# Patient Record
Sex: Female | Born: 1950 | Race: Black or African American | Hispanic: No | Marital: Single | Smoking: Former smoker
Health system: Southern US, Community
[De-identification: ages and names within clinical notes are randomized; demographics above are authoritative.]

## PROBLEM LIST (undated history)

## (undated) ENCOUNTER — Emergency Department (HOSPITAL_COMMUNITY): Discharge status: Absent without leave | Payer: Medicare Other

## (undated) DIAGNOSIS — E119 Type 2 diabetes mellitus without complications: Secondary | ICD-10-CM

## (undated) DIAGNOSIS — J45909 Unspecified asthma, uncomplicated: Secondary | ICD-10-CM

## (undated) DIAGNOSIS — E040 Nontoxic diffuse goiter: Secondary | ICD-10-CM

## (undated) DIAGNOSIS — I639 Cerebral infarction, unspecified: Secondary | ICD-10-CM

## (undated) DIAGNOSIS — E0590 Thyrotoxicosis, unspecified without thyrotoxic crisis or storm: Secondary | ICD-10-CM

## (undated) DIAGNOSIS — M1990 Unspecified osteoarthritis, unspecified site: Secondary | ICD-10-CM

## (undated) DIAGNOSIS — I10 Essential (primary) hypertension: Secondary | ICD-10-CM

## (undated) DIAGNOSIS — F0150 Vascular dementia without behavioral disturbance: Secondary | ICD-10-CM

## (undated) DIAGNOSIS — E049 Nontoxic goiter, unspecified: Secondary | ICD-10-CM

## (undated) DIAGNOSIS — E662 Morbid (severe) obesity with alveolar hypoventilation: Principal | ICD-10-CM

## (undated) DIAGNOSIS — E785 Hyperlipidemia, unspecified: Secondary | ICD-10-CM

## (undated) HISTORY — DX: Unspecified osteoarthritis, unspecified site: M19.90

## (undated) HISTORY — DX: Thyrotoxicosis, unspecified without thyrotoxic crisis or storm: E05.90

## (undated) HISTORY — DX: Nontoxic goiter, unspecified: E04.9

## (undated) HISTORY — DX: Nontoxic diffuse goiter: E04.0

## (undated) HISTORY — DX: Unspecified asthma, uncomplicated: J45.909

## (undated) HISTORY — DX: Essential (primary) hypertension: I10

## (undated) HISTORY — DX: Cerebral infarction, unspecified: I63.9

## (undated) HISTORY — DX: Morbid (severe) obesity with alveolar hypoventilation: E66.2

## (undated) HISTORY — PX: VENTRAL HERNIA REPAIR: SHX424

## (undated) HISTORY — DX: Type 2 diabetes mellitus without complications: E11.9

## (undated) HISTORY — DX: Hyperlipidemia, unspecified: E78.5

---

## 2009-05-22 ENCOUNTER — Inpatient Hospital Stay (HOSPITAL_COMMUNITY): Admission: EM | Admit: 2009-05-22 | Discharge: 2009-05-29 | Payer: Self-pay | Admitting: Emergency Medicine

## 2009-05-22 ENCOUNTER — Encounter (INDEPENDENT_AMBULATORY_CARE_PROVIDER_SITE_OTHER): Payer: Self-pay | Admitting: Surgery

## 2009-05-22 ENCOUNTER — Ambulatory Visit: Payer: Self-pay | Admitting: Cardiology

## 2009-05-22 ENCOUNTER — Ambulatory Visit: Payer: Self-pay | Admitting: Family Medicine

## 2009-05-23 ENCOUNTER — Encounter: Payer: Self-pay | Admitting: Family Medicine

## 2009-06-28 ENCOUNTER — Ambulatory Visit: Payer: Self-pay | Admitting: Internal Medicine

## 2009-06-28 DIAGNOSIS — R739 Hyperglycemia, unspecified: Secondary | ICD-10-CM

## 2009-06-28 DIAGNOSIS — E663 Overweight: Secondary | ICD-10-CM | POA: Insufficient documentation

## 2009-06-28 DIAGNOSIS — E0590 Thyrotoxicosis, unspecified without thyrotoxic crisis or storm: Secondary | ICD-10-CM | POA: Insufficient documentation

## 2009-06-28 DIAGNOSIS — R0602 Shortness of breath: Secondary | ICD-10-CM | POA: Insufficient documentation

## 2009-06-28 DIAGNOSIS — I2510 Atherosclerotic heart disease of native coronary artery without angina pectoris: Secondary | ICD-10-CM | POA: Insufficient documentation

## 2009-06-28 DIAGNOSIS — I10 Essential (primary) hypertension: Secondary | ICD-10-CM

## 2009-06-28 DIAGNOSIS — E785 Hyperlipidemia, unspecified: Secondary | ICD-10-CM | POA: Insufficient documentation

## 2009-07-03 ENCOUNTER — Inpatient Hospital Stay (HOSPITAL_COMMUNITY): Admission: EM | Admit: 2009-07-03 | Discharge: 2009-07-09 | Payer: Self-pay | Admitting: Emergency Medicine

## 2009-07-04 ENCOUNTER — Encounter (INDEPENDENT_AMBULATORY_CARE_PROVIDER_SITE_OTHER): Payer: Self-pay | Admitting: Internal Medicine

## 2009-07-05 ENCOUNTER — Encounter (INDEPENDENT_AMBULATORY_CARE_PROVIDER_SITE_OTHER): Payer: Self-pay | Admitting: Internal Medicine

## 2009-07-06 ENCOUNTER — Ambulatory Visit: Payer: Self-pay | Admitting: Physical Medicine & Rehabilitation

## 2009-07-06 ENCOUNTER — Encounter: Admission: RE | Admit: 2009-07-06 | Discharge: 2009-07-06 | Payer: Self-pay | Admitting: Internal Medicine

## 2009-07-09 ENCOUNTER — Inpatient Hospital Stay (HOSPITAL_COMMUNITY)
Admission: RE | Admit: 2009-07-09 | Discharge: 2009-07-21 | Payer: Self-pay | Admitting: Physical Medicine & Rehabilitation

## 2009-07-10 ENCOUNTER — Ambulatory Visit: Payer: Self-pay | Admitting: Physical Medicine & Rehabilitation

## 2009-08-16 ENCOUNTER — Encounter
Admission: RE | Admit: 2009-08-16 | Discharge: 2009-09-22 | Payer: Self-pay | Admitting: Physical Medicine & Rehabilitation

## 2009-09-21 ENCOUNTER — Ambulatory Visit: Payer: Self-pay | Admitting: Physical Medicine & Rehabilitation

## 2009-09-29 ENCOUNTER — Encounter
Admission: RE | Admit: 2009-09-29 | Discharge: 2009-11-29 | Payer: Self-pay | Admitting: Physical Medicine & Rehabilitation

## 2009-11-11 ENCOUNTER — Encounter
Admission: RE | Admit: 2009-11-11 | Discharge: 2009-11-16 | Payer: Self-pay | Admitting: Physical Medicine & Rehabilitation

## 2009-11-16 ENCOUNTER — Ambulatory Visit: Payer: Self-pay | Admitting: Physical Medicine & Rehabilitation

## 2010-02-04 ENCOUNTER — Encounter
Admission: RE | Admit: 2010-02-04 | Discharge: 2010-02-04 | Payer: Medicare Other | Admitting: Physical Medicine & Rehabilitation

## 2010-03-25 ENCOUNTER — Encounter: Payer: Self-pay | Admitting: Internal Medicine

## 2010-06-24 ENCOUNTER — Inpatient Hospital Stay (HOSPITAL_COMMUNITY)
Admission: EM | Admit: 2010-06-24 | Discharge: 2010-07-03 | Payer: Self-pay | Source: Home / Self Care | Admitting: Emergency Medicine

## 2010-09-06 ENCOUNTER — Ambulatory Visit (HOSPITAL_COMMUNITY)
Admission: RE | Admit: 2010-09-06 | Discharge: 2010-09-06 | Payer: Self-pay | Source: Home / Self Care | Attending: Obstetrics | Admitting: Obstetrics

## 2010-09-09 ENCOUNTER — Emergency Department (HOSPITAL_COMMUNITY)
Admission: EM | Admit: 2010-09-09 | Discharge: 2010-09-10 | Disposition: A | Discharge status: Other Acute Inpatient Hospital | Payer: Self-pay | Source: Home / Self Care | Admitting: Emergency Medicine

## 2010-09-10 ENCOUNTER — Inpatient Hospital Stay (HOSPITAL_COMMUNITY)
Admission: AD | Admit: 2010-09-10 | Discharge: 2010-09-13 | Payer: Self-pay | Attending: Obstetrics | Admitting: Obstetrics

## 2010-09-28 ENCOUNTER — Ambulatory Visit
Admission: RE | Admit: 2010-09-28 | Discharge: 2010-09-28 | Payer: Self-pay | Source: Home / Self Care | Attending: Gynecologic Oncology | Admitting: Gynecologic Oncology

## 2010-10-17 NOTE — Consult Note (Signed)
NAME:  Angelica Beck, Angelica Beck NO.:  [id number]  MEDICAL RECORD NO.:  [id number]          PATIENT TYPE:  INP  LOCATION:  3712                         FACILITY:  [facility]  PHYSICIAN:  Wilmon Arms. Corliss Skains, M.D. DATE OF BIRTH:  1950-09-29  DATE OF CONSULTATION:  06/24/2010 DATE OF DISCHARGE:                                CONSULTATION   REASON FOR EVALUATION:  Partial small bowel obstruction.  HISTORY OF PRESENT ILLNESS:  This is a 60 year old female with significant medical comorbidities who is status post emergent repair of incarcerated umbilical hernia on May 22, 2009, by Dr. Harriette Bouillon. The hernia was repaired with a 10- x 10-cm sheet of Proceed mesh.  The patient has had a 1-day history of abdominal pain in her right side associated with nausea and vomiting.  Last bowel movement was 2 days ago.  The patient states that frequently she goes 2 or 3 days without a bowel movement.  She does report flatus today.  She did take a laxative. Currently, she does not feel any abdominal pain or distention.  She does have some nausea but no further vomiting.  She was evaluated in the emergency department and a CT scan shows a possible partial small bowel obstruction but also a new right adnexal cystic mass.  This is being further evaluated.  PAST MEDICAL HISTORY: 1. Diabetes type 2. 2. Coronary artery disease. 3. History of stroke. 4. Congestive heart failure. 5. Morbid obesity. 6. Hypertension. 7. Obstructive sleep apnea. 8. Hyperthyroidism. 9  Chronic back pain. 1. Frequent urinary tract infections.  PAST SURGICAL HISTORY: 1. Abdominal hysterectomy. 2. Emergent repair of umbilical hernia. 3. Coronary stent.  SOCIAL HISTORY:  Nonsmoker, nondrinker.  ALLERGIES:  PENICILLIN.  MEDICATIONS:  Vitamins, Lantus, Vicodin, simvastatin, propylthiouracil, Prevacid, Plavix, metoprolol, iron, enalapril, Enablex, baby aspirin, and amlodipine.  PHYSICAL EXAMINATION:  VITAL  SIGNS:  Temperature 98.8, blood pressure 158/84, pulse 110, respirations 20, sats 100% on room air. GENERAL:  This is a morbidly obese female in no apparent distress. HEENT:  EOMI.  Sclerae icteric. LUNGS:  Clear.  Normal respiratory effort. HEART:  Regular rate and rhythm.  No murmur. ABDOMEN:  Soft, nontender.  Positive bowel sounds.  No distention. Healed umbilical midline incision with no palpable ventral hernia.  LABORATORY DATA:  White count 30.4, hemoglobin 12.5.  Potassium 3.2. Total bilirubin 0.9.  Urinalysis shows moderate bilirubin, positive ketones, moderate blood, positive nitrates, positive leukocytes.  CT scan of the abdomen and pelvis shows moderately dilated proximal small bowel with some air-fluid levels.  There is air stool within the colon. There is a 3.5- x 7-cm cystic area in the right adnexa.  IMPRESSION: 1. Hypokalemia. 2. Leukocytosis. 3. Probable urinary tract infection. 4. Partial small bowel obstruction but the patient is having flatus. 5. Adnexal mass.  RECOMMENDATIONS:  Bowel rest and p.o. IV hydration.  I would not place an NG tube at this time.  If the patient continues to vomit, I would place an NG tube.  We will treat her urinary tract infection with antibiotics.  Replete her K.  Monitor her white blood cell count. Repeat abdominal series in the a.m.  Pelvic ultrasound to further evaluate her adnexa.  Gynecology may need to be involved.     Wilmon Arms. Corliss Skains, M.D.   ______________________________ Wilmon Arms. Corliss Skains, M.D.    MKT/MEDQ  D:  06/24/2010  T:  06/25/2010  Job:  [id number]  Electronically Signed by Manus Rudd M.D. on 10/17/2010 11:54:40 AM

## 2010-10-25 NOTE — Letter (Addendum)
Summary: Generic Letter  Architectural technologist, Main Office  [address]   [place], [place] [address]   Phone: [phone]  Fax: [phone]    03/25/2010  BERNETHA ANSCHUTZ [address] [place], [place]  [address]  Dear Ms. Kuc, Please be advised that we still have not received the records from [place] concerning the cardiac stent placement procedures done in 2005 and 2009. Please follow up on this in order for [place] to see you in our clinic for continued cardiac care. Our fax # is [phone] and phone # is [phone].           Sincerely,   Layne Benton, RN, BSN  ( Dr.Litzi Binning Tenny Craw )

## 2010-12-05 LAB — URINALYSIS, ROUTINE W REFLEX MICROSCOPIC
Glucose, UA: 100 mg/dL — AB
Ketones, ur: 15 mg/dL — AB
Nitrite: POSITIVE — AB
Protein, ur: 100 mg/dL — AB
Specific Gravity, Urine: 1.02 (ref 1.005–1.030)
Urobilinogen, UA: 1 mg/dL (ref 0.0–1.0)
pH: 5.5 (ref 5.0–8.0)

## 2010-12-05 LAB — CULTURE, BLOOD (ROUTINE X 2)
Culture  Setup Time: 201112170338
Culture  Setup Time: 201112170338
Culture: NO GROWTH

## 2010-12-05 LAB — COMPREHENSIVE METABOLIC PANEL
Albumin: 2.6 g/dL — ABNORMAL LOW (ref 3.5–5.2)
BUN: 7 mg/dL (ref 6–23)
Creatinine, Ser: 0.99 mg/dL (ref 0.4–1.2)
GFR calc Af Amer: >60 mL/min (ref >60)
Potassium: 3.3 mEq/L — ABNORMAL LOW (ref 3.5–5.1)
Total Protein: 8.2 g/dL (ref 6.0–8.3)

## 2010-12-05 LAB — DIFFERENTIAL
Lymphocytes Relative: 15 % (ref 12–46)
Monocytes Absolute: 1.4 10*3/uL — ABNORMAL HIGH (ref 0.1–1.0)
Monocytes Relative: 8 % (ref 3–12)
Neutro Abs: 13.8 10*3/uL — ABNORMAL HIGH (ref 1.7–7.7)

## 2010-12-05 LAB — URINE CULTURE: Colony Count: >=100000

## 2010-12-05 LAB — CBC
MCH: 25.3 pg — ABNORMAL LOW (ref 26.0–34.0)
MCV: 78.9 fL (ref 78.0–100.0)
Platelets: 433 10*3/uL — ABNORMAL HIGH (ref 150–400)
RDW: 15 % (ref 11.5–15.5)
WBC: 18.1 10*3/uL — ABNORMAL HIGH (ref 4.0–10.5)

## 2010-12-05 LAB — GLUCOSE, CAPILLARY
Glucose-Capillary: 190 mg/dL — ABNORMAL HIGH (ref 70–99)
Glucose-Capillary: 200 mg/dL — ABNORMAL HIGH (ref 70–99)
Glucose-Capillary: 113 mg/dL — ABNORMAL HIGH (ref 70–99)
Glucose-Capillary: 124 mg/dL — ABNORMAL HIGH (ref 70–99)
Glucose-Capillary: 130 mg/dL — ABNORMAL HIGH (ref 70–99)
Glucose-Capillary: 159 mg/dL — ABNORMAL HIGH (ref 70–99)
Glucose-Capillary: 191 mg/dL — ABNORMAL HIGH (ref 70–99)
Glucose-Capillary: 83 mg/dL (ref 70–99)
Glucose-Capillary: 96 mg/dL (ref 70–99)

## 2010-12-05 LAB — URINE MICROSCOPIC-ADD ON

## 2010-12-05 LAB — GENTAMICIN LEVEL, RANDOM: Gentamicin Rm: 6.9 ug/mL

## 2010-12-06 LAB — CREATININE, SERUM
Creatinine, Ser: 0.88 mg/dL (ref 0.4–1.2)
GFR calc Af Amer: >60 mL/min (ref >60)
GFR calc non Af Amer: >60 mL/min (ref >60)

## 2010-12-08 LAB — DIFFERENTIAL
Basophils Absolute: 0.1 10*3/uL (ref 0.0–0.1)
Basophils Absolute: 0.1 10*3/uL (ref 0.0–0.1)
Basophils Relative: 0 % (ref 0–1)
Basophils Relative: 0 % (ref 0–1)
Basophils Relative: 0 % (ref 0–1)
Eosinophils Absolute: 0.4 10*3/uL (ref 0.0–0.7)
Eosinophils Absolute: 0.4 10*3/uL (ref 0.0–0.7)
Eosinophils Absolute: 0.4 10*3/uL (ref 0.0–0.7)
Eosinophils Absolute: 0.4 10*3/uL (ref 0.0–0.7)
Eosinophils Absolute: 0 10*3/uL (ref 0.0–0.7)
Eosinophils Relative: 3 % (ref 0–5)
Lymphocytes Relative: 16 % (ref 12–46)
Lymphs Abs: 2.2 10*3/uL (ref 0.7–4.0)
Lymphs Abs: 2.2 10*3/uL (ref 0.7–4.0)
Monocytes Absolute: 1.3 10*3/uL — ABNORMAL HIGH (ref 0.1–1.0)
Monocytes Absolute: 1.3 10*3/uL — ABNORMAL HIGH (ref 0.1–1.0)
Monocytes Absolute: 2.1 10*3/uL — ABNORMAL HIGH (ref 0.1–1.0)
Monocytes Relative: 10 % (ref 3–12)
Monocytes Relative: 11 % (ref 3–12)
Monocytes Relative: 9 % (ref 3–12)
Neutro Abs: 9.2 10*3/uL — ABNORMAL HIGH (ref 1.7–7.7)
Neutro Abs: 9.5 10*3/uL — ABNORMAL HIGH (ref 1.7–7.7)
Neutrophils Relative %: 72 % (ref 43–77)
Neutrophils Relative %: 72 % (ref 43–77)
Neutrophils Relative %: 88 % — ABNORMAL HIGH (ref 43–77)

## 2010-12-08 LAB — CBC
HCT: 31 % — ABNORMAL LOW (ref 36.0–46.0)
HCT: 32.5 % — ABNORMAL LOW (ref 36.0–46.0)
HCT: 32.8 % — ABNORMAL LOW (ref 36.0–46.0)
HCT: 34.8 % — ABNORMAL LOW (ref 36.0–46.0)
HCT: 37.4 % (ref 36.0–46.0)
Hemoglobin: 10.3 g/dL — ABNORMAL LOW (ref 12.0–15.0)
Hemoglobin: 10.4 g/dL — ABNORMAL LOW (ref 12.0–15.0)
Hemoglobin: 10.5 g/dL — ABNORMAL LOW (ref 12.0–15.0)
Hemoglobin: 11.3 g/dL — ABNORMAL LOW (ref 12.0–15.0)
Hemoglobin: 9.7 g/dL — ABNORMAL LOW (ref 12.0–15.0)
Hemoglobin: 9.8 g/dL — ABNORMAL LOW (ref 12.0–15.0)
MCH: 25.8 pg — ABNORMAL LOW (ref 26.0–34.0)
MCH: 26.1 pg (ref 26.0–34.0)
MCH: 26.1 pg (ref 26.0–34.0)
MCH: 26.1 pg (ref 26.0–34.0)
MCH: 26.2 pg (ref 26.0–34.0)
MCH: 26.3 pg (ref 26.0–34.0)
MCHC: 31.5 g/dL (ref 30.0–36.0)
MCHC: 31.6 g/dL (ref 30.0–36.0)
MCHC: 31.7 g/dL (ref 30.0–36.0)
MCHC: 32 g/dL (ref 30.0–36.0)
MCHC: 32 g/dL (ref 30.0–36.0)
MCHC: 32.5 g/dL (ref 30.0–36.0)
MCV: 79.6 fL (ref 78.0–100.0)
MCV: 80.6 fL (ref 78.0–100.0)
MCV: 82.5 fL (ref 78.0–100.0)
MCV: 81.1 fL (ref 78.0–100.0)
Platelets: 269 10*3/uL (ref 150–400)
Platelets: 393 10*3/uL (ref 150–400)
RBC: 3.8 MIL/uL — ABNORMAL LOW (ref 3.87–5.11)
RBC: 3.94 MIL/uL (ref 3.87–5.11)
RBC: 3.99 MIL/uL (ref 3.87–5.11)
RBC: 4.14 MIL/uL (ref 3.87–5.11)
RBC: 4.61 MIL/uL (ref 3.87–5.11)
RDW: 14.5 % (ref 11.5–15.5)
RDW: 14.5 % (ref 11.5–15.5)
WBC: 13.2 10*3/uL — ABNORMAL HIGH (ref 4.0–10.5)
WBC: 13.7 10*3/uL — ABNORMAL HIGH (ref 4.0–10.5)
WBC: 13.8 10*3/uL — ABNORMAL HIGH (ref 4.0–10.5)
WBC: 20.5 10*3/uL — ABNORMAL HIGH (ref 4.0–10.5)
WBC: 27.8 10*3/uL — ABNORMAL HIGH (ref 4.0–10.5)
WBC: 30.4 10*3/uL — ABNORMAL HIGH (ref 4.0–10.5)

## 2010-12-08 LAB — GLUCOSE, CAPILLARY
Glucose-Capillary: 120 mg/dL — ABNORMAL HIGH (ref 70–99)
Glucose-Capillary: 121 mg/dL — ABNORMAL HIGH (ref 70–99)
Glucose-Capillary: 123 mg/dL — ABNORMAL HIGH (ref 70–99)
Glucose-Capillary: 126 mg/dL — ABNORMAL HIGH (ref 70–99)
Glucose-Capillary: 127 mg/dL — ABNORMAL HIGH (ref 70–99)
Glucose-Capillary: 127 mg/dL — ABNORMAL HIGH (ref 70–99)
Glucose-Capillary: 129 mg/dL — ABNORMAL HIGH (ref 70–99)
Glucose-Capillary: 131 mg/dL — ABNORMAL HIGH (ref 70–99)
Glucose-Capillary: 132 mg/dL — ABNORMAL HIGH (ref 70–99)
Glucose-Capillary: 132 mg/dL — ABNORMAL HIGH (ref 70–99)
Glucose-Capillary: 133 mg/dL — ABNORMAL HIGH (ref 70–99)
Glucose-Capillary: 137 mg/dL — ABNORMAL HIGH (ref 70–99)
Glucose-Capillary: 150 mg/dL — ABNORMAL HIGH (ref 70–99)
Glucose-Capillary: 154 mg/dL — ABNORMAL HIGH (ref 70–99)
Glucose-Capillary: 157 mg/dL — ABNORMAL HIGH (ref 70–99)
Glucose-Capillary: 157 mg/dL — ABNORMAL HIGH (ref 70–99)
Glucose-Capillary: 167 mg/dL — ABNORMAL HIGH (ref 70–99)
Glucose-Capillary: 179 mg/dL — ABNORMAL HIGH (ref 70–99)
Glucose-Capillary: 191 mg/dL — ABNORMAL HIGH (ref 70–99)
Glucose-Capillary: 192 mg/dL — ABNORMAL HIGH (ref 70–99)
Glucose-Capillary: 197 mg/dL — ABNORMAL HIGH (ref 70–99)
Glucose-Capillary: 200 mg/dL — ABNORMAL HIGH (ref 70–99)
Glucose-Capillary: 209 mg/dL — ABNORMAL HIGH (ref 70–99)
Glucose-Capillary: 221 mg/dL — ABNORMAL HIGH (ref 70–99)
Glucose-Capillary: 224 mg/dL — ABNORMAL HIGH (ref 70–99)
Glucose-Capillary: 312 mg/dL — ABNORMAL HIGH (ref 70–99)
Glucose-Capillary: 177 mg/dL — ABNORMAL HIGH (ref 70–99)

## 2010-12-08 LAB — CARDIAC PANEL(CRET KIN+CKTOT+MB+TROPI)
CK, MB: 2.3 ng/mL (ref 0.3–4.0)
CK, MB: 3.2 ng/mL (ref 0.3–4.0)
CK, MB: 6.4 ng/mL (ref 0.3–4.0)
Total CK: 79 U/L (ref 7–177)
Troponin I: 0.05 ng/mL (ref 0.00–0.06)

## 2010-12-08 LAB — CULTURE, BLOOD (ROUTINE X 2)
Culture  Setup Time: 201110010423
Culture: NO GROWTH

## 2010-12-08 LAB — URINE CULTURE

## 2010-12-08 LAB — BASIC METABOLIC PANEL
BUN: 10 mg/dL (ref 6–23)
BUN: 2 mg/dL — ABNORMAL LOW (ref 6–23)
CO2: 28 mEq/L (ref 19–32)
CO2: 29 mEq/L (ref 19–32)
Calcium: 8.4 mg/dL (ref 8.4–10.5)
Calcium: 8.5 mg/dL (ref 8.4–10.5)
Calcium: 8.9 mg/dL (ref 8.4–10.5)
Calcium: 9 mg/dL (ref 8.4–10.5)
Chloride: 99 mEq/L (ref 96–112)
Chloride: 101 mEq/L (ref 96–112)
Creatinine, Ser: 0.66 mg/dL (ref 0.4–1.2)
Creatinine, Ser: 0.72 mg/dL (ref 0.4–1.2)
GFR calc Af Amer: >60 mL/min (ref >60)
GFR calc Af Amer: >60 mL/min (ref >60)
GFR calc Af Amer: >60 mL/min (ref >60)
GFR calc non Af Amer: >60 mL/min (ref >60)
GFR calc non Af Amer: >60 mL/min (ref >60)
GFR calc non Af Amer: >60 mL/min (ref >60)
GFR calc non Af Amer: >60 mL/min (ref >60)
Glucose, Bld: 121 mg/dL — ABNORMAL HIGH (ref 70–99)
Glucose, Bld: 131 mg/dL — ABNORMAL HIGH (ref 70–99)
Glucose, Bld: 133 mg/dL — ABNORMAL HIGH (ref 70–99)
Glucose, Bld: 153 mg/dL — ABNORMAL HIGH (ref 70–99)
Potassium: 3.7 mEq/L (ref 3.5–5.1)
Potassium: 3.8 mEq/L (ref 3.5–5.1)
Potassium: 4 mEq/L (ref 3.5–5.1)
Potassium: 3.3 mEq/L — ABNORMAL LOW (ref 3.5–5.1)
Sodium: 136 mEq/L (ref 135–145)
Sodium: 138 mEq/L (ref 135–145)
Sodium: 138 mEq/L (ref 135–145)
Sodium: 136 mEq/L (ref 135–145)

## 2010-12-08 LAB — COMPREHENSIVE METABOLIC PANEL
ALT: 11 U/L (ref 0–35)
Alkaline Phosphatase: 109 U/L (ref 39–117)
Alkaline Phosphatase: 121 U/L — ABNORMAL HIGH (ref 39–117)
BUN: 16 mg/dL (ref 6–23)
CO2: 26 mEq/L (ref 19–32)
Chloride: 99 mEq/L (ref 96–112)
GFR calc non Af Amer: >60 mL/min (ref >60)
GFR calc non Af Amer: 51 mL/min — ABNORMAL LOW (ref >60)
Glucose, Bld: 161 mg/dL — ABNORMAL HIGH (ref 70–99)
Glucose, Bld: 191 mg/dL — ABNORMAL HIGH (ref 70–99)
Potassium: 3.3 mEq/L — ABNORMAL LOW (ref 3.5–5.1)
Potassium: 3.2 mEq/L — ABNORMAL LOW (ref 3.5–5.1)
Sodium: 135 mEq/L (ref 135–145)
Total Bilirubin: 0.2 mg/dL — ABNORMAL LOW (ref 0.3–1.2)
Total Bilirubin: 0.9 mg/dL (ref 0.3–1.2)

## 2010-12-08 LAB — URINALYSIS, ROUTINE W REFLEX MICROSCOPIC
Glucose, UA: NEGATIVE mg/dL
Ketones, ur: 15 mg/dL — AB
Nitrite: POSITIVE — AB
Protein, ur: 100 mg/dL — AB

## 2010-12-08 LAB — GC/CHLAMYDIA PROBE AMP, URINE: GC Probe Amp, Urine: NEGATIVE

## 2010-12-08 LAB — CK TOTAL AND CKMB (NOT AT ARMC)
CK, MB: 3.2 ng/mL (ref 0.3–4.0)
Relative Index: 2.9 — ABNORMAL HIGH (ref 0.0–2.5)
Total CK: 111 U/L (ref 7–177)

## 2010-12-08 LAB — HEMOGLOBIN A1C: Mean Plasma Glucose: 174 mg/dL — ABNORMAL HIGH (ref <117)

## 2010-12-08 LAB — LIPASE, BLOOD: Lipase: 16 U/L (ref 11–59)

## 2010-12-08 LAB — MAGNESIUM: Magnesium: 2.3 mg/dL (ref 1.5–2.5)

## 2010-12-08 LAB — URINE MICROSCOPIC-ADD ON

## 2010-12-29 LAB — GLUCOSE, CAPILLARY
Glucose-Capillary: 103 mg/dL — ABNORMAL HIGH (ref 70–99)
Glucose-Capillary: 105 mg/dL — ABNORMAL HIGH (ref 70–99)
Glucose-Capillary: 108 mg/dL — ABNORMAL HIGH (ref 70–99)
Glucose-Capillary: 123 mg/dL — ABNORMAL HIGH (ref 70–99)
Glucose-Capillary: 141 mg/dL — ABNORMAL HIGH (ref 70–99)
Glucose-Capillary: 141 mg/dL — ABNORMAL HIGH (ref 70–99)
Glucose-Capillary: 142 mg/dL — ABNORMAL HIGH (ref 70–99)
Glucose-Capillary: 142 mg/dL — ABNORMAL HIGH (ref 70–99)
Glucose-Capillary: 150 mg/dL — ABNORMAL HIGH (ref 70–99)
Glucose-Capillary: 151 mg/dL — ABNORMAL HIGH (ref 70–99)
Glucose-Capillary: 151 mg/dL — ABNORMAL HIGH (ref 70–99)
Glucose-Capillary: 152 mg/dL — ABNORMAL HIGH (ref 70–99)
Glucose-Capillary: 159 mg/dL — ABNORMAL HIGH (ref 70–99)
Glucose-Capillary: 161 mg/dL — ABNORMAL HIGH (ref 70–99)
Glucose-Capillary: 161 mg/dL — ABNORMAL HIGH (ref 70–99)
Glucose-Capillary: 165 mg/dL — ABNORMAL HIGH (ref 70–99)
Glucose-Capillary: 166 mg/dL — ABNORMAL HIGH (ref 70–99)
Glucose-Capillary: 172 mg/dL — ABNORMAL HIGH (ref 70–99)
Glucose-Capillary: 175 mg/dL — ABNORMAL HIGH (ref 70–99)
Glucose-Capillary: 177 mg/dL — ABNORMAL HIGH (ref 70–99)
Glucose-Capillary: 185 mg/dL — ABNORMAL HIGH (ref 70–99)
Glucose-Capillary: 201 mg/dL — ABNORMAL HIGH (ref 70–99)
Glucose-Capillary: 74 mg/dL (ref 70–99)
Glucose-Capillary: 76 mg/dL (ref 70–99)
Glucose-Capillary: 84 mg/dL (ref 70–99)
Glucose-Capillary: 118 mg/dL — ABNORMAL HIGH (ref 70–99)
Glucose-Capillary: 119 mg/dL — ABNORMAL HIGH (ref 70–99)
Glucose-Capillary: 125 mg/dL — ABNORMAL HIGH (ref 70–99)
Glucose-Capillary: 139 mg/dL — ABNORMAL HIGH (ref 70–99)
Glucose-Capillary: 139 mg/dL — ABNORMAL HIGH (ref 70–99)
Glucose-Capillary: 140 mg/dL — ABNORMAL HIGH (ref 70–99)
Glucose-Capillary: 141 mg/dL — ABNORMAL HIGH (ref 70–99)
Glucose-Capillary: 145 mg/dL — ABNORMAL HIGH (ref 70–99)
Glucose-Capillary: 147 mg/dL — ABNORMAL HIGH (ref 70–99)
Glucose-Capillary: 153 mg/dL — ABNORMAL HIGH (ref 70–99)
Glucose-Capillary: 153 mg/dL — ABNORMAL HIGH (ref 70–99)
Glucose-Capillary: 154 mg/dL — ABNORMAL HIGH (ref 70–99)
Glucose-Capillary: 161 mg/dL — ABNORMAL HIGH (ref 70–99)
Glucose-Capillary: 182 mg/dL — ABNORMAL HIGH (ref 70–99)
Glucose-Capillary: 185 mg/dL — ABNORMAL HIGH (ref 70–99)
Glucose-Capillary: 82 mg/dL (ref 70–99)

## 2010-12-29 LAB — COMPREHENSIVE METABOLIC PANEL
AST: 37 U/L (ref 0–37)
AST: 27 U/L (ref 0–37)
Albumin: 3.7 g/dL (ref 3.5–5.2)
Alkaline Phosphatase: 78 U/L (ref 39–117)
BUN: 5 mg/dL — ABNORMAL LOW (ref 6–23)
CO2: 27 mEq/L (ref 19–32)
Calcium: 9.1 mg/dL (ref 8.4–10.5)
Chloride: 102 mEq/L (ref 96–112)
Chloride: 104 mEq/L (ref 96–112)
Creatinine, Ser: 0.89 mg/dL (ref 0.4–1.2)
Creatinine, Ser: 0.58 mg/dL (ref 0.4–1.2)
GFR calc Af Amer: >60 mL/min (ref >60)
GFR calc Af Amer: >60 mL/min (ref >60)
GFR calc non Af Amer: >60 mL/min (ref >60)
Potassium: 4.3 mEq/L (ref 3.5–5.1)
Total Bilirubin: 1.2 mg/dL (ref 0.3–1.2)
Total Bilirubin: 0.5 mg/dL (ref 0.3–1.2)
Total Protein: 7.9 g/dL (ref 6.0–8.3)

## 2010-12-29 LAB — CBC
HCT: 36.8 % (ref 36.0–46.0)
HCT: 34.5 % — ABNORMAL LOW (ref 36.0–46.0)
HCT: 38.7 % (ref 36.0–46.0)
Hemoglobin: 11.4 g/dL — ABNORMAL LOW (ref 12.0–15.0)
MCHC: 32.5 g/dL (ref 30.0–36.0)
MCV: 82.6 fL (ref 78.0–100.0)
MCV: 82.5 fL (ref 78.0–100.0)
Platelets: 191 10*3/uL (ref 150–400)
RBC: 4.46 MIL/uL (ref 3.87–5.11)
RBC: 4.18 MIL/uL (ref 3.87–5.11)
RDW: 15.9 % — ABNORMAL HIGH (ref 11.5–15.5)
RDW: 16.2 % — ABNORMAL HIGH (ref 11.5–15.5)
WBC: 8.4 10*3/uL (ref 4.0–10.5)

## 2010-12-29 LAB — LIPID PANEL
Cholesterol: 168 mg/dL (ref 0–200)
HDL: 38 mg/dL — ABNORMAL LOW (ref >39)
LDL Cholesterol: 116 mg/dL — ABNORMAL HIGH (ref 0–99)
Total CHOL/HDL Ratio: 4.4 RATIO
Triglycerides: 72 mg/dL (ref <150)

## 2010-12-29 LAB — BASIC METABOLIC PANEL
BUN: 9 mg/dL (ref 6–23)
CO2: 24 mEq/L (ref 19–32)
CO2: 24 mEq/L (ref 19–32)
CO2: 25 mEq/L (ref 19–32)
Calcium: 9.4 mg/dL (ref 8.4–10.5)
Calcium: 9.4 mg/dL (ref 8.4–10.5)
Chloride: 104 mEq/L (ref 96–112)
Creatinine, Ser: 0.73 mg/dL (ref 0.4–1.2)
GFR calc Af Amer: >60 mL/min (ref >60)
GFR calc non Af Amer: >60 mL/min (ref >60)
GFR calc non Af Amer: >60 mL/min (ref >60)
Glucose, Bld: 148 mg/dL — ABNORMAL HIGH (ref 70–99)
Glucose, Bld: 193 mg/dL — ABNORMAL HIGH (ref 70–99)
Glucose, Bld: 232 mg/dL — ABNORMAL HIGH (ref 70–99)
Potassium: 2.9 mEq/L — ABNORMAL LOW (ref 3.5–5.1)
Sodium: 137 mEq/L (ref 135–145)
Sodium: 138 mEq/L (ref 135–145)

## 2010-12-29 LAB — POCT CARDIAC MARKERS
CKMB, poc: <1 ng/mL — ABNORMAL LOW (ref 1.0–8.0)
Myoglobin, poc: 61.7 ng/mL (ref 12–200)

## 2010-12-29 LAB — DIFFERENTIAL
Basophils Absolute: 0 10*3/uL (ref 0.0–0.1)
Basophils Absolute: 0 10*3/uL (ref 0.0–0.1)
Basophils Absolute: 0 10*3/uL (ref 0.0–0.1)
Basophils Relative: 1 % (ref 0–1)
Eosinophils Absolute: 0.3 10*3/uL (ref 0.0–0.7)
Eosinophils Relative: 3 % (ref 0–5)
Eosinophils Relative: 2 % (ref 0–5)
Lymphocytes Relative: 32 % (ref 12–46)
Lymphocytes Relative: 27 % (ref 12–46)
Lymphocytes Relative: 28 % (ref 12–46)
Lymphs Abs: 1.8 10*3/uL (ref 0.7–4.0)
Lymphs Abs: 2.1 10*3/uL (ref 0.7–4.0)
Monocytes Absolute: 0.5 10*3/uL (ref 0.1–1.0)
Monocytes Absolute: 0.6 10*3/uL (ref 0.1–1.0)
Monocytes Relative: 8 % (ref 3–12)
Neutro Abs: 4 10*3/uL (ref 1.7–7.7)

## 2010-12-29 LAB — URINALYSIS, ROUTINE W REFLEX MICROSCOPIC
Bilirubin Urine: NEGATIVE
Bilirubin Urine: NEGATIVE
Glucose, UA: NEGATIVE mg/dL
Hgb urine dipstick: NEGATIVE
Hgb urine dipstick: NEGATIVE
Specific Gravity, Urine: 1.005 (ref 1.005–1.030)
Specific Gravity, Urine: 1.015 (ref 1.005–1.030)
pH: 5 (ref 5.0–8.0)
pH: 6.5 (ref 5.0–8.0)

## 2010-12-29 LAB — CK TOTAL AND CKMB (NOT AT ARMC)
CK, MB: 0.6 ng/mL (ref 0.3–4.0)
Total CK: 45 U/L (ref 7–177)

## 2010-12-29 LAB — URINE CULTURE

## 2010-12-29 LAB — HEMOGLOBIN A1C
Hgb A1c MFr Bld: 10.2 % — ABNORMAL HIGH (ref 4.6–6.1)
Mean Plasma Glucose: 246 mg/dL

## 2010-12-29 LAB — PROTIME-INR: Prothrombin Time: 13.5 seconds (ref 11.6–15.2)

## 2010-12-29 LAB — PHOSPHORUS: Phosphorus: 4 mg/dL (ref 2.3–4.6)

## 2010-12-29 LAB — HOMOCYSTEINE: Homocysteine: 12.7 umol/L (ref 4.0–15.4)

## 2010-12-30 LAB — BASIC METABOLIC PANEL
BUN: 3 mg/dL — ABNORMAL LOW (ref 6–23)
BUN: 7 mg/dL (ref 6–23)
CO2: 28 mEq/L (ref 19–32)
Calcium: 8.7 mg/dL (ref 8.4–10.5)
Chloride: 102 mEq/L (ref 96–112)
Chloride: 99 mEq/L (ref 96–112)
Creatinine, Ser: 0.7 mg/dL (ref 0.4–1.2)
Creatinine, Ser: 0.71 mg/dL (ref 0.4–1.2)
GFR calc Af Amer: >60 mL/min (ref >60)
GFR calc Af Amer: >60 mL/min (ref >60)
GFR calc non Af Amer: >60 mL/min (ref >60)
GFR calc non Af Amer: >60 mL/min (ref >60)
Glucose, Bld: 227 mg/dL — ABNORMAL HIGH (ref 70–99)
Glucose, Bld: 303 mg/dL — ABNORMAL HIGH (ref 70–99)
Potassium: 3.6 mEq/L (ref 3.5–5.1)
Potassium: 3.9 mEq/L (ref 3.5–5.1)
Sodium: 134 mEq/L — ABNORMAL LOW (ref 135–145)
Sodium: 136 mEq/L (ref 135–145)

## 2010-12-30 LAB — GLUCOSE, CAPILLARY
Glucose-Capillary: 212 mg/dL — ABNORMAL HIGH (ref 70–99)
Glucose-Capillary: 227 mg/dL — ABNORMAL HIGH (ref 70–99)
Glucose-Capillary: 251 mg/dL — ABNORMAL HIGH (ref 70–99)
Glucose-Capillary: 254 mg/dL — ABNORMAL HIGH (ref 70–99)
Glucose-Capillary: 266 mg/dL — ABNORMAL HIGH (ref 70–99)
Glucose-Capillary: 270 mg/dL — ABNORMAL HIGH (ref 70–99)
Glucose-Capillary: 287 mg/dL — ABNORMAL HIGH (ref 70–99)
Glucose-Capillary: 305 mg/dL — ABNORMAL HIGH (ref 70–99)

## 2010-12-30 LAB — CBC
HCT: 32.2 % — ABNORMAL LOW (ref 36.0–46.0)
Hemoglobin: 10.6 g/dL — ABNORMAL LOW (ref 12.0–15.0)
Platelets: 232 10*3/uL (ref 150–400)
RBC: 4.02 MIL/uL (ref 3.87–5.11)
WBC: 8.9 10*3/uL (ref 4.0–10.5)

## 2010-12-31 LAB — CBC
HCT: 33.2 % — ABNORMAL LOW (ref 36.0–46.0)
HCT: 37 % (ref 36.0–46.0)
Hemoglobin: 11.4 g/dL — ABNORMAL LOW (ref 12.0–15.0)
MCHC: 31.9 g/dL (ref 30.0–36.0)
MCHC: 32 g/dL (ref 30.0–36.0)
MCHC: 32.5 g/dL (ref 30.0–36.0)
MCV: 80.2 fL (ref 78.0–100.0)
MCV: 80.8 fL (ref 78.0–100.0)
MCV: 81.6 fL (ref 78.0–100.0)
Platelets: 234 10*3/uL (ref 150–400)
Platelets: 261 10*3/uL (ref 150–400)
RBC: 4.07 MIL/uL (ref 3.87–5.11)
RBC: 4.62 MIL/uL (ref 3.87–5.11)
RBC: 5.36 MIL/uL — ABNORMAL HIGH (ref 3.87–5.11)
RDW: 17.4 % — ABNORMAL HIGH (ref 11.5–15.5)
RDW: 18 % — ABNORMAL HIGH (ref 11.5–15.5)
WBC: 13 10*3/uL — ABNORMAL HIGH (ref 4.0–10.5)
WBC: 16.1 10*3/uL — ABNORMAL HIGH (ref 4.0–10.5)

## 2010-12-31 LAB — COMPREHENSIVE METABOLIC PANEL
ALT: 12 U/L (ref 0–35)
AST: 20 U/L (ref 0–37)
AST: 23 U/L (ref 0–37)
Albumin: 2.5 g/dL — ABNORMAL LOW (ref 3.5–5.2)
Alkaline Phosphatase: 74 U/L (ref 39–117)
Alkaline Phosphatase: 88 U/L (ref 39–117)
BUN: 12 mg/dL (ref 6–23)
BUN: 6 mg/dL (ref 6–23)
CO2: 22 mEq/L (ref 19–32)
CO2: 23 mEq/L (ref 19–32)
Calcium: 8.5 mg/dL (ref 8.4–10.5)
Calcium: 8.5 mg/dL (ref 8.4–10.5)
Chloride: 105 mEq/L (ref 96–112)
Chloride: 99 mEq/L (ref 96–112)
Creatinine, Ser: 0.73 mg/dL (ref 0.4–1.2)
Creatinine, Ser: 1.02 mg/dL (ref 0.4–1.2)
GFR calc Af Amer: >60 mL/min (ref >60)
GFR calc Af Amer: >60 mL/min (ref >60)
GFR calc Af Amer: >60 mL/min (ref >60)
GFR calc non Af Amer: 56 mL/min — ABNORMAL LOW (ref >60)
GFR calc non Af Amer: >60 mL/min (ref >60)
Potassium: 3.2 mEq/L — ABNORMAL LOW (ref 3.5–5.1)
Potassium: 3.3 mEq/L — ABNORMAL LOW (ref 3.5–5.1)
Sodium: 133 mEq/L — ABNORMAL LOW (ref 135–145)
Total Bilirubin: 0.7 mg/dL (ref 0.3–1.2)
Total Bilirubin: 0.7 mg/dL (ref 0.3–1.2)
Total Protein: 6.7 g/dL (ref 6.0–8.3)

## 2010-12-31 LAB — URINALYSIS, ROUTINE W REFLEX MICROSCOPIC
Nitrite: NEGATIVE
Specific Gravity, Urine: 1.031 — ABNORMAL HIGH (ref 1.005–1.030)
Urobilinogen, UA: 0.2 mg/dL (ref 0.0–1.0)
pH: 5 (ref 5.0–8.0)

## 2010-12-31 LAB — BASIC METABOLIC PANEL
CO2: 15 mEq/L — ABNORMAL LOW (ref 19–32)
Calcium: 9.4 mg/dL (ref 8.4–10.5)
Chloride: 97 mEq/L (ref 96–112)
Creatinine, Ser: 0.98 mg/dL (ref 0.4–1.2)
GFR calc Af Amer: >60 mL/min (ref >60)
Glucose, Bld: 340 mg/dL — ABNORMAL HIGH (ref 70–99)

## 2010-12-31 LAB — URINE MICROSCOPIC-ADD ON

## 2010-12-31 LAB — GLUCOSE, CAPILLARY
Glucose-Capillary: 193 mg/dL — ABNORMAL HIGH (ref 70–99)
Glucose-Capillary: 197 mg/dL — ABNORMAL HIGH (ref 70–99)
Glucose-Capillary: 220 mg/dL — ABNORMAL HIGH (ref 70–99)
Glucose-Capillary: 233 mg/dL — ABNORMAL HIGH (ref 70–99)
Glucose-Capillary: 234 mg/dL — ABNORMAL HIGH (ref 70–99)
Glucose-Capillary: 249 mg/dL — ABNORMAL HIGH (ref 70–99)
Glucose-Capillary: 277 mg/dL — ABNORMAL HIGH (ref 70–99)
Glucose-Capillary: 292 mg/dL — ABNORMAL HIGH (ref 70–99)
Glucose-Capillary: 303 mg/dL — ABNORMAL HIGH (ref 70–99)
Glucose-Capillary: 355 mg/dL — ABNORMAL HIGH (ref 70–99)

## 2010-12-31 LAB — TYPE AND SCREEN: Antibody Screen: NEGATIVE

## 2010-12-31 LAB — PREPARE PLATELETS

## 2010-12-31 LAB — LIPID PANEL
HDL: 45 mg/dL (ref >39)
LDL Cholesterol: 77 mg/dL (ref 0–99)
Triglycerides: 44 mg/dL (ref <150)
VLDL: 9 mg/dL (ref 0–40)

## 2010-12-31 LAB — HEMOCCULT GUIAC POC 1CARD (OFFICE): Fecal Occult Bld: POSITIVE

## 2010-12-31 LAB — TSH: TSH: 0.148 u[IU]/mL — ABNORMAL LOW (ref 0.350–4.500)

## 2011-02-07 NOTE — Consult Note (Signed)
NAME:  Beck, Angelica NO.:  [id number]   MEDICAL RECORD NO.:  [id number]          PATIENT TYPE:  INP   LOCATION:  1826                         FACILITY:  [facility]   PHYSICIAN:  Luis Abed, MD, FACCDATE OF BIRTH:  04-24-1951   DATE OF CONSULTATION:  05/22/2009  DATE OF DISCHARGE:                                 CONSULTATION   Angelica Beck is 60 years old.  She is here with an incarcerated  abdominal hernia.  The patient is morbidly obese.  She does have  coronary artery disease.  She has moved here and been in this area for  approximately one year after being in [place].  She was to follow-up  with Dr. Toni Arthurs in the near future.  There is a history of congestive  heart failure but this has been stable.  We do not know her ejection  fraction.  There is a history of coronary disease.  She has received  stents in 2005 and in November 2009.  She is on Plavix.   She had abdominal pain and nausea and vomiting.  She is assessed here in  the emergency room and has an incarcerated hernia and needs urgent  surgery.   The patient has not had recent significant chest pain.  She has not had  an admission for congestive heart failure.  She does have exertional  shortness of breath.  This may be getting somewhat worse.   ALLERGIES:  PENICILLIN.   MEDICATIONS:  1. Amlodipine 5.  2. Enalapril 10.  3. Glucovance.  4. Plavix 75.  5. PTU.  6. Symcor.  7. Metoprolol extended-release 50.  8. Aspirin 81.   Other medical problems, see the list below.   SOCIAL HISTORY:  The patient now lives in this area with her family.   FAMILY HISTORY:  There is a family history of coronary disease.   REVIEW OF SYSTEMS:  The patient is uncomfortable at this time.  The  patient denies any headache.  There has been no change in her vision or  her hearing.  As per the HPI, she has had nausea and vomiting.  She had  a fever.  She has abdominal pain.  She has some increased shortness  of  breath when walking.  She has not had any significant peripheral edema.  All other systems are reviewed and are negative.   PHYSICAL EXAMINATION:  VITAL SIGNS:  Blood pressure now is 157/94.  GENERAL:  The patient is oriented to person, time and place.  Affect is  normal.  She is morbidly obese.  She is  in discomfort due to her  abdominal pain problem.  She is lying flat in bed.  She is not short of  breath at this time.  HEENT:  Reveals no xanthelasma.  She has normal  extraocular motion.  NECK:  There are no carotid bruits.  There is no jugular venous  distention.  LUNGS:  Clear.  Respiratory effort is not labored.  CARDIAC:  Exam reveals S1 and S2.  There are no clicks or significant  murmurs.  ABDOMEN:  The abdomen is examined  briefly.  She has discomfort from her  incarcerated hernia.  She has no significant peripheral edema.   LABORATORY DATA:  EKG reveals sinus rhythm with mild sinus tachycardia.  There is decreased R wave in V2.  Hemoglobin is 13.9.  BUN is 8 with  creatinine 0.98 and potassium is 3.9.   PROBLEMS:  1. Hypertension.  2. Diabetes.  3. Morbid obesity.  4. Coronary artery disease.  The patient has received stents in 2005      and 2009 in [place].  This was done in November 2009.  She is on      aspirin and Plavix.  5. Thyroid problem.  It is now clear from the fact that she is on PTU      that she is hyperthyroid.  TSH should be checked with careful      attention to her thyroid status.  6. Abdominal pain with incarcerated hernia.  The patient needs surgery      today and this should be done.  7. History of congestive heart failure.  She has had no acute episodes      recently.  We do not know her left ventricular function.  She is      lying flat in bed at this time.  8. Plavix therapy.  This will have to be carefully addressed.  See the      recommendations below.   With many medical problems, the patient is at increased risk for  surgery.   However, she needs this surgery and she is cleared from the  cardiac viewpoint.   RECOMMENDATIONS:  1. The patient is on metoprolol.  This needs to be continued.  Give      Lopressor 5 mg IV q.6 h when the patient is not getting p.o.      metoprolol succinate.  2. Careful attention needs to be given to her volume status.  She will      not tolerate excess volume.  3. Proceed with 2-D echo on August 29 to help with her overall      postoperative management.  4. TSH because she is on PTU for her thyroid.  5. Plavix therapy.  There is not time to hold her Plavix for this      surgery.  If possible, try to resume her Plavix in approximately 3      days.  It would be even better to restart it sooner, but I think      this will be unlikely to be a possibility.  If her Plavix must be      held longer than 3 days, and if she is stable, she needs to be      fully heparinized.  6. Hold her metformin.      Luis Abed, MD, [facility]  Electronically Signed     JDK/MEDQ  D:  05/22/2009  T:  05/22/2009  Job:  [id number]   cc:   Tomi Bamberger

## 2011-02-07 NOTE — Consult Note (Signed)
NAME:  Angelica Beck, Angelica Beck NO.:  [id number]   MEDICAL RECORD NO.:  [id number]          PATIENT TYPE:  INP   LOCATION:  3305                         FACILITY:  [facility]   PHYSICIAN:  Clovis Pu. Cornett, M.D.DATE OF BIRTH:  Jan 31, 1951   DATE OF CONSULTATION:  05/22/2009  DATE OF DISCHARGE:                                 CONSULTATION   REQUESTING PHYSICIAN:  Trudi Ida. Denton Lank, MD   DIAGNOSIS:  Incarcerated ventral hernia.   CHIEF COMPLAINT AND REASON FOR CONSULTATION:  Incarcerated ventral  hernia.   HISTORY OF PRESENT ILLNESS:  The patient is a 60 year old African  American female with multiple medical problems that include sleep apnea;  morbid obesity; coronary artery disease, status post stent;  hypertension; questionable hyperthyroidism; type 2 diabetes mellitus;  and hyperlipidemia who presents with a 1-day history of abdominal pain.  She had some swelling above her umbilicus for last 1 day.  She is also  very tender over umbilicus with a bulge.  She denies ever having this  bulge before except when her blood sugars are out of control.  She  comes from [place] and is new to the area, so I do not have any medical  records.  She has had no nausea or vomiting.  She is moving her bowels.  Her blood glucose upon admission to the emergency room is 355.  The pain  is moderate to severe in intensity located over the bulge.  The bulge is  not reducible.   PAST MEDICAL HISTORY:  1. Type 2 diabetes mellitus.  2. Hypertension.  3. Morbid obesity.  4. Sleep apnea.  5. Coronary artery disease with stenting according to the patient.  6. Hyperthyroidism.  7. Hyperlipidemia.   MEDICATIONS:  Amlodipine, Enablex, enalapril, gluconate, iron, Lantus,  metoprolol, Plavix, Prevacid, and propylthiouracil.   ALLERGIES:  PENICILLIN.   PAST SURGICAL HISTORY:  1. Abdominal hysterectomy.  2. Questionable coronary artery stents.   FAMILY HISTORY:  Noncontributory.   SOCIAL HISTORY:   She lives with her daughter here in [place],  denies tobacco or alcohol use.   REVIEW OF SYSTEMS:  Positive for abdominal pain, nausea, or vomiting.  Otherwise, 15-point review of systems is negative.   PHYSICAL EXAMINATION:  VITAL SIGNS:  Temperature 98, pulse 163/105,  heart rate 125, and respiratory rate is 20.  GENERAL APPEARANCE:  Female, lying on a right-sided, in no apparent  distress.  HEENT:  No evidence of jaundice.  Oropharynx moist.  NECK:  Supple and nontender.  Trachea midline without mass.  PULMONARY:  Lung sounds are clear.  Chest wall motion is normal.  There  are no wheezes or rhonchi.  Air movement is good.  No use of accessory  muscles.  CARDIOVASCULAR:  Regular rate and rhythm without rub, murmur, or gallop.  EXTREMITIES:  Warm and well-perfused.  ABDOMEN:  Just for the umbilicus, it is about 5-6 cm bulge that is not  reducible.  It is tender to palpation without erythema.  There is no  peritoneal signs, rebound, or guarding in the remainder of the abdomen.  EXTREMITIES:  Trace edema noted.  Muscle tone  appears normal.  Gait is  stable.   DIAGNOSTIC STUDIES:  Glucose 355.  Urinalysis shows glucose over 1000.  There is blood in urine.  Ketones are greater than 80.  There are 7-10  white cells and 21-50 red cells.   IMPRESSION:  1. Ventral hernia with incarceration.  2. Diabetes mellitus with significant hyperglycemia.  3. Chronic obstructive pulmonary disease.  4. Coronary artery disease.  5. She is on Plavix.  6. Hyperlipidemia.   PLAN:  Her medical issues I think are issue here and she need Medicine  to clear and/or to give recommendations about managing all her multitude  of problems.  I will obtain an abdominal pelvic CT scan to see if there  is any bile on this hernia.  If it is only omentum, this can certainly  be managed in a semi-urgent fashion.  There is no evidence of  obstruction or strangulation, certainly, she could be managed as well  to  give this time to medical issues in order to get a better idea of what  her operative risk would be.  If we are not able to do that then she may  require emergency exploration with concomitant increased risk of  complication of bleeding, infection, poor wound healing, pulmonary  complications, cardiovascular complications, or breathing complications  since she is still on her Plavix.  Recommend holding her Plavix right  now if possible, but it is unclear about her cardiac history and  therefore I have asked the emergency room to contact Medicine to further  evaluate her since her workup is quite early at this point and  incomplete.  I will follow up on her workup once it is done.      Thomas A. Cornett, M.D.  Electronically Signed     TAC/MEDQ  D:  05/22/2009  T:  05/23/2009  Job:  [id number]

## 2011-02-07 NOTE — H&P (Signed)
NAME:  Angelica Beck, Angelica Beck NO.:  [id number]   MEDICAL RECORD NO.:  [id number]          PATIENT TYPE:  INP   LOCATION:  3305                         FACILITY:  [facility]   PHYSICIAN:  Leighton Roach McDiarmid, M.D.DATE OF BIRTH:  [birth year]   DATE OF ADMISSION:  05/22/2009  DATE OF DISCHARGE:                              HISTORY & PHYSICAL   PRIMARY CARE PHYSICIAN:  Tomi Bamberger, certified nurse practitioner in  [place].   CHIEF COMPLAINT:  Abdominal pain, vomiting.   HISTORY OF PRESENT ILLNESS:  This is a 60 year old female with a past  medical history of diabetes, hypertension, coronary artery disease  status post stent x2 who is in the emergency room complaining of  vomiting that started yesterday.  The patient states that vomiting  started yesterday and she has vomited several times but has been able to  keep down cold water.  Yesterday she was able to eat breakfast and had  some pretzels for lunch.  Since then she has not been able to eat  anything and has been vomiting multiple times.  She endorses some  abdominal discomfort, especially after been examined by Dr. Luisa Hart.  She states that her vomitus has been nonbloody.  Bowel movement has been  normal if the patient takes stool softeners.  She denies any fevers but  does endorse some cold sweats yesterday.  The patient denies bloody  bowel movement.  She states that her abdomen has a swelling on it but  that  was attributed to her injection site for insulin.  The patient  moved here from the [place] in November of 2009.  She has not had any  medical care until  3 months ago because her Medicaid from [place] did  not transfer here.  Prior to moving from [place], her doctor in [place] has  given her enough medication to take anticipating that her  Medicaid will not be activated for several months.  Currently the  patient states that pain is about 6 out of 10.   PAST MEDICAL HISTORY.:  1. Diabetes times and 10  years.  2. Hypertension.  3. Coronary artery disease/  status post stent x2; the first stent in      2005 and second stent in  2009.  4. Morbid obesity.  5. GERD.  6. Hyperthyroidism.  7. Urinary incontinence.   PAST SURGICAL HISTORY:  1. Stent x 2 first one in 2005, second one in 2009.  2. The patient had an echo in 2008.  She does not remember the      results.   ALLERGIES:  PENICILLIN WHICH CAUSES EYES AND THROAT SWELLING.   MEDICATIONS:  1. Glucovance 5/500 p.o. b.i.d.  2. Lantus 25 units subcutaneous  q.a.m.  3. Norvasc 5 mg p.o. daily.  4. Enalapril 10 mg p.o. daily.  5. Metoprolol 50 mg p.o. daily.  6. Simcor  20/500 p.o. daily  7. Prevacid 40 mg p.o. daily.  8. PTU 50 mg p.o. b.i.d.  9. Enablex 15 mg p.o. daily.  10.Iron p.o. daily.  11.Vitamin D 5000 units p.o. q. Monday.  SOCIAL HISTORY:  The patient lives with her daughter and three  grandchildren.  Occupation:  She is disabled.  She worked at one time as  a Water engineer.  Tobacco, no.  Alcohol, no.  Drugs, no.   FAMILY HISTORY:  Noncontributory.   REVIEW OF SYSTEMS:  GENERAL:  The patient denies fever, headaches, chest  pain, shortness of breath, palpitations, cough, bloody stools, diarrhea,  hematuria.   PHYSICAL EXAMINATION:  VITAL SIGNS:  Temperature 98, pulse 70s,  respiratory rate 16, blood pressure 180s/ 110s, pulse oximetry 99% on  room air.  GENERAL:  The patient is mildly distressed but cooperative.  HEENT:  Pupils equal, round, react to light and accommodation.  Extraocular motility intact.  NECK:  No masses, no bruits.  Supple.  CARDIOVASCULAR:  Tachy, otherwise regular rhythm.  No murmur.  LUNGS:  Clear to auscultation bilaterally.  No wheezing.  ABDOMEN:  Morbidly obese.  Abdominal hernia about the size of a  grapefruit, 4 inches in diameter and a little bit left of midline. It is  not reducible.  Generalized tenderness on palpation.  Positive bowel  sounds.  GU:  Deferred.  RECTAL:   Sphincter tone is intact.  No external or internal hemorrhoids  on exam.  EXTREMITIES:  No cyanosis, clubbing or edema.  NEUROLOGIC:  Cranial nerves II-XII grossly intact.  Reflexes +2,  strength +5.   LABS/STUDIES:  1. UA showed specific gravity of 1.031, more than 1000 glucose,  more      than 80 ketones, large blood, 100 protein and trace  leukocyte      esterase.  2. Urine micro showed few squamous epithelium,  granular casts, white      blood cells  7 to 10, red blood cells 21-50, few bacteria.  3. CBC showed white blood cells of 16.7.  Hemoglobin 13.9, hematocrit      43.2, platelet 269.  4. BMET with sodium 131, potassium 3.9, chloride 97, CO2 15, BUN 8,      creatinine 0.98, glucose 340, calcium 9.4, fecal occult blood      positive.  5. EKG that showed sinus tach of 105 beats per minute, PR  interval of      196,  QTC of 479 with possible left axis deviation.   ASSESSMENT/PLAN:  This is a 60 year old female with abdominal pain and  vomiting concerning for incarcerated ventral abdominal hernia.   1. Abdominal pain concerning for incarcerated ventral abdominal      hernia, questionable small bowel obstruction, questionable      ischemia.  General surgery, Dr. Luisa Hart has been by to evaluate the      patient.  Abdominal and pelvic CT has been ordered but not      performed yet.  In the meantime we will admit the patient to step-      down unit and start empiric antibiotics.  We will start with Avelox      and the patient has a penicillin allergy and anaerobic coverage      with Flagyl.  The patient is currently afebrile but has an elevated      white blood cell.  We will n.p.o. the patient in anticipation of      surgery.  We will give the patient Zofran for nausea and vomiting.      The patient can have morphine 4 mg IV q. 4 hours p.r.n. pain.  2. Diabetes.  We will get A1c to evaluate degree of her disease.  We      will start sliding scale insulin sensitive scale since the  patient      is n.p.o. at this time.  We will monitor CBGs and  make adjustments      with her meds.  We will hold Lanrtus  and Glucovance at this time      since the patient is n.p.o..  Kidney function from BMET is within      normal limits.  3. Hypertension.  Blood pressure is very elevated with systolic in the      180s and diastolic 110s.  Some of this may be secondary to pain.      The patient also has not been taking her hypertensive medicines in      the last 2 days.  We will restart all her home meds and will have      hydralazine 10 mg IV q.4 h. p.r.n.  4. Hyperlipidemia.  Will continue Simcor.  Most likely will need to      switch to formulary here which is niacin and simvastatin.  5. Hypothyroidism.  Will continue PTU.  6. Coronary artery disease/ stent.  The patient currently on Plavix.      We will hold Plavix for now.  We will consult Cardiology for      surgery clearance.  We will also hold aspirin pending surgery.  7. Gastroesophageal reflux disease.  Will continue Protonix 40 mg      daily.  8. Urinary incontinence.  Will continue Enablex.  9. Pain.  We will  continue the patient on morphine 4 mg IV q.4 h.      pain.  10.Fluids, electrolytes and nutrition / GI:  The patient currently      n.p.o.  Will give D5 half normal saline at 150 mL an hour.  If the      patient cannot tolerate p.o. medications, we will start an NG tube      and give  meds per NG tube.  11.Prophylaxis.  Bilateral SCDs.  Cannot give heparin in anticipation      of surgery.  12.Disposition:  Pending  CT scan and surgery.      Angeline Slim, MD  Electronically Signed      Leighton Roach McDiarmid, M.D.  Electronically Signed    CT/MEDQ  D:  05/22/2009  T:  05/22/2009  Job:  [id number]

## 2011-02-07 NOTE — Op Note (Signed)
NAME:  Angelica, Beck NO.:  [id number]   MEDICAL RECORD NO.:  [id number]          PATIENT TYPE:  INP   LOCATION:  3305                         FACILITY:  [facility]   PHYSICIAN:  Clovis Pu. Cornett, M.D.DATE OF BIRTH:  1950/10/30   DATE OF PROCEDURE:  DATE OF DISCHARGE:                               OPERATIVE REPORT   PREOPERATIVE DIAGNOSIS:  Incarcerated ventral hernia.   POSTOPERATIVE DIAGNOSIS:  Incarcerated antral hernia measuring 4 x 6 cm.   PROCEDURE:  Repair of incarcerated ventral hernia with Proceed mesh.   SURGEON:  Maisie Fus A. Cornett, MD   ANESTHESIA:  General endotracheal anesthesia.   ESTIMATED BLOOD LOSS:  Approximately 50 mL.   ASSISTANT:  Wilmon Arms. Tsuei, MD   DRAINS:  19-Blake drain to subcutaneous tissue.   INDICATIONS FOR PROCEDURE:  The patient is a 60 year old female who  presents with incarcerated ventral hernia.  CT scan was done to further  evaluate this given her multiple medical problems and she had  significant small bowel and colon up in this.  It was felt that emergent  exploration was indicated for incarceration.  She was given  perioperative platelets due to being on Plavix.  She was initially  admitted to the medical service.  Informed consent was obtained.  Complications of bleeding, infection, bowel injury, possible colostomy,  possible bowel resection, recurrent small bowel obstruction, wound  problems were all discussed with the patient and her daughter today.  They agreed to proceed.   DESCRIPTION OF PROCEDURE:  The patient was brought to the operating room  and placed supine.  After induction of general anesthesia, the abdomen  was prepped and draped in sterile fashion.  Foley catheter was placed  prior to this.  Abdomen was sterilely prepped and draped.  The incision  was made just above the umbilicus.  Dissection was carried down, a very  large hernia sac was encountered.  We opened the hernia sac and  evacuated some  ascitic fluid.  The bowel was viable, which included  small bowel and transverse colon.  We had to take the omentum down off  the edge of the hernia sac using cautery.  There was some oozing, but  this was controlled with cautery.  We reduced all the contents back in  the abdominal cavity and inspected them carefully and saw no evidence of  injury to the small bowel or colon.  Both were viable with no signs of  ischemia.  We did have to divide the omentum down the middle to help  free it off the undersurface of hernia sac.  Once this was done, this  defect measured roughly 4 x 6 cm.  A 10 x 10 cm Proceed mesh was used.  We put the nonadhesive side toward the bowel.  This was secured with  interrupted #1 Novofil circumferentially.  We pulled this up, tied  these, and a good at least 3 cm of overlap looked like.  I then closed  fascia with #1 running PDS.  The large previous hernia sac cavity was  drained with a 19-French Blake drain.  We then approximated the  skin  with staples.  Dry dressings were applied.  The bulb was placed to  suction.  All final counts of sponge, needle, and instruments were found  be correct at this portion of the case.  The patient was awoke and taken  to recovery in satisfactory condition.      Thomas A. Cornett, M.D.  Electronically Signed     TAC/MEDQ  D:  05/22/2009  T:  05/23/2009  Job:  [id number]

## 2011-04-04 ENCOUNTER — Other Ambulatory Visit: Payer: Self-pay | Admitting: Internal Medicine

## 2011-04-04 DIAGNOSIS — E049 Nontoxic goiter, unspecified: Secondary | ICD-10-CM

## 2011-04-06 ENCOUNTER — Ambulatory Visit
Admission: RE | Admit: 2011-04-06 | Discharge: 2011-04-06 | Disposition: A | Discharge status: Home / Self Care | Payer: Medicaid Other | Source: Ambulatory Visit | Attending: Internal Medicine | Admitting: Internal Medicine

## 2011-04-06 DIAGNOSIS — E049 Nontoxic goiter, unspecified: Secondary | ICD-10-CM

## 2012-05-02 ENCOUNTER — Ambulatory Visit: Payer: Medicare Other | Attending: Internal Medicine | Admitting: *Deleted

## 2012-05-02 DIAGNOSIS — M6281 Muscle weakness (generalized): Secondary | ICD-10-CM | POA: Insufficient documentation

## 2012-05-02 DIAGNOSIS — IMO0001 Reserved for inherently not codable concepts without codable children: Secondary | ICD-10-CM | POA: Insufficient documentation

## 2012-05-02 DIAGNOSIS — R262 Difficulty in walking, not elsewhere classified: Secondary | ICD-10-CM | POA: Insufficient documentation

## 2012-05-08 ENCOUNTER — Encounter: Payer: Medicaid Other | Admitting: *Deleted

## 2012-05-14 ENCOUNTER — Ambulatory Visit: Payer: Medicaid Other | Admitting: *Deleted

## 2012-05-21 ENCOUNTER — Ambulatory Visit: Payer: Medicare Other | Admitting: *Deleted

## 2012-05-28 ENCOUNTER — Ambulatory Visit: Payer: Medicare Other | Attending: Internal Medicine | Admitting: *Deleted

## 2012-05-28 DIAGNOSIS — M6281 Muscle weakness (generalized): Secondary | ICD-10-CM | POA: Insufficient documentation

## 2012-05-28 DIAGNOSIS — IMO0001 Reserved for inherently not codable concepts without codable children: Secondary | ICD-10-CM | POA: Insufficient documentation

## 2012-05-28 DIAGNOSIS — R262 Difficulty in walking, not elsewhere classified: Secondary | ICD-10-CM | POA: Insufficient documentation

## 2012-05-29 ENCOUNTER — Encounter: Payer: Medicaid Other | Admitting: Occupational Therapy

## 2012-06-04 ENCOUNTER — Ambulatory Visit: Payer: Medicare Other | Admitting: *Deleted

## 2012-06-05 ENCOUNTER — Encounter: Payer: Medicaid Other | Admitting: *Deleted

## 2012-06-10 ENCOUNTER — Ambulatory Visit: Payer: Medicare Other | Admitting: Physical Therapy

## 2012-06-17 ENCOUNTER — Ambulatory Visit: Payer: Medicare Other | Admitting: *Deleted

## 2012-06-24 ENCOUNTER — Ambulatory Visit: Payer: Medicaid Other | Admitting: Physical Therapy

## 2013-04-04 ENCOUNTER — Other Ambulatory Visit: Payer: Self-pay | Admitting: Cardiology

## 2013-04-04 DIAGNOSIS — E049 Nontoxic goiter, unspecified: Secondary | ICD-10-CM

## 2013-04-08 ENCOUNTER — Ambulatory Visit
Admission: RE | Admit: 2013-04-08 | Discharge: 2013-04-08 | Disposition: A | Discharge status: Home / Self Care | Payer: Medicare Other | Source: Ambulatory Visit | Attending: Cardiology | Admitting: Cardiology

## 2013-04-08 DIAGNOSIS — E049 Nontoxic goiter, unspecified: Secondary | ICD-10-CM

## 2013-05-23 ENCOUNTER — Ambulatory Visit: Payer: Medicare Other | Admitting: Dietician

## 2013-06-20 ENCOUNTER — Encounter: Payer: Self-pay | Admitting: Pulmonary Disease

## 2013-06-20 ENCOUNTER — Ambulatory Visit (INDEPENDENT_AMBULATORY_CARE_PROVIDER_SITE_OTHER): Payer: Medicare Other | Admitting: Pulmonary Disease

## 2013-06-20 VITALS — BP 130/82 | HR 108 | Temp 99.0°F | Ht 65.0 in | Wt 266.0 lb

## 2013-06-20 DIAGNOSIS — G4733 Obstructive sleep apnea (adult) (pediatric): Secondary | ICD-10-CM

## 2013-06-20 DIAGNOSIS — E662 Morbid (severe) obesity with alveolar hypoventilation: Secondary | ICD-10-CM

## 2013-06-20 HISTORY — DX: Morbid (severe) obesity with alveolar hypoventilation: E66.2

## 2013-06-20 NOTE — Assessment & Plan Note (Signed)
She has prior history of sleep apnea, but has never been on therapy.  She has history of stroke, hypertension, and diabetes.  She reports snoring, sleep disruption, and daytime sleepiness.  I am concerned she could still have sleep apnea.  We discussed how sleep apnea can affect various health problems including risks for hypertension, cardiovascular disease, and diabetes.  We also discussed how sleep disruption can increase risks for accident, such as while driving.  Weight loss as a means of improving sleep apnea was also reviewed.  Additional treatment options discussed were CPAP therapy, oral appliance, and surgical intervention.  To further assess will need in lab sleep study.

## 2013-06-20 NOTE — Patient Instructions (Signed)
Will arrange for sleep study Will call to arrange for follow up after sleep study reviewed 

## 2013-06-20 NOTE — Progress Notes (Deleted)
  Subjective:    Patient ID: Angelica Beck, female    DOB: Jan 23, 1951, 62 y.o.   MRN: [id number]  HPI    Review of Systems  Constitutional: Negative for fever, chills, diaphoresis, activity change, appetite change, fatigue and unexpected weight change.  HENT: Negative for hearing loss, ear pain, nosebleeds, congestion, sore throat, facial swelling, rhinorrhea, sneezing, mouth sores, trouble swallowing, neck pain, neck stiffness, dental problem, voice change, postnasal drip, sinus pressure, tinnitus and ear discharge.   Eyes: Negative for photophobia, discharge, itching and visual disturbance.  Respiratory: Negative for apnea, cough, choking, chest tightness, shortness of breath, wheezing and stridor.   Cardiovascular: Negative for chest pain, palpitations and leg swelling.  Gastrointestinal: Negative for nausea, vomiting, abdominal pain, constipation, blood in stool and abdominal distention.  Genitourinary: Negative for dysuria, urgency, frequency, hematuria, flank pain, decreased urine volume and difficulty urinating.  Musculoskeletal: Negative for myalgias, back pain, joint swelling, arthralgias and gait problem.  Skin: Negative for color change, pallor and rash.  Neurological: Negative for dizziness, tremors, seizures, syncope, speech difficulty, weakness, light-headedness, numbness and headaches.  Hematological: Negative for adenopathy. Does not bruise/bleed easily.  Psychiatric/Behavioral: Positive for sleep disturbance. Negative for confusion and agitation. The patient is not nervous/anxious.        Objective:   Physical Exam        Assessment & Plan:

## 2013-06-20 NOTE — Progress Notes (Signed)
Chief Complaint  Patient presents with  . Sleep Consult    referred by Dr. Jacinto Halim for OSA. Epworth Score: 3.    History of Present Illness: Angelica Beck is a 62 y.o. female for evaluation of sleep problems.  She has trouble falling asleep and staying asleep.  She was diagnosed with a stroke 4 years ago while living in [place].  She was then found to have sleep apnea.  She moved to [place] with her daughter prior to being started on CPAP.  She continues to have snoring, sleep disruption, and daytime sleepiness.  She is followed by Dr. Jacinto Halim for chest pain.  She was advised to have further assessment of sleep apnea, and was referred to pulmonary/sleep medicine.  She goes to sleep at 11 pm.  She takes trazodone and OTC sleep aide before going to sleep.  She falls asleep quickly if she uses her sleeping medicine.  Otherwise it can take several hours to fall asleep.  She wakes up once or twice to use the bathroom.  She gets out of bed at 11 am.  She feels okay in the morning, but will get tired and take naps as the day goes on.  She denies morning headache.  She does not use anything to help her stay awake.  She can fall asleep easily if she is watching TV.  She denies sleep walking, sleep talking, bruxism, or nightmares.  There is no history of restless legs.  She denies sleep hallucinations, sleep paralysis, or cataplexy.  The Epworth score is 3 out of 24.  Tests:   Angelica Beck  has a past medical history of Hypertension; Stroke; Arthritis; Diabetes mellitus, type 2; Hyperlipidemia; and Hyperthyroidism.  Angelica Beck  has past surgical history that includes Ventral hernia repair.  Prior to Admission medications   Medication Sig Start Date End Date Taking? Authorizing Provider  amLODipine (NORVASC) 10 MG tablet Take 1 tablet by mouth daily. 06/05/13   Historical Provider, MD  clopidogrel (PLAVIX) 75 MG tablet Take 1 tablet by mouth daily. 06/05/13   Historical Provider, MD   CRESTOR 40 MG tablet Take 1 tablet by mouth daily. 06/05/13   Historical Provider, MD  enalapril (VASOTEC) 20 MG tablet Take 1 tablet by mouth daily. 06/05/13   Historical Provider, MD  LANTUS SOLOSTAR 100 UNIT/ML SOPN  06/05/13   Historical Provider, MD  metFORMIN (GLUCOPHAGE) 500 MG tablet Take 1 tablet by mouth 2 (two) times daily. 06/05/13   Historical Provider, MD  niacin (NIASPAN) 500 MG CR tablet Take 1 tablet by mouth 2 (two) times daily. 06/05/13   Historical Provider, MD  NOVOLOG FLEXPEN 100 UNIT/ML SOPN FlexPen  06/05/13   Historical Provider, MD  oxybutynin (DITROPAN XL) 15 MG 24 hr tablet Take 1 tablet by mouth daily. 06/05/13   Historical Provider, MD  potassium chloride SA (K-DUR,KLOR-CON) 20 MEQ tablet Take 1 tablet by mouth daily. 06/05/13   Historical Provider, MD  propylthiouracil (PTU) 50 MG tablet Take 1 tablet by mouth daily. 06/05/13   Historical Provider, MD  ranitidine (ZANTAC) 150 MG tablet Take 1 tablet by mouth daily. 06/05/13   Historical Provider, MD  traZODone (DESYREL) 50 MG tablet Take 1 tablet by mouth at bedtime. 06/05/13   Historical Provider, MD    Allergies  Allergen Reactions  . Penicillins     Her family history includes Diabetes in her mother.  She  reports that she quit smoking about 10 years ago. Her smoking use included Cigarettes. She  has a 7.5 pack-year smoking history. She has never used smokeless tobacco. She reports that she does not drink alcohol or use illicit drugs.  Review of Systems  Constitutional: Negative for fever, chills, diaphoresis, activity change, appetite change, fatigue and unexpected weight change.  HENT: Negative for hearing loss, ear pain, nosebleeds, congestion, sore throat, facial swelling, rhinorrhea, sneezing, mouth sores, trouble swallowing, neck pain, neck stiffness, dental problem, voice change, postnasal drip, sinus pressure, tinnitus and ear discharge.   Eyes: Negative for photophobia, discharge, itching and visual  disturbance.  Respiratory: Negative for apnea, cough, choking, chest tightness, shortness of breath, wheezing and stridor.   Cardiovascular: Negative for chest pain, palpitations and leg swelling.  Gastrointestinal: Negative for nausea, vomiting, abdominal pain, constipation, blood in stool and abdominal distention.  Genitourinary: Negative for dysuria, urgency, frequency, hematuria, flank pain, decreased urine volume and difficulty urinating.  Musculoskeletal: Negative for myalgias, back pain, joint swelling, arthralgias and gait problem.  Skin: Negative for color change, pallor and rash.  Neurological: Negative for dizziness, tremors, seizures, syncope, speech difficulty, weakness, light-headedness, numbness and headaches.  Hematological: Negative for adenopathy. Does not bruise/bleed easily.  Psychiatric/Behavioral: Positive for sleep disturbance. Negative for confusion and agitation. The patient is not nervous/anxious.    Physical Exam:  General - No distress ENT - No sinus tenderness, MP 3, wears dentures, no oral exudate, no LAN, diffusely enlarged thyroid, TM clear, pupils equal/reactive Cardiac - s1s2 regular, no murmur, pulses symmetric Chest - No wheeze/rales/dullness, good air entry, normal respiratory excursion Back - No focal tenderness Abd - Soft, non-tender, no organomegaly, + bowel sounds Ext - No edema Neuro - Normal strength, cranial nerves intact Skin - No rashes Psych - Normal mood, and behavior

## 2013-07-14 ENCOUNTER — Encounter: Payer: Medicare Other | Attending: Nurse Practitioner | Admitting: Dietician

## 2013-07-14 ENCOUNTER — Encounter: Payer: Self-pay | Admitting: Dietician

## 2013-07-14 VITALS — Ht 64.0 in | Wt 267.5 lb

## 2013-07-14 DIAGNOSIS — E785 Hyperlipidemia, unspecified: Secondary | ICD-10-CM | POA: Insufficient documentation

## 2013-07-14 DIAGNOSIS — E119 Type 2 diabetes mellitus without complications: Secondary | ICD-10-CM | POA: Insufficient documentation

## 2013-07-14 DIAGNOSIS — Z713 Dietary counseling and surveillance: Secondary | ICD-10-CM | POA: Insufficient documentation

## 2013-07-14 DIAGNOSIS — E118 Type 2 diabetes mellitus with unspecified complications: Secondary | ICD-10-CM

## 2013-07-14 NOTE — Progress Notes (Signed)
Appt start time: 1000 end time:  1100.  Assessment:  Patient was seen on 07/14/13 for individual diabetes education. Pt is cared for by a caretaker, who is present today, and her daughter, with whom she lives. Pt had CVA 4 years prior and 3 years prior.   Current HbA1c: 11.6  MEDICATIONS: see list.   DIETARY INTAKE:  Usual eating pattern includes 3 meals and 2 snacks per day. Everyday foods include soda, kool aid, lemonade, dessert foods.  Avoided foods include diet soda.    24-hr recall:  B ( AM): yogurt or banana with her meds. TV dinner or bagel with cream cheese. Soda or lemonade to drink.   Snk ( AM): none  L ( PM): TV dinners or frozen pizza with a salad.  Snk ( PM): bag of chips, cookies, candy D ( PM): steak, corn, rice. Soda or kool aid, or lemonade. Snk ( PM): candy, pie.  Beverages: kool aid, soda, lemonade, sweet tea, water.   Usual physical activity: walks inside the house some. No active hobbies. Pt walks with a walker to stabilize her back and provide better balance, or she will use a cain.  Progress Towards Goal(s):  In progress.   Nutritional Diagnosis:  NI-5.8.2 Excessive carbohydrate intake As related to tyipe II DM.  As evidenced by large volume of sweetened beverages, dessert items, HgbA1c 11.6.    Intervention:  Nutrition counseling provided.  Discussed diabetes disease process and treatment options.  Discussed physiology of diabetes and role of obesity on insulin resistance.  Encouraged moderate weight reduction to improve glucose levels.  Discussed role of medications and diet in glucose control  Provided education on macronutrients on glucose levels.  Provided education on carb counting, importance of regularly scheduled meals/snacks, and meal planning  Discussed effects of physical activity on glucose levels and long-term glucose control.  Recommended 150 minutes of physical activity/week.  Reviewed patient medications.  Discussed role of medication on  blood glucose and possible side effects  Discussed blood glucose monitoring and interpretation.  Discussed recommended target ranges and individual ranges.    Described short-term complications: hyper- and hypo-glycemia.  Discussed causes,symptoms, and treatment options.  Discussed prevention, detection, and treatment of long-term complications.  Discussed the role of prolonged elevated glucose levels on body systems.  Discussed role of stress on blood glucose levels and discussed strategies to manage psychosocial issues.  Discussed recommendations for long-term diabetes self-care.  Established checklist for medical, dental, and emotional self-care.  RD described explicit changes that can be made with relative ease to improve current meal pattern, particularly purchasing lower kcal frozen meals like Healthy Choice, SmartOnes, and Lean Cuisine in favor of current choices, and eliminating sugar sweetened beverages in favor of crystal light, light juice, seltzer water, plain tea.   Handouts given during visit include:  Best Protein, Fat, and CHO foods  Living Well with Diabetes  Barriers to learning/adherance to lifestyle change: Pt seems disinterested in care plan, very reluctant to do any form of physical activity, reluctant to alter drink consumption, does not prepare her own meals.  Diabetes self-care support plan:   [facility] support group  Monitoring/Evaluation:  Dietary intake, exercise, portion control, and body weight in 2 month(s).

## 2013-07-16 ENCOUNTER — Encounter: Payer: Self-pay | Admitting: Podiatrist

## 2013-07-16 ENCOUNTER — Ambulatory Visit (INDEPENDENT_AMBULATORY_CARE_PROVIDER_SITE_OTHER): Payer: Medicare Other | Admitting: Podiatrist

## 2013-07-16 VITALS — BP 151/74 | HR 85 | Resp 12 | Ht 67.0 in | Wt 265.0 lb

## 2013-07-16 DIAGNOSIS — Q828 Other specified congenital malformations of skin: Secondary | ICD-10-CM

## 2013-07-16 DIAGNOSIS — M216X9 Other acquired deformities of unspecified foot: Secondary | ICD-10-CM

## 2013-07-16 DIAGNOSIS — E1149 Type 2 diabetes mellitus with other diabetic neurological complication: Secondary | ICD-10-CM

## 2013-07-16 DIAGNOSIS — B351 Tinea unguium: Secondary | ICD-10-CM

## 2013-07-16 DIAGNOSIS — M79609 Pain in unspecified limb: Secondary | ICD-10-CM

## 2013-07-16 NOTE — Patient Instructions (Signed)

## 2013-07-16 NOTE — Progress Notes (Signed)
  Subjective:    Patient ID: Angelica Beck, female    DOB: 1951/09/07, 62 y.o.   MRN: [id number]  HPI Comments: '' TOENAILS TRIM''     Review of Systems  Constitutional: Negative.   HENT: Negative.   Eyes: Positive for visual disturbance.  Respiratory: Positive for shortness of breath and wheezing.   Cardiovascular: Positive for leg swelling.  Gastrointestinal: Negative.   Endocrine: Negative.   Genitourinary: Positive for frequency.  Musculoskeletal: Positive for back pain.  Skin: Negative.   Allergic/Immunologic: Negative.   Neurological: Negative.   Hematological: Negative.   Psychiatric/Behavioral: Negative.        Objective:   Physical Exam Neurovascular status is intact and unchanged from the previous visit.  nails are elongated thick and discolored dystrophic from brittle and clinically mycotic. Prominent plantarflexed metatarsals are also noted with associated keratotic lesions present.           Assessment:  Painful mycotic nails x10 , diabetes with neuropathy prominent plantarflexed metatarsals with porokeratotic lesions present Plan: Discussed etiology, pathology, conservative vs. Surgical therapies and at this time debridement of symptomatic toenails was recommended.  Onychoreduction of symptomatic toenails was performed without iatrogenic incident.   keratotic lesions were also debrided without complication. Patient was instructed on signs and symptoms of infection and was told to call immediately should any of these arise.

## 2013-07-20 ENCOUNTER — Ambulatory Visit (HOSPITAL_BASED_OUTPATIENT_CLINIC_OR_DEPARTMENT_OTHER): Payer: Medicare Other | Attending: Pulmonary Disease

## 2013-07-20 VITALS — Ht 65.0 in | Wt 265.0 lb

## 2013-07-20 DIAGNOSIS — I10 Essential (primary) hypertension: Secondary | ICD-10-CM | POA: Insufficient documentation

## 2013-07-20 DIAGNOSIS — E119 Type 2 diabetes mellitus without complications: Secondary | ICD-10-CM | POA: Insufficient documentation

## 2013-07-20 DIAGNOSIS — G4733 Obstructive sleep apnea (adult) (pediatric): Secondary | ICD-10-CM

## 2013-07-20 DIAGNOSIS — G4710 Hypersomnia, unspecified: Secondary | ICD-10-CM | POA: Insufficient documentation

## 2013-07-20 DIAGNOSIS — Z8673 Personal history of transient ischemic attack (TIA), and cerebral infarction without residual deficits: Secondary | ICD-10-CM | POA: Insufficient documentation

## 2013-07-28 ENCOUNTER — Telehealth: Payer: Self-pay | Admitting: Pulmonary Disease

## 2013-07-28 DIAGNOSIS — G4733 Obstructive sleep apnea (adult) (pediatric): Secondary | ICD-10-CM

## 2013-07-28 DIAGNOSIS — G4761 Periodic limb movement disorder: Secondary | ICD-10-CM | POA: Insufficient documentation

## 2013-07-28 NOTE — Telephone Encounter (Signed)
PSG 07/20/13 >> AHI 0.6, SpO2 low 88%, Spent 6.6 min with SpO2 < 90%, PLMI 58.5, decreased sleep time  Will have my nurse inform pt that sleep study did not show sleep apnea, but she did have low oxygen level during sleep study.  Will arrange for ONO with RA to further assess (I have placed order with [facility]).  She will need ROV after ONO done to discuss results of tests in more detail.

## 2013-07-28 NOTE — Telephone Encounter (Signed)
Pt is aware of sleep study results. Once ONO is done she will call to set up ROV.

## 2013-07-29 ENCOUNTER — Other Ambulatory Visit: Payer: Self-pay | Admitting: Pulmonary Disease

## 2013-07-29 DIAGNOSIS — E662 Morbid (severe) obesity with alveolar hypoventilation: Secondary | ICD-10-CM

## 2013-07-29 NOTE — Procedures (Signed)
NAME:  Angelica Beck, Angelica Beck NO.:  [id number]  MEDICAL RECORD NO.:  [id number]          PATIENT TYPE:  OUT  LOCATION:  SLEEP CENTER                 FACILITY:  [facility]  PHYSICIAN:  Coralyn Helling, MD        DATE OF BIRTH:  04-26-1951  DATE OF STUDY:  07/20/2013                           NOCTURNAL POLYSOMNOGRAM  REFERRING PHYSICIAN:  Coralyn Helling, MD  FACILITY:  [facility].  INDICATION FOR STUDY:  [name] is a 62 year old female who has a history of previous stroke, diabetes, and hypertension.  She reports having a previous diagnosis of obstructive sleep apnea.  She also reports snoring, sleep disruption, and daytime sleepiness.  She was referred to the Sleep Lab for further evaluation of hypersomnia with obstructive sleep apnea.  Height is 5 feet and 5 inches, weight is 265 pounds.  BMI is 44, neck size is 16 inches.  EPWORTH SLEEPINESS SCORE:  7.  MEDICATIONS:  Medications are reviewed in her chart.  SLEEP ARCHITECTURE:  Total recording time was 378 minutes.  Total sleep time was 102 minutes.  Sleep efficiency was 28%, sleep latency was 80 minutes.  The study was notable for lack of slow-wave sleep and REM sleep and she slept predominantly in the nonsupine position.  RESPIRATORY DATA:  The average respiratory rate was 16.  Loud snoring was noted by the technician.  The overall apnea-hypopnea index was 0.6. The events were exclusively obstructive in nature.  OXYGEN DATA:  The baseline oxygenation was 92%.  The oxygen saturation nadir was 88%.  The patient spent a total of 6.6 minutes with an oxygen saturation below 90%.  CARDIAC DATA:  The average heart rate was 62 and the rhythm strip showed sinus rhythm with sinus arrhythmia.  MOVEMENT-PARASOMNIA:  The periodic limb movement index was 58.5.  The patient had two restroom trips.  IMPRESSIONS-RECOMMENDATIONS:  This sleep study did not show evidence for obstructive sleep apnea.  Of note is that she  had minimal amount of sleep time, and was not observed in REM sleep.  Therefore, the presence of sleep-disordered breathing could be underestimated.  She did have persistent oxygen desaturation for greater than 5 minutes of the study in the absence of apneic events.  This could be suggestive of sleep-related hypoventilation.  She had a significant increase in her periodic limb movement index and clinical correlation would be necessary to determine the significant of this.     Coralyn Helling, MD Diplomat, American Board of Sleep Medicine    VS/MEDQ  D:  07/28/2013 13:05:55  T:  07/29/2013 03:10:57  Job:  [id number]

## 2013-08-12 ENCOUNTER — Telehealth: Payer: Self-pay | Admitting: Pulmonary Disease

## 2013-08-12 DIAGNOSIS — R0902 Hypoxemia: Secondary | ICD-10-CM

## 2013-08-12 NOTE — Telephone Encounter (Signed)
Pt is aware of ONO results. She would like to go ahead with starting O2 QHS. ROV has been scheduled for after the first of the year to follow up. Order will be placed for O2 QHS.

## 2013-08-12 NOTE — Telephone Encounter (Signed)
ONO with RA 07/30/13 >> Test time 9 hrs 47 min.  Baseline SpO2 94%, low SpO2 68%.  Spent 1 hr 6 min with SpO2 < 89%.  Will have my nurse inform pt that ONO shows low oxygen levels while asleep.  Options are to 1) arrange for 2 liters oxygen at night, repeat ONO with 2 liters oxygen, and then arrange for follow up after this, or 2) arrange for follow up visit first to discuss test results in more detail.

## 2013-08-29 ENCOUNTER — Encounter: Payer: Self-pay | Admitting: Pulmonary Disease

## 2013-09-08 ENCOUNTER — Ambulatory Visit: Payer: Medicare Other | Admitting: Dietician

## 2013-10-09 ENCOUNTER — Ambulatory Visit (INDEPENDENT_AMBULATORY_CARE_PROVIDER_SITE_OTHER): Payer: Medicare Other | Admitting: Pulmonary Disease

## 2013-10-09 ENCOUNTER — Other Ambulatory Visit (INDEPENDENT_AMBULATORY_CARE_PROVIDER_SITE_OTHER): Payer: Medicare Other

## 2013-10-09 ENCOUNTER — Encounter: Payer: Self-pay | Admitting: Pulmonary Disease

## 2013-10-09 VITALS — BP 134/84 | HR 96 | Ht 65.0 in | Wt 269.0 lb

## 2013-10-09 DIAGNOSIS — E662 Morbid (severe) obesity with alveolar hypoventilation: Secondary | ICD-10-CM

## 2013-10-09 LAB — CBC
HCT: 33.9 % — ABNORMAL LOW (ref 36.0–46.0)
Hemoglobin: 11 g/dL — ABNORMAL LOW (ref 12.0–15.0)
MCHC: 32.3 g/dL (ref 30.0–36.0)
MCV: 79.2 fl (ref 78.0–100.0)
PLATELETS: 241 10*3/uL (ref 150.0–400.0)
RBC: 4.28 Mil/uL (ref 3.87–5.11)
RDW: 16.8 % — ABNORMAL HIGH (ref 11.5–14.6)
WBC: 8.5 10*3/uL (ref 4.5–10.5)

## 2013-10-09 LAB — BASIC METABOLIC PANEL
BUN: 13 mg/dL (ref 6–23)
CALCIUM: 9.4 mg/dL (ref 8.4–10.5)
CO2: 25 mEq/L (ref 19–32)
Chloride: 101 mEq/L (ref 96–112)
Creatinine, Ser: 0.8 mg/dL (ref 0.4–1.2)
GFR: 88.69 mL/min (ref >60.00)
Glucose, Bld: 212 mg/dL — ABNORMAL HIGH (ref 70–99)
Potassium: 4.4 mEq/L (ref 3.5–5.1)
SODIUM: 134 meq/L — AB (ref 135–145)

## 2013-10-09 NOTE — Assessment & Plan Note (Signed)
Recent sleep study did not show evidence for sleep apnea.  Recent ONO showed oxygen desaturation.  She is to remain on 2 liters supplemental oxygen while asleep.  Explained importance of continuing oxygen therapy, and importance of weight loss.  Will repeat BMET to assess HCO3 and CBC to assess Hb.

## 2013-10-09 NOTE — Patient Instructions (Signed)
Lab tests today Follow up in 6 months 

## 2013-10-09 NOTE — Progress Notes (Signed)
Chief Complaint  Patient presents with  . Follow-up    Pt has been using O2 at night time with no problems.    History of Present Illness: Angelica Beck is a 63 y.o. female with obesity hypoventilation syndrome using 2 liters oxygen.  She has been using oxygen intermittently at night.  She is not sure how much this helping.  She still feels sleepy during the day, and will nap during the day >> she does not use her oxygen during naps.  TESTS: PSG 07/20/13 >> AHI 0.6, SpO2 low 88%, Spent 6.6 min with SpO2 < 90%, PLMI 58.5, decreased sleep time ONO with RA 07/30/13 >> Test time 9 hrs 47 min. Baseline SpO2 94%, low SpO2 68%. Spent 1 hr 6 min with SpO2 < 89%.   Angelica Beck  has a past medical history of Hypertension; Stroke; Arthritis; Diabetes mellitus, type 2; Hyperlipidemia; and Hyperthyroidism.  Angelica Beck  has past surgical history that includes Ventral hernia repair.  Prior to Admission medications   Medication Sig Start Date End Date Taking? Authorizing Provider  amLODipine (NORVASC) 10 MG tablet Take 1 tablet by mouth daily. 06/05/13  Yes Historical Provider, MD  cholecalciferol (VITAMIN D) 1000 UNITS tablet Take 2,000 Units by mouth daily.   Yes Historical Provider, MD  clopidogrel (PLAVIX) 75 MG tablet Take 1 tablet by mouth daily. 06/05/13  Yes Historical Provider, MD  CRESTOR 40 MG tablet Take 1 tablet by mouth daily. 06/05/13  Yes Historical Provider, MD  enalapril (VASOTEC) 20 MG tablet Take 1 tablet by mouth daily. 06/05/13  Yes Historical Provider, MD  LANTUS SOLOSTAR 100 UNIT/ML SOPN  06/05/13  Yes Historical Provider, MD  metFORMIN (GLUCOPHAGE) 500 MG tablet Take 1 tablet by mouth 2 (two) times daily. 06/05/13  Yes Historical Provider, MD  niacin (NIASPAN) 500 MG CR tablet Take 1 tablet by mouth 2 (two) times daily. 06/05/13  Yes Historical Provider, MD  NOVOLOG FLEXPEN 100 UNIT/ML SOPN FlexPen  06/05/13  Yes Historical Provider, MD  oxybutynin (DITROPAN XL) 15 MG 24  hr tablet Take 1 tablet by mouth daily. 06/05/13  Yes Historical Provider, MD  potassium chloride SA (K-DUR,KLOR-CON) 20 MEQ tablet Take 1 tablet by mouth daily. 06/05/13  Yes Historical Provider, MD  propylthiouracil (PTU) 50 MG tablet Take 1 tablet by mouth daily. 06/05/13  Yes Historical Provider, MD  ranitidine (ZANTAC) 150 MG tablet Take 1 tablet by mouth daily. 06/05/13  Yes Historical Provider, MD  traZODone (DESYREL) 50 MG tablet Take 1 tablet by mouth at bedtime. 06/05/13  Yes Historical Provider, MD    Allergies  Allergen Reactions  . Penicillins      Physical Exam:  General - No distress, hirsutism appearance ENT - No sinus tenderness, no oral exudate, no LAN Cardiac - s1s2 regular, no murmur Chest - No wheeze/rales/dullness Back - No focal tenderness Abd - Soft, non-tender Ext - No edema Neuro - Normal strength Skin - No rashes Psych - normal mood, and behavior   Assessment/Plan:  Angelica Mires, MD [facility]Sleep Pager:  [phone]

## 2013-10-15 ENCOUNTER — Ambulatory Visit (INDEPENDENT_AMBULATORY_CARE_PROVIDER_SITE_OTHER): Payer: Medicare Other | Admitting: Podiatrist

## 2013-10-15 ENCOUNTER — Telehealth: Payer: Self-pay | Admitting: Pulmonary Disease

## 2013-10-15 ENCOUNTER — Encounter: Payer: Self-pay | Admitting: Podiatrist

## 2013-10-15 VITALS — BP 114/72 | HR 113 | Resp 18

## 2013-10-15 DIAGNOSIS — M216X9 Other acquired deformities of unspecified foot: Secondary | ICD-10-CM

## 2013-10-15 DIAGNOSIS — M79609 Pain in unspecified limb: Secondary | ICD-10-CM

## 2013-10-15 DIAGNOSIS — B351 Tinea unguium: Secondary | ICD-10-CM

## 2013-10-15 DIAGNOSIS — Q828 Other specified congenital malformations of skin: Secondary | ICD-10-CM

## 2013-10-15 DIAGNOSIS — E1149 Type 2 diabetes mellitus with other diabetic neurological complication: Secondary | ICD-10-CM

## 2013-10-15 NOTE — Telephone Encounter (Signed)
atc voicemail is full, wcb x1

## 2013-10-15 NOTE — Progress Notes (Signed)
I going to have my toenails cut  HPI:  Patient presents today for follow up of foot and nail care. Denies any new complaints today.  Large calluses bilateral heels and submet 2 right  Objective:  Patients chart is reviewed.  Neurovascular status unchanged.  Patients nails are thickened, discolored, distrophic, friable and brittle with yellow-brown discoloration. Patient subjectively relates they are painful with shoes and with ambulation of bilateral feet.  Assessment:  Symptomatic onychomycosis  Plan:  Discussed treatment options and alternatives.  The symptomatic toenails were debrided through manual an mechanical means without complication.  Return appointment recommended at routine intervals of 3 months    Trudie Buckler, DPM

## 2013-10-15 NOTE — Telephone Encounter (Signed)
CBC    Component Value Date/Time   WBC 8.5 10/09/2013 1223   RBC 4.28 10/09/2013 1223   HGB 11.0* 10/09/2013 1223   HCT 33.9* 10/09/2013 1223   PLT 241.0 10/09/2013 1223   MCV 79.2 10/09/2013 1223   MCHC 32.3 10/09/2013 1223   RDW 16.8* 10/09/2013 1223    BMET    Component Value Date/Time   NA 134* 10/09/2013 1223   K 4.4 10/09/2013 1223   CL 101 10/09/2013 1223   CO2 25 10/09/2013 1223   GLUCOSE 212* 10/09/2013 1223   BUN 13 10/09/2013 1223   CREATININE 0.8 10/09/2013 1223   CALCIUM 9.4 10/09/2013 1223    Will have my nurse inform pt that labs look good except for elevated blood sugar.  She should speak with her PCP about whether she needs change to her diabetes regimen.  Otherwise no change to pulmonary regimen consisting of nocturnal oxygen.

## 2013-10-15 NOTE — Patient Instructions (Signed)
GENERAL FOOT HEALTH INFORMATION:  Moisturize your feet regularly with a cream based lotion such as Cetaphil (Cream) or Eucerin (Cream)- usually available in a tub or crock type of container.  Avoid applying the cream to the toe interspaces themselves to reduce the risk of a fungal infection between the toes.  After showering or bathing be sure to dry well between your toes.    Watch your toenails for any signs of infection including drainage, pus redness or swelling along the sides of the toenails.  Soak in epsom salt water and use antibiotic ointment (OTC) if you notice this start to occur.  If the redness does not resolve within 2-3 days, call for an appointment to be seen.  

## 2013-10-20 NOTE — Telephone Encounter (Signed)
Pt's daughter is aware of results.

## 2013-11-03 ENCOUNTER — Ambulatory Visit: Payer: Medicare Other | Admitting: *Deleted

## 2014-01-15 ENCOUNTER — Ambulatory Visit (INDEPENDENT_AMBULATORY_CARE_PROVIDER_SITE_OTHER): Payer: Medicare Other | Admitting: Podiatrist

## 2014-01-15 ENCOUNTER — Encounter: Payer: Self-pay | Admitting: Podiatrist

## 2014-01-15 VITALS — BP 148/78 | HR 98 | Resp 19 | Ht 67.0 in | Wt 265.0 lb

## 2014-01-15 DIAGNOSIS — M79609 Pain in unspecified limb: Secondary | ICD-10-CM

## 2014-01-15 DIAGNOSIS — B351 Tinea unguium: Secondary | ICD-10-CM

## 2014-01-15 DIAGNOSIS — E1149 Type 2 diabetes mellitus with other diabetic neurological complication: Secondary | ICD-10-CM

## 2014-01-15 DIAGNOSIS — M216X9 Other acquired deformities of unspecified foot: Secondary | ICD-10-CM

## 2014-01-15 DIAGNOSIS — Q828 Other specified congenital malformations of skin: Secondary | ICD-10-CM

## 2014-01-15 NOTE — Patient Instructions (Signed)
Diabetes and Foot Care Diabetes may cause you to have problems because of poor blood supply (circulation) to your feet and legs. This may cause the skin on your feet to become thinner, break easier, and heal more slowly. Your skin may become dry, and the skin may peel and crack. You may also have nerve damage in your legs and feet causing decreased feeling in them. You may not notice minor injuries to your feet that could lead to infections or more serious problems. Taking care of your feet is one of the most important things you can do for yourself.  HOME CARE INSTRUCTIONS  Wear shoes at all times, even in the house. Do not go barefoot. Bare feet are easily injured.  Check your feet daily for blisters, cuts, and redness. If you cannot see the bottom of your feet, use a mirror or ask someone for help.  Wash your feet with warm water (do not use hot water) and mild soap. Then pat your feet and the areas between your toes until they are completely dry. Do not soak your feet as this can dry your skin.  Apply a moisturizing lotion or petroleum jelly (that does not contain alcohol and is unscented) to the skin on your feet and to dry, brittle toenails. Do not apply lotion between your toes.  Trim your toenails straight across. Do not dig under them or around the cuticle. File the edges of your nails with an emery board or nail file.  Do not cut corns or calluses or try to remove them with medicine.  Wear clean socks or stockings every day. Make sure they are not too tight. Do not wear knee-high stockings since they may decrease blood flow to your legs.  Wear shoes that fit properly and have enough cushioning. To break in new shoes, wear them for just a few hours a day. This prevents you from injuring your feet. Always look in your shoes before you put them on to be sure there are no objects inside.  Do not cross your legs. This may decrease the blood flow to your feet.  If you find a minor scrape,  cut, or break in the skin on your feet, keep it and the skin around it clean and dry. These areas may be cleansed with mild soap and water. Do not cleanse the area with peroxide, alcohol, or iodine.  When you remove an adhesive bandage, be sure not to damage the skin around it.  If you have a wound, look at it several times a day to make sure it is healing.  Do not use heating pads or hot water bottles. They may burn your skin. If you have lost feeling in your feet or legs, you may not know it is happening until it is too late.  Make sure your health care provider performs a complete foot exam at least annually or more often if you have foot problems. Report any cuts, sores, or bruises to your health care provider immediately. SEEK MEDICAL CARE IF:   You have an injury that is not healing.  You have cuts or breaks in the skin.  You have an ingrown nail.  You notice redness on your legs or feet.  You feel burning or tingling in your legs or feet.  You have pain or cramps in your legs and feet.  Your legs or feet are numb.  Your feet always feel cold. SEEK IMMEDIATE MEDICAL CARE IF:   There is increasing redness,   swelling, or pain in or around a wound.  There is a red line that goes up your leg.  Pus is coming from a wound.  You develop a fever or as directed by your health care provider.  You notice a bad smell coming from an ulcer or wound. Document Released: 09/08/2000 Document Revised: 05/14/2013 Document Reviewed: 02/18/2013 [employer] Patient Information 2014 [employer].  

## 2014-01-18 NOTE — Progress Notes (Signed)
HPI: Patient presents today for follow up of foot and nail care. Denies any new complaints today. Large calluses bilateral heels and submet 2 right  Continue to be present and bothersome Objective: Patients chart is reviewed. Neurovascular status unchanged. Patients nails are thickened, discolored, distrophic, friable and brittle with yellow-brown discoloration. Patient subjectively relates they are painful with shoes and with ambulation of bilateral feet. Large keratotic lesions are present x 3 Assessment: Symptomatic onychomycosis , keratotic lesions x 3 Plan: Discussed treatment options and alternatives. The symptomatic toenails and lesions  were debrided without complication. Return appointment recommended at routine intervals of 3 months  Trudie Buckler, DPM

## 2014-01-19 ENCOUNTER — Ambulatory Visit (HOSPITAL_COMMUNITY): Payer: Medicare Other

## 2014-01-19 ENCOUNTER — Encounter (HOSPITAL_COMMUNITY): Payer: Self-pay | Admitting: Emergency Medicine

## 2014-01-19 ENCOUNTER — Observation Stay (HOSPITAL_COMMUNITY)
Admission: EM | Admit: 2014-01-19 | Discharge: 2014-01-21 | Disposition: A | Discharge status: Home / Self Care | Payer: Medicare Other | Attending: Internal Medicine | Admitting: Internal Medicine

## 2014-01-19 DIAGNOSIS — Z880 Allergy status to penicillin: Secondary | ICD-10-CM | POA: Insufficient documentation

## 2014-01-19 DIAGNOSIS — Z8673 Personal history of transient ischemic attack (TIA), and cerebral infarction without residual deficits: Secondary | ICD-10-CM | POA: Insufficient documentation

## 2014-01-19 DIAGNOSIS — R079 Chest pain, unspecified: Secondary | ICD-10-CM

## 2014-01-19 DIAGNOSIS — I2510 Atherosclerotic heart disease of native coronary artery without angina pectoris: Secondary | ICD-10-CM

## 2014-01-19 DIAGNOSIS — I10 Essential (primary) hypertension: Secondary | ICD-10-CM

## 2014-01-19 DIAGNOSIS — E118 Type 2 diabetes mellitus with unspecified complications: Secondary | ICD-10-CM

## 2014-01-19 DIAGNOSIS — E0590 Thyrotoxicosis, unspecified without thyrotoxic crisis or storm: Secondary | ICD-10-CM | POA: Insufficient documentation

## 2014-01-19 DIAGNOSIS — M129 Arthropathy, unspecified: Secondary | ICD-10-CM | POA: Insufficient documentation

## 2014-01-19 DIAGNOSIS — R5381 Other malaise: Secondary | ICD-10-CM | POA: Insufficient documentation

## 2014-01-19 DIAGNOSIS — E662 Morbid (severe) obesity with alveolar hypoventilation: Secondary | ICD-10-CM

## 2014-01-19 DIAGNOSIS — R739 Hyperglycemia, unspecified: Secondary | ICD-10-CM | POA: Diagnosis present

## 2014-01-19 DIAGNOSIS — Z87891 Personal history of nicotine dependence: Secondary | ICD-10-CM | POA: Insufficient documentation

## 2014-01-19 DIAGNOSIS — Z7902 Long term (current) use of antithrombotics/antiplatelets: Secondary | ICD-10-CM | POA: Insufficient documentation

## 2014-01-19 DIAGNOSIS — R5383 Other fatigue: Secondary | ICD-10-CM

## 2014-01-19 DIAGNOSIS — E119 Type 2 diabetes mellitus without complications: Secondary | ICD-10-CM | POA: Insufficient documentation

## 2014-01-19 DIAGNOSIS — Z79899 Other long term (current) drug therapy: Secondary | ICD-10-CM | POA: Insufficient documentation

## 2014-01-19 DIAGNOSIS — E785 Hyperlipidemia, unspecified: Secondary | ICD-10-CM

## 2014-01-19 DIAGNOSIS — R0789 Other chest pain: Principal | ICD-10-CM | POA: Insufficient documentation

## 2014-01-19 LAB — CBC
HCT: 36.3 % (ref 36.0–46.0)
HEMOGLOBIN: 11.9 g/dL — AB (ref 12.0–15.0)
MCH: 26.1 pg (ref 26.0–34.0)
MCHC: 32.8 g/dL (ref 30.0–36.0)
MCV: 79.6 fL (ref 78.0–100.0)
Platelets: 236 10*3/uL (ref 150–400)
RBC: 4.56 MIL/uL (ref 3.87–5.11)
RDW: 15.1 % (ref 11.5–15.5)
WBC: 7.9 10*3/uL (ref 4.0–10.5)

## 2014-01-19 LAB — GLUCOSE, CAPILLARY
GLUCOSE-CAPILLARY: 204 mg/dL — AB (ref 70–99)
Glucose-Capillary: 135 mg/dL — ABNORMAL HIGH (ref 70–99)

## 2014-01-19 LAB — PRO B NATRIURETIC PEPTIDE: PRO B NATRI PEPTIDE: 56.4 pg/mL (ref 0–125)

## 2014-01-19 LAB — BASIC METABOLIC PANEL
BUN: 11 mg/dL (ref 6–23)
CALCIUM: 9.6 mg/dL (ref 8.4–10.5)
CHLORIDE: 100 meq/L (ref 96–112)
CO2: 22 meq/L (ref 19–32)
CREATININE: 0.74 mg/dL (ref 0.50–1.10)
GFR calc Af Amer: >90 mL/min (ref >90)
GFR calc non Af Amer: 90 mL/min — ABNORMAL LOW (ref >90)
GLUCOSE: 131 mg/dL — AB (ref 70–99)
Potassium: 4.3 mEq/L (ref 3.7–5.3)
SODIUM: 138 meq/L (ref 137–147)

## 2014-01-19 LAB — I-STAT TROPONIN, ED: Troponin i, poc: 0 ng/mL (ref 0.00–0.08)

## 2014-01-19 MED ORDER — INSULIN GLARGINE 100 UNIT/ML ~~LOC~~ SOLN
15.0000 [IU] | Freq: Every day | SUBCUTANEOUS | Status: DC
  Administered 2014-01-19 – 2014-01-20 (×2): 15 [IU] via SUBCUTANEOUS
  Filled 2014-01-19 (×3): qty 0.15

## 2014-01-19 MED ORDER — NIACIN ER (ANTIHYPERLIPIDEMIC) 500 MG PO TBCR
500.0000 mg | EXTENDED_RELEASE_TABLET | Freq: Two times a day (BID) | ORAL | Status: DC
  Administered 2014-01-20 – 2014-01-21 (×3): 500 mg via ORAL
  Filled 2014-01-19 (×5): qty 1

## 2014-01-19 MED ORDER — PROPYLTHIOURACIL 50 MG PO TABS
50.0000 mg | ORAL_TABLET | Freq: Every day | ORAL | Status: DC
  Administered 2014-01-20 – 2014-01-21 (×2): 50 mg via ORAL
  Filled 2014-01-19 (×2): qty 1

## 2014-01-19 MED ORDER — HEPARIN SODIUM (PORCINE) 5000 UNIT/ML IJ SOLN
5000.0000 [IU] | Freq: Three times a day (TID) | INTRAMUSCULAR | Status: DC
  Administered 2014-01-20 – 2014-01-21 (×2): 5000 [IU] via SUBCUTANEOUS
  Filled 2014-01-19 (×8): qty 1

## 2014-01-19 MED ORDER — ENALAPRIL MALEATE 20 MG PO TABS
20.0000 mg | ORAL_TABLET | Freq: Every day | ORAL | Status: DC
  Administered 2014-01-20 – 2014-01-21 (×2): 20 mg via ORAL
  Filled 2014-01-19 (×2): qty 1

## 2014-01-19 MED ORDER — INSULIN ASPART 100 UNIT/ML ~~LOC~~ SOLN
0.0000 [IU] | SUBCUTANEOUS | Status: DC
  Administered 2014-01-20: 22:00:00 via SUBCUTANEOUS
  Administered 2014-01-20 – 2014-01-21 (×2): 3 [IU] via SUBCUTANEOUS
  Administered 2014-01-21: 2 [IU] via SUBCUTANEOUS

## 2014-01-19 MED ORDER — OXYBUTYNIN CHLORIDE ER 15 MG PO TB24
15.0000 mg | ORAL_TABLET | Freq: Every day | ORAL | Status: DC
  Administered 2014-01-20 – 2014-01-21 (×2): 15 mg via ORAL
  Filled 2014-01-19 (×2): qty 1

## 2014-01-19 MED ORDER — FAMOTIDINE 10 MG PO TABS
10.0000 mg | ORAL_TABLET | Freq: Every day | ORAL | Status: DC
  Administered 2014-01-19 – 2014-01-21 (×3): 10 mg via ORAL
  Filled 2014-01-19 (×3): qty 1

## 2014-01-19 MED ORDER — ONDANSETRON HCL 4 MG/2ML IJ SOLN: 4.0000 mg | Freq: Four times a day (QID) | INTRAMUSCULAR | Status: DC | PRN

## 2014-01-19 MED ORDER — TRAZODONE HCL 50 MG PO TABS
50.0000 mg | ORAL_TABLET | Freq: Every day | ORAL | Status: DC
  Administered 2014-01-19 – 2014-01-20 (×2): 50 mg via ORAL
  Filled 2014-01-19 (×3): qty 1

## 2014-01-19 MED ORDER — AMLODIPINE BESYLATE 10 MG PO TABS
10.0000 mg | ORAL_TABLET | Freq: Every day | ORAL | Status: DC
  Administered 2014-01-20 – 2014-01-21 (×2): 10 mg via ORAL
  Filled 2014-01-19 (×2): qty 1

## 2014-01-19 MED ORDER — ATORVASTATIN CALCIUM 80 MG PO TABS
80.0000 mg | ORAL_TABLET | Freq: Every day | ORAL | Status: DC
  Administered 2014-01-20 – 2014-01-21 (×2): 80 mg via ORAL
  Filled 2014-01-19 (×2): qty 1

## 2014-01-19 MED ORDER — POTASSIUM CHLORIDE CRYS ER 20 MEQ PO TBCR
20.0000 meq | EXTENDED_RELEASE_TABLET | Freq: Every day | ORAL | Status: DC
  Administered 2014-01-19 – 2014-01-21 (×3): 20 meq via ORAL
  Filled 2014-01-19 (×3): qty 1

## 2014-01-19 MED ORDER — CLOPIDOGREL BISULFATE 75 MG PO TABS
75.0000 mg | ORAL_TABLET | Freq: Every day | ORAL | Status: DC
  Administered 2014-01-19 – 2014-01-21 (×3): 75 mg via ORAL
  Filled 2014-01-19 (×3): qty 1

## 2014-01-19 MED ORDER — ACETAMINOPHEN 325 MG PO TABS: 650.0000 mg | ORAL_TABLET | ORAL | Status: DC | PRN

## 2014-01-19 NOTE — ED Provider Notes (Signed)
CSN: [id number]     Arrival date & time 01/19/14  1307 History   First MD Initiated Contact with Patient 01/19/14 1858     Chief Complaint  Patient presents with  . Chest Pain     (Consider location/radiation/quality/duration/timing/severity/associated sxs/prior Treatment) HPI Complains of intermittent chest tightness onset 2 days ago. Accompanied by shortness of breath no nausea sweatiness. No treatment prior to coming here nothing makes symptoms better or worse. Discomfort is anterior, nonradiating. Patient treated herself with her usual medicines including Plavix and aspirin. Is only asymptomatic. She reports to be seen by her cardiologist approximately 3 weeks to the office. Advised her that if she should have any chest discomfort that she should come to the hospital. Past Medical History  Diagnosis Date  . Hypertension   . Stroke   . Arthritis   . Diabetes mellitus, type 2   . Hyperlipidemia   . Hyperthyroidism   . Obesity hypoventilation syndrome 06/20/2013   coronary disease Past Surgical History  Procedure Laterality Date  . Ventral hernia repair     Family History  Problem Relation Age of Onset  . Diabetes Mother    History  Substance Use Topics  . Smoking status: Former Smoker -- 0.25 packs/day for 30 years    Types: Cigarettes    Quit date: 09/25/2002  . Smokeless tobacco: Never Used  . Alcohol Use: No   OB History   Grav Para Term Preterm Abortions TAB SAB Ect Mult Living                 Review of Systems  HENT: Negative.   Respiratory: Positive for shortness of breath.   Cardiovascular: Positive for chest pain.  Gastrointestinal: Negative.   Musculoskeletal: Negative.   Skin: Negative.   Neurological: Positive for weakness.       Chronic left-sided weakness this is of old stroke  Psychiatric/Behavioral: Negative.   All other systems reviewed and are negative.     Allergies  Penicillins  Home Medications   Prior to Admission medications    Medication Sig Start Date End Date Taking? Authorizing Provider  amLODipine (NORVASC) 10 MG tablet Take 1 tablet by mouth daily. 06/05/13  Yes Historical Provider, MD  cholecalciferol (VITAMIN D) 1000 UNITS tablet Take 2,000 Units by mouth daily.   Yes Historical Provider, MD  clopidogrel (PLAVIX) 75 MG tablet Take 1 tablet by mouth daily. 06/05/13  Yes Historical Provider, MD  CRESTOR 40 MG tablet Take 1 tablet by mouth daily. 06/05/13  Yes Historical Provider, MD  enalapril (VASOTEC) 20 MG tablet Take 1 tablet by mouth daily. 06/05/13  Yes Historical Provider, MD  LANTUS SOLOSTAR 100 UNIT/ML SOPN Inject 15-30 Units into the skin 2 (two) times daily. 15 in the morning and 30 units at night 06/05/13  Yes Historical Provider, MD  metFORMIN (GLUCOPHAGE) 500 MG tablet Take 1 tablet by mouth 2 (two) times daily. 06/05/13  Yes Historical Provider, MD  niacin (NIASPAN) 500 MG CR tablet Take 1 tablet by mouth 2 (two) times daily. 06/05/13  Yes Historical Provider, MD  NOVOLOG FLEXPEN 100 UNIT/ML SOPN FlexPen Inject 0-15 Units into the skin 3 (three) times daily with meals. Sliding scale 06/05/13  Yes Historical Provider, MD  oxybutynin (DITROPAN XL) 15 MG 24 hr tablet Take 1 tablet by mouth daily. 06/05/13  Yes Historical Provider, MD  potassium chloride SA (K-DUR,KLOR-CON) 20 MEQ tablet Take 1 tablet by mouth daily. 06/05/13  Yes Historical Provider, MD  propylthiouracil (PTU) 50 MG tablet  Take 1 tablet by mouth daily. 06/05/13  Yes Historical Provider, MD  ranitidine (ZANTAC) 150 MG tablet Take 1 tablet by mouth daily. 06/05/13  Yes Historical Provider, MD  traZODone (DESYREL) 50 MG tablet Take 1 tablet by mouth at bedtime. 06/05/13  Yes Historical Provider, MD   BP 137/62  Pulse 78  Temp(Src) 99 F (37.2 C) (Oral)  Resp 16  Ht 5\' 7"  (1.702 m)  Wt 261 lb 7 oz (118.587 kg)  BMI 40.94 kg/m2  SpO2 97% Physical Exam  Nursing note and vitals reviewed. Constitutional: She is oriented to person, place, and time.  She appears well-developed and well-nourished.  HENT:  Head: Normocephalic and atraumatic.  Eyes: Conjunctivae are normal. Pupils are equal, round, and reactive to light.  Neck: Neck supple. No tracheal deviation present. No thyromegaly present.  Cardiovascular: Normal rate and regular rhythm.   No murmur heard. Pulmonary/Chest: Effort normal and breath sounds normal.  Abdominal: Soft. Bowel sounds are normal. She exhibits no distension. There is no tenderness.  Obese  Musculoskeletal: Normal range of motion. She exhibits no edema and no tenderness.  Neurological: She is alert and oriented to person, place, and time. No cranial nerve deficit. Coordination normal.  Skin: Skin is warm and dry. No rash noted.  Psychiatric: She has a normal mood and affect.    ED Course  Procedures (including critical care time) Labs Review Labs Reviewed  CBC - Abnormal; Notable for the following:    Hemoglobin 11.9 (*)    All other components within normal limits  BASIC METABOLIC PANEL - Abnormal; Notable for the following:    Glucose, Bld 131 (*)    GFR calc non Af Amer 90 (*)    All other components within normal limits  PRO B NATRIURETIC PEPTIDE  I-STAT TROPOININ, ED    Imaging Review Dg Chest 2 View  01/19/2014   CLINICAL DATA:  Chest pain  EXAM: CHEST  2 VIEW  COMPARISON:  06/24/2010  FINDINGS: Upper normal heart size.  Mediastinal contours and pulmonary vascularity normal.  Lungs clear.  No pleural effusion or pneumothorax.  Probable EKG lead projects over the LEFT apex laterally.  Scattered endplate spur formation thoracic spine.  IMPRESSION: No acute abnormalities.   Electronically Signed   By: Lavonia Dana M.D.   On: 01/19/2014 14:13     EKG Interpretation   Date/Time:  Monday January 19 2014 13:17:17 EDT Ventricular Rate:  103 PR Interval:  188 QRS Duration: 86 QT Interval:  352 QTC Calculation: 461 R Axis:   -35 Text Interpretation:  Sinus tachycardia Left axis deviation Low voltage   QRS Cannot rule out Anterior infarct , age undetermined Abnormal ECG No  significant change since last tracing Confirmed by Winfred Leeds  MD, Scorpio Fortin  [phone]) on 01/19/2014 8:41:44 PM     Chest xray viewed by me Results for orders placed during the hospital encounter of 01/19/14  CBC      Result Value Ref Range   WBC 7.9  4.0 - 10.5 K/uL   RBC 4.56  3.87 - 5.11 MIL/uL   Hemoglobin 11.9 (*) 12.0 - 15.0 g/dL   HCT 36.3  36.0 - 46.0 %   MCV 79.6  78.0 - 100.0 fL   MCH 26.1  26.0 - 34.0 pg   MCHC 32.8  30.0 - 36.0 g/dL   RDW 15.1  11.5 - 15.5 %   Platelets 236  150 - 400 K/uL  BASIC METABOLIC PANEL      Result  Value Ref Range   Sodium 138  137 - 147 mEq/L   Potassium 4.3  3.7 - 5.3 mEq/L   Chloride 100  96 - 112 mEq/L   CO2 22  19 - 32 mEq/L   Glucose, Bld 131 (*) 70 - 99 mg/dL   BUN 11  6 - 23 mg/dL   Creatinine, Ser 0.74  0.50 - 1.10 mg/dL   Calcium 9.6  8.4 - 10.5 mg/dL   GFR calc non Af Amer 90 (*) >90 mL/min   GFR calc Af Amer >90  >90 mL/min  PRO B NATRIURETIC PEPTIDE      Result Value Ref Range   Pro B Natriuretic peptide (BNP) 56.4  0 - 125 pg/mL  I-STAT TROPOININ, ED      Result Value Ref Range   Troponin i, poc 0.00  0.00 - 0.08 ng/mL   Comment 3            Dg Chest 2 View  01/19/2014   CLINICAL DATA:  Chest pain  EXAM: CHEST  2 VIEW  COMPARISON:  06/24/2010  FINDINGS: Upper normal heart size.  Mediastinal contours and pulmonary vascularity normal.  Lungs clear.  No pleural effusion or pneumothorax.  Probable EKG lead projects over the LEFT apex laterally.  Scattered endplate spur formation thoracic spine.  IMPRESSION: No acute abnormalities.   Electronically Signed   By: Lavonia Dana M.D.   On: 01/19/2014 14:13    MDM  Spoke with Dr. Algernon Huxley who defers to internal medicine service. Spoke with Dr. Alcario Drought plan telemetry 23 hour observation Diagnosis #1chest pain #2hyperglycemia Final diagnoses:  None        Orlie Dakin, MD 01/19/14 2105

## 2014-01-19 NOTE — ED Notes (Signed)
Pt reports midsternal chest pain that started over the weekend. Reports some SOB with the pain. Denies any nausea or vomiting.

## 2014-01-19 NOTE — ED Notes (Signed)
Dr. Gardner at bedside 

## 2014-01-19 NOTE — H&P (Signed)
[facility] History and Physical  Angelica Beck IZT:[id number] DOB: 10-17-1950 DOA: 01/19/2014  Referring physician: EDP PCP: Antonietta Jewel, MD   Chief Complaint: Chest pain   HPI: Angelica Beck is a 63 y.o. female h/o CAD with stents, presents to ED with c/o Chest pain.  Pain is located in center of chest, onset 2 days ago, was accompanied by SOB but no nausea.  Worse with movement, better on Sunday when she stayed in bed all day.  Currently asymptomatic.  Review of Systems: Systems reviewed.  As above, otherwise negative  Past Medical History  Diagnosis Date  . Hypertension   . Stroke   . Arthritis   . Diabetes mellitus, type 2   . Hyperlipidemia   . Hyperthyroidism   . Obesity hypoventilation syndrome 06/20/2013   Past Surgical History  Procedure Laterality Date  . Ventral hernia repair     Social History:  reports that she quit smoking about 11 years ago. Her smoking use included Cigarettes. She has a 7.5 pack-year smoking history. She has never used smokeless tobacco. She reports that she does not drink alcohol or use illicit drugs.  Allergies  Allergen Reactions  . Penicillins Swelling    Family History  Problem Relation Age of Onset  . Diabetes Mother      Prior to Admission medications   Medication Sig Start Date End Date Taking? Authorizing Provider  amLODipine (NORVASC) 10 MG tablet Take 1 tablet by mouth daily. 06/05/13  Yes Historical Provider, MD  cholecalciferol (VITAMIN D) 1000 UNITS tablet Take 2,000 Units by mouth daily.   Yes Historical Provider, MD  clopidogrel (PLAVIX) 75 MG tablet Take 1 tablet by mouth daily. 06/05/13  Yes Historical Provider, MD  CRESTOR 40 MG tablet Take 1 tablet by mouth daily. 06/05/13  Yes Historical Provider, MD  enalapril (VASOTEC) 20 MG tablet Take 1 tablet by mouth daily. 06/05/13  Yes Historical Provider, MD  LANTUS SOLOSTAR 100 UNIT/ML SOPN Inject 15-30 Units into the skin 2 (two) times daily. 15 in the morning and  30 units at night 06/05/13  Yes Historical Provider, MD  metFORMIN (GLUCOPHAGE) 500 MG tablet Take 1 tablet by mouth 2 (two) times daily. 06/05/13  Yes Historical Provider, MD  niacin (NIASPAN) 500 MG CR tablet Take 1 tablet by mouth 2 (two) times daily. 06/05/13  Yes Historical Provider, MD  NOVOLOG FLEXPEN 100 UNIT/ML SOPN FlexPen Inject 0-15 Units into the skin 3 (three) times daily with meals. Sliding scale 06/05/13  Yes Historical Provider, MD  oxybutynin (DITROPAN XL) 15 MG 24 hr tablet Take 1 tablet by mouth daily. 06/05/13  Yes Historical Provider, MD  potassium chloride SA (K-DUR,KLOR-CON) 20 MEQ tablet Take 1 tablet by mouth daily. 06/05/13  Yes Historical Provider, MD  propylthiouracil (PTU) 50 MG tablet Take 1 tablet by mouth daily. 06/05/13  Yes Historical Provider, MD  ranitidine (ZANTAC) 150 MG tablet Take 1 tablet by mouth daily. 06/05/13  Yes Historical Provider, MD  traZODone (DESYREL) 50 MG tablet Take 1 tablet by mouth at bedtime. 06/05/13  Yes Historical Provider, MD   Physical Exam: Filed Vitals:   01/19/14 2100  BP:   Pulse: 91  Temp:   Resp: 19    BP 122/78  Pulse 91  Temp(Src) 99 F (37.2 C) (Oral)  Resp 19  Ht 5\' 7"  (1.702 m)  Wt 118.587 kg (261 lb 7 oz)  BMI 40.94 kg/m2  SpO2 100%  General Appearance:    Alert, oriented, no distress, appears stated age  Head:    Normocephalic, atraumatic  Eyes:    PERRL, EOMI, sclera non-icteric        Nose:   Nares without drainage or epistaxis. Mucosa, turbinates normal  Throat:   Moist mucous membranes. Oropharynx without erythema or exudate.  Neck:   Supple. No carotid bruits.  No thyromegaly.  No lymphadenopathy.   Back:     No CVA tenderness, no spinal tenderness  Lungs:     Clear to auscultation bilaterally, without wheezes, rhonchi or rales  Chest wall:    No tenderness to palpitation  Heart:    Regular rate and rhythm without murmurs, gallops, rubs  Abdomen:     Soft, non-tender, nondistended, normal bowel sounds, no  organomegaly  Genitalia:    deferred  Rectal:    deferred  Extremities:   No clubbing, cyanosis or edema.  Pulses:   2+ and symmetric all extremities  Skin:   Skin color, texture, turgor normal, no rashes or lesions  Lymph nodes:   Cervical, supraclavicular, and axillary nodes normal  Neurologic:   CNII-XII intact. Normal strength, sensation and reflexes      throughout    Labs on Admission:  Basic Metabolic Panel:  Recent Labs Lab 01/19/14 1325  NA 138  K 4.3  CL 100  CO2 22  GLUCOSE 131*  BUN 11  CREATININE 0.74  CALCIUM 9.6   Liver Function Tests: No results found for this basename: AST, ALT, ALKPHOS, BILITOT, PROT, ALBUMIN,  in the last 168 hours No results found for this basename: LIPASE, AMYLASE,  in the last 168 hours No results found for this basename: AMMONIA,  in the last 168 hours CBC:  Recent Labs Lab 01/19/14 1325  WBC 7.9  HGB 11.9*  HCT 36.3  MCV 79.6  PLT 236   Cardiac Enzymes: No results found for this basename: CKTOTAL, CKMB, CKMBINDEX, TROPONINI,  in the last 168 hours  BNP (last 3 results)  Recent Labs  01/19/14 1325  PROBNP 56.4   CBG: No results found for this basename: GLUCAP,  in the last 168 hours  Radiological Exams on Admission: Dg Chest 2 View  01/19/2014   CLINICAL DATA:  Chest pain  EXAM: CHEST  2 VIEW  COMPARISON:  06/24/2010  FINDINGS: Upper normal heart size.  Mediastinal contours and pulmonary vascularity normal.  Lungs clear.  No pleural effusion or pneumothorax.  Probable EKG lead projects over the LEFT apex laterally.  Scattered endplate spur formation thoracic spine.  IMPRESSION: No acute abnormalities.   Electronically Signed   By: Lavonia Dana M.D.   On: 01/19/2014 14:13    EKG: Independently reviewed.  Assessment/Plan Principal Problem:   Chest pain Active Problems:   DIAB W/UNS COMP TYPE II/UNS NOT STATED UNCNTRL   HYPERTENSION, BENIGN   1. Chest pain - HEART SCORE = 4.  Admitting to obs, keeping patient NPO  for possible stress test in AM, cycle enzymes, tele monitor. 2. DM - putting patient on lantus 15 BID instead of 15 am and 30 pm since she is NPO, also holding metformin, holding mealtime novolog and putting her on q4h low dose SSI. 3. HTN - continue home meds.  Code Status: Full  Family Communication: Family at bedside Disposition Plan: Admit to obs   Time spent: 50 min  [facility] Pager [phone]  If 7AM-7PM, please contact the day team taking care of the patient [email or web address] Password [facility] 01/19/2014, 9:31 PM

## 2014-01-19 NOTE — ED Notes (Signed)
Pt's family requesting food for pt. MD said pt ok to eat. Pt given sandwich and water. Denies difficulty eating/chewing.

## 2014-01-19 NOTE — ED Notes (Signed)
Apologized for wait time. Pt denies CP at this time. Pt in NAD.

## 2014-01-19 NOTE — ED Notes (Addendum)
Pt c/o central CP that started on Saturday, sts pain increases with movement, if she is still there is no pain. sts on Sunday she stayed in bed all day and didn't have any pain. Denies sob/diaphoretic/nasuea with the CP. Denies hx of CP. Denies trying to take any extra pain medicine for it, sts she takes a daily pain pill for chronic pain. Denies any other symptoms. Reports she has hx of HTN and has been taking her medication for that. Nad, skin warm and dry, resp e/u.

## 2014-01-20 ENCOUNTER — Observation Stay (HOSPITAL_COMMUNITY): Payer: Medicare Other

## 2014-01-20 DIAGNOSIS — I2510 Atherosclerotic heart disease of native coronary artery without angina pectoris: Secondary | ICD-10-CM

## 2014-01-20 DIAGNOSIS — E662 Morbid (severe) obesity with alveolar hypoventilation: Secondary | ICD-10-CM

## 2014-01-20 DIAGNOSIS — E785 Hyperlipidemia, unspecified: Secondary | ICD-10-CM

## 2014-01-20 LAB — GLUCOSE, CAPILLARY
GLUCOSE-CAPILLARY: 126 mg/dL — AB (ref 70–99)
Glucose-Capillary: 89 mg/dL (ref 70–99)
Glucose-Capillary: 103 mg/dL — ABNORMAL HIGH (ref 70–99)
Glucose-Capillary: 219 mg/dL — ABNORMAL HIGH (ref 70–99)
Glucose-Capillary: 173 mg/dL — ABNORMAL HIGH (ref 70–99)

## 2014-01-20 LAB — TROPONIN I: Troponin I: <0.3 ng/mL (ref <0.30)

## 2014-01-20 MED ORDER — METOPROLOL SUCCINATE ER 25 MG PO TB24
25.0000 mg | ORAL_TABLET | Freq: Every day | ORAL | Status: DC
  Administered 2014-01-20 – 2014-01-21 (×2): 25 mg via ORAL
  Filled 2014-01-20 (×2): qty 1

## 2014-01-20 MED ORDER — REGADENOSON 0.4 MG/5ML IV SOLN
0.4000 mg | Freq: Once | INTRAVENOUS | Status: AC
  Filled 2014-01-20: qty 5

## 2014-01-20 MED ORDER — TECHNETIUM TC 99M SESTAMIBI GENERIC - CARDIOLITE
30.0000 | Freq: Once | INTRAVENOUS | Status: AC | PRN
  Administered 2014-01-20: 30 via INTRAVENOUS

## 2014-01-20 MED ORDER — REGADENOSON 0.4 MG/5ML IV SOLN
INTRAVENOUS | Status: AC
  Filled 2014-01-20: qty 5

## 2014-01-20 NOTE — Progress Notes (Signed)
UR Completed.  Angelica Beck [name] [phone] 01/20/2014  

## 2014-01-20 NOTE — Consult Note (Signed)
CARDIOLOGY CONSULT NOTE  Patient ID: Angelica Beck MRN: [id number] DOB/AGE: 61-11-1950 61 y.o.  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 Patient states that on Friday she started having chest tightness in the middle of the chest. It is continuous and lasted the whole day. On Saturday she continued to have chest pain on and off, states that she did not take any medications. On Sunday she very much stating that and felt relief of chest pain. She had no recurrence since then. On Monday morning she complained of mild discomfort, but stated that overall she was feeling well. On the insistence of her daughter, she was sent over for the evaluation to the emergency room.  Patient states that she has not had any chest pain since hospitalization, presently has walked to the bathroom with help of a walker without any chest discomfort. She does complain of chronic shortness breath and dyspnea on exertion, denies any PND. She had noticed some leg edema about a week ago. Denies any focal neurological deficit. Denies any painful swelling of the lower extremity. No dizziness or syncope. She does not know anything about nitroglycerin and states that she did not take any medications for chest pain. Chest pain is described as continuous. No other associated symptoms of nausea, vomiting or diaphoresis.  Past Medical History  Diagnosis Date  . Hypertension   . Stroke   . Arthritis   . Diabetes mellitus, type 2   . Hyperlipidemia   . Hyperthyroidism   . Obesity hypoventilation syndrome 06/20/2013     Past Surgical History  Procedure Laterality Date   . Ventral hernia repair       Family History  Problem Relation Age of Onset  . Diabetes Mother      Social History: History   Social History  . Marital Status: Single    Spouse Name: N/A    Number of Children: N/A  . Years of Education: N/A   Occupational History  . Not on file.   Social History Main Topics  . Smoking status: Former Smoker -- 0.25 packs/day for 30 years    Types: Cigarettes    Quit date: 09/25/2002  . Smokeless tobacco: Never Used  . Alcohol Use: No  . Drug Use: No  . Sexual Activity: Not on file   Other Topics Concern  . Not on file   Social History Narrative  . No narrative on file     Prescriptions prior to admission  Medication Sig Dispense Refill  . amLODipine (NORVASC) 10 MG tablet Take 1 tablet by mouth daily.      . cholecalciferol (VITAMIN D) 1000 UNITS tablet Take 2,000 Units by mouth daily.      . clopidogrel (PLAVIX) 75 MG tablet Take 1 tablet by mouth daily.      . CRESTOR 40 MG tablet Take 1 tablet by mouth daily.      . enalapril (VASOTEC) 20 MG tablet Take 1 tablet by mouth daily.      Marland Kitchen LANTUS SOLOSTAR 100 UNIT/ML SOPN Inject 15-30 Units into the skin 2 (two) times daily. 15 in the morning and 30 units at night      .  metFORMIN (GLUCOPHAGE) 500 MG tablet Take 1 tablet by mouth 2 (two) times daily.      . niacin (NIASPAN) 500 MG CR tablet Take 1 tablet by mouth 2 (two) times daily.      Marland Kitchen NOVOLOG FLEXPEN 100 UNIT/ML SOPN FlexPen Inject 0-15 Units into the skin 3 (three) times daily with meals. Sliding scale      . oxybutynin (DITROPAN XL) 15 MG 24 hr tablet Take 1 tablet by mouth daily.      . potassium chloride SA (K-DUR,KLOR-CON) 20 MEQ tablet Take 1 tablet by mouth daily.      Marland Kitchen propylthiouracil (PTU) 50 MG tablet Take 1 tablet by mouth daily.      . ranitidine (ZANTAC) 150 MG tablet Take 1 tablet by mouth daily.      . traZODone (DESYREL) 50 MG tablet Take 1 tablet by mouth at bedtime.        Scheduled Meds: . amLODipine   10 mg Oral Daily  . atorvastatin  80 mg Oral q1800  . clopidogrel  75 mg Oral Daily  . enalapril  20 mg Oral Daily  . famotidine  10 mg Oral Daily  . heparin  5,000 Units Subcutaneous 3 times per day  . insulin aspart  0-9 Units Subcutaneous 6 times per day  . insulin glargine  15 Units Subcutaneous QHS  . niacin  500 mg Oral BID  . oxybutynin  15 mg Oral Daily  . potassium chloride SA  20 mEq Oral Daily  . propylthiouracil  50 mg Oral Daily  . traZODone  50 mg Oral QHS   Continuous Infusions:  PRN Meds:.acetaminophen, ondansetron (ZOFRAN) IV  ROS: General: no fevers/chills/night sweats Eyes: no blurry vision, diplopia, or amaurosis ENT: no sore throat or hearing loss Resp: no cough, wheezing, or hemoptysis, has chronic shortness of breath. GI: no abdominal pain, nausea, vomiting, diarrhea, or constipation GU: no dysuria, frequency, or hematuria Skin: no rash Neuro: no headache, numbness, tingling, or new weakness of extremities Heme: no bleeding, DVT, or easy bruising Endo: no polydipsia or polyuria  Physical Exam: Blood pressure 127/86, pulse 72, temperature 98.2 F (36.8 C), temperature source Oral, resp. rate 18, height 5\' 7"  (1.702 m), weight 117.073 kg (258 lb 1.6 oz), SpO2 99.00%.    General: Short stature build and morbidly obese body habitus who is in no acute distress. Appears older than stated age. Alert Ox3.   There is no cyanosis. HEENT: normal limits.  No JVD. Large firm Goitre present. Short neck.  CARDIAC EXAM: S1, S2 normal, no gallop present. No murmur.   CHEST EXAM: No tenderness of chest wall. LUNGS: Clear to percuss and auscultate.  ABDOMEN: Pannus present. No hepatosplenomegaly. BS normal in all 4 quadrants. Abdomen is non-tender.   EXTREMITY: Full range of movementes, trace left ankle pitting edema. MUSCULOSKELETAL EXAM: Intact with full range of motion in all 4 extremities.   NEUROLOGIC EXAM: Grossly intact without any focal deficits.  Alert O x 3.   VASCULAR EXAM: No skin breakdown. Carotids normal. Extremities: Femoral pulse felt but distal, no bruit, Popliteal pulse distant and feeble due to bodily habitus ; Pedal pulse normal. Otherwise No obvious prominent pulse felt in the abdomen. Labs:   Lab Results  Component Value Date   WBC 7.9 01/19/2014   HGB 11.9* 01/19/2014   HCT 36.3 01/19/2014   MCV 79.6 01/19/2014   PLT 236 01/19/2014    Recent Labs Lab 01/19/14 1325  NA 138  K 4.3  CL 100  CO2 22  BUN 11  CREATININE 0.74  CALCIUM 9.6  GLUCOSE 131*   Lab Results  Component Value Date   CKTOTAL 68 06/25/2010   CKMB 2.3 06/25/2010   TROPONINI <0.30 01/20/2014    Lipid Panel     Component Value Date/Time   CHOL  Value: 168        ATP III CLASSIFICATION:  <200     mg/dL   Desirable  200-239  mg/dL   Borderline High  >=240    mg/dL   High        07/04/2009 0115   TRIG 72 07/04/2009 0115   HDL 38* 07/04/2009 0115   CHOLHDL 4.4 07/04/2009 0115   VLDL 14 07/04/2009 0115   LDLCALC  Value: 116        Total Cholesterol/HDL:CHD Risk Coronary Heart Disease Risk Table                     Men   Women  1/2 Average Risk   3.4   3.3  Average Risk       5.0   4.4  2 X Average Risk   9.6   7.1  3 X Average Risk  23.4   11.0        Use the calculated Patient Ratio above and the CHD Risk Table to determine the patient's CHD Risk.        ATP III CLASSIFICATION (LDL):  <100     mg/dL   Optimal  100-129  mg/dL   Near or Above                    Optimal  130-159  mg/dL   Borderline  160-189  mg/dL   High  >190     mg/dL   Very High* 07/04/2009 0115    EKG: 01/19/2014: Sinus tachycardia at a rate of 107 beats a minute with first-degree AV block, left axis deviation, left anterior fascicular block. Poor progression. Cannot exclude inferior infarct, anterior infarct, old. No evidence of ischemia. Compared to 06/28/2009, except for tachycardia no significant change.    Radiology: Dg Chest 2 View  01/19/2014   CLINICAL DATA:  Chest  pain  EXAM: CHEST  2 VIEW  COMPARISON:  06/24/2010  FINDINGS: Upper normal heart size.  Mediastinal contours and pulmonary vascularity normal.  Lungs clear.  No pleural effusion or pneumothorax.  Probable EKG lead projects over the LEFT apex laterally.  Scattered endplate spur formation thoracic spine.  IMPRESSION: No acute abnormalities.   Electronically Signed   By: Lavonia Dana M.D.   On: 01/19/2014 14:13   Outpatient cardiac evaluation: Study Date:  September 20, 2012 12:04 PM [facility] Study Report 1. Resting EKG shows NSR, poor R wave progression, no ischemia. Stress EKG is non diagnostic or ischemia as it is a pharmacologic stress. Patient remained asymptomatic and no additional EKG changes. 2. Perfusion imaging study demonstrates gut uptake artifact without evidence of ischemia or scar. The left ventricular ejection function was normal. This represents a low risk study. September 22, 2012 11:07 AM  Echo- 09/19/12 1.Left ventricular cavity is normal in size. Normal global wall motion.Normal systolic global function.Calculated EF 69%. 2.Mild aortic valve leaflet thickening.  3. Trace mitral regurgitation. 4. Trace tricuspid regurgitation.   ASSESSMENT AND PLAN:  1. Chest pain is clearly atypical for angina pectoris, patient has multiple cardiovascular risk factors. Cardiac markers were negative microinjury. 2. Diabetes mellitus type 2 uncontrolled  3. History of old stroke without any residual defects 4. Hypertension 5. Morbid obesity  Recommendation: Chest pain clearly appears to be atypical. We'll set her up for a Lexiscan cardiac stress test today, if negative or low risk she can be discharged home from cardiac standpoint. Would recommend discharging with sublingual nitroglycerin for when necessary use. Otherwise continue present medical therapy. She's on appropriate medical therapy. I would add a low-dose of beta blocker, metoprolol succinate 25 mg by mouth daily. I  will see her back in the office if the stress test is negative in 2 weeks.  Laverda Page, MD 01/20/2014, 9:35 AM [facility]. [place] Pager: [phone] Office: [phone] If no answer Cell [phone]

## 2014-01-20 NOTE — Progress Notes (Signed)
Patient ID: Angelica Beck  female  DPO:[id number]    DOB: 20-May-1951    DOA: 01/19/2014  PCP: Antonietta Jewel, MD  Assessment/Plan: Principal Problem:   Chest pain - Ruled out for acute ACS, patient has risk factors of diabetes uncontrolled, morbid obesity, remote CAD, having 3 days of chest discomfort. Cardiology consulted. 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 Einar Gip  Procedures:  Nuclear medicine stress test  Antibiotics:  None    Subjective: Still complaining of chest discomfort, no shortness of breath, no fevers or chills any nausea vomiting, prior history of CVA  Objective: Weight change:  No intake or output data in the 24 hours ending 01/20/14 1304 Blood pressure 127/86, pulse 72, temperature 98.2 F (36.8 C), temperature source Oral, resp. rate 18, height 5\' 7"  (1.702 m), weight 117.073 kg (258 lb 1.6 oz), SpO2 99.00%.  Physical Exam: General: Alert and awake, oriented x3, not in any acute distress. CVS: S1-S2 clear, no murmur rubs or gallops Chest: clear to auscultation bilaterally, no wheezing, rales or rhonchi Abdomen: soft nontender, nondistended, normal bowel sounds  Extremities: no cyanosis, clubbing or edema noted bilaterally Neuro: Left lower extremity hemiparesis   Lab Results: Basic Metabolic Panel:  Recent Labs Lab 01/19/14 1325  NA 138  K 4.3  CL 100  CO2 22  GLUCOSE 131*  BUN 11  CREATININE 0.74  CALCIUM 9.6   Liver  Function Tests: No results found for this basename: AST, ALT, ALKPHOS, BILITOT, PROT, ALBUMIN,  in the last 168 hours No results found for this basename: LIPASE, AMYLASE,  in the last 168 hours No results found for this basename: AMMONIA,  in the last 168 hours CBC:  Recent Labs Lab 01/19/14 1325  WBC 7.9  HGB 11.9*  HCT 36.3  MCV 79.6  PLT 236   Cardiac Enzymes:  Recent Labs Lab 01/19/14 2305 01/20/14 0620  TROPONINI <0.30 <0.30   BNP: No components found with this basename: POCBNP,  CBG:  Recent Labs Lab 01/19/14 2152 01/19/14 2341 01/20/14 0419 01/20/14 0732 01/20/14 1155  GLUCAP 135* 204* 89 103* 126*     Micro Results: No results found for this or any previous visit (from the past 240 hour(s)).  Studies/Results: Dg Chest 2 View  01/19/2014   CLINICAL DATA:  Chest pain  EXAM: CHEST  2 VIEW  COMPARISON:  06/24/2010  FINDINGS: Upper normal heart size.  Mediastinal contours and pulmonary vascularity normal.  Lungs clear.  No pleural effusion or pneumothorax.  Probable EKG lead projects over the LEFT apex laterally.  Scattered endplate spur formation thoracic spine.  IMPRESSION: No acute abnormalities.   Electronically Signed   By: Lavonia Dana M.D.   On: 01/19/2014 14:13    Medications: Scheduled Meds: . amLODipine  10 mg Oral Daily  . atorvastatin  80 mg Oral q1800  . clopidogrel  75 mg Oral Daily  . enalapril  20 mg Oral Daily  . famotidine  10 mg Oral Daily  . heparin  5,000 Units Subcutaneous 3 times per  day  . insulin aspart  0-9 Units Subcutaneous 6 times per day  . insulin glargine  15 Units Subcutaneous QHS  . metoprolol succinate  25 mg Oral Daily  . niacin  500 mg Oral BID  . oxybutynin  15 mg Oral Daily  . potassium chloride SA  20 mEq Oral Daily  . propylthiouracil  50 mg Oral Daily  . regadenoson      . traZODone  50 mg Oral QHS      LOS: 1 day   Ripudeep Krystal Eaton M.D. [facility] 01/20/2014, 1:04 PM Pager: [phone]  If 7PM-7AM,  please contact night-coverage [email or web address] Password [email or web address]  **Disclaimer: This note was dictated with voice recognition software. Similar sounding words can inadvertently be transcribed and this note may contain transcription errors which may not have been corrected upon publication of note.**

## 2014-01-21 ENCOUNTER — Observation Stay (HOSPITAL_COMMUNITY): Payer: Medicare Other

## 2014-01-21 LAB — GLUCOSE, CAPILLARY
GLUCOSE-CAPILLARY: 86 mg/dL (ref 70–99)
GLUCOSE-CAPILLARY: 106 mg/dL — AB (ref 70–99)
GLUCOSE-CAPILLARY: 228 mg/dL — AB (ref 70–99)
Glucose-Capillary: 116 mg/dL — ABNORMAL HIGH (ref 70–99)
Glucose-Capillary: 152 mg/dL — ABNORMAL HIGH (ref 70–99)

## 2014-01-21 MED ORDER — NITROGLYCERIN 0.4 MG SL SUBL: 0.4000 mg | SUBLINGUAL_TABLET | SUBLINGUAL | Status: DC | PRN

## 2014-01-21 MED ORDER — METOPROLOL SUCCINATE ER 25 MG PO TB24: 25.0000 mg | ORAL_TABLET | Freq: Every day | ORAL | Status: DC

## 2014-01-21 MED ORDER — ASPIRIN EC 81 MG PO TBEC: 81.0000 mg | DELAYED_RELEASE_TABLET | Freq: Every day | ORAL | Status: DC

## 2014-01-21 MED ORDER — TECHNETIUM TC 99M SESTAMIBI GENERIC - CARDIOLITE
30.0000 | Freq: Once | INTRAVENOUS | Status: AC | PRN
  Administered 2014-01-21: 30 via INTRAVENOUS

## 2014-01-21 NOTE — Progress Notes (Signed)
Subjective:  No further chest pain since admission. Feels well.  Objective:  Vital Signs in the last 24 hours: Temp:  [98.3 F (36.8 C)-98.9 F (37.2 C)] 98.3 F (36.8 C) (04/29 0426) Pulse Rate:  [87-93] 87 (04/29 0426) Resp:  [20] 20 (04/29 0426) BP: (115-135)/(58) 115/58 mmHg (04/29 0426) SpO2:  [96 %-99 %] 99 % (04/29 0426) Weight:  [117.103 kg (258 lb 2.6 oz)] 117.103 kg (258 lb 2.6 oz) (04/29 0426)  Intake/Output from previous day: 04/28 0701 - 04/29 0700 In: 600 [P.O.:600] Out: -   Physical Exam:  General: Short stature build and morbidly obese body habitus who is in no acute distress. Appears older than stated age. Alert Ox3.  There is no cyanosis. HEENT: normal limits. No JVD. Large firm Goitre present. Short neck.  CARDIAC EXAM: S1, S2 normal, no gallop present. No murmur.  CHEST EXAM: No tenderness of chest wall. LUNGS: Clear to percuss and auscultate.  ABDOMEN: Pannus present. No hepatosplenomegaly. BS normal in all 4 quadrants. Abdomen is non-tender.  EXTREMITY: Trace left ankle pitting edema. MUSCULOSKELETAL EXAM: Intact with full range of motion in all 4 extremities (uses cane and or a walker for support).    Lab Results: BMP  Recent Labs  10/09/13 1223 01/19/14 1325  NA 134* 138  K 4.4 4.3  CL 101 100  CO2 25 22  GLUCOSE 212* 131*  BUN 13 11  CREATININE 0.8 0.74  CALCIUM 9.4 9.6  GFRNONAA  --  90*  GFRAA  --  >90    CBC  Recent Labs Lab 01/19/14 1325  WBC 7.9  RBC 4.56  HGB 11.9*  HCT 36.3  PLT 236  MCV 79.6  MCH 26.1  MCHC 32.8  RDW 15.1    HEMOGLOBIN A1C Lab Results  Component Value Date   HGBA1C  Value: 7.7 (NOTE)                                                                       According to the ADA Clinical Practice Recommendations for 2011, when HbA1c is used as a screening test:   >=6.5%   Diagnostic of Diabetes Mellitus           (if abnormal result  is confirmed)  5.7-6.4%   Increased risk of developing Diabetes Mellitus   References:Diagnosis and Classification of Diabetes Mellitus,Diabetes NGEX,5284,13(KGMWN 1):S62-S69 and Standards of Medical Care in         Diabetes - 2011,Diabetes UUVO,5366,44  (Suppl 1):S11-S61.* 06/24/2010   MPG 174* 06/24/2010    Cardiac Panel (last 3 results)  Recent Labs  01/19/14 2305 01/20/14 0620  TROPONINI <0.30 <0.30    BNP (last 3 results)  Recent Labs  01/19/14 1325  PROBNP 56.4    Cardiac Studies:  EKG: 01/19/2014: Sinus tachycardia at a rate of 107 beats a minute with first-degree AV block, left axis deviation, left anterior fascicular block. Poor progression. Cannot exclude inferior infarct, anterior infarct, old. No evidence of ischemia. Compared to 06/28/2009, except for tachycardia no significant change.  Lexiscan Cardiolite stress test, 2 day protocol 01/20/2014: There is a large scar involving the inferior wall extending into the septum, apex, and lateral walls. There is peri-infarct ischemia in the septum. Ejection fraction 38%. Compared  to 09/22/2012  Perfusion imaging study demonstrates gut uptake artifact without evidence of ischemia or scar. The left ventricular ejection function was normal. This represents a low risk study.   Assessment/Plan:  1.  Atypical chest pain, however  There is a abnormal stress test revealing large scar in the inferior wall with reduced ejection fraction with very minimal peri-infarct inferoseptal ischemia.  Ejection fraction 38%. 2.  History of coronary artery disease, details not available.  History of stenting in 2009 and 2005, again details are not available. 3. Diabetes mellitus type 2 uncontrolled  4. History of old stroke without any residual defects  5. Hypertension  6. Morbid obesity  Recommendation: After evaluation of the stress test and her symptoms, cardiac  Markers are completely negative for myocardial injury, if patient indeed did have a myocardial infarction, it has happened sometime in the past.  Overall the  ischemic burden appears to be small.  Hence I would like to recommend aggressive medical therapy for now, we have started her on a beta blocker, continue the same.  I will see her back in the office and further discuss options.  Given her multiple medical comorbidities, my preference is to proceed  With medical therapy and unless she has recurrent symptoms of angina or evidence of congestive heart failure then proceed with cardiac catheterization.  I will certainly obtain an echocardiogram in the outpatient setting to conform wall motion abnormality.    Laverda Page, M.D. 01/21/2014, 2:14 PM [facility], [place] Pager: [phone] Office: [phone] If no answer: [phone]

## 2014-01-21 NOTE — Discharge Instructions (Signed)
Chest Pain Observation It is often hard to give a specific diagnosis for the cause of chest pain. Among other possibilities your symptoms might be caused by inadequate oxygen delivery to your heart (angina). Angina that is not treated or evaluated can lead to a heart attack (myocardial infarction) or death. Blood tests, electrocardiograms, and X-rays may have been done to help determine a possible cause of your chest pain. After evaluation and observation, your health care provider has determined that it is unlikely your pain was caused by an unstable condition that requires hospitalization. However, a full evaluation of your pain may need to be completed, with additional diagnostic testing as directed. It is very important to keep your follow-up appointments. Not keeping your follow-up appointments could result in permanent heart damage, disability, or death. If there is any problem keeping your follow-up appointments, you must call your health care provider. HOME CARE INSTRUCTIONS  Due to the slight chance that your pain could be angina, it is important to follow your health care provider's treatment plan and also maintain a healthy lifestyle:  Maintain or work toward achieving a healthy weight.  Stay physically active and exercise regularly.  Decrease your salt intake.  Eat a balanced, healthy diet. Talk to a dietician to learn about heart healthy foods.  Increase your fiber intake by including whole grains, vegetables, fruits, and nuts in your diet.  Avoid situations that cause stress, anger, or depression.  Take medicines as advised by your health care provider. Report any side effects to your health care provider. Do not stop medicines or adjust the dosages on your own.  Quit smoking. Do not use nicotine patches or gum until you check with your health care provider.  Keep your blood pressure, blood sugar, and cholesterol levels within normal limits.  Limit alcohol intake to no more than  1 drink per day for women that are not pregnant and 2 drinks per day for men.  Do not abuse drugs. SEEK IMMEDIATE MEDICAL CARE IF: You have severe chest pain or pressure which may include symptoms such as:  You feel pain or pressure in you arms, neck, jaw, or back.  You have severe back or abdominal pain, feel sick to your stomach (nauseous), or throw up (vomit).  You are sweating profusely.  You are having a fast or irregular heartbeat.  You feel short of breath while at rest.  You notice increasing shortness of breath during rest, sleep, or with activity.  You have chest pain that does not get better after rest or after taking your usual medicine.  You wake from sleep with chest pain.  You are unable to sleep because you cannot breathe.  You develop a frequent cough or you are coughing up blood.  You feel dizzy, faint, or experience extreme fatigue.  You develop severe weakness, dizziness, fainting, or chills. Any of these symptoms may represent a serious problem that is an emergency. Do not wait to see if the symptoms will go away. Call your local emergency services (911 in the [place] Do not drive yourself to the hospital. MAKE SURE YOU:  Understand these instructions.  Will watch your condition.  Will get help right away if you are not doing well or get worse. Document Released: 10/14/2010 Document Revised: 05/14/2013 Document Reviewed: 03/13/2013 [facility] Patient Information 2014 [place], [place].

## 2014-01-21 NOTE — Discharge Summary (Signed)
Physician Discharge Summary  Angelica Beck VPX:[id number] DOB: 25-Mar-1951 DOA: 01/19/2014  PCP: Antonietta Jewel, MD  Admit date: 01/19/2014 Discharge date: 01/21/2014  Time spent: 35 minutes  Recommendations for Outpatient Follow-up:  1. Needs to follow up with Dr Einar Gip for further evaluation.   Discharge Diagnoses:    Chest pain   DIAB W/UNS COMP TYPE II/UNS NOT STATED UNCNTRL   HYPERTENSION, BENIGN   Discharge Condition: stable.   Diet recommendation: Heart Healthy  Filed Weights   01/19/14 1326 01/19/14 2155 01/21/14 0426  Weight: 118.587 kg (261 lb 7 oz) 117.073 kg (258 lb 1.6 oz) 117.103 kg (258 lb 2.6 oz)    History of present illness:  Angelica Beck is a 63 y.o. female h/o CAD with stents, presents to ED with c/o Chest pain. Pain is located in center of chest, onset 2 days ago, was accompanied by SOB but no nausea. Worse with movement, better on Sunday when she stayed in bed all day. Currently asymptomatic.   Hospital Course:  Chest pain  - Ruled out for acute ACS, patient has risk factors of diabetes uncontrolled, morbid obesity, remote CAD, having 3 days of chest discomfort. Cardiology consulted. Recommended nuclear medicine stress test which showed There is a large scar involving the inferior wall extending into the septum, apex, and lateral walls. There is peri-infarct ischemia in the septum. Reviewed results with Dr Einar Gip, plan to continue with medical management, metoprolol, plavix, ace, statins. Patient to follow up with Dr Einar Gip in 1 to 2 weeks. Will provide prescription for nitroglycerin PRN. Patient chest pain free since admission.   DIAB W/UNS COMP TYPE II/UNS NOT STATED UNCNTRL  -Continue sliding scale insulin   HYPERTENSION, BENIGN  - Currently stable  -hold Norvasc to avoid hypotension.   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 There is peri-infarct ischemia in  the septum.    Consultations:  Dr Einar Gip  Discharge Exam: Filed Vitals:   01/21/14 1423  BP: 143/81  Pulse: 78  Temp: 98.3 F (36.8 C)  Resp: 19    General: no distress.  Cardiovascular: S 1, S 2 RRR Respiratory: CTA  Discharge Instructions You were cared for by a hospitalist during your hospital stay. If you have any questions about your discharge medications or the care you received while you were in the hospital after you are discharged, you can call the unit and asked to speak with the hospitalist on call if the hospitalist that took care of you is not available. Once you are discharged, your primary care physician will handle any further medical issues. Please note that NO REFILLS for any discharge medications will be authorized once you are discharged, as it is imperative that you return to your primary care physician (or establish a relationship with a primary care physician if you do not have one) for your aftercare needs so that they can reassess your need for medications and monitor your lab values.      Discharge Orders   Future Appointments Provider Department Dept Phone   04/16/2014 3:15 PM Trudie Buckler, [place] [facility] [place]   Future Orders Complete By Expires   Diet - low sodium heart healthy  As directed    Increase activity slowly  As directed        Medication List  STOP taking these medications       amLODipine 10 MG tablet  Commonly known as:  NORVASC      TAKE these medications       aspirin EC 81 MG tablet  Take 1 tablet (81 mg total) by mouth daily.     cholecalciferol 1000 UNITS tablet  Commonly known as:  VITAMIN D  Take 2,000 Units by mouth daily.     clopidogrel 75 MG tablet  Commonly known as:  PLAVIX  Take 1 tablet by mouth daily.     CRESTOR 40 MG tablet  Generic drug:  rosuvastatin  Take 1 tablet  by mouth daily.     enalapril 20 MG tablet  Commonly known as:  VASOTEC  Take 1 tablet by mouth daily.     LANTUS SOLOSTAR 100 UNIT/ML Solostar Pen  Generic drug:  Insulin Glargine  Inject 15-30 Units into the skin 2 (two) times daily. 15 in the morning and 30 units at night     metFORMIN 500 MG tablet  Commonly known as:  GLUCOPHAGE  Take 1 tablet by mouth 2 (two) times daily.     metoprolol succinate 25 MG 24 hr tablet  Commonly known as:  TOPROL-XL  Take 1 tablet (25 mg total) by mouth daily.     niacin 500 MG CR tablet  Commonly known as:  NIASPAN  Take 1 tablet by mouth 2 (two) times daily.     nitroGLYCERIN 0.4 MG SL tablet  Commonly known as:  NITROSTAT  Place 1 tablet (0.4 mg total) under the tongue every 5 (five) minutes as needed for chest pain.     NOVOLOG FLEXPEN 100 UNIT/ML FlexPen  Generic drug:  insulin aspart  Inject 0-15 Units into the skin 3 (three) times daily with meals. Sliding scale     oxybutynin 15 MG 24 hr tablet  Commonly known as:  DITROPAN XL  Take 1 tablet by mouth daily.     potassium chloride SA 20 MEQ tablet  Commonly known as:  K-DUR,KLOR-CON  Take 1 tablet by mouth daily.     propylthiouracil 50 MG tablet  Commonly known as:  PTU  Take 1 tablet by mouth daily.     ranitidine 150 MG tablet  Commonly known as:  ZANTAC  Take 1 tablet by mouth daily.     traZODone 50 MG tablet  Commonly known as:  DESYREL  Take 1 tablet by mouth at bedtime.       Allergies  Allergen Reactions  . Penicillins Swelling   Follow-up Information   Follow up with [facility], MD In 1 week.   Specialty:  Internal Medicine   Contact information:   [address] [place] [address] [phone]       Follow up with Laverda Page, MD In 1 week. (Monday May 11 at 12 noon)    Specialty:  Cardiology   Contact information:   [place] [place] [address] [phone]        The results of significant diagnostics  from this hospitalization (including imaging, microbiology, ancillary and laboratory) are listed below for reference.    Significant Diagnostic Studies: Dg Chest 2 View  01/19/2014   CLINICAL DATA:  Chest pain  EXAM: CHEST  2 VIEW  COMPARISON:  06/24/2010  FINDINGS: Upper normal heart size.  Mediastinal contours and pulmonary vascularity normal.  Lungs clear.  No pleural effusion or pneumothorax.  Probable EKG lead projects over the LEFT apex  laterally.  Scattered endplate spur formation thoracic spine.  IMPRESSION: No acute abnormalities.   Electronically Signed   By: Lavonia Dana M.D.   On: 01/19/2014 14:13   Nm Myocar Multi W/spect W/wall Motion / Ef  01/21/2014   CLINICAL DATA:  Chest pain  EXAM: MYOCARDIAL IMAGING WITH SPECT (REST AND PHARMACOLOGIC-STRESS - 2 DAY PROTOCOL)  GATED LEFT VENTRICULAR WALL MOTION STUDY  LEFT VENTRICULAR EJECTION FRACTION  TECHNIQUE: Standard myocardial SPECT imaging was performed after resting intravenous injection of 30 mCi Tc-58m sestamibi. Subsequently, on a second day, intravenous infusion of Lexiscan was performed under the supervision of the Cardiology staff. At peak effect of the drug, 30 mCi Tc-69m sestamibi was injected intravenously and standard myocardial SPECT imaging was performed. Quantitative gated imaging was also performed to evaluate left ventricular wall motion, and estimate left ventricular ejection fraction.  COMPARISON:  None.  FINDINGS: SPECT: On the stress imaging, there is a large perfusion defect involving the inferior wall extending into the apex, septum, and lateral walls. On rest imaging, there is some reperfusion at the edge of the infarct in the septum.  Wall motion marked septal hypokinesis.  Ejection fraction 38%. End-diastolic volume 63 cc. End systolic volume 39 cc.  IMPRESSION: There is a large scar involving the inferior wall extending into the septum, apex, and lateral walls. There is peri-infarct ischemia in the septum.   Electronically  Signed   By: Maryclare Bean M.D.   On: 01/21/2014 13:48    Microbiology: No results found for this or any previous visit (from the past 240 hour(s)).   Labs: Basic Metabolic Panel:  Recent Labs Lab 01/19/14 1325  NA 138  K 4.3  CL 100  CO2 22  GLUCOSE 131*  BUN 11  CREATININE 0.74  CALCIUM 9.6   Liver Function Tests: No results found for this basename: AST, ALT, ALKPHOS, BILITOT, PROT, ALBUMIN,  in the last 168 hours No results found for this basename: LIPASE, AMYLASE,  in the last 168 hours No results found for this basename: AMMONIA,  in the last 168 hours CBC:  Recent Labs Lab 01/19/14 1325  WBC 7.9  HGB 11.9*  HCT 36.3  MCV 79.6  PLT 236   Cardiac Enzymes:  Recent Labs Lab 01/19/14 2305 01/20/14 0620  TROPONINI <0.30 <0.30   BNP: BNP (last 3 results)  Recent Labs  01/19/14 1325  PROBNP 56.4   CBG:  Recent Labs Lab 01/20/14 2118 01/21/14 01/21/14 0424 01/21/14 0753 01/21/14 1127  GLUCAP 173* 116* 86 106* 228*       Signed:  Serenity Batley A Karlita Lichtman  [facility] 01/21/2014, 3:33 PM

## 2014-04-16 ENCOUNTER — Ambulatory Visit: Payer: Medicare Other | Admitting: Podiatrist

## 2014-05-18 ENCOUNTER — Ambulatory Visit: Payer: Medicare Other | Attending: Cardiology

## 2014-05-18 DIAGNOSIS — R279 Unspecified lack of coordination: Secondary | ICD-10-CM | POA: Diagnosis not present

## 2014-05-18 DIAGNOSIS — R262 Difficulty in walking, not elsewhere classified: Secondary | ICD-10-CM | POA: Diagnosis not present

## 2014-05-18 DIAGNOSIS — I252 Old myocardial infarction: Secondary | ICD-10-CM | POA: Diagnosis not present

## 2014-05-18 DIAGNOSIS — Z8673 Personal history of transient ischemic attack (TIA), and cerebral infarction without residual deficits: Secondary | ICD-10-CM | POA: Diagnosis not present

## 2014-05-18 DIAGNOSIS — R5381 Other malaise: Secondary | ICD-10-CM | POA: Insufficient documentation

## 2014-05-18 DIAGNOSIS — IMO0001 Reserved for inherently not codable concepts without codable children: Secondary | ICD-10-CM | POA: Diagnosis present

## 2014-05-21 ENCOUNTER — Ambulatory Visit: Payer: Medicare Other

## 2014-05-26 ENCOUNTER — Ambulatory Visit: Payer: Medicare Other | Attending: Cardiology

## 2014-05-26 DIAGNOSIS — R262 Difficulty in walking, not elsewhere classified: Secondary | ICD-10-CM | POA: Diagnosis not present

## 2014-05-26 DIAGNOSIS — IMO0001 Reserved for inherently not codable concepts without codable children: Secondary | ICD-10-CM | POA: Insufficient documentation

## 2014-05-26 DIAGNOSIS — R279 Unspecified lack of coordination: Secondary | ICD-10-CM | POA: Diagnosis not present

## 2014-05-26 DIAGNOSIS — Z8673 Personal history of transient ischemic attack (TIA), and cerebral infarction without residual deficits: Secondary | ICD-10-CM | POA: Insufficient documentation

## 2014-05-26 DIAGNOSIS — I252 Old myocardial infarction: Secondary | ICD-10-CM | POA: Insufficient documentation

## 2014-05-26 DIAGNOSIS — R5381 Other malaise: Secondary | ICD-10-CM | POA: Insufficient documentation

## 2014-05-28 ENCOUNTER — Ambulatory Visit: Payer: Medicare Other

## 2014-05-29 ENCOUNTER — Ambulatory Visit: Payer: Medicare Other

## 2014-05-29 DIAGNOSIS — IMO0001 Reserved for inherently not codable concepts without codable children: Secondary | ICD-10-CM | POA: Diagnosis not present

## 2014-06-02 ENCOUNTER — Ambulatory Visit: Payer: Medicare Other | Admitting: Physical Therapy

## 2014-06-02 DIAGNOSIS — IMO0001 Reserved for inherently not codable concepts without codable children: Secondary | ICD-10-CM | POA: Diagnosis not present

## 2014-06-04 ENCOUNTER — Ambulatory Visit: Payer: Medicare Other | Admitting: Physical Therapy

## 2014-06-04 DIAGNOSIS — IMO0001 Reserved for inherently not codable concepts without codable children: Secondary | ICD-10-CM | POA: Diagnosis not present

## 2014-06-09 ENCOUNTER — Ambulatory Visit: Payer: Medicare Other

## 2014-06-09 DIAGNOSIS — IMO0001 Reserved for inherently not codable concepts without codable children: Secondary | ICD-10-CM | POA: Diagnosis not present

## 2014-06-11 ENCOUNTER — Ambulatory Visit: Payer: Medicare Other

## 2014-06-11 DIAGNOSIS — IMO0001 Reserved for inherently not codable concepts without codable children: Secondary | ICD-10-CM | POA: Diagnosis not present

## 2014-06-16 ENCOUNTER — Ambulatory Visit: Payer: Medicare Other

## 2014-06-16 DIAGNOSIS — IMO0001 Reserved for inherently not codable concepts without codable children: Secondary | ICD-10-CM | POA: Diagnosis not present

## 2014-06-19 ENCOUNTER — Ambulatory Visit: Payer: Medicare Other

## 2014-06-19 DIAGNOSIS — IMO0001 Reserved for inherently not codable concepts without codable children: Secondary | ICD-10-CM | POA: Diagnosis not present

## 2014-06-23 ENCOUNTER — Ambulatory Visit: Payer: Medicare Other

## 2014-06-23 DIAGNOSIS — IMO0001 Reserved for inherently not codable concepts without codable children: Secondary | ICD-10-CM | POA: Diagnosis not present

## 2014-06-26 ENCOUNTER — Ambulatory Visit: Payer: Medicare Other | Attending: Cardiology

## 2014-06-26 DIAGNOSIS — R5381 Other malaise: Secondary | ICD-10-CM | POA: Insufficient documentation

## 2014-06-26 DIAGNOSIS — Z5189 Encounter for other specified aftercare: Secondary | ICD-10-CM | POA: Diagnosis present

## 2014-06-26 DIAGNOSIS — R262 Difficulty in walking, not elsewhere classified: Secondary | ICD-10-CM | POA: Insufficient documentation

## 2014-06-26 DIAGNOSIS — I252 Old myocardial infarction: Secondary | ICD-10-CM | POA: Insufficient documentation

## 2014-06-26 DIAGNOSIS — Z8673 Personal history of transient ischemic attack (TIA), and cerebral infarction without residual deficits: Secondary | ICD-10-CM | POA: Insufficient documentation

## 2014-06-26 DIAGNOSIS — R279 Unspecified lack of coordination: Secondary | ICD-10-CM | POA: Insufficient documentation

## 2014-06-30 ENCOUNTER — Ambulatory Visit: Payer: Medicare Other | Admitting: Physical Therapy

## 2014-06-30 DIAGNOSIS — R262 Difficulty in walking, not elsewhere classified: Secondary | ICD-10-CM | POA: Diagnosis not present

## 2014-07-03 ENCOUNTER — Ambulatory Visit: Payer: Medicare Other | Admitting: Physical Therapy

## 2014-07-06 ENCOUNTER — Ambulatory Visit: Payer: Medicare Other

## 2014-07-08 ENCOUNTER — Ambulatory Visit: Payer: Medicare Other

## 2014-07-08 DIAGNOSIS — R262 Difficulty in walking, not elsewhere classified: Secondary | ICD-10-CM | POA: Diagnosis not present

## 2014-07-15 ENCOUNTER — Ambulatory Visit: Payer: Medicare Other | Admitting: Physical Therapy

## 2014-07-15 DIAGNOSIS — R262 Difficulty in walking, not elsewhere classified: Secondary | ICD-10-CM | POA: Diagnosis not present

## 2014-07-17 ENCOUNTER — Ambulatory Visit: Payer: Medicare Other

## 2014-07-17 DIAGNOSIS — R262 Difficulty in walking, not elsewhere classified: Secondary | ICD-10-CM | POA: Diagnosis not present

## 2014-07-22 ENCOUNTER — Ambulatory Visit: Payer: Medicare Other

## 2014-08-04 ENCOUNTER — Telehealth: Payer: Self-pay | Admitting: *Deleted

## 2014-08-04 ENCOUNTER — Ambulatory Visit: Payer: Medicare Other | Attending: Cardiology

## 2014-08-04 VITALS — BP 142/92 | HR 80

## 2014-08-04 DIAGNOSIS — R279 Unspecified lack of coordination: Secondary | ICD-10-CM | POA: Diagnosis not present

## 2014-08-04 DIAGNOSIS — Z8673 Personal history of transient ischemic attack (TIA), and cerebral infarction without residual deficits: Secondary | ICD-10-CM | POA: Insufficient documentation

## 2014-08-04 DIAGNOSIS — I252 Old myocardial infarction: Secondary | ICD-10-CM | POA: Diagnosis not present

## 2014-08-04 DIAGNOSIS — R5381 Other malaise: Secondary | ICD-10-CM | POA: Diagnosis not present

## 2014-08-04 DIAGNOSIS — R262 Difficulty in walking, not elsewhere classified: Secondary | ICD-10-CM | POA: Insufficient documentation

## 2014-08-04 DIAGNOSIS — M25569 Pain in unspecified knee: Secondary | ICD-10-CM

## 2014-08-04 DIAGNOSIS — Z5189 Encounter for other specified aftercare: Secondary | ICD-10-CM | POA: Diagnosis present

## 2014-08-04 NOTE — Therapy (Signed)
Physical Therapy Evaluation  Patient Details  Name: Angelica Beck MRN: [id number] Date of Birth: 09/06/1951  Encounter Date: 08/04/2014      PT End of Session - 08/04/14 1709    Visit Number 1   Number of Visits 10   Date for PT Re-Evaluation 09/07/14   PT Start Time 1330   PT Stop Time 1415   PT Time Calculation (min) 45 min   Activity Tolerance Patient tolerated treatment well      Past Medical History  Diagnosis Date  . Hypertension   . Stroke   . Arthritis   . Diabetes mellitus, type 2   . Hyperlipidemia   . Hyperthyroidism   . Obesity hypoventilation syndrome 06/20/2013    Past Surgical History  Procedure Laterality Date  . Ventral hernia repair      BP 142/92 mmHg  Pulse 80  Visit Diagnosis:  Difficulty in walking - Plan: PT plan of care cert/re-cert  Pain in joint, lower leg, unspecified laterality - Plan: PT plan of care cert/re-cert      Subjective Assessment - 08/04/14 1346    Symptoms Pt c/o of Bil LE pain (ankle pain with standing and walking) and LE pain when lying supine and trying to extend legs straight. Now: 0/10, Worst: 8/10.  Increased difficulty with walking. Worsened leg pain since CVA in 2011.    Pertinent History CVA 4 years ago. Pt had cortisone shot into L knee that has resolved knee pain.    Limitations Standing;Walking   How long can you walk comfortably? 30 feet with RW   Patient Stated Goals Less pain with walking. Improve walking for boat cruise in January.    Currently in Pain? Yes   Pain Score 0-No pain   Pain Location Ankle   Pain Orientation Right;Left   Pain Descriptors / Indicators Aching   Pain Onset More than a month ago   Aggravating Factors  Ascending/ descending steps into home.    Pain Relieving Factors Rest and elevate LEs.    Effect of Pain on Daily Activities Difficulty with walking.           [facility] PT Assessment - 08/04/14 0001    Assessment   Medical Diagnosis CVA- difficulty with walking and leg pain    Onset Date 05/04/14   Prior Therapy Just discharged from neuro PT    Precautions   Precautions None   [place] Private residence   Living Arrangements Children   Available Help at Discharge Family   Type of [place] Access Other (comment)   Home Layout Two level   Alternate Level Stairs-Rails Left   Prior Function   Level of Independence Needs assistance with ADLs   Observation/Other Assessments   Focus on Therapeutic Outcomes (FOTO)  70% limited  Predicted 50%   PROM   Overall PROM  Deficits   Overall PROM Comments hip extension -40 from neutral Bil  severe pain with attempts to straighten legs from hooklying   Strength   Right Hip Flexion 3-/5  within available range (40-90 degrees)    Left Hip Flexion 3-/5  within available range (40-90 degrees)    Right Knee Flexion 4/5   Right Knee Extension 4/5   Left Knee Flexion 3+/5   Left Knee Extension 4/5   Right Ankle Dorsiflexion 4/5   Right Ankle Inversion 4/5   Right Ankle Eversion 4/5   Left Ankle Dorsiflexion 4/5   Left Ankle Inversion 4/5  Left Ankle Eversion 3+/5   Flexibility   Soft Tissue Assessment /Muscle Lenght yes   Quadriceps HIp flexors: hip extension -40 bil   Ambulation/Gait   Ambulation/Gait Yes   Ambulation/Gait Assistance 5: Supervision   Ambulation/Gait Assistance Details foward flexed posture, use of RW   Ambulation Distance (Feet) 30 Feet   Assistive device Rolling walker   Gait Pattern Decreased dorsiflexion - right;Decreased dorsiflexion - left;Shuffle;Antalgic;Poor foot clearance - left;Poor foot clearance - right   Stairs --  performs at home to reach bedroom on second level   Standardized Balance Assessment   Standardized Balance Assessment Timed Up and Go Test   Timed Up and Go Test   TUG Normal TUG   Normal TUG (seconds) 42   TUG Comments with RW   Functional Gait  Assessment   Gait assessed  No          [facility] Adult PT Treatment/Exercise -  08/04/14 0001    Bed Mobility   Bed Mobility Supine to Sit   Supine to Sit 3: Mod assist   Supine to Sit Details (indicate cue type and reason) to lift LE aand trunk against gravity   Transfers   Transfers Sit to Stand;Stand to Sit   Sit to Stand 5: Supervision   Stand to Sit 5: Supervision   Posture/Postural Control   Posture/Postural Control Postural limitations   Postural Limitations Rounded Shoulders;Forward head;Increased lumbar lordosis;Anterior pelvic tilt;Flexed trunk          PT Education - 08/04/14 1708    Education provided Yes   Education Details PT POC, transfer supine to sit by rolling into sidelying and pushing up to sit, instructed pt to perform HEP issued at neuro ( pt was recently discharged from neuro).    Person(s) Educated Patient;Caregiver(s)   Methods Explanation;Demonstration;Tactile cues;Verbal cues;Handout   Comprehension Verbalized understanding;Returned demonstration;Verbal cues required;Tactile cues required          PT Short Term Goals - 08/04/14 1720    PT SHORT TERM GOAL #1   Title Est initial HEP    Time 2   Period Weeks   Status New   PT SHORT TERM GOAL #2   Title Improve hip extension PROM from -40 to -30 bilaterally   Time 3   Period Weeks   Status New          PT Long Term Goals - 08/04/14 1722    PT LONG TERM GOAL #1   Title TUG improve from 42 secs with RW to 30 secs with RW for improved mobility and decreased risk for falls.    Time 5   Period Weeks   Status New   PT LONG TERM GOAL #2   Title FOTO will improve from 70% disability on intake to 50% disability at discharge.   Time 5   Period Weeks   Status New   PT LONG TERM GOAL #3   Title Pt will be independent and compliant with daily HEP.    Time 5   Period Weeks   Status New   PT LONG TERM GOAL #4   Title Hip extension will improve to -20 degrees in order for pt to lay supine without increased pain into B LE   Time 5   Period Weeks   Status New   PT LONG TERM  GOAL #5   Title Bil LE pain will improve from 8/10 to 4/10 at worst with functional activities.    Time 5   Period Weeks  Status New          Plan - Aug 20, 2014 1711    Clinical Impression Statement Pt presents with Bil LE pain, weakness, limited flexibility, difficulty in walking, R ankle edema. Pt had CVA in 2011 with exacerbation of symptoms recently. Specifically, pt c/o of ankle pain and swelling in standing and walking, and radiating pain from hip to foot lying supine with legs extended out on mat. Pt would benefit from outpt PT for 2 times a week for 5 weeks to progress toward max level of function and establish a HEP.     Pt will benefit from skilled therapeutic intervention in order to improve on the following deficits Abnormal gait;Decreased activity tolerance;Decreased mobility;Decreased strength;Pain;Impaired flexibility;Difficulty walking;Decreased range of motion;Increased edema;Obesity   Rehab Potential Fair   Clinical Impairments Affecting Rehab Potential obesity, compliance with HEP, pt completed PT at neuro clinic last month.    PT Frequency 2x / week   PT Duration Other (comment)  5 weeks   PT Treatment/Interventions ADLs/Self Care Home Management;Therapeutic activities;Therapeutic exercise;Gait training;Moist Heat;Functional mobility training;Neuromuscular re-education;Balance training;Manual techniques;Patient/family education   PT Next Visit Plan LE stretching ( hip flexor) . Est HEP ( .seated ther ex). Gait training with RW.    Consulted and Agree with Plan of Care Patient;Family member/caregiver          G-Codes - 2014-08-20 1726    Functional Limitation Mobility: Walking and moving around   Mobility: Walking and Moving Around Current Status [phone]) At least 60 percent but less than 80 percent impaired, limited or restricted   Mobility: Walking and Moving Around Goal Status [phone]) At least 40 percent but less than 60 percent impaired, limited or restricted       Problem List Patient Active Problem List   Diagnosis Date Noted  . Chest pain 01/19/2014  . Obesity hypoventilation syndrome 06/20/2013  . HYPERTHYROIDISM 06/28/2009  . DIAB W/UNS COMP TYPE II/UNS NOT STATED UNCNTRL 06/28/2009  . HYPERLIPIDEMIA-MIXED 06/28/2009  . OVERWEIGHT/OBESITY 06/28/2009  . HYPERTENSION, BENIGN 06/28/2009  . CAD, NATIVE VESSEL 06/28/2009  . DYSPNEA 06/28/2009                                              Dollene Cleveland 2014/08/20, 5:29 PM

## 2014-08-04 NOTE — Telephone Encounter (Signed)
Gave an appt for 08/18/14 and 08/24/14 due to the caregiver request. i explain that the pt needs to come x2 a week .The caregiver chose not to add on anymore appts at this time...td

## 2014-08-18 ENCOUNTER — Ambulatory Visit: Payer: Medicare Other | Admitting: Rehabilitation

## 2014-08-18 DIAGNOSIS — R262 Difficulty in walking, not elsewhere classified: Secondary | ICD-10-CM

## 2014-08-18 DIAGNOSIS — M25569 Pain in unspecified knee: Secondary | ICD-10-CM

## 2014-08-18 NOTE — Therapy (Signed)
Physical Therapy Treatment  Patient Details  Name: Angelica Beck MRN: [id number] Date of Birth: 1951-04-10  Encounter Date: 08/18/2014      PT End of Session - 08/18/14 1448    Visit Number 2   Number of Visits 10   Date for PT Re-Evaluation 09/07/14   PT Start Time 0130   PT Stop Time 0220   PT Time Calculation (min) 50 min      Past Medical History  Diagnosis Date  . Hypertension   . Stroke   . Arthritis   . Diabetes mellitus, type 2   . Hyperlipidemia   . Hyperthyroidism   . Obesity hypoventilation syndrome 06/20/2013    Past Surgical History  Procedure Laterality Date  . Ventral hernia repair      There were no vitals taken for this visit.  Visit Diagnosis:  Difficulty in walking  Pain in joint, lower leg, unspecified laterality      Subjective Assessment - 08/18/14 1332    Symptoms Pt reports no pain today   Currently in Pain? No/denies            [facility] Adult PT Treatment/Exercise - 08/18/14 0135    Knee/Hip Exercises: Stretches   Hip Flexor Stretch 3 reps;30 seconds  with assistance from PTA to lower right leg to table height    Hip Flexor Stretch Limitations --  increased anterior thigh pain with stretching   Knee/Hip Exercises: Seated   Long Arc Quad Strengthening;10 reps   Long Arc Quad Weight --  Red band   Heel Slides Strengthening;10 reps   Heel Slides Limitations --  red band hamstring curls   Other Seated Knee Exercises --  Clam with red band x10   Other Seated Knee Exercises --  March x10 each leg, ball squeeze x10   Manual Therapy   Manual Therapy Massage   Massage --  soft tissuse work right anterior thigh, very tender          PT Education - 08/18/14 1448    Education provided Yes   Person(s) Educated Patient   Methods Demonstration;Handout;Explanation   Comprehension Verbalized understanding              Plan - 08/18/14 1448    Clinical Impression Statement able to initiate seated LE strengthening and hip  flexor stretch for HEP without increased pain   PT Next Visit Plan Review HEP, continue manual to thighs as needed, gait        Problem List Patient Active Problem List   Diagnosis Date Noted  . Chest pain 01/19/2014  . Obesity hypoventilation syndrome 06/20/2013  . HYPERTHYROIDISM 06/28/2009  . DIAB W/UNS COMP TYPE II/UNS NOT STATED UNCNTRL 06/28/2009  . HYPERLIPIDEMIA-MIXED 06/28/2009  . OVERWEIGHT/OBESITY 06/28/2009  . HYPERTENSION, BENIGN 06/28/2009  . CAD, NATIVE VESSEL 06/28/2009  . DYSPNEA 06/28/2009      Dorene Ar, PTA 08/18/2014, 2:52 PM

## 2014-08-18 NOTE — Patient Instructions (Addendum)
Knee Extension: Resisted (Sitting)   With band looped around right ankle and under other foot, straighten leg with ankle loop. Keep other leg bent to increase resistance. Repeat ___10_ times per set. Do _2___ sets per session. Do _2___ sessions per day.  http://orth.exer.us/690   CKnee Flexion: Resisted (Sitting)   Sit with band under left foot and looped around ankle of supported leg. Pull unsupported leg back. Repeat __10__ times per set. Do 2____ sets per session. Do _2___ sessions per day.  http://orth.exer.us/695   Copyright  VHI. All rights reserved.     FLEXION: Sitting (Active)  Sit, both feet flat. Lift right knee toward ceiling. Repeat x10, on each leg Complete _2__ sets of __10_ repetitions. Perform _2__ sessions per day.  Abduction / Adduction  Feet hip width apart, spread thighs out, then bring thighs together. THERABAND AROUND THIGHS Repeat __10_ times each direction. Do _2__ sessions per day. Do with __red____ colored band Strengthening: Hip Adduction - Isometric  CAN DO SITTING With ball or folded pillow between knees, squeeze knees together. Hold _5___ seconds. Repeat __10__ times per set. Do _2___ sets per session. Do _2__ sessions per day.  http://orth.exer.us/613   Copyright  VHI. All rights reserved.  round thighs. Note: If possible, place feet on floor.  Copyright  VHI. All rights reserved.   Dorsiflexion (Eccentric), (Resistance Band)   Pull foot up against resistance band. Slowly release for 3-5 seconds. Use _____red___ resistance band. _10__ reps per set, _2__ sets per day, _7__ days per week.  Copyright  VHI. All rights reserved.

## 2014-08-24 ENCOUNTER — Ambulatory Visit: Payer: Medicare Other | Admitting: Rehabilitation

## 2014-08-31 ENCOUNTER — Ambulatory Visit: Payer: Medicare Other | Attending: Cardiology | Admitting: Rehabilitation

## 2014-08-31 DIAGNOSIS — R5381 Other malaise: Secondary | ICD-10-CM | POA: Diagnosis not present

## 2014-08-31 DIAGNOSIS — Z5189 Encounter for other specified aftercare: Secondary | ICD-10-CM | POA: Diagnosis present

## 2014-08-31 DIAGNOSIS — R262 Difficulty in walking, not elsewhere classified: Secondary | ICD-10-CM | POA: Diagnosis not present

## 2014-08-31 DIAGNOSIS — Z8673 Personal history of transient ischemic attack (TIA), and cerebral infarction without residual deficits: Secondary | ICD-10-CM | POA: Diagnosis not present

## 2014-08-31 DIAGNOSIS — I252 Old myocardial infarction: Secondary | ICD-10-CM | POA: Diagnosis not present

## 2014-08-31 DIAGNOSIS — R279 Unspecified lack of coordination: Secondary | ICD-10-CM | POA: Diagnosis not present

## 2014-08-31 DIAGNOSIS — M25569 Pain in unspecified knee: Secondary | ICD-10-CM

## 2014-08-31 NOTE — Therapy (Signed)
Outpatient Rehabilitation [facility] [address] [place], [place], [address] Phone: [phone]   Fax:  [phone]  Physical Therapy Treatment  Patient Details  Name: Angelica Beck MRN: [id number] Date of Birth: 09-06-1951  Encounter Date: 08/31/2014      PT End of Session - 08/31/14 1422    Visit Number 3   Number of Visits 10   Date for PT Re-Evaluation 09/07/14   PT Start Time 0140   PT Stop Time 0235   PT Time Calculation (min) 55 min      Past Medical History  Diagnosis Date  . Hypertension   . Stroke   . Arthritis   . Diabetes mellitus, type 2   . Hyperlipidemia   . Hyperthyroidism   . Obesity hypoventilation syndrome 06/20/2013    Past Surgical History  Procedure Laterality Date  . Ventral hernia repair      There were no vitals taken for this visit.  Visit Diagnosis:  Difficulty in walking  Pain in joint, lower leg, unspecified laterality      Subjective Assessment - 08/31/14 1348    Symptoms pt reports she only has pain with going up and down the steps however today it has continued to hurt while walking to get to her PT appointment today and into gym. Pain is in Right thigh down to right ankle  and rated at a 9/10. Pt's caregiver states she does not believe pt performs HEP, she has seen her do it once since last appointment. She reports no change in pts function.    Pertinent History CVA 4 years ago. Pt had cortisone shot into L knee that has resolved knee pain.    Limitations Standing;Walking   Patient Stated Goals Less pain with walking. Improve walking for boat cruise in January.    Aggravating Factors  ascending/descending stairs   Pain Relieving Factors rest   Multiple Pain Sites No          [facility] PT Assessment - 08/31/14 1353    Strength   Right Hip Flexion 3-/5   Left Hip Flexion 3-/5   Left Knee Flexion 4/5   Left Knee Extension 4/5  holds at first and then gives away          [facility] Adult PT Treatment/Exercise -  08/31/14 1353    Ambulation/Gait   Ambulation/Gait Yes   Ambulation/Gait Assistance 5: Supervision   Ambulation/Gait Assistance Details forward flexed   Ambulation Distance (Feet) 62 Feet   Assistive device Rolling walker   Gait Pattern Decreased dorsiflexion - right;Decreased dorsiflexion - left;Shuffle;Antalgic;Poor foot clearance - left;Poor foot clearance - right   Lumbar Exercises: Supine   Ab Set 10 reps   Clam 10 reps   Bent Knee Raise 10 reps   Other Supine Lumbar Exercises ball squeeze with abset x12   Knee/Hip Exercises: Seated   Long Arc Quad Strengthening;10 reps   Long Arc Quad Weight --  Red band   Heel Slides Limitations --  red band hamstring curls   Other Seated Knee Exercises --  Clam with red band x20   Other Seated Knee Exercises --  March x10 each leg, ball squeeze x10   Modalities   Modalities Moist Heat   Moist Heat Therapy   Number Minutes Moist Heat 15 Minutes   Moist Heat Location --  right thigh          PT Education - 08/31/14 1422    Education provided Yes   Person(s) Educated Patient  Methods Explanation   Comprehension Verbalized understanding          PT Short Term Goals - 08/31/14 1431    PT SHORT TERM GOAL #1   Title Est initial HEP    Time 2   Period Weeks   Status Achieved   PT SHORT TERM GOAL #2   Title Improve hip extension PROM from -40 to -30 bilaterally   Time 3   Period Weeks   Status On-going          PT Long Term Goals - 08/31/14 1433    PT LONG TERM GOAL #1   Title TUG improve from 42 secs with RW to 30 secs with RW for improved mobility and decreased risk for falls.    Time 5   Period Weeks   Status On-going   PT LONG TERM GOAL #2   Title FOTO will improve from 70% disability on intake to 50% disability at discharge.   Time 5   Period Weeks   Status On-going   PT LONG TERM GOAL #3   Title Pt will be independent and compliant with daily HEP.    Time 5   Status On-going   PT LONG TERM GOAL #4    Title Hip extension will improve to -20 degrees in order for pt to lay supine without increased pain into B LE   Time 5   Period Weeks   Status On-going   PT LONG TERM GOAL #5   Title Bil LE pain will improve from 8/10 to 4/10 at worst with functional activities.    Time 5   Period Weeks   Status On-going          Plan - 08/31/14 1430    Clinical Impression Statement Pt able to perform supine march exercises without increased pain, added to HEP , pt late and requires increased time   PT Next Visit Plan measure hip extension, attempt more hip ext stretching, manual, heat as needed. Nustep?  review supine HEP       Problem List Patient Active Problem List   Diagnosis Date Noted  . Chest pain 01/19/2014  . Obesity hypoventilation syndrome 06/20/2013  . HYPERTHYROIDISM 06/28/2009  . DIAB W/UNS COMP TYPE II/UNS NOT STATED UNCNTRL 06/28/2009  . HYPERLIPIDEMIA-MIXED 06/28/2009  . OVERWEIGHT/OBESITY 06/28/2009  . HYPERTENSION, BENIGN 06/28/2009  . CAD, NATIVE VESSEL 06/28/2009  . DYSPNEA 06/28/2009    Dorene Ar, PTA 08/31/2014, 3:23 PM

## 2014-08-31 NOTE — Patient Instructions (Signed)
   PELVIC TILT  Lie on back, legs bent. Exhale, tilting top of pelvis back, pubic bone up, to flatten lower back. Inhale, rolling pelvis opposite way, top forward, pubic bone down, arch in back. Repeat __10__ times. Do __2__ sessions per day. Copyright  VHI. All rights reserved.    Isometric Hold With Pelvic Floor (Hook-Lying)  Lie with hips and knees bent. Slowly inhale, and then exhale. Pull navel toward spine and tighten pelvic floor. Hold for __10_ seconds. Continue to breathe in and out during hold. Rest for _10__ seconds. Repeat __10_ times. Do __2-3_ times a day.   Knee Fold  Lie on back, legs bent, arms by sides. Exhale, lifting knee to chest. Inhale, returning. Keep abdominals flat, navel to spine. Repeat __10__ times, alternating legs. Do __2__ sessions per day.  Knee Drop  Keep pelvis stable. Without rotating hips, slowly drop knee to side, pause, return to center, bring knee across midline toward opposite hip. Feel obliques engaging. Repeat for ___10_ times each leg.   Copyright  VHI. All rights reserved.       http://ss.exer.us/16   Copyright  VHI. All rights reserved.

## 2014-09-02 ENCOUNTER — Ambulatory Visit: Payer: Medicare Other | Admitting: Rehabilitation

## 2014-09-02 DIAGNOSIS — R262 Difficulty in walking, not elsewhere classified: Secondary | ICD-10-CM | POA: Diagnosis not present

## 2014-09-02 DIAGNOSIS — M25569 Pain in unspecified knee: Secondary | ICD-10-CM

## 2014-09-02 NOTE — Therapy (Signed)
[facility] [address] [place], [place], [address] Phone: [phone]   Fax:  [phone]  Physical Therapy Treatment  Patient Details  Name: Angelica Beck MRN: [id number] Date of Birth: 27-May-1951  Encounter Date: 09/02/2014      PT End of Session - 09/02/14 1411    Visit Number 4   Number of Visits 10   Date for PT Re-Evaluation 09/07/14   PT Start Time 0130   PT Stop Time 0217   PT Time Calculation (min) 47 min      Past Medical History  Diagnosis Date  . Hypertension   . Stroke   . Arthritis   . Diabetes mellitus, type 2   . Hyperlipidemia   . Hyperthyroidism   . Obesity hypoventilation syndrome 06/20/2013    Past Surgical History  Procedure Laterality Date  . Ventral hernia repair      There were no vitals taken for this visit.  Visit Diagnosis:  Difficulty in walking  Pain in joint, lower leg, unspecified laterality      Subjective Assessment - 09/02/14 1337    Pain Score 3    Pain Location --  bilateral thighs   Pain Descriptors / Indicators --  it just hurts          [facility] PT Assessment - 09/02/14 1356    Strength   Right Hip ABduction 3-/5   Left Hip ABduction 2+/5          [facility] Adult PT Treatment/Exercise - 09/02/14 1339    Lumbar Exercises: Aerobic   Stationary Bike Nustep Level 3 x  6 minutes   Lumbar Exercises: Supine   Ab Set --   Clam 20 reps  green band and ab set   Bent Knee Raise 20 reps  2 sets with green band around thighs    Knee/Hip Exercises: Seated   Long Arc Quad 20 reps   Long Arc Quad Weight 4 lbs.   Other Seated Knee Exercises h/s curls with red band x 20 each   Other Seated Knee Exercises March 4# 10 x 2 each   Knee/Hip Exercises: Supine   Bridges 10 reps;2 sets   Knee/Hip Exercises: Sidelying   Hip ABduction Right;10 reps  left unable   Clams 3 sets of 10 bilateral with 2 # for last set   Other Sidelying Knee Exercises Hip IR in clam position unable left, min  ROM right x 10   Ankle Exercises: Seated   Heel Raises 20 reps   Toe Raise 20 reps                Plan - 09/02/14 1336    Clinical Impression Statement Pt reports less pain with ascending/descending stairs in home today, "legs have been bothering me just a little today"    PT Next Visit Plan measure hip extension, attempt more hip ext stretching, Continue Nustep and general strenghtening, manual and heat as needed          Problem List Patient Active Problem List   Diagnosis Date Noted  . Chest pain 01/19/2014  . Obesity hypoventilation syndrome 06/20/2013  . HYPERTHYROIDISM 06/28/2009  . DIAB W/UNS COMP TYPE II/UNS NOT STATED UNCNTRL 06/28/2009  . HYPERLIPIDEMIA-MIXED 06/28/2009  . OVERWEIGHT/OBESITY 06/28/2009  . HYPERTENSION, BENIGN 06/28/2009  . CAD, NATIVE VESSEL 06/28/2009  . DYSPNEA 06/28/2009    Dorene Ar, PTA 09/02/2014, 2:13 PM

## 2014-09-03 ENCOUNTER — Encounter: Payer: Medicare Other | Admitting: Rehabilitation

## 2014-09-08 ENCOUNTER — Ambulatory Visit: Payer: Medicare Other

## 2014-09-08 VITALS — BP 132/96 | HR 120

## 2014-09-08 DIAGNOSIS — R262 Difficulty in walking, not elsewhere classified: Secondary | ICD-10-CM

## 2014-09-08 NOTE — Therapy (Signed)
Outpatient Rehabilitation [facility] [address] [place], [place], [address] Phone: [phone]   Fax:  [phone]  Physical Therapy Treatment  Patient Details  Name: Angelica Beck MRN: [id number] Date of Birth: 05-15-1951  Encounter Date: 09/08/2014      PT End of Session - 09/08/14 1800    Visit Number --  non-billable visit   Number of Visits 10   Date for PT Re-Evaluation 09/07/14   PT Start Time 1330   PT Stop Time 1410   PT Time Calculation (min) 40 min   Activity Tolerance --  no tx performed due to vitals       Past Medical History  Diagnosis Date  . Hypertension   . Stroke   . Arthritis   . Diabetes mellitus, type 2   . Hyperlipidemia   . Hyperthyroidism   . Obesity hypoventilation syndrome 06/20/2013    Past Surgical History  Procedure Laterality Date  . Ventral hernia repair      BP 132/96 mmHg  Pulse 120  Visit Diagnosis:  Difficulty in walking      Subjective Assessment - 09/08/14 1750    Symptoms Pt reports she is going out of town later in the week to attend a wake and funeral in [place]; a family member passed away.               PT Education - 09/08/14 1751    Education provided Yes   Education Details Notified CG and pt of elevated BP and HR, that PT would contact MD to notify, and if pt presents with any distress or acute symptoms: SOB, chest pain, headache, signs/sx of stroke, to go to ED. Instructed pt to schedule a Follow up and re-eval when returning from [place] .               Plan - 09/08/14 1801    Clinical Impression Statement Pt arrived to tx today for re-evaluation planned. PT took pt's vitals before beginning tx and HR was 120 bpm and L arm BP was 162/104. Held tx today due to vitals and called MD, Dr Delia Chimes, to notify of vitals. PT spoke with [place] who said she would notify MD. Pt is going out of town for a funeral, but will have re-eval upon return to determine continuation of services.     Pt  will benefit from skilled therapeutic intervention in order to improve on the following deficits Abnormal gait;Decreased activity tolerance;Decreased mobility;Decreased strength;Pain;Impaired flexibility;Difficulty walking;Decreased range of motion;Increased edema;Obesity   Clinical Impairments Affecting Rehab Potential obesity, compliance with HEP, pt completed PT at neuro clinic last month.    PT Frequency 2x / week   PT Next Visit Plan Re- eval   Consulted and Agree with Plan of Care Patient;Family member/caregiver                               Problem List Patient Active Problem List   Diagnosis Date Noted  . Chest pain 01/19/2014  . Obesity hypoventilation syndrome 06/20/2013  . HYPERTHYROIDISM 06/28/2009  . DIAB W/UNS COMP TYPE II/UNS NOT STATED UNCNTRL 06/28/2009  . HYPERLIPIDEMIA-MIXED 06/28/2009  . OVERWEIGHT/OBESITY 06/28/2009  . HYPERTENSION, BENIGN 06/28/2009  . CAD, NATIVE VESSEL 06/28/2009  . DYSPNEA 06/28/2009    Angelica Beck, PT, DPT 09/08/2014 6:10 PM Phone: [phone] Fax: [phone]

## 2014-09-15 ENCOUNTER — Ambulatory Visit: Payer: Medicare Other | Admitting: Physical Therapy

## 2014-09-15 VITALS — BP 118/92

## 2014-09-15 DIAGNOSIS — M25569 Pain in unspecified knee: Secondary | ICD-10-CM

## 2014-09-15 DIAGNOSIS — R262 Difficulty in walking, not elsewhere classified: Secondary | ICD-10-CM | POA: Diagnosis not present

## 2014-09-15 NOTE — Therapy (Signed)
[place], [place], [address] Phone: [phone]   Fax:  [phone]  Physical Therapy Treatment and Re-certification  Patient Details  Name: Angelica Beck MRN: [id number] Date of Birth: 25-Apr-1951  Encounter Date: 09/15/2014    Past Medical History  Diagnosis Date  . Hypertension   . Stroke   . Arthritis   . Diabetes mellitus, type 2   . Hyperlipidemia   . Hyperthyroidism   . Obesity hypoventilation syndrome 06/20/2013    Past Surgical History  Procedure Laterality Date  . Ventral hernia repair      BP 118/92 mmHg  Visit Diagnosis:  Pain in joint, lower leg, unspecified laterality - Plan: PT plan of care cert/re-cert  Difficulty in walking - Plan: PT plan of care cert/re-cert      Subjective Assessment - 09/15/14 1338    Symptoms Reports she fell in [place], but did not have any lasting injury.  Lt. ankle pain today. DId not see MD yet re: BP   Pertinent History CVA 4 years ago. Pt had cortisone shot into L knee that has resolved knee pain.    Limitations Standing;Walking   How long can you sit comfortably? No pain incr   How long can you stand comfortably? "for awhile"   How long can you walk comfortably? <10 feet today during PT   Patient Stated Goals Less pain with walking. Improve walking for boat cruise in April   Currently in Pain? Yes   Pain Score 5    Pain Location Ankle   Pain Orientation Left;Anterior   Pain Descriptors / Indicators Aching   Pain Type Chronic pain   Pain Onset More than a month ago   Effect of Pain on Daily Activities Difficulty walking   Multiple Pain Sites No          [facility] PT Assessment - 09/15/14 1354    Cognition   Overall Cognitive Status Within Functional Limits for tasks assessed   Memory Impaired   Problem Solving Impaired   Strength   Right Hip ABduction 2+/5   Left Hip Flexion 3-/5   Left Hip ABduction 2+/5   Right Knee Flexion 5/5   Right  Knee Extension --  4+/5   Left Knee Flexion 4/5   Left Knee Extension 3+/5   Right Ankle Dorsiflexion 5/5   Left Ankle Dorsiflexion 4/5   Ambulation/Gait   Ambulation/Gait Yes   Ambulation/Gait Assistance 5: Supervision   Ambulation Distance (Feet) --  50   Assistive device Rolling walker   Gait Pattern Decreased step length - right;Decreased step length - left;Decreased hip/knee flexion - right;Decreased hip/knee flexion - left                  [facility] Adult PT Treatment/Exercise - 09/15/14 1354    Bed Mobility   Bed Mobility Sit to Supine   Supine to Sit 4: Min assist   Supine to Sit Details (indicate cue type and reason) --  for 1 leg   Sit to Supine 4: Min assist   Sit to Supine - Details (indicate cue type and reason) --  for leg   Lumbar Exercises: Aerobic   Stationary Bike Nustep Level 3 x  6 minutes   Lumbar Exercises: Supine   Clam 20 reps  green band and ab set   Bent Knee Raise 20 reps  2 sets with green band around thighs    Other Supine Lumbar Exercises lower trunk rotation x10  Knee/Hip Exercises: Seated   Long Arc Quad 20 reps   Long Arc Quad Weight 4 lbs.   Other Seated Knee Exercises h/s curls with red band x 20 each   Other Seated Knee Exercises March 4# 10 x 2 each   Knee/Hip Exercises: Supine   Bridges 10 reps;2 sets   Knee/Hip Exercises: Sidelying   Hip ABduction Right;10 reps  left unable   Clams 3 sets of 10 bilateral with 2 # for last set   Other Sidelying Knee Exercises Hip IR in clam position unable left, min ROM right x 10   Moist Heat Therapy   Number Minutes Moist Heat 15 Minutes   Moist Heat Location --  Lt. ant thigh   Electrical Stimulation   Electrical Stimulation Location --  Rt. thigh   Electrical Stimulation Parameters to tolerance   Electrical Stimulation Goals Pain   Bed Mobility   Supine to Sit Details Tactile cues for initiation   Sit to Supine - Details Tactile cues for initiation   Ankle Exercises: Seated    Heel Raises 20 reps   Toe Raise 20 reps                  PT Short Term Goals - 09-16-2014 1459    PT SHORT TERM GOAL #1   Title Est initial HEP    Status Achieved   PT SHORT TERM GOAL #2   Title Improve hip extension PROM from -40 to -30 bilaterally   Status On-going           PT Long Term Goals - 16-Sep-2014 1459    PT LONG TERM GOAL #1   Title TUG improve from 42 secs with RW to 30 secs with RW for improved mobility and decreased risk for falls.    Status Unable to assess   PT LONG TERM GOAL #2   Title FOTO will improve from 70% disability on intake to 50% disability at discharge.   PT LONG TERM GOAL #3   Title Pt will be independent and compliant with daily HEP.    Status On-going   PT LONG TERM GOAL #4   Title Hip extension will improve to -20 degrees in order for pt to lay supine without increased pain into B LE   Status On-going   PT LONG TERM GOAL #5   Title Bil LE pain will improve from 8/10 to 4/10 at worst with functional activities.                Plan - 09-16-2014 1458    Clinical Impression Statement Patient will continue to benefit from functional mobility training to strengthen and encourage gentle activity as tolerated..  She still complains of           G-Codes - 2014/09/16 1639    Functional Limitation Mobility: Walking and moving around   Mobility: Walking and Moving Around Current Status [phone]) At least 60 percent but less than 80 percent impaired, limited or restricted   Mobility: Walking and Moving Around Goal Status [phone]) At least 40 percent but less than 60 percent impaired, limited or restricted      Problem List Patient Active Problem List   Diagnosis Date Noted  . Chest pain 01/19/2014  . Obesity hypoventilation syndrome 06/20/2013  . HYPERTHYROIDISM 06/28/2009  . DIAB W/UNS COMP TYPE II/UNS NOT STATED UNCNTRL 06/28/2009  . HYPERLIPIDEMIA-MIXED 06/28/2009  . OVERWEIGHT/OBESITY 06/28/2009  . HYPERTENSION, BENIGN 06/28/2009   . CAD, NATIVE VESSEL 06/28/2009  .  DYSPNEA 06/28/2009    Angelica Beck 09/15/2014, 4:43 PM  [facility] [address] [place], [place], [address] Phone: [phone]   Fax:  [phone]  Raeford Razor, PT 09/15/2014 4:44 PM Phone: [phone] Fax: [phone]

## 2014-09-22 ENCOUNTER — Ambulatory Visit: Payer: Medicare Other | Admitting: Physical Therapy

## 2014-09-22 DIAGNOSIS — R262 Difficulty in walking, not elsewhere classified: Secondary | ICD-10-CM | POA: Diagnosis not present

## 2014-09-22 DIAGNOSIS — M25569 Pain in unspecified knee: Secondary | ICD-10-CM

## 2014-09-22 NOTE — Therapy (Signed)
[place], [place], [address] Phone: [phone]   Fax:  [phone]  Physical Therapy Treatment  Patient Details  Name: Angelica Beck MRN: [id number] Date of Birth: 1951/04/21  Encounter Date: 09/22/2014      PT End of Session - 09/22/14 1537    Visit Number 7   Number of Visits 20   Date for PT Re-Evaluation 10/20/14   PT Start Time 1336   PT Stop Time 1425   PT Time Calculation (min) 49 min   Activity Tolerance Other (comment)  slow mobility secondary to hemiparesis   Behavior During Therapy Flat affect      Past Medical History  Diagnosis Date  . Hypertension   . Stroke   . Arthritis   . Diabetes mellitus, type 2   . Hyperlipidemia   . Hyperthyroidism   . Obesity hypoventilation syndrome 06/20/2013    Past Surgical History  Procedure Laterality Date  . Ventral hernia repair      There were no vitals taken for this visit.  Visit Diagnosis:  No diagnosis found.      Subjective Assessment - 09/22/14 1349    Symptoms Patient arrives 6 min late.  Patient with flat affect.  Denies pain at present.   States the last time she had pain was when she was at therapy last time.   Presents with RW and ambulates very slowly.     Limitations Standing;Walking   Currently in Pain? No/denies   Pain Orientation Left   Pain Type Chronic pain   Aggravating Factors  Patient unable to determine   Pain Relieving Factors rest          [facility] PT Assessment - 09/22/14 0001    Ambulation/Gait   Ambulation/Gait --  TUG 44 sec with RW                  [facility] Adult PT Treatment/Exercise - 09/22/14 1351    Lumbar Exercises: Aerobic   Stationary Bike --  Nu-Step L3 6 min   Lumbar Exercises: Supine   Clam 10 reps   Bridge --  over green ball 10   Other Supine Lumbar Exercises --  hip/knee flex with LEs over ball 10x   Knee/Hip Exercises: Seated   Long Arc Quad --  Sit to stand from hi  table no UE 10x   Heel Slides Limitations --  hip flexion with red band 10x left/right   Other Seated Knee Exercises --  hip abd/ER red  band 30x   Other Seated Knee Exercises --  red band knee extension 10x right and left   Knee/Hip Exercises: Sidelying   Clams 10   Other Sidelying Knee Exercises --  hip flexor stretch 3x 30 sec   Moist Heat Therapy   Number Minutes Moist Heat 15 Minutes   Moist Heat Location --  right thigh;  seated in W/C                  PT Short Term Goals - 09/22/14 1546    PT SHORT TERM GOAL #1   Title Est initial HEP    Status Achieved   PT SHORT TERM GOAL #2   Title Improve hip extension PROM from -40 to -30 bilaterally   Time 3   Period Weeks   Status On-going           PT Long Term Goals - 09/22/14 1547    PT LONG TERM GOAL #1  Title TUG improve from 42 secs with RW to 30 secs with RW for improved mobility and decreased risk for falls.    Time 5   Period Weeks   Status On-going   PT LONG TERM GOAL #2   Title FOTO will improve from 70% disability on intake to 50% disability at discharge.   Time 5   Period Weeks   Status On-going   PT LONG TERM GOAL #3   Title Pt will be independent and compliant with daily HEP.    Time 5   Period Weeks   Status On-going   PT LONG TERM GOAL #4   Title Hip extension will improve to -20 degrees in order for pt to lay supine without increased pain into B LE   Time 5   Period Weeks   Status On-going   PT LONG TERM GOAL #5   Title Bil LE pain will improve from 8/10 to 4/10 at worst with functional activities.    Time 5   Period Weeks   Status On-going               Plan - 09/22/14 1541    Clinical Impression Statement Patient with slower progress with PT secondary to multiple co-morbidities including residual hemiparesis.  No further goals met.   No improvement in gait speed with TUG test.  Patient without complaint of pain in thigh except with attempts to stretch hip flexors and  with attempts to stand more erect.  Patient would like to continue with PT to prepare for an upcoming cruise.     PT Next Visit Plan Continue with hip flexor stretching, LE strengthening; Nu-Step;  exercise to encourage hip extension and gluteal activation;  Nu-Step; heat as needed for pain control        Problem List Patient Active Problem List   Diagnosis Date Noted  . Chest pain 01/19/2014  . Obesity hypoventilation syndrome 06/20/2013  . HYPERTHYROIDISM 06/28/2009  . DIAB W/UNS COMP TYPE II/UNS NOT STATED UNCNTRL 06/28/2009  . HYPERLIPIDEMIA-MIXED 06/28/2009  . OVERWEIGHT/OBESITY 06/28/2009  . HYPERTENSION, BENIGN 06/28/2009  . CAD, NATIVE VESSEL 06/28/2009  . DYSPNEA 06/28/2009   Ruben Im, PT 09/22/2014 3:51 PM Phone: [phone] Fax: [phone] Alvera Singh 09/22/2014, 3:50 PM  [facility] [address] [place], [place], [address] Phone: [phone]   Fax:  [phone]

## 2014-10-01 ENCOUNTER — Ambulatory Visit: Payer: Medicare Other | Attending: Cardiology | Admitting: Rehabilitation

## 2014-10-01 DIAGNOSIS — Z5189 Encounter for other specified aftercare: Secondary | ICD-10-CM | POA: Diagnosis present

## 2014-10-01 DIAGNOSIS — M25569 Pain in unspecified knee: Secondary | ICD-10-CM

## 2014-10-01 DIAGNOSIS — R5381 Other malaise: Secondary | ICD-10-CM | POA: Insufficient documentation

## 2014-10-01 DIAGNOSIS — R279 Unspecified lack of coordination: Secondary | ICD-10-CM | POA: Diagnosis not present

## 2014-10-01 DIAGNOSIS — I252 Old myocardial infarction: Secondary | ICD-10-CM | POA: Diagnosis not present

## 2014-10-01 DIAGNOSIS — Z8673 Personal history of transient ischemic attack (TIA), and cerebral infarction without residual deficits: Secondary | ICD-10-CM | POA: Insufficient documentation

## 2014-10-01 DIAGNOSIS — R262 Difficulty in walking, not elsewhere classified: Secondary | ICD-10-CM | POA: Insufficient documentation

## 2014-10-01 NOTE — Therapy (Signed)
[place], [place], [address] Phone: [phone]   Fax:  [phone]  Physical Therapy Treatment  Patient Details  Name: Angelica Beck MRN: [id number] Date of Birth: [birth year] Referring Provider:  Antonietta Jewel, MD  Encounter Date: 10/01/2014      PT End of Session - 10/01/14 1416    Visit Number 8   Number of Visits 20   Date for PT Re-Evaluation 10/20/14   PT Start Time 0136   PT Stop Time 0214   PT Time Calculation (min) 38 min      Past Medical History  Diagnosis Date  . Hypertension   . Stroke   . Arthritis   . Diabetes mellitus, type 2   . Hyperlipidemia   . Hyperthyroidism   . Obesity hypoventilation syndrome 06/20/2013    Past Surgical History  Procedure Laterality Date  . Ventral hernia repair      There were no vitals taken for this visit.  Visit Diagnosis:  Pain in joint, lower leg, unspecified laterality  Difficulty in walking      Subjective Assessment - 10/01/14 1423    Symptoms Pt reports only pain with standing upright and lying supine right anterior thigh.   Currently in Pain? No/denies   Aggravating Factors  hip extension   Pain Relieving Factors rest                    [facility] Adult PT Treatment/Exercise - 10/01/14 1420    Ambulation/Gait   Ambulation/Gait Yes   Ambulation/Gait Assistance 5: Supervision   Ambulation/Gait Assistance Details Cues for trunk extension/posture   Ambulation Distance (Feet) 25 Feet   Assistive device Rolling walker   Gait Pattern Step-through pattern   Lumbar Exercises: Aerobic   Stationary Bike Nustep level 6 x 10 minutes   Lumbar Exercises: Supine   Bent Knee Raise 10 reps  bilateral   Bridge 10 reps   Other Supine Lumbar Exercises Bridge with ball squeeze x 10   Other Supine Lumbar Exercises supine spring board hip extension x 15 each side.                  PT Short Term Goals - 09/22/14 1546    PT  SHORT TERM GOAL #1   Title Est initial HEP    Status Achieved   PT SHORT TERM GOAL #2   Title Improve hip extension PROM from -40 to -30 bilaterally   Time 3   Period Weeks   Status On-going           PT Long Term Goals - 10/01/14 1430    PT LONG TERM GOAL #1   Title TUG improve from 42 secs with RW to 30 secs with RW for improved mobility and decreased risk for falls.    Time 5   Period Weeks   Status On-going   PT LONG TERM GOAL #2   Title FOTO will improve from 70% disability on intake to 50% disability at discharge.   Time 5   Period Weeks   Status On-going   PT LONG TERM GOAL #3   Title Pt will be independent and compliant with daily HEP.    Time 5   Period Weeks   Status On-going   PT LONG TERM GOAL #4   Title Hip extension will improve to -20 degrees in order for pt to lay supine without increased pain into B LE   Time 5   Period  Weeks   Status Achieved   PT LONG TERM GOAL #5   Title Bil LE pain will improve from 8/10 to 4/10 at worst with functional activities.    Time 5   Period Weeks   Status On-going               Plan - 10/01/14 1417    Clinical Impression Statement Patient is able to fully extend right hip and knee supine on mat table with increased pain. She also demonstrates improved upright posture with gait  and less right thigh pain with trunk extension.    PT Next Visit Plan Continue with hip flexor stretching, LE strengthening; Nu-Step;  exercise to encourage hip extension and gluteal activation;  Nu-Step; heat as needed for pain control        Problem List Patient Active Problem List   Diagnosis Date Noted  . Chest pain 01/19/2014  . Obesity hypoventilation syndrome 06/20/2013  . HYPERTHYROIDISM 06/28/2009  . DIAB W/UNS COMP TYPE II/UNS NOT STATED UNCNTRL 06/28/2009  . HYPERLIPIDEMIA-MIXED 06/28/2009  . OVERWEIGHT/OBESITY 06/28/2009  . HYPERTENSION, BENIGN 06/28/2009  . CAD, NATIVE VESSEL 06/28/2009  . DYSPNEA 06/28/2009     Dorene Ar, PTA 10/01/2014, 2:31 PM  [facility] [address] [place], [place], [address] Phone: [phone]   Fax:  [phone]

## 2014-10-06 ENCOUNTER — Ambulatory Visit: Payer: Medicare Other | Admitting: Physical Therapy

## 2014-10-08 ENCOUNTER — Ambulatory Visit: Payer: Medicare Other | Admitting: Physical Therapy

## 2014-10-08 VITALS — BP 140/90 | HR 93

## 2014-10-08 DIAGNOSIS — M25569 Pain in unspecified knee: Secondary | ICD-10-CM

## 2014-10-08 DIAGNOSIS — R279 Unspecified lack of coordination: Secondary | ICD-10-CM | POA: Diagnosis not present

## 2014-10-08 DIAGNOSIS — R262 Difficulty in walking, not elsewhere classified: Secondary | ICD-10-CM | POA: Diagnosis not present

## 2014-10-08 DIAGNOSIS — I252 Old myocardial infarction: Secondary | ICD-10-CM | POA: Diagnosis not present

## 2014-10-08 DIAGNOSIS — Z8673 Personal history of transient ischemic attack (TIA), and cerebral infarction without residual deficits: Secondary | ICD-10-CM | POA: Diagnosis not present

## 2014-10-08 DIAGNOSIS — R5381 Other malaise: Secondary | ICD-10-CM | POA: Diagnosis not present

## 2014-10-08 NOTE — Patient Instructions (Signed)
Hip Flexor Stretch   Lying on back near edge of bed, bend one leg, foot flat. Hang other leg over edge, relaxed, thigh resting entirely on bed for __1__ minutes. Repeat __3__ times. Do 2____ sessions per day.  BEGIN BY TRYING TO GET YOUR THIGH FLAT AGAINST THE BED. Advanced Exercise: Bend knee back keeping thigh in contact with bed.  http://gt2.exer.us/347   Copyright  VHI. All rights reserved.  CAREGIVER ASSISTED: Hip Flexors - Side-Lying   Caregiver pulls leg backward, using other hand to push hips gently forward. Hold __30-60_ seconds. __2-3_ reps per set, _2__ sets per day, __7_ days per week Place moving hand under knee to keep leg parallel to floor during backward pull.  Copyright  VHI. All rights reserved.

## 2014-10-08 NOTE — Therapy (Signed)
[place], [place], [address] Phone: [phone]   Fax:  [phone]  Physical Therapy Treatment  Patient Details  Name: Angelica Beck MRN: [id number] Date of Birth: 02/13/1951 Referring Provider:  Antonietta Jewel, MD  Encounter Date: 10/08/2014      PT End of Session - 10/08/14 2239    Visit Number 9   Number of Visits 20   Date for PT Re-Evaluation 10/20/14   PT Start Time 1332   PT Stop Time 1430   PT Time Calculation (min) 58 min   Activity Tolerance Other (comment)  Poor tolerance to activity, cognition vs pain v fatigue   Behavior During Therapy Flat affect      Past Medical History  Diagnosis Date  . Hypertension   . Stroke   . Arthritis   . Diabetes mellitus, type 2   . Hyperlipidemia   . Hyperthyroidism   . Obesity hypoventilation syndrome 06/20/2013    Past Surgical History  Procedure Laterality Date  . Ventral hernia repair      BP 140/90 mmHg  Pulse 93  SpO2 96%  Visit Diagnosis:  Pain in joint, lower leg, unspecified laterality  Difficulty in walking      Subjective Assessment - 10/08/14 1336    Symptoms No pain today, used walker to get back to clinic.     How long can you stand comfortably? unsure   How long can you walk comfortably? walked from waiting area to middle of gym with walker, no pain    Patient Stated Goals Less pain with walking. Improve walking for boat cruise in April   Currently in Pain? No/denies  Pain with bed mobility, stretches, unrated and unable to quantify.    Aggravating Factors  hip extension   Pain Relieving Factors sitting, lying on side with knees and hips bent.           [facility] PT Assessment - 10/08/14 1350    Cognition   Overall Cognitive Status No family/caregiver present to determine baseline cognitive functioning   Posture/Postural Control   Posture Comments unable to correct with cues in standing    Bed Mobility   Supine to  Sit 5: Supervision   Sit to Supine 4: Min assist                  [facility] Adult PT Treatment/Exercise - 10/08/14 1350    Ambulation/Gait   Ambulation/Gait Yes   Ambulation/Gait Assistance 6: Modified independent (Device/Increase time)   Ambulation Distance (Feet) --  100   Assistive device Rolling walker   Gait Pattern Step-to pattern;Decreased step length - right;Decreased step length - left;Decreased stride length   Ambulation Surface Level   Posture/Postural Control   Posture/Postural Control Postural limitations   Postural Limitations Rounded Shoulders;Forward head;Flexed trunk   Lumbar Exercises: Standing   Other Standing Lumbar Exercises stand post. pelvic tilt, min movment noted   Other Standing Lumbar Exercises B UE elevation to encourage spinal ext.    Knee/Hip Exercises: Stretches   Hip Flexor Stretch 5 reps;30 seconds   Hip Flexor Stretch Limitations --  in sidelying PROM to R -18 deg hip ext, L -10   Knee/Hip Exercises: Supine   Bridges 1 set;10 reps   Other Supine Knee Exercises hip ER/IR Rt leg   Other Supine Knee Exercises --  Glute set x 10    Knee/Hip Exercises: Sidelying   Clams 20   Moist Heat Therapy   Number Minutes Moist  Heat 15 Minutes   Moist Heat Location Other (comment)  R ant hip in sitting                 PT Education - 10/08/14 2238    Education provided Yes   Education Details Need to stretch, stand and walk. Hip flexibility and contracture   Person(s) Educated Patient   Methods Explanation;Demonstration;Handout   Comprehension Need further instruction          PT Short Term Goals - 10/08/14 1343    PT SHORT TERM GOAL #1   Title Est initial HEP    Status Achieved   PT SHORT TERM GOAL #2   Title Improve hip extension PROM from -40 to -30 bilaterally   Baseline Rt. -35 deg and Lt. -15 deg. in supine   Status Partially Met           PT Long Term Goals - 10/08/14 1348    PT LONG TERM GOAL #1   Title TUG improve  from 42 secs with RW to 30 secs with RW for improved mobility and decreased risk for falls.    PT LONG TERM GOAL #2   Title FOTO will improve from 70% disability on intake to 50% disability at discharge.   PT LONG TERM GOAL #3   Title Pt will be independent and compliant with daily HEP.    PT LONG TERM GOAL #4   Title Hip extension will improve to -20 degrees in order for pt to lay supine without increased pain into B LE   Status Partially Met   PT LONG TERM GOAL #5   Title Bil LE pain will improve from 8/10 to 4/10 at worst with functional activities.                Plan - 10/08/14 2243    Clinical Impression Statement Patient demonstrated inability to fully extend hip as previously.  She does not articulate goals for therapy, states she has no pain with ADLs.  She was given HEP today that her caregiver may asssit her with.    Pt will benefit from skilled therapeutic intervention in order to improve on the following deficits Abnormal gait;Decreased activity tolerance;Decreased mobility;Decreased strength;Pain;Impaired flexibility;Difficulty walking;Decreased range of motion;Increased edema;Obesity   PT Next Visit Plan Continue with hip flexor stretching, LE strengthening; Nu-Step;  exercise to encourage hip extension and gluteal activation;  Nu-Step; heat as needed for pain control   PT Home Exercise Plan given A hip flexor stretch in sidelying   Consulted and Agree with Plan of Care Patient        Problem List Patient Active Problem List   Diagnosis Date Noted  . Chest pain 01/19/2014  . Obesity hypoventilation syndrome 06/20/2013  . HYPERTHYROIDISM 06/28/2009  . DIAB W/UNS COMP TYPE II/UNS NOT STATED UNCNTRL 06/28/2009  . HYPERLIPIDEMIA-MIXED 06/28/2009  . OVERWEIGHT/OBESITY 06/28/2009  . HYPERTENSION, BENIGN 06/28/2009  . CAD, NATIVE VESSEL 06/28/2009  . DYSPNEA 06/28/2009    PAA,JENNIFER 10/08/2014, 10:45 PM  [facility] [address] [place], [place], [address] Phone: [phone]   Fax:  [phone]  Raeford Razor, PT 10/08/2014 10:46 PM Phone: [phone] Fax: [phone]

## 2014-12-23 DIAGNOSIS — R079 Chest pain, unspecified: Secondary | ICD-10-CM | POA: Diagnosis not present

## 2014-12-23 DIAGNOSIS — I10 Essential (primary) hypertension: Secondary | ICD-10-CM | POA: Diagnosis not present

## 2014-12-29 DIAGNOSIS — E559 Vitamin D deficiency, unspecified: Secondary | ICD-10-CM | POA: Diagnosis not present

## 2014-12-29 DIAGNOSIS — E119 Type 2 diabetes mellitus without complications: Secondary | ICD-10-CM | POA: Diagnosis not present

## 2014-12-29 DIAGNOSIS — R899 Unspecified abnormal finding in specimens from other organs, systems and tissues: Secondary | ICD-10-CM | POA: Diagnosis not present

## 2014-12-29 DIAGNOSIS — R5383 Other fatigue: Secondary | ICD-10-CM | POA: Diagnosis not present

## 2015-02-07 ENCOUNTER — Inpatient Hospital Stay (HOSPITAL_COMMUNITY)
Admission: EM | Admit: 2015-02-07 | Discharge: 2015-02-10 | DRG: 683 | Disposition: A | Discharge status: Home / Self Care | Payer: Medicare Other | Attending: Internal Medicine | Admitting: Internal Medicine

## 2015-02-07 ENCOUNTER — Encounter (HOSPITAL_COMMUNITY): Payer: Self-pay | Admitting: Emergency Medicine

## 2015-02-07 DIAGNOSIS — N179 Acute kidney failure, unspecified: Principal | ICD-10-CM | POA: Diagnosis present

## 2015-02-07 DIAGNOSIS — Z794 Long term (current) use of insulin: Secondary | ICD-10-CM

## 2015-02-07 DIAGNOSIS — E1122 Type 2 diabetes mellitus with diabetic chronic kidney disease: Secondary | ICD-10-CM

## 2015-02-07 DIAGNOSIS — E662 Morbid (severe) obesity with alveolar hypoventilation: Secondary | ICD-10-CM | POA: Diagnosis present

## 2015-02-07 DIAGNOSIS — I10 Essential (primary) hypertension: Secondary | ICD-10-CM | POA: Diagnosis not present

## 2015-02-07 DIAGNOSIS — E860 Dehydration: Secondary | ICD-10-CM | POA: Diagnosis present

## 2015-02-07 DIAGNOSIS — E0590 Thyrotoxicosis, unspecified without thyrotoxic crisis or storm: Secondary | ICD-10-CM | POA: Diagnosis present

## 2015-02-07 DIAGNOSIS — M1990 Unspecified osteoarthritis, unspecified site: Secondary | ICD-10-CM | POA: Diagnosis present

## 2015-02-07 DIAGNOSIS — N390 Urinary tract infection, site not specified: Secondary | ICD-10-CM | POA: Diagnosis present

## 2015-02-07 DIAGNOSIS — Z7902 Long term (current) use of antithrombotics/antiplatelets: Secondary | ICD-10-CM

## 2015-02-07 DIAGNOSIS — Z6841 Body Mass Index (BMI) 40.0 and over, adult: Secondary | ICD-10-CM | POA: Diagnosis not present

## 2015-02-07 DIAGNOSIS — R55 Syncope and collapse: Secondary | ICD-10-CM | POA: Diagnosis not present

## 2015-02-07 DIAGNOSIS — Z79899 Other long term (current) drug therapy: Secondary | ICD-10-CM

## 2015-02-07 DIAGNOSIS — J9811 Atelectasis: Secondary | ICD-10-CM | POA: Diagnosis not present

## 2015-02-07 DIAGNOSIS — I2510 Atherosclerotic heart disease of native coronary artery without angina pectoris: Secondary | ICD-10-CM | POA: Diagnosis present

## 2015-02-07 DIAGNOSIS — I129 Hypertensive chronic kidney disease with stage 1 through stage 4 chronic kidney disease, or unspecified chronic kidney disease: Secondary | ICD-10-CM

## 2015-02-07 DIAGNOSIS — Z87891 Personal history of nicotine dependence: Secondary | ICD-10-CM

## 2015-02-07 DIAGNOSIS — E785 Hyperlipidemia, unspecified: Secondary | ICD-10-CM | POA: Diagnosis present

## 2015-02-07 DIAGNOSIS — R531 Weakness: Secondary | ICD-10-CM

## 2015-02-07 DIAGNOSIS — IMO0001 Reserved for inherently not codable concepts without codable children: Secondary | ICD-10-CM

## 2015-02-07 DIAGNOSIS — E871 Hypo-osmolality and hyponatremia: Secondary | ICD-10-CM

## 2015-02-07 DIAGNOSIS — E119 Type 2 diabetes mellitus without complications: Secondary | ICD-10-CM | POA: Diagnosis present

## 2015-02-07 DIAGNOSIS — R269 Unspecified abnormalities of gait and mobility: Secondary | ICD-10-CM

## 2015-02-07 DIAGNOSIS — Z8673 Personal history of transient ischemic attack (TIA), and cerebral infarction without residual deficits: Secondary | ICD-10-CM

## 2015-02-07 DIAGNOSIS — E875 Hyperkalemia: Secondary | ICD-10-CM | POA: Diagnosis present

## 2015-02-07 DIAGNOSIS — Z7982 Long term (current) use of aspirin: Secondary | ICD-10-CM

## 2015-02-07 DIAGNOSIS — R000 Tachycardia, unspecified: Secondary | ICD-10-CM | POA: Diagnosis not present

## 2015-02-07 DIAGNOSIS — Z880 Allergy status to penicillin: Secondary | ICD-10-CM

## 2015-02-07 DIAGNOSIS — R0989 Other specified symptoms and signs involving the circulatory and respiratory systems: Secondary | ICD-10-CM | POA: Diagnosis not present

## 2015-02-07 DIAGNOSIS — L8992 Pressure ulcer of unspecified site, stage 2: Secondary | ICD-10-CM | POA: Diagnosis not present

## 2015-02-07 NOTE — ED Notes (Signed)
Pt arrives via EMS from home with c/o SOB, initial O2 sats 93%, placed on 2L.HR 150. Per EMS, very SOB on exertion. Sudden onset weakness bilateral legs. Per EMS, onset occurred when pt was climbing up stairs.

## 2015-02-08 ENCOUNTER — Inpatient Hospital Stay (HOSPITAL_COMMUNITY): Payer: Medicare Other

## 2015-02-08 ENCOUNTER — Emergency Department (HOSPITAL_COMMUNITY): Payer: Medicare Other

## 2015-02-08 DIAGNOSIS — E875 Hyperkalemia: Secondary | ICD-10-CM | POA: Diagnosis present

## 2015-02-08 DIAGNOSIS — N390 Urinary tract infection, site not specified: Secondary | ICD-10-CM | POA: Diagnosis present

## 2015-02-08 DIAGNOSIS — N179 Acute kidney failure, unspecified: Secondary | ICD-10-CM | POA: Diagnosis present

## 2015-02-08 DIAGNOSIS — R269 Unspecified abnormalities of gait and mobility: Secondary | ICD-10-CM | POA: Diagnosis not present

## 2015-02-08 DIAGNOSIS — Z87891 Personal history of nicotine dependence: Secondary | ICD-10-CM | POA: Diagnosis not present

## 2015-02-08 DIAGNOSIS — Z880 Allergy status to penicillin: Secondary | ICD-10-CM | POA: Diagnosis not present

## 2015-02-08 DIAGNOSIS — I2510 Atherosclerotic heart disease of native coronary artery without angina pectoris: Secondary | ICD-10-CM | POA: Diagnosis present

## 2015-02-08 DIAGNOSIS — E1122 Type 2 diabetes mellitus with diabetic chronic kidney disease: Secondary | ICD-10-CM

## 2015-02-08 DIAGNOSIS — I10 Essential (primary) hypertension: Secondary | ICD-10-CM | POA: Diagnosis not present

## 2015-02-08 DIAGNOSIS — Z79899 Other long term (current) drug therapy: Secondary | ICD-10-CM | POA: Diagnosis not present

## 2015-02-08 DIAGNOSIS — Z6841 Body Mass Index (BMI) 40.0 and over, adult: Secondary | ICD-10-CM | POA: Diagnosis not present

## 2015-02-08 DIAGNOSIS — R55 Syncope and collapse: Secondary | ICD-10-CM | POA: Diagnosis not present

## 2015-02-08 DIAGNOSIS — R0989 Other specified symptoms and signs involving the circulatory and respiratory systems: Secondary | ICD-10-CM | POA: Diagnosis not present

## 2015-02-08 DIAGNOSIS — N2889 Other specified disorders of kidney and ureter: Secondary | ICD-10-CM | POA: Diagnosis not present

## 2015-02-08 DIAGNOSIS — E0590 Thyrotoxicosis, unspecified without thyrotoxic crisis or storm: Secondary | ICD-10-CM | POA: Diagnosis not present

## 2015-02-08 DIAGNOSIS — E871 Hypo-osmolality and hyponatremia: Secondary | ICD-10-CM | POA: Diagnosis present

## 2015-02-08 DIAGNOSIS — N281 Cyst of kidney, acquired: Secondary | ICD-10-CM | POA: Diagnosis not present

## 2015-02-08 DIAGNOSIS — Z8673 Personal history of transient ischemic attack (TIA), and cerebral infarction without residual deficits: Secondary | ICD-10-CM | POA: Diagnosis not present

## 2015-02-08 DIAGNOSIS — E860 Dehydration: Secondary | ICD-10-CM | POA: Diagnosis present

## 2015-02-08 DIAGNOSIS — J9811 Atelectasis: Secondary | ICD-10-CM | POA: Diagnosis not present

## 2015-02-08 DIAGNOSIS — E662 Morbid (severe) obesity with alveolar hypoventilation: Secondary | ICD-10-CM | POA: Diagnosis present

## 2015-02-08 DIAGNOSIS — Z7982 Long term (current) use of aspirin: Secondary | ICD-10-CM | POA: Diagnosis not present

## 2015-02-08 DIAGNOSIS — IMO0001 Reserved for inherently not codable concepts without codable children: Secondary | ICD-10-CM

## 2015-02-08 DIAGNOSIS — E785 Hyperlipidemia, unspecified: Secondary | ICD-10-CM | POA: Diagnosis present

## 2015-02-08 DIAGNOSIS — Z794 Long term (current) use of insulin: Secondary | ICD-10-CM | POA: Diagnosis not present

## 2015-02-08 DIAGNOSIS — Z7902 Long term (current) use of antithrombotics/antiplatelets: Secondary | ICD-10-CM | POA: Diagnosis not present

## 2015-02-08 DIAGNOSIS — E119 Type 2 diabetes mellitus without complications: Secondary | ICD-10-CM | POA: Diagnosis present

## 2015-02-08 DIAGNOSIS — R531 Weakness: Secondary | ICD-10-CM | POA: Diagnosis not present

## 2015-02-08 DIAGNOSIS — M1990 Unspecified osteoarthritis, unspecified site: Secondary | ICD-10-CM | POA: Diagnosis present

## 2015-02-08 DIAGNOSIS — I129 Hypertensive chronic kidney disease with stage 1 through stage 4 chronic kidney disease, or unspecified chronic kidney disease: Secondary | ICD-10-CM

## 2015-02-08 LAB — CBC WITH DIFFERENTIAL/PLATELET
Basophils Absolute: 0 10*3/uL (ref 0.0–0.1)
Basophils Relative: 0 % (ref 0–1)
EOS ABS: 0.1 10*3/uL (ref 0.0–0.7)
EOS PCT: 1 % (ref 0–5)
HCT: 29 % — ABNORMAL LOW (ref 36.0–46.0)
Hemoglobin: 9.1 g/dL — ABNORMAL LOW (ref 12.0–15.0)
LYMPHS PCT: 18 % (ref 12–46)
Lymphs Abs: 1.7 10*3/uL (ref 0.7–4.0)
MCH: 24.2 pg — ABNORMAL LOW (ref 26.0–34.0)
MCHC: 31.4 g/dL (ref 30.0–36.0)
MCV: 77.1 fL — ABNORMAL LOW (ref 78.0–100.0)
Monocytes Absolute: 0.6 10*3/uL (ref 0.1–1.0)
Monocytes Relative: 6 % (ref 3–12)
Neutro Abs: 7 10*3/uL (ref 1.7–7.7)
Neutrophils Relative %: 75 % (ref 43–77)
PLATELETS: 217 10*3/uL (ref 150–400)
RBC: 3.76 MIL/uL — AB (ref 3.87–5.11)
RDW: 17.1 % — ABNORMAL HIGH (ref 11.5–15.5)
WBC: 9.4 10*3/uL (ref 4.0–10.5)

## 2015-02-08 LAB — COMPREHENSIVE METABOLIC PANEL
ALBUMIN: 2.9 g/dL — AB (ref 3.5–5.0)
ALT: 8 U/L — AB (ref 14–54)
AST: 16 U/L (ref 15–41)
Alkaline Phosphatase: 60 U/L (ref 38–126)
Anion gap: 6 (ref 5–15)
BUN: 25 mg/dL — ABNORMAL HIGH (ref 6–20)
CO2: 21 mmol/L — ABNORMAL LOW (ref 22–32)
Calcium: 8.9 mg/dL (ref 8.9–10.3)
Chloride: 104 mmol/L (ref 101–111)
Creatinine, Ser: 2.15 mg/dL — ABNORMAL HIGH (ref 0.44–1.00)
GFR calc Af Amer: 27 mL/min — ABNORMAL LOW (ref >60)
GFR calc non Af Amer: 23 mL/min — ABNORMAL LOW (ref >60)
Glucose, Bld: 127 mg/dL — ABNORMAL HIGH (ref 65–99)
POTASSIUM: 5.2 mmol/L — AB (ref 3.5–5.1)
Sodium: 131 mmol/L — ABNORMAL LOW (ref 135–145)
Total Bilirubin: 0.4 mg/dL (ref 0.3–1.2)
Total Protein: 6.7 g/dL (ref 6.5–8.1)

## 2015-02-08 LAB — GLUCOSE, CAPILLARY
GLUCOSE-CAPILLARY: 181 mg/dL — AB (ref 65–99)
Glucose-Capillary: 133 mg/dL — ABNORMAL HIGH (ref 65–99)
Glucose-Capillary: 159 mg/dL — ABNORMAL HIGH (ref 65–99)
Glucose-Capillary: 164 mg/dL — ABNORMAL HIGH (ref 65–99)

## 2015-02-08 LAB — PROTIME-INR
INR: 1.13 (ref 0.00–1.49)
PROTHROMBIN TIME: 14.7 s (ref 11.6–15.2)

## 2015-02-08 LAB — BASIC METABOLIC PANEL
Anion gap: 11 (ref 5–15)
BUN: 28 mg/dL — AB (ref 6–20)
CO2: 18 mmol/L — ABNORMAL LOW (ref 22–32)
CREATININE: 2.45 mg/dL — AB (ref 0.44–1.00)
Calcium: 9.5 mg/dL (ref 8.9–10.3)
Chloride: 100 mmol/L — ABNORMAL LOW (ref 101–111)
GFR, EST AFRICAN AMERICAN: 23 mL/min — AB (ref >60)
GFR, EST NON AFRICAN AMERICAN: 20 mL/min — AB (ref >60)
Glucose, Bld: 195 mg/dL — ABNORMAL HIGH (ref 65–99)
Potassium: 5.9 mmol/L — ABNORMAL HIGH (ref 3.5–5.1)
Sodium: 129 mmol/L — ABNORMAL LOW (ref 135–145)

## 2015-02-08 LAB — CBC
HEMATOCRIT: 32.2 % — AB (ref 36.0–46.0)
HEMOGLOBIN: 10.3 g/dL — AB (ref 12.0–15.0)
MCH: 24.8 pg — AB (ref 26.0–34.0)
MCHC: 32 g/dL (ref 30.0–36.0)
MCV: 77.4 fL — ABNORMAL LOW (ref 78.0–100.0)
Platelets: 262 10*3/uL (ref 150–400)
RBC: 4.16 MIL/uL (ref 3.87–5.11)
RDW: 17.2 % — ABNORMAL HIGH (ref 11.5–15.5)
WBC: 12.4 10*3/uL — ABNORMAL HIGH (ref 4.0–10.5)

## 2015-02-08 LAB — TSH: TSH: 0.237 u[IU]/mL — ABNORMAL LOW (ref 0.350–4.500)

## 2015-02-08 LAB — URINALYSIS, ROUTINE W REFLEX MICROSCOPIC
Bilirubin Urine: NEGATIVE
Glucose, UA: NEGATIVE mg/dL
KETONES UR: NEGATIVE mg/dL
NITRITE: POSITIVE — AB
PROTEIN: 30 mg/dL — AB
Specific Gravity, Urine: 1.012 (ref 1.005–1.030)
Urobilinogen, UA: 0.2 mg/dL (ref 0.0–1.0)
pH: 5 (ref 5.0–8.0)

## 2015-02-08 LAB — MAGNESIUM: MAGNESIUM: 1.4 mg/dL — AB (ref 1.7–2.4)

## 2015-02-08 LAB — URINE MICROSCOPIC-ADD ON

## 2015-02-08 LAB — PHOSPHORUS: PHOSPHORUS: 3.7 mg/dL (ref 2.5–4.6)

## 2015-02-08 LAB — BRAIN NATRIURETIC PEPTIDE: B Natriuretic Peptide: 11 pg/mL (ref 0.0–100.0)

## 2015-02-08 LAB — I-STAT TROPONIN, ED: Troponin i, poc: 0.01 ng/mL (ref 0.00–0.08)

## 2015-02-08 MED ORDER — HEPARIN SODIUM (PORCINE) 5000 UNIT/ML IJ SOLN
5000.0000 [IU] | Freq: Three times a day (TID) | INTRAMUSCULAR | Status: DC
  Administered 2015-02-08 – 2015-02-10 (×6): 5000 [IU] via SUBCUTANEOUS
  Filled 2015-02-08 (×9): qty 1

## 2015-02-08 MED ORDER — INSULIN ASPART 100 UNIT/ML ~~LOC~~ SOLN: 0.0000 [IU] | Freq: Every day | SUBCUTANEOUS | Status: DC

## 2015-02-08 MED ORDER — SODIUM CHLORIDE 0.9 % IV SOLN
Freq: Once | INTRAVENOUS | Status: AC
  Administered 2015-02-08: 03:00:00 via INTRAVENOUS

## 2015-02-08 MED ORDER — CIPROFLOXACIN IN D5W 400 MG/200ML IV SOLN
400.0000 mg | Freq: Once | INTRAVENOUS | Status: AC
  Administered 2015-02-08: 400 mg via INTRAVENOUS
  Filled 2015-02-08: qty 200

## 2015-02-08 MED ORDER — SODIUM CHLORIDE 0.9 % IV BOLUS (SEPSIS)
500.0000 mL | Freq: Once | INTRAVENOUS | Status: AC
  Administered 2015-02-08: 500 mL via INTRAVENOUS

## 2015-02-08 MED ORDER — INSULIN ASPART 100 UNIT/ML ~~LOC~~ SOLN
0.0000 [IU] | Freq: Three times a day (TID) | SUBCUTANEOUS | Status: DC
  Administered 2015-02-08 (×2): 3 [IU] via SUBCUTANEOUS
  Administered 2015-02-08 – 2015-02-09 (×3): 2 [IU] via SUBCUTANEOUS
  Administered 2015-02-10: 3 [IU] via SUBCUTANEOUS
  Administered 2015-02-10: 2 [IU] via SUBCUTANEOUS

## 2015-02-08 MED ORDER — SODIUM CHLORIDE 0.9 % IV BOLUS (SEPSIS)
1000.0000 mL | Freq: Once | INTRAVENOUS | Status: AC
  Administered 2015-02-08: 1000 mL via INTRAVENOUS

## 2015-02-08 MED ORDER — INSULIN GLARGINE 100 UNIT/ML ~~LOC~~ SOLN
15.0000 [IU] | Freq: Two times a day (BID) | SUBCUTANEOUS | Status: DC
  Administered 2015-02-08 – 2015-02-10 (×5): 15 [IU] via SUBCUTANEOUS
  Filled 2015-02-08 (×7): qty 0.15

## 2015-02-08 MED ORDER — NEPRO/CARBSTEADY PO LIQD
237.0000 mL | Freq: Two times a day (BID) | ORAL | Status: DC
  Administered 2015-02-08 – 2015-02-10 (×5): 237 mL via ORAL
  Filled 2015-02-08 (×8): qty 237

## 2015-02-08 MED ORDER — SODIUM CHLORIDE 0.9 % IV SOLN
INTRAVENOUS | Status: DC
  Administered 2015-02-08: 06:00:00 via INTRAVENOUS
  Administered 2015-02-08: 1000 mL via INTRAVENOUS
  Administered 2015-02-09 – 2015-02-10 (×3): via INTRAVENOUS

## 2015-02-08 MED ORDER — FAMOTIDINE 20 MG PO TABS
20.0000 mg | ORAL_TABLET | Freq: Every day | ORAL | Status: DC
  Administered 2015-02-08 – 2015-02-10 (×3): 20 mg via ORAL
  Filled 2015-02-08 (×3): qty 1

## 2015-02-08 MED ORDER — CLOPIDOGREL BISULFATE 75 MG PO TABS
75.0000 mg | ORAL_TABLET | Freq: Every day | ORAL | Status: DC
Start: 2015-02-08 — End: 2015-02-11
  Administered 2015-02-08 – 2015-02-10 (×3): 75 mg via ORAL
  Filled 2015-02-08 (×3): qty 1

## 2015-02-08 MED ORDER — METOPROLOL SUCCINATE ER 50 MG PO TB24
50.0000 mg | ORAL_TABLET | Freq: Every day | ORAL | Status: DC
  Administered 2015-02-09 – 2015-02-10 (×2): 50 mg via ORAL
  Filled 2015-02-08 (×2): qty 1

## 2015-02-08 MED ORDER — ASPIRIN EC 81 MG PO TBEC
81.0000 mg | DELAYED_RELEASE_TABLET | Freq: Every day | ORAL | Status: DC
  Administered 2015-02-08 – 2015-02-10 (×3): 81 mg via ORAL
  Filled 2015-02-08 (×3): qty 1

## 2015-02-08 MED ORDER — TRAZODONE HCL 50 MG PO TABS
50.0000 mg | ORAL_TABLET | Freq: Every evening | ORAL | Status: DC | PRN
  Filled 2015-02-08: qty 1

## 2015-02-08 MED ORDER — METOPROLOL SUCCINATE ER 25 MG PO TB24
25.0000 mg | ORAL_TABLET | Freq: Every day | ORAL | Status: DC
  Administered 2015-02-08: 25 mg via ORAL
  Filled 2015-02-08: qty 1

## 2015-02-08 MED ORDER — SODIUM CHLORIDE 0.9 % IJ SOLN
3.0000 mL | Freq: Two times a day (BID) | INTRAMUSCULAR | Status: DC
  Administered 2015-02-08 – 2015-02-09 (×3): 3 mL via INTRAVENOUS

## 2015-02-08 MED ORDER — PROPYLTHIOURACIL 50 MG PO TABS
50.0000 mg | ORAL_TABLET | Freq: Every day | ORAL | Status: DC
Start: 2015-02-08 — End: 2015-02-11
  Administered 2015-02-08 – 2015-02-10 (×3): 50 mg via ORAL
  Filled 2015-02-08 (×3): qty 1

## 2015-02-08 MED ORDER — SODIUM CHLORIDE 0.9 % IV SOLN
1.0000 g | Freq: Once | INTRAVENOUS | Status: AC
  Administered 2015-02-08: 1 g via INTRAVENOUS
  Filled 2015-02-08: qty 10

## 2015-02-08 MED ORDER — CIPROFLOXACIN IN D5W 400 MG/200ML IV SOLN
400.0000 mg | INTRAVENOUS | Status: DC
  Administered 2015-02-08: 400 mg via INTRAVENOUS
  Filled 2015-02-08 (×2): qty 200

## 2015-02-08 MED ORDER — ROSUVASTATIN CALCIUM 40 MG PO TABS
40.0000 mg | ORAL_TABLET | Freq: Every day | ORAL | Status: DC
  Administered 2015-02-08 – 2015-02-10 (×3): 40 mg via ORAL
  Filled 2015-02-08 (×3): qty 1

## 2015-02-08 NOTE — ED Notes (Signed)
Attempted report 

## 2015-02-08 NOTE — H&P (Signed)
[facility] History and Physical  Patient: Angelica Beck  MRN: [id number]  DOB: August 27, 1951  DOS: the patient was seen and examined on' \\5'$ /16/2016 PCP: Antonietta Jewel, MD  Referring physician: Dr. Sharol Given Chief Complaint: Generalized weakness  HPI: Angelica Beck is a 64 y.o. female with Past medical history of hypertension, CVA, diabetes mellitus type 2, dyslipidemia, hyperthyroidism, obesity. The patient presents with complaints of generalized fatigue. She mentions that she was trying to climb the stairs of and when she was holding the railing and trying to turn he felt that she suddenly turned and lost her balance and had to sit on the stairs. She denies any fall or injury or head injury or neck injury. She was unable to stand up on her own due to fatigue and therefore they called the EMS. The patient denies any complaints of chest pain, abdominal pain, shortness of breath, dizziness, vertigo, focal deficit, diarrhea, vomiting, nausea, poor oral intake. She mentions she has occasional shortness of breath. She mentions a week ago she also had another near fall while she was going to the restroom. She thinks there is a change in her medications recently but she is not sure which medication.  The patient is coming from home. And at her baseline independent for most of her ADL.  Review of Systems: as mentioned in the history of present illness.  A comprehensive review of the other systems is negative.  Past Medical History  Diagnosis Date  . Hypertension   . Stroke   . Arthritis   . Diabetes mellitus, type 2   . Hyperlipidemia   . Hyperthyroidism   . Obesity hypoventilation syndrome 06/20/2013   Past Surgical History  Procedure Laterality Date  . Ventral hernia repair     Social History:  reports that she quit smoking about 12 years ago. Her smoking use included Cigarettes. She has a 7.5 pack-year smoking history. She has never used smokeless tobacco. She reports that she  does not drink alcohol or use illicit drugs.  Allergies  Allergen Reactions  . Penicillins Swelling    To face    Family History  Problem Relation Age of Onset  . Diabetes Mother     Prior to Admission medications   Medication Sig Start Date End Date Taking? Authorizing Provider  aspirin EC 81 MG tablet Take 1 tablet (81 mg total) by mouth daily. 01/21/14   Belkys A Regalado, MD  cholecalciferol (VITAMIN D) 1000 UNITS tablet Take 2,000 Units by mouth daily.    Historical Provider, MD  clopidogrel (PLAVIX) 75 MG tablet Take 1 tablet by mouth daily. 06/05/13   Historical Provider, MD  CRESTOR 40 MG tablet Take 1 tablet by mouth daily. 06/05/13   Historical Provider, MD  enalapril (VASOTEC) 20 MG tablet Take 1 tablet by mouth daily. 06/05/13   Historical Provider, MD  LANTUS SOLOSTAR 100 UNIT/ML SOPN Inject 15-30 Units into the skin 2 (two) times daily. 15 in the morning and 30 units at night 06/05/13   Historical Provider, MD  metFORMIN (GLUCOPHAGE) 500 MG tablet Take 1 tablet by mouth 2 (two) times daily. 06/05/13   Historical Provider, MD  metoprolol succinate (TOPROL-XL) 25 MG 24 hr tablet Take 1 tablet (25 mg total) by mouth daily. 01/21/14   Belkys A Regalado, MD  niacin (NIASPAN) 500 MG CR tablet Take 1 tablet by mouth 2 (two) times daily. 06/05/13   Historical Provider, MD  nitroGLYCERIN (NITROSTAT) 0.4 MG SL tablet Place 1 tablet (0.4 mg total) under the tongue  every 5 (five) minutes as needed for chest pain. 01/21/14   Belkys A Regalado, MD  NOVOLOG FLEXPEN 100 UNIT/ML SOPN FlexPen Inject 0-15 Units into the skin 3 (three) times daily with meals. Sliding scale 06/05/13   Historical Provider, MD  oxybutynin (DITROPAN XL) 15 MG 24 hr tablet Take 1 tablet by mouth daily. 06/05/13   Historical Provider, MD  potassium chloride SA (K-DUR,KLOR-CON) 20 MEQ tablet Take 1 tablet by mouth daily. 06/05/13   Historical Provider, MD  propylthiouracil (PTU) 50 MG tablet Take 1 tablet by mouth daily. 06/05/13    Historical Provider, MD  ranitidine (ZANTAC) 150 MG tablet Take 1 tablet by mouth daily. 06/05/13   Historical Provider, MD  traZODone (DESYREL) 50 MG tablet Take 1 tablet by mouth at bedtime. 06/05/13   Historical Provider, MD    Physical Exam: Filed Vitals:   02/08/15 0200 02/08/15 0230 02/08/15 0300 02/08/15 0400  BP: 113/53 117/58 119/84 125/56  Pulse: 107 108 105 104  Temp:    98.9 F (37.2 C)  TempSrc:    Oral  Resp: '17 17 10 17  '$ Height:    '5\' 4"'$  (1.626 m)  Weight:    106.096 kg (233 lb 14.4 oz)  SpO2: 99% 100% 99% 100%    General: Alert, Awake and Oriented to Time, Place and Person. Appear in mild distress Eyes: PERRL ENT: Oral Mucosa clear moist. Neck: difficult to assess JVD Cardiovascular: S1 and S2 Present, no Murmur, Peripheral Pulses Present Respiratory: Bilateral Air entry equal and Decreased,  Clear to Auscultation, on Crackles, no wheezes Abdomen: Bowel Sound present, Soft and non tender Skin: no Rash Extremities: Bilateral trace  Pedal edema, no calf tenderness Neurologic: Grossly no focal neuro deficit.  Labs on Admission:  CBC:  Recent Labs Lab 02/08/15  WBC 12.4*  HGB 10.3*  HCT 32.2*  MCV 77.4*  PLT 262    CMP     Component Value Date/Time   NA 129* 02/08/2015 0000   K 5.9* 02/08/2015 0000   CL 100* 02/08/2015 0000   CO2 18* 02/08/2015 0000   GLUCOSE 195* 02/08/2015 0000   BUN 28* 02/08/2015 0000   CREATININE 2.45* 02/08/2015 0000   CALCIUM 9.5 02/08/2015 0000   PROT 8.2 09/09/2010 2231   ALBUMIN 2.6* 09/09/2010 2231   AST 29 09/09/2010 2231   ALT 19 09/09/2010 2231   ALKPHOS 184* 09/09/2010 2231   BILITOT 0.2* 09/09/2010 2231   GFRNONAA 20* 02/08/2015 0000   GFRAA 23* 02/08/2015 0000    No results for input(s): LIPASE, AMYLASE in the last 168 hours.  No results for input(s): CKTOTAL, CKMB, CKMBINDEX, TROPONINI in the last 168 hours. BNP (last 3 results) No results for input(s): BNP in the last 8760 hours.  ProBNP (last 3  results) No results for input(s): PROBNP in the last 8760 hours.   Radiological Exams on Admission: Dg Chest 2 View (if Patient Has Fever And/or Copd)  02/08/2015   CLINICAL DATA:  Acute onset of near-syncope while attempting to climb stairs. Initial encounter.  EXAM: CHEST  2 VIEW  COMPARISON:  Chest radiograph performed 01/19/2014  FINDINGS: The lungs are well-aerated. Vascular congestion is noted, with minimal bilateral atelectasis. No pleural effusion or pneumothorax is identified.  The heart is borderline normal in size. No acute osseous abnormalities are seen.  IMPRESSION: Vascular congestion, with minimal bilateral atelectasis. Lungs otherwise grossly clear. No displaced rib fractures identified.   Electronically Signed   By: Garald Balding M.D.   On:  02/08/2015 01:45   EKG: Independently reviewed. sinus tachycardia.  Assessment/Plan Principal Problem:   AKI (acute kidney injury) Active Problems:   HYPERTENSION, BENIGN   Obesity hypoventilation syndrome   Abnormality of gait   Hyperthyroidism   Type 2 diabetes mellitus   1. AKI (acute kidney injury) The patient is presenting with complaints of generalized fatigue and weakness. Workup shows that she has UTI with hyponatremia as well as hyperkalemia and acute kidney injury. Possibility of poor oral intake and prerenal etiology is most likely. We will get ultrasound renal. Recheck labs. Holding metformin and ACE inhibitor as well as oxybutynin. Patient is receiving gentle IV hydration. We'll monitor in and out.  2.Generalized weakness. Patient complains of generalized weakness which appears most likely secondary to UTI. We will check BNP and TSH level.  3. UTI. Treating her with Cipro. Obtaining culture.  4. hyperthyroidism. Checking TSH, continue PTU  5. diabetes mellitus. Checking hemoglobin A1c. Holding oral hypoglycemic agent. Placing her on sliding scale. Reducing her Lantus dose to 15units twice a day.    Advance goals of care discussion: full code  DVT Prophylaxis: subcutaneous Heparin Nutrition: Diabetic and renal diet   Disposition: Admitted as inpatient, telemetry unit.  Author: Berle Mull, MD [facility] Pager: [phone] 02/08/2015  If 7PM-7AM, please contact night-coverage [email or web address] Password [email or web address]

## 2015-02-08 NOTE — Progress Notes (Signed)
Patient seen and evaluated earlier the same by my associate. Please refer to his H&P for details regarding assessment and plan.  Physical exam Gen. patient in no acute distress, alert and awake Cardiovascular: S1 and S2 within normal limits, no rubs Pulmonary:, No increased work of breathing, no rales  Will plan on reassessing next a.m. unless there is an acute medical condition require reevaluation.  Jalan Bodi, [employer]

## 2015-02-08 NOTE — Progress Notes (Signed)
Initial Nutrition Assessment  DOCUMENTATION CODES:  Obesity unspecified  INTERVENTION:  Snacks, Encourage PO  NUTRITION DIAGNOSIS:  Predicted suboptimal nutrient intake related to social / environmental circumstances as evidenced by meal completion < 50%, per patient/family report.  GOAL:  Patient will meet greater than or equal to 90% of their needs  MONITOR:  PO intake, Labs, Weight trends, I & O's  REASON FOR ASSESSMENT:  Malnutrition Screening Tool    ASSESSMENT: Pt is a 64 y.o. female with Past medical history of hypertension, CVA, diabetes mellitus type 2, dyslipidemia, hyperthyroidism, obesity. Patient presents with complaints of generalized fatigue.  Pt states her appetite has been fine and she eats well at home, 3 meals and couple snacks through the day. Diet recall shows low intake of fruits and vegetables and high intakes of poor quality snacks (chips cookies), however pt appears disinterested in any dietary suggestions or attempts to educate.  Per pt, her usual weight is 265 Lb, and she has been loosing weight gradually over the year. Weight history shows 32 Lb weight loss (12% in 1 yr, not significant for time frame). Per nursing notes pt only ate 25% of her breakfast, pt states "food was terrible" is the reason for low intake. Pt is hoping lunch will be better, ordered pot roast. Pt denies any need for supplements, snacks have been ordered. Will continue to monitor.  Labs reviewed: Na 131, K 5.2, Glu 159, BUN 25, Cr 2.15   Height:  Ht Readings from Last 1 Encounters:  02/08/15 '5\' 4"'$  (1.626 m)    Weight:  Wt Readings from Last 1 Encounters:  02/08/15 233 lb 14.4 oz (106.096 kg)    Ideal Body Weight:  54.5 kg  Wt Readings from Last 10 Encounters:  02/08/15 233 lb 14.4 oz (106.096 kg)  01/21/14 258 lb 2.6 oz (117.103 kg)  01/15/14 265 lb (120.203 kg)  10/09/13 269 lb (122.018 kg)  07/20/13 265 lb (120.203 kg)  07/16/13 265 lb (120.203 kg)  07/14/13  267 lb 8 oz (121.337 kg)  06/20/13 266 lb (120.657 kg)  06/28/09 241 lb (109.317 kg)    BMI:  Body mass index is 40.13 kg/(m^2).  Estimated Nutritional Needs:  Kcal:  1550 - 1750  Protein:  80 - 90 g  Fluid:  per MD  Skin:  Reviewed, no issues  Diet Order:  Diet renal/carb modified with fluid restriction Diet-HS Snack?: Nothing; Room service appropriate?: Yes; Fluid consistency:: Thin  EDUCATION NEEDS:  Education needs no appropriate at this time   Intake/Output Summary (Last 24 hours) at 02/08/15 1236 Last data filed at 02/08/15 1036  Gross per 24 hour  Intake 1178.75 ml  Output     10 ml  Net 1168.75 ml    Last BM:  PTA  Concepcion Kirkpatrick A. [place] Dietetic Intern Pager: [phone] 02/08/2015 12:36 PM

## 2015-02-08 NOTE — ED Notes (Signed)
Family at bedside, updated regarding plan of care.

## 2015-02-08 NOTE — ED Provider Notes (Signed)
CSN: [id number]     Arrival date & time 02/07/15  2345 History  This chart was scribed for Linton Flemings, MD by Mercy Moore, ED scribe.  This patient was seen in room D35C/D35C and the patient's care was started at 12:06 AM.   Chief Complaint  Patient presents with  . Shortness of Breath   The history is provided by the patient. No language interpreter was used.   HPI Comments: Angelica Beck is a 64 y.o. female brought in by ambulance, who presents to the Emergency Department complaining of sudden onset shortness of breath when attempting to ascend stairs tonight. Patient ambulates with walker. She abandoned her walker to climb the stairs for bed. States she turned around and slipped down a single stair. Patient's daughter tried to assist and told her to hold the banister, but she states she was unable to hold on or climb the stairs due to weakness. Patient's daughter then called 911.     Patient states that when returning home this afternoon from a play she was "unable to hold her water." Patient denies additional urinary symptoms, chest pain or fever.     Past Medical History  Diagnosis Date  . Hypertension   . Stroke   . Arthritis   . Diabetes mellitus, type 2   . Hyperlipidemia   . Hyperthyroidism   . Obesity hypoventilation syndrome 06/20/2013   Past Surgical History  Procedure Laterality Date  . Ventral hernia repair     Family History  Problem Relation Age of Onset  . Diabetes Mother    History  Substance Use Topics  . Smoking status: Former Smoker -- 0.25 packs/day for 30 years    Types: Cigarettes    Quit date: 09/25/2002  . Smokeless tobacco: Never Used  . Alcohol Use: No   OB History    No data available     Review of Systems  Constitutional: Negative for fever and chills.  HENT: Negative for rhinorrhea and sore throat.   Eyes: Negative for visual disturbance.  Respiratory: Negative for cough and shortness of breath.   Cardiovascular: Negative for chest  pain and leg swelling.  Gastrointestinal: Negative for nausea, vomiting, abdominal pain and diarrhea.  Genitourinary: Positive for frequency. Negative for dysuria.  Musculoskeletal: Negative for back pain.  Skin: Negative for rash.  Neurological: Positive for weakness. Negative for dizziness, syncope and headaches.  Hematological: Does not bruise/bleed easily.  Psychiatric/Behavioral: Negative for confusion.      Allergies  Penicillins  Home Medications   Prior to Admission medications   Medication Sig Start Date End Date Taking? Authorizing Provider  aspirin EC 81 MG tablet Take 1 tablet (81 mg total) by mouth daily. 01/21/14   Belkys A Regalado, MD  cholecalciferol (VITAMIN D) 1000 UNITS tablet Take 2,000 Units by mouth daily.    Historical Provider, MD  clopidogrel (PLAVIX) 75 MG tablet Take 1 tablet by mouth daily. 06/05/13   Historical Provider, MD  CRESTOR 40 MG tablet Take 1 tablet by mouth daily. 06/05/13   Historical Provider, MD  enalapril (VASOTEC) 20 MG tablet Take 1 tablet by mouth daily. 06/05/13   Historical Provider, MD  LANTUS SOLOSTAR 100 UNIT/ML SOPN Inject 15-30 Units into the skin 2 (two) times daily. 15 in the morning and 30 units at night 06/05/13   Historical Provider, MD  metFORMIN (GLUCOPHAGE) 500 MG tablet Take 1 tablet by mouth 2 (two) times daily. 06/05/13   Historical Provider, MD  metoprolol succinate (TOPROL-XL) 25 MG 24  hr tablet Take 1 tablet (25 mg total) by mouth daily. 01/21/14   Belkys A Regalado, MD  niacin (NIASPAN) 500 MG CR tablet Take 1 tablet by mouth 2 (two) times daily. 06/05/13   Historical Provider, MD  nitroGLYCERIN (NITROSTAT) 0.4 MG SL tablet Place 1 tablet (0.4 mg total) under the tongue every 5 (five) minutes as needed for chest pain. 01/21/14   Belkys A Regalado, MD  NOVOLOG FLEXPEN 100 UNIT/ML SOPN FlexPen Inject 0-15 Units into the skin 3 (three) times daily with meals. Sliding scale 06/05/13   Historical Provider, MD  oxybutynin (DITROPAN  XL) 15 MG 24 hr tablet Take 1 tablet by mouth daily. 06/05/13   Historical Provider, MD  potassium chloride SA (K-DUR,KLOR-CON) 20 MEQ tablet Take 1 tablet by mouth daily. 06/05/13   Historical Provider, MD  propylthiouracil (PTU) 50 MG tablet Take 1 tablet by mouth daily. 06/05/13   Historical Provider, MD  ranitidine (ZANTAC) 150 MG tablet Take 1 tablet by mouth daily. 06/05/13   Historical Provider, MD  traZODone (DESYREL) 50 MG tablet Take 1 tablet by mouth at bedtime. 06/05/13   Historical Provider, MD   Triage Vitals: BP 106/56 mmHg  Pulse 127  Temp(Src) 98.8 F (37.1 C) (Oral)  Resp 20  SpO2 97% Physical Exam  Constitutional: She is oriented to person, place, and time. She appears well-developed and well-nourished. No distress.  HENT:  Head: Normocephalic and atraumatic.  Nose: Nose normal.  Mouth/Throat: Oropharynx is clear and moist.  Eyes: Conjunctivae and EOM are normal. Pupils are equal, round, and reactive to light.  Neck: Normal range of motion. Neck supple. No JVD present. No tracheal deviation present. No thyromegaly present.  Cardiovascular: Regular rhythm, normal heart sounds and intact distal pulses.  Exam reveals no gallop and no friction rub.   No murmur heard. Tachycardia, 2/6 murmur  Pulmonary/Chest: Effort normal and breath sounds normal. No stridor. No respiratory distress. She has no wheezes. She has no rales. She exhibits no tenderness.  Abdominal: Soft. Bowel sounds are normal. She exhibits no distension and no mass. There is no tenderness. There is no rebound and no guarding.  Musculoskeletal: Normal range of motion. She exhibits no edema or tenderness.  Lymphadenopathy:    She has no cervical adenopathy.  Neurological: She is alert and oriented to person, place, and time. She displays normal reflexes. She exhibits normal muscle tone. Coordination normal.  Skin: Skin is warm and dry. No rash noted. She is not diaphoretic. No erythema. No pallor.  Psychiatric: She  has a normal mood and affect. Her behavior is normal. Judgment and thought content normal.  Nursing note and vitals reviewed.   ED Course  Procedures (including critical care time)'  COORDINATION OF CARE: 12:18 AM- Discussed treatment plan with patient at bedside and patient agreed to plan.   Labs Review Labs Reviewed  BASIC METABOLIC PANEL  CBC  I-STAT [place], ED    Imaging Review No results found.   EKG Interpretation   Date/Time:  Sunday Feb 07 2015 23:59:04 EDT Ventricular Rate:  129 PR Interval:  167 QRS Duration: 87 QT Interval:  283 QTC Calculation: 414 R Axis:   -3 Text Interpretation:  Sinus tachycardia Since last tracing rate faster  Confirmed by Leianne Callins  MD, Wyndell Cardiff ([id number]) on 02/08/2015 12:04:02 AM      MDM   Final diagnoses:  UTI (lower urinary tract infection)  Acute kidney injury  Weakness  Hyponatremia   64 year old female with acute onset of generalized weakness  when walking up some flatus stairs, associate with shortness of breath.  She arrives tachycardic.  She reports shortness of breath has resolved.  No recent illnesses, no swelling of the legs.  Patient has history of coronary disease, cerebral vascular disease, diabetes, hypertension.  EKG with sinus tachycardia.  Patient does not clinically appear dehydrated.  Plan for CT angiogram of the chest to further evaluate dyspnea and tachycardia.  Unable to obtain CTangio due to acute kidney injury.  Tachycardia most likely due to infection, dehydration.  Plan for admission.  I personally performed the services described in this documentation, which was scribed in my presence. The recorded information has been reviewed and is accurate.     Linton Flemings, MD 02/08/15 [phone]

## 2015-02-08 NOTE — ED Notes (Signed)
Daughter Angelica Beck headed home for the night, requesting that nursing staff remind pt to call her daughter.

## 2015-02-08 NOTE — ED Notes (Signed)
Pt reports month is February and unable to report year. Alert to self and situation.

## 2015-02-09 LAB — GLUCOSE, CAPILLARY
GLUCOSE-CAPILLARY: 143 mg/dL — AB (ref 65–99)
GLUCOSE-CAPILLARY: 133 mg/dL — AB (ref 65–99)
Glucose-Capillary: 111 mg/dL — ABNORMAL HIGH (ref 65–99)
Glucose-Capillary: 162 mg/dL — ABNORMAL HIGH (ref 65–99)

## 2015-02-09 LAB — HEMOGLOBIN A1C
HEMOGLOBIN A1C: 8.6 % — AB (ref 4.8–5.6)
MEAN PLASMA GLUCOSE: 200 mg/dL

## 2015-02-09 MED ORDER — CIPROFLOXACIN HCL 500 MG PO TABS
500.0000 mg | ORAL_TABLET | Freq: Every day | ORAL | Status: DC
  Administered 2015-02-09 – 2015-02-10 (×2): 500 mg via ORAL
  Filled 2015-02-09 (×2): qty 1

## 2015-02-09 NOTE — Care Management Note (Signed)
Case Management Note  Patient Details  Name: Angelica Beck MRN: [id number] Date of Birth: June 20, 1951  Subjective/Objective:    Pt admitted on 02/07/15 with AKI, fall at home.  PTA, pt resides at home with daughter.  She has paid caregiver with her 10am-5pm daily while daughter works.  Caregiver at bedside; states pt confused and incontinent at times--this is baseline for her.                  Action/Plan:  PT recommending HH follow up at dc.  Has 24h supervision at home.  Will cont to follow for discharge planning.   Expected Discharge Date:                  Expected Discharge Plan:  [place]  In-House Referral:     Discharge planning Services  CM Consult  Post Acute Care Choice:    Choice offered to:     DME Arranged:    DME Agency:     HH Arranged:    [place] Agency:     Status of Service:  In process, will continue to follow  Medicare Important Message Given:    Date Medicare IM Given:    Medicare IM give by:    Date Additional Medicare IM Given:    Additional Medicare Important Message give by:     If discussed at [place] of Stay Meetings, dates discussed:    Additional Comments:  Ella Bodo, RN 02/09/2015, 4:26 PM Phone [phone]

## 2015-02-09 NOTE — Evaluation (Signed)
Occupational Therapy Evaluation Patient Details Name: Angelica Beck MRN: [id number] DOB: 02/23/1951 Today's Date: 02/09/2015    History of Present Illness Pt is a 64 y.o. female with a PMH of HTN, CVA, DMII, dyslipidemia, hyperthyroidism, obesity. She presented with generalized fatigue and inability to climb her stairs and had to urgently sit down. EMS was called as she was unable to stand back up.   Clinical Impression   Pt admitted with above. Aide assisting with ADLs, PTA. Feel pt will benefit from acute OT to increase independence prior to d/c as well as increase strength and activity tolerance. Recommending HHOT upon d/c.     Follow Up Recommendations  Home health OT;Supervision/Assistance - 24 hour    Equipment Recommendations  None recommended by OT    Recommendations for Other Services       Precautions / Restrictions Precautions Precautions: Fall Restrictions Weight Bearing Restrictions: No      Mobility Bed Mobility Overal bed mobility: Needs Assistance Bed Mobility: Supine to Sit     Supine to sit: Supervision     General bed mobility comments: pt taking increased time. No physical assist needed. Cues for technique.  Transfers Overall transfer level: Needs assistance   Transfers: Sit to/from Stand Sit to Stand: Min assist         General transfer comment: assist to boost up to standing position.    Balance  Min guard for ambulation with RW. Balance not formally assessed in session.                                          ADL Overall ADL's : Needs assistance/impaired     Grooming: Set up;Sitting               Lower Body Dressing: Moderate assistance;Sit to/from stand   Toilet Transfer: Min guard;Ambulation;RW (bed to chair)           Functional mobility during ADLs: Min guard;Rolling walker General ADL Comments: Explained that there is AE that can assist with dressing. Assist to don right sock.     Vision      Perception     Praxis      Pertinent Vitals/Pain Pain Assessment: No/denies pain     Hand Dominance Right   Extremity/Trunk Assessment Upper Extremity Assessment Upper Extremity Assessment: Generalized weakness   Lower Extremity Assessment Lower Extremity Assessment: Defer to PT evaluation       Communication Communication Communication: No difficulties   Cognition Arousal/Alertness: Awake/alert Behavior During Therapy: Flat affect Overall Cognitive Status: Within Functional Limits for tasks assessed                     General Comments       Exercises       Shoulder Instructions      Home Living Family/patient expects to be discharged to:: Private residence Living Arrangements: Children Available Help at Discharge: Family;Available PRN/intermittently;Personal care attendant (5 days/week 10-5; pt reported to OT someone there 24/7?) Type of Home: House Home Access: Level entry     Home Layout: Two level Alternate Level Stairs-Number of Steps: 1 flight Alternate Level Stairs-Rails: Left Bathroom Shower/Tub: Tub/shower unit         Home Equipment: Walker - 2 wheels;Tub bench;Electric scooter;Bedside commode   Additional Comments: pt reported to OT that she has someone with her 24/7  Prior Functioning/Environment Level of Independence: Needs assistance  Gait / Transfers Assistance Needed: Uses the walker and the scooter often.  ADL's / Homemaking Assistance Needed: Aide assists with bathing and dressing and tub transfer        OT Diagnosis: Generalized weakness   OT Problem List: Decreased knowledge of use of DME or AE;Decreased knowledge of precautions;Decreased range of motion;Obesity;Decreased strength;Decreased activity tolerance;Pain   OT Treatment/Interventions: Self-care/ADL training;DME and/or AE instruction;Therapeutic activities;Patient/family education;Balance training;Cognitive remediation/compensation;Therapeutic  exercise;Energy conservation    OT Goals(Current goals can be found in the care plan section) Acute Rehab OT Goals Patient Stated Goal: not stated OT Goal Formulation: With patient Time For Goal Achievement: 02/16/15 Potential to Achieve Goals: Good ADL Goals Pt Will Perform Grooming: with set-up;standing (3 tasks) Pt Will Perform Lower Body Bathing: with supervision;with set-up;with adaptive equipment;sit to/from stand Pt Will Perform Lower Body Dressing: with set-up;with supervision;with adaptive equipment;sit to/from stand Pt Will Transfer to Toilet: with supervision;ambulating;bedside commode Pt Will Perform Toileting - Clothing Manipulation and hygiene: sit to/from stand;with modified independence  OT Frequency: Min 2X/week   Barriers to D/C:            Co-evaluation              End of Session Equipment Utilized During Treatment: Gait belt;Rolling walker  Activity Tolerance: Patient limited by fatigue (per pt report; question if it was self-limiting) Patient left: in chair;with call bell/phone within reach;with chair alarm set   Time: 7939-0300 OT Time Calculation (min): 13 min Charges:  OT General Charges $OT Visit: 1 Procedure OT Evaluation $Initial OT Evaluation Tier I: 1 Procedure G-CodesBenito Mccreedy OTR/L [id number] 02/09/2015, 4:52 PM

## 2015-02-09 NOTE — Evaluation (Signed)
Physical Therapy Evaluation Patient Details Name: Angelica Beck MRN: [id number] DOB: 10-04-1950 Today's Date: 02/09/2015   History of Present Illness  Pt is a 64 y/o female with a PMH of HTN, CVA, DMII, dyslipidemia, hyperthyroidism, obesity. She presents with generalized fatigue and inability to climb her stairs and had to urgently sit down. EMS was called as she was unable to stand back up.  Clinical Impression  Pt admitted with above diagnosis. Pt currently with functional limitations due to the deficits listed below (see PT Problem List). At the time of PT eval pt was able to perform transfers and ambulation with min assist and increased time. Pt moves very slowly. Pt will benefit from skilled PT to increase their independence and safety with mobility to allow discharge to the venue listed below. Pt agreeable to PT follow-up at d/c however prefers to return home vs. SNF. Pt has [facility] aides coming in [place] and feel pt could manage at home, however she will need to arrange for main level living until she can negotiate the stairs again. Will plan for HHPT at this time, however if pt does not make functional gains or cannot work out main level living, may need to consider SNF.     Follow Up Recommendations Home health PT;Supervision/Assistance - 24 hour    Equipment Recommendations  None recommended by PT    Recommendations for Other Services       Precautions / Restrictions Precautions Precautions: Fall Restrictions Weight Bearing Restrictions: No      Mobility  Bed Mobility Overal bed mobility: Needs Assistance Bed Mobility: Supine to Sit     Supine to sit: Min guard     General bed mobility comments: Pt insisted on getting to EOB on her own without therapist assist. Pt moving very slowly and was able to achieve EOB with feet on floor in 6 minutes. Min guard for trunk stability.   Transfers Overall transfer level: Needs assistance Equipment used: Rolling walker (2  wheeled) Transfers: Sit to/from Stand Sit to Stand: Min assist         General transfer comment: Assist to power-up to full standing position.   Ambulation/Gait Ambulation/Gait assistance: Min assist Ambulation Distance (Feet): 15 Feet Assistive device: Rolling walker (2 wheeled) Gait Pattern/deviations: Step-through pattern;Decreased stride length;Wide base of support Gait velocity: Decreased Gait velocity interpretation: Below normal speed for age/gender General Gait Details: Pt required assist with walker positioning as she turned around to the recliner chair. VC's to hold walker closer to her.   Stairs            Wheelchair Mobility    Modified Rankin (Stroke Patients Only)       Balance Overall balance assessment: Needs assistance Sitting-balance support: Feet supported;No upper extremity supported Sitting balance-Leahy Scale: Fair     Standing balance support: Bilateral upper extremity supported Standing balance-Leahy Scale: Poor Standing balance comment: Pt requires UE support to maintain standing balance at this time.                              Pertinent Vitals/Pain Pain Assessment: Faces Faces Pain Scale: Hurts a little bit Pain Location: B legs Pain Descriptors / Indicators:  ("hurting") Pain Intervention(s): Limited activity within patient's tolerance;Monitored during session;Repositioned    Home Living Family/patient expects to be discharged to:: Private residence Living Arrangements: Children Available Help at Discharge: Family;Available PRN/intermittently;Personal care attendant (5 days/week 10-5) Type of Home: House Home Access: Level entry  Home Layout: Two level Home Equipment: Walker - 2 wheels;Tub bench;Electric scooter      Prior Function Level of Independence: Needs assistance   Gait / Transfers Assistance Needed: Uses the walker and the scooter often.   ADL's / Homemaking Assistance Needed: Aide assists with  bathing and dressing        Hand Dominance   Dominant Hand: Right    Extremity/Trunk Assessment   Upper Extremity Assessment: Defer to OT evaluation;Generalized weakness           Lower Extremity Assessment: Generalized weakness      Cervical / Trunk Assessment: Normal  Communication   Communication: No difficulties  Cognition Arousal/Alertness: Lethargic Behavior During Therapy: Flat affect Overall Cognitive Status: Within Functional Limits for tasks assessed                      General Comments      Exercises        Assessment/Plan    PT Assessment Patient needs continued PT services  PT Diagnosis Difficulty walking;Generalized weakness   PT Problem List Decreased strength;Decreased range of motion;Decreased activity tolerance;Decreased balance;Decreased mobility;Decreased knowledge of use of DME;Decreased safety awareness;Decreased knowledge of precautions  PT Treatment Interventions DME instruction;Gait training;Stair training;Functional mobility training;Therapeutic activities;Therapeutic exercise;Neuromuscular re-education;Patient/family education   PT Goals (Current goals can be found in the Care Plan section) Acute Rehab PT Goals Patient Stated Goal: Pt did not state goals during session.  PT Goal Formulation: With patient Time For Goal Achievement: 02/16/15 Potential to Achieve Goals: Good    Frequency Min 3X/week   Barriers to discharge        Co-evaluation               End of Session Equipment Utilized During Treatment: Gait belt Activity Tolerance: Patient limited by lethargy;Patient limited by fatigue Patient left: in chair;with call bell/phone within reach;with family/visitor present Nurse Communication: Mobility status         Time: 1025-1101 PT Time Calculation (min) (ACUTE ONLY): 36 min   Charges:   PT Evaluation $Initial PT Evaluation Tier I: 1 Procedure PT Treatments $Therapeutic Activity: 8-22 mins   PT  G Codes:        Rolinda Roan 25-Feb-2015, 1:47 PM   Rolinda Roan, PT, DPT Acute Rehabilitation Services Pager: [phone]

## 2015-02-09 NOTE — Progress Notes (Signed)
[facility] PROGRESS NOTE  Gretchen Weinfeld NWG:[id number] DOB: 1950-12-25 DOA: 02/07/2015 PCP: Antonietta Jewel, MD  Assessment/Plan:  Principal Problem: Generalized weakness - Could be due to multiple etiologies including dehydration and urinary tract infection. Patient reported improvement. We'll continue current medical management. -Obtain physical therapy evaluation    AKI (acute kidney injury) -Improving with gentle fluid hydration. We'll continue gentle fluid hydration and reassess serum creatinine levels next a.m. - Renal ultrasound reported no hydronephrosis and left renal cyst  Hyperkalemia - Currently trending down we'll continue IV fluids and reassess next a.m.  Urinary tract infection -We'll plan on continuing Cipro - Follow-up with urine culture  Active Problems:   HYPERTENSION, BENIGN - Relatively well controlled on metoprolol. Will plan on continuing this regimen    Obesity hypoventilation syndrome   Abnormality of gait   Hyperthyroidism - Continue propylthiouracil, stable    Type 2 diabetes mellitus - Continue carb modified diet and sliding scale insulin  Code Status: Full Family Communication: Discussed with patient Disposition Plan: Pending recommendations from physical therapy. With continued improvement may consider discharging with the next day or 2   Consultants:  None  Procedures:  None  Antibiotics:  Cipro  HPI/Subjective: The patient has no new complaints. No acute issues reported overnight  Objective: Filed Vitals:   02/09/15 1354  BP: 136/67  Pulse: 80  Temp: 98.6 F (37 C)  Resp: 16    Intake/Output Summary (Last 24 hours) at 02/09/15 1614 Last data filed at 02/09/15 1456  Gross per 24 hour  Intake 3378.25 ml  Output    400 ml  Net 2978.25 ml   Filed Weights   02/08/15 0400 02/09/15 0602  Weight: 233 lb 14.4 oz (106.096 kg) 237 lb 6.4 oz (107.684 kg)    Exam:   General:  Patient in no acute distress, alert and  awake  Cardiovascular: Regular rate and rhythm, no murmurs or rubs  Respiratory: No increased work of breathing, speaking in full sentences, no audible wheezes  Abdomen: Soft, nondistended, no guarding  Musculoskeletal: No cyanosis on limited exam, equal muscle tone   Data Reviewed: Basic Metabolic Panel:  Recent Labs Lab 02/08/15 02/08/15 0522  NA 129* 131*  K 5.9* 5.2*  CL 100* 104  CO2 18* 21*  GLUCOSE 195* 127*  BUN 28* 25*  CREATININE 2.45* 2.15*  CALCIUM 9.5 8.9  MG  --  1.4*  PHOS  --  3.7   Liver Function Tests:  Recent Labs Lab 02/08/15 0522  AST 16  ALT 8*  ALKPHOS 60  BILITOT 0.4  PROT 6.7  ALBUMIN 2.9*   No results for input(s): LIPASE, AMYLASE in the last 168 hours. No results for input(s): AMMONIA in the last 168 hours. CBC:  Recent Labs Lab 02/08/15 02/08/15 0522  WBC 12.4* 9.4  NEUTROABS  --  7.0  HGB 10.3* 9.1*  HCT 32.2* 29.0*  MCV 77.4* 77.1*  PLT 262 217   Cardiac Enzymes: No results for input(s): CKTOTAL, CKMB, CKMBINDEX, TROPONINI in the last 168 hours. BNP (last 3 results)  Recent Labs  02/08/15 0522  BNP 11.0    ProBNP (last 3 results) No results for input(s): PROBNP in the last 8760 hours.  CBG:  Recent Labs Lab 02/08/15 1102 02/08/15 1611 02/08/15 2128 02/09/15 0637 02/09/15 1101  GLUCAP 159* 181* 164* 111* 143*    No results found for this or any previous visit (from the past 240 hour(s)).   Studies: Dg Chest 2 View (if Patient Has Fever And/or  Copd)  02/08/2015   CLINICAL DATA:  Acute onset of near-syncope while attempting to climb stairs. Initial encounter.  EXAM: CHEST  2 VIEW  COMPARISON:  Chest radiograph performed 01/19/2014  FINDINGS: The lungs are well-aerated. Vascular congestion is noted, with minimal bilateral atelectasis. No pleural effusion or pneumothorax is identified.  The heart is borderline normal in size. No acute osseous abnormalities are seen.  IMPRESSION: Vascular congestion, with  minimal bilateral atelectasis. Lungs otherwise grossly clear. No displaced rib fractures identified.   Electronically Signed   By: Garald Balding M.D.   On: 02/08/2015 01:45   US Renal  02/08/2015   CLINICAL DATA:  Acute renal insufficiency  EXAM: RENAL / URINARY TRACT ULTRASOUND COMPLETE  COMPARISON:  CT 06/24/2010  FINDINGS: Right Kidney:  Length: 12.3 cm. Echogenicity within normal limits. No mass or hydronephrosis visualized.  Left Kidney:  Length: 12.0 cm. 2.2 x 1.4 cm cyst in the lower pole, present on prior exam. Echogenicity within normal limits. No solid mass or hydronephrosis visualized.  Bladder:  Appears normal for degree of bladder distention.  IMPRESSION: 1. No hydronephrosis. 2. Stable left renal cyst.   Electronically Signed   By: Lucrezia Europe M.D.   On: 02/08/2015 08:01    Scheduled Meds: . aspirin EC  81 mg Oral Daily  . ciprofloxacin  500 mg Oral QHS  . clopidogrel  75 mg Oral Daily  . famotidine  20 mg Oral Daily  . feeding supplement (NEPRO CARB STEADY)  237 mL Oral BID BM  . heparin  5,000 Units Subcutaneous 3 times per day  . insulin aspart  0-15 Units Subcutaneous TID WC  . insulin aspart  0-5 Units Subcutaneous QHS  . insulin glargine  15 Units Subcutaneous BID  . metoprolol succinate  50 mg Oral Daily  . propylthiouracil  50 mg Oral Daily  . rosuvastatin  40 mg Oral Daily  . sodium chloride  3 mL Intravenous Q12H   Continuous Infusions: . sodium chloride 75 mL/hr at 02/09/15 1023    Time spent: > 35 minutes    Velvet Bathe  [facility] Pager [phone]. If 7PM-7AM, please contact night-coverage at [email or web address], password [facility] 02/09/2015, 4:14 PM  LOS: 1 day

## 2015-02-10 DIAGNOSIS — E0590 Thyrotoxicosis, unspecified without thyrotoxic crisis or storm: Secondary | ICD-10-CM

## 2015-02-10 DIAGNOSIS — R269 Unspecified abnormalities of gait and mobility: Secondary | ICD-10-CM

## 2015-02-10 DIAGNOSIS — N179 Acute kidney failure, unspecified: Principal | ICD-10-CM

## 2015-02-10 DIAGNOSIS — I10 Essential (primary) hypertension: Secondary | ICD-10-CM

## 2015-02-10 DIAGNOSIS — E1121 Type 2 diabetes mellitus with diabetic nephropathy: Secondary | ICD-10-CM

## 2015-02-10 DIAGNOSIS — E662 Morbid (severe) obesity with alveolar hypoventilation: Secondary | ICD-10-CM

## 2015-02-10 LAB — BASIC METABOLIC PANEL
Anion gap: 8 (ref 5–15)
BUN: 8 mg/dL (ref 6–20)
CALCIUM: 8.6 mg/dL — AB (ref 8.9–10.3)
CO2: 20 mmol/L — ABNORMAL LOW (ref 22–32)
Chloride: 106 mmol/L (ref 101–111)
Creatinine, Ser: 1.56 mg/dL — ABNORMAL HIGH (ref 0.44–1.00)
GFR calc Af Amer: 40 mL/min — ABNORMAL LOW (ref >60)
GFR, EST NON AFRICAN AMERICAN: 35 mL/min — AB (ref >60)
GLUCOSE: 235 mg/dL — AB (ref 65–99)
POTASSIUM: 3.8 mmol/L (ref 3.5–5.1)
Sodium: 134 mmol/L — ABNORMAL LOW (ref 135–145)

## 2015-02-10 LAB — GLUCOSE, CAPILLARY
GLUCOSE-CAPILLARY: 92 mg/dL (ref 65–99)
Glucose-Capillary: 144 mg/dL — ABNORMAL HIGH (ref 65–99)
Glucose-Capillary: 193 mg/dL — ABNORMAL HIGH (ref 65–99)

## 2015-02-10 MED ORDER — CIPROFLOXACIN HCL 500 MG PO TABS: 500.0000 mg | ORAL_TABLET | Freq: Every day | ORAL | Status: DC

## 2015-02-10 MED ORDER — NEPRO/CARBSTEADY PO LIQD: 237.0000 mL | Freq: Two times a day (BID) | ORAL | Status: DC

## 2015-02-10 NOTE — Discharge Instructions (Signed)
Acute Kidney Injury Acute kidney injury is a disease in which there is sudden (acute) damage to the kidneys. The kidneys are 2 organs that lie on either side of the spine between the middle of the back and the front of the abdomen. The kidneys:  Remove wastes and extra water from the blood.   Produce important hormones. These help keep bones strong, regulate blood pressure, and help create red blood cells.   Balance the fluids and chemicals in the blood and tissues. A small amount of kidney damage may not cause problems, but a large amount of damage may make it difficult or impossible for the kidneys to work the way they should. Acute kidney injury may develop into long-lasting (chronic) kidney disease. It may also develop into a life-threatening disease called end-stage kidney disease. Acute kidney injury can get worse very quickly, so it should be treated right away. Early treatment may prevent other kidney diseases from developing.  CAUSES   A problem with blood flow to the kidneys. This may be caused by:   Blood loss.   Heart disease.   Severe burns.   Liver disease.  Direct damage to the kidneys. This may be caused by:  Some medicines.   A kidney infection.   Poisoning or consuming toxic substances.   A surgical wound.   A blow to the kidney area.   A problem with urine flow. This may be caused by:   Cancer.   Kidney stones.   An enlarged prostate. SYMPTOMS   Swelling (edema) of the legs, ankles, or feet.   Tiredness (lethargy).   Nausea or vomiting.   Confusion.   Problems with urination, such as:   Painful or burning feeling during urination.   Decreased urine production.   Frequent accidents in children who are potty trained.   Bloody urine.   Muscle twitches and cramps.   Shortness of breath.   Seizures.   Chest pain or pressure. Sometimes, no symptoms are present. DIAGNOSIS Acute kidney injury may be detected  and diagnosed by tests, including blood, urine, imaging, or kidney biopsy tests.  TREATMENT Treatment of acute kidney injury varies depending on the cause and severity of the kidney damage. In mild cases, no treatment may be needed. The kidneys may heal on their own. If acute kidney injury is more severe, your caregiver will treat the cause of the kidney damage, help the kidneys heal, and prevent complications from occurring. Severe cases may require a procedure to remove toxic wastes from the body (dialysis) or surgery to repair kidney damage. Surgery may involve:   Repair of a torn kidney.   Removal of an obstruction. Most of the time, you will need to stay overnight at the hospital.  HOME CARE INSTRUCTIONS:  Follow your prescribed diet.  Only take over-the-counter or prescription medicines as directed by your caregiver.  Do not take any new medicines (prescription, over-the-counter, or nutritional supplements) unless approved by your caregiver. Many medicines can worsen your kidney damage or need to have the dose adjusted.   Keep all follow-up appointments as directed by your caregiver.  Observe your condition to make sure you are healing as expected. SEEK IMMEDIATE MEDICAL CARE IF:  You are feeling ill or have severe pain in the back or side.   Your symptoms return or you have new symptoms.  You have any symptoms of end-stage kidney disease. These include:   Persistent itchiness.   Loss of appetite.   Headaches.   Abnormally dark   or light skin.  Numbness in the hands or feet.   Easy bruising.   Frequent hiccups.   Menstruation stops.   You have a fever.  You have increased urine production.  You have pain or bleeding when urinating. MAKE SURE YOU:   Understand these instructions.  Will watch your condition.  Will get help right away if you are not doing well or get worse Document Released: 03/27/2011 Document Revised: 01/06/2013 Document  Reviewed: 05/10/2012 ExitCare Patient Information 2015 [employer]. This information is not intended to replace advice given to you by your health care provider. Make sure you discuss any questions you have with your health care provider.  

## 2015-02-10 NOTE — Discharge Summary (Signed)
Physician Discharge Summary  Angelica Beck VFI:[id number] DOB: 07/15/1951 DOA: 02/07/2015  PCP: Angelica Jewel, MD  Admit date: 02/07/2015 Discharge date: 02/10/2015  Time spent: 45 minutes  Recommendations for Outpatient Follow-up:  Patient will be discharged to home with home health, physical and occupational therapy.  Patient will need to follow up with primary care provider within one week of discharge and have repeat BMP.  Patient should continue medications as prescribed.  Patient should follow a Heart healthy/carb modified diet.   Discharge Diagnoses:  Principal Problem:   AKI (acute kidney injury) Active Problems:   HYPERTENSION, BENIGN   Obesity hypoventilation syndrome   Abnormality of gait   Hyperthyroidism   Type 2 diabetes mellitus  Discharge Condition: stable  Diet recommendation: Heart healthy/carb modified  Filed Weights   02/08/15 0400 02/09/15 0602 02/10/15 0500  Weight: 106.096 kg (233 lb 14.4 oz) 107.684 kg (237 lb 6.4 oz) 108.682 kg (239 lb 9.6 oz)    History of present illness:  On 02/08/2015 by Dr. Berle Mull Angelica Beck is a 64 y.o. female with Past medical history of hypertension, CVA, diabetes mellitus type 2, dyslipidemia, hyperthyroidism, obesity. The patient presents with complaints of generalized fatigue. She mentions that she was trying to climb the stairs of and when she was holding the railing and trying to turn he felt that she suddenly turned and lost her balance and had to sit on the stairs. She denies any fall or injury or head injury or neck injury. She was unable to stand up on her own due to fatigue and therefore they called the EMS. The patient denies any complaints of chest pain, abdominal pain, shortness of breath, dizziness, vertigo, focal deficit, diarrhea, vomiting, nausea, poor oral intake. She mentions she has occasional shortness of breath. She mentions a week ago she also had another near fall while she was going to the  restroom. She thinks there is a change in her medications recently but she is not sure which medication. The patient is coming from home. And at her baseline independent for most of her ADL.  Hospital Course:  Generalized weakness -Could be due to multiple etiologies including dehydration and urinary tract infection.  -Patient reported improvement.  -PT and OT consulted and recommended home health  AKI (acute kidney injury) -Improving with gentle fluid hydration. -Renal ultrasound reported no hydronephrosis and left renal cyst -Creatinine currently 1.5 (baseline around 1) -Patient will need repeat BMP in one week  Hyperkalemia -Resolved, Potassium 3.8  Urinary tract infection -Continue Cipro -No urine culture collected  Hypertension -Stable, continue metoprolol  Obesity hypoventilation syndrome  Abnormality of gait -PT recommended HH   Hyperthyroidism -Continue propylthiouracil  Type 2 diabetes mellitus -Continue carb modified diet and sliding scale insulin  Procedures: None  Consultations: None  Discharge Exam: Filed Vitals:   02/10/15 1059  BP: 149/66  Pulse: 68  Temp: 98.6 F (37 C)  Resp: 18    General: Well developed, well nourished, NAD, appears stated age  HEENT: NCAT, mucous membranes moist.  Cardiovascular: S1 S2 auscultated, no rubs, murmurs or gallops. Regular rate and rhythm.  Respiratory: Clear to auscultation bilaterally with equal chest rise  Abdomen: Soft, nontender, nondistended, + bowel sounds  Extremities: warm dry without cyanosis clubbing or edema  Discharge Instructions     Medication List    ASK your doctor about these medications        albuterol 108 (90 BASE) MCG/ACT inhaler  Commonly known as:  PROVENTIL HFA;VENTOLIN HFA  Inhale 2  puffs into the lungs every 6 (six) hours as needed for wheezing or shortness of breath.     amLODipine 10 MG tablet  Commonly known as:  NORVASC  Take 10 mg by mouth daily.      aspirin EC 81 MG tablet  Take 1 tablet (81 mg total) by mouth daily.     baclofen 10 MG tablet  Commonly known as:  LIORESAL  Take 10 mg by mouth 3 (three) times daily as needed for muscle spasms.     clopidogrel 75 MG tablet  Commonly known as:  PLAVIX  Take 75 mg by mouth daily.     diphenhydramine-acetaminophen 25-500 MG Tabs  Commonly known as:  TYLENOL PM  Take 1 tablet by mouth at bedtime as needed (sleep).     enalapril 20 MG tablet  Commonly known as:  VASOTEC  Take 1 tablet by mouth 2 (two) times daily.     gabapentin 100 MG capsule  Commonly known as:  NEURONTIN  Take 100 mg by mouth at bedtime.     insulin aspart 100 UNIT/ML FlexPen  Commonly known as:  NOVOLOG  Inject 12 Units into the skin 3 (three) times daily with meals.     insulin glargine 100 unit/mL Sopn  Commonly known as:  LANTUS  Inject 23 Units into the skin at bedtime.     insulin lispro 100 UNIT/ML KiwkPen  Commonly known as:  HUMALOG  Inject 15 Units into the skin 3 (three) times daily as needed (CBG 200 or more).     meloxicam 15 MG tablet  Commonly known as:  MOBIC  Take 15 mg by mouth daily.     metFORMIN 1000 MG tablet  Commonly known as:  GLUCOPHAGE  Take 1,000 mg by mouth 2 (two) times daily with a meal.     metoprolol succinate 25 MG 24 hr tablet  Commonly known as:  TOPROL-XL  Take 1 tablet (25 mg total) by mouth daily.     metoprolol succinate 50 MG 24 hr tablet  Commonly known as:  TOPROL-XL  Take 50 mg by mouth daily.     niacin 1000 MG CR tablet  Commonly known as:  NIASPAN  Take 1,000 mg by mouth at bedtime.     nitroGLYCERIN 0.4 MG SL tablet  Commonly known as:  NITROSTAT  Place 1 tablet (0.4 mg total) under the tongue every 5 (five) minutes as needed for chest pain.     oxybutynin 10 MG 24 hr tablet  Commonly known as:  DITROPAN-XL  Take 10 mg by mouth daily.     potassium chloride SA 20 MEQ tablet  Commonly known as:  K-DUR,KLOR-CON  Take 20 mEq by mouth daily.      propylthiouracil 50 MG tablet  Commonly known as:  PTU  Take 50 mg by mouth 2 (two) times daily.     ranitidine 150 MG tablet  Commonly known as:  ZANTAC  Take 150 mg by mouth 2 (two) times daily.     rosuvastatin 40 MG tablet  Commonly known as:  CRESTOR  Take 40 mg by mouth at bedtime.     traMADol 50 MG tablet  Commonly known as:  ULTRAM  Take 50 mg by mouth every 6 (six) hours as needed (pain).     traZODone 50 MG tablet  Commonly known as:  DESYREL  Take 50 mg by mouth at bedtime as needed for sleep.     triamterene-hydrochlorothiazide 37.5-25 MG per tablet  Commonly  known as:  MAXZIDE-25  Take 1 tablet by mouth daily.     Vitamin D 2000 UNITS tablet  Take 2,000 Units by mouth daily.       Allergies  Allergen Reactions  . Penicillins Swelling    Facial swelling      The results of significant diagnostics from this hospitalization (including imaging, microbiology, ancillary and laboratory) are listed below for reference.    Significant Diagnostic Studies: Dg Chest 2 View (if Patient Has Fever And/or Copd)  02/08/2015   CLINICAL DATA:  Acute onset of near-syncope while attempting to climb stairs. Initial encounter.  EXAM: CHEST  2 VIEW  COMPARISON:  Chest radiograph performed 01/19/2014  FINDINGS: The lungs are well-aerated. Vascular congestion is noted, with minimal bilateral atelectasis. No pleural effusion or pneumothorax is identified.  The heart is borderline normal in size. No acute osseous abnormalities are seen.  IMPRESSION: Vascular congestion, with minimal bilateral atelectasis. Lungs otherwise grossly clear. No displaced rib fractures identified.   Electronically Signed   By: Garald Balding M.D.   On: 02/08/2015 01:45   US Renal  02/08/2015   CLINICAL DATA:  Acute renal insufficiency  EXAM: RENAL / URINARY TRACT ULTRASOUND COMPLETE  COMPARISON:  CT 06/24/2010  FINDINGS: Right Kidney:  Length: 12.3 cm. Echogenicity within normal limits. No mass or  hydronephrosis visualized.  Left Kidney:  Length: 12.0 cm. 2.2 x 1.4 cm cyst in the lower pole, present on prior exam. Echogenicity within normal limits. No solid mass or hydronephrosis visualized.  Bladder:  Appears normal for degree of bladder distention.  IMPRESSION: 1. No hydronephrosis. 2. Stable left renal cyst.   Electronically Signed   By: Lucrezia Europe M.D.   On: 02/08/2015 08:01    Microbiology: No results found for this or any previous visit (from the past 240 hour(s)).   Labs: Basic Metabolic Panel:  Recent Labs Lab 02/08/15 02/08/15 0522  NA 129* 131*  K 5.9* 5.2*  CL 100* 104  CO2 18* 21*  GLUCOSE 195* 127*  BUN 28* 25*  CREATININE 2.45* 2.15*  CALCIUM 9.5 8.9  MG  --  1.4*  PHOS  --  3.7   Liver Function Tests:  Recent Labs Lab 02/08/15 0522  AST 16  ALT 8*  ALKPHOS 60  BILITOT 0.4  PROT 6.7  ALBUMIN 2.9*   No results for input(s): LIPASE, AMYLASE in the last 168 hours. No results for input(s): AMMONIA in the last 168 hours. CBC:  Recent Labs Lab 02/08/15 02/08/15 0522  WBC 12.4* 9.4  NEUTROABS  --  7.0  HGB 10.3* 9.1*  HCT 32.2* 29.0*  MCV 77.4* 77.1*  PLT 262 217   Cardiac Enzymes: No results for input(s): CKTOTAL, CKMB, CKMBINDEX, TROPONINI in the last 168 hours. BNP: BNP (last 3 results)  Recent Labs  02/08/15 0522  BNP 11.0    ProBNP (last 3 results) No results for input(s): PROBNP in the last 8760 hours.  CBG:  Recent Labs Lab 02/09/15 1101 02/09/15 1610 02/09/15 2218 02/10/15 0632 02/10/15 1130  GLUCAP 143* 133* 162* 92 144*       Signed:  Metha Kolasa  [facility] 02/10/2015, 1:31 PM

## 2015-02-10 NOTE — Care Management Note (Signed)
Case Management Note  Patient Details  Name: Angelica Beck MRN: [id number] Date of Birth: [birth year]  Subjective/Objective:    Pt for dc home today with daughter and caregiver.  She will need HH follow up.  Spoke with daughter, [name], by phone [phone]) to arrange [facility] services.  She also requests hospital bed for home, as pt's bedroom is up 16 stairs and pt fell PTA going upstairs to her room.  Order requested for DME from MD.  Referral to [facility], per daughter's choice.  Start of care 24-48h post dc date.    [facility] to call daughter for bed delivery.             Action/Plan:   Expected Discharge Date:                  Expected Discharge Plan:  [place]  In-House Referral:     Discharge planning Services  CM Consult  Post Acute Care Choice:  Home Health Choice offered to:  Adult Children, HC POA / Guardian  DME Arranged:  Hospital bed DME Agency:  [place] Arranged:  PT, OT [facility] Agency:  [place]  Status of Service:  Completed, signed off  Medicare Important Message Given:  Yes Date Medicare IM Given:  02/10/15 Medicare IM give by:  Ellan Lambert, RN, BSN  Date Additional Medicare IM Given:    Additional Medicare Important Message give by:     If discussed at [place] of Stay Meetings, dates discussed:    Additional Comments:  Ella Bodo, RN 02/10/2015, 2:21 PM Phone [phone]

## 2015-02-10 NOTE — Progress Notes (Signed)
Order received to schedule follow up appointment with PCP (Dr. Sheryle Hail). Patient states she does not see that physician any more. Patient sees Delia Chimes, NP from [place]. Admitting made aware of change as well as Dr. Ree Kida. Follow up scheduled with Delia Chimes, NP for next week.

## 2015-02-10 NOTE — Progress Notes (Signed)
Cardiac monitor discontinued per order. CCMD notified.

## 2015-02-10 NOTE — Progress Notes (Signed)
Discharge instructions discussed with daughter, Marylee Floras, via telephone. Discussed medications, diet, activity, and follow up appointments. Daughter to pick patient up once [facility] delivers [facility] DME.

## 2015-02-12 DIAGNOSIS — E662 Morbid (severe) obesity with alveolar hypoventilation: Secondary | ICD-10-CM | POA: Diagnosis not present

## 2015-02-12 DIAGNOSIS — N189 Chronic kidney disease, unspecified: Secondary | ICD-10-CM | POA: Diagnosis not present

## 2015-02-12 DIAGNOSIS — E1165 Type 2 diabetes mellitus with hyperglycemia: Secondary | ICD-10-CM | POA: Diagnosis not present

## 2015-02-12 DIAGNOSIS — I129 Hypertensive chronic kidney disease with stage 1 through stage 4 chronic kidney disease, or unspecified chronic kidney disease: Secondary | ICD-10-CM | POA: Diagnosis not present

## 2015-02-12 DIAGNOSIS — N390 Urinary tract infection, site not specified: Secondary | ICD-10-CM | POA: Diagnosis not present

## 2015-02-12 DIAGNOSIS — E785 Hyperlipidemia, unspecified: Secondary | ICD-10-CM | POA: Diagnosis not present

## 2015-02-12 DIAGNOSIS — Z794 Long term (current) use of insulin: Secondary | ICD-10-CM | POA: Diagnosis not present

## 2015-02-15 DIAGNOSIS — E1165 Type 2 diabetes mellitus with hyperglycemia: Secondary | ICD-10-CM | POA: Diagnosis not present

## 2015-02-15 DIAGNOSIS — N390 Urinary tract infection, site not specified: Secondary | ICD-10-CM | POA: Diagnosis not present

## 2015-02-15 DIAGNOSIS — E662 Morbid (severe) obesity with alveolar hypoventilation: Secondary | ICD-10-CM | POA: Diagnosis not present

## 2015-02-15 DIAGNOSIS — E785 Hyperlipidemia, unspecified: Secondary | ICD-10-CM | POA: Diagnosis not present

## 2015-02-15 DIAGNOSIS — I129 Hypertensive chronic kidney disease with stage 1 through stage 4 chronic kidney disease, or unspecified chronic kidney disease: Secondary | ICD-10-CM | POA: Diagnosis not present

## 2015-02-15 DIAGNOSIS — N189 Chronic kidney disease, unspecified: Secondary | ICD-10-CM | POA: Diagnosis not present

## 2015-02-16 DIAGNOSIS — E662 Morbid (severe) obesity with alveolar hypoventilation: Secondary | ICD-10-CM | POA: Diagnosis not present

## 2015-02-16 DIAGNOSIS — N390 Urinary tract infection, site not specified: Secondary | ICD-10-CM | POA: Diagnosis not present

## 2015-02-16 DIAGNOSIS — E785 Hyperlipidemia, unspecified: Secondary | ICD-10-CM | POA: Diagnosis not present

## 2015-02-16 DIAGNOSIS — I129 Hypertensive chronic kidney disease with stage 1 through stage 4 chronic kidney disease, or unspecified chronic kidney disease: Secondary | ICD-10-CM | POA: Diagnosis not present

## 2015-02-16 DIAGNOSIS — E1165 Type 2 diabetes mellitus with hyperglycemia: Secondary | ICD-10-CM | POA: Diagnosis not present

## 2015-02-16 DIAGNOSIS — N189 Chronic kidney disease, unspecified: Secondary | ICD-10-CM | POA: Diagnosis not present

## 2015-02-17 DIAGNOSIS — I129 Hypertensive chronic kidney disease with stage 1 through stage 4 chronic kidney disease, or unspecified chronic kidney disease: Secondary | ICD-10-CM | POA: Diagnosis not present

## 2015-02-17 DIAGNOSIS — E662 Morbid (severe) obesity with alveolar hypoventilation: Secondary | ICD-10-CM | POA: Diagnosis not present

## 2015-02-17 DIAGNOSIS — R1110 Vomiting, unspecified: Secondary | ICD-10-CM | POA: Diagnosis not present

## 2015-02-17 DIAGNOSIS — I10 Essential (primary) hypertension: Secondary | ICD-10-CM | POA: Diagnosis not present

## 2015-02-17 DIAGNOSIS — E1165 Type 2 diabetes mellitus with hyperglycemia: Secondary | ICD-10-CM | POA: Diagnosis not present

## 2015-02-17 DIAGNOSIS — I639 Cerebral infarction, unspecified: Secondary | ICD-10-CM | POA: Diagnosis not present

## 2015-02-17 DIAGNOSIS — N390 Urinary tract infection, site not specified: Secondary | ICD-10-CM | POA: Diagnosis not present

## 2015-02-17 DIAGNOSIS — N189 Chronic kidney disease, unspecified: Secondary | ICD-10-CM | POA: Diagnosis not present

## 2015-02-17 DIAGNOSIS — E119 Type 2 diabetes mellitus without complications: Secondary | ICD-10-CM | POA: Diagnosis not present

## 2015-02-17 DIAGNOSIS — E785 Hyperlipidemia, unspecified: Secondary | ICD-10-CM | POA: Diagnosis not present

## 2015-02-17 DIAGNOSIS — R600 Localized edema: Secondary | ICD-10-CM | POA: Diagnosis not present

## 2015-02-17 DIAGNOSIS — N19 Unspecified kidney failure: Secondary | ICD-10-CM | POA: Diagnosis not present

## 2015-02-19 DIAGNOSIS — N390 Urinary tract infection, site not specified: Secondary | ICD-10-CM | POA: Diagnosis not present

## 2015-02-19 DIAGNOSIS — E1165 Type 2 diabetes mellitus with hyperglycemia: Secondary | ICD-10-CM | POA: Diagnosis not present

## 2015-02-19 DIAGNOSIS — E785 Hyperlipidemia, unspecified: Secondary | ICD-10-CM | POA: Diagnosis not present

## 2015-02-19 DIAGNOSIS — N189 Chronic kidney disease, unspecified: Secondary | ICD-10-CM | POA: Diagnosis not present

## 2015-02-19 DIAGNOSIS — E662 Morbid (severe) obesity with alveolar hypoventilation: Secondary | ICD-10-CM | POA: Diagnosis not present

## 2015-02-19 DIAGNOSIS — I129 Hypertensive chronic kidney disease with stage 1 through stage 4 chronic kidney disease, or unspecified chronic kidney disease: Secondary | ICD-10-CM | POA: Diagnosis not present

## 2015-02-22 DIAGNOSIS — E1165 Type 2 diabetes mellitus with hyperglycemia: Secondary | ICD-10-CM | POA: Diagnosis not present

## 2015-02-22 DIAGNOSIS — E662 Morbid (severe) obesity with alveolar hypoventilation: Secondary | ICD-10-CM | POA: Diagnosis not present

## 2015-02-22 DIAGNOSIS — N390 Urinary tract infection, site not specified: Secondary | ICD-10-CM | POA: Diagnosis not present

## 2015-02-22 DIAGNOSIS — E785 Hyperlipidemia, unspecified: Secondary | ICD-10-CM | POA: Diagnosis not present

## 2015-02-22 DIAGNOSIS — I129 Hypertensive chronic kidney disease with stage 1 through stage 4 chronic kidney disease, or unspecified chronic kidney disease: Secondary | ICD-10-CM | POA: Diagnosis not present

## 2015-02-22 DIAGNOSIS — N189 Chronic kidney disease, unspecified: Secondary | ICD-10-CM | POA: Diagnosis not present

## 2015-02-23 DIAGNOSIS — N390 Urinary tract infection, site not specified: Secondary | ICD-10-CM | POA: Diagnosis not present

## 2015-02-23 DIAGNOSIS — E785 Hyperlipidemia, unspecified: Secondary | ICD-10-CM | POA: Diagnosis not present

## 2015-02-23 DIAGNOSIS — E1165 Type 2 diabetes mellitus with hyperglycemia: Secondary | ICD-10-CM | POA: Diagnosis not present

## 2015-02-23 DIAGNOSIS — I129 Hypertensive chronic kidney disease with stage 1 through stage 4 chronic kidney disease, or unspecified chronic kidney disease: Secondary | ICD-10-CM | POA: Diagnosis not present

## 2015-02-23 DIAGNOSIS — N189 Chronic kidney disease, unspecified: Secondary | ICD-10-CM | POA: Diagnosis not present

## 2015-02-23 DIAGNOSIS — E662 Morbid (severe) obesity with alveolar hypoventilation: Secondary | ICD-10-CM | POA: Diagnosis not present

## 2015-02-24 DIAGNOSIS — I129 Hypertensive chronic kidney disease with stage 1 through stage 4 chronic kidney disease, or unspecified chronic kidney disease: Secondary | ICD-10-CM | POA: Diagnosis not present

## 2015-02-24 DIAGNOSIS — E662 Morbid (severe) obesity with alveolar hypoventilation: Secondary | ICD-10-CM | POA: Diagnosis not present

## 2015-02-24 DIAGNOSIS — N189 Chronic kidney disease, unspecified: Secondary | ICD-10-CM | POA: Diagnosis not present

## 2015-02-24 DIAGNOSIS — N390 Urinary tract infection, site not specified: Secondary | ICD-10-CM | POA: Diagnosis not present

## 2015-02-24 DIAGNOSIS — E785 Hyperlipidemia, unspecified: Secondary | ICD-10-CM | POA: Diagnosis not present

## 2015-02-24 DIAGNOSIS — E1165 Type 2 diabetes mellitus with hyperglycemia: Secondary | ICD-10-CM | POA: Diagnosis not present

## 2015-02-25 DIAGNOSIS — N189 Chronic kidney disease, unspecified: Secondary | ICD-10-CM | POA: Diagnosis not present

## 2015-02-25 DIAGNOSIS — E785 Hyperlipidemia, unspecified: Secondary | ICD-10-CM | POA: Diagnosis not present

## 2015-02-25 DIAGNOSIS — E662 Morbid (severe) obesity with alveolar hypoventilation: Secondary | ICD-10-CM | POA: Diagnosis not present

## 2015-02-25 DIAGNOSIS — N390 Urinary tract infection, site not specified: Secondary | ICD-10-CM | POA: Diagnosis not present

## 2015-02-25 DIAGNOSIS — E1165 Type 2 diabetes mellitus with hyperglycemia: Secondary | ICD-10-CM | POA: Diagnosis not present

## 2015-02-25 DIAGNOSIS — I129 Hypertensive chronic kidney disease with stage 1 through stage 4 chronic kidney disease, or unspecified chronic kidney disease: Secondary | ICD-10-CM | POA: Diagnosis not present

## 2015-03-02 DIAGNOSIS — N189 Chronic kidney disease, unspecified: Secondary | ICD-10-CM | POA: Diagnosis not present

## 2015-03-02 DIAGNOSIS — E785 Hyperlipidemia, unspecified: Secondary | ICD-10-CM | POA: Diagnosis not present

## 2015-03-02 DIAGNOSIS — E662 Morbid (severe) obesity with alveolar hypoventilation: Secondary | ICD-10-CM | POA: Diagnosis not present

## 2015-03-02 DIAGNOSIS — N390 Urinary tract infection, site not specified: Secondary | ICD-10-CM | POA: Diagnosis not present

## 2015-03-02 DIAGNOSIS — I129 Hypertensive chronic kidney disease with stage 1 through stage 4 chronic kidney disease, or unspecified chronic kidney disease: Secondary | ICD-10-CM | POA: Diagnosis not present

## 2015-03-02 DIAGNOSIS — E1165 Type 2 diabetes mellitus with hyperglycemia: Secondary | ICD-10-CM | POA: Diagnosis not present

## 2015-03-04 DIAGNOSIS — N189 Chronic kidney disease, unspecified: Secondary | ICD-10-CM | POA: Diagnosis not present

## 2015-03-04 DIAGNOSIS — N390 Urinary tract infection, site not specified: Secondary | ICD-10-CM | POA: Diagnosis not present

## 2015-03-04 DIAGNOSIS — E1165 Type 2 diabetes mellitus with hyperglycemia: Secondary | ICD-10-CM | POA: Diagnosis not present

## 2015-03-04 DIAGNOSIS — I129 Hypertensive chronic kidney disease with stage 1 through stage 4 chronic kidney disease, or unspecified chronic kidney disease: Secondary | ICD-10-CM | POA: Diagnosis not present

## 2015-03-04 DIAGNOSIS — E785 Hyperlipidemia, unspecified: Secondary | ICD-10-CM | POA: Diagnosis not present

## 2015-03-04 DIAGNOSIS — E662 Morbid (severe) obesity with alveolar hypoventilation: Secondary | ICD-10-CM | POA: Diagnosis not present

## 2015-03-05 DIAGNOSIS — R600 Localized edema: Secondary | ICD-10-CM | POA: Diagnosis not present

## 2015-03-05 DIAGNOSIS — N183 Chronic kidney disease, stage 3 (moderate): Secondary | ICD-10-CM | POA: Diagnosis not present

## 2015-03-05 DIAGNOSIS — E1122 Type 2 diabetes mellitus with diabetic chronic kidney disease: Secondary | ICD-10-CM | POA: Diagnosis not present

## 2015-03-05 DIAGNOSIS — I10 Essential (primary) hypertension: Secondary | ICD-10-CM | POA: Diagnosis not present

## 2015-03-08 DIAGNOSIS — E1165 Type 2 diabetes mellitus with hyperglycemia: Secondary | ICD-10-CM | POA: Diagnosis not present

## 2015-03-08 DIAGNOSIS — N189 Chronic kidney disease, unspecified: Secondary | ICD-10-CM | POA: Diagnosis not present

## 2015-03-08 DIAGNOSIS — I129 Hypertensive chronic kidney disease with stage 1 through stage 4 chronic kidney disease, or unspecified chronic kidney disease: Secondary | ICD-10-CM | POA: Diagnosis not present

## 2015-03-08 DIAGNOSIS — E785 Hyperlipidemia, unspecified: Secondary | ICD-10-CM | POA: Diagnosis not present

## 2015-03-08 DIAGNOSIS — E662 Morbid (severe) obesity with alveolar hypoventilation: Secondary | ICD-10-CM | POA: Diagnosis not present

## 2015-03-08 DIAGNOSIS — N390 Urinary tract infection, site not specified: Secondary | ICD-10-CM | POA: Diagnosis not present

## 2015-03-09 DIAGNOSIS — N390 Urinary tract infection, site not specified: Secondary | ICD-10-CM | POA: Diagnosis not present

## 2015-03-09 DIAGNOSIS — E785 Hyperlipidemia, unspecified: Secondary | ICD-10-CM | POA: Diagnosis not present

## 2015-03-09 DIAGNOSIS — N189 Chronic kidney disease, unspecified: Secondary | ICD-10-CM | POA: Diagnosis not present

## 2015-03-09 DIAGNOSIS — E1165 Type 2 diabetes mellitus with hyperglycemia: Secondary | ICD-10-CM | POA: Diagnosis not present

## 2015-03-09 DIAGNOSIS — E662 Morbid (severe) obesity with alveolar hypoventilation: Secondary | ICD-10-CM | POA: Diagnosis not present

## 2015-03-09 DIAGNOSIS — I129 Hypertensive chronic kidney disease with stage 1 through stage 4 chronic kidney disease, or unspecified chronic kidney disease: Secondary | ICD-10-CM | POA: Diagnosis not present

## 2015-03-10 DIAGNOSIS — E785 Hyperlipidemia, unspecified: Secondary | ICD-10-CM | POA: Diagnosis not present

## 2015-03-10 DIAGNOSIS — I129 Hypertensive chronic kidney disease with stage 1 through stage 4 chronic kidney disease, or unspecified chronic kidney disease: Secondary | ICD-10-CM | POA: Diagnosis not present

## 2015-03-10 DIAGNOSIS — E1165 Type 2 diabetes mellitus with hyperglycemia: Secondary | ICD-10-CM | POA: Diagnosis not present

## 2015-03-10 DIAGNOSIS — N189 Chronic kidney disease, unspecified: Secondary | ICD-10-CM | POA: Diagnosis not present

## 2015-03-10 DIAGNOSIS — N390 Urinary tract infection, site not specified: Secondary | ICD-10-CM | POA: Diagnosis not present

## 2015-03-10 DIAGNOSIS — E662 Morbid (severe) obesity with alveolar hypoventilation: Secondary | ICD-10-CM | POA: Diagnosis not present

## 2015-03-15 DIAGNOSIS — N189 Chronic kidney disease, unspecified: Secondary | ICD-10-CM | POA: Diagnosis not present

## 2015-03-15 DIAGNOSIS — N390 Urinary tract infection, site not specified: Secondary | ICD-10-CM | POA: Diagnosis not present

## 2015-03-15 DIAGNOSIS — E1165 Type 2 diabetes mellitus with hyperglycemia: Secondary | ICD-10-CM | POA: Diagnosis not present

## 2015-03-15 DIAGNOSIS — I129 Hypertensive chronic kidney disease with stage 1 through stage 4 chronic kidney disease, or unspecified chronic kidney disease: Secondary | ICD-10-CM | POA: Diagnosis not present

## 2015-03-15 DIAGNOSIS — E785 Hyperlipidemia, unspecified: Secondary | ICD-10-CM | POA: Diagnosis not present

## 2015-03-15 DIAGNOSIS — E662 Morbid (severe) obesity with alveolar hypoventilation: Secondary | ICD-10-CM | POA: Diagnosis not present

## 2015-03-16 DIAGNOSIS — R600 Localized edema: Secondary | ICD-10-CM | POA: Diagnosis not present

## 2015-03-17 DIAGNOSIS — E662 Morbid (severe) obesity with alveolar hypoventilation: Secondary | ICD-10-CM | POA: Diagnosis not present

## 2015-03-17 DIAGNOSIS — I129 Hypertensive chronic kidney disease with stage 1 through stage 4 chronic kidney disease, or unspecified chronic kidney disease: Secondary | ICD-10-CM | POA: Diagnosis not present

## 2015-03-17 DIAGNOSIS — E1165 Type 2 diabetes mellitus with hyperglycemia: Secondary | ICD-10-CM | POA: Diagnosis not present

## 2015-03-17 DIAGNOSIS — E785 Hyperlipidemia, unspecified: Secondary | ICD-10-CM | POA: Diagnosis not present

## 2015-03-17 DIAGNOSIS — N390 Urinary tract infection, site not specified: Secondary | ICD-10-CM | POA: Diagnosis not present

## 2015-03-17 DIAGNOSIS — N189 Chronic kidney disease, unspecified: Secondary | ICD-10-CM | POA: Diagnosis not present

## 2015-03-18 DIAGNOSIS — I129 Hypertensive chronic kidney disease with stage 1 through stage 4 chronic kidney disease, or unspecified chronic kidney disease: Secondary | ICD-10-CM | POA: Diagnosis not present

## 2015-03-18 DIAGNOSIS — E785 Hyperlipidemia, unspecified: Secondary | ICD-10-CM | POA: Diagnosis not present

## 2015-03-18 DIAGNOSIS — E662 Morbid (severe) obesity with alveolar hypoventilation: Secondary | ICD-10-CM | POA: Diagnosis not present

## 2015-03-18 DIAGNOSIS — N390 Urinary tract infection, site not specified: Secondary | ICD-10-CM | POA: Diagnosis not present

## 2015-03-18 DIAGNOSIS — N189 Chronic kidney disease, unspecified: Secondary | ICD-10-CM | POA: Diagnosis not present

## 2015-03-18 DIAGNOSIS — E1165 Type 2 diabetes mellitus with hyperglycemia: Secondary | ICD-10-CM | POA: Diagnosis not present

## 2015-03-22 DIAGNOSIS — N390 Urinary tract infection, site not specified: Secondary | ICD-10-CM | POA: Diagnosis not present

## 2015-03-22 DIAGNOSIS — I129 Hypertensive chronic kidney disease with stage 1 through stage 4 chronic kidney disease, or unspecified chronic kidney disease: Secondary | ICD-10-CM | POA: Diagnosis not present

## 2015-03-22 DIAGNOSIS — E662 Morbid (severe) obesity with alveolar hypoventilation: Secondary | ICD-10-CM | POA: Diagnosis not present

## 2015-03-22 DIAGNOSIS — E785 Hyperlipidemia, unspecified: Secondary | ICD-10-CM | POA: Diagnosis not present

## 2015-03-22 DIAGNOSIS — N189 Chronic kidney disease, unspecified: Secondary | ICD-10-CM | POA: Diagnosis not present

## 2015-03-22 DIAGNOSIS — E1165 Type 2 diabetes mellitus with hyperglycemia: Secondary | ICD-10-CM | POA: Diagnosis not present

## 2015-03-24 DIAGNOSIS — I129 Hypertensive chronic kidney disease with stage 1 through stage 4 chronic kidney disease, or unspecified chronic kidney disease: Secondary | ICD-10-CM | POA: Diagnosis not present

## 2015-03-24 DIAGNOSIS — E785 Hyperlipidemia, unspecified: Secondary | ICD-10-CM | POA: Diagnosis not present

## 2015-03-24 DIAGNOSIS — E1165 Type 2 diabetes mellitus with hyperglycemia: Secondary | ICD-10-CM | POA: Diagnosis not present

## 2015-03-24 DIAGNOSIS — N390 Urinary tract infection, site not specified: Secondary | ICD-10-CM | POA: Diagnosis not present

## 2015-03-24 DIAGNOSIS — E662 Morbid (severe) obesity with alveolar hypoventilation: Secondary | ICD-10-CM | POA: Diagnosis not present

## 2015-03-24 DIAGNOSIS — N189 Chronic kidney disease, unspecified: Secondary | ICD-10-CM | POA: Diagnosis not present

## 2015-03-26 DIAGNOSIS — N183 Chronic kidney disease, stage 3 (moderate): Secondary | ICD-10-CM | POA: Diagnosis not present

## 2015-03-26 DIAGNOSIS — I10 Essential (primary) hypertension: Secondary | ICD-10-CM | POA: Diagnosis not present

## 2015-03-26 DIAGNOSIS — E1122 Type 2 diabetes mellitus with diabetic chronic kidney disease: Secondary | ICD-10-CM | POA: Diagnosis not present

## 2015-03-29 DIAGNOSIS — E1165 Type 2 diabetes mellitus with hyperglycemia: Secondary | ICD-10-CM | POA: Diagnosis not present

## 2015-03-29 DIAGNOSIS — N189 Chronic kidney disease, unspecified: Secondary | ICD-10-CM | POA: Diagnosis not present

## 2015-03-29 DIAGNOSIS — I129 Hypertensive chronic kidney disease with stage 1 through stage 4 chronic kidney disease, or unspecified chronic kidney disease: Secondary | ICD-10-CM | POA: Diagnosis not present

## 2015-03-29 DIAGNOSIS — E785 Hyperlipidemia, unspecified: Secondary | ICD-10-CM | POA: Diagnosis not present

## 2015-03-29 DIAGNOSIS — E662 Morbid (severe) obesity with alveolar hypoventilation: Secondary | ICD-10-CM | POA: Diagnosis not present

## 2015-03-29 DIAGNOSIS — N390 Urinary tract infection, site not specified: Secondary | ICD-10-CM | POA: Diagnosis not present

## 2015-03-31 DIAGNOSIS — E785 Hyperlipidemia, unspecified: Secondary | ICD-10-CM | POA: Diagnosis not present

## 2015-03-31 DIAGNOSIS — N189 Chronic kidney disease, unspecified: Secondary | ICD-10-CM | POA: Diagnosis not present

## 2015-03-31 DIAGNOSIS — E1165 Type 2 diabetes mellitus with hyperglycemia: Secondary | ICD-10-CM | POA: Diagnosis not present

## 2015-03-31 DIAGNOSIS — E662 Morbid (severe) obesity with alveolar hypoventilation: Secondary | ICD-10-CM | POA: Diagnosis not present

## 2015-03-31 DIAGNOSIS — I129 Hypertensive chronic kidney disease with stage 1 through stage 4 chronic kidney disease, or unspecified chronic kidney disease: Secondary | ICD-10-CM | POA: Diagnosis not present

## 2015-03-31 DIAGNOSIS — N390 Urinary tract infection, site not specified: Secondary | ICD-10-CM | POA: Diagnosis not present

## 2015-04-05 DIAGNOSIS — E662 Morbid (severe) obesity with alveolar hypoventilation: Secondary | ICD-10-CM | POA: Diagnosis not present

## 2015-04-05 DIAGNOSIS — N390 Urinary tract infection, site not specified: Secondary | ICD-10-CM | POA: Diagnosis not present

## 2015-04-05 DIAGNOSIS — N189 Chronic kidney disease, unspecified: Secondary | ICD-10-CM | POA: Diagnosis not present

## 2015-04-05 DIAGNOSIS — E785 Hyperlipidemia, unspecified: Secondary | ICD-10-CM | POA: Diagnosis not present

## 2015-04-05 DIAGNOSIS — I129 Hypertensive chronic kidney disease with stage 1 through stage 4 chronic kidney disease, or unspecified chronic kidney disease: Secondary | ICD-10-CM | POA: Diagnosis not present

## 2015-04-05 DIAGNOSIS — E1165 Type 2 diabetes mellitus with hyperglycemia: Secondary | ICD-10-CM | POA: Diagnosis not present

## 2015-04-07 DIAGNOSIS — E662 Morbid (severe) obesity with alveolar hypoventilation: Secondary | ICD-10-CM | POA: Diagnosis not present

## 2015-04-07 DIAGNOSIS — E785 Hyperlipidemia, unspecified: Secondary | ICD-10-CM | POA: Diagnosis not present

## 2015-04-07 DIAGNOSIS — N390 Urinary tract infection, site not specified: Secondary | ICD-10-CM | POA: Diagnosis not present

## 2015-04-07 DIAGNOSIS — N189 Chronic kidney disease, unspecified: Secondary | ICD-10-CM | POA: Diagnosis not present

## 2015-04-07 DIAGNOSIS — E1165 Type 2 diabetes mellitus with hyperglycemia: Secondary | ICD-10-CM | POA: Diagnosis not present

## 2015-04-07 DIAGNOSIS — I129 Hypertensive chronic kidney disease with stage 1 through stage 4 chronic kidney disease, or unspecified chronic kidney disease: Secondary | ICD-10-CM | POA: Diagnosis not present

## 2015-04-08 DIAGNOSIS — I10 Essential (primary) hypertension: Secondary | ICD-10-CM | POA: Diagnosis not present

## 2015-04-08 DIAGNOSIS — R600 Localized edema: Secondary | ICD-10-CM | POA: Diagnosis not present

## 2015-04-14 DIAGNOSIS — E780 Pure hypercholesterolemia: Secondary | ICD-10-CM | POA: Diagnosis not present

## 2015-04-14 DIAGNOSIS — R5383 Other fatigue: Secondary | ICD-10-CM | POA: Diagnosis not present

## 2015-04-14 DIAGNOSIS — F0390 Unspecified dementia without behavioral disturbance: Secondary | ICD-10-CM | POA: Diagnosis not present

## 2015-04-14 DIAGNOSIS — E119 Type 2 diabetes mellitus without complications: Secondary | ICD-10-CM | POA: Diagnosis not present

## 2015-04-14 DIAGNOSIS — E559 Vitamin D deficiency, unspecified: Secondary | ICD-10-CM | POA: Diagnosis not present

## 2015-04-14 DIAGNOSIS — E669 Obesity, unspecified: Secondary | ICD-10-CM | POA: Diagnosis not present

## 2015-04-14 DIAGNOSIS — Z8673 Personal history of transient ischemic attack (TIA), and cerebral infarction without residual deficits: Secondary | ICD-10-CM | POA: Diagnosis not present

## 2015-04-14 DIAGNOSIS — I2510 Atherosclerotic heart disease of native coronary artery without angina pectoris: Secondary | ICD-10-CM | POA: Diagnosis not present

## 2015-04-14 DIAGNOSIS — R112 Nausea with vomiting, unspecified: Secondary | ICD-10-CM | POA: Diagnosis not present

## 2015-04-14 DIAGNOSIS — I10 Essential (primary) hypertension: Secondary | ICD-10-CM | POA: Diagnosis not present

## 2015-04-28 DIAGNOSIS — R5383 Other fatigue: Secondary | ICD-10-CM | POA: Diagnosis not present

## 2015-04-28 DIAGNOSIS — R8290 Unspecified abnormal findings in urine: Secondary | ICD-10-CM | POA: Diagnosis not present

## 2015-04-30 ENCOUNTER — Encounter: Payer: Self-pay | Admitting: Pulmonary Disease

## 2015-04-30 ENCOUNTER — Ambulatory Visit (INDEPENDENT_AMBULATORY_CARE_PROVIDER_SITE_OTHER): Payer: Medicare Other | Admitting: Pulmonary Disease

## 2015-04-30 VITALS — BP 122/78 | HR 65 | Ht 67.0 in | Wt 239.0 lb

## 2015-04-30 DIAGNOSIS — E662 Morbid (severe) obesity with alveolar hypoventilation: Secondary | ICD-10-CM | POA: Diagnosis not present

## 2015-04-30 NOTE — Patient Instructions (Signed)
Will arrange for overnight oxygen test Follow up in 1 year

## 2015-04-30 NOTE — Progress Notes (Signed)
Chief Complaint  Patient presents with  . Follow-up    patient doing well.  no concerns.    History of Present Illness: Angelica Beck is a 64 y.o. female with obesity hypoventilation syndrome using 2 liters oxygen.  She is accompanied by a family member.  She has not been using oxygen all the time at night.  She is not sure it is helping.  She was in hospital a few months ago with UTI and AKI.  She was having swelling in her legs >> this is getting better.  TESTS: PSG 07/20/13 >> AHI 0.6, SpO2 low 88%, Spent 6.6 min with SpO2 < 90%, PLMI 58.5, decreased sleep time ONO with RA 07/30/13 >> Test time 9 hrs 47 min. Baseline SpO2 94%, low SpO2 68%. Spent 1 hr 6 min with SpO2 < 89%.  PMHx >> HTN, CVA, DM, HLD, Hypothyroidism  PSHx, Medications, Allergies, Fhx, Shx reviewed.   Physical Exam: BP 122/78 mmHg  Pulse 65  Ht '5\' 7"'$  (1.702 m)  Wt 239 lb (108.41 kg)  BMI 37.42 kg/m2  SpO2 97%  General - No distress, hirsutism appearance ENT - No sinus tenderness, no oral exudate, no LAN Cardiac - s1s2 regular, no murmur Chest - No wheeze/rales/dullness Back - No focal tenderness Abd - Soft, non-tender Ext - No edema Neuro - Normal strength Skin - No rashes Psych - normal mood, and behavior   Assessment/Plan:  Obesity hypoventilation syndrome. Plan: - will arrange for ONO on room air and then determine if she still needs oxygen at night - in meantime she should continue to use 2 liters oxygen with sleep - discussed importance of weight loss   Chesley Mires, MD [place] [facility] Pager:  [phone]

## 2015-05-06 DIAGNOSIS — R0602 Shortness of breath: Secondary | ICD-10-CM | POA: Diagnosis not present

## 2015-05-16 ENCOUNTER — Telehealth: Payer: Self-pay | Admitting: Pulmonary Disease

## 2015-05-16 NOTE — Telephone Encounter (Signed)
ONO with RA 05/06/15 >> Test time 7 hrs 26 min.  Mean SpO2 77%, low SpO2 59%.  Spent 5 hrs 48 min with SpO2 < 88%.   Will have my nurse inform pt that oxygen levels still are significant low at night w/o supplemental oxygen.  She needs to continue using oxygen at night.

## 2015-05-18 NOTE — Telephone Encounter (Addendum)
ATC x 1 Mailbox full Unable to leave voicemail

## 2015-05-26 NOTE — Telephone Encounter (Signed)
Called spoke w/ pt caregiver. She is aware of results and have pt wear O2 at night I also called pt daughter [name] and [facility] x1

## 2015-06-03 NOTE — Telephone Encounter (Signed)
Called and spoke with pt's daughter.  Reviewed VS's results and recs She stated that pt is wearing O2 at bedtime She voiced understanding and had no further questions.  Nothing further needed

## 2015-06-08 DIAGNOSIS — N390 Urinary tract infection, site not specified: Secondary | ICD-10-CM | POA: Diagnosis not present

## 2015-06-21 ENCOUNTER — Encounter: Payer: Self-pay | Admitting: Pulmonary Disease

## 2015-06-22 ENCOUNTER — Ambulatory Visit (INDEPENDENT_AMBULATORY_CARE_PROVIDER_SITE_OTHER): Payer: Medicare Other | Admitting: Podiatry

## 2015-06-22 ENCOUNTER — Encounter: Payer: Self-pay | Admitting: Podiatry

## 2015-06-22 DIAGNOSIS — M79676 Pain in unspecified toe(s): Secondary | ICD-10-CM

## 2015-06-22 DIAGNOSIS — B351 Tinea unguium: Secondary | ICD-10-CM

## 2015-06-22 NOTE — Progress Notes (Signed)
Patient ID: Angelica Beck, female   DOB: Dec 10, 1950, 64 y.o.   MRN: [id number] Complaint:  Visit Type: Patient returns to my office for continued preventative foot care services. Complaint: Patient states" my nails have grown long and thick and become painful to walk and wear shoes" Patient has been diagnosed with DM with no foot complications. The patient presents for preventative foot care services. No changes to ROS  Podiatric Exam: Vascular: dorsalis pedis and posterior tibial pulses are palpable bilateral. Capillary return is immediate. Temperature gradient is WNL. Skin turgor WNL  Sensorium: Normal Semmes Weinstein monofilament test. Normal tactile sensation bilaterally. Nail Exam: Pt has thick disfigured discolored nails with subungual debris noted bilateral entire nail hallux through fifth toenails Ulcer Exam: There is no evidence of ulcer or pre-ulcerative changes or infection. Orthopedic Exam: Muscle tone and strength are WNL. No limitations in general ROM. No crepitus or effusions noted. Foot type and digits show no abnormalities. Bony prominences are unremarkable. Skin: No Porokeratosis. No infection or ulcers  Diagnosis:  Onychomycosis, , Pain in right toe, pain in left toes  Treatment & Plan Procedures and Treatment: Consent by patient was obtained for treatment procedures. The patient understood the discussion of treatment and procedures well. All questions were answered thoroughly reviewed. Debridement of mycotic and hypertrophic toenails, 1 through 5 bilateral and clearing of subungual debris. No ulceration, no infection noted.  Return Visit-Office Procedure: Patient instructed to return to the office for a follow up visit 3 months for continued evaluation and treatment.

## 2015-07-07 DIAGNOSIS — R7989 Other specified abnormal findings of blood chemistry: Secondary | ICD-10-CM | POA: Diagnosis not present

## 2015-07-07 DIAGNOSIS — N179 Acute kidney failure, unspecified: Secondary | ICD-10-CM | POA: Diagnosis not present

## 2015-07-07 DIAGNOSIS — I129 Hypertensive chronic kidney disease with stage 1 through stage 4 chronic kidney disease, or unspecified chronic kidney disease: Secondary | ICD-10-CM | POA: Diagnosis not present

## 2015-07-07 DIAGNOSIS — E1129 Type 2 diabetes mellitus with other diabetic kidney complication: Secondary | ICD-10-CM | POA: Diagnosis not present

## 2015-07-09 DIAGNOSIS — R7989 Other specified abnormal findings of blood chemistry: Secondary | ICD-10-CM | POA: Diagnosis not present

## 2015-07-19 ENCOUNTER — Other Ambulatory Visit (HOSPITAL_COMMUNITY): Payer: Self-pay | Admitting: *Deleted

## 2015-07-20 ENCOUNTER — Encounter (HOSPITAL_COMMUNITY)
Admission: RE | Admit: 2015-07-20 | Discharge: 2015-07-20 | Disposition: A | Discharge status: Home / Self Care | Payer: Medicare Other | Source: Ambulatory Visit | Attending: Nephrology | Admitting: Nephrology

## 2015-07-20 DIAGNOSIS — D509 Iron deficiency anemia, unspecified: Secondary | ICD-10-CM | POA: Insufficient documentation

## 2015-07-20 MED ORDER — FERUMOXYTOL INJECTION 510 MG/17 ML
510.0000 mg | Freq: Once | INTRAVENOUS | Status: AC
  Administered 2015-07-20: 510 mg via INTRAVENOUS
  Filled 2015-07-20: qty 17

## 2015-08-02 ENCOUNTER — Ambulatory Visit (HOSPITAL_COMMUNITY): Payer: Medicare Other

## 2015-08-02 ENCOUNTER — Emergency Department (HOSPITAL_COMMUNITY): Payer: Medicare Other

## 2015-08-02 ENCOUNTER — Encounter (HOSPITAL_COMMUNITY): Payer: Self-pay

## 2015-08-02 ENCOUNTER — Inpatient Hospital Stay (HOSPITAL_COMMUNITY): Payer: Medicare Other

## 2015-08-02 ENCOUNTER — Inpatient Hospital Stay (HOSPITAL_COMMUNITY)
Admission: EM | Admit: 2015-08-02 | Discharge: 2015-08-06 | DRG: 871 | Disposition: A | Discharge status: Rehabilitation Facility | Payer: Medicare Other | Attending: Internal Medicine | Admitting: Internal Medicine

## 2015-08-02 DIAGNOSIS — Z23 Encounter for immunization: Secondary | ICD-10-CM

## 2015-08-02 DIAGNOSIS — E0590 Thyrotoxicosis, unspecified without thyrotoxic crisis or storm: Secondary | ICD-10-CM | POA: Diagnosis present

## 2015-08-02 DIAGNOSIS — E1165 Type 2 diabetes mellitus with hyperglycemia: Secondary | ICD-10-CM | POA: Diagnosis not present

## 2015-08-02 DIAGNOSIS — E872 Acidosis, unspecified: Secondary | ICD-10-CM

## 2015-08-02 DIAGNOSIS — N189 Chronic kidney disease, unspecified: Secondary | ICD-10-CM | POA: Diagnosis present

## 2015-08-02 DIAGNOSIS — N170 Acute kidney failure with tubular necrosis: Secondary | ICD-10-CM | POA: Diagnosis present

## 2015-08-02 DIAGNOSIS — I2510 Atherosclerotic heart disease of native coronary artery without angina pectoris: Secondary | ICD-10-CM | POA: Diagnosis present

## 2015-08-02 DIAGNOSIS — I130 Hypertensive heart and chronic kidney disease with heart failure and stage 1 through stage 4 chronic kidney disease, or unspecified chronic kidney disease: Secondary | ICD-10-CM | POA: Diagnosis present

## 2015-08-02 DIAGNOSIS — R05 Cough: Secondary | ICD-10-CM

## 2015-08-02 DIAGNOSIS — E1122 Type 2 diabetes mellitus with diabetic chronic kidney disease: Secondary | ICD-10-CM | POA: Diagnosis present

## 2015-08-02 DIAGNOSIS — I959 Hypotension, unspecified: Secondary | ICD-10-CM | POA: Diagnosis present

## 2015-08-02 DIAGNOSIS — Z7982 Long term (current) use of aspirin: Secondary | ICD-10-CM | POA: Diagnosis not present

## 2015-08-02 DIAGNOSIS — I5022 Chronic systolic (congestive) heart failure: Secondary | ICD-10-CM | POA: Diagnosis not present

## 2015-08-02 DIAGNOSIS — R1313 Dysphagia, pharyngeal phase: Secondary | ICD-10-CM | POA: Diagnosis present

## 2015-08-02 DIAGNOSIS — Z794 Long term (current) use of insulin: Secondary | ICD-10-CM | POA: Diagnosis not present

## 2015-08-02 DIAGNOSIS — R4189 Other symptoms and signs involving cognitive functions and awareness: Secondary | ICD-10-CM | POA: Diagnosis not present

## 2015-08-02 DIAGNOSIS — R1311 Dysphagia, oral phase: Secondary | ICD-10-CM | POA: Diagnosis present

## 2015-08-02 DIAGNOSIS — E785 Hyperlipidemia, unspecified: Secondary | ICD-10-CM | POA: Diagnosis present

## 2015-08-02 DIAGNOSIS — K219 Gastro-esophageal reflux disease without esophagitis: Secondary | ICD-10-CM | POA: Diagnosis present

## 2015-08-02 DIAGNOSIS — N390 Urinary tract infection, site not specified: Secondary | ICD-10-CM | POA: Diagnosis not present

## 2015-08-02 DIAGNOSIS — R4182 Altered mental status, unspecified: Secondary | ICD-10-CM

## 2015-08-02 DIAGNOSIS — I110 Hypertensive heart disease with heart failure: Secondary | ICD-10-CM | POA: Diagnosis present

## 2015-08-02 DIAGNOSIS — E11649 Type 2 diabetes mellitus with hypoglycemia without coma: Secondary | ICD-10-CM | POA: Diagnosis present

## 2015-08-02 DIAGNOSIS — N179 Acute kidney failure, unspecified: Secondary | ICD-10-CM

## 2015-08-02 DIAGNOSIS — I69351 Hemiplegia and hemiparesis following cerebral infarction affecting right dominant side: Secondary | ICD-10-CM | POA: Diagnosis not present

## 2015-08-02 DIAGNOSIS — R000 Tachycardia, unspecified: Secondary | ICD-10-CM | POA: Diagnosis not present

## 2015-08-02 DIAGNOSIS — R109 Unspecified abdominal pain: Secondary | ICD-10-CM | POA: Diagnosis not present

## 2015-08-02 DIAGNOSIS — E161 Other hypoglycemia: Secondary | ICD-10-CM | POA: Diagnosis not present

## 2015-08-02 DIAGNOSIS — I10 Essential (primary) hypertension: Secondary | ICD-10-CM | POA: Diagnosis present

## 2015-08-02 DIAGNOSIS — A419 Sepsis, unspecified organism: Secondary | ICD-10-CM | POA: Diagnosis not present

## 2015-08-02 DIAGNOSIS — I129 Hypertensive chronic kidney disease with stage 1 through stage 4 chronic kidney disease, or unspecified chronic kidney disease: Secondary | ICD-10-CM | POA: Diagnosis present

## 2015-08-02 DIAGNOSIS — Z880 Allergy status to penicillin: Secondary | ICD-10-CM | POA: Diagnosis not present

## 2015-08-02 DIAGNOSIS — R059 Cough, unspecified: Secondary | ICD-10-CM

## 2015-08-02 DIAGNOSIS — G9340 Encephalopathy, unspecified: Secondary | ICD-10-CM | POA: Diagnosis not present

## 2015-08-02 DIAGNOSIS — Z8673 Personal history of transient ischemic attack (TIA), and cerebral infarction without residual deficits: Secondary | ICD-10-CM

## 2015-08-02 DIAGNOSIS — E662 Morbid (severe) obesity with alveolar hypoventilation: Secondary | ICD-10-CM | POA: Diagnosis present

## 2015-08-02 DIAGNOSIS — E1142 Type 2 diabetes mellitus with diabetic polyneuropathy: Secondary | ICD-10-CM | POA: Diagnosis present

## 2015-08-02 DIAGNOSIS — E162 Hypoglycemia, unspecified: Secondary | ICD-10-CM

## 2015-08-02 DIAGNOSIS — A499 Bacterial infection, unspecified: Secondary | ICD-10-CM | POA: Diagnosis not present

## 2015-08-02 DIAGNOSIS — Z87891 Personal history of nicotine dependence: Secondary | ICD-10-CM | POA: Diagnosis not present

## 2015-08-02 DIAGNOSIS — Z7902 Long term (current) use of antithrombotics/antiplatelets: Secondary | ICD-10-CM | POA: Diagnosis not present

## 2015-08-02 DIAGNOSIS — R269 Unspecified abnormalities of gait and mobility: Secondary | ICD-10-CM | POA: Diagnosis not present

## 2015-08-02 DIAGNOSIS — IMO0001 Reserved for inherently not codable concepts without codable children: Secondary | ICD-10-CM

## 2015-08-02 LAB — URINALYSIS, ROUTINE W REFLEX MICROSCOPIC
Bilirubin Urine: NEGATIVE
Glucose, UA: NEGATIVE mg/dL
KETONES UR: 40 mg/dL — AB
LEUKOCYTES UA: NEGATIVE
NITRITE: NEGATIVE
Protein, ur: 30 mg/dL — AB
Specific Gravity, Urine: 1.012 (ref 1.005–1.030)
Urobilinogen, UA: 0.2 mg/dL (ref 0.0–1.0)
pH: 5.5 (ref 5.0–8.0)

## 2015-08-02 LAB — GLUCOSE, CAPILLARY
GLUCOSE-CAPILLARY: 111 mg/dL — AB (ref 65–99)
GLUCOSE-CAPILLARY: 130 mg/dL — AB (ref 65–99)
Glucose-Capillary: 67 mg/dL (ref 65–99)
Glucose-Capillary: 108 mg/dL — ABNORMAL HIGH (ref 65–99)

## 2015-08-02 LAB — I-STAT VENOUS BLOOD GAS, ED
Acid-base deficit: 9 mmol/L — ABNORMAL HIGH (ref 0.0–2.0)
Bicarbonate: 16 mEq/L — ABNORMAL LOW (ref 20.0–24.0)
O2 Saturation: 96 %
PH VEN: 7.331 — AB (ref 7.250–7.300)
TCO2: 17 mmol/L (ref 0–100)
pCO2, Ven: 30.2 mmHg — ABNORMAL LOW (ref 45.0–50.0)
pO2, Ven: 84 mmHg — ABNORMAL HIGH (ref 30.0–45.0)

## 2015-08-02 LAB — BASIC METABOLIC PANEL
ANION GAP: 28 — AB (ref 5–15)
BUN: 43 mg/dL — ABNORMAL HIGH (ref 6–20)
CHLORIDE: 96 mmol/L — AB (ref 101–111)
CO2: 15 mmol/L — AB (ref 22–32)
Calcium: 9.6 mg/dL (ref 8.9–10.3)
Creatinine, Ser: 6.06 mg/dL — ABNORMAL HIGH (ref 0.44–1.00)
GFR calc Af Amer: 8 mL/min — ABNORMAL LOW (ref >60)
GFR calc non Af Amer: 7 mL/min — ABNORMAL LOW (ref >60)
GLUCOSE: 165 mg/dL — AB (ref 65–99)
POTASSIUM: 5 mmol/L (ref 3.5–5.1)
Sodium: 139 mmol/L (ref 135–145)

## 2015-08-02 LAB — CBC WITH DIFFERENTIAL/PLATELET
Basophils Absolute: 0 10*3/uL (ref 0.0–0.1)
Basophils Relative: 0 %
Eosinophils Absolute: 0 10*3/uL (ref 0.0–0.7)
Eosinophils Relative: 0 %
HEMATOCRIT: 34.3 % — AB (ref 36.0–46.0)
Hemoglobin: 10.7 g/dL — ABNORMAL LOW (ref 12.0–15.0)
LYMPHS ABS: 1 10*3/uL (ref 0.7–4.0)
LYMPHS PCT: 8 %
MCH: 24.9 pg — ABNORMAL LOW (ref 26.0–34.0)
MCHC: 31.2 g/dL (ref 30.0–36.0)
MCV: 80 fL (ref 78.0–100.0)
MONO ABS: 0.3 10*3/uL (ref 0.1–1.0)
MONOS PCT: 3 %
Neutro Abs: 10.3 10*3/uL — ABNORMAL HIGH (ref 1.7–7.7)
Neutrophils Relative %: 89 %
Platelets: 237 10*3/uL (ref 150–400)
RBC: 4.29 MIL/uL (ref 3.87–5.11)
RDW: 18.7 % — AB (ref 11.5–15.5)
WBC: 11.6 10*3/uL — ABNORMAL HIGH (ref 4.0–10.5)

## 2015-08-02 LAB — URINE MICROSCOPIC-ADD ON

## 2015-08-02 LAB — I-STAT CG4 LACTIC ACID, ED
LACTIC ACID, VENOUS: 10.89 mmol/L — AB (ref 0.5–2.0)
LACTIC ACID, VENOUS: 6 mmol/L — AB (ref 0.5–2.0)

## 2015-08-02 LAB — I-STAT TROPONIN, ED: TROPONIN I, POC: 0.01 ng/mL (ref 0.00–0.08)

## 2015-08-02 LAB — CBG MONITORING, ED
GLUCOSE-CAPILLARY: 152 mg/dL — AB (ref 65–99)
Glucose-Capillary: 33 mg/dL — CL (ref 65–99)

## 2015-08-02 LAB — TSH: TSH: 0.158 u[IU]/mL — AB (ref 0.350–4.500)

## 2015-08-02 LAB — LACTIC ACID, PLASMA
LACTIC ACID, VENOUS: 1.9 mmol/L (ref 0.5–2.0)
Lactic Acid, Venous: 3.4 mmol/L (ref 0.5–2.0)

## 2015-08-02 LAB — RAPID URINE DRUG SCREEN, HOSP PERFORMED
AMPHETAMINES: NOT DETECTED
BARBITURATES: NOT DETECTED
Benzodiazepines: NOT DETECTED
Cocaine: NOT DETECTED
Opiates: NOT DETECTED
Tetrahydrocannabinol: NOT DETECTED

## 2015-08-02 LAB — T4, FREE: Free T4: 1.46 ng/dL — ABNORMAL HIGH (ref 0.61–1.12)

## 2015-08-02 LAB — MRSA PCR SCREENING: MRSA BY PCR: NEGATIVE

## 2015-08-02 LAB — PROCALCITONIN: PROCALCITONIN: 0.3 ng/mL

## 2015-08-02 MED ORDER — ROSUVASTATIN CALCIUM 40 MG PO TABS
40.0000 mg | ORAL_TABLET | Freq: Every day | ORAL | Status: DC
  Filled 2015-08-02: qty 1

## 2015-08-02 MED ORDER — CLOPIDOGREL BISULFATE 75 MG PO TABS
75.0000 mg | ORAL_TABLET | Freq: Every day | ORAL | Status: DC
  Administered 2015-08-06: 75 mg via ORAL
  Filled 2015-08-02 (×3): qty 1

## 2015-08-02 MED ORDER — INSULIN ASPART 100 UNIT/ML ~~LOC~~ SOLN: 0.0000 [IU] | Freq: Three times a day (TID) | SUBCUTANEOUS | Status: DC

## 2015-08-02 MED ORDER — VANCOMYCIN HCL IN DEXTROSE 1-5 GM/200ML-% IV SOLN
1000.0000 mg | Freq: Once | INTRAVENOUS | Status: DC
  Filled 2015-08-02: qty 200

## 2015-08-02 MED ORDER — CETYLPYRIDINIUM CHLORIDE 0.05 % MT LIQD
7.0000 mL | Freq: Two times a day (BID) | OROMUCOSAL | Status: DC
  Administered 2015-08-02 – 2015-08-03 (×3): 7 mL via OROMUCOSAL

## 2015-08-02 MED ORDER — NIACIN ER (ANTIHYPERLIPIDEMIC) 500 MG PO TBCR
1000.0000 mg | EXTENDED_RELEASE_TABLET | Freq: Every day | ORAL | Status: DC
Start: 2015-08-02 — End: 2015-08-06
  Filled 2015-08-02 (×5): qty 2

## 2015-08-02 MED ORDER — SODIUM CHLORIDE 0.9 % IV BOLUS (SEPSIS): 500.0000 mL | INTRAVENOUS | Status: AC

## 2015-08-02 MED ORDER — HEPARIN SODIUM (PORCINE) 5000 UNIT/ML IJ SOLN
5000.0000 [IU] | Freq: Three times a day (TID) | INTRAMUSCULAR | Status: DC
  Administered 2015-08-02 – 2015-08-06 (×13): 5000 [IU] via SUBCUTANEOUS
  Filled 2015-08-02 (×19): qty 1

## 2015-08-02 MED ORDER — DEXTROSE 50 % IV SOLN
1.0000 | Freq: Once | INTRAVENOUS | Status: AC
  Administered 2015-08-02: 50 mL via INTRAVENOUS

## 2015-08-02 MED ORDER — SODIUM CHLORIDE 0.9 % IV BOLUS (SEPSIS)
1000.0000 mL | INTRAVENOUS | Status: AC
  Administered 2015-08-02 (×3): 1000 mL via INTRAVENOUS

## 2015-08-02 MED ORDER — LEVOFLOXACIN IN D5W 500 MG/100ML IV SOLN: 500.0000 mg | INTRAVENOUS | Status: DC

## 2015-08-02 MED ORDER — SODIUM CHLORIDE 0.9 % IV SOLN: INTRAVENOUS | Status: DC

## 2015-08-02 MED ORDER — SODIUM CHLORIDE 0.9 % IV BOLUS (SEPSIS)
500.0000 mL | Freq: Once | INTRAVENOUS | Status: AC
  Administered 2015-08-02: 500 mL via INTRAVENOUS

## 2015-08-02 MED ORDER — VANCOMYCIN HCL 10 G IV SOLR
2000.0000 mg | Freq: Once | INTRAVENOUS | Status: AC
  Administered 2015-08-02: 2000 mg via INTRAVENOUS
  Filled 2015-08-02: qty 2000

## 2015-08-02 MED ORDER — METOPROLOL SUCCINATE ER 50 MG PO TB24
50.0000 mg | ORAL_TABLET | Freq: Every day | ORAL | Status: DC
  Filled 2015-08-02 (×2): qty 1

## 2015-08-02 MED ORDER — FAMOTIDINE 20 MG PO TABS
10.0000 mg | ORAL_TABLET | Freq: Two times a day (BID) | ORAL | Status: DC
  Administered 2015-08-06: 10 mg via ORAL
  Filled 2015-08-02 (×7): qty 1

## 2015-08-02 MED ORDER — NITROGLYCERIN 0.4 MG SL SUBL: 0.4000 mg | SUBLINGUAL_TABLET | SUBLINGUAL | Status: DC | PRN

## 2015-08-02 MED ORDER — DEXTROSE 5 % IV SOLN
2.0000 g | Freq: Once | INTRAVENOUS | Status: AC
  Administered 2015-08-02: 2 g via INTRAVENOUS
  Filled 2015-08-02: qty 2

## 2015-08-02 MED ORDER — GABAPENTIN 100 MG PO CAPS: 100.0000 mg | ORAL_CAPSULE | Freq: Every day | ORAL | Status: DC

## 2015-08-02 MED ORDER — LEVOFLOXACIN IN D5W 750 MG/150ML IV SOLN
750.0000 mg | Freq: Once | INTRAVENOUS | Status: AC
  Administered 2015-08-02: 750 mg via INTRAVENOUS
  Filled 2015-08-02: qty 150

## 2015-08-02 MED ORDER — DEXTROSE 5 % IV SOLN
500.0000 mg | Freq: Three times a day (TID) | INTRAVENOUS | Status: DC
  Administered 2015-08-02 – 2015-08-06 (×12): 500 mg via INTRAVENOUS
  Filled 2015-08-02 (×17): qty 0.5

## 2015-08-02 MED ORDER — ACETAMINOPHEN 325 MG PO TABS: 650.0000 mg | ORAL_TABLET | Freq: Four times a day (QID) | ORAL | Status: DC | PRN

## 2015-08-02 MED ORDER — BISACODYL 10 MG RE SUPP: 10.0000 mg | Freq: Every day | RECTAL | Status: DC | PRN

## 2015-08-02 MED ORDER — BISACODYL 10 MG RE SUPP
10.0000 mg | Freq: Every day | RECTAL | Status: DC
  Administered 2015-08-03 – 2015-08-06 (×4): 10 mg via RECTAL
  Filled 2015-08-02 (×4): qty 1

## 2015-08-02 MED ORDER — SENNOSIDES-DOCUSATE SODIUM 8.6-50 MG PO TABS: 1.0000 | ORAL_TABLET | Freq: Every evening | ORAL | Status: DC | PRN

## 2015-08-02 MED ORDER — DEXTROSE 50 % IV SOLN: 1.0000 | Freq: Once | INTRAVENOUS | Status: DC | PRN

## 2015-08-02 MED ORDER — DEXTROSE 50 % IV SOLN
INTRAVENOUS | Status: AC
  Filled 2015-08-02: qty 50

## 2015-08-02 MED ORDER — ADULT MULTIVITAMIN W/MINERALS CH
1.0000 | ORAL_TABLET | Freq: Every day | ORAL | Status: DC
  Administered 2015-08-06: 1 via ORAL
  Filled 2015-08-02 (×3): qty 1

## 2015-08-02 MED ORDER — PROPYLTHIOURACIL 50 MG PO TABS
50.0000 mg | ORAL_TABLET | Freq: Two times a day (BID) | ORAL | Status: DC
  Administered 2015-08-06: 50 mg via ORAL
  Filled 2015-08-02 (×10): qty 1

## 2015-08-02 MED ORDER — DEXTROSE-NACL 5-0.9 % IV SOLN
INTRAVENOUS | Status: AC
  Administered 2015-08-02: 16:00:00 via INTRAVENOUS

## 2015-08-02 MED ORDER — GLUCOSE 40 % PO GEL
1.0000 | Freq: Once | ORAL | Status: AC
  Administered 2015-08-02: 37.5 g via ORAL
  Filled 2015-08-02: qty 1

## 2015-08-02 MED ORDER — DEXTROSE 50 % IV SOLN
INTRAVENOUS | Status: AC
  Administered 2015-08-02: 50 mL
  Filled 2015-08-02: qty 50

## 2015-08-02 MED ORDER — ACETAMINOPHEN 650 MG RE SUPP: 650.0000 mg | Freq: Four times a day (QID) | RECTAL | Status: DC | PRN

## 2015-08-02 NOTE — ED Notes (Addendum)
[facility]- pt coming from home, lives with family with altered mental status. Pt was LSN at bedtime last night at 2130. Pt is forgetful at baseline but able to carry on conversation. Pt confused this morning. CBG low with EMS. Pt has hx of stroke and is confined to bed at home.

## 2015-08-02 NOTE — Progress Notes (Signed)
EEG Tech talked to the nurse. Pt not feeling well and is ok to do the EEG tomorrow

## 2015-08-02 NOTE — ED Notes (Signed)
Charted incorrectly for the azactam due later today.   Called pharmacy and they advised fine to leave documentation as not given.  Didn't interfere with someone else's entry.

## 2015-08-02 NOTE — ED Notes (Signed)
Pt CBG, 33. Nurse was notified.

## 2015-08-02 NOTE — ED Notes (Signed)
Ultrasound guided IV to be attempted by Lexine Baton, RN.   Once IV established, vanc will be started and patient will be going upstairs.   Jerene Pitch and [place], RN will take patient to floor.   Report already called.  MD at bedside and still evaluating patient.  Will have to wait until MD is out of room.

## 2015-08-02 NOTE — H&P (Signed)
Date: 08/02/2015               Patient Name:  Angelica Beck MRN: [id number]  DOB: [birth year] Age / Sex: 64 y.o., female   PCP: Delia Chimes, NP           Medical Service: Internal Medicine Teaching Service         Attending Physician: Dr. Aldine Contes, MD    First Contact: Dr. Marlowe Sax, MD Pager: [phone]  Second Contact: Dr. Genene Churn, MD Pager: [phone]       After Hours (After 5p/  First Contact Pager: [phone]  weekends / holidays): Second Contact Pager: [phone]    Most Recent Discharge Date:  02/10/15  Chief Complaint:  Chief Complaint  Patient presents with  . Altered Mental Status       History of Present Illness:  Angelica Beck is a 64 y.o. female who has a past medical history of Hypertension; Stroke ([place]); Arthritis; Diabetes mellitus, type 2 ([place]); Hyperlipidemia; Hyperthyroidism; and Obesity hypoventilation syndrome ([place]) (06/20/2013).    Pt presents to the ED with altered mental status.  Most of the history is provided by the daughter as the patient is altered.  [name] (pt's daughter), reports pt was previously fine yesterday except for a non-productive cough on Saturday.  States patient woke up this AM and was confused and didn't know where she was or what was going on around her.  She called everyone "[name]."  She also appeared very anxious and restless.  Patient lives with daughter and states pt is normally only able to wash her face but is dependent on her daughter for her ADLs.  She suffered a stroke previously and has experienced difficulty with short term memory for several months now.  They have an aide who is with her from 10am-5pm.  Also have a hospital bed and ambulates with a walker normally.  Denies any pain except for abdominal pain when questioned.  Daughter reports patient has been taking all medications and denies any sick contacts.  She has no h/o seizures but does report sometimes pt is has daily starring episodes.  She is incontinent and has not had a BM for  about 4 days.  Has h/o UTI.  Daughter does endorse pt sometimes "spits up" and had an episode of vomiting on Saturday.  Pt has been eating and drinking normally although she has lost some weight.  Denies current tobacco, alcohol, or recreational drug use.  Daughter does report pt c/o hurting between her thighs.    In the ED, cbg was found to be 33 and lactic acid 10.89.  Vital signs were stable and not febrile.  Had a mild leukocytosis of 11.6.  Also with AKI and SCr of  6.06 (baseline unknown) with SCr of 1.5 during last admission in May 2016.  She was fluid resuscitated in the ED given 2.5L of NS and an amp of D50.  CXR showed mild central pulmonary vascular congestion and interstitial edema.  No PNA.  CT head with atrophy and a degree of demyelination.  No acute hemorrhage or infarct.  KUB showed large stool burden without other acute findings.  UA with few squames and many bacteria.     Meds: Current Facility-Administered Medications  Medication Dose Route Frequency Provider Last Rate Last Dose  . 0.9 %  sodium chloride infusion   Intravenous Continuous Jones Bales, MD 75 mL/hr at 08/02/15 1325    . acetaminophen (TYLENOL) tablet 650 mg  650 mg Oral Q6H  PRN Jones Bales, MD       Or  . acetaminophen (TYLENOL) suppository 650 mg  650 mg Rectal Q6H PRN Jones Bales, MD      . aztreonam (AZACTAM) 500 mg in dextrose 5 % 50 mL IVPB  500 mg Intravenous 3 times per day Horatio Pel, RPH   0 mg at 08/02/15 1125  . clopidogrel (PLAVIX) tablet 75 mg  75 mg Oral Daily Jones Bales, MD   75 mg at 08/02/15 1430  . famotidine (PEPCID) tablet 10 mg  10 mg Oral BID Jones Bales, MD   10 mg at 08/02/15 1430  . gabapentin (NEURONTIN) capsule 100 mg  100 mg Oral QHS Jones Bales, MD      . heparin injection 5,000 Units  5,000 Units Subcutaneous 3 times per day Jones Bales, MD      . insulin aspart (novoLOG) injection 0-9 Units  0-9 Units Subcutaneous TID WC Jones Bales, MD       . Derrill Memo ON 08/04/2015] levofloxacin (LEVAQUIN) IVPB 500 mg  500 mg Intravenous Q48H Cassie L Stewart, RPH      . metoprolol succinate (TOPROL-XL) 24 hr tablet 50 mg  50 mg Oral Daily Jones Bales, MD      . multivitamin with minerals tablet 1 tablet  1 tablet Oral Daily Jones Bales, MD   1 tablet at 08/02/15 1430  . niacin (NIASPAN) CR tablet 1,000 mg  1,000 mg Oral QHS Jones Bales, MD      . nitroGLYCERIN (NITROSTAT) SL tablet 0.4 mg  0.4 mg Sublingual Q5 min PRN Jones Bales, MD      . propylthiouracil (PTU) tablet 50 mg  50 mg Oral BID Jones Bales, MD   50 mg at 08/02/15 1430  . rosuvastatin (CRESTOR) tablet 40 mg  40 mg Oral QHS Jones Bales, MD      . senna-docusate (Senokot-S) tablet 1 tablet  1 tablet Oral QHS PRN Jones Bales, MD      . vancomycin (VANCOCIN) 2,000 mg in sodium chloride 0.9 % 500 mL IVPB  2,000 mg Intravenous Once Cassie Jodean Lima, RPH 250 mL/hr at 08/02/15 1222 2,000 mg at 08/02/15 1222    Prescriptions prior to admission  Medication Sig Dispense Refill Last Dose  . aspirin EC 81 MG tablet Take 1 tablet (81 mg total) by mouth daily. 30 tablet 0 08/01/2015 at Unknown time  . baclofen (LIORESAL) 10 MG tablet Take 10 mg by mouth 3 (three) times daily as needed. Muscle spasms   Past Week at Unknown time  . Cholecalciferol (VITAMIN D) 2000 UNITS tablet Take 2,000 Units by mouth daily.   08/01/2015 at Unknown time  . clopidogrel (PLAVIX) 75 MG tablet Take 75 mg by mouth daily.    08/01/2015 at Unknown time  . diphenhydramine-acetaminophen (TYLENOL PM) 25-500 MG TABS Take 1 tablet by mouth at bedtime as needed (sleep).   Past Week at Unknown time  . enalapril (VASOTEC) 20 MG tablet Take 20 mg by mouth daily.    08/01/2015 at Unknown time  . furosemide (LASIX) 40 MG tablet Take 40 mg by mouth every morning.   08/01/2015 at Unknown time  . gabapentin (NEURONTIN) 100 MG capsule Take 100 mg by mouth at bedtime.    08/01/2015 at Unknown time  . insulin  aspart (NOVOLOG) 100 UNIT/ML FlexPen Inject 15 Units into the skin as needed for high blood sugar (per sliding  scale).    Past Month at Unknown time  . insulin glargine (LANTUS) 100 unit/mL SOPN Inject 23 Units into the skin at bedtime.   08/01/2015 at Unknown time  . insulin lispro (HUMALOG) 100 UNIT/ML KiwkPen Inject 15 Units into the skin 3 (three) times daily as needed (CBG 200 or more).   Past Week at Unknown time  . meloxicam (MOBIC) 15 MG tablet Take 15 mg by mouth daily.   08/01/2015 at Unknown time  . metFORMIN (GLUCOPHAGE) 1000 MG tablet Take 1,000 mg by mouth 2 (two) times daily.   08/01/2015 at Unknown time  . metoprolol succinate (TOPROL-XL) 50 MG 24 hr tablet Take 50 mg by mouth daily.    08/01/2015 at 1000  . niacin (NIASPAN) 1000 MG CR tablet Take 1,000 mg by mouth at bedtime.    08/01/2015 at Unknown time  . nitroGLYCERIN (NITROSTAT) 0.4 MG SL tablet Place 1 tablet (0.4 mg total) under the tongue every 5 (five) minutes as needed for chest pain. 30 tablet 0 unk at unk  . oxybutynin (DITROPAN-XL) 10 MG 24 hr tablet Take 10 mg by mouth daily.    08/01/2015 at Unknown time  . propylthiouracil (PTU) 50 MG tablet Take 50 mg by mouth 2 (two) times daily.    08/01/2015 at Unknown time  . ranitidine (ZANTAC) 150 MG tablet Take 150 mg by mouth 2 (two) times daily.    08/01/2015 at Unknown time  . rosuvastatin (CRESTOR) 40 MG tablet Take 40 mg by mouth at bedtime.   08/01/2015 at Unknown time  . traMADol (ULTRAM) 50 MG tablet Take 50 mg by mouth every 6 (six) hours as needed (pain).    Past Week at Unknown time  . traZODone (DESYREL) 50 MG tablet Take 50 mg by mouth at bedtime as needed for sleep.    Past Week at Unknown time  . albuterol (PROVENTIL HFA;VENTOLIN HFA) 108 (90 BASE) MCG/ACT inhaler Inhale 2 puffs into the lungs every 6 (six) hours as needed for wheezing or shortness of breath.   rescue at rescue    Allergies: Allergies as of 08/02/2015 - Review Complete 08/02/2015  Allergen Reaction  Noted  . Penicillins Swelling 06/28/2009    PMH: Past Medical History  Diagnosis Date  . Hypertension   . Stroke ([place])   . Arthritis   . Diabetes mellitus, type 2 ([place])   . Hyperlipidemia   . Hyperthyroidism   . Obesity hypoventilation syndrome ([place]) 06/20/2013    PSH: Past Surgical History  Procedure Laterality Date  . Ventral hernia repair      FH: Family History  Problem Relation Age of Onset  . Diabetes Mother     SH: Social History  Substance Use Topics  . Smoking status: Former Smoker -- 0.25 packs/day for 30 years    Types: Cigarettes    Quit date: 09/25/2002  . Smokeless tobacco: Never Used  . Alcohol Use: No    Review of Systems: Pertinent items are noted in HPI.  Physical Exam: BP 149/85 mmHg  Pulse 88  Temp(Src) 98.3 F (36.8 C) (Oral)  Resp 18  Wt 240 lb (108.863 kg)  SpO2 97%  Physical Exam  Constitutional: Vital signs reviewed.  Patient is a well-developed and well-nourished female who appears anxious and fidgety.  Difficult to follow commands.  Head: Normocephalic and atraumatic Eyes: EOMI, conjunctivae normal. Neck: Supple, Trachea midline .  Cardiovascular: RRR, no MRG Pulmonary/Chest: normal respiratory effort, CTAB although difficult to assess d/t pt cooperation Abdominal: Soft.  Diffuse tenderness, Non-distended, hypoactive bowel sounds.  No rebound or guarding.  Neurological: A&O x2, difficult to assess d/t pt cooperation.  Did not know the year.  Skin: Warm, dry and intact.   Lab results:  Basic Metabolic Panel:  Recent Labs  08/02/15 0810  NA 139  K 5.0  CL 96*  CO2 15*  GLUCOSE 165*  BUN 43*  CREATININE 6.06*  CALCIUM 9.6    Calcium/Magnesium/Phosphorus:  Recent Labs Lab 08/02/15 0810  CALCIUM 9.6    Liver Function Tests: No results for input(s): AST, ALT, ALKPHOS, BILITOT, PROT, ALBUMIN in the last 72 hours. No results for input(s): LIPASE, AMYLASE in the last 72 hours. No results for input(s): AMMONIA in  the last 72 hours.  CBC: Lab Results  Component Value Date   WBC 11.6* 08/02/2015   HGB 10.7* 08/02/2015   HCT 34.3* 08/02/2015   MCV 80.0 08/02/2015   PLT 237 08/02/2015    Lipase: Lab Results  Component Value Date   LIPASE 16 06/24/2010    Lactic Acid/Procalcitonin:  Recent Labs Lab 08/02/15 0823 08/02/15 1145  LATICACIDVEN 10.89* 6.00*    Cardiac Enzymes:  Recent Labs  08/02/15 0834  TROPIPOC 0.01   Lab Results  Component Value Date   CKTOTAL 68 06/25/2010   CKMB 2.3 06/25/2010   TROPONINI <0.30 01/20/2014    BNP: No results for input(s): PROBNP in the last 72 hours.  D-Dimer: No results for input(s): DDIMER in the last 72 hours.  CBG:  Recent Labs  08/02/15 0736 08/02/15 0811  GLUCAP 33* 152*    Hemoglobin A1C: No results for input(s): HGBA1C in the last 72 hours.  Lipid Panel: No results for input(s): CHOL, HDL, LDLCALC, TRIG, CHOLHDL, LDLDIRECT in the last 72 hours.  Thyroid Function Tests:  Recent Labs  08/02/15 1150  TSH 0.158*    Anemia Panel: No results for input(s): VITAMINB12, FOLATE, FERRITIN, TIBC, IRON, RETICCTPCT in the last 72 hours.  Coagulation: No results for input(s): LABPROT, INR in the last 72 hours.  Urine Drug Screen: Drugs of Abuse:     Component Value Date/Time   LABOPIA NONE DETECTED 08/02/2015 0935   COCAINSCRNUR NONE DETECTED 08/02/2015 0935   LABBENZ NONE DETECTED 08/02/2015 0935   AMPHETMU NONE DETECTED 08/02/2015 0935   THCU NONE DETECTED 08/02/2015 0935   LABBARB NONE DETECTED 08/02/2015 0935    Alcohol Level: No results for input(s): ETH in the last 72 hours.  Urinalysis:    Component Value Date/Time   COLORURINE YELLOW 08/02/2015 0935   APPEARANCEUR CLOUDY* 08/02/2015 0935   LABSPEC 1.012 08/02/2015 0935   PHURINE 5.5 08/02/2015 0935   GLUCOSEU NEGATIVE 08/02/2015 0935   HGBUR LARGE* 08/02/2015 0935   BILIRUBINUR NEGATIVE 08/02/2015 0935   KETONESUR 40* 08/02/2015 0935   PROTEINUR  30* 08/02/2015 0935   UROBILINOGEN 0.2 08/02/2015 0935   NITRITE NEGATIVE 08/02/2015 0935   LEUKOCYTESUR NEGATIVE 08/02/2015 0935    Imaging results:  Dg Chest 2 View  08/02/2015  CLINICAL DATA:  Altered mental status, hypoglycemia, coronary artery disease EXAM: CHEST  2 VIEW COMPARISON:  PA and lateral chest x-ray of Feb 08, 2015 FINDINGS: The lungs are borderline hypoinflated. The interstitial markings are coarse. The central pulmonary vascularity is mildly prominent. The cardiac silhouette is top-normal in size. There is no pleural effusion or alveolar infiltrate. There is mild multilevel degenerative disc disease of the thoracic spine. IMPRESSION: Mild central pulmonary vascular congestion and interstitial edema accentuated by borderline hypo inflation. There is no  alveolar pneumonia nor pleural effusion. Electronically Signed   By: David  Martinique M.D.   On: 08/02/2015 08:35   Dg Abd 1 View  08/02/2015  CLINICAL DATA:  Altered mental status, abdominal pain EXAM: ABDOMEN - 1 VIEW COMPARISON:  06/25/2010 FINDINGS: Large calcified fibroid in the right pelvis. Large stool burden throughout the colon. No obstruction or free air. No organomegaly or acute bony abnormality. IMPRESSION: Calcified fibroids. Large stool burden. No acute findings. Electronically Signed   By: Rolm Baptise M.D.   On: 08/02/2015 13:29   Ct Head Wo Contrast  08/02/2015  CLINICAL DATA:  Altered mental status EXAM: CT HEAD WITHOUT CONTRAST TECHNIQUE: Contiguous axial images were obtained from the base of the skull through the vertex without intravenous contrast. COMPARISON:  Head CT July 03, 2009 and brain MRI July 06, 2009 FINDINGS: Mild generalized atrophy remain stable. There is no intracranial mass, hemorrhage, extra-axial fluid collection, or midline shift. There is decreased attenuation throughout the centra semiovale bilaterally. There are multiple foci of decreased attenuation in the periventricular white matter which  are oriented somewhat perpendicular to the lateral ventricles. There is evidence of a stable prior infarct at the gray -white junction of the anterior left parietal lobe. There are foci of decreased attenuation in both internal capsules, stable. Decreased attenuation is also noted in the left thalamus and in the mid pontine region, more on the right than on the left. There is decreased attenuation in the periphery of the left cerebellum, mid to superior aspect, stable. No acute infarct is appreciable. The bony calvarium appears intact. The mastoid air cells are clear. There is cerumen in the right external auditory canal. IMPRESSION: Atrophy with widespread abnormal attenuation in the supratentorial white matter as well as in the lateral left cerebellum and mid pontine region, more severe on the right than on the left. Prior infarct in the anterior left parietal lobe. All of these changes may be due to small vessel vascular disease. Given that several areas of decreased attenuation in the periventricular white matter are oriented perpendicular to the lateral ventricles, a degree of superimposed demyelination cannot be excluded. The overall appearance is essentially stable compared to the prior studies without demonstrable acute infarct. No acute hemorrhage or mass. No extra-axial fluid. Cerumen noted in the right external auditory canal. Electronically Signed   By: Lowella Grip III M.D.   On: 08/02/2015 09:24    EKG: EKG Interpretation  Date/Time:  Monday August 02 2015 08:04:29 EST Ventricular Rate:  109 PR Interval:  186 QRS Duration: 96 QT Interval:  356 QTC Calculation: 479 R Axis:   -15 Text Interpretation:  Sinus tachycardia Borderline left axis deviation Borderline low voltage, extremity leads No significant change since last tracing Confirmed by LITTLE MD, RACHEL [phone]) on 08/02/2015 8:13:25 AM   Antibiotics: Antibiotics Given (last 72 hours)    None      Anti-infectives     Start     Dose/Rate Route Frequency Ordered Stop   08/04/15 0800  levofloxacin (LEVAQUIN) IVPB 500 mg     500 mg 100 mL/hr over 60 Minutes Intravenous Every 48 hours 08/02/15 0917     08/02/15 1730  aztreonam (AZACTAM) 500 mg in dextrose 5 % 50 mL IVPB     500 mg 100 mL/hr over 30 Minutes Intravenous 3 times per day 08/02/15 0917     08/02/15 1015  vancomycin (VANCOCIN) IVPB 1000 mg/200 mL premix  Status:  Discontinued     1,000 mg 200 mL/hr  over 60 Minutes Intravenous  Once 08/02/15 0905 08/02/15 0910   08/02/15 0915  vancomycin (VANCOCIN) 2,000 mg in sodium chloride 0.9 % 500 mL IVPB     2,000 mg 250 mL/hr over 120 Minutes Intravenous  Once 08/02/15 0910     08/02/15 0830  levofloxacin (LEVAQUIN) IVPB 750 mg     750 mg 100 mL/hr over 90 Minutes Intravenous  Once 08/02/15 0828 08/02/15 1032   08/02/15 0830  aztreonam (AZACTAM) 2 g in dextrose 5 % 50 mL IVPB     2 g 100 mL/hr over 30 Minutes Intravenous  Once 08/02/15 0828 08/02/15 1129   08/02/15 0830  vancomycin (VANCOCIN) IVPB 1000 mg/200 mL premix  Status:  Discontinued     1,000 mg 200 mL/hr over 60 Minutes Intravenous  Once 08/02/15 0828 08/02/15 0910      Consults:    Assessment & Plan by Problem: Principal Problem:   Acute encephalopathy Active Problems:   HYPERTENSION, BENIGN   CAD, NATIVE VESSEL   Obesity hypoventilation syndrome (H[facility])   AKI (acute kidney injury) ([place])   Hyperthyroidism   Type 2 diabetes mellitus (H[facility])   Lactic acidosis   History of CVA (cerebrovascular accident)   Chronic systolic HF (heart failure) ([place])  Acute encephalopathy  Pt woke up with AMS this AM.  Daughter reports pt was previously fine yesterday but this morning did not know where she was, who she was, etc.  She also appeared very anxious and was thrashing about.  CBG in ED found to be 33 and was given an amp of D50 with CBG now 152.  CT head with atrophy and widespread abnormal attenuation with prior infarct in anterior left parietal  lobe.  Also with a degree of superimposed demyelination but stable compared to prior.  No acute infarct/hemorrhage.  Previous MRI (2010) showed R pontine subacute infarct with remote lacunar infarcts.  Also showed extensive periventricular and subcortical white matter changes which could possibly be seen with chronic microvascular disease or a demyelinating process but favored to represent ischemic.  Acute encephalopathy likely multifactorial including infection with possible UTI given many bacteria on microscopic so treated with broad spectrum abx of aztreonam, levaquin, and vancomycin.  Other contributing factors include infection, CVA (CT not suggestive), seizures, uremia (SCr 6.06, BUN 43), hypoglycemia.   -UA/culture, BCx -UDS -TSH  -EEG  -has an allergy to penicillin-->cont current abx for now   Lactic acidosis Lactic acid 10.89 on arrival.  CBG found to be 33 on arrival.  Lactic acidosis likely d/t hypoglycemia, AKI, with metformin possibly contributing.  She was given 2.5L NS in the ED.  -recheck lactic acid -IVF 89m/h   Acute Kidney Injury on Chronic Kidney Disease   Today, SCr up to 6.06.  Baseline SCr unknown but last admission in May SCr was 1.5.  Unclear etiology but likely hypotension in the setting of sepsis and has continued mobic, enalapril, and lasix.   Lab Results  Component Value Date   CREATININE 6.06* 08/02/2015  -FEUrea  -d/c ACE-i, lasix, mobic -strict I/Os, daily weights -NS 784mhr -Renal function panel to monitor electolytes -avoid nephrotoxins -may need to consult renal   Hyperthyroidism -check TSH/FT4 -cont PTU   Hypertension  Stable.  -holding ACEi/lasix   Diabetes Mellitus II  Pt was on metformin and insulin at home.   Lab Results  Component Value Date   HGBA1C 8.6* 02/08/2015  -HA1C -d/c metformin -SSI-S -ac and hs cbg  Dyslipidemia  -cont statin  Deconditioning  -PT/OT  consult  FEN  Fluids-NS 81m/h Electrolytes-Replete as needed    Nutrition- Carb modified/Heart healthy; NPO for now   VTE prophylaxis  5000 Units Heparin SQ tid  Disposition Disposition deferred at this time, awaiting improvement of current medical problems. Anticipated discharge in approximately 1-2 day(s).     Emergency Contact Contact Information    Name RCalypsoDaughter [phone]       The patient does have a current PCP (Delia Chimes NP) and [facility].  Signed JJones Bales MD PGY-3, Internal Medicine Teaching Service 08/02/2015, 2:10 PM

## 2015-08-02 NOTE — Progress Notes (Signed)
ANTIBIOTIC CONSULT NOTE - INITIAL  Pharmacy Consult for vancomycin + aztreonam + levaquin Indication: sepsis  Allergies  Allergen Reactions  . Penicillins Swelling    Facial swelling    Vital Signs: Temp: 98.3 F (36.8 C) (11/07 0738) Temp Source: Oral (11/07 0738) BP: 165/105 mmHg (11/07 0738) Pulse Rate: 113 (11/07 0738) Intake/Output from previous day:   Intake/Output from this shift:    Labs:  Recent Labs  08/02/15 0810  WBC 11.6*  HGB 10.7*  PLT 237   CrCl cannot be calculated (Unknown ideal weight.). No results for input(s): VANCOTROUGH, VANCOPEAK, VANCORANDOM, GENTTROUGH, GENTPEAK, GENTRANDOM, TOBRATROUGH, TOBRAPEAK, TOBRARND, AMIKACINPEAK, AMIKACINTROU, AMIKACIN in the last 72 hours.   Microbiology: No results found for this or any previous visit (from the past 720 hour(s)).  Medical History: Past Medical History  Diagnosis Date  . Hypertension   . Stroke ([place])   . Arthritis   . Diabetes mellitus, type 2 ([place])   . Hyperlipidemia   . Hyperthyroidism   . Obesity hypoventilation syndrome ([place]) 06/20/2013   Assessment: 64 yo f presenting to the ED via EMS from home on 11/7 for AMS. Stat LA was 10.89, so patient was called a code sepsis.  Wbc 11.6, temp 98.3, SCr 6.06 (last one 1.56 in May), CrCl ~ 12 ml/min. Patient has received a 2 gm load of vanc, 750 mg load of levaquin, and a 2 gm load of aztreonam in the ED.   Will hold off on scheduling a vancomycin maintenance dose as the patient's SCr is nowhere near her baseline, and f/u with a SCr in the AM (patient likely would not receive another dose until 48hr anyway). Will schedule levaquin and aztreonam, but will monitor closely for changes needed in dosing.  Vanc 11/7 >> Aztreonam (PCN allergy) 11/7 >> Levaquin 11/7 >>  11/7 BCx: ordered 11/7 UCx: ordered  Goal of Therapy:  Vancomycin trough level 15-20 mcg/ml  Plan:  Hold off on scheduling vanc maintenance dose, f/u SCr in AM Aztreonam 500 mg IV  q8h Levaquin 500 mg IV q48h Monitor cx, renal function, CBC, clinical course De-escalate when clinically reasonable  Cassie L. Nicole Kindred, PharmD PGY2 Infectious Diseases Pharmacy Resident Pager: [phone] 08/02/2015 9:12 AM

## 2015-08-02 NOTE — ED Provider Notes (Signed)
CSN: [id number]     Arrival date & time 08/02/15  0725 History   First MD Initiated Contact with Patient 08/02/15 [phone]     Chief Complaint  Patient presents with  . Altered Mental Status     (Consider location/radiation/quality/duration/timing/severity/associated sxs/prior Treatment) HPI Comments: 64 year old female with past medical history including type 2 diabetes mellitus, CVA, hypertension, hyperlipidemia, obesity hypoventilation syndrome who presents with altered mental status. History limited due to the patient's altered mentation and obtained primarily from her daughter who assists with caregiving. Daughter states that the patient was normal throughout the day yesterday but when she woke up this morning she was altered, confused and unable to have a conversation which is very abnormal for her. Her blood glucose by EMS was noted to be 33. Daughter states that she does not normally have problems with hypoglycemia. No recent changes to her medicines. Daughter states that she was eating and drinking normally yesterday. No fevers, vomiting, cough/cold symptoms or recent illness.  Patient is a 64 y.o. female presenting with altered mental status. The history is provided by a relative.  Altered Mental Status   Past Medical History  Diagnosis Date  . Hypertension   . Stroke ([place])   . Arthritis   . Diabetes mellitus, type 2 ([place])   . Hyperlipidemia   . Hyperthyroidism   . Obesity hypoventilation syndrome ([place]) 06/20/2013   Past Surgical History  Procedure Laterality Date  . Ventral hernia repair     Family History  Problem Relation Age of Onset  . Diabetes Mother    Social History  Substance Use Topics  . Smoking status: Former Smoker -- 0.25 packs/day for 30 years    Types: Cigarettes    Quit date: 09/25/2002  . Smokeless tobacco: Never Used  . Alcohol Use: No   OB History    No data available     Review of Systems  Unable to perform ROS: Mental status change       Allergies  Penicillins  Home Medications   Prior to Admission medications   Medication Sig Start Date End Date Taking? Authorizing Provider  aspirin EC 81 MG tablet Take 1 tablet (81 mg total) by mouth daily. 01/21/14  Yes Belkys A Regalado, MD  baclofen (LIORESAL) 10 MG tablet Take 10 mg by mouth 3 (three) times daily as needed. Muscle spasms 07/23/15  Yes Historical Provider, MD  Cholecalciferol (VITAMIN D) 2000 UNITS tablet Take 2,000 Units by mouth daily.   Yes Historical Provider, MD  clopidogrel (PLAVIX) 75 MG tablet Take 75 mg by mouth daily.  06/05/13  Yes Historical Provider, MD  diphenhydramine-acetaminophen (TYLENOL PM) 25-500 MG TABS Take 1 tablet by mouth at bedtime as needed (sleep).   Yes Historical Provider, MD  enalapril (VASOTEC) 20 MG tablet Take 20 mg by mouth daily.  04/03/15  Yes Historical Provider, MD  furosemide (LASIX) 40 MG tablet Take 40 mg by mouth every morning. 07/23/15  Yes Historical Provider, MD  gabapentin (NEURONTIN) 100 MG capsule Take 100 mg by mouth at bedtime.  01/12/15  Yes Historical Provider, MD  insulin aspart (NOVOLOG) 100 UNIT/ML FlexPen Inject 15 Units into the skin as needed for high blood sugar (per sliding scale).    Yes Historical Provider, MD  insulin glargine (LANTUS) 100 unit/mL SOPN Inject 23 Units into the skin at bedtime.   Yes Historical Provider, MD  insulin lispro (HUMALOG) 100 UNIT/ML KiwkPen Inject 15 Units into the skin 3 (three) times daily as needed (CBG 200  or more).   Yes Historical Provider, MD  meloxicam (MOBIC) 15 MG tablet Take 15 mg by mouth daily. 07/23/15  Yes Historical Provider, MD  metFORMIN (GLUCOPHAGE) 1000 MG tablet Take 1,000 mg by mouth 2 (two) times daily. 07/23/15  Yes Historical Provider, MD  metoprolol succinate (TOPROL-XL) 50 MG 24 hr tablet Take 50 mg by mouth daily.  01/12/15  Yes Historical Provider, MD  niacin (NIASPAN) 1000 MG CR tablet Take 1,000 mg by mouth at bedtime.  01/12/15  Yes Historical  Provider, MD  nitroGLYCERIN (NITROSTAT) 0.4 MG SL tablet Place 1 tablet (0.4 mg total) under the tongue every 5 (five) minutes as needed for chest pain. 01/21/14  Yes Belkys A Regalado, MD  oxybutynin (DITROPAN-XL) 10 MG 24 hr tablet Take 10 mg by mouth daily.  01/12/15  Yes Historical Provider, MD  propylthiouracil (PTU) 50 MG tablet Take 50 mg by mouth 2 (two) times daily.  06/05/13  Yes Historical Provider, MD  ranitidine (ZANTAC) 150 MG tablet Take 150 mg by mouth 2 (two) times daily.  06/05/13  Yes Historical Provider, MD  rosuvastatin (CRESTOR) 40 MG tablet Take 40 mg by mouth at bedtime.   Yes Historical Provider, MD  traMADol (ULTRAM) 50 MG tablet Take 50 mg by mouth every 6 (six) hours as needed (pain).  01/12/15  Yes Historical Provider, MD  traZODone (DESYREL) 50 MG tablet Take 50 mg by mouth at bedtime as needed for sleep.  06/05/13  Yes Historical Provider, MD  albuterol (PROVENTIL HFA;VENTOLIN HFA) 108 (90 BASE) MCG/ACT inhaler Inhale 2 puffs into the lungs every 6 (six) hours as needed for wheezing or shortness of breath.    Historical Provider, MD   BP 149/85 mmHg  Pulse 88  Temp(Src) 98.3 F (36.8 C) (Oral)  Resp 18  Wt 240 lb (108.863 kg)  SpO2 97% Physical Exam  Constitutional:  Obese, chronically ill-appearing woman, awake but disoriented, in no acute distress  HENT:  Head: Normocephalic and atraumatic.  dry mucous membranes  Eyes: Conjunctivae are normal. Pupils are equal, round, and reactive to light.  Neck: Neck supple.  Cardiovascular: Regular rhythm and normal heart sounds.   No murmur heard. Tachycardic  Pulmonary/Chest: Effort normal and breath sounds normal. No respiratory distress.  Abdominal: Soft. Bowel sounds are normal. She exhibits no distension. There is no tenderness.  Musculoskeletal:  Trace bilateral lower extremity edema  Neurological:  Awake, disoriented, unable to answer questions but protecting airway, moving all 4 ext  Skin: Skin is warm and dry.   Psychiatric: She has a normal mood and affect. Judgment normal.  Nursing note and vitals reviewed.   ED Course  .Critical Care Performed by: Sharlett Iles Authorized by: Sharlett Iles Total critical care time: 45 minutes Critical care time was exclusive of separately billable procedures and treating other patients. Critical care was necessary to treat or prevent imminent or life-threatening deterioration of the following conditions: endocrine crisis and dehydration. Critical care was time spent personally by me on the following activities: development of treatment plan with patient or surrogate, evaluation of patient's response to treatment, examination of patient, obtaining history from patient or surrogate, ordering and performing treatments and interventions, ordering and review of laboratory studies, ordering and review of radiographic studies, re-evaluation of patient's condition and review of old charts.   (including critical care time) Labs Review Labs Reviewed  BASIC METABOLIC PANEL - Abnormal; Notable for the following:    Chloride 96 (*)    CO2 15 (*)  Glucose, Bld 165 (*)    BUN 43 (*)    Creatinine, Ser 6.06 (*)    GFR calc non Af Amer 7 (*)    GFR calc Af Amer 8 (*)    Anion gap 28 (*)    All other components within normal limits  CBC WITH DIFFERENTIAL/PLATELET - Abnormal; Notable for the following:    WBC 11.6 (*)    Hemoglobin 10.7 (*)    HCT 34.3 (*)    MCH 24.9 (*)    RDW 18.7 (*)    Neutro Abs 10.3 (*)    All other components within normal limits  URINALYSIS, ROUTINE W REFLEX MICROSCOPIC (NOT AT [facility]) - Abnormal; Notable for the following:    APPearance CLOUDY (*)    Hgb urine dipstick LARGE (*)    Ketones, ur 40 (*)    Protein, ur 30 (*)    All other components within normal limits  URINE MICROSCOPIC-ADD ON - Abnormal; Notable for the following:    Squamous Epithelial / LPF FEW (*)    Bacteria, UA MANY (*)    Casts GRANULAR CAST (*)     All other components within normal limits  CBG MONITORING, ED - Abnormal; Notable for the following:    Glucose-Capillary 33 (*)    All other components within normal limits  I-STAT CG4 LACTIC ACID, ED - Abnormal; Notable for the following:    Lactic Acid, Venous 10.89 (*)    All other components within normal limits  I-STAT VENOUS BLOOD GAS, ED - Abnormal; Notable for the following:    pH, Ven 7.331 (*)    pCO2, Ven 30.2 (*)    pO2, Ven 84.0 (*)    Bicarbonate 16.0 (*)    Acid-base deficit 9.0 (*)    All other components within normal limits  I-STAT CG4 LACTIC ACID, ED - Abnormal; Notable for the following:    Lactic Acid, Venous 6.00 (*)    All other components within normal limits  CBG MONITORING, ED - Abnormal; Notable for the following:    Glucose-Capillary 152 (*)    All other components within normal limits  URINE CULTURE  CULTURE, BLOOD (ROUTINE X 2)  CULTURE, BLOOD (ROUTINE X 2)  BLOOD GAS, VENOUS  TSH  URINE RAPID DRUG SCREEN, HOSP PERFORMED  HIV ANTIBODY (ROUTINE TESTING)  HEMOGLOBIN A1C  PROCALCITONIN  I-STAT TROPOININ, ED  I-STAT CG4 LACTIC ACID, ED  I-STAT CG4 LACTIC ACID, ED    Imaging Review Dg Chest 2 View  08/02/2015  CLINICAL DATA:  Altered mental status, hypoglycemia, coronary artery disease EXAM: CHEST  2 VIEW COMPARISON:  PA and lateral chest x-ray of Feb 08, 2015 FINDINGS: The lungs are borderline hypoinflated. The interstitial markings are coarse. The central pulmonary vascularity is mildly prominent. The cardiac silhouette is top-normal in size. There is no pleural effusion or alveolar infiltrate. There is mild multilevel degenerative disc disease of the thoracic spine. IMPRESSION: Mild central pulmonary vascular congestion and interstitial edema accentuated by borderline hypo inflation. There is no alveolar pneumonia nor pleural effusion. Electronically Signed   By: David  Martinique M.D.   On: 08/02/2015 08:35   Ct Head Wo Contrast  08/02/2015   CLINICAL DATA:  Altered mental status EXAM: CT HEAD WITHOUT CONTRAST TECHNIQUE: Contiguous axial images were obtained from the base of the skull through the vertex without intravenous contrast. COMPARISON:  Head CT July 03, 2009 and brain MRI July 06, 2009 FINDINGS: Mild generalized atrophy remain stable. There is no intracranial  mass, hemorrhage, extra-axial fluid collection, or midline shift. There is decreased attenuation throughout the centra semiovale bilaterally. There are multiple foci of decreased attenuation in the periventricular white matter which are oriented somewhat perpendicular to the lateral ventricles. There is evidence of a stable prior infarct at the gray -white junction of the anterior left parietal lobe. There are foci of decreased attenuation in both internal capsules, stable. Decreased attenuation is also noted in the left thalamus and in the mid pontine region, more on the right than on the left. There is decreased attenuation in the periphery of the left cerebellum, mid to superior aspect, stable. No acute infarct is appreciable. The bony calvarium appears intact. The mastoid air cells are clear. There is cerumen in the right external auditory canal. IMPRESSION: Atrophy with widespread abnormal attenuation in the supratentorial white matter as well as in the lateral left cerebellum and mid pontine region, more severe on the right than on the left. Prior infarct in the anterior left parietal lobe. All of these changes may be due to small vessel vascular disease. Given that several areas of decreased attenuation in the periventricular white matter are oriented perpendicular to the lateral ventricles, a degree of superimposed demyelination cannot be excluded. The overall appearance is essentially stable compared to the prior studies without demonstrable acute infarct. No acute hemorrhage or mass. No extra-axial fluid. Cerumen noted in the right external auditory canal. Electronically  Signed   By: Lowella Grip III M.D.   On: 08/02/2015 09:24   I have personally reviewed and evaluated these images and lab results as part of my medical decision-making.   EKG Interpretation   Date/Time:  Monday August 02 2015 08:04:29 EST Ventricular Rate:  109 PR Interval:  186 QRS Duration: 96 QT Interval:  356 QTC Calculation: 479 R Axis:   -15 Text Interpretation:  Sinus tachycardia Borderline left axis deviation  Borderline low voltage, extremity leads No significant change since last  tracing Confirmed by Jyden Kromer MD, [name] [phone]) on 08/02/2015 8:13:25 AM      MDM   Final diagnoses:  Lactic acidosis  Hypoglycemia  Altered mental status, unspecified altered mental status type  Acute kidney injury  64year-old female brought in from home for altered mental status noted by daughter this morning. On arrival by EMS, the patient was awake and disoriented, unable to follow directions or speak in sentences. Blood glucose was 33 on arrival. Gave the patient oral gel while awaiting IV access and then gave an amp of D50. Started a fluid bolus and obtained above lab work including lactate and VBG. Initial lactate was 10.89; given this lactic acidosis and hypoglycemia, initiated a sepsis protocol with broad-spectrum antibiotics including vancomycin, Levaquin, and aztreonam. Gave the patient a total of 3.5 L of fluid. Patient's heart rate improved after receiving fluid bolus and her mentation improved after glucose.   Cr elevated at 6, Bicarb 15, AG 28 likely 2/2 lactic acidosis. WBC 11.6. CXR showed vascular congestion but no acute infiltrate. Head CT showed small vessel disease vs demyelinating process, no hemorrhage. I spoke with internal medicine regarding patient's lactic acidosis, hypoglycemia, and AMS. Pt admitted to step down in guarded condition.  Sharlett Iles, MD 08/02/15 [phone]

## 2015-08-02 NOTE — ED Notes (Signed)
Patient gone to CT when I went in to give fluids.

## 2015-08-02 NOTE — ED Notes (Signed)
Lactic Acid 10.89, MD aware.

## 2015-08-02 NOTE — ED Notes (Signed)
Patient pulled out left wrist IV.   D/C'd the IV, unable to get another line at this time.   Still need to get lactic, phlebotomy on the way to draw.    Patient difficult stick.

## 2015-08-02 NOTE — ED Notes (Signed)
Verified with Philippa Chester, phlebotomy that blood gas, venous had been drawn.  He advised would click off.

## 2015-08-03 ENCOUNTER — Inpatient Hospital Stay (HOSPITAL_COMMUNITY): Payer: Medicare Other

## 2015-08-03 DIAGNOSIS — R4182 Altered mental status, unspecified: Secondary | ICD-10-CM | POA: Insufficient documentation

## 2015-08-03 LAB — COMPREHENSIVE METABOLIC PANEL
ALK PHOS: 42 U/L (ref 38–126)
ALT: 8 U/L — AB (ref 14–54)
AST: 10 U/L — ABNORMAL LOW (ref 15–41)
Albumin: 2.6 g/dL — ABNORMAL LOW (ref 3.5–5.0)
Anion gap: 14 (ref 5–15)
BILIRUBIN TOTAL: 0.8 mg/dL (ref 0.3–1.2)
BUN: 36 mg/dL — ABNORMAL HIGH (ref 6–20)
CALCIUM: 8.5 mg/dL — AB (ref 8.9–10.3)
CHLORIDE: 106 mmol/L (ref 101–111)
CO2: 21 mmol/L — ABNORMAL LOW (ref 22–32)
CREATININE: 5.59 mg/dL — AB (ref 0.44–1.00)
GFR, EST AFRICAN AMERICAN: 9 mL/min — AB (ref >60)
GFR, EST NON AFRICAN AMERICAN: 7 mL/min — AB (ref >60)
Glucose, Bld: 174 mg/dL — ABNORMAL HIGH (ref 65–99)
Potassium: 4 mmol/L (ref 3.5–5.1)
Sodium: 141 mmol/L (ref 135–145)
TOTAL PROTEIN: 6.3 g/dL — AB (ref 6.5–8.1)

## 2015-08-03 LAB — GLUCOSE, CAPILLARY
GLUCOSE-CAPILLARY: 157 mg/dL — AB (ref 65–99)
GLUCOSE-CAPILLARY: 149 mg/dL — AB (ref 65–99)
Glucose-Capillary: 157 mg/dL — ABNORMAL HIGH (ref 65–99)
Glucose-Capillary: 149 mg/dL — ABNORMAL HIGH (ref 65–99)
Glucose-Capillary: 140 mg/dL — ABNORMAL HIGH (ref 65–99)
Glucose-Capillary: 118 mg/dL — ABNORMAL HIGH (ref 65–99)
Glucose-Capillary: 152 mg/dL — ABNORMAL HIGH (ref 65–99)

## 2015-08-03 LAB — CBC
HEMATOCRIT: 28.9 % — AB (ref 36.0–46.0)
Hemoglobin: 8.9 g/dL — ABNORMAL LOW (ref 12.0–15.0)
MCH: 24.1 pg — AB (ref 26.0–34.0)
MCHC: 30.8 g/dL (ref 30.0–36.0)
MCV: 78.1 fL (ref 78.0–100.0)
PLATELETS: 209 10*3/uL (ref 150–400)
RBC: 3.7 MIL/uL — AB (ref 3.87–5.11)
RDW: 18.8 % — ABNORMAL HIGH (ref 11.5–15.5)
WBC: 9.1 10*3/uL (ref 4.0–10.5)

## 2015-08-03 LAB — HIV ANTIBODY (ROUTINE TESTING W REFLEX): HIV Screen 4th Generation wRfx: NONREACTIVE

## 2015-08-03 LAB — HEMOGLOBIN A1C
Hgb A1c MFr Bld: 6.3 % — ABNORMAL HIGH (ref 4.8–5.6)
Mean Plasma Glucose: 134 mg/dL

## 2015-08-03 MED ORDER — VANCOMYCIN HCL 10 G IV SOLR
1500.0000 mg | INTRAVENOUS | Status: DC
  Filled 2015-08-03: qty 1500

## 2015-08-03 MED ORDER — ATORVASTATIN CALCIUM 40 MG PO TABS
40.0000 mg | ORAL_TABLET | Freq: Every day | ORAL | Status: DC
  Filled 2015-08-03: qty 1

## 2015-08-03 MED ORDER — INFLUENZA VAC SPLIT QUAD 0.5 ML IM SUSY
0.5000 mL | PREFILLED_SYRINGE | INTRAMUSCULAR | Status: AC
  Administered 2015-08-04: 0.5 mL via INTRAMUSCULAR
  Filled 2015-08-03: qty 0.5

## 2015-08-03 MED ORDER — DEXTROSE-NACL 5-0.9 % IV SOLN
INTRAVENOUS | Status: AC
  Administered 2015-08-03: 22:00:00 via INTRAVENOUS

## 2015-08-03 MED ORDER — PROMETHAZINE HCL 25 MG/ML IJ SOLN
6.2500 mg | Freq: Three times a day (TID) | INTRAMUSCULAR | Status: DC | PRN
  Administered 2015-08-03: 6.25 mg via INTRAVENOUS
  Filled 2015-08-03: qty 1

## 2015-08-03 NOTE — Evaluation (Signed)
Physical Therapy Evaluation Patient Details Name: Angelica Beck MRN: [id number] DOB: 10-Jun-1951 Today's Date: 08/03/2015   History of Present Illness  64year-old female brought in from home for altered mental status noted by daughter.  CT showed changes likely 2/2 small vascular disease, no acute infarct.  PMH inlcudes stroke, arthritis, DM II, obesity hypoventilation syndrome.      Clinical Impression  Pt admitted with above diagnosis. Pt currently with functional limitations due to the deficits listed below (see PT Problem List). Angelica Beck reports her mother's last name as her own and is not oriented to time, place, or situation.  Choreic movements noticed in Bil UEs which pt's daughter, Angelica Beck, reports is not her baseline.  Pt would greatly benefit from CIR as goal is to return home and continue w/ [facility] aide and assist from daughter.  Mod>Max assist for bed mobility this session. Pt will benefit from skilled PT to increase their independence and safety with mobility to allow discharge to the venue listed below.      Follow Up Recommendations CIR;Supervision/Assistance - 24 hour    Equipment Recommendations  Other (comment) (TBD as pt progresses)    Recommendations for Other Services Rehab consult;OT consult;Speech consult     Precautions / Restrictions Precautions Precautions: Fall Restrictions Weight Bearing Restrictions: No      Mobility  Bed Mobility Overal bed mobility: Needs Assistance Bed Mobility: Supine to Sit;Sit to Supine     Supine to sit: Mod assist;HOB elevated Sit to supine: +2 for physical assistance;Max assist   General bed mobility comments: HHA assist for pulling up to sitting w/ pt pulling up w/ railing w/ Lt UE as well.  Use of bed pad to scoot pt to EOB.  +2 assist for returning back to supine w/ support provided to trunk and Bil LEs  Transfers                    Ambulation/Gait                Stairs            Wheelchair  Mobility    Modified Rankin (Stroke Patients Only)       Balance Overall balance assessment: Needs assistance Sitting-balance support: Bilateral upper extremity supported;Feet supported Sitting balance-Leahy Scale: Poor Sitting balance - Comments: Min>close min guard assist sitting EOB.  Pt sits for ~10 minutes, chorea noted in Bil UE and pt unstable.                                     Pertinent Vitals/Pain Pain Assessment: No/denies pain    Home Living Family/patient expects to be discharged to:: Inpatient rehab Living Arrangements: Children Available Help at Discharge: Family;Personal care attendant;Available PRN/intermittently Type of Home: House Home Access: Level entry     Home Layout: Two level;Able to live on main level with bedroom/bathroom Home Equipment: Gilford Rile - 2 wheels;Tub bench;Electric scooter;Bedside commode Additional Comments: Angelica Beck, pt's daughter, provided pt's information.  Angelica Beck providees pericare to pt in morning before going to work around [place] care attendant comes at [place] and stays til 5pm.  In the hours in between pt remains in bed where she either sleeps or watches TV.      Prior Function Level of Independence: Needs assistance   Gait / Transfers Assistance Needed: RW for mainly pivot transfers only.  Otherwise uses scooter.  ADL's / [employer]  Needed: Aide assists with bathing and dressing and tub transfer.  Over the past few weeks pt has needed assist w/ feeding.        Hand Dominance   Dominant Hand: Right    Extremity/Trunk Assessment   Upper Extremity Assessment: Generalized weakness;RUE deficits/detail;LUE deficits/detail RUE Deficits / Details: chorea present, not pt's baseline     LUE Deficits / Details: chorea present, not pt's baseline   Lower Extremity Assessment: Generalized weakness;RLE deficits/detail;LLE deficits/detail         Communication   Communication:  (dysarthria)  Cognition  Arousal/Alertness: Awake/alert Behavior During Therapy: Flat affect Overall Cognitive Status: Impaired/Different from baseline Area of Impairment: Orientation;Memory;Safety/judgement Orientation Level: Disoriented to;Person;Place;Time;Situation   Memory: Decreased short-term memory (this is pt's baseline)   Safety/Judgement: Decreased awareness of safety;Decreased awareness of deficits     General Comments: Pt follows commands well.  Pt reports her mother's last name as her own and says she was born in [place].  When asked where she is she says she is at her Daycare.  Pt does not go to adult daycare per pt's daughter.    General Comments General comments (skin integrity, edema, etc.): SpO2 drops to 82% on RA, chest congestion noted, RN notified.  Pt and pt's daughter express interest in CIR w/ goal of returning home.      Exercises        Assessment/Plan    PT Assessment Patient needs continued PT services  PT Diagnosis Difficulty walking;Abnormality of gait;Generalized weakness;Altered mental status   PT Problem List Decreased strength;Decreased range of motion;Decreased activity tolerance;Decreased balance;Decreased mobility;Decreased coordination;Decreased cognition;Decreased knowledge of use of DME;Decreased safety awareness;Decreased knowledge of precautions;Cardiopulmonary status limiting activity;Obesity  PT Treatment Interventions DME instruction;Gait training;Functional mobility training;Therapeutic activities;Therapeutic exercise;Balance training;Neuromuscular re-education;Cognitive remediation;Patient/family education;Wheelchair mobility training   PT Goals (Current goals can be found in the Care Plan section) Acute Rehab PT Goals Patient Stated Goal: pt unable to state PT Goal Formulation: With patient/family Time For Goal Achievement: 08/24/15 Potential to Achieve Goals: Good    Frequency Min 3X/week   Barriers to discharge        Co-evaluation                End of Session   Activity Tolerance: Patient tolerated treatment well Patient left: in bed;with call bell/phone within reach;with bed alarm set;with family/visitor present Nurse Communication: Mobility status;Precautions;Other (comment) (pt has wet bed pad)         Time: 1018-1050 PT Time Calculation (min) (ACUTE ONLY): 32 min   Charges:   PT Evaluation $Initial PT Evaluation Tier I: 1 Procedure PT Treatments $Therapeutic Activity: 8-22 mins   PT G CodesJoslyn Hy PT, DPT [phone] Pager: [phone] 08/03/2015, 11:04 AM

## 2015-08-03 NOTE — Clinical Documentation Improvement (Signed)
Internal Medicine  Can the diagnosis of CKD be further specified?   CKD Stage I - GFR greater than or equal to 90  CKD Stage II - GFR 60-89  CKD Stage III - GFR 30-59  CKD Stage IV - GFR 15-29  CKD Stage V - GFR < 15  ESRD (End Stage Renal Disease)  Other condition  Unable to clinically determine  Supporting Information : (As per notes) Acute Kidney Injury on Chronic Kidney Disease  Today, SCr up to 6.06. Baseline SCr unknown but last admission in May SCr was 1.5  Please update your documentation within the medical record to reflect your response to this query. Thank you.  Please exercise your independent, professional judgment when responding. A specific answer is not anticipated or expected.  Thank You, Alessandra Grout, RN, BSN, CCDS,Clinical Documentation Specialist:  [phone]  [phone]=[employer]- Health Information Management

## 2015-08-03 NOTE — Progress Notes (Signed)
EEG Completed; Results Pending  

## 2015-08-03 NOTE — Progress Notes (Signed)
Pt BP 175/96.  Paged MD to notify.  Informed to call back if pressures continue to increase.  Pt still confused and alert to self only, but will follow commands.  Will continue to monitor.

## 2015-08-03 NOTE — Progress Notes (Signed)
ANTIBIOTIC CONSULT NOTE - FOLLOW UP  Pharmacy Consult for vancomycin + aztreonam + levaquin Indication: rule out sepsis  Allergies  Allergen Reactions  . Penicillins Swelling    Facial swelling    Patient Measurements: Weight: 224 lb 10.4 oz (101.9 kg)   Vital Signs: Temp: 99.1 F (37.3 C) (11/08 0700) Temp Source: Axillary (11/08 0700) BP: 170/95 mmHg (11/08 0355) Pulse Rate: 82 (11/08 0355) Intake/Output from previous day: 11/07 0701 - 11/08 0700 In: 300 [I.V.:300] Out: -  Intake/Output from this shift:    Labs:  Recent Labs  08/02/15 0810 08/03/15 0326  WBC 11.6* 9.1  HGB 10.7* 8.9*  PLT 237 209  CREATININE 6.06* 5.59*   Estimated Creatinine Clearance: 12.8 mL/min (by C-G formula based on Cr of 5.59). No results for input(s): VANCOTROUGH, VANCOPEAK, VANCORANDOM, GENTTROUGH, GENTPEAK, GENTRANDOM, TOBRATROUGH, TOBRAPEAK, TOBRARND, AMIKACINPEAK, AMIKACINTROU, AMIKACIN in the last 72 hours.   Microbiology: Recent Results (from the past 720 hour(s))  MRSA PCR Screening     Status: None   Collection Time: 08/02/15  1:48 PM  Result Value Ref Range Status   MRSA by PCR NEGATIVE NEGATIVE Final    Comment:        The GeneXpert MRSA Assay (FDA approved for NASAL specimens only), is one component of a comprehensive MRSA colonization surveillance program. It is not intended to diagnose MRSA infection nor to guide or monitor treatment for MRSA infections.     Anti-infectives    Start     Dose/Rate Route Frequency Ordered Stop   08/04/15 0800  levofloxacin (LEVAQUIN) IVPB 500 mg     500 mg 100 mL/hr over 60 Minutes Intravenous Every 48 hours 08/02/15 0917     08/02/15 1730  aztreonam (AZACTAM) 500 mg in dextrose 5 % 50 mL IVPB     500 mg 100 mL/hr over 30 Minutes Intravenous 3 times per day 08/02/15 0917     08/02/15 1015  vancomycin (VANCOCIN) IVPB 1000 mg/200 mL premix  Status:  Discontinued     1,000 mg 200 mL/hr over 60 Minutes Intravenous  Once 08/02/15  0905 08/02/15 0910   08/02/15 0915  vancomycin (VANCOCIN) 2,000 mg in sodium chloride 0.9 % 500 mL IVPB     2,000 mg 250 mL/hr over 120 Minutes Intravenous  Once 08/02/15 0910 08/02/15 1422   08/02/15 0830  levofloxacin (LEVAQUIN) IVPB 750 mg     750 mg 100 mL/hr over 90 Minutes Intravenous  Once 08/02/15 0828 08/02/15 1032   08/02/15 0830  aztreonam (AZACTAM) 2 g in dextrose 5 % 50 mL IVPB     2 g 100 mL/hr over 30 Minutes Intravenous  Once 08/02/15 0828 08/02/15 1129   08/02/15 0830  vancomycin (VANCOCIN) IVPB 1000 mg/200 mL premix  Status:  Discontinued     1,000 mg 200 mL/hr over 60 Minutes Intravenous  Once 08/02/15 0828 08/02/15 0910      Assessment: 64 yo female with AMS and lactic acidosis and AKI on levaquin, azactam and vancomycin for r/o sepsis. WBC= 9.1, afebrile, SCr= 5.59 (trend down; SCr was 1.56 in May), CrCl ~ 10  Vanc 11/7 >>  Aztreonam (PCN allergy) 11/7 >>  Levaquin 11/7 >>   11/7 BCx x2:  11/7 UCx: ordered  Goal of Therapy:  Vancomycin trough level 15-20 mcg/ml  Plan:  -Continue vancomycin as '1500mg'$  IV q48hr (next dose 11/9 at 12pm) -Will follow renal function, cultures and clinical progress  Hildred Laser, Pharm D 08/03/2015 10:16 AM

## 2015-08-03 NOTE — Procedures (Signed)
ELECTROENCEPHALOGRAM REPORT   Patient: Angelica Beck       Room #: 1M40 EEG No. ID: [id number] Age: 64 y.o.        Sex: female Referring Physician: Dareen Piano Report Date:  08/03/2015        Interpreting Physician: Alexis Goodell  History: [name] is an 64 y.o. female with altered mental status  Medications:  Scheduled: . antiseptic oral rinse  7 mL Mouth Rinse BID  . atorvastatin  40 mg Oral q1800  . aztreonam  500 mg Intravenous 3 times per day  . bisacodyl  10 mg Rectal Daily  . clopidogrel  75 mg Oral Daily  . famotidine  10 mg Oral BID  . heparin  5,000 Units Subcutaneous 3 times per day  . [START ON 08/04/2015] Influenza vac split quadrivalent PF  0.5 mL Intramuscular Tomorrow-1000  . metoprolol succinate  50 mg Oral Daily  . multivitamin with minerals  1 tablet Oral Daily  . niacin  1,000 mg Oral QHS  . propylthiouracil  50 mg Oral BID  . [START ON 08/04/2015] vancomycin  1,500 mg Intravenous Q48H    Conditions of Recording:  This is a 16 channel EEG carried out with the patient in the drowsy and asleep state.  Description:  The patient is not fully awake during the recording for wakefulness to be evaluated.  The patient appears to be drowsy for the majority of the recording.  The background activity is poorly organize but continuous.  It consists of a diffusely distributed mixture of low to moderate voltage theta and delta activity.   The patient goes in to a light sleep with symmetrical sleep spindles, vertex central sharp transients and irregular slow activity.   No epileptiform activity is noted.   Hyperventilation and intermittent photic stimulation were not performed.   IMPRESSION: Normal electroencephalogram, drowsy and asleep. There are no focal lateralizing or epileptiform features.   Alexis Goodell, MD [facility] [phone] 08/03/2015, 5:39 PM

## 2015-08-03 NOTE — Progress Notes (Deleted)
Admit Complaint: AMS, sepsis  AC/Heme: Plt 198, Hgb 9.7. Subq heparin  ID: UTI. tmax 100.1, WBC 8.2, LA 3.4 > 6 > 1.9  11/7 vanc >> 11/9 11/7 aztreonam >>  11/7 BCx x2 > ng x 3d 11/7 UCx > klebsiella, pseudomonas fluorescens MRSA PCR neg  CV: HTN, HLD. HR 73, BP 180/98. CXR shows mildly worse CHF. Lipitor 40, metoprolol IV 5 mg q6h, niacin  Endo: T2DM, hyperthyroid. TSH 0.158, free T4 1.46, A1c 6.3. BG 130. propylthiouracil  GI / Nutrition: Dysphagia 2 diet. Albumin 2.6, AST/ALT/Tbili wnl. Pepcid, MVI, PRN Promethazine IV, PRN senokot  Neuro: hx CVA. AMS much improved today. Plavix, PRN APAP   Nephro: AKI d/t ATN from sepsis. SCr 6.06 > 5.36 > 4.01. CorCa 9.9, lytes wnl. D5-1/2 NS at 50/hr. UOP x5 counts  Pulm: Former smoker (0.25 pack/d x30 yr, quit '04). 92%/RA  PTA Medication Issues: insulin, metoprolol  Best Practices: SubQ heparin  Plan:  -change aztreonam to po cipro x7 days -advanced to dysphagia 2 diet -watch BG as now eating -may dc to inpt rehab today

## 2015-08-03 NOTE — Progress Notes (Addendum)
Subjective: Patient was seen and examined at bedside this am. She was AAOx1 (person only) and following commands. Patient's daughter was present at the bedside and stated patient's mental status has deteriorated from baseline, she is normally AAOx3 at home. However, daughter believes patient's mental status has improved slightly in comparison to yesterday. Patient was whispering/ speech not clear and daughter stated her speech is normally comprehensible at home.   Objective: Vital signs in last 24 hours: Filed Vitals:   08/03/15 0355 08/03/15 0500 08/03/15 0700 08/03/15 1145  BP: 170/95     Pulse: 82     Temp:   99.1 F (37.3 C) 97.9 F (36.6 C)  TempSrc:   Axillary Oral  Resp: 19     Weight:  224 lb 10.4 oz (101.9 kg)    SpO2: 92%      Weight change:   Intake/Output Summary (Last 24 hours) at 08/03/15 1437 Last data filed at 08/02/15 2200  Gross per 24 hour  Intake    300 ml  Output      0 ml  Net    300 ml   Physical Exam  Constitutional: She appears well-developed and well-nourished. No distress.  Cardiovascular: Normal rate, regular rhythm and intact distal pulses.   Pulmonary/Chest: Effort normal. No respiratory distress. She has no wheezes.  Abdominal: Soft. Bowel sounds are normal. She exhibits no distension. There is no tenderness.  Musculoskeletal: She exhibits no edema.  Neurological:  AAO x1 (person only) Following verbal commands  Skin: Skin is warm and dry.   Lab Results: Basic Metabolic Panel:  Recent Labs Lab 08/02/15 0810 08/03/15 0326  NA 139 141  K 5.0 4.0  CL 96* 106  CO2 15* 21*  GLUCOSE 165* 174*  BUN 43* 36*  CREATININE 6.06* 5.59*  CALCIUM 9.6 8.5*   Liver Function Tests:  Recent Labs Lab 08/03/15 0326  AST 10*  ALT 8*  ALKPHOS 42  BILITOT 0.8  PROT 6.3*  ALBUMIN 2.6*   CBC:  Recent Labs Lab 08/02/15 0810 08/03/15 0326  WBC 11.6* 9.1  NEUTROABS 10.3*  --   HGB 10.7* 8.9*  HCT 34.3* 28.9*  MCV 80.0 78.1  PLT 237 209    CBG:  Recent Labs Lab 08/02/15 1638 08/02/15 1939 08/02/15 2301 08/03/15 0355 08/03/15 0722 08/03/15 1134  GLUCAP 111* 108* 130* 157* 149* 157*   Hemoglobin A1C:  Recent Labs Lab 08/02/15 1150  HGBA1C 6.3*   Thyroid Function Tests:  Recent Labs Lab 08/02/15 1150 08/02/15 1443  TSH 0.158*  --   FREET4  --  1.46*   Urine Drug Screen: Drugs of Abuse     Component Value Date/Time   LABOPIA NONE DETECTED 08/02/2015 0935   COCAINSCRNUR NONE DETECTED 08/02/2015 0935   LABBENZ NONE DETECTED 08/02/2015 0935   AMPHETMU NONE DETECTED 08/02/2015 0935   THCU NONE DETECTED 08/02/2015 0935   LABBARB NONE DETECTED 08/02/2015 0935    Urinalysis:  Recent Labs Lab 08/02/15 0935  COLORURINE YELLOW  LABSPEC 1.012  PHURINE 5.5  GLUCOSEU NEGATIVE  HGBUR LARGE*  BILIRUBINUR NEGATIVE  KETONESUR 40*  PROTEINUR 30*  UROBILINOGEN 0.2  NITRITE NEGATIVE  LEUKOCYTESUR NEGATIVE   Micro Results: Recent Results (from the past 240 hour(s))  Blood Culture (routine x 2)     Status: None (Preliminary result)   Collection Time: 08/02/15  8:45 AM  Result Value Ref Range Status   Specimen Description BLOOD LEFT HAND  Final   Special Requests BOTTLES DRAWN AEROBIC ONLY  5CC  Final   Culture NO GROWTH 1 DAY  Final   Report Status PENDING  Incomplete  Blood Culture (routine x 2)     Status: None (Preliminary result)   Collection Time: 08/02/15  8:55 AM  Result Value Ref Range Status   Specimen Description BLOOD RIGHT HAND  Final   Special Requests BOTTLES DRAWN AEROBIC AND ANAEROBIC 5CC  Final   Culture NO GROWTH 1 DAY  Final   Report Status PENDING  Incomplete  Urine culture     Status: None (Preliminary result)   Collection Time: 08/02/15  9:35 AM  Result Value Ref Range Status   Specimen Description URINE, CATHETERIZED  Final   Special Requests Normal  Final   Culture CULTURE REINCUBATED FOR BETTER GROWTH  Final   Report Status PENDING  Incomplete  MRSA PCR Screening      Status: None   Collection Time: 08/02/15  1:48 PM  Result Value Ref Range Status   MRSA by PCR NEGATIVE NEGATIVE Final    Comment:        The GeneXpert MRSA Assay (FDA approved for NASAL specimens only), is one component of a comprehensive MRSA colonization surveillance program. It is not intended to diagnose MRSA infection nor to guide or monitor treatment for MRSA infections.    Studies/Results: Dg Chest 2 View  08/02/2015  CLINICAL DATA:  Altered mental status, hypoglycemia, coronary artery disease EXAM: CHEST  2 VIEW COMPARISON:  PA and lateral chest x-ray of Feb 08, 2015 FINDINGS: The lungs are borderline hypoinflated. The interstitial markings are coarse. The central pulmonary vascularity is mildly prominent. The cardiac silhouette is top-normal in size. There is no pleural effusion or alveolar infiltrate. There is mild multilevel degenerative disc disease of the thoracic spine. IMPRESSION: Mild central pulmonary vascular congestion and interstitial edema accentuated by borderline hypo inflation. There is no alveolar pneumonia nor pleural effusion. Electronically Signed   By: David  Martinique M.D.   On: 08/02/2015 08:35   Dg Abd 1 View  08/02/2015  CLINICAL DATA:  Altered mental status, abdominal pain EXAM: ABDOMEN - 1 VIEW COMPARISON:  06/25/2010 FINDINGS: Large calcified fibroid in the right pelvis. Large stool burden throughout the colon. No obstruction or free air. No organomegaly or acute bony abnormality. IMPRESSION: Calcified fibroids. Large stool burden. No acute findings. Electronically Signed   By: Rolm Baptise M.D.   On: 08/02/2015 13:29   Ct Head Wo Contrast  08/02/2015  CLINICAL DATA:  Altered mental status EXAM: CT HEAD WITHOUT CONTRAST TECHNIQUE: Contiguous axial images were obtained from the base of the skull through the vertex without intravenous contrast. COMPARISON:  Head CT July 03, 2009 and brain MRI July 06, 2009 FINDINGS: Mild generalized atrophy remain stable.  There is no intracranial mass, hemorrhage, extra-axial fluid collection, or midline shift. There is decreased attenuation throughout the centra semiovale bilaterally. There are multiple foci of decreased attenuation in the periventricular white matter which are oriented somewhat perpendicular to the lateral ventricles. There is evidence of a stable prior infarct at the gray -white junction of the anterior left parietal lobe. There are foci of decreased attenuation in both internal capsules, stable. Decreased attenuation is also noted in the left thalamus and in the mid pontine region, more on the right than on the left. There is decreased attenuation in the periphery of the left cerebellum, mid to superior aspect, stable. No acute infarct is appreciable. The bony calvarium appears intact. The mastoid air cells are clear. There is cerumen  in the right external auditory canal. IMPRESSION: Atrophy with widespread abnormal attenuation in the supratentorial white matter as well as in the lateral left cerebellum and mid pontine region, more severe on the right than on the left. Prior infarct in the anterior left parietal lobe. All of these changes may be due to small vessel vascular disease. Given that several areas of decreased attenuation in the periventricular white matter are oriented perpendicular to the lateral ventricles, a degree of superimposed demyelination cannot be excluded. The overall appearance is essentially stable compared to the prior studies without demonstrable acute infarct. No acute hemorrhage or mass. No extra-axial fluid. Cerumen noted in the right external auditory canal. Electronically Signed   By: Lowella Grip III M.D.   On: 08/02/2015 09:24   US Renal  08/03/2015  CLINICAL DATA:  Acute renal failure. EXAM: RENAL / URINARY TRACT ULTRASOUND COMPLETE COMPARISON:  None. FINDINGS: Right Kidney: Length: 12.5 cm. Echogenicity within normal limits. No mass or hydronephrosis visualized. Left  Kidney: Length: 12.5 cm. Echogenicity within normal limits. No hydronephrosis visualized. 2.9 cm simple cyst lower pole left kidney. Bladder: Appears normal for degree of bladder distention. IMPRESSION: 2.9 cm simple cyst lower pole left kidney. Exam otherwise unremarkable. Electronically Signed   By: Marcello Moores  Register   On: 08/03/2015 09:57   Medications: I have reviewed the patient's current medications. Scheduled Meds: . antiseptic oral rinse  7 mL Mouth Rinse BID  . atorvastatin  40 mg Oral q1800  . aztreonam  500 mg Intravenous 3 times per day  . bisacodyl  10 mg Rectal Daily  . clopidogrel  75 mg Oral Daily  . famotidine  10 mg Oral BID  . heparin  5,000 Units Subcutaneous 3 times per day  . [START ON 08/04/2015] Influenza vac split quadrivalent PF  0.5 mL Intramuscular Tomorrow-1000  . metoprolol succinate  50 mg Oral Daily  . multivitamin with minerals  1 tablet Oral Daily  . niacin  1,000 mg Oral QHS  . propylthiouracil  50 mg Oral BID  . [START ON 08/04/2015] vancomycin  1,500 mg Intravenous Q48H   Continuous Infusions:  PRN Meds:.acetaminophen **OR** acetaminophen, dextrose, nitroGLYCERIN, senna-docusate Assessment/Plan: Principal Problem:   Acute encephalopathy Active Problems:   HYPERTENSION, BENIGN   CAD, NATIVE VESSEL   Obesity hypoventilation syndrome (HCC)   AKI (acute kidney injury) ([place])   Hyperthyroidism   Type 2 diabetes mellitus (HCC)   Lactic acidosis   History of CVA (cerebrovascular accident)   Chronic systolic HF (heart failure) (HCC)   Cognitive impairment  Acute encephalopathy and Sepsis Patient is currently AAOx1 (person only) and following verbal commands. She is whispering and her speech is difficult to understand. As per daughter, patient is AAOx3 and her speech is comprehensible at home. Patient presented with a blood glucose of 33 yesterday. She was given D50 and hypoglycemia has resolved - CBG 157 today. Hypoglycemia likely due to sepsis, lactic  acid 10.89 on admission. Lactic acid has trended down to 1.9 now. WBC trending down from 11.6 yesterday to 9.1 today. Hgb down from 10.7 yesterday to 8.9 today likely dilutional, patient has been receiving IVF. Sepsis likely in the setting of UTI, UA yesterday showing many bacteria. Blood culture showing no growth in 1 day. Patient was started on broad spectrum antibiotic coverage with vancomycin and aztreonam. Renal US today showing a 2.9 cm simple cyst on the lower pole of the L kidney. No hydronephrosis seen. CT of head showing atrophy with widespread abnormal attenuation in  the supratentorial white matter as well as in the lateral left cerebellum and mid pontine region, more severe on the right than on the left. Prior infarct in the anterior left parietal lobe. A degree of superimposed demyelination cannot be excluded The overall appearance is essentially stable compared to the prior studies without demonstrable acute infarct. No acute hemorrhage or mass.  -EEG pending -Urine cx pending -F/u repeat blood cx -SLP eval  Hypoglycemia: resolved. Patient presented with CBG 33 yesterday. She was given D50, CBG 157 today. -Maintain adequate oral intake   Lactic acidosis: resolved. Lactic acid 10.89 on arrival in the setting of sepsis. Lactic acid 1.9 today.   AKI on CKD: Likely from ATN in the setting of sepsis and continued use of mobic, enalapril, and lasix. Scr improved to 5.59 today from 6.06 yesterday. Patient's BL SCr is unknown but SCr was 1.5 during admission in May 2016.  -Holding mobic, enalapril, and lasix. -Encourage po intake   Hyperthyroidism: TSH low, free T4 high. -continue PTU  HTN: holding home meds for now  DM: A1c 6.3. CBG 157 today.  -Holding metformin -SSI-S  HLD: continue Lipitor 40 mg daily   Deconditioning: PT suggesting inpatient rehab.   FEN Fluids - holding for now Electrolytes - replete as needed Nutrition - clear liquids diet for now. Pending SLP eval.  VTE  ppx: Heparin subcutaneous   Dispo: Disposition is deferred at this time, awaiting improvement of current medical problems.  Anticipated discharge in approximately 2-3 day(s).   The patient does have a current PCP Delia Chimes, NP) and does need an [facility] hospital follow-up appointment after discharge.  The patient does not have transportation limitations that hinder transportation to clinic appointments.  .Services Needed at time of discharge: Y = Yes, Blank = No PT:   OT:   RN:   Equipment:   Other:     LOS: 1 day   Shela Leff, MD 08/03/2015, 2:37 PM

## 2015-08-03 NOTE — Progress Notes (Addendum)
Patient seen and examined. Case d/w residents in detail.  HPI: 64 y/o female with PMH of CVA, HTN, DM, HLD, hyperthyroidism p/w AMS * 1 day. Patient unable to provide history. Daughter at bedside provided history. Patient was in her usual state of health the day PTA but yesterday she woke up confused and was not oriented to place/person/time. No fevers at home but patient did have an episode of vomiting 2 days PTA. No CP, no sob, no diarrhea, no syncope, no lightheadedness. In ED noted to have LA of 10 and was hypoglycemic to 33. Also found to have AKI with creatinine of 6.   Today per daughter she is more oriented but not at baseline. No new complaints.  Physical Exam: Gen: AAO*1, NAD CVS: RRR, normal heart sounds Lungs: CTA b/l Abd: soft, non tender, BS + Ext: no edema  Assessment and Plan:  64 y/o female p/w AMS and found to have hypotension, hypoglycemia and AKI with lactic acidosis of 10 likely secondary to sepsis. Ubncertain etiology of sepsis- possibly UTI. Will f/u blood cx and urine cx - NGTD. Will c/w vancomycin and aztreonam for broad coverage (patient allergic to PCN).  Patient with AKI on CKD likely ATN secondary to hypotension and sepsis. Mildly improved today. Will monitor. Would watch urine output. Hold NSAIDs, lasix and ACE-I in the setting of AKI.  Blood sugars and blood pressure have improved today. Will monitor. Awaiting SLP follow up

## 2015-08-04 ENCOUNTER — Inpatient Hospital Stay (HOSPITAL_COMMUNITY): Payer: Medicare Other

## 2015-08-04 ENCOUNTER — Encounter (HOSPITAL_COMMUNITY): Payer: Self-pay | Admitting: Radiology

## 2015-08-04 DIAGNOSIS — R4189 Other symptoms and signs involving cognitive functions and awareness: Secondary | ICD-10-CM

## 2015-08-04 DIAGNOSIS — Z8673 Personal history of transient ischemic attack (TIA), and cerebral infarction without residual deficits: Secondary | ICD-10-CM

## 2015-08-04 DIAGNOSIS — G9340 Encephalopathy, unspecified: Secondary | ICD-10-CM

## 2015-08-04 DIAGNOSIS — A419 Sepsis, unspecified organism: Secondary | ICD-10-CM | POA: Diagnosis not present

## 2015-08-04 DIAGNOSIS — N179 Acute kidney failure, unspecified: Secondary | ICD-10-CM

## 2015-08-04 LAB — RENAL FUNCTION PANEL
ANION GAP: 12 (ref 5–15)
Albumin: 2.4 g/dL — ABNORMAL LOW (ref 3.5–5.0)
BUN: 32 mg/dL — ABNORMAL HIGH (ref 6–20)
CHLORIDE: 111 mmol/L (ref 101–111)
CO2: 22 mmol/L (ref 22–32)
Calcium: 8.5 mg/dL — ABNORMAL LOW (ref 8.9–10.3)
Creatinine, Ser: 5.36 mg/dL — ABNORMAL HIGH (ref 0.44–1.00)
GFR calc non Af Amer: 8 mL/min — ABNORMAL LOW (ref >60)
GFR, EST AFRICAN AMERICAN: 9 mL/min — AB (ref >60)
GLUCOSE: 163 mg/dL — AB (ref 65–99)
POTASSIUM: 3.5 mmol/L (ref 3.5–5.1)
Phosphorus: 4 mg/dL (ref 2.5–4.6)
Sodium: 145 mmol/L (ref 135–145)

## 2015-08-04 LAB — CBC
HCT: 28.7 % — ABNORMAL LOW (ref 36.0–46.0)
HEMOGLOBIN: 9 g/dL — AB (ref 12.0–15.0)
MCH: 24.3 pg — AB (ref 26.0–34.0)
MCHC: 31.4 g/dL (ref 30.0–36.0)
MCV: 77.6 fL — AB (ref 78.0–100.0)
Platelets: 214 10*3/uL (ref 150–400)
RBC: 3.7 MIL/uL — AB (ref 3.87–5.11)
RDW: 19 % — ABNORMAL HIGH (ref 11.5–15.5)
WBC: 8.6 10*3/uL (ref 4.0–10.5)

## 2015-08-04 LAB — GLUCOSE, CAPILLARY
GLUCOSE-CAPILLARY: 129 mg/dL — AB (ref 65–99)
GLUCOSE-CAPILLARY: 117 mg/dL — AB (ref 65–99)
Glucose-Capillary: 121 mg/dL — ABNORMAL HIGH (ref 65–99)
Glucose-Capillary: 137 mg/dL — ABNORMAL HIGH (ref 65–99)
Glucose-Capillary: 146 mg/dL — ABNORMAL HIGH (ref 65–99)
Glucose-Capillary: 156 mg/dL — ABNORMAL HIGH (ref 65–99)

## 2015-08-04 MED ORDER — CHLORHEXIDINE GLUCONATE 0.12 % MT SOLN
15.0000 mL | Freq: Two times a day (BID) | OROMUCOSAL | Status: DC
  Administered 2015-08-04 – 2015-08-06 (×5): 15 mL via OROMUCOSAL
  Filled 2015-08-04 (×5): qty 15

## 2015-08-04 MED ORDER — METOPROLOL TARTRATE 1 MG/ML IV SOLN
5.0000 mg | Freq: Four times a day (QID) | INTRAVENOUS | Status: DC
  Administered 2015-08-04 – 2015-08-06 (×9): 5 mg via INTRAVENOUS
  Filled 2015-08-04 (×7): qty 5

## 2015-08-04 MED ORDER — CHLORHEXIDINE GLUCONATE 0.12 % MT SOLN: 15.0000 mL | Freq: Two times a day (BID) | OROMUCOSAL | Status: DC

## 2015-08-04 MED ORDER — DEXTROSE-NACL 5-0.45 % IV SOLN
INTRAVENOUS | Status: AC
  Administered 2015-08-04: 09:00:00 via INTRAVENOUS

## 2015-08-04 MED ORDER — CETYLPYRIDINIUM CHLORIDE 0.05 % MT LIQD
7.0000 mL | Freq: Two times a day (BID) | OROMUCOSAL | Status: DC
  Administered 2015-08-04 – 2015-08-06 (×4): 7 mL via OROMUCOSAL

## 2015-08-04 NOTE — Progress Notes (Signed)
Physical Therapy Treatment Patient Details Name: Angelica Beck MRN: [id number] DOB: [birth year] Today's Date: 08/04/2015    History of Present Illness 64year-old female brought in from home for altered mental status noted by daughter.  CT showed changes likely 2/2 small vascular disease, no acute infarct.  PMH inlcudes stroke, arthritis, DM II, obesity hypoventilation syndrome.        PT Comments    Angelica Beck was very lethargic today which limited her session to sitting EOB.  Pt will benefit from continued skilled PT services to increase functional independence and safety.  Follow Up Recommendations  CIR;Supervision/Assistance - 24 hour     Equipment Recommendations  Other (comment) (TBD as pt progresses)    Recommendations for Other Services Rehab consult;OT consult;Speech consult     Precautions / Restrictions Precautions Precautions: Fall Restrictions Weight Bearing Restrictions: No    Mobility  Bed Mobility Overal bed mobility: Needs Assistance;+2 for physical assistance Bed Mobility: Supine to Sit;Sit to Supine     Supine to sit: Mod assist;HOB elevated;+2 for physical assistance Sit to supine: +2 for physical assistance;Max assist   General bed mobility comments: Assist supporting trunk and managing Bil LEs to achieve sitting EOB.  Use of bed pad to assist pt scootting to EOB.  Use of bed rail to pull up to sitting.  Transfers                 General transfer comment: not attempted this session 2/2 pt's lethargic state  Ambulation/Gait                 Stairs            Wheelchair Mobility    Modified Rankin (Stroke Patients Only)       Balance Overall balance assessment: Needs assistance Sitting-balance support: Bilateral upper extremity supported;Feet supported Sitting balance-Leahy Scale: Poor Sitting balance - Comments: Mod>close min guard assist sitting EOB.  Pt sits for ~5 mins but has difficulty keeping her eyes open and  fatigues quickly. RN made aware of pt's lethargic state                            Cognition Arousal/Alertness: Lethargic Behavior During Therapy: Flat affect Overall Cognitive Status: Impaired/Different from baseline Area of Impairment: Orientation;Memory;Safety/judgement Orientation Level: Disoriented to;Time;Situation   Memory: Decreased short-term memory (this is pt's baseline)   Safety/Judgement: Decreased awareness of safety;Decreased awareness of deficits     General Comments: Pt is very lethargic this session, had just returned from x-ray.  Keeps eyes closed throughout most of session.  Knows where she is and her name but not why she is here or the date.    Exercises      General Comments General comments (skin integrity, edema, etc.): SpO2 drop to 87% on RA sitting EOB, pt's continues to have what sounds like chest congestion.        Pertinent Vitals/Pain      Home Living                      Prior Function            PT Goals (current goals can now be found in the care plan section) Acute Rehab PT Goals Patient Stated Goal: pt unable to state PT Goal Formulation: With patient/family Time For Goal Achievement: 08/24/15 Potential to Achieve Goals: Good Progress towards PT goals: Progressing toward goals (very modestly)    Frequency  Min 3X/week    PT Plan Current plan remains appropriate    Co-evaluation             End of Session   Activity Tolerance: Patient limited by lethargy Patient left: in bed;with call bell/phone within reach;with bed alarm set     Time: 7366-8159 PT Time Calculation (min) (ACUTE ONLY): 19 min  Charges:  $Therapeutic Activity: 8-22 mins                    G Codes:      Joslyn Hy PT, [place] [phone] Pager: [phone] 08/04/2015, 3:29 PM

## 2015-08-04 NOTE — Progress Notes (Signed)
Received report from [facility]

## 2015-08-04 NOTE — Progress Notes (Signed)
Rehab admissions - Please see rehab consult done today by Dr. Letta Pate recommending MRI to rule out possible CVA.  Patient not able to fully participate yet due to lethargy.  I will follow patient for potential inpatient rehab admission.  Call me for questions.  #[phone]

## 2015-08-04 NOTE — Progress Notes (Signed)
Daughter [name] was called and informed of the patients transfer of room, was given new room number and was updated on patients condition.

## 2015-08-04 NOTE — Evaluation (Signed)
Clinical/Bedside Swallow Evaluation Patient Details  Name: Angelica Beck MRN: [id number] Date of Birth: [birth year]  Today's Date: 08/04/2015 Time: SLP Start Time (ACUTE ONLY): 0800 SLP Stop Time (ACUTE ONLY): 0815 SLP Time Calculation (min) (ACUTE ONLY): 15 min  Past Medical History:  Past Medical History  Diagnosis Date  . Hypertension   . Stroke ([place])   . Arthritis   . Diabetes mellitus, type 2 ([place])   . Hyperlipidemia   . Hyperthyroidism   . Obesity hypoventilation syndrome ([place]) 06/20/2013   Past Surgical History:  Past Surgical History  Procedure Laterality Date  . Ventral hernia repair     HPI:  Angelica Beck is a 64 y.o. female who has a PMH of HTN; CVA (LEFT parietal) ([place]); Arthritis; DM, type 2 ([place]); Hyperlipidemia; Hyperthyroidism; and Obesity hypoventilation syndrome ([place]), presented to [facility]/02/15 with AMS. Baseline dependent on daughter for ADLs, previous CVA resulted in short term memory deficits, daughter reports pt sometimes "spits up", episode of vomiting on Saturday. Currently being treated for possible UTI, acute kidney injury, CT indicated no acute infarcts, CXR mild central pulmonary vascular congestion and interstitial edema accentuated by borderline hypo inflation. There is no alveolar pneumonia nor pleural effusion. Not previously seen by SLP, currently NPO.   Assessment / Plan / Recommendation Clinical Impression  Pt demonstrated severe oral and oropharyngeal dysphagia with overt s/s of aspiration (immediate cough) due to AMS causing poor awareness of bolus. SLP provided total assist during po trials, with verbal and tactile cues to swallow. Pt orally held ice chips and puree, initiated a swallow with ice chips which resulted in an immediate wet cough. Pt did not initiate a swallow with puree, SLP suctioned bolus to remove it from oral cavity. Due to pt's AMS, SLP recommends pt remain NPO, nutrition and medication via alternate means. Pt's severity of  dysphagia is likely due to AMS and will improve as her mentation improves, SLP will follow with po trials to determine readiness for diet vs. objective swallow study.    Aspiration Risk  Severe    Diet Recommendation NPO;Alternative means - temporary   Medication Administration: Via alternative means    Other  Recommendations Oral Care Recommendations: Oral care QID   Follow Up Recommendations       Frequency and Duration min 2x/week  2 weeks   Pertinent Vitals/Pain NA    SLP Swallow Goals     Swallow Study Prior Functional Status       General Other Pertinent Information: Angelica Beck is a 64 y.o. female who has a PMH of HTN; CVA (LEFT parietal) ([place]); Arthritis; DM, type 2 ([place]); Hyperlipidemia; Hyperthyroidism; and Obesity hypoventilation syndrome ([place]), presented to [facility] 08/02/15 with AMS. Baseline dependent on daughter for ADLs, previous CVA resulted in short term memory deficits, daughter reports pt sometimes "spits up", episode of vomiting on Saturday. Currently being treated for possible UTI, acute kidney injury, CT indicated no acute infarcts, CXR mild central pulmonary vascular congestion and interstitial edema accentuated by borderline hypo inflation. There is no alveolar pneumonia nor pleural effusion. Not previously seen by SLP, currently NPO. Type of Study: Bedside swallow evaluation Diet Prior to this Study: NPO Temperature Spikes Noted: No Respiratory Status: Supplemental O2 delivered via (comment) History of Recent Intubation: No Behavior/Cognition: Cooperative;Alert;Requires cueing;Doesn't follow directions Oral Cavity - Dentition: Edentulous Self-Feeding Abilities: Total assist Patient Positioning: Upright in bed Baseline Vocal Quality: Low vocal intensity Volitional Cough: Cognitively unable to elicit Volitional Swallow: Unable to elicit    Oral/Motor/Sensory Function  Overall Oral Motor/Sensory Function: Impaired Labial ROM: Within Functional  Limits Labial Symmetry: Within Functional Limits Labial Strength: Reduced Lingual ROM: Within Functional Limits Lingual Symmetry: Within Functional Limits Lingual Strength: Reduced Facial ROM: Within Functional Limits Facial Symmetry: Within Functional Limits   Ice Chips Ice chips: Impaired Presentation: Spoon Oral Phase Impairments: Impaired anterior to posterior transit;Poor awareness of bolus Oral Phase Functional Implications: Oral holding Pharyngeal Phase Impairments: Cough - Immediate;Wet Vocal Quality   Thin Liquid Thin Liquid: Not tested    Nectar Thick Nectar Thick Liquid: Not tested   Honey Thick Honey Thick Liquid: Not tested   Puree Puree: Impaired Presentation: Spoon Oral Phase Impairments: Impaired mastication;Poor awareness of bolus;Impaired anterior to posterior transit Oral Phase Functional Implications: Right anterior spillage;Left anterior spillage;Oral holding   Solid   GO    Solid: Not tested      Lanier Ensign, Student-SLP  Lanier Ensign 08/04/2015,8:23 AM

## 2015-08-04 NOTE — Progress Notes (Signed)
Inpatient Rehabilitation  Patient was screened by Chelesea Weiand for appropriateness for an Inpatient Acute Rehab consult.  At this time, we are recommending Inpatient Rehab consult.  Please order when you feel appropriate.   Rochanda Harpham PT Inpatient Rehab Admissions Coordinator Cell [phone] Office [phone]   

## 2015-08-04 NOTE — Progress Notes (Signed)
Subjective: Patient was seen and examined at bedside this am. Her mentation has improved -AAOx2 now and continues to follow verbal commands. Her speech was more comprehensible. She denied having any CP, SOB, or abdominal pain. Denied having pain anywhere else. No complaints.    Objective: Vital signs in last 24 hours: Filed Vitals:   08/04/15 0331 08/04/15 0500 08/04/15 0706 08/04/15 0900  BP:   148/79 179/99  Pulse:   86 95  Temp: 98.3 F (36.8 C)  98.4 F (36.9 C)   TempSrc: Axillary  Axillary   Resp:   18 19  Weight:  225 lb 1.4 oz (102.1 kg)    SpO2:   91% 92%   Weight change: -14 lb 14.6 oz (-6.763 kg)  Intake/Output Summary (Last 24 hours) at 08/04/15 1135 Last data filed at 08/04/15 1000  Gross per 24 hour  Intake 1002.5 ml  Output      0 ml  Net 1002.5 ml   Physical Exam  Constitutional: She appears well-developed and well-nourished. No distress.  Cardiovascular: Normal rate, regular rhythm and intact distal pulses.   Pulmonary/Chest: Effort normal. No respiratory distress. She has no wheezes.  Abdominal: Soft. Bowel sounds are normal. She exhibits no distension. There is no tenderness.  Musculoskeletal: She exhibits no edema.  Neurological:  AAO x2  Following verbal commands Speech more comprehensible in comparison to yesterday.  Skin: Skin is warm and dry.   Lab Results: Basic Metabolic Panel:  Recent Labs Lab 08/03/15 0326 08/04/15 0338  NA 141 145  K 4.0 3.5  CL 106 111  CO2 21* 22  GLUCOSE 174* 163*  BUN 36* 32*  CREATININE 5.59* 5.36*  CALCIUM 8.5* 8.5*  PHOS  --  4.0   Liver Function Tests:  Recent Labs Lab 08/03/15 0326 08/04/15 0338  AST 10*  --   ALT 8*  --   ALKPHOS 42  --   BILITOT 0.8  --   PROT 6.3*  --   ALBUMIN 2.6* 2.4*   CBC:  Recent Labs Lab 08/02/15 0810 08/03/15 0326 08/04/15 0338  WBC 11.6* 9.1 8.6  NEUTROABS 10.3*  --   --   HGB 10.7* 8.9* 9.0*  HCT 34.3* 28.9* 28.7*  MCV 80.0 78.1 77.6*  PLT 237 209  214   CBG:  Recent Labs Lab 08/03/15 1516 08/03/15 1634 08/03/15 1943 08/03/15 2302 08/04/15 0338 08/04/15 0716  GLUCAP 140* 118* 149* 152* 156* 129*   Hemoglobin A1C:  Recent Labs Lab 08/02/15 1150  HGBA1C 6.3*   Thyroid Function Tests:  Recent Labs Lab 08/02/15 1150 08/02/15 1443  TSH 0.158*  --   FREET4  --  1.46*   Urine Drug Screen: Drugs of Abuse     Component Value Date/Time   LABOPIA NONE DETECTED 08/02/2015 0935   COCAINSCRNUR NONE DETECTED 08/02/2015 0935   LABBENZ NONE DETECTED 08/02/2015 0935   AMPHETMU NONE DETECTED 08/02/2015 0935   THCU NONE DETECTED 08/02/2015 0935   LABBARB NONE DETECTED 08/02/2015 0935    Urinalysis:  Recent Labs Lab 08/02/15 0935  COLORURINE YELLOW  LABSPEC 1.012  PHURINE 5.5  GLUCOSEU NEGATIVE  HGBUR LARGE*  BILIRUBINUR NEGATIVE  KETONESUR 40*  PROTEINUR 30*  UROBILINOGEN 0.2  NITRITE NEGATIVE  LEUKOCYTESUR NEGATIVE   Micro Results: Recent Results (from the past 240 hour(s))  Blood Culture (routine x 2)     Status: None (Preliminary result)   Collection Time: 08/02/15  8:45 AM  Result Value Ref Range Status   Specimen  Description BLOOD LEFT HAND  Final   Special Requests BOTTLES DRAWN AEROBIC ONLY 5CC  Final   Culture NO GROWTH 1 DAY  Final   Report Status PENDING  Incomplete  Blood Culture (routine x 2)     Status: None (Preliminary result)   Collection Time: 08/02/15  8:55 AM  Result Value Ref Range Status   Specimen Description BLOOD RIGHT HAND  Final   Special Requests BOTTLES DRAWN AEROBIC AND ANAEROBIC 5CC  Final   Culture NO GROWTH 1 DAY  Final   Report Status PENDING  Incomplete  Urine culture     Status: None (Preliminary result)   Collection Time: 08/02/15  9:35 AM  Result Value Ref Range Status   Specimen Description URINE, CATHETERIZED  Final   Special Requests Normal  Final   Culture   Final    >=100,000 COLONIES/mL GRAM NEGATIVE RODS CULTURE REINCUBATED FOR BETTER GROWTH    Report  Status PENDING  Incomplete  MRSA PCR Screening     Status: None   Collection Time: 08/02/15  1:48 PM  Result Value Ref Range Status   MRSA by PCR NEGATIVE NEGATIVE Final    Comment:        The GeneXpert MRSA Assay (FDA approved for NASAL specimens only), is one component of a comprehensive MRSA colonization surveillance program. It is not intended to diagnose MRSA infection nor to guide or monitor treatment for MRSA infections.    Studies/Results: Dg Abd 1 View  08/02/2015  CLINICAL DATA:  Altered mental status, abdominal pain EXAM: ABDOMEN - 1 VIEW COMPARISON:  06/25/2010 FINDINGS: Large calcified fibroid in the right pelvis. Large stool burden throughout the colon. No obstruction or free air. No organomegaly or acute bony abnormality. IMPRESSION: Calcified fibroids. Large stool burden. No acute findings. Electronically Signed   By: Rolm Baptise M.D.   On: 08/02/2015 13:29   US Renal  08/03/2015  CLINICAL DATA:  Acute renal failure. EXAM: RENAL / URINARY TRACT ULTRASOUND COMPLETE COMPARISON:  None. FINDINGS: Right Kidney: Length: 12.5 cm. Echogenicity within normal limits. No mass or hydronephrosis visualized. Left Kidney: Length: 12.5 cm. Echogenicity within normal limits. No hydronephrosis visualized. 2.9 cm simple cyst lower pole left kidney. Bladder: Appears normal for degree of bladder distention. IMPRESSION: 2.9 cm simple cyst lower pole left kidney. Exam otherwise unremarkable. Electronically Signed   By: Marcello Moores  Register   On: 08/03/2015 09:57   Medications: I have reviewed the patient's current medications. Scheduled Meds: . antiseptic oral rinse  7 mL Mouth Rinse q12n4p  . atorvastatin  40 mg Oral q1800  . aztreonam  500 mg Intravenous 3 times per day  . bisacodyl  10 mg Rectal Daily  . chlorhexidine  15 mL Mouth Rinse BID  . clopidogrel  75 mg Oral Daily  . famotidine  10 mg Oral BID  . heparin  5,000 Units Subcutaneous 3 times per day  . metoprolol  5 mg Intravenous 4  times per day  . multivitamin with minerals  1 tablet Oral Daily  . niacin  1,000 mg Oral QHS  . propylthiouracil  50 mg Oral BID   Continuous Infusions: . dextrose 5 % and 0.45% NaCl 50 mL/hr at 08/04/15 0857   PRN Meds:.acetaminophen **OR** acetaminophen, dextrose, nitroGLYCERIN, promethazine, senna-docusate Assessment/Plan: Principal Problem:   Acute encephalopathy Active Problems:   HYPERTENSION, BENIGN   CAD, NATIVE VESSEL   Obesity hypoventilation syndrome ([facility])   AKI (acute kidney injury) ([place])   Hyperthyroidism   Type 2 diabetes  mellitus ([facility])   Lactic acidosis   History of CVA (cerebrovascular accident)   Chronic systolic HF (heart failure) ([facility])   Cognitive impairment   Altered mental status  Acute encephalopathy and Sepsis 2/2 UTI Patient is currently AAOx2 and following verbal commands. Her speech is more comprehensible compared to yesterday. She denies having any complaints. Denies having any CP, SOB, or abdominal pain. Denies having pain anywhere else. Patient does have baseline decreased mental status. CT of head showing atrophy with widespread abnormal attenuation in the supratentorial white matter as well as in the lateral left cerebellum and mid pontine region, more severe on the right than on the left. Prior infarct in the anterior left parietal lobe. A degree of superimposed demyelination cannot be excluded The overall appearance is essentially stable compared to the prior studies without demonstrable acute infarct. No acute hemorrhage or mass. EEG was normal. However, sepsis and worsening mentation likely in the setting of UTI because urine culture growing >100,000 cfu of gram negative rods. Blood culture showing no growth in 1 day. Renal US today showing a 2.9 cm simple cyst on the lower pole of the L kidney. No hydronephrosis seen. Patient is currently afebrile, no leukocytosis but tachycardic (pulse 115). BP stable.  -Medsurge floor -Discontinue Vancomycin    -Continue Aztreonam -CXR to r/o aspiration pneumonia -F/u final blood cx results   Hypoglycemia: Resolved. Blood glucose stable now in the 150s.  -Continue IV dextrose 5%-1/2 NS because patient is NPO due to aspiration risk   Lactic acidosis: Resolved. Lactic acid 10.89 on arrival in the setting of sepsis. Lactic acid 1.9 yesterday.   AKI on CKD: Likely from ATN in the setting of sepsis/ hypotension and continued use of mobic, enalapril, and lasix. Scr improved to 5.36 today from peak 6.06. Patient's BL SCr is unknown but SCr was 1.5 during admission in May 2016.  -Holding NSAIDs, ACEi/ARB, and lasix. -Continue IVF hydration    Hyperthyroidism: TSH low, free T4 high. -continue PTU  HTN: BP 159/83 this morning. -Start Lopressor 6 mg IV q6  DM: A1c 6.3. CBG in the 150s. Patient is currently NPO due to aspiration risk.  -Holding metformin -SSI-S  Hx of CVA -Continue Plavix 75 mg daily   HLD: stable -continue Lipitor 40 mg daily  -continue Niacin '1000mg'$  daily   GERD: stable -Pepcid 10 mg BID  Deconditioning: PT suggesting inpatient rehab.   FEN Fluids - dextrose 5%-1/2 NS Electrolytes - replete as needed Nutrition - NPO  VTE ppx: Heparin subcutaneous   Dispo: Disposition is deferred at this time, awaiting improvement of current medical problems.  Anticipated discharge in approximately 2-3 day(s).   The patient does have a current PCP Delia Chimes, NP) and does need an [facility] hospital follow-up appointment after discharge.  The patient does not have transportation limitations that hinder transportation to clinic appointments.  .Services Needed at time of discharge: Y = Yes, Blank = No PT:   OT:   RN:   Equipment:   Other:     LOS: 2 days   Shela Leff, MD 08/04/2015, 11:35 AM

## 2015-08-04 NOTE — Consult Note (Signed)
Physical Medicine and Rehabilitation Consult Reason for Consult: Suspect CVA Referring Physician: Internal medicine   HPI: Angelica Beck is a 64 y.o. right handed female with history of hypertension, CVA with short-term memory deficits, diabetes mellitus peripheral neuropathy, hyperlipidemia, obesity hypoventilation syndrome, chronic renal insufficiency with creatinine 1.56 in May 2016. Patient lives with her daughter and has a home health aide 10 AM to 5 PM daily. Used a rolling walker mainly for pivot transfers prior to admission otherwise used a scooter. Aide assist with bathing and dressing and tub transfers. Over the past few weeks patient and needed assistance with feeding. Presents of 08/02/2015 with nonproductive cough, hypotension, altered mental status with anxiety and restlessness. Noted admission creatinine 6.06 from baseline 1.56 in May 2016. Cranial CT scan showed atrophy widespread abnormal attenuation in the supratentorial white matter as well as lateral left cerebellum and mid pontine region more severe on the right than left. Prior infarct in the anterior left parietal lobe. No acute changes. Chest x-ray negative pneumonia. Abdominal films no acute findings. Renal ultrasound unremarkable. EEG showed no seizure activity. Renal insufficiency felt to be likely ATN secondary to hypotension and possible sepsis. Maintain on Plavix for CVA prophylaxis as well as subcutaneous heparin for DVT prophylaxis. Current workup is ongoing. Physical therapy evaluation completed 08/03/2015 with the recommendations of physical medicine rehabilitation consult.  The patient is poorly responsive. She has an obvious right facial droop. She indicates she feels weaker on the right than on the left. She cannot tell me whether this is a new or Chronic issue  Review of Systems  Unable to perform ROS: mental acuity   Past Medical History  Diagnosis Date  . Hypertension   . Stroke ([place])   . Arthritis    . Diabetes mellitus, type 2 ([place])   . Hyperlipidemia   . Hyperthyroidism   . Obesity hypoventilation syndrome ([place]) 06/20/2013   Past Surgical History  Procedure Laterality Date  . Ventral hernia repair     Family History  Problem Relation Age of Onset  . Diabetes Mother    Social History:  reports that she quit smoking about 12 years ago. Her smoking use included Cigarettes. She has a 7.5 pack-year smoking history. She has never used smokeless tobacco. She reports that she does not drink alcohol or use illicit drugs. Allergies:  Allergies  Allergen Reactions  . Penicillins Swelling    Facial swelling   Medications Prior to Admission  Medication Sig Dispense Refill  . aspirin EC 81 MG tablet Take 1 tablet (81 mg total) by mouth daily. 30 tablet 0  . baclofen (LIORESAL) 10 MG tablet Take 10 mg by mouth 3 (three) times daily as needed. Muscle spasms    . Cholecalciferol (VITAMIN D) 2000 UNITS tablet Take 2,000 Units by mouth daily.    . clopidogrel (PLAVIX) 75 MG tablet Take 75 mg by mouth daily.     . diphenhydramine-acetaminophen (TYLENOL PM) 25-500 MG TABS Take 1 tablet by mouth at bedtime as needed (sleep).    . enalapril (VASOTEC) 20 MG tablet Take 20 mg by mouth daily.     . furosemide (LASIX) 40 MG tablet Take 40 mg by mouth every morning.    . gabapentin (NEURONTIN) 100 MG capsule Take 100 mg by mouth at bedtime.     . insulin aspart (NOVOLOG) 100 UNIT/ML FlexPen Inject 15 Units into the skin as needed for high blood sugar (per sliding scale).     . insulin glargine (  LANTUS) 100 unit/mL SOPN Inject 23 Units into the skin at bedtime.    . insulin lispro (HUMALOG) 100 UNIT/ML KiwkPen Inject 15 Units into the skin 3 (three) times daily as needed (CBG 200 or more).    . meloxicam (MOBIC) 15 MG tablet Take 15 mg by mouth daily.    . metFORMIN (GLUCOPHAGE) 1000 MG tablet Take 1,000 mg by mouth 2 (two) times daily.    . metoprolol succinate (TOPROL-XL) 50 MG 24 hr tablet Take 50  mg by mouth daily.     . niacin (NIASPAN) 1000 MG CR tablet Take 1,000 mg by mouth at bedtime.     . nitroGLYCERIN (NITROSTAT) 0.4 MG SL tablet Place 1 tablet (0.4 mg total) under the tongue every 5 (five) minutes as needed for chest pain. 30 tablet 0  . oxybutynin (DITROPAN-XL) 10 MG 24 hr tablet Take 10 mg by mouth daily.     Marland Kitchen propylthiouracil (PTU) 50 MG tablet Take 50 mg by mouth 2 (two) times daily.     . ranitidine (ZANTAC) 150 MG tablet Take 150 mg by mouth 2 (two) times daily.     . rosuvastatin (CRESTOR) 40 MG tablet Take 40 mg by mouth at bedtime.    . traMADol (ULTRAM) 50 MG tablet Take 50 mg by mouth every 6 (six) hours as needed (pain).     . traZODone (DESYREL) 50 MG tablet Take 50 mg by mouth at bedtime as needed for sleep.     Marland Kitchen albuterol (PROVENTIL HFA;VENTOLIN HFA) 108 (90 BASE) MCG/ACT inhaler Inhale 2 puffs into the lungs every 6 (six) hours as needed for wheezing or shortness of breath.      Home: Home Living Family/patient expects to be discharged to:: Inpatient rehab Living Arrangements: Children Available Help at Discharge: Family, Personal care attendant, Available PRN/intermittently Type of Home: House Home Access: Level entry Home Layout: Two level, Able to live on main level with bedroom/bathroom Alternate Level Stairs-Number of Steps: 1 flight Alternate Level Stairs-Rails: Left Home Equipment: Walker - 2 wheels, Tub bench, Electric scooter, Bedside commode Additional Comments: Angelica Beck, pt's daughter, provided pt's information.  Angelica Beck providees pericare to pt in morning before going to work around [place] care attendant comes at [place] and stays til 5pm.  In the hours in between pt remains in bed where she either sleeps or watches TV.    Functional History: Prior Function Level of Independence: Needs assistance Gait / Transfers Assistance Needed: RW for mainly pivot transfers only.  Otherwise uses scooter. ADL's / Homemaking Assistance Needed: Aide assists with  bathing and dressing and tub transfer.  Over the past few weeks pt has needed assist w/ feeding. Functional Status:  Mobility: Bed Mobility Overal bed mobility: Needs Assistance Bed Mobility: Supine to Sit, Sit to Supine Supine to sit: Mod assist, HOB elevated Sit to supine: +2 for physical assistance, Max assist General bed mobility comments: HHA assist for pulling up to sitting w/ pt pulling up w/ railing w/ Lt UE as well.  Use of bed pad to scoot pt to EOB.  +2 assist for returning back to supine w/ support provided to trunk and Bil LEs        ADL:    Cognition: Cognition Overall Cognitive Status: Impaired/Different from baseline Orientation Level: Oriented to person, Oriented to place, Disoriented to time, Disoriented to situation Cognition Arousal/Alertness: Awake/alert Behavior During Therapy: Flat affect Overall Cognitive Status: Impaired/Different from baseline Area of Impairment: Orientation, Memory, Safety/judgement Orientation Level: Disoriented to,  Person, Place, Time, Situation Memory: Decreased short-term memory (this is pt's baseline) Safety/Judgement: Decreased awareness of safety, Decreased awareness of deficits General Comments: Pt follows commands well.  Pt reports her mother's last name as her own and says she was born in [place].  When asked where she is she says she is at her Daycare.  Pt does not go to adult daycare per pt's daughter.  Blood pressure 179/99, pulse 95, temperature 98.3 F (36.8 C), temperature source Axillary, resp. rate 19, weight 102.1 kg (225 lb 1.4 oz), SpO2 92 %. Physical Exam  HENT:  Head: Normocephalic.  Eyes:  Pupils sluggish to light  Neck: Normal range of motion. Neck supple. No thyromegaly present.  Cardiovascular: Normal rate and regular rhythm.   Respiratory:  Decreased breath sounds at the bases  GI: Soft. Bowel sounds are normal. She exhibits no distension.  Neurological:  Patient is lethargic but arousable. She did  make eye contact. She was able to state her name. Speech was very dysarthric. Follows some inconsistent commands.  2 minus strength in the right deltoid biceps triceps 3 minus at the grip, 2 minus at the hip flexor knee extensor and ankle dorsal flexor bilaterally. 4 minus in the left deltoid, biceps, triceps, grip. Poor cooperation with manual muscle testing due to mental status Sensation withdraws to pinch in all 4 limbs but cannot do formal sensory testing due to mental status  Results for orders placed or performed during the hospital encounter of 08/02/15 (from the past 24 hour(s))  Glucose, capillary     Status: Abnormal   Collection Time: 08/03/15 11:34 AM  Result Value Ref Range   Glucose-Capillary 157 (H) 65 - 99 mg/dL   Comment 1 Notify RN    Comment 2 Document in Chart   Glucose, capillary     Status: Abnormal   Collection Time: 08/03/15  3:16 PM  Result Value Ref Range   Glucose-Capillary 140 (H) 65 - 99 mg/dL   Comment 1 Notify RN    Comment 2 Document in Chart   Glucose, capillary     Status: Abnormal   Collection Time: 08/03/15  4:34 PM  Result Value Ref Range   Glucose-Capillary 118 (H) 65 - 99 mg/dL   Comment 1 Notify RN    Comment 2 Document in Chart   Glucose, capillary     Status: Abnormal   Collection Time: 08/03/15  7:43 PM  Result Value Ref Range   Glucose-Capillary 149 (H) 65 - 99 mg/dL  Glucose, capillary     Status: Abnormal   Collection Time: 08/03/15 11:02 PM  Result Value Ref Range   Glucose-Capillary 152 (H) 65 - 99 mg/dL  CBC     Status: Abnormal   Collection Time: 08/04/15  3:38 AM  Result Value Ref Range   WBC 8.6 4.0 - 10.5 K/uL   RBC 3.70 (L) 3.87 - 5.11 MIL/uL   Hemoglobin 9.0 (L) 12.0 - 15.0 g/dL   HCT 28.7 (L) 36.0 - 46.0 %   MCV 77.6 (L) 78.0 - 100.0 fL   MCH 24.3 (L) 26.0 - 34.0 pg   MCHC 31.4 30.0 - 36.0 g/dL   RDW 19.0 (H) 11.5 - 15.5 %   Platelets 214 150 - 400 K/uL  Renal function panel     Status: Abnormal   Collection Time:  08/04/15  3:38 AM  Result Value Ref Range   Sodium 145 135 - 145 mmol/L   Potassium 3.5 3.5 - 5.1 mmol/L   Chloride  111 101 - 111 mmol/L   CO2 22 22 - 32 mmol/L   Glucose, Bld 163 (H) 65 - 99 mg/dL   BUN 32 (H) 6 - 20 mg/dL   Creatinine, Ser 5.36 (H) 0.44 - 1.00 mg/dL   Calcium 8.5 (L) 8.9 - 10.3 mg/dL   Phosphorus 4.0 2.5 - 4.6 mg/dL   Albumin 2.4 (L) 3.5 - 5.0 g/dL   GFR calc non Af Amer 8 (L) >60 mL/min   GFR calc Af Amer 9 (L) >60 mL/min   Anion gap 12 5 - 15  Glucose, capillary     Status: Abnormal   Collection Time: 08/04/15  3:38 AM  Result Value Ref Range   Glucose-Capillary 156 (H) 65 - 99 mg/dL  Glucose, capillary     Status: Abnormal   Collection Time: 08/04/15  7:16 AM  Result Value Ref Range   Glucose-Capillary 129 (H) 65 - 99 mg/dL   Comment 1 Notify RN    Comment 2 Document in Chart    Dg Abd 1 View  08/02/2015  CLINICAL DATA:  Altered mental status, abdominal pain EXAM: ABDOMEN - 1 VIEW COMPARISON:  06/25/2010 FINDINGS: Large calcified fibroid in the right pelvis. Large stool burden throughout the colon. No obstruction or free air. No organomegaly or acute bony abnormality. IMPRESSION: Calcified fibroids. Large stool burden. No acute findings. Electronically Signed   By: Rolm Baptise M.D.   On: 08/02/2015 13:29   US Renal  08/03/2015  CLINICAL DATA:  Acute renal failure. EXAM: RENAL / URINARY TRACT ULTRASOUND COMPLETE COMPARISON:  None. FINDINGS: Right Kidney: Length: 12.5 cm. Echogenicity within normal limits. No mass or hydronephrosis visualized. Left Kidney: Length: 12.5 cm. Echogenicity within normal limits. No hydronephrosis visualized. 2.9 cm simple cyst lower pole left kidney. Bladder: Appears normal for degree of bladder distention. IMPRESSION: 2.9 cm simple cyst lower pole left kidney. Exam otherwise unremarkable. Electronically Signed   By: Marcello Moores  Register   On: 08/03/2015 09:57    Assessment/Plan: Diagnosis: Right hemiparesis with chronic findings  apparent on CT scan, also with severe cognitive deficits, in a patient with history of CVA requiring caregiver assistance at home. 1. Does the need for close, 24 hr/day medical supervision in concert with the patient's rehab needs make it unreasonable for this patient to be served in a less intensive setting? Potentially 2. Co-Morbidities requiring supervision/potential complications: Acute renal failure, possible sepsis, Diabetes with peripheral neuropathy 3. Due to bladder management, bowel management, safety, skin/wound care, disease management, medication administration, pain management and patient education, does the patient require 24 hr/day rehab nursing? Yes 4. Does the patient require coordinated care of a physician, rehab nurse, PTOT speech to address physical and functional deficits in the context of the above medical diagnosis(es)? Potentially Addressing deficits in the following areas: balance, endurance, locomotion, strength, transferring, bowel/bladder control, bathing, dressing, feeding, grooming, toileting, cognition, speech and swallowing 5. Can the patient actively participate in an intensive therapy program of at least 3 hrs of therapy per day at least 5 days per week? No 6. The potential for patient to make measurable gains while on inpatient rehab is nnot applicable 7. Anticipated functional outcomes upon discharge from inpatient rehab are n/a  with PT, n/a with OT, n/a with SLP. 8. Estimated rehab length of stay to reach the above functional goals is: Not applicable 9. Does the patient have adequate social supports and living environment to accommodate these discharge functional goals? Potentially 10. Anticipated D/C setting: home versus SNF depending on  medical improvements 11. Anticipated post D/C treatments: hhome health therapy after SNF 12. Overall Rehab/Functional Prognosis: poor  RECOMMENDATIONS: This patient's condition is appropriate for continued rehabilitative care  in the following setting: patient  lacks the ability to participate in an intensive replantation program due to lethargy. Patient has agreed to participate in recommended program. N/A Note that insurance prior authorization may be required for reimbursement for recommended care.  Comment: Need to establish baseline functional status, question MRI to look for an acute infarct    08/04/2015

## 2015-08-04 NOTE — Progress Notes (Signed)
New Admission Note:  Arrival Method: bed from 3S Mental Orientation: Alert to self Telemetry: NSR Assessment: Completed Skin: TID on buttocks, MASD to left posterior inner thigh both treated with foam dressing IV: present Pain: 0 Tubes:0 Safety Measures: Safety Fall Prevention Plan was given, discussed and signed. Admission: Completed [facility] [place] Orientation: Patient has been orientated to the room, unit and the staff. Family: not present but updated  Orders have been reviewed and implemented. Will continue to monitor the patient. Call light has been placed within reach and bed alarm has been activated.   Fabian Sharp, RN  Phone Number: [phone]

## 2015-08-04 NOTE — Progress Notes (Signed)
Patient seen and examined. Case d/w residents in detail. I agree with findings and plan as documented in Dr. Elon Jester note.  Patient's mental status is slowly improving. No new complaints. Urine cx positive for gram negative rods. Will d/c vancomycin and c/w aztreonam. Will f/u speciation and sensitivities.Unlikely CVA as mentation has been improving and she has sepsis which is improving. Would consider MRI if she continues to have deficits despite improvement in sepsis  Patient with slowly improving creatinine. Follow strict I's and O's. Likely ATN secondary to hypotension and sepsis.   Patient remains NPO secondary to inability to swallow. C/w IVF. SLP following

## 2015-08-05 ENCOUNTER — Inpatient Hospital Stay (HOSPITAL_COMMUNITY): Payer: Medicare Other

## 2015-08-05 LAB — CBC
HEMATOCRIT: 31.2 % — AB (ref 36.0–46.0)
HEMOGLOBIN: 9.8 g/dL — AB (ref 12.0–15.0)
MCH: 24.3 pg — ABNORMAL LOW (ref 26.0–34.0)
MCHC: 31.4 g/dL (ref 30.0–36.0)
MCV: 77.2 fL — AB (ref 78.0–100.0)
Platelets: 199 10*3/uL (ref 150–400)
RBC: 4.04 MIL/uL (ref 3.87–5.11)
RDW: 19 % — ABNORMAL HIGH (ref 11.5–15.5)
WBC: 7.9 10*3/uL (ref 4.0–10.5)

## 2015-08-05 LAB — GLUCOSE, CAPILLARY
GLUCOSE-CAPILLARY: 115 mg/dL — AB (ref 65–99)
GLUCOSE-CAPILLARY: 124 mg/dL — AB (ref 65–99)
Glucose-Capillary: 120 mg/dL — ABNORMAL HIGH (ref 65–99)
Glucose-Capillary: 106 mg/dL — ABNORMAL HIGH (ref 65–99)
Glucose-Capillary: 120 mg/dL — ABNORMAL HIGH (ref 65–99)

## 2015-08-05 LAB — BASIC METABOLIC PANEL
ANION GAP: 10 (ref 5–15)
BUN: 26 mg/dL — ABNORMAL HIGH (ref 6–20)
CHLORIDE: 112 mmol/L — AB (ref 101–111)
CO2: 21 mmol/L — AB (ref 22–32)
Calcium: 8.8 mg/dL — ABNORMAL LOW (ref 8.9–10.3)
Creatinine, Ser: 4.7 mg/dL — ABNORMAL HIGH (ref 0.44–1.00)
GFR calc Af Amer: 11 mL/min — ABNORMAL LOW (ref >60)
GFR calc non Af Amer: 9 mL/min — ABNORMAL LOW (ref >60)
GLUCOSE: 118 mg/dL — AB (ref 65–99)
POTASSIUM: 3.5 mmol/L (ref 3.5–5.1)
Sodium: 143 mmol/L (ref 135–145)

## 2015-08-05 MED ORDER — DEXTROSE-NACL 5-0.45 % IV SOLN
INTRAVENOUS | Status: AC
  Administered 2015-08-05: 17:00:00 via INTRAVENOUS

## 2015-08-05 MED ORDER — METOPROLOL TARTRATE 1 MG/ML IV SOLN
INTRAVENOUS | Status: AC
  Filled 2015-08-05: qty 5

## 2015-08-05 NOTE — Progress Notes (Addendum)
Patient unable to clear secretions and patient audibly gurgling/struggling with secretions in throat. Patient unable to cough on command. Patient will open mouth to suction secretions, but they appear to be in throat and not in mouth. Yankauer and catheter used for oral suction, but unsuccessful. Patient appears uncomfortable and RN concern for aspiration. MD paged. Attempted to teach incentive spirometer, but patient unable to perform take a deep breath to use correctly.   Patient asked if the secretions in throat were making it hard to breath and she nodded yes. MD paged again. 7:37 AM   7:47 AM Second MD contact paged. Awaiting call back.   7:53 AM Patient was finally able to cough to help assist in bring up secretions into mouth and with oral suction copious thick white secretions suctioned. Patient sounds more clear. Patient nodded when asked if she was feeling better. Oral care provided. Still awaiting MD call

## 2015-08-05 NOTE — Care Management Note (Signed)
Case Management Note  Patient Details  Name: Angelica Beck MRN: [id number] Date of Birth: 01-17-1951  Subjective/Objective:  Transfer from sdu, patient is from home with daughter, per cir they will need pt to see patient again to determine if candidate for cir, this information was given to RN in progression, CSW is following for back up snf as well.  Patient having problems with swallowing, speech to do mbs.  , holding po's. NCM will cont to follow for dc needs.                  Action/Plan:   Expected Discharge Date:                  Expected Discharge Plan:  Skilled Nursing Facility  In-House Referral:  Clinical Social Work  Discharge planning Services  CM Consult  Post Acute Care Choice:    Choice offered to:     DME Arranged:    DME Agency:     HH Arranged:    [place] Agency:     Status of Service:  In process, will continue to follow  Medicare Important Message Given:  Yes Date Medicare IM Given:    Medicare IM give by:    Date Additional Medicare IM Given:    Additional Medicare Important Message give by:     If discussed at [place] of Stay Meetings, dates discussed:    Additional Comments:  Zenon Mayo, RN 08/05/2015, 4:36 PM

## 2015-08-05 NOTE — Clinical Social Work Placement (Signed)
   CLINICAL SOCIAL WORK PLACEMENT  NOTE  Date:  08/05/2015  Patient Details  Name: Angelica Beck MRN: [id number] Date of Birth: 03-10-1951  Clinical Social Work is seeking post-discharge placement for this patient at the [place] level of care (*CSW will initial, date and re-position this form in  chart as items are completed):  Yes   Patient/family provided with [place] Work Department's list of facilities offering this level of care within the geographic area requested by the patient (or if unable, by the patient's family).  Yes   Patient/family informed of their freedom to choose among providers that offer the needed level of care, that participate in Medicare, Medicaid or managed care program needed by the patient, have an available bed and are willing to accept the patient.  Yes   Patient/family informed of [facility]'s ownership interest in [facility] and [facility], as well as of the fact that they are under no obligation to receive care at these facilities.  PASRR submitted to EDS on 08/05/15     PASRR number received on 08/05/15     Existing PASRR number confirmed on       FL2 transmitted to all facilities in geographic area requested by pt/family on 08/05/15     FL2 transmitted to all facilities within larger geographic area on       Patient informed that his/her managed care company has contracts with or will negotiate with certain facilities, including the following:            Patient/family informed of bed offers received.  Patient chooses bed at       Physician recommends and patient chooses bed at      Patient to be transferred to   on  .  Patient to be transferred to facility by       Patient family notified on   of transfer.  Name of family member notified:        PHYSICIAN Please sign FL2     Additional Comment:    _______________________________________________ Cranford Mon, LCSW 08/05/2015,  2:09 PM

## 2015-08-05 NOTE — Care Management Important Message (Signed)
Important Message  Patient Details  Name: Angelica Beck MRN: [id number] Date of Birth: [birth year]   Medicare Important Message Given:  Yes    Keslyn Teater P Youssef Footman 08/05/2015, 3:32 PM

## 2015-08-05 NOTE — Progress Notes (Signed)
Speech Language Pathology Treatment: Dysphagia  Patient Details Name: Angelica Beck MRN: [id number] DOB: 05-31-1951 Today's Date: 08/05/2015 Time: 5797-2820 SLP Time Calculation (min) (ACUTE ONLY): 15 min  Assessment / Plan / Recommendation Clinical Impression  Prior to dysphagia treatment, pt had wet vocalizations and a congested cough, suspect pt is not managing secretions. SLP provided oral care and suctioning to clear thick and opaque secretions. Pt demonstrates severe oral dysphagia; oral holding, oral pocketing, and anterior spillage. Additionally, SLP suspects pt has delayed swallow initiation, reduced hyo-laryngeal elevation when palpating. While pt did not demonstrate overt s/s of aspiration, pt has a high risk of aspiration due to decreased management of secretions, suspected standing pharyngeal secretions, few vocalizations, and need for total assist when feeding. SLP will f/u with MBS same day to objectively evaluate swallow function, pt remain NPO until test.   HPI HPI: Angelica Beck is a 64 y.o. female who has a PMH of HTN; CVA (LEFT parietal) ([place]); Arthritis; DM, type 2 ([place]); Hyperlipidemia; Hyperthyroidism; and Obesity hypoventilation syndrome ([place]), [facility] 08/02/15 with AMS. Baseline dependent on daughter for ADLs, previous CVA resulted in short term memory deficits, daughter reports pt sometimes "spits up", episode of vomiting on Saturday. Currently being treated for possible UTI, acute kidney injury, CT indicated no acute infarcts, CXR mild central pulmonary vascular congestion and interstitial edema accentuated by borderline hypo inflation. There is no alveolar pneumonia nor pleural effusion. Not previously seen by SLP, currently NPO.   Pertinent Vitals Pain Assessment: Faces Faces Pain Scale: No hurt  SLP Plan  MBS    Recommendations Diet recommendations: NPO Medication Administration: Via alternative means              Oral Care Recommendations: Oral care  QID Plan: MBS    GO    Lanier Ensign, Student-SLP  Lanier Ensign 08/05/2015, 9:14 AM

## 2015-08-05 NOTE — Progress Notes (Signed)
Patient seen and examined. Case d/w residents in detail. I agree with findings and plan as documented in Dr. Elon Jester note.  Patient much more awake and oriented today. Urine cx + for klebsiella and pseudomonas. Would c/w aztreonam for now. Consider switching to PO cipro once tolerating PO. Blood cx remain negative.  AKI likely secondary to ATN from hypotension and sepsis. It continues to improve. Will monitor on IVF for now. Hold NSAIDs, ACE-I and lasix.   Patient remains NPO. Suspect that she will be able to swallow as her AMS resolves. SLP to follow

## 2015-08-05 NOTE — Progress Notes (Signed)
BP 180/90 on-call internal medicine paged.  Gerilyn Nestle MD, stated not concerned about BP d/t encephalopathy and pt's high BP trend.  No further orders given.  Will continue to monitor pt.

## 2015-08-05 NOTE — Clinical Social Work Note (Signed)
Clinical Social Work Assessment  Patient Details  Name: Angelica Beck MRN: [id number] Date of Birth: [birth year]  Date of referral:  08/05/15               Reason for consult:  Facility Placement                Permission sought to share information with:  Facility Sport and exercise psychologist, Family Supports Permission granted to share information::  Yes, Verbal Permission Granted  Name::     [name]  Agency::  [facility]  Relationship::  Daughter  Contact Information:     Housing/Transportation Living arrangements for the past 2 months:  Single Family Home Source of Information:  Adult Children Patient Interpreter Needed:  None Criminal Activity/Legal Involvement Pertinent to Current Situation/Hospitalization:  No - Comment as needed Significant Relationships:  Adult Children Lives with:  Adult Children Do you feel safe going back to the place where you live?  No Need for family participation in patient care:  Yes (Comment)  Care giving concerns:  Usually lives with daughter and uses a walker and a scooter for mobility assistance- currently dtr is unable to care for pt given current physical impairment   Social Worker assessment / plan: Pt was recommended for inpatient rehab. Spoke w/ pt's daughter concerning possibility of SNF if inpatient rehab is unable to admit.   Employment status:  Disabled (Comment on whether or not currently receiving Disability) Insurance information:  Medicare PT Recommendations:  Inpatient Rehab Consult Information / Referral to community resources:  [place]  Patient/Family's Response to care:  Pt's dtr was receptive to possibility of SNF. She recognizes the need for the pt to get increased independence before returning home.   Patient/Family's Understanding of and Emotional Response to Diagnosis, Current Treatment, and Prognosis:  Pt's dtr reports that pt is very motivated to return home before the holidays.   Emotional  Assessment Appearance:    Attitude/Demeanor/Rapport:  Unable to Assess Affect (typically observed):  Unable to Assess Orientation:  Oriented to Self Alcohol / Substance use:  Never Used Psych involvement (Current and /or in the community):  No (Comment)  Discharge Needs  Concerns to be addressed:  Care Coordination Readmission within the last 30 days:  No Current discharge risk:  Dependent with Mobility Barriers to Discharge:  Continued Medical Work up   [employer], LCSW 08/05/2015, 1:58 PM

## 2015-08-05 NOTE — Progress Notes (Signed)
Subjective: Patient was seen and examined at bedside this am. AAOx2. She is much more alert and talking more. She denies having any CP, SOB, or abdominal pain. Denies having pain anywhere else. No other complaints.    Objective: Vital signs in last 24 hours: Filed Vitals:   08/05/15 0503 08/05/15 0505 08/05/15 1116 08/05/15 1603  BP: 189/88 191/77 178/91 164/81  Pulse: 75 74 91 84  Temp:  98.5 F (36.9 C)  100.1 F (37.8 C)  TempSrc:  Oral  Oral  Resp: '18  17 16  '$ Weight:  225 lb 8.5 oz (102.3 kg)    SpO2: 100% 98%  97%   Weight change: 7.1 oz (0.2 kg)  Intake/Output Summary (Last 24 hours) at 08/05/15 1611 Last data filed at 08/05/15 1300  Gross per 24 hour  Intake    150 ml  Output      0 ml  Net    150 ml   Physical Exam  Constitutional: She appears well-developed and well-nourished. No distress.  Cardiovascular: Normal rate, regular rhythm and intact distal pulses.   Pulmonary/Chest: Effort normal. No respiratory distress. She has no wheezes.  Mild bibasilar rales   Abdominal: Soft. Bowel sounds are normal. She exhibits no distension. There is no tenderness.  Musculoskeletal: She exhibits no edema.  Neurological:  AAO x2  Following verbal commands Speech comprehensible   Skin: Skin is warm and dry.   Lab Results: Basic Metabolic Panel:  Recent Labs Lab 08/04/15 0338 08/05/15 0820  NA 145 143  K 3.5 3.5  CL 111 112*  CO2 22 21*  GLUCOSE 163* 118*  BUN 32* 26*  CREATININE 5.36* 4.70*  CALCIUM 8.5* 8.8*  PHOS 4.0  --    Liver Function Tests:  Recent Labs Lab 08/03/15 0326 08/04/15 0338  AST 10*  --   ALT 8*  --   ALKPHOS 42  --   BILITOT 0.8  --   PROT 6.3*  --   ALBUMIN 2.6* 2.4*   CBC:  Recent Labs Lab 08/02/15 0810  08/04/15 0338 08/05/15 0820  WBC 11.6*  < > 8.6 7.9  NEUTROABS 10.3*  --   --   --   HGB 10.7*  < > 9.0* 9.8*  HCT 34.3*  < > 28.7* 31.2*  MCV 80.0  < > 77.6* 77.2*  PLT 237  < > 214 199  < > = values in this  interval not displayed. CBG:  Recent Labs Lab 08/04/15 1739 08/04/15 2018 08/04/15 2353 08/05/15 0427 08/05/15 0800 08/05/15 1212  GLUCAP 121* 146* 137* 120* 106* 120*   Hemoglobin A1C:  Recent Labs Lab 08/02/15 1150  HGBA1C 6.3*   Thyroid Function Tests:  Recent Labs Lab 08/02/15 1150 08/02/15 1443  TSH 0.158*  --   FREET4  --  1.46*   Urine Drug Screen: Drugs of Abuse     Component Value Date/Time   LABOPIA NONE DETECTED 08/02/2015 0935   COCAINSCRNUR NONE DETECTED 08/02/2015 0935   LABBENZ NONE DETECTED 08/02/2015 0935   AMPHETMU NONE DETECTED 08/02/2015 0935   THCU NONE DETECTED 08/02/2015 0935   LABBARB NONE DETECTED 08/02/2015 0935    Urinalysis:  Recent Labs Lab 08/02/15 0935  COLORURINE YELLOW  LABSPEC 1.012  PHURINE 5.5  GLUCOSEU NEGATIVE  HGBUR LARGE*  BILIRUBINUR NEGATIVE  KETONESUR 40*  PROTEINUR 30*  UROBILINOGEN 0.2  NITRITE NEGATIVE  LEUKOCYTESUR NEGATIVE   Micro Results: Recent Results (from the past 240 hour(s))  Blood Culture (routine x 2)  Status: None (Preliminary result)   Collection Time: 08/02/15  8:45 AM  Result Value Ref Range Status   Specimen Description BLOOD LEFT HAND  Final   Special Requests BOTTLES DRAWN AEROBIC ONLY 5CC  Final   Culture NO GROWTH 3 DAYS  Final   Report Status PENDING  Incomplete  Blood Culture (routine x 2)     Status: None (Preliminary result)   Collection Time: 08/02/15  8:55 AM  Result Value Ref Range Status   Specimen Description BLOOD RIGHT HAND  Final   Special Requests BOTTLES DRAWN AEROBIC AND ANAEROBIC 5CC  Final   Culture NO GROWTH 3 DAYS  Final   Report Status PENDING  Incomplete  Urine culture     Status: None (Preliminary result)   Collection Time: 08/02/15  9:35 AM  Result Value Ref Range Status   Specimen Description URINE, CATHETERIZED  Final   Special Requests Normal  Final   Culture   Final    >=100,000 COLONIES/mL KLEBSIELLA PNEUMONIAE >=100,000 COLONIES/mL  PSEUDOMONAS FLUORESCENS SUSCEPTIBILITIES TO FOLLOW    Report Status PENDING  Incomplete   Organism ID, Bacteria KLEBSIELLA PNEUMONIAE  Final      Susceptibility   Klebsiella pneumoniae - MIC*    AMPICILLIN >=32 RESISTANT Resistant     CEFAZOLIN <=4 SENSITIVE Sensitive     CEFTRIAXONE <=1 SENSITIVE Sensitive     CIPROFLOXACIN <=0.25 SENSITIVE Sensitive     GENTAMICIN <=1 SENSITIVE Sensitive     IMIPENEM <=0.25 SENSITIVE Sensitive     NITROFURANTOIN 32 SENSITIVE Sensitive     TRIMETH/SULFA <=20 SENSITIVE Sensitive     AMPICILLIN/SULBACTAM 4 SENSITIVE Sensitive     PIP/TAZO <=4 SENSITIVE Sensitive     * >=100,000 COLONIES/mL KLEBSIELLA PNEUMONIAE  MRSA PCR Screening     Status: None   Collection Time: 08/02/15  1:48 PM  Result Value Ref Range Status   MRSA by PCR NEGATIVE NEGATIVE Final    Comment:        The GeneXpert MRSA Assay (FDA approved for NASAL specimens only), is one component of a comprehensive MRSA colonization surveillance program. It is not intended to diagnose MRSA infection nor to guide or monitor treatment for MRSA infections.    Studies/Results: Dg Chest 2 View  08/04/2015  CLINICAL DATA:  Cough, chest congestion EXAM: CHEST  2 VIEW COMPARISON:  08/02/15 FINDINGS: Stable cardiac enlargement and vascular congestion. Mild interstitial edema is mildly worse when compared with the prior study. No effusion. IMPRESSION: Mildly worse congestive heart failure Electronically Signed   By: Skipper Cliche M.D.   On: 08/04/2015 12:00   Dg Swallowing Func-speech Pathology  08/05/2015  Objective Swallowing Evaluation:   Patient Details Name: Jenniferlynn Saad MRN: [id number] Date of Birth: March 19, 1951 Today's Date: 08/05/2015 Time: SLP Start Time (ACUTE ONLY): 1015-SLP Stop Time (ACUTE ONLY): 1045 SLP Time Calculation (min) (ACUTE ONLY): 30 min Past Medical History: Past Medical History Diagnosis Date . Hypertension  . Stroke ([place])  . Arthritis  . Diabetes mellitus, type 2 ([place])  .  Hyperlipidemia  . Hyperthyroidism  . Obesity hypoventilation syndrome ([place]) 06/20/2013 Past Surgical History: Past Surgical History Procedure Laterality Date . Ventral hernia repair   HPI: Iolanda Folson is a 64 y.o. female who has a PMH of HTN; CVA (LEFT parietal) ([place]); Arthritis; DM, type 2 ([place]); Hyperlipidemia; Hyperthyroidism; and Obesity hypoventilation syndrome ([place]), presented to [facility] 08/02/15 with AMS. Baseline dependent on daughter for ADLs, previous CVA resulted in short term memory deficits, daughter reports pt sometimes "spits up",  episode of vomiting on Saturday. Currently being treated for possible UTI, acute kidney injury, CT indicated no acute infarcts, CXR mild central pulmonary vascular congestion and interstitial edema accentuated by borderline hypo inflation. There is no alveolar pneumonia nor pleural effusion. Not previously seen by SLP, currently NPO. No Data Recorded Assessment / Plan / Recommendation CHL IP CLINICAL IMPRESSIONS 08/05/2015 Therapy Diagnosis Severe oral phase dysphagia;Mild pharyngeal phase dysphagia Clinical Impression Pt demonstrated severe oral dysphagia and mild pharyngeal dysphagia due to AMS resulting in reduced awareness and coordination of bolus. Pt's oral phase is prolonged: pt holds bolus causing premature spillage of all consistencies and piecemeals thin and nectar thick liquids. Pt has a delayed swallow initiation with all consistencies resulting in unsensed penetration of boluses. Pt does not follow max verbal, tactile, and visual cues for second swallow to clear residue, but residue slightly improves with presentation of dry spoon to initiate second swallow, but is not consistent to clear all residue. Penetrate is not consistently cleared with throat clear/ cough as pt does not follow cues to clear throat/cough consistently. SLP suspects pt's swallow function will improve as AMS improves, thin liquids are the safest when pt is presented small boluses. SLP  recommends pt remain NPO, will f/u with further po trials to assess diet readiness.    CHL IP TREATMENT RECOMMENDATION 08/05/2015 Treatment Recommendations Therapy as outlined in treatment plan below   CHL IP DIET RECOMMENDATION 08/05/2015 SLP Diet Recommendations NPO Liquid Administration via -- Medication Administration Via alternative means Compensations -- Postural Changes --   No flowsheet data found.  CHL IP FOLLOW UP RECOMMENDATIONS 08/05/2015 Follow up Recommendations Skilled Nursing facility   [facility] IP FREQUENCY AND DURATION 08/05/2015 Speech Therapy Frequency (ACUTE ONLY) min 2x/week Treatment Duration 2 weeks      CHL IP ORAL PHASE 08/05/2015 Oral Phase Impaired Oral - Pudding Teaspoon -- Oral - Pudding Cup -- Oral - Honey Teaspoon -- Oral - Honey Cup -- Oral - Nectar Teaspoon -- Oral - Nectar Cup Left anterior bolus loss;Right anterior bolus loss;Holding of bolus;Piecemeal swallowing;Delayed oral transit Oral - Nectar Straw Left anterior bolus loss;Right anterior bolus loss;Holding of bolus;Piecemeal swallowing;Delayed oral transit Oral - Thin Teaspoon -- Oral - Thin Cup Left anterior bolus loss;Right anterior bolus loss;Holding of bolus;Piecemeal swallowing;Delayed oral transit;Lingual/palatal residue Oral - Thin Straw -- Oral - Puree Left anterior bolus loss;Right anterior bolus loss;Holding of bolus;Piecemeal swallowing;Delayed oral transit Oral - Mech Soft -- Oral - Regular -- Oral - Multi-Consistency -- Oral - Pill -- Oral Phase - Comment --  CHL IP PHARYNGEAL PHASE 08/05/2015 Pharyngeal Phase Thin;Nectar;Solids Pharyngeal- Pudding Teaspoon -- Pharyngeal -- Pharyngeal- Pudding Cup -- Pharyngeal -- Pharyngeal- Honey Teaspoon -- Pharyngeal -- Pharyngeal- Honey Cup -- Pharyngeal -- Pharyngeal- Nectar Teaspoon NT Pharyngeal -- Pharyngeal- Nectar Cup Delayed swallow initiation-vallecula;Pharyngeal residue - pyriform;Pharyngeal residue - valleculae;Penetration/Aspiration during swallow Pharyngeal Material  enters airway, remains ABOVE vocal cords and not ejected out Pharyngeal- Nectar Straw Delayed swallow initiation-vallecula;Pharyngeal residue - valleculae;Pharyngeal residue - pyriform;Penetration/Aspiration during swallow Pharyngeal Material enters airway, remains ABOVE vocal cords and not ejected out Pharyngeal- Thin Teaspoon NT Pharyngeal -- Pharyngeal- Thin Cup Delayed swallow initiation-pyriform sinuses;Pharyngeal residue - pyriform Pharyngeal -- Pharyngeal- Thin Straw Delayed swallow initiation-pyriform sinuses;Pharyngeal residue - pyriform;Pharyngeal residue - valleculae Pharyngeal -- Pharyngeal- Puree Delayed swallow initiation-vallecula;Pharyngeal residue - valleculae;Pharyngeal residue - pyriform Pharyngeal -- Pharyngeal- Mechanical Soft NT Pharyngeal -- Pharyngeal- Regular NT Pharyngeal -- Pharyngeal- Multi-consistency NT Pharyngeal -- Pharyngeal- Pill NT Pharyngeal -- Pharyngeal Comment --  CHL IP  CERVICAL ESOPHAGEAL PHASE 08/05/2015 Cervical Esophageal Phase WFL Pudding Teaspoon -- Pudding Cup -- Honey Teaspoon -- Honey Cup -- Nectar Teaspoon -- Nectar Cup -- Nectar Straw -- Thin Teaspoon -- Thin Cup -- Thin Straw -- Puree -- Mechanical Soft -- Regular -- Multi-consistency -- Pill -- Cervical Esophageal Comment -- Lanier Ensign, SLP Student Supervised and reviewed by Herbie Baltimore MA CCC-SLP [place], Katherene Ponto 08/05/2015, 2:41 PM              Medications: I have reviewed the patient's current medications. Scheduled Meds: . antiseptic oral rinse  7 mL Mouth Rinse q12n4p  . atorvastatin  40 mg Oral q1800  . aztreonam  500 mg Intravenous 3 times per day  . bisacodyl  10 mg Rectal Daily  . chlorhexidine  15 mL Mouth Rinse BID  . clopidogrel  75 mg Oral Daily  . famotidine  10 mg Oral BID  . heparin  5,000 Units Subcutaneous 3 times per day  . metoprolol  5 mg Intravenous 4 times per day  . multivitamin with minerals  1 tablet Oral Daily  . niacin  1,000 mg Oral QHS  . propylthiouracil   50 mg Oral BID   Continuous Infusions:   PRN Meds:.acetaminophen **OR** acetaminophen, dextrose, nitroGLYCERIN, promethazine, senna-docusate Assessment/Plan: Principal Problem:   Acute encephalopathy Active Problems:   HYPERTENSION, BENIGN   CAD, NATIVE VESSEL   Obesity hypoventilation syndrome (HCC)   AKI (acute kidney injury) ([place])   Hyperthyroidism   Type 2 diabetes mellitus (HCC)   Lactic acidosis   History of CVA (cerebrovascular accident)   Chronic systolic HF (heart failure) (HCC)   Cognitive impairment   Altered mental status  Acute encephalopathy and Sepsis 2/2 UTI Patient is currently AAOx2, following verbal commands, is much more alert and talking more. Her speech is comprehensible. She denies having any complaints. Denies having any CP, SOB, or abdominal pain. Denies having pain anywhere else. Patient does have baseline decreased mental status. CT of head showing atrophy with widespread abnormal attenuation in the supratentorial white matter as well as in the lateral left cerebellum and mid pontine region, more severe on the right than on the left. Prior infarct in the anterior left parietal lobe. A degree of superimposed demyelination cannot be excluded The overall appearance is essentially stable compared to the prior studies without demonstrable acute infarct. No acute hemorrhage or mass. EEG was normal. However, sepsis and worsening mentation likely in the setting of UTI because urine culture growing >100,000 cfu of Klebsiella pneumoniae. Blood culture x 2 showing no growth in 3 days. Renal US showing a 2.9 cm simple cyst on the lower pole of the L kidney. No hydronephrosis seen. T max 100.1 in the past 24 hrs. No leukocytosis. BP stable.  -Continue Aztreonam -F/u final blood cx results   Hypoglycemia: Resolved. Blood glucose stable now (106-146).  -Continue IV dextrose 5%-1/2 NS because patient is NPO due to aspiration risk   Lactic acidosis: Resolved.  AKI on CKD:  Likely from ATN in the setting of sepsis/ hypotension and continued use of mobic, enalapril, and lasix. Scr improved to 4.70 today from peak 6.06. Patient's BL SCr is unknown but SCr was 1.5 during admission in May 2016.  -Holding NSAIDs, ACEi/ARB, and lasix. -Continue IVF hydration    Hyperthyroidism: TSH low, free T4 high. -continue PTU  HTN: BP 164/81 at present.  -continue Lopressor 6 mg IV q6  DM: A1c 6.3. CBG 106-146. Patient is currently NPO due to aspiration risk.  -  Holding metformin -SSI-S  Hx of CVA -Continue Plavix 75 mg daily   HLD: stable -continue Lipitor 40 mg daily  -continue Niacin '1000mg'$  daily   GERD: stable -Pepcid 10 mg BID  Deconditioning: PT suggesting inpatient rehab.  FEN Fluids - dextrose 5%-1/2 NS Electrolytes - replete as needed Nutrition - NPO  VTE ppx: Heparin subcutaneous   Dispo: Disposition is deferred at this time, awaiting improvement of current medical problems.  Anticipated discharge in approximately 1-2 day(s).   The patient does have a current PCP Delia Chimes, NP) and does need an [facility] hospital follow-up appointment after discharge.  The patient does not have transportation limitations that hinder transportation to clinic appointments.  .Services Needed at time of discharge: Y = Yes, Blank = No PT:   OT:   RN:   Equipment:   Other:     LOS: 3 days   Shela Leff, MD 08/05/2015, 4:11 PM

## 2015-08-05 NOTE — NC FL2 (Signed)
[place] LEVEL OF CARE SCREENING TOOL     IDENTIFICATION  Patient Name: Angelica Beck Birthdate: [birth year] Sex: female Admission Date (Current Location): 08/02/2015  [facility] and [place] Number: Herbalist and Address:  [facility], [place] [address], [place], [place] [address]      Provider Number: [id number]  Attending Physician Name and Address:  Aldine Contes, MD  Relative Name and Phone Number:       Current Level of Care: Hospital Recommended Level of Care: [place] Prior Approval Number:    Date Approved/Denied:   PASRR Number: [id number]  Discharge Plan: SNF    Current Diagnoses: Patient Active Problem List   Diagnosis Date Noted  . Altered mental status   . Lactic acidosis 08/02/2015  . Acute encephalopathy 08/02/2015  . History of CVA (cerebrovascular accident) 08/02/2015  . Chronic systolic HF (heart failure) ([place]) 08/02/2015  . Cognitive impairment 08/02/2015  . AKI (acute kidney injury) ([place]) 02/08/2015  . Abnormality of gait 02/08/2015  . Hyperthyroidism 02/08/2015  . Type 2 diabetes mellitus ([place]) 02/08/2015  . Chest pain 01/19/2014  . Obesity hypoventilation syndrome ([place]) 06/20/2013  . HYPERTHYROIDISM 06/28/2009  . DIAB W/UNS COMP TYPE II/UNS NOT STATED UNCNTRL 06/28/2009  . HYPERLIPIDEMIA-MIXED 06/28/2009  . OVERWEIGHT/OBESITY 06/28/2009  . HYPERTENSION, BENIGN 06/28/2009  . CAD, NATIVE VESSEL 06/28/2009  . DYSPNEA 06/28/2009    Orientation ACTIVITIES/SOCIAL BLADDER RESPIRATION    Self    Incontinent Normal  BEHAVIORAL SYMPTOMS/MOOD NEUROLOGICAL BOWEL NUTRITION STATUS      Incontinent Diet (see DC summary)  PHYSICIAN VISITS COMMUNICATION OF NEEDS Height & Weight Skin    Verbally   225 lbs. Normal          AMBULATORY STATUS RESPIRATION    Assist extensive Normal      Personal Care Assistance Level of Assistance  Dressing, Feeding, Bathing Bathing Assistance:  Maximum assistance Feeding assistance: Maximum assistance Dressing Assistance: Maximum assistance      Functional Limitations Info  Speech     Speech Info: Adequate (slurred)       SPECIAL CARE FACTORS FREQUENCY  PT (By licensed PT)     PT Frequency: 5/wk             Additional Factors Info  Code Status, Allergies Code Status Info: FULL Allergies Info: penicillins           Current Medications (08/05/2015): Current Facility-Administered Medications  Medication Dose Route Frequency Provider Last Rate Last Dose  . acetaminophen (TYLENOL) tablet 650 mg  650 mg Oral Q6H PRN Jones Bales, MD       Or  . acetaminophen (TYLENOL) suppository 650 mg  650 mg Rectal Q6H PRN Jones Bales, MD      . antiseptic oral rinse (CPC / CETYLPYRIDINIUM CHLORIDE 0.05%) solution 7 mL  7 mL Mouth Rinse q12n4p Nischal Narendra, MD   7 mL at 08/04/15 1505  . atorvastatin (LIPITOR) tablet 40 mg  40 mg Oral q1800 Kris Mouton, RPH   40 mg at 08/03/15 1800  . aztreonam (AZACTAM) 500 mg in dextrose 5 % 50 mL IVPB  500 mg Intravenous 3 times per day Horatio Pel, RPH 100 mL/hr at 08/05/15 0134 500 mg at 08/05/15 0134  . bisacodyl (DULCOLAX) suppository 10 mg  10 mg Rectal Daily Jones Bales, MD   10 mg at 08/05/15 1123  . chlorhexidine (PERIDEX) 0.12 % solution 15 mL  15 mL Mouth Rinse BID  Aldine Contes, MD   15 mL at 08/05/15 1124  . clopidogrel (PLAVIX) tablet 75 mg  75 mg Oral Daily Jones Bales, MD   75 mg at 08/02/15 1430  . dextrose 50 % solution 50 mL  1 ampule Intravenous Once PRN Jones Bales, MD      . famotidine (PEPCID) tablet 10 mg  10 mg Oral BID Jones Bales, MD   10 mg at 08/02/15 1430  . heparin injection 5,000 Units  5,000 Units Subcutaneous 3 times per day Jones Bales, MD   5,000 Units at 08/05/15 0553  . metoprolol (LOPRESSOR) injection 5 mg  5 mg Intravenous 4 times per day Dellia Nims, MD   5 mg at 08/05/15 0553  . multivitamin with  minerals tablet 1 tablet  1 tablet Oral Daily Jones Bales, MD   1 tablet at 08/02/15 1430  . niacin (NIASPAN) CR tablet 1,000 mg  1,000 mg Oral QHS Jones Bales, MD   1,000 mg at 08/02/15 2228  . nitroGLYCERIN (NITROSTAT) SL tablet 0.4 mg  0.4 mg Sublingual Q5 min PRN Jones Bales, MD      . promethazine (PHENERGAN) injection 6.25 mg  6.25 mg Intravenous Q8H PRN Shela Leff, MD   6.25 mg at 08/03/15 1800  . propylthiouracil (PTU) tablet 50 mg  50 mg Oral BID Jones Bales, MD   50 mg at 08/02/15 1430  . senna-docusate (Senokot-S) tablet 1 tablet  1 tablet Oral QHS PRN Jones Bales, MD       Do not use this list as official medication orders. Please verify with discharge summary.  Discharge Medications:   Medication List    ASK your doctor about these medications        albuterol 108 (90 BASE) MCG/ACT inhaler  Commonly known as:  PROVENTIL HFA;VENTOLIN HFA  Inhale 2 puffs into the lungs every 6 (six) hours as needed for wheezing or shortness of breath.     aspirin EC 81 MG tablet  Take 1 tablet (81 mg total) by mouth daily.     baclofen 10 MG tablet  Commonly known as:  LIORESAL  Take 10 mg by mouth 3 (three) times daily as needed. Muscle spasms     clopidogrel 75 MG tablet  Commonly known as:  PLAVIX  Take 75 mg by mouth daily.     diphenhydramine-acetaminophen 25-500 MG Tabs tablet  Commonly known as:  TYLENOL PM  Take 1 tablet by mouth at bedtime as needed (sleep).     enalapril 20 MG tablet  Commonly known as:  VASOTEC  Take 20 mg by mouth daily.     furosemide 40 MG tablet  Commonly known as:  LASIX  Take 40 mg by mouth every morning.     gabapentin 100 MG capsule  Commonly known as:  NEURONTIN  Take 100 mg by mouth at bedtime.     insulin aspart 100 UNIT/ML FlexPen  Commonly known as:  NOVOLOG  Inject 15 Units into the skin as needed for high blood sugar (per sliding scale).     insulin glargine 100 unit/mL Sopn  Commonly known as:   LANTUS  Inject 23 Units into the skin at bedtime.     insulin lispro 100 UNIT/ML KiwkPen  Commonly known as:  HUMALOG  Inject 15 Units into the skin 3 (three) times daily as needed (CBG 200 or more).     meloxicam 15 MG tablet  Commonly known as:  MOBIC  Take 15 mg by mouth daily.     metFORMIN 1000 MG tablet  Commonly known as:  GLUCOPHAGE  Take 1,000 mg by mouth 2 (two) times daily.     metoprolol succinate 50 MG 24 hr tablet  Commonly known as:  TOPROL-XL  Take 50 mg by mouth daily.     niacin 1000 MG CR tablet  Commonly known as:  NIASPAN  Take 1,000 mg by mouth at bedtime.     nitroGLYCERIN 0.4 MG SL tablet  Commonly known as:  NITROSTAT  Place 1 tablet (0.4 mg total) under the tongue every 5 (five) minutes as needed for chest pain.     oxybutynin 10 MG 24 hr tablet  Commonly known as:  DITROPAN-XL  Take 10 mg by mouth daily.     propylthiouracil 50 MG tablet  Commonly known as:  PTU  Take 50 mg by mouth 2 (two) times daily.     ranitidine 150 MG tablet  Commonly known as:  ZANTAC  Take 150 mg by mouth 2 (two) times daily.     rosuvastatin 40 MG tablet  Commonly known as:  CRESTOR  Take 40 mg by mouth at bedtime.     traMADol 50 MG tablet  Commonly known as:  ULTRAM  Take 50 mg by mouth every 6 (six) hours as needed (pain).     traZODone 50 MG tablet  Commonly known as:  DESYREL  Take 50 mg by mouth at bedtime as needed for sleep.     Vitamin D 2000 UNITS tablet  Take 2,000 Units by mouth daily.        Relevant Imaging Results:  Relevant Lab Results:  Recent Labs    Additional Information    Cranford Mon, LCSW

## 2015-08-06 ENCOUNTER — Inpatient Hospital Stay (HOSPITAL_COMMUNITY)
Admission: AD | Admit: 2015-08-06 | Discharge: 2015-08-24 | DRG: 070 | Disposition: A | Discharge status: Home Health | Payer: Medicare Other | Source: Intra-hospital | Attending: Physical Medicine & Rehabilitation | Admitting: Physical Medicine & Rehabilitation

## 2015-08-06 DIAGNOSIS — E875 Hyperkalemia: Secondary | ICD-10-CM | POA: Diagnosis not present

## 2015-08-06 DIAGNOSIS — E876 Hypokalemia: Secondary | ICD-10-CM | POA: Diagnosis present

## 2015-08-06 DIAGNOSIS — B961 Klebsiella pneumoniae [K. pneumoniae] as the cause of diseases classified elsewhere: Secondary | ICD-10-CM | POA: Diagnosis present

## 2015-08-06 DIAGNOSIS — N170 Acute kidney failure with tubular necrosis: Secondary | ICD-10-CM | POA: Diagnosis present

## 2015-08-06 DIAGNOSIS — A499 Bacterial infection, unspecified: Secondary | ICD-10-CM | POA: Diagnosis not present

## 2015-08-06 DIAGNOSIS — N189 Chronic kidney disease, unspecified: Secondary | ICD-10-CM | POA: Diagnosis present

## 2015-08-06 DIAGNOSIS — Z8673 Personal history of transient ischemic attack (TIA), and cerebral infarction without residual deficits: Secondary | ICD-10-CM

## 2015-08-06 DIAGNOSIS — I129 Hypertensive chronic kidney disease with stage 1 through stage 4 chronic kidney disease, or unspecified chronic kidney disease: Secondary | ICD-10-CM | POA: Diagnosis present

## 2015-08-06 DIAGNOSIS — E1165 Type 2 diabetes mellitus with hyperglycemia: Secondary | ICD-10-CM | POA: Diagnosis not present

## 2015-08-06 DIAGNOSIS — R1310 Dysphagia, unspecified: Secondary | ICD-10-CM | POA: Diagnosis present

## 2015-08-06 DIAGNOSIS — G9340 Encephalopathy, unspecified: Principal | ICD-10-CM | POA: Diagnosis present

## 2015-08-06 DIAGNOSIS — I5022 Chronic systolic (congestive) heart failure: Secondary | ICD-10-CM | POA: Diagnosis not present

## 2015-08-06 DIAGNOSIS — E1122 Type 2 diabetes mellitus with diabetic chronic kidney disease: Secondary | ICD-10-CM | POA: Diagnosis present

## 2015-08-06 DIAGNOSIS — E1142 Type 2 diabetes mellitus with diabetic polyneuropathy: Secondary | ICD-10-CM | POA: Diagnosis present

## 2015-08-06 DIAGNOSIS — B965 Pseudomonas (aeruginosa) (mallei) (pseudomallei) as the cause of diseases classified elsewhere: Secondary | ICD-10-CM | POA: Diagnosis present

## 2015-08-06 DIAGNOSIS — G4700 Insomnia, unspecified: Secondary | ICD-10-CM | POA: Diagnosis present

## 2015-08-06 DIAGNOSIS — D649 Anemia, unspecified: Secondary | ICD-10-CM | POA: Diagnosis present

## 2015-08-06 DIAGNOSIS — N390 Urinary tract infection, site not specified: Secondary | ICD-10-CM | POA: Diagnosis present

## 2015-08-06 DIAGNOSIS — R269 Unspecified abnormalities of gait and mobility: Secondary | ICD-10-CM | POA: Diagnosis not present

## 2015-08-06 DIAGNOSIS — I10 Essential (primary) hypertension: Secondary | ICD-10-CM | POA: Diagnosis not present

## 2015-08-06 DIAGNOSIS — E785 Hyperlipidemia, unspecified: Secondary | ICD-10-CM | POA: Diagnosis present

## 2015-08-06 DIAGNOSIS — A419 Sepsis, unspecified organism: Secondary | ICD-10-CM | POA: Diagnosis present

## 2015-08-06 LAB — BASIC METABOLIC PANEL
ANION GAP: 10 (ref 5–15)
BUN: 24 mg/dL — ABNORMAL HIGH (ref 6–20)
CALCIUM: 8.8 mg/dL — AB (ref 8.9–10.3)
CO2: 23 mmol/L (ref 22–32)
Chloride: 112 mmol/L — ABNORMAL HIGH (ref 101–111)
Creatinine, Ser: 4.01 mg/dL — ABNORMAL HIGH (ref 0.44–1.00)
GFR, EST AFRICAN AMERICAN: 13 mL/min — AB (ref >60)
GFR, EST NON AFRICAN AMERICAN: 11 mL/min — AB (ref >60)
Glucose, Bld: 130 mg/dL — ABNORMAL HIGH (ref 65–99)
Potassium: 3.7 mmol/L (ref 3.5–5.1)
SODIUM: 145 mmol/L (ref 135–145)

## 2015-08-06 LAB — CBC
HCT: 33.2 % — ABNORMAL LOW (ref 36.0–46.0)
HEMATOCRIT: 30.3 % — AB (ref 36.0–46.0)
HEMOGLOBIN: 10.6 g/dL — AB (ref 12.0–15.0)
Hemoglobin: 9.7 g/dL — ABNORMAL LOW (ref 12.0–15.0)
MCH: 24.9 pg — ABNORMAL LOW (ref 26.0–34.0)
MCH: 24.3 pg — AB (ref 26.0–34.0)
MCHC: 32 g/dL (ref 30.0–36.0)
MCHC: 31.9 g/dL (ref 30.0–36.0)
MCV: 77.9 fL — ABNORMAL LOW (ref 78.0–100.0)
MCV: 76 fL — AB (ref 78.0–100.0)
PLATELETS: 198 10*3/uL (ref 150–400)
Platelets: 182 10*3/uL (ref 150–400)
RBC: 3.89 MIL/uL (ref 3.87–5.11)
RBC: 4.37 MIL/uL (ref 3.87–5.11)
RDW: 19.1 % — AB (ref 11.5–15.5)
RDW: 19.1 % — ABNORMAL HIGH (ref 11.5–15.5)
WBC: 8.2 10*3/uL (ref 4.0–10.5)
WBC: 11.5 10*3/uL — ABNORMAL HIGH (ref 4.0–10.5)

## 2015-08-06 LAB — CREATININE, SERUM
Creatinine, Ser: 3.56 mg/dL — ABNORMAL HIGH (ref 0.44–1.00)
GFR calc Af Amer: 15 mL/min — ABNORMAL LOW (ref >60)
GFR calc non Af Amer: 13 mL/min — ABNORMAL LOW (ref >60)

## 2015-08-06 LAB — GLUCOSE, CAPILLARY
GLUCOSE-CAPILLARY: 124 mg/dL — AB (ref 65–99)
GLUCOSE-CAPILLARY: 126 mg/dL — AB (ref 65–99)
GLUCOSE-CAPILLARY: 132 mg/dL — AB (ref 65–99)
GLUCOSE-CAPILLARY: 221 mg/dL — AB (ref 65–99)
Glucose-Capillary: 237 mg/dL — ABNORMAL HIGH (ref 65–99)

## 2015-08-06 MED ORDER — ADULT MULTIVITAMIN W/MINERALS CH
1.0000 | ORAL_TABLET | Freq: Every day | ORAL | Status: DC
  Administered 2015-08-07 – 2015-08-24 (×18): 1 via ORAL
  Filled 2015-08-06 (×18): qty 1

## 2015-08-06 MED ORDER — ACETAMINOPHEN 325 MG PO TABS
650.0000 mg | ORAL_TABLET | Freq: Four times a day (QID) | ORAL | Status: DC | PRN
  Administered 2015-08-18: 650 mg via ORAL
  Filled 2015-08-06 (×2): qty 2

## 2015-08-06 MED ORDER — CLONIDINE HCL 0.1 MG PO TABS
0.1000 mg | ORAL_TABLET | ORAL | Status: DC | PRN
  Administered 2015-08-06: 0.1 mg via ORAL
  Filled 2015-08-06: qty 1

## 2015-08-06 MED ORDER — DEXTROSE-NACL 5-0.45 % IV SOLN
INTRAVENOUS | Status: DC
  Administered 2015-08-06: 10:00:00 via INTRAVENOUS

## 2015-08-06 MED ORDER — SORBITOL 70 % SOLN: 30.0000 mL | Freq: Every day | Status: DC | PRN

## 2015-08-06 MED ORDER — INSULIN GLARGINE 100 UNIT/ML ~~LOC~~ SOLN
23.0000 [IU] | Freq: Every day | SUBCUTANEOUS | Status: DC
  Administered 2015-08-06 – 2015-08-12 (×7): 23 [IU] via SUBCUTANEOUS
  Filled 2015-08-06 (×8): qty 0.23

## 2015-08-06 MED ORDER — NIACIN ER (ANTIHYPERLIPIDEMIC) 500 MG PO TBCR
1000.0000 mg | EXTENDED_RELEASE_TABLET | Freq: Every day | ORAL | Status: DC
  Administered 2015-08-06 – 2015-08-23 (×18): 1000 mg via ORAL
  Filled 2015-08-06 (×20): qty 2

## 2015-08-06 MED ORDER — SENNOSIDES-DOCUSATE SODIUM 8.6-50 MG PO TABS: 1.0000 | ORAL_TABLET | Freq: Every evening | ORAL | Status: DC | PRN

## 2015-08-06 MED ORDER — CLOPIDOGREL BISULFATE 75 MG PO TABS
75.0000 mg | ORAL_TABLET | Freq: Every day | ORAL | Status: DC
  Administered 2015-08-07 – 2015-08-24 (×18): 75 mg via ORAL
  Filled 2015-08-06 (×18): qty 1

## 2015-08-06 MED ORDER — NITROGLYCERIN 0.4 MG SL SUBL: 0.4000 mg | SUBLINGUAL_TABLET | SUBLINGUAL | Status: DC | PRN

## 2015-08-06 MED ORDER — CIPROFLOXACIN HCL 500 MG PO TABS: 500.0000 mg | ORAL_TABLET | Freq: Two times a day (BID) | ORAL | Status: DC

## 2015-08-06 MED ORDER — HEPARIN SODIUM (PORCINE) 5000 UNIT/ML IJ SOLN: 5000.0000 [IU] | Freq: Three times a day (TID) | INTRAMUSCULAR | Status: DC

## 2015-08-06 MED ORDER — METOPROLOL SUCCINATE ER 50 MG PO TB24
50.0000 mg | ORAL_TABLET | Freq: Every day | ORAL | Status: DC
  Administered 2015-08-07 – 2015-08-11 (×5): 50 mg via ORAL
  Filled 2015-08-06 (×5): qty 1

## 2015-08-06 MED ORDER — BOOST / RESOURCE BREEZE PO LIQD: 1.0000 | Freq: Two times a day (BID) | ORAL | Status: DC

## 2015-08-06 MED ORDER — GABAPENTIN 100 MG PO CAPS
100.0000 mg | ORAL_CAPSULE | Freq: Every day | ORAL | Status: DC
  Administered 2015-08-06 – 2015-08-23 (×18): 100 mg via ORAL
  Filled 2015-08-06 (×18): qty 1

## 2015-08-06 MED ORDER — ONDANSETRON HCL 4 MG PO TABS
4.0000 mg | ORAL_TABLET | Freq: Four times a day (QID) | ORAL | Status: DC | PRN
  Administered 2015-08-11 – 2015-08-19 (×2): 4 mg via ORAL
  Filled 2015-08-06 (×3): qty 1

## 2015-08-06 MED ORDER — BISACODYL 10 MG RE SUPP
10.0000 mg | Freq: Every day | RECTAL | Status: DC
  Administered 2015-08-08 – 2015-08-24 (×7): 10 mg via RECTAL
  Filled 2015-08-06 (×12): qty 1

## 2015-08-06 MED ORDER — HEPARIN SODIUM (PORCINE) 5000 UNIT/ML IJ SOLN
5000.0000 [IU] | Freq: Three times a day (TID) | INTRAMUSCULAR | Status: DC
  Administered 2015-08-06 – 2015-08-24 (×53): 5000 [IU] via SUBCUTANEOUS
  Filled 2015-08-06 (×53): qty 1

## 2015-08-06 MED ORDER — FAMOTIDINE 10 MG PO TABS
10.0000 mg | ORAL_TABLET | Freq: Two times a day (BID) | ORAL | Status: DC
  Administered 2015-08-06 – 2015-08-24 (×36): 10 mg via ORAL
  Filled 2015-08-06 (×37): qty 1

## 2015-08-06 MED ORDER — ONDANSETRON HCL 4 MG/2ML IJ SOLN: 4.0000 mg | Freq: Four times a day (QID) | INTRAMUSCULAR | Status: DC | PRN

## 2015-08-06 MED ORDER — PROPYLTHIOURACIL 50 MG PO TABS
50.0000 mg | ORAL_TABLET | Freq: Two times a day (BID) | ORAL | Status: DC
  Administered 2015-08-06 – 2015-08-24 (×36): 50 mg via ORAL
  Filled 2015-08-06 (×44): qty 1

## 2015-08-06 MED ORDER — CIPROFLOXACIN HCL 500 MG PO TABS
500.0000 mg | ORAL_TABLET | Freq: Every day | ORAL | Status: AC
Start: 2015-08-06 — End: 2015-08-13
  Administered 2015-08-06 – 2015-08-13 (×8): 500 mg via ORAL
  Filled 2015-08-06 (×8): qty 1

## 2015-08-06 MED ORDER — ATORVASTATIN CALCIUM 40 MG PO TABS
40.0000 mg | ORAL_TABLET | Freq: Every day | ORAL | Status: DC
  Administered 2015-08-06 – 2015-08-24 (×18): 40 mg via ORAL
  Filled 2015-08-06 (×20): qty 1

## 2015-08-06 MED ORDER — ACETAMINOPHEN 650 MG RE SUPP: 650.0000 mg | Freq: Four times a day (QID) | RECTAL | Status: DC | PRN

## 2015-08-06 NOTE — Care Management Note (Signed)
Case Management Note  Patient Details  Name: Angelica Beck MRN: [id number] Date of Birth: 07-04-1951  Subjective/Objective:                 Per [facility], patient can be discharged to them today.    Action/Plan:  DC order in, anticipate DC to CIR today.  Expected Discharge Date:                  Expected Discharge Plan:  Skilled Nursing Facility  In-House Referral:  Clinical Social Work  Discharge planning Services  CM Consult  Post Acute Care Choice:    Choice offered to:     DME Arranged:    DME Agency:     HH Arranged:    [place] Agency:     Status of Service:  Completed, signed off  Medicare Important Message Given:  Yes Date Medicare IM Given:    Medicare IM give by:    Date Additional Medicare IM Given:    Additional Medicare Important Message give by:     If discussed at [place] of Stay Meetings, dates discussed:    Additional Comments:  Carles Collet, RN 08/06/2015, 3:36 PM

## 2015-08-06 NOTE — Progress Notes (Signed)
Initial Nutrition Assessment  DOCUMENTATION CODES:   Obesity unspecified  INTERVENTION:   -Boost Breeze po BID, each supplement provides 250 kcal and 9 grams of protein  NUTRITION DIAGNOSIS:   Inadequate oral intake related to poor appetite as evidenced by per patient/family report.  GOAL:   Patient will meet greater than or equal to 90% of their needs  MONITOR:   PO intake, Supplement acceptance, Labs, Weight trends, Skin, I & O's  REASON FOR ASSESSMENT:   Low [place]    ASSESSMENT:   64 y/o female with PMH of CVA, HTN, DM, HLD, hyperthyroidism p/w AMS * 1 day. Patient unable to provide history. Daughter at bedside provided history. Patient was in her usual state of health the day PTA but yesterday she woke up confused and was not oriented to place/person/time. No fevers at home but patient did have an episode of vomiting 2 days PTA. No CP, no sob, no diarrhea, no syncope, no lightheadedness. In ED noted to have LA of 10 and was hypoglycemic to 33. Also found to have AKI with creatinine of 6.   Pt admitted with AMS.  Pt was very lethargic prior to today. SLP evaluated and pt was advanced to a dysphagia 2 diet. Meal completion is variable PO: 25-75%. Pt consumed a few bites of mashed potatoes prior to RD visit.   Pt reports poor appetite PTA and currently. PTA she reveals that she didn't feel like eating ("the only thing that tasted good to me was french fries"). She reports that her swallow function is improving. She reports that she tried Boost and Ensure supplements during a prior admission but did not like the taste. She also complains that eating "hurts my stomach" after not being able to eat for the past few days.   Discussed importance of good meal intake to promote healing. Pt amenable to try Boost Breeze supplement.   Reviewed wt hx, which revealed mild wt loss, however, not progressive for time frame.   Nutrition-Focused physical exam completed. Findings are no fat  depletion, mild muscle depletion, and no edema.   CSW following. Potential CIR admission once medically stable.  Labs reviewed.   Diet Order:  DIET DYS 2 Room service appropriate?: Yes; Fluid consistency:: Thin  Skin:  Reviewed, no issues  Last BM:  08/06/15  Height:   Ht Readings from Last 1 Encounters:  08/06/15 '5\' 7"'$  (1.702 m)    Weight:   Wt Readings from Last 1 Encounters:  08/06/15 225 lb 8.5 oz (102.3 kg)    Ideal Body Weight:  61.4 kg  BMI:  Body mass index is 35.31 kg/(m^2).  Estimated Nutritional Needs:   Kcal:  1600-1800  Protein:  80-95 grams  Fluid:  1.6-1.8 L  EDUCATION NEEDS:   Education needs addressed  Angelica Beck A. Jimmye Norman, RD, LDN, CDE Pager: [phone] After hours Pager: [phone]

## 2015-08-06 NOTE — Progress Notes (Addendum)
Patient agreed to IP Rehab.  Paged MD to let them know   MD aware Malcolm Metro, RN 3:02 PM

## 2015-08-06 NOTE — H&P (Signed)
Physical Medicine and Rehabilitation Admission H&P    Chief Complaint  Patient presents with  . Altered Mental Status  : HPI: Angelica Beck is a 64 y.o. right handed female with history of hypertension, CVA with short-term memory deficits, diabetes mellitus peripheral neuropathy, hyperlipidemia, obesity hypoventilation syndrome, chronic renal insufficiency with creatinine 1.56 in May 2016. Patient lives with her daughter and has a home health aide 10 AM to 5 PM daily. Used a rolling walker mainly for pivot transfers prior to admission otherwise used a scooter. Aide assist with bathing and dressing and tub transfers. Over the past few weeks patient and needed assistance with feeding. Presents of 08/02/2015 with nonproductive cough, hypotension, altered mental status with anxiety and restlessness. Noted admission creatinine 6.06 from baseline 1.56 in May 2016. Cranial CT scan showed atrophy widespread abnormal attenuation in the supratentorial white matter as well as lateral left cerebellum and mid pontine region more severe on the right than left. Prior infarct in the anterior left parietal lobe. No acute changes. Chest x-ray negative pneumonia. Abdominal films no acute findings. Renal ultrasound unremarkable. EEG showed no seizure activity. Renal insufficiency felt to be likely ATN secondary to hypotension and possible sepsis with latest creatinine 4.01. Urine culture Klebsiella as well as Pseudomonas and currently maintained on Azactam. Blood cultures negative. Maintain on Plavix for CVA prophylaxis as well as subcutaneous heparin for DVT prophylaxis. Current workup is ongoing. Physical therapy evaluation completed 08/03/2015 with the recommendations of physical medicine rehabilitation consult. Patient was admitted for comprehensive rehabilitation program  Review of Systems  Constitutional: Positive for fever. Negative for chills.  HENT: Negative for congestion.   Eyes: Negative for blurred  vision and double vision.  Respiratory: Negative for cough and sputum production.   Cardiovascular: Negative for chest pain and palpitations.  Gastrointestinal: Negative for heartburn, nausea, vomiting, abdominal pain and diarrhea.  Genitourinary: Negative for dysuria.  Musculoskeletal: Positive for myalgias.  Skin: Negative for rash.  Neurological: Positive for focal weakness. Negative for dizziness, tingling and headaches.  Endo/Heme/Allergies: Does not bruise/bleed easily.  Psychiatric/Behavioral: Negative for depression. The patient is nervous/anxious.    Past Medical History  Diagnosis Date  . Hypertension   . Stroke ([place])   . Arthritis   . Diabetes mellitus, type 2 ([place])   . Hyperlipidemia   . Hyperthyroidism   . Obesity hypoventilation syndrome ([place]) 06/20/2013   Past Surgical History  Procedure Laterality Date  . Ventral hernia repair     Family History  Problem Relation Age of Onset  . Diabetes Mother    Social History:  reports that she quit smoking about 12 years ago. Her smoking use included Cigarettes. She has a 7.5 pack-year smoking history. She has never used smokeless tobacco. She reports that she does not drink alcohol or use illicit drugs. Allergies:  Allergies  Allergen Reactions  . Penicillins Swelling    Facial swelling   Medications Prior to Admission  Medication Sig Dispense Refill  . aspirin EC 81 MG tablet Take 1 tablet (81 mg total) by mouth daily. 30 tablet 0  . baclofen (LIORESAL) 10 MG tablet Take 10 mg by mouth 3 (three) times daily as needed. Muscle spasms    . Cholecalciferol (VITAMIN D) 2000 UNITS tablet Take 2,000 Units by mouth daily.    . clopidogrel (PLAVIX) 75 MG tablet Take 75 mg by mouth daily.     . diphenhydramine-acetaminophen (TYLENOL PM) 25-500 MG TABS Take 1 tablet by mouth at bedtime as needed (sleep).    Marland Kitchen  enalapril (VASOTEC) 20 MG tablet Take 20 mg by mouth daily.     . furosemide (LASIX) 40 MG tablet Take 40 mg by mouth  every morning.    . gabapentin (NEURONTIN) 100 MG capsule Take 100 mg by mouth at bedtime.     . insulin aspart (NOVOLOG) 100 UNIT/ML FlexPen Inject 15 Units into the skin as needed for high blood sugar (per sliding scale).     . insulin glargine (LANTUS) 100 unit/mL SOPN Inject 23 Units into the skin at bedtime.    . insulin lispro (HUMALOG) 100 UNIT/ML KiwkPen Inject 15 Units into the skin 3 (three) times daily as needed (CBG 200 or more).    . meloxicam (MOBIC) 15 MG tablet Take 15 mg by mouth daily.    . metFORMIN (GLUCOPHAGE) 1000 MG tablet Take 1,000 mg by mouth 2 (two) times daily.    . metoprolol succinate (TOPROL-XL) 50 MG 24 hr tablet Take 50 mg by mouth daily.     . niacin (NIASPAN) 1000 MG CR tablet Take 1,000 mg by mouth at bedtime.     . nitroGLYCERIN (NITROSTAT) 0.4 MG SL tablet Place 1 tablet (0.4 mg total) under the tongue every 5 (five) minutes as needed for chest pain. 30 tablet 0  . oxybutynin (DITROPAN-XL) 10 MG 24 hr tablet Take 10 mg by mouth daily.     Marland Kitchen propylthiouracil (PTU) 50 MG tablet Take 50 mg by mouth 2 (two) times daily.     . ranitidine (ZANTAC) 150 MG tablet Take 150 mg by mouth 2 (two) times daily.     . rosuvastatin (CRESTOR) 40 MG tablet Take 40 mg by mouth at bedtime.    . traMADol (ULTRAM) 50 MG tablet Take 50 mg by mouth every 6 (six) hours as needed (pain).     . traZODone (DESYREL) 50 MG tablet Take 50 mg by mouth at bedtime as needed for sleep.     Marland Kitchen albuterol (PROVENTIL HFA;VENTOLIN HFA) 108 (90 BASE) MCG/ACT inhaler Inhale 2 puffs into the lungs every 6 (six) hours as needed for wheezing or shortness of breath.      Home: Home Living Family/patient expects to be discharged to:: Inpatient rehab Living Arrangements: Children Available Help at Discharge: Family, Personal care attendant, Available PRN/intermittently Type of Home: House Home Access: Level entry Home Layout: Two level, Able to live on main level with bedroom/bathroom Alternate Level  Stairs-Number of Steps: 1 flight Alternate Level Stairs-Rails: Left Home Equipment: Walker - 2 wheels, Tub bench, Electric scooter, Bedside commode Additional Comments: Angelica Beck, pt's daughter, provided pt's information.  Angelica Beck providees pericare to pt in morning before going to work around [place] care attendant comes at [employer] and stays til 5pm.  In the hours in between pt remains in bed where she either sleeps or watches TV.     Functional History: Prior Function Level of Independence: Needs assistance Gait / Transfers Assistance Needed: RW for mainly pivot transfers only.  Otherwise uses scooter. ADL's / Homemaking Assistance Needed: Aide assists with bathing and dressing and tub transfer.  Over the past few weeks pt has needed assist w/ feeding.  Functional Status:  Mobility: Bed Mobility Overal bed mobility: Needs Assistance Bed Mobility: Rolling, Supine to Sit, Sit to Supine Rolling: Min assist Supine to sit: Mod assist Sit to supine: Mod assist General bed mobility comments: Rolling to right/left to assist with pericare. Mod A to elevate trunk and to bring BLEs into bed to return to supine. increased time/effort.  heavy use of rails. Transfers Overall transfer level: Needs assistance Equipment used: Rolling walker (2 wheeled) Transfers: Sit to/from Stand, [employer] Transfers Sit to Stand: Mod assist Stand pivot transfers: Min assist General transfer comment: Mod A to boost from EOB with cues for hand placement/technique. Bil knee buckling x2 resulting on falling back onto bed. SPT bed to chair with Min A for balance/safety.       ADL:    Cognition: Cognition Overall Cognitive Status: No family/caregiver present to determine baseline cognitive functioning Orientation Level: Oriented to person, Oriented to place, Oriented to situation, Disoriented to time Cognition Arousal/Alertness: Awake/alert Behavior During Therapy: WFL for tasks assessed/performed Overall Cognitive  Status: No family/caregiver present to determine baseline cognitive functioning Area of Impairment: Orientation, Memory, Safety/judgement Orientation Level: Disoriented to, Time, Situation Memory: Decreased short-term memory (this is pt's baseline) Safety/Judgement: Decreased awareness of safety, Decreased awareness of deficits General Comments: Pt is very lethargic this session, had just returned from x-ray.  Keeps eyes closed throughout most of session.  Knows where she is and her name but not why she is here or the date.  Physical Exam: Blood pressure 169/94, pulse 104, temperature 99.3 F (37.4 C), temperature source Oral, resp. rate 18, height '5\' 7"'  (1.702 m), weight 102.3 kg (225 lb 8.5 oz), SpO2 94 %. Physical Exam  Morbidly obese, NAD HENT: oral mucosa pink/moist Head: Normocephalic.  Eyes:  Pupils sluggish to light  Neck: Normal range of motion. Neck supple. No thyromegaly present.  Cardiovascular: Normal rate and regular rhythm.  Respiratory:  Decreased breath sounds at the bases but otherwise clear to auscultation GI: Soft. Bowel sounds are normal. She exhibits no distension.  Neurological:  Patient is alert, was talking on phone to daughter when I arrived. Speech remains dysarthric. Left central 7 and mild tongue deviation 2 + to 3- strength in the right deltoid biceps triceps 3   at the grip, 2 minus at the hip flexor, 2 knee extensor and ankle dorsal flexor bilaterally. 3- minus in the left deltoid, biceps, triceps, 3+ grip. LLE: 2hf, 3ke and adf/apf.  Senses pain in all 4. LUE and LLE 3+, no resting tone Psych: pt generally cooperative. Slightly anxious.   Results for orders placed or performed during the hospital encounter of 08/02/15 (from the past 48 hour(s))  Glucose, capillary     Status: Abnormal   Collection Time: 08/04/15  5:39 PM  Result Value Ref Range   Glucose-Capillary 121 (H) 65 - 99 mg/dL  Glucose, capillary     Status: Abnormal   Collection Time:  08/04/15  8:18 PM  Result Value Ref Range   Glucose-Capillary 146 (H) 65 - 99 mg/dL  Glucose, capillary     Status: Abnormal   Collection Time: 08/04/15 11:53 PM  Result Value Ref Range   Glucose-Capillary 137 (H) 65 - 99 mg/dL  Glucose, capillary     Status: Abnormal   Collection Time: 08/05/15  4:27 AM  Result Value Ref Range   Glucose-Capillary 120 (H) 65 - 99 mg/dL  Glucose, capillary     Status: Abnormal   Collection Time: 08/05/15  8:00 AM  Result Value Ref Range   Glucose-Capillary 106 (H) 65 - 99 mg/dL  CBC     Status: Abnormal   Collection Time: 08/05/15  8:20 AM  Result Value Ref Range   WBC 7.9 4.0 - 10.5 K/uL   RBC 4.04 3.87 - 5.11 MIL/uL   Hemoglobin 9.8 (L) 12.0 - 15.0 g/dL   HCT  31.2 (L) 36.0 - 46.0 %   MCV 77.2 (L) 78.0 - 100.0 fL   MCH 24.3 (L) 26.0 - 34.0 pg   MCHC 31.4 30.0 - 36.0 g/dL   RDW 19.0 (H) 11.5 - 15.5 %   Platelets 199 150 - 400 K/uL  Basic metabolic panel     Status: Abnormal   Collection Time: 08/05/15  8:20 AM  Result Value Ref Range   Sodium 143 135 - 145 mmol/L   Potassium 3.5 3.5 - 5.1 mmol/L   Chloride 112 (H) 101 - 111 mmol/L   CO2 21 (L) 22 - 32 mmol/L   Glucose, Bld 118 (H) 65 - 99 mg/dL   BUN 26 (H) 6 - 20 mg/dL   Creatinine, Ser 4.70 (H) 0.44 - 1.00 mg/dL   Calcium 8.8 (L) 8.9 - 10.3 mg/dL   GFR calc non Af Amer 9 (L) >60 mL/min   GFR calc Af Amer 11 (L) >60 mL/min    Comment: (NOTE) The eGFR has been calculated using the CKD EPI equation. This calculation has not been validated in all clinical situations. eGFR's persistently <60 mL/min signify possible Chronic Kidney Disease.    Anion gap 10 5 - 15  Glucose, capillary     Status: Abnormal   Collection Time: 08/05/15 12:12 PM  Result Value Ref Range   Glucose-Capillary 120 (H) 65 - 99 mg/dL  Glucose, capillary     Status: Abnormal   Collection Time: 08/05/15  4:51 PM  Result Value Ref Range   Glucose-Capillary 115 (H) 65 - 99 mg/dL  Glucose, capillary     Status:  Abnormal   Collection Time: 08/05/15  8:33 PM  Result Value Ref Range   Glucose-Capillary 124 (H) 65 - 99 mg/dL  Glucose, capillary     Status: Abnormal   Collection Time: 08/06/15 12:03 AM  Result Value Ref Range   Glucose-Capillary 126 (H) 65 - 99 mg/dL  Glucose, capillary     Status: Abnormal   Collection Time: 08/06/15  4:11 AM  Result Value Ref Range   Glucose-Capillary 132 (H) 65 - 99 mg/dL  CBC     Status: Abnormal   Collection Time: 08/06/15  6:00 AM  Result Value Ref Range   WBC 8.2 4.0 - 10.5 K/uL   RBC 3.89 3.87 - 5.11 MIL/uL   Hemoglobin 9.7 (L) 12.0 - 15.0 g/dL   HCT 30.3 (L) 36.0 - 46.0 %   MCV 77.9 (L) 78.0 - 100.0 fL   MCH 24.9 (L) 26.0 - 34.0 pg   MCHC 32.0 30.0 - 36.0 g/dL   RDW 19.1 (H) 11.5 - 15.5 %   Platelets 198 150 - 400 K/uL  Basic metabolic panel     Status: Abnormal   Collection Time: 08/06/15  6:00 AM  Result Value Ref Range   Sodium 145 135 - 145 mmol/L   Potassium 3.7 3.5 - 5.1 mmol/L   Chloride 112 (H) 101 - 111 mmol/L   CO2 23 22 - 32 mmol/L   Glucose, Bld 130 (H) 65 - 99 mg/dL   BUN 24 (H) 6 - 20 mg/dL   Creatinine, Ser 4.01 (H) 0.44 - 1.00 mg/dL   Calcium 8.8 (L) 8.9 - 10.3 mg/dL   GFR calc non Af Amer 11 (L) >60 mL/min   GFR calc Af Amer 13 (L) >60 mL/min    Comment: (NOTE) The eGFR has been calculated using the CKD EPI equation. This calculation has not been validated in all clinical situations.  eGFR's persistently <60 mL/min signify possible Chronic Kidney Disease.    Anion gap 10 5 - 15  Glucose, capillary     Status: Abnormal   Collection Time: 08/06/15  7:45 AM  Result Value Ref Range   Glucose-Capillary 124 (H) 65 - 99 mg/dL  Glucose, capillary     Status: Abnormal   Collection Time: 08/06/15 11:59 AM  Result Value Ref Range   Glucose-Capillary 237 (H) 65 - 99 mg/dL   Dg Swallowing Func-speech Pathology  08/05/2015  Objective Swallowing Evaluation:   Patient Details Name: Angelica Beck MRN: [id number] Date of Birth:  02/12/1951 Today's Date: 08/05/2015 Time: SLP Start Time (ACUTE ONLY): 1015-SLP Stop Time (ACUTE ONLY): 1045 SLP Time Calculation (min) (ACUTE ONLY): 30 min Past Medical History: Past Medical History Diagnosis Date . Hypertension  . Stroke ([place])  . Arthritis  . Diabetes mellitus, type 2 ([place])  . Hyperlipidemia  . Hyperthyroidism  . Obesity hypoventilation syndrome ([place]) 06/20/2013 Past Surgical History: Past Surgical History Procedure Laterality Date . Ventral hernia repair   HPI: Arliene Rosenow is a 64 y.o. female who has a PMH of HTN; CVA (LEFT parietal) ([place]); Arthritis; DM, type 2 ([place]); Hyperlipidemia; Hyperthyroidism; and Obesity hypoventilation syndrome ([place]), presented to [facility] 08/02/15 with AMS. Baseline dependent on daughter for ADLs, previous CVA resulted in short term memory deficits, daughter reports pt sometimes "spits up", episode of vomiting on Saturday. Currently being treated for possible UTI, acute kidney injury, CT indicated no acute infarcts, CXR mild central pulmonary vascular congestion and interstitial edema accentuated by borderline hypo inflation. There is no alveolar pneumonia nor pleural effusion. Not previously seen by SLP, currently NPO. No Data Recorded Assessment / Plan / Recommendation CHL IP CLINICAL IMPRESSIONS 08/05/2015 Therapy Diagnosis Severe oral phase dysphagia;Mild pharyngeal phase dysphagia Clinical Impression Pt demonstrated severe oral dysphagia and mild pharyngeal dysphagia due to AMS resulting in reduced awareness and coordination of bolus. Pt's oral phase is prolonged: pt holds bolus causing premature spillage of all consistencies and piecemeals thin and nectar thick liquids. Pt has a delayed swallow initiation with all consistencies resulting in unsensed penetration of boluses. Pt does not follow max verbal, tactile, and visual cues for second swallow to clear residue, but residue slightly improves with presentation of dry spoon to initiate second swallow, but is not  consistent to clear all residue. Penetrate is not consistently cleared with throat clear/ cough as pt does not follow cues to clear throat/cough consistently. SLP suspects pt's swallow function will improve as AMS improves, thin liquids are the safest when pt is presented small boluses. SLP recommends pt remain NPO, will f/u with further po trials to assess diet readiness.    CHL IP TREATMENT RECOMMENDATION 08/05/2015 Treatment Recommendations Therapy as outlined in treatment plan below   CHL IP DIET RECOMMENDATION 08/05/2015 SLP Diet Recommendations NPO Liquid Administration via -- Medication Administration Via alternative means Compensations -- Postural Changes --   No flowsheet data found.  CHL IP FOLLOW UP RECOMMENDATIONS 08/05/2015 Follow up Recommendations Skilled Nursing facility   [facility] IP FREQUENCY AND DURATION 08/05/2015 Speech Therapy Frequency (ACUTE ONLY) min 2x/week Treatment Duration 2 weeks      CHL IP ORAL PHASE 08/05/2015 Oral Phase Impaired Oral - Pudding Teaspoon -- Oral - Pudding Cup -- Oral - Honey Teaspoon -- Oral - Honey Cup -- Oral - Nectar Teaspoon -- Oral - Nectar Cup Left anterior bolus loss;Right anterior bolus loss;Holding of bolus;Piecemeal swallowing;Delayed oral transit Oral - Nectar Straw Left anterior bolus loss;Right anterior  bolus loss;Holding of bolus;Piecemeal swallowing;Delayed oral transit Oral - Thin Teaspoon -- Oral - Thin Cup Left anterior bolus loss;Right anterior bolus loss;Holding of bolus;Piecemeal swallowing;Delayed oral transit;Lingual/palatal residue Oral - Thin Straw -- Oral - Puree Left anterior bolus loss;Right anterior bolus loss;Holding of bolus;Piecemeal swallowing;Delayed oral transit Oral - Mech Soft -- Oral - Regular -- Oral - Multi-Consistency -- Oral - Pill -- Oral Phase - Comment --  CHL IP PHARYNGEAL PHASE 08/05/2015 Pharyngeal Phase Thin;Nectar;Solids Pharyngeal- Pudding Teaspoon -- Pharyngeal -- Pharyngeal- Pudding Cup -- Pharyngeal -- Pharyngeal- Honey  Teaspoon -- Pharyngeal -- Pharyngeal- Honey Cup -- Pharyngeal -- Pharyngeal- Nectar Teaspoon NT Pharyngeal -- Pharyngeal- Nectar Cup Delayed swallow initiation-vallecula;Pharyngeal residue - pyriform;Pharyngeal residue - valleculae;Penetration/Aspiration during swallow Pharyngeal Material enters airway, remains ABOVE vocal cords and not ejected out Pharyngeal- Nectar Straw Delayed swallow initiation-vallecula;Pharyngeal residue - valleculae;Pharyngeal residue - pyriform;Penetration/Aspiration during swallow Pharyngeal Material enters airway, remains ABOVE vocal cords and not ejected out Pharyngeal- Thin Teaspoon NT Pharyngeal -- Pharyngeal- Thin Cup Delayed swallow initiation-pyriform sinuses;Pharyngeal residue - pyriform Pharyngeal -- Pharyngeal- Thin Straw Delayed swallow initiation-pyriform sinuses;Pharyngeal residue - pyriform;Pharyngeal residue - valleculae Pharyngeal -- Pharyngeal- Puree Delayed swallow initiation-vallecula;Pharyngeal residue - valleculae;Pharyngeal residue - pyriform Pharyngeal -- Pharyngeal- Mechanical Soft NT Pharyngeal -- Pharyngeal- Regular NT Pharyngeal -- Pharyngeal- Multi-consistency NT Pharyngeal -- Pharyngeal- Pill NT Pharyngeal -- Pharyngeal Comment --  CHL IP CERVICAL ESOPHAGEAL PHASE 08/05/2015 Cervical Esophageal Phase WFL Pudding Teaspoon -- Pudding Cup -- Honey Teaspoon -- Honey Cup -- Nectar Teaspoon -- Nectar Cup -- Nectar Straw -- Thin Teaspoon -- Thin Cup -- Thin Straw -- Puree -- Mechanical Soft -- Regular -- Multi-consistency -- Pill -- Cervical Esophageal Comment -- Lanier Ensign, SLP Student Supervised and reviewed by Herbie Baltimore MA CCC-SLP [place], Katherene Ponto 08/05/2015, 2:41 PM                  Medical Problem List and Plan: 1. Functional deficits secondary to acute encephalopathy due to urosepsis with prior history of CVA (right pons, basal ganglia infarcts) 2.  DVT Prophylaxis/Anticoagulation: Subcutaneous heparin indicated. Monitor platelet counts  and any signs of bleeding 3. Pain Management: Tylenol as needed 4. Dysphagia. Dysphagia #2 thin liquids. Continued daily clinicaly reassessment   -advance per SLP  -encourage adequate po 5. Neuropsych: This patient is capable of making decisions on her own behalf. 6. Skin/Wound Care: Routine skin checks 7. Fluids/Electrolytes/Nutrition: Routine I&O with follow-up chemistries on Monday 8. Acute on chronic renal insufficiency. Latest creatinine 4.01 felt to be secondary to ATN and sepsis.  -all labs reviewed with Cr trending down over last 3 days  -encourage po fluids 9. Hypertension. bp's have been poorly controlled.  -utilize prn clonidine for bp> 200/100 10. Hyperlipidemia. Lipitor 11. Diabetes mellitus and peripheral neuropathy. Hemoglobin A1c 6.3. Liberalize diet. 12. Klebsiella/pseudomonas Urosepsis:  Continue Azactam  Post Admission Physician Evaluation: 1. Functional deficits secondary  to acute encephalopathy with subsequent deconditioning in the setting of prior CVA's. 2. Patient is admitted to receive collaborative, interdisciplinary care between the physiatrist, rehab nursing staff, and therapy team. 3. Patient's level of medical complexity and substantial therapy needs in context of that medical necessity cannot be provided at a lesser intensity of care such as a SNF. 4. Patient has experienced substantial functional loss from his/her baseline which was documented above under the "Functional History" and "Functional Status" headings.  Judging by the patient's diagnosis, physical exam, and functional history, the patient has potential for functional progress which will result in measurable gains  while on inpatient rehab.  These gains will be of substantial and practical use upon discharge  in facilitating mobility and self-care at the household level. 5. Physiatrist will provide 24 hour management of medical needs as well as oversight of the therapy plan/treatment and provide guidance  as appropriate regarding the interaction of the two. 6. 24 hour rehab nursing will assist with bladder management, bowel management, safety, skin/wound care, disease management, medication administration, pain management and patient education  and help integrate therapy concepts, techniques,education, etc. 7. PT will assess and treat for/with: Lower extremity strength, range of motion, stamina, balance, functional mobility, safety, adaptive techniques and equipment, NMR, cognitive perceptual awareness, family education, ego support.   Goals are: min to mod assist. 8. OT will assess and treat for/with: ADL's, functional mobility, safety, upper extremity strength, adaptive techniques and equipment, NMR, cognitive perceptual awareness, family ed, community reintegration.   Goals are: min to mod assist. Therapy may proceed with showering this patient. 9. SLP will assess and treat for/with: cognition, communication, family ed.  Goals are: supervision. 10. Case Management and Social Worker will assess and treat for psychological issues and discharge planning. 11. Team conference will be held weekly to assess progress toward goals and to determine barriers to discharge. 12. Patient will receive at least 3 hours of therapy per day at least 5 days per week. 13. ELOS: 11-14 days       14. Prognosis:  excellent     Meredith Staggers, MD, [place] [facility] 08/06/2015   08/06/2015

## 2015-08-06 NOTE — PMR Pre-admission (Signed)
PMR Admission Coordinator Pre-Admission Assessment  Patient: Angelica Beck is an 64 y.o., female MRN: [id number] DOB: [birth year] Height: '5\' 7"'$  (170.2 cm) Weight: 102.3 kg (225 lb 8.5 oz)              Insurance Information HMO: No    PPO:       PCP:       IPA:       80/20:       OTHER:   PRIMARY: Medicare A/B      Policy#: [id number]      Subscriber: [name] CM Name:        Phone#:       Fax#:   Pre-Cert#:        Employer: Disabled Benefits:  Phone #:       Name: Checked in [place]. Date: 01/24/96      Deduct: $1288      Out of Pocket Max: None      Life Max: unlimited CIR: 100%      SNF: 100 days Outpatient: 80%     Co-Pay: 20% Home Health: 100 %      Co-Pay: none DME: 80%     Co-Pay: 20% Providers: patient's choice  SECONDARY: Medicaid [place] access      Policy#: [id number]      Subscriber: [name] CM Name:        Phone#:       Fax#:   Pre-Cert#:        Employer:  Disabled Benefits:  Phone #:       Name:   Eff. Date:       Deduct:        Out of Pocket Max:        Life Max:   CIR:        SNF:   Outpatient:       Co-Pay:   Home Health:        Co-Pay:   DME:       Co-Pay:    Emergency Contact Information Contact Information    Name Relation Home Work Mobile   Tisdale,[name] Daughter [phone]       Current Medical History  Patient Admitting Diagnosis:  Encephalopathy/sepsis, debility  History of Present Illness: A 64 y.o. right handed female with history of hypertension, CVA with short-term memory deficits, diabetes mellitus peripheral neuropathy, hyperlipidemia, obesity hypoventilation syndrome, chronic renal insufficiency with creatinine 1.56 in May 2016. Patient lives with her daughter and has a home health aide 10 AM to 5 PM daily. Used a rolling walker mainly for pivot transfers prior to admission otherwise used a scooter. Aide assist with bathing and dressing and tub transfers. Over the past few weeks patient has needed assistance with feeding.  Presents of 08/02/2015 with nonproductive cough, hypotension, altered mental status with anxiety and restlessness. Noted admission creatinine 6.06 from baseline 1.56 in May 2016. Cranial CT scan showed atrophy widespread abnormal attenuation in the supratentorial white matter as well as lateral left cerebellum and mid pontine region more severe on the right than left. Prior infarct in the anterior left parietal lobe. No acute changes. Chest x-ray negative pneumonia. Abdominal films no acute findings. Renal ultrasound unremarkable. EEG showed no seizure activity. Renal insufficiency felt to be likely ATN secondary to hypotension and possible sepsis with latest creatinine 4.01. Urine culture Klebsiella as well as Pseudomonas and currently maintained on Azactam. Blood cultures negative. Maintain on Plavix for CVA prophylaxis as well as subcutaneous heparin for DVT prophylaxis.  Current workup is ongoing. Physical therapy evaluation completed 08/03/2015 with the recommendations of physical medicine rehabilitation consult. Patient to be admitted for comprehensive inpatient rehabilitation program.     Past Medical History  Past Medical History  Diagnosis Date  . Hypertension   . Stroke ([place])   . Arthritis   . Diabetes mellitus, type 2 ([place])   . Hyperlipidemia   . Hyperthyroidism   . Obesity hypoventilation syndrome ([place]) 06/20/2013    Family History  family history includes Diabetes in her mother.  Prior Rehab/Hospitalizations: Had HHPT 4-5 months ago after kidney illness with [facility] (daughter thinks).  Has the patient had major surgery during 100 days prior to admission? No  Current Medications   Current facility-administered medications:  .  acetaminophen (TYLENOL) tablet 650 mg, 650 mg, Oral, Q6H PRN **OR** acetaminophen (TYLENOL) suppository 650 mg, 650 mg, Rectal, Q6H PRN, Jones Bales, MD .  antiseptic oral rinse (CPC / CETYLPYRIDINIUM CHLORIDE 0.05%) solution 7 mL, 7 mL, Mouth Rinse, q12n4p,  Nischal Narendra, MD, 7 mL at 08/06/15 1116 .  atorvastatin (LIPITOR) tablet 40 mg, 40 mg, Oral, q1800, Kris Mouton, RPH, 40 mg at 08/03/15 1800 .  aztreonam (AZACTAM) 500 mg in dextrose 5 % 50 mL IVPB, 500 mg, Intravenous, 3 times per day, Cassie Jodean Lima, RPH, Last Rate: 100 mL/hr at 08/06/15 1115, 500 mg at 08/06/15 1115 .  bisacodyl (DULCOLAX) suppository 10 mg, 10 mg, Rectal, Daily, Jones Bales, MD, 10 mg at 08/06/15 0934 .  chlorhexidine (PERIDEX) 0.12 % solution 15 mL, 15 mL, Mouth Rinse, BID, Nischal Narendra, MD, 15 mL at 08/06/15 0934 .  clopidogrel (PLAVIX) tablet 75 mg, 75 mg, Oral, Daily, Jones Bales, MD, 75 mg at 08/06/15 0933 .  dextrose 5 %-0.45 % sodium chloride infusion, , Intravenous, Continuous, Shela Leff, MD, Last Rate: 50 mL/hr at 08/06/15 0938 .  dextrose 50 % solution 50 mL, 1 ampule, Intravenous, Once PRN, Jones Bales, MD .  famotidine (PEPCID) tablet 10 mg, 10 mg, Oral, BID, Jones Bales, MD, 10 mg at 08/06/15 0933 .  heparin injection 5,000 Units, 5,000 Units, Subcutaneous, 3 times per day, Jones Bales, MD, 5,000 Units at 08/06/15 0535 .  metoprolol (LOPRESSOR) injection 5 mg, 5 mg, Intravenous, 4 times per day, Tasrif Ahmed, MD, 5 mg at 08/06/15 1114 .  multivitamin with minerals tablet 1 tablet, 1 tablet, Oral, Daily, Jones Bales, MD, 1 tablet at 08/06/15 0934 .  niacin (NIASPAN) CR tablet 1,000 mg, 1,000 mg, Oral, QHS, Jones Bales, MD, 1,000 mg at 08/02/15 2228 .  nitroGLYCERIN (NITROSTAT) SL tablet 0.4 mg, 0.4 mg, Sublingual, Q5 min PRN, Jones Bales, MD .  promethazine (PHENERGAN) injection 6.25 mg, 6.25 mg, Intravenous, Q8H PRN, Shela Leff, MD, 6.25 mg at 08/03/15 1800 .  propylthiouracil (PTU) tablet 50 mg, 50 mg, Oral, BID, Jones Bales, MD, 50 mg at 08/06/15 0934 .  senna-docusate (Senokot-S) tablet 1 tablet, 1 tablet, Oral, QHS PRN, Jones Bales, MD  Patients Current Diet: DIET DYS 2 Room service  appropriate?: Yes; Fluid consistency:: Thin  Precautions / Restrictions Precautions Precautions: Fall Restrictions Weight Bearing Restrictions: No   Has the patient had 2 or more falls or a fall with injury in the past year?NoPatient had 1 fall in the past year 4-5 months ago related to kidney illness.  Prior Activity Level Household: Homebound.  Goes out about 2 X a month  Actor  Devices/Equipment: Bedside commode/3-in-1, Walker (specify type), Hospital bed Home Equipment: Walker - 2 wheels, Tub bench, Electric scooter, Bedside commode  Prior Device Use: Indicate devices/aids used by the patient prior to current illness, exacerbation or injury? Motorized wheelchair or scooter and Radio producer Level Prior Function Level of Independence: Needs assistance Gait / Transfers Assistance Needed: RW for mainly pivot transfers only.  Otherwise uses scooter. ADL's / Homemaking Assistance Needed: Aide assists with bathing and dressing and tub transfer.  Over the past few weeks pt has needed assist w/ feeding.  Self Care: Did the patient need help bathing, dressing, using the toilet or eating?  Needed some help  Indoor Mobility: Did the patient need assistance with walking from room to room (with or without device)? Needed some help  Stairs: Did the patient need assistance with internal or external stairs (with or without device)? Dependent  Functional Cognition: Did the patient need help planning regular tasks such as shopping or remembering to take medications? Dependent  Current Functional Level Cognition  Overall Cognitive Status: No family/caregiver present to determine baseline cognitive functioning Orientation Level: Oriented to person, Oriented to place, Oriented to situation, Disoriented to time Safety/Judgement: Decreased awareness of safety, Decreased awareness of deficits General Comments: Pt is very lethargic this session,  had just returned from x-ray.  Keeps eyes closed throughout most of session.  Knows where she is and her name but not why she is here or the date.    Extremity Assessment (includes Sensation/Coordination)  Upper Extremity Assessment: Generalized weakness, RUE deficits/detail, LUE deficits/detail RUE Deficits / Details: chorea present, not pt's baseline RUE Coordination: decreased gross motor LUE Deficits / Details: chorea present, not pt's baseline LUE Coordination: decreased gross motor  Lower Extremity Assessment: Generalized weakness, RLE deficits/detail, LLE deficits/detail RLE Coordination: decreased gross motor LLE Coordination: decreased gross motor    ADLs       Mobility  Overal bed mobility: Needs Assistance Bed Mobility: Rolling, Supine to Sit, Sit to Supine Rolling: Min assist Supine to sit: Mod assist Sit to supine: Mod assist General bed mobility comments: Rolling to right/left to assist with pericare. Mod A to elevate trunk and to bring BLEs into bed to return to supine. increased time/effort. heavy use of rails.    Transfers  Overall transfer level: Needs assistance Equipment used: Rolling walker (2 wheeled) Transfers: Sit to/from Stand, [employer] Transfers Sit to Stand: Mod assist Stand pivot transfers: Min assist General transfer comment: Mod A to boost from EOB with cues for hand placement/technique. Bil knee buckling x2 resulting on falling back onto bed. SPT bed to chair with Min A for balance/safety.     Ambulation / Gait / Stairs / Office manager / Balance Dynamic Sitting Balance Sitting balance - Comments: Mod>close min guard assist sitting EOB.  Pt sits for ~5 mins but has difficulty keeping her eyes open and fatigues quickly. RN made aware of pt's lethargic state Balance Overall balance assessment: Needs assistance Sitting-balance support: Feet supported, Bilateral upper extremity supported Sitting balance-Leahy Scale:  Fair Sitting balance - Comments: Mod>close min guard assist sitting EOB.  Pt sits for ~5 mins but has difficulty keeping her eyes open and fatigues quickly. RN made aware of pt's lethargic state Standing balance support: During functional activity Standing balance-Leahy Scale: Poor Standing balance comment: Relient on RW and external support (Min A)    Special needs/care consideration BiPAP/CPAP No CPM No5 Continuous Drip IV D5 and 0.45 %  NS at 50 ml/hr  Dialysis No        Life Vest No Oxygen Yes, uses 02 2L [place] at night at home Special Bed No Trach Size No Wound Vac (area) No     Skin Breakdown on inner thighs and on patient's bottom per daughter                           Bowel mgmt: Last BM 08/06/15 with incontinence Bladder mgmt: Voids with incontinence, wears depends at home Diabetic mgmt Yes, on oral medications and insulin at home.   Previous Home Environment Living Arrangements: Children Available Help at Discharge: Family, Personal care attendant, Available PRN/intermittently Type of Home: House Home Layout: Two level, Able to live on main level with bedroom/bathroom Alternate Level Stairs-Rails: Left Alternate Level Stairs-Number of Steps: 1 flight Home Access: Level entry [place]: Yes Type of Home Care Services: Homehealth aide Additional Comments: [name], pt's daughter, provided pt's information.  [name] providees pericare to pt in morning before going to work around [place] care attendant comes at [place] and stays til 5pm.  In the hours in between pt remains in bed where she either sleeps or watches TV.    Discharge Living Setting Plans for Discharge Living Setting: House, Lives with (comment) (Lives with her daughter.) Type of Home at Discharge: House Discharge Home Layout: Two level, 1/2 bath on main level, Bed/bath upstairs, Other (Comment) (Has a hospital bed on main level and BSC for patient.) Alternate Level Stairs-Number of Steps: Flight Discharge Home  Access: Stairs to enter [employer] of Steps: 1 step at garage entry and 2 steps at front entry Does the patient have any problems obtaining your medications?: No  Social/Family/Support Systems Patient Roles: Parent (Has a daughter.)  Has grandchildren ages 47, 57 and 38. Contact Information: [name] - daughter [phone] Anticipated Caregiver: Aide daily and daughter after work and nights Ability/Limitations of Caregiver: Daughter works 7 am to 3 pm.  Aide comes 10 am to 5 pm daily Caregiver Availability: Other (Comment) (May be alone 7 am to 10 am daily.) Discharge Plan Discussed with Primary Caregiver: Yes Is Caregiver In Agreement with Plan?: Yes Does Caregiver/Family have Issues with Lodging/Transportation while Pt is in Rehab?: No  Goals/Additional Needs Patient/Family Goal for Rehab:PT/OT/ST min/mod assist goals. Expected length of stay: 14 days Cultural Considerations: None Dietary Needs: Dys 2, thin liquids Equipment Needs: TBD Special Service Needs: Patient has an aide 10 am to 5 pm. Pt/Family Agrees to Admission and willing to participate: Yes Program Orientation Provided & Reviewed with Pt/Caregiver Including Roles  & Responsibilities: Yes  Decrease burden of Care through IP rehab admission:  N/A  Possible need for SNF placement upon discharge: Not planned  Patient Condition: This patient's medical and functional status has changed since the consult dated: 08/04/15 in which the Rehabilitation Physician determined and documented that the patient's condition is appropriate for intensive rehabilitative care in an inpatient rehabilitation facility. See "History of Present Illness" (above) for medical update. Functional changes are: Currently requiring min/mod assist for stand pivot transfers. Patient's medical and functional status update has been discussed with the Rehabilitation physician and patient remains appropriate for inpatient rehabilitation. Will admit  to inpatient rehab today.  Preadmission Screen Completed By:  Retta Diones, 08/06/2015 12:42 PM ______________________________________________________________________   Discussed status with Dr. Naaman Plummer on 08/06/15 at 1241 and received telephone approval for admission today.  Admission Coordinator:  Jodell Cipro  M, time1241/Date11/11/16

## 2015-08-06 NOTE — Progress Notes (Addendum)
Subjective: Patient was seen and examined at bedside this am. AAOx2. She is talking a lot more. She denies having any CP, SOB, or abdominal pain. Denies having pain anywhere else. No other complaints.    Objective: Vital signs in last 24 hours: Filed Vitals:   08/05/15 2034 08/06/15 0010 08/06/15 0521 08/06/15 0942  BP: 202/94 197/90 180/98   Pulse: 93 85 73   Temp: 99.3 F (37.4 C)  98.9 F (37.2 C)   TempSrc: Oral  Oral   Resp: 18  18   Height:    '5\' 7"'$  (1.702 m)  Weight:   225 lb 8.5 oz (102.3 kg)   SpO2: 100%  100%    Weight change: 0 lb (0 kg)  Intake/Output Summary (Last 24 hours) at 08/06/15 1246 Last data filed at 08/06/15 0853  Gross per 24 hour  Intake 776.67 ml  Output      0 ml  Net 776.67 ml   Physical Exam  Constitutional: She appears well-developed and well-nourished. No distress.  Cardiovascular: Normal rate, regular rhythm and intact distal pulses.   Pulmonary/Chest: Effort normal. No respiratory distress. She has no wheezes. She has no rales.  Abdominal: Soft. Bowel sounds are normal. She exhibits no distension. There is no tenderness.  Musculoskeletal: She exhibits no edema.  Neurological:  AAO x2  Following verbal commands Speech comprehensible   Skin: Skin is warm and dry.   Lab Results: Basic Metabolic Panel:  Recent Labs Lab 08/04/15 0338 08/05/15 0820 08/06/15 0600  NA 145 143 145  K 3.5 3.5 3.7  CL 111 112* 112*  CO2 22 21* 23  GLUCOSE 163* 118* 130*  BUN 32* 26* 24*  CREATININE 5.36* 4.70* 4.01*  CALCIUM 8.5* 8.8* 8.8*  PHOS 4.0  --   --    Liver Function Tests:  Recent Labs Lab 08/03/15 0326 08/04/15 0338  AST 10*  --   ALT 8*  --   ALKPHOS 42  --   BILITOT 0.8  --   PROT 6.3*  --   ALBUMIN 2.6* 2.4*   CBC:  Recent Labs Lab 08/02/15 0810  08/05/15 0820 08/06/15 0600  WBC 11.6*  < > 7.9 8.2  NEUTROABS 10.3*  --   --   --   HGB 10.7*  < > 9.8* 9.7*  HCT 34.3*  < > 31.2* 30.3*  MCV 80.0  < > 77.2* 77.9*  PLT  237  < > 199 198  < > = values in this interval not displayed. CBG:  Recent Labs Lab 08/05/15 1651 08/05/15 2033 08/06/15 0003 08/06/15 0411 08/06/15 0745 08/06/15 1159  GLUCAP 115* 124* 126* 132* 124* 237*   Hemoglobin A1C:  Recent Labs Lab 08/02/15 1150  HGBA1C 6.3*   Thyroid Function Tests:  Recent Labs Lab 08/02/15 1150 08/02/15 1443  TSH 0.158*  --   FREET4  --  1.46*   Urine Drug Screen: Drugs of Abuse     Component Value Date/Time   LABOPIA NONE DETECTED 08/02/2015 0935   COCAINSCRNUR NONE DETECTED 08/02/2015 0935   LABBENZ NONE DETECTED 08/02/2015 0935   AMPHETMU NONE DETECTED 08/02/2015 0935   THCU NONE DETECTED 08/02/2015 0935   LABBARB NONE DETECTED 08/02/2015 0935    Urinalysis:  Recent Labs Lab 08/02/15 0935  COLORURINE YELLOW  LABSPEC 1.012  PHURINE 5.5  GLUCOSEU NEGATIVE  HGBUR LARGE*  BILIRUBINUR NEGATIVE  KETONESUR 40*  PROTEINUR 30*  UROBILINOGEN 0.2  NITRITE NEGATIVE  LEUKOCYTESUR NEGATIVE   Micro Results:  Recent Results (from the past 240 hour(s))  Blood Culture (routine x 2)     Status: None (Preliminary result)   Collection Time: 08/02/15  8:45 AM  Result Value Ref Range Status   Specimen Description BLOOD LEFT HAND  Final   Special Requests BOTTLES DRAWN AEROBIC ONLY 5CC  Final   Culture NO GROWTH 3 DAYS  Final   Report Status PENDING  Incomplete  Blood Culture (routine x 2)     Status: None (Preliminary result)   Collection Time: 08/02/15  8:55 AM  Result Value Ref Range Status   Specimen Description BLOOD RIGHT HAND  Final   Special Requests BOTTLES DRAWN AEROBIC AND ANAEROBIC 5CC  Final   Culture NO GROWTH 3 DAYS  Final   Report Status PENDING  Incomplete  Urine culture     Status: None (Preliminary result)   Collection Time: 08/02/15  9:35 AM  Result Value Ref Range Status   Specimen Description URINE, CATHETERIZED  Final   Special Requests Normal  Final   Culture   Final    >=100,000 COLONIES/mL KLEBSIELLA  PNEUMONIAE >=100,000 COLONIES/mL PSEUDOMONAS FLUORESCENS SUSCEPTIBILITIES TO FOLLOW    Report Status PENDING  Incomplete   Organism ID, Bacteria KLEBSIELLA PNEUMONIAE  Final      Susceptibility   Klebsiella pneumoniae - MIC*    AMPICILLIN >=32 RESISTANT Resistant     CEFAZOLIN <=4 SENSITIVE Sensitive     CEFTRIAXONE <=1 SENSITIVE Sensitive     CIPROFLOXACIN <=0.25 SENSITIVE Sensitive     GENTAMICIN <=1 SENSITIVE Sensitive     IMIPENEM <=0.25 SENSITIVE Sensitive     NITROFURANTOIN 32 SENSITIVE Sensitive     TRIMETH/SULFA <=20 SENSITIVE Sensitive     AMPICILLIN/SULBACTAM 4 SENSITIVE Sensitive     PIP/TAZO <=4 SENSITIVE Sensitive     * >=100,000 COLONIES/mL KLEBSIELLA PNEUMONIAE  MRSA PCR Screening     Status: None   Collection Time: 08/02/15  1:48 PM  Result Value Ref Range Status   MRSA by PCR NEGATIVE NEGATIVE Final    Comment:        The GeneXpert MRSA Assay (FDA approved for NASAL specimens only), is one component of a comprehensive MRSA colonization surveillance program. It is not intended to diagnose MRSA infection nor to guide or monitor treatment for MRSA infections.    Studies/Results: Dg Swallowing Func-speech Pathology  08/05/2015  Objective Swallowing Evaluation:   Patient Details Name: Jean Alejos MRN: [id number] Date of Birth: 09/29/1950 Today's Date: 08/05/2015 Time: SLP Start Time (ACUTE ONLY): 1015-SLP Stop Time (ACUTE ONLY): 1045 SLP Time Calculation (min) (ACUTE ONLY): 30 min Past Medical History: Past Medical History Diagnosis Date . Hypertension  . Stroke ([place])  . Arthritis  . Diabetes mellitus, type 2 ([place])  . Hyperlipidemia  . Hyperthyroidism  . Obesity hypoventilation syndrome ([place]) 06/20/2013 Past Surgical History: Past Surgical History Procedure Laterality Date . Ventral hernia repair   HPI: Mannie Wineland is a 64 y.o. female who has a PMH of HTN; CVA (LEFT parietal) ([place]); Arthritis; DM, type 2 ([place]); Hyperlipidemia; Hyperthyroidism; and Obesity  hypoventilation syndrome ([place]), [facility] to [facility]02[facility]15 with AMS. Baseline dependent on daughter for ADLs, previous CVA resulted in short term memory deficits, daughter reports pt sometimes "spits up", episode of vomiting on Saturday. Currently being treated for possible UTI, acute kidney injury, CT indicated no acute infarcts, CXR mild central pulmonary vascular congestion and interstitial edema accentuated by borderline hypo inflation. There is no alveolar pneumonia nor pleural effusion. Not previously seen by SLP, currently  NPO. No Data Recorded Assessment / Plan / Recommendation CH[facility] IP CLINICAL IMPRESSIONS 08/05/2015 Therapy Diagnosis Severe oral phase dysphagia;Mild pharyngeal phase dysphagia Clinical Impression Pt demonstrated severe oral dysphagia and mild pharyngeal dysphagia due to AMS resulting in reduced awareness and coordination of bolus. Pt's oral phase is prolonged: pt holds bolus causing premature spillage of all consistencies and piecemeals thin and nectar thick liquids. Pt has a delayed swallow initiation with all consistencies resulting in unsensed penetration of boluses. Pt does not follow max verbal, tactile, and visual cues for second swallow to clear residue, but residue slightly improves with presentation of dry spoon to initiate second swallow, but is not consistent to clear all residue. Penetrate is not consistently cleared with throat clear/ cough as pt does not follow cues to clear throat/cough consistently. SLP suspects pt's swallow function will improve as AMS improves, thin liquids are the safest when pt is presented small boluses. SLP recommends pt remain NPO, will f/u with further po trials to assess diet readiness.    CHL IP TREATMENT RECOMMENDATION 08/05/2015 Treatment Recommendations Therapy as outlined in treatment plan below   CH[facility] IP DIET RECOMMENDATION 08/05/2015 SLP Diet Recommendations NPO Liquid Administration via -- Medication Administration Via alternative means  Compensations -- Postural Changes --   No flowsheet data found.  CHL IP FOLLOW UP RECOMMENDATIONS 08/05/2015 Follow up Recommendations Skilled Nursing facility   [facility] IP FREQUENCY AND DURATION 08/05/2015 Speech Therapy Frequency (ACUTE ONLY) min 2x/week Treatment Duration 2 weeks      CHL IP ORAL PHASE 08/05/2015 Oral Phase Impaired Oral - Pudding Teaspoon -- Oral - Pudding Cup -- Oral - Honey Teaspoon -- Oral - Honey Cup -- Oral - Nectar Teaspoon -- Oral - Nectar Cup Left anterior bolus loss;Right anterior bolus loss;Holding of bolus;Piecemeal swallowing;Delayed oral transit Oral - Nectar Straw Left anterior bolus loss;Right anterior bolus loss;Holding of bolus;Piecemeal swallowing;Delayed oral transit Oral - Thin Teaspoon -- Oral - Thin Cup Left anterior bolus loss;Right anterior bolus loss;Holding of bolus;Piecemeal swallowing;Delayed oral transit;Lingual/palatal residue Oral - Thin Straw -- Oral - Puree Left anterior bolus loss;Right anterior bolus loss;Holding of bolus;Piecemeal swallowing;Delayed oral transit Oral - Mech Soft -- Oral - Regular -- Oral - Multi-Consistency -- Oral - Pill -- Oral Phase - Comment --  CHL IP PHARYNGEAL PHASE 08/05/2015 Pharyngeal Phase Thin;Nectar;Solids Pharyngeal- Pudding Teaspoon -- Pharyngeal -- Pharyngeal- Pudding Cup -- Pharyngeal -- Pharyngeal- Honey Teaspoon -- Pharyngeal -- Pharyngeal- Honey Cup -- Pharyngeal -- Pharyngeal- Nectar Teaspoon NT Pharyngeal -- Pharyngeal- Nectar Cup Delayed swallow initiation-vallecula;Pharyngeal residue - pyriform;Pharyngeal residue - valleculae;Penetration/Aspiration during swallow Pharyngeal Material enters airway, remains ABOVE vocal cords and not ejected out Pharyngeal- Nectar Straw Delayed swallow initiation-vallecula;Pharyngeal residue - valleculae;Pharyngeal residue - pyriform;Penetration/Aspiration during swallow Pharyngeal Material enters airway, remains ABOVE vocal cords and not ejected out Pharyngeal- Thin Teaspoon NT Pharyngeal --  Pharyngeal- Thin Cup Delayed swallow initiation-pyriform sinuses;Pharyngeal residue - pyriform Pharyngeal -- Pharyngeal- Thin Straw Delayed swallow initiation-pyriform sinuses;Pharyngeal residue - pyriform;Pharyngeal residue - valleculae Pharyngeal -- Pharyngeal- Puree Delayed swallow initiation-vallecula;Pharyngeal residue - valleculae;Pharyngeal residue - pyriform Pharyngeal -- Pharyngeal- Mechanical Soft NT Pharyngeal -- Pharyngeal- Regular NT Pharyngeal -- Pharyngeal- Multi-consistency NT Pharyngeal -- Pharyngeal- Pill NT Pharyngeal -- Pharyngeal Comment --  CHL IP CERVICAL ESOPHAGEAL PHASE 08/05/2015 Cervical Esophageal Phase WFL Pudding Teaspoon -- Pudding Cup -- Honey Teaspoon -- Honey Cup -- Nectar Teaspoon -- Nectar Cup -- Nectar Straw -- Thin Teaspoon -- Thin Cup -- Thin Straw -- Puree -- Mechanical Soft -- Regular -- Multi-consistency --  Pill -- Cervical Esophageal Comment -- Lanier Ensign, SLP Student Supervised and reviewed by [facility] MA CCC-SLP [place], Katherene Ponto 08/05/2015, 2:41 PM              Medications: I have reviewed the patient's current medications. Scheduled Meds: . antiseptic oral rinse  7 mL Mouth Rinse q12n4p  . atorvastatin  40 mg Oral q1800  . aztreonam  500 mg Intravenous 3 times per day  . bisacodyl  10 mg Rectal Daily  . chlorhexidine  15 mL Mouth Rinse BID  . clopidogrel  75 mg Oral Daily  . famotidine  10 mg Oral BID  . heparin  5,000 Units Subcutaneous 3 times per day  . metoprolol  5 mg Intravenous 4 times per day  . multivitamin with minerals  1 tablet Oral Daily  . niacin  1,000 mg Oral QHS  . propylthiouracil  50 mg Oral BID   Continuous Infusions: . dextrose 5 % and 0.45% NaCl 50 mL/hr at 08/06/15 0938   PRN Meds:.acetaminophen **OR** acetaminophen, dextrose, nitroGLYCERIN, promethazine, senna-docusate Assessment/Plan: Principal Problem:   Acute encephalopathy Active Problems:   HYPERTENSION, BENIGN   CAD, NATIVE VESSEL   Obesity  hypoventilation syndrome (HCC)   AKI (acute kidney injury) ([place])   Hyperthyroidism   Type 2 diabetes mellitus (HCC)   Lactic acidosis   History of CVA (cerebrovascular accident)   Chronic systolic HF (heart failure) (HCC)   Cognitive impairment   Altered mental status  Acute encephalopathy and Sepsis 2/2 UTI Patient is currently AAOx2, following verbal commands, is much more alert and talking more. Her speech is comprehensible. She denies having any complaints. Denies having any CP, SOB, or abdominal pain. Denies having pain anywhere else. Patient does have baseline decreased mental status. CT of head showing atrophy with widespread abnormal attenuation in the supratentorial white matter as well as in the lateral left cerebellum and mid pontine region, more severe on the right than on the left. Prior infarct in the anterior left parietal lobe. A degree of superimposed demyelination cannot be excluded The overall appearance is essentially stable compared to the prior studies without demonstrable acute infarct. No acute hemorrhage or mass. EEG was normal. However, sepsis and worsening mentation likely in the setting of UTI because urine culture growing >100,000 cfu of Klebsiella pneumoniae. Urine culture also growing pseudomonas fluorescens but lab could not provide sensitivity (possible contaminant?). Blood culture x 2 showing no growth in 3 days. Renal US showing a 2.9 cm simple cyst on the lower pole of the L kidney. No hydronephrosis seen. T max 100.1 in the past 24 hrs. No leukocytosis. Patient was evaluated by SLP and her swallowing is better today. Patient is stable and will be discharged to inpatient rehab.  -Discontinue Aztreonam -Cipro 500 mg BID x 7 days -F/u final blood cx results   Hypoglycemia: Resolved. Blood glucose stable now.   Lactic acidosis: Resolved.  AKI on CKD: Likely from ATN in the setting of sepsis/ hypotension and continued use of mobic, enalapril, and lasix. Scr  improved to 4.01 today from peak 6.06. Patient's BL SCr is unknown but SCr was 1.5 during admission in May 2016.  -Holding NSAIDs, ACEi/ARB, and lasix.   Hyperthyroidism: TSH low, free T4 high. -continue PTU  HTN: BP 164/81 at present.  -continue Metoprolol  DM: A1c 6.3.   -Holding metformin due to recent AKI -SSI-S  Hx of CVA -Continue Plavix 75 mg daily   HLD: stable -continue Lipitor 40 mg daily  -  continue Niacin '1000mg'$  daily   GERD: stable -Pepcid 10 mg BID  Deconditioning: PT suggesting inpatient rehab.  Diet: Dysphagia 2  VTE ppx: Heparin subcutaneous   Dispo: Disposition is deferred at this time, awaiting improvement of current medical problems.  Anticipated discharge in approximately 0 day(s).   The patient does have a current PCP Delia Chimes, NP) and does need an [facility] hospital follow-up appointment after discharge.  The patient does not have transportation limitations that hinder transportation to clinic appointments.  .Services Needed at time of discharge: Y = Yes, Blank = No PT:   OT:   RN:   Equipment:   Other:     LOS: 4 days   Shela Leff, MD 08/06/2015, 12:46 PM

## 2015-08-06 NOTE — Progress Notes (Signed)
Angelica Blake, MD Physician Signed Physical Medicine and Rehabilitation Consult Note 08/04/2015 10:15 AM  Related encounter: ED to Hosp-Admission (Current) from 08/02/2015 in [facility] Collapse All        Physical Medicine and Rehabilitation Consult Reason for Consult: Suspect CVA Referring Physician: Internal medicine   HPI: Angelica Beck is a 64 y.o. right handed female with history of hypertension, CVA with short-term memory deficits, diabetes mellitus peripheral neuropathy, hyperlipidemia, obesity hypoventilation syndrome, chronic renal insufficiency with creatinine 1.56 in May 2016. Patient lives with her daughter and has a home health aide 10 AM to 5 PM daily. Used a rolling walker mainly for pivot transfers prior to admission otherwise used a scooter. Aide assist with bathing and dressing and tub transfers. Over the past few weeks patient and needed assistance with feeding. Presents of 08/02/2015 with nonproductive cough, hypotension, altered mental status with anxiety and restlessness. Noted admission creatinine 6.06 from baseline 1.56 in May 2016. Cranial CT scan showed atrophy widespread abnormal attenuation in the supratentorial white matter as well as lateral left cerebellum and mid pontine region more severe on the right than left. Prior infarct in the anterior left parietal lobe. No acute changes. Chest x-ray negative pneumonia. Abdominal films no acute findings. Renal ultrasound unremarkable. EEG showed no seizure activity. Renal insufficiency felt to be likely ATN secondary to hypotension and possible sepsis. Maintain on Plavix for CVA prophylaxis as well as subcutaneous heparin for DVT prophylaxis. Current workup is ongoing. Physical therapy evaluation completed 08/03/2015 with the recommendations of physical medicine rehabilitation consult.  The patient is poorly responsive. She has an obvious right facial droop. She indicates she  feels weaker on the right than on the left. She cannot tell me whether this is a new or Chronic issue  Review of Systems  Unable to perform ROS: mental acuity   Past Medical History  Diagnosis Date  . Hypertension   . Stroke ([place])   . Arthritis   . Diabetes mellitus, type 2 ([place])   . Hyperlipidemia   . Hyperthyroidism   . Obesity hypoventilation syndrome ([place]) 06/20/2013   Past Surgical History  Procedure Laterality Date  . Ventral hernia repair     Family History  Problem Relation Age of Onset  . Diabetes Mother    Social History:  reports that she quit smoking about 12 years ago. Her smoking use included Cigarettes. She has a 7.5 pack-year smoking history. She has never used smokeless tobacco. She reports that she does not drink alcohol or use illicit drugs. Allergies:  Allergies  Allergen Reactions  . Penicillins Swelling    Facial swelling   Medications Prior to Admission  Medication Sig Dispense Refill  . aspirin EC 81 MG tablet Take 1 tablet (81 mg total) by mouth daily. 30 tablet 0  . baclofen (LIORESAL) 10 MG tablet Take 10 mg by mouth 3 (three) times daily as needed. Muscle spasms    . Cholecalciferol (VITAMIN D) 2000 UNITS tablet Take 2,000 Units by mouth daily.    . clopidogrel (PLAVIX) 75 MG tablet Take 75 mg by mouth daily.     . diphenhydramine-acetaminophen (TYLENOL PM) 25-500 MG TABS Take 1 tablet by mouth at bedtime as needed (sleep).    . enalapril (VASOTEC) 20 MG tablet Take 20 mg by mouth daily.     . furosemide (LASIX) 40 MG tablet Take 40 mg by mouth every morning.    . gabapentin (NEURONTIN) 100 MG  capsule Take 100 mg by mouth at bedtime.     . insulin aspart (NOVOLOG) 100 UNIT/ML FlexPen Inject 15 Units into the skin as needed for high blood sugar (per sliding scale).     . insulin glargine (LANTUS) 100 unit/mL SOPN Inject 23 Units into the skin at  bedtime.    . insulin lispro (HUMALOG) 100 UNIT/ML KiwkPen Inject 15 Units into the skin 3 (three) times daily as needed (CBG 200 or more).    . meloxicam (MOBIC) 15 MG tablet Take 15 mg by mouth daily.    . metFORMIN (GLUCOPHAGE) 1000 MG tablet Take 1,000 mg by mouth 2 (two) times daily.    . metoprolol succinate (TOPROL-XL) 50 MG 24 hr tablet Take 50 mg by mouth daily.     . niacin (NIASPAN) 1000 MG CR tablet Take 1,000 mg by mouth at bedtime.     . nitroGLYCERIN (NITROSTAT) 0.4 MG SL tablet Place 1 tablet (0.4 mg total) under the tongue every 5 (five) minutes as needed for chest pain. 30 tablet 0  . oxybutynin (DITROPAN-XL) 10 MG 24 hr tablet Take 10 mg by mouth daily.     Marland Kitchen propylthiouracil (PTU) 50 MG tablet Take 50 mg by mouth 2 (two) times daily.     . ranitidine (ZANTAC) 150 MG tablet Take 150 mg by mouth 2 (two) times daily.     . rosuvastatin (CRESTOR) 40 MG tablet Take 40 mg by mouth at bedtime.    . traMADol (ULTRAM) 50 MG tablet Take 50 mg by mouth every 6 (six) hours as needed (pain).     . traZODone (DESYREL) 50 MG tablet Take 50 mg by mouth at bedtime as needed for sleep.     Marland Kitchen albuterol (PROVENTIL HFA;VENTOLIN HFA) 108 (90 BASE) MCG/ACT inhaler Inhale 2 puffs into the lungs every 6 (six) hours as needed for wheezing or shortness of breath.      Home: Home Living Family/patient expects to be discharged to:: Inpatient rehab Living Arrangements: Children Available Help at Discharge: Family, Personal care attendant, Available PRN/intermittently Type of Home: House Home Access: Level entry Home Layout: Two level, Able to live on main level with bedroom/bathroom Alternate Level Stairs-Number of Steps: 1 flight Alternate Level Stairs-Rails: Left Home Equipment: Walker - 2 wheels, Tub bench, Electric scooter, Bedside commode Additional Comments: [name], pt's daughter, provided pt's information. [name] providees  pericare to pt in morning before going to work around [place] care attendant comes at [place] and stays til 5pm. In the hours in between pt remains in bed where she either sleeps or watches TV.   Functional History: Prior Function Level of Independence: Needs assistance Gait / Transfers Assistance Needed: RW for mainly pivot transfers only. Otherwise uses scooter. ADL's / Homemaking Assistance Needed: Aide assists with bathing and dressing and tub transfer. Over the past few weeks pt has needed assist w/ feeding. Functional Status:  Mobility: Bed Mobility Overal bed mobility: Needs Assistance Bed Mobility: Supine to Sit, Sit to Supine Supine to sit: Mod assist, HOB elevated Sit to supine: +2 for physical assistance, Max assist General bed mobility comments: HHA assist for pulling up to sitting w/ pt pulling up w/ railing w/ Lt UE as well. Use of bed pad to scoot pt to EOB. +2 assist for returning back to supine w/ support provided to trunk and Bil LEs        ADL:    Cognition: Cognition Overall Cognitive Status: Impaired/Different from baseline Orientation Level: Oriented to person,  Oriented to place, Disoriented to time, Disoriented to situation Cognition Arousal/Alertness: Awake/alert Behavior During Therapy: Flat affect Overall Cognitive Status: Impaired/Different from baseline Area of Impairment: Orientation, Memory, Safety/judgement Orientation Level: Disoriented to, Person, Place, Time, Situation Memory: Decreased short-term memory (this is pt's baseline) Safety/Judgement: Decreased awareness of safety, Decreased awareness of deficits General Comments: Pt follows commands well. Pt reports her mother's last name as her own and says she was born in [place]. When asked where she is she says she is at her Daycare. Pt does not go to adult daycare per pt's daughter.  Blood pressure 179/99, pulse 95, temperature 98.3 F (36.8 C), temperature source Axillary, resp.  rate 19, weight 102.1 kg (225 lb 1.4 oz), SpO2 92 %. Physical Exam  HENT:  Head: Normocephalic.  Eyes:  Pupils sluggish to light  Neck: Normal range of motion. Neck supple. No thyromegaly present.  Cardiovascular: Normal rate and regular rhythm.  Respiratory:  Decreased breath sounds at the bases  GI: Soft. Bowel sounds are normal. She exhibits no distension.  Neurological:  Patient is lethargic but arousable. She did make eye contact. She was able to state her name. Speech was very dysarthric. Follows some inconsistent commands.  2 minus strength in the right deltoid biceps triceps 3 minus at the grip, 2 minus at the hip flexor knee extensor and ankle dorsal flexor bilaterally. 4 minus in the left deltoid, biceps, triceps, grip. Poor cooperation with manual muscle testing due to mental status Sensation withdraws to pinch in all 4 limbs but cannot do formal sensory testing due to mental status   Lab Results Last 24 Hours    Results for orders placed or performed during the hospital encounter of 08/02/15 (from the past 24 hour(s))  Glucose, capillary Status: Abnormal   Collection Time: 08/03/15 11:34 AM  Result Value Ref Range   Glucose-Capillary 157 (H) 65 - 99 mg/dL   Comment 1 Notify RN    Comment 2 Document in Chart   Glucose, capillary Status: Abnormal   Collection Time: 08/03/15 3:16 PM  Result Value Ref Range   Glucose-Capillary 140 (H) 65 - 99 mg/dL   Comment 1 Notify RN    Comment 2 Document in Chart   Glucose, capillary Status: Abnormal   Collection Time: 08/03/15 4:34 PM  Result Value Ref Range   Glucose-Capillary 118 (H) 65 - 99 mg/dL   Comment 1 Notify RN    Comment 2 Document in Chart   Glucose, capillary Status: Abnormal   Collection Time: 08/03/15 7:43 PM  Result Value Ref Range   Glucose-Capillary 149 (H) 65 - 99 mg/dL  Glucose, capillary Status: Abnormal   Collection  Time: 08/03/15 11:02 PM  Result Value Ref Range   Glucose-Capillary 152 (H) 65 - 99 mg/dL  CBC Status: Abnormal   Collection Time: 08/04/15 3:38 AM  Result Value Ref Range   WBC 8.6 4.0 - 10.5 K/uL   RBC 3.70 (L) 3.87 - 5.11 MIL/uL   Hemoglobin 9.0 (L) 12.0 - 15.0 g/dL   HCT 28.7 (L) 36.0 - 46.0 %   MCV 77.6 (L) 78.0 - 100.0 fL   MCH 24.3 (L) 26.0 - 34.0 pg   MCHC 31.4 30.0 - 36.0 g/dL   RDW 19.0 (H) 11.5 - 15.5 %   Platelets 214 150 - 400 K/uL  Renal function panel Status: Abnormal   Collection Time: 08/04/15 3:38 AM  Result Value Ref Range   Sodium 145 135 - 145 mmol/L   Potassium 3.5 3.5 -  5.1 mmol/L   Chloride 111 101 - 111 mmol/L   CO2 22 22 - 32 mmol/L   Glucose, Bld 163 (H) 65 - 99 mg/dL   BUN 32 (H) 6 - 20 mg/dL   Creatinine, Ser 5.36 (H) 0.44 - 1.00 mg/dL   Calcium 8.5 (L) 8.9 - 10.3 mg/dL   Phosphorus 4.0 2.5 - 4.6 mg/dL   Albumin 2.4 (L) 3.5 - 5.0 g/dL   GFR calc non Af Amer 8 (L) >60 mL/min   GFR calc Af Amer 9 (L) >60 mL/min   Anion gap 12 5 - 15  Glucose, capillary Status: Abnormal   Collection Time: 08/04/15 3:38 AM  Result Value Ref Range   Glucose-Capillary 156 (H) 65 - 99 mg/dL  Glucose, capillary Status: Abnormal   Collection Time: 08/04/15 7:16 AM  Result Value Ref Range   Glucose-Capillary 129 (H) 65 - 99 mg/dL   Comment 1 Notify RN    Comment 2 Document in Chart       Imaging Results (Last 48 hours)    Dg Abd 1 View  08/02/2015 CLINICAL DATA: Altered mental status, abdominal pain EXAM: ABDOMEN - 1 VIEW COMPARISON: 06/25/2010 FINDINGS: Large calcified fibroid in the right pelvis. Large stool burden throughout the colon. No obstruction or free air. No organomegaly or acute bony abnormality. IMPRESSION: Calcified fibroids. Large stool burden. No acute findings. Electronically Signed By: Rolm Baptise M.D. On: 08/02/2015 13:29   US Renal  08/03/2015 CLINICAL DATA: Acute renal failure. EXAM: RENAL / URINARY TRACT ULTRASOUND COMPLETE COMPARISON: None. FINDINGS: Right Kidney: Length: 12.5 cm. Echogenicity within normal limits. No mass or hydronephrosis visualized. Left Kidney: Length: 12.5 cm. Echogenicity within normal limits. No hydronephrosis visualized. 2.9 cm simple cyst lower pole left kidney. Bladder: Appears normal for degree of bladder distention. IMPRESSION: 2.9 cm simple cyst lower pole left kidney. Exam otherwise unremarkable. Electronically Signed By: Marcello Moores Register On: 08/03/2015 09:57     Assessment/Plan: Diagnosis: Right hemiparesis with chronic findings apparent on CT scan, also with severe cognitive deficits, in a patient with history of CVA requiring caregiver assistance at home. 1. Does the need for close, 24 hr/day medical supervision in concert with the patient's rehab needs make it unreasonable for this patient to be served in a less intensive setting? Potentially 2. Co-Morbidities requiring supervision/potential complications: Acute renal failure, possible sepsis, Diabetes with peripheral neuropathy 3. Due to bladder management, bowel management, safety, skin/wound care, disease management, medication administration, pain management and patient education, does the patient require 24 hr/day rehab nursing? Yes 4. Does the patient require coordinated care of a physician, rehab nurse, PTOT speech to address physical and functional deficits in the context of the above medical diagnosis(es)? Potentially Addressing deficits in the following areas: balance, endurance, locomotion, strength, transferring, bowel/bladder control, bathing, dressing, feeding, grooming, toileting, cognition, speech and swallowing 5. Can the patient actively participate in an intensive therapy program of at least 3 hrs of therapy per day at least 5 days per week? No 6. The potential for  patient to make measurable gains while on inpatient rehab is nnot applicable 7. Anticipated functional outcomes upon discharge from inpatient rehab are n/a with PT, n/a with OT, n/a with SLP. 8. Estimated rehab length of stay to reach the above functional goals is: Not applicable 9. Does the patient have adequate social supports and living environment to accommodate these discharge functional goals? Potentially 10. Anticipated D/C setting: home versus SNF depending on medical improvements 11. Anticipated post D/C treatments: hhome health  therapy after SNF 12. Overall Rehab/Functional Prognosis: poor  RECOMMENDATIONS: This patient's condition is appropriate for continued rehabilitative care in the following setting: patient lacks the ability to participate in an intensive replantation program due to lethargy. Patient has agreed to participate in recommended program. N/A Note that insurance prior authorization may be required for reimbursement for recommended care.  Comment: Need to establish baseline functional status, question MRI to look for an acute infarct    08/04/2015       Revision History     Date/Time User Provider Type Action   08/04/2015 12:02 PM Angelica Blake, MD Physician Sign   08/04/2015 10:34 AM Cathlyn Parsons, PA-C Physician Assistant Pend   View Details Report       Routing History     Date/Time From To Method   08/04/2015 12:02 PM Angelica Blake, MD Angelica Blake, MD In Basket   08/04/2015 12:02 PM Angelica Blake, MD Delia Chimes, NP Fax

## 2015-08-06 NOTE — Progress Notes (Signed)
ANTIBIOTIC CONSULT NOTE - FOLLOW UP  Pharmacy Consult for Aztreonam Indication: UTI  Allergies  Allergen Reactions  . Penicillins Swelling    Facial swelling    Patient Measurements: Height: '5\' 7"'$  (170.2 cm) Weight: 225 lb 8.5 oz (102.3 kg) IBW/kg (Calculated) : 61.6  Vital Signs: Temp: 98.9 F (37.2 C) (11/11 0521) Temp Source: Oral (11/11 0521) BP: 180/98 mmHg (11/11 0521) Pulse Rate: 73 (11/11 0521) Intake/Output from previous day: 11/10 0701 - 11/11 0700 In: 776.7 [I.V.:626.7; IV Piggyback:150] Out: -  Intake/Output from this shift: Total I/O In: 566.7 [P.O.:360; I.V.:156.7; IV Piggyback:50] Out: -   Labs:  Recent Labs  08/04/15 0338 08/05/15 0820 08/06/15 0600  WBC 8.6 7.9 8.2  HGB 9.0* 9.8* 9.7*  PLT 214 199 198  CREATININE 5.36* 4.70* 4.01*   Estimated Creatinine Clearance: 17.9 mL/min (by C-G formula based on Cr of 4.01). No results for input(s): VANCOTROUGH, VANCOPEAK, VANCORANDOM, GENTTROUGH, GENTPEAK, GENTRANDOM, TOBRATROUGH, TOBRAPEAK, TOBRARND, AMIKACINPEAK, AMIKACINTROU, AMIKACIN in the last 72 hours.   Microbiology: Recent Results (from the past 720 hour(s))  Blood Culture (routine x 2)     Status: None (Preliminary result)   Collection Time: 08/02/15  8:45 AM  Result Value Ref Range Status   Specimen Description BLOOD LEFT HAND  Final   Special Requests BOTTLES DRAWN AEROBIC ONLY 5CC  Final   Culture NO GROWTH 3 DAYS  Final   Report Status PENDING  Incomplete  Blood Culture (routine x 2)     Status: None (Preliminary result)   Collection Time: 08/02/15  8:55 AM  Result Value Ref Range Status   Specimen Description BLOOD RIGHT HAND  Final   Special Requests BOTTLES DRAWN AEROBIC AND ANAEROBIC 5CC  Final   Culture NO GROWTH 3 DAYS  Final   Report Status PENDING  Incomplete  Urine culture     Status: None (Preliminary result)   Collection Time: 08/02/15  9:35 AM  Result Value Ref Range Status   Specimen Description URINE, CATHETERIZED   Final   Special Requests Normal  Final   Culture   Final    >=100,000 COLONIES/mL KLEBSIELLA PNEUMONIAE >=100,000 COLONIES/mL PSEUDOMONAS FLUORESCENS SUSCEPTIBILITIES TO FOLLOW    Report Status PENDING  Incomplete   Organism ID, Bacteria KLEBSIELLA PNEUMONIAE  Final      Susceptibility   Klebsiella pneumoniae - MIC*    AMPICILLIN >=32 RESISTANT Resistant     CEFAZOLIN <=4 SENSITIVE Sensitive     CEFTRIAXONE <=1 SENSITIVE Sensitive     CIPROFLOXACIN <=0.25 SENSITIVE Sensitive     GENTAMICIN <=1 SENSITIVE Sensitive     IMIPENEM <=0.25 SENSITIVE Sensitive     NITROFURANTOIN 32 SENSITIVE Sensitive     TRIMETH/SULFA <=20 SENSITIVE Sensitive     AMPICILLIN/SULBACTAM 4 SENSITIVE Sensitive     PIP/TAZO <=4 SENSITIVE Sensitive     * >=100,000 COLONIES/mL KLEBSIELLA PNEUMONIAE  MRSA PCR Screening     Status: None   Collection Time: 08/02/15  1:48 PM  Result Value Ref Range Status   MRSA by PCR NEGATIVE NEGATIVE Final    Comment:        The GeneXpert MRSA Assay (FDA approved for NASAL specimens only), is one component of a comprehensive MRSA colonization surveillance program. It is not intended to diagnose MRSA infection nor to guide or monitor treatment for MRSA infections.     Anti-infectives    Start     Dose/Rate Route Frequency Ordered Stop   08/06/15 0000  ciprofloxacin (CIPRO) 500 MG tablet  500 mg Oral 2 times daily 08/06/15 1252     08/04/15 1200  vancomycin (VANCOCIN) 1,500 mg in sodium chloride 0.9 % 500 mL IVPB  Status:  Discontinued     1,500 mg 250 mL/hr over 120 Minutes Intravenous Every 48 hours 08/03/15 1017 08/04/15 1004   08/04/15 0800  levofloxacin (LEVAQUIN) IVPB 500 mg  Status:  Discontinued     500 mg 100 mL/hr over 60 Minutes Intravenous Every 48 hours 08/02/15 0917 08/03/15 1112   08/02/15 1730  aztreonam (AZACTAM) 500 mg in dextrose 5 % 50 mL IVPB     500 mg 100 mL/hr over 30 Minutes Intravenous 3 times per day 08/02/15 0917     08/02/15 1015   vancomycin (VANCOCIN) IVPB 1000 mg/200 mL premix  Status:  Discontinued     1,000 mg 200 mL/hr over 60 Minutes Intravenous  Once 08/02/15 0905 08/02/15 0910   08/02/15 0915  vancomycin (VANCOCIN) 2,000 mg in sodium chloride 0.9 % 500 mL IVPB     2,000 mg 250 mL/hr over 120 Minutes Intravenous  Once 08/02/15 0910 08/02/15 1422   08/02/15 0830  levofloxacin (LEVAQUIN) IVPB 750 mg     750 mg 100 mL/hr over 90 Minutes Intravenous  Once 08/02/15 0828 08/02/15 1032   08/02/15 0830  aztreonam (AZACTAM) 2 g in dextrose 5 % 50 mL IVPB     2 g 100 mL/hr over 30 Minutes Intravenous  Once 08/02/15 0828 08/02/15 1129   08/02/15 0830  vancomycin (VANCOCIN) IVPB 1000 mg/200 mL premix  Status:  Discontinued     1,000 mg 200 mL/hr over 60 Minutes Intravenous  Once 08/02/15 4540 08/02/15 0910      Assessment: 64 yo F initially admitted with sepsis and ARF.  Pt hs clinically improved and is planned to transfer to inpatient rehab today.  Urine cx = Kleb PNA and pseudomonas.  Klebsiella is pan sensitive except Ampicillin.  Sensitivities for pseudomonas are still pending.  Hopeful that patient will be able to transition to PO Cipro to cover both organisms.  To continue IV Aztreonam for now.  Today is day#5 of therapy.  Renal function also improving.  SCr 4 (baseline 1.5).  Will continue to evaluate medications and adjust for changes in renal function.  Goal of Therapy:  Eradication of Infection  Plan:  Continue Aztreonam '500mg'$  IV q8h Follow up pseudomonas sensitivities  [employer], Pharm.D., BCPS Clinical Pharmacist Pager [phone] 08/06/2015 1:29 PM

## 2015-08-06 NOTE — Progress Notes (Signed)
Pt transferred to Rehab from [phone]. Alert and orientated x 3. Previous pt on Rehab in the past, therefore aware of Rehab process. Pt looking forward to starting Rehab in the a.m

## 2015-08-06 NOTE — Discharge Summary (Signed)
Name: Angelica Beck MRN: [id number] DOB: 06-02-1951 64 y.o. PCP: Angelica Chimes, NP  Date of Admission: 08/02/2015  7:25 AM Date of Discharge: 08/06/2015 Attending Physician: Aldine Contes, MD  Discharge Diagnosis:  Principal Problem:   Acute encephalopathy Active Problems:   HYPERTENSION, BENIGN   CAD, NATIVE VESSEL   Obesity hypoventilation syndrome (HCC)   AKI (acute kidney injury) ([place])   Hyperthyroidism   Type 2 diabetes mellitus (HCC)   Lactic acidosis   History of CVA (cerebrovascular accident)   Chronic systolic HF (heart failure) (HCC)   Cognitive impairment   Altered mental status  Discharge Medications:   Medication List    STOP taking these medications        enalapril 20 MG tablet  Commonly known as:  VASOTEC     furosemide 40 MG tablet  Commonly known as:  LASIX     meloxicam 15 MG tablet  Commonly known as:  MOBIC     metFORMIN 1000 MG tablet  Commonly known as:  GLUCOPHAGE      TAKE these medications        albuterol 108 (90 BASE) MCG/ACT inhaler  Commonly known as:  PROVENTIL HFA;VENTOLIN HFA  Inhale 2 puffs into the lungs every 6 (six) hours as needed for wheezing or shortness of breath.     aspirin EC 81 MG tablet  Take 1 tablet (81 mg total) by mouth daily.     baclofen 10 MG tablet  Commonly known as:  LIORESAL  Take 10 mg by mouth 3 (three) times daily as needed. Muscle spasms     ciprofloxacin 500 MG tablet  Commonly known as:  CIPRO  Take 1 tablet (500 mg total) by mouth 2 (two) times daily.     clopidogrel 75 MG tablet  Commonly known as:  PLAVIX  Take 75 mg by mouth daily.     diphenhydramine-acetaminophen 25-500 MG Tabs tablet  Commonly known as:  TYLENOL PM  Take 1 tablet by mouth at bedtime as needed (sleep).     gabapentin 100 MG capsule  Commonly known as:  NEURONTIN  Take 100 mg by mouth at bedtime.     insulin aspart 100 UNIT/ML FlexPen  Commonly known as:  NOVOLOG  Inject 15 Units into the skin as needed  for high blood sugar (per sliding scale).     insulin glargine 100 unit/mL Sopn  Commonly known as:  LANTUS  Inject 23 Units into the skin at bedtime.     insulin lispro 100 UNIT/ML KiwkPen  Commonly known as:  HUMALOG  Inject 15 Units into the skin 3 (three) times daily as needed (CBG 200 or more).     metoprolol succinate 50 MG 24 hr tablet  Commonly known as:  TOPROL-XL  Take 50 mg by mouth daily.     niacin 1000 MG CR tablet  Commonly known as:  NIASPAN  Take 1,000 mg by mouth at bedtime.     nitroGLYCERIN 0.4 MG SL tablet  Commonly known as:  NITROSTAT  Place 1 tablet (0.4 mg total) under the tongue every 5 (five) minutes as needed for chest pain.     oxybutynin 10 MG 24 hr tablet  Commonly known as:  DITROPAN-XL  Take 10 mg by mouth daily.     propylthiouracil 50 MG tablet  Commonly known as:  PTU  Take 50 mg by mouth 2 (two) times daily.     ranitidine 150 MG tablet  Commonly known as:  ZANTAC  Take  150 mg by mouth 2 (two) times daily.     rosuvastatin 40 MG tablet  Commonly known as:  CRESTOR  Take 40 mg by mouth at bedtime.     traMADol 50 MG tablet  Commonly known as:  ULTRAM  Take 50 mg by mouth every 6 (six) hours as needed (pain).     traZODone 50 MG tablet  Commonly known as:  DESYREL  Take 50 mg by mouth at bedtime as needed for sleep.     Vitamin D 2000 UNITS tablet  Take 2,000 Units by mouth daily.        Disposition and follow-up:   Ms.Angelica Beck was discharged from [facility] in Good condition.  At the hospital follow up visit please address:  1.  Holding NSAIDs, ACEi/ARB, and lasix in the setting of AKI.   Continue Cipro 500 mg BID for 7 days  2.  Labs / imaging needed at time of follow-up: BMP in 5 days  3.  Pending labs/ test needing follow-up: final blood culture results   Follow-up Appointments: PM&R  Discharge Instructions:     Discharge Instructions    Increase activity slowly    Complete by:  As  directed            Consultations:      Procedures Performed:  Dg Chest 2 View  08/04/2015  CLINICAL DATA:  Cough, chest congestion EXAM: CHEST  2 VIEW COMPARISON:  08/02/15 FINDINGS: Stable cardiac enlargement and vascular congestion. Mild interstitial edema is mildly worse when compared with the prior study. No effusion. IMPRESSION: Mildly worse congestive heart failure Electronically Signed   By: Skipper Cliche M.D.   On: 08/04/2015 12:00   Dg Chest 2 View  08/02/2015  CLINICAL DATA:  Altered mental status, hypoglycemia, coronary artery disease EXAM: CHEST  2 VIEW COMPARISON:  PA and lateral chest x-ray of Feb 08, 2015 FINDINGS: The lungs are borderline hypoinflated. The interstitial markings are coarse. The central pulmonary vascularity is mildly prominent. The cardiac silhouette is top-normal in size. There is no pleural effusion or alveolar infiltrate. There is mild multilevel degenerative disc disease of the thoracic spine. IMPRESSION: Mild central pulmonary vascular congestion and interstitial edema accentuated by borderline hypo inflation. There is no alveolar pneumonia nor pleural effusion. Electronically Signed   By: David  Martinique M.D.   On: 08/02/2015 08:35   Dg Abd 1 View  08/02/2015  CLINICAL DATA:  Altered mental status, abdominal pain EXAM: ABDOMEN - 1 VIEW COMPARISON:  06/25/2010 FINDINGS: Large calcified fibroid in the right pelvis. Large stool burden throughout the colon. No obstruction or free air. No organomegaly or acute bony abnormality. IMPRESSION: Calcified fibroids. Large stool burden. No acute findings. Electronically Signed   By: Rolm Baptise M.D.   On: 08/02/2015 13:29   Ct Head Wo Contrast  08/02/2015  CLINICAL DATA:  Altered mental status EXAM: CT HEAD WITHOUT CONTRAST TECHNIQUE: Contiguous axial images were obtained from the base of the skull through the vertex without intravenous contrast. COMPARISON:  Head CT July 03, 2009 and brain MRI July 06, 2009  FINDINGS: Mild generalized atrophy remain stable. There is no intracranial mass, hemorrhage, extra-axial fluid collection, or midline shift. There is decreased attenuation throughout the centra semiovale bilaterally. There are multiple foci of decreased attenuation in the periventricular white matter which are oriented somewhat perpendicular to the lateral ventricles. There is evidence of a stable prior infarct at the gray -white junction of the anterior left parietal lobe.  There are foci of decreased attenuation in both internal capsules, stable. Decreased attenuation is also noted in the left thalamus and in the mid pontine region, more on the right than on the left. There is decreased attenuation in the periphery of the left cerebellum, mid to superior aspect, stable. No acute infarct is appreciable. The bony calvarium appears intact. The mastoid air cells are clear. There is cerumen in the right external auditory canal. IMPRESSION: Atrophy with widespread abnormal attenuation in the supratentorial white matter as well as in the lateral left cerebellum and mid pontine region, more severe on the right than on the left. Prior infarct in the anterior left parietal lobe. All of these changes may be due to small vessel vascular disease. Given that several areas of decreased attenuation in the periventricular white matter are oriented perpendicular to the lateral ventricles, a degree of superimposed demyelination cannot be excluded. The overall appearance is essentially stable compared to the prior studies without demonstrable acute infarct. No acute hemorrhage or mass. No extra-axial fluid. Cerumen noted in the right external auditory canal. Electronically Signed   By: Lowella Grip III M.D.   On: 08/02/2015 09:24   US Renal  08/03/2015  CLINICAL DATA:  Acute renal failure. EXAM: RENAL / URINARY TRACT ULTRASOUND COMPLETE COMPARISON:  None. FINDINGS: Right Kidney: Length: 12.5 cm. Echogenicity within normal  limits. No mass or hydronephrosis visualized. Left Kidney: Length: 12.5 cm. Echogenicity within normal limits. No hydronephrosis visualized. 2.9 cm simple cyst lower pole left kidney. Bladder: Appears normal for degree of bladder distention. IMPRESSION: 2.9 cm simple cyst lower pole left kidney. Exam otherwise unremarkable. Electronically Signed   By: Marcello Moores  Register   On: 08/03/2015 09:57   Dg Swallowing Func-speech Pathology  08/05/2015  Objective Swallowing Evaluation:   Patient Details Name: Angelica Beck MRN: [id number] Date of Birth: 1951-03-16 Today's Date: 08/05/2015 Time: SLP Start Time (ACUTE ONLY): 1015-SLP Stop Time (ACUTE ONLY): 1045 SLP Time Calculation (min) (ACUTE ONLY): 30 min Past Medical History: Past Medical History Diagnosis Date . Hypertension  . Stroke ([place])  . Arthritis  . Diabetes mellitus, type 2 ([place])  . Hyperlipidemia  . Hyperthyroidism  . Obesity hypoventilation syndrome ([place]) 06/20/2013 Past Surgical History: Past Surgical History Procedure Laterality Date . Ventral hernia repair   HPI: Layah Skousen is a 64 y.o. female who has a PMH of HTN; CVA (LEFT parietal) ([place]); Arthritis; DM, type 2 ([place]); Hyperlipidemia; Hyperthyroidism; and Obesity hypoventilation syndrome ([place]), presented to [facility] 08/02/15 with AMS. Baseline dependent on daughter for ADLs, previous CVA resulted in short term memory deficits, daughter reports pt sometimes "spits up", episode of vomiting on Saturday. Currently being treated for possible UTI, acute kidney injury, CT indicated no acute infarcts, CXR mild central pulmonary vascular congestion and interstitial edema accentuated by borderline hypo inflation. There is no alveolar pneumonia nor pleural effusion. Not previously seen by SLP, currently NPO. No Data Recorded Assessment / Plan / Recommendation CHL IP CLINICAL IMPRESSIONS 08/05/2015 Therapy Diagnosis Severe oral phase dysphagia;Mild pharyngeal phase dysphagia Clinical Impression Pt demonstrated severe  oral dysphagia and mild pharyngeal dysphagia due to AMS resulting in reduced awareness and coordination of bolus. Pt's oral phase is prolonged: pt holds bolus causing premature spillage of all consistencies and piecemeals thin and nectar thick liquids. Pt has a delayed swallow initiation with all consistencies resulting in unsensed penetration of boluses. Pt does not follow max verbal, tactile, and visual cues for second swallow to clear residue, but residue slightly improves with presentation of  dry spoon to initiate second swallow, but is not consistent to clear all residue. Penetrate is not consistently cleared with throat clear/ cough as pt does not follow cues to clear throat/cough consistently. SLP suspects pt's swallow function will improve as AMS improves, thin liquids are the safest when pt is presented small boluses. SLP recommends pt remain NPO, will f/u with further po trials to assess diet readiness.    CHL IP TREATMENT RECOMMENDATION 08/05/2015 Treatment Recommendations Therapy as outlined in treatment plan below   CHL IP DIET RECOMMENDATION 08/05/2015 SLP Diet Recommendations NPO Liquid Administration via -- Medication Administration Via alternative means Compensations -- Postural Changes --   No flowsheet data found.  CHL IP FOLLOW UP RECOMMENDATIONS 08/05/2015 Follow up Recommendations Skilled Nursing facility   [facility] IP FREQUENCY AND DURATION 08/05/2015 Speech Therapy Frequency (ACUTE ONLY) min 2x/week Treatment Duration 2 weeks      CHL IP ORAL PHASE 08/05/2015 Oral Phase Impaired Oral - Pudding Teaspoon -- Oral - Pudding Cup -- Oral - Honey Teaspoon -- Oral - Honey Cup -- Oral - Nectar Teaspoon -- Oral - Nectar Cup Left anterior bolus loss;Right anterior bolus loss;Holding of bolus;Piecemeal swallowing;Delayed oral transit Oral - Nectar Straw Left anterior bolus loss;Right anterior bolus loss;Holding of bolus;Piecemeal swallowing;Delayed oral transit Oral - Thin Teaspoon -- Oral - Thin Cup Left  anterior bolus loss;Right anterior bolus loss;Holding of bolus;Piecemeal swallowing;Delayed oral transit;Lingual/palatal residue Oral - Thin Straw -- Oral - Puree Left anterior bolus loss;Right anterior bolus loss;Holding of bolus;Piecemeal swallowing;Delayed oral transit Oral - Mech Soft -- Oral - Regular -- Oral - Multi-Consistency -- Oral - Pill -- Oral Phase - Comment --  CHL IP PHARYNGEAL PHASE 08/05/2015 Pharyngeal Phase Thin;Nectar;Solids Pharyngeal- Pudding Teaspoon -- Pharyngeal -- Pharyngeal- Pudding Cup -- Pharyngeal -- Pharyngeal- Honey Teaspoon -- Pharyngeal -- Pharyngeal- Honey Cup -- Pharyngeal -- Pharyngeal- Nectar Teaspoon NT Pharyngeal -- Pharyngeal- Nectar Cup Delayed swallow initiation-vallecula;Pharyngeal residue - pyriform;Pharyngeal residue - valleculae;Penetration/Aspiration during swallow Pharyngeal Material enters airway, remains ABOVE vocal cords and not ejected out Pharyngeal- Nectar Straw Delayed swallow initiation-vallecula;Pharyngeal residue - valleculae;Pharyngeal residue - pyriform;Penetration/Aspiration during swallow Pharyngeal Material enters airway, remains ABOVE vocal cords and not ejected out Pharyngeal- Thin Teaspoon NT Pharyngeal -- Pharyngeal- Thin Cup Delayed swallow initiation-pyriform sinuses;Pharyngeal residue - pyriform Pharyngeal -- Pharyngeal- Thin Straw Delayed swallow initiation-pyriform sinuses;Pharyngeal residue - pyriform;Pharyngeal residue - valleculae Pharyngeal -- Pharyngeal- Puree Delayed swallow initiation-vallecula;Pharyngeal residue - valleculae;Pharyngeal residue - pyriform Pharyngeal -- Pharyngeal- Mechanical Soft NT Pharyngeal -- Pharyngeal- Regular NT Pharyngeal -- Pharyngeal- Multi-consistency NT Pharyngeal -- Pharyngeal- Pill NT Pharyngeal -- Pharyngeal Comment --  CHL IP CERVICAL ESOPHAGEAL PHASE 08/05/2015 Cervical Esophageal Phase WFL Pudding Teaspoon -- Pudding Cup -- Honey Teaspoon -- Honey Cup -- Nectar Teaspoon -- Nectar Cup -- Nectar Straw --  Thin Teaspoon -- Thin Cup -- Thin Straw -- Puree -- Mechanical Soft -- Regular -- Multi-consistency -- Pill -- Cervical Esophageal Comment -- Lanier Ensign, SLP Student Supervised and reviewed by Herbie Baltimore MA CCC-SLP [place], Katherene Ponto 08/05/2015, 2:41 PM              Admission HPI: Allysia Ingles is a 64 y.o. female who has a past medical history of Hypertension; Stroke ([place]); Arthritis; Diabetes mellitus, type 2 ([place]); Hyperlipidemia; Hyperthyroidism; and Obesity hypoventilation syndrome ([place]) (06/20/2013).   Pt presents to the ED with altered mental status. Most of the history is provided by the daughter as the patient is altered. [name] (pt's daughter), reports pt was previously fine yesterday  except for a non-productive cough on Saturday. States patient woke up this AM and was confused and didn't know where she was or what was going on around her. She called everyone "[name]." She also appeared very anxious and restless. Patient lives with daughter and states pt is normally only able to wash her face but is dependent on her daughter for her ADLs. She suffered a stroke previously and has experienced difficulty with short term memory for several months now. They have an aide who is with her from 10am-5pm. Also have a hospital bed and ambulates with a walker normally. Denies any pain except for abdominal pain when questioned. Daughter reports patient has been taking all medications and denies any sick contacts. She has no h/o seizures but does report sometimes pt is has daily starring episodes. She is incontinent and has not had a BM for about 4 days. Has h/o UTI. Daughter does endorse pt sometimes "spits up" and had an episode of vomiting on Saturday. Pt has been eating and drinking normally although she has lost some weight. Denies current tobacco, alcohol, or recreational drug use. Daughter does report pt c/o hurting between her thighs.   In the ED, cbg was found to be 33 and  lactic acid 10.89. Vital signs were stable and not febrile. Had a mild leukocytosis of 11.6. Also with AKI and SCr of 6.06 (baseline unknown) with SCr of 1.5 during last admission in May 2016. She was fluid resuscitated in the ED given 2.5L of NS and an amp of D50. CXR showed mild central pulmonary vascular congestion and interstitial edema. No PNA. CT head with atrophy and a degree of demyelination. No acute hemorrhage or infarct. KUB showed large stool burden without other acute findings. UA with few squames and many bacteria.   Hospital Course by problem list: Principal Problem:   Acute encephalopathy Active Problems:   HYPERTENSION, BENIGN   CAD, NATIVE VESSEL   Obesity hypoventilation syndrome (HCC)   AKI (acute kidney injury) ([place])   Hyperthyroidism   Type 2 diabetes mellitus (HCC)   Lactic acidosis   History of CVA (cerebrovascular accident)   Chronic systolic HF (heart failure) (HCC)   Cognitive impairment   Altered mental status   Acute encephalopathy and Sepsis 2/2 UTI Patient presented with AMS since the morning of admission. As per daughter, patient was doing okay previously. CBG in ED found to be 33 and was given an amp of D50. CT of head showing atrophy with widespread abnormal attenuation in the supratentorial white matter as well as in the lateral left cerebellum and mid pontine region, more severe on the right than on the left. Prior infarct in the anterior left parietal lobe. A degree of superimposed demyelination cannot be excluded The overall appearance is essentially stable compared to the prior studies without demonstrable acute infarct. No acute hemorrhage or mass. Previous MRI (2010) showed R pontine subacute infarct with remote lacunar infarcts. Also showed extensive periventricular and subcortical white matter changes which could possibly be seen with chronic microvascular disease or a demyelinating process but favored to represent ischemic. EEG was normal.  However, sepsis and worsening mentation likely in the setting of UTI because urine culture growing >100,000 cfu of Klebsiella pneumoniae. Patient was treated with IV Aztreonam. Urine culture also growing pseudomonas fluorescens but lab could not provide sensitivity (possible contaminant?). Blood culture x 2 showing no growth in 3 days. Renal US showing a 2.9 cm simple cyst on the lower pole of the L kidney.  No hydronephrosis seen. No leukocytosis. Patient is currently AAOx2, following verbal commands, is much more alert and talking more. Her speech is comprehensible. She denies having any complaints. Denies having any CP, SOB, or abdominal pain. Denies having pain anywhere else. Patient does have baseline decreased mental status. Patient is stable and will be discharged to inpatient rehab. IV Aztreonam has been dicontinued and patient transitioned to oral Cipro 500 mg BID x 7 days.   Lactic acidosis: Lactic acid 10.89 on arrival. CBG found to be 33 on arrival. Lactic acidosis likely d/t hypoglycemia, AKI, with metformin possibly contributing. She was given 2.5L NS in the ED. Lactic acidosis resolved the following day.   AKI on CKD: Likely from ATN in the setting of sepsis/ hypotension and continued use of mobic, enalapril, and lasix. Scr improved to 4.01 today from peak 6.06. Patient's BL SCr is unknown but SCr was 1.5 during admission in May 2016. We held NSAIDs, ACEi/ARB, and lasix.   Hyperthyroidism: TSH low, free T4 high. Continued PTU.  HTN: continued Metoprolol  DM: A1c 6.3. We held Metformin due to recent AKI and lactic acidosis.   Hx of CVA - Continued Plavix 75 mg daily   HLD: continue Lipitor 40 mg daily and Niacin '1000mg'$  daily   GERD: continued Pepcid 10 mg BID  Discharge Vitals:   BP 169/94 mmHg  Pulse 104  Temp(Src) 99.3 F (37.4 C) (Oral)  Resp 18  Ht '5\' 7"'$  (1.702 m)  Wt 225 lb 8.5 oz (102.3 kg)  BMI 35.31 kg/m2  SpO2 94%  Discharge Labs:  Results for orders placed or  performed during the hospital encounter of 08/02/15 (from the past 24 hour(s))  Glucose, capillary     Status: Abnormal   Collection Time: 08/05/15  4:51 PM  Result Value Ref Range   Glucose-Capillary 115 (H) 65 - 99 mg/dL  Glucose, capillary     Status: Abnormal   Collection Time: 08/05/15  8:33 PM  Result Value Ref Range   Glucose-Capillary 124 (H) 65 - 99 mg/dL  Glucose, capillary     Status: Abnormal   Collection Time: 08/06/15 12:03 AM  Result Value Ref Range   Glucose-Capillary 126 (H) 65 - 99 mg/dL  Glucose, capillary     Status: Abnormal   Collection Time: 08/06/15  4:11 AM  Result Value Ref Range   Glucose-Capillary 132 (H) 65 - 99 mg/dL  CBC     Status: Abnormal   Collection Time: 08/06/15  6:00 AM  Result Value Ref Range   WBC 8.2 4.0 - 10.5 K/uL   RBC 3.89 3.87 - 5.11 MIL/uL   Hemoglobin 9.7 (L) 12.0 - 15.0 g/dL   HCT 30.3 (L) 36.0 - 46.0 %   MCV 77.9 (L) 78.0 - 100.0 fL   MCH 24.9 (L) 26.0 - 34.0 pg   MCHC 32.0 30.0 - 36.0 g/dL   RDW 19.1 (H) 11.5 - 15.5 %   Platelets 198 150 - 400 K/uL  Basic metabolic panel     Status: Abnormal   Collection Time: 08/06/15  6:00 AM  Result Value Ref Range   Sodium 145 135 - 145 mmol/L   Potassium 3.7 3.5 - 5.1 mmol/L   Chloride 112 (H) 101 - 111 mmol/L   CO2 23 22 - 32 mmol/L   Glucose, Bld 130 (H) 65 - 99 mg/dL   BUN 24 (H) 6 - 20 mg/dL   Creatinine, Ser 4.01 (H) 0.44 - 1.00 mg/dL   Calcium 8.8 (L) 8.9 -  10.3 mg/dL   GFR calc non Af Amer 11 (L) >60 mL/min   GFR calc Af Amer 13 (L) >60 mL/min   Anion gap 10 5 - 15  Glucose, capillary     Status: Abnormal   Collection Time: 08/06/15  7:45 AM  Result Value Ref Range   Glucose-Capillary 124 (H) 65 - 99 mg/dL  Glucose, capillary     Status: Abnormal   Collection Time: 08/06/15 11:59 AM  Result Value Ref Range   Glucose-Capillary 237 (H) 65 - 99 mg/dL    Signed: Shela Leff, MD 08/06/2015, 1:57 PM    Services Ordered on Discharge: Equipment Ordered on  Discharge:

## 2015-08-06 NOTE — H&P (View-Only) (Signed)
Physical Medicine and Rehabilitation Admission H&P    Chief Complaint  Patient presents with  . Altered Mental Status  : HPI: Angelica Beck is a 64 y.o. right handed female with history of hypertension, CVA with short-term memory deficits, diabetes mellitus peripheral neuropathy, hyperlipidemia, obesity hypoventilation syndrome, chronic renal insufficiency with creatinine 1.56 in May 2016. Patient lives with her daughter and has a home health aide 10 AM to 5 PM daily. Used a rolling walker mainly for pivot transfers prior to admission otherwise used a scooter. Aide assist with bathing and dressing and tub transfers. Over the past few weeks patient and needed assistance with feeding. Presents of 08/02/2015 with nonproductive cough, hypotension, altered mental status with anxiety and restlessness. Noted admission creatinine 6.06 from baseline 1.56 in May 2016. Cranial CT scan showed atrophy widespread abnormal attenuation in the supratentorial white matter as well as lateral left cerebellum and mid pontine region more severe on the right than left. Prior infarct in the anterior left parietal lobe. No acute changes. Chest x-ray negative pneumonia. Abdominal films no acute findings. Renal ultrasound unremarkable. EEG showed no seizure activity. Renal insufficiency felt to be likely ATN secondary to hypotension and possible sepsis with latest creatinine 4.01. Urine culture Klebsiella as well as Pseudomonas and currently maintained on Azactam. Blood cultures negative. Maintain on Plavix for CVA prophylaxis as well as subcutaneous heparin for DVT prophylaxis. Current workup is ongoing. Physical therapy evaluation completed 08/03/2015 with the recommendations of physical medicine rehabilitation consult. Patient was admitted for comprehensive rehabilitation program  Review of Systems  Constitutional: Positive for fever. Negative for chills.  HENT: Negative for congestion.   Eyes: Negative for blurred  vision and double vision.  Respiratory: Negative for cough and sputum production.   Cardiovascular: Negative for chest pain and palpitations.  Gastrointestinal: Negative for heartburn, nausea, vomiting, abdominal pain and diarrhea.  Genitourinary: Negative for dysuria.  Musculoskeletal: Positive for myalgias.  Skin: Negative for rash.  Neurological: Positive for focal weakness. Negative for dizziness, tingling and headaches.  Endo/Heme/Allergies: Does not bruise/bleed easily.  Psychiatric/Behavioral: Negative for depression. The patient is nervous/anxious.    Past Medical History  Diagnosis Date  . Hypertension   . Stroke ([place])   . Arthritis   . Diabetes mellitus, type 2 ([place])   . Hyperlipidemia   . Hyperthyroidism   . Obesity hypoventilation syndrome ([place]) 06/20/2013   Past Surgical History  Procedure Laterality Date  . Ventral hernia repair     Family History  Problem Relation Age of Onset  . Diabetes Mother    Social History:  reports that she quit smoking about 12 years ago. Her smoking use included Cigarettes. She has a 7.5 pack-year smoking history. She has never used smokeless tobacco. She reports that she does not drink alcohol or use illicit drugs. Allergies:  Allergies  Allergen Reactions  . Penicillins Swelling    Facial swelling   Medications Prior to Admission  Medication Sig Dispense Refill  . aspirin EC 81 MG tablet Take 1 tablet (81 mg total) by mouth daily. 30 tablet 0  . baclofen (LIORESAL) 10 MG tablet Take 10 mg by mouth 3 (three) times daily as needed. Muscle spasms    . Cholecalciferol (VITAMIN D) 2000 UNITS tablet Take 2,000 Units by mouth daily.    . clopidogrel (PLAVIX) 75 MG tablet Take 75 mg by mouth daily.     . diphenhydramine-acetaminophen (TYLENOL PM) 25-500 MG TABS Take 1 tablet by mouth at bedtime as needed (sleep).    Marland Kitchen  enalapril (VASOTEC) 20 MG tablet Take 20 mg by mouth daily.     . furosemide (LASIX) 40 MG tablet Take 40 mg by mouth  every morning.    . gabapentin (NEURONTIN) 100 MG capsule Take 100 mg by mouth at bedtime.     . insulin aspart (NOVOLOG) 100 UNIT/ML FlexPen Inject 15 Units into the skin as needed for high blood sugar (per sliding scale).     . insulin glargine (LANTUS) 100 unit/mL SOPN Inject 23 Units into the skin at bedtime.    . insulin lispro (HUMALOG) 100 UNIT/ML KiwkPen Inject 15 Units into the skin 3 (three) times daily as needed (CBG 200 or more).    . meloxicam (MOBIC) 15 MG tablet Take 15 mg by mouth daily.    . metFORMIN (GLUCOPHAGE) 1000 MG tablet Take 1,000 mg by mouth 2 (two) times daily.    . metoprolol succinate (TOPROL-XL) 50 MG 24 hr tablet Take 50 mg by mouth daily.     . niacin (NIASPAN) 1000 MG CR tablet Take 1,000 mg by mouth at bedtime.     . nitroGLYCERIN (NITROSTAT) 0.4 MG SL tablet Place 1 tablet (0.4 mg total) under the tongue every 5 (five) minutes as needed for chest pain. 30 tablet 0  . oxybutynin (DITROPAN-XL) 10 MG 24 hr tablet Take 10 mg by mouth daily.     Marland Kitchen propylthiouracil (PTU) 50 MG tablet Take 50 mg by mouth 2 (two) times daily.     . ranitidine (ZANTAC) 150 MG tablet Take 150 mg by mouth 2 (two) times daily.     . rosuvastatin (CRESTOR) 40 MG tablet Take 40 mg by mouth at bedtime.    . traMADol (ULTRAM) 50 MG tablet Take 50 mg by mouth every 6 (six) hours as needed (pain).     . traZODone (DESYREL) 50 MG tablet Take 50 mg by mouth at bedtime as needed for sleep.     Marland Kitchen albuterol (PROVENTIL HFA;VENTOLIN HFA) 108 (90 BASE) MCG/ACT inhaler Inhale 2 puffs into the lungs every 6 (six) hours as needed for wheezing or shortness of breath.      Home: Home Living Family/patient expects to be discharged to:: Inpatient rehab Living Arrangements: Children Available Help at Discharge: Family, Personal care attendant, Available PRN/intermittently Type of Home: House Home Access: Level entry Home Layout: Two level, Able to live on main level with bedroom/bathroom Alternate Level  Stairs-Number of Steps: 1 flight Alternate Level Stairs-Rails: Left Home Equipment: Walker - 2 wheels, Tub bench, Electric scooter, Bedside commode Additional Comments: [name], pt's daughter, provided pt's information.  [name] providees pericare to pt in morning before going to work around [place] care attendant comes at [place] and stays til 5pm.  In the hours in between pt remains in bed where she either sleeps or watches TV.     Functional History: Prior Function Level of Independence: Needs assistance Gait / Transfers Assistance Needed: RW for mainly pivot transfers only.  Otherwise uses scooter. ADL's / Homemaking Assistance Needed: Aide assists with bathing and dressing and tub transfer.  Over the past few weeks pt has needed assist w/ feeding.  Functional Status:  Mobility: Bed Mobility Overal bed mobility: Needs Assistance Bed Mobility: Rolling, Supine to Sit, Sit to Supine Rolling: Min assist Supine to sit: Mod assist Sit to supine: Mod assist General bed mobility comments: Rolling to right/left to assist with pericare. Mod A to elevate trunk and to bring BLEs into bed to return to supine. increased time/effort.  heavy use of rails. Transfers Overall transfer level: Needs assistance Equipment used: Rolling walker (2 wheeled) Transfers: Sit to/from Stand, [employer] Transfers Sit to Stand: Mod assist Stand pivot transfers: Min assist General transfer comment: Mod A to boost from EOB with cues for hand placement/technique. Bil knee buckling x2 resulting on falling back onto bed. SPT bed to chair with Min A for balance/safety.       ADL:    Cognition: Cognition Overall Cognitive Status: No family/caregiver present to determine baseline cognitive functioning Orientation Level: Oriented to person, Oriented to place, Oriented to situation, Disoriented to time Cognition Arousal/Alertness: Awake/alert Behavior During Therapy: WFL for tasks assessed/performed Overall Cognitive  Status: No family/caregiver present to determine baseline cognitive functioning Area of Impairment: Orientation, Memory, Safety/judgement Orientation Level: Disoriented to, Time, Situation Memory: Decreased short-term memory (this is pt's baseline) Safety/Judgement: Decreased awareness of safety, Decreased awareness of deficits General Comments: Pt is very lethargic this session, had just returned from x-ray.  Keeps eyes closed throughout most of session.  Knows where she is and her name but not why she is here or the date.  Physical Exam: Blood pressure 169/94, pulse 104, temperature 99.3 F (37.4 C), temperature source Oral, resp. rate 18, height '5\' 7"'  (1.702 m), weight 102.3 kg (225 lb 8.5 oz), SpO2 94 %. Physical Exam  Morbidly obese, NAD HENT: oral mucosa pink/moist Head: Normocephalic.  Eyes:  Pupils sluggish to light  Neck: Normal range of motion. Neck supple. No thyromegaly present.  Cardiovascular: Normal rate and regular rhythm.  Respiratory:  Decreased breath sounds at the bases but otherwise clear to auscultation GI: Soft. Bowel sounds are normal. She exhibits no distension.  Neurological:  Patient is alert, was talking on phone to daughter when I arrived. Speech remains dysarthric. Left central 7 and mild tongue deviation 2 + to 3- strength in the right deltoid biceps triceps 3   at the grip, 2 minus at the hip flexor, 2 knee extensor and ankle dorsal flexor bilaterally. 3- minus in the left deltoid, biceps, triceps, 3+ grip. LLE: 2hf, 3ke and adf/apf.  Senses pain in all 4. LUE and LLE 3+, no resting tone Psych: pt generally cooperative. Slightly anxious.   Results for orders placed or performed during the hospital encounter of 08/02/15 (from the past 48 hour(s))  Glucose, capillary     Status: Abnormal   Collection Time: 08/04/15  5:39 PM  Result Value Ref Range   Glucose-Capillary 121 (H) 65 - 99 mg/dL  Glucose, capillary     Status: Abnormal   Collection Time:  08/04/15  8:18 PM  Result Value Ref Range   Glucose-Capillary 146 (H) 65 - 99 mg/dL  Glucose, capillary     Status: Abnormal   Collection Time: 08/04/15 11:53 PM  Result Value Ref Range   Glucose-Capillary 137 (H) 65 - 99 mg/dL  Glucose, capillary     Status: Abnormal   Collection Time: 08/05/15  4:27 AM  Result Value Ref Range   Glucose-Capillary 120 (H) 65 - 99 mg/dL  Glucose, capillary     Status: Abnormal   Collection Time: 08/05/15  8:00 AM  Result Value Ref Range   Glucose-Capillary 106 (H) 65 - 99 mg/dL  CBC     Status: Abnormal   Collection Time: 08/05/15  8:20 AM  Result Value Ref Range   WBC 7.9 4.0 - 10.5 K/uL   RBC 4.04 3.87 - 5.11 MIL/uL   Hemoglobin 9.8 (L) 12.0 - 15.0 g/dL   HCT  31.2 (L) 36.0 - 46.0 %   MCV 77.2 (L) 78.0 - 100.0 fL   MCH 24.3 (L) 26.0 - 34.0 pg   MCHC 31.4 30.0 - 36.0 g/dL   RDW 19.0 (H) 11.5 - 15.5 %   Platelets 199 150 - 400 K/uL  Basic metabolic panel     Status: Abnormal   Collection Time: 08/05/15  8:20 AM  Result Value Ref Range   Sodium 143 135 - 145 mmol/L   Potassium 3.5 3.5 - 5.1 mmol/L   Chloride 112 (H) 101 - 111 mmol/L   CO2 21 (L) 22 - 32 mmol/L   Glucose, Bld 118 (H) 65 - 99 mg/dL   BUN 26 (H) 6 - 20 mg/dL   Creatinine, Ser 4.70 (H) 0.44 - 1.00 mg/dL   Calcium 8.8 (L) 8.9 - 10.3 mg/dL   GFR calc non Af Amer 9 (L) >60 mL/min   GFR calc Af Amer 11 (L) >60 mL/min    Comment: (NOTE) The eGFR has been calculated using the CKD EPI equation. This calculation has not been validated in all clinical situations. eGFR's persistently <60 mL/min signify possible Chronic Kidney Disease.    Anion gap 10 5 - 15  Glucose, capillary     Status: Abnormal   Collection Time: 08/05/15 12:12 PM  Result Value Ref Range   Glucose-Capillary 120 (H) 65 - 99 mg/dL  Glucose, capillary     Status: Abnormal   Collection Time: 08/05/15  4:51 PM  Result Value Ref Range   Glucose-Capillary 115 (H) 65 - 99 mg/dL  Glucose, capillary     Status:  Abnormal   Collection Time: 08/05/15  8:33 PM  Result Value Ref Range   Glucose-Capillary 124 (H) 65 - 99 mg/dL  Glucose, capillary     Status: Abnormal   Collection Time: 08/06/15 12:03 AM  Result Value Ref Range   Glucose-Capillary 126 (H) 65 - 99 mg/dL  Glucose, capillary     Status: Abnormal   Collection Time: 08/06/15  4:11 AM  Result Value Ref Range   Glucose-Capillary 132 (H) 65 - 99 mg/dL  CBC     Status: Abnormal   Collection Time: 08/06/15  6:00 AM  Result Value Ref Range   WBC 8.2 4.0 - 10.5 K/uL   RBC 3.89 3.87 - 5.11 MIL/uL   Hemoglobin 9.7 (L) 12.0 - 15.0 g/dL   HCT 30.3 (L) 36.0 - 46.0 %   MCV 77.9 (L) 78.0 - 100.0 fL   MCH 24.9 (L) 26.0 - 34.0 pg   MCHC 32.0 30.0 - 36.0 g/dL   RDW 19.1 (H) 11.5 - 15.5 %   Platelets 198 150 - 400 K/uL  Basic metabolic panel     Status: Abnormal   Collection Time: 08/06/15  6:00 AM  Result Value Ref Range   Sodium 145 135 - 145 mmol/L   Potassium 3.7 3.5 - 5.1 mmol/L   Chloride 112 (H) 101 - 111 mmol/L   CO2 23 22 - 32 mmol/L   Glucose, Bld 130 (H) 65 - 99 mg/dL   BUN 24 (H) 6 - 20 mg/dL   Creatinine, Ser 4.01 (H) 0.44 - 1.00 mg/dL   Calcium 8.8 (L) 8.9 - 10.3 mg/dL   GFR calc non Af Amer 11 (L) >60 mL/min   GFR calc Af Amer 13 (L) >60 mL/min    Comment: (NOTE) The eGFR has been calculated using the CKD EPI equation. This calculation has not been validated in all clinical situations.  eGFR's persistently <60 mL/min signify possible Chronic Kidney Disease.    Anion gap 10 5 - 15  Glucose, capillary     Status: Abnormal   Collection Time: 08/06/15  7:45 AM  Result Value Ref Range   Glucose-Capillary 124 (H) 65 - 99 mg/dL  Glucose, capillary     Status: Abnormal   Collection Time: 08/06/15 11:59 AM  Result Value Ref Range   Glucose-Capillary 237 (H) 65 - 99 mg/dL   Dg Swallowing Func-speech Pathology  08/05/2015  Objective Swallowing Evaluation:   Patient Details Name: Angelica Beck MRN: [id number] Date of Birth:  08/06/1951 Today's Date: 08/05/2015 Time: SLP Start Time (ACUTE ONLY): 1015-SLP Stop Time (ACUTE ONLY): 1045 SLP Time Calculation (min) (ACUTE ONLY): 30 min Past Medical History: Past Medical History Diagnosis Date . Hypertension  . Stroke ([place])  . Arthritis  . Diabetes mellitus, type 2 ([place])  . Hyperlipidemia  . Hyperthyroidism  . Obesity hypoventilation syndrome ([place]) 06/20/2013 Past Surgical History: Past Surgical History Procedure Laterality Date . Ventral hernia repair   HPI: Charda Janis is a 64 y.o. female who has a PMH of HTN; CVA (LEFT parietal) ([place]); Arthritis; DM, type 2 ([place]); Hyperlipidemia; Hyperthyroidism; and Obesity hypoventilation syndrome ([place]), presented to [facility] 08/02/15 with AMS. Baseline dependent on daughter for ADLs, previous CVA resulted in short term memory deficits, daughter reports pt sometimes "spits up", episode of vomiting on Saturday. Currently being treated for possible UTI, acute kidney injury, CT indicated no acute infarcts, CXR mild central pulmonary vascular congestion and interstitial edema accentuated by borderline hypo inflation. There is no alveolar pneumonia nor pleural effusion. Not previously seen by SLP, currently NPO. No Data Recorded Assessment / Plan / Recommendation CHL IP CLINICAL IMPRESSIONS 08/05/2015 Therapy Diagnosis Severe oral phase dysphagia;Mild pharyngeal phase dysphagia Clinical Impression Pt demonstrated severe oral dysphagia and mild pharyngeal dysphagia due to AMS resulting in reduced awareness and coordination of bolus. Pt's oral phase is prolonged: pt holds bolus causing premature spillage of all consistencies and piecemeals thin and nectar thick liquids. Pt has a delayed swallow initiation with all consistencies resulting in unsensed penetration of boluses. Pt does not follow max verbal, tactile, and visual cues for second swallow to clear residue, but residue slightly improves with presentation of dry spoon to initiate second swallow, but is not  consistent to clear all residue. Penetrate is not consistently cleared with throat clear/ cough as pt does not follow cues to clear throat/cough consistently. SLP suspects pt's swallow function will improve as AMS improves, thin liquids are the safest when pt is presented small boluses. SLP recommends pt remain NPO, will f/u with further po trials to assess diet readiness.    CHL IP TREATMENT RECOMMENDATION 08/05/2015 Treatment Recommendations Therapy as outlined in treatment plan below   CHL IP DIET RECOMMENDATION 08/05/2015 SLP Diet Recommendations NPO Liquid Administration via -- Medication Administration Via alternative means Compensations -- Postural Changes --   No flowsheet data found.  CHL IP FOLLOW UP RECOMMENDATIONS 08/05/2015 Follow up Recommendations Skilled Nursing facility   [facility] IP FREQUENCY AND DURATION 08/05/2015 Speech Therapy Frequency (ACUTE ONLY) min 2x/week Treatment Duration 2 weeks      CHL IP ORAL PHASE 08/05/2015 Oral Phase Impaired Oral - Pudding Teaspoon -- Oral - Pudding Cup -- Oral - Honey Teaspoon -- Oral - Honey Cup -- Oral - Nectar Teaspoon -- Oral - Nectar Cup Left anterior bolus loss;Right anterior bolus loss;Holding of bolus;Piecemeal swallowing;Delayed oral transit Oral - Nectar Straw Left anterior bolus loss;Right anterior  bolus loss;Holding of bolus;Piecemeal swallowing;Delayed oral transit Oral - Thin Teaspoon -- Oral - Thin Cup Left anterior bolus loss;Right anterior bolus loss;Holding of bolus;Piecemeal swallowing;Delayed oral transit;Lingual/palatal residue Oral - Thin Straw -- Oral - Puree Left anterior bolus loss;Right anterior bolus loss;Holding of bolus;Piecemeal swallowing;Delayed oral transit Oral - Mech Soft -- Oral - Regular -- Oral - Multi-Consistency -- Oral - Pill -- Oral Phase - Comment --  CHL IP PHARYNGEAL PHASE 08/05/2015 Pharyngeal Phase Thin;Nectar;Solids Pharyngeal- Pudding Teaspoon -- Pharyngeal -- Pharyngeal- Pudding Cup -- Pharyngeal -- Pharyngeal- Honey  Teaspoon -- Pharyngeal -- Pharyngeal- Honey Cup -- Pharyngeal -- Pharyngeal- Nectar Teaspoon NT Pharyngeal -- Pharyngeal- Nectar Cup Delayed swallow initiation-vallecula;Pharyngeal residue - pyriform;Pharyngeal residue - valleculae;Penetration/Aspiration during swallow Pharyngeal Material enters airway, remains ABOVE vocal cords and not ejected out Pharyngeal- Nectar Straw Delayed swallow initiation-vallecula;Pharyngeal residue - valleculae;Pharyngeal residue - pyriform;Penetration/Aspiration during swallow Pharyngeal Material enters airway, remains ABOVE vocal cords and not ejected out Pharyngeal- Thin Teaspoon NT Pharyngeal -- Pharyngeal- Thin Cup Delayed swallow initiation-pyriform sinuses;Pharyngeal residue - pyriform Pharyngeal -- Pharyngeal- Thin Straw Delayed swallow initiation-pyriform sinuses;Pharyngeal residue - pyriform;Pharyngeal residue - valleculae Pharyngeal -- Pharyngeal- Puree Delayed swallow initiation-vallecula;Pharyngeal residue - valleculae;Pharyngeal residue - pyriform Pharyngeal -- Pharyngeal- Mechanical Soft NT Pharyngeal -- Pharyngeal- Regular NT Pharyngeal -- Pharyngeal- Multi-consistency NT Pharyngeal -- Pharyngeal- Pill NT Pharyngeal -- Pharyngeal Comment --  CHL IP CERVICAL ESOPHAGEAL PHASE 08/05/2015 Cervical Esophageal Phase WFL Pudding Teaspoon -- Pudding Cup -- Honey Teaspoon -- Honey Cup -- Nectar Teaspoon -- Nectar Cup -- Nectar Straw -- Thin Teaspoon -- Thin Cup -- Thin Straw -- Puree -- Mechanical Soft -- Regular -- Multi-consistency -- Pill -- Cervical Esophageal Comment -- Lanier Ensign, SLP Student Supervised and reviewed by Herbie Baltimore MA CCC-SLP [place], Katherene Ponto 08/05/2015, 2:41 PM                  Medical Problem List and Plan: 1. Functional deficits secondary to acute encephalopathy due to urosepsis with prior history of CVA (right pons, basal ganglia infarcts) 2.  DVT Prophylaxis/Anticoagulation: Subcutaneous heparin indicated. Monitor platelet counts  and any signs of bleeding 3. Pain Management: Tylenol as needed 4. Dysphagia. Dysphagia #2 thin liquids. Continued daily clinicaly reassessment   -advance per SLP  -encourage adequate po 5. Neuropsych: This patient is capable of making decisions on her own behalf. 6. Skin/Wound Care: Routine skin checks 7. Fluids/Electrolytes/Nutrition: Routine I&O with follow-up chemistries on Monday 8. Acute on chronic renal insufficiency. Latest creatinine 4.01 felt to be secondary to ATN and sepsis.  -all labs reviewed with Cr trending down over last 3 days  -encourage po fluids 9. Hypertension. bp's have been poorly controlled.  -utilize prn clonidine for bp> 200/100 10. Hyperlipidemia. Lipitor 11. Diabetes mellitus and peripheral neuropathy. Hemoglobin A1c 6.3. Liberalize diet. 12. Klebsiella/pseudomonas Urosepsis:  Continue Azactam  Post Admission Physician Evaluation: 1. Functional deficits secondary  to acute encephalopathy with subsequent deconditioning in the setting of prior CVA's. 2. Patient is admitted to receive collaborative, interdisciplinary care between the physiatrist, rehab nursing staff, and therapy team. 3. Patient's level of medical complexity and substantial therapy needs in context of that medical necessity cannot be provided at a lesser intensity of care such as a SNF. 4. Patient has experienced substantial functional loss from his/her baseline which was documented above under the "Functional History" and "Functional Status" headings.  Judging by the patient's diagnosis, physical exam, and functional history, the patient has potential for functional progress which will result in measurable gains  while on inpatient rehab.  These gains will be of substantial and practical use upon discharge  in facilitating mobility and self-care at the household level. 5. Physiatrist will provide 24 hour management of medical needs as well as oversight of the therapy plan/treatment and provide guidance  as appropriate regarding the interaction of the two. 6. 24 hour rehab nursing will assist with bladder management, bowel management, safety, skin/wound care, disease management, medication administration, pain management and patient education  and help integrate therapy concepts, techniques,education, etc. 7. PT will assess and treat for/with: Lower extremity strength, range of motion, stamina, balance, functional mobility, safety, adaptive techniques and equipment, NMR, cognitive perceptual awareness, family education, ego support.   Goals are: min to mod assist. 8. OT will assess and treat for/with: ADL's, functional mobility, safety, upper extremity strength, adaptive techniques and equipment, NMR, cognitive perceptual awareness, family ed, community reintegration.   Goals are: min to mod assist. Therapy may proceed with showering this patient. 9. SLP will assess and treat for/with: cognition, communication, family ed.  Goals are: supervision. 10. Case Management and Social Worker will assess and treat for psychological issues and discharge planning. 11. Team conference will be held weekly to assess progress toward goals and to determine barriers to discharge. 12. Patient will receive at least 3 hours of therapy per day at least 5 days per week. 13. ELOS: 11-14 days       14. Prognosis:  excellent     Meredith Staggers, MD, [place] [facility] 08/06/2015   08/06/2015

## 2015-08-06 NOTE — Progress Notes (Signed)
Rehab admissions - Patient speaking to me on rounds this am with encouragement, speech slurred.  Awaiting PT to see patient today prior to deciding on inpatient rehab admission.  I spoke with daughter on the phone.  Daughter feels like patient has had another CVA with increased drooling and the slurred speech.  Patient is not back to her baseline per daughter.  I will talk to rehab MD for potential inpatient rehab admission.  Call me for questions.  #[phone]

## 2015-08-06 NOTE — Clinical Social Work Note (Signed)
Patient will be admitted into inpatient today.  CSW to sign off, please reconsult if other social work needs arise.  Jones Broom. [place], MSW, [place] 08/06/2015 1:15 PM

## 2015-08-06 NOTE — Progress Notes (Signed)
Speech Language Pathology Treatment: Dysphagia  Patient Details Name: Angelica Beck MRN: [id number] DOB: 1950/10/01 Today's Date: 08/06/2015 Time: 7425-9563 SLP Time Calculation (min) (ACUTE ONLY): 15 min  Assessment / Plan / Recommendation Clinical Impression  Pt demonstrates significant improvement in arousal and awareness today. She is conversant and oriented, able to reposition and fed herself. With trials of thin liquids pts swallow is now timely with no holding or evident delay. Pt is managing her secretions and there is no wet vocal quality after PO trials of thin or puree. The pts hyolaryngeal elevation is subjectively stronger on palpation. Suspect dysphagia has resolved with mentation, recommend a dys 2 (fine chop) diet with thin liquids given pts dentures are not present. Also recommend pills whole in puree to reduce risk. Will f/u for tolerance and upgrade if pts family brings dentures.    HPI HPI: Angelica Beck is a 64 y.o. female who has a PMH of HTN; CVA (LEFT parietal) ([place]); Arthritis; DM, type 2 ([place]); Hyperlipidemia; Hyperthyroidism; and Obesity hypoventilation syndrome ([place]), presented to [facility] 08/02/15 with AMS. Baseline dependent on daughter for ADLs, previous CVA resulted in short term memory deficits, daughter reports pt sometimes "spits up", episode of vomiting on Saturday. Currently being treated for possible UTI, acute kidney injury, CT indicated no acute infarcts, CXR mild central pulmonary vascular congestion and interstitial edema accentuated by borderline hypo inflation. There is no alveolar pneumonia nor pleural effusion. Not previously seen by SLP, currently NPO.      SLP Plan  Continue with current plan of care     Recommendations  Diet recommendations: Dysphagia 2 (fine chop);Thin liquid Liquids provided via: Straw;Cup Medication Administration: Whole meds with puree Supervision: Patient able to self feed              Plan: Continue with current plan of  care  [facility], [place] CCC-SLP [phone]  Lynann Beaver 08/06/2015, 8:52 AM

## 2015-08-06 NOTE — Progress Notes (Signed)
Report given to Dolores Lory, Therapist, sports. Pt transported to 4W-13. Pt in stable condition.   Ruben Reason, RN

## 2015-08-06 NOTE — Progress Notes (Signed)
Patient seen and examined. Case d/w residents in detail. I agree with findings and plan as documented in Dr. Elon Jester note.  Patient much improved today. Now able to be started on dysphagia diet. Will d/c aztreonam and start PO cipro to complete 7 day course of abx for UTI. Blood cx remain negative.   AKI improving - likely ATN secondary to sepsis. Will need repeat BMP in 3-4 days. Continue to hold ACE-I/lasix/NSAIDs.  Stable to d/c to inpatient rehab today

## 2015-08-06 NOTE — Progress Notes (Signed)
Rehab admissions - Rehab MD has seen patient and has agreed to inpatient rehab admission.  Bed available and will admit to inpatient rehab today.  Call me for questions.  [phone]

## 2015-08-06 NOTE — Progress Notes (Signed)
Patient information reviewed and entered into eRehab system by Jmichael Gille, RN, CRRN, PPS Coordinator.  Information including medical coding and functional independence measure will be reviewed and updated through discharge.     Per nursing patient was given "Data Collection Information Summary for Patients in Inpatient Rehabilitation Facilities with attached "Privacy Act Statement-Health Care Records" upon admission.  

## 2015-08-06 NOTE — Interval H&P Note (Signed)
Angelica Beck was admitted today to Inpatient Rehabilitation with the diagnosis of encephalopathy.  The patient's history has been reviewed, patient examined, and there is no change in status.  Patient continues to be appropriate for intensive inpatient rehabilitation.  I have reviewed the patient's chart and labs.  Questions were answered to the patient's satisfaction. The PAPE has been reviewed and assessment remains appropriate.  Kayela Humphres T 08/06/2015, 5:08 PM

## 2015-08-06 NOTE — Progress Notes (Signed)
Retta Diones, RN Rehab Admission Coordinator Signed Physical Medicine and Rehabilitation PMR Pre-admission 08/06/2015 12:19 PM  Related encounter: ED to Hosp-Admission (Current) from 08/02/2015 in [place] Collapse All   PMR Admission Coordinator Pre-Admission Assessment  Patient: Angelica Beck is an 64 y.o., female MRN: [id number] DOB: 06/20/1951 Height: '5\' 7"'$  (170.2 cm) Weight: 102.3 kg (225 lb 8.5 oz)  Insurance Information HMO: No PPO: PCP: IPA: 80/20: OTHER:  PRIMARY: Medicare A/B Policy#: [id number] Subscriber: [name] CM Name: Phone#: Fax#:  Pre-Cert#: Employer: Disabled Benefits: Phone #: Name: Checked in [place]. Date: 01/24/96 Deduct: $1288 Out of Pocket Max: None Life Max: unlimited CIR: 100% SNF: 100 days Outpatient: 80% Co-Pay: 20% Home Health: 100 % Co-Pay: none DME: 80% Co-Pay: 20% Providers: patient's choice  SECONDARY: Medicaid [place] access Policy#: [id number] Subscriber: [name] CM Name: Phone#: Fax#:  Pre-Cert#: Employer: Disabled Benefits: Phone #: Name:  Eff. Date: Deduct: Out of Pocket Max: Life Max:  CIR: SNF:  Outpatient: Co-Pay:  Home Health: Co-Pay:  DME: Co-Pay:   Emergency Contact Information Contact Information    Name Relation Home Work Mobile   Tisdale,[name] Daughter [phone]       Current Medical History  Patient Admitting Diagnosis: Encephalopathy/sepsis, debility  History of Present Illness: A 64 y.o. right handed female with history of hypertension, CVA with short-term memory deficits, diabetes mellitus  peripheral neuropathy, hyperlipidemia, obesity hypoventilation syndrome, chronic renal insufficiency with creatinine 1.56 in May 2016. Patient lives with her daughter and has a home health aide 10 AM to 5 PM daily. Used a rolling walker mainly for pivot transfers prior to admission otherwise used a scooter. Aide assist with bathing and dressing and tub transfers. Over the past few weeks patient has needed assistance with feeding. Presents of 08/02/2015 with nonproductive cough, hypotension, altered mental status with anxiety and restlessness. Noted admission creatinine 6.06 from baseline 1.56 in May 2016. Cranial CT scan showed atrophy widespread abnormal attenuation in the supratentorial white matter as well as lateral left cerebellum and mid pontine region more severe on the right than left. Prior infarct in the anterior left parietal lobe. No acute changes. Chest x-ray negative pneumonia. Abdominal films no acute findings. Renal ultrasound unremarkable. EEG showed no seizure activity. Renal insufficiency felt to be likely ATN secondary to hypotension and possible sepsis with latest creatinine 4.01. Urine culture Klebsiella as well as Pseudomonas and currently maintained on Azactam. Blood cultures negative. Maintain on Plavix for CVA prophylaxis as well as subcutaneous heparin for DVT prophylaxis. Current workup is ongoing. Physical therapy evaluation completed 08/03/2015 with the recommendations of physical medicine rehabilitation consult. Patient to be admitted for comprehensive inpatient rehabilitation program.   Past Medical History  Past Medical History  Diagnosis Date  . Hypertension   . Stroke ([place])   . Arthritis   . Diabetes mellitus, type 2 ([place])   . Hyperlipidemia   . Hyperthyroidism   . Obesity hypoventilation syndrome ([place]) 06/20/2013    Family History  family history includes Diabetes in her mother.  Prior Rehab/Hospitalizations: Had HHPT 4-5 months ago after  kidney illness with [facility] (daughter thinks).  Has the patient had major surgery during 100 days prior to admission? No  Current Medications   Current facility-administered medications:  . acetaminophen (TYLENOL) tablet 650 mg, 650 mg, Oral, Q6H PRN **OR** acetaminophen (TYLENOL) suppository 650 mg, 650 mg, Rectal, Q6H PRN, Jones Bales, MD . antiseptic oral rinse (CPC / CETYLPYRIDINIUM CHLORIDE 0.05%)  solution 7 mL, 7 mL, Mouth Rinse, q12n4p, Nischal Narendra, MD, 7 mL at 08/06/15 1116 . atorvastatin (LIPITOR) tablet 40 mg, 40 mg, Oral, q1800, Kris Mouton, RPH, 40 mg at 08/03/15 1800 . aztreonam (AZACTAM) 500 mg in dextrose 5 % 50 mL IVPB, 500 mg, Intravenous, 3 times per day, Cassie Jodean Lima, RPH, Last Rate: 100 mL/hr at 08/06/15 1115, 500 mg at 08/06/15 1115 . bisacodyl (DULCOLAX) suppository 10 mg, 10 mg, Rectal, Daily, Jones Bales, MD, 10 mg at 08/06/15 0934 . chlorhexidine (PERIDEX) 0.12 % solution 15 mL, 15 mL, Mouth Rinse, BID, Nischal Narendra, MD, 15 mL at 08/06/15 0934 . clopidogrel (PLAVIX) tablet 75 mg, 75 mg, Oral, Daily, Jones Bales, MD, 75 mg at 08/06/15 0933 . dextrose 5 %-0.45 % sodium chloride infusion, , Intravenous, Continuous, Shela Leff, MD, Last Rate: 50 mL/hr at 08/06/15 0938 . dextrose 50 % solution 50 mL, 1 ampule, Intravenous, Once PRN, Jones Bales, MD . famotidine (PEPCID) tablet 10 mg, 10 mg, Oral, BID, Jones Bales, MD, 10 mg at 08/06/15 0933 . heparin injection 5,000 Units, 5,000 Units, Subcutaneous, 3 times per day, Jones Bales, MD, 5,000 Units at 08/06/15 0535 . metoprolol (LOPRESSOR) injection 5 mg, 5 mg, Intravenous, 4 times per day, Tasrif Ahmed, MD, 5 mg at 08/06/15 1114 . multivitamin with minerals tablet 1 tablet, 1 tablet, Oral, Daily, Jones Bales, MD, 1 tablet at 08/06/15 0934 . niacin (NIASPAN) CR tablet 1,000 mg, 1,000 mg, Oral, QHS, Jones Bales, MD, 1,000 mg at 08/02/15 2228 . nitroGLYCERIN  (NITROSTAT) SL tablet 0.4 mg, 0.4 mg, Sublingual, Q5 min PRN, Jones Bales, MD . promethazine (PHENERGAN) injection 6.25 mg, 6.25 mg, Intravenous, Q8H PRN, Shela Leff, MD, 6.25 mg at 08/03/15 1800 . propylthiouracil (PTU) tablet 50 mg, 50 mg, Oral, BID, Jones Bales, MD, 50 mg at 08/06/15 0934 . senna-docusate (Senokot-S) tablet 1 tablet, 1 tablet, Oral, QHS PRN, Jones Bales, MD  Patients Current Diet: DIET DYS 2 Room service appropriate?: Yes; Fluid consistency:: Thin  Precautions / Restrictions Precautions Precautions: Fall Restrictions Weight Bearing Restrictions: No   Has the patient had 2 or more falls or a fall with injury in the past year?NoPatient had 1 fall in the past year 4-5 months ago related to kidney illness.  Prior Activity Level Household: Homebound. Goes out about 2 X a month  Development worker, international aid / [place] Devices/Equipment: Bedside commode/3-in-1, Environmental consultant (specify type), Hospital bed Home Equipment: Walker - 2 wheels, Tub bench, Electric scooter, Bedside commode  Prior Device Use: Indicate devices/aids used by the patient prior to current illness, exacerbation or injury? Motorized wheelchair or scooter and Radio producer Level Prior Function Level of Independence: Needs assistance Gait / Transfers Assistance Needed: RW for mainly pivot transfers only. Otherwise uses scooter. ADL's / Homemaking Assistance Needed: Aide assists with bathing and dressing and tub transfer. Over the past few weeks pt has needed assist w/ feeding.  Self Care: Did the patient need help bathing, dressing, using the toilet or eating? Needed some help  Indoor Mobility: Did the patient need assistance with walking from room to room (with or without device)? Needed some help  Stairs: Did the patient need assistance with internal or external stairs (with or without device)? Dependent  Functional Cognition: Did the patient need help  planning regular tasks such as shopping or remembering to take medications? Dependent  Current Functional Level Cognition  Overall Cognitive Status: No family/caregiver present  to determine baseline cognitive functioning Orientation Level: Oriented to person, Oriented to place, Oriented to situation, Disoriented to time Safety/Judgement: Decreased awareness of safety, Decreased awareness of deficits General Comments: Pt is very lethargic this session, had just returned from x-ray. Keeps eyes closed throughout most of session. Knows where she is and her name but not why she is here or the date.   Extremity Assessment (includes Sensation/Coordination)  Upper Extremity Assessment: Generalized weakness, RUE deficits/detail, LUE deficits/detail RUE Deficits / Details: chorea present, not pt's baseline RUE Coordination: decreased gross motor LUE Deficits / Details: chorea present, not pt's baseline LUE Coordination: decreased gross motor  Lower Extremity Assessment: Generalized weakness, RLE deficits/detail, LLE deficits/detail RLE Coordination: decreased gross motor LLE Coordination: decreased gross motor    ADLs       Mobility  Overal bed mobility: Needs Assistance Bed Mobility: Rolling, Supine to Sit, Sit to Supine Rolling: Min assist Supine to sit: Mod assist Sit to supine: Mod assist General bed mobility comments: Rolling to right/left to assist with pericare. Mod A to elevate trunk and to bring BLEs into bed to return to supine. increased time/effort. heavy use of rails.    Transfers  Overall transfer level: Needs assistance Equipment used: Rolling walker (2 wheeled) Transfers: Sit to/from Stand, [employer] Transfers Sit to Stand: Mod assist Stand pivot transfers: Min assist General transfer comment: Mod A to boost from EOB with cues for hand placement/technique. Bil knee buckling x2 resulting on falling back onto bed. SPT bed to chair with Min A for  balance/safety.     Ambulation / Gait / Stairs / Office manager / Balance Dynamic Sitting Balance Sitting balance - Comments: Mod>close min guard assist sitting EOB. Pt sits for ~5 mins but has difficulty keeping her eyes open and fatigues quickly. RN made aware of pt's lethargic state Balance Overall balance assessment: Needs assistance Sitting-balance support: Feet supported, Bilateral upper extremity supported Sitting balance-Leahy Scale: Fair Sitting balance - Comments: Mod>close min guard assist sitting EOB. Pt sits for ~5 mins but has difficulty keeping her eyes open and fatigues quickly. RN made aware of pt's lethargic state Standing balance support: During functional activity Standing balance-Leahy Scale: Poor Standing balance comment: Relient on RW and external support (Min A)    Special needs/care consideration BiPAP/CPAP No CPM No5 Continuous Drip IV D5 and 0.45 % NS at 50 ml/hr  Dialysis No  Life Vest No Oxygen Yes, uses 02 2L [place] at night at home Special Bed No Trach Size No Wound Vac (area) No  Skin Breakdown on inner thighs and on patient's bottom per daughter  Bowel mgmt: Last BM 08/06/15 with incontinence Bladder mgmt: Voids with incontinence, wears depends at home Diabetic mgmt Yes, on oral medications and insulin at home.   Previous Home Environment Living Arrangements: Children Available Help at Discharge: Family, Personal care attendant, Available PRN/intermittently Type of Home: House Home Layout: Two level, Able to live on main level with bedroom/bathroom Alternate Level Stairs-Rails: Left Alternate Level Stairs-Number of Steps: 1 flight Home Access: Level entry [place]: Yes Type of Home Care Services: Homehealth aide Additional Comments: [name], pt's daughter, provided pt's information. [name] providees pericare to pt in morning before going to work around [place] care  attendant comes at [place] and stays til 5pm. In the hours in between pt remains in bed where she either sleeps or watches TV.   Discharge Living Setting Plans for Discharge Living Setting: House, Lives  with (comment) (Lives with her daughter.) Type of Home at Discharge: House Discharge Home Layout: Two level, 1/2 bath on main level, Bed/bath upstairs, Other (Comment) (Has a hospital bed on main level and BSC for patient.) Alternate Level Stairs-Number of Steps: Flight Discharge Home Access: Stairs to enter [employer] of Steps: 1 step at garage entry and 2 steps at front entry Does the patient have any problems obtaining your medications?: No  Social/Family/Support Systems Patient Roles: Parent (Has a daughter.) Has grandchildren ages 57, 6 and 30. Contact Information: [name] - daughter [phone] Anticipated Caregiver: Aide daily and daughter after work and nights Ability/Limitations of Caregiver: Daughter works 7 am to 3 pm. Aide comes 10 am to 5 pm daily Caregiver Availability: Other (Comment) (May be alone 7 am to 10 am daily.) Discharge Plan Discussed with Primary Caregiver: Yes Is Caregiver In Agreement with Plan?: Yes Does Caregiver/Family have Issues with Lodging/Transportation while Pt is in Rehab?: No  Goals/Additional Needs Patient/Family Goal for Rehab:PT/OT/ST min/mod assist goals. Expected length of stay: 14 days Cultural Considerations: None Dietary Needs: Dys 2, thin liquids Equipment Needs: TBD Special Service Needs: Patient has an aide 10 am to 5 pm. Pt/Family Agrees to Admission and willing to participate: Yes Program Orientation Provided & Reviewed with Pt/Caregiver Including Roles & Responsibilities: Yes  Decrease burden of Care through IP rehab admission: N/A  Possible need for SNF placement upon discharge: Not planned  Patient Condition: This patient's medical and functional status has changed since the consult dated: 08/04/15 in which  the Rehabilitation Physician determined and documented that the patient's condition is appropriate for intensive rehabilitative care in an inpatient rehabilitation facility. See "History of Present Illness" (above) for medical update. Functional changes are: Currently requiring min/mod assist for stand pivot transfers. Patient's medical and functional status update has been discussed with the Rehabilitation physician and patient remains appropriate for inpatient rehabilitation. Will admit to inpatient rehab today.  Preadmission Screen Completed By: Retta Diones, 08/06/2015 12:42 PM ______________________________________________________________________  Discussed status with Dr. Naaman Plummer on 08/06/15 at 1241 and received telephone approval for admission today.  Admission Coordinator: Retta Diones time1241/Date11/11/16          Cosigned by: Meredith Staggers, MD at 08/06/2015 12:54 PM  Revision History     Date/Time User Provider Type Action   08/06/2015 12:54 PM Meredith Staggers, MD Physician Cosign   08/06/2015 12:43 PM Retta Diones, RN Rehab Admission Coordinator Sign

## 2015-08-06 NOTE — Progress Notes (Signed)
Physical Therapy Treatment Patient Details Name: Angelica Beck MRN: [id number] DOB: 04/01/1951 Today's Date: 08/06/2015    History of Present Illness 64year-old female brought in from home for altered mental status noted by daughter.  CT showed changes likely 2/2 small vascular disease, no acute infarct.  PMH inlcudes stroke, arthritis, DM II, obesity hypoventilation syndrome.        PT Comments    Patient alert and awake today. Tolerated standing activities and transfer to chair with Min-Mod A for balance/safety. Weakness noted in BLEs upon standing resulting in LOB onto bed x2. Incontinent of urine. Tolerated there ex. Increased time to perform all mobility. Will continue to follow acutely to maximize independence and mobility.   Follow Up Recommendations  CIR;Supervision/Assistance - 24 hour     Equipment Recommendations  Other (comment) (TBD)    Recommendations for Other Services       Precautions / Restrictions Precautions Precautions: Fall Restrictions Weight Bearing Restrictions: No    Mobility  Bed Mobility Overal bed mobility: Needs Assistance Bed Mobility: Rolling;Supine to Sit;Sit to Supine Rolling: Min assist   Supine to sit: Mod assist Sit to supine: Mod assist   General bed mobility comments: Rolling to right/left to assist with pericare. Mod A to elevate trunk and to bring BLEs into bed to return to supine. increased time/effort. heavy use of rails.  Transfers Overall transfer level: Needs assistance Equipment used: Rolling walker (2 wheeled) Transfers: Sit to/from [facility] Sit to Stand: Mod assist Stand pivot transfers: Min assist       General transfer comment: Mod A to boost from EOB with cues for hand placement/technique. Bil knee buckling x2 resulting on falling back onto bed. SPT bed to chair with Min A for balance/safety.   Ambulation/Gait                 Stairs            Wheelchair Mobility     Modified Rankin (Stroke Patients Only)       Balance Overall balance assessment: Needs assistance Sitting-balance support: Feet supported;Bilateral upper extremity supported Sitting balance-Leahy Scale: Fair     Standing balance support: During functional activity Standing balance-Leahy Scale: Poor Standing balance comment: Relient on RW and external support (Min A)                    Cognition Arousal/Alertness: Awake/alert Behavior During Therapy: WFL for tasks assessed/performed Overall Cognitive Status: No family/caregiver present to determine baseline cognitive functioning                      Exercises General Exercises - Lower Extremity Ankle Circles/Pumps: Both;10 reps;Supine Long Arc Quad: Both;10 reps;Seated    General Comments        Pertinent Vitals/Pain Pain Assessment: No/denies pain Faces Pain Scale: No hurt    Home Living                      Prior Function            PT Goals (current goals can now be found in the care plan section) Progress towards PT goals: Progressing toward goals    Frequency  Min 3X/week    PT Plan Current plan remains appropriate    Co-evaluation             End of Session Equipment Utilized During Treatment: Gait belt Activity Tolerance: Patient tolerated treatment well;Patient limited by fatigue Patient left:  in chair;with call bell/phone within reach;with chair alarm set     Time: [phone] PT Time Calculation (min) (ACUTE ONLY): 25 min  Charges:  $Therapeutic Activity: 23-37 mins                    G Codes:      Cephas Revard A Taralynn Quiett 08/06/2015, 12:18 PM  Wray Kearns, [place], DPT [phone]

## 2015-08-06 NOTE — Progress Notes (Signed)
MBSS complete. Full report located under chart review in imaging section. Kiaria Quinnell, MA CCC-SLP [phone]  

## 2015-08-07 ENCOUNTER — Inpatient Hospital Stay (HOSPITAL_COMMUNITY): Payer: Medicare Other

## 2015-08-07 LAB — CULTURE, BLOOD (ROUTINE X 2)
CULTURE: NO GROWTH
Culture: NO GROWTH

## 2015-08-07 LAB — GLUCOSE, CAPILLARY
GLUCOSE-CAPILLARY: 125 mg/dL — AB (ref 65–99)
GLUCOSE-CAPILLARY: 147 mg/dL — AB (ref 65–99)
Glucose-Capillary: 118 mg/dL — ABNORMAL HIGH (ref 65–99)
Glucose-Capillary: 206 mg/dL — ABNORMAL HIGH (ref 65–99)
Glucose-Capillary: 151 mg/dL — ABNORMAL HIGH (ref 65–99)

## 2015-08-07 NOTE — Evaluation (Signed)
Speech Language Pathology Assessment and Plan  Patient Details  Name: Angelica Beck MRN: [id number] Date of Birth: 06/13/1951  SLP Diagnosis: Dysarthria;Cognitive Impairments;Dysphagia  Rehab Potential: Good ELOS: 14-16 days    Today's Date: 08/07/2015 SLP Individual Time: 8366-2947 SLP Individual Time Calculation (min): 54 min   Problem List:  Patient Active Problem List   Diagnosis Date Noted  . Bacterial UTI 08/06/2015  . Sepsis ([place]) 08/06/2015  . Altered mental status   . Lactic acidosis 08/02/2015  . Acute encephalopathy 08/02/2015  . History of CVA (cerebrovascular accident) 08/02/2015  . Chronic systolic HF (heart failure) ([place]) 08/02/2015  . Cognitive impairment 08/02/2015  . AKI (acute kidney injury) ([place]) 02/08/2015  . Abnormality of gait 02/08/2015  . Hyperthyroidism 02/08/2015  . Type 2 diabetes mellitus ([place]) 02/08/2015  . Chest pain 01/19/2014  . Obesity hypoventilation syndrome ([place]) 06/20/2013  . HYPERTHYROIDISM 06/28/2009  . DIAB W/UNS COMP TYPE II/UNS NOT STATED UNCNTRL 06/28/2009  . HYPERLIPIDEMIA-MIXED 06/28/2009  . OVERWEIGHT/OBESITY 06/28/2009  . HYPERTENSION, BENIGN 06/28/2009  . CAD, NATIVE VESSEL 06/28/2009  . DYSPNEA 06/28/2009   Past Medical History:  Past Medical History  Diagnosis Date  . Hypertension   . Stroke ([place])   . Arthritis   . Diabetes mellitus, type 2 ([place])   . Hyperlipidemia   . Hyperthyroidism   . Obesity hypoventilation syndrome ([place]) 06/20/2013   Past Surgical History:  Past Surgical History  Procedure Laterality Date  . Ventral hernia repair      Assessment / Plan / Recommendation Clinical Impression Angelica Beck is a 64 y.o. right handed female with history of hypertension, CVA with short-term memory deficits, diabetes mellitus peripheral neuropathy, hyperlipidemia, obesity hypoventilation syndrome, chronic renal insufficiency with creatinine 1.56 in May 2016. Patient lives with her daughter and has a home  health aide 10 AM to 5 PM daily. Used a rolling walker mainly for pivot transfers prior to admission otherwise used a scooter. Aide assist with bathing and dressing and tub transfers. Over the past few weeks patient and needed assistance with feeding. Presents of 08/02/2015 with nonproductive cough, hypotension, altered mental status with anxiety and restlessness. Noted admission creatinine 6.06 from baseline 1.56 in May 2016. Cranial CT scan showed atrophy widespread abnormal attenuation in the supratentorial white matter as well as lateral left cerebellum and mid pontine region more severe on the right than left. Prior infarct in the anterior left parietal lobe. No acute changes. Chest x-ray negative pneumonia. Abdominal films no acute findings. Renal ultrasound unremarkable. EEG showed no seizure activity. Renal insufficiency felt to be likely ATN secondary to hypotension and possible sepsis with latest creatinine 4.01. Urine culture Klebsiella as well as Pseudomonas and currently maintained on Azactam. Blood cultures negative. Maintain on Plavix for CVA prophylaxis as well as subcutaneous heparin for DVT prophylaxis. Patient transferred to CIR on 08/06/2015.   Pt currently requires Mod assist to utilize memory compensatory strategies, and needs Mod cueing to increase her intelligibility of speech. Per her daughter who cares for her at home, she notices persistent cognitive and communicative changes. Pt is currently on a Dys 2 diet and thin liquids, but consumes mechanical soft consistencies at home. Recommend SLP f/u to maximize communication, cognition, and swallowing function to increase overall safety.    Skilled Therapeutic Interventions          Cognitive-linguistic evaluation completed with results and recommendations reviewed with family.     SLP Assessment  Patient will need skilled Speech Lanaguage Pathology Services during CIR admission  Recommendations  SLP Diet Recommendations: Dysphagia  2 (Fine chop);Thin Liquid Administration via: Straw Medication Administration: Whole meds with liquid Supervision: Patient able to self feed;Intermittent supervision to cue for compensatory strategies Compensations: Minimize environmental distractions;Slow rate;Small sips/bites Postural Changes and/or Swallow Maneuvers: Seated upright 90 degrees Oral Care Recommendations: Oral care BID Patient destination: Home Follow up Recommendations: 24 hour supervision/assistance;Home Health SLP Equipment Recommended: None recommended by SLP    SLP Frequency 3 to 5 out of 7 days   SLP Treatment/Interventions Cognitive remediation/compensation;Cueing hierarchy;Dysphagia/aspiration precaution training;Environmental controls;Functional tasks;Internal/external aids;Patient/family education;Speech/Language facilitation   Pain Pain Assessment Pain Assessment: No/denies pain Prior Functioning Cognitive/Linguistic Baseline: Baseline deficits Baseline deficit details: pt with significant deficits s/p prior CVA, including poor memory - daughter says she assists with all ADLs and cognitive tasks Type of Home: House  Lives With: Family Available Help at Discharge: Family  Function:  Eating Eating Eating activity did not occur: N/A Modified Consistency Diet: Yes Eating Assist Level: Supervision or verbal cues           Cognition Comprehension Comprehension assist level: Understands basic 50 - 74% of the time/ requires cueing 25 - 49% of the time  Expression   Expression assist level: Expresses basic 75 - 89% of the time/requires cueing 10 - 24% of the time. Needs helper to occlude trach/needs to repeat words.  Social Interaction Social Interaction assist level: Interacts appropriately 75 - 89% of the time - Needs redirection for appropriate language or to initiate interaction.  Problem Solving Problem solving assist level: Solves basic 50 - 74% of the time/requires cueing 25 - 49% of the time  Memory  Memory assist level: Recognizes or recalls 25 - 49% of the time/requires cueing 50 - 75% of the time   Short Term Goals: Week 1: SLP Short Term Goal 1 (Week 1): Pt will demonstrate adequate oral manipulation of Dys 3 textures across two consecutive sessions to demonstrate readiness for diet advancement SLP Short Term Goal 2 (Week 1): Pt will utilize speech intelligibility strategies within structured tasks at the sentence level with Min cues SLP Short Term Goal 3 (Week 1): Pt will utilize external memory aids to facilitate recall of new and daily information with Min cues  Refer to Care Plan for Long Term Goals  Recommendations for other services: None  Discharge Criteria: Patient will be discharged from SLP if patient refuses treatment 3 consecutive times without medical reason, if treatment goals not met, if there is a change in medical status, if patient makes no progress towards goals or if patient is discharged from hospital.  The above assessment, treatment plan, treatment alternatives and goals were discussed and mutually agreed upon: by patient   Germain Osgood, M.A. CCC-SLP [phone]  Germain Osgood 08/07/2015, 3:40 PM

## 2015-08-07 NOTE — Progress Notes (Signed)
Occupational Therapy Assessment and Plan  Patient Details  Name: Angelica Beck MRN: [id number] Date of Birth: 16-May-1951  OT Diagnosis: abnormal posture, altered mental status, cognitive deficits and muscle weakness (generalized) Rehab Potential: Rehab Potential (ACUTE ONLY): Good ELOS: 14-16 days   Today's Date: 08/07/2015 OT Individual Time: 0800-0900 OT Individual Time Calculation (min): 60 min     Problem List:  Patient Active Problem List   Diagnosis Date Noted  . Bacterial UTI 08/06/2015  . Sepsis ([place]) 08/06/2015  . Altered mental status   . Lactic acidosis 08/02/2015  . Acute encephalopathy 08/02/2015  . History of CVA (cerebrovascular accident) 08/02/2015  . Chronic systolic HF (heart failure) ([place]) 08/02/2015  . Cognitive impairment 08/02/2015  . AKI (acute kidney injury) ([place]) 02/08/2015  . Abnormality of gait 02/08/2015  . Hyperthyroidism 02/08/2015  . Type 2 diabetes mellitus ([place]) 02/08/2015  . Chest pain 01/19/2014  . Obesity hypoventilation syndrome ([place]) 06/20/2013  . HYPERTHYROIDISM 06/28/2009  . DIAB W/UNS COMP TYPE II/UNS NOT STATED UNCNTRL 06/28/2009  . HYPERLIPIDEMIA-MIXED 06/28/2009  . OVERWEIGHT/OBESITY 06/28/2009  . HYPERTENSION, BENIGN 06/28/2009  . CAD, NATIVE VESSEL 06/28/2009  . DYSPNEA 06/28/2009    Past Medical History:  Past Medical History  Diagnosis Date  . Hypertension   . Stroke ([place])   . Arthritis   . Diabetes mellitus, type 2 ([place])   . Hyperlipidemia   . Hyperthyroidism   . Obesity hypoventilation syndrome ([place]) 06/20/2013   Past Surgical History:  Past Surgical History  Procedure Laterality Date  . Ventral hernia repair      Assessment & Plan Clinical Impression: Angelica Beck is a 64 y.o. right handed female with history of hypertension, CVA with short-term memory deficits, diabetes mellitus peripheral neuropathy, hyperlipidemia, obesity hypoventilation syndrome, chronic renal insufficiency with creatinine 1.56 in  May 2016. Patient lives with her daughter and has a home health aide 10 AM to 5 PM daily. Used a rolling walker mainly for pivot transfers prior to admission otherwise used a scooter. Aide assist with bathing and dressing and tub transfers. Over the past few weeks patient and needed assistance with feeding. Presents of 08/02/2015 with nonproductive cough, hypotension, altered mental status with anxiety and restlessness. Noted admission creatinine 6.06 from baseline 1.56 in May 2016. Cranial CT scan showed atrophy widespread abnormal attenuation in the supratentorial white matter as well as lateral left cerebellum and mid pontine region more severe on the right than left. Prior infarct in the anterior left parietal lobe. No acute changes. Chest x-ray negative pneumonia. Abdominal films no acute findings. Renal ultrasound unremarkable. EEG showed no seizure activity. Renal insufficiency felt to be likely ATN secondary to hypotension and possible sepsis with latest creatinine 4.01. Urine culture Klebsiella as well as Pseudomonas and currently maintained on Azactam. Blood cultures negative. Maintain on Plavix for CVA prophylaxis as well as subcutaneous heparin for DVT prophylaxis. Patient transferred to CIR on 08/06/2015 .    Patient currently requires max with basic self-care skills secondary to muscle weakness, decreased cardiorespiratoy endurance, decreased coordination, decreased initiation, decreased problem solving, decreased safety awareness, decreased memory and delayed processing and decreased standing balance, decreased postural control and decreased balance strategies.  Prior to hospitalization, patient could complete BADLs with min-mod assist. Patient had home health aide to assist with self-care tasks at home.  Patient will benefit from skilled intervention to decrease level of assist with basic self-care skills prior to discharge home with care partner.  Anticipate patient will require minimal physical  assistance and follow up  home health.  OT - End of Session Activity Tolerance: Decreased this session Endurance Deficit: Yes Endurance Deficit Description: frequent rest breaks OT Assessment Rehab Potential (ACUTE ONLY): Good OT Patient demonstrates impairments in the following area(s): Balance;Cognition;Endurance;Motor;Safety;Sensory OT Basic ADL's Functional Problem(s): Grooming;Bathing;Dressing;Toileting OT Transfers Functional Problem(s): Toilet OT Additional Impairment(s): Other (comment) (generalized weakness) OT Plan OT Intensity: Minimum of 1-2 x/day, 45 to 90 minutes OT Frequency: 5 out of 7 days OT Duration/Estimated Length of Stay: 14-16 days OT Treatment/Interventions: Balance/vestibular training;Cognitive remediation/compensation;Discharge planning;Neuromuscular re-education;Functional mobility training;DME/adaptive equipment instruction;Patient/family education;Self Care/advanced ADL retraining;Splinting/orthotics;Visual/perceptual remediation/compensation;UE/LE Coordination activities;UE/LE Strength taining/ROM;Therapeutic Exercise;Therapeutic Activities OT Self Feeding Anticipated Outcome(s): n/a OT Basic Self-Care Anticipated Outcome(s): min-mod assist OT Toileting Anticipated Outcome(s): mod assist  OT Bathroom Transfers Anticipated Outcome(s): supervision OT Recommendation Patient destination: Home Follow Up Recommendations: Home health OT Equipment Recommended: None recommended by OT   Skilled Therapeutic Intervention OT eval completed. Discussed role of OT, goals of therapy, possible ELOS, discharge plans, and fall risk. Pt received supine in bed incontinent of bowel. Completed rolling L<>R with min A as therapist completed hygiene and donning of new brief. Completed stand pivot transfer bed>w/c with mod A and increased time. Pt demonstrates heavy reliance to pull up on bed rails for transfers. Completed sit<>stand at sink 2x with overall mod A and pulling up. Pt  returned to bed at end of session and left with all needs in reach.   OT Evaluation Precautions/Restrictions  Precautions Precautions: Fall Restrictions Weight Bearing Restrictions: No General   Vital Signs Therapy Vitals Pulse Rate: 79 BP: (!) 167/92 mmHg Patient Position (if appropriate): Sitting Oxygen Therapy SpO2: 98 % O2 Device: Not Delivered Pain Pain Assessment Pain Assessment: No/denies pain Home Living/Prior Functioning Home Living Available Help at Discharge: Family, Personal care attendant, Available PRN/intermittently Type of Home: House Home Access: Level entry Home Layout: Two level, Able to live on main level with bedroom/bathroom Alternate Level Stairs-Number of Steps: 1 flight Alternate Level Stairs-Rails: Left Bathroom Shower/Tub: Tub/shower unit Additional Comments: Personal care attendant 10-5  Lives With: Family Prior Function Level of Independence: Needs assistance with ADLs, Needs assistance with tranfers Bath: Moderate Toileting: Moderate Dressing: Moderate Leisure: Hobbies-yes (Comment) Comments: watching tv ADL   Vision/Perception  Vision- History Baseline Vision/History: No visual deficits Patient Visual Report: No change from baseline Vision- Assessment Vision Assessment?: Yes Eye Alignment: Within Functional Limits Ocular Range of Motion: Within Functional Limits Alignment/Gaze Preference: Within Defined Limits Tracking/Visual Pursuits: Able to track stimulus in all quads without difficulty Saccades: Additional eye shifts occurred during testing Convergence: Within functional limits Visual Fields: No apparent deficits  Cognition Overall Cognitive Status: History of cognitive impairments - at baseline Arousal/Alertness: Awake/alert Orientation Level: Person;Place;Situation Person: Oriented Place: Oriented Situation: Oriented Year: Other (Comment) (pt reported unsure) Month: October Day of Week: Incorrect Memory:  Impaired Memory Impairment: Decreased short term memory;Decreased recall of new information;Storage deficit Immediate Memory Recall: Sock;Blue;Bed Memory Recall: Sock;Blue;Bed Memory Recall Sock: Without Cue Memory Recall Blue: With Cue Memory Recall Bed: With Cue Attention: Sustained Sustained Attention: Appears intact Awareness: Appears intact Problem Solving: Impaired Problem Solving Impairment: Functional basic Safety/Judgment: Impaired Sensation Sensation Light Touch: Appears Intact Proprioception: Impaired Detail Proprioception Impaired Details: Impaired LLE Additional Comments: knee and hallux Coordination Gross Motor Movements are Fluid and Coordinated: No Fine Motor Movements are Fluid and Coordinated: Yes Coordination and Movement Description: limited due to weakness Heel Shin Test:  bil LEs wiht reduced speed, accuracy and speed; L>R Motor  Motor Motor: Within Functional Limits Motor -  Skilled Clinical Observations: generalized weakness Mobility  Bed Mobility Bed Mobility: Rolling Right;Rolling Left Rolling Right: 4: Min assist Rolling Right Details: Verbal cues for sequencing;Manual facilitation for weight shifting Rolling Left: 4: Min assist Rolling Left Details: Verbal cues for technique;Tactile cues for weight shifting;Verbal cues for sequencing;Manual facilitation for weight shifting Transfers Transfers: Sit to Stand;Stand to Sit Sit to Stand: 3: Mod assist Sit to Stand Details: Tactile cues for weight shifting;Manual facilitation for weight shifting;Verbal cues for sequencing;Verbal cues for technique Stand to Sit: 3: Mod assist Stand to Sit Details (indicate cue type and reason): Verbal cues for sequencing;Manual facilitation for weight shifting;Tactile cues for weight shifting;Verbal cues for technique  Trunk/Postural Assessment  Cervical Assessment Cervical Assessment: Within Functional Limits Thoracic Assessment Thoracic Assessment: Exceptions to  [facility] Lumbar Assessment Lumbar Assessment: Exceptions to [facility]  Balance Balance Balance Assessed: Yes Static Sitting Balance Static Sitting - Level of Assistance: 5: Stand by assistance Dynamic Sitting Balance Dynamic Sitting - Level of Assistance: 5: Stand by assistance Sitting balance - Comments: reaching in all dirctions iwth either hand Static Standing Balance Static Standing - Level of Assistance: 4: Min assist Dynamic Standing Balance Dynamic Standing - Balance Support: Bilateral upper extremity supported Dynamic Standing - Level of Assistance: 4: Min assist;3: Mod assist Extremity/Trunk Assessment RUE Assessment RUE Assessment: Within Functional Limits (4/5 strength) LUE Assessment LUE Assessment: Within Functional Limits (4/5 strength)   See Function Navigator for Current Functional Status.   Refer to Care Plan for Long Term Goals  Recommendations for other services: None  Discharge Criteria: Patient will be discharged from OT if patient refuses treatment 3 consecutive times without medical reason, if treatment goals not met, if there is a change in medical status, if patient makes no progress towards goals or if patient is discharged from hospital.  The above assessment, treatment plan, treatment alternatives and goals were discussed and mutually agreed upon: by patient  Duayne Cal 08/07/2015, 12:55 PM

## 2015-08-07 NOTE — Progress Notes (Signed)
CBG 206 at 1700. No insulin coverage.

## 2015-08-07 NOTE — Progress Notes (Signed)
64 y.o. right handed female with history of hypertension, CVA with short-term memory deficits, diabetes mellitus peripheral neuropathy, hyperlipidemia, obesity hypoventilation syndrome, chronic renal insufficiency with creatinine 1.56 in May 2016. Patient lives with her daughter and has a home health aide 10 AM to 5 PM daily. Used a rolling walker mainly for pivot transfers prior to admission otherwise used a scooter. Aide assist with bathing and dressing and tub transfers. Over the past few weeks patient and needed assistance with feeding. Presents of 08/02/2015 with nonproductive cough, hypotension, altered mental status with anxiety and restlessness. Noted admission creatinine 6.06 from baseline 1.56 in May 2016. Cranial CT scan showed atrophy widespread abnormal attenuation in the supratentorial white matter as well as lateral left cerebellum and mid pontine region more severe on the right than left. Prior infarct in the anterior left parietal lobe. No acute changes. Chest x-ray negative pneumonia. Abdominal films no acute findings. Renal ultrasound unremarkable.  Subjective/Complaints: Had BP spike but clonidine prn wasn't written until last noc Pt more alert vs 11/9 ROS:  Had stomach pain relieved by BM yest, no SOB, no pain Objective: Vital Signs: Blood pressure 139/84, pulse 74, temperature 98.4 F (36.9 C), temperature source Oral, resp. rate 16, height _0  (1.702 m), weight 99.4 kg (219 lb 2.2 oz), SpO2 100 %. Dg Swallowing Func-speech Pathology  08/05/2015  Objective Swallowing Evaluation:   Patient Details Name: Angelica Beck MRN: [id number] Date of Birth: February 23, 1951 Today's Date: 08/05/2015 Time: SLP Start Time (ACUTE ONLY): 1015-SLP Stop Time (ACUTE ONLY): 1045 SLP Time Calculation (min) (ACUTE ONLY): 30 min Past Medical History: Past Medical History Diagnosis Date . Hypertension  . Stroke ([place])  . Arthritis  . Diabetes mellitus, type 2 ([place])  . Hyperlipidemia  . Hyperthyroidism  . Obesity  hypoventilation syndrome ([place]) 06/20/2013 Past Surgical History: Past Surgical History Procedure Laterality Date . Ventral hernia repair   HPI: Angelica Beck is a 64 y.o. female who has a PMH of HTN; CVA (LEFT parietal) ([place]); Arthritis; DM, type 2 ([place]); Hyperlipidemia; Hyperthyroidism; and Obesity hypoventilation syndrome ([place]), presented to [facility] 08/02/15 with AMS. Baseline dependent on daughter for ADLs, previous CVA resulted in short term memory deficits, daughter reports pt sometimes "spits up", episode of vomiting on Saturday. Currently being treated for possible UTI, acute kidney injury, CT indicated no acute infarcts, CXR mild central pulmonary vascular congestion and interstitial edema accentuated by borderline hypo inflation. There is no alveolar pneumonia nor pleural effusion. Not previously seen by SLP, currently NPO. No Data Recorded Assessment / Plan / Recommendation CHL IP CLINICAL IMPRESSIONS 08/05/2015 Therapy Diagnosis Severe oral phase dysphagia;Mild pharyngeal phase dysphagia Clinical Impression Pt demonstrated severe oral dysphagia and mild pharyngeal dysphagia due to AMS resulting in reduced awareness and coordination of bolus. Pt's oral phase is prolonged: pt holds bolus causing premature spillage of all consistencies and piecemeals thin and nectar thick liquids. Pt has a delayed swallow initiation with all consistencies resulting in unsensed penetration of boluses. Pt does not follow max verbal, tactile, and visual cues for second swallow to clear residue, but residue slightly improves with presentation of dry spoon to initiate second swallow, but is not consistent to clear all residue. Penetrate is not consistently cleared with throat clear/ cough as pt does not follow cues to clear throat/cough consistently. SLP suspects pt's swallow function will improve as AMS improves, thin liquids are the safest when pt is presented small boluses. SLP recommends pt remain NPO, will f/u with further po  trials to assess diet  readiness.    CHL IP TREATMENT RECOMMENDATION 08/05/2015 Treatment Recommendations Therapy as outlined in treatment plan below   CHL IP DIET RECOMMENDATION 08/05/2015 SLP Diet Recommendations NPO Liquid Administration via -- Medication Administration Via alternative means Compensations -- Postural Changes --   No flowsheet data found.  CHL IP FOLLOW UP RECOMMENDATIONS 08/05/2015 Follow up Recommendations Skilled Nursing facility   [facility] IP FREQUENCY AND DURATION 08/05/2015 Speech Therapy Frequency (ACUTE ONLY) min 2x/week Treatment Duration 2 weeks      CHL IP ORAL PHASE 08/05/2015 Oral Phase Impaired Oral - Pudding Teaspoon -- Oral - Pudding Cup -- Oral - Honey Teaspoon -- Oral - Honey Cup -- Oral - Nectar Teaspoon -- Oral - Nectar Cup Left anterior bolus loss;Right anterior bolus loss;Holding of bolus;Piecemeal swallowing;Delayed oral transit Oral - Nectar Straw Left anterior bolus loss;Right anterior bolus loss;Holding of bolus;Piecemeal swallowing;Delayed oral transit Oral - Thin Teaspoon -- Oral - Thin Cup Left anterior bolus loss;Right anterior bolus loss;Holding of bolus;Piecemeal swallowing;Delayed oral transit;Lingual/palatal residue Oral - Thin Straw -- Oral - Puree Left anterior bolus loss;Right anterior bolus loss;Holding of bolus;Piecemeal swallowing;Delayed oral transit Oral - Mech Soft -- Oral - Regular -- Oral - Multi-Consistency -- Oral - Pill -- Oral Phase - Comment --  CHL IP PHARYNGEAL PHASE 08/05/2015 Pharyngeal Phase Thin;Nectar;Solids Pharyngeal- Pudding Teaspoon -- Pharyngeal -- Pharyngeal- Pudding Cup -- Pharyngeal -- Pharyngeal- Honey Teaspoon -- Pharyngeal -- Pharyngeal- Honey Cup -- Pharyngeal -- Pharyngeal- Nectar Teaspoon NT Pharyngeal -- Pharyngeal- Nectar Cup Delayed swallow initiation-vallecula;Pharyngeal residue - pyriform;Pharyngeal residue - valleculae;Penetration/Aspiration during swallow Pharyngeal Material enters airway, remains ABOVE vocal cords and not  ejected out Pharyngeal- Nectar Straw Delayed swallow initiation-vallecula;Pharyngeal residue - valleculae;Pharyngeal residue - pyriform;Penetration/Aspiration during swallow Pharyngeal Material enters airway, remains ABOVE vocal cords and not ejected out Pharyngeal- Thin Teaspoon NT Pharyngeal -- Pharyngeal- Thin Cup Delayed swallow initiation-pyriform sinuses;Pharyngeal residue - pyriform Pharyngeal -- Pharyngeal- Thin Straw Delayed swallow initiation-pyriform sinuses;Pharyngeal residue - pyriform;Pharyngeal residue - valleculae Pharyngeal -- Pharyngeal- Puree Delayed swallow initiation-vallecula;Pharyngeal residue - valleculae;Pharyngeal residue - pyriform Pharyngeal -- Pharyngeal- Mechanical Soft NT Pharyngeal -- Pharyngeal- Regular NT Pharyngeal -- Pharyngeal- Multi-consistency NT Pharyngeal -- Pharyngeal- Pill NT Pharyngeal -- Pharyngeal Comment --  CHL IP CERVICAL ESOPHAGEAL PHASE 08/05/2015 Cervical Esophageal Phase WFL Pudding Teaspoon -- Pudding Cup -- Honey Teaspoon -- Honey Cup -- Nectar Teaspoon -- Nectar Cup -- Nectar Straw -- Thin Teaspoon -- Thin Cup -- Thin Straw -- Puree -- Mechanical Soft -- Regular -- Multi-consistency -- Pill -- Cervical Esophageal Comment -- Lanier Ensign, SLP Student Supervised and reviewed by Herbie Baltimore MA CCC-SLP [place], Katherene Ponto 08/05/2015, 2:41 PM              Results for orders placed or performed during the hospital encounter of 08/06/15 (from the past 72 hour(s))  CBC     Status: Abnormal   Collection Time: 08/06/15  7:13 PM  Result Value Ref Range   WBC 11.5 (H) 4.0 - 10.5 K/uL   RBC 4.37 3.87 - 5.11 MIL/uL   Hemoglobin 10.6 (L) 12.0 - 15.0 g/dL   HCT 33.2 (L) 36.0 - 46.0 %   MCV 76.0 (L) 78.0 - 100.0 fL   MCH 24.3 (L) 26.0 - 34.0 pg   MCHC 31.9 30.0 - 36.0 g/dL   RDW 19.1 (H) 11.5 - 15.5 %   Platelets 182 150 - 400 K/uL  Creatinine, serum     Status: Abnormal   Collection Time: 08/06/15  7:13 PM  Result Value  Ref Range   Creatinine, Ser  3.56 (H) 0.44 - 1.00 mg/dL   GFR calc non Af Amer 13 (L) >60 mL/min   GFR calc Af Amer 15 (L) >60 mL/min    Comment: (NOTE) The eGFR has been calculated using the CKD EPI equation. This calculation has not been validated in all clinical situations. eGFR's persistently <60 mL/min signify possible Chronic Kidney Disease.   Glucose, capillary     Status: Abnormal   Collection Time: 08/06/15  8:43 PM  Result Value Ref Range   Glucose-Capillary 221 (H) 65 - 99 mg/dL  Glucose, capillary     Status: Abnormal   Collection Time: 08/07/15 12:04 AM  Result Value Ref Range   Glucose-Capillary 147 (H) 65 - 99 mg/dL  Glucose, capillary     Status: Abnormal   Collection Time: 08/07/15  4:44 AM  Result Value Ref Range   Glucose-Capillary 125 (H) 65 - 99 mg/dL     HEENT: edentulous Cardio: RRR Resp: CTA B/L and unlabored GI: BS positive and NT, ND Extremity:  No Edema Skin:   Intact Neuro: Lethargic, Flat and Abnormal Motor 4- BUE and 3- BLE Musc/Skel:  Other no pain with UE or LE ROM Gen NAD   Assessment/Plan:  1. Functional deficits secondary to acute encephalopathy which require 3+ hours per day of interdisciplinary therapy in a comprehensive inpatient rehab setting. Physiatrist is providing close team supervision and 24 hour management of active medical problems listed below. Physiatrist and rehab team continue to assess barriers to discharge/monitor patient progress toward functional and medical goals. FIM:                   Function - Comprehension Comprehension: Auditory Comprehension assist level: Understands basic 50 - 74% of the time/ requires cueing 25 - 49% of the time  Function - Expression Expression: Verbal Expression assist level: Expresses basic 25 - 49% of the time/requires cueing 50 - 75% of the time. Uses single words/gestures.  Function - Social Interaction Social Interaction assist level: Interacts appropriately 75 - 89% of the time - Needs redirection  for appropriate language or to initiate interaction.  Function - Problem Solving Problem solving assist level: Solves basic 25 - 49% of the time - needs direction more than half the time to initiate, plan or complete simple activities  Function - Memory Memory assist level: Recognizes or recalls 25 - 49% of the time/requires cueing 50 - 75% of the time Patient normally able to recall (first 3 days only): That he or she is in a hospital  Medical Problem List and Plan: 1. Functional deficits secondary to acute encephalopathy due to urosepsis with prior history of CVA (right pons, basal ganglia infarcts)-initiate therapy program 2.  DVT Prophylaxis/Anticoagulation: Subcutaneous heparin indicated. Monitor platelet counts and any signs of bleeding 3. Pain Management: Tylenol as needed 4. Dysphagia. Dysphagia #2 thin liquids. Continued daily clinicaly reassessment               -advance per SLP             -encourage adequate po 5. Neuropsych: This patient is capable of making decisions on her own behalf. 6. Skin/Wound Care: Routine skin checks 7. Fluids/Electrolytes/Nutrition: Routine I&O with follow-up chemistries on Monday 8. Acute on chronic renal insufficiency. Latest creatinine 4.01 felt to be secondary to ATN and sepsis.     -all labs reviewed with Cr trending down over last 3 days             -  encourage po fluids 9. Hypertension. bp's have been poorly controlled.             -utilize prn clonidine for bp> 200/100 10. Hyperlipidemia. Lipitor 11. Diabetes mellitus and peripheral neuropathy. Hemoglobin A1c 6.3. Liberalize diet.CBG ok this am 12. Klebsiella/pseudomonas Urosepsis:  Continue Azactam  LOS (Days) 1 A FACE TO FACE EVALUATION WAS PERFORMED  KIRSTEINS,ANDREW E 08/07/2015, 7:13 AM

## 2015-08-07 NOTE — Evaluation (Addendum)
Physical Therapy Assessment and Plan  Patient Details  Name: Angelica Beck MRN: [id number] Date of Birth: 01/30/1951  PT Diagnosis: Abnormality of gait, Cognitive deficits, Dizziness and giddiness, Hemiparesis non-dominant, Impaired sensation and Muscle weakness Rehab Potential: Good ELOS: 14-16   Today's Date: 08/07/2015 PT Individual Time:1050- 1203,   -       Problem List:  Patient Active Problem List   Diagnosis Date Noted  . Bacterial UTI 08/06/2015  . Sepsis ([place]) 08/06/2015  . Altered mental status   . Lactic acidosis 08/02/2015  . Acute encephalopathy 08/02/2015  . History of CVA (cerebrovascular accident) 08/02/2015  . Chronic systolic HF (heart failure) ([place]) 08/02/2015  . Cognitive impairment 08/02/2015  . AKI (acute kidney injury) ([place]) 02/08/2015  . Abnormality of gait 02/08/2015  . Hyperthyroidism 02/08/2015  . Type 2 diabetes mellitus ([place]) 02/08/2015  . Chest pain 01/19/2014  . Obesity hypoventilation syndrome ([place]) 06/20/2013  . HYPERTHYROIDISM 06/28/2009  . DIAB W/UNS COMP TYPE II/UNS NOT STATED UNCNTRL 06/28/2009  . HYPERLIPIDEMIA-MIXED 06/28/2009  . OVERWEIGHT/OBESITY 06/28/2009  . HYPERTENSION, BENIGN 06/28/2009  . CAD, NATIVE VESSEL 06/28/2009  . DYSPNEA 06/28/2009    Past Medical History:  Past Medical History  Diagnosis Date  . Hypertension   . Stroke ([place])   . Arthritis   . Diabetes mellitus, type 2 ([place])   . Hyperlipidemia   . Hyperthyroidism   . Obesity hypoventilation syndrome ([place]) 06/20/2013   Past Surgical History:  Past Surgical History  Procedure Laterality Date  . Ventral hernia repair      Assessment & Plan Clinical Impression: Angelica Beck is a 64 y.o. right handed female with history of hypertension, CVA with short-term memory deficits, diabetes mellitus peripheral neuropathy, hyperlipidemia, obesity hypoventilation syndrome, chronic renal insufficiency with creatinine 1.56 in May 2016. Patient lives with her daughter  and has a home health aide 10 AM to 5 PM daily. Used a rolling walker mainly for pivot transfers prior to admission otherwise used a scooter. Aide assist with bathing and dressing and tub transfers. Over the past few weeks patient and needed assistance with feeding. Presents of 08/02/2015 with nonproductive cough, hypotension, altered mental status with anxiety and restlessness. Noted admission creatinine 6.06 from baseline 1.56 in May 2016. Cranial CT scan showed atrophy widespread abnormal attenuation in the supratentorial white matter as well as lateral left cerebellum and mid pontine region more severe on the right than left. Prior infarct in the anterior left parietal lobe. No acute changes. Chest x-ray negative pneumonia. Abdominal films no acute findings. Renal ultrasound unremarkable. EEG showed no seizure activity. Renal insufficiency felt to be likely ATN secondary to hypotension and possible sepsis with latest creatinine 4.01. Urine culture Klebsiella as well as Pseudomonas and currently maintained on Azactam. Blood cultures negative. Maintain on Plavix for CVA prophylaxis as well as subcutaneous heparin for DVT prophylaxis  Patient transferred to CIR on 08/06/2015 .   Patient currently requires mod with transfers, (unsafe to assess gait) secondary to muscle weakness and muscle joint tightness, decreased cardiorespiratoy endurance, impaired timing and sequencing, unbalanced muscle activation and decreased coordination and decreased awareness, decreased problem solving, decreased memory and delayed processing.  Prior to hospitalization, patient was supervision with mobility and lived with Family in a House home.  Home access is  Level entry (pt stated she prefers to access house from garage, 2 STE no rail).  Patient will benefit from skilled PT intervention to maximize safe functional mobility, minimize fall risk and decrease caregiver burden for planned discharge  home with 24 hour supervision.   Anticipate patient will benefit from follow up [place] at discharge.  PT - End of Session Activity Tolerance: Tolerates < 10 min activity, no significant change in vital signs Endurance Deficit: Yes Endurance Deficit Description: dizzy sitting EOB x 15 min PT Assessment Rehab Potential (ACUTE/IP ONLY): Good PT Patient demonstrates impairments in the following area(s): Balance;Endurance;Motor;Sensory PT Transfers Functional Problem(s): Bed Mobility;Bed to Chair;Furniture;Car PT Locomotion Functional Problem(s): Ambulation;Wheelchair Mobility;Stairs PT Plan PT Intensity: Minimum of 1-2 x/day ,45 to 90 minutes PT Frequency: 5 out of 7 days PT Duration Estimated Length of Stay: 14-16 PT Treatment/Interventions: Ambulation/gait training;Balance/vestibular training;Cognitive remediation/compensation;Discharge planning;Community reintegration;DME/adaptive equipment instruction;Functional electrical stimulation;Functional mobility training;Patient/family education;Neuromuscular re-education;Psychosocial support;Splinting/orthotics;Therapeutic Exercise;Therapeutic Activities;Stair training;UE/LE Strength taining/ROM;UE/LE Coordination activities;Wheelchair propulsion/positioning PT Transfers Anticipated Outcome(s): supervision PT Locomotion Anticipated Outcome(s): supervision w/c x 150 controlled and x 50' home; supervision gait x 50' controlled and home ; steps TBD PT Recommendation Follow Up Recommendations: Home health PT Patient destination: Home  Pt equipment recommendations: TBD  Skilled Therapeutic Intervention Pt has diarrhea; awaiting stool sample/culture for C. Diff.  Pt seen in room due to bowel incontinence.  Bed mobility for cleansing and change of brief. Pt sat EOB x 15-20 min during eval with c/o slight dizziness.  Sit> stand with c/o increased dizziness.  Pt has habit of pulling up to transfer with L hand; unable to push up to stand despite repeated attempts; "if you will let me do it my  way, I can get up".  W/c propulsion in room.  Pt reported her L knee feels quite weak and buckles often.  Gait and stairs delayed due to safety concerns at this time.  Eval completed; PT discussed ELOS with pt. Pt left resting in w/c with all needs within reach and quick release belt on.   PT Evaluation Precautions/Restrictions Precautions Precautions: Fall Restrictions Weight Bearing Restrictions: No General   Vital SignsTherapy Vitals Pulse Rate: 79 BP: (!) 167/92 mmHg Patient Position (if appropriate): Sitting Oxygen Therapy SpO2: 98 Pain Pain Assessment Pain Assessment: No/denies pain, but states she was up all night with diarrhea. Home Living/Prior Functioning  * pt unclear about entry to home; she has ramp but does not like to use it with scooter; prefers to enter from garage, 2STE but no rails.  She was unable to explain how she enters home with RW at stairs. Home Living Available Help at Discharge: Family;Personal care attendant;Available PRN/intermittently Type of Home: House Home Access: Level entry (pt stated she prefers to access house from garage, 2 STE no rail) Home Layout: Two level;Able to live on main level with bedroom/bathroom Alternate Level Stairs-Number of Steps: 1 flight Alternate Level Stairs-Rails: Left Bathroom Shower/Tub: Tub/shower unit Additional Comments: Personal care attendant 10-5  Lives With: Family Prior Function Level of Independence: Needs assistance with ADLs;Needs assistance with tranfers (pt stated she used RW indoors and scooter outdoors) Bath: Moderate Toileting: Moderate Dressing: Moderate Leisure: Hobbies-yes (Comment) Comments: watching tv Vision/Perception - no change from baseline    Cognition Overall Cognitive Status: History of cognitive impairments - at baseline Arousal/Alertness: Awake/alert Orientation Level: Oriented X4 Attention: Sustained Sustained Attention: Appears intact Sustained Attention Impairment: Functional  basic Memory: Impaired Memory Impairment: Decreased short term memory;Decreased recall of new information;Storage deficit Awareness: Appears intact Awareness Impairment: Intellectual impairment Problem Solving: Impaired Problem Solving Impairment: Functional basic Safety/Judgment: Impaired Sensation Sensation Light Touch: Appears Intact Proprioception: Impaired Detail Proprioception Impaired Details: Impaired LLE Additional Comments: knee and hallux Coordination Gross Motor Movements are Fluid  and Coordinated: No Coordination and Movement Description: limited due to weakness Heel Shin Test:  bil LEs wiht reduced speed, accuracy and speed; L>R Motor  Motor Motor: Hemiplegia;Motor impersistence Motor - Skilled Clinical Observations: generalized weakness  Mobility Bed Mobility Bed Mobility: Rolling Right;Rolling Left;Right Sidelying to Sit Rolling Right: 4: Min assist Rolling Right Details: Verbal cues for sequencing;Manual facilitation for weight shifting Rolling Left: 4: Min assist Rolling Left Details: Verbal cues for technique;Tactile cues for weight shifting;Verbal cues for sequencing;Manual facilitation for weight shifting Right Sidelying to Sit: HOB elevated;3: Mod assist Transfers Sit to Stand: 3: Mod assist Sit to Stand Details: Tactile cues for weight shifting;Manual facilitation for weight shifting;Verbal cues for sequencing;Verbal cues for technique Stand to Sit: 3: Mod assist Stand to Sit Details (indicate cue type and reason): Verbal cues for sequencing;Manual facilitation for weight shifting;Tactile cues for weight shifting;Verbal cues for technique Locomotion  Ambulation Ambulation: No Gait Gait: No Stairs / Additional Locomotion Stairs: No Architect: Yes Wheelchair Assistance: 5: Investment banker, operational Details: Verbal cues for Marketing executive: Both lower extermities Wheelchair Parts Management: Needs  assistance Distance: 25  Trunk/Postural Assessment  Cervical Assessment Cervical Assessment: Within Functional Limits Thoracic Assessment Thoracic Assessment: Exceptions to [facility] Lumbar Assessment Lumbar Assessment: Exceptions to [facility] (posterior pelvic tilt) Postural Control Postural Control: Deficits on evaluation Postural Limitations: difficult to assess; L trunk shortening  Balance Balance Balance Assessed: Yes Static Sitting Balance Static Sitting - Level of Assistance: 5: Stand by assistance Dynamic Sitting Balance Dynamic Sitting - Level of Assistance: 5: Stand by assistance Sitting balance - Comments: reaching in all dirctions iwth either hand Extremity Assessment      RLE Assessment RLE Assessment: Exceptions to [facility] RLE Strength RLE Overall Strength Comments: in sitting, grossly, hip flex and knee ext 4/5 with poor muscular endurance; ankle DF 5/5 LLE Assessment LLE Assessment: Exceptions to WFL LLE PROM (degrees) LLE Overall PROM Comments: heel cord tight; -10 degrees ankle DF LLE Strength LLE Overall Strength Comments: grossly in sitting: hip flex/add/abd 2-/5; knee ext /flex 4-/5; ankle DF 4/5   See Function Navigator for Current Functional Status.   Refer to Care Plan for Long Term Goals  Recommendations for other services: None  Discharge Criteria: Patient will be discharged from PT if patient refuses treatment 3 consecutive times without medical reason, if treatment goals not met, if there is a change in medical status, if patient makes no progress towards goals or if patient is discharged from hospital.  The above assessment, treatment plan, treatment alternatives and goals were discussed and mutually agreed upon: by patient  Tailyn Hantz 08/07/2015, 5:27 PM

## 2015-08-08 ENCOUNTER — Inpatient Hospital Stay (HOSPITAL_COMMUNITY): Payer: Medicare Other | Admitting: Occupational Therapy

## 2015-08-08 LAB — GLUCOSE, CAPILLARY
GLUCOSE-CAPILLARY: 114 mg/dL — AB (ref 65–99)
GLUCOSE-CAPILLARY: 133 mg/dL — AB (ref 65–99)
Glucose-Capillary: 218 mg/dL — ABNORMAL HIGH (ref 65–99)
Glucose-Capillary: 188 mg/dL — ABNORMAL HIGH (ref 65–99)

## 2015-08-08 LAB — C DIFFICILE QUICK SCREEN W PCR REFLEX
C DIFFICLE (CDIFF) ANTIGEN: NEGATIVE
C Diff interpretation: NEGATIVE
C Diff toxin: NEGATIVE

## 2015-08-08 MED ORDER — TRAZODONE HCL 50 MG PO TABS
50.0000 mg | ORAL_TABLET | Freq: Every evening | ORAL | Status: DC | PRN
  Administered 2015-08-08 – 2015-08-10 (×3): 50 mg via ORAL
  Filled 2015-08-08 (×3): qty 1

## 2015-08-08 NOTE — Progress Notes (Signed)
Occupational Therapy Session Note  Patient Details  Name: Angelica Beck MRN: [id number] Date of Birth: 1950/12/08  Today's Date: 08/08/2015 OT Individual Time: 8338-2505 OT Individual Time Calculation (min): 65 min    Short Term Goals: Week 1:  OT Short Term Goal 1 (Week 1): Pt will complete toilet transfer with min assist  OT Short Term Goal 2 (Week 1): Pt will complete therapeutic activity in standing for 2 minutes with min assist for balance OT Short Term Goal 3 (Week 1): Pt will complete LB dressing with max assist OT Short Term Goal 4 (Week 1): Pt will complete toileting task with max assist  Skilled Therapeutic Interventions/Progress Updates: Patient participated i n skilled OT today as follows:   Supine to EOB= mod A; EOB to w/c transfer = Min A squat pivot and extra time; UB bath and dressing = min A to pull down bra all around.   LB bath/dressing = max A standing at sink  Patient left in care of nurse at sink completing dressing and oral care     Therapy Documentation Precautions:  Precautions Precautions: Fall Restrictions Weight Bearing Restrictions: No    Pain:denied   See Function Navigator for Current Functional Status.   Therapy/Group: Individual Therapy  Alfredia Ferguson [facility] 08/08/2015, 11:09 AM

## 2015-08-08 NOTE — Progress Notes (Signed)
Pts CBGs high this PM (218). Daughter concerned about no insulin coverage. At home, pt kept within 88-130 range by novolog. Please advise. Will report to oncoming RN.

## 2015-08-08 NOTE — Progress Notes (Signed)
C-Diff test came back negative. MD notified. Enteric precautions removed from room.

## 2015-08-08 NOTE — Progress Notes (Signed)
Subjective/Complaints:  Alert, talking on the phone with her family. Complains that she has not been sleeping well. She usually takes some type of over-the-counter sleep medication at home. Watery stools 4 incontinent on 11/11. Continent soft BM yesterday. No BMs today ROS:  Had stomach pain relieved by BM yest, no SOB, no pain Objective: Vital Signs: Blood pressure 154/90, pulse 112, temperature 98.2 F (36.8 C), temperature source Oral, resp. rate 16, height '5\' 7"'  (1.702 m), weight 99.4 kg (219 lb 2.2 oz), SpO2 100 %. No results found. Results for orders placed or performed during the hospital encounter of 08/06/15 (from the past 72 hour(s))  CBC     Status: Abnormal   Collection Time: 08/06/15  7:13 PM  Result Value Ref Range   WBC 11.5 (H) 4.0 - 10.5 K/uL   RBC 4.37 3.87 - 5.11 MIL/uL   Hemoglobin 10.6 (L) 12.0 - 15.0 g/dL   HCT 33.2 (L) 36.0 - 46.0 %   MCV 76.0 (L) 78.0 - 100.0 fL   MCH 24.3 (L) 26.0 - 34.0 pg   MCHC 31.9 30.0 - 36.0 g/dL   RDW 19.1 (H) 11.5 - 15.5 %   Platelets 182 150 - 400 K/uL  Creatinine, serum     Status: Abnormal   Collection Time: 08/06/15  7:13 PM  Result Value Ref Range   Creatinine, Ser 3.56 (H) 0.44 - 1.00 mg/dL   GFR calc non Af Amer 13 (L) >60 mL/min   GFR calc Af Amer 15 (L) >60 mL/min    Comment: (NOTE) The eGFR has been calculated using the CKD EPI equation. This calculation has not been validated in all clinical situations. eGFR's persistently <60 mL/min signify possible Chronic Kidney Disease.   Glucose, capillary     Status: Abnormal   Collection Time: 08/06/15  8:43 PM  Result Value Ref Range   Glucose-Capillary 221 (H) 65 - 99 mg/dL  Glucose, capillary     Status: Abnormal   Collection Time: 08/07/15 12:04 AM  Result Value Ref Range   Glucose-Capillary 147 (H) 65 - 99 mg/dL  Glucose, capillary     Status: Abnormal   Collection Time: 08/07/15  4:44 AM  Result Value Ref Range   Glucose-Capillary 125 (H) 65 - 99 mg/dL   Glucose, capillary     Status: Abnormal   Collection Time: 08/07/15 12:29 PM  Result Value Ref Range   Glucose-Capillary 118 (H) 65 - 99 mg/dL   Comment 1 Notify RN   Glucose, capillary     Status: Abnormal   Collection Time: 08/07/15  4:39 PM  Result Value Ref Range   Glucose-Capillary 206 (H) 65 - 99 mg/dL   Comment 1 Notify RN   Glucose, capillary     Status: Abnormal   Collection Time: 08/07/15  8:31 PM  Result Value Ref Range   Glucose-Capillary 151 (H) 65 - 99 mg/dL  Glucose, capillary     Status: Abnormal   Collection Time: 08/08/15  6:16 AM  Result Value Ref Range   Glucose-Capillary 114 (H) 65 - 99 mg/dL   Comment 1 Notify RN      HEENT: edentulous Cardio: RRR Resp: CTA B/L and unlabored GI: BS positive and NT, ND Extremity:  No Edema Skin:   Intact Neuro: Lethargic, Flat and Abnormal Motor 4- BUE and 3- BLE Musc/Skel:  Other no pain with UE or LE ROM Gen NAD   Assessment/Plan:  1. Functional deficits secondary to acute encephalopathy which require 3+ hours per day  of interdisciplinary therapy in a comprehensive inpatient rehab setting. Physiatrist is providing close team supervision and 24 hour management of active medical problems listed below. Physiatrist and rehab team continue to assess barriers to discharge/monitor patient progress toward functional and medical goals. FIM: Function - Bathing Body parts bathed by patient: Right arm, Left arm, Chest Body parts bathed by helper: Abdomen, Front perineal area, Buttocks, Right upper leg, Left upper leg, Right lower leg, Left lower leg, Back Assist Level: Touching or steadying assistance(Pt > 75%)  Function- Upper Body Dressing/Undressing What is the patient wearing?: Hospital gown Assist Level: Touching or steadying assistance(Pt > 75%) Function - Lower Body Dressing/Undressing What is the patient wearing?: Hospital Gown, Non-skid slipper socks Position: Wheelchair/chair at sink Non-skid slipper socks-  Performed by helper: Don/doff left sock, Don/doff right sock Assist for footwear: Dependant Assist for lower body dressing: Touching or steadying assistance (Pt > 75%)  Function - Toileting Toileting activity did not occur: No continent bowel/bladder event Toileting steps completed by helper: Adjust clothing prior to toileting, Performs perineal hygiene, Adjust clothing after toileting Toileting Assistive Devices: Grab bar or rail     Function - Chair/bed transfer Chair/bed transfer method: Stand pivot Chair/bed transfer assist level: Moderate assist (Pt 50 - 74%/lift or lower) Chair/bed transfer assistive device: Bedrails, Armrests Chair/bed transfer details: Manual facilitation for weight shifting, Verbal cues for technique, Verbal cues for sequencing, Verbal cues for precautions/safety, Tactile cues for weight shifting  Function - Locomotion: Wheelchair Will patient use wheelchair at discharge?: Yes Type: Motorized Max wheelchair distance: 25 Assist Level: Supervision or verbal cues Turns around,maneuvers to table,bed, and toilet,negotiates 3% grade,maneuvers on rugs and over doorsills: No Function - Locomotion: Ambulation Ambulation activity did not occur: Safety/medical concerns (diarrhea/weakness)  Function - Comprehension Comprehension: Auditory Comprehension assist level: Understands basic 50 - 74% of the time/ requires cueing 25 - 49% of the time  Function - Expression Expression: Verbal Expression assist level: Expresses basic 75 - 89% of the time/requires cueing 10 - 24% of the time. Needs helper to occlude trach/needs to repeat words.  Function - Social Interaction Social Interaction assist level: Interacts appropriately 75 - 89% of the time - Needs redirection for appropriate language or to initiate interaction.  Function - Problem Solving Problem solving assist level: Solves basic 50 - 74% of the time/requires cueing 25 - 49% of the time  Function -  Memory Memory assist level: Recognizes or recalls 25 - 49% of the time/requires cueing 50 - 75% of the time Patient normally able to recall (first 3 days only): Staff names and faces, That he or she is in a hospital  Medical Problem List and Plan: 1. Functional deficits secondary to acute encephalopathy due to urosepsis with prior history of CVA (right pons, basal ganglia infarcts)-initiate therapy program 2.  DVT Prophylaxis/Anticoagulation: Subcutaneous heparin indicated. Monitor platelet counts and any signs of bleeding 3. Pain Management: Tylenol as needed 4. Dysphagia. Dysphagia #2 thin liquids. Continued daily clinicaly reassessment               -advance per SLP             -encourage adequate po 5. Neuropsych: This patient is capable of making decisions on her own behalf. 6. Skin/Wound Care: Routine skin checks 7. Fluids/Electrolytes/Nutrition: Routine I&O with follow-up chemistries on Monday 8. Acute on chronic renal insufficiency. Latest creatinine 4.01 felt to be secondary to ATN and sepsis.     -all labs reviewed with Cr trending down over last  3 days             -encourage po fluids 9. Hypertension. bp's have been poorly controlled.Improving 154/90 this a.m.             -utilize prn clonidine for bp> 200/100 10. Hyperlipidemia. Lipitor 11. Diabetes mellitus and peripheral neuropathy. Hemoglobin A1c 6.3. Liberalize diet.CBG ok this am 12. Klebsiella/pseudomonas Urosepsis:  Continue cipro 13. Watery stools have resolved. Incontinent BM yesterday. We'll DC contact precautions 14. Insomnia not sure what type of over-the-counter medication she used at home, we will add low-dose trazodone LOS (Days) 2 A FACE TO FACE EVALUATION WAS PERFORMED  Angelica Beck 08/08/2015, 10:46 AM

## 2015-08-09 ENCOUNTER — Inpatient Hospital Stay (HOSPITAL_COMMUNITY): Payer: Medicare Other | Admitting: Occupational Therapy

## 2015-08-09 ENCOUNTER — Inpatient Hospital Stay (HOSPITAL_COMMUNITY): Payer: Medicare Other | Admitting: Speech Pathology

## 2015-08-09 ENCOUNTER — Inpatient Hospital Stay (HOSPITAL_COMMUNITY): Payer: Medicare Other | Admitting: Physical Therapy

## 2015-08-09 LAB — CBC WITH DIFFERENTIAL/PLATELET
BASOS ABS: 0 10*3/uL (ref 0.0–0.1)
BASOS PCT: 0 %
EOS ABS: 0.7 10*3/uL (ref 0.0–0.7)
EOS PCT: 7 %
HCT: 29 % — ABNORMAL LOW (ref 36.0–46.0)
Hemoglobin: 9.4 g/dL — ABNORMAL LOW (ref 12.0–15.0)
Lymphocytes Relative: 19 %
Lymphs Abs: 2 10*3/uL (ref 0.7–4.0)
MCH: 24.9 pg — ABNORMAL LOW (ref 26.0–34.0)
MCHC: 32.4 g/dL (ref 30.0–36.0)
MCV: 76.7 fL — ABNORMAL LOW (ref 78.0–100.0)
Monocytes Absolute: 1.3 10*3/uL — ABNORMAL HIGH (ref 0.1–1.0)
Monocytes Relative: 12 %
Neutro Abs: 6.3 10*3/uL (ref 1.7–7.7)
Neutrophils Relative %: 62 %
PLATELETS: 196 10*3/uL (ref 150–400)
RBC: 3.78 MIL/uL — AB (ref 3.87–5.11)
RDW: 19.4 % — ABNORMAL HIGH (ref 11.5–15.5)
WBC: 10.3 10*3/uL (ref 4.0–10.5)

## 2015-08-09 LAB — COMPREHENSIVE METABOLIC PANEL
ALT: 12 U/L — AB (ref 14–54)
AST: 27 U/L (ref 15–41)
Albumin: 2.7 g/dL — ABNORMAL LOW (ref 3.5–5.0)
Alkaline Phosphatase: 49 U/L (ref 38–126)
Anion gap: 9 (ref 5–15)
BUN: 14 mg/dL (ref 6–20)
CHLORIDE: 107 mmol/L (ref 101–111)
CO2: 23 mmol/L (ref 22–32)
CREATININE: 3 mg/dL — AB (ref 0.44–1.00)
Calcium: 9 mg/dL (ref 8.9–10.3)
GFR calc non Af Amer: 16 mL/min — ABNORMAL LOW (ref >60)
GFR, EST AFRICAN AMERICAN: 18 mL/min — AB (ref >60)
Glucose, Bld: 134 mg/dL — ABNORMAL HIGH (ref 65–99)
Potassium: 2.7 mmol/L — CL (ref 3.5–5.1)
SODIUM: 139 mmol/L (ref 135–145)
Total Bilirubin: 0.5 mg/dL (ref 0.3–1.2)
Total Protein: 6.5 g/dL (ref 6.5–8.1)

## 2015-08-09 LAB — GLUCOSE, CAPILLARY
GLUCOSE-CAPILLARY: 174 mg/dL — AB (ref 65–99)
GLUCOSE-CAPILLARY: 148 mg/dL — AB (ref 65–99)
GLUCOSE-CAPILLARY: 176 mg/dL — AB (ref 65–99)
Glucose-Capillary: 121 mg/dL — ABNORMAL HIGH (ref 65–99)

## 2015-08-09 LAB — BASIC METABOLIC PANEL
ANION GAP: 10 (ref 5–15)
BUN: 13 mg/dL (ref 6–20)
CO2: 20 mmol/L — ABNORMAL LOW (ref 22–32)
Calcium: 9.4 mg/dL (ref 8.9–10.3)
Chloride: 109 mmol/L (ref 101–111)
Creatinine, Ser: 2.81 mg/dL — ABNORMAL HIGH (ref 0.44–1.00)
GFR calc Af Amer: 20 mL/min — ABNORMAL LOW (ref >60)
GFR, EST NON AFRICAN AMERICAN: 17 mL/min — AB (ref >60)
GLUCOSE: 209 mg/dL — AB (ref 65–99)
POTASSIUM: 4.1 mmol/L (ref 3.5–5.1)
Sodium: 139 mmol/L (ref 135–145)

## 2015-08-09 LAB — URINE CULTURE
Culture: >=100000
Special Requests: NORMAL

## 2015-08-09 MED ORDER — POTASSIUM CHLORIDE CRYS ER 20 MEQ PO TBCR
40.0000 meq | EXTENDED_RELEASE_TABLET | Freq: Two times a day (BID) | ORAL | Status: DC
  Administered 2015-08-09 – 2015-08-10 (×2): 40 meq via ORAL
  Filled 2015-08-09 (×2): qty 2

## 2015-08-09 MED ORDER — POTASSIUM CHLORIDE CRYS ER 20 MEQ PO TBCR
40.0000 meq | EXTENDED_RELEASE_TABLET | ORAL | Status: AC
  Administered 2015-08-09: 40 meq via ORAL
  Filled 2015-08-09: qty 2

## 2015-08-09 NOTE — Care Management Note (Signed)
[place] Individual Statement of Services  Patient Name:  Angelica Beck  Date:  08/09/2015  Welcome to the [facility].  Our goal is to provide you with an individualized program based on your diagnosis and situation, designed to meet your specific needs.  With this comprehensive rehabilitation program, you will be expected to participate in at least 3 hours of rehabilitation therapies Monday-Friday, with modified therapy programming on the weekends.  Your rehabilitation program will include the following services:  Physical Therapy (PT), Occupational Therapy (OT), Speech Therapy (ST), 24 hour per day rehabilitation nursing, Therapeutic Recreaction (TR), Neuropsychology, Case Management (Social Worker), Rehabilitation Medicine, Nutrition Services and Pharmacy Services  Weekly team conferences will be held on Tuesdays to discuss your progress.  Your Social Worker will talk with you frequently to get your input and to update you on team discussions.  Team conferences with you and your family in attendance may also be held.  Expected length of stay: 14-16 days  Overall anticipated outcome: supervision/ moderate assist  Depending on your progress and recovery, your program may change. Your Social Worker will coordinate services and will keep you informed of any changes. Your Social Worker's name and contact numbers are listed  below.  The following services may also be recommended but are not provided by the [facility]:    [place] will be made to provide these services after discharge if needed.  Arrangements include referral to agencies that provide these services.  Your insurance has been verified to be:  Medicare and Medicaid Your primary doctor is:  Delia Chimes  Pertinent information will be shared with your doctor and your insurance  company.  Social Worker:  [place], [place] or ([phone]   Information discussed with and copy given to patient by: Lennart Pall, 08/09/2015, 3:40 PM

## 2015-08-09 NOTE — Progress Notes (Signed)
Speech Language Pathology Daily Session Note  Patient Details  Name: Angelica Beck MRN: [id number] Date of Birth: 01-21-1951  Today's Date: 08/09/2015 SLP Individual Time: 1400-1500 SLP Individual Time Calculation (min): 60 min  Short Term Goals: Week 1: SLP Short Term Goal 1 (Week 1): Pt will demonstrate adequate oral manipulation of Dys 3 textures across two consecutive sessions to demonstrate readiness for diet advancement SLP Short Term Goal 2 (Week 1): Pt will utilize speech intelligibility strategies within structured tasks at the sentence level with Min cues SLP Short Term Goal 3 (Week 1): Pt will utilize external memory aids to facilitate recall of new and daily information with Min cues  Skilled Therapeutic Interventions: Skilled treatment session focused on dysphagia and cognitive goals. SLP facilitated session by administering trials of regular textures (Cheetos). Patient demonstrated mildly prolonged but efficient mastication despite not having her dentures which she reports she doesn't use to eat. Recommend trial tray of Dys. 3 textures prior to upgrade.  SLP also provided Mod-Max A question cues for utilization of external aids to recall previous therapy sessions and to recall activities that occurred within the sessions. A visual aid was also initiated to maximize recall and carryover of the information.  Patient was ~80% intelligible at the sentence level and required Min A verbal cues for use of a decreased speech rate. Patient left upright in wheelchair with all needs within reach. Continue with current plan of care.    Function:   Cognition Comprehension Comprehension assist level: Understands basic 50 - 74% of the time/ requires cueing 25 - 49% of the time  Expression   Expression assist level: Expresses basic 75 - 89% of the time/requires cueing 10 - 24% of the time. Needs helper to occlude trach/needs to repeat words.  Social Interaction Social Interaction assist  level: Interacts appropriately 75 - 89% of the time - Needs redirection for appropriate language or to initiate interaction.  Problem Solving Problem solving assist level: Solves basic 50 - 74% of the time/requires cueing 25 - 49% of the time  Memory Memory assist level: Recognizes or recalls 50 - 74% of the time/requires cueing 25 - 49% of the time    Pain Pain Assessment Pain Assessment: No/denies pain  Therapy/Group: Individual Therapy  Lucylle Foulkes 08/09/2015, 4:18 PM

## 2015-08-09 NOTE — Progress Notes (Signed)
Occupational Therapy Session Note  Patient Details  Name: Angelica Beck MRN: [id number] Date of Birth: [birth year]  Today's Date: 08/09/2015 OT Individual Time: 7034-0352 and 4818- 1601 OT Individual Time Calculation (min): 58 min and 30 min    Short Term Goals: Week 1:  OT Short Term Goal 1 (Week 1): Pt will complete toilet transfer with min assist  OT Short Term Goal 2 (Week 1): Pt will complete therapeutic activity in standing for 2 minutes with min assist for balance OT Short Term Goal 3 (Week 1): Pt will complete LB dressing with max assist OT Short Term Goal 4 (Week 1): Pt will complete toileting task with max assist  Skilled Therapeutic Interventions/Progress Updates:  Session 1: Upon entering the room, pt seated in wheelchair with RN present in room giving medication. Pt continuing to finish breakfast while therapist gathered all needed items for self care task. Pt requesting to wash at sink as she is unable to access shower/tub at home. Once UB bathing completed, pt reports needing to toilet. OT assisted pt via wheelchair into bathroom. Pt required mod A for lifting onto elevated toilet. Pt requiring increased time and min verbal cues for sequencing and initiating tasks. LB bathing and dressing completed while pt seated on toilet. Pt able to perform toilet hygiene but needing assistance with LB clothing management. Pt returning to wheelchair with stand pivot and use of grab bars. Pt seated in wheelchair with call bell and all needed items within reach upon exiting the room.   Session 2: Upon entering the room, pt seated in wheelchair with no c/o pain. Pt pushing paper towards therapist from previous session , memory aide, and asks, "Write down what we do for my daughter." Pt attempting wheelchair push up this session but unable to clear bottom from cushion. Pt seated on edge of chair and engaged in various levels of reaching requiring lateral and anterior weight shift. Pt required close  supervision for safety. Pt asking various questions about discharge and conference. OT answered questions and pt with no further inquiries at this time. Pt remained in wheelchair with call bell and all needed items within reach.   Therapy Documentation Precautions:  Precautions Precautions: Fall Restrictions Weight Bearing Restrictions: No    See Function Navigator for Current Functional Status.   Therapy/Group: Individual Therapy  Phineas Semen 08/09/2015, 12:24 PM

## 2015-08-09 NOTE — Significant Event (Signed)
CRITICAL VALUE ALERT  Critical value received:  K+   Date of notification:  08/09/2015  Time of notification:  0800  Critical value read back:Yes.    Nurse who received alert:  Dorthula Nettles, RN  MD notified (1st page):  Algis Liming, PA-C  Time of first page:  0800  MD notified (2nd page):  Time of second page:  Responding MD:  0800  Time MD responded:  0800

## 2015-08-09 NOTE — Progress Notes (Signed)
Physical Therapy Session Note  Patient Details  Name: Angelica Beck MRN: [id number] Date of Birth: [birth year]  Today's Date: 08/09/2015 PT Individual Time: 1005-1102 PT Individual Time Calculation (min): 57 min   Short Term Goals: Week 1:  PT Short Term Goal 1 (Week 1): pt will move supine> sit using controls of hospital bed with supervision PT Short Term Goal 2 (Week 1): pt will transfer bed>< w/c with min assist PT Short Term Goal 4 (Week 1): pt will propel w/c x 150' with supervision PT Short Term Goal 5 (Week 1): pt will perform gait x 30' with assistance of 1 person  Skilled Therapeutic Interventions/Progress Updates:   Pt received sitting in w/c and agreeable to therapy session.  Pt voicing need to use bathroom, total assist in w/c to bathroom, max assist squat>pivot from w/c>toilet with pt demonstrating poor safety awareness 2/2 urgency, attempting to pull down pants half way through transfer.  PT provided patient education on safety and pt verbalized understanding.  PT assisted patient with changing pants for time management, max assist sit<>stand and pt performed peri-care, max assist sit<>stand and pt pulled up L side of pants (PT pulled up R side). Pt required seated rest break after peri-care and after LB dressing 2/2 fatigue.  Max assist stand/pivot from toilet>w/c.  PT propelled pt to therapy gym for time management.  PT instructs patient in gait training x28' with RW and +2 for w/c follow. Pt required verbal cues for upright posture, step length, and increased BOS.  PT instructed patient in 2x5 min on Kinetron at 40 cm/s while pt seated in w/c for NMR and activity tolerance.  Pt able to propel w/c x20' with BLEs, states she won't use BUEs because she doesn't want to roll over her fingers, despite education from PT.  Pt demonstrates inefficient propulsion with BLEs 2/2 weakness and coordination deficits which causes fatigue after only short distances.  Pt returned to room and  performed sit<>stand with mod assist for PT to switch out w/c cushion.  Pt positioned in w/c with RN present at end of session, call bell in reach.   Therapy Documentation Precautions:  Precautions Precautions: Fall Restrictions Weight Bearing Restrictions: No Pain: Pain Assessment Pain Assessment: No/denies pain   See Function Navigator for Current Functional Status.   Therapy/Group: Individual Therapy  Earnest Conroy [place] 08/09/2015, 12:38 PM

## 2015-08-09 NOTE — IPOC Note (Addendum)
Overall Plan of Care [facility]) Patient Details Name: Autumnrose Yore MRN: [id number] DOB: 1951-03-08  Admitting Diagnosis: [facility] Problems: Principal Problem:   Acute encephalopathy Active Problems:   Abnormality of gait   History of CVA (cerebrovascular accident)   Chronic systolic HF (heart failure) (HCC)   Bacterial UTI   Sepsis ([place])     Functional Problem List: Nursing Bowel, Bladder, Skin Integrity, Safety  PT Balance, Endurance, Motor, Sensory  OT Balance, Cognition, Endurance, Motor, Safety, Sensory  SLP Cognition  TR         Basic ADL's: OT Grooming, Bathing, Dressing, Toileting     Advanced  ADL's: OT       Transfers: PT Bed Mobility, Bed to Chair, Hydrographic surveyor, Advertising account executive: PT Ambulation, Emergency planning/management officer, Stairs     Additional Impairments: OT Other (comment) (generalized weakness)  SLP Swallowing, Communication, Social Cognition expression Memory  TR      Anticipated Outcomes Item Anticipated Outcome  Self Feeding n/a  Swallowing  Mod I   Basic self-care  min-mod assist  Toileting  mod assist    Bathroom Transfers supervision  Bowel/Bladder  Mod A  Transfers  supervision  Locomotion  supervision w/c x 150 controlled and x 50' home; supervision gait x 50' controlled and home ; steps TBD  Communication  Min  Cognition  Min  Pain  <4  Safety/Judgment  Min A   Therapy Plan: PT Intensity: Minimum of 1-2 x/day ,45 to 90 minutes PT Frequency: 5 out of 7 days PT Duration Estimated Length of Stay: 14-16 OT Intensity: Minimum of 1-2 x/day, 45 to 90 minutes OT Frequency: 5 out of 7 days OT Duration/Estimated Length of Stay: 14-16 days SLP Intensity: Minumum of 1-2 x/day, 30 to 90 minutes SLP Frequency: 3 to 5 out of 7 days SLP Duration/Estimated Length of Stay: 14-16 days       Team Interventions: Nursing Interventions Patient/Family Education, Skin Care/Wound Management, Bladder Management,  Bowel Management, Disease Management/Prevention  PT interventions Ambulation/gait training, Balance/vestibular training, Cognitive remediation/compensation, Discharge planning, Community reintegration, DME/adaptive equipment instruction, Functional electrical stimulation, Functional mobility training, Patient/family education, Neuromuscular re-education, Psychosocial support, Splinting/orthotics, Therapeutic Exercise, Therapeutic Activities, Stair training, UE/LE Strength taining/ROM, UE/LE Coordination activities, Wheelchair propulsion/positioning  OT Interventions Balance/vestibular training, Cognitive remediation/compensation, Discharge planning, Neuromuscular re-education, Functional mobility training, DME/adaptive equipment instruction, Patient/family education, Self Care/advanced ADL retraining, Splinting/orthotics, Visual/perceptual remediation/compensation, UE/LE Coordination activities, UE/LE Strength taining/ROM, Therapeutic Exercise, Therapeutic Activities  SLP Interventions Cognitive remediation/compensation, Cueing hierarchy, Dysphagia/aspiration precaution training, Environmental controls, Functional tasks, Internal/external aids, Patient/family education, Speech/Language facilitation  TR Interventions    SW/CM Interventions  discharge planning, psychosocial support and family education.    Team Discharge Planning: Destination: PT-Home ,OT- Home , SLP-Home Projected Follow-up: PT-Home health PT, OT-  Home health OT, SLP-24 hour supervision/assistance, Home Health SLP Projected Equipment Needs: PT- , OT- None recommended by OT, SLP-None recommended by SLP Equipment Details: PT- , OT-  Patient/family involved in discharge planning: PT- Patient,  OT-Patient, SLP-Patient  MD ELOS: 11-14d Medical Rehab Prognosis:  Good Assessment: 64 y.o. right handed female with history of hypertension, CVA with short-term memory deficits, diabetes mellitus peripheral neuropathy, hyperlipidemia, obesity  hypoventilation syndrome, chronic renal insufficiency with creatinine 1.56 in May 2016. Patient lives with her daughter and has a home health aide 10 AM to 5 PM daily. Used a rolling walker mainly for pivot transfers prior to admission otherwise used a scooter. Aide assist with bathing and dressing  and tub transfers. Over the past few weeks patient and needed assistance with feeding. Presents of 08/02/2015 with nonproductive cough, hypotension, altered mental status with anxiety and restlessness. Noted admission creatinine 6.06 from baseline 1.56 in May 2016. Cranial CT scan showed atrophy widespread abnormal attenuation in the supratentorial white matter as well as lateral left cerebellum and mid pontine region more severe on the right than left. Prior infarct in the anterior left parietal lobe. No acute changes. Chest x-ray negative pneumonia. Abdominal films no acute findings. Renal ultrasound unremarkable. EEG showed no seizure activity. Renal insufficiency felt to be likely ATN secondary to hypotension and possible sepsis with latest creatinine 4.01. Urine culture Klebsiella as well as Pseudomonas and currently maintained on Azactam. Blood cultures negative   Now requiring 24/7 Rehab RN,MD, as well as CIR level PT, OT and SLP.  Treatment team will focus on ADLs and mobility with goals set at Sup - Mod A See Team Conference Notes for weekly updates to the plan of care

## 2015-08-09 NOTE — Progress Notes (Signed)
Subjective/Complaints: Slept better that she got something for sleep last night. Had bm yesterday. Pain is better. States she didn't get much therapy yesterday.  ROS: Pt denies fever, rash/itching, headache, blurred or double vision, nausea, vomiting,  diarrhea, chest pain, shortness of breath, palpitations, dysuria, dizziness, neck or back pain, bleeding, anxiety, or depression  Objective: Vital Signs: Blood pressure 155/62, pulse 79, temperature 98.4 F (36.9 C), temperature source Oral, resp. rate 20, height _0  (1.702 m), weight 99.1 kg (218 lb 7.6 oz), SpO2 100 %. No results found. Results for orders placed or performed during the hospital encounter of 08/06/15 (from the past 72 hour(s))  CBC     Status: Abnormal   Collection Time: 08/06/15  7:13 PM  Result Value Ref Range   WBC 11.5 (H) 4.0 - 10.5 K/uL   RBC 4.37 3.87 - 5.11 MIL/uL   Hemoglobin 10.6 (L) 12.0 - 15.0 g/dL   HCT 33.2 (L) 36.0 - 46.0 %   MCV 76.0 (L) 78.0 - 100.0 fL   MCH 24.3 (L) 26.0 - 34.0 pg   MCHC 31.9 30.0 - 36.0 g/dL   RDW 19.1 (H) 11.5 - 15.5 %   Platelets 182 150 - 400 K/uL  Creatinine, serum     Status: Abnormal   Collection Time: 08/06/15  7:13 PM  Result Value Ref Range   Creatinine, Ser 3.56 (H) 0.44 - 1.00 mg/dL   GFR calc non Af Amer 13 (L) >60 mL/min   GFR calc Af Amer 15 (L) >60 mL/min    Comment: (NOTE) The eGFR has been calculated using the CKD EPI equation. This calculation has not been validated in all clinical situations. eGFR's persistently <60 mL/min signify possible Chronic Kidney Disease.   Glucose, capillary     Status: Abnormal   Collection Time: 08/06/15  8:43 PM  Result Value Ref Range   Glucose-Capillary 221 (H) 65 - 99 mg/dL  Glucose, capillary     Status: Abnormal   Collection Time: 08/07/15 12:04 AM  Result Value Ref Range   Glucose-Capillary 147 (H) 65 - 99 mg/dL  Glucose, capillary     Status: Abnormal   Collection Time: 08/07/15  4:44 AM  Result Value Ref Range    Glucose-Capillary 125 (H) 65 - 99 mg/dL  Glucose, capillary     Status: Abnormal   Collection Time: 08/07/15 12:29 PM  Result Value Ref Range   Glucose-Capillary 118 (H) 65 - 99 mg/dL   Comment 1 Notify RN   Glucose, capillary     Status: Abnormal   Collection Time: 08/07/15  4:39 PM  Result Value Ref Range   Glucose-Capillary 206 (H) 65 - 99 mg/dL   Comment 1 Notify RN   Glucose, capillary     Status: Abnormal   Collection Time: 08/07/15  8:31 PM  Result Value Ref Range   Glucose-Capillary 151 (H) 65 - 99 mg/dL  Glucose, capillary     Status: Abnormal   Collection Time: 08/08/15  6:16 AM  Result Value Ref Range   Glucose-Capillary 114 (H) 65 - 99 mg/dL   Comment 1 Notify RN   C difficile quick scan w PCR reflex     Status: None   Collection Time: 08/08/15  9:15 AM  Result Value Ref Range   C Diff antigen NEGATIVE NEGATIVE   C Diff toxin NEGATIVE NEGATIVE   C Diff interpretation Negative for toxigenic C. difficile   Glucose, capillary     Status: Abnormal   Collection Time: 08/08/15  11:37 AM  Result Value Ref Range   Glucose-Capillary 133 (H) 65 - 99 mg/dL  Glucose, capillary     Status: Abnormal   Collection Time: 08/08/15  4:54 PM  Result Value Ref Range   Glucose-Capillary 218 (H) 65 - 99 mg/dL  Glucose, capillary     Status: Abnormal   Collection Time: 08/08/15  9:00 PM  Result Value Ref Range   Glucose-Capillary 188 (H) 65 - 99 mg/dL  Glucose, capillary     Status: Abnormal   Collection Time: 08/09/15  7:07 AM  Result Value Ref Range   Glucose-Capillary 121 (H) 65 - 99 mg/dL   Comment 1 Notify RN   CBC WITH DIFFERENTIAL     Status: Abnormal   Collection Time: 08/09/15  7:09 AM  Result Value Ref Range   WBC 10.3 4.0 - 10.5 K/uL   RBC 3.78 (L) 3.87 - 5.11 MIL/uL   Hemoglobin 9.4 (L) 12.0 - 15.0 g/dL   HCT 29.0 (L) 36.0 - 46.0 %   MCV 76.7 (L) 78.0 - 100.0 fL   MCH 24.9 (L) 26.0 - 34.0 pg   MCHC 32.4 30.0 - 36.0 g/dL   RDW 19.4 (H) 11.5 - 15.5 %   Platelets  196 150 - 400 K/uL   Neutrophils Relative % 62 %   Neutro Abs 6.3 1.7 - 7.7 K/uL   Lymphocytes Relative 19 %   Lymphs Abs 2.0 0.7 - 4.0 K/uL   Monocytes Relative 12 %   Monocytes Absolute 1.3 (H) 0.1 - 1.0 K/uL   Eosinophils Relative 7 %   Eosinophils Absolute 0.7 0.0 - 0.7 K/uL   Basophils Relative 0 %   Basophils Absolute 0.0 0.0 - 0.1 K/uL  Comprehensive metabolic panel     Status: Abnormal   Collection Time: 08/09/15  7:09 AM  Result Value Ref Range   Sodium 139 135 - 145 mmol/L   Potassium 2.7 (LL) 3.5 - 5.1 mmol/L    Comment: CRITICAL RESULT CALLED TO, READ BACK BY AND VERIFIED WITH: JENNINGS,S RN 11.14.16 [id number]    Chloride 107 101 - 111 mmol/L   CO2 23 22 - 32 mmol/L   Glucose, Bld 134 (H) 65 - 99 mg/dL   BUN 14 6 - 20 mg/dL   Creatinine, Ser 3.00 (H) 0.44 - 1.00 mg/dL   Calcium 9.0 8.9 - 10.3 mg/dL   Total Protein 6.5 6.5 - 8.1 g/dL   Albumin 2.7 (L) 3.5 - 5.0 g/dL   AST 27 15 - 41 U/L   ALT 12 (L) 14 - 54 U/L   Alkaline Phosphatase 49 38 - 126 U/L   Total Bilirubin 0.5 0.3 - 1.2 mg/dL   GFR calc non Af Amer 16 (L) >60 mL/min   GFR calc Af Amer 18 (L) >60 mL/min    Comment: (NOTE) The eGFR has been calculated using the CKD EPI equation. This calculation has not been validated in all clinical situations. eGFR's persistently <60 mL/min signify possible Chronic Kidney Disease.    Anion gap 9 5 - 15     HEENT: edentulous Cardio: RRR Resp: CTA B/L and unlabored GI: BS positive and NT, ND Extremity:  No Edema Skin:   Intact Neuro: Lethargic, Flat and Abnormal Motor 4- BUE and 3- BLE Musc/Skel:  Other no pain with UE or LE ROM Gen NAD   Assessment/Plan:  1. Functional deficits secondary to acute encephalopathy which require 3+ hours per day of interdisciplinary therapy in a comprehensive inpatient rehab setting.  Physiatrist is providing close team supervision and 24 hour management of active medical problems listed below. Physiatrist and rehab team  continue to assess barriers to discharge/monitor patient progress toward functional and medical goals. FIM: Function - Bathing Position: Wheelchair/chair at sink Body parts bathed by patient: Right arm, Chest, Abdomen, Right upper leg, Left upper leg Body parts bathed by helper: Left arm, Front perineal area, Buttocks, Right lower leg, Left lower leg, Back Assist Level: Touching or steadying assistance(Pt > 75%)  Function- Upper Body Dressing/Undressing What is the patient wearing?: Pull over shirt/dress, Bra Bra - Perfomed by patient: Thread/unthread right bra strap, Thread/unthread left bra strap Bra - Perfomed by helper: Hook/unhook bra (pull down sports bra) Pull over shirt/dress - Perfomed by patient: Thread/unthread right sleeve, Thread/unthread left sleeve, Put head through opening, Pull shirt over trunk Assist Level: Touching or steadying assistance(Pt > 75%) Function - Lower Body Dressing/Undressing What is the patient wearing?: Pants, Socks Position: Wheelchair/chair at sink Pants- Performed by helper: Thread/unthread right pants leg, Thread/unthread left pants leg, Pull pants up/down, Fasten/unfasten pants Non-skid slipper socks- Performed by helper: Don/doff right sock, Don/doff left sock Socks - Performed by helper: Don/doff right sock, Don/doff left sock Assist for footwear: Maximal assist Assist for lower body dressing: Touching or steadying assistance (Pt > 75%)  Function - Toileting Toileting activity did not occur: N/A (voiced no need) Toileting steps completed by helper: Adjust clothing prior to toileting, Performs perineal hygiene, Adjust clothing after toileting Toileting Assistive Devices: Grab bar or rail  Function - Air cabin crew transfer activity did not occur: N/A (voiced no need) Toilet transfer assistive device: Elevated toilet seat/BSC over toilet Assist level to toilet: Moderate assist (Pt 50 - 74%/lift or lower) Assist level from toilet:  Moderate assist (Pt 50 - 74%/lift or lower)  Function - Chair/bed transfer Chair/bed transfer method: Stand pivot Chair/bed transfer assist level: Moderate assist (Pt 50 - 74%/lift or lower) Chair/bed transfer assistive device: Bedrails, Armrests Chair/bed transfer details: Manual facilitation for weight shifting, Verbal cues for technique, Verbal cues for sequencing, Verbal cues for precautions/safety, Tactile cues for weight shifting  Function - Locomotion: Wheelchair Will patient use wheelchair at discharge?: Yes Type: Motorized Max wheelchair distance: 25 Assist Level: Supervision or verbal cues Turns around,maneuvers to table,bed, and toilet,negotiates 3% grade,maneuvers on rugs and over doorsills: No Function - Locomotion: Ambulation Ambulation activity did not occur: Safety/medical concerns (diarrhea/weakness)  Function - Comprehension Comprehension: Auditory Comprehension assist level: Understands basic 50 - 74% of the time/ requires cueing 25 - 49% of the time  Function - Expression Expression: Verbal Expression assist level: Expresses basic 75 - 89% of the time/requires cueing 10 - 24% of the time. Needs helper to occlude trach/needs to repeat words.  Function - Social Interaction Social Interaction assist level: Interacts appropriately 75 - 89% of the time - Needs redirection for appropriate language or to initiate interaction.  Function - Problem Solving Problem solving assist level: Solves basic 50 - 74% of the time/requires cueing 25 - 49% of the time  Function - Memory Memory assist level: Recognizes or recalls 25 - 49% of the time/requires cueing 50 - 75% of the time Patient normally able to recall (first 3 days only): That he or she is in a hospital, Staff names and faces, Current season  Medical Problem List and Plan: 1. Functional deficits secondary to acute encephalopathy due to urosepsis with prior history of CVA (right pons, basal ganglia infarcts)-initiate  therapy program 2.  DVT Prophylaxis/Anticoagulation: Subcutaneous  heparin indicated. Monitor platelet counts and any signs of bleeding 3. Pain Management: Tylenol as needed 4. Dysphagia. Dysphagia #2 thin liquids. Continued daily clinicaly reassessment               -advance per SLP             -encourage adequate po 5. Neuropsych: This patient is capable of making decisions on her own behalf. 6. Skin/Wound Care: Routine skin checks 7. Fluids/Electrolytes/Nutrition: I personally reviewed the patient's labs today.   -hypokalemia, replete, re-check labs 8. Acute on chronic renal insufficiency. Latest creatinine 3.0       -all labs reviewed again in person today. Cr continues to drift downward             -encourage po fluids 9. Hypertension. bp's have been poorly controlled.Improving 154/90 this a.m.             -utilize prn clonidine for bp> 200/100 10. Hyperlipidemia. Lipitor 11. Diabetes mellitus and peripheral neuropathy. Hemoglobin A1c 6.3. Liberalize diet.  -will follow for pattern at present  -lantus 23 units qhs---cbg's higher in pm 12. Klebsiella/pseudomonas Urosepsis:  Continue oral cipro 13. Watery stools have resolved.---observation only for now 14. Insomnia: low-dose trazodone appears to be effective LOS (Days) 3 A FACE TO FACE EVALUATION WAS PERFORMED  Willene Holian T 08/09/2015, 9:35 AM

## 2015-08-10 ENCOUNTER — Inpatient Hospital Stay (HOSPITAL_COMMUNITY): Payer: Medicare Other | Admitting: Speech Pathology

## 2015-08-10 ENCOUNTER — Inpatient Hospital Stay (HOSPITAL_COMMUNITY): Payer: Medicare Other | Admitting: Physical Therapy

## 2015-08-10 ENCOUNTER — Inpatient Hospital Stay (HOSPITAL_COMMUNITY): Payer: Medicare Other

## 2015-08-10 LAB — BASIC METABOLIC PANEL
ANION GAP: 6 (ref 5–15)
BUN: 11 mg/dL (ref 6–20)
CHLORIDE: 107 mmol/L (ref 101–111)
CO2: 25 mmol/L (ref 22–32)
Calcium: 9.2 mg/dL (ref 8.9–10.3)
Creatinine, Ser: 2.85 mg/dL — ABNORMAL HIGH (ref 0.44–1.00)
GFR calc non Af Amer: 17 mL/min — ABNORMAL LOW (ref >60)
GFR, EST AFRICAN AMERICAN: 19 mL/min — AB (ref >60)
GLUCOSE: 99 mg/dL (ref 65–99)
Potassium: 3.4 mmol/L — ABNORMAL LOW (ref 3.5–5.1)
Sodium: 138 mmol/L (ref 135–145)

## 2015-08-10 LAB — GLUCOSE, CAPILLARY
GLUCOSE-CAPILLARY: 149 mg/dL — AB (ref 65–99)
GLUCOSE-CAPILLARY: 187 mg/dL — AB (ref 65–99)
GLUCOSE-CAPILLARY: 265 mg/dL — AB (ref 65–99)
Glucose-Capillary: 95 mg/dL (ref 65–99)

## 2015-08-10 MED ORDER — POTASSIUM CHLORIDE CRYS ER 20 MEQ PO TBCR
20.0000 meq | EXTENDED_RELEASE_TABLET | Freq: Two times a day (BID) | ORAL | Status: DC
  Administered 2015-08-10 – 2015-08-17 (×14): 20 meq via ORAL
  Filled 2015-08-10 (×14): qty 1

## 2015-08-10 NOTE — Progress Notes (Signed)
Social Work  Social Work Assessment and Plan  Patient Details  Name: Angelica Beck MRN: [id number] Date of Birth: 07/18/1951  Today's Date: 08/09/2015  Problem List:  Patient Active Problem List   Diagnosis Date Noted  . Bacterial UTI 08/06/2015  . Sepsis ([place]) 08/06/2015  . Altered mental status   . Lactic acidosis 08/02/2015  . Acute encephalopathy 08/02/2015  . History of CVA (cerebrovascular accident) 08/02/2015  . Chronic systolic HF (heart failure) ([place]) 08/02/2015  . Cognitive impairment 08/02/2015  . AKI (acute kidney injury) ([place]) 02/08/2015  . Abnormality of gait 02/08/2015  . Hyperthyroidism 02/08/2015  . Type 2 diabetes mellitus ([place]) 02/08/2015  . Chest pain 01/19/2014  . Obesity hypoventilation syndrome ([place]) 06/20/2013  . HYPERTHYROIDISM 06/28/2009  . DIAB W/UNS COMP TYPE II/UNS NOT STATED UNCNTRL 06/28/2009  . HYPERLIPIDEMIA-MIXED 06/28/2009  . OVERWEIGHT/OBESITY 06/28/2009  . HYPERTENSION, BENIGN 06/28/2009  . CAD, NATIVE VESSEL 06/28/2009  . DYSPNEA 06/28/2009   Past Medical History:  Past Medical History  Diagnosis Date  . Hypertension   . Stroke ([place])   . Arthritis   . Diabetes mellitus, type 2 ([place])   . Hyperlipidemia   . Hyperthyroidism   . Obesity hypoventilation syndrome ([place]) 06/20/2013   Past Surgical History:  Past Surgical History  Procedure Laterality Date  . Ventral hernia repair     Social History:  reports that she quit smoking about 12 years ago. Her smoking use included Cigarettes. She has a 7.5 pack-year smoking history. She has never used smokeless tobacco. She reports that she does not drink alcohol or use illicit drugs.  Family / Support Systems Marital Status: Single Patient Roles: Parent, Other (Comment) (grandparent) Children: daughter (only child), [name] @ (C) [phone] Other Supports: two adult grandchildren living locally;  CAP aide daily Anticipated Caregiver: Aide daily and daughter after work and  nights Ability/Limitations of Caregiver: Daughter works 7 am to 3 pm.  Aide comes 10 am to 5 pm daily Caregiver Availability: Other (Comment) (daughter reports pt alone approx 6a-10a) Family Dynamics: Daughter very supportive and attempts to provide any assistance needed, however, she does work f/t.  Social History Preferred language: English Religion:  Cultural Background: NA Read: Yes Write: Yes Employment Status: Disabled Date Retired/Disabled/Unemployed: ~20 yrs "because of my blood pressure" Legal Hisotry/Current Legal Issues: None Guardian/Conservator: None - per MD, pt capable of making decisions on her own behalf.   Abuse/Neglect Physical Abuse: Denies Verbal Abuse: Denies Sexual Abuse: Denies Exploitation of patient/patient's resources: Denies Self-Neglect: Denies  Emotional Status Pt's affect, behavior adn adjustment status: Pt able to complete assessment interveiw without difficulty.  Information provided confirmed as accurate by daughter.  Pt admits she is frustrated by her physical decline, however, denies any s/s of any emotional distress.  Daughter denies witnessing any emoional distress.  Will monitor and refer for formal depression screen via neuropsychology as indicated. Recent Psychosocial Issues: None significant.  Pt reports that she moved to [place] several years ago with her daughter and she has been providing care to her. Pyschiatric History: None Substance Abuse History: None  Patient / Family Perceptions, Expectations & Goals Pt/Family understanding of illness & functional limitations: Pt reports that she was admitted to hospital "for a bladder infection".  Describes herself as "weak now" and requiring more assistance than PTA.  Daughter aware that MD team feels pt with encephalopathy related to UTI/ sepsis.  Daughter with good understanding of pt's new functional/ cognitive deficits and need for CIR. Premorbid pt/family roles/activities: Pt required  assistance  with ADLs and had assist of family and CAP aide close to 24/7 (alone apprs 4 hrs in the morning.) Anticipated changes in roles/activities/participation: Have explained to daughter that likely team will recommend 24/7 assistance - family will need to determine if this can be arranged. Pt/family expectations/goals: "I just need to get stronger"  [employer]: Other (Comment) (CAP services) Transportation available at discharge: yes Resource referrals recommended: Neuropsychology  Discharge Planning Living Arrangements: Children Support Systems: Children, Other relatives, Home care staff Type of Residence: Private residence Insurance Resources: Medicare, [place] (specify county) Pensions consultant: Fish farm manager, [place], Family Support Financial Screen Referred: No Living Expenses: Lives with family Money Management: Family Does the patient have any problems obtaining your medications?: No Home Management: daughter is primary support of home Patient/Family Preliminary Plans: Pt to d/c home with family vs may need to consider SNF Barriers to Discharge: Family Support (Pt alone apprx 4 hrs per day) Social Work Anticipated Follow Up Needs: HH/OP, SNF Expected length of stay: 14-16 days  Clinical Impression AAF here following urosepsis/ encephalopathic event.  Now with physical and cognitive deficits and attempting to determine current functional level vs premorbid as pt did have assistance approx 20 hrs in the home (prior CVA 4 yrs ago.)  Daughter, Angelica Beck, very supportive, however she does work.  Not sure if family will be able to increase their hours of support in anticipation that tx team will recommend 24/7 assistance.  Will follow for further d/c planning discussion and support.  Tandre Conly 08/10/2015, 12:16 PM

## 2015-08-10 NOTE — Progress Notes (Signed)
Occupational Therapy Session Note  Patient Details  Name: Angelica Beck MRN: [id number] Date of Birth: March 28, 1951  Today's Date: 08/10/2015 OT Individual Time: 3846-6599 OT Individual Time Calculation (min): 75 min    Short Term Goals: Week 1:  OT Short Term Goal 1 (Week 1): Pt will complete toilet transfer with min assist  OT Short Term Goal 2 (Week 1): Pt will complete therapeutic activity in standing for 2 minutes with min assist for balance OT Short Term Goal 3 (Week 1): Pt will complete LB dressing with max assist OT Short Term Goal 4 (Week 1): Pt will complete toileting task with max assist  Skilled Therapeutic Interventions/Progress Updates:    Pt resting in bed upon arrival.  Pt required min encouragement to engage in BADL retraining at sink.  Pt participated in BADL retraining including bathing and dressing with sit<>stand from w/c at sink.  Pt required min A and extra time to perform supine->sit EOB with HOB elevated.  Pt required min A for stand/squat pivot transfer to w/c.  Pt required assistance bathing LB at sink.  When standing to pull up underpants, pt stated she needed to use bathroom and was incontinent of bladder before being able to sit on toilet.  Pt completed LB bathing and dressing tasks while seated on toilet (pt's recommendation). Pt requires more than a reasonable amount of time to complete tasks.  Pt stated that her daughter did most of the bathing/dressing tasks for her because her daughter was always rushed to get to work.  Pt is appropriate with directing care and attempts to complete tasks once initiated.  Focus on functional transfers, activity tolerance, sit<>stand, standing balance, BADL retraining, toilet transfers, toileting, and safety awareness.  Therapy Documentation Precautions:  Precautions Precautions: Fall Restrictions Weight Bearing Restrictions: No   Pain:  Pt denied pain  See Function Navigator for Current Functional  Status.   Therapy/Group: Individual Therapy  Leroy Libman 08/10/2015, 8:16 AM

## 2015-08-10 NOTE — Progress Notes (Signed)
Subjective/Complaints: Up with therapy this morning.  Had a fair night.   ROS: Pt denies fever, rash/itching, headache, blurred or double vision, nausea, vomiting,  diarrhea, chest pain, shortness of breath, palpitations, dysuria, dizziness, neck or back pain, bleeding, anxiety, or depression  Objective: Vital Signs: Blood pressure 165/77, pulse 87, temperature 98.2 F (36.8 C), temperature source Oral, resp. rate 18, height '5\' 7"'  (1.702 m), weight 98.7 kg (217 lb 9.5 oz), SpO2 100 %. No results found. Results for orders placed or performed during the hospital encounter of 08/06/15 (from the past 72 hour(s))  Glucose, capillary     Status: Abnormal   Collection Time: 08/07/15 12:29 PM  Result Value Ref Range   Glucose-Capillary 118 (H) 65 - 99 mg/dL   Comment 1 Notify RN   Glucose, capillary     Status: Abnormal   Collection Time: 08/07/15  4:39 PM  Result Value Ref Range   Glucose-Capillary 206 (H) 65 - 99 mg/dL   Comment 1 Notify RN   Glucose, capillary     Status: Abnormal   Collection Time: 08/07/15  8:31 PM  Result Value Ref Range   Glucose-Capillary 151 (H) 65 - 99 mg/dL  Glucose, capillary     Status: Abnormal   Collection Time: 08/08/15  6:16 AM  Result Value Ref Range   Glucose-Capillary 114 (H) 65 - 99 mg/dL   Comment 1 Notify RN   C difficile quick scan w PCR reflex     Status: None   Collection Time: 08/08/15  9:15 AM  Result Value Ref Range   C Diff antigen NEGATIVE NEGATIVE   C Diff toxin NEGATIVE NEGATIVE   C Diff interpretation Negative for toxigenic C. difficile   Glucose, capillary     Status: Abnormal   Collection Time: 08/08/15 11:37 AM  Result Value Ref Range   Glucose-Capillary 133 (H) 65 - 99 mg/dL  Glucose, capillary     Status: Abnormal   Collection Time: 08/08/15  4:54 PM  Result Value Ref Range   Glucose-Capillary 218 (H) 65 - 99 mg/dL  Glucose, capillary     Status: Abnormal   Collection Time: 08/08/15  9:00 PM  Result Value Ref Range    Glucose-Capillary 188 (H) 65 - 99 mg/dL  Glucose, capillary     Status: Abnormal   Collection Time: 08/09/15  7:07 AM  Result Value Ref Range   Glucose-Capillary 121 (H) 65 - 99 mg/dL   Comment 1 Notify RN   CBC WITH DIFFERENTIAL     Status: Abnormal   Collection Time: 08/09/15  7:09 AM  Result Value Ref Range   WBC 10.3 4.0 - 10.5 K/uL   RBC 3.78 (L) 3.87 - 5.11 MIL/uL   Hemoglobin 9.4 (L) 12.0 - 15.0 g/dL   HCT 29.0 (L) 36.0 - 46.0 %   MCV 76.7 (L) 78.0 - 100.0 fL   MCH 24.9 (L) 26.0 - 34.0 pg   MCHC 32.4 30.0 - 36.0 g/dL   RDW 19.4 (H) 11.5 - 15.5 %   Platelets 196 150 - 400 K/uL   Neutrophils Relative % 62 %   Neutro Abs 6.3 1.7 - 7.7 K/uL   Lymphocytes Relative 19 %   Lymphs Abs 2.0 0.7 - 4.0 K/uL   Monocytes Relative 12 %   Monocytes Absolute 1.3 (H) 0.1 - 1.0 K/uL   Eosinophils Relative 7 %   Eosinophils Absolute 0.7 0.0 - 0.7 K/uL   Basophils Relative 0 %   Basophils Absolute 0.0 0.0 -  0.1 K/uL  Comprehensive metabolic panel     Status: Abnormal   Collection Time: 08/09/15  7:09 AM  Result Value Ref Range   Sodium 139 135 - 145 mmol/L   Potassium 2.7 (LL) 3.5 - 5.1 mmol/L    Comment: CRITICAL RESULT CALLED TO, READ BACK BY AND VERIFIED WITH: JENNINGS,S RN 11.14.16 [id number]    Chloride 107 101 - 111 mmol/L   CO2 23 22 - 32 mmol/L   Glucose, Bld 134 (H) 65 - 99 mg/dL   BUN 14 6 - 20 mg/dL   Creatinine, Ser 3.00 (H) 0.44 - 1.00 mg/dL   Calcium 9.0 8.9 - 10.3 mg/dL   Total Protein 6.5 6.5 - 8.1 g/dL   Albumin 2.7 (L) 3.5 - 5.0 g/dL   AST 27 15 - 41 U/L   ALT 12 (L) 14 - 54 U/L   Alkaline Phosphatase 49 38 - 126 U/L   Total Bilirubin 0.5 0.3 - 1.2 mg/dL   GFR calc non Af Amer 16 (L) >60 mL/min   GFR calc Af Amer 18 (L) >60 mL/min    Comment: (NOTE) The eGFR has been calculated using the CKD EPI equation. This calculation has not been validated in all clinical situations. eGFR's persistently <60 mL/min signify possible Chronic Kidney Disease.    Anion  gap 9 5 - 15  Glucose, capillary     Status: Abnormal   Collection Time: 08/09/15 11:19 AM  Result Value Ref Range   Glucose-Capillary 174 (H) 65 - 99 mg/dL  Basic metabolic panel     Status: Abnormal   Collection Time: 08/09/15  2:53 PM  Result Value Ref Range   Sodium 139 135 - 145 mmol/L   Potassium 4.1 3.5 - 5.1 mmol/L    Comment: NO VISIBLE HEMOLYSIS   Chloride 109 101 - 111 mmol/L   CO2 20 (L) 22 - 32 mmol/L   Glucose, Bld 209 (H) 65 - 99 mg/dL   BUN 13 6 - 20 mg/dL   Creatinine, Ser 2.81 (H) 0.44 - 1.00 mg/dL   Calcium 9.4 8.9 - 10.3 mg/dL   GFR calc non Af Amer 17 (L) >60 mL/min   GFR calc Af Amer 20 (L) >60 mL/min    Comment: (NOTE) The eGFR has been calculated using the CKD EPI equation. This calculation has not been validated in all clinical situations. eGFR's persistently <60 mL/min signify possible Chronic Kidney Disease.    Anion gap 10 5 - 15  Glucose, capillary     Status: Abnormal   Collection Time: 08/09/15  4:42 PM  Result Value Ref Range   Glucose-Capillary 148 (H) 65 - 99 mg/dL  Glucose, capillary     Status: Abnormal   Collection Time: 08/09/15  8:41 PM  Result Value Ref Range   Glucose-Capillary 176 (H) 65 - 99 mg/dL   Comment 1 Notify RN   Basic metabolic panel     Status: Abnormal   Collection Time: 08/10/15  4:51 AM  Result Value Ref Range   Sodium 138 135 - 145 mmol/L   Potassium 3.4 (L) 3.5 - 5.1 mmol/L    Comment: DELTA CHECK NOTED   Chloride 107 101 - 111 mmol/L   CO2 25 22 - 32 mmol/L   Glucose, Bld 99 65 - 99 mg/dL   BUN 11 6 - 20 mg/dL   Creatinine, Ser 2.85 (H) 0.44 - 1.00 mg/dL   Calcium 9.2 8.9 - 10.3 mg/dL   GFR calc non Af Amer 17 (  L) >60 mL/min   GFR calc Af Amer 19 (L) >60 mL/min    Comment: (NOTE) The eGFR has been calculated using the CKD EPI equation. This calculation has not been validated in all clinical situations. eGFR's persistently <60 mL/min signify possible Chronic Kidney Disease.    Anion gap 6 5 - 15   Glucose, capillary     Status: None   Collection Time: 08/10/15  6:40 AM  Result Value Ref Range   Glucose-Capillary 95 65 - 99 mg/dL   Comment 1 Notify RN      HEENT: edentulous Cardio: RRR Resp: CTA B/L and unlabored GI: BS positive and NT, ND Extremity:  No Edema Skin:   Intact Neuro: Lethargic, Flat and Abnormal Motor 4- BUE and 3- BLE Musc/Skel:  Other no pain with UE or LE ROM Gen NAD   Assessment/Plan:  1. Functional deficits secondary to acute encephalopathy which require 3+ hours per day of interdisciplinary therapy in a comprehensive inpatient rehab setting. Physiatrist is providing close team supervision and 24 hour management of active medical problems listed below. Physiatrist and rehab team continue to assess barriers to discharge/monitor patient progress toward functional and medical goals. FIM: Function - Bathing Position: Wheelchair/chair at sink Body parts bathed by patient: Right arm, Left arm, Chest, Abdomen, Front perineal area, Right upper leg, Left upper leg Body parts bathed by helper: Buttocks, Right lower leg, Left lower leg, Back Assist Level:  (mod A)  Function- Upper Body Dressing/Undressing What is the patient wearing?: Bra, Pull over shirt/dress Bra - Perfomed by patient: Thread/unthread right bra strap, Thread/unthread left bra strap Bra - Perfomed by helper: Hook/unhook bra (pull down sports bra) Pull over shirt/dress - Perfomed by patient: Thread/unthread right sleeve, Thread/unthread left sleeve, Put head through opening, Pull shirt over trunk Assist Level: Touching or steadying assistance(Pt > 75%) Function - Lower Body Dressing/Undressing What is the patient wearing?: Pants, Underwear, Socks, Shoes Position: Other (comment) (sitting on toilet) Underwear - Performed by patient: Thread/unthread right underwear leg, Pull underwear up/down, Thread/unthread left underwear leg Underwear - Performed by helper: Thread/unthread right underwear leg,  Thread/unthread left underwear leg Pants- Performed by helper: Thread/unthread right pants leg, Thread/unthread left pants leg, Pull pants up/down, Fasten/unfasten pants Non-skid slipper socks- Performed by helper: Don/doff right sock, Don/doff left sock Socks - Performed by helper: Don/doff right sock, Don/doff left sock Shoes - Performed by helper: Don/doff right shoe, Don/doff left shoe, Fasten right, Fasten left Assist for footwear: Dependant Assist for lower body dressing:  (total A)  Function - Toileting Toileting activity did not occur: N/A (voiced no need) Toileting steps completed by patient: Performs perineal hygiene Toileting steps completed by helper: Adjust clothing prior to toileting, Adjust clothing after toileting Toileting Assistive Devices: Grab bar or rail Assist level: Touching or steadying assistance (Pt.75%)  Function - Air cabin crew transfer activity did not occur: N/A (voiced no need) Toilet transfer assistive device: Elevated toilet seat/BSC over toilet, Grab bar Assist level to toilet: Moderate assist (Pt 50 - 74%/lift or lower) Assist level from toilet: Moderate assist (Pt 50 - 74%/lift or lower)  Function - Chair/bed transfer Chair/bed transfer method: Stand pivot Chair/bed transfer assist level: Maximal assist (Pt 25 - 49%/lift and lower) Chair/bed transfer assistive device: Armrests Chair/bed transfer details: Manual facilitation for weight shifting, Verbal cues for technique, Verbal cues for sequencing, Verbal cues for precautions/safety, Tactile cues for weight shifting  Function - Locomotion: Wheelchair Will patient use wheelchair at discharge?: Yes Type: Manual Max wheelchair  distance: 20 Assist Level: Supervision or verbal cues Turns around,maneuvers to table,bed, and toilet,negotiates 3% grade,maneuvers on rugs and over doorsills: No Function - Locomotion: Ambulation Ambulation activity did not occur: Safety/medical concerns  (diarrhea/weakness) Assistive device: Walker-rolling Max distance: 28 Assist level: 2 helpers (+2 for w/c follow, pt required mod assist) Assist level: 2 helpers (+2 for w/c follow, pt required mod assist) Walk 50 feet with 2 turns activity did not occur: Safety/medical concerns Walk 150 feet activity did not occur: Safety/medical concerns Walk 10 feet on uneven surfaces activity did not occur: Safety/medical concerns  Function - Comprehension Comprehension: Auditory Comprehension assist level: Understands basic 50 - 74% of the time/ requires cueing 25 - 49% of the time  Function - Expression Expression: Verbal Expression assist level: Expresses basic 75 - 89% of the time/requires cueing 10 - 24% of the time. Needs helper to occlude trach/needs to repeat words.  Function - Social Interaction Social Interaction assist level: Interacts appropriately 75 - 89% of the time - Needs redirection for appropriate language or to initiate interaction.  Function - Problem Solving Problem solving assist level: Solves basic 50 - 74% of the time/requires cueing 25 - 49% of the time  Function - Memory Memory assist level: Recognizes or recalls 50 - 74% of the time/requires cueing 25 - 49% of the time Patient normally able to recall (first 3 days only): That he or she is in a hospital, Staff names and faces, Current season  Medical Problem List and Plan: 1. Functional deficits secondary to acute encephalopathy due to urosepsis with prior history of CVA (right pons, basal ganglia infarcts)-i  -team conference today 2.  DVT Prophylaxis/Anticoagulation: Subcutaneous heparin indicated. Monitor platelet counts and any signs of bleeding 3. Pain Management: Tylenol as needed 4. Dysphagia. Dysphagia #2 thin liquids. Continued daily clinicaly reassessment               -advance per SLP             -encourage adequate po 5. Neuropsych: This patient is capable of making decisions on her own behalf. 6.  Skin/Wound Care: Routine skin checks 7. Fluids/Electrolytes/Nutrition: I personally reviewed the patient's labs today.   -hypokalemia, repleting, re-check labs with improvement 8. Acute on chronic renal insufficiency. Latest creatinine 3.0       -all labs reviewed again in person today. Cr continues to drift downward             -encourage po fluids 9. Hypertension. bp's have been poorly controlled.Improving 154/90 this a.m.             -utilize prn clonidine for bp> 200/100 10. Hyperlipidemia. Lipitor 11. Diabetes mellitus and peripheral neuropathy. Hemoglobin A1c 6.3. Liberalize diet.  -will follow for pattern at present  -lantus 23 units qhs---cbg's higher in pm 12. Klebsiella/pseudomonas Urosepsis:  Continue oral cipro 13. Watery stools have resolved.---observation only for now 14. Insomnia: low-dose trazodone appears to be effective LOS (Days) 4 A FACE TO FACE EVALUATION WAS PERFORMED  Makailah Slavick T 08/10/2015, 9:35 AM

## 2015-08-10 NOTE — Progress Notes (Signed)
Speech Language Pathology Daily Session Note  Patient Details  Name: Angelica Beck MRN: [id number] Date of Birth: 1951-01-26  Today's Date: 08/10/2015 SLP Individual Time: 1300-1400 SLP Individual Time Calculation (min): 60 min  Short Term Goals: Week 1: SLP Short Term Goal 1 (Week 1): Pt will demonstrate adequate oral manipulation of Dys 3 textures across two consecutive sessions to demonstrate readiness for diet advancement SLP Short Term Goal 2 (Week 1): Pt will utilize speech intelligibility strategies within structured tasks at the sentence level with Min cues SLP Short Term Goal 3 (Week 1): Pt will utilize external memory aids to facilitate recall of new and daily information with Min cues  Skilled Therapeutic Interventions: Skilled treatment session focused on dysphagia and cognitive goals. SLP facilitated session by providing skilled observation with lunch meal of Dys. 3 textures with thin liquids.  Patient demonstrated mildly prolonged but efficient mastication without overt s/s of aspiration and required Min A for use of small bites.  Recommend patient upgrade to Dys. 3 textures with full supervision.  Patient requested to use the bathroom but was incontinent of urine and required extra time and Min A verbal cues for safety with transfer. Patient also required Mod A question cues for recall of rules to a familiar task and initially required Mod A verbal cues for problem solving which faded to Min by end of task. SLP also provided Min A verbal cues for use of speech intelligibility strategies at the sentence level to achieve 90% intelligibility. Patient left sitting upright in wheelchair with all needs within reach. Continue with current plan of care.    Function:  Eating Eating   Modified Consistency Diet: Yes Eating Assist Level: Supervision or verbal cues;Set up assist for   Eating Set Up Assist For: Opening containers       Cognition Comprehension Comprehension assist  level: Understands basic 50 - 74% of the time/ requires cueing 25 - 49% of the time  Expression   Expression assist level: Expresses basic 75 - 89% of the time/requires cueing 10 - 24% of the time. Needs helper to occlude trach/needs to repeat words.  Social Interaction Social Interaction assist level: Interacts appropriately 75 - 89% of the time - Needs redirection for appropriate language or to initiate interaction.  Problem Solving Problem solving assist level: Solves basic 50 - 74% of the time/requires cueing 25 - 49% of the time  Memory Memory assist level: Recognizes or recalls 50 - 74% of the time/requires cueing 25 - 49% of the time    Pain Pain Assessment Pain Assessment: No/denies pain  Therapy/Group: Individual Therapy  Kron Everton 08/10/2015, 2:32 PM

## 2015-08-10 NOTE — Progress Notes (Signed)
Physical Therapy Session Note  Patient Details  Name: Angelica Beck MRN: [id number] Date of Birth: [birth year]  Today's Date: 08/10/2015 PT Individual Time: 1003-1103 PT Individual Time Calculation (min): 60 min   Short Term Goals: Week 1:  PT Short Term Goal 1 (Week 1): pt will move supine> sit using controls of hospital bed with supervision PT Short Term Goal 2 (Week 1): pt will transfer bed>< w/c with min assist PT Short Term Goal 4 (Week 1): pt will propel w/c x 150' with supervision PT Short Term Goal 5 (Week 1): pt will perform gait x 30' with assistance of 1 person  Skilled Therapeutic Interventions/Progress Updates:   Pt received in w/c, voicing need to use rest room.  Total assist for w/c navigation into bathroom and mod assist for toilet transfer, steady assist for clothing adjustment and peri-care.  Pt again requiring LB redressing due to urge incontinence and soiling pants, total assist for time management.  PT instructed patient in w/c propulsion with BLEs, but pt demos poor foot clearance on LLE; PT provided L leg rest and instructed patient in propulsion with BUEs and RLE, with improved efficiency, but pt only able to propel w/c x20' before becoming too fatigued to continue.  In therapy gym patient performed stand pivot transfer w/c<>Nustep. Pt performed Nustep x8 min at level 3 for NMR, reciprocal movement, and activity tolerance.  PT instructed patient in gait in // bars x16' with step ladder placed to encourage bilat foot clearance during swing phase.  Steady assist for gait with verbal cues for increased step length and foot placement.  Pt returned to room in w/c at end of session total assist for time management and positioned with call bell in reach and needs met.    Therapy Documentation Precautions:  Precautions Precautions: Fall Restrictions Weight Bearing Restrictions: No Pain: Pain Assessment Pain Assessment: No/denies pain   See Function Navigator for Current  Functional Status.   Therapy/Group: Individual Therapy  Angelica Beck 08/10/2015, 12:22 PM

## 2015-08-11 ENCOUNTER — Inpatient Hospital Stay (HOSPITAL_COMMUNITY): Payer: Medicare Other | Admitting: Physical Therapy

## 2015-08-11 ENCOUNTER — Inpatient Hospital Stay (HOSPITAL_COMMUNITY): Payer: Medicare Other | Admitting: Speech Pathology

## 2015-08-11 ENCOUNTER — Inpatient Hospital Stay (HOSPITAL_COMMUNITY): Payer: Medicare Other

## 2015-08-11 ENCOUNTER — Inpatient Hospital Stay (HOSPITAL_COMMUNITY): Payer: Medicare Other | Admitting: Occupational Therapy

## 2015-08-11 LAB — BASIC METABOLIC PANEL
Anion gap: 11 (ref 5–15)
BUN: 11 mg/dL (ref 6–20)
CO2: 22 mmol/L (ref 22–32)
CREATININE: 2.79 mg/dL — AB (ref 0.44–1.00)
Calcium: 9.5 mg/dL (ref 8.9–10.3)
Chloride: 106 mmol/L (ref 101–111)
GFR, EST AFRICAN AMERICAN: 20 mL/min — AB (ref >60)
GFR, EST NON AFRICAN AMERICAN: 17 mL/min — AB (ref >60)
Glucose, Bld: 166 mg/dL — ABNORMAL HIGH (ref 65–99)
Potassium: 4.4 mmol/L (ref 3.5–5.1)
SODIUM: 139 mmol/L (ref 135–145)

## 2015-08-11 LAB — GLUCOSE, CAPILLARY
Glucose-Capillary: 157 mg/dL — ABNORMAL HIGH (ref 65–99)
Glucose-Capillary: 262 mg/dL — ABNORMAL HIGH (ref 65–99)
Glucose-Capillary: 285 mg/dL — ABNORMAL HIGH (ref 65–99)
Glucose-Capillary: 161 mg/dL — ABNORMAL HIGH (ref 65–99)

## 2015-08-11 MED ORDER — INSULIN GLARGINE 100 UNIT/ML ~~LOC~~ SOLN
5.0000 [IU] | Freq: Every day | SUBCUTANEOUS | Status: DC
  Administered 2015-08-12 – 2015-08-13 (×2): 5 [IU] via SUBCUTANEOUS
  Filled 2015-08-11 (×2): qty 0.05

## 2015-08-11 MED ORDER — INSULIN ASPART 100 UNIT/ML ~~LOC~~ SOLN
0.0000 [IU] | Freq: Three times a day (TID) | SUBCUTANEOUS | Status: DC
  Administered 2015-08-11: 12 [IU] via SUBCUTANEOUS
  Administered 2015-08-11: 4 [IU] via SUBCUTANEOUS
  Administered 2015-08-11: 12 [IU] via SUBCUTANEOUS
  Administered 2015-08-12 (×2): 4 [IU] via SUBCUTANEOUS
  Administered 2015-08-12: 8 [IU] via SUBCUTANEOUS
  Administered 2015-08-13 (×3): 4 [IU] via SUBCUTANEOUS
  Administered 2015-08-14: 12 [IU] via SUBCUTANEOUS
  Administered 2015-08-14 (×2): 4 [IU] via SUBCUTANEOUS
  Administered 2015-08-15: 2 [IU] via SUBCUTANEOUS
  Administered 2015-08-15: 12 [IU] via SUBCUTANEOUS
  Administered 2015-08-15: 4 [IU] via SUBCUTANEOUS
  Administered 2015-08-15 – 2015-08-16 (×2): 8 [IU] via SUBCUTANEOUS
  Administered 2015-08-16: 12 [IU] via SUBCUTANEOUS
  Administered 2015-08-17 (×2): 4 [IU] via SUBCUTANEOUS
  Administered 2015-08-17: 2 [IU] via SUBCUTANEOUS
  Administered 2015-08-18 (×3): 4 [IU] via SUBCUTANEOUS
  Administered 2015-08-19 – 2015-08-20 (×7): 2 [IU] via SUBCUTANEOUS
  Administered 2015-08-21 (×2): 8 [IU] via SUBCUTANEOUS
  Administered 2015-08-21: 4 [IU] via SUBCUTANEOUS
  Administered 2015-08-22: 8 [IU] via SUBCUTANEOUS
  Administered 2015-08-22 – 2015-08-23 (×2): 4 [IU] via SUBCUTANEOUS
  Administered 2015-08-23 – 2015-08-24 (×2): 2 [IU] via SUBCUTANEOUS

## 2015-08-11 MED ORDER — METOPROLOL SUCCINATE ER 50 MG PO TB24
75.0000 mg | ORAL_TABLET | Freq: Every day | ORAL | Status: DC
  Administered 2015-08-12 – 2015-08-24 (×13): 75 mg via ORAL
  Filled 2015-08-11 (×13): qty 1

## 2015-08-11 MED ORDER — INSULIN ASPART 100 UNIT/ML ~~LOC~~ SOLN: 0.0000 [IU] | SUBCUTANEOUS | Status: DC

## 2015-08-11 NOTE — Progress Notes (Signed)
Occupational Therapy Session Note  Patient Details  Name: Angelica Beck MRN: [id number] Date of Birth: [birth year]  Today's Date: 08/11/2015 OT Individual Time: 5909-3112 OT Individual Time Calculation (min): 30 min    Short Term Goals: Week 1:  OT Short Term Goal 1 (Week 1): Pt will complete toilet transfer with min assist  OT Short Term Goal 2 (Week 1): Pt will complete therapeutic activity in standing for 2 minutes with min assist for balance OT Short Term Goal 3 (Week 1): Pt will complete LB dressing with max assist OT Short Term Goal 4 (Week 1): Pt will complete toileting task with max assist  Skilled Therapeutic Interventions/Progress Updates:    Treatment session with focus on sit > stand, standing balance, and overall activity tolerance.  Pt reporting she had a rough night and was not feeling well during AM sessions, but is feeling better this afternoon.  Agreeable to therapy session.  Addressed sit <> stand from therapy mat with focus on anterior weight shift and motor control, able to lower mat to increase challenge with sit > stand with pt able to complete progressing from mod assist to min assist with repetition and tactile cue at posterior hip for weight shift.  Engaged in table top activity during standing with pt able to tolerate standing for approx 1 minute this session with contact guard to min assist for standing balance.   Therapy Documentation Precautions:  Precautions Precautions: Fall Restrictions Weight Bearing Restrictions: No General:   Vital Signs: Therapy Vitals Temp: 98.1 F (36.7 C) Temp Source: Oral Pulse Rate: (!) 104 Resp: 20 BP: 124/69 mmHg Patient Position (if appropriate): Sitting Oxygen Therapy SpO2: 100 % O2 Device: Not Delivered Pain:  Pt with no c/o pain  See Function Navigator for Current Functional Status.   Therapy/Group: Individual Therapy  Simonne Come 08/11/2015, 3:23 PM

## 2015-08-11 NOTE — Progress Notes (Addendum)
Subjective/Complaints: Pt with nausea and vomiting last night. Fatigued this morning as a result  ROS: Pt denies fever, rash/itching, headache, blurred or double vision,  chest pain, shortness of breath, palpitations, dysuria, dizziness, neck or back pain, bleeding, anxiety, or depression  Objective: Vital Signs: Blood pressure 156/88, pulse 103, temperature 98.2 F (36.8 C), temperature source Oral, resp. rate 18, height $RemoveBe'5\' 7"'[id number]$  (1.702 m), weight 98.975 kg (218 lb 3.2 oz), SpO2 98 %. No results found. Results for orders placed or performed during the hospital encounter of 08/06/15 (from the past 72 hour(s))  Glucose, capillary     Status: Abnormal   Collection Time: 08/08/15  4:54 PM  Result Value Ref Range   Glucose-Capillary 218 (H) 65 - 99 mg/dL  Glucose, capillary     Status: Abnormal   Collection Time: 08/08/15  9:00 PM  Result Value Ref Range   Glucose-Capillary 188 (H) 65 - 99 mg/dL  Glucose, capillary     Status: Abnormal   Collection Time: 08/09/15  7:07 AM  Result Value Ref Range   Glucose-Capillary 121 (H) 65 - 99 mg/dL   Comment 1 Notify RN   CBC WITH DIFFERENTIAL     Status: Abnormal   Collection Time: 08/09/15  7:09 AM  Result Value Ref Range   WBC 10.3 4.0 - 10.5 K/uL   RBC 3.78 (L) 3.87 - 5.11 MIL/uL   Hemoglobin 9.4 (L) 12.0 - 15.0 g/dL   HCT 29.0 (L) 36.0 - 46.0 %   MCV 76.7 (L) 78.0 - 100.0 fL   MCH 24.9 (L) 26.0 - 34.0 pg   MCHC 32.4 30.0 - 36.0 g/dL   RDW 19.4 (H) 11.5 - 15.5 %   Platelets 196 150 - 400 K/uL   Neutrophils Relative % 62 %   Neutro Abs 6.3 1.7 - 7.7 K/uL   Lymphocytes Relative 19 %   Lymphs Abs 2.0 0.7 - 4.0 K/uL   Monocytes Relative 12 %   Monocytes Absolute 1.3 (H) 0.1 - 1.0 K/uL   Eosinophils Relative 7 %   Eosinophils Absolute 0.7 0.0 - 0.7 K/uL   Basophils Relative 0 %   Basophils Absolute 0.0 0.0 - 0.1 K/uL  Comprehensive metabolic panel     Status: Abnormal   Collection Time: 08/09/15  7:09 AM  Result Value Ref Range    Sodium 139 135 - 145 mmol/L   Potassium 2.7 (LL) 3.5 - 5.1 mmol/L    Comment: CRITICAL RESULT CALLED TO, READ BACK BY AND VERIFIED WITH: JENNINGS,S RN 11.14.16 [id number]    Chloride 107 101 - 111 mmol/L   CO2 23 22 - 32 mmol/L   Glucose, Bld 134 (H) 65 - 99 mg/dL   BUN 14 6 - 20 mg/dL   Creatinine, Ser 3.00 (H) 0.44 - 1.00 mg/dL   Calcium 9.0 8.9 - 10.3 mg/dL   Total Protein 6.5 6.5 - 8.1 g/dL   Albumin 2.7 (L) 3.5 - 5.0 g/dL   AST 27 15 - 41 U/L   ALT 12 (L) 14 - 54 U/L   Alkaline Phosphatase 49 38 - 126 U/L   Total Bilirubin 0.5 0.3 - 1.2 mg/dL   GFR calc non Af Amer 16 (L) >60 mL/min   GFR calc Af Amer 18 (L) >60 mL/min    Comment: (NOTE) The eGFR has been calculated using the CKD EPI equation. This calculation has not been validated in all clinical situations. eGFR's persistently <60 mL/min signify possible Chronic Kidney Disease.    Anion  gap 9 5 - 15  Glucose, capillary     Status: Abnormal   Collection Time: 08/09/15 11:19 AM  Result Value Ref Range   Glucose-Capillary 174 (H) 65 - 99 mg/dL  Basic metabolic panel     Status: Abnormal   Collection Time: 08/09/15  2:53 PM  Result Value Ref Range   Sodium 139 135 - 145 mmol/L   Potassium 4.1 3.5 - 5.1 mmol/L    Comment: NO VISIBLE HEMOLYSIS   Chloride 109 101 - 111 mmol/L   CO2 20 (L) 22 - 32 mmol/L   Glucose, Bld 209 (H) 65 - 99 mg/dL   BUN 13 6 - 20 mg/dL   Creatinine, Ser 2.81 (H) 0.44 - 1.00 mg/dL   Calcium 9.4 8.9 - 10.3 mg/dL   GFR calc non Af Amer 17 (L) >60 mL/min   GFR calc Af Amer 20 (L) >60 mL/min    Comment: (NOTE) The eGFR has been calculated using the CKD EPI equation. This calculation has not been validated in all clinical situations. eGFR's persistently <60 mL/min signify possible Chronic Kidney Disease.    Anion gap 10 5 - 15  Glucose, capillary     Status: Abnormal   Collection Time: 08/09/15  4:42 PM  Result Value Ref Range   Glucose-Capillary 148 (H) 65 - 99 mg/dL  Glucose, capillary      Status: Abnormal   Collection Time: 08/09/15  8:41 PM  Result Value Ref Range   Glucose-Capillary 176 (H) 65 - 99 mg/dL   Comment 1 Notify RN   Basic metabolic panel     Status: Abnormal   Collection Time: 08/10/15  4:51 AM  Result Value Ref Range   Sodium 138 135 - 145 mmol/L   Potassium 3.4 (L) 3.5 - 5.1 mmol/L    Comment: DELTA CHECK NOTED   Chloride 107 101 - 111 mmol/L   CO2 25 22 - 32 mmol/L   Glucose, Bld 99 65 - 99 mg/dL   BUN 11 6 - 20 mg/dL   Creatinine, Ser 2.85 (H) 0.44 - 1.00 mg/dL   Calcium 9.2 8.9 - 10.3 mg/dL   GFR calc non Af Amer 17 (L) >60 mL/min   GFR calc Af Amer 19 (L) >60 mL/min    Comment: (NOTE) The eGFR has been calculated using the CKD EPI equation. This calculation has not been validated in all clinical situations. eGFR's persistently <60 mL/min signify possible Chronic Kidney Disease.    Anion gap 6 5 - 15  Glucose, capillary     Status: None   Collection Time: 08/10/15  6:40 AM  Result Value Ref Range   Glucose-Capillary 95 65 - 99 mg/dL   Comment 1 Notify RN   Glucose, capillary     Status: Abnormal   Collection Time: 08/10/15 11:23 AM  Result Value Ref Range   Glucose-Capillary 149 (H) 65 - 99 mg/dL  Glucose, capillary     Status: Abnormal   Collection Time: 08/10/15  4:17 PM  Result Value Ref Range   Glucose-Capillary 187 (H) 65 - 99 mg/dL  Glucose, capillary     Status: Abnormal   Collection Time: 08/10/15  8:22 PM  Result Value Ref Range   Glucose-Capillary 265 (H) 65 - 99 mg/dL  Basic metabolic panel     Status: Abnormal   Collection Time: 08/11/15  5:55 AM  Result Value Ref Range   Sodium 139 135 - 145 mmol/L   Potassium 4.4 3.5 - 5.1 mmol/L  Chloride 106 101 - 111 mmol/L   CO2 22 22 - 32 mmol/L   Glucose, Bld 166 (H) 65 - 99 mg/dL   BUN 11 6 - 20 mg/dL   Creatinine, Ser 2.79 (H) 0.44 - 1.00 mg/dL   Calcium 9.5 8.9 - 10.3 mg/dL   GFR calc non Af Amer 17 (L) >60 mL/min   GFR calc Af Amer 20 (L) >60 mL/min    Comment:  (NOTE) The eGFR has been calculated using the CKD EPI equation. This calculation has not been validated in all clinical situations. eGFR's persistently <60 mL/min signify possible Chronic Kidney Disease.    Anion gap 11 5 - 15  Glucose, capillary     Status: Abnormal   Collection Time: 08/11/15  6:47 AM  Result Value Ref Range   Glucose-Capillary 157 (H) 65 - 99 mg/dL  Glucose, capillary     Status: Abnormal   Collection Time: 08/11/15 11:16 AM  Result Value Ref Range   Glucose-Capillary 262 (H) 65 - 99 mg/dL     HEENT: edentulous Cardio: RRR Resp: CTA B/L and unlabored GI: BS positive and NT, ND Extremity:  No Edema Skin:   Intact Neuro: Lethargic, Flat and Abnormal Motor 4- BUE and 3- BLE Musc/Skel:  Other no pain with UE or LE ROM Gen NAD   Assessment/Plan:  1. Functional deficits secondary to acute encephalopathy which require 3+ hours per day of interdisciplinary therapy in a comprehensive inpatient rehab setting. Physiatrist is providing close team supervision and 24 hour management of active medical problems listed below. Physiatrist and rehab team continue to assess barriers to discharge/monitor patient progress toward functional and medical goals. FIM: Function - Bathing Position: Wheelchair/chair at sink Body parts bathed by patient: Right arm, Left arm, Chest, Abdomen, Front perineal area, Right upper leg, Left upper leg Body parts bathed by helper: Buttocks, Right lower leg, Left lower leg, Back Assist Level:  (mod A)  Function- Upper Body Dressing/Undressing What is the patient wearing?: Bra, Pull over shirt/dress Bra - Perfomed by patient: Thread/unthread right bra strap, Thread/unthread left bra strap Bra - Perfomed by helper: Hook/unhook bra (pull down sports bra) Pull over shirt/dress - Perfomed by patient: Thread/unthread right sleeve, Thread/unthread left sleeve, Put head through opening Pull over shirt/dress - Perfomed by helper: Pull shirt over  trunk Assist Level: Touching or steadying assistance(Pt > 75%) Function - Lower Body Dressing/Undressing What is the patient wearing?: Underwear, Pants, Socks, Shoes Position: Wheelchair/chair at [employer] - Performed by patient: Thread/unthread right underwear leg, Thread/unthread left underwear leg Underwear - Performed by helper: Pull underwear up/down Pants- Performed by helper: Thread/unthread right pants leg, Thread/unthread left pants leg, Pull pants up/down, Fasten/unfasten pants Non-skid slipper socks- Performed by helper: Don/doff right sock, Don/doff left sock Socks - Performed by helper: Don/doff right sock, Don/doff left sock Shoes - Performed by helper: Don/doff right shoe, Don/doff left shoe, Fasten right, Fasten left Assist for footwear: Dependant Assist for lower body dressing:  (total A)  Function - Toileting Toileting activity did not occur: N/A (voiced no need) Toileting steps completed by patient: Adjust clothing prior to toileting, Performs perineal hygiene Toileting steps completed by helper: Adjust clothing after toileting Toileting Assistive Devices: Grab bar or rail Assist level: Touching or steadying assistance (Pt.75%)  Function - Air cabin crew transfer activity did not occur: N/A (voiced no need) Toilet transfer assistive device: Elevated toilet seat/BSC over toilet, Grab bar Assist level to toilet: Moderate assist (Pt 50 - 74%/lift or lower) Assist  level from toilet: Moderate assist (Pt 50 - 74%/lift or lower)  Function - Chair/bed transfer Chair/bed transfer method: Stand pivot Chair/bed transfer assist level: Moderate assist (Pt 50 - 74%/lift or lower) Chair/bed transfer assistive device: Armrests, Walker Chair/bed transfer details: Verbal cues for technique, Verbal cues for sequencing, Verbal cues for precautions/safety  Function - Locomotion: Wheelchair Will patient use wheelchair at discharge?: Yes Type: Manual Max wheelchair  distance: 25 Assist Level: Supervision or verbal cues Turns around,maneuvers to table,bed, and toilet,negotiates 3% grade,maneuvers on rugs and over doorsills: No Function - Locomotion: Ambulation Ambulation activity did not occur: Safety/medical concerns (diarrhea/weakness) Assistive device: Parallel bars Max distance: 16 Assist level: Touching or steadying assistance (Pt > 75%) Assist level: Touching or steadying assistance (Pt > 75%) Walk 50 feet with 2 turns activity did not occur: Safety/medical concerns Walk 150 feet activity did not occur: Safety/medical concerns Walk 10 feet on uneven surfaces activity did not occur: Safety/medical concerns  Function - Comprehension Comprehension: Auditory Comprehension assist level: Understands basic 50 - 74% of the time/ requires cueing 25 - 49% of the time  Function - Expression Expression: Verbal Expression assist level: Expresses basic 75 - 89% of the time/requires cueing 10 - 24% of the time. Needs helper to occlude trach/needs to repeat words.  Function - Social Interaction Social Interaction assist level: Interacts appropriately 75 - 89% of the time - Needs redirection for appropriate language or to initiate interaction.  Function - Problem Solving Problem solving assist level: Solves basic 50 - 74% of the time/requires cueing 25 - 49% of the time  Function - Memory Memory assist level: Recognizes or recalls 50 - 74% of the time/requires cueing 25 - 49% of the time Patient normally able to recall (first 3 days only): That he or she is in a hospital, Staff names and faces, Current season  Medical Problem List and Plan: 1. Functional deficits secondary to acute encephalopathy due to urosepsis with prior history of CVA (right pons, basal ganglia infarcts)-i  -team continues with positive reinforcement 2.  DVT Prophylaxis/Anticoagulation: Subcutaneous heparin indicated. Monitor platelet counts and any signs of bleeding 3. Pain  Management: Tylenol as needed 4. Dysphagia. Dysphagia #2 thin liquids. Continued daily clinicaly reassessment               -advance per SLP             -encourage adequate po 5. Neuropsych: This patient is capable of making decisions on her own behalf. 6. Skin/Wound Care: Routine skin checks 7. Fluids/Electrolytes/Nutrition: I personally reviewed the patient's labs today.   -hypokalemia, repleting, re-check labs ok this am.  8. Acute on chronic renal insufficiency. Latest creatinine 3.0       -all labs reviewed again in person today. Cr continues to drift downward             -encourage po fluids 9. Hypertension. bp's have been poorly controlled.Improving 154/90 this a.m.  -increase metoprolol to $RemoveBefor'75mg'TAyuANqJtaWR$  xr             -utilize prn clonidine for bp> 200/100 10. Hyperlipidemia. Lipitor 11. Diabetes mellitus and peripheral neuropathy. Hemoglobin A1c 6.3. Liberalize diet.  -will follow for pattern at present  -lantus 23 units qhs---cbg's higher in pm  -will add 5u in am 12. Klebsiella/pseudomonas Urosepsis:  Continue oral cipro  -if n/v persist, may stop 13. Watery stools have resolved.---observation only for now  -if n/v persist, consider KUB 14. Insomnia: low-dose trazodone appears to be effective LOS (Days) 5  A FACE TO FACE EVALUATION WAS PERFORMED  Zanden Colver T 08/11/2015, 12:58 PM

## 2015-08-11 NOTE — Progress Notes (Signed)
Social Work Patient ID: Angelica Beck, female   DOB: Sep 08, 1951, 64 y.o.   MRN: [id number]   Angelica Curb, LCSW Social Worker Signed Physical Medicine and Rehabilitation Patient Care Conference 08/11/2015  9:45 AM    Expand All Collapse All   Inpatient RehabilitationTeam Conference and Plan of Care Update Date: 08/10/2015   Time: 2:40 PM     Patient Name: Angelica Beck       Medical Record Number: [id number]  Date of Birth: June 27, 1951 Sex: Female         Room/Bed: 4W13C/4W13C-01 Payor Info: Payor: MEDICARE / Plan: MEDICARE PART A AND B / Product Type: *No Product type* /    Admitting Diagnosis: ENCEPHALOPATHY SEPSIS   Admit Date/Time:  08/06/2015  4:44 PM Admission Comments: No comment available   Primary Diagnosis:  Acute encephalopathy Principal Problem: Acute encephalopathy    Patient Active Problem List     Diagnosis  Date Noted   .  Bacterial UTI  08/06/2015   .  Sepsis ([place])  08/06/2015   .  Altered mental status     .  Lactic acidosis  08/02/2015   .  Acute encephalopathy  08/02/2015   .  History of CVA (cerebrovascular accident)  08/02/2015   .  Chronic systolic HF (heart failure) ([place])  08/02/2015   .  Cognitive impairment  08/02/2015   .  AKI (acute kidney injury) ([place])  02/08/2015   .  Abnormality of gait  02/08/2015   .  Hyperthyroidism  02/08/2015   .  Type 2 diabetes mellitus ([place])  02/08/2015   .  Chest pain  01/19/2014   .  Obesity hypoventilation syndrome ([place])  06/20/2013   .  HYPERTHYROIDISM  06/28/2009   .  DIAB W/UNS COMP TYPE II/UNS NOT STATED UNCNTRL  06/28/2009   .  HYPERLIPIDEMIA-MIXED  06/28/2009   .  OVERWEIGHT/OBESITY  06/28/2009   .  HYPERTENSION, BENIGN  06/28/2009   .  CAD, NATIVE VESSEL  06/28/2009   .  DYSPNEA  06/28/2009     Expected Discharge Date: Expected Discharge Date: 08/24/15  Team Members Present: Physician leading conference: Dr. Alger Simons Social Worker Present: Lennart Pall, LCSW Nurse Present: Heather Roberts, RN PT  Present: Dwyane Dee, PT;Rebecca Varner, Dustin Folks, PT OT Present: Roanna Epley, [place], OT SLP Present: Weston Anna, SLP PPS Coordinator present : Daiva Nakayama, RN, CRRN        Current Status/Progress  Goal  Weekly Team Focus   Medical     enephalopathy due to urosepsis. 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 incontinent  improve mobility, safety  bowel and bladder continence, nutrition, bp control    Bowel/Bladder     Continent on bladder during day, incontinent at night; calls appropriately. Continent of bowel; LBM 08/08/15  Mod I  Timed toileting q 2hr.    Swallow/Nutrition/ Hydration     Dys. 3 textures with thin liquids, Min A for use of swallowing strategies   Mod I  tolerance of current diet, use of swallowing strategies    ADL's     set up A-grooming,min A UB, Mod A bathing and toilet transfer, max A toileting, total A for LB dressing  supervision - mod A  self care retraining, functional transfers, pt/family education, balance   Mobility     mod/max assist overall (transfers, w/c, and gait)   supervision overall, short distance amb   w/c mobility, transfers, gait, balance, activity tolerance    Communication     Min A  Min A   increased speech intelligibility    Safety/Cognition/ Behavioral Observations    Mod A   Min A   recall of new information    Pain     No c/o pain  <3 on a 0-10 scale  assess pain q 4hr and pain, assess for non-verbal cues of pain    Skin     MASD to abdominal folds, MGP and barrier cream applied   no new skin breakdown while on rehab   assess skin q shift and prn    Rehab Goals Patient on target to meet rehab goals: Yes *See Care Plan and progress notes for long and short-term goals.    Barriers to Discharge:  baseline deficits, safety awareness      Possible Resolutions to Barriers:   ongoing NMR, adpative equipment training, caregiver ed     Discharge Planning/Teaching Needs:   Pt pt and family, their goal is to have pt return home  with family and CAP services available.  Concern that pt is alone at home for approx 4 hours and likely that team will recommend 24/7 care.   Teaching ongoing.    Team Discussion:    Prior CVA issues exacerbated due to urosepsis.  Safety awareness appears to be improving along with cognitive improvement. Incontinence with bladder.  Tolerating D3 diet.  Supervision goals and min/ mod with ADLs.  SW to follow up with family about brief period pt is alone at home.  Team feels she can reach a level where this can be safe.   Revisions to Treatment Plan:    NA    Continued Need for Acute Rehabilitation Level of Care: The patient requires daily medical management by a physician with specialized training in physical medicine and rehabilitation for the following conditions: Daily direction of a multidisciplinary physical rehabilitation program to ensure safe treatment while eliciting the highest outcome that is of practical value to the patient.: Yes Daily medical management of patient stability for increased activity during participation in an intensive rehabilitation regime.: Yes Daily analysis of laboratory values and/or radiology reports with any subsequent need for medication adjustment of medical intervention for : Neurological problems;Other  Odette Watanabe, [place] 08/11/2015, 9:45 AM

## 2015-08-11 NOTE — Progress Notes (Signed)
Social Work Patient ID: Angelica Beck, female   DOB: Mar 01, 1951, 64 y.o.   MRN: [id number]   Have reviewed team conference with pt and daughter (via phone).  They are aware and agreeable with targeted d/c for 11/29 and supervision/ min goals.  Daughter aware that team recommends 24/7, however, she and pt note pt will be alone for 3-4 hours in the morning and they are comfortable with that.  Daughter reports that she stays in touch with pt via phone during that time until CAP aide arrives at 10am.  They fully intend to return home with this plan.  Will continue to follow.  Teagon Kron, LCSW

## 2015-08-11 NOTE — Progress Notes (Signed)
Occupational Therapy Session Note  Patient Details  Name: Coriana Angello MRN: [id number] Date of Birth: [birth year]  Today's Date: 08/11/2015 OT Individual Time: 0800-0900 OT Individual Time Calculation (min): 60 min    Short Term Goals: Week 1:  OT Short Term Goal 1 (Week 1): Pt will complete toilet transfer with min assist  OT Short Term Goal 2 (Week 1): Pt will complete therapeutic activity in standing for 2 minutes with min assist for balance OT Short Term Goal 3 (Week 1): Pt will complete LB dressing with max assist OT Short Term Goal 4 (Week 1): Pt will complete toileting task with max assist  Skilled Therapeutic Interventions/Progress Updates:   Pt resting in bed upon arrival and agreeable to therapy.  Pt stated she had a "rough" night and was "throwing up".  Pt also stated she had a bowel movement during the night.  Focus on BADL retraining including bathing and dressing with sit<>stand from w/c at sink.  Pt required increased time this morning to complete tasks and increased assistance to initiate, sequence, and complete tasks.  Pt required multiple rest breaks during this session.  Pt required mod A for sit<>stand and steady A for standing balance during LB bathing and dressing tasks.  Pt noted with increased difficulty maintaining upright posture when standing.  Focus on activity tolerance, sit<>stand, standing balance, task initiation, sequencing, and safety awareness to increase independence with BADLs and decrease burden of care.    Therapy Documentation Precautions:  Precautions Precautions: Fall Restrictions Weight Bearing Restrictions: No  Pain: Pain Assessment Pain Assessment: No/denies pain  See Function Navigator for Current Functional Status.   Therapy/Group: Individual Therapy  Leroy Libman 08/11/2015, 9:03 AM

## 2015-08-11 NOTE — Progress Notes (Signed)
Physical Therapy Session Note  Patient Details  Name: Angelica Beck MRN: [id number] Date of Birth: 12/02/1950  Today's Date: 08/11/2015 PT Individual Time: 0902-1000 PT Individual Time Calculation (min): 58 min   Short Term Goals: Week 1:  PT Short Term Goal 1 (Week 1): pt will move supine> sit using controls of hospital bed with supervision PT Short Term Goal 2 (Week 1): pt will transfer bed>< w/c with min assist PT Short Term Goal 4 (Week 1): pt will propel w/c x 150' with supervision PT Short Term Goal 5 (Week 1): pt will perform gait x 30' with assistance of 1 person  Skilled Therapeutic Interventions/Progress Updates:    Pt received resting in w/c, reporting nausea/vomitting overnight, but agreeable to therapy.  Session focused on day-to-day recall, functional transfers, w/c propulsion, activity tolerance, and NMR. Pt propelled w/c x25 feet using BUEs and RLE with noted improvement in efficiency and decreased amount of time required.  PT instructed pt in car transfer x1 with RW and mod assist for lifting assist and to lift BLEs into car, verbal cues for sequencing, hand placement, and walker positioning.  Pt became dizzy in car with decreased ability to follow directions x1 minute, vitals assessed and WNL (see vitals flowsheet), and pt returned to baseline.  Pt returned to w/c mod assist with RW and PT propelled pt to therapy gym.  PT instructed patient in kinetron x7 min at 40 cm/s while seated in w/c for NMR, strengthening, and overall endurance.  Pt returned to room at end of session, hand off to SLP.    Therapy Documentation Precautions:  Precautions Precautions: Fall Restrictions Weight Bearing Restrictions: No Pain: Pain Assessment Pain Assessment: No/denies pain   See Function Navigator for Current Functional Status.   Therapy/Group: Individual Therapy  Earnest Conroy [place] 08/11/2015, 9:56 AM

## 2015-08-11 NOTE — Progress Notes (Signed)
Speech Language Pathology Daily Session Notes   Patient Details  Name: Angelica Beck MRN: [id number] Date of Birth: 1951-05-19  Today's Date: 08/11/2015  Session 1: SLP Individual Time: 1000-1045 SLP Individual Time Calculation (min): 45 min   Session 2: SLP Individual Time: 9396-8864 SLP Individual Time Calculation (min): 20 min   Short Term Goals: Week 1: SLP Short Term Goal 1 (Week 1): Pt will demonstrate adequate oral manipulation of Dys 3 textures across two consecutive sessions to demonstrate readiness for diet advancement SLP Short Term Goal 2 (Week 1): Pt will utilize speech intelligibility strategies within structured tasks at the sentence level with Min cues SLP Short Term Goal 3 (Week 1): Pt will utilize external memory aids to facilitate recall of new and daily information with Min cues  Skilled Therapeutic Interventions:  Session 1: Skilled treatment session focused on speech and cognitive goals. SLP facilitated session by providing Min A verbal cues for use of speech intelligibility strategies at the sentence level during a verbal description task.  Patient independently requested to use the bathroom and required Mod A verbal cues for problem solving and safety with task. Patient also required Min A question cues to recall events from the previous therapy sessions. Patient left upright in wheelchair with all needs within reach. Continue with current plan of care.    Session 2: Skilled treatment session focused on dysphagia goals. SLP provided tray set-up in regards to opening containers. SLP also provided skilled observation with lunch meal of Dys. 3 textures with thin liquids. Patient did not demonstrate any overt s/s of aspiration and required supervision verbal cues for use of small bites. Recommend patient continue current diet.   Function:  Eating Eating   Modified Consistency Diet: Yes Eating Assist Level: Supervision or verbal cues;Set up assist for   Eating Set  Up Assist For: Opening containers       Cognition Comprehension Comprehension assist level: Understands basic 50 - 74% of the time/ requires cueing 25 - 49% of the time  Expression   Expression assist level: Expresses basic 75 - 89% of the time/requires cueing 10 - 24% of the time. Needs helper to occlude trach/needs to repeat words.  Social Interaction Social Interaction assist level: Interacts appropriately 75 - 89% of the time - Needs redirection for appropriate language or to initiate interaction.  Problem Solving Problem solving assist level: Solves basic 50 - 74% of the time/requires cueing 25 - 49% of the time  Memory Memory assist level: Recognizes or recalls 50 - 74% of the time/requires cueing 25 - 49% of the time    Pain Pain Assessment Pain Assessment: No/denies pain  Therapy/Group: Individual Therapy  Basem Yannuzzi 08/11/2015, 4:06 PM

## 2015-08-11 NOTE — Patient Care Conference (Signed)
Inpatient RehabilitationTeam Conference and Plan of Care Update Date: 08/10/2015   Time: 2:40 PM    Patient Name: Angelica Beck      Medical Record Number: [id number]  Date of Birth: February 24, 1951 Sex: Female         Room/Bed: 4W13C/4W13C-01 Payor Info: Payor: MEDICARE / Plan: MEDICARE PART A AND B / Product Type: *No Product type* /    Admitting Diagnosis: ENCEPHALOPATHY SEPSIS  Admit Date/Time:  08/06/2015  4:44 PM Admission Comments: No comment available   Primary Diagnosis:  Acute encephalopathy Principal Problem: Acute encephalopathy  Patient Active Problem List   Diagnosis Date Noted  . Bacterial UTI 08/06/2015  . Sepsis ([place]) 08/06/2015  . Altered mental status   . Lactic acidosis 08/02/2015  . Acute encephalopathy 08/02/2015  . History of CVA (cerebrovascular accident) 08/02/2015  . Chronic systolic HF (heart failure) ([place]) 08/02/2015  . Cognitive impairment 08/02/2015  . AKI (acute kidney injury) ([place]) 02/08/2015  . Abnormality of gait 02/08/2015  . Hyperthyroidism 02/08/2015  . Type 2 diabetes mellitus ([place]) 02/08/2015  . Chest pain 01/19/2014  . Obesity hypoventilation syndrome ([place]) 06/20/2013  . HYPERTHYROIDISM 06/28/2009  . DIAB W/UNS COMP TYPE II/UNS NOT STATED UNCNTRL 06/28/2009  . HYPERLIPIDEMIA-MIXED 06/28/2009  . OVERWEIGHT/OBESITY 06/28/2009  . HYPERTENSION, BENIGN 06/28/2009  . CAD, NATIVE VESSEL 06/28/2009  . DYSPNEA 06/28/2009    Expected Discharge Date: Expected Discharge Date: 08/24/15  Team Members Present: Physician leading conference: Dr. 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  incontinent  improve mobility, safety  bowel and bladder continence, nutrition, bp control   Bowel/Bladder   Continent on bladder during day, incontinent at night; calls appropriately. Continent of bowel; LBM 08/08/15  Mod I  Timed toileting q 2hr.    Swallow/Nutrition/ Hydration   Dys. 3 textures with thin liquids, Min A for use of swallowing strategies   Mod I  tolerance of current diet, use of swallowing strategies    ADL's   set up A-grooming,min A UB, Mod A bathing and toilet transfer, max A toileting, total A for LB dressing  supervision - mod A  self care retraining, functional transfers, pt/family education, balance   Mobility   mod/max assist overall (transfers, w/c, and gait)  supervision overall, short distance amb  w/c mobility, transfers, gait, balance, activity tolerance   Communication   Min A  Min A   increased speech intelligibility    Safety/Cognition/ Behavioral Observations  Mod A   Min A   recall of new information    Pain   No c/o pain  <3 on a 0-10 scale  assess pain q 4hr and pain, assess for non-verbal cues of pain   Skin   MASD to abdominal folds, MGP and barrier cream applied  no new skin breakdown while on rehab  assess skin q shift and prn    Rehab Goals Patient on target to meet rehab goals: Yes *See Care Plan and progress notes for long and short-term goals.  Barriers to Discharge: baseline deficits, safety awareness    Possible Resolutions to Barriers:  ongoing NMR, adpative equipment training, caregiver ed    Discharge Planning/Teaching Needs:  Pt pt and  family, their goal is to have pt return home with family and CAP services available.  Concern that pt is alone at home for approx 4 hours and likely that team will recommend 24/7 care.  Teaching ongoing.   Team Discussion:  Prior CVA issues exacerbated due to urosepsis.  Safety awareness appears to be improving along with cognitive improvement. Incontinence with bladder.  Tolerating D3 diet.   Supervision goals and min/ mod with ADLs.  SW to follow up with family about brief period pt is alone at home.  Team feels she can reach a level where this can be safe.  Revisions to Treatment Plan:  NA   Continued Need for Acute Rehabilitation Level of Care: The patient requires daily medical management by a physician with specialized training in physical medicine and rehabilitation for the following conditions: Daily direction of a multidisciplinary physical rehabilitation program to ensure safe treatment while eliciting the highest outcome that is of practical value to the patient.: Yes Daily medical management of patient stability for increased activity during participation in an intensive rehabilitation regime.: Yes Daily analysis of laboratory values and/or radiology reports with any subsequent need for medication adjustment of medical intervention for : Neurological problems;Other  [name], [place] 08/11/2015, 9:45 AM

## 2015-08-12 ENCOUNTER — Inpatient Hospital Stay (HOSPITAL_COMMUNITY): Payer: Medicare Other | Admitting: Speech Pathology

## 2015-08-12 ENCOUNTER — Inpatient Hospital Stay (HOSPITAL_COMMUNITY): Payer: Medicare Other

## 2015-08-12 ENCOUNTER — Inpatient Hospital Stay (HOSPITAL_COMMUNITY): Payer: Medicare Other | Admitting: Physical Therapy

## 2015-08-12 LAB — GLUCOSE, CAPILLARY
GLUCOSE-CAPILLARY: 84 mg/dL (ref 65–99)
GLUCOSE-CAPILLARY: 174 mg/dL — AB (ref 65–99)
Glucose-Capillary: 177 mg/dL — ABNORMAL HIGH (ref 65–99)
Glucose-Capillary: 240 mg/dL — ABNORMAL HIGH (ref 65–99)

## 2015-08-12 NOTE — Progress Notes (Signed)
Physical Therapy Session Note  Patient Details  Name: Angelica Beck MRN: [id number] Date of Birth: [birth year]  Today's Date: 08/12/2015 PT Individual Time: 0830-0915 PT Individual Time Calculation (min): 45 min   Short Term Goals: Week 1:  PT Short Term Goal 1 (Week 1): pt will move supine> sit using controls of hospital bed with supervision PT Short Term Goal 2 (Week 1): pt will transfer bed>< w/c with min assist PT Short Term Goal 4 (Week 1): pt will propel w/c x 150' with supervision PT Short Term Goal 5 (Week 1): pt will perform gait x 30' with assistance of 1 person  Skilled Therapeutic Interventions/Progress Updates:    Patient in supine assist to EOB with HOB elevated and with rails and cues mod A.  Stand pivot to w/c mod A.  Assisted into bathroom transfer with grabbar mod A and pt A for clothing management and hygiene.  Propelled w/c to dayroom min A mod cues for initiation, technique and focus on task x 60'.  Patient sit to stand min/mod A from w/c vs armchair and gait w/ RW min/mod A for walker management and due to fatigue x 14, 17 and 30'.  Assisted back to room in w/c and left in chair all needs in reach.  Therapy Documentation Precautions:  Precautions Precautions: Fall Restrictions Weight Bearing Restrictions: No General:   Vital Signs: Therapy Vitals BP: (!) 144/75 mmHg Patient Position (if appropriate): Sitting Pain: Pain Assessment Pain Assessment: 0-10 Pain Score: 7  Pain Type: Acute pain Pain Location: Foot Pain Orientation: Left;Right Pain Descriptors / Indicators: Aching Pain Intervention(s): Ambulation/increased activity;Repositioned      See Function Navigator for Current Functional Status.   Therapy/Group: Individual Therapy  [place], [name] 08/12/2015  08/12/2015, 12:41 PM

## 2015-08-12 NOTE — Progress Notes (Signed)
Speech Language Pathology Daily Session Note  Patient Details  Name: Angelica Beck MRN: [id number] Date of Birth: [birth year]  Today's Date: 08/12/2015 SLP Individual Time: 1330-1400 SLP Individual Time Calculation (min): 30 min  Short Term Goals: Week 1: SLP Short Term Goal 1 (Week 1): Pt will demonstrate adequate oral manipulation of Dys 3 textures across two consecutive sessions to demonstrate readiness for diet advancement SLP Short Term Goal 2 (Week 1): Pt will utilize speech intelligibility strategies within structured tasks at the sentence level with Min cues SLP Short Term Goal 3 (Week 1): Pt will utilize external memory aids to facilitate recall of new and daily information with Min cues  Skilled Therapeutic Interventions: Skilled treatment session focused on speech and cognitive goals. SLP facilitated session by providing Min A verbal cues for use of speech intelligibility strategies at the sentence and conversation level during a structured but informal verbal task to achieve 90% intelligibility.  Patient independently requested to use the bathroom and required Min A verbal cues for problem solving and safety with task. Patient also required Min A question cues to recall events from the previous therapy sessions. Patient left upright in wheelchair with all needs within reach. Continue with current plan of care.   Function:  Cognition Comprehension Comprehension assist level: Understands basic 75 - 89% of the time/ requires cueing 10 - 24% of the time  Expression   Expression assist level: Expresses basic 75 - 89% of the time/requires cueing 10 - 24% of the time. Needs helper to occlude trach/needs to repeat words.  Social Interaction Social Interaction assist level: Interacts appropriately 75 - 89% of the time - Needs redirection for appropriate language or to initiate interaction.  Problem Solving Problem solving assist level: Solves basic 75 - 89% of the time/requires cueing 10  - 24% of the time  Memory Memory assist level: Recognizes or recalls 75 - 89% of the time/requires cueing 10 - 24% of the time    Pain Pain Assessment Pain Assessment: No/denies pain   Therapy/Group: Individual Therapy  Hani Campusano 08/12/2015, 3:45 PM

## 2015-08-12 NOTE — Progress Notes (Signed)
Occupational Therapy Weekly Progress Note  Patient Details  Name: Angelica Beck MRN: [id number] Date of Birth: Aug 11, 1951  Beginning of progress report period: August 06, 2015 End of progress report period: August 12, 2015  Patient has met 3 of 4 short term goals.  Pt has made steady progress with BADLs during this admission.  Pt continues to fatigue quickly and requires multiple rest breaks during BADLs.  Pt is able to stand for approx 30 seconds with steady A when engaged in activities/bathing and dressing tasks.  Pt requires min/mod A for sit<>stand from w/c and min A for functional transfers.   Patient continues to demonstrate the following deficits: decreased activity tolerance, BLE weakness, standing balance and tolerance, and decreased safety awareness and therefore will continue to benefit from skilled OT intervention to enhance overall performance with BADL and Reduce care partner burden.  Patient progressing toward long term goals..  Continue plan of care.  OT Short Term Goals Week 1:  OT Short Term Goal 1 (Week 1): Pt will complete toilet transfer with min assist  OT Short Term Goal 1 - Progress (Week 1): Met OT Short Term Goal 2 (Week 1): Pt will complete therapeutic activity in standing for 2 minutes with min assist for balance OT Short Term Goal 2 - Progress (Week 1): Progressing toward goal OT Short Term Goal 3 (Week 1): Pt will complete LB dressing with max assist OT Short Term Goal 3 - Progress (Week 1): Met OT Short Term Goal 4 (Week 1): Pt will complete toileting task with max assist OT Short Term Goal 4 - Progress (Week 1): Met Week 2:  OT Short Term Goal 1 (Week 2): Pt will perform bathing tasks with mod A with sit<>stand at sink OT Short Term Goal 2 (Week 2): Pt will perform LB dressing tasks with mod A with sit<>stand from w/c at sink OT Short Term Goal 3 (Week 2): Pt will perform toileting tasks with mod A OT Short Term Goal 4 (Week 2): Pt will maintain  standing balance with supervision for 1 minute while completing bathing/dressing tasks  Skilled Therapeutic Interventions/Progress Updates:      Therapy Documentation Precautions:  Precautions Precautions: Fall Restrictions Weight Bearing Restrictions: No  See Function Navigator for Current Functional Status.    Leotis Shames [facility] 08/12/2015, 12:23 PM

## 2015-08-12 NOTE — Progress Notes (Signed)
Occupational Therapy Session Note  Patient Details  Name: Angelica Beck MRN: [id number] Date of Birth: 1951-01-23  Today's Date: 08/12/2015 OT Individual Time: 1000-1100 OT Individual Time Calculation (min): 60 min    Short Term Goals: Week 1:  OT Short Term Goal 1 (Week 1): Pt will complete toilet transfer with min assist  OT Short Term Goal 2 (Week 1): Pt will complete therapeutic activity in standing for 2 minutes with min assist for balance OT Short Term Goal 3 (Week 1): Pt will complete LB dressing with max assist OT Short Term Goal 4 (Week 1): Pt will complete toileting task with max assist  Skilled Therapeutic Interventions/Progress Updates:    Pt resting in w/c upon arrival.  Pt stated she slept better the previous night and was feeling better today. Pt engaged in BADL retraining including bathing and dressing with sit<>stand from w/c at sink.Pt required mod A for sit<>stand this morning but was able to maintain standing balance for approx 30 seconds with steady A to complete LB bathing and dressing tasks.  Pt required more than a reasonable amount of time to complete tasks with multiple rest breaks.  Focus on activity tolerance, sit<>stand, standing balance, task initiation, and safety awareness to increase independence with BADLs and decrease burden of care.  Therapy Documentation Precautions:  Precautions Precautions: Fall Restrictions Weight Bearing Restrictions: No Pain: Pain Assessment Pain Assessment: No/denies pain  See Function Navigator for Current Functional Status.   Therapy/Group: Individual Therapy  Leroy Libman 08/12/2015, 11:03 AM

## 2015-08-12 NOTE — Plan of Care (Signed)
Problem: RH PAIN MANAGEMENT Goal: RH STG PAIN MANAGED AT OR BELOW PT'S PAIN GOAL <5  Outcome: Progressing No c/o pain     

## 2015-08-12 NOTE — Progress Notes (Signed)
Subjective/Complaints: Had a much better night. In good spirits this morning. Eating breakfast. No n/v/d  ROS: Pt denies fever, rash/itching, headache, blurred or double vision,  chest pain, shortness of breath, palpitations, dysuria, dizziness, neck or back pain, bleeding, anxiety, or depression  Objective: Vital Signs: Blood pressure 144/75, pulse 88, temperature 98.1 F (36.7 C), temperature source Oral, resp. rate 18, height '5\' 7"'  (1.702 m), weight 99.837 kg (220 lb 1.6 oz), SpO2 100 %. No results found. Results for orders placed or performed during the hospital encounter of 08/06/15 (from the past 72 hour(s))  Glucose, capillary     Status: Abnormal   Collection Time: 08/09/15 11:19 AM  Result Value Ref Range   Glucose-Capillary 174 (H) 65 - 99 mg/dL  Basic metabolic panel     Status: Abnormal   Collection Time: 08/09/15  2:53 PM  Result Value Ref Range   Sodium 139 135 - 145 mmol/L   Potassium 4.1 3.5 - 5.1 mmol/L    Comment: NO VISIBLE HEMOLYSIS   Chloride 109 101 - 111 mmol/L   CO2 20 (L) 22 - 32 mmol/L   Glucose, Bld 209 (H) 65 - 99 mg/dL   BUN 13 6 - 20 mg/dL   Creatinine, Ser 2.81 (H) 0.44 - 1.00 mg/dL   Calcium 9.4 8.9 - 10.3 mg/dL   GFR calc non Af Amer 17 (L) >60 mL/min   GFR calc Af Amer 20 (L) >60 mL/min    Comment: (NOTE) The eGFR has been calculated using the CKD EPI equation. This calculation has not been validated in all clinical situations. eGFR's persistently <60 mL/min signify possible Chronic Kidney Disease.    Anion gap 10 5 - 15  Glucose, capillary     Status: Abnormal   Collection Time: 08/09/15  4:42 PM  Result Value Ref Range   Glucose-Capillary 148 (H) 65 - 99 mg/dL  Glucose, capillary     Status: Abnormal   Collection Time: 08/09/15  8:41 PM  Result Value Ref Range   Glucose-Capillary 176 (H) 65 - 99 mg/dL   Comment 1 Notify RN   Basic metabolic panel     Status: Abnormal   Collection Time: 08/10/15  4:51 AM  Result Value Ref Range    Sodium 138 135 - 145 mmol/L   Potassium 3.4 (L) 3.5 - 5.1 mmol/L    Comment: DELTA CHECK NOTED   Chloride 107 101 - 111 mmol/L   CO2 25 22 - 32 mmol/L   Glucose, Bld 99 65 - 99 mg/dL   BUN 11 6 - 20 mg/dL   Creatinine, Ser 2.85 (H) 0.44 - 1.00 mg/dL   Calcium 9.2 8.9 - 10.3 mg/dL   GFR calc non Af Amer 17 (L) >60 mL/min   GFR calc Af Amer 19 (L) >60 mL/min    Comment: (NOTE) The eGFR has been calculated using the CKD EPI equation. This calculation has not been validated in all clinical situations. eGFR's persistently <60 mL/min signify possible Chronic Kidney Disease.    Anion gap 6 5 - 15  Glucose, capillary     Status: None   Collection Time: 08/10/15  6:40 AM  Result Value Ref Range   Glucose-Capillary 95 65 - 99 mg/dL   Comment 1 Notify RN   Glucose, capillary     Status: Abnormal   Collection Time: 08/10/15 11:23 AM  Result Value Ref Range   Glucose-Capillary 149 (H) 65 - 99 mg/dL  Glucose, capillary     Status: Abnormal  Collection Time: 08/10/15  4:17 PM  Result Value Ref Range   Glucose-Capillary 187 (H) 65 - 99 mg/dL  Glucose, capillary     Status: Abnormal   Collection Time: 08/10/15  8:22 PM  Result Value Ref Range   Glucose-Capillary 265 (H) 65 - 99 mg/dL  Basic metabolic panel     Status: Abnormal   Collection Time: 08/11/15  5:55 AM  Result Value Ref Range   Sodium 139 135 - 145 mmol/L   Potassium 4.4 3.5 - 5.1 mmol/L   Chloride 106 101 - 111 mmol/L   CO2 22 22 - 32 mmol/L   Glucose, Bld 166 (H) 65 - 99 mg/dL   BUN 11 6 - 20 mg/dL   Creatinine, Ser 2.79 (H) 0.44 - 1.00 mg/dL   Calcium 9.5 8.9 - 10.3 mg/dL   GFR calc non Af Amer 17 (L) >60 mL/min   GFR calc Af Amer 20 (L) >60 mL/min    Comment: (NOTE) The eGFR has been calculated using the CKD EPI equation. This calculation has not been validated in all clinical situations. eGFR's persistently <60 mL/min signify possible Chronic Kidney Disease.    Anion gap 11 5 - 15  Glucose, capillary     Status:  Abnormal   Collection Time: 08/11/15  6:47 AM  Result Value Ref Range   Glucose-Capillary 157 (H) 65 - 99 mg/dL  Glucose, capillary     Status: Abnormal   Collection Time: 08/11/15 11:16 AM  Result Value Ref Range   Glucose-Capillary 262 (H) 65 - 99 mg/dL  Glucose, capillary     Status: Abnormal   Collection Time: 08/11/15  4:08 PM  Result Value Ref Range   Glucose-Capillary 285 (H) 65 - 99 mg/dL  Glucose, capillary     Status: Abnormal   Collection Time: 08/11/15  9:05 PM  Result Value Ref Range   Glucose-Capillary 161 (H) 65 - 99 mg/dL  Glucose, capillary     Status: None   Collection Time: 08/12/15  6:36 AM  Result Value Ref Range   Glucose-Capillary 84 65 - 99 mg/dL     HEENT: edentulous Cardio: RRR Resp: CTA B/L and unlabored GI: BS positive and NT, ND Extremity:  No Edema Skin:   Intact Neuro:Alert. l Motor 4- BUE prox to distal  and 3 BLE,   with HF and 3+ ke,adf/apf, generally 1/2 grade weaker on left Musc/Skel:  Other no pain with UE or LE ROM Gen NAD   Assessment/Plan:  1. Functional deficits secondary to acute encephalopathy which require 3+ hours per day of interdisciplinary therapy in a comprehensive inpatient rehab setting. Physiatrist is providing close team supervision and 24 hour management of active medical problems listed below. Physiatrist and rehab team continue to assess barriers to discharge/monitor patient progress toward functional and medical goals. FIM: Function - Bathing Position: Wheelchair/chair at sink Body parts bathed by patient: Right arm, Left arm, Chest, Abdomen, Front perineal area, Right upper leg, Left upper leg Body parts bathed by helper: Buttocks, Right lower leg, Left lower leg, Back Assist Level:  (mod A)  Function- Upper Body Dressing/Undressing What is the patient wearing?: Bra, Pull over shirt/dress Bra - Perfomed by patient: Thread/unthread right bra strap, Thread/unthread left bra strap Bra - Perfomed by helper:  Hook/unhook bra (pull down sports bra) Pull over shirt/dress - Perfomed by patient: Thread/unthread right sleeve, Thread/unthread left sleeve, Put head through opening Pull over shirt/dress - Perfomed by helper: Pull shirt over trunk Assist Level: Touching or  steadying assistance(Pt > 75%) Function - Lower Body Dressing/Undressing What is the patient wearing?: Underwear, Pants, Socks, Shoes Position: Wheelchair/chair at sink Underwear - Performed by patient: Thread/unthread right underwear leg, Thread/unthread left underwear leg Underwear - Performed by helper: Pull underwear up/down Pants- Performed by helper: Thread/unthread right pants leg, Thread/unthread left pants leg, Pull pants up/down, Fasten/unfasten pants Non-skid slipper socks- Performed by helper: Don/doff right sock, Don/doff left sock Socks - Performed by helper: Don/doff right sock, Don/doff left sock Shoes - Performed by helper: Don/doff right shoe, Don/doff left shoe, Fasten right, Fasten left Assist for footwear: Dependant Assist for lower body dressing:  (total A)  Function - Toileting Toileting activity did not occur: N/A (voiced no need) Toileting steps completed by patient: Adjust clothing prior to toileting, Performs perineal hygiene Toileting steps completed by helper: Adjust clothing after toileting Toileting Assistive Devices: Grab bar or rail Assist level: Touching or steadying assistance (Pt.75%)  Function - Air cabin crew transfer activity did not occur: N/A (voiced no need) Toilet transfer assistive device: Elevated toilet seat/BSC over toilet, Grab bar Assist level to toilet: Moderate assist (Pt 50 - 74%/lift or lower) Assist level from toilet: Moderate assist (Pt 50 - 74%/lift or lower)  Function - Chair/bed transfer Chair/bed transfer method: Stand pivot Chair/bed transfer assist level: Moderate assist (Pt 50 - 74%/lift or lower) Chair/bed transfer assistive device: Armrests,  Walker Chair/bed transfer details: Verbal cues for technique, Verbal cues for sequencing, Verbal cues for precautions/safety  Function - Locomotion: Wheelchair Will patient use wheelchair at discharge?: Yes Type: Manual Max wheelchair distance: 25 Assist Level: Supervision or verbal cues Turns around,maneuvers to table,bed, and toilet,negotiates 3% grade,maneuvers on rugs and over doorsills: No Function - Locomotion: Ambulation Ambulation activity did not occur: Safety/medical concerns (diarrhea/weakness) Assistive device: Parallel bars Max distance: 16 Assist level: Touching or steadying assistance (Pt > 75%) Assist level: Touching or steadying assistance (Pt > 75%) Walk 50 feet with 2 turns activity did not occur: Safety/medical concerns Walk 150 feet activity did not occur: Safety/medical concerns Walk 10 feet on uneven surfaces activity did not occur: Safety/medical concerns  Function - Comprehension Comprehension: Auditory Comprehension assist level: Understands basic 50 - 74% of the time/ requires cueing 25 - 49% of the time  Function - Expression Expression: Verbal Expression assist level: Expresses basic 75 - 89% of the time/requires cueing 10 - 24% of the time. Needs helper to occlude trach/needs to repeat words.  Function - Social Interaction Social Interaction assist level: Interacts appropriately 75 - 89% of the time - Needs redirection for appropriate language or to initiate interaction.  Function - Problem Solving Problem solving assist level: Solves basic 50 - 74% of the time/requires cueing 25 - 49% of the time  Function - Memory Memory assist level: Recognizes or recalls 50 - 74% of the time/requires cueing 25 - 49% of the time Patient normally able to recall (first 3 days only): That he or she is in a hospital, Staff names and faces, Current season  Medical Problem List and Plan: 1. Functional deficits secondary to acute encephalopathy due to urosepsis with  prior history of CVA (right pons, basal ganglia infarcts)-i  -team continues with positive reinforcement 2.  DVT Prophylaxis/Anticoagulation: Subcutaneous heparin indicated. Monitor platelet counts and any signs of bleeding 3. Pain Management: Tylenol as needed 4. Dysphagia. Dysphagia #2 thin liquids. Continued daily clinicaly reassessment               -advance per SLP             -  encourage adequate po 5. Neuropsych: This patient is capable of making decisions on her own behalf. 6. Skin/Wound Care: Routine skin checks 7. Fluids/Electrolytes/Nutrition: I personally reviewed the patient's labs today.   -hypokalemia, repletied.  8. Acute on chronic renal insufficiency. Latest creatinine 3.0       -all labs reviewed again in person today. Cr continues to drift downward             -encourage po fluids 9. Hypertension. bp's have been poorly controlled.Improving 154/75 this a.m.  -increased metoprolol to 70m xr---improved             -utilize prn clonidine for bp> 200/100 10. Hyperlipidemia. Lipitor 11. Diabetes mellitus and peripheral neuropathy. Hemoglobin A1c 6.3. Liberalize diet.  -will follow for pattern at present  -lantus 23 units qhs---cbg's higher in pm  -will observe on lantus 5u in am today---probably needs further titration 12. Klebsiella/pseudomonas Urosepsis:  Continue oral cipro  -if n/v persist, may stop 13. Watery stools have resolved.---observation only for now   14. Insomnia: pt prefers to not be on trazodone---stated that she felt it contributed to her nausea---will dc LOS (Days) 6 A FACE TO FACE EVALUATION WAS PERFORMED  SWARTZ,ZACHARY T 08/12/2015, 9:41 AM

## 2015-08-12 NOTE — Progress Notes (Signed)
Physical Therapy Session Note  Patient Details  Name: Angelica Beck MRN: [id number] Date of Birth: Apr 13, 1951  Today's Date: 08/12/2015 PT Individual Time: 1112-1207 PT Individual Time Calculation (min): 55 min   Short Term Goals: Week 1:  PT Short Term Goal 1 (Week 1): pt will move supine> sit using controls of hospital bed with supervision PT Short Term Goal 2 (Week 1): pt will transfer bed>< w/c with min assist PT Short Term Goal 4 (Week 1): pt will propel w/c x 150' with supervision PT Short Term Goal 5 (Week 1): pt will perform gait x 30' with assistance of 1 person  Skilled Therapeutic Interventions/Progress Updates:   Pt received resting in w/c and agreeable to therapy session.  Session focus on LE strengthening, transfers, activity tolerance, and dynamic sitting/standing balance.  Pt performed toilet transfer at start of session with mod assist to rise from w/c and min assist to rise from elevated toilet seat over toilet.  Total assist for clothing management for time management and energy conservation.  Pt transferred from w/c>therapy mat with RW and stand pivot with mod assist to rise from w/c.  PT engaged patient in dynamic standing balance activity with RW for intermittent single UE support, matching playing cards on mirror.   PT engaged patient in dynamic sitting balance activity with intermittent single UE support and no LE support, matching cards on mirror and playing tic-tac-toe.  Pt reporting increased pain in bilat shoulders and L hip with reaching and weight shifting to L.  PT instructed patient in seated rows with level one theraband 2x10 reps with verbal cues for posture and correct form.  PT instructed patient in amb with RW x25' and x30' with steady assist and w/c follow for safety.  Pt demos decreased step length and narrow BOS.  Verbal cues for increased step length and BOS, upright posture, and walker positioning.  Pt returned to room in w/c and positioned with call bell  in reach and needs met.   Therapy Documentation Precautions:  Precautions Precautions: Fall Restrictions Weight Bearing Restrictions: No Pain: Pain Assessment Pain Assessment: 0-10 Pain Score: 3  Pain Intervention(s): Repositioned;Back rub;Emotional support   See Function Navigator for Current Functional Status.   Therapy/Group: Individual Therapy  Angelica Beck 08/12/2015, 12:28 PM

## 2015-08-13 ENCOUNTER — Inpatient Hospital Stay (HOSPITAL_COMMUNITY): Payer: Medicare Other | Admitting: Speech Pathology

## 2015-08-13 ENCOUNTER — Inpatient Hospital Stay (HOSPITAL_COMMUNITY): Payer: Medicare Other | Admitting: Occupational Therapy

## 2015-08-13 ENCOUNTER — Inpatient Hospital Stay (HOSPITAL_COMMUNITY): Payer: Medicare Other | Admitting: Physical Therapy

## 2015-08-13 LAB — GLUCOSE, CAPILLARY
GLUCOSE-CAPILLARY: 170 mg/dL — AB (ref 65–99)
GLUCOSE-CAPILLARY: 166 mg/dL — AB (ref 65–99)
GLUCOSE-CAPILLARY: 130 mg/dL — AB (ref 65–99)
Glucose-Capillary: 162 mg/dL — ABNORMAL HIGH (ref 65–99)

## 2015-08-13 MED ORDER — INSULIN GLARGINE 100 UNIT/ML ~~LOC~~ SOLN
10.0000 [IU] | Freq: Every day | SUBCUTANEOUS | Status: DC
  Administered 2015-08-14 – 2015-08-16 (×3): 10 [IU] via SUBCUTANEOUS
  Filled 2015-08-13 (×3): qty 0.1

## 2015-08-13 MED ORDER — INSULIN GLARGINE 100 UNIT/ML ~~LOC~~ SOLN
20.0000 [IU] | Freq: Every day | SUBCUTANEOUS | Status: DC
  Administered 2015-08-13 – 2015-08-15 (×3): 20 [IU] via SUBCUTANEOUS
  Filled 2015-08-13 (×4): qty 0.2

## 2015-08-13 NOTE — Progress Notes (Signed)
Speech Language Pathology Weekly Progress and Session Note  Patient Details  Name: Angelica Beck MRN: [id number] Date of Birth: 1951-09-07  Beginning of progress report period: August 06, 2015 End of progress report period: August 13, 2015  Today's Date: 08/13/2015 SLP Individual Time: 1000-1100 SLP Individual Time Calculation (min): 60 min  Short Term Goals: Week 1: SLP Short Term Goal 1 (Week 1): Pt will demonstrate adequate oral manipulation of Dys 3 textures across two consecutive sessions to demonstrate readiness for diet advancement SLP Short Term Goal 1 - Progress (Week 1): Met SLP Short Term Goal 2 (Week 1): Pt will utilize speech intelligibility strategies within structured tasks at the sentence level with Min cues SLP Short Term Goal 2 - Progress (Week 1): Met SLP Short Term Goal 3 (Week 1): Pt will utilize external memory aids to facilitate recall of new and daily information with Min cues SLP Short Term Goal 3 - Progress (Week 1): Met    New Short Term Goals: Week 2: SLP Short Term Goal 1 (Week 2): Pt will utilize external memory aids to facilitate recall of new and daily information with supervision verbal cues SLP Short Term Goal 2 (Week 2): Pt will utilize speech intelligibility strategies within structured tasks at the sentence level with supervision question cues SLP Short Term Goal 3 (Week 2): Patient will consume current diet with minimal overt s/s of aspiration with Mod I for use of swallowing compensatory strategies.   Weekly Progress Updates: Patient has made functional gains and has met 3 of 3 STG's this reporting period due to increased swallowing and cognitive function. Currently, patient is consuming Dys. 3 textures with thin liquids (baseline diet) without overt s/s of aspiration and requires intermittent supervision verbal cues for use of swallowing compensatory strategies. Patient also requires Min A verbal and question cues for recall of new, functional  information and use of her speech intelligibility strategies at the sentence level. Patient and family education is ongoing. Patient would benefit from continued skilled SLP intervention to maximize cognitive and swallowing function and overall functional independence prior to discharge home.    Intensity: Minumum of 1-2 x/day, 30 to 90 minutes Frequency: 3 to 5 out of 7 days Duration/Length of Stay: 08/24/15 Treatment/Interventions: Cognitive remediation/compensation;Cueing hierarchy;Dysphagia/aspiration precaution training;Environmental controls;Functional tasks;Internal/external aids;Patient/family education;Speech/Language facilitation;Therapeutic Activities   Daily Session  Skilled Therapeutic Interventions: Skilled treatment session focused on speech and cognitive goals. SLP facilitated session by providing supervision verbal cues for use of her speech intelligibility strategies at the conversation level to achieve 90% intelligibility. Patient also recalled events from previous therapy sessions with supervision question cues. Patient independently requested to use the bathroom and required Min A verbal cues for safety with transfer. Patient left in wheelchair with all needs within reach. Continue with current plan of care.       Function:   Eating Eating   Modified Consistency Diet: Yes Eating Assist Level: Supervision or verbal cues   Eating Set Up Assist For: Opening containers       Cognition Comprehension Comprehension assist level: Understands basic 75 - 89% of the time/ requires cueing 10 - 24% of the time  Expression   Expression assist level: Expresses basic 75 - 89% of the time/requires cueing 10 - 24% of the time. Needs helper to occlude trach/needs to repeat words.  Social Interaction Social Interaction assist level: Interacts appropriately 75 - 89% of the time - Needs redirection for appropriate language or to initiate interaction.  Problem Solving Problem solving  assist level: Solves basic 75 - 89% of the time/requires cueing 10 - 24% of the time  Memory Memory assist level: Recognizes or recalls 75 - 89% of the time/requires cueing 10 - 24% of the time   Pain No/Denies Pain   Therapy/Group: Individual Therapy  Angelica Beck 08/13/2015, 4:08 PM

## 2015-08-13 NOTE — Progress Notes (Signed)
Occupational Therapy Session Note  Patient Details  Name: Angelica Beck MRN: [id number] Date of Birth: 1950-11-24  Today's Date: 08/13/2015 OT Individual Time:  - 0800-0900  (60 min)  (1st session)      Short Term Goals: Week 1:  OT Short Term Goal 1 (Week 1): Pt will complete toilet transfer with min assist  OT Short Term Goal 1 - Progress (Week 1): Met OT Short Term Goal 2 (Week 1): Pt will complete therapeutic activity in standing for 2 minutes with min assist for balance OT Short Term Goal 2 - Progress (Week 1): Progressing toward goal OT Short Term Goal 3 (Week 1): Pt will complete LB dressing with max assist OT Short Term Goal 3 - Progress (Week 1): Met OT Short Term Goal 4 (Week 1): Pt will complete toileting task with max assist OT Short Term Goal 4 - Progress (Week 1): Met Week 2:  OT Short Term Goal 1 (Week 2): Pt will perform bathing tasks with mod A with sit<>stand at sink OT Short Term Goal 2 (Week 2): Pt will perform LB dressing tasks with mod A with sit<>stand from w/c at sink OT Short Term Goal 3 (Week 2): Pt will perform toileting tasks with mod A OT Short Term Goal 4 (Week 2): Pt will maintain standing balance with supervision for 1 minute while completing bathing/dressing tasks  Skilled Therapeutic Interventions/Progress Updates:   1st session:   Pt lying in bed with HOB and eating breakfast.  Pt. Agreed to bath and dress at sink.  Went from supine to EOB with mod assist.  Scooted to EOB with mod assist to prepare for transfer.  Transfer bed>wc with min assist.  Min  A for sit<>stand and could maintain standing balance for approx 2 min with min to SBA while cleaning peri area.  Needed min assist with donning pants and max assist with footies.  Pt equired more than a reasonable amount of time to complete tasks with multiple rest breaks. Focus on activity tolerance, sit<>stand, standing balance, task initiation, and safety awareness to increase independence with BADLs and  decrease burden of care.  Therapy Documentation Precautions:  Precautions Precautions: Fall Restrictions Weight Bearing Restrictions: No  Lactose intolerant Pain:  None 1st and 2nd session    Other Treatments:    Time:  1500-1540  (40 min) Individual session  Addressed functional mobility, balance in standing, functional mobility, sit to stand and transfers.  Pt transferred from wc to regular chair with increased time and mod assist.  Pt needed increased time to go from sit to stand.  Ambulated with min assist with 2WW and 40 feet.  Needed cues for heel toe stride on RLE.  Propelled wc from room to family room.  Pt retrieved food items with min assist.  Taken back to room.    Left pt in wc with sll needs in reach.        See Function Navigator for Current Functional Status.   Therapy/Group: Individual Therapy  Lisa Roca 08/13/2015, 7:45 AM

## 2015-08-13 NOTE — Progress Notes (Signed)
Subjective/Complaints: Did well last night. Feels that therapy is helping her. Doesn't like food--"doesn't have much taste".   ROS: Pt denies fever, rash/itching, headache, blurred or double vision,  chest pain, shortness of breath, palpitations, dysuria, dizziness, neck or back pain, bleeding, anxiety, or depression  Objective: Vital Signs: Blood pressure 164/75, pulse 89, temperature 98.8 F (37.1 C), temperature source Oral, resp. rate 18, height '5\' 7"'  (1.702 m), weight 98.7 kg (217 lb 9.5 oz), SpO2 99 %. No results found. Results for orders placed or performed during the hospital encounter of 08/06/15 (from the past 72 hour(s))  Glucose, capillary     Status: Abnormal   Collection Time: 08/10/15 11:23 AM  Result Value Ref Range   Glucose-Capillary 149 (H) 65 - 99 mg/dL  Glucose, capillary     Status: Abnormal   Collection Time: 08/10/15  4:17 PM  Result Value Ref Range   Glucose-Capillary 187 (H) 65 - 99 mg/dL  Glucose, capillary     Status: Abnormal   Collection Time: 08/10/15  8:22 PM  Result Value Ref Range   Glucose-Capillary 265 (H) 65 - 99 mg/dL  Basic metabolic panel     Status: Abnormal   Collection Time: 08/11/15  5:55 AM  Result Value Ref Range   Sodium 139 135 - 145 mmol/L   Potassium 4.4 3.5 - 5.1 mmol/L   Chloride 106 101 - 111 mmol/L   CO2 22 22 - 32 mmol/L   Glucose, Bld 166 (H) 65 - 99 mg/dL   BUN 11 6 - 20 mg/dL   Creatinine, Ser 2.79 (H) 0.44 - 1.00 mg/dL   Calcium 9.5 8.9 - 10.3 mg/dL   GFR calc non Af Amer 17 (L) >60 mL/min   GFR calc Af Amer 20 (L) >60 mL/min    Comment: (NOTE) The eGFR has been calculated using the CKD EPI equation. This calculation has not been validated in all clinical situations. eGFR's persistently <60 mL/min signify possible Chronic Kidney Disease.    Anion gap 11 5 - 15  Glucose, capillary     Status: Abnormal   Collection Time: 08/11/15  6:47 AM  Result Value Ref Range   Glucose-Capillary 157 (H) 65 - 99 mg/dL   Glucose, capillary     Status: Abnormal   Collection Time: 08/11/15 11:16 AM  Result Value Ref Range   Glucose-Capillary 262 (H) 65 - 99 mg/dL  Glucose, capillary     Status: Abnormal   Collection Time: 08/11/15  4:08 PM  Result Value Ref Range   Glucose-Capillary 285 (H) 65 - 99 mg/dL  Glucose, capillary     Status: Abnormal   Collection Time: 08/11/15  9:05 PM  Result Value Ref Range   Glucose-Capillary 161 (H) 65 - 99 mg/dL  Glucose, capillary     Status: None   Collection Time: 08/12/15  6:36 AM  Result Value Ref Range   Glucose-Capillary 84 65 - 99 mg/dL  Glucose, capillary     Status: Abnormal   Collection Time: 08/12/15 12:13 PM  Result Value Ref Range   Glucose-Capillary 174 (H) 65 - 99 mg/dL   Comment 1 Notify RN   Glucose, capillary     Status: Abnormal   Collection Time: 08/12/15  4:39 PM  Result Value Ref Range   Glucose-Capillary 177 (H) 65 - 99 mg/dL   Comment 1 Notify RN   Glucose, capillary     Status: Abnormal   Collection Time: 08/12/15  9:46 PM  Result Value Ref Range  Glucose-Capillary 240 (H) 65 - 99 mg/dL  Glucose, capillary     Status: Abnormal   Collection Time: 08/13/15  6:46 AM  Result Value Ref Range   Glucose-Capillary 170 (H) 65 - 99 mg/dL     HEENT: edentulous Cardio: RRR Resp: CTA B/L and unlabored GI: BS positive and NT, ND Extremity:  No Edema Skin:   Intact Neuro:Alert. Left facial droop.  Motor 4- RUE prox to distal . LUE 2-3/5,  and 3 BLE,   with HF and 3+ ke,adf/apf, generally 1/2 grade weaker on left Musc/Skel:  Other no pain with UE or LE ROM Gen NAD   Assessment/Plan:  1. Functional deficits secondary to acute encephalopathy which require 3+ hours per day of interdisciplinary therapy in a comprehensive inpatient rehab setting. Physiatrist is providing close team supervision and 24 hour management of active medical problems listed below. Physiatrist and rehab team continue to assess barriers to discharge/monitor patient  progress toward functional and medical goals. FIM: Function - Bathing Position: Wheelchair/chair at sink Body parts bathed by patient: Right arm, Left arm, Chest, Abdomen, Front perineal area, Right upper leg, Left upper leg, Buttocks Body parts bathed by helper: Right lower leg, Left lower leg, Back Assist Level:  (mod A)  Function- Upper Body Dressing/Undressing What is the patient wearing?: Bra, Pull over shirt/dress Bra - Perfomed by patient: Thread/unthread right bra strap, Thread/unthread left bra strap, Hook/unhook bra (pull down sports bra) (assisted to straighten bra) Bra - Perfomed by helper: Hook/unhook bra (pull down sports bra) Pull over shirt/dress - Perfomed by patient: Thread/unthread right sleeve, Thread/unthread left sleeve, Put head through opening, Pull shirt over trunk Pull over shirt/dress - Perfomed by helper: Pull shirt over trunk Assist Level: Supervision or verbal cues Function - Lower Body Dressing/Undressing What is the patient wearing?: Underwear, Pants, Socks, Shoes Position: Wheelchair/chair at [employer] - Performed by patient: Thread/unthread right underwear leg, Thread/unthread left underwear leg Underwear - Performed by helper: Pull underwear up/down Pants- Performed by patient: Thread/unthread right pants leg, Thread/unthread left pants leg Pants- Performed by helper: Pull pants up/down, Fasten/unfasten pants Non-skid slipper socks- Performed by helper: Don/doff right sock, Don/doff left sock Socks - Performed by helper: Don/doff right sock, Don/doff left sock Shoes - Performed by helper: Don/doff right shoe, Don/doff left shoe, Fasten right, Fasten left Assist for footwear: Dependant Assist for lower body dressing: Touching or steadying assistance (Pt > 75%)  Function - Toileting Toileting activity did not occur: N/A (voiced no need) Toileting steps completed by patient: Adjust clothing prior to toileting, Performs perineal hygiene Toileting  steps completed by helper: Adjust clothing prior to toileting, Adjust clothing after toileting Toileting Assistive Devices: Grab bar or rail Assist level: Touching or steadying assistance (Pt.75%)  Function - Air cabin crew transfer activity did not occur: N/A (voiced no need) Toilet transfer assistive device: Elevated toilet seat/BSC over toilet, Grab bar Assist level to toilet: Moderate assist (Pt 50 - 74%/lift or lower) Assist level from toilet: Moderate assist (Pt 50 - 74%/lift or lower)  Function - Chair/bed transfer Chair/bed transfer method: Stand pivot Chair/bed transfer assist level: Moderate assist (Pt 50 - 74%/lift or lower) Chair/bed transfer assistive device: Walker, Armrests Chair/bed transfer details: Verbal cues for technique, Verbal cues for precautions/safety, Manual facilitation for weight shifting  Function - Locomotion: Wheelchair Will patient use wheelchair at discharge?: Yes Type: Manual (manual in rehab, motorized scooter at home) Max wheelchair distance: 60 Assist Level: Touching or steadying assistance (Pt > 75%) Turns around,maneuvers to  table,bed, and toilet,negotiates 3% grade,maneuvers on rugs and over doorsills: No Function - Locomotion: Ambulation Ambulation activity did not occur: Safety/medical concerns (diarrhea/weakness) Assistive device: Walker-rolling Max distance: 30 Assist level: Moderate assist (Pt 50 - 74%) Assist level: 2 helpers Walk 50 feet with 2 turns activity did not occur: Safety/medical concerns Walk 150 feet activity did not occur: Safety/medical concerns Walk 10 feet on uneven surfaces activity did not occur: Safety/medical concerns  Function - Comprehension Comprehension: Auditory Comprehension assist level: Understands basic 75 - 89% of the time/ requires cueing 10 - 24% of the time  Function - Expression Expression: Verbal Expression assist level: Expresses basic 75 - 89% of the time/requires cueing 10 - 24% of the  time. Needs helper to occlude trach/needs to repeat words.  Function - Social Interaction Social Interaction assist level: Interacts appropriately 75 - 89% of the time - Needs redirection for appropriate language or to initiate interaction.  Function - Problem Solving Problem solving assist level: Solves basic 75 - 89% of the time/requires cueing 10 - 24% of the time  Function - Memory Memory assist level: Recognizes or recalls 75 - 89% of the time/requires cueing 10 - 24% of the time Patient normally able to recall (first 3 days only): That he or she is in a hospital, Staff names and faces, Current season  Medical Problem List and Plan: 1. Functional deficits secondary to acute encephalopathy due to urosepsis with prior history of CVA (right pons, basal ganglia infarcts)-  -continue CIR therapies 2.  DVT Prophylaxis/Anticoagulation: Subcutaneous heparin indicated. Monitor platelet counts and any signs of bleeding 3. Pain Management: Tylenol as needed 4. Dysphagia. Dysphagia #3 thin liquids. Continued daily clinicaly reassessment               -advanced per SLP             -seems to be handling well 5. Neuropsych: This patient is capable of making decisions on her own behalf. 6. Skin/Wound Care: Routine skin checks 7. Fluids/Electrolytes/Nutrition: I personally reviewed the patient's labs today.   -hypokalemia, repleted.  8. Acute on chronic renal insufficiency. Latest creatinine 3.0       -all labs reviewed again in person today. Cr continues to drift downward             -encourage po fluids 9. Hypertension. bp's have been poorly controlled.Improving 154/75 this a.m.  -increased metoprolol to 38m xr---improved             -utilize prn clonidine for bp> 200/100 10. Hyperlipidemia. Lipitor 11. Diabetes mellitus and peripheral neuropathy. Hemoglobin A1c 6.3. Liberalize diet.  -will follow for pattern at present  -lantus 23 units qhs---will drop to 20 units tonight  -increase am  lantus to 10 units 12. Klebsiella/pseudomonas Urosepsis:  dc cipro after today's doses 13. Watery stools have resolved.---observation only for now   14. Insomnia:  No issues off trazodone   LOS (Days) 7 A FACE TO FACE EVALUATION WAS PERFORMED  Tamakia Porto T 08/13/2015, 9:26 AM

## 2015-08-13 NOTE — Progress Notes (Signed)
Physical Therapy Weekly Progress Note  Patient Details  Name: Angelica Beck MRN: [id number] Date of Birth: 12-02-1950  Beginning of progress report period: August 07, 2015 End of progress report period: August 13, 2015  Today's Date: 08/13/2015 PT Individual Time: 1915-5027 PT Individual Time Calculation (min): 54 min   Patient has met 1 of 4 short term goals.  Pt continues to require assist for bed mobility and lifting assist for transfers from lower heights due to LE weakness.    Patient continues to demonstrate the following deficits: strength, pain, balance, coordination, and safety awareness and therefore will continue to benefit from skilled PT intervention to enhance overall performance with activity tolerance, balance and coordination and functional mobility.  Patient progressing toward long term goals..  Continue plan of care.  PT Short Term Goals Week 2:  PT Short Term Goal 1 (Week 2): =LTGs due to ELOS  Skilled Therapeutic Interventions/Progress Updates:    Pt received resting in w/c and agreeable to therapy session, no c/o pain.  Pt able to recall focus of OT and SLP sessions this AM with question cues.  Session focus on LE strengthening, gait training, activity tolerance, and balance.  Pt propelled w/c to therapy gym x150' with combination of propulsion techniques (BUEs, BLEs, and combo of UEs/LEs). PT instructed patient in BLE therex with 3# ankle weights x10 reps of LAQ and seated glute sets, x15 reps of heel/toe raises, and ball squeezes.  Pt performed sit<>stand in // bars with mod assist to rise and performed NMR activities for carryover to gait.  Pt performed toe taps on 4" step alternating R and L foot,toe taps crossing midline on 4" step with R and L foot, and stepping over red foam balance beam forwards and backwards x2 in each direction with steady assist for balance.  Pt requesting to use toilet, pt returned to room in w/c total assist for urgency and performed  toileting, see function tab for details.  PT instructed patient in amb with RW x30' +30' with mod assist for trunk control and balance with verbal cues for step length, walker positioning and upright posture.  Pt returned to room in w/c at end of session and positioned with call bell in reach and needs met.   Therapy Documentation Precautions:  Precautions Precautions: Fall Restrictions Weight Bearing Restrictions: No   See Function Navigator for Current Functional Status.  Therapy/Group: Individual Therapy  Earnest Conroy Penven-Crew 08/13/2015, 8:53 AM

## 2015-08-14 ENCOUNTER — Inpatient Hospital Stay (HOSPITAL_COMMUNITY): Payer: Medicare Other | Admitting: Occupational Therapy

## 2015-08-14 DIAGNOSIS — I10 Essential (primary) hypertension: Secondary | ICD-10-CM | POA: Insufficient documentation

## 2015-08-14 LAB — GLUCOSE, CAPILLARY
GLUCOSE-CAPILLARY: 111 mg/dL — AB (ref 65–99)
GLUCOSE-CAPILLARY: 197 mg/dL — AB (ref 65–99)
GLUCOSE-CAPILLARY: 259 mg/dL — AB (ref 65–99)
Glucose-Capillary: 179 mg/dL — ABNORMAL HIGH (ref 65–99)

## 2015-08-14 NOTE — Progress Notes (Signed)
Subjective/Complaints: Patient seen and examined this morning lying in bed. She states she slept well.  ROS: Denies CP, SOB, N/V/D  Objective: Vital Signs: Blood pressure 149/66, pulse 87, temperature 98.4 F (36.9 C), temperature source Oral, resp. rate 18, height '5\' 7"'$  (1.702 m), weight 98.2 kg (216 lb 7.9 oz), SpO2 100 %. No results found. Results for orders placed or performed during the hospital encounter of 08/06/15 (from the past 72 hour(s))  Glucose, capillary     Status: Abnormal   Collection Time: 08/11/15  4:08 PM  Result Value Ref Range   Glucose-Capillary 285 (H) 65 - 99 mg/dL  Glucose, capillary     Status: Abnormal   Collection Time: 08/11/15  9:05 PM  Result Value Ref Range   Glucose-Capillary 161 (H) 65 - 99 mg/dL  Glucose, capillary     Status: None   Collection Time: 08/12/15  6:36 AM  Result Value Ref Range   Glucose-Capillary 84 65 - 99 mg/dL  Glucose, capillary     Status: Abnormal   Collection Time: 08/12/15 12:13 PM  Result Value Ref Range   Glucose-Capillary 174 (H) 65 - 99 mg/dL   Comment 1 Notify RN   Glucose, capillary     Status: Abnormal   Collection Time: 08/12/15  4:39 PM  Result Value Ref Range   Glucose-Capillary 177 (H) 65 - 99 mg/dL   Comment 1 Notify RN   Glucose, capillary     Status: Abnormal   Collection Time: 08/12/15  9:46 PM  Result Value Ref Range   Glucose-Capillary 240 (H) 65 - 99 mg/dL  Glucose, capillary     Status: Abnormal   Collection Time: 08/13/15  6:46 AM  Result Value Ref Range   Glucose-Capillary 170 (H) 65 - 99 mg/dL  Glucose, capillary     Status: Abnormal   Collection Time: 08/13/15 11:34 AM  Result Value Ref Range   Glucose-Capillary 162 (H) 65 - 99 mg/dL   Comment 1 Notify RN   Glucose, capillary     Status: Abnormal   Collection Time: 08/13/15  4:56 PM  Result Value Ref Range   Glucose-Capillary 166 (H) 65 - 99 mg/dL  Glucose, capillary     Status: Abnormal   Collection Time: 08/13/15  9:01 PM  Result  Value Ref Range   Glucose-Capillary 130 (H) 65 - 99 mg/dL   Comment 1 Notify RN   Glucose, capillary     Status: Abnormal   Collection Time: 08/14/15  6:58 AM  Result Value Ref Range   Glucose-Capillary 111 (H) 65 - 99 mg/dL     Gen: NAD. Vital signs reviewed HEENT: Normocephalic, atraumatic Cardio: RRR. Resp: CTA B/L and unlabored GI: BS positive and NT, ND Neuro: Alert.  Oriented to person and place. Left facial droop.   Motor antigravity strength through out, but weaker on left Musc/Skel:  No edema. No tenderness. Skin:   Intact. Warm and dry.  Assessment/Plan:  1. Functional deficits secondary to acute encephalopathy which require 3+ hours per day of interdisciplinary therapy in a comprehensive inpatient rehab setting. Physiatrist is providing close team supervision and 24 hour management of active medical problems listed below. Physiatrist and rehab team continue to assess barriers to discharge/monitor patient progress toward functional and medical goals. FIM: Function - Bathing Position: Wheelchair/chair at sink Body parts bathed by patient: Right arm, Left arm, Chest, Abdomen, Front perineal area, Right upper leg, Left upper leg, Buttocks Body parts bathed by helper: Right lower leg, Left lower  leg, Back Assist Level:  (mod A)  Function- Upper Body Dressing/Undressing What is the patient wearing?: Bra, Pull over shirt/dress Bra - Perfomed by patient: Thread/unthread right bra strap, Thread/unthread left bra strap, Hook/unhook bra (pull down sports bra) (assisted to straighten bra) Bra - Perfomed by helper: Hook/unhook bra (pull down sports bra) Pull over shirt/dress - Perfomed by patient: Thread/unthread right sleeve, Thread/unthread left sleeve, Put head through opening, Pull shirt over trunk Pull over shirt/dress - Perfomed by helper: Pull shirt over trunk Assist Level: Supervision or verbal cues Function - Lower Body Dressing/Undressing What is the patient wearing?:  Underwear, Pants, Socks, Shoes Position: Wheelchair/chair at [employer] - Performed by patient: Thread/unthread right underwear leg, Thread/unthread left underwear leg Underwear - Performed by helper: Pull underwear up/down Pants- Performed by patient: Thread/unthread right pants leg, Thread/unthread left pants leg Pants- Performed by helper: Pull pants up/down, Fasten/unfasten pants Non-skid slipper socks- Performed by helper: Don/doff right sock, Don/doff left sock Socks - Performed by helper: Don/doff right sock, Don/doff left sock Shoes - Performed by helper: Don/doff right shoe, Don/doff left shoe, Fasten right, Fasten left Assist for footwear: Dependant Assist for lower body dressing: Touching or steadying assistance (Pt > 75%)  Function - Toileting Toileting activity did not occur: N/A (voiced no need) Toileting steps completed by patient: Performs perineal hygiene, Adjust clothing after toileting Toileting steps completed by helper: Adjust clothing prior to toileting Toileting Assistive Devices: Grab bar or rail Assist level: Touching or steadying assistance (Pt.75%), Supervision or verbal cues  Function - Air cabin crew transfer activity did not occur: N/A (voiced no need) Toilet transfer assistive device: Elevated toilet seat/BSC over toilet, Grab bar Assist level to toilet: Moderate assist (Pt 50 - 74%/lift or lower) Assist level from toilet: Supervision or verbal cues  Function - Chair/bed transfer Chair/bed transfer method: Stand pivot Chair/bed transfer assist level: Moderate assist (Pt 50 - 74%/lift or lower) Chair/bed transfer assistive device: Armrests, Walker Chair/bed transfer details: Verbal cues for technique, Verbal cues for precautions/safety, Manual facilitation for weight shifting  Function - Locomotion: Wheelchair Will patient use wheelchair at discharge?: Yes Type: Manual Max wheelchair distance: 150 Assist Level: Supervision or verbal  cues Assist Level: Supervision or verbal cues Assist Level: Supervision or verbal cues Turns around,maneuvers to table,bed, and toilet,negotiates 3% grade,maneuvers on rugs and over doorsills: No Function - Locomotion: Ambulation Ambulation activity did not occur: Safety/medical concerns (diarrhea/weakness) Assistive device: Walker-rolling Max distance: 30+30 Assist level: Moderate assist (Pt 50 - 74%) Assist level: Moderate assist (Pt 50 - 74%) Walk 50 feet with 2 turns activity did not occur: Safety/medical concerns Walk 150 feet activity did not occur: Safety/medical concerns Walk 10 feet on uneven surfaces activity did not occur: Safety/medical concerns  Function - Comprehension Comprehension: Auditory Comprehension assist level: Understands basic 75 - 89% of the time/ requires cueing 10 - 24% of the time  Function - Expression Expression: Verbal Expression assist level: Expresses basic 75 - 89% of the time/requires cueing 10 - 24% of the time. Needs helper to occlude trach/needs to repeat words.  Function - Social Interaction Social Interaction assist level: Interacts appropriately 75 - 89% of the time - Needs redirection for appropriate language or to initiate interaction.  Function - Problem Solving Problem solving assist level: Solves basic 75 - 89% of the time/requires cueing 10 - 24% of the time  Function - Memory Memory assist level: Recognizes or recalls 75 - 89% of the time/requires cueing 10 - 24% of the time  Patient normally able to recall (first 3 days only): That he or she is in a hospital, Staff names and faces, Current season  Medical Problem List and Plan: 1. Functional deficits secondary to acute encephalopathy due to urosepsis with prior history of CVA (right pons, basal ganglia infarcts)  -continue CIR therapies 2.  DVT Prophylaxis/Anticoagulation: Subcutaneous heparin indicated. Monitor platelet counts and any signs of bleeding 3. Pain Management: Tylenol  as needed 4. Dysphagia. Dysphagia #3 thin liquids. Continued daily clinicaly reassessment               -advanced per SLP             -seems to be handling well 5. Neuropsych: This patient is capable of making decisions on her own behalf. 6. Skin/Wound Care: Routine skin checks 7. Fluids/Electrolytes/Nutrition: I personally reviewed the patient's labs today.   -hypokalemia, repleted.   -Will order labs for Monday 8. Acute on chronic renal insufficiency. Latest creatinine 2.79 on 11/16   -Cr continues to drift downward             -encourage po fluids 9. Hypertension. bp's have been poorly controlled.Improving 158/91 this a.m.  -increased metoprolol to '75mg'$  xr---improved             -utilize prn clonidine for bp> 200/100 10. Hyperlipidemia. Lipitor 11. Diabetes mellitus and peripheral neuropathy. Hemoglobin A1c 6.3. Liberalize diet.  -will follow for pattern at present  -lantus 23 units qhs---droped to 20 units   -increase am lantus to 10 units  - Fasting CBG 111 this a.m. 12. Klebsiella/pseudomonas Urosepsis:  cipro d/ced on 11/18 13. Watery stools: Resolved 14. Insomnia:  No issues off trazodone  LOS (Days) 8 A FACE TO FACE EVALUATION WAS PERFORMED  Evva Din Lorie Phenix 08/14/2015, 12:07 PM

## 2015-08-14 NOTE — Progress Notes (Signed)
Occupational Therapy Session Note  Patient Details  Name: Angelica Beck MRN: [id number] Date of Birth: [birth year]  Today's Date: 08/14/2015 OT Individual Time:  - 1330-1415   (45 min)      Short Term Goals: Week 1:  OT Short Term Goal 1 (Week 1): Pt will complete toilet transfer with min assist  OT Short Term Goal 1 - Progress (Week 1): Met OT Short Term Goal 2 (Week 1): Pt will complete therapeutic activity in standing for 2 minutes with min assist for balance OT Short Term Goal 2 - Progress (Week 1): Progressing toward goal OT Short Term Goal 3 (Week 1): Pt will complete LB dressing with max assist OT Short Term Goal 3 - Progress (Week 1): Met OT Short Term Goal 4 (Week 1): Pt will complete toileting task with max assist OT Short Term Goal 4 - Progress (Week 1): Met Week 2:  OT Short Term Goal 1 (Week 2): Pt will perform bathing tasks with mod A with sit<>stand at sink OT Short Term Goal 2 (Week 2): Pt will perform LB dressing tasks with mod A with sit<>stand from w/c at sink OT Short Term Goal 3 (Week 2): Pt will perform toileting tasks with mod A OT Short Term Goal 4 (Week 2): Pt will maintain standing balance with supervision for 1 minute while completing bathing/dressing tasks Week 3:     Skilled Therapeutic Interventions/Progress Updates:    Addressed functional mobility, balance in standing, UE strength and ROM, sit to stand and transfers. Pt sitting in wc upon OT arrival.  Pt wanted to sponge bathe at sink.   Performed bathing with pt having episode of urine incontinence while bathing.  Transferred to toilet.  Stood for 1 1/2 minutes during cleaning.  Stood again for 2 minutes with min assist.  Needed mod assist with sit to stand.  Pt donned depends from her home with increased time and assist.  See Function Navigator for Current Functional Status.        Therapy Documentation Precautions:  Precautions Precautions: Fall Restrictions Weight Bearing Restrictions: No       Pain:  none            Therapy/Group: Individual Therapy  Lisa Roca 08/14/2015, 12:39 PM

## 2015-08-15 ENCOUNTER — Inpatient Hospital Stay (HOSPITAL_COMMUNITY): Payer: Medicare Other | Admitting: Physical Therapy

## 2015-08-15 DIAGNOSIS — Z794 Long term (current) use of insulin: Secondary | ICD-10-CM

## 2015-08-15 DIAGNOSIS — E1310 Other specified diabetes mellitus with ketoacidosis without coma: Secondary | ICD-10-CM

## 2015-08-15 LAB — GLUCOSE, CAPILLARY
GLUCOSE-CAPILLARY: 147 mg/dL — AB (ref 65–99)
GLUCOSE-CAPILLARY: 163 mg/dL — AB (ref 65–99)
Glucose-Capillary: 232 mg/dL — ABNORMAL HIGH (ref 65–99)
Glucose-Capillary: 289 mg/dL — ABNORMAL HIGH (ref 65–99)

## 2015-08-15 NOTE — Progress Notes (Signed)
Physical Therapy Session Note  Patient Details  Name: Angelica Beck MRN: [id number] Date of Birth: 08-Mar-1951  Today's Date: 08/15/2015 PT Individual Time: 0932-6712 PT Individual Time Calculation (min): 30 min   Short Term Goals: Week 2:  PT Short Term Goal 1 (Week 2): =LTGs due to ELOS  Skilled Therapeutic Interventions/Progress Updates:   Pt received sitting in w/c in room, agreeable to therapy session.  Pt self propelled to therapy gym x 50' with BLEs for overall strengthening, endurance and NMR in LLE.  Min A at times with cues for scanning L environment to avoid obstacles.  Once in therapy gym, performed stand pivot to mat without AD at min A level.  Cues for scooting to EOC prior to standing, esp with L hip. Performed 5 sit<>stand without RW at min A to close S level with cues for hand placement and increased forward weight shift.  Progressed to ambulation with RW x 45' x 1 and another 110' x 1 at min A level with cues for upright posture, positioning of RW at midline, and increased L step length.  Ended session with stair negotiation x 8, 3" steps with B handrails in alternating fashion to ascend and step to to descend for safety with cues for safety and sequencing.  Tolerated well with seated rest breaks in between activities.  Assisted back to room and left with all needs in reach.    Therapy Documentation Precautions:  Precautions Precautions: Fall Restrictions Weight Bearing Restrictions: No   Vital Signs: Therapy Vitals Pulse Rate: 89 BP: (!) 148/72 mmHg Pain: Pain Assessment Pain Assessment: No/denies pain   See Function Navigator for Current Functional Status.   Therapy/Group: Individual Therapy  Denice Bors 08/15/2015, 11:29 AM

## 2015-08-15 NOTE — Progress Notes (Signed)
Subjective/Complaints: Sincerely examined this AM sitting up in bed. She states she slept well last night and is doing well this morning.  ROS: Denies CP, SOB, N/V/D  Objective: Vital Signs: Blood pressure 148/72, pulse 89, temperature 98 F (36.7 C), temperature source Oral, resp. rate 18, height '5\' 7"'$  (1.702 m), weight 96.3 kg (212 lb 4.9 oz), SpO2 100 %. No results found. Results for orders placed or performed during the hospital encounter of 08/06/15 (from the past 72 hour(s))  Glucose, capillary     Status: Abnormal   Collection Time: 08/12/15 12:13 PM  Result Value Ref Range   Glucose-Capillary 174 (H) 65 - 99 mg/dL   Comment 1 Notify RN   Glucose, capillary     Status: Abnormal   Collection Time: 08/12/15  4:39 PM  Result Value Ref Range   Glucose-Capillary 177 (H) 65 - 99 mg/dL   Comment 1 Notify RN   Glucose, capillary     Status: Abnormal   Collection Time: 08/12/15  9:46 PM  Result Value Ref Range   Glucose-Capillary 240 (H) 65 - 99 mg/dL  Glucose, capillary     Status: Abnormal   Collection Time: 08/13/15  6:46 AM  Result Value Ref Range   Glucose-Capillary 170 (H) 65 - 99 mg/dL  Glucose, capillary     Status: Abnormal   Collection Time: 08/13/15 11:34 AM  Result Value Ref Range   Glucose-Capillary 162 (H) 65 - 99 mg/dL   Comment 1 Notify RN   Glucose, capillary     Status: Abnormal   Collection Time: 08/13/15  4:56 PM  Result Value Ref Range   Glucose-Capillary 166 (H) 65 - 99 mg/dL  Glucose, capillary     Status: Abnormal   Collection Time: 08/13/15  9:01 PM  Result Value Ref Range   Glucose-Capillary 130 (H) 65 - 99 mg/dL   Comment 1 Notify RN   Glucose, capillary     Status: Abnormal   Collection Time: 08/14/15  6:58 AM  Result Value Ref Range   Glucose-Capillary 111 (H) 65 - 99 mg/dL  Glucose, capillary     Status: Abnormal   Collection Time: 08/14/15 11:36 AM  Result Value Ref Range   Glucose-Capillary 179 (H) 65 - 99 mg/dL   Comment 1 Notify RN    Glucose, capillary     Status: Abnormal   Collection Time: 08/14/15  4:19 PM  Result Value Ref Range   Glucose-Capillary 197 (H) 65 - 99 mg/dL   Comment 1 Notify RN   Glucose, capillary     Status: Abnormal   Collection Time: 08/14/15  9:01 PM  Result Value Ref Range   Glucose-Capillary 259 (H) 65 - 99 mg/dL  Glucose, capillary     Status: Abnormal   Collection Time: 08/15/15  6:50 AM  Result Value Ref Range   Glucose-Capillary 147 (H) 65 - 99 mg/dL     Gen: NAD. Vital signs reviewed HEENT: Normocephalic, atraumatic Cardio: RRR. Resp: CTA B/L and unlabored GI: BS positive and NT, ND Neuro: Alert.  Oriented to person and place. Left facial droop.   Motor antigravity strength through out, but weaker on left Musc/Skel:  No edema. No tenderness. Skin:   Intact. Warm and dry.  Assessment/Plan:  1. Functional deficits secondary to acute encephalopathy which require 3+ hours per day of interdisciplinary therapy in a comprehensive inpatient rehab setting. Physiatrist is providing close team supervision and 24 hour management of active medical problems listed below. Physiatrist and rehab team  continue to assess barriers to discharge/monitor patient progress toward functional and medical goals. FIM: Function - Bathing Position: Wheelchair/chair at sink Body parts bathed by patient: Right arm, Left arm, Chest, Abdomen, Front perineal area, Right upper leg, Left upper leg, Buttocks Body parts bathed by helper: Right lower leg, Left lower leg, Back Assist Level:  (mod A)  Function- Upper Body Dressing/Undressing What is the patient wearing?: Bra, Pull over shirt/dress Bra - Perfomed by patient: Thread/unthread right bra strap, Thread/unthread left bra strap, Hook/unhook bra (pull down sports bra) (assisted to straighten bra) Bra - Perfomed by helper: Hook/unhook bra (pull down sports bra) Pull over shirt/dress - Perfomed by patient: Thread/unthread right sleeve, Thread/unthread left  sleeve, Put head through opening, Pull shirt over trunk Pull over shirt/dress - Perfomed by helper: Pull shirt over trunk Assist Level: Supervision or verbal cues Function - Lower Body Dressing/Undressing What is the patient wearing?: Underwear, Pants, Socks, Shoes Position: Wheelchair/chair at [employer] - Performed by patient: Thread/unthread right underwear leg, Thread/unthread left underwear leg Underwear - Performed by helper: Pull underwear up/down Pants- Performed by patient: Thread/unthread right pants leg, Thread/unthread left pants leg Pants- Performed by helper: Pull pants up/down, Fasten/unfasten pants Non-skid slipper socks- Performed by helper: Don/doff right sock, Don/doff left sock Socks - Performed by helper: Don/doff right sock, Don/doff left sock Shoes - Performed by helper: Don/doff right shoe, Don/doff left shoe, Fasten right, Fasten left Assist for footwear: Dependant Assist for lower body dressing: Touching or steadying assistance (Pt > 75%)  Function - Toileting Toileting activity did not occur: N/A (voiced no need) Toileting steps completed by patient: Performs perineal hygiene, Adjust clothing after toileting Toileting steps completed by helper: Adjust clothing prior to toileting Toileting Assistive Devices: Grab bar or rail Assist level: Touching or steadying assistance (Pt.75%), Supervision or verbal cues  Function - Air cabin crew transfer activity did not occur: N/A (voiced no need) Toilet transfer assistive device: Elevated toilet seat/BSC over toilet, Grab bar Assist level to toilet: Moderate assist (Pt 50 - 74%/lift or lower) Assist level from toilet: Supervision or verbal cues  Function - Chair/bed transfer Chair/bed transfer method: Stand pivot Chair/bed transfer assist level: Moderate assist (Pt 50 - 74%/lift or lower) Chair/bed transfer assistive device: Armrests, Walker Chair/bed transfer details: Verbal cues for technique, Verbal  cues for precautions/safety, Manual facilitation for weight shifting  Function - Locomotion: Wheelchair Will patient use wheelchair at discharge?: Yes Type: Manual Max wheelchair distance: 150 Assist Level: Supervision or verbal cues Assist Level: Supervision or verbal cues Assist Level: Supervision or verbal cues Turns around,maneuvers to table,bed, and toilet,negotiates 3% grade,maneuvers on rugs and over doorsills: No Function - Locomotion: Ambulation Ambulation activity did not occur: Safety/medical concerns (diarrhea/weakness) Assistive device: Walker-rolling Max distance: 30+30 Assist level: Moderate assist (Pt 50 - 74%) Assist level: Moderate assist (Pt 50 - 74%) Walk 50 feet with 2 turns activity did not occur: Safety/medical concerns Walk 150 feet activity did not occur: Safety/medical concerns Walk 10 feet on uneven surfaces activity did not occur: Safety/medical concerns  Function - Comprehension Comprehension: Auditory Comprehension assist level: Understands basic 75 - 89% of the time/ requires cueing 10 - 24% of the time  Function - Expression Expression: Verbal Expression assist level: Expresses basic 75 - 89% of the time/requires cueing 10 - 24% of the time. Needs helper to occlude trach/needs to repeat words.  Function - Social Interaction Social Interaction assist level: Interacts appropriately 75 - 89% of the time - Needs redirection for appropriate  language or to initiate interaction.  Function - Problem Solving Problem solving assist level: Solves basic 75 - 89% of the time/requires cueing 10 - 24% of the time  Function - Memory Memory assist level: Recognizes or recalls 75 - 89% of the time/requires cueing 10 - 24% of the time Patient normally able to recall (first 3 days only): That he or she is in a hospital, Staff names and faces, Current season  Medical Problem List and Plan: 1. Functional deficits secondary to acute encephalopathy due to urosepsis with  prior history of CVA (right pons, basal ganglia infarcts)  -continue CIR therapies 2.  DVT Prophylaxis/Anticoagulation: Subcutaneous heparin indicated. Monitor platelet counts and any signs of bleeding 3. Pain Management: Tylenol as needed 4. Dysphagia. Dysphagia #3 thin liquids. Continued daily clinicaly reassessment               -advanced per SLP             -seems to be handling well 5. Neuropsych: This patient is capable of making decisions on her own behalf. 6. Skin/Wound Care: Routine skin checks 7. Fluids/Electrolytes/Nutrition: I personally reviewed the patient's labs today.   -hypokalemia, repleted.   -Labs ordered for Monday 8. Acute on chronic renal insufficiency. Latest creatinine 2.79 on 11/16   -Cr continues to drift downward             -encourage po fluids 9. Hypertension. bp's have been poorly controlled.Improving 120/63 this a.m.  -increased metoprolol to '75mg'$  xr---improved             -utilize prn clonidine for bp> 200/100 10. Hyperlipidemia. Lipitor 11. Diabetes mellitus and peripheral neuropathy. Hemoglobin A1c 6.3. Liberalized diet.  -will follow for pattern at present  -lantus 23 units qhs---droped to 20 units   -increase am lantus to 10 units  -Fasting CBG 147 this a.m. 12. Klebsiella/pseudomonas Urosepsis:  cipro d/ced on 11/18 13. Watery stools: Resolved 14. Insomnia:  No issues off trazodone  LOS (Days) 9 A FACE TO FACE EVALUATION WAS PERFORMED  Ankit Lorie Phenix 08/15/2015, 9:41 AM

## 2015-08-16 ENCOUNTER — Inpatient Hospital Stay (HOSPITAL_COMMUNITY): Payer: Medicare Other | Admitting: Physical Therapy

## 2015-08-16 ENCOUNTER — Inpatient Hospital Stay (HOSPITAL_COMMUNITY): Payer: Medicare Other | Admitting: Occupational Therapy

## 2015-08-16 ENCOUNTER — Inpatient Hospital Stay (HOSPITAL_COMMUNITY): Payer: Medicare Other | Admitting: Speech Pathology

## 2015-08-16 DIAGNOSIS — I10 Essential (primary) hypertension: Secondary | ICD-10-CM

## 2015-08-16 LAB — GLUCOSE, CAPILLARY
GLUCOSE-CAPILLARY: 98 mg/dL (ref 65–99)
GLUCOSE-CAPILLARY: 298 mg/dL — AB (ref 65–99)
GLUCOSE-CAPILLARY: 230 mg/dL — AB (ref 65–99)
Glucose-Capillary: 104 mg/dL — ABNORMAL HIGH (ref 65–99)

## 2015-08-16 MED ORDER — INSULIN GLARGINE 100 UNIT/ML ~~LOC~~ SOLN
15.0000 [IU] | Freq: Every day | SUBCUTANEOUS | Status: DC
  Administered 2015-08-17 – 2015-08-18 (×2): 15 [IU] via SUBCUTANEOUS
  Filled 2015-08-16 (×3): qty 0.15

## 2015-08-16 NOTE — Progress Notes (Signed)
Occupational Therapy Session Note  Patient Details  Name: Angelica Beck MRN: [id number] Date of Birth: [birth year]  Today's Date: 08/16/2015 OT Individual Time: 5885-0277 OT Individual Time Calculation (min): 53 min    Short Term Goals: Week 2:  OT Short Term Goal 1 (Week 2): Pt will perform bathing tasks with mod A with sit<>stand at sink OT Short Term Goal 2 (Week 2): Pt will perform LB dressing tasks with mod A with sit<>stand from w/c at sink OT Short Term Goal 3 (Week 2): Pt will perform toileting tasks with mod A OT Short Term Goal 4 (Week 2): Pt will maintain standing balance with supervision for 1 minute while completing bathing/dressing tasks  Skilled Therapeutic Interventions/Progress Updates:  Upon entering the room, pt supine in bed with no c/o pain. Pt requiring coaxing for participation this session. Pt stating, "I'm not doing any of this until I done ate breakfast." OT educated pt on OT purpose and importance of participation in therapy with pt verbalizing understanding. Pt performed supine >sit with mod A. Sit <>stand with lifting assistance to transfer into wheelchair with stand pivot and use of RW. Pt performed grooming tasks while seated in wheelchair at sink side with set up A. Pt refusing bathing,dressing, and toileting this session. Pt seated in wheelchair with set up A to open containers for breakfast. Pt required intermittent supervision for safety and OT education pt on plans for discharge. Pt remained seated in wheelchair with call bell and all needed items within reach.   Therapy Documentation Precautions:  Precautions Precautions: Fall Restrictions Weight Bearing Restrictions: No Vital Signs: Therapy Vitals Temp: 98.4 F (36.9 C) Temp Source: Oral Pulse Rate: 90 Resp: 18 BP: 117/80 mmHg Patient Position (if appropriate): Lying Oxygen Therapy SpO2: 100 % O2 Device: Not Delivered  See Function Navigator for Current Functional  Status.   Therapy/Group: Individual Therapy  Phineas Semen 08/16/2015, 7:58 AM

## 2015-08-16 NOTE — Progress Notes (Signed)
Physical Therapy Session Note  Patient Details  Name: Angelica Beck MRN: [id number] Date of Birth: 03/02/1951  Today's Date: 08/16/2015 PT Individual Time: 0905-1005 PT Individual Time Calculation (min): 60 min   Short Term Goals: Week 2:  PT Short Term Goal 1 (Week 2): =LTGs due to ELOS  Skilled Therapeutic Interventions/Progress Updates:   Pt received resting in w/c, agreeable to therapy session.  Session focused on progression of independence with functional transfers, safety awareness, NMR, and strengthening.  Pt propelled w/c into bathroom and performed stand pivot to/from toilet with grab bar and elevated toilet seat over commode with supervision/steady assist for safety.  Pt performed hygiene, and able to perform dressing tasks sitting on toilet and in standing with supervision for safety during pulling up pants/brief.  Pt propelled w/c to ortho gym using BLEs until fatigued with verbal cues for use of LLE and NMR.  Pt performed car transfer using RW with supervision to stand from w/c and min assist to stand from sedan height car with verbal cues for sequencing.  PT instructed patient in BLE therex with 3# ankle weights LAQ, hip flexion, ball squeezes, and hip abd with level one theraband.  PT instructed patient in 6" step negotiation with 2 hand rails.  Pt does not have hand rails at home but was unable to ascend without use of rails.  Pt propelled w/c back to room with supervision and positioned with call bell in reach and needs met.   Therapy Documentation Precautions:  Precautions Precautions: Fall Restrictions Weight Bearing Restrictions: No Pain: Pain Assessment Pain Assessment: No/denies pain   See Function Navigator for Current Functional Status.   Therapy/Group: Individual Therapy  Ravleen Ries E Penven-Crew 08/16/2015, 10:35 AM

## 2015-08-16 NOTE — Progress Notes (Signed)
Physical Therapy Session Note  Patient Details  Name: Angelica Beck MRN: [id number] Date of Birth: 17-Jul-1951  Today's Date: 08/16/2015 PT Individual Time: 1432-1500 PT Individual Time Calculation (min): 28 min   Short Term Goals: Week 2:  PT Short Term Goal 1 (Week 2): =LTGs due to ELOS  Skilled Therapeutic Interventions/Progress Updates:   Pt received resting in w/c and agreeable to therapy.  Session focus on gait training with RW and overall strengthening/endurance.  Pt reports that she typically uses her w/c or scooter in the house, which is different than reports in previous sessions with this therapist that she used only her RW in the house.  PT instructed patient in amb x50' + 30' with RW and steady assist for safety with verbal cues for step length, walker position, and upright posture.  Pt propelled w/c remainder of the way to the gym (~70) with BLEs.  PT instructed patient in 8 min on UEB at level 4 for endurance and UE strengthening.  Pt transported back to room at end of session and positioned with call bell in reach and needs met.     Therapy Documentation Precautions:  Precautions Precautions: Fall Restrictions Weight Bearing Restrictions: No Pain: Pain Assessment Pain Assessment: No/denies pain   See Function Navigator for Current Functional Status.   Therapy/Group: Individual Therapy  Earnest Conroy Penven-Crew 08/16/2015, 4:44 PM

## 2015-08-16 NOTE — Progress Notes (Addendum)
Subjective/Complaints: Up in w/c. Just finished OT. Appetite better. Slept well. Denies pain. Bowels and bladder moving.  ROS: Denies CP, SOB, N/V/D, anxiety, insomnia, visual issues  Objective: Vital Signs: Blood pressure 117/80, pulse 90, temperature 98.4 F (36.9 C), temperature source Oral, resp. rate 18, height '5\' 7"'$  (1.702 m), weight 98.4 kg (216 lb 14.9 oz), SpO2 100 %. No results found. Results for orders placed or performed during the hospital encounter of 08/06/15 (from the past 72 hour(s))  Glucose, capillary     Status: Abnormal   Collection Time: 08/13/15 11:34 AM  Result Value Ref Range   Glucose-Capillary 162 (H) 65 - 99 mg/dL   Comment 1 Notify RN   Glucose, capillary     Status: Abnormal   Collection Time: 08/13/15  4:56 PM  Result Value Ref Range   Glucose-Capillary 166 (H) 65 - 99 mg/dL  Glucose, capillary     Status: Abnormal   Collection Time: 08/13/15  9:01 PM  Result Value Ref Range   Glucose-Capillary 130 (H) 65 - 99 mg/dL   Comment 1 Notify RN   Glucose, capillary     Status: Abnormal   Collection Time: 08/14/15  6:58 AM  Result Value Ref Range   Glucose-Capillary 111 (H) 65 - 99 mg/dL  Glucose, capillary     Status: Abnormal   Collection Time: 08/14/15 11:36 AM  Result Value Ref Range   Glucose-Capillary 179 (H) 65 - 99 mg/dL   Comment 1 Notify RN   Glucose, capillary     Status: Abnormal   Collection Time: 08/14/15  4:19 PM  Result Value Ref Range   Glucose-Capillary 197 (H) 65 - 99 mg/dL   Comment 1 Notify RN   Glucose, capillary     Status: Abnormal   Collection Time: 08/14/15  9:01 PM  Result Value Ref Range   Glucose-Capillary 259 (H) 65 - 99 mg/dL  Glucose, capillary     Status: Abnormal   Collection Time: 08/15/15  6:50 AM  Result Value Ref Range   Glucose-Capillary 147 (H) 65 - 99 mg/dL  Glucose, capillary     Status: Abnormal   Collection Time: 08/15/15 11:53 AM  Result Value Ref Range   Glucose-Capillary 163 (H) 65 - 99 mg/dL   Glucose, capillary     Status: Abnormal   Collection Time: 08/15/15  4:44 PM  Result Value Ref Range   Glucose-Capillary 232 (H) 65 - 99 mg/dL  Glucose, capillary     Status: Abnormal   Collection Time: 08/15/15  9:07 PM  Result Value Ref Range   Glucose-Capillary 289 (H) 65 - 99 mg/dL   Comment 1 Notify RN   Glucose, capillary     Status: None   Collection Time: 08/16/15  7:02 AM  Result Value Ref Range   Glucose-Capillary 98 65 - 99 mg/dL   Comment 1 Notify RN      Gen: NAD. Vital signs reviewed HEENT: Normocephalic, atraumatic Cardio: RRR. Resp: CTA B/L and unlabored GI: BS positive and NT, ND Neuro: Alert.  Oriented to person and place. Left facial droop.   Motor antigravity strength through out, but weaker on left Musc/Skel:  No edema. No tenderness. Skin:   Intact. Warm and dry.  Assessment/Plan:  1. Functional deficits secondary to acute encephalopathy which require 3+ hours per day of interdisciplinary therapy in a comprehensive inpatient rehab setting. Physiatrist is providing close team supervision and 24 hour management of active medical problems listed below. Physiatrist and rehab team continue to  assess barriers to discharge/monitor patient progress toward functional and medical goals. FIM: Function - Bathing Bathing activity did not occur: Refused Position: Wheelchair/chair at sink Body parts bathed by patient: Right arm, Left arm, Chest, Abdomen, Front perineal area, Right upper leg, Left upper leg, Buttocks Body parts bathed by helper: Right lower leg, Left lower leg, Back Assist Level:  (mod A)  Function- Upper Body Dressing/Undressing Upper body dressing/undressing activity did not occur: Refused What is the patient wearing?: Bra, Pull over shirt/dress Bra - Perfomed by patient: Thread/unthread right bra strap, Thread/unthread left bra strap, Hook/unhook bra (pull down sports bra) (assisted to straighten bra) Bra - Perfomed by helper: Hook/unhook bra  (pull down sports bra) Pull over shirt/dress - Perfomed by patient: Thread/unthread right sleeve, Thread/unthread left sleeve, Put head through opening, Pull shirt over trunk Pull over shirt/dress - Perfomed by helper: Pull shirt over trunk Assist Level: Supervision or verbal cues Function - Lower Body Dressing/Undressing Lower body dressing/undressing activity did not occur: Refused What is the patient wearing?: Underwear, Pants, Socks, Shoes Position: Wheelchair/chair at [employer] - Performed by patient: Thread/unthread right underwear leg, Thread/unthread left underwear leg Underwear - Performed by helper: Pull underwear up/down Pants- Performed by patient: Thread/unthread right pants leg, Thread/unthread left pants leg Pants- Performed by helper: Pull pants up/down, Fasten/unfasten pants Non-skid slipper socks- Performed by helper: Don/doff right sock, Don/doff left sock Socks - Performed by helper: Don/doff right sock, Don/doff left sock Shoes - Performed by helper: Don/doff right shoe, Don/doff left shoe, Fasten right, Fasten left Assist for footwear: Dependant Assist for lower body dressing: Touching or steadying assistance (Pt > 75%)  Function - Toileting Toileting activity did not occur: N/A (voiced no need) Toileting steps completed by patient: Performs perineal hygiene, Adjust clothing after toileting Toileting steps completed by helper: Adjust clothing prior to toileting Toileting Assistive Devices: Grab bar or rail Assist level: Touching or steadying assistance (Pt.75%), Supervision or verbal cues  Function - Air cabin crew transfer activity did not occur: N/A (voiced no need) Toilet transfer assistive device: Elevated toilet seat/BSC over toilet, Grab bar Assist level to toilet: Moderate assist (Pt 50 - 74%/lift or lower) Assist level from toilet: Supervision or verbal cues  Function - Chair/bed transfer Chair/bed transfer method: Stand pivot Chair/bed  transfer assist level: Moderate assist (Pt 50 - 74%/lift or lower) Chair/bed transfer assistive device: Walker, Bedrails, Armrests Chair/bed transfer details: Verbal cues for technique, Verbal cues for precautions/safety, Manual facilitation for weight shifting  Function - Locomotion: Wheelchair Will patient use wheelchair at discharge?: Yes Type: Manual Max wheelchair distance: 50 Assist Level: Touching or steadying assistance (Pt > 75%) Assist Level: Touching or steadying assistance (Pt > 75%) Assist Level: Supervision or verbal cues Turns around,maneuvers to table,bed, and toilet,negotiates 3% grade,maneuvers on rugs and over doorsills: No Function - Locomotion: Ambulation Ambulation activity did not occur: Safety/medical concerns (diarrhea/weakness) Assistive device: Walker-rolling Max distance: 50 Assist level: Touching or steadying assistance (Pt > 75%) Assist level: Touching or steadying assistance (Pt > 75%) Walk 50 feet with 2 turns activity did not occur: Safety/medical concerns Assist level: Touching or steadying assistance (Pt > 75%) Walk 150 feet activity did not occur: Safety/medical concerns Walk 10 feet on uneven surfaces activity did not occur: Safety/medical concerns  Function - Comprehension Comprehension: Auditory Comprehension assist level: Understands basic 75 - 89% of the time/ requires cueing 10 - 24% of the time  Function - Expression Expression: Verbal Expression assist level: Expresses basic 75 - 89% of the  time/requires cueing 10 - 24% of the time. Needs helper to occlude trach/needs to repeat words.  Function - Social Interaction Social Interaction assist level: Interacts appropriately 75 - 89% of the time - Needs redirection for appropriate language or to initiate interaction.  Function - Problem Solving Problem solving assist level: Solves basic 75 - 89% of the time/requires cueing 10 - 24% of the time  Function - Memory Memory assist level:  Recognizes or recalls 75 - 89% of the time/requires cueing 10 - 24% of the time Patient normally able to recall (first 3 days only): That he or she is in a hospital, Staff names and faces, Current season  Medical Problem List and Plan: 1. Functional deficits secondary to acute encephalopathy due to urosepsis with prior history of CVA (right pons, basal ganglia infarcts)  -continue CIR therapies 2.  DVT Prophylaxis/Anticoagulation: Subcutaneous heparin indicated. Monitor platelet counts and any signs of bleeding 3. Pain Management: Tylenol as needed 4. Dysphagia. Dysphagia #3 thin liquids. Continued daily clinicaly reassessment               -advanced per SLP             -seems to be handling well 5. Neuropsych: This patient is capable of making decisions on her own behalf. 6. Skin/Wound Care: Routine skin checks 7. Fluids/Electrolytes/Nutrition: I personally reviewed the patient's labs today.   -hypokalemia, repleted.   -Labs ordered for tomorrow 8. Acute on chronic renal insufficiency. Latest creatinine 2.79 on 11/16   -Cr continues to drift downward--recheck today             -encourage po fluidss 9. Hypertension. bp's have been poorly controlled.Improving 117/80 this a.m.  -increased metoprolol to '75mg'$  xr---improved             -utilize prn clonidine for bp> 200/100 10. Hyperlipidemia. Lipitor 11. Diabetes mellitus and peripheral neuropathy. Hemoglobin A1c 6.3. Liberalized diet.  -will follow for pattern at present  -lantus ---decrease to 15 units  -increase am lantus to 15 units  -Fasting CBG 98 this a.m. 12. Klebsiella/pseudomonas Urosepsis:  cipro d/ced on 11/18 13. Watery stools: Resolved 14. Insomnia:  No issues off trazodone  LOS (Days) 10 A FACE TO FACE EVALUATION WAS PERFORMED  SWARTZ,ZACHARY T 08/16/2015, 9:09 AM

## 2015-08-16 NOTE — Plan of Care (Signed)
Problem: RH PAIN MANAGEMENT Goal: RH STG PAIN MANAGED AT OR BELOW PT'S PAIN GOAL <2  Outcome: Not Progressing Rates pain 8/10

## 2015-08-16 NOTE — Progress Notes (Signed)
Speech Language Pathology Daily Session Note  Patient Details  Name: Angelica Beck MRN: [id number] Date of Birth: [birth year]  Today's Date: 08/16/2015 SLP Individual Time: 5436-0677 SLP Individual Time Calculation (min): 42 min  Short Term Goals: Week 2: SLP Short Term Goal 1 (Week 2): Pt will utilize external memory aids to facilitate recall of new and daily information with supervision verbal cues SLP Short Term Goal 2 (Week 2): Pt will utilize speech intelligibility strategies within structured tasks at the sentence level with supervision question cues SLP Short Term Goal 3 (Week 2): Patient will consume current diet with minimal overt s/s of aspiration with Mod I for use of swallowing compensatory strategies.   Skilled Therapeutic Interventions:  Pt was seen for skilled ST targeting cognitive-linguistic goals.  SLP facilitated the session with min assist faded to supervision verbal cues for use of pacing and increased vocal intensity to achieve intelligibility in personally meaningful, patient directed conversations.  During functional conversations with SLP, pt recalled general procedures for how to make her favorite recipes with min assist question cues; mod assist question cues needed to recall 1-2 main ingredients from recipes.  Pt also recalled location of family visiting area and vending machines with mod I and requested SLP's assistance for transport to visiting area to purchase a small snack.  Pt was returned to room and left with all needs within reach.  Continue per current plan of care.    Function:  Eating Eating                 Cognition Comprehension Comprehension assist level: Understands basic 75 - 89% of the time/ requires cueing 10 - 24% of the time  Expression   Expression assist level: Expresses basic 75 - 89% of the time/requires cueing 10 - 24% of the time. Needs helper to occlude trach/needs to repeat words.  Social Interaction Social Interaction assist  level: Interacts appropriately 75 - 89% of the time - Needs redirection for appropriate language or to initiate interaction.  Problem Solving Problem solving assist level: Solves basic 75 - 89% of the time/requires cueing 10 - 24% of the time  Memory Memory assist level: Recognizes or recalls 50 - 74% of the time/requires cueing 25 - 49% of the time    Pain Pain Assessment Pain Assessment: No/denies pain  Therapy/Group: Individual Therapy  [name], Selinda Orion 08/16/2015, 12:22 PM

## 2015-08-17 ENCOUNTER — Inpatient Hospital Stay (HOSPITAL_COMMUNITY): Payer: Medicare Other | Admitting: Physical Therapy

## 2015-08-17 ENCOUNTER — Inpatient Hospital Stay (HOSPITAL_COMMUNITY): Payer: Medicare Other | Admitting: Speech Pathology

## 2015-08-17 ENCOUNTER — Inpatient Hospital Stay (HOSPITAL_COMMUNITY): Payer: Medicare Other | Admitting: Occupational Therapy

## 2015-08-17 DIAGNOSIS — E875 Hyperkalemia: Secondary | ICD-10-CM

## 2015-08-17 LAB — GLUCOSE, CAPILLARY
GLUCOSE-CAPILLARY: 124 mg/dL — AB (ref 65–99)
GLUCOSE-CAPILLARY: 152 mg/dL — AB (ref 65–99)
Glucose-Capillary: 172 mg/dL — ABNORMAL HIGH (ref 65–99)
Glucose-Capillary: 199 mg/dL — ABNORMAL HIGH (ref 65–99)

## 2015-08-17 LAB — CBC
HCT: 27.8 % — ABNORMAL LOW (ref 36.0–46.0)
Hemoglobin: 8.7 g/dL — ABNORMAL LOW (ref 12.0–15.0)
MCH: 24.6 pg — ABNORMAL LOW (ref 26.0–34.0)
MCHC: 31.3 g/dL (ref 30.0–36.0)
MCV: 78.5 fL (ref 78.0–100.0)
PLATELETS: 225 10*3/uL (ref 150–400)
RBC: 3.54 MIL/uL — ABNORMAL LOW (ref 3.87–5.11)
RDW: 21.4 % — AB (ref 11.5–15.5)
WBC: 8.8 10*3/uL (ref 4.0–10.5)

## 2015-08-17 LAB — BASIC METABOLIC PANEL
Anion gap: 8 (ref 5–15)
BUN: 16 mg/dL (ref 6–20)
CALCIUM: 9.2 mg/dL (ref 8.9–10.3)
CO2: 23 mmol/L (ref 22–32)
CREATININE: 2.91 mg/dL — AB (ref 0.44–1.00)
Chloride: 106 mmol/L (ref 101–111)
GFR calc non Af Amer: 16 mL/min — ABNORMAL LOW (ref >60)
GFR, EST AFRICAN AMERICAN: 19 mL/min — AB (ref >60)
Glucose, Bld: 130 mg/dL — ABNORMAL HIGH (ref 65–99)
Potassium: 5.8 mmol/L — ABNORMAL HIGH (ref 3.5–5.1)
SODIUM: 137 mmol/L (ref 135–145)

## 2015-08-17 NOTE — Progress Notes (Signed)
Subjective/Complaints: Up in w/c. Just finished OT. Appetite better. Slept well. Denies pain. Bowels and bladder moving.  ROS: Denies CP, SOB, N/V/D, anxiety, insomnia, visual issues  Objective: Vital Signs: Blood pressure 151/76, pulse 93, temperature 98.7 F (37.1 C), temperature source Oral, resp. rate 18, height '5\' 7"'  (1.702 m), weight 98.6 kg (217 lb 6 oz), SpO2 97 %. No results found. Results for orders placed or performed during the hospital encounter of 08/06/15 (from the past 72 hour(s))  Glucose, capillary     Status: Abnormal   Collection Time: 08/14/15 11:36 AM  Result Value Ref Range   Glucose-Capillary 179 (H) 65 - 99 mg/dL   Comment 1 Notify RN   Glucose, capillary     Status: Abnormal   Collection Time: 08/14/15  4:19 PM  Result Value Ref Range   Glucose-Capillary 197 (H) 65 - 99 mg/dL   Comment 1 Notify RN   Glucose, capillary     Status: Abnormal   Collection Time: 08/14/15  9:01 PM  Result Value Ref Range   Glucose-Capillary 259 (H) 65 - 99 mg/dL  Glucose, capillary     Status: Abnormal   Collection Time: 08/15/15  6:50 AM  Result Value Ref Range   Glucose-Capillary 147 (H) 65 - 99 mg/dL  Glucose, capillary     Status: Abnormal   Collection Time: 08/15/15 11:53 AM  Result Value Ref Range   Glucose-Capillary 163 (H) 65 - 99 mg/dL  Glucose, capillary     Status: Abnormal   Collection Time: 08/15/15  4:44 PM  Result Value Ref Range   Glucose-Capillary 232 (H) 65 - 99 mg/dL  Glucose, capillary     Status: Abnormal   Collection Time: 08/15/15  9:07 PM  Result Value Ref Range   Glucose-Capillary 289 (H) 65 - 99 mg/dL   Comment 1 Notify RN   Glucose, capillary     Status: None   Collection Time: 08/16/15  7:02 AM  Result Value Ref Range   Glucose-Capillary 98 65 - 99 mg/dL   Comment 1 Notify RN   Glucose, capillary     Status: Abnormal   Collection Time: 08/16/15 11:43 AM  Result Value Ref Range   Glucose-Capillary 298 (H) 65 - 99 mg/dL  Glucose,  capillary     Status: Abnormal   Collection Time: 08/16/15  4:44 PM  Result Value Ref Range   Glucose-Capillary 230 (H) 65 - 99 mg/dL  Glucose, capillary     Status: Abnormal   Collection Time: 08/16/15  9:05 PM  Result Value Ref Range   Glucose-Capillary 104 (H) 65 - 99 mg/dL  CBC     Status: Abnormal   Collection Time: 08/17/15  5:16 AM  Result Value Ref Range   WBC 8.8 4.0 - 10.5 K/uL    Comment: REPEATED TO VERIFY   RBC 3.54 (L) 3.87 - 5.11 MIL/uL   Hemoglobin 8.7 (L) 12.0 - 15.0 g/dL    Comment: REPEATED TO VERIFY   HCT 27.8 (L) 36.0 - 46.0 %   MCV 78.5 78.0 - 100.0 fL   MCH 24.6 (L) 26.0 - 34.0 pg   MCHC 31.3 30.0 - 36.0 g/dL   RDW 21.4 (H) 11.5 - 15.5 %   Platelets 225 150 - 400 K/uL    Comment: REPEATED TO VERIFY  Basic metabolic panel     Status: Abnormal   Collection Time: 08/17/15  5:16 AM  Result Value Ref Range   Sodium 137 135 - 145 mmol/L  Potassium 5.8 (H) 3.5 - 5.1 mmol/L   Chloride 106 101 - 111 mmol/L   CO2 23 22 - 32 mmol/L   Glucose, Bld 130 (H) 65 - 99 mg/dL   BUN 16 6 - 20 mg/dL   Creatinine, Ser 2.91 (H) 0.44 - 1.00 mg/dL   Calcium 9.2 8.9 - 10.3 mg/dL   GFR calc non Af Amer 16 (L) >60 mL/min   GFR calc Af Amer 19 (L) >60 mL/min    Comment: (NOTE) The eGFR has been calculated using the CKD EPI equation. This calculation has not been validated in all clinical situations. eGFR's persistently <60 mL/min signify possible Chronic Kidney Disease.    Anion gap 8 5 - 15  Glucose, capillary     Status: Abnormal   Collection Time: 08/17/15  7:08 AM  Result Value Ref Range   Glucose-Capillary 124 (H) 65 - 99 mg/dL     Gen: NAD. Vital signs reviewed HEENT: Normocephalic, atraumatic Cardio: RRR. Resp: CTA B/L and unlabored GI: BS positive and NT, ND Neuro: Alert.  Oriented to person and place. Left facial droop.   Motor antigravity strength through out, but weaker on left Musc/Skel:  No edema. No tenderness. Skin:   Intact. Warm and  dry.  Assessment/Plan:  1. Functional deficits secondary to acute encephalopathy which require 3+ hours per day of interdisciplinary therapy in a comprehensive inpatient rehab setting. Physiatrist is providing close team supervision and 24 hour management of active medical problems listed below. Physiatrist and rehab team continue to assess barriers to discharge/monitor patient progress toward functional and medical goals. FIM: Function - Bathing Bathing activity did not occur: Refused Position: Wheelchair/chair at sink Body parts bathed by patient: Right arm, Left arm, Chest, Abdomen, Front perineal area, Right upper leg, Left upper leg, Buttocks Body parts bathed by helper: Right lower leg, Left lower leg, Back Assist Level:  (mod A)  Function- Upper Body Dressing/Undressing Upper body dressing/undressing activity did not occur: Refused What is the patient wearing?: Bra, Pull over shirt/dress Bra - Perfomed by patient: Thread/unthread right bra strap, Thread/unthread left bra strap, Hook/unhook bra (pull down sports bra) (assisted to straighten bra) Bra - Perfomed by helper: Hook/unhook bra (pull down sports bra) Pull over shirt/dress - Perfomed by patient: Thread/unthread right sleeve, Thread/unthread left sleeve, Put head through opening, Pull shirt over trunk Pull over shirt/dress - Perfomed by helper: Pull shirt over trunk Assist Level: Supervision or verbal cues Function - Lower Body Dressing/Undressing Lower body dressing/undressing activity did not occur: Refused What is the patient wearing?: Underwear, Pants, Socks, Shoes Position: Wheelchair/chair at [employer] - Performed by patient: Thread/unthread right underwear leg, Thread/unthread left underwear leg Underwear - Performed by helper: Pull underwear up/down Pants- Performed by patient: Thread/unthread right pants leg, Thread/unthread left pants leg Pants- Performed by helper: Pull pants up/down, Fasten/unfasten  pants Non-skid slipper socks- Performed by helper: Don/doff right sock, Don/doff left sock Socks - Performed by helper: Don/doff right sock, Don/doff left sock Shoes - Performed by helper: Don/doff right shoe, Don/doff left shoe, Fasten right, Fasten left Assist for footwear: Dependant Assist for lower body dressing: Touching or steadying assistance (Pt > 75%)  Function - Toileting Toileting activity did not occur: N/A (voiced no need) Toileting steps completed by patient: Performs perineal hygiene, Adjust clothing after toileting Toileting steps completed by helper: Adjust clothing prior to toileting Toileting Assistive Devices: Grab bar or rail Assist level: Touching or steadying assistance (Pt.75%), Supervision or verbal cues  Function - Toilet Transfers  Toilet transfer activity did not occur: N/A (voiced no need) Toilet transfer assistive device: Elevated toilet seat/BSC over toilet, Grab bar Assist level to toilet: Moderate assist (Pt 50 - 74%/lift or lower) Assist level from toilet: Supervision or verbal cues  Function - Chair/bed transfer Chair/bed transfer method: Stand pivot, Ambulatory Chair/bed transfer assist level: Touching or steadying assistance (Pt > 75%) (range from supervision to min assist) Chair/bed transfer assistive device: Armrests, Walker Chair/bed transfer details: Verbal cues for technique, Verbal cues for safe use of DME/AE  Function - Locomotion: Wheelchair Will patient use wheelchair at discharge?: Yes Type: Manual Max wheelchair distance: 70 Assist Level: Supervision or verbal cues Assist Level: Supervision or verbal cues Assist Level: Supervision or verbal cues Turns around,maneuvers to table,bed, and toilet,negotiates 3% grade,maneuvers on rugs and over doorsills: No Function - Locomotion: Ambulation Ambulation activity did not occur: Safety/medical concerns (diarrhea/weakness) Assistive device: Walker-rolling Max distance: 50 Assist level:  Touching or steadying assistance (Pt > 75%) Assist level: Touching or steadying assistance (Pt > 75%) Walk 50 feet with 2 turns activity did not occur: Safety/medical concerns Assist level: Touching or steadying assistance (Pt > 75%) Walk 150 feet activity did not occur: Safety/medical concerns Walk 10 feet on uneven surfaces activity did not occur: Safety/medical concerns  Function - Comprehension Comprehension: Auditory Comprehension assist level: Understands basic 75 - 89% of the time/ requires cueing 10 - 24% of the time  Function - Expression Expression: Verbal Expression assist level: Expresses basic 75 - 89% of the time/requires cueing 10 - 24% of the time. Needs helper to occlude trach/needs to repeat words.  Function - Social Interaction Social Interaction assist level: Interacts appropriately 75 - 89% of the time - Needs redirection for appropriate language or to initiate interaction.  Function - Problem Solving Problem solving assist level: Solves basic 75 - 89% of the time/requires cueing 10 - 24% of the time  Function - Memory Memory assist level: Recognizes or recalls 50 - 74% of the time/requires cueing 25 - 49% of the time Patient normally able to recall (first 3 days only): That he or she is in a hospital, Staff names and faces, Current season  Medical Problem List and Plan: 1. Functional deficits secondary to acute encephalopathy due to urosepsis with prior history of CVA (right pons, basal ganglia infarcts)  -continue CIR therapies 2.  DVT Prophylaxis/Anticoagulation: Subcutaneous heparin indicated. Monitor platelet counts and any signs of bleeding 3. Pain Management: Tylenol as needed 4. Dysphagia. Dysphagia #3 thin liquids. Continued daily clinicaly reassessment               -advanced per SLP             -seems to be handling well 5. Neuropsych: This patient is capable of making decisions on her own behalf. 6. Skin/Wound Care: Routine skin checks 7.  Fluids/Electrolytes/Nutrition: I personally reviewed the patient's labs today.   -hypokalemia, repleted.--now hyperkalemic---dc supplement 8. Acute on chronic renal insufficiency. Latest creatinine 2.91 today   -I personally reviewed the patient's labs today.              -encourage po fluidss 9. Hypertension. bp's have been poorly controlled.Improving 117/80 this a.m.  -increased metoprolol to 36m xr---improved             -utilize prn clonidine for bp> 200/100 10. Hyperlipidemia. Lipitor 11. Diabetes mellitus and peripheral neuropathy. Hemoglobin A1c 6.3. Liberalized diet.  -will follow for pattern at present  -lantus ---decreased to 15 units  -increased  am lantus to 15 units  -Fasting CBG 124 this a.m. 12. Klebsiella/pseudomonas Urosepsis:  cipro d/ced on 11/18 13. Watery stools: Resolved 14. Insomnia:  No issues off trazodone 15. Anemia: hgb 8.7 today  -heme check stools  -recheck cbc in am---gradual trend downward over last 2 weeks  LOS (Days) 11 A FACE TO FACE EVALUATION WAS PERFORMED  Shanikka Wonders T 08/17/2015, 8:57 AM

## 2015-08-17 NOTE — Progress Notes (Signed)
Occupational Therapy Session Note  Patient Details  Name: Angelica Beck MRN: [id number] Date of Birth: 04/14/1951  Today's Date: 08/17/2015 OT Individual Time: 5465-6812 OT Individual Time Calculation (min): 86 min    Short Term Goals: Week 2:  OT Short Term Goal 1 (Week 2): Pt will perform bathing tasks with mod A with sit<>stand at sink OT Short Term Goal 2 (Week 2): Pt will perform LB dressing tasks with mod A with sit<>stand from w/c at sink OT Short Term Goal 3 (Week 2): Pt will perform toileting tasks with mod A OT Short Term Goal 4 (Week 2): Pt will maintain standing balance with supervision for 1 minute while completing bathing/dressing tasks  Skilled Therapeutic Interventions/Progress Updates:  Upon entering the room, pt seated in wheelchair with no c/o pain. Pt propelled chair with B LEs 150' towards elevator. OT assisted pt via wheelchair and total A for energy conservation the rest of the way to gift shop. Pt propelled wheelchair on carpeted surface with min A as needed when infrequently hitting obstacles. Pt reaching from wheelchair in all directions to obtain items from shelves. Pt returning to inpatient rehab with assist from therapist secondary to fatigue. Pt propelled self another 100' towards family room where she made hot chocolate from wheelchair level with min verbal cues to locate items on table. OT assisted pt back to room where she ambulated 25' with RW to doorway and back with min A for safety. Pt performed wheelchair push ups with 2 reps of 3 with supervision and focus on controlled sitting. Pt required rest break after 3 reps. Pt remained seated in wheelchair at end of session with call bell within reach upon exiting the room.   Therapy Documentation Precautions:  Precautions Precautions: Fall Restrictions Weight Bearing Restrictions: No General:   Vital Signs: Therapy Vitals Temp: 99 F (37.2 C) Temp Source: Oral Pulse Rate: 89 Resp: 18 BP: 128/84  mmHg Patient Position (if appropriate): Sitting Oxygen Therapy SpO2: 97 % O2 Device: Not Delivered Pain: Pain Assessment Pain Assessment: No/denies pain Pain Score: 6  Pain Location: Knee Pain Orientation: Right Pain Intervention(s): Emotional support;Repositioned;RN made aware  See Function Navigator for Current Functional Status.   Therapy/Group: Individual Therapy  Phineas Semen 08/17/2015, 2:42 PM

## 2015-08-17 NOTE — Progress Notes (Signed)
Speech Language Pathology Daily Session Note  Patient Details  Name: Angelica Beck MRN: [id number] Date of Birth: November 24, 1950  Today's Date: 08/17/2015 SLP Individual Time: 1000-1100 SLP Individual Time Calculation (min): 60 min  Short Term Goals: Week 2: SLP Short Term Goal 1 (Week 2): Pt will utilize external memory aids to facilitate recall of new and daily information with supervision verbal cues SLP Short Term Goal 2 (Week 2): Pt will utilize speech intelligibility strategies within structured tasks at the sentence level with supervision question cues SLP Short Term Goal 3 (Week 2): Patient will consume current diet with minimal overt s/s of aspiration with Mod I for use of swallowing compensatory strategies.   Skilled Therapeutic Interventions: Skilled treatment session focused on speech and cognitive goals. Upon arrival, patient was sitting upright in the wheelchair. Patient requested to donn clothes and SLP provided extra time and Min A verbal cues for safety and problem solving with task. SLP also facilitated session by providing Mod A verbal, visual and question cues for functional problem solving during a 4 step picture sequencing task and was 100% intelligible at the sentence level with Mod I during verbal description task.  Patient independently requested to use the bathroom and required Mod A verbal cues to complete task safely. Patient left upright in wheelchair with all needs within reach. Continue with current plan of care.    Function:  Cognition Comprehension Comprehension assist level: Understands basic 90% of the time/cues < 10% of the time  Expression   Expression assist level: Expresses basic 90% of the time/requires cueing < 10% of the time.  Social Interaction Social Interaction assist level: Interacts appropriately 75 - 89% of the time - Needs redirection for appropriate language or to initiate interaction.  Problem Solving Problem solving assist level: Solves basic  75 - 89% of the time/requires cueing 10 - 24% of the time  Memory Memory assist level: Recognizes or recalls 50 - 74% of the time/requires cueing 25 - 49% of the time    Pain Pain Assessment Pain Assessment: No/denies pain   Therapy/Group: Individual Therapy  Angelica Beck 08/17/2015, 2:11 PM

## 2015-08-17 NOTE — Progress Notes (Signed)
Physical Therapy Session Note  Patient Details  Name: Angelica Beck MRN: [id number] Date of Birth: 06-11-1951  Today's Date: 08/17/2015 PT Individual Time: 1100-1153 PT Individual Time Calculation (min): 53 min   Short Term Goals: Week 2:  PT Short Term Goal 1 (Week 2): =LTGs due to ELOS  Skilled Therapeutic Interventions/Progress Updates:   Handoff from SLP in therapy gym.  Pt reporting increased pain in R knee today, states she has arthritis but the pain will go away on its own.  PT instructed patient in TUG with extended rest breaks in between trials due to fatigue and R knee pain.  PT explained purpose of TUG, results, and interpretation of results and patient verbalized understanding.  PT instructed patient in ~9 min on Kinetron in w/c position at 30 cm/sec in 3 min intervals with rest breaks in between for pain control.  Pt reporting increased pain in knee and requesting to return to room.  Pt propelled w/c back to room x150' using a combination of BLEs and pulling on the rail along the hallway to advance w/c.  Pt positioned sitting in w/c with call bell in reach and needs met.  RN notified of patients knee pain and that she currently did not want any ice/heat/medication.    Therapy Documentation Precautions:  Precautions Precautions: Fall Restrictions Weight Bearing Restrictions: No Pain: Pain Assessment Pain Assessment: 0-10 Pain Score: 6  Pain Location: Knee Pain Orientation: Right Pain Intervention(s): Emotional support;Repositioned;RN made aware  Balance: Balance Balance Assessed: Yes Standardized Balance Assessment Standardized Balance Assessment: Timed Up and Go Test Timed Up and Go Test TUG: Normal TUG Normal TUG (seconds): 71.95   See Function Navigator for Current Functional Status.   Therapy/Group: Individual Therapy  Earnest Conroy Penven-Crew 08/17/2015, 12:08 PM

## 2015-08-18 ENCOUNTER — Inpatient Hospital Stay (HOSPITAL_COMMUNITY): Payer: Medicare Other | Admitting: Physical Therapy

## 2015-08-18 ENCOUNTER — Inpatient Hospital Stay (HOSPITAL_COMMUNITY): Payer: Medicare Other | Admitting: Speech Pathology

## 2015-08-18 ENCOUNTER — Inpatient Hospital Stay (HOSPITAL_COMMUNITY): Payer: Medicare Other | Admitting: Occupational Therapy

## 2015-08-18 LAB — CBC
HEMATOCRIT: 28.4 % — AB (ref 36.0–46.0)
HEMOGLOBIN: 9.1 g/dL — AB (ref 12.0–15.0)
MCH: 25.3 pg — ABNORMAL LOW (ref 26.0–34.0)
MCHC: 32 g/dL (ref 30.0–36.0)
MCV: 79.1 fL (ref 78.0–100.0)
Platelets: 204 10*3/uL (ref 150–400)
RBC: 3.59 MIL/uL — AB (ref 3.87–5.11)
RDW: 21.5 % — ABNORMAL HIGH (ref 11.5–15.5)
WBC: 9.3 10*3/uL (ref 4.0–10.5)

## 2015-08-18 LAB — GLUCOSE, CAPILLARY
GLUCOSE-CAPILLARY: 117 mg/dL — AB (ref 65–99)
GLUCOSE-CAPILLARY: 172 mg/dL — AB (ref 65–99)
GLUCOSE-CAPILLARY: 213 mg/dL — AB (ref 65–99)
GLUCOSE-CAPILLARY: 178 mg/dL — AB (ref 65–99)
Glucose-Capillary: 192 mg/dL — ABNORMAL HIGH (ref 65–99)

## 2015-08-18 LAB — BASIC METABOLIC PANEL
Anion gap: 11 (ref 5–15)
BUN: 16 mg/dL (ref 6–20)
CHLORIDE: 104 mmol/L (ref 101–111)
CO2: 20 mmol/L — AB (ref 22–32)
Calcium: 9.6 mg/dL (ref 8.9–10.3)
Creatinine, Ser: 2.78 mg/dL — ABNORMAL HIGH (ref 0.44–1.00)
GFR calc non Af Amer: 17 mL/min — ABNORMAL LOW (ref >60)
GFR, EST AFRICAN AMERICAN: 20 mL/min — AB (ref >60)
Glucose, Bld: 123 mg/dL — ABNORMAL HIGH (ref 65–99)
POTASSIUM: 5.8 mmol/L — AB (ref 3.5–5.1)
SODIUM: 135 mmol/L (ref 135–145)

## 2015-08-18 NOTE — Progress Notes (Signed)
Subjective/Complaints: No problems overnight. Eating breakfast. In good spirits  ROS: Denies CP, SOB, N/V/D, anxiety, insomnia, visual issues  Objective: Vital Signs: Blood pressure 110/70, pulse 119, temperature 98.5 F (36.9 C), temperature source Oral, resp. rate 18, height 5' 7" (1.702 m), weight 97.7 kg (215 lb 6.2 oz), SpO2 98 %. No results found. Results for orders placed or performed during the hospital encounter of 08/06/15 (from the past 72 hour(s))  Glucose, capillary     Status: Abnormal   Collection Time: 08/15/15 11:53 AM  Result Value Ref Range   Glucose-Capillary 163 (H) 65 - 99 mg/dL  Glucose, capillary     Status: Abnormal   Collection Time: 08/15/15  4:44 PM  Result Value Ref Range   Glucose-Capillary 232 (H) 65 - 99 mg/dL  Glucose, capillary     Status: Abnormal   Collection Time: 08/15/15  9:07 PM  Result Value Ref Range   Glucose-Capillary 289 (H) 65 - 99 mg/dL   Comment 1 Notify RN   Glucose, capillary     Status: None   Collection Time: 08/16/15  7:02 AM  Result Value Ref Range   Glucose-Capillary 98 65 - 99 mg/dL   Comment 1 Notify RN   Glucose, capillary     Status: Abnormal   Collection Time: 08/16/15 11:43 AM  Result Value Ref Range   Glucose-Capillary 298 (H) 65 - 99 mg/dL  Glucose, capillary     Status: Abnormal   Collection Time: 08/16/15  4:44 PM  Result Value Ref Range   Glucose-Capillary 230 (H) 65 - 99 mg/dL  Glucose, capillary     Status: Abnormal   Collection Time: 08/16/15  9:05 PM  Result Value Ref Range   Glucose-Capillary 104 (H) 65 - 99 mg/dL  CBC     Status: Abnormal   Collection Time: 08/17/15  5:16 AM  Result Value Ref Range   WBC 8.8 4.0 - 10.5 K/uL    Comment: REPEATED TO VERIFY   RBC 3.54 (L) 3.87 - 5.11 MIL/uL   Hemoglobin 8.7 (L) 12.0 - 15.0 g/dL    Comment: REPEATED TO VERIFY   HCT 27.8 (L) 36.0 - 46.0 %   MCV 78.5 78.0 - 100.0 fL   MCH 24.6 (L) 26.0 - 34.0 pg   MCHC 31.3 30.0 - 36.0 g/dL   RDW 21.4 (H) 11.5 -  15.5 %   Platelets 225 150 - 400 K/uL    Comment: REPEATED TO VERIFY  Basic metabolic panel     Status: Abnormal   Collection Time: 08/17/15  5:16 AM  Result Value Ref Range   Sodium 137 135 - 145 mmol/L   Potassium 5.8 (H) 3.5 - 5.1 mmol/L   Chloride 106 101 - 111 mmol/L   CO2 23 22 - 32 mmol/L   Glucose, Bld 130 (H) 65 - 99 mg/dL   BUN 16 6 - 20 mg/dL   Creatinine, Ser 2.91 (H) 0.44 - 1.00 mg/dL   Calcium 9.2 8.9 - 10.3 mg/dL   GFR calc non Af Amer 16 (L) >60 mL/min   GFR calc Af Amer 19 (L) >60 mL/min    Comment: (NOTE) The eGFR has been calculated using the CKD EPI equation. This calculation has not been validated in all clinical situations. eGFR's persistently <60 mL/min signify possible Chronic Kidney Disease.    Anion gap 8 5 - 15  Glucose, capillary     Status: Abnormal   Collection Time: 08/17/15  7:08 AM  Result Value Ref Range  Glucose-Capillary 124 (H) 65 - 99 mg/dL  Glucose, capillary     Status: Abnormal   Collection Time: 08/17/15 11:48 AM  Result Value Ref Range   Glucose-Capillary 152 (H) 65 - 99 mg/dL  Glucose, capillary     Status: Abnormal   Collection Time: 08/17/15  4:43 PM  Result Value Ref Range   Glucose-Capillary 172 (H) 65 - 99 mg/dL  Glucose, capillary     Status: Abnormal   Collection Time: 08/17/15  8:40 PM  Result Value Ref Range   Glucose-Capillary 199 (H) 65 - 99 mg/dL  CBC     Status: Abnormal   Collection Time: 08/18/15  6:00 AM  Result Value Ref Range   WBC 9.3 4.0 - 10.5 K/uL   RBC 3.59 (L) 3.87 - 5.11 MIL/uL   Hemoglobin 9.1 (L) 12.0 - 15.0 g/dL   HCT 28.4 (L) 36.0 - 46.0 %   MCV 79.1 78.0 - 100.0 fL   MCH 25.3 (L) 26.0 - 34.0 pg   MCHC 32.0 30.0 - 36.0 g/dL   RDW 21.5 (H) 11.5 - 15.5 %   Platelets 204 150 - 400 K/uL  Basic metabolic panel     Status: Abnormal   Collection Time: 08/18/15  6:00 AM  Result Value Ref Range   Sodium 135 135 - 145 mmol/L   Potassium 5.8 (H) 3.5 - 5.1 mmol/L   Chloride 104 101 - 111 mmol/L    CO2 20 (L) 22 - 32 mmol/L   Glucose, Bld 123 (H) 65 - 99 mg/dL   BUN 16 6 - 20 mg/dL   Creatinine, Ser 2.78 (H) 0.44 - 1.00 mg/dL   Calcium 9.6 8.9 - 10.3 mg/dL   GFR calc non Af Amer 17 (L) >60 mL/min   GFR calc Af Amer 20 (L) >60 mL/min    Comment: (NOTE) The eGFR has been calculated using the CKD EPI equation. This calculation has not been validated in all clinical situations. eGFR's persistently <60 mL/min signify possible Chronic Kidney Disease.    Anion gap 11 5 - 15  Glucose, capillary     Status: Abnormal   Collection Time: 08/18/15  6:53 AM  Result Value Ref Range   Glucose-Capillary 117 (H) 65 - 99 mg/dL     Gen: NAD. Vital signs reviewed HEENT: Normocephalic, atraumatic Cardio: RRR. Resp: CTA B/L and unlabored GI: BS positive and NT, ND Neuro: Alert.  Oriented to person and place. Left facial droop.   Motor antigravity strength through out, but weaker on left Musc/Skel:  No edema. No tenderness. Skin:   Intact. Warm and dry.  Assessment/Plan:  1. Functional deficits secondary to acute encephalopathy which require 3+ hours per day of interdisciplinary therapy in a comprehensive inpatient rehab setting. Physiatrist is providing close team supervision and 24 hour management of active medical problems listed below. Physiatrist and rehab team continue to assess barriers to discharge/monitor patient progress toward functional and medical goals. FIM: Function - Bathing Bathing activity did not occur: Refused Position: Wheelchair/chair at sink Body parts bathed by patient: Right arm, Left arm, Chest, Abdomen, Front perineal area, Right upper leg, Left upper leg Body parts bathed by helper: Right lower leg, Left lower leg, Back, Buttocks Assist Level:  (mod)  Function- Upper Body Dressing/Undressing Upper body dressing/undressing activity did not occur: Refused What is the patient wearing?: Pull over shirt/dress Bra - Perfomed by patient: Thread/unthread right bra  strap, Thread/unthread left bra strap, Hook/unhook bra (pull down sports bra) (assisted to straighten bra)  Bra - Perfomed by helper: Hook/unhook bra (pull down sports bra) Pull over shirt/dress - Perfomed by patient: Thread/unthread right sleeve, Thread/unthread left sleeve, Put head through opening, Pull shirt over trunk Pull over shirt/dress - Perfomed by helper: Pull shirt over trunk Assist Level: Set up Set up : To obtain clothing/put away Function - Lower Body Dressing/Undressing Lower body dressing/undressing activity did not occur: Refused What is the patient wearing?: Underwear, Pants, Socks, Shoes Position: Wheelchair/chair at sink Underwear - Performed by patient: Thread/unthread right underwear leg Underwear - Performed by helper: Thread/unthread left underwear leg, Pull underwear up/down Pants- Performed by patient: Thread/unthread right pants leg Pants- Performed by helper: Thread/unthread left pants leg, Pull pants up/down Non-skid slipper socks- Performed by helper: Don/doff right sock, Don/doff left sock Socks - Performed by helper: Don/doff right sock, Don/doff left sock Shoes - Performed by helper: Don/doff right shoe, Don/doff left shoe, Fasten right, Fasten left Assist for footwear: Dependant Assist for lower body dressing:  (max a)  Function - Toileting Toileting activity did not occur: N/A (voiced no need) Toileting steps completed by patient: Performs perineal hygiene Toileting steps completed by helper: Adjust clothing after toileting, Adjust clothing prior to toileting Toileting Assistive Devices: Grab bar or rail Assist level:  (max A)  Function - Air cabin crew transfer activity did not occur: N/A (voiced no need) Toilet transfer assistive device: Elevated toilet seat/BSC over toilet, Grab bar Assist level to toilet: Supervision or verbal cues Assist level from toilet: Supervision or verbal cues  Function - Chair/bed transfer Chair/bed transfer  method: Ambulatory Chair/bed transfer assist level: Moderate assist (Pt 50 - 74%/lift or lower) Chair/bed transfer assistive device: Armrests, Walker Chair/bed transfer details: Verbal cues for technique, Verbal cues for safe use of DME/AE  Function - Locomotion: Wheelchair Will patient use wheelchair at discharge?: Yes Type: Manual Max wheelchair distance: 150 Assist Level: Supervision or verbal cues Assist Level: Supervision or verbal cues Assist Level: Supervision or verbal cues Turns around,maneuvers to table,bed, and toilet,negotiates 3% grade,maneuvers on rugs and over doorsills: No Function - Locomotion: Ambulation Ambulation activity did not occur: Safety/medical concerns (diarrhea/weakness) Assistive device: Walker-rolling Max distance: 25 Assist level: Touching or steadying assistance (Pt > 75%) Assist level: Touching or steadying assistance (Pt > 75%) Walk 50 feet with 2 turns activity did not occur: Safety/medical concerns Assist level: Touching or steadying assistance (Pt > 75%) Walk 150 feet activity did not occur: Safety/medical concerns Walk 10 feet on uneven surfaces activity did not occur: Safety/medical concerns  Function - Comprehension Comprehension: Auditory Comprehension assist level: Understands basic 90% of the time/cues < 10% of the time  Function - Expression Expression: Verbal Expression assist level: Expresses basic 90% of the time/requires cueing < 10% of the time.  Function - Social Interaction Social Interaction assist level: Interacts appropriately 75 - 89% of the time - Needs redirection for appropriate language or to initiate interaction.  Function - Problem Solving Problem solving assist level: Solves basic 75 - 89% of the time/requires cueing 10 - 24% of the time  Function - Memory Memory assist level: Recognizes or recalls 50 - 74% of the time/requires cueing 25 - 49% of the time Patient normally able to recall (first 3 days only): That he  or she is in a hospital, Staff names and faces, Current season  Medical Problem List and Plan: 1. Functional deficits secondary to acute encephalopathy due to urosepsis with prior history of CVA (right pons, basal ganglia infarcts)  -continue CIR therapies 2.  DVT Prophylaxis/Anticoagulation: Subcutaneous heparin indicated. Monitor platelet counts and any signs of bleeding 3. Pain Management: Tylenol as needed 4. Dysphagia. Dysphagia #3 thin liquids. Continued daily clinicaly reassessment                5. Neuropsych: This patient is capable of making decisions on her own behalf. 6. Skin/Wound Care: Routine skin checks 7. Fluids/Electrolytes/Nutrition: I personally reviewed the patient's labs today.   -hypokalemia, repleted.--now hyperkalemic---dc supplement 8. Acute on chronic renal insufficiency. Latest creatinine 2.78 today   -I personally reviewed the patient's labs today.              -encourage po fluids 9. Hypertension. bp's have been poorly controlled.Improving 117/80 this a.m.  -increased metoprolol to 39m xr---improved             -utilize prn clonidine for bp> 200/100 10. Hyperlipidemia. Lipitor 11. Diabetes mellitus and peripheral neuropathy. Hemoglobin A1c 6.3. Liberalized diet.  -lantus ---decreased to 15 units  -increased am lantus to 15 units--better control overall  -Fasting CBG 117 this a.m. 12. Klebsiella/pseudomonas Urosepsis:  cipro d/ced on 11/18 13. Watery stools: Resolved 14. Insomnia:  No issues off trazodone 15. Anemia: hgb up to 9.1  today  -heme check stools---not done yet    LOS (Days) 12 A FACE TO FACE EVALUATION WAS PERFORMED  Divine Imber T 08/18/2015, 9:08 AM

## 2015-08-18 NOTE — Progress Notes (Signed)
Occupational Therapy Session Note  Patient Details  Name: Angelica Beck MRN: [id number] Date of Birth: Jan 28, 1951  Today's Date: 08/18/2015 OT Individual Time: 7628-3151 OT Individual Time Calculation (min): 59 min    Short Term Goals: Week 2:  OT Short Term Goal 1 (Week 2): Pt will perform bathing tasks with mod A with sit<>stand at sink OT Short Term Goal 2 (Week 2): Pt will perform LB dressing tasks with mod A with sit<>stand from w/c at sink OT Short Term Goal 3 (Week 2): Pt will perform toileting tasks with mod A OT Short Term Goal 4 (Week 2): Pt will maintain standing balance with supervision for 1 minute while completing bathing/dressing tasks  Skilled Therapeutic Interventions/Progress Updates:  Upon entering the room, pt supine in bed with 5/10 c/o pain in R knee. Pt soiled in bed with BM as therapist entering the room. Pt unaware that she had been incontinent. Supine > EOB with max A. Pt requiring lifting assistance for stand pivot transfer into wheelchair. Pt standing at sink side for hygiene with assistance from therapist to wash buttocks and pt able to wash peri area. Pt standing for 4 minutes while performing hygiene and requiring rest break secondary to fatigue. Pt completing UB bathing while seated in wheelchair at sink with set up A. LB dressing with max A as pt was only able to thread clothing over R LE. Pt refusing to assist in pulling pants up secondary to her stating, "My aide helps at home." OT educating pt on OT goals and increased independence. Pt verbalized understanding and seated in wheelchair at end of session with call bell and all needed items within reach upon exiting the room.   Therapy Documentation Precautions:  Precautions Precautions: Fall Restrictions Weight Bearing Restrictions: No General:   Vital Signs: Therapy Vitals Pulse Rate: (!) 119 BP: 110/70 mmHg  See Function Navigator for Current Functional Status.   Therapy/Group: Individual  Therapy  Phineas Semen 08/18/2015, 9:01 AM

## 2015-08-18 NOTE — Progress Notes (Signed)
Social Work Patient ID: Angelica Beck, female   DOB: 1951-04-10, 64 y.o.   MRN: [id number]   Have reviewed team conference with pt and daughter and both feeling ready for d/c next week.  Have scheduled family education to take place with daughter on Fri 11/25 8-10am.  Continue to follow.  Angelica Rouser, LCSW

## 2015-08-18 NOTE — Patient Care Conference (Signed)
Inpatient RehabilitationTeam Conference and Plan of Care Update Date: 08/17/2015   Time: 2:35 PM    Patient Name: Genelle Economou      Medical Record Number: [id number]  Date of Birth: 1951/01/27 Sex: Female         Room/Bed: 4W21C/4W21C-01 Payor Info: Payor: MEDICARE / Plan: MEDICARE PART A AND B / Product Type: *No Product type* /    Admitting Diagnosis: ENCEPHALOPATHY SEPSIS  Admit Date/Time:  08/06/2015  4:44 PM Admission Comments: No comment available   Primary Diagnosis:  Acute encephalopathy Principal Problem: Acute encephalopathy  Patient Active Problem List   Diagnosis Date Noted  . Essential hypertension   . Bacterial UTI 08/06/2015  . Sepsis ([place]) 08/06/2015  . Altered mental status   . Lactic acidosis 08/02/2015  . Acute encephalopathy 08/02/2015  . History of CVA (cerebrovascular accident) 08/02/2015  . Chronic systolic HF (heart failure) ([place]) 08/02/2015  . Cognitive impairment 08/02/2015  . AKI (acute kidney injury) ([place]) 02/08/2015  . Abnormality of gait 02/08/2015  . Hyperthyroidism 02/08/2015  . Type 2 diabetes mellitus ([place]) 02/08/2015  . Chest pain 01/19/2014  . Obesity hypoventilation syndrome ([place]) 06/20/2013  . HYPERTHYROIDISM 06/28/2009  . DIAB W/UNS COMP TYPE II/UNS NOT STATED UNCNTRL 06/28/2009  . HYPERLIPIDEMIA-MIXED 06/28/2009  . OVERWEIGHT/OBESITY 06/28/2009  . HYPERTENSION, BENIGN 06/28/2009  . CAD, NATIVE VESSEL 06/28/2009  . DYSPNEA 06/28/2009    Expected Discharge Date: Expected Discharge Date: 08/24/15  Team Members Present: Physician leading conference: Dr. Alger Simons Social Worker Present: Lennart Pall, LCSW Nurse Present: Heather Roberts, RN PT Present: Dwyane Dee, Lacy Duverney, PT OT Present: Willeen Cass, Rhetta Mura, OT;Roanna Epley, COTA SLP Present: Weston Anna, SLP PPS Coordinator present : Daiva Nakayama, RN, [facility]     Current Status/Progress Goal Weekly Team Focus  Medical   improved engagement, sleep  improved. appetite beter  see prior, improved safety awareness  continued nutrition, strengthening   Bowel/Bladder   occasional incontinence . toileting during the day , incont  at hs . LBM 11-23. continent bowel          Swallow/Nutrition/ Hydration   Dys. 3 textures with thin liquids, Mod I  Mod I  all goals met    ADL's   supervision -grooming, UB self care - set up A, LB self care with max A, supervision sit <>stand,  min A functional mobility  supervision - mod A  self care retraining, functional transfers, pt/family educ, balance   Mobility   supervision/steady assist transfers, mod I w/c, min/mod assist gait with RW short distances  supervision overall, short distance amb with min assist  gait training, LE strengthening, balance   Communication   Supervision-Min A   Min A   increased use of speech intelligibility strategies    Safety/Cognition/ Behavioral Observations  Min-Mod A  Min A   recall of new information, functional problem solving    Pain   n/a         Skin   MASD to bilateral groin folds, barrier cream and MGP applied as needed  no new breakdown   educate patient and family on skin care       *See Care Plan and progress notes for long and short-term goals.  Barriers to Discharge: see prior    Possible Resolutions to Barriers:  family ed, NMR    Discharge Planning/Teaching Needs:  Plan home with family and CAP services able to cover close to 24 hrs, however, will be 2-3 hours in morning that pt  is alone.  Tx team aware.  Still plan for d/c home.  Teaching ongoing.   Team Discussion:  Slight anemia - monitor.  Supervision for all transfers;  Ambulation with min assist.  tx to clarify with family her baseline function.  To remain on D3 diet at home.  Making good progress and on track for d/c early next week.  Revisions to Treatment Plan:  None   Continued Need for Acute Rehabilitation Level of Care: The patient requires daily medical management by a physician  with specialized training in physical medicine and rehabilitation for the following conditions: Daily direction of a multidisciplinary physical rehabilitation program to ensure safe treatment while eliciting the highest outcome that is of practical value to the patient.: Yes Daily medical management of patient stability for increased activity during participation in an intensive rehabilitation regime.: Yes Daily analysis of laboratory values and/or radiology reports with any subsequent need for medication adjustment of medical intervention for : Neurological problems;Other  Tayte Mcwherter 08/18/2015, 4:39 PM

## 2015-08-18 NOTE — Progress Notes (Signed)
Physical Therapy Session Note  Patient Details  Name: Angelica Beck MRN: [id number] Date of Birth: Jan 14, 1951  Today's Date: 08/18/2015 PT Individual Time: 3754-3606 PT Individual Time Calculation (min): 55 min   Short Term Goals: Week 2:  PT Short Term Goal 1 (Week 2): =LTGs due to ELOS  Skilled Therapeutic Interventions/Progress Updates:    Session 1: Pt received resting in w/c and agreeable to therapy session. No reports of pain at start of session, but after w/c propulsion using BLEs x100' pt noted to be in increased discomfort and confirming pain in R knee.  PT instructed patient in 7 min of Kinetron at 25-30 cm/s for L NMR, strengthening, and endurance.  Pt performed stand/pivot transfer from w/c>therapy mat with RW requiring increased time and supervision with verbal cues for scooting forward in w/c.  PT instructed patient in standing balance task with RW and completing simple sorting task but pt with increased discomfort in R knee.  Transitioned to simple sorting task in sitting EOM with no trunk, UE, or LE support x10 minutes with verbal cues for progression of task.  Pt attempted amb with RW and steady assist x6' but reporting pain in R knee too great to continue so PT transported pt back to room in w/c.  RN alerted to pain.  PT discussed d/c options with patient if R knee bothers her at home and pt states she thinks her w/c will fit through the majority of her home and if not her aid will help her.  Pt positioned with call bell in reach and needs met.   Therapy Documentation Precautions:  Precautions Precautions: Fall Restrictions Weight Bearing Restrictions: No Pain: Pain Assessment Pain Assessment: Faces Faces Pain Scale: Hurts even more Pain Location: Knee Pain Orientation: Right Pain Intervention(s): RN made aware;Repositioned;Emotional support   See Function Navigator for Current Functional Status.   Therapy/Group: Individual Therapy  Earnest Conroy  [place]ven[place]Crew 08/18/2015, 12:02 PM

## 2015-08-18 NOTE — Progress Notes (Signed)
Physical Therapy Session Note  Patient Details  Name: Angelica Beck MRN: [id number] Date of Birth: 09-24-1951  Today's Date: 08/18/2015 PT Individual Time: 3734-2876 PT Individual Time Calculation (min): 28 min   Short Term Goals: Week 2:  PT Short Term Goal 1 (Week 2): =LTGs due to ELOS  Skilled Therapeutic Interventions/Progress Updates:   Session 2: Pt received resting in w/c and agreeable to therapy session.  Pt propelled w/c x100' +150' with BLEs and rest breaks as needed for R knee pain.  PT instructed patient in BUE exercise x15 reps using 4# weighted bar: elbow flexion, chest press, ball taps (3x15), and using level 2 theraband for seated rows and tricep pull downs.  Pt returned to room in w/c at end of session and positioned with call bell in reach and needs met.    Therapy Documentation Precautions:  Precautions Precautions: Fall Restrictions Weight Bearing Restrictions: No Pain: Pain Assessment Pain Assessment: Faces Pain Score: 4  Faces Pain Scale: Hurts little more Pain Location: Knee Pain Orientation: Right Pain Intervention(s): Emotional support;Distraction   See Function Navigator for Current Functional Status.   Therapy/Group: Individual Therapy  Earnest Conroy Penven-Crew 08/18/2015, 3:51 PM

## 2015-08-18 NOTE — Progress Notes (Signed)
Speech Language Pathology Daily Session Note  Patient Details  Name: Angelica Beck MRN: [id number] Date of Birth: 13-May-1951  Today's Date: 08/18/2015 SLP Individual Time: 1000-1100 SLP Individual Time Calculation (min): 60 min  Short Term Goals: Week 2: SLP Short Term Goal 1 (Week 2): Pt will utilize external memory aids to facilitate recall of new and daily information with supervision verbal cues SLP Short Term Goal 2 (Week 2): Pt will utilize speech intelligibility strategies within structured tasks at the sentence level with supervision question cues SLP Short Term Goal 3 (Week 2): Patient will consume current diet with minimal overt s/s of aspiration with Mod I for use of swallowing compensatory strategies.   Skilled Therapeutic Interventions: Skilled treatment session focused on cognitive and speech goals.  SLP facilitated session by providing Mod A semantic and verbal cues for recall during a novel, structured task and to generate items within a specific category.  SLP also provided supervision verbal cues for use of speech intelligibility strategies at the phrase and sentence level in a moderately noisy environment to achieve intelligibility of 90%. Patient propelled herself to and from the office with extra time and Min A verbal cues to navigate obstacles. Patient left upright in wheelchair with all needs within reach. Continue with current plan of care.    Function:  Cognition Comprehension Comprehension assist level: Understands basic 90% of the time/cues < 10% of the time  Expression   Expression assist level: Expresses basic 90% of the time/requires cueing < 10% of the time.  Social Interaction Social Interaction assist level: Interacts appropriately 75 - 89% of the time - Needs redirection for appropriate language or to initiate interaction.  Problem Solving Problem solving assist level: Solves basic 75 - 89% of the time/requires cueing 10 - 24% of the time  Memory Memory  assist level: Recognizes or recalls 50 - 74% of the time/requires cueing 25 - 49% of the time    Pain Pain Assessment Pain Assessment: No/denies pain  Therapy/Group: Individual Therapy  Orazio Weller 08/18/2015, 12:37 PM

## 2015-08-19 LAB — GLUCOSE, CAPILLARY
GLUCOSE-CAPILLARY: 125 mg/dL — AB (ref 65–99)
GLUCOSE-CAPILLARY: 144 mg/dL — AB (ref 65–99)
GLUCOSE-CAPILLARY: 153 mg/dL — AB (ref 65–99)
Glucose-Capillary: 136 mg/dL — ABNORMAL HIGH (ref 65–99)

## 2015-08-19 MED ORDER — INSULIN GLARGINE 100 UNIT/ML ~~LOC~~ SOLN
20.0000 [IU] | Freq: Every day | SUBCUTANEOUS | Status: DC
  Administered 2015-08-19 – 2015-08-24 (×6): 20 [IU] via SUBCUTANEOUS
  Filled 2015-08-19 (×12): qty 0.2

## 2015-08-19 NOTE — Progress Notes (Signed)
Subjective/Complaints: No problems overnight. Expecting family to visit today.   ROS: Denies CP, SOB, N/V/D, anxiety, insomnia, visual issues  Objective: Vital Signs: Blood pressure 124/71, pulse 86, temperature 98.6 F (37 C), temperature source Oral, resp. rate 18, height '5\' 7"'  (1.702 m), weight 98.6 kg (217 lb 6 oz), SpO2 100 %. No results found. Results for orders placed or performed during the hospital encounter of 08/06/15 (from the past 72 hour(s))  Glucose, capillary     Status: Abnormal   Collection Time: 08/16/15 11:43 AM  Result Value Ref Range   Glucose-Capillary 298 (H) 65 - 99 mg/dL  Glucose, capillary     Status: Abnormal   Collection Time: 08/16/15  4:44 PM  Result Value Ref Range   Glucose-Capillary 230 (H) 65 - 99 mg/dL  Glucose, capillary     Status: Abnormal   Collection Time: 08/16/15  9:05 PM  Result Value Ref Range   Glucose-Capillary 104 (H) 65 - 99 mg/dL  CBC     Status: Abnormal   Collection Time: 08/17/15  5:16 AM  Result Value Ref Range   WBC 8.8 4.0 - 10.5 K/uL    Comment: REPEATED TO VERIFY   RBC 3.54 (L) 3.87 - 5.11 MIL/uL   Hemoglobin 8.7 (L) 12.0 - 15.0 g/dL    Comment: REPEATED TO VERIFY   HCT 27.8 (L) 36.0 - 46.0 %   MCV 78.5 78.0 - 100.0 fL   MCH 24.6 (L) 26.0 - 34.0 pg   MCHC 31.3 30.0 - 36.0 g/dL   RDW 21.4 (H) 11.5 - 15.5 %   Platelets 225 150 - 400 K/uL    Comment: REPEATED TO VERIFY  Basic metabolic panel     Status: Abnormal   Collection Time: 08/17/15  5:16 AM  Result Value Ref Range   Sodium 137 135 - 145 mmol/L   Potassium 5.8 (H) 3.5 - 5.1 mmol/L   Chloride 106 101 - 111 mmol/L   CO2 23 22 - 32 mmol/L   Glucose, Bld 130 (H) 65 - 99 mg/dL   BUN 16 6 - 20 mg/dL   Creatinine, Ser 2.91 (H) 0.44 - 1.00 mg/dL   Calcium 9.2 8.9 - 10.3 mg/dL   GFR calc non Af Amer 16 (L) >60 mL/min   GFR calc Af Amer 19 (L) >60 mL/min    Comment: (NOTE) The eGFR has been calculated using the CKD EPI equation. This calculation has not been  validated in all clinical situations. eGFR's persistently <60 mL/min signify possible Chronic Kidney Disease.    Anion gap 8 5 - 15  Glucose, capillary     Status: Abnormal   Collection Time: 08/17/15  7:08 AM  Result Value Ref Range   Glucose-Capillary 124 (H) 65 - 99 mg/dL  Glucose, capillary     Status: Abnormal   Collection Time: 08/17/15 11:48 AM  Result Value Ref Range   Glucose-Capillary 152 (H) 65 - 99 mg/dL  Glucose, capillary     Status: Abnormal   Collection Time: 08/17/15  4:43 PM  Result Value Ref Range   Glucose-Capillary 172 (H) 65 - 99 mg/dL  Glucose, capillary     Status: Abnormal   Collection Time: 08/17/15  8:40 PM  Result Value Ref Range   Glucose-Capillary 199 (H) 65 - 99 mg/dL  CBC     Status: Abnormal   Collection Time: 08/18/15  6:00 AM  Result Value Ref Range   WBC 9.3 4.0 - 10.5 K/uL   RBC 3.59 (L)  3.87 - 5.11 MIL/uL   Hemoglobin 9.1 (L) 12.0 - 15.0 g/dL   HCT 28.4 (L) 36.0 - 46.0 %   MCV 79.1 78.0 - 100.0 fL   MCH 25.3 (L) 26.0 - 34.0 pg   MCHC 32.0 30.0 - 36.0 g/dL   RDW 21.5 (H) 11.5 - 15.5 %   Platelets 204 150 - 400 K/uL  Basic metabolic panel     Status: Abnormal   Collection Time: 08/18/15  6:00 AM  Result Value Ref Range   Sodium 135 135 - 145 mmol/L   Potassium 5.8 (H) 3.5 - 5.1 mmol/L   Chloride 104 101 - 111 mmol/L   CO2 20 (L) 22 - 32 mmol/L   Glucose, Bld 123 (H) 65 - 99 mg/dL   BUN 16 6 - 20 mg/dL   Creatinine, Ser 2.78 (H) 0.44 - 1.00 mg/dL   Calcium 9.6 8.9 - 10.3 mg/dL   GFR calc non Af Amer 17 (L) >60 mL/min   GFR calc Af Amer 20 (L) >60 mL/min    Comment: (NOTE) The eGFR has been calculated using the CKD EPI equation. This calculation has not been validated in all clinical situations. eGFR's persistently <60 mL/min signify possible Chronic Kidney Disease.    Anion gap 11 5 - 15  Glucose, capillary     Status: Abnormal   Collection Time: 08/18/15  6:53 AM  Result Value Ref Range   Glucose-Capillary 117 (H) 65 - 99  mg/dL  Glucose, capillary     Status: Abnormal   Collection Time: 08/18/15 12:00 PM  Result Value Ref Range   Glucose-Capillary 172 (H) 65 - 99 mg/dL  Glucose, capillary     Status: Abnormal   Collection Time: 08/18/15  4:43 PM  Result Value Ref Range   Glucose-Capillary 192 (H) 65 - 99 mg/dL   Comment 1 Notify RN   Glucose, capillary     Status: Abnormal   Collection Time: 08/18/15  8:54 PM  Result Value Ref Range   Glucose-Capillary 213 (H) 65 - 99 mg/dL  Glucose, capillary     Status: Abnormal   Collection Time: 08/18/15 10:00 PM  Result Value Ref Range   Glucose-Capillary 178 (H) 65 - 99 mg/dL  Glucose, capillary     Status: Abnormal   Collection Time: 08/19/15  6:55 AM  Result Value Ref Range   Glucose-Capillary 125 (H) 65 - 99 mg/dL     Gen: NAD. Vital signs reviewed HEENT: Normocephalic, atraumatic Cardio: RRR. Resp: CTA B/L and unlabored GI: BS positive and NT, ND Neuro: Alert.  Oriented to person and place. Left facial droop.   Motor antigravity strength through out, but weaker on left Musc/Skel:  No edema. No tenderness. Skin:   Intact. Warm and dry.  Assessment/Plan:  1. Functional deficits secondary to acute encephalopathy which require 3+ hours per day of interdisciplinary therapy in a comprehensive inpatient rehab setting. Physiatrist is providing close team supervision and 24 hour management of active medical problems listed below. Physiatrist and rehab team continue to assess barriers to discharge/monitor patient progress toward functional and medical goals. FIM: Function - Bathing Bathing activity did not occur: Refused Position: Wheelchair/chair at sink Body parts bathed by patient: Right arm, Left arm, Chest, Abdomen, Front perineal area, Right upper leg, Left upper leg Body parts bathed by helper: Right lower leg, Left lower leg, Back, Buttocks Assist Level:  (mod)  Function- Upper Body Dressing/Undressing Upper body dressing/undressing activity did  not occur: Refused What is the patient  wearing?: Pull over shirt/dress Bra - Perfomed by patient: Thread/unthread right bra strap, Thread/unthread left bra strap, Hook/unhook bra (pull down sports bra) (assisted to straighten bra) Bra - Perfomed by helper: Hook/unhook bra (pull down sports bra) Pull over shirt/dress - Perfomed by patient: Thread/unthread right sleeve, Thread/unthread left sleeve, Put head through opening, Pull shirt over trunk Pull over shirt/dress - Perfomed by helper: Pull shirt over trunk Assist Level: Set up Set up : To obtain clothing/put away Function - Lower Body Dressing/Undressing Lower body dressing/undressing activity did not occur: Refused What is the patient wearing?: Underwear, Pants, Socks, Shoes Position: Wheelchair/chair at sink Underwear - Performed by patient: Thread/unthread right underwear leg Underwear - Performed by helper: Thread/unthread left underwear leg, Pull underwear up/down Pants- Performed by patient: Thread/unthread right pants leg Pants- Performed by helper: Thread/unthread left pants leg, Pull pants up/down Non-skid slipper socks- Performed by helper: Don/doff right sock, Don/doff left sock Socks - Performed by helper: Don/doff right sock, Don/doff left sock Shoes - Performed by helper: Don/doff right shoe, Don/doff left shoe, Fasten right, Fasten left Assist for footwear: Dependant Assist for lower body dressing:  (max a)  Function - Toileting Toileting activity did not occur: N/A (voiced no need) Toileting steps completed by patient: Performs perineal hygiene Toileting steps completed by helper: Adjust clothing after toileting, Adjust clothing prior to toileting Toileting Assistive Devices: Grab bar or rail Assist level:  (max A)  Function - Air cabin crew transfer activity did not occur: N/A (voiced no need) Toilet transfer assistive device: Elevated toilet seat/BSC over toilet, Grab bar Assist level to toilet:  Supervision or verbal cues Assist level from toilet: Supervision or verbal cues  Function - Chair/bed transfer Chair/bed transfer method: Stand pivot Chair/bed transfer assist level: Touching or steadying assistance (Pt > 75%) Chair/bed transfer assistive device: Armrests, Walker Chair/bed transfer details: Verbal cues for technique, Verbal cues for safe use of DME/AE  Function - Locomotion: Wheelchair Will patient use wheelchair at discharge?: Yes Type: Manual Max wheelchair distance: 150 Assist Level: Supervision or verbal cues Assist Level: Supervision or verbal cues Assist Level: Supervision or verbal cues Turns around,maneuvers to table,bed, and toilet,negotiates 3% grade,maneuvers on rugs and over doorsills: No Function - Locomotion: Ambulation Ambulation activity did not occur: Safety/medical concerns (diarrhea/weakness) Assistive device: Walker-rolling Max distance: 6 Assist level: Touching or steadying assistance (Pt > 75%) Assist level: Touching or steadying assistance (Pt > 75%) Walk 50 feet with 2 turns activity did not occur: Safety/medical concerns Assist level: Touching or steadying assistance (Pt > 75%) Walk 150 feet activity did not occur: Safety/medical concerns Walk 10 feet on uneven surfaces activity did not occur: Safety/medical concerns  Function - Comprehension Comprehension: Auditory Comprehension assist level: Understands basic 90% of the time/cues < 10% of the time  Function - Expression Expression: Verbal Expression assist level: Expresses basic 90% of the time/requires cueing < 10% of the time.  Function - Social Interaction Social Interaction assist level: Interacts appropriately 75 - 89% of the time - Needs redirection for appropriate language or to initiate interaction.  Function - Problem Solving Problem solving assist level: Solves basic 75 - 89% of the time/requires cueing 10 - 24% of the time  Function - Memory Memory assist level:  Recognizes or recalls 50 - 74% of the time/requires cueing 25 - 49% of the time Patient normally able to recall (first 3 days only): That he or she is in a hospital, Staff names and faces, Current season  Medical Problem List and  Plan: 1. Functional deficits secondary to acute encephalopathy due to urosepsis with prior history of CVA (right pons, basal ganglia infarcts)  -continue CIR therapies 2.  DVT Prophylaxis/Anticoagulation: Subcutaneous heparin indicated. Monitor platelet counts and any signs of bleeding 3. Pain Management: Tylenol as needed 4. Dysphagia. Dysphagia #3 thin liquids. Continued daily clinicaly reassessment                5. Neuropsych: This patient is capable of making decisions on her own behalf. 6. Skin/Wound Care: Routine skin checks 7. Fluids/Electrolytes/Nutrition: I personally reviewed the patient's labs today.   -hypokalemia, repleted.--now hyperkalemic---dc supplement 8. Acute on chronic renal insufficiency. Latest creatinine 2.78    -I personally reviewed the patient's labs today.              -encourage po fluids 9. Hypertension. bp's have been poorly controlled.Improving  .  -increased metoprolol to 23m xr---improved             -utilize prn clonidine for bp> 200/100 10. Hyperlipidemia. Lipitor 11. Diabetes mellitus and peripheral neuropathy. Hemoglobin A1c 6.3. Liberalized diet.  -lantus ---decreased to 15 units  -increase am lantus to 20 units--better control overall  -poor diet choices   12. Klebsiella/pseudomonas Urosepsis:  cipro d/ced on 11/18 13. Watery stools: Resolved 14. Insomnia:  No issues off trazodone 15. Anemia: hgb up to 9.1. likely chronic anemia       LOS (Days) 13 A FACE TO FACE EVALUATION WAS PERFORMED  SWARTZ,ZACHARY T 08/19/2015, 7:55 AM

## 2015-08-19 NOTE — Plan of Care (Signed)
Problem: RH BLADDER ELIMINATION Goal: RH STG MANAGE BLADDER WITH ASSISTANCE STG Manage Bladder With Assistance. Mod A  Outcome: Not Progressing incont at night

## 2015-08-20 ENCOUNTER — Inpatient Hospital Stay (HOSPITAL_COMMUNITY): Payer: Medicare Other | Admitting: Speech Pathology

## 2015-08-20 ENCOUNTER — Inpatient Hospital Stay (HOSPITAL_COMMUNITY): Payer: Medicare Other | Admitting: Physical Therapy

## 2015-08-20 ENCOUNTER — Inpatient Hospital Stay (HOSPITAL_COMMUNITY): Payer: Medicare Other

## 2015-08-20 LAB — GLUCOSE, CAPILLARY
GLUCOSE-CAPILLARY: 146 mg/dL — AB (ref 65–99)
Glucose-Capillary: 100 mg/dL — ABNORMAL HIGH (ref 65–99)
Glucose-Capillary: 160 mg/dL — ABNORMAL HIGH (ref 65–99)
Glucose-Capillary: 160 mg/dL — ABNORMAL HIGH (ref 65–99)

## 2015-08-20 NOTE — Progress Notes (Signed)
Occupational Therapy Session Note  Patient Details  Name: Angelica Beck MRN: [id number] Date of Birth: 1951/08/03  Today's Date: 08/20/2015 OT Individual Time: 0800-0900 OT Individual Time Calculation (min): 60 min    Short Term Goals: Week 1:  OT Short Term Goal 1 (Week 1): Pt will complete toilet transfer with min assist  OT Short Term Goal 1 - Progress (Week 1): Met OT Short Term Goal 2 (Week 1): Pt will complete therapeutic activity in standing for 2 minutes with min assist for balance OT Short Term Goal 2 - Progress (Week 1): Progressing toward goal OT Short Term Goal 3 (Week 1): Pt will complete LB dressing with max assist OT Short Term Goal 3 - Progress (Week 1): Met OT Short Term Goal 4 (Week 1): Pt will complete toileting task with max assist OT Short Term Goal 4 - Progress (Week 1): Met  Skilled Therapeutic Interventions/Progress Updates:    Pt engaged in BADL retraining including bathing and dressing with sit<>stand from w/c at sink.  Pt's daughter present for session and verbalized understanding of level of assistance.  Pt's daughter remained present throughout session.  Pt required min A for sit<>stand at beginning of session but performed all other sit<>stand with close supervision.  Pt continues to require assistance with pulling up underpants and pants, in addition to donning socks and fastening shoelaces.  Focus on increased activity tolerance, sit<>stand, standing balance, safety awareness, and family education.  Therapy Documentation Precautions:  Precautions Precautions: Fall Restrictions Weight Bearing Restrictions: No  Pain:  Pt denied pain See Function Navigator for Current Functional Status.   Therapy/Group: Individual Therapy  Leroy Libman 08/20/2015, 9:03 AM

## 2015-08-20 NOTE — Progress Notes (Signed)
Speech Language Pathology Weekly Progress and Session Notes  Patient Details  Name: Angelica Beck MRN: [id number] Date of Birth: September 20, 1951  Beginning of progress report period: August 13, 2015 End of progress report period: August 20, 2015  Today's Date: 08/20/2015 SLP Individual Time: 1005-1100 SLP Individual Time Calculation (min): 55 min  Short Term Goals: Week 2: SLP Short Term Goal 1 (Week 2): Pt will utilize external memory aids to facilitate recall of new and daily information with supervision verbal cues SLP Short Term Goal 1 - Progress (Week 2): Not met SLP Short Term Goal 2 (Week 2): Pt will utilize speech intelligibility strategies within structured tasks at the sentence level with supervision question cues SLP Short Term Goal 2 - Progress (Week 2): Met SLP Short Term Goal 3 (Week 2): Patient will consume current diet with minimal overt s/s of aspiration with Mod I for use of swallowing compensatory strategies.  SLP Short Term Goal 3 - Progress (Week 2): Met    New Short Term Goals: Week 3: SLP Short Term Goal 1 (Week 3): Pt will utilize speech intelligibility strategies within structured tasks at the sentence level with Mod I.  SLP Short Term Goal 2 (Week 3): Pt will utilize external memory aids to facilitate recall of new and daily information with supervision verbal cues.  Weekly Progress Updates: Patient has made functional gains and has met 2 of 3 STG's this reporting period due to increased swallowing function and speech intelligibility. Currently, patient is consuming Dys. 3 textures with thin liquids (baseline diet) without overt s/s of aspiration and is Mod I for use of swallowing compensatory strategies. Patient also requires Supervision verbal cues for use of her speech intelligibility strategies at the sentence level to achieve 90% intelligibility and Supervision-Min A verbal and question cues for recall of new, functional information. Patient and family  education is ongoing. Patient would benefit from continued skilled SLP intervention to maximize cognitive function and speech intelligibility to maximize her overall functional independence prior to discharge home.   Intensity: Minumum of 1-2 x/day, 30 to 90 minutes Frequency: 3 to 5 out of 7 days Duration/Length of Stay: 08/24/15 Treatment/Interventions: Cognitive remediation/compensation;Cueing hierarchy;Dysphagia/aspiration precaution training;Environmental controls;Functional tasks;Internal/external aids;Patient/family education;Speech/Language facilitation;Therapeutic Activities   Daily Session  Skilled Therapeutic Interventions: Skilled treatment session focused on patient/family education with the patient's daughter and speech goals. Patient's daughter educated in regards to the patient's current swallowing and cognitive function and speech intelligibility. Patient's daughter reports that the patient is at her baseline level of functioning and is prepared to provide the necessary physical and cognitive assistance needed at this time. SLP also provided supervision verbal cues for use of speech intelligibility strategies at the sentence level to achieve 90% intelligibility.  Patient left upright in wheelchair with all needs within reach. Continue with current plan of care.     Function:   Cognition Comprehension Comprehension assist level: Understands basic 90% of the time/cues < 10% of the time  Expression   Expression assist level: Expresses basic 90% of the time/requires cueing < 10% of the time.  Social Interaction Social Interaction assist level: Interacts appropriately 75 - 89% of the time - Needs redirection for appropriate language or to initiate interaction.  Problem Solving Problem solving assist level: Solves basic 75 - 89% of the time/requires cueing 10 - 24% of the time  Memory Memory assist level: Recognizes or recalls 75 - 89% of the time/requires cueing 10 - 24% of the time    Pain Pain Assessment Pain  Assessment: No/denies pain  Therapy/Group: Individual Therapy  Luretha Eberly, [place] 08/20/2015, 12:22 PM

## 2015-08-20 NOTE — Progress Notes (Signed)
Subjective/Complaints: Slept well. Doesn't like food. Upset with choices. Likes to eat a lot of junk food  ROS: Denies CP, SOB, N/V/D, anxiety, insomnia, visual issues  Objective: Vital Signs: Blood pressure 138/117, pulse 76, temperature 98.4 F (36.9 C), temperature source Oral, resp. rate 16, height '5\' 7"'  (1.702 m), weight 98.2 kg (216 lb 7.9 oz), SpO2 100 %. No results found. Results for orders placed or performed during the hospital encounter of 08/06/15 (from the past 72 hour(s))  Glucose, capillary     Status: Abnormal   Collection Time: 08/17/15 11:48 AM  Result Value Ref Range   Glucose-Capillary 152 (H) 65 - 99 mg/dL  Glucose, capillary     Status: Abnormal   Collection Time: 08/17/15  4:43 PM  Result Value Ref Range   Glucose-Capillary 172 (H) 65 - 99 mg/dL  Glucose, capillary     Status: Abnormal   Collection Time: 08/17/15  8:40 PM  Result Value Ref Range   Glucose-Capillary 199 (H) 65 - 99 mg/dL  CBC     Status: Abnormal   Collection Time: 08/18/15  6:00 AM  Result Value Ref Range   WBC 9.3 4.0 - 10.5 K/uL   RBC 3.59 (L) 3.87 - 5.11 MIL/uL   Hemoglobin 9.1 (L) 12.0 - 15.0 g/dL   HCT 28.4 (L) 36.0 - 46.0 %   MCV 79.1 78.0 - 100.0 fL   MCH 25.3 (L) 26.0 - 34.0 pg   MCHC 32.0 30.0 - 36.0 g/dL   RDW 21.5 (H) 11.5 - 15.5 %   Platelets 204 150 - 400 K/uL  Basic metabolic panel     Status: Abnormal   Collection Time: 08/18/15  6:00 AM  Result Value Ref Range   Sodium 135 135 - 145 mmol/L   Potassium 5.8 (H) 3.5 - 5.1 mmol/L   Chloride 104 101 - 111 mmol/L   CO2 20 (L) 22 - 32 mmol/L   Glucose, Bld 123 (H) 65 - 99 mg/dL   BUN 16 6 - 20 mg/dL   Creatinine, Ser 2.78 (H) 0.44 - 1.00 mg/dL   Calcium 9.6 8.9 - 10.3 mg/dL   GFR calc non Af Amer 17 (L) >60 mL/min   GFR calc Af Amer 20 (L) >60 mL/min    Comment: (NOTE) The eGFR has been calculated using the CKD EPI equation. This calculation has not been validated in all clinical situations. eGFR's persistently <60  mL/min signify possible Chronic Kidney Disease.    Anion gap 11 5 - 15  Glucose, capillary     Status: Abnormal   Collection Time: 08/18/15  6:53 AM  Result Value Ref Range   Glucose-Capillary 117 (H) 65 - 99 mg/dL  Glucose, capillary     Status: Abnormal   Collection Time: 08/18/15 12:00 PM  Result Value Ref Range   Glucose-Capillary 172 (H) 65 - 99 mg/dL  Glucose, capillary     Status: Abnormal   Collection Time: 08/18/15  4:43 PM  Result Value Ref Range   Glucose-Capillary 192 (H) 65 - 99 mg/dL   Comment 1 Notify RN   Glucose, capillary     Status: Abnormal   Collection Time: 08/18/15  8:54 PM  Result Value Ref Range   Glucose-Capillary 213 (H) 65 - 99 mg/dL  Glucose, capillary     Status: Abnormal   Collection Time: 08/18/15 10:00 PM  Result Value Ref Range   Glucose-Capillary 178 (H) 65 - 99 mg/dL  Glucose, capillary     Status: Abnormal  Collection Time: 08/19/15  6:55 AM  Result Value Ref Range   Glucose-Capillary 125 (H) 65 - 99 mg/dL  Glucose, capillary     Status: Abnormal   Collection Time: 08/19/15 11:23 AM  Result Value Ref Range   Glucose-Capillary 136 (H) 65 - 99 mg/dL  Glucose, capillary     Status: Abnormal   Collection Time: 08/19/15  4:23 PM  Result Value Ref Range   Glucose-Capillary 144 (H) 65 - 99 mg/dL  Glucose, capillary     Status: Abnormal   Collection Time: 08/19/15  9:35 PM  Result Value Ref Range   Glucose-Capillary 153 (H) 65 - 99 mg/dL  Glucose, capillary     Status: Abnormal   Collection Time: 08/20/15  6:35 AM  Result Value Ref Range   Glucose-Capillary 100 (H) 65 - 99 mg/dL     Gen: NAD. Vital signs reviewed HEENT: Normocephalic, atraumatic Cardio: RRR. Resp: CTA B/L and unlabored GI: BS positive and NT, ND Neuro: Alert.  Oriented to person and place. Left facial droop.   Motor antigravity strength through out, but weaker on left Musc/Skel:  No edema. No tenderness. Skin:   Intact. Warm and dry.  Assessment/Plan:  1.  Functional deficits secondary to acute encephalopathy which require 3+ hours per day of interdisciplinary therapy in a comprehensive inpatient rehab setting. Physiatrist is providing close team supervision and 24 hour management of active medical problems listed below. Physiatrist and rehab team continue to assess barriers to discharge/monitor patient progress toward functional and medical goals. FIM: Function - Bathing Bathing activity did not occur: Refused Position: Wheelchair/chair at sink Body parts bathed by patient: Right arm, Left arm, Chest, Abdomen, Front perineal area, Buttocks, Right upper leg, Left upper leg Body parts bathed by helper: Right lower leg, Left lower leg, Back, Buttocks Bathing not applicable: Right lower leg, Left lower leg, Back Assist Level: Touching or steadying assistance(Pt > 75%)  Function- Upper Body Dressing/Undressing Upper body dressing/undressing activity did not occur: Refused What is the patient wearing?: Bra, Pull over shirt/dress Bra - Perfomed by patient: Thread/unthread right bra strap, Thread/unthread left bra strap, Hook/unhook bra (pull down sports bra) Bra - Perfomed by helper: Hook/unhook bra (pull down sports bra) Pull over shirt/dress - Perfomed by patient: Thread/unthread right sleeve, Thread/unthread left sleeve, Put head through opening, Pull shirt over trunk Pull over shirt/dress - Perfomed by helper: Pull shirt over trunk Assist Level: Set up, Supervision or verbal cues Set up : To obtain clothing/put away Function - Lower Body Dressing/Undressing Lower body dressing/undressing activity did not occur: Refused What is the patient wearing?: Underwear, Pants, Shoes, Socks Position: Wheelchair/chair at sink Underwear - Performed by patient: Thread/unthread right underwear leg, Thread/unthread left underwear leg Underwear - Performed by helper: Pull underwear up/down Pants- Performed by patient: Thread/unthread right pants leg,  Thread/unthread left pants leg Pants- Performed by helper: Pull pants up/down Non-skid slipper socks- Performed by helper: Don/doff right sock, Don/doff left sock Socks - Performed by helper: Don/doff right sock, Don/doff left sock Shoes - Performed by patient: Don/doff right shoe, Don/doff left shoe Shoes - Performed by helper: Fasten right, Fasten left Assist for footwear: Partial/moderate assist Assist for lower body dressing:  (max a)  Function - Toileting Toileting activity did not occur: N/A (voiced no need) Toileting steps completed by patient: Performs perineal hygiene Toileting steps completed by helper: Adjust clothing after toileting, Adjust clothing prior to toileting Toileting Assistive Devices: Grab bar or rail Assist level:  (max A)  Function -  Air cabin crew transfer activity did not occur: N/A (voiced no need) Toilet transfer assistive device: Elevated toilet seat/BSC over toilet, Grab bar Assist level to toilet: Supervision or verbal cues Assist level from toilet: Supervision or verbal cues  Function - Chair/bed transfer Chair/bed transfer method: Stand pivot Chair/bed transfer assist level: Touching or steadying assistance (Pt > 75%) Chair/bed transfer assistive device: Armrests, Walker Chair/bed transfer details: Verbal cues for technique, Verbal cues for safe use of DME/AE  Function - Locomotion: Wheelchair Will patient use wheelchair at discharge?: Yes Type: Manual Max wheelchair distance: 150 Assist Level: Supervision or verbal cues Assist Level: Supervision or verbal cues Assist Level: Supervision or verbal cues Turns around,maneuvers to table,bed, and toilet,negotiates 3% grade,maneuvers on rugs and over doorsills: No Function - Locomotion: Ambulation Ambulation activity did not occur: Safety/medical concerns (diarrhea/weakness) Assistive device: Walker-rolling Max distance: 6 Assist level: Touching or steadying assistance (Pt > 75%) Assist  level: Touching or steadying assistance (Pt > 75%) Walk 50 feet with 2 turns activity did not occur: Safety/medical concerns Assist level: Touching or steadying assistance (Pt > 75%) Walk 150 feet activity did not occur: Safety/medical concerns Walk 10 feet on uneven surfaces activity did not occur: Safety/medical concerns  Function - Comprehension Comprehension: Auditory Comprehension assist level: Understands basic 90% of the time/cues < 10% of the time  Function - Expression Expression: Verbal Expression assist level: Expresses basic 90% of the time/requires cueing < 10% of the time.  Function - Social Interaction Social Interaction assist level: Interacts appropriately 75 - 89% of the time - Needs redirection for appropriate language or to initiate interaction.  Function - Problem Solving Problem solving assist level: Solves basic 75 - 89% of the time/requires cueing 10 - 24% of the time  Function - Memory Memory assist level: Recognizes or recalls 50 - 74% of the time/requires cueing 25 - 49% of the time Patient normally able to recall (first 3 days only): That he or she is in a hospital, Staff names and faces, Current season  Medical Problem List and Plan: 1. Functional deficits secondary to acute encephalopathy due to urosepsis with prior history of CVA (right pons, basal ganglia infarcts)  -continue CIR therapies 2.  DVT Prophylaxis/Anticoagulation: Subcutaneous heparin indicated. Monitor platelet counts and any signs of bleeding 3. Pain Management: Tylenol as needed 4. Dysphagia. Dysphagia #3 thin liquids. Continued daily clinicaly reassessment                5. Neuropsych: This patient is capable of making decisions on her own behalf. 6. Skin/Wound Care: Routine skin checks 7. Fluids/Electrolytes/Nutrition: I personally reviewed the patient's labs today.   -hypokalemia, repleted.--now hyperkalemic---dc supplement  -recheck labs tomorrow 8. Acute on chronic renal  insufficiency. Latest creatinine 2.78    -I personally reviewed the patient's labs today.              -encourage po fluids 9. Hypertension. bp's have been poorly controlled.Improving  .  -increased metoprolol to 1m xr---improved             -utilize prn clonidine for bp> 200/100 10. Hyperlipidemia. Lipitor 11. Diabetes mellitus and peripheral neuropathy. Hemoglobin A1c 6.3. Liberalized diet.  -lantus ---decreased to 15 units  -increased am lantus to 20 units--with good results  -poor diet choices still   12. Klebsiella/pseudomonas Urosepsis:  cipro d/ced on 11/18 13. Watery stools: Resolved 14. Insomnia:  No issues off trazodone 15. Anemia: hgb up to 9.1. likely chronic anemia  LOS (Days) 14 A FACE TO FACE EVALUATION WAS PERFORMED  Keyarah Mcroy T 08/20/2015, 9:23 AM

## 2015-08-20 NOTE — Progress Notes (Signed)
Occupational Therapy Note  Patient Details  Name: Angelica Beck MRN: [id number] Date of Birth: Jan 06, 1951  Today's Date: 08/20/2015 OT Individual Time: 1100-1130 OT Individual Time Calculation (min): 30 min   Pt denied pain Individual Therapy  Pt engaged in dynamic standing tasks without UE support (clothies pin task and folding linens). Pt required rest breaks X 4 during tasks.  Pt propelled w/c room<>gym using BLE to propel.  Focus on activity tolerance, dynamic standing balance, and safety awareness to increase independence with BADLs and decrease burden of care.   Leotis Shames [facility] 08/20/2015, 12:20 PM

## 2015-08-20 NOTE — Progress Notes (Signed)
Physical Therapy Session Note  Patient Details  Name: Angelica Beck MRN: [id number] Date of Birth: 1951-05-26  Today's Date: 08/20/2015 PT Individual Time: 0900-0953 PT Individual Time Calculation (min): 53 min   Short Term Goals: Week 2:  PT Short Term Goal 1 (Week 2): =LTGs due to ELOS  Skilled Therapeutic Interventions/Progress Updates:   Patient's daughter present for family education. Patient performed toilet transfer using grab bar with mod A to toilet and supervision back to wheelchair but was unable to void. Patient propelled wheelchair using BLE x 200 ft and short distances throughout rehab unit with supervision, gait using RW short distances 15 ft or less with supervision and patient limited by R knee pain, stair training up/down 8 (3") stairs using 2 rails with supervision and 2 stairs using R rail to simulate garage entry (pushing down on deep freezer, no rail) with min A due to decreased LLE clearance, simulated car transfer to sedan height using RW with min A to lift LLE into car and min A to stand from low car seat, and performed bed mobility on regular bed in ADL apartment with mod A. Patient and daughter reporting that patient is close to baseline for functional mobility and with no further questions/concerns regarding discharge at this time. Patient left sitting in wheelchair with daughter present and call bell within reach.    Therapy Documentation Precautions:  Precautions Precautions: Fall Restrictions Weight Bearing Restrictions: No Pain: Pain Assessment Pain Assessment: No/denies pain  See Function Navigator for Current Functional Status.  Therapy/Group: Individual Therapy  Laretta Alstrom 08/20/2015, 9:58 AM

## 2015-08-21 ENCOUNTER — Inpatient Hospital Stay (HOSPITAL_COMMUNITY): Payer: Medicare Other | Admitting: Occupational Therapy

## 2015-08-21 DIAGNOSIS — E1165 Type 2 diabetes mellitus with hyperglycemia: Secondary | ICD-10-CM

## 2015-08-21 LAB — GLUCOSE, CAPILLARY
Glucose-Capillary: 107 mg/dL — ABNORMAL HIGH (ref 65–99)
Glucose-Capillary: 202 mg/dL — ABNORMAL HIGH (ref 65–99)
Glucose-Capillary: 173 mg/dL — ABNORMAL HIGH (ref 65–99)
Glucose-Capillary: 204 mg/dL — ABNORMAL HIGH (ref 65–99)

## 2015-08-21 LAB — BASIC METABOLIC PANEL
Anion gap: 8 (ref 5–15)
BUN: 15 mg/dL (ref 6–20)
CALCIUM: 9.4 mg/dL (ref 8.9–10.3)
CO2: 26 mmol/L (ref 22–32)
CREATININE: 2.91 mg/dL — AB (ref 0.44–1.00)
Chloride: 104 mmol/L (ref 101–111)
GFR calc Af Amer: 19 mL/min — ABNORMAL LOW (ref >60)
GFR, EST NON AFRICAN AMERICAN: 16 mL/min — AB (ref >60)
GLUCOSE: 118 mg/dL — AB (ref 65–99)
Potassium: 4.2 mmol/L (ref 3.5–5.1)
SODIUM: 138 mmol/L (ref 135–145)

## 2015-08-21 LAB — OCCULT BLOOD X 1 CARD TO LAB, STOOL: FECAL OCCULT BLD: NEGATIVE

## 2015-08-21 NOTE — Progress Notes (Signed)
Subjective/Complaints: No new complaints. Eating food even though she doesn't like it!  ROS: Denies CP, SOB, N/V/D, anxiety, insomnia, visual issues  Objective: Vital Signs: Blood pressure 142/70, pulse 109, temperature 98.4 F (36.9 C), temperature source Oral, resp. rate 18, height $RemoveBe'5\' 7"'[id number]$  (1.702 m), weight 97.9 kg (215 lb 13.3 oz), SpO2 100 %. No results found. Results for orders placed or performed during the hospital encounter of 08/06/15 (from the past 72 hour(s))  Glucose, capillary     Status: Abnormal   Collection Time: 08/18/15 12:00 PM  Result Value Ref Range   Glucose-Capillary 172 (H) 65 - 99 mg/dL  Glucose, capillary     Status: Abnormal   Collection Time: 08/18/15  4:43 PM  Result Value Ref Range   Glucose-Capillary 192 (H) 65 - 99 mg/dL   Comment 1 Notify RN   Glucose, capillary     Status: Abnormal   Collection Time: 08/18/15  8:54 PM  Result Value Ref Range   Glucose-Capillary 213 (H) 65 - 99 mg/dL  Glucose, capillary     Status: Abnormal   Collection Time: 08/18/15 10:00 PM  Result Value Ref Range   Glucose-Capillary 178 (H) 65 - 99 mg/dL  Glucose, capillary     Status: Abnormal   Collection Time: 08/19/15  6:55 AM  Result Value Ref Range   Glucose-Capillary 125 (H) 65 - 99 mg/dL  Glucose, capillary     Status: Abnormal   Collection Time: 08/19/15 11:23 AM  Result Value Ref Range   Glucose-Capillary 136 (H) 65 - 99 mg/dL  Glucose, capillary     Status: Abnormal   Collection Time: 08/19/15  4:23 PM  Result Value Ref Range   Glucose-Capillary 144 (H) 65 - 99 mg/dL  Glucose, capillary     Status: Abnormal   Collection Time: 08/19/15  9:35 PM  Result Value Ref Range   Glucose-Capillary 153 (H) 65 - 99 mg/dL  Glucose, capillary     Status: Abnormal   Collection Time: 08/20/15  6:35 AM  Result Value Ref Range   Glucose-Capillary 100 (H) 65 - 99 mg/dL  Glucose, capillary     Status: Abnormal   Collection Time: 08/20/15 11:37 AM  Result Value Ref Range   Glucose-Capillary 160 (H) 65 - 99 mg/dL  Glucose, capillary     Status: Abnormal   Collection Time: 08/20/15  4:25 PM  Result Value Ref Range   Glucose-Capillary 160 (H) 65 - 99 mg/dL  Glucose, capillary     Status: Abnormal   Collection Time: 08/20/15  8:44 PM  Result Value Ref Range   Glucose-Capillary 146 (H) 65 - 99 mg/dL   Comment 1 Notify RN   Basic metabolic panel     Status: Abnormal   Collection Time: 08/21/15  5:42 AM  Result Value Ref Range   Sodium 138 135 - 145 mmol/L   Potassium 4.2 3.5 - 5.1 mmol/L   Chloride 104 101 - 111 mmol/L   CO2 26 22 - 32 mmol/L   Glucose, Bld 118 (H) 65 - 99 mg/dL   BUN 15 6 - 20 mg/dL   Creatinine, Ser 2.91 (H) 0.44 - 1.00 mg/dL   Calcium 9.4 8.9 - 10.3 mg/dL   GFR calc non Af Amer 16 (L) >60 mL/min   GFR calc Af Amer 19 (L) >60 mL/min    Comment: (NOTE) The eGFR has been calculated using the CKD EPI equation. This calculation has not been validated in all clinical situations. eGFR's persistently <60 mL/min  signify possible Chronic Kidney Disease.    Anion gap 8 5 - 15  Glucose, capillary     Status: Abnormal   Collection Time: 08/21/15  6:45 AM  Result Value Ref Range   Glucose-Capillary 107 (H) 65 - 99 mg/dL     Gen: NAD. Vital signs reviewed HEENT: Normocephalic, atraumatic Cardio: RRR. Resp: CTA B/L and unlabored GI: BS positive and NT, ND Neuro: Alert.  Oriented to person and place. Left facial droop.   Motor antigravity strength through out, but weaker on left Musc/Skel:  No edema. No tenderness. Skin:   Intact. Warm and dry.  Assessment/Plan:  1. Functional deficits secondary to acute encephalopathy which require 3+ hours per day of interdisciplinary therapy in a comprehensive inpatient rehab setting. Physiatrist is providing close team supervision and 24 hour management of active medical problems listed below. Physiatrist and rehab team continue to assess barriers to discharge/monitor patient progress toward  functional and medical goals. FIM: Function - Bathing Bathing activity did not occur: Refused Position: Wheelchair/chair at sink Body parts bathed by patient: Right arm, Left arm, Chest, Abdomen, Front perineal area, Buttocks, Right upper leg, Left upper leg Body parts bathed by helper: Right lower leg, Left lower leg, Back, Buttocks Bathing not applicable: Right lower leg, Left lower leg, Back Assist Level: Touching or steadying assistance(Pt > 75%)  Function- Upper Body Dressing/Undressing Upper body dressing/undressing activity did not occur: Refused What is the patient wearing?: Bra, Pull over shirt/dress Bra - Perfomed by patient: Thread/unthread right bra strap, Thread/unthread left bra strap, Hook/unhook bra (pull down sports bra) Bra - Perfomed by helper: Hook/unhook bra (pull down sports bra) Pull over shirt/dress - Perfomed by patient: Thread/unthread right sleeve, Thread/unthread left sleeve, Put head through opening, Pull shirt over trunk Pull over shirt/dress - Perfomed by helper: Pull shirt over trunk Assist Level: Set up, Supervision or verbal cues Set up : To obtain clothing/put away Function - Lower Body Dressing/Undressing Lower body dressing/undressing activity did not occur: Refused What is the patient wearing?: Underwear, Pants, Shoes, Socks Position: Wheelchair/chair at sink Underwear - Performed by patient: Thread/unthread right underwear leg, Thread/unthread left underwear leg Underwear - Performed by helper: Pull underwear up/down Pants- Performed by patient: Thread/unthread right pants leg, Thread/unthread left pants leg Pants- Performed by helper: Pull pants up/down Non-skid slipper socks- Performed by helper: Don/doff right sock, Don/doff left sock Socks - Performed by helper: Don/doff right sock, Don/doff left sock Shoes - Performed by patient: Don/doff right shoe, Don/doff left shoe Shoes - Performed by helper: Fasten right, Fasten left Assist for  footwear: Partial/moderate assist Assist for lower body dressing:  (max a)  Function - Toileting Toileting activity did not occur: N/A (voiced no need) Toileting steps completed by patient: Performs perineal hygiene Toileting steps completed by helper: Adjust clothing after toileting, Adjust clothing prior to toileting Toileting Assistive Devices: Grab bar or rail Assist level:  (max A)  Function - Air cabin crew transfer activity did not occur: N/A (voiced no need) Toilet transfer assistive device: Elevated toilet seat/BSC over toilet, Grab bar Assist level to toilet: Supervision or verbal cues Assist level from toilet: Supervision or verbal cues  Function - Chair/bed transfer Chair/bed transfer method: Ambulatory Chair/bed transfer assist level: Supervision or verbal cues Chair/bed transfer assistive device: Armrests, Walker Chair/bed transfer details: Verbal cues for technique  Function - Locomotion: Wheelchair Will patient use wheelchair at discharge?: Yes Type: Manual Max wheelchair distance: 2q00 Assist Level: Supervision or verbal cues Assist Level: Supervision or verbal  cues Assist Level: Supervision or verbal cues Turns around,maneuvers to table,bed, and toilet,negotiates 3% grade,maneuvers on rugs and over doorsills: No Function - Locomotion: Ambulation Ambulation activity did not occur: Safety/medical concerns (diarrhea/weakness) Assistive device: Walker-rolling Max distance: 15 Assist level: Supervision or verbal cues Assist level: Supervision or verbal cues Walk 50 feet with 2 turns activity did not occur: Safety/medical concerns Assist level: Touching or steadying assistance (Pt > 75%) Walk 150 feet activity did not occur: Safety/medical concerns Walk 10 feet on uneven surfaces activity did not occur: Safety/medical concerns  Function - Comprehension Comprehension: Auditory Comprehension assist level: Understands basic 90% of the time/cues < 10% of  the time  Function - Expression Expression: Verbal Expression assist level: Expresses basic 90% of the time/requires cueing < 10% of the time.  Function - Social Interaction Social Interaction assist level: Interacts appropriately 75 - 89% of the time - Needs redirection for appropriate language or to initiate interaction.  Function - Problem Solving Problem solving assist level: Solves basic 75 - 89% of the time/requires cueing 10 - 24% of the time  Function - Memory Memory assist level: Recognizes or recalls 75 - 89% of the time/requires cueing 10 - 24% of the time Patient normally able to recall (first 3 days only): That he or she is in a hospital, Staff names and faces, Current season  Medical Problem List and Plan: 1. Functional deficits secondary to acute encephalopathy due to urosepsis with prior history of CVA (right pons, basal ganglia infarcts)  -continue CIR therapies 2.  DVT Prophylaxis/Anticoagulation: Subcutaneous heparin indicated. Monitor platelet counts and any signs of bleeding 3. Pain Management: Tylenol as needed 4. Dysphagia. Dysphagia #3 thin liquids. Continued daily clinicaly reassessment                5. Neuropsych: This patient is capable of making decisions on her own behalf. 6. Skin/Wound Care: Routine skin checks 7. Fluids/Electrolytes/Nutrition: I personally reviewed the patient's labs today.   -potassium now normal after stopping supplement.     8. Acute on chronic renal insufficiency. Latest creatinine 2.91    -outpt follow up             -encourage po fluids 9. Hypertension. bp's have been poorly controlled.Improving  .  -increased metoprolol to 24m xr---improved             -utilize prn clonidine for bp> 200/100 10. Hyperlipidemia. Lipitor 11. Diabetes mellitus and peripheral neuropathy. Hemoglobin A1c 6.3. Liberalized diet.  -on lantus 20 units only in AM as the pharmacist cancelled my PM order on 11/24.  Will continue with AM dosing only for  now and observe. I was using BOTH for better 24 hour control of blood sugars.  -reviewed diet choices with patient today   12. Klebsiella/pseudomonas Urosepsis:  cipro d/ced on 11/18 13. Watery stools: Resolved 14. Insomnia:  No issues off trazodone 15. Anemia: hgb up to 9.1. likely chronic anemia       LOS (Days) 15 A FACE TO FACE EVALUATION WAS PERFORMED  Davinder Haff T 08/21/2015, 10:27 AM

## 2015-08-21 NOTE — Progress Notes (Signed)
Occupational Therapy Session Note  Patient Details  Name: Angelica Beck MRN: [id number] Date of Birth: Dec 08, 1950  Today's Date: 08/21/2015 OT Individual Time: 1415-1455 OT Individual Time Calculation (min): 40 min    Short Term Goals: Week 2:  OT Short Term Goal 1 (Week 2): Pt will perform bathing tasks with mod A with sit<>stand at sink OT Short Term Goal 2 (Week 2): Pt will perform LB dressing tasks with mod A with sit<>stand from w/c at sink OT Short Term Goal 3 (Week 2): Pt will perform toileting tasks with mod A OT Short Term Goal 4 (Week 2): Pt will maintain standing balance with supervision for 1 minute while completing bathing/dressing tasks  Skilled Therapeutic Interventions/Progress Updates:    1:1 Engaged in functional activities promoting functional executive cognitive skills (including: problem solving, selective attention, multitasking), sit to stands and standing balance. Pt transitioned down to the ADL apartment. Engaged in checking off of a list all the items we did have in the kitchen- requiring visual scanning and attention to detail in standing. PT able to perform sit to stands and standing balance with supervision. Pt tolerated standing for 8-10 min before requiring a seated break. Pt required assistance to spell items to add to her list. Pt was then able to generate a list of items needed to be purchased.   Therapy Documentation Precautions:  Precautions Precautions: Fall Restrictions Weight Bearing Restrictions: No Pain:  no c/o pain in session    See Function Navigator for Current Functional Status.   Therapy/Group: Individual Therapy  Willeen Cass [facility] 08/21/2015, 2:56 PM

## 2015-08-22 ENCOUNTER — Inpatient Hospital Stay (HOSPITAL_COMMUNITY): Payer: Medicare Other | Admitting: Occupational Therapy

## 2015-08-22 ENCOUNTER — Inpatient Hospital Stay (HOSPITAL_COMMUNITY): Payer: Medicare Other | Admitting: Physical Therapy

## 2015-08-22 LAB — GLUCOSE, CAPILLARY
GLUCOSE-CAPILLARY: 186 mg/dL — AB (ref 65–99)
Glucose-Capillary: 119 mg/dL — ABNORMAL HIGH (ref 65–99)
Glucose-Capillary: 218 mg/dL — ABNORMAL HIGH (ref 65–99)
Glucose-Capillary: 110 mg/dL — ABNORMAL HIGH (ref 65–99)

## 2015-08-22 MED ORDER — INSULIN GLARGINE 100 UNIT/ML ~~LOC~~ SOLN
10.0000 [IU] | Freq: Every day | SUBCUTANEOUS | Status: DC
Start: 2015-08-22 — End: 2015-08-24
  Administered 2015-08-22 – 2015-08-23 (×2): 10 [IU] via SUBCUTANEOUS
  Filled 2015-08-22 (×4): qty 0.1

## 2015-08-22 NOTE — Progress Notes (Addendum)
Physical Therapy Session Note  Patient Details  Name: Angelica Beck MRN: [id number] Date of Birth: 27-Oct-1950  Today's Date: 08/22/2015 PT Individual Time: 0800-0900, 1300-1415 PT Individual Time Calculation (min): 60 min , 75  Short Term Goals: Week 1:  PT Short Term Goal 1 (Week 1): pt will move supine> sit using controls of hospital bed with supervision PT Short Term Goal 1 - Progress (Week 1): Not met PT Short Term Goal 2 (Week 1): pt will transfer bed>< w/c with min assist PT Short Term Goal 2 - Progress (Week 1): Not met PT Short Term Goal 4 (Week 1): pt will propel w/c x 150' with supervision PT Short Term Goal 4 - Progress (Week 1): Not met PT Short Term Goal 5 (Week 1): pt will perform gait x 30' with assistance of 1 person PT Short Term Goal 5 - Progress (Week 1): Met Week 2:  PT Short Term Goal 1 (Week 2): =LTGs due to ELOS  Skilled Therapeutic Interventions/Progress Updates:    Pt fatigues quickly with upright activities. Pt's poor postural stability contributes to strength and endurance deficits. Pt Pt would continue to benefit from skilled PT services to increase functional mobility.  Therapy Documentation Precautions:  Precautions Precautions: Fall Restrictions Weight Bearing Restrictions: No Vital Signs: Therapy Vitals Temp: 99.1 F (37.3 C) Temp Source: Oral Pulse Rate: (!) 103 Resp: 18 BP: 139/72 mmHg Patient Position (if appropriate): Sitting Oxygen Therapy SpO2: 98 % O2 Device: Not Delivered Pain: Pain Assessment Pain Assessment: No/denies pain Mobility:  Mod A for bed mobility with cues for weight shift and technique, CGA for transfers with cues for technique Locomotion :   SBA with cues for pacing and posture Other Treatments:  Tx 1: Pt performs sitting and standing balance tasks with dual motor tasks within functional context. Pt educated on rehab plan, safety in mobility, rehab progress. Pt performs seated knee flexion and ankle DF sliding on  floor, hip ER/IR AAROM with contract relax, anterior weight shift, ankle inversion with plantar flex 2x10. Pt positioned in neutral position at end of session.  Tx 2: Pt performs sidelying PNF with emphasis on pelvic posterior depression. Pt educated on rehab plan, safety in mobility, and core stability. Pt performs pelvic rotation walk out x5 B/L. Pt performs marching, anterior weight shifts in sitting, heel raises, hip ext AROM in standing, LAQs 2x10. L/S and T/S ext soft tissue stretches 2x30". Transfers x10 in session.  See Function Navigator for Current Functional Status.   Therapy/Group: Individual Therapy  Monia Pouch 08/22/2015, 8:18 AM

## 2015-08-22 NOTE — Progress Notes (Signed)
Subjective/Complaints: No new complaints. Denies pain. Slept pretty well  ROS: Denies CP, SOB, N/V/D, anxiety, insomnia, visual issues  Objective: Vital Signs: Blood pressure 139/72, pulse 103, temperature 99.1 F (37.3 C), temperature source Oral, resp. rate 18, height '5\' 7"'  (1.702 m), weight 98.4 kg (216 lb 14.9 oz), SpO2 98 %. No results found. Results for orders placed or performed during the hospital encounter of 08/06/15 (from the past 72 hour(s))  Glucose, capillary     Status: Abnormal   Collection Time: 08/19/15 11:23 AM  Result Value Ref Range   Glucose-Capillary 136 (H) 65 - 99 mg/dL  Glucose, capillary     Status: Abnormal   Collection Time: 08/19/15  4:23 PM  Result Value Ref Range   Glucose-Capillary 144 (H) 65 - 99 mg/dL  Glucose, capillary     Status: Abnormal   Collection Time: 08/19/15  9:35 PM  Result Value Ref Range   Glucose-Capillary 153 (H) 65 - 99 mg/dL  Glucose, capillary     Status: Abnormal   Collection Time: 08/20/15  6:35 AM  Result Value Ref Range   Glucose-Capillary 100 (H) 65 - 99 mg/dL  Glucose, capillary     Status: Abnormal   Collection Time: 08/20/15 11:37 AM  Result Value Ref Range   Glucose-Capillary 160 (H) 65 - 99 mg/dL  Glucose, capillary     Status: Abnormal   Collection Time: 08/20/15  4:25 PM  Result Value Ref Range   Glucose-Capillary 160 (H) 65 - 99 mg/dL  Glucose, capillary     Status: Abnormal   Collection Time: 08/20/15  8:44 PM  Result Value Ref Range   Glucose-Capillary 146 (H) 65 - 99 mg/dL   Comment 1 Notify RN   Basic metabolic panel     Status: Abnormal   Collection Time: 08/21/15  5:42 AM  Result Value Ref Range   Sodium 138 135 - 145 mmol/L   Potassium 4.2 3.5 - 5.1 mmol/L   Chloride 104 101 - 111 mmol/L   CO2 26 22 - 32 mmol/L   Glucose, Bld 118 (H) 65 - 99 mg/dL   BUN 15 6 - 20 mg/dL   Creatinine, Ser 2.91 (H) 0.44 - 1.00 mg/dL   Calcium 9.4 8.9 - 10.3 mg/dL   GFR calc non Af Amer 16 (L) >60 mL/min   GFR  calc Af Amer 19 (L) >60 mL/min    Comment: (NOTE) The eGFR has been calculated using the CKD EPI equation. This calculation has not been validated in all clinical situations. eGFR's persistently <60 mL/min signify possible Chronic Kidney Disease.    Anion gap 8 5 - 15  Glucose, capillary     Status: Abnormal   Collection Time: 08/21/15  6:45 AM  Result Value Ref Range   Glucose-Capillary 107 (H) 65 - 99 mg/dL  Occult blood card to lab, stool RN will collect     Status: None   Collection Time: 08/21/15 10:31 AM  Result Value Ref Range   Fecal Occult Bld NEGATIVE NEGATIVE  Glucose, capillary     Status: Abnormal   Collection Time: 08/21/15 11:31 AM  Result Value Ref Range   Glucose-Capillary 202 (H) 65 - 99 mg/dL  Glucose, capillary     Status: Abnormal   Collection Time: 08/21/15  4:29 PM  Result Value Ref Range   Glucose-Capillary 173 (H) 65 - 99 mg/dL  Glucose, capillary     Status: Abnormal   Collection Time: 08/21/15  9:07 PM  Result Value Ref  Range   Glucose-Capillary 204 (H) 65 - 99 mg/dL   Comment 1 Notify RN   Glucose, capillary     Status: Abnormal   Collection Time: 08/22/15  6:27 AM  Result Value Ref Range   Glucose-Capillary 119 (H) 65 - 99 mg/dL   Comment 1 Notify RN      Gen: NAD. Vital signs reviewed HEENT: Normocephalic, atraumatic Cardio: RRR. Resp: CTA B/L and unlabored GI: BS positive and NT, ND Neuro: Alert.  Oriented to person and place. Left facial droop.   Motor antigravity strength through out, but weaker on left Musc/Skel:  No edema. No tenderness. Skin:   Intact. Warm and dry.  Assessment/Plan:  1. Functional deficits secondary to acute encephalopathy which require 3+ hours per day of interdisciplinary therapy in a comprehensive inpatient rehab setting. Physiatrist is providing close team supervision and 24 hour management of active medical problems listed below. Physiatrist and rehab team continue to assess barriers to discharge/monitor  patient progress toward functional and medical goals. FIM: Function - Bathing Bathing activity did not occur: Refused Position: Wheelchair/chair at sink Body parts bathed by patient: Right arm, Left arm, Chest, Abdomen, Front perineal area, Buttocks, Right upper leg, Left upper leg Body parts bathed by helper: Right lower leg, Left lower leg, Back, Buttocks Bathing not applicable: Right lower leg, Left lower leg, Back Assist Level: Touching or steadying assistance(Pt > 75%)  Function- Upper Body Dressing/Undressing Upper body dressing/undressing activity did not occur: Refused What is the patient wearing?: Bra, Pull over shirt/dress Bra - Perfomed by patient: Thread/unthread right bra strap, Thread/unthread left bra strap, Hook/unhook bra (pull down sports bra) Bra - Perfomed by helper: Hook/unhook bra (pull down sports bra) Pull over shirt/dress - Perfomed by patient: Thread/unthread right sleeve, Thread/unthread left sleeve, Put head through opening, Pull shirt over trunk Pull over shirt/dress - Perfomed by helper: Pull shirt over trunk Assist Level: Set up, Supervision or verbal cues Set up : To obtain clothing/put away Function - Lower Body Dressing/Undressing Lower body dressing/undressing activity did not occur: Refused What is the patient wearing?: Underwear, Pants, Shoes, Socks Position: Wheelchair/chair at sink Underwear - Performed by patient: Thread/unthread right underwear leg, Thread/unthread left underwear leg Underwear - Performed by helper: Pull underwear up/down Pants- Performed by patient: Thread/unthread right pants leg, Thread/unthread left pants leg Pants- Performed by helper: Pull pants up/down Non-skid slipper socks- Performed by helper: Don/doff right sock, Don/doff left sock Socks - Performed by helper: Don/doff right sock, Don/doff left sock Shoes - Performed by patient: Don/doff right shoe, Don/doff left shoe Shoes - Performed by helper: Fasten right, Fasten  left Assist for footwear: Partial/moderate assist Assist for lower body dressing:  (max a)  Function - Toileting Toileting activity did not occur: N/A (voiced no need) Toileting steps completed by patient: Performs perineal hygiene Toileting steps completed by helper: Adjust clothing prior to toileting, Performs perineal hygiene, Adjust clothing after toileting Toileting Assistive Devices: Grab bar or rail Assist level:  (max A)  Function - Air cabin crew transfer activity did not occur: N/A (voiced no need) Toilet transfer assistive device: Elevated toilet seat/BSC over toilet, Grab bar Assist level to toilet: Supervision or verbal cues Assist level from toilet: Supervision or verbal cues  Function - Chair/bed transfer Chair/bed transfer method: Ambulatory Chair/bed transfer assist level: Supervision or verbal cues Chair/bed transfer assistive device: Armrests, Walker Chair/bed transfer details: Verbal cues for technique  Function - Locomotion: Wheelchair Will patient use wheelchair at discharge?: Yes Type: Manual Max wheelchair  distance: 2q00 Assist Level: Supervision or verbal cues Assist Level: Supervision or verbal cues Assist Level: Supervision or verbal cues Turns around,maneuvers to table,bed, and toilet,negotiates 3% grade,maneuvers on rugs and over doorsills: No Function - Locomotion: Ambulation Ambulation activity did not occur: Safety/medical concerns (diarrhea/weakness) Assistive device: Walker-rolling Max distance: 15 Assist level: Supervision or verbal cues Assist level: Supervision or verbal cues Walk 50 feet with 2 turns activity did not occur: Safety/medical concerns Assist level: Touching or steadying assistance (Pt > 75%) Walk 150 feet activity did not occur: Safety/medical concerns Walk 10 feet on uneven surfaces activity did not occur: Safety/medical concerns  Function - Comprehension Comprehension: Auditory Comprehension assist level:  Understands basic 90% of the time/cues < 10% of the time  Function - Expression Expression: Verbal Expression assist level: Expresses basic 90% of the time/requires cueing < 10% of the time.  Function - Social Interaction Social Interaction assist level: Interacts appropriately 75 - 89% of the time - Needs redirection for appropriate language or to initiate interaction.  Function - Problem Solving Problem solving assist level: Solves basic 75 - 89% of the time/requires cueing 10 - 24% of the time  Function - Memory Memory assist level: Recognizes or recalls 75 - 89% of the time/requires cueing 10 - 24% of the time Patient normally able to recall (first 3 days only): That he or she is in a hospital, Staff names and faces, Current season  Medical Problem List and Plan: 1. Functional deficits secondary to acute encephalopathy due to urosepsis with prior history of CVA (right pons, basal ganglia infarcts)  -continue CIR therapies 2.  DVT Prophylaxis/Anticoagulation: Subcutaneous heparin indicated. Monitor platelet counts and any signs of bleeding 3. Pain Management: Tylenol as needed 4. Dysphagia. Dysphagia #3 thin liquids. Continued daily clinicaly reassessment                5. Neuropsych: This patient is capable of making decisions on her own behalf. 6. Skin/Wound Care: Routine skin checks 7. Fluids/Electrolytes/Nutrition: I personally reviewed the patient's labs today.   -potassium now normal after stopping supplement.     8. Acute on chronic renal insufficiency. Latest creatinine 2.91    -outpt follow up             -encourage po fluids 9. Hypertension. bp's have been poorly controlled.Improving  .  -increased metoprolol to 25m xr---improved             -utilize prn clonidine for bp> 200/100 10. Hyperlipidemia. Lipitor 11. Diabetes mellitus and peripheral neuropathy. Hemoglobin A1c 6.3. Liberalized diet.  -on lantus 20 units only in AM as the pharmacist cancelled my PM order on  11/24.  Will resume pm dosing at 10 units tonight. I was using BOTH for better 24 hour control of blood sugars.       12. Klebsiella/pseudomonas Urosepsis:  cipro d/ced on 11/18 13. Watery stools: Resolved 14. Insomnia:  No issues off trazodone 15. Anemia: hgb up to 9.1. likely chronic anemia       LOS (Days) 16 A FACE TO FACE EVALUATION WAS PERFORMED  Addy Mcmannis T 08/22/2015, 8:17 AM

## 2015-08-22 NOTE — Progress Notes (Signed)
Occupational Therapy Session Note  Patient Details  Name: Angelica Beck MRN: [id number] Date of Birth: 07/21/1951  Today's Date: 08/22/2015 OT Individual Time: 1002-1101 OT Individual Time Calculation (min): 59 min    Short Term Goals: Week 2:  OT Short Term Goal 1 (Week 2): Pt will perform bathing tasks with mod A with sit<>stand at sink OT Short Term Goal 2 (Week 2): Pt will perform LB dressing tasks with mod A with sit<>stand from w/c at sink OT Short Term Goal 3 (Week 2): Pt will perform toileting tasks with mod A OT Short Term Goal 4 (Week 2): Pt will maintain standing balance with supervision for 1 minute while completing bathing/dressing tasks  Skilled Therapeutic Interventions/Progress Updates:    Pt completed toileting and toilet transfer X 2 during session with min assist.  She needed assistance with pulling brief over hips in standing as well as when attempting to pull pants up after completion of peri hygiene and bathing.  She was able to cross her LEs to apply lotion to her feet as well as socks.  Pt with limited endurance as well, needing 3-4 rest breaks after completion of toileting and attempting to pull pants over her hips. Pt with flexed posture in standing noted as well.  Therapist assisted with donning shoes and tying secondary to decreased time.  Pt left in wheelchair with call button and phone within reach.     Therapy Documentation Precautions:  Precautions Precautions: Fall Restrictions Weight Bearing Restrictions: No  Pain: Pain Assessment Pain Assessment: No/denies pain ADL: See Function Navigator for Current Functional Status.   Therapy/Group: Individual Therapy  Chevis Weisensel OTR/L 08/22/2015, 12:26 PM

## 2015-08-23 ENCOUNTER — Inpatient Hospital Stay (HOSPITAL_COMMUNITY): Payer: Medicare Other | Admitting: Occupational Therapy

## 2015-08-23 ENCOUNTER — Inpatient Hospital Stay (HOSPITAL_COMMUNITY): Payer: Medicare Other | Admitting: Speech Pathology

## 2015-08-23 ENCOUNTER — Inpatient Hospital Stay (HOSPITAL_COMMUNITY): Payer: Medicare Other | Admitting: Physical Therapy

## 2015-08-23 LAB — GLUCOSE, CAPILLARY
Glucose-Capillary: 94 mg/dL (ref 65–99)
Glucose-Capillary: 177 mg/dL — ABNORMAL HIGH (ref 65–99)
Glucose-Capillary: 151 mg/dL — ABNORMAL HIGH (ref 65–99)
Glucose-Capillary: 110 mg/dL — ABNORMAL HIGH (ref 65–99)

## 2015-08-23 MED ORDER — CAMPHOR-MENTHOL 0.5-0.5 % EX LOTN
TOPICAL_LOTION | Freq: Two times a day (BID) | CUTANEOUS | Status: DC
  Administered 2015-08-23 – 2015-08-24 (×2): via TOPICAL
  Filled 2015-08-23: qty 222

## 2015-08-23 MED ORDER — DIPHENHYDRAMINE HCL 12.5 MG/5ML PO ELIX
12.5000 mg | ORAL_SOLUTION | Freq: Four times a day (QID) | ORAL | Status: DC | PRN
  Administered 2015-08-23: 12.5 mg via ORAL
  Filled 2015-08-23: qty 10

## 2015-08-23 NOTE — Plan of Care (Signed)
Problem: RH Balance Goal: LTG Patient will maintain dynamic standing with ADLs (OT) LTG: Patient will maintain dynamic standing balance with assist during activities of daily living (OT)  Outcome: Not Met (add Reason) Pt requires occasional steady assist

## 2015-08-23 NOTE — Progress Notes (Signed)
Subjective/Complaints: No new problems overnight. In good spirits today. Anxious to get home. ROS: Denies CP, SOB, N/V/D, anxiety, insomnia, visual issues  Objective: Vital Signs: Blood pressure 137/65, pulse 91, temperature 97.7 F (36.5 C), temperature source Oral, resp. rate 18, height _0  (1.702 m), weight 99.6 kg (219 lb 9.3 oz), SpO2 100 %. No results found. Results for orders placed or performed during the hospital encounter of 08/06/15 (from the past 72 hour(s))  Glucose, capillary     Status: Abnormal   Collection Time: 08/20/15 11:37 AM  Result Value Ref Range   Glucose-Capillary 160 (H) 65 - 99 mg/dL  Glucose, capillary     Status: Abnormal   Collection Time: 08/20/15  4:25 PM  Result Value Ref Range   Glucose-Capillary 160 (H) 65 - 99 mg/dL  Glucose, capillary     Status: Abnormal   Collection Time: 08/20/15  8:44 PM  Result Value Ref Range   Glucose-Capillary 146 (H) 65 - 99 mg/dL   Comment 1 Notify RN   Basic metabolic panel     Status: Abnormal   Collection Time: 08/21/15  5:42 AM  Result Value Ref Range   Sodium 138 135 - 145 mmol/L   Potassium 4.2 3.5 - 5.1 mmol/L   Chloride 104 101 - 111 mmol/L   CO2 26 22 - 32 mmol/L   Glucose, Bld 118 (H) 65 - 99 mg/dL   BUN 15 6 - 20 mg/dL   Creatinine, Ser 2.91 (H) 0.44 - 1.00 mg/dL   Calcium 9.4 8.9 - 10.3 mg/dL   GFR calc non Af Amer 16 (L) >60 mL/min   GFR calc Af Amer 19 (L) >60 mL/min    Comment: (NOTE) The eGFR has been calculated using the CKD EPI equation. This calculation has not been validated in all clinical situations. eGFR's persistently <60 mL/min signify possible Chronic Kidney Disease.    Anion gap 8 5 - 15  Glucose, capillary     Status: Abnormal   Collection Time: 08/21/15  6:45 AM  Result Value Ref Range   Glucose-Capillary 107 (H) 65 - 99 mg/dL  Occult blood card to lab, stool RN will collect     Status: None   Collection Time: 08/21/15 10:31 AM  Result Value Ref Range   Fecal Occult Bld  NEGATIVE NEGATIVE  Glucose, capillary     Status: Abnormal   Collection Time: 08/21/15 11:31 AM  Result Value Ref Range   Glucose-Capillary 202 (H) 65 - 99 mg/dL  Glucose, capillary     Status: Abnormal   Collection Time: 08/21/15  4:29 PM  Result Value Ref Range   Glucose-Capillary 173 (H) 65 - 99 mg/dL  Glucose, capillary     Status: Abnormal   Collection Time: 08/21/15  9:07 PM  Result Value Ref Range   Glucose-Capillary 204 (H) 65 - 99 mg/dL   Comment 1 Notify RN   Glucose, capillary     Status: Abnormal   Collection Time: 08/22/15  6:27 AM  Result Value Ref Range   Glucose-Capillary 119 (H) 65 - 99 mg/dL   Comment 1 Notify RN   Glucose, capillary     Status: Abnormal   Collection Time: 08/22/15 11:16 AM  Result Value Ref Range   Glucose-Capillary 186 (H) 65 - 99 mg/dL  Glucose, capillary     Status: Abnormal   Collection Time: 08/22/15  4:17 PM  Result Value Ref Range   Glucose-Capillary 218 (H) 65 - 99 mg/dL  Glucose, capillary  Status: Abnormal   Collection Time: 08/22/15  9:10 PM  Result Value Ref Range   Glucose-Capillary 110 (H) 65 - 99 mg/dL  Glucose, capillary     Status: None   Collection Time: 08/23/15  7:06 AM  Result Value Ref Range   Glucose-Capillary 94 65 - 99 mg/dL     Gen: NAD. Vital signs reviewed HEENT: Normocephalic, atraumatic Cardio: RRR. Resp: CTA B/L and unlabored GI: BS positive and NT, ND Neuro: Alert.  Oriented to person and place. Left facial droop.   Motor antigravity strength through out, but weaker on left Musc/Skel:  No edema. No tenderness. Skin:   Intact. Warm and dry.  Assessment/Plan:  1. Functional deficits secondary to acute encephalopathy which require 3+ hours per day of interdisciplinary therapy in a comprehensive inpatient rehab setting. Physiatrist is providing close team supervision and 24 hour management of active medical problems listed below. Physiatrist and rehab team continue to assess barriers to  discharge/monitor patient progress toward functional and medical goals. FIM: Function - Bathing Bathing activity did not occur: Refused Position: Wheelchair/chair at sink Body parts bathed by patient: Right arm, Left arm, Chest, Abdomen, Front perineal area, Buttocks, Right upper leg, Left upper leg Body parts bathed by helper: Back Bathing not applicable: Right lower leg, Left lower leg (Did not attempt washing LEs) Assist Level: Touching or steadying assistance(Pt > 75%)  Function- Upper Body Dressing/Undressing Upper body dressing/undressing activity did not occur: Refused What is the patient wearing?: Bra, Pull over shirt/dress Bra - Perfomed by patient: Thread/unthread right bra strap, Thread/unthread left bra strap Bra - Perfomed by helper: Hook/unhook bra (pull down sports bra) Pull over shirt/dress - Perfomed by patient: Thread/unthread right sleeve, Thread/unthread left sleeve, Put head through opening, Pull shirt over trunk Pull over shirt/dress - Perfomed by helper: Thread/unthread right sleeve, Thread/unthread left sleeve, Put head through opening, Pull shirt over trunk Assist Level: Set up, Supervision or verbal cues Set up : To obtain clothing/put away Function - Lower Body Dressing/Undressing Lower body dressing/undressing activity did not occur: Refused What is the patient wearing?: Non-skid slipper socks, Socks, Shoes, Underwear Position: Sitting EOB Underwear - Performed by patient: Thread/unthread right underwear leg, Thread/unthread left underwear leg Underwear - Performed by helper: Pull underwear up/down Pants- Performed by patient: Thread/unthread right pants leg, Thread/unthread left pants leg Pants- Performed by helper: Pull pants up/down Non-skid slipper socks- Performed by patient: Don/doff right sock, Don/doff left sock Non-skid slipper socks- Performed by helper: Don/doff right sock, Don/doff left sock Socks - Performed by patient: Don/doff right sock,  Don/doff left sock Socks - Performed by helper: Don/doff right sock, Don/doff left sock Shoes - Performed by patient: Don/doff right shoe Shoes - Performed by helper: Fasten left, Fasten right, Don/doff left shoe, Don/doff right shoe (Assisted secondary to decreased time) Assist for footwear: Partial/moderate assist Assist for lower body dressing:  (max a)  Function - Toileting Toileting activity did not occur: N/A (voiced no need) Toileting steps completed by patient: Performs perineal hygiene, Adjust clothing prior to toileting Toileting steps completed by helper: Adjust clothing prior to toileting, Adjust clothing after toileting, Performs perineal hygiene Toileting Assistive Devices: Grab bar or rail Assist level: Supervision or verbal cues  Function - Air cabin crew transfer activity did not occur: N/A (voiced no need) Toilet transfer assistive device: Elevated toilet seat/BSC over toilet, Grab bar Assist level to toilet: Supervision or verbal cues Assist level from toilet: Supervision or verbal cues  Function - Chair/bed transfer Chair/bed transfer method:  Ambulatory Chair/bed transfer assist level: Supervision or verbal cues Chair/bed transfer assistive device: Armrests, Walker Chair/bed transfer details: Verbal cues for technique  Function - Locomotion: Wheelchair Will patient use wheelchair at discharge?: Yes Type: Manual Max wheelchair distance: 2q00 Assist Level: Supervision or verbal cues Assist Level: Supervision or verbal cues Assist Level: Supervision or verbal cues Turns around,maneuvers to table,bed, and toilet,negotiates 3% grade,maneuvers on rugs and over doorsills: No Function - Locomotion: Ambulation Ambulation activity did not occur: Safety/medical concerns (diarrhea/weakness) Assistive device: Walker-rolling Max distance: 40 Assist level: Supervision or verbal cues Assist level: Supervision or verbal cues Walk 50 feet with 2 turns activity did  not occur: Safety/medical concerns Assist level: Touching or steadying assistance (Pt > 75%) Walk 150 feet activity did not occur: Safety/medical concerns Walk 10 feet on uneven surfaces activity did not occur: Safety/medical concerns  Function - Comprehension Comprehension: Auditory Comprehension assist level: Understands basic 90% of the time/cues < 10% of the time  Function - Expression Expression: Verbal Expression assist level: Expresses basic 90% of the time/requires cueing < 10% of the time.  Function - Social Interaction Social Interaction assist level: Interacts appropriately 75 - 89% of the time - Needs redirection for appropriate language or to initiate interaction.  Function - Problem Solving Problem solving assist level: Solves basic 75 - 89% of the time/requires cueing 10 - 24% of the time  Function - Memory Memory assist level: Recognizes or recalls 75 - 89% of the time/requires cueing 10 - 24% of the time Patient normally able to recall (first 3 days only): That he or she is in a hospital, Staff names and faces, Current season  Medical Problem List and Plan: 1. Functional deficits secondary to acute encephalopathy due to urosepsis with prior history of CVA (right pons, basal ganglia infarcts)  -continue CIR therapies 2.  DVT Prophylaxis/Anticoagulation: Subcutaneous heparin indicated. Monitor platelet counts and any signs of bleeding 3. Pain Management: Tylenol as needed 4. Dysphagia. Dysphagia #3 thin liquids. --eating well             5. Neuropsych: This patient is capable of making decisions on her own behalf. 6. Skin/Wound Care: Routine skin checks 7. Fluids/Electrolytes/Nutrition: I personally reviewed the patient's labs today.   -potassium now normal after stopping supplement.     8. Acute on chronic renal insufficiency. Latest creatinine 2.91    -outpt follow up             -encourage po fluids 9. Hypertension. bp's have been poorly controlled.Improving   .  -increased metoprolol to 3m xr---improvement overall             - prn clonidine for bp> 200/100 10. Hyperlipidemia. Lipitor 11. Diabetes mellitus and peripheral neuropathy. Hemoglobin A1c 6.3. Liberalized diet.  -on lantus 20u am and 10u pm currently--follow for pattern today      12. Klebsiella/pseudomonas Urosepsis:  cipro d/ced on 11/18 13. Watery stools: Resolved 14. Insomnia:  No issues off trazodone 15. Anemia: hgb up to 9.1. likely chronic anemia       LOS (Days) 17 A FACE TO FACE EVALUATION WAS PERFORMED  SWARTZ,ZACHARY T 08/23/2015, 8:33 AM

## 2015-08-23 NOTE — Discharge Summary (Signed)
Discharge summary job [id number]

## 2015-08-23 NOTE — Progress Notes (Signed)
Physical Therapy Discharge Summary  Patient Details  Name: Angelica Beck MRN: [id number] Date of Birth: [birth year]  Today's Date: 08/23/2015 PT Individual Time: 1100-1155 PT Individual Time Calculation (min): 55 min    Patient has met 10 of 11 long term goals due to improved activity tolerance, improved balance, increased strength, improved awareness and improved coordination.  Patient to discharge at short distance ambulation and long distance w/c  level supervision/min assist.   Patient's care partner is independent to provide the necessary physical assistance at discharge.  Reasons goals not met: Pt continues to require significant assist and max multimodal cues for bed mobility per report from OT's afternoon session.    Recommendation:  Patient will benefit from ongoing skilled PT services in home health setting to continue to advance safe functional mobility, address ongoing impairments in strength, coordination, balance, and endurance, and minimize fall risk.  Equipment: No equipment provided. Pt has all needed equipment.   Reasons for discharge: treatment goals met  Patient/family agrees with progress made and goals achieved: Yes   Skilled Therapeutic Intervention: Pt received resting in w/c and agreeable to therapy session.  Session focused on reassessment of strength, ROM, balance, coordination, and functional mobility in preparation for d/c home.  PT instructed patient in functional transfers with assist ranging from supervision initially to min assist from lower surfaces with fatigue at end of session.  Pt demonstrates good safety awareness and requires minimal cuing for safe positioning of self and RW in preparation for stand>sit.  Pt amb short distances with RW and supervision with verbal cues for step length. PT instructed patient in stair negotiation x8 steps with L hand rail to simulate home entry with min assist for balance and verbal cues for sequencing.  Pt returned to  room and positioned in w/c at end of session with call bell in reach and needs met.   PT Discharge Precautions/Restrictions Precautions Precautions: Fall Restrictions Weight Bearing Restrictions: No Pain Pain Assessment Pain Assessment: No/denies pain  Cognition Overall Cognitive Status: History of cognitive impairments - at baseline Arousal/Alertness: Awake/alert Orientation Level: Oriented to person;Oriented to place;Oriented to situation (oriented to end of november for month but no date or year) Attention: Sustained Sustained Attention: Appears intact Memory: Impaired Memory Impairment: Decreased short term memory;Decreased recall of new information;Storage deficit Awareness: Appears intact Safety/Judgment: Appears intact Sensation Sensation Light Touch: Impaired Detail Light Touch Impaired Details: Impaired RLE (reports decreased but not absent to light touch in R lateral thigh) Coordination Gross Motor Movements are Fluid and Coordinated: No Fine Motor Movements are Fluid and Coordinated: Yes Heel Shin Test: reduced accuracy and speed, L>R Motor  Motor Motor: Hemiplegia;Motor impersistence Motor - Discharge Observations: weakness improving from time of eval, ongoing LLE strength and coordination deficits  Mobility Transfers Transfers: Yes Sit to Stand: 5: Supervision Sit to Stand Details: Verbal cues for technique;Verbal cues for safe use of DME/AE Stand to Sit: 5: Supervision Stand to Sit Details (indicate cue type and reason): Verbal cues for technique;Verbal cues for safe use of DME/AE Locomotion  Ambulation Ambulation: Yes Ambulation/Gait Assistance: 4: Min guard;4: Min assist Ambulation Distance (Feet): 50 Feet Assistive device: Rolling walker Gait Gait: Yes Gait Pattern: Impaired Gait Pattern: Step-to pattern;Decreased stride length;Decreased stance time - left;Left flexed knee in stance;Poor foot clearance - left Stairs / Additional Locomotion Stairs:  Yes Stairs Assistance: 4: Min assist Stairs Assistance Details: Visual cues/gestures for sequencing;Verbal cues for sequencing;Verbal cues for technique;Verbal cues for safe use of DME/AE;Verbal cues for gait pattern Stair  Management Technique: One rail Left Number of Stairs: 8 Height of Stairs: 3 Wheelchair Mobility Wheelchair Mobility: Yes Wheelchair Assistance: 5: Careers information officer: Both lower extermities Wheelchair Parts Management: Needs assistance Distance: 150  Trunk/Postural Assessment  Cervical Assessment Cervical Assessment: Within Functional Limits Thoracic Assessment Thoracic Assessment: Exceptions to [facility] (rounded shoulders) Lumbar Assessment Lumbar Assessment: Exceptions to [facility] (posterior pelvic tilt in sitting) Postural Control Postural Control: Deficits on evaluation Postural Limitations: preference for L lateral lean in trunk in sitting  Balance Balance Balance Assessed: Yes Static Sitting Balance Static Sitting - Level of Assistance: 6: Modified independent (Device/Increase time) Dynamic Sitting Balance Dynamic Sitting - Level of Assistance: 6: Modified independent (Device/Increase time) Sitting balance - Comments: reaching for horseshoes in all directions with R and L hand, coronal plane and sagittal plane Static Standing Balance Static Standing - Level of Assistance: 5: Stand by assistance Dynamic Standing Balance Dynamic Standing - Balance Support: Right upper extremity supported;Left upper extremity supported Dynamic Standing - Level of Assistance: 5: Stand by assistance Dynamic Standing - Balance Activities: Lateral lean/weight shifting;Forward lean/weight shifting;Reaching for objects;Reaching for weighted objects;Reaching across midline Dynamic Standing - Comments: reaching for horseshoes to R and L placed on mat and placing them on basketball goal rim Extremity Assessment      RLE Assessment RLE Assessment: Exceptions to [facility] RLE  Strength RLE Overall Strength Comments: sitting: hip flexion 4/5, knee flexion 4/5, knee extension 4+/5, DF/PF 4/5, poor muscle endurance throughout LLE Assessment LLE Assessment: Exceptions to [facility] LLE Strength LLE Overall Strength Comments: sitting: hip flexion 3/5, knee flexion 4/5, knee extension 4/5, DF/PF 4/5, poor muscle endurance throughout   See Function Navigator for Current Functional Status.  Ezequiel Macauley E Penven-Crew 08/23/2015, 11:47 AM

## 2015-08-23 NOTE — Plan of Care (Signed)
Problem: RH Bed Mobility Goal: LTG Patient will perform bed mobility with assist (PT) LTG: Patient will perform bed mobility with assistance, with/without cues (PT).  Outcome: Not Met (add Reason) Continues to require max cuing and assist for upper body and lower body.

## 2015-08-23 NOTE — Progress Notes (Signed)
Occupational Therapy Discharge Summary and OT Intervention  Patient Details  Name: Angelica Beck MRN: [id number] Date of Birth: [birth year]  Today's Date: 08/23/2015 OT Individual Time: 4696-2952 and 1300-1330 OT Individual Time Calculation (min): 56 min and 30 min    Patient has met 6 of 7 long term goals due to improved activity tolerance, improved balance, postural control, ability to compensate for deficits, improved attention, improved awareness and improved coordination.  Patient to discharge at overall Mod Assist level.  Patient's care partner is independent to provide the necessary physical assistance at discharge.    Reasons goals not met: Pt requires steady assistance for dynamic standing balance  Recommendation:  Patient will benefit from ongoing skilled OT services in home health setting to continue to advance functional skills in the area of BADL.  Equipment: No equipment provided  Reasons for discharge: treatment goals met  Patient/family agrees with progress made and goals achieved: Yes   OT Intervention: Session 1: Upon entering the room, pt sitting on toilet with RN present in the room. Pt having BM this session and requiring assist with hygiene. Pt able to perform clothing management with steady assist and encouragement. Pt returning to sink for bathing and dressing tasks with sit <>stand from wheelchair.  Pt having urge for BM again this session and returning to bathroom. Pt ambulating 10' from sink into bathroom with use of RW and steady assist. Pt requiring mod A for toileting for hygiene. Pt performing UB bathing on toilet for time management. She required assistance to pull clothing down trunk. Pt returning to wheelchair to complete grooming tasks from sink with supervision. Call bell and all needed items within reach upon exiting the room.   Session 2: Upon entering the room, pt seated in wheelchair with no c/o pain. Pt finishing lunch and agreeable to OT  intervention with focus on bed mobility. Pt performing sit <>stand from wheelchair with supervision. Stand pivot transfer with RW from wheelchair <>bed with close supervision. Pt requiring min verbal cues for technique for supine <>sit with mod A and use of bed rail. Pt able to roll L <>R with supervision and use of bed rail. Pt transferring back into wheelchair in same manner as above. Pt remained in wheelchair with all needs within reach upon exiting the room.   OT Discharge Precautions/Restrictions  Precautions Precautions: Fall Restrictions Weight Bearing Restrictions: No   Pain Pain Assessment Pain Assessment: No/denies pain Pain Score: 0-No pain Vision/Perception  Vision- History Baseline Vision/History: No visual deficits Patient Visual Report: No change from baseline  Cognition Overall Cognitive Status: History of cognitive impairments - at baseline Arousal/Alertness: Awake/alert Orientation Level: Oriented to person;Oriented to place;Oriented to situation Attention: Sustained Sustained Attention: Appears intact Memory: Impaired Memory Impairment: Decreased short term memory;Decreased recall of new information;Storage deficit Awareness: Appears intact Safety/Judgment: Appears intact Sensation Sensation Light Touch: Impaired Detail Light Touch Impaired Details: Impaired RLE Coordination Gross Motor Movements are Fluid and Coordinated: No Fine Motor Movements are Fluid and Coordinated: Yes Heel Shin Test: reduced accuracy and speed, L>R Motor  Motor Motor: Hemiplegia;Motor impersistence Motor - Discharge Observations: generalized weakness and coordination deficits Mobility  Transfers Sit to Stand: 5: Supervision Sit to Stand Details: Verbal cues for technique;Verbal cues for safe use of DME/AE Stand to Sit: 5: Supervision Stand to Sit Details (indicate cue type and reason): Verbal cues for technique;Verbal cues for safe use of DME/AE  Trunk/Postural Assessment   Cervical Assessment Cervical Assessment: Within Functional Limits Thoracic Assessment Thoracic Assessment: Exceptions to  WFL (forward shoulder) Lumbar Assessment Lumbar Assessment: Exceptions to [facility] (posterior pelvic tilt) Postural Control Postural Control: Deficits on evaluation Postural Limitations: preference for L lateral lean in trunk in sitting  Balance Balance Balance Assessed: Yes Static Sitting Balance Static Sitting - Level of Assistance: 6: Modified independent (Device/Increase time) Dynamic Sitting Balance Dynamic Sitting - Level of Assistance: 6: Modified independent (Device/Increase time) Sitting balance - Comments: reaching for horseshoes in all directions with R and L hand, coronal plane and sagittal plane Static Standing Balance Static Standing - Level of Assistance: 5: Stand by assistance Dynamic Standing Balance Dynamic Standing - Balance Support: Right upper extremity supported;Left upper extremity supported Dynamic Standing - Level of Assistance: 5: Stand by assistance Dynamic Standing - Balance Activities: Lateral lean/weight shifting;Forward lean/weight shifting;Reaching for objects;Reaching for weighted objects;Reaching across midline Dynamic Standing - Comments: reaching for horseshoes to R and L placed on mat and placing them on basketball goal rim Extremity/Trunk Assessment RUE Assessment RUE Assessment: Within Functional Limits LUE Assessment LUE Assessment: Within Functional Limits   See Function Navigator for Current Functional Status.  Phineas Semen 08/23/2015, 12:46 PM

## 2015-08-23 NOTE — Progress Notes (Signed)
Speech Language Pathology Session Note & Discharge Summary  Patient Details  Name: Angelica Beck MRN: [id number] Date of Birth: 08/15/1951  Today's Date: 08/23/2015 SLP Individual Time: 1400-1500 SLP Individual Time Calculation (min): 60 min   Skilled Therapeutic Interventions:  Skilled treatment session focused on cognitive and speech goals. SLP facilitated session by providing extra time for patient to propel her wheelchair to and from the gym. SLP also facilitated session by providing Min A verbal cues for functional problem solving during a basic money management task.  Patient also participated in a functional conversation and was 100% intelligible with Mod I in a mildly distracting enviornment. Patient independently requested to use the bathroom and required supervision verbal cues for safety with task. Patient left upright in wheelchair with all needs within reach. Continue with current plan of care.   Patient has met 3 of 3 long term goals.  Patient to discharge at overall Supervision;Min;Modified Independent level.   Reasons goals not met: N/A   Clinical Impression/Discharge Summary: Patient has made functional gains and has met 3 of 3 LTG's this admission due to increased swallowing and cognitive function and overall speech intelligibility. Currently, patient is consuming Dys. 3 textures with thin liquids without overt s/s of aspiration and is Mod I for use of swallowing compensatory strategies. Patient is 100% intelligible at the sentence level and requires overall supervision verbal cues for use speech intelligibility strategies. Patient also requires overall Min A for functional problem solving and recall of functional information. Patient and family education is complete and patient will discharge home with 24 hour supervision. Suspect patient is close to her baseline level of functioning, therefore, skilled f/u SLP services are not warranted at this time.   Care Partner:  Caregiver  Able to Provide Assistance: Yes  Type of Caregiver Assistance: Physical;Cognitive  Recommendation:  24 hour supervision/assistance;Home Health SLP  Rationale for SLP Follow Up: Maximize cognitive function and independence;Reduce caregiver burden;Maximize functional communication   Equipment: N/A   Reasons for discharge: Treatment goals met;Discharged from hospital   Patient/Family Agrees with Progress Made and Goals Achieved: Yes   Function:  Cognition Comprehension Comprehension assist level: Understands basic 90% of the time/cues < 10% of the time  Expression   Expression assist level: Expresses basic 90% of the time/requires cueing < 10% of the time.  Social Interaction Social Interaction assist level: Interacts appropriately 75 - 89% of the time - Needs redirection for appropriate language or to initiate interaction.  Problem Solving Problem solving assist level: Solves basic 90% of the time/requires cueing < 10% of the time  Memory Memory assist level: Recognizes or recalls 75 - 89% of the time/requires cueing 10 - 24% of the time   Elpidio Thielen 08/23/2015, 4:24 PM

## 2015-08-23 NOTE — Discharge Instructions (Signed)
Inpatient Rehab Discharge Instructions  Shakiera Edelson Discharge date and time: No discharge date for patient encounter.   Activities/Precautions/ Functional Status: Activity: activity as tolerated Diet: Mechanical soft with diabetic restrictions Wound Care: none needed Functional status:  ___ No restrictions     ___ Walk up steps independently ___ 24/7 supervision/assistance   ___ Walk up steps with assistance ___ Intermittent supervision/assistance  ___ Bathe/dress independently ___ Walk with walker     ___ Bathe/dress with assistance ___ Walk Independently    ___ Shower independently _x__ Walk with assistance    ___ Shower with assistance ___ No alcohol     ___ Return to work/school ________      COMMUNITY REFERRALS UPON DISCHARGE:    Home Health:   PT     OT                     Agency:  [place] Phone: [phone]   Medical Equipment/Items Ordered:  Wheelchair, rolling walker                                                      Agency/Supplier:  [facility] @ [phone]      Special Instructions:    My questions have been answered and I understand these instructions. I will adhere to these goals and the provided educational materials after my discharge from the hospital.  Patient/Caregiver Signature _______________________________ Date __________  Clinician Signature _______________________________________ Date __________  Please bring this form and your medication list with you to all your follow-up doctor's appointments.

## 2015-08-23 NOTE — Discharge Summary (Signed)
NAME:  KRISINDA, GIOVANNI NO.:  [id number]  MEDICAL RECORD NO.:  [id number]  LOCATION:  [id number]                        FACILITY:  [facility]  PHYSICIAN:  Meredith Staggers, M.D.DATE OF BIRTH:  November 22, 1950  DATE OF ADMISSION:  08/06/2015 DATE OF DISCHARGE:  08/24/2015                              DISCHARGE SUMMARY   DISCHARGE DIAGNOSES: 1. Functional deficits secondary to acute encephalopathy due to     urosepsis with history of CVA. 2. Subcutaneous heparin for DVT prophylaxis. 3. Dysphagia. 4. Acute on chronic renal insufficiency. 5. Hypertension. 6. Hyperlipidemia. 7. Diabetes mellitus with peripheral neuropathy. 8. Insomnia.  HISTORY OF PRESENT ILLNESS:  This is a 64 year old right-handed female with history of hypertension, CVA with short-term memory deficits, diabetes mellitus, chronic renal insufficiency with creatinine 1.56, lives with daughter, has a home health aide.  Over the past few weeks, she needed assistance with feeding.  She presented on August 02, 2015 with nonproductive cough, hypotension, altered mental status, and restlessness.  Noted creatinine 6.06 from baseline 1.56.  Cranial CT scan showed atrophy, widespread abnormal attenuation in the supratentorial white matter as well as lateral left cerebellum and right mid pontine region.  Prior infarcts in the anterior left parietal lobe. No other acute changes.  Chest x-ray, negative pneumonia.  Abdominal films and renal ultrasound were unremarkable.  EEG showed no seizure. Renal insufficiency felt likely to ATN due to hypotension, possible sepsis, creatinine improving to 4.1.  Urine culture, Klebsiella as well as Pseudomonas, maintained on Azactam.  Blood cultures negative. Continues on Plavix for CVA prophylaxis.  Subcutaneous heparin for DVT prophylaxis.  Physical and occupational therapy ongoing.  The patient was admitted for comprehensive rehab program.  PAST MEDICAL HISTORY:  See discharge  diagnoses.  SOCIAL HISTORY:  Lives with family, has a personal care attendant, used a rolling walker prior to admission.  Functional status upon admission to rehab services; minimal assist, stand pivot transfers; moderate assist, sit to supine, supine to sit; mod to max assist, activities of daily living.  PHYSICAL EXAMINATION:  VITAL SIGNS:  Blood pressure 169/94, pulse 104, temperature 99, respirations 18. GENERAL:  This was an alert female.  Speech is dysarthric but intelligible. LUNGS:  Clear to auscultation without wheeze. CARDIAC:  Regular rhythm and rate without murmur. ABDOMEN:  Soft, nontender.  Good bowel sounds.  REHABILITATION HOSPITAL COURSE:  The patient was admitted to Inpatient Rehab Services with therapies initiated on a 3-hour daily basis consisting of physical therapy, occupational therapy, speech therapy, and rehabilitation nursing.  The following issues were addressed during the patient's rehabilitation stay.  Pertaining to Ms. Dueitt's acute encephalopathy due to urosepsis, she remained on Plavix for history of CVA.  The patient remained afebrile.  She had completed a course of Cipro for Klebsiella Pseudomonas urinary tract infection.  Subcutaneous heparin for DVT prophylaxis.  No bleeding episodes.  Her diet was a mechanical soft, thin liquids, monitored closely of hydration with history of acute on chronic renal insufficiency and a baseline creatinine of 1.56.  Creatinine is steadily improving, latest of 2.91. Initially, it was felt that elevated creatinine secondary to ATN due to hypotension.  Blood pressure is overall well controlled.  Diabetes mellitus, peripheral neuropathy.  Hemoglobin A1c of 6.3.  She remained on low-dose Lantus insulin with full diabetic teaching.  The patient received weekly collaborative interdisciplinary team conferences to discuss estimated length of stay, family teaching, any barriers to her discharge.  The patient propelled  wheelchair supervision, ambulated with rolling walker, short functional distances with supervision, stair training with rails on supervision.  Simulated car transfers.  Rolling walker.  Minimal assist to stand from a low car seat.  The patient's daughter reporting the patient close to baseline for functional mobility.  She could gather her belongings for activities of daily living and homemaking, needing assistance with pulling her briefs over the hips and standing position.  Limited endurance with energy conservation techniques.  Full family teaching was completed and plan discharge to home.  DISCHARGE MEDICATIONS:  Lipitor 40 mg p.o. daily, Plavix 75 mg p.o. daily, Pepcid 10 mg p.o. b.i.d., Neurontin 100 mg p.o. at bedtime, Lantus insulin 20 units daily and 10 units at bedtime, Toprol-XL 75 mg p.o. daily, multivitamin 1 tablet daily, niacin 1000 mg p.o. at bedtime, PTU 50 mg p.o. b.i.d.  DIET:  Mechanical soft, diabetic restrictions.  FOLLOWUP INSTRUCTIONS:  The patient is to follow up with Dr. Alger Simons at the Outpatient Rehab Service Office as directed; Dr. Delia Chimes, Medical Management.     Lauraine Rinne, P.A.   ______________________________ Meredith Staggers, M.D.    DA/MEDQ  D:  08/23/2015  T:  08/23/2015  Job:  [id number]  cc:   Dr. Delia Chimes

## 2015-08-24 LAB — GLUCOSE, CAPILLARY
GLUCOSE-CAPILLARY: 154 mg/dL — AB (ref 65–99)
Glucose-Capillary: 115 mg/dL — ABNORMAL HIGH (ref 65–99)
Glucose-Capillary: 81 mg/dL (ref 65–99)

## 2015-08-24 MED ORDER — ROSUVASTATIN CALCIUM 40 MG PO TABS: 40.0000 mg | ORAL_TABLET | Freq: Every day | ORAL | Status: DC

## 2015-08-24 MED ORDER — GABAPENTIN 100 MG PO CAPS: 100.0000 mg | ORAL_CAPSULE | Freq: Every day | ORAL | Status: DC

## 2015-08-24 MED ORDER — ADULT MULTIVITAMIN W/MINERALS CH: 1.0000 | ORAL_TABLET | Freq: Every day | ORAL | Status: DC

## 2015-08-24 MED ORDER — PROPYLTHIOURACIL 50 MG PO TABS: 50.0000 mg | ORAL_TABLET | Freq: Two times a day (BID) | ORAL | Status: DC

## 2015-08-24 MED ORDER — CLOPIDOGREL BISULFATE 75 MG PO TABS: 75.0000 mg | ORAL_TABLET | Freq: Every day | ORAL | Status: DC

## 2015-08-24 MED ORDER — INSULIN GLARGINE 100 UNIT/ML ~~LOC~~ SOLN: SUBCUTANEOUS | Status: DC

## 2015-08-24 MED ORDER — NIACIN ER (ANTIHYPERLIPIDEMIC) 1000 MG PO TBCR: 1000.0000 mg | EXTENDED_RELEASE_TABLET | Freq: Every day | ORAL | Status: DC

## 2015-08-24 MED ORDER — METOPROLOL SUCCINATE ER 25 MG PO TB24: 75.0000 mg | ORAL_TABLET | Freq: Every day | ORAL | Status: DC

## 2015-08-24 NOTE — Progress Notes (Signed)
Subjective/Complaints: No complaints. Happy that she's going home. Pleased with her rehab progress. ROS: Denies CP, SOB, N/V/D, anxiety, insomnia, visual issues  Objective: Vital Signs: Blood pressure 126/47, pulse 98, temperature 98.9 F (37.2 C), temperature source Oral, resp. rate 18, height '5\' 7"'$  (1.702 m), weight 99.4 kg (219 lb 2.2 oz), SpO2 100 %. No results found. Results for orders placed or performed during the hospital encounter of 08/06/15 (from the past 72 hour(s))  Occult blood card to lab, stool RN will collect     Status: None   Collection Time: 08/21/15 10:31 AM  Result Value Ref Range   Fecal Occult Bld NEGATIVE NEGATIVE  Glucose, capillary     Status: Abnormal   Collection Time: 08/21/15 11:31 AM  Result Value Ref Range   Glucose-Capillary 202 (H) 65 - 99 mg/dL  Glucose, capillary     Status: Abnormal   Collection Time: 08/21/15  4:29 PM  Result Value Ref Range   Glucose-Capillary 173 (H) 65 - 99 mg/dL  Glucose, capillary     Status: Abnormal   Collection Time: 08/21/15  9:07 PM  Result Value Ref Range   Glucose-Capillary 204 (H) 65 - 99 mg/dL   Comment 1 Notify RN   Glucose, capillary     Status: Abnormal   Collection Time: 08/22/15  6:27 AM  Result Value Ref Range   Glucose-Capillary 119 (H) 65 - 99 mg/dL   Comment 1 Notify RN   Glucose, capillary     Status: Abnormal   Collection Time: 08/22/15 11:16 AM  Result Value Ref Range   Glucose-Capillary 186 (H) 65 - 99 mg/dL  Glucose, capillary     Status: Abnormal   Collection Time: 08/22/15  4:17 PM  Result Value Ref Range   Glucose-Capillary 218 (H) 65 - 99 mg/dL  Glucose, capillary     Status: Abnormal   Collection Time: 08/22/15  9:10 PM  Result Value Ref Range   Glucose-Capillary 110 (H) 65 - 99 mg/dL  Glucose, capillary     Status: None   Collection Time: 08/23/15  7:06 AM  Result Value Ref Range   Glucose-Capillary 94 65 - 99 mg/dL  Glucose, capillary     Status: Abnormal   Collection Time:  08/23/15 12:25 PM  Result Value Ref Range   Glucose-Capillary 177 (H) 65 - 99 mg/dL  Glucose, capillary     Status: Abnormal   Collection Time: 08/23/15  4:47 PM  Result Value Ref Range   Glucose-Capillary 151 (H) 65 - 99 mg/dL  Glucose, capillary     Status: Abnormal   Collection Time: 08/23/15  9:24 PM  Result Value Ref Range   Glucose-Capillary 110 (H) 65 - 99 mg/dL   Comment 1 Notify RN   Glucose, capillary     Status: Abnormal   Collection Time: 08/24/15  6:54 AM  Result Value Ref Range   Glucose-Capillary 115 (H) 65 - 99 mg/dL   Comment 1 Notify RN      Gen: NAD. Vital signs reviewed HEENT: Normocephalic, atraumatic Cardio: RRR. Resp: CTA B/L and unlabored GI: BS positive and NT, ND Neuro: Alert.  Oriented to person and place. Left facial droop.   Motor antigravity strength through out, but weaker on left Musc/Skel:  No edema. No tenderness. Skin:   Intact. Warm and dry.  Assessment/Plan:  1. Functional deficits secondary to acute encephalopathy which require 3+ hours per day of interdisciplinary therapy in a comprehensive inpatient rehab setting. Physiatrist is providing close team supervision  and 24 hour management of active medical problems listed below. Physiatrist and rehab team continue to assess barriers to discharge/monitor patient progress toward functional and medical goals. FIM: Function - Bathing Bathing activity did not occur: Refused Position: Wheelchair/chair at sink Body parts bathed by patient: Right arm, Left arm, Chest, Abdomen, Front perineal area, Buttocks, Right upper leg, Left upper leg Body parts bathed by helper: Right lower leg, Left lower leg Bathing not applicable: Back Assist Level: Touching or steadying assistance(Pt > 75%)  Function- Upper Body Dressing/Undressing Upper body dressing/undressing activity did not occur: Refused What is the patient wearing?: Bra, Pull over shirt/dress Bra - Perfomed by patient: Thread/unthread right bra  strap, Thread/unthread left bra strap Bra - Perfomed by helper: Hook/unhook bra (pull down sports bra) Pull over shirt/dress - Perfomed by patient: Thread/unthread right sleeve, Thread/unthread left sleeve, Put head through opening, Pull shirt over trunk Pull over shirt/dress - Perfomed by helper: Thread/unthread right sleeve, Thread/unthread left sleeve, Put head through opening, Pull shirt over trunk Assist Level: Touching or steadying assistance(Pt > 75%) Set up : To obtain clothing/put away Function - Lower Body Dressing/Undressing Lower body dressing/undressing activity did not occur: Refused What is the patient wearing?: Socks, Shoes, Underwear, Pants Position: Wheelchair/chair at sink Underwear - Performed by patient: Thread/unthread right underwear leg, Thread/unthread left underwear leg, Pull underwear up/down Underwear - Performed by helper: Pull underwear up/down Pants- Performed by patient: Thread/unthread right pants leg, Thread/unthread left pants leg Pants- Performed by helper: Pull pants up/down Non-skid slipper socks- Performed by patient: Don/doff right sock, Don/doff left sock Non-skid slipper socks- Performed by helper: Don/doff right sock, Don/doff left sock Socks - Performed by patient: Don/doff right sock, Don/doff left sock Socks - Performed by helper: Don/doff right sock, Don/doff left sock Shoes - Performed by patient: Don/doff right shoe Shoes - Performed by helper: Fasten left, Fasten right, Don/doff left shoe, Don/doff right shoe Assist for footwear: Partial/moderate assist Assist for lower body dressing:  (mod a)  Function - Toileting Toileting activity did not occur: N/A (voiced no need) Toileting steps completed by patient: Adjust clothing prior to toileting, Adjust clothing after toileting Toileting steps completed by helper: Performs perineal hygiene Toileting Assistive Devices: Grab bar or rail Assist level:  (mod a)  Function - Engineer, petroleum transfer activity did not occur: N/A (voiced no need) Toilet transfer assistive device: Elevated toilet seat/BSC over toilet, Grab bar Assist level to toilet: Supervision or verbal cues Assist level from toilet: Supervision or verbal cues  Function - Chair/bed transfer Chair/bed transfer method: Stand pivot, Ambulatory Chair/bed transfer assist level: Supervision or verbal cues Chair/bed transfer assistive device: Armrests, Walker Chair/bed transfer details: Verbal cues for technique  Function - Locomotion: Wheelchair Will patient use wheelchair at discharge?: Yes Type: Manual Max wheelchair distance: 150 Assist Level: Supervision or verbal cues Assist Level: Supervision or verbal cues Assist Level: Supervision or verbal cues Turns around,maneuvers to table,bed, and toilet,negotiates 3% grade,maneuvers on rugs and over doorsills: No Function - Locomotion: Ambulation Ambulation activity did not occur: Safety/medical concerns (diarrhea/weakness) Assistive device: Walker-rolling Max distance: 50 Assist level: Supervision or verbal cues Assist level: Supervision or verbal cues Walk 50 feet with 2 turns activity did not occur: Safety/medical concerns Assist level: Touching or steadying assistance (Pt > 75%) Walk 150 feet activity did not occur: Safety/medical concerns Walk 10 feet on uneven surfaces activity did not occur: Safety/medical concerns  Function - Comprehension Comprehension: Auditory Comprehension assist level: Understands basic 90% of the time/cues <  10% of the time  Function - Expression Expression: Verbal Expression assist level: Expresses basic 75 - 89% of the time/requires cueing 10 - 24% of the time. Needs helper to occlude trach/needs to repeat words.  Function - Social Interaction Social Interaction assist level: Interacts appropriately 75 - 89% of the time - Needs redirection for appropriate language or to initiate interaction.  Function -  Problem Solving Problem solving assist level: Solves basic 75 - 89% of the time/requires cueing 10 - 24% of the time  Function - Memory Memory assist level: Recognizes or recalls 75 - 89% of the time/requires cueing 10 - 24% of the time Patient normally able to recall (first 3 days only): That he or she is in a hospital, Staff names and faces, Current season  Medical Problem List and Plan: 1. Functional deficits secondary to acute encephalopathy due to urosepsis with prior history of CVA (right pons, basal ganglia infarcts)  -continue CIR therapies 2.  DVT Prophylaxis/Anticoagulation: Subcutaneous heparin indicated. Monitor platelet counts and any signs of bleeding 3. Pain Management: Tylenol as needed 4. Dysphagia. Dysphagia #3 thin liquids. --eating well             5. Neuropsych: This patient is capable of making decisions on her own behalf. 6. Skin/Wound Care: Routine skin checks 7. Fluids/Electrolytes/Nutrition: I personally reviewed the patient's labs today.   -potassium now normal after stopping supplement.     8. Acute on chronic renal insufficiency. Latest creatinine 2.91    -outpt follow up             -encourage po fluids 9. Hypertension. bp's have been poorly controlled.Improving  .  -increased metoprolol to '75mg'$  xr---improvement overall             - prn clonidine for bp> 200/100 10. Hyperlipidemia. Lipitor 11. Diabetes mellitus and peripheral neuropathy. Hemoglobin A1c 6.3. Needs to work on appropriate diet once home  -on lantus 20u am and 10u pm currently--discharge home on these doses---further fine adjustment as outpt      12. Klebsiella/pseudomonas Urosepsis:  cipro d/ced on 11/18 13. Watery stools: Resolved 14. Insomnia:  No issues off trazodone 15. Anemia: hgb up to 9.1. likely chronic anemia       LOS (Days) 18 A FACE TO FACE EVALUATION WAS PERFORMED  SWARTZ,ZACHARY T 08/24/2015, 8:44 AM

## 2015-08-25 DIAGNOSIS — N189 Chronic kidney disease, unspecified: Secondary | ICD-10-CM | POA: Diagnosis not present

## 2015-08-25 DIAGNOSIS — E785 Hyperlipidemia, unspecified: Secondary | ICD-10-CM | POA: Diagnosis not present

## 2015-08-25 DIAGNOSIS — R1310 Dysphagia, unspecified: Secondary | ICD-10-CM | POA: Diagnosis not present

## 2015-08-25 DIAGNOSIS — I69311 Memory deficit following cerebral infarction: Secondary | ICD-10-CM | POA: Diagnosis not present

## 2015-08-25 DIAGNOSIS — Z794 Long term (current) use of insulin: Secondary | ICD-10-CM | POA: Diagnosis not present

## 2015-08-25 DIAGNOSIS — I129 Hypertensive chronic kidney disease with stage 1 through stage 4 chronic kidney disease, or unspecified chronic kidney disease: Secondary | ICD-10-CM | POA: Diagnosis not present

## 2015-08-25 DIAGNOSIS — Z9981 Dependence on supplemental oxygen: Secondary | ICD-10-CM | POA: Diagnosis not present

## 2015-08-25 DIAGNOSIS — Z87440 Personal history of urinary (tract) infections: Secondary | ICD-10-CM | POA: Diagnosis not present

## 2015-08-25 DIAGNOSIS — E1142 Type 2 diabetes mellitus with diabetic polyneuropathy: Secondary | ICD-10-CM | POA: Diagnosis not present

## 2015-08-25 NOTE — Progress Notes (Signed)
Pt. D/ced to home in care of daughter.  Pt. verbalizes understanding of d/c instructions given by PAC.  This RN then reviewed instructions with pt's daughter. Pt and daughter state they will not be using suppository Q am as ordered. Both state "We will use the routine we did at home."  VSS, CBG 81, had snack prior to D/C.

## 2015-08-25 NOTE — Progress Notes (Signed)
Social Work  Discharge Note  The overall goal for the admission was met for:   Discharge location: Yes - home with daughter and family to provide (close to) 24/7 assistance  Length of Stay: Yes - 18 days  Discharge activity level: Yes - supervision/ min assist  Home/community participation: Yes  Services provided included: MD, RD, PT, OT, SLP, RN, TR, Pharmacy and [place]: Medicare and Medicaid  Follow-up services arranged: Home Health: PT, OT via [place], DME: 20x18 lightweight w/c, cushion, rolling walker via [facility] and Patient/Family has no preference for HH/DME agencies  Comments (or additional information):  Patient/Family verbalized understanding of follow-up arrangements: Yes  Individual responsible for coordination of the follow-up plan: pt  Confirmed correct DME delivered: Matthe Sloane 08/25/2015    Turrell Severt

## 2015-08-27 DIAGNOSIS — E1142 Type 2 diabetes mellitus with diabetic polyneuropathy: Secondary | ICD-10-CM | POA: Diagnosis not present

## 2015-08-27 DIAGNOSIS — N189 Chronic kidney disease, unspecified: Secondary | ICD-10-CM | POA: Diagnosis not present

## 2015-08-27 DIAGNOSIS — E785 Hyperlipidemia, unspecified: Secondary | ICD-10-CM | POA: Diagnosis not present

## 2015-08-27 DIAGNOSIS — I69311 Memory deficit following cerebral infarction: Secondary | ICD-10-CM | POA: Diagnosis not present

## 2015-08-27 DIAGNOSIS — R1310 Dysphagia, unspecified: Secondary | ICD-10-CM | POA: Diagnosis not present

## 2015-08-27 DIAGNOSIS — I129 Hypertensive chronic kidney disease with stage 1 through stage 4 chronic kidney disease, or unspecified chronic kidney disease: Secondary | ICD-10-CM | POA: Diagnosis not present

## 2015-08-30 DIAGNOSIS — R1310 Dysphagia, unspecified: Secondary | ICD-10-CM | POA: Diagnosis not present

## 2015-08-30 DIAGNOSIS — I129 Hypertensive chronic kidney disease with stage 1 through stage 4 chronic kidney disease, or unspecified chronic kidney disease: Secondary | ICD-10-CM | POA: Diagnosis not present

## 2015-08-30 DIAGNOSIS — E785 Hyperlipidemia, unspecified: Secondary | ICD-10-CM | POA: Diagnosis not present

## 2015-08-30 DIAGNOSIS — I69311 Memory deficit following cerebral infarction: Secondary | ICD-10-CM | POA: Diagnosis not present

## 2015-08-30 DIAGNOSIS — E1142 Type 2 diabetes mellitus with diabetic polyneuropathy: Secondary | ICD-10-CM | POA: Diagnosis not present

## 2015-08-30 DIAGNOSIS — N189 Chronic kidney disease, unspecified: Secondary | ICD-10-CM | POA: Diagnosis not present

## 2015-09-01 DIAGNOSIS — E785 Hyperlipidemia, unspecified: Secondary | ICD-10-CM | POA: Diagnosis not present

## 2015-09-01 DIAGNOSIS — N189 Chronic kidney disease, unspecified: Secondary | ICD-10-CM | POA: Diagnosis not present

## 2015-09-01 DIAGNOSIS — I129 Hypertensive chronic kidney disease with stage 1 through stage 4 chronic kidney disease, or unspecified chronic kidney disease: Secondary | ICD-10-CM | POA: Diagnosis not present

## 2015-09-01 DIAGNOSIS — E1142 Type 2 diabetes mellitus with diabetic polyneuropathy: Secondary | ICD-10-CM | POA: Diagnosis not present

## 2015-09-01 DIAGNOSIS — I69311 Memory deficit following cerebral infarction: Secondary | ICD-10-CM | POA: Diagnosis not present

## 2015-09-01 DIAGNOSIS — R1310 Dysphagia, unspecified: Secondary | ICD-10-CM | POA: Diagnosis not present

## 2015-09-03 DIAGNOSIS — R1310 Dysphagia, unspecified: Secondary | ICD-10-CM | POA: Diagnosis not present

## 2015-09-03 DIAGNOSIS — E1142 Type 2 diabetes mellitus with diabetic polyneuropathy: Secondary | ICD-10-CM | POA: Diagnosis not present

## 2015-09-03 DIAGNOSIS — I129 Hypertensive chronic kidney disease with stage 1 through stage 4 chronic kidney disease, or unspecified chronic kidney disease: Secondary | ICD-10-CM | POA: Diagnosis not present

## 2015-09-03 DIAGNOSIS — E785 Hyperlipidemia, unspecified: Secondary | ICD-10-CM | POA: Diagnosis not present

## 2015-09-03 DIAGNOSIS — N189 Chronic kidney disease, unspecified: Secondary | ICD-10-CM | POA: Diagnosis not present

## 2015-09-03 DIAGNOSIS — I69311 Memory deficit following cerebral infarction: Secondary | ICD-10-CM | POA: Diagnosis not present

## 2015-09-06 DIAGNOSIS — I69311 Memory deficit following cerebral infarction: Secondary | ICD-10-CM | POA: Diagnosis not present

## 2015-09-06 DIAGNOSIS — N189 Chronic kidney disease, unspecified: Secondary | ICD-10-CM | POA: Diagnosis not present

## 2015-09-06 DIAGNOSIS — I129 Hypertensive chronic kidney disease with stage 1 through stage 4 chronic kidney disease, or unspecified chronic kidney disease: Secondary | ICD-10-CM | POA: Diagnosis not present

## 2015-09-06 DIAGNOSIS — E1142 Type 2 diabetes mellitus with diabetic polyneuropathy: Secondary | ICD-10-CM | POA: Diagnosis not present

## 2015-09-06 DIAGNOSIS — R1310 Dysphagia, unspecified: Secondary | ICD-10-CM | POA: Diagnosis not present

## 2015-09-06 DIAGNOSIS — E785 Hyperlipidemia, unspecified: Secondary | ICD-10-CM | POA: Diagnosis not present

## 2015-09-08 DIAGNOSIS — I69311 Memory deficit following cerebral infarction: Secondary | ICD-10-CM | POA: Diagnosis not present

## 2015-09-08 DIAGNOSIS — R1310 Dysphagia, unspecified: Secondary | ICD-10-CM | POA: Diagnosis not present

## 2015-09-08 DIAGNOSIS — E785 Hyperlipidemia, unspecified: Secondary | ICD-10-CM | POA: Diagnosis not present

## 2015-09-08 DIAGNOSIS — I129 Hypertensive chronic kidney disease with stage 1 through stage 4 chronic kidney disease, or unspecified chronic kidney disease: Secondary | ICD-10-CM | POA: Diagnosis not present

## 2015-09-08 DIAGNOSIS — N189 Chronic kidney disease, unspecified: Secondary | ICD-10-CM | POA: Diagnosis not present

## 2015-09-08 DIAGNOSIS — E1142 Type 2 diabetes mellitus with diabetic polyneuropathy: Secondary | ICD-10-CM | POA: Diagnosis not present

## 2015-09-15 DIAGNOSIS — I69311 Memory deficit following cerebral infarction: Secondary | ICD-10-CM | POA: Diagnosis not present

## 2015-09-15 DIAGNOSIS — R1310 Dysphagia, unspecified: Secondary | ICD-10-CM | POA: Diagnosis not present

## 2015-09-15 DIAGNOSIS — E785 Hyperlipidemia, unspecified: Secondary | ICD-10-CM | POA: Diagnosis not present

## 2015-09-15 DIAGNOSIS — N189 Chronic kidney disease, unspecified: Secondary | ICD-10-CM | POA: Diagnosis not present

## 2015-09-15 DIAGNOSIS — I129 Hypertensive chronic kidney disease with stage 1 through stage 4 chronic kidney disease, or unspecified chronic kidney disease: Secondary | ICD-10-CM | POA: Diagnosis not present

## 2015-09-15 DIAGNOSIS — E1142 Type 2 diabetes mellitus with diabetic polyneuropathy: Secondary | ICD-10-CM | POA: Diagnosis not present

## 2015-09-22 DIAGNOSIS — E1142 Type 2 diabetes mellitus with diabetic polyneuropathy: Secondary | ICD-10-CM | POA: Diagnosis not present

## 2015-09-22 DIAGNOSIS — E785 Hyperlipidemia, unspecified: Secondary | ICD-10-CM | POA: Diagnosis not present

## 2015-09-22 DIAGNOSIS — I69311 Memory deficit following cerebral infarction: Secondary | ICD-10-CM | POA: Diagnosis not present

## 2015-09-22 DIAGNOSIS — N189 Chronic kidney disease, unspecified: Secondary | ICD-10-CM | POA: Diagnosis not present

## 2015-09-22 DIAGNOSIS — I129 Hypertensive chronic kidney disease with stage 1 through stage 4 chronic kidney disease, or unspecified chronic kidney disease: Secondary | ICD-10-CM | POA: Diagnosis not present

## 2015-09-22 DIAGNOSIS — R1310 Dysphagia, unspecified: Secondary | ICD-10-CM | POA: Diagnosis not present

## 2015-09-28 ENCOUNTER — Ambulatory Visit (INDEPENDENT_AMBULATORY_CARE_PROVIDER_SITE_OTHER): Payer: Medicare Other | Admitting: Podiatry

## 2015-09-28 DIAGNOSIS — B351 Tinea unguium: Secondary | ICD-10-CM | POA: Diagnosis not present

## 2015-09-28 DIAGNOSIS — M79676 Pain in unspecified toe(s): Secondary | ICD-10-CM

## 2015-09-28 NOTE — Progress Notes (Signed)
Patient ID: Angelica Beck, female   DOB: 1950-09-29, 65 y.o.   MRN: [id number] Complaint:  Visit Type: Patient returns to my office for continued preventative foot care services. Complaint: Patient states" my nails have grown long and thick and become painful to walk and wear shoes" Patient has been diagnosed with DM with no foot complications. The patient presents for preventative foot care services. No changes to ROS  Podiatric Exam: Vascular: dorsalis pedis and posterior tibial pulses are palpable bilateral. Capillary return is immediate. Temperature gradient is WNL. Skin turgor WNL  Sensorium: Normal Semmes Weinstein monofilament test. Normal tactile sensation bilaterally. Nail Exam: Pt has thick disfigured discolored nails with subungual debris noted bilateral entire nail hallux through fifth toenails Ulcer Exam: There is no evidence of ulcer or pre-ulcerative changes or infection. Orthopedic Exam: Muscle tone and strength are WNL. No limitations in general ROM. No crepitus or effusions noted. Foot type and digits show no abnormalities. Bony prominences are unremarkable. Skin: No Porokeratosis. No infection or ulcers  Diagnosis:  Onychomycosis, , Pain in right toe, pain in left toes  Treatment & Plan Procedures and Treatment: Consent by patient was obtained for treatment procedures. The patient understood the discussion of treatment and procedures well. All questions were answered thoroughly reviewed. Debridement of mycotic and hypertrophic toenails, 1 through 5 bilateral and clearing of subungual debris. No ulceration, no infection noted.  Return Visit-Office Procedure: Patient instructed to return to the office for a follow up visit 3 months for continued evaluation and treatment.   Gardiner Barefoot DPM

## 2015-11-29 ENCOUNTER — Telehealth: Payer: Self-pay | Admitting: Pulmonary Disease

## 2015-11-29 DIAGNOSIS — J9611 Chronic respiratory failure with hypoxia: Secondary | ICD-10-CM | POA: Insufficient documentation

## 2015-11-29 NOTE — Telephone Encounter (Signed)
Called spoke with Jeani Hawking from [facility]. Gave new DX code. She will document this. Nothing further needed

## 2015-11-29 NOTE — Telephone Encounter (Signed)
Per 04/30/15 OV: Patient Instructions       Will arrange for overnight oxygen test Follow up in 1 year  --  Called spoke with Jeani Hawking w/ [facility]. She reports pt billing for her O2 is currently on hold. We have a DX for pt O2 of OHS and insurance will no longer accept this as a diagnosis. Unable to use OSA as a diagnosis unless she has repeat sleep study showing desats during titration Unable to use SOB/dyspnea as diagnosis. Please advise Dr. Halford Chessman thanks

## 2015-11-29 NOTE — Telephone Encounter (Signed)
Try using chronic respiratory failure with hypoxia (ICD 10 J96.11)

## 2015-12-09 DIAGNOSIS — I129 Hypertensive chronic kidney disease with stage 1 through stage 4 chronic kidney disease, or unspecified chronic kidney disease: Secondary | ICD-10-CM | POA: Diagnosis not present

## 2015-12-09 DIAGNOSIS — D649 Anemia, unspecified: Secondary | ICD-10-CM | POA: Diagnosis not present

## 2015-12-09 DIAGNOSIS — E1129 Type 2 diabetes mellitus with other diabetic kidney complication: Secondary | ICD-10-CM | POA: Diagnosis not present

## 2015-12-09 DIAGNOSIS — N179 Acute kidney failure, unspecified: Secondary | ICD-10-CM | POA: Diagnosis not present

## 2015-12-09 DIAGNOSIS — R7989 Other specified abnormal findings of blood chemistry: Secondary | ICD-10-CM | POA: Diagnosis not present

## 2015-12-16 ENCOUNTER — Encounter (HOSPITAL_COMMUNITY): Payer: Medicare Other

## 2015-12-17 ENCOUNTER — Encounter (HOSPITAL_COMMUNITY): Payer: Medicare Other

## 2015-12-22 ENCOUNTER — Other Ambulatory Visit (HOSPITAL_COMMUNITY): Payer: Self-pay | Admitting: *Deleted

## 2015-12-23 ENCOUNTER — Ambulatory Visit (HOSPITAL_COMMUNITY)
Admission: RE | Admit: 2015-12-23 | Discharge: 2015-12-23 | Disposition: A | Discharge status: Home / Self Care | Payer: Medicare Other | Source: Ambulatory Visit | Attending: Nephrology | Admitting: Nephrology

## 2015-12-23 DIAGNOSIS — D509 Iron deficiency anemia, unspecified: Secondary | ICD-10-CM | POA: Diagnosis not present

## 2015-12-23 MED ORDER — SODIUM CHLORIDE 0.9 % IV SOLN
510.0000 mg | Freq: Once | INTRAVENOUS | Status: AC
  Administered 2015-12-23: 510 mg via INTRAVENOUS
  Filled 2015-12-23: qty 17

## 2015-12-28 ENCOUNTER — Ambulatory Visit (INDEPENDENT_AMBULATORY_CARE_PROVIDER_SITE_OTHER): Payer: Medicare Other | Admitting: Podiatry

## 2015-12-28 ENCOUNTER — Encounter: Payer: Self-pay | Admitting: Podiatry

## 2015-12-28 DIAGNOSIS — B351 Tinea unguium: Secondary | ICD-10-CM

## 2015-12-28 DIAGNOSIS — M79676 Pain in unspecified toe(s): Secondary | ICD-10-CM

## 2015-12-28 NOTE — Progress Notes (Signed)
Patient ID: Angelica Beck, female   DOB: 06-Jun-1951, 65 y.o.   MRN: [id number] Complaint:  Visit Type: Patient returns to my office for continued preventative foot care services. Complaint: Patient states" my nails have grown long and thick and become painful to walk and wear shoes" Patient has been diagnosed with DM with no foot complications. The patient presents for preventative foot care services. No changes to ROS  Podiatric Exam: Vascular: dorsalis pedis and posterior tibial pulses are palpable bilateral. Capillary return is immediate. Temperature gradient is WNL. Skin turgor WNL  Sensorium: Normal Semmes Weinstein monofilament test. Normal tactile sensation bilaterally. Nail Exam: Pt has thick disfigured discolored nails with subungual debris noted bilateral entire nail hallux through fifth toenails Ulcer Exam: There is no evidence of ulcer or pre-ulcerative changes or infection. Orthopedic Exam: Muscle tone and strength are WNL. No limitations in general ROM. No crepitus or effusions noted. Foot type and digits show no abnormalities. Bony prominences are unremarkable. Skin: No Porokeratosis. No infection or ulcers  Diagnosis:  Onychomycosis, , Pain in right toe, pain in left toes  Treatment & Plan Procedures and Treatment: Consent by patient was obtained for treatment procedures. The patient understood the discussion of treatment and procedures well. All questions were answered thoroughly reviewed. Debridement of mycotic and hypertrophic toenails, 1 through 5 bilateral and clearing of subungual debris. No ulceration, no infection noted.  Return Visit-Office Procedure: Patient instructed to return to the office for a follow up visit 3 months for continued evaluation and treatment.   Gardiner Barefoot DPM

## 2016-01-04 DIAGNOSIS — D649 Anemia, unspecified: Secondary | ICD-10-CM | POA: Diagnosis not present

## 2016-01-04 DIAGNOSIS — I129 Hypertensive chronic kidney disease with stage 1 through stage 4 chronic kidney disease, or unspecified chronic kidney disease: Secondary | ICD-10-CM | POA: Diagnosis not present

## 2016-01-04 DIAGNOSIS — E1129 Type 2 diabetes mellitus with other diabetic kidney complication: Secondary | ICD-10-CM | POA: Diagnosis not present

## 2016-01-04 DIAGNOSIS — R7989 Other specified abnormal findings of blood chemistry: Secondary | ICD-10-CM | POA: Diagnosis not present

## 2016-01-17 ENCOUNTER — Encounter (HOSPITAL_COMMUNITY): Payer: Medicare Other

## 2016-01-18 ENCOUNTER — Other Ambulatory Visit (HOSPITAL_COMMUNITY): Payer: Self-pay | Admitting: *Deleted

## 2016-01-19 ENCOUNTER — Ambulatory Visit (HOSPITAL_COMMUNITY)
Admission: RE | Admit: 2016-01-19 | Discharge: 2016-01-19 | Disposition: A | Discharge status: Home / Self Care | Payer: Medicare Other | Source: Ambulatory Visit | Attending: Nephrology | Admitting: Nephrology

## 2016-01-19 DIAGNOSIS — D509 Iron deficiency anemia, unspecified: Secondary | ICD-10-CM | POA: Insufficient documentation

## 2016-01-19 MED ORDER — SODIUM CHLORIDE 0.9 % IV SOLN
510.0000 mg | Freq: Once | INTRAVENOUS | Status: AC
  Administered 2016-01-19: 510 mg via INTRAVENOUS
  Filled 2016-01-19: qty 17

## 2016-03-13 DIAGNOSIS — K219 Gastro-esophageal reflux disease without esophagitis: Secondary | ICD-10-CM | POA: Diagnosis not present

## 2016-03-13 DIAGNOSIS — E559 Vitamin D deficiency, unspecified: Secondary | ICD-10-CM | POA: Diagnosis not present

## 2016-03-13 DIAGNOSIS — I10 Essential (primary) hypertension: Secondary | ICD-10-CM | POA: Diagnosis not present

## 2016-03-13 DIAGNOSIS — R5383 Other fatigue: Secondary | ICD-10-CM | POA: Diagnosis not present

## 2016-03-13 DIAGNOSIS — E119 Type 2 diabetes mellitus without complications: Secondary | ICD-10-CM | POA: Diagnosis not present

## 2016-03-14 DIAGNOSIS — R5383 Other fatigue: Secondary | ICD-10-CM | POA: Diagnosis not present

## 2016-03-14 DIAGNOSIS — E119 Type 2 diabetes mellitus without complications: Secondary | ICD-10-CM | POA: Diagnosis not present

## 2016-04-04 ENCOUNTER — Encounter: Payer: Self-pay | Admitting: Podiatry

## 2016-04-04 ENCOUNTER — Ambulatory Visit (INDEPENDENT_AMBULATORY_CARE_PROVIDER_SITE_OTHER): Payer: Medicare Other | Admitting: Podiatry

## 2016-04-04 DIAGNOSIS — M79676 Pain in unspecified toe(s): Secondary | ICD-10-CM

## 2016-04-04 DIAGNOSIS — B351 Tinea unguium: Secondary | ICD-10-CM

## 2016-04-04 NOTE — Progress Notes (Signed)
Patient ID: Angelica Beck, female   DOB: August 15, 1951, 65 y.o.   MRN: [id number] Complaint:  Visit Type: Patient returns to my office for continued preventative foot care services. Complaint: Patient states" my nails have grown long and thick and become painful to walk and wear shoes" Patient has been diagnosed with DM with no foot complications. The patient presents for preventative foot care services. No changes to ROS  Podiatric Exam: Vascular: dorsalis pedis and posterior tibial pulses are palpable bilateral. Capillary return is immediate. Temperature gradient is WNL. Skin turgor WNL  Sensorium: Normal Semmes Weinstein monofilament test. Normal tactile sensation bilaterally. Nail Exam: Pt has thick disfigured discolored nails with subungual debris noted bilateral entire nail hallux through fifth toenails Ulcer Exam: There is no evidence of ulcer or pre-ulcerative changes or infection. Orthopedic Exam: Muscle tone and strength are WNL. No limitations in general ROM. No crepitus or effusions noted. Foot type and digits show no abnormalities. Bony prominences are unremarkable. Skin: No Porokeratosis. No infection or ulcers  Diagnosis:  Onychomycosis, , Pain in right toe, pain in left toes  Treatment & Plan Procedures and Treatment: Consent by patient was obtained for treatment procedures. The patient understood the discussion of treatment and procedures well. All questions were answered thoroughly reviewed. Debridement of mycotic and hypertrophic toenails, 1 through 5 bilateral and clearing of subungual debris. No ulceration, no infection noted.  Return Visit-Office Procedure: Patient instructed to return to the office for a follow up visit 3 months for continued evaluation and treatment.   Gardiner Barefoot DPM

## 2016-05-19 ENCOUNTER — Encounter: Payer: Self-pay | Admitting: Pulmonary Disease

## 2016-05-19 ENCOUNTER — Ambulatory Visit (INDEPENDENT_AMBULATORY_CARE_PROVIDER_SITE_OTHER): Payer: Medicare Other | Admitting: Pulmonary Disease

## 2016-05-19 VITALS — BP 126/84 | HR 80 | Wt 215.0 lb

## 2016-05-19 DIAGNOSIS — J9611 Chronic respiratory failure with hypoxia: Secondary | ICD-10-CM | POA: Diagnosis not present

## 2016-05-19 DIAGNOSIS — E662 Morbid (severe) obesity with alveolar hypoventilation: Secondary | ICD-10-CM

## 2016-05-19 NOTE — Patient Instructions (Addendum)
Will arrange for overnight oxygen test  Follow up in 1 year

## 2016-05-19 NOTE — Progress Notes (Signed)
Current Outpatient Prescriptions on File Prior to Visit  Medication Sig  . albuterol (PROVENTIL HFA;VENTOLIN HFA) 108 (90 BASE) MCG/ACT inhaler Inhale 2 puffs into the lungs every 6 (six) hours as needed for wheezing or shortness of breath.  . Cholecalciferol (VITAMIN D) 2000 UNITS tablet Take 2,000 Units by mouth daily.  . clopidogrel (PLAVIX) 75 MG tablet Take 1 tablet (75 mg total) by mouth daily.  Marland Kitchen gabapentin (NEURONTIN) 100 MG capsule Take 1 capsule (100 mg total) by mouth at bedtime.  . insulin glargine (LANTUS) 100 UNIT/ML injection 10 units at breakfast and 20 units every evening  . metoprolol succinate (TOPROL-XL) 25 MG 24 hr tablet Take 3 tablets (75 mg total) by mouth daily.  . Multiple Vitamin (MULTIVITAMIN WITH MINERALS) TABS tablet Take 1 tablet by mouth daily.  . niacin (NIASPAN) 1000 MG CR tablet Take 1 tablet (1,000 mg total) by mouth at bedtime.  . nitroGLYCERIN (NITROSTAT) 0.4 MG SL tablet Place 1 tablet (0.4 mg total) under the tongue every 5 (five) minutes as needed for chest pain.  Marland Kitchen propylthiouracil (PTU) 50 MG tablet Take 1 tablet (50 mg total) by mouth 2 (two) times daily.  . ranitidine (ZANTAC) 150 MG tablet Take 150 mg by mouth 2 (two) times daily.   . rosuvastatin (CRESTOR) 40 MG tablet Take 1 tablet (40 mg total) by mouth at bedtime.   No current facility-administered medications on file prior to visit.     Chief Complaint  Patient presents with  . Obesity Hypoventilation Syndrome    Sleep tests PSG 07/20/13 >> AHI 0.6, SpO2 low 88%, Spent 6.6 min with SpO2 < 90%, PLMI 58.5, decreased sleep time ONO with RA 07/30/13 >> Test time 9 hrs 47 min. Baseline SpO2 94%, low SpO2 68%. Spent 1 hr 6 min with SpO2 < 89%. ONO with RA 05/06/15 >> Test time 7 hrs 26 min.  Mean SpO2 77%, low SpO2 59%.  Spent 5 hrs 48 min with SpO2 < 88%.  Past medical history HTN, CVA, DM, HLD, Hypothyroidism  Past surgical history, Family history, Social history, Allergies  reviewed.  Vital signs BP 126/84 (BP Location: Left Arm, Cuff Size: Normal)   Pulse 80   Wt 215 lb (97.5 kg)   SpO2 97%   BMI 34.70 kg/m   History of Present Illness: Angelica Beck is a 65 y.o. female with obesity hypoventilation syndrome using 2 liters oxygen.  She is with her grand-daughter.  Spoke to her daughter on phone.    She is using oxygen at night.  Sleeping well.  Having trouble with urine infections >> has appt with urology.   Physical Exam: BP 126/84 (BP Location: Left Arm, Cuff Size: Normal)   Pulse 80   Wt 215 lb (97.5 kg)   SpO2 97%   BMI 34.70 kg/m   General - No distress, hirsutism appearance ENT - No sinus tenderness, no oral exudate, no LAN Cardiac - s1s2 regular, no murmur Chest - No wheeze/rales/dullness Back - No focal tenderness Abd - Soft, non-tender Ext - No edema Neuro - Normal strength Skin - No rashes Psych - normal mood, and behavior   Assessment/Plan:  Obesity hypoventilation syndrome. - continue 2 liters oxygen - will arrange for ONO on room air to recertify for home oxygen therapy - discussed importance of weight loss   Patient Instructions  Will arrange for overnight oxygen test  Follow up in 1 year   Chesley Mires, MD [facility] Pager:  [phone] 05/19/2016, 4:26 PM

## 2016-05-25 DIAGNOSIS — I639 Cerebral infarction, unspecified: Secondary | ICD-10-CM | POA: Diagnosis not present

## 2016-05-25 DIAGNOSIS — I129 Hypertensive chronic kidney disease with stage 1 through stage 4 chronic kidney disease, or unspecified chronic kidney disease: Secondary | ICD-10-CM | POA: Diagnosis not present

## 2016-05-25 DIAGNOSIS — N183 Chronic kidney disease, stage 3 (moderate): Secondary | ICD-10-CM | POA: Diagnosis not present

## 2016-05-25 DIAGNOSIS — D649 Anemia, unspecified: Secondary | ICD-10-CM | POA: Diagnosis not present

## 2016-05-25 DIAGNOSIS — E1129 Type 2 diabetes mellitus with other diabetic kidney complication: Secondary | ICD-10-CM | POA: Diagnosis not present

## 2016-05-26 DIAGNOSIS — J449 Chronic obstructive pulmonary disease, unspecified: Secondary | ICD-10-CM | POA: Diagnosis not present

## 2016-06-06 DIAGNOSIS — N3020 Other chronic cystitis without hematuria: Secondary | ICD-10-CM | POA: Diagnosis not present

## 2016-06-06 DIAGNOSIS — N1339 Other hydronephrosis: Secondary | ICD-10-CM | POA: Diagnosis not present

## 2016-06-06 DIAGNOSIS — N3942 Incontinence without sensory awareness: Secondary | ICD-10-CM | POA: Diagnosis not present

## 2016-06-07 ENCOUNTER — Telehealth: Payer: Self-pay | Admitting: Pulmonary Disease

## 2016-06-07 NOTE — Telephone Encounter (Signed)
LM x 1 

## 2016-06-07 NOTE — Telephone Encounter (Signed)
ONO with RA 05/27/16 >> test time 11 hrs 39 min.  Baseline SpO2 95.7%, low SpO2 80%.  Spent 6 min 20 sec with SpO2 < 88%.   Will have my nurse inform pt that ONO shows oxygen level still low at night.  She should continue using 2 liters oxygen with sleep.

## 2016-06-12 NOTE — Telephone Encounter (Signed)
Spoke with pt, aware of results/recs.  Nothing further needed.  

## 2016-06-20 ENCOUNTER — Inpatient Hospital Stay (HOSPITAL_COMMUNITY)
Admission: EM | Admit: 2016-06-20 | Discharge: 2016-07-18 | DRG: 003 | Disposition: A | Discharge status: Other Acute Inpatient Hospital | Payer: Medicare Other | Attending: Pulmonary Disease | Admitting: Pulmonary Disease

## 2016-06-20 ENCOUNTER — Inpatient Hospital Stay (HOSPITAL_COMMUNITY): Payer: Medicare Other

## 2016-06-20 ENCOUNTER — Emergency Department (HOSPITAL_COMMUNITY): Payer: Medicare Other

## 2016-06-20 ENCOUNTER — Encounter (HOSPITAL_COMMUNITY): Payer: Self-pay | Admitting: Emergency Medicine

## 2016-06-20 DIAGNOSIS — R6521 Severe sepsis with septic shock: Secondary | ICD-10-CM | POA: Diagnosis present

## 2016-06-20 DIAGNOSIS — F0390 Unspecified dementia without behavioral disturbance: Secondary | ICD-10-CM | POA: Diagnosis present

## 2016-06-20 DIAGNOSIS — B9620 Unspecified Escherichia coli [E. coli] as the cause of diseases classified elsewhere: Secondary | ICD-10-CM | POA: Diagnosis present

## 2016-06-20 DIAGNOSIS — Z794 Long term (current) use of insulin: Secondary | ICD-10-CM

## 2016-06-20 DIAGNOSIS — R0689 Other abnormalities of breathing: Secondary | ICD-10-CM | POA: Diagnosis not present

## 2016-06-20 DIAGNOSIS — J9811 Atelectasis: Secondary | ICD-10-CM | POA: Diagnosis not present

## 2016-06-20 DIAGNOSIS — X58XXXA Exposure to other specified factors, initial encounter: Secondary | ICD-10-CM | POA: Diagnosis present

## 2016-06-20 DIAGNOSIS — L8990 Pressure ulcer of unspecified site, unspecified stage: Secondary | ICD-10-CM | POA: Insufficient documentation

## 2016-06-20 DIAGNOSIS — Z880 Allergy status to penicillin: Secondary | ICD-10-CM

## 2016-06-20 DIAGNOSIS — Z792 Long term (current) use of antibiotics: Secondary | ICD-10-CM

## 2016-06-20 DIAGNOSIS — E785 Hyperlipidemia, unspecified: Secondary | ICD-10-CM | POA: Diagnosis present

## 2016-06-20 DIAGNOSIS — J9690 Respiratory failure, unspecified, unspecified whether with hypoxia or hypercapnia: Secondary | ICD-10-CM | POA: Diagnosis not present

## 2016-06-20 DIAGNOSIS — Z7401 Bed confinement status: Secondary | ICD-10-CM | POA: Diagnosis not present

## 2016-06-20 DIAGNOSIS — R7309 Other abnormal glucose: Secondary | ICD-10-CM | POA: Diagnosis not present

## 2016-06-20 DIAGNOSIS — E1121 Type 2 diabetes mellitus with diabetic nephropathy: Secondary | ICD-10-CM | POA: Diagnosis not present

## 2016-06-20 DIAGNOSIS — R609 Edema, unspecified: Secondary | ICD-10-CM | POA: Diagnosis not present

## 2016-06-20 DIAGNOSIS — Z515 Encounter for palliative care: Secondary | ICD-10-CM | POA: Diagnosis present

## 2016-06-20 DIAGNOSIS — I129 Hypertensive chronic kidney disease with stage 1 through stage 4 chronic kidney disease, or unspecified chronic kidney disease: Secondary | ICD-10-CM | POA: Diagnosis present

## 2016-06-20 DIAGNOSIS — N186 End stage renal disease: Secondary | ICD-10-CM | POA: Diagnosis not present

## 2016-06-20 DIAGNOSIS — K56609 Unspecified intestinal obstruction, unspecified as to partial versus complete obstruction: Secondary | ICD-10-CM

## 2016-06-20 DIAGNOSIS — Z992 Dependence on renal dialysis: Secondary | ICD-10-CM

## 2016-06-20 DIAGNOSIS — E049 Nontoxic goiter, unspecified: Secondary | ICD-10-CM | POA: Diagnosis not present

## 2016-06-20 DIAGNOSIS — Z9911 Dependence on respirator [ventilator] status: Secondary | ICD-10-CM | POA: Diagnosis not present

## 2016-06-20 DIAGNOSIS — N184 Chronic kidney disease, stage 4 (severe): Secondary | ICD-10-CM | POA: Diagnosis not present

## 2016-06-20 DIAGNOSIS — E872 Acidosis, unspecified: Secondary | ICD-10-CM | POA: Diagnosis present

## 2016-06-20 DIAGNOSIS — E1122 Type 2 diabetes mellitus with diabetic chronic kidney disease: Secondary | ICD-10-CM | POA: Diagnosis present

## 2016-06-20 DIAGNOSIS — J9600 Acute respiratory failure, unspecified whether with hypoxia or hypercapnia: Secondary | ICD-10-CM

## 2016-06-20 DIAGNOSIS — R0902 Hypoxemia: Secondary | ICD-10-CM | POA: Diagnosis not present

## 2016-06-20 DIAGNOSIS — D631 Anemia in chronic kidney disease: Secondary | ICD-10-CM | POA: Diagnosis not present

## 2016-06-20 DIAGNOSIS — Z452 Encounter for adjustment and management of vascular access device: Secondary | ICD-10-CM | POA: Diagnosis not present

## 2016-06-20 DIAGNOSIS — N170 Acute kidney failure with tubular necrosis: Secondary | ICD-10-CM | POA: Diagnosis not present

## 2016-06-20 DIAGNOSIS — D696 Thrombocytopenia, unspecified: Secondary | ICD-10-CM | POA: Diagnosis present

## 2016-06-20 DIAGNOSIS — R404 Transient alteration of awareness: Secondary | ICD-10-CM | POA: Diagnosis not present

## 2016-06-20 DIAGNOSIS — E662 Morbid (severe) obesity with alveolar hypoventilation: Secondary | ICD-10-CM | POA: Diagnosis present

## 2016-06-20 DIAGNOSIS — Z87891 Personal history of nicotine dependence: Secondary | ICD-10-CM

## 2016-06-20 DIAGNOSIS — Z978 Presence of other specified devices: Secondary | ICD-10-CM

## 2016-06-20 DIAGNOSIS — A419 Sepsis, unspecified organism: Principal | ICD-10-CM | POA: Diagnosis present

## 2016-06-20 DIAGNOSIS — R0602 Shortness of breath: Secondary | ICD-10-CM | POA: Diagnosis not present

## 2016-06-20 DIAGNOSIS — E0500 Thyrotoxicosis with diffuse goiter without thyrotoxic crisis or storm: Secondary | ICD-10-CM | POA: Diagnosis present

## 2016-06-20 DIAGNOSIS — R740 Nonspecific elevation of levels of transaminase and lactic acid dehydrogenase [LDH]: Secondary | ICD-10-CM | POA: Diagnosis present

## 2016-06-20 DIAGNOSIS — N179 Acute kidney failure, unspecified: Secondary | ICD-10-CM | POA: Diagnosis not present

## 2016-06-20 DIAGNOSIS — E11641 Type 2 diabetes mellitus with hypoglycemia with coma: Secondary | ICD-10-CM | POA: Diagnosis present

## 2016-06-20 DIAGNOSIS — Z6838 Body mass index (BMI) 38.0-38.9, adult: Secondary | ICD-10-CM

## 2016-06-20 DIAGNOSIS — Z4659 Encounter for fitting and adjustment of other gastrointestinal appliance and device: Secondary | ICD-10-CM

## 2016-06-20 DIAGNOSIS — J189 Pneumonia, unspecified organism: Secondary | ICD-10-CM | POA: Diagnosis present

## 2016-06-20 DIAGNOSIS — J984 Other disorders of lung: Secondary | ICD-10-CM | POA: Diagnosis not present

## 2016-06-20 DIAGNOSIS — G9341 Metabolic encephalopathy: Secondary | ICD-10-CM | POA: Diagnosis present

## 2016-06-20 DIAGNOSIS — E875 Hyperkalemia: Secondary | ICD-10-CM | POA: Diagnosis present

## 2016-06-20 DIAGNOSIS — R1032 Left lower quadrant pain: Secondary | ICD-10-CM

## 2016-06-20 DIAGNOSIS — B961 Klebsiella pneumoniae [K. pneumoniae] as the cause of diseases classified elsewhere: Secondary | ICD-10-CM | POA: Diagnosis present

## 2016-06-20 DIAGNOSIS — N183 Chronic kidney disease, stage 3 (moderate): Secondary | ICD-10-CM | POA: Diagnosis present

## 2016-06-20 DIAGNOSIS — R4182 Altered mental status, unspecified: Secondary | ICD-10-CM | POA: Diagnosis not present

## 2016-06-20 DIAGNOSIS — Z4901 Encounter for fitting and adjustment of extracorporeal dialysis catheter: Secondary | ICD-10-CM | POA: Diagnosis not present

## 2016-06-20 DIAGNOSIS — S31000A Unspecified open wound of lower back and pelvis without penetration into retroperitoneum, initial encounter: Secondary | ICD-10-CM | POA: Diagnosis present

## 2016-06-20 DIAGNOSIS — S31829A Unspecified open wound of left buttock, initial encounter: Secondary | ICD-10-CM | POA: Diagnosis present

## 2016-06-20 DIAGNOSIS — J982 Interstitial emphysema: Secondary | ICD-10-CM | POA: Diagnosis not present

## 2016-06-20 DIAGNOSIS — J9621 Acute and chronic respiratory failure with hypoxia: Secondary | ICD-10-CM | POA: Diagnosis present

## 2016-06-20 DIAGNOSIS — E161 Other hypoglycemia: Secondary | ICD-10-CM | POA: Diagnosis not present

## 2016-06-20 DIAGNOSIS — R918 Other nonspecific abnormal finding of lung field: Secondary | ICD-10-CM | POA: Diagnosis not present

## 2016-06-20 DIAGNOSIS — Z930 Tracheostomy status: Secondary | ICD-10-CM

## 2016-06-20 DIAGNOSIS — R269 Unspecified abnormalities of gait and mobility: Secondary | ICD-10-CM | POA: Diagnosis not present

## 2016-06-20 DIAGNOSIS — J90 Pleural effusion, not elsewhere classified: Secondary | ICD-10-CM | POA: Diagnosis not present

## 2016-06-20 DIAGNOSIS — Z931 Gastrostomy status: Secondary | ICD-10-CM | POA: Diagnosis not present

## 2016-06-20 DIAGNOSIS — N189 Chronic kidney disease, unspecified: Secondary | ICD-10-CM | POA: Diagnosis not present

## 2016-06-20 DIAGNOSIS — G9340 Encephalopathy, unspecified: Secondary | ICD-10-CM | POA: Diagnosis not present

## 2016-06-20 DIAGNOSIS — E1129 Type 2 diabetes mellitus with other diabetic kidney complication: Secondary | ICD-10-CM | POA: Diagnosis not present

## 2016-06-20 DIAGNOSIS — R109 Unspecified abdominal pain: Secondary | ICD-10-CM

## 2016-06-20 DIAGNOSIS — E8770 Fluid overload, unspecified: Secondary | ICD-10-CM | POA: Diagnosis not present

## 2016-06-20 DIAGNOSIS — E11649 Type 2 diabetes mellitus with hypoglycemia without coma: Secondary | ICD-10-CM | POA: Diagnosis present

## 2016-06-20 DIAGNOSIS — Z833 Family history of diabetes mellitus: Secondary | ICD-10-CM

## 2016-06-20 DIAGNOSIS — A0472 Enterocolitis due to Clostridium difficile, not specified as recurrent: Secondary | ICD-10-CM | POA: Diagnosis present

## 2016-06-20 DIAGNOSIS — R600 Localized edema: Secondary | ICD-10-CM | POA: Diagnosis not present

## 2016-06-20 DIAGNOSIS — K5660 Unspecified intestinal obstruction: Secondary | ICD-10-CM | POA: Diagnosis not present

## 2016-06-20 DIAGNOSIS — N390 Urinary tract infection, site not specified: Secondary | ICD-10-CM | POA: Diagnosis present

## 2016-06-20 DIAGNOSIS — D259 Leiomyoma of uterus, unspecified: Secondary | ICD-10-CM | POA: Diagnosis not present

## 2016-06-20 DIAGNOSIS — E1165 Type 2 diabetes mellitus with hyperglycemia: Secondary | ICD-10-CM | POA: Diagnosis present

## 2016-06-20 DIAGNOSIS — M1990 Unspecified osteoarthritis, unspecified site: Secondary | ICD-10-CM | POA: Diagnosis not present

## 2016-06-20 DIAGNOSIS — J9601 Acute respiratory failure with hypoxia: Secondary | ICD-10-CM | POA: Diagnosis not present

## 2016-06-20 DIAGNOSIS — R0600 Dyspnea, unspecified: Secondary | ICD-10-CM | POA: Diagnosis not present

## 2016-06-20 DIAGNOSIS — E0590 Thyrotoxicosis, unspecified without thyrotoxic crisis or storm: Secondary | ICD-10-CM | POA: Diagnosis not present

## 2016-06-20 DIAGNOSIS — Z4682 Encounter for fitting and adjustment of non-vascular catheter: Secondary | ICD-10-CM | POA: Diagnosis not present

## 2016-06-20 DIAGNOSIS — M726 Necrotizing fasciitis: Secondary | ICD-10-CM

## 2016-06-20 DIAGNOSIS — R32 Unspecified urinary incontinence: Secondary | ICD-10-CM | POA: Diagnosis present

## 2016-06-20 DIAGNOSIS — K5669 Other intestinal obstruction: Secondary | ICD-10-CM | POA: Diagnosis not present

## 2016-06-20 DIAGNOSIS — IMO0001 Reserved for inherently not codable concepts without codable children: Secondary | ICD-10-CM

## 2016-06-20 DIAGNOSIS — Z7902 Long term (current) use of antithrombotics/antiplatelets: Secondary | ICD-10-CM

## 2016-06-20 DIAGNOSIS — Z91011 Allergy to milk products: Secondary | ICD-10-CM

## 2016-06-20 DIAGNOSIS — R34 Anuria and oliguria: Secondary | ICD-10-CM | POA: Diagnosis present

## 2016-06-20 DIAGNOSIS — Z0389 Encounter for observation for other suspected diseases and conditions ruled out: Secondary | ICD-10-CM | POA: Diagnosis not present

## 2016-06-20 DIAGNOSIS — Z8673 Personal history of transient ischemic attack (TIA), and cerebral infarction without residual deficits: Secondary | ICD-10-CM

## 2016-06-20 DIAGNOSIS — R7401 Elevation of levels of liver transaminase levels: Secondary | ICD-10-CM

## 2016-06-20 DIAGNOSIS — B958 Unspecified staphylococcus as the cause of diseases classified elsewhere: Secondary | ICD-10-CM | POA: Diagnosis present

## 2016-06-20 DIAGNOSIS — N2581 Secondary hyperparathyroidism of renal origin: Secondary | ICD-10-CM | POA: Diagnosis not present

## 2016-06-20 DIAGNOSIS — T797XXA Traumatic subcutaneous emphysema, initial encounter: Secondary | ICD-10-CM

## 2016-06-20 DIAGNOSIS — J439 Emphysema, unspecified: Secondary | ICD-10-CM | POA: Diagnosis not present

## 2016-06-20 LAB — COMPREHENSIVE METABOLIC PANEL
ALT: 12 U/L — AB (ref 14–54)
AST: 30 U/L (ref 15–41)
Albumin: 2.9 g/dL — ABNORMAL LOW (ref 3.5–5.0)
Alkaline Phosphatase: 59 U/L (ref 38–126)
BILIRUBIN TOTAL: 0.9 mg/dL (ref 0.3–1.2)
BUN: 95 mg/dL — AB (ref 6–20)
CALCIUM: 8.8 mg/dL — AB (ref 8.9–10.3)
CHLORIDE: 95 mmol/L — AB (ref 101–111)
CO2: <7 mmol/L — ABNORMAL LOW (ref 22–32)
Creatinine, Ser: 6.51 mg/dL — ABNORMAL HIGH (ref 0.44–1.00)
GFR, EST AFRICAN AMERICAN: 7 mL/min — AB (ref >60)
GFR, EST NON AFRICAN AMERICAN: 6 mL/min — AB (ref >60)
Glucose, Bld: 136 mg/dL — ABNORMAL HIGH (ref 65–99)
Potassium: 7.4 mmol/L (ref 3.5–5.1)
Sodium: 138 mmol/L (ref 135–145)
Total Protein: 6.7 g/dL (ref 6.5–8.1)

## 2016-06-20 LAB — RAPID URINE DRUG SCREEN, HOSP PERFORMED
Amphetamines: NOT DETECTED
BENZODIAZEPINES: NOT DETECTED
Barbiturates: NOT DETECTED
COCAINE: NOT DETECTED
Opiates: NOT DETECTED
Tetrahydrocannabinol: NOT DETECTED

## 2016-06-20 LAB — I-STAT VENOUS BLOOD GAS, ED
Acid-base deficit: 21 mmol/L — ABNORMAL HIGH (ref 0.0–2.0)
Acid-base deficit: 21 mmol/L — ABNORMAL HIGH (ref 0.0–2.0)
Bicarbonate: 6.4 mmol/L — ABNORMAL LOW (ref 20.0–28.0)
Bicarbonate: 6.7 mmol/L — ABNORMAL LOW (ref 20.0–28.0)
O2 SAT: 84 %
O2 Saturation: 74 %
PCO2 VEN: 20.9 mmHg — AB (ref 44.0–60.0)
PCO2 VEN: 21.8 mmHg — AB (ref 44.0–60.0)
PH VEN: 7.096 — AB (ref 7.250–7.430)
PH VEN: 7.101 — AB (ref 7.250–7.430)
PO2 VEN: 52 mmHg — AB (ref 32.0–45.0)
Patient temperature: 99.9
TCO2: 7 mmol/L (ref 0–100)
TCO2: 7 mmol/L (ref 0–100)
pO2, Ven: 67 mmHg — ABNORMAL HIGH (ref 32.0–45.0)

## 2016-06-20 LAB — CBC WITH DIFFERENTIAL/PLATELET
BASOS PCT: 0 %
Basophils Absolute: 0 10*3/uL (ref 0.0–0.1)
Eosinophils Absolute: 0 10*3/uL (ref 0.0–0.7)
Eosinophils Relative: 0 %
HCT: 37.7 % (ref 36.0–46.0)
Hemoglobin: 11.2 g/dL — ABNORMAL LOW (ref 12.0–15.0)
LYMPHS PCT: 15 %
Lymphs Abs: 1.8 10*3/uL (ref 0.7–4.0)
MCH: 28.1 pg (ref 26.0–34.0)
MCHC: 29.7 g/dL — ABNORMAL LOW (ref 30.0–36.0)
MCV: 94.5 fL (ref 78.0–100.0)
MONOS PCT: 12 %
Monocytes Absolute: 1.5 10*3/uL — ABNORMAL HIGH (ref 0.1–1.0)
NEUTROS ABS: 9 10*3/uL — AB (ref 1.7–7.7)
Neutrophils Relative %: 73 %
PLATELETS: 313 10*3/uL (ref 150–400)
RBC: 3.99 MIL/uL (ref 3.87–5.11)
RDW: 15.5 % (ref 11.5–15.5)
WBC MORPHOLOGY: INCREASED
WBC: 12.3 10*3/uL — ABNORMAL HIGH (ref 4.0–10.5)

## 2016-06-20 LAB — I-STAT ARTERIAL BLOOD GAS, ED
ACID-BASE DEFICIT: 22 mmol/L — AB (ref 0.0–2.0)
Acid-base deficit: 22 mmol/L — ABNORMAL HIGH (ref 0.0–2.0)
BICARBONATE: 6.4 mmol/L — AB (ref 20.0–28.0)
Bicarbonate: 6.5 mmol/L — ABNORMAL LOW (ref 20.0–28.0)
O2 Saturation: 91 %
O2 Saturation: 99 %
PCO2 ART: 22.4 mmHg — AB (ref 32.0–48.0)
PCO2 ART: 22.6 mmHg — AB (ref 32.0–48.0)
PO2 ART: 215 mmHg — AB (ref 83.0–108.0)
PO2 ART: 85 mmHg (ref 83.0–108.0)
Patient temperature: 99.9
Patient temperature: 99.9
TCO2: 7 mmol/L (ref 0–100)
TCO2: 7 mmol/L (ref 0–100)
pH, Arterial: 7.065 — CL (ref 7.350–7.450)
pH, Arterial: 7.076 — CL (ref 7.350–7.450)

## 2016-06-20 LAB — URINALYSIS, ROUTINE W REFLEX MICROSCOPIC
GLUCOSE, UA: NEGATIVE mg/dL
Ketones, ur: 15 mg/dL — AB
Leukocytes, UA: NEGATIVE
Nitrite: NEGATIVE
PROTEIN: 100 mg/dL — AB
SPECIFIC GRAVITY, URINE: 1.022 (ref 1.005–1.030)
pH: 5 (ref 5.0–8.0)

## 2016-06-20 LAB — CBG MONITORING, ED
GLUCOSE-CAPILLARY: 157 mg/dL — AB (ref 65–99)
Glucose-Capillary: 120 mg/dL — ABNORMAL HIGH (ref 65–99)

## 2016-06-20 LAB — SALICYLATE LEVEL: Salicylate Lvl: <4 mg/dL (ref 2.8–30.0)

## 2016-06-20 LAB — URINE MICROSCOPIC-ADD ON

## 2016-06-20 LAB — T4, FREE: Free T4: 1.33 ng/dL — ABNORMAL HIGH (ref 0.61–1.12)

## 2016-06-20 LAB — GRAM STAIN

## 2016-06-20 LAB — TSH: TSH: 0.331 u[IU]/mL — ABNORMAL LOW (ref 0.350–4.500)

## 2016-06-20 LAB — I-STAT TROPONIN, ED: Troponin i, poc: 0.03 ng/mL (ref 0.00–0.08)

## 2016-06-20 LAB — CK: Total CK: 34 U/L — ABNORMAL LOW (ref 38–234)

## 2016-06-20 LAB — ACETAMINOPHEN LEVEL

## 2016-06-20 LAB — AMMONIA: AMMONIA: 344 umol/L — AB (ref 9–35)

## 2016-06-20 LAB — I-STAT CG4 LACTIC ACID, ED: LACTIC ACID, VENOUS: 13.88 mmol/L — AB (ref 0.5–1.9)

## 2016-06-20 MED ORDER — SODIUM CHLORIDE 0.9 % IV SOLN
3.0000 g | Freq: Once | INTRAVENOUS | Status: AC
  Administered 2016-06-20: 3 g via INTRAVENOUS
  Filled 2016-06-20: qty 30

## 2016-06-20 MED ORDER — SODIUM BICARBONATE 8.4 % IV SOLN
INTRAVENOUS | Status: AC
  Filled 2016-06-20: qty 50

## 2016-06-20 MED ORDER — KETAMINE HCL-SODIUM CHLORIDE 100-0.9 MG/10ML-% IV SOSY
2.0000 mg/kg | PREFILLED_SYRINGE | Freq: Once | INTRAVENOUS | Status: DC
  Filled 2016-06-20 (×2): qty 20

## 2016-06-20 MED ORDER — FENTANYL CITRATE (PF) 100 MCG/2ML IJ SOLN: 100.0000 ug | INTRAMUSCULAR | Status: DC | PRN

## 2016-06-20 MED ORDER — PHENYLEPHRINE HCL 10 MG/ML IJ SOLN
INTRAMUSCULAR | Status: AC | PRN
  Administered 2016-06-20: 0.2 mg via INTRAVENOUS

## 2016-06-20 MED ORDER — VANCOMYCIN HCL IN DEXTROSE 1-5 GM/200ML-% IV SOLN: 1000.0000 mg | Freq: Once | INTRAVENOUS | Status: DC

## 2016-06-20 MED ORDER — INSULIN ASPART 100 UNIT/ML ~~LOC~~ SOLN
2.0000 [IU] | SUBCUTANEOUS | Status: DC
  Administered 2016-06-21: 6 [IU] via SUBCUTANEOUS
  Administered 2016-06-21: 2 [IU] via SUBCUTANEOUS

## 2016-06-20 MED ORDER — MIDAZOLAM HCL 2 MG/2ML IJ SOLN: 2.0000 mg | INTRAMUSCULAR | Status: DC | PRN

## 2016-06-20 MED ORDER — FENTANYL CITRATE (PF) 100 MCG/2ML IJ SOLN: 150.0000 ug | Freq: Once | INTRAMUSCULAR | Status: DC

## 2016-06-20 MED ORDER — LEVOFLOXACIN IN D5W 500 MG/100ML IV SOLN
500.0000 mg | INTRAVENOUS | Status: DC
  Filled 2016-06-20: qty 100

## 2016-06-20 MED ORDER — SODIUM BICARBONATE 8.4 % IV SOLN
INTRAVENOUS | Status: DC
  Administered 2016-06-20: via INTRAVENOUS
  Filled 2016-06-20 (×4): qty 150

## 2016-06-20 MED ORDER — NOREPINEPHRINE BITARTRATE 1 MG/ML IV SOLN
2.0000 ug/min | INTRAVENOUS | Status: DC
  Administered 2016-06-21: 30 ug/min via INTRAVENOUS
  Administered 2016-06-21: 25 ug/min via INTRAVENOUS
  Administered 2016-06-21: 10 ug/min via INTRAVENOUS
  Administered 2016-06-21: 25 ug/min via INTRAVENOUS
  Filled 2016-06-20 (×5): qty 4

## 2016-06-20 MED ORDER — MIDAZOLAM HCL 2 MG/2ML IJ SOLN
2.0000 mg | INTRAMUSCULAR | Status: DC | PRN
  Filled 2016-06-20: qty 2

## 2016-06-20 MED ORDER — FAMOTIDINE IN NACL 20-0.9 MG/50ML-% IV SOLN
20.0000 mg | Freq: Two times a day (BID) | INTRAVENOUS | Status: DC
  Filled 2016-06-20 (×2): qty 50

## 2016-06-20 MED ORDER — FENTANYL CITRATE (PF) 100 MCG/2ML IJ SOLN
INTRAMUSCULAR | Status: AC | PRN
  Administered 2016-06-20: 150 ug via INTRAVENOUS

## 2016-06-20 MED ORDER — HEPARIN SODIUM (PORCINE) 5000 UNIT/ML IJ SOLN
5000.0000 [IU] | Freq: Three times a day (TID) | INTRAMUSCULAR | Status: DC
  Administered 2016-06-21 – 2016-06-29 (×23): 5000 [IU] via SUBCUTANEOUS
  Filled 2016-06-20 (×28): qty 1

## 2016-06-20 MED ORDER — VANCOMYCIN HCL 10 G IV SOLR
1750.0000 mg | Freq: Once | INTRAVENOUS | Status: AC
  Administered 2016-06-20: 1750 mg via INTRAVENOUS
  Filled 2016-06-20 (×2): qty 1750

## 2016-06-20 MED ORDER — LEVOFLOXACIN IN D5W 750 MG/150ML IV SOLN
750.0000 mg | Freq: Once | INTRAVENOUS | Status: AC
  Administered 2016-06-20: 750 mg via INTRAVENOUS
  Filled 2016-06-20: qty 150

## 2016-06-20 MED ORDER — KETAMINE HCL-SODIUM CHLORIDE 100-0.9 MG/10ML-% IV SOSY
PREFILLED_SYRINGE | INTRAVENOUS | Status: AC
  Filled 2016-06-20: qty 10

## 2016-06-20 MED ORDER — SODIUM CHLORIDE 0.9 % IV SOLN
250.0000 mL | INTRAVENOUS | Status: DC | PRN
  Administered 2016-07-11: 250 mL via INTRAVENOUS

## 2016-06-20 MED ORDER — DEXTROSE 5 % IV SOLN
2.0000 g | Freq: Once | INTRAVENOUS | Status: AC
  Administered 2016-06-20: 2 g via INTRAVENOUS
  Filled 2016-06-20: qty 2

## 2016-06-20 MED ORDER — KETAMINE HCL 10 MG/ML IJ SOLN
INTRAMUSCULAR | Status: AC | PRN
  Administered 2016-06-20 (×2): 100 mg via INTRAVENOUS

## 2016-06-20 MED ORDER — SODIUM CHLORIDE 0.9 % IV BOLUS (SEPSIS)
30.0000 mL/kg | Freq: Once | INTRAVENOUS | Status: AC
  Administered 2016-06-20: 2925 mL via INTRAVENOUS

## 2016-06-20 MED ORDER — DEXTROSE 5 % IV SOLN
500.0000 mg | Freq: Three times a day (TID) | INTRAVENOUS | Status: DC
  Administered 2016-06-21: 500 mg via INTRAVENOUS
  Filled 2016-06-20 (×4): qty 0.5

## 2016-06-20 MED ORDER — FENTANYL CITRATE (PF) 100 MCG/2ML IJ SOLN
100.0000 ug | INTRAMUSCULAR | Status: DC | PRN
  Administered 2016-06-21 (×2): 100 ug via INTRAVENOUS
  Filled 2016-06-20 (×2): qty 2

## 2016-06-20 MED ORDER — VANCOMYCIN HCL IN DEXTROSE 1-5 GM/200ML-% IV SOLN
1000.0000 mg | INTRAVENOUS | Status: DC
  Administered 2016-06-22: 1000 mg via INTRAVENOUS
  Filled 2016-06-20 (×2): qty 200

## 2016-06-20 NOTE — ED Provider Notes (Signed)
[facility] DEPT Provider Note   CSN: [id number] Arrival date & time: 06/20/16  2026     History   Chief Complaint Chief Complaint  Patient presents with  . Hypoglycemia  . Hypotension  . Altered Mental Status    HPI Athaliah Baumbach is a 65 y.o. female.  HPI  Patient altered and minimally responsive to stimuli w/ GCS 10 and acutely altered and hypoglycemic, this prevented me from obtaining a complete HPI & ROS along w/ Past Medical, Family & Social Histories. The E/M Level 5 Caveat is therefore invoked. Several attempts made to obtain supplemental HPI which includes EMS who state the patient was hypoglycemic to 39 after family provided insulin around 530PM today. EMS gave d10 after glucagon 1 mg IM w/ repeat glucose 157 upon arrival in the ED. Patient remains extremely lethargic. Comes from home where patient is on chronic antibiotics for repeated UTI and confined to hospital bed at home who lives w/ daughter.   Past Medical History:  Diagnosis Date  . Arthritis   . Diabetes mellitus, type 2 ([place])   . Hyperlipidemia   . Hypertension   . Hyperthyroidism   . Obesity hypoventilation syndrome ([place]) 06/20/2013  . Stroke [facility])     Patient Active Problem List   Diagnosis Date Noted  . Abdominal pain   . Pressure injury of skin 06/24/2016  . Encounter for central line placement   . Acute respiratory failure ([place]) 06/21/2016  . Septic shock ([place]) 06/20/2016  . Chronic respiratory failure with hypoxia ([place]) 11/29/2015  . Essential hypertension   . Bacterial UTI 08/06/2015  . Sepsis ([place]) 08/06/2015  . Altered mental status   . Lactic acidosis 08/02/2015  . Acute encephalopathy 08/02/2015  . History of CVA (cerebrovascular accident) 08/02/2015  . Chronic systolic HF (heart failure) ([place]) 08/02/2015  . Cognitive impairment 08/02/2015  . AKI (acute kidney injury) ([place]) 02/08/2015  . Abnormality of gait 02/08/2015  . Hyperthyroidism 02/08/2015  . Type 2 diabetes mellitus  ([place]) 02/08/2015  . Chest pain 01/19/2014  . Obesity hypoventilation syndrome ([place]) 06/20/2013  . HYPERTHYROIDISM 06/28/2009  . DIAB W/UNS COMP TYPE II/UNS NOT STATED UNCNTRL 06/28/2009  . HYPERLIPIDEMIA-MIXED 06/28/2009  . OVERWEIGHT/OBESITY 06/28/2009  . HYPERTENSION, BENIGN 06/28/2009  . CAD, NATIVE VESSEL 06/28/2009  . DYSPNEA 06/28/2009    Past Surgical History:  Procedure Laterality Date  . VENTRAL HERNIA REPAIR      OB History    No data available       Home Medications    Prior to Admission medications   Medication Sig Start Date End Date Taking? Authorizing Provider  Cholecalciferol (VITAMIN D) 2000 UNITS tablet Take 2,000 Units by mouth daily.   Yes Historical Provider, MD  clopidogrel (PLAVIX) 75 MG tablet Take 1 tablet (75 mg total) by mouth daily. 08/24/15  Yes Daniel J Angiulli, PA-C  gabapentin (NEURONTIN) 100 MG capsule Take 1 capsule (100 mg total) by mouth at bedtime. 08/24/15  Yes Daniel J Angiulli, PA-C  insulin glargine (LANTUS) 100 UNIT/ML injection 10 units at breakfast and 20 units every evening 08/24/15  Yes Daniel J Angiulli, PA-C  metFORMIN (GLUCOPHAGE) 1000 MG tablet Take 1,000 mg by mouth 2 (two) times daily with a meal.   Yes Historical Provider, MD  metoprolol succinate (TOPROL-XL) 50 MG 24 hr tablet Take 50 mg by mouth daily. Take with or immediately following a meal.   Yes Historical Provider, MD  Multiple Vitamin (MULTIVITAMIN WITH MINERALS) TABS tablet Take 1 tablet by mouth daily.  08/24/15  Yes Daniel J Angiulli, PA-C  niacin (NIASPAN) 1000 MG CR tablet Take 1 tablet (1,000 mg total) by mouth at bedtime. 08/24/15  Yes Daniel J Angiulli, PA-C  nitroGLYCERIN (NITROSTAT) 0.4 MG SL tablet Place 1 tablet (0.4 mg total) under the tongue every 5 (five) minutes as needed for chest pain. 01/21/14  Yes Belkys A Regalado, MD  OXYGEN Inhale 2 L into the lungs at bedtime.   Yes Historical Provider, MD  propylthiouracil (PTU) 50 MG tablet Take 1 tablet (50 mg  total) by mouth 2 (two) times daily. 08/24/15  Yes Daniel J Angiulli, PA-C  ranitidine (ZANTAC) 150 MG tablet Take 150 mg by mouth 2 (two) times daily.  06/05/13  Yes Historical Provider, MD  rosuvastatin (CRESTOR) 40 MG tablet Take 1 tablet (40 mg total) by mouth at bedtime. 08/24/15  Yes Daniel J Angiulli, PA-C  traMADol (ULTRAM) 50 MG tablet Take 50 mg by mouth every 12 (twelve) hours as needed for severe pain.   Yes Historical Provider, MD  trimethoprim (TRIMPEX) 100 MG tablet Take 1 tablet by mouth daily. 06/13/16  Yes Historical Provider, MD  vitamin C (ASCORBIC ACID) 500 MG tablet Take 500 mg by mouth daily.   Yes Historical Provider, MD    Family History Family History  Problem Relation Age of Onset  . Diabetes Mother     Social History Social History  Substance Use Topics  . Smoking status: Former Smoker    Packs/day: 0.25    Years: 30.00    Types: Cigarettes    Quit date: 09/25/2002  . Smokeless tobacco: Never Used  . Alcohol use No     Allergies   Lactose intolerance (gi) and Penicillins   Review of Systems Review of Systems  Unable to perform ROS: Acuity of condition  Constitutional: Positive for activity change.  Neurological:       Decreased level of consciousness.     Physical Exam Updated Vital Signs BP (!) 108/52 (BP Location: Left Arm)   Pulse 94   Temp 99.4 F (37.4 C) (Oral)   Resp (!) 21   Ht '5\' 6"'  (1.676 m)   Wt 111.1 kg   SpO2 100%   BMI 39.53 kg/m   Physical Exam  Constitutional: She appears listless. She appears distressed Marine scientist).  Morbidly obese.  HENT:  Head: Normocephalic and atraumatic.  Right Ear: External ear normal.  Left Ear: External ear normal.  Eyes: Pupils are equal, round, and reactive to light. No scleral icterus.  Unable to assess EOM.  Neck: Neck supple. No JVD present. No thyromegaly present.  Cardiovascular: Normal rate, regular rhythm and normal heart sounds.   No murmur heard. Abdominal: Soft. Bowel sounds  are normal. She exhibits no distension. There is no tenderness. There is no guarding. No hernia.  Musculoskeletal: She exhibits no tenderness or deformity.  Neurological: She appears listless. She displays no tremor and normal reflexes. She exhibits normal muscle tone. She displays no seizure activity. GCS eye subscore is 4. GCS verbal subscore is 2. GCS motor subscore is 4.  Skin: Capillary refill takes 2 to 3 seconds. No rash noted. No pallor.  Cool & clammy.  Psychiatric:  Unable to assess mood, affect or judgement.  Nursing note and vitals reviewed.    ED Treatments / Results  Labs (all labs ordered are listed, but only abnormal results are displayed) Labs Reviewed  CULTURE, BLOOD (ROUTINE X 2) - Abnormal; Notable for the following:       Result  Value   Culture   (*)    Value: STAPHYLOCOCCUS SPECIES (COAGULASE NEGATIVE) THE SIGNIFICANCE OF ISOLATING THIS ORGANISM FROM A SINGLE SET OF BLOOD CULTURES WHEN MULTIPLE SETS ARE DRAWN IS UNCERTAIN. PLEASE NOTIFY THE MICROBIOLOGY DEPARTMENT WITHIN ONE WEEK IF SPECIATION AND SENSITIVITIES ARE REQUIRED.    All other components within normal limits  URINE CULTURE - Abnormal; Notable for the following:    Culture   (*)    Value: 5,000 COLONIES/mL ESCHERICHIA COLI 1,000 COLONIES/mL KLEBSIELLA PNEUMONIAE    Organism ID, Bacteria ESCHERICHIA COLI (*)    Organism ID, Bacteria KLEBSIELLA PNEUMONIAE (*)    All other components within normal limits  BLOOD CULTURE ID PANEL (REFLEXED) - Abnormal; Notable for the following:    Staphylococcus species DETECTED (*)    All other components within normal limits  COMPREHENSIVE METABOLIC PANEL - Abnormal; Notable for the following:    Potassium 7.4 (*)    Chloride 95 (*)    CO2 <7 (*)    Glucose, Bld 136 (*)    BUN 95 (*)    Creatinine, Ser 6.51 (*)    Calcium 8.8 (*)    Albumin 2.9 (*)    ALT 12 (*)    GFR calc non Af Amer 6 (*)    GFR calc Af Amer 7 (*)    All other components within normal  limits  CBC WITH DIFFERENTIAL/PLATELET - Abnormal; Notable for the following:    WBC 12.3 (*)    Hemoglobin 11.2 (*)    MCHC 29.7 (*)    Neutro Abs 9.0 (*)    Monocytes Absolute 1.5 (*)    All other components within normal limits  URINALYSIS, ROUTINE W REFLEX MICROSCOPIC (NOT AT [facility]) - Abnormal; Notable for the following:    APPearance CLOUDY (*)    Hgb urine dipstick SMALL (*)    Bilirubin Urine SMALL (*)    Ketones, ur 15 (*)    Protein, ur 100 (*)    All other components within normal limits  CK - Abnormal; Notable for the following:    Total CK 34 (*)    All other components within normal limits  TSH - Abnormal; Notable for the following:    TSH 0.331 (*)    All other components within normal limits  T4, FREE - Abnormal; Notable for the following:    Free T4 1.33 (*)    All other components within normal limits  AMMONIA - Abnormal; Notable for the following:    Ammonia 344 (*)    All other components within normal limits  BETA-HYDROXYBUTYRIC ACID - Abnormal; Notable for the following:    Beta-Hydroxybutyric Acid >8.00 (*)    All other components within normal limits  ACETAMINOPHEN LEVEL - Abnormal; Notable for the following:    Acetaminophen (Tylenol), Serum <10 (*)    All other components within normal limits  MAGNESIUM - Abnormal; Notable for the following:    Magnesium 2.6 (*)    All other components within normal limits  PHOSPHORUS - Abnormal; Notable for the following:    Phosphorus 8.6 (*)    All other components within normal limits  TROPONIN I - Abnormal; Notable for the following:    Troponin I 0.04 (*)    All other components within normal limits  TROPONIN I - Abnormal; Notable for the following:    Troponin I 0.06 (*)    All other components within normal limits  TROPONIN I - Abnormal; Notable for the following:  Troponin I 0.06 (*)    All other components within normal limits  LACTIC ACID, PLASMA - Abnormal; Notable for the following:    Lactic  Acid, Venous 10.0 (*)    All other components within normal limits  PROTIME-INR - Abnormal; Notable for the following:    Prothrombin Time 21.6 (*)    All other components within normal limits  CBC - Abnormal; Notable for the following:    WBC 14.6 (*)    RBC 3.41 (*)    Hemoglobin 9.3 (*)    HCT 31.4 (*)    MCHC 29.6 (*)    RDW 15.6 (*)    All other components within normal limits  BASIC METABOLIC PANEL - Abnormal; Notable for the following:    Potassium 5.7 (*)    CO2 9 (*)    Glucose, Bld 269 (*)    BUN 88 (*)    Creatinine, Ser 5.55 (*)    Calcium 7.4 (*)    GFR calc non Af Amer 7 (*)    GFR calc Af Amer 9 (*)    Anion gap 30 (*)    All other components within normal limits  MAGNESIUM - Abnormal; Notable for the following:    Magnesium 2.7 (*)    All other components within normal limits  PHOSPHORUS - Abnormal; Notable for the following:    Phosphorus 8.5 (*)    All other components within normal limits  BASIC METABOLIC PANEL - Abnormal; Notable for the following:    Potassium 5.9 (*)    CO2 7 (*)    Glucose, Bld 156 (*)    BUN 87 (*)    Creatinine, Ser 5.65 (*)    Calcium 7.9 (*)    GFR calc non Af Amer 7 (*)    GFR calc Af Amer 8 (*)    Anion gap 29 (*)    All other components within normal limits  URINE MICROSCOPIC-ADD ON - Abnormal; Notable for the following:    Squamous Epithelial / LPF 6-30 (*)    Bacteria, UA MANY (*)    All other components within normal limits  GLUCOSE, CAPILLARY - Abnormal; Notable for the following:    Glucose-Capillary 132 (*)    All other components within normal limits  CBC - Abnormal; Notable for the following:    WBC 13.8 (*)    RBC 3.33 (*)    Hemoglobin 9.1 (*)    HCT 30.9 (*)    MCHC 29.4 (*)    RDW 15.7 (*)    All other components within normal limits  BLOOD GAS, ARTERIAL - Abnormal; Notable for the following:    pH, Arterial 7.099 (*)    pO2, Arterial 203.0 (*)    Bicarbonate 5.3 (*)    Acid-base deficit 22.8 (*)      All other components within normal limits  BLOOD GAS, ARTERIAL - Abnormal; Notable for the following:    pH, Arterial 7.180 (*)    pCO2 arterial 23.5 (*)    pO2, Arterial 197 (*)    Bicarbonate 8.4 (*)    Acid-base deficit 18.4 (*)    All other components within normal limits  GLUCOSE, CAPILLARY - Abnormal; Notable for the following:    Glucose-Capillary 149 (*)    All other components within normal limits  GLUCOSE, CAPILLARY - Abnormal; Notable for the following:    Glucose-Capillary 254 (*)    All other components within normal limits  HEMOGLOBIN A1C - Abnormal; Notable for the following:  Hgb A1c MFr Bld 5.8 (*)    All other components within normal limits  LACTIC ACID, PLASMA - Abnormal; Notable for the following:    Lactic Acid, Venous 11.3 (*)    All other components within normal limits  LACTIC ACID, PLASMA - Abnormal; Notable for the following:    Lactic Acid, Venous 15.9 (*)    All other components within normal limits  LACTIC ACID, PLASMA - Abnormal; Notable for the following:    Lactic Acid, Venous 18.0 (*)    All other components within normal limits  BLOOD GAS, ARTERIAL - Abnormal; Notable for the following:    pH, Arterial 7.230 (*)    pCO2 arterial 21.9 (*)    pO2, Arterial 223 (*)    Bicarbonate 8.8 (*)    Acid-base deficit 17.4 (*)    All other components within normal limits  GLUCOSE, CAPILLARY - Abnormal; Notable for the following:    Glucose-Capillary 266 (*)    All other components within normal limits  GLUCOSE, CAPILLARY - Abnormal; Notable for the following:    Glucose-Capillary 137 (*)    All other components within normal limits  COMPREHENSIVE METABOLIC PANEL - Abnormal; Notable for the following:    Chloride 95 (*)    CO2 10 (*)    Glucose, Bld 58 (*)    BUN 91 (*)    Creatinine, Ser 6.18 (*)    Calcium 6.8 (*)    Total Protein 5.2 (*)    Albumin 2.2 (*)    GFR calc non Af Amer 6 (*)    GFR calc Af Amer 8 (*)    Anion gap 37 (*)    All  other components within normal limits  CBC WITH DIFFERENTIAL/PLATELET - Abnormal; Notable for the following:    WBC 15.0 (*)    RBC 3.23 (*)    Hemoglobin 8.9 (*)    HCT 28.4 (*)    Neutro Abs 10.9 (*)    Monocytes Absolute 2.3 (*)    All other components within normal limits  MAGNESIUM - Abnormal; Notable for the following:    Magnesium 2.8 (*)    All other components within normal limits  PHOSPHORUS - Abnormal; Notable for the following:    Phosphorus 8.1 (*)    All other components within normal limits  AMMONIA - Abnormal; Notable for the following:    Ammonia 72 (*)    All other components within normal limits  BLOOD GAS, ARTERIAL - Abnormal; Notable for the following:    pH, Arterial 7.269 (*)    pCO2 arterial 19.8 (*)    pO2, Arterial 198 (*)    Bicarbonate 8.8 (*)    Acid-base deficit 17.0 (*)    All other components within normal limits  BASIC METABOLIC PANEL - Abnormal; Notable for the following:    Potassium 5.2 (*)    Chloride 98 (*)    CO2 10 (*)    Glucose, Bld 101 (*)    BUN 92 (*)    Creatinine, Ser 5.90 (*)    Calcium 7.2 (*)    GFR calc non Af Amer 7 (*)    GFR calc Af Amer 8 (*)    Anion gap 34 (*)    All other components within normal limits  GLUCOSE, CAPILLARY - Abnormal; Notable for the following:    Glucose-Capillary 57 (*)    All other components within normal limits  GLUCOSE, CAPILLARY - Abnormal; Notable for the following:    Glucose-Capillary 63 (*)  All other components within normal limits  GLUCOSE, CAPILLARY - Abnormal; Notable for the following:    Glucose-Capillary 53 (*)    All other components within normal limits  LACTIC ACID, PLASMA - Abnormal; Notable for the following:    Lactic Acid, Venous 17.0 (*)    All other components within normal limits  LACTIC ACID, PLASMA - Abnormal; Notable for the following:    Lactic Acid, Venous 18.4 (*)    All other components within normal limits  GLUCOSE, CAPILLARY - Abnormal; Notable for the  following:    Glucose-Capillary 119 (*)    All other components within normal limits  GLUCOSE, CAPILLARY - Abnormal; Notable for the following:    Glucose-Capillary 135 (*)    All other components within normal limits  CBC WITH DIFFERENTIAL/PLATELET - Abnormal; Notable for the following:    WBC 15.9 (*)    RBC 3.07 (*)    Hemoglobin 8.5 (*)    HCT 26.1 (*)    RDW 15.6 (*)    Neutro Abs 12.4 (*)    Monocytes Absolute 2.1 (*)    All other components within normal limits  MAGNESIUM - Abnormal; Notable for the following:    Magnesium 2.5 (*)    All other components within normal limits  RENAL FUNCTION PANEL - Abnormal; Notable for the following:    Chloride 87 (*)    CO2 11 (*)    Glucose, Bld 198 (*)    BUN 93 (*)    Creatinine, Ser 6.49 (*)    Calcium 6.2 (*)    Phosphorus 7.9 (*)    Albumin 2.0 (*)    GFR calc non Af Amer 6 (*)    GFR calc Af Amer 7 (*)    Anion gap 42 (*)    All other components within normal limits  BLOOD GAS, ARTERIAL - Abnormal; Notable for the following:    pCO2 arterial 20.1 (*)    pO2, Arterial 161 (*)    Bicarbonate 11.2 (*)    Acid-base deficit 13.2 (*)    All other components within normal limits  AMMONIA - Abnormal; Notable for the following:    Ammonia 60 (*)    All other components within normal limits  GLUCOSE, CAPILLARY - Abnormal; Notable for the following:    Glucose-Capillary 151 (*)    All other components within normal limits  GLUCOSE, CAPILLARY - Abnormal; Notable for the following:    Glucose-Capillary 189 (*)    All other components within normal limits  GLUCOSE, CAPILLARY - Abnormal; Notable for the following:    Glucose-Capillary 194 (*)    All other components within normal limits  GLUCOSE, CAPILLARY - Abnormal; Notable for the following:    Glucose-Capillary 177 (*)    All other components within normal limits  LACTIC ACID, PLASMA - Abnormal; Notable for the following:    Lactic Acid, Venous 19.6 (*)    All other  components within normal limits  GLUCOSE, CAPILLARY - Abnormal; Notable for the following:    Glucose-Capillary 164 (*)    All other components within normal limits  GLUCOSE, CAPILLARY - Abnormal; Notable for the following:    Glucose-Capillary 148 (*)    All other components within normal limits  CBC WITH DIFFERENTIAL/PLATELET - Abnormal; Notable for the following:    WBC 19.5 (*)    RBC 2.97 (*)    Hemoglobin 8.2 (*)    HCT 24.5 (*)    Neutro Abs 14.9 (*)    Monocytes  Absolute 2.8 (*)    All other components within normal limits  MAGNESIUM - Abnormal; Notable for the following:    Magnesium 2.5 (*)    All other components within normal limits  LACTIC ACID, PLASMA - Abnormal; Notable for the following:    Lactic Acid, Venous 20.9 (*)    All other components within normal limits  BLOOD GAS, ARTERIAL - Abnormal; Notable for the following:    pH, Arterial 7.492 (*)    pCO2 arterial 22.8 (*)    pO2, Arterial 154 (*)    Bicarbonate 17.3 (*)    Acid-base deficit 5.5 (*)    All other components within normal limits  GLUCOSE, CAPILLARY - Abnormal; Notable for the following:    Glucose-Capillary 140 (*)    All other components within normal limits  RENAL FUNCTION PANEL - Abnormal; Notable for the following:    Chloride 82 (*)    CO2 17 (*)    Glucose, Bld 138 (*)    BUN 96 (*)    Creatinine, Ser 6.88 (*)    Calcium 6.1 (*)    Phosphorus 7.2 (*)    Albumin 1.8 (*)    GFR calc non Af Amer 6 (*)    GFR calc Af Amer 7 (*)    Anion gap 44 (*)    All other components within normal limits  GLUCOSE, CAPILLARY - Abnormal; Notable for the following:    Glucose-Capillary 135 (*)    All other components within normal limits  GLUCOSE, CAPILLARY - Abnormal; Notable for the following:    Glucose-Capillary 135 (*)    All other components within normal limits  GLUCOSE, CAPILLARY - Abnormal; Notable for the following:    Glucose-Capillary 127 (*)    All other components within normal limits   LACTIC ACID, PLASMA - Abnormal; Notable for the following:    Lactic Acid, Venous 20.1 (*)    All other components within normal limits  BLOOD GAS, ARTERIAL - Abnormal; Notable for the following:    pH, Arterial 7.486 (*)    pCO2 arterial 23.2 (*)    pO2, Arterial 170 (*)    Bicarbonate 17.4 (*)    Acid-base deficit 5.5 (*)    All other components within normal limits  PARATHYROID HORMONE, INTACT (NO CA) - Abnormal; Notable for the following:    PTH 520 (*)    All other components within normal limits  RENAL FUNCTION PANEL - Abnormal; Notable for the following:    Chloride 81 (*)    CO2 16 (*)    BUN 96 (*)    Creatinine, Ser 6.62 (*)    Calcium 6.3 (*)    Phosphorus 7.1 (*)    Albumin 1.8 (*)    GFR calc non Af Amer 6 (*)    GFR calc Af Amer 7 (*)    Anion gap 42 (*)    All other components within normal limits  GLUCOSE, CAPILLARY - Abnormal; Notable for the following:    Glucose-Capillary 132 (*)    All other components within normal limits  CBC WITH DIFFERENTIAL/PLATELET - Abnormal; Notable for the following:    WBC 21.6 (*)    RBC 3.21 (*)    Hemoglobin 8.7 (*)    HCT 26.1 (*)    Neutro Abs 18.5 (*)    Monocytes Absolute 1.8 (*)    All other components within normal limits  BLOOD GAS, ARTERIAL - Abnormal; Notable for the following:    pCO2 arterial 31.4 (*)  pO2, Arterial 71.5 (*)    Acid-base deficit 3.7 (*)    All other components within normal limits  LACTIC ACID, PLASMA - Abnormal; Notable for the following:    Lactic Acid, Venous 9.2 (*)    All other components within normal limits  APTT - Abnormal; Notable for the following:    aPTT 147 (*)    All other components within normal limits  RENAL FUNCTION PANEL - Abnormal; Notable for the following:    Chloride 96 (*)    CO2 21 (*)    BUN 51 (*)    Creatinine, Ser 3.84 (*)    Calcium 6.9 (*)    Albumin 1.9 (*)    GFR calc non Af Amer 12 (*)    GFR calc Af Amer 13 (*)    Anion gap 24 (*)    All other  components within normal limits  RENAL FUNCTION PANEL - Abnormal; Notable for the following:    Glucose, Bld 122 (*)    BUN 28 (*)    Creatinine, Ser 2.18 (*)    Calcium 7.3 (*)    Albumin 2.0 (*)    GFR calc non Af Amer 23 (*)    GFR calc Af Amer 27 (*)    All other components within normal limits  PROTIME-INR - Abnormal; Notable for the following:    Prothrombin Time 20.8 (*)    All other components within normal limits  HEPATIC FUNCTION PANEL - Abnormal; Notable for the following:    Total Protein 5.2 (*)    Albumin 1.9 (*)    AST <5 (*)    ALT 91 (*)    Alkaline Phosphatase 300 (*)    Total Bilirubin 1.3 (*)    Indirect Bilirubin 1.1 (*)    All other components within normal limits  LACTIC ACID, PLASMA - Abnormal; Notable for the following:    Lactic Acid, Venous 3.9 (*)    All other components within normal limits  GLUCOSE, CAPILLARY - Abnormal; Notable for the following:    Glucose-Capillary 111 (*)    All other components within normal limits  CBC WITH DIFFERENTIAL/PLATELET - Abnormal; Notable for the following:    WBC 26.2 (*)    RBC 3.24 (*)    Hemoglobin 8.7 (*)    HCT 26.7 (*)    Neutro Abs 23.6 (*)    Lymphs Abs 0.5 (*)    Monocytes Absolute 2.1 (*)    All other components within normal limits  MAGNESIUM - Abnormal; Notable for the following:    Magnesium 2.5 (*)    All other components within normal limits  APTT - Abnormal; Notable for the following:    aPTT 175 (*)    All other components within normal limits  GLUCOSE, CAPILLARY - Abnormal; Notable for the following:    Glucose-Capillary 118 (*)    All other components within normal limits  RENAL FUNCTION PANEL - Abnormal; Notable for the following:    Glucose, Bld 140 (*)    BUN 21 (*)    Creatinine, Ser 1.63 (*)    Calcium 7.8 (*)    Phosphorus 2.1 (*)    Albumin 1.9 (*)    GFR calc non Af Amer 33 (*)    GFR calc Af Amer 38 (*)    All other components within normal limits  RENAL FUNCTION PANEL -  Abnormal; Notable for the following:    Glucose, Bld 145 (*)    Creatinine, Ser 1.37 (*)  Calcium 7.8 (*)    Phosphorus 1.5 (*)    Albumin 1.8 (*)    GFR calc non Af Amer 40 (*)    GFR calc Af Amer 46 (*)    All other components within normal limits  GLUCOSE, CAPILLARY - Abnormal; Notable for the following:    Glucose-Capillary 112 (*)    All other components within normal limits  GLUCOSE, CAPILLARY - Abnormal; Notable for the following:    Glucose-Capillary 133 (*)    All other components within normal limits  GLUCOSE, CAPILLARY - Abnormal; Notable for the following:    Glucose-Capillary 124 (*)    All other components within normal limits  GLUCOSE, CAPILLARY - Abnormal; Notable for the following:    Glucose-Capillary 145 (*)    All other components within normal limits  GLUCOSE, CAPILLARY - Abnormal; Notable for the following:    Glucose-Capillary 141 (*)    All other components within normal limits  CBG MONITORING, ED - Abnormal; Notable for the following:    Glucose-Capillary 157 (*)    All other components within normal limits  CBG MONITORING, ED - Abnormal; Notable for the following:    Glucose-Capillary 120 (*)    All other components within normal limits  I-STAT CG4 LACTIC ACID, ED - Abnormal; Notable for the following:    Lactic Acid, Venous 13.88 (*)    All other components within normal limits  I-STAT ARTERIAL BLOOD GAS, ED - Abnormal; Notable for the following:    pH, Arterial 7.076 (*)    pCO2 arterial 22.4 (*)    Bicarbonate 6.5 (*)    Acid-base deficit 22.0 (*)    All other components within normal limits  I-STAT VENOUS BLOOD GAS, ED - Abnormal; Notable for the following:    pH, Ven 7.101 (*)    pCO2, Ven 20.9 (*)    pO2, Ven 67.0 (*)    Bicarbonate 6.4 (*)    Acid-base deficit 21.0 (*)    All other components within normal limits  I-STAT VENOUS BLOOD GAS, ED - Abnormal; Notable for the following:    pH, Ven 7.096 (*)    pCO2, Ven 21.8 (*)    pO2, Ven  52.0 (*)    Bicarbonate 6.7 (*)    Acid-base deficit 21.0 (*)    All other components within normal limits  I-STAT ARTERIAL BLOOD GAS, ED - Abnormal; Notable for the following:    pH, Arterial 7.065 (*)    pCO2 arterial 22.6 (*)    pO2, Arterial 215.0 (*)    Bicarbonate 6.4 (*)    Acid-base deficit 22.0 (*)    All other components within normal limits  CULTURE, BLOOD (ROUTINE X 2)  GRAM STAIN  MRSA PCR SCREENING  SALICYLATE LEVEL  URINE RAPID DRUG SCREEN, HOSP PERFORMED  CORTISOL  PROCALCITONIN  GLUCOSE, CAPILLARY  GLUCOSE, CAPILLARY  GLUCOSE, CAPILLARY  GLUCOSE, CAPILLARY  GLUCOSE, CAPILLARY  SODIUM, URINE, RANDOM  CREATININE, URINE, RANDOM  HEPATITIS B SURFACE ANTIGEN  HEPATITIS B SURFACE ANTIBODY  HEPATITIS C ANTIBODY (REFLEX)  GLUCOSE, CAPILLARY  MAGNESIUM  GLUCOSE, CAPILLARY  GLUCOSE, CAPILLARY  GLUCOSE, CAPILLARY  HCV COMMENT:  GLUCOSE, CAPILLARY  GLUCOSE, CAPILLARY  LACTIC ACID, PLASMA  CBC WITH DIFFERENTIAL/PLATELET  MAGNESIUM  RENAL FUNCTION PANEL  RENAL FUNCTION PANEL  APTT  I-STAT TROPOININ, ED    EKG  EKG Interpretation  Date/Time:  Tuesday June 20 2016 20:41:57 EDT Ventricular Rate:  95 PR Interval:    QRS Duration: 113 QT Interval:  412  QTC Calculation: 518 R Axis:   -24 Text Interpretation:  Sinus rhythm Borderline intraventricular conduction delay Prolonged QT interval No significant change since last tracing Confirmed by Winfred Leeds  MD, SAM [phone]) on 06/20/2016 8:47:49 PM       Radiology Dg Chest Port 1 View  Result Date: 06/26/2016 CLINICAL DATA:  Hypoxia EXAM: PORTABLE CHEST 1 VIEW COMPARISON:  June 24, 2016 FINDINGS: The endotracheal tube tip is at the carina. Central catheter tip is at the cavoatrial junction. Nasogastric tube tip and side port are in the distal stomach. No pneumothorax. There is no edema or consolidation. Heart size and pulmonary vascularity are normal. No adenopathy. There is atherosclerotic calcification  in the aorta. IMPRESSION: Endotracheal tube at carina. Advise withdrawing 2-3 cm. No edema or consolidation. Stable cardiac silhouette. There is aortic atherosclerosis. These results will be called to the ordering clinician or representative by the Radiologist Assistant, and communication documented in the PACS or zVision Dashboard. Electronically Signed   By: Lowella Grip III M.D.   On: 06/26/2016 07:13    Procedures Procedures (including critical care time) Intubation Date/Time: 06/20/16/11PM Performed by: Voncille Lo Authorized by: Dr. Winfred Leeds Consent: The procedure was performed in an emergent situation. Risks and benefits: risks, benefits and alternatives were discussed Test results: test results available and properly labeled Site marked: the operative site was marked Imaging studies: imaging studies available Required items: required blood products, implants, devices, and special equipment available Patient identity confirmed: anonymous protocol, patient vented/unresponsive, hospital-assigned identification number and arm band Time out: Immediately prior to procedure a "time out" was called to verify the correct patient, procedure, equipment, support staff and site/side marked as required. Indications: respiratory failure and airway protection Intubation method: fiberoptic oral Patient status: unconscious Preoxygenation:  and BVM Pretreatment medications: none Sedatives: see MAR for details, ketamine Paralytic: none Laryngoscope size: 4 mac Tube size: 7.5 mm Tube type: cuffed Number of attempts: 1 Cricoid pressure: no Cords visualized: yes Post-procedure assessment: chest rise,  ETCO2 monitor and CO2 detector Breath sounds: equal and absent over the epigastrium Cuff inflated: yes ETT to lip: 22 cm ETT to teeth: 21 cm Tube secured with: ETT holder, adhesive tape, umbilical tape, oral airway and bite block Chest x-ray interpreted by me. Chest x-ray findings:  endotracheal tube at carina therefore pulled back 2 cm Patient tolerance: Patient tolerated the procedure well with no immediate complications.  Medications Ordered in ED Medications  Ketamine HCl-Sodium Chloride 100-0.9 MG/10ML-% SOSY (not administered)  0.9 %  sodium chloride infusion (0 mLs Intravenous Stopped 06/26/16 0000)  heparin injection 5,000 Units (5,000 Units Subcutaneous Given 06/26/16 1449)  sodium bicarbonate 1 mEq/mL injection (not administered)  famotidine (PEPCID) IVPB 20 mg premix (20 mg Intravenous Given 06/26/16 1000)  aspirin suppository 150 mg (150 mg Rectal Given 06/26/16 1000)  fentaNYL (SUBLIMAZE) 100 MCG/2ML injection (not administered)  chlorhexidine gluconate (MEDLINE KIT) (PERIDEX) 0.12 % solution 15 mL (15 mLs Mouth Rinse Given 06/26/16 1959)  MEDLINE mouth rinse (15 mLs Mouth Rinse Given 06/26/16 1600)  insulin aspart (novoLOG) injection 0-9 Units (1 Units Subcutaneous Given 06/26/16 1625)  0.9 %  sodium chloride infusion ( Intra-arterial New Bag/Given 06/26/16 0023)  fentaNYL (SUBLIMAZE) injection 25-100 mcg (100 mcg Intravenous Given 06/25/16 1225)  heparin injection 1,000-6,000 Units (not administered)  heparin 10,000 units/ 20 mL infusion syringe (not administered)  heparin bolus via infusion syringe 1,000 Units (not administered)  sodium chloride 0.9 % primer fluid for CRRT (not administered)  prismasol BGK 4/2.5 5,000 mL dialysis  replacement fluid ( CRRT New Bag/Given 06/26/16 1630)  prismasol BGK 4/2.5 5,000 mL dialysis replacement fluid ( CRRT New Bag/Given 06/26/16 1631)  prismasol BGK 4/2.5 5,000 mL dialysis solution ( CRRT New Bag/Given 06/26/16 1630)  meropenem (MERREM) 1 g in sodium chloride 0.9 % 100 mL IVPB (1 g Intravenous Given 06/26/16 1000)  feeding supplement (VITAL HIGH PROTEIN) liquid 1,000 mL (1,000 mLs Per Tube Given 06/26/16 1951)  Darbepoetin Alfa (ARANESP) injection 100 mcg (100 mcg Intravenous Given 06/26/16 1200)  levofloxacin (LEVAQUIN) IVPB  750 mg (750 mg Intravenous New Bag/Given 06/20/16 2232)  aztreonam (AZACTAM) 2 g in dextrose 5 % 50 mL IVPB (0 g Intravenous Stopped 06/20/16 2313)  sodium chloride 0.9 % bolus 2,925 mL (2,925 mLs Intravenous New Bag/Given 06/20/16 2218)  vancomycin (VANCOCIN) 1,750 mg in sodium chloride 0.9 % 500 mL IVPB (1,750 mg Intravenous New Bag/Given 06/20/16 2231)  calcium gluconate 3 g in sodium chloride 0.9 % 100 mL IVPB (3 g Intravenous New Bag/Given 06/20/16 2356)  phenylephrine (NEO-SYNEPHRINE) injection (0.2 mg Intravenous Given 06/20/16 2311)  ketamine (KETALAR) injection (100 mg Intravenous Given 06/20/16 2318)  fentaNYL (SUBLIMAZE) injection (150 mcg Intravenous Given 06/20/16 2319)  sodium chloride 0.9 % bolus 2,000 mL (2,000 mLs Intravenous Given 06/21/16 0111)  sodium chloride 0.9 % bolus 1,000 mL (1,000 mLs Intravenous Given 06/21/16 0415)  sodium bicarbonate injection 100 mEq (100 mEq Intravenous Given 06/21/16 0534)  sodium bicarbonate 1 mEq/mL injection (  Duplicate 5/36/14 4315)  dextrose 50 % solution 25 mL (25 mLs Intravenous Given 06/21/16 2009)  dextrose 50 % solution 25 mL (25 mLs Intravenous Given 06/21/16 2145)  sodium chloride 0.9 % bolus 500 mL (500 mLs Intravenous Given 06/22/16 0135)  dextrose 50 % solution 25 mL (25 mLs Intravenous Given 06/22/16 0344)  calcium gluconate 2 g in sodium chloride 0.9 % 100 mL IVPB (2 g Intravenous Given 06/23/16 0500)  calcium gluconate 2 g in sodium chloride 0.9 % 100 mL IVPB (2 g Intravenous Given 06/24/16 0610)  white petrolatum (VASELINE) gel (  Given 06/25/16 1224)     Initial Impression / Assessment and Plan / ED Course  I have reviewed the triage vital signs and the nursing notes.  Pertinent labs & imaging results that were available during my care of the patient were reviewed by me and considered in my medical decision making (see chart for details).  Clinical Course   Initial Impression & Plan Patient presents acutely altered w/ PMH significant  for CKD, DM, and multiple previous CVA and recent treatment for UTI. Is debilitated and chronically ill and in hospital bed majority of day. Found unresponsive when daughter got home this evening. Hypoglycemic, corrected by EMS. Arrives GCS 8.   Code discussion had with daughter who states patient would have wanted Full Code Status. Therefore due to hypoxia, significant acidosis and hypercapnia and hypotension, patient intubated and bicarbonate ggt initiated.  Because of my suspicion for an infectious etiology and the clinical criteria for Sepsis was met, I initiated a "Code Sepsis". I obtained rapid IV access along w/ screening labs, serial lactates, multiple blood cultures, & urine studies/cultures. I also obtained CXR to look for other possible sources of infection.  Obvious source was initially unclear, therefore I administered empiric IV antibiotic regimen of Vanc, aztreonam and levaquin due to severe PCN allergy.  I administered IV Fluid resuscitation w/ an initial bolus of 30 ml/kg of NS.  ED Course & Results Labs also reveal significant lactic acidosis and hyperkalemia. Evaluation  also consistent w/ acute encephalopathy but not concerning for seizures.   Final Disposition Reassessment of the patient reveals MAPs improving w/ IVF however remains critically ill, and in light of above & the complexity of the patient's medical condition, I believe the patient will require admission for continued medical intervention. ED Course in its entirety was reviewed w/ the daughter. I therefore consulted the MICU Service for admission & we discussed the patient's ED course & they have agreed to admit. CVC access obtained in the ED by MICU provider in the ED. Level of Care determined by the Admitting Service.   Final Clinical Impressions(s) / ED Diagnoses   Final diagnoses:  Abdominal pain  Necrotizing fasciitis ([place])  Subcutaneous emphysema (HCC)  Acute respiratory failure (HCC)  SBO (small bowel  obstruction)  AKI (acute kidney injury) ([place])  Encounter for central line placement  Respiratory failure [facility])    New Prescriptions Current Discharge Medication List       Voncille Lo, MD 06/26/16 2027    Orlie Dakin, MD 06/27/16 0009

## 2016-06-20 NOTE — Procedures (Signed)
Central Venous Catheter Insertion Procedure Note Angelica Beck [id number] [birth year]  Procedure: Insertion of Central Venous Catheter Indications: Assessment of intravascular volume, Drug and/or fluid administration and Frequent blood sampling  Procedure Details Consent: Risks of procedure as well as the alternatives and risks of each were explained to the (patient/caregiver).  Consent for procedure obtained. Time Out: Verified patient identification, verified procedure, site/side was marked, verified correct patient position, special equipment/implants available, medications/allergies/relevent history reviewed, required imaging and test results available.  Performed  Maximum sterile technique was used including antiseptics, cap, gloves, gown, hand hygiene, mask and sheet. Skin prep: Chlorhexidine; local anesthetic administered A antimicrobial bonded/coated triple lumen catheter was placed in the left internal jugular vein using the Seldinger technique. Ultrasound guidance used.Yes.   Catheter placed to 20 cm. Blood aspirated via all 3 ports and then flushed x 3. Line sutured x 2 and dressing applied.  Evaluation Blood flow good Complications: No apparent complications Patient did tolerate procedure well. Chest X-ray ordered to verify placement.  CXR: pending.  Georgann Housekeeper, AGACNP-BC [facility] Pulmonology/Critical Care Pager [phone] or [phone]  06/20/2016 11:57 PM

## 2016-06-20 NOTE — Progress Notes (Signed)
Critical ABG results given to Voncille Lo, MD. Orders for entitled nasal cannula received. RT will continue to monitor and await new orders.

## 2016-06-20 NOTE — Progress Notes (Signed)
Pharmacy Antibiotic Note  Angelica Beck is a 65 y.o. female admitted on 06/20/2016 with AMS and hypoglycemia.  Pharmacy consulted to initiate vancomycin, aztreonam and levofloxacin for sepsis.  First doses of antibiotics are already ordered.  Patient has acute on chronic kidney disease.   Plan: - Vanc '1750mg'$  IV x 1, then 1gm IV Q48H - Azactam 2gm IV x 1, then '500mg'$  IV Q8H - LVQ '750mg'$  IV x 1, then '500mg'$  IV Q48H - Monitor renal fxn, clinical progress, vanc trough as indicated  Height: '5\' 6"'$  (167.6 cm) Weight: 215 lb (97.5 kg) IBW/kg (Calculated) : 59.3  Temp (24hrs), Avg:99.9 F (37.7 C), Min:99.9 F (37.7 C), Max:99.9 F (37.7 C)   Recent Labs Lab 06/20/16 2058 06/20/16 2109  WBC 12.3*  --   CREATININE 6.51*  --   LATICACIDVEN  --  13.88*    Estimated Creatinine Clearance: 10.4 mL/min (by C-G formula based on SCr of 6.51 mg/dL (H)).    Allergies  Allergen Reactions  . Lactose Intolerance (Gi) Diarrhea and Nausea And Vomiting  . Penicillins Swelling    Facial swelling    Antimicrobials this admission:  Vanc 9/26 >> Azactam 9/26 >> LVQ 9/26 >>  Dose adjustments this admission:  N/A  Microbiology results:  Pending   [name] D. Mina Marble, PharmD, BCPS Pager:  [phone] 06/20/2016, 10:17 PM

## 2016-06-20 NOTE — ED Triage Notes (Signed)
Pt brought to ED by [facility] from home for mental status change, pt had insuline given by family round 1830 to 1900, found around 2000 acting confuse and lethargic, EMS called by daughter, glucagon and d10 IV given by EMS PTA sugar for ems 39, BP 96/43 with HR 97. CBG 157 on ED arrival, pt very lethargic on ED arrival.

## 2016-06-20 NOTE — ED Provider Notes (Signed)
Level V caveat patient unresponsive. Dr. Freda Munro tried to call family, no answer. History is from EMS. Patient found unresponsive. Reportedly hypoglycemic with blood sugar of 39. With IM glucagon and D10 intravenously. Blood sugar went to 157. Patient is presently obtunded response to noxious stimulus with non-purposeful movement. Gag reflex is intact. Eyes with pinpoint pupils bilaterally. Lungs clear auscultation abdomen obese, nontender. Extremities without edema. Patient noted to have profound metabolic acidosis.  Patient was intubated by Dr. Freda Munro after she was premedicated with ketamine and Neo-Synephrine intravenously. Timeout performed. Successfully intubated using kaleidoscope and 7.5 ET tube I was present during entire procedure. Chest x-ray checked by me shows ET tube sufficiently above carina. 11:55 PM patient remains hypotensive after having received approximately 2.5 L of normal saline. Intravenous leave a fed ordered. Intensivist service consulted and evaluated patient in the ED. Further history from patient's daughter, patient was alert and talkative this morning. She began to decline approximate 1 PM today when she became progressively less alert and baseline patient is bedbound, alert and oriented CRITICAL CARE Performed by: Orlie Dakin Total critical care time: 40 minutes Critical care time was exclusive of separately billable procedures and treating other patients. Critical care was necessary to treat or prevent imminent or life-threatening deterioration. Critical care was time spent personally by me on the following activities: development of treatment plan with patient and/or surrogate as well as nursing, discussions with consultants, evaluation of patient's response to treatment, examination of patient, obtaining history from patient or surrogate, ordering and performing treatments and interventions, ordering and review of laboratory studies, ordering and review of radiographic  studies, pulse oximetry and re-evaluation of patient's condition.   Orlie Dakin, MD 06/21/16 4431

## 2016-06-20 NOTE — ED Notes (Signed)
CRITICAL VALUE ALERT  Critical value received:  Potassium 7.4

## 2016-06-20 NOTE — H&P (Signed)
PULMONARY / CRITICAL CARE MEDICINE   Name: Angelica Beck MRN: [id number] DOB: 04-May-1951    ADMISSION DATE:  06/20/2016 CONSULTATION DATE:  06/20/2016  REFERRING MD:  EDP Dr Cathleen Fears   CHIEF COMPLAINT:  AMS  HISTORY OF PRESENT ILLNESS:   65 year old female with past medical history as below, which is significant for stroke with subsequent debilitation, diabetes, chronic kidney disease, chronic urinary tract infections due to incontinence, for which she takes chronic antibiotics. She lives at home with her daughter where she requires full assistance she is unable to truly ambulate and spends most of her time in a hospital bed/recliner. She relies on family members almost entirely for activities of daily living. She was in her usual state of health until about 9/23 when she developed nausea and vomiting. This persisted for 2 days until symptoms subsided, however, she continued to complain of abdominal pain. 9/26 when the patient's daughter got home from work the patient was found to be unresponsive. EMS was called and she was found hypoglycemic which was corrected, however, altered mental status did not improve. Upon arrival to the emergency department she continued to be lethargic and was found to be hypotensive with hypoxemia. Laboratory evaluation significant for potassium 7.4, bicarbonate less than 7, creatinine 6.51, BUN 95, WBC 12.3, lactic acid 13.88, pH 7.07.  PAST MEDICAL HISTORY :  She  has a past medical history of Arthritis; Diabetes mellitus, type 2 ([place]); Hyperlipidemia; Hypertension; Hyperthyroidism; Obesity hypoventilation syndrome ([place]) (06/20/2013); and Stroke [facility]).  PAST SURGICAL HISTORY: She  has a past surgical history that includes Ventral hernia repair.  Allergies  Allergen Reactions  . Lactose Intolerance (Gi) Diarrhea and Nausea And Vomiting  . Penicillins Swelling    Facial swelling    No current facility-administered medications on file prior to encounter.     Current Outpatient Prescriptions on File Prior to Encounter  Medication Sig  . albuterol (PROVENTIL HFA;VENTOLIN HFA) 108 (90 BASE) MCG/ACT inhaler Inhale 2 puffs into the lungs every 6 (six) hours as needed for wheezing or shortness of breath.  . Cholecalciferol (VITAMIN D) 2000 UNITS tablet Take 2,000 Units by mouth daily.  . clopidogrel (PLAVIX) 75 MG tablet Take 1 tablet (75 mg total) by mouth daily.  Marland Kitchen gabapentin (NEURONTIN) 100 MG capsule Take 1 capsule (100 mg total) by mouth at bedtime.  . insulin glargine (LANTUS) 100 UNIT/ML injection 10 units at breakfast and 20 units every evening  . metoprolol succinate (TOPROL-XL) 25 MG 24 hr tablet Take 3 tablets (75 mg total) by mouth daily.  . Multiple Vitamin (MULTIVITAMIN WITH MINERALS) TABS tablet Take 1 tablet by mouth daily.  . niacin (NIASPAN) 1000 MG CR tablet Take 1 tablet (1,000 mg total) by mouth at bedtime.  . nitroGLYCERIN (NITROSTAT) 0.4 MG SL tablet Place 1 tablet (0.4 mg total) under the tongue every 5 (five) minutes as needed for chest pain.  Marland Kitchen propylthiouracil (PTU) 50 MG tablet Take 1 tablet (50 mg total) by mouth 2 (two) times daily.  . ranitidine (ZANTAC) 150 MG tablet Take 150 mg by mouth 2 (two) times daily.   . rosuvastatin (CRESTOR) 40 MG tablet Take 1 tablet (40 mg total) by mouth at bedtime.    FAMILY HISTORY:  Her indicated that the status of her mother is unknown.    SOCIAL HISTORY: She  reports that she quit smoking about 13 years ago. Her smoking use included Cigarettes. She has a 7.50 pack-year smoking history. She has never used smokeless tobacco. She reports that  she does not drink alcohol or use drugs.  REVIEW OF SYSTEMS:   unable  SUBJECTIVE:    VITAL SIGNS: BP (!) 101/50 (BP Location: Left Arm)   Pulse 84   Temp 99.9 F (37.7 C) (Rectal)   Resp 22   Ht '5\' 6"'$  (1.676 m)   Wt 97.5 kg (215 lb)   SpO2 100%   BMI 34.70 kg/m   HEMODYNAMICS:    VENTILATOR SETTINGS:    INTAKE / OUTPUT: No  intake/output data recorded.  PHYSICAL EXAMINATION: General:  Frail elderly female in NAD Neuro:  Obtunded HEENT:  [place]/AT, PERRL, no JVD Cardiovascular:  RRR, no MRG Lungs:  Clear bilateral breath sounds Abdomen:  Soft, non-distended Musculoskeletal:  No acute deformity Skin:  Grossly inact  LABS:  BMET  Recent Labs Lab 06/20/16 2058  NA 138  K 7.4*  CL 95*  CO2 <7*  BUN 95*  CREATININE 6.51*  GLUCOSE 136*    Electrolytes  Recent Labs Lab 06/20/16 2058  CALCIUM 8.8*    CBC  Recent Labs Lab 06/20/16 2058  WBC 12.3*  HGB 11.2*  HCT 37.7  PLT 313    Coag's No results for input(s): APTT, INR in the last 168 hours.  Sepsis Markers  Recent Labs Lab 06/20/16 2109  LATICACIDVEN 13.88*    ABG  Recent Labs Lab 06/20/16 2125  PHART 7.076*  PCO2ART 22.4*  PO2ART 85.0    Liver Enzymes  Recent Labs Lab 06/20/16 2058  AST 30  ALT 12*  ALKPHOS 59  BILITOT 0.9  ALBUMIN 2.9*    Cardiac Enzymes No results for input(s): TROPONINI, PROBNP in the last 168 hours.  Glucose  Recent Labs Lab 06/20/16 2037  GLUCAP 157*    Imaging No results found.   STUDIES:  CT head 9/26 > CT abdomen 9/26 >  CULTURES: Blood x 2 9/26 > Urine 9/26 > Tracheal aspirate 9/26 >  ANTIBIOTICS: Levaquin 9/26 > Aztreonam 9/26 > Vancomycin 9/26 >  SIGNIFICANT EVENTS: 9/26 admit for presumed septic shock  LINES/TUBES: ETT 9/26 > CVL 9/26 >  DISCUSSION: 65 year old female with hx stroke and severe debilitation presenting with AMS 9/26. Presumed septic shock, however, source not yet clear. Intubated in ED. Will support with vent, pressors as needed, and ABX. Follow cultures and CT head/abdomen.   ASSESSMENT / PLAN:  PULMONARY A: Inability to protect airway secondary to acute encephalopathy  P:   STAT intubation Full vent support CXR, ABG VAP precautions  CARDIOVASCULAR A:  Shock, suspect septic Elevated lactic acid H/o HTN, HLD  P:  Tele  monitoring Aggressive IVF Levophed to keep MAP > 2m/Hg Place CVL Echo Trend lactic, troponin  RENAL A:   Acute renal injury Chronic kidney disease Hyperkalemia  P:   Calcium and bicarb given by ED Continue Bicarb infusion Hydrate Trend BMP May need CVVHD CT abdomen to assess for hydro  GASTROINTESTINAL A:   Abdominal Pain Nutrition  P:   NPO NGT to LIS CT abdomen  HEMATOLOGIC A:   Mild anemia, above baseline. Suspect hemoconcentration.  P:  Hydrate Follow CBC  INFECTIOUS A:   Septic shock, likely urinary source, but at this point cannot rule out pulmonary or intraabdominal.   P:   ABX as above, PCN allergy Follow cultures PCT CT abdomen pending  ENDOCRINE A:   DM  P:   CBG monitoring and SSI  NEUROLOGIC A:   Acute metabolic encephalopathy P:   RASS goal: 0 to -1 PRN fentanyl and  versed   FAMILY  - Updates: Extensive discussion with daughter. She is aware poor prognosis based on chronic medical issues and acuity of present illness. She wishes for full code and full aggressive interventions.   - Inter-disciplinary family meet or Palliative Care meeting due by:  10/2   Georgann Housekeeper, AGACNP-BC [place] Pulmonology/Critical Care Pager [phone] or [phone]  06/20/2016 10:53 PM

## 2016-06-21 ENCOUNTER — Inpatient Hospital Stay (HOSPITAL_COMMUNITY): Payer: Medicare Other

## 2016-06-21 ENCOUNTER — Other Ambulatory Visit (HOSPITAL_COMMUNITY): Payer: Medicare Other

## 2016-06-21 DIAGNOSIS — G9340 Encephalopathy, unspecified: Secondary | ICD-10-CM

## 2016-06-21 DIAGNOSIS — R0600 Dyspnea, unspecified: Secondary | ICD-10-CM

## 2016-06-21 DIAGNOSIS — J9600 Acute respiratory failure, unspecified whether with hypoxia or hypercapnia: Secondary | ICD-10-CM

## 2016-06-21 LAB — BLOOD CULTURE ID PANEL (REFLEXED)
Acinetobacter baumannii: NOT DETECTED
CANDIDA ALBICANS: NOT DETECTED
CANDIDA GLABRATA: NOT DETECTED
CANDIDA PARAPSILOSIS: NOT DETECTED
CANDIDA TROPICALIS: NOT DETECTED
Candida krusei: NOT DETECTED
ENTEROBACTER CLOACAE COMPLEX: NOT DETECTED
ENTEROBACTERIACEAE SPECIES: NOT DETECTED
Enterococcus species: NOT DETECTED
Escherichia coli: NOT DETECTED
Haemophilus influenzae: NOT DETECTED
KLEBSIELLA OXYTOCA: NOT DETECTED
KLEBSIELLA PNEUMONIAE: NOT DETECTED
Listeria monocytogenes: NOT DETECTED
Methicillin resistance: NOT DETECTED
NEISSERIA MENINGITIDIS: NOT DETECTED
PSEUDOMONAS AERUGINOSA: NOT DETECTED
Proteus species: NOT DETECTED
STREPTOCOCCUS PNEUMONIAE: NOT DETECTED
STREPTOCOCCUS PYOGENES: NOT DETECTED
STREPTOCOCCUS SPECIES: NOT DETECTED
Serratia marcescens: NOT DETECTED
Staphylococcus aureus (BCID): NOT DETECTED
Staphylococcus species: DETECTED — AB
Streptococcus agalactiae: NOT DETECTED

## 2016-06-21 LAB — GLUCOSE, CAPILLARY
GLUCOSE-CAPILLARY: 132 mg/dL — AB (ref 65–99)
GLUCOSE-CAPILLARY: 137 mg/dL — AB (ref 65–99)
GLUCOSE-CAPILLARY: 149 mg/dL — AB (ref 65–99)
GLUCOSE-CAPILLARY: 57 mg/dL — AB (ref 65–99)
GLUCOSE-CAPILLARY: 63 mg/dL — AB (ref 65–99)
GLUCOSE-CAPILLARY: 93 mg/dL (ref 65–99)
Glucose-Capillary: 254 mg/dL — ABNORMAL HIGH (ref 65–99)
Glucose-Capillary: 266 mg/dL — ABNORMAL HIGH (ref 65–99)
Glucose-Capillary: 87 mg/dL (ref 65–99)
Glucose-Capillary: 77 mg/dL (ref 65–99)

## 2016-06-21 LAB — TROPONIN I
TROPONIN I: 0.06 ng/mL — AB (ref <0.03)
Troponin I: 0.04 ng/mL (ref <0.03)
Troponin I: 0.06 ng/mL (ref <0.03)

## 2016-06-21 LAB — MAGNESIUM
MAGNESIUM: 2.7 mg/dL — AB (ref 1.7–2.4)
Magnesium: 2.6 mg/dL — ABNORMAL HIGH (ref 1.7–2.4)

## 2016-06-21 LAB — CBC
HCT: 31.4 % — ABNORMAL LOW (ref 36.0–46.0)
HEMATOCRIT: 30.9 % — AB (ref 36.0–46.0)
HEMOGLOBIN: 9.1 g/dL — AB (ref 12.0–15.0)
HEMOGLOBIN: 9.3 g/dL — AB (ref 12.0–15.0)
MCH: 27.3 pg (ref 26.0–34.0)
MCH: 27.3 pg (ref 26.0–34.0)
MCHC: 29.4 g/dL — ABNORMAL LOW (ref 30.0–36.0)
MCHC: 29.6 g/dL — AB (ref 30.0–36.0)
MCV: 92.8 fL (ref 78.0–100.0)
MCV: 92.1 fL (ref 78.0–100.0)
Platelets: 236 10*3/uL (ref 150–400)
Platelets: 265 10*3/uL (ref 150–400)
RBC: 3.33 MIL/uL — AB (ref 3.87–5.11)
RBC: 3.41 MIL/uL — ABNORMAL LOW (ref 3.87–5.11)
RDW: 15.7 % — ABNORMAL HIGH (ref 11.5–15.5)
RDW: 15.6 % — ABNORMAL HIGH (ref 11.5–15.5)
WBC: 13.8 10*3/uL — ABNORMAL HIGH (ref 4.0–10.5)
WBC: 14.6 10*3/uL — ABNORMAL HIGH (ref 4.0–10.5)

## 2016-06-21 LAB — BASIC METABOLIC PANEL
ANION GAP: 29 — AB (ref 5–15)
Anion gap: 30 — ABNORMAL HIGH (ref 5–15)
Anion gap: 34 — ABNORMAL HIGH (ref 5–15)
BUN: 87 mg/dL — ABNORMAL HIGH (ref 6–20)
BUN: 88 mg/dL — AB (ref 6–20)
BUN: 92 mg/dL — AB (ref 6–20)
CHLORIDE: 101 mmol/L (ref 101–111)
CHLORIDE: 98 mmol/L — AB (ref 101–111)
CO2: 7 mmol/L — AB (ref 22–32)
CO2: 9 mmol/L — AB (ref 22–32)
CO2: 10 mmol/L — ABNORMAL LOW (ref 22–32)
CREATININE: 5.55 mg/dL — AB (ref 0.44–1.00)
CREATININE: 5.9 mg/dL — AB (ref 0.44–1.00)
Calcium: 7.9 mg/dL — ABNORMAL LOW (ref 8.9–10.3)
Calcium: 7.4 mg/dL — ABNORMAL LOW (ref 8.9–10.3)
Calcium: 7.2 mg/dL — ABNORMAL LOW (ref 8.9–10.3)
Chloride: 105 mmol/L (ref 101–111)
Creatinine, Ser: 5.65 mg/dL — ABNORMAL HIGH (ref 0.44–1.00)
GFR calc Af Amer: 8 mL/min — ABNORMAL LOW (ref >60)
GFR calc Af Amer: 9 mL/min — ABNORMAL LOW (ref >60)
GFR calc Af Amer: 8 mL/min — ABNORMAL LOW (ref >60)
GFR calc non Af Amer: 7 mL/min — ABNORMAL LOW (ref >60)
GFR calc non Af Amer: 7 mL/min — ABNORMAL LOW (ref >60)
GFR calc non Af Amer: 7 mL/min — ABNORMAL LOW (ref >60)
GLUCOSE: 156 mg/dL — AB (ref 65–99)
GLUCOSE: 101 mg/dL — AB (ref 65–99)
Glucose, Bld: 269 mg/dL — ABNORMAL HIGH (ref 65–99)
POTASSIUM: 5.9 mmol/L — AB (ref 3.5–5.1)
Potassium: 5.7 mmol/L — ABNORMAL HIGH (ref 3.5–5.1)
Potassium: 5.2 mmol/L — ABNORMAL HIGH (ref 3.5–5.1)
SODIUM: 140 mmol/L (ref 135–145)
SODIUM: 142 mmol/L (ref 135–145)
Sodium: 141 mmol/L (ref 135–145)

## 2016-06-21 LAB — BLOOD GAS, ARTERIAL
ACID-BASE DEFICIT: 22.8 mmol/L — AB (ref 0.0–2.0)
ACID-BASE DEFICIT: 17.4 mmol/L — AB (ref 0.0–2.0)
Acid-base deficit: 18.4 mmol/L — ABNORMAL HIGH (ref 0.0–2.0)
BICARBONATE: 5.3 mmol/L — AB (ref 20.0–28.0)
BICARBONATE: 8.8 mmol/L — AB (ref 20.0–28.0)
Bicarbonate: 8.4 mmol/L — ABNORMAL LOW (ref 20.0–28.0)
DRAWN BY: 39899
Drawn by: 345601
Drawn by: 398991
FIO2: 40
FIO2: 40
FIO2: 40
LHR: 30 {breaths}/min
LHR: 18 {breaths}/min
MECHVT: 540 mL
MECHVT: 470 mL
O2 SAT: 98.6 %
O2 Saturation: 98.4 %
O2 Saturation: 98.9 %
PATIENT TEMPERATURE: 98.6
PCO2 ART: 23.5 mmHg — AB (ref 32.0–48.0)
PEEP/CPAP: 5 cmH2O
PEEP/CPAP: 5 cmH2O
PEEP: 5 cmH2O
PH ART: 7.18 — AB (ref 7.350–7.450)
PO2 ART: 203 mmHg — AB (ref 83.0–108.0)
PO2 ART: 197 mmHg — AB (ref 83.0–108.0)
PO2 ART: 223 mmHg — AB (ref 83.0–108.0)
Patient temperature: 97.6
Patient temperature: 98.9
RATE: 30 resp/min
VT: 470 mL
pCO2 arterial: 21.9 mmHg — ABNORMAL LOW (ref 32.0–48.0)
pH, Arterial: 7.099 — CL (ref 7.350–7.450)
pH, Arterial: 7.23 — ABNORMAL LOW (ref 7.350–7.450)

## 2016-06-21 LAB — ECHOCARDIOGRAM COMPLETE
AOPV: 0.73 m/s
AV Area VTI: 2.54 cm2
AV Peak grad: 28 mmHg
AV peak Index: 1.15
AVPKVEL: 263 cm/s
CHL CUP DOP CALC LVOT VTI: 23 cm
E decel time: 141 msec
FS: 37 % (ref 28–44)
HEIGHTINCHES: 66 in
IV/PV OW: 1.18
LA diam end sys: 36 mm
LA diam index: 1.63 cm/m2
LA vol: 52.3 mL
LASIZE: 36 mm
LAVOLA4C: 48.5 mL
LAVOLIN: 23.7 mL/m2
LDCA: 3.46 cm2
LV TDI E'LATERAL: 14.4
LV TDI E'MEDIAL: 5.66
LVELAT: 14.4 cm/s
LVOT diameter: 21 mm
LVOT peak grad rest: 15 mmHg
LVOTPV: 193 cm/s
LVOTSV: 80 mL
MV Dec: 141
MV pk E vel: 0.9 m/s
PW: 11 mm — AB (ref 0.6–1.1)
Weight: 3548.52 oz

## 2016-06-21 LAB — CORTISOL: Cortisol, Plasma: 45.1 ug/dL

## 2016-06-21 LAB — PHOSPHORUS
PHOSPHORUS: 8.6 mg/dL — AB (ref 2.5–4.6)
Phosphorus: 8.5 mg/dL — ABNORMAL HIGH (ref 2.5–4.6)

## 2016-06-21 LAB — LACTIC ACID, PLASMA
LACTIC ACID, VENOUS: 10 mmol/L — AB (ref 0.5–1.9)
LACTIC ACID, VENOUS: 11.3 mmol/L — AB (ref 0.5–1.9)
Lactic Acid, Venous: 15.9 mmol/L (ref 0.5–1.9)

## 2016-06-21 LAB — PROCALCITONIN: PROCALCITONIN: 5.09 ng/mL

## 2016-06-21 LAB — PROTIME-INR
INR: 1.85
PROTHROMBIN TIME: 21.6 s — AB (ref 11.4–15.2)

## 2016-06-21 LAB — BETA-HYDROXYBUTYRIC ACID

## 2016-06-21 LAB — MRSA PCR SCREENING: MRSA BY PCR: NEGATIVE

## 2016-06-21 MED ORDER — FENTANYL CITRATE (PF) 100 MCG/2ML IJ SOLN
25.0000 ug | INTRAMUSCULAR | Status: DC | PRN
  Administered 2016-06-21 – 2016-07-05 (×9): 100 ug via INTRAVENOUS
  Administered 2016-07-07: 25 ug via INTRAVENOUS
  Administered 2016-07-07: 100 ug via INTRAVENOUS
  Filled 2016-06-21 (×11): qty 2

## 2016-06-21 MED ORDER — CHLORHEXIDINE GLUCONATE 0.12% ORAL RINSE (MEDLINE KIT)
15.0000 mL | Freq: Two times a day (BID) | OROMUCOSAL | Status: DC
  Administered 2016-06-21 – 2016-06-27 (×14): 15 mL via OROMUCOSAL

## 2016-06-21 MED ORDER — INSULIN ASPART 100 UNIT/ML ~~LOC~~ SOLN
0.0000 [IU] | SUBCUTANEOUS | Status: DC
  Administered 2016-06-21: 1 [IU] via SUBCUTANEOUS
  Administered 2016-06-21: 5 [IU] via SUBCUTANEOUS
  Administered 2016-06-22: 2 [IU] via SUBCUTANEOUS
  Administered 2016-06-22: 1 [IU] via SUBCUTANEOUS
  Administered 2016-06-23 (×2): 2 [IU] via SUBCUTANEOUS
  Administered 2016-06-23 (×2): 1 [IU] via SUBCUTANEOUS
  Administered 2016-06-23 (×2): 2 [IU] via SUBCUTANEOUS
  Administered 2016-06-24 – 2016-06-26 (×9): 1 [IU] via SUBCUTANEOUS
  Administered 2016-06-26: 2 [IU] via SUBCUTANEOUS
  Administered 2016-06-27: 1 [IU] via SUBCUTANEOUS
  Administered 2016-06-27: 2 [IU] via SUBCUTANEOUS
  Administered 2016-06-27: 1 [IU] via SUBCUTANEOUS
  Administered 2016-06-27 – 2016-06-28 (×4): 2 [IU] via SUBCUTANEOUS
  Administered 2016-06-28 (×2): 1 [IU] via SUBCUTANEOUS
  Administered 2016-06-29: 2 [IU] via SUBCUTANEOUS
  Administered 2016-06-29: 1 [IU] via SUBCUTANEOUS
  Administered 2016-06-29 – 2016-06-30 (×3): 2 [IU] via SUBCUTANEOUS
  Administered 2016-06-30: 1 [IU] via SUBCUTANEOUS
  Administered 2016-06-30: 2 [IU] via SUBCUTANEOUS
  Administered 2016-06-30: 1 [IU] via SUBCUTANEOUS
  Administered 2016-07-01: 2 [IU] via SUBCUTANEOUS
  Administered 2016-07-01: 1 [IU] via SUBCUTANEOUS
  Administered 2016-07-01 – 2016-07-02 (×7): 2 [IU] via SUBCUTANEOUS
  Administered 2016-07-02 – 2016-07-03 (×6): 3 [IU] via SUBCUTANEOUS
  Administered 2016-07-03: 2 [IU] via SUBCUTANEOUS
  Administered 2016-07-03 – 2016-07-04 (×2): 3 [IU] via SUBCUTANEOUS
  Administered 2016-07-04 (×2): 2 [IU] via SUBCUTANEOUS
  Administered 2016-07-04 – 2016-07-05 (×5): 3 [IU] via SUBCUTANEOUS
  Administered 2016-07-05: 2 [IU] via SUBCUTANEOUS
  Administered 2016-07-05 (×3): 3 [IU] via SUBCUTANEOUS
  Administered 2016-07-06 (×2): 1 [IU] via SUBCUTANEOUS
  Administered 2016-07-06 (×2): 2 [IU] via SUBCUTANEOUS
  Administered 2016-07-06: 3 [IU] via SUBCUTANEOUS
  Administered 2016-07-06: 1 [IU] via SUBCUTANEOUS
  Administered 2016-07-07: 3 [IU] via SUBCUTANEOUS
  Administered 2016-07-07 (×2): 2 [IU] via SUBCUTANEOUS
  Administered 2016-07-07: 1 [IU] via SUBCUTANEOUS
  Administered 2016-07-07: 3 [IU] via SUBCUTANEOUS
  Administered 2016-07-07: 2 [IU] via SUBCUTANEOUS
  Administered 2016-07-08: 3 [IU] via SUBCUTANEOUS
  Administered 2016-07-08 (×3): 2 [IU] via SUBCUTANEOUS
  Administered 2016-07-08: 5 [IU] via SUBCUTANEOUS
  Administered 2016-07-09 (×6): 1 [IU] via SUBCUTANEOUS
  Administered 2016-07-10: 3 [IU] via SUBCUTANEOUS
  Administered 2016-07-10 (×2): 1 [IU] via SUBCUTANEOUS
  Administered 2016-07-10: 2 [IU] via SUBCUTANEOUS
  Administered 2016-07-11: 1 [IU] via SUBCUTANEOUS
  Administered 2016-07-11: 2 [IU] via SUBCUTANEOUS
  Administered 2016-07-11: 3 [IU] via SUBCUTANEOUS
  Administered 2016-07-11: 2 [IU] via SUBCUTANEOUS
  Administered 2016-07-11: 3 [IU] via SUBCUTANEOUS
  Administered 2016-07-11: 1 [IU] via SUBCUTANEOUS
  Administered 2016-07-11: 2 [IU] via SUBCUTANEOUS
  Administered 2016-07-12 – 2016-07-13 (×5): 1 [IU] via SUBCUTANEOUS
  Administered 2016-07-14: 2 [IU] via SUBCUTANEOUS
  Administered 2016-07-14: 1 [IU] via SUBCUTANEOUS
  Administered 2016-07-14 (×2): 2 [IU] via SUBCUTANEOUS
  Administered 2016-07-15 (×2): 1 [IU] via SUBCUTANEOUS
  Administered 2016-07-15: 3 [IU] via SUBCUTANEOUS
  Administered 2016-07-15 (×2): 2 [IU] via SUBCUTANEOUS
  Administered 2016-07-16: 1 [IU] via SUBCUTANEOUS
  Administered 2016-07-16 – 2016-07-17 (×6): 2 [IU] via SUBCUTANEOUS
  Administered 2016-07-17: 1 [IU] via SUBCUTANEOUS
  Administered 2016-07-17: 3 [IU] via SUBCUTANEOUS
  Administered 2016-07-17 – 2016-07-18 (×2): 1 [IU] via SUBCUTANEOUS
  Administered 2016-07-18: 2 [IU] via SUBCUTANEOUS
  Administered 2016-07-18: 1 [IU] via SUBCUTANEOUS

## 2016-06-21 MED ORDER — SODIUM CHLORIDE 0.9 % IV BOLUS (SEPSIS)
1000.0000 mL | Freq: Once | INTRAVENOUS | Status: AC
  Administered 2016-06-21: 1000 mL via INTRAVENOUS

## 2016-06-21 MED ORDER — DEXTROSE 50 % IV SOLN
INTRAVENOUS | Status: AC
  Administered 2016-06-21: 25 mL via INTRAVENOUS
  Filled 2016-06-21: qty 50

## 2016-06-21 MED ORDER — SODIUM CHLORIDE 0.9 % IV SOLN
500.0000 mg | Freq: Two times a day (BID) | INTRAVENOUS | Status: DC
  Administered 2016-06-21 – 2016-06-24 (×6): 500 mg via INTRAVENOUS
  Filled 2016-06-21 (×8): qty 0.5

## 2016-06-21 MED ORDER — FENTANYL CITRATE (PF) 100 MCG/2ML IJ SOLN
INTRAMUSCULAR | Status: AC
  Filled 2016-06-21: qty 2

## 2016-06-21 MED ORDER — SODIUM BICARBONATE 8.4 % IV SOLN
INTRAVENOUS | Status: DC
  Administered 2016-06-21 (×3): via INTRAVENOUS
  Filled 2016-06-21 (×7): qty 850

## 2016-06-21 MED ORDER — DEXTROSE-NACL 5-0.9 % IV SOLN
INTRAVENOUS | Status: DC
  Administered 2016-06-21: 22:00:00 via INTRAVENOUS

## 2016-06-21 MED ORDER — ORAL CARE MOUTH RINSE
15.0000 mL | Freq: Four times a day (QID) | OROMUCOSAL | Status: DC
  Administered 2016-06-21 – 2016-06-28 (×28): 15 mL via OROMUCOSAL

## 2016-06-21 MED ORDER — MIDAZOLAM HCL 2 MG/2ML IJ SOLN
2.0000 mg | INTRAMUSCULAR | Status: DC | PRN
  Administered 2016-06-22 – 2016-06-23 (×5): 2 mg via INTRAVENOUS
  Filled 2016-06-21 (×5): qty 2

## 2016-06-21 MED ORDER — SODIUM CHLORIDE 0.9 % IV BOLUS (SEPSIS)
2000.0000 mL | Freq: Once | INTRAVENOUS | Status: AC
  Administered 2016-06-21: 2000 mL via INTRAVENOUS

## 2016-06-21 MED ORDER — DEXTROSE 50 % IV SOLN
25.0000 mL | Freq: Once | INTRAVENOUS | Status: AC
  Administered 2016-06-21: 25 mL via INTRAVENOUS

## 2016-06-21 MED ORDER — ASPIRIN 300 MG RE SUPP
150.0000 mg | Freq: Every day | RECTAL | Status: DC
  Administered 2016-06-21 – 2016-06-30 (×7): 150 mg via RECTAL
  Filled 2016-06-21 (×10): qty 1

## 2016-06-21 MED ORDER — MEROPENEM 500 MG IV SOLR
500.0000 mg | Freq: Two times a day (BID) | INTRAVENOUS | Status: DC
  Filled 2016-06-21: qty 0.5

## 2016-06-21 MED ORDER — DEXTROSE 50 % IV SOLN
25.0000 mL | Freq: Once | INTRAVENOUS | Status: AC
  Administered 2016-06-21: 25 mL via INTRAVENOUS
  Filled 2016-06-21: qty 50

## 2016-06-21 MED ORDER — LACTULOSE ENEMA
300.0000 mL | Freq: Every day | ORAL | Status: DC
  Administered 2016-06-21: 300 mL via RECTAL
  Filled 2016-06-21 (×2): qty 300

## 2016-06-21 MED ORDER — SODIUM CHLORIDE 0.9 % IV SOLN
INTRAVENOUS | Status: DC | PRN
  Administered 2016-06-23: 10 mL/h via INTRA_ARTERIAL
  Administered 2016-06-25 – 2016-06-26 (×2): via INTRA_ARTERIAL

## 2016-06-21 MED ORDER — FAMOTIDINE IN NACL 20-0.9 MG/50ML-% IV SOLN
20.0000 mg | INTRAVENOUS | Status: DC
  Administered 2016-06-21 – 2016-07-03 (×13): 20 mg via INTRAVENOUS
  Filled 2016-06-21 (×13): qty 50

## 2016-06-21 MED ORDER — SODIUM BICARBONATE 8.4 % IV SOLN
INTRAVENOUS | Status: AC
  Filled 2016-06-21: qty 100

## 2016-06-21 MED ORDER — NOREPINEPHRINE BITARTRATE 1 MG/ML IV SOLN
2.0000 ug/min | INTRAVENOUS | Status: DC
  Administered 2016-06-21: 18 ug/min via INTRAVENOUS
  Administered 2016-06-21: 23 ug/min via INTRAVENOUS
  Filled 2016-06-21 (×3): qty 16

## 2016-06-21 MED ORDER — SODIUM BICARBONATE 8.4 % IV SOLN
100.0000 meq | Freq: Once | INTRAVENOUS | Status: AC
  Administered 2016-06-21: 100 meq via INTRAVENOUS
  Filled 2016-06-21: qty 100

## 2016-06-21 NOTE — Progress Notes (Addendum)
PHARMACY - PHYSICIAN COMMUNICATION CRITICAL VALUE ALERT - BLOOD CULTURE IDENTIFICATION (BCID)  Results for orders placed or performed during the hospital encounter of 06/20/16  Blood Culture ID Panel (Reflexed) (Collected: 06/20/2016 10:29 PM)  Result Value Ref Range   Enterococcus species NOT DETECTED NOT DETECTED   Listeria monocytogenes NOT DETECTED NOT DETECTED   Staphylococcus species DETECTED (A) NOT DETECTED   Staphylococcus aureus NOT DETECTED NOT DETECTED   Methicillin resistance NOT DETECTED NOT DETECTED   Streptococcus species NOT DETECTED NOT DETECTED   Streptococcus agalactiae NOT DETECTED NOT DETECTED   Streptococcus pneumoniae NOT DETECTED NOT DETECTED   Streptococcus pyogenes NOT DETECTED NOT DETECTED   Acinetobacter baumannii NOT DETECTED NOT DETECTED   Enterobacteriaceae species NOT DETECTED NOT DETECTED   Enterobacter cloacae complex NOT DETECTED NOT DETECTED   Escherichia coli NOT DETECTED NOT DETECTED   Klebsiella oxytoca NOT DETECTED NOT DETECTED   Klebsiella pneumoniae NOT DETECTED NOT DETECTED   Proteus species NOT DETECTED NOT DETECTED   Serratia marcescens NOT DETECTED NOT DETECTED   Haemophilus influenzae NOT DETECTED NOT DETECTED   Neisseria meningitidis NOT DETECTED NOT DETECTED   Pseudomonas aeruginosa NOT DETECTED NOT DETECTED   Candida albicans NOT DETECTED NOT DETECTED   Candida glabrata NOT DETECTED NOT DETECTED   Candida krusei NOT DETECTED NOT DETECTED   Candida parapsilosis NOT DETECTED NOT DETECTED   Candida tropicalis NOT DETECTED NOT DETECTED    Name of physician (or Provider) Contacted:  Dr. Titus Mould  Changes to prescribed antibiotics required:  Continue current regimen.   Ranika Mcniel D. Mina Marble, PharmD, BCPS Pager:  [phone] 06/21/2016, 6:42 PM

## 2016-06-21 NOTE — Progress Notes (Signed)
PULMONARY / CRITICAL CARE MEDICINE   Name: Angelica Beck MRN: [id number] DOB: May 20, 1951    ADMISSION DATE:  06/20/2016 CONSULTATION DATE:  06/20/2016  REFERRING MD:  EDP Dr Cathleen Fears   CHIEF COMPLAINT:  AMS  HISTORY OF PRESENT ILLNESS:   65 year old female with past medical history as below, which is significant for stroke with subsequent debilitation, diabetes, chronic kidney disease, chronic urinary tract infections due to incontinence, for which she takes chronic antibiotics. She lives at home with her daughter where she requires full assistance she is unable to truly ambulate and spends most of her time in a hospital bed/recliner. She relies on family members almost entirely for activities of daily living. She was in her usual state of health until about 9/23 when she developed nausea and vomiting. This persisted for 2 days until symptoms subsided, however, she continued to complain of abdominal pain. 9/26 when the patient's daughter got home from work the patient was found to be unresponsive. EMS was called and she was found hypoglycemic which was corrected, however, altered mental status did not improve. Upon arrival to the emergency department she continued to be lethargic and was found to be hypotensive with hypoxemia. Laboratory evaluation significant for potassium 7.4, bicarbonate less than 7, creatinine 6.51, BUN 95, WBC 12.3, lactic acid 13.88, pH 7.07.  SUBJECTIVE: Patient continues to require bicarb infusion for metabolic acidosis. Hypoglycemia has resolved with IVF and now patient hyperglycemic.   REVIEW OF SYSTEMS:  Unable to obtain with intubation & sedation.   VITAL SIGNS: BP (!) 107/57   Pulse (!) 106   Temp 98.1 F (36.7 C) (Oral)   Resp (!) 28   Ht '5\' 6"'$  (1.676 m)   Wt 221 lb 12.5 oz (100.6 kg)   SpO2 100%   BMI 35.80 kg/m   HEMODYNAMICS: CVP:  [18 mmHg-23 mmHg] 23 mmHg  VENTILATOR SETTINGS: Vent Mode: PRVC FiO2 (%):  [40 %] 40 % Set Rate:  [26 bmp-30 bmp]  30 bmp Vt Set:  [470 mL] 470 mL PEEP:  [5 cmH20] 5 cmH20 Plateau Pressure:  [17 cmH20-18 cmH20] 17 cmH20  INTAKE / OUTPUT: I/O last 3 completed shifts: In: 3581.7 [I.V.:3431.7; IV Piggyback:150] Out: 180 [Urine:180]  PHYSICAL EXAMINATION: General:  Obese female. Sedated. No family at bedside.  Neuro:  Sedated. Withdraws to pain in extremities. Opens eyes spontaneously but doesn't follow commands.  HEENT:  No scleral icterus or injection. ETT in place. Moist mucus membranes. Cardiovascular:  Regular rhythm. No edema. Unable to appreciate JVD. Lungs:  Clear bilaterally to auscultation. Symmetric chest rise on ventilator.  Abdomen:  Soft. Protuberant. Hypoactive BS. Musculoskeletal:  No joint deformity or effusion. No edema in left or right arms. Skin:  Warm & dry. No rash on exposed skin.   LABS:  BMET  Recent Labs Lab 06/20/16 2058 06/21/16 0136 06/21/16 0442  NA 138 141 140  K 7.4* 5.9* 5.7*  CL 95* 105 101  CO2 <7* 7* 9*  BUN 95* 87* 88*  CREATININE 6.51* 5.65* 5.55*  GLUCOSE 136* 156* 269*    Electrolytes  Recent Labs Lab 06/20/16 2058 06/21/16 0135 06/21/16 0136 06/21/16 0442  CALCIUM 8.8*  --  7.9* 7.4*  MG  --  2.6*  --  2.7*  PHOS  --  8.6*  --  8.5*    CBC  Recent Labs Lab 06/20/16 2058 06/21/16 0136 06/21/16 0442  WBC 12.3* 13.8* 14.6*  HGB 11.2* 9.1* 9.3*  HCT 37.7 30.9* 31.4*  PLT 313 236 265  Coag's  Recent Labs Lab 06/21/16 0135  INR 1.85    Sepsis Markers  Recent Labs Lab 06/20/16 2109 06/21/16 0100 06/21/16 0135  LATICACIDVEN 13.88* 10.0*  --   PROCALCITON  --   --  5.09    ABG  Recent Labs Lab 06/20/16 2339 06/21/16 0334 06/21/16 0750  PHART 7.065* 7.099* 7.180*  PCO2ART 22.6* BELOW REPORTABLE RANGE 23.5*  PO2ART 215.0* 203.0* 197*    Liver Enzymes  Recent Labs Lab 06/20/16 2058  AST 30  ALT 12*  ALKPHOS 59  BILITOT 0.9  ALBUMIN 2.9*    Cardiac Enzymes  Recent Labs Lab 06/21/16 0135  06/21/16 0442 06/21/16 0900  TROPONINI 0.04* 0.06* 0.06*    Glucose  Recent Labs Lab 06/20/16 2037 06/20/16 2331 06/21/16 0044 06/21/16 0349 06/21/16 0746  GLUCAP 157* 120* 132* 149* 254*    Imaging Ct Abdomen Pelvis Wo Contrast  Addendum Date: 06/21/2016   ADDENDUM REPORT: 06/21/2016 03:26 ADDENDUM: Acute findings discussed with and reconfirmed by Dr.Sumner, E-Link on 06/21/2016 at 3:26 am. Electronically Signed   By: Elon Alas M.D.   On: 06/21/2016 03:26   Result Date: 06/21/2016 CLINICAL DATA:  Nausea and vomiting beginning 4 days ago, abdominal pain for 2 days. Found unresponsive September 26. History of hypertension, diabetes, hyperlipidemia, small bowel obstruction. EXAM: CT ABDOMEN AND PELVIS WITHOUT CONTRAST TECHNIQUE: Multidetector CT imaging of the abdomen and pelvis was performed following the standard protocol without IV contrast. COMPARISON:  CT abdomen and pelvis May 22, 2009 FINDINGS: Respiratory motion degraded examination. Streak artifact as arms were scanned at side. Large body habitus results in overall noisy image quality. LOWER CHEST: Lung bases are clear. Heart is mildly enlarged. Severe coronary artery calcifications. HEPATOBILIARY: Punctate hepatic granulomas, otherwise unremarkable. Mildly dense gallbladder suggests sludge without CT findings of acute cholecystitis though limited by patient motion. PANCREAS: Normal. SPLEEN: Normal. ADRENALS/URINARY TRACT: Kidneys are orthotopic, demonstrating normal size and morphology. No nephrolithiasis, hydronephrosis; limited assessment for renal masses on this nonenhanced examination. The unopacified ureters are normal in course and caliber. Urinary bladder is partially distended and unremarkable. Thickened LEFT adrenal gland is unchanged, most compatible with hyperplasia. STOMACH/BOWEL: Small bowel dilated to 4.3 cm with air-fluid levels. Large bowel is normal in course and caliber. Moderate sigmoid diverticulosis.  Normal appendix. Stomach is distended with fluid. Small hiatal hernia. VASCULAR/LYMPHATIC: Aortoiliac vessels are normal in course and caliber, mild calcific atherosclerosis. REPRODUCTIVE: Calcified leiomyomata. OTHER: No intraperitoneal free fluid or free air. MUSCULOSKELETAL: Included LEFT humerus soft tissue swelling, subcutaneous fat stranding and intramuscular gas. Intra-abdominal cyst wall scarring. Grade 1 L5-S1 anterolisthesis without spondylolysis. Severe L5-S1 neural foraminal narrowing. Small fat containing ventral hernia, status post anterior abdominal wall herniorrhaphy. IMPRESSION: Respiratory motion degraded examination. Earlier versus partial small bowel obstruction, transition point in LEFT lower quadrant compatible with adhesions. LEFT humerus soft tissue swelling, intramuscular gas. Recommend direct inspection and, dedicated imaging. Electronically Signed: By: Elon Alas M.D. On: 06/21/2016 03:04   Dg Forearm Left  Result Date: 06/21/2016 CLINICAL DATA:  66 year old female with left upper extremity swelling. Evaluate for fracture. EXAM: LEFT FOREARM - 2 VIEW; LEFT HUMERUS - 2+ VIEW COMPARISON:  None. FINDINGS: There is no acute fracture or dislocation. No arthritic changes. No significant joint effusion. There is diffuse subcutaneous edema and swelling of the left upper extremity most prominent involving the medial aspect of the arm and elbow which may represent cellulitis. Clinical correlation is recommended. No definite soft tissue gas or radiopaque foreign object. IMPRESSION: No acute fracture  or dislocation. Diffuse subcutaneous soft tissue edema concerning for cellulitis. Clinical correlation is recommended. Electronically Signed   By: Anner Crete M.D.   On: 06/21/2016 05:40   Ct Head Wo Contrast  Result Date: 06/21/2016 CLINICAL DATA:  Acute onset of altered mental status. Nausea and vomiting. Patient found unresponsive. Initial encounter. EXAM: CT HEAD WITHOUT CONTRAST  TECHNIQUE: Contiguous axial images were obtained from the base of the skull through the vertex without intravenous contrast. COMPARISON:  CT of the head performed 08/02/2015 FINDINGS: Brain: No evidence of acute infarction, hemorrhage, hydrocephalus, extra-axial collection or mass lesion/mass effect. Prominence of the ventricles and sulci reflects mild cortical volume loss. Scattered periventricular and subcortical white matter change likely reflects small vessel ischemic microangiopathy. A chronic lacunar infarct is noted at the left basal ganglia. Mild cerebellar atrophy is noted. Mild chronic ischemic change is noted at the right side of the pons. The brainstem and fourth ventricle are within normal limits. The basal ganglia are unremarkable in appearance. The cerebral hemispheres demonstrate grossly normal gray-white differentiation. No mass effect or midline shift is seen. Vascular: No hyperdense vessel or unexpected calcification. Skull: There is no evidence of fracture; visualized osseous structures are unremarkable in appearance. Sinuses/Orbits: The orbits are within normal limits. The paranasal sinuses and mastoid air cells are well-aerated. Other: No significant soft tissue abnormalities are seen. IMPRESSION: 1. No acute intracranial pathology seen on CT. 2. Mild cortical volume loss and scattered small vessel ischemic microangiopathy. 3. Chronic lacunar infarct at the left basal ganglia. Mild chronic ischemic change at the right side of the pons. Electronically Signed   By: Garald Balding M.D.   On: 06/21/2016 00:52   Dg Chest Portable 1 View  Result Date: 06/21/2016 CLINICAL DATA:  Endotracheal tube and central line placement. Initial encounter. EXAM: PORTABLE CHEST 1 VIEW COMPARISON:  Chest radiograph performed 08/04/2015 FINDINGS: The patient's endotracheal tube is seen ending 4 cm above the carina. A left IJ line is noted ending about the cavoatrial junction. The lungs are hypoexpanded. Vascular  congestion is noted. Minimal bilateral atelectasis is seen. No pleural effusion or pneumothorax is identified. The cardiomediastinal silhouette is mildly enlarged. No acute osseous abnormalities are seen. IMPRESSION: 1. Endotracheal tube seen ending 4 cm above the carina. 2. Left IJ line noted ending about the cavoatrial junction. 3. Vascular congestion and mild cardiomegaly. Lungs hypoexpanded. Minimal bilateral atelectasis seen. Electronically Signed   By: Garald Balding M.D.   On: 06/21/2016 00:09   Dg Humerus Left  Result Date: 06/21/2016 CLINICAL DATA:  65 year old female with left upper extremity swelling. Evaluate for fracture. EXAM: LEFT FOREARM - 2 VIEW; LEFT HUMERUS - 2+ VIEW COMPARISON:  None. FINDINGS: There is no acute fracture or dislocation. No arthritic changes. No significant joint effusion. There is diffuse subcutaneous edema and swelling of the left upper extremity most prominent involving the medial aspect of the arm and elbow which may represent cellulitis. Clinical correlation is recommended. No definite soft tissue gas or radiopaque foreign object. IMPRESSION: No acute fracture or dislocation. Diffuse subcutaneous soft tissue edema concerning for cellulitis. Clinical correlation is recommended. Electronically Signed   By: Anner Crete M.D.   On: 06/21/2016 05:40    STUDIES:  CT Head 9/27:  No acute abnormality. Chronic small vessel ischemic changes. Chronic lacunar infarct left basal ganglia & chronic ischemia right side of pons.  CT Abd/Pelvis w/o 9/27:  Early partial SBO w/ transition point LLQ with adhesions. Left humerus soft tissue swelling & intramuscular gas.  Left Arm X-ray 9/27:  Diffuse soft tissue edema.   MICROBIOLOGY: MRSA PCR 9/27:  Negative Blood Ctx x 2 9/26 >> Urine Ctx 9/26 >> Tracheal aspirate 9/26 >>  ANTIBIOTICS: Levaquin 9/26 >> Aztreonam 9/26 >> Vancomycin 9/26 >>  SIGNIFICANT EVENTS: 9/26 - admit for presumed septic  shock  LINES/TUBES: OETT 7.5 9/26 >> L IJ CVL 9/26 >> Foley 9/26 >> PIV x1  ASSESSMENT / PLAN:  PULMONARY A: Acute Hypoxic Respiratory Failure - Unable to protect airway with acute encephalopathy.  OHS  P:   Full Vent Support SBT as Mental Status allows Intermittent CXR & ABG  CARDIOVASCULAR A:  Shock - Likely sepsis. Cortisol 45.1.  H/O HTN & HLD  P:  Continuous telemetry monitoring Vitals per unit protocol Levophed to keep MAP > 58m/Hg TTE pending  Placing arterial line for BP monitoring & ABG for acidosis  RENAL A:   Acute  On Chronic Renal Failure - Slowly improving. Oliguric.  Profound Metabolic Acidosis Hyperkalemia - Mild. Slowly improving.  Lactic Acidosis  Hyperphosphatemia - Secondary to acute renal failure.   P:   Monitoring UOP with Foley Trending electrolytes & renal function daily Replacing electrolytes as indicated Bicarb gtt @ 150cc/hr Trending LA q6hr Repeat ABG at 1700 & again in AM 9/28 Holding off on RRT at this time  GASTROINTESTINAL A:   Partial SBO H/O Ventral Hernia Repair Possible Liver Cirrhosis  P:   NPO Continuing OGT to low intermittent suction Pepcid IV daily   HEMATOLOGIC A:   Anemia - Chronic. No signs of active bleeding. Leukocytosis - Likely due to sepsis. Worsening.   P:  Trending cell counts daily w/ CBC SCDs Heparin [place] q8hr  INFECTIOUS A:   Severe Sepsis Probable UTI vs Enteric Translocation with SBO  P:   Trending Procalcitonin per algorithm Switching from Aztreonam to Merrem  Continuing Empiric Vancomycin & Levaquin Following cultures to completion   ENDOCRINE A:   Hypoglycemia - Resolved with Dextrose IVF. H/O DM Type 2 H/O Hyperthyroidism - On PTU at home. TSH 0.331 & F T4 1.33.   P:   Checking Hgb A1c SSI per Sensitive Algorithm Accu-Checks q4hr w/ MD notification parameters Holding PTU  NEUROLOGIC A:   Acute Encephalopathy - Question cirrhosis vs toxic metabolic vs  hypoxia. Sedation on Ventilator  H/O CVA - Minimal mobility.  P:   RASS goal: 0 to -1 Versed IV prn Sedation Fentanyl IV prn Sedation Lactulose Enema daily  Repeat Ammonia level AM  MUSCULOSKELETAL A: Edema Left Arm - Improved. Secondary to IV infiltration. No gas on X-ray 9/27.  P: Monitor closely.    FAMILY  - Updates: [facility]. Daughter updated by Dr. NAshok Cordiavia phone 9/27.   - Inter-disciplinary family meet or Palliative Care meeting due by:  10/2  TODAY'S SUMMARY:  65y.o. female with hx stroke and severe debilitation presenting with AMS 9/26. Presumed septic shock, however, source not yet clear. Intubated in ED. Remains in MSOF with at least a partial SBO. Continuing vasopressors. Switching from Aztreonam to Merrem and continuing other antibiotics.   I have spent an additional 45 minutes of critical care time today caring for the patient, updating patient's daughter via phone, and reviewing the patient's electronic medical record.   JSonia BallerNAshok Cordia M.D. LEn[facility] Rehabilitation Of City ViewPulmonary & Critical Care Pager:  [phone] 3pm or if no response, call [phone]/27/2017 10:57 AM

## 2016-06-21 NOTE — Progress Notes (Signed)
Pharmacy Antibiotic Note  Angelica Beck is a 65 y.o. female admitted on 06/20/2016 with sepsis 2/2 intra-abdominal source vs UTI.  Pharmacy has been consulted to switch aztreonam to meropenem, also consulted for vancomycin and levofloxacin. Patient was hypothermic, WBC trending up to 14.6, LA 13.88>10. Patient has acute on chronic kidney disease.   Plan: - Discontinue aztreonam - Continue vancomycin 1gm IV Q48H - Start meropenem 500 mg IV Q12H - Recommend d/c levofloxacin  - Monitor renal fxn, clinical progress, vanc trough as indicated  Height: '5\' 6"'$  (167.6 cm) Weight: 221 lb 12.5 oz (100.6 kg) IBW/kg (Calculated) : 59.3  Temp (24hrs), Avg:97.8 F (36.6 C), Min:94.6 F (34.8 C), Max:99.9 F (37.7 C)   Recent Labs Lab 06/20/16 2058 06/20/16 2109 06/21/16 0100 06/21/16 0136 06/21/16 0442  WBC 12.3*  --   --  13.8* 14.6*  CREATININE 6.51*  --   --  5.65* 5.55*  LATICACIDVEN  --  13.88* 10.0*  --   --     Estimated Creatinine Clearance: 12.4 mL/min (by C-G formula based on SCr of 5.55 mg/dL (H)).    Allergies  Allergen Reactions  . Lactose Intolerance (Gi) Diarrhea and Nausea And Vomiting  . Penicillins Swelling    Facial swelling    Antimicrobials this admission:  Vanc 9/26 >> Azactam 9/26 >>9/27 LVQ 9/26 >> Meropenem 9/27>>  Dose adjustments this admission:  N/A  Microbiology results:  9/27 MRSA PCR: neg 9/26 BCx: sent 9/26 UCx: sent   Gwenlyn Perking, PharmD PGY1 Pharmacy Resident Pager: [phone] 06/21/2016 11:43 AM

## 2016-06-21 NOTE — Progress Notes (Addendum)
[place] Progress Note Patient Name: Angelica Beck DOB: 02-03-1951 MRN: [id number]   Date of Service  06/21/2016  HPI/Events of Note  CT Scan reveals multiple findings: 1. Early vs partial SBO and 2. Possible necrotizing fascitis LUE. Admitting team informs me that she had an IV infiltrate in the LUE. Patient already has an OGT to LIS and is on broad spectrum antibiotic Rx.   eICU Interventions  Will order:  1. Xray of L humoris and L forearm to r/o necrotizing fascitis.  2. Continue OGT to LIS.  Conservative management for now. Will consult surgery in AM.     Intervention Category Intermediate Interventions: Diagnostic test evaluation  Angelica Beck [name] 06/21/2016, 3:38 AM

## 2016-06-21 NOTE — Progress Notes (Signed)
CRITICAL VALUE ALERT  Critical value received:  pH= 7.18, Pco2= 23, p02=197, HCO3= 8.4  Date of notification:  06/21/16  Time of notification:  7121  Critical value read back:Yes.    Nurse who received alert:  Allegra Grana, RN  MD notified (1st page):  Dr Ashok Cordia  Time of first page:  [phone]  MD notified (2nd page):  Time of second page:  Responding MD:  Dr Ashok Cordia  Time MD responded:  [phone]

## 2016-06-21 NOTE — Progress Notes (Signed)
Inpatient Diabetes Program Recommendations  AACE/ADA: New Consensus Statement on Inpatient Glycemic Control (2015)  Target Ranges:  Prepandial:   less than 140 mg/dL      Peak postprandial:   less than 180 mg/dL (1-2 hours)      Critically ill patients:  140 - 180 mg/dL   Lab Results  Component Value Date   GLUCAP 266 (H) 06/21/2016   HGBA1C 6.3 (H) 08/02/2015    Review of Glycemic Control:  Results for Angelica Beck, Angelica Beck (MRN [id number]) as of 06/21/2016 12:09  Ref. Range 06/21/2016 00:44 06/21/2016 03:49 06/21/2016 07:46 06/21/2016 11:23  Glucose-Capillary Latest Ref Range: 65 - 99 mg/dL 132 (H) 149 (H) 254 (H) 266 (H)   Diabetes history: Type 2 diabetes Outpatient Diabetes medications: Lantus 10 units q AM and 20 units q PM Current orders for Inpatient glycemic control: Novolog sensitive q 4 hours  Inpatient Diabetes Program Recommendations:    Please consider ICU glycemic control order set phase 2-IV insulin.  Discussed with RN and paged Dr. Ashok Cordia to discuss.   Thanks, Adah Perl, RN, BC-ADM Inpatient Diabetes Coordinator Pager [phone] (8a-5p)

## 2016-06-21 NOTE — Progress Notes (Signed)
[place] Progress Note Patient Name: Angelica Beck DOB: 01-16-1951 MRN: [id number]   Date of Service  06/21/2016  HPI/Events of Note  ABG on 40%/PRVC 30/TV 470/P 5 = 7.09/too low to measure/203/5.3. Her pH is not high enough to back off on the PRVC rate at this time.   eICU Interventions  Will order: 1. NaHCO3 100 meq IV now.  2. Increase NaHCO3 IV infusion to 125 mL/hour.  3. ABG at 7 AM.     Intervention Category Major Interventions: Acid-Base disturbance - evaluation and management;Respiratory failure - evaluation and management  Angelica Beck 06/21/2016, 3:52 AM/

## 2016-06-21 NOTE — Progress Notes (Signed)
[place] Progress Note Patient Name: Angelica Beck DOB: 16-Sep-1951 MRN: [id number]   Date of Service  06/21/2016  HPI/Events of Note  Multiple issues:  1. Temp =  94.6 rectally and 2. Oliguria.   eICU Interventions  Will order: 1. [employer] [facility]. 2. Monitor CVP. Further fluid therapy directed by CVP findings.     Intervention Category Intermediate Interventions: Oliguria - evaluation and management;Other:  Lysle Dingwall 06/21/2016, 4:34 AM

## 2016-06-21 NOTE — Progress Notes (Signed)
[place] Progress Note Patient Name: Angelica Beck DOB: 05/24/1951 MRN: [id number]   Date of Service  06/21/2016  HPI/Events of Note  Glu low  eICU Interventions  Add d5ns     Intervention Category Major Interventions: Hyperglycemia - active titration of insulin therapy  Raylene Miyamoto. 06/21/2016, 10:10 PM

## 2016-06-21 NOTE — Progress Notes (Signed)
CRITICAL VALUE ALERT  Critical value received:  PH= 7.23, Pc02=21.9, pO2= 223, HCO3= 8.8  Date of notification:  06/21/16  Time of notification:  1610  Critical value read back:Yes.    Nurse who received alert:  Allegra Grana, RN  MD notified (1st page):  Dr Ashok Cordia  Time of first page:  1705  MD notified (2nd page):  Time of second page:  Responding MD:  Dr Ashok Cordia  Time MD responded:  [phone]

## 2016-06-21 NOTE — Progress Notes (Signed)
  Echocardiogram 2D Echocardiogram has been performed.  Darlina Sicilian M 06/21/2016, 12:36 PM

## 2016-06-21 NOTE — Progress Notes (Signed)
Pt's BS 57 D50  60m IV given, BS rechecked 93. Pt BS dropped again 63. D50 232mIV given pt BS 87. Informed Dr FeAchille Rich046mr ordered.

## 2016-06-21 NOTE — Progress Notes (Signed)
[place] Progress Note Patient Name: Angelica Beck DOB: 1951-05-06 MRN: [id number]   Date of Service  06/21/2016  HPI/Events of Note  Multiple issues: 1. Lactic Acid = 10.0, 2. Troponin = 0.04 and 3. ? Need for another ABG.  eICU Interventions  Will order: 1. 0.9 NaCl 1 liter IV over 1 hour now.  2. ASA suppository 150 mg PR now and Q day.  3. ABG at 4 AM.     Intervention Category Major Interventions: Adrenal insufficiency - evaluation and management  Ramsey Midgett [name] 06/21/2016, 3:18 AM

## 2016-06-21 NOTE — Progress Notes (Signed)
PCCM INTERVAL PROGRESS NOTE  CT head > No acute intracranial pathology seen on CT. Mild cortical volume loss and scattered small vessel ischemic microangiopathy. Chronic lacunar infarct at the left basal ganglia. Mild chronic ischemic change at the right side of the pons.  CT abd/pelvis > Earlier versus partial small bowel obstruction, transition point in, LEFT lower quadrant compatible with adhesions. LEFT humerus soft tissue swelling, intramuscular gas. Recommend direct inspection and, dedicated imaging.  Plan: 1) Partial SBO > Gastric tube to Low intermittent suction already in place. NPO. Consult general surgery in AM  2) L humerus soft tissue swelling with intramuscular gas > compatible with left upper extremity IV extravasation. Saline and antibiotics only infusing at that time, no obvious injury or cellulitic appearance on exam. Monitor closely, LUE xray. Surgical consult in AM.  3) renal function improving with IV hydration, continue per [facility] order.   Georgann Housekeeper, AGACNP-BC [facility] Pulmonology/Critical Care Pager [phone] or [phone]  06/21/2016 3:39 AM

## 2016-06-21 NOTE — Progress Notes (Signed)
CRITICAL VALUE ALERT  Critical value received:  Lactic acid= 11.3  Date of notification:  06/21/16  Time of notification:  6222  Critical value read back:Yes.    Nurse who received alert:  Allegra Grana, RN  MD notified (1st page):  Dr Ashok Cordia  Time of first page:  1315  MD notified (2nd page):  Time of second page:  Responding MD:  Dr Ashok Cordia  Time MD responded:  9798  Verbal order to give 500cc bolus from pts infusing bicarb drip.

## 2016-06-21 NOTE — Progress Notes (Signed)
CRITICAL VALUE ALERT  Critical value received:  Lactic acid 15.9  Date of notification:  06/21/16  Time of notification:  2197  Critical value read back:Yes.    Nurse who received alert:  Allegra Grana, RN  MD notified (1st page):  Dr Ashok Cordia  Time of first page:  1533  MD notified (2nd page):  Time of second page:  Responding MD:  Dr Ashok Cordia  Time MD responded:  [phone]

## 2016-06-21 NOTE — Progress Notes (Signed)
CRITICAL VALUE ALERT  Critical value received:  Lactic 10 and troponin 0.04  Date of notification: 06/21/16  Time of notification: 0230  Critical value read back: yes  Nurse who received alert: Brunetta Genera Deboraha Sprang RN  MD notified (1st page):  Dr. Corinna Lines  Time of first page:  0230

## 2016-06-21 NOTE — Progress Notes (Signed)
RT attempted to place arterial catheter maximum amount of times.  All times were unsuccessful.  MD aware.

## 2016-06-22 ENCOUNTER — Inpatient Hospital Stay (HOSPITAL_COMMUNITY): Payer: Medicare Other

## 2016-06-22 DIAGNOSIS — N189 Chronic kidney disease, unspecified: Secondary | ICD-10-CM

## 2016-06-22 DIAGNOSIS — N179 Acute kidney failure, unspecified: Secondary | ICD-10-CM

## 2016-06-22 DIAGNOSIS — E872 Acidosis: Secondary | ICD-10-CM

## 2016-06-22 DIAGNOSIS — G9340 Encephalopathy, unspecified: Secondary | ICD-10-CM

## 2016-06-22 DIAGNOSIS — K5669 Other intestinal obstruction: Secondary | ICD-10-CM

## 2016-06-22 DIAGNOSIS — J9601 Acute respiratory failure with hypoxia: Secondary | ICD-10-CM

## 2016-06-22 LAB — AMMONIA: AMMONIA: 72 umol/L — AB (ref 9–35)

## 2016-06-22 LAB — COMPREHENSIVE METABOLIC PANEL
ALBUMIN: 2.2 g/dL — AB (ref 3.5–5.0)
ALT: 15 U/L (ref 14–54)
AST: 40 U/L (ref 15–41)
Alkaline Phosphatase: 61 U/L (ref 38–126)
Anion gap: 37 — ABNORMAL HIGH (ref 5–15)
BUN: 91 mg/dL — AB (ref 6–20)
CHLORIDE: 95 mmol/L — AB (ref 101–111)
CO2: 10 mmol/L — ABNORMAL LOW (ref 22–32)
Calcium: 6.8 mg/dL — ABNORMAL LOW (ref 8.9–10.3)
Creatinine, Ser: 6.18 mg/dL — ABNORMAL HIGH (ref 0.44–1.00)
GFR calc Af Amer: 8 mL/min — ABNORMAL LOW (ref >60)
GFR, EST NON AFRICAN AMERICAN: 6 mL/min — AB (ref >60)
GLUCOSE: 58 mg/dL — AB (ref 65–99)
POTASSIUM: 5.1 mmol/L (ref 3.5–5.1)
Sodium: 142 mmol/L (ref 135–145)
Total Bilirubin: 0.7 mg/dL (ref 0.3–1.2)
Total Protein: 5.2 g/dL — ABNORMAL LOW (ref 6.5–8.1)

## 2016-06-22 LAB — BLOOD GAS, ARTERIAL
ACID-BASE DEFICIT: 17 mmol/L — AB (ref 0.0–2.0)
BICARBONATE: 8.8 mmol/L — AB (ref 20.0–28.0)
DRAWN BY: 449561
FIO2: 40
MECHVT: 470 mL
O2 SAT: 99 %
PCO2 ART: 19.8 mmHg — AB (ref 32.0–48.0)
PEEP: 5 cmH2O
PH ART: 7.269 — AB (ref 7.350–7.450)
PO2 ART: 198 mmHg — AB (ref 83.0–108.0)
Patient temperature: 98.6
RATE: 30 resp/min

## 2016-06-22 LAB — LACTIC ACID, PLASMA
LACTIC ACID, VENOUS: 18 mmol/L — AB (ref 0.5–1.9)
LACTIC ACID, VENOUS: 17 mmol/L — AB (ref 0.5–1.9)
LACTIC ACID, VENOUS: 18.4 mmol/L — AB (ref 0.5–1.9)

## 2016-06-22 LAB — CBC WITH DIFFERENTIAL/PLATELET
Basophils Absolute: 0 10*3/uL (ref 0.0–0.1)
Basophils Relative: 0 %
EOS PCT: 0 %
Eosinophils Absolute: 0 10*3/uL (ref 0.0–0.7)
HEMATOCRIT: 28.4 % — AB (ref 36.0–46.0)
HEMOGLOBIN: 8.9 g/dL — AB (ref 12.0–15.0)
LYMPHS ABS: 1.8 10*3/uL (ref 0.7–4.0)
Lymphocytes Relative: 12 %
MCH: 27.6 pg (ref 26.0–34.0)
MCHC: 31.3 g/dL (ref 30.0–36.0)
MCV: 87.9 fL (ref 78.0–100.0)
MONOS PCT: 15 %
Monocytes Absolute: 2.3 10*3/uL — ABNORMAL HIGH (ref 0.1–1.0)
Neutro Abs: 10.9 10*3/uL — ABNORMAL HIGH (ref 1.7–7.7)
Neutrophils Relative %: 73 %
Platelets: 256 10*3/uL (ref 150–400)
RBC: 3.23 MIL/uL — AB (ref 3.87–5.11)
RDW: 15.5 % (ref 11.5–15.5)
WBC Morphology: INCREASED
WBC: 15 10*3/uL — AB (ref 4.0–10.5)

## 2016-06-22 LAB — GLUCOSE, CAPILLARY
GLUCOSE-CAPILLARY: 53 mg/dL — AB (ref 65–99)
GLUCOSE-CAPILLARY: 135 mg/dL — AB (ref 65–99)
Glucose-Capillary: 119 mg/dL — ABNORMAL HIGH (ref 65–99)
Glucose-Capillary: 151 mg/dL — ABNORMAL HIGH (ref 65–99)
Glucose-Capillary: 189 mg/dL — ABNORMAL HIGH (ref 65–99)
Glucose-Capillary: 99 mg/dL (ref 65–99)
Glucose-Capillary: 99 mg/dL (ref 65–99)

## 2016-06-22 LAB — PHOSPHORUS: Phosphorus: 8.1 mg/dL — ABNORMAL HIGH (ref 2.5–4.6)

## 2016-06-22 LAB — MAGNESIUM: Magnesium: 2.8 mg/dL — ABNORMAL HIGH (ref 1.7–2.4)

## 2016-06-22 LAB — HEMOGLOBIN A1C
HEMOGLOBIN A1C: 5.8 % — AB (ref 4.8–5.6)
Mean Plasma Glucose: 120 mg/dL

## 2016-06-22 MED ORDER — DEXTROSE 50 % IV SOLN
25.0000 mL | Freq: Once | INTRAVENOUS | Status: AC
  Administered 2016-06-22: 25 mL via INTRAVENOUS

## 2016-06-22 MED ORDER — DEXTROSE 5 % IV SOLN
INTRAVENOUS | Status: DC
  Administered 2016-06-22 – 2016-06-24 (×7): via INTRAVENOUS
  Filled 2016-06-22 (×12): qty 150

## 2016-06-22 MED ORDER — SODIUM CHLORIDE 0.9 % IV BOLUS (SEPSIS)
500.0000 mL | Freq: Once | INTRAVENOUS | Status: AC
  Administered 2016-06-22: 500 mL via INTRAVENOUS

## 2016-06-22 MED ORDER — DEXTROSE 50 % IV SOLN
INTRAVENOUS | Status: AC
Start: 2016-06-22 — End: 2016-06-22
  Filled 2016-06-22: qty 50

## 2016-06-22 NOTE — Progress Notes (Signed)
[facility] Progress Note Patient Name: Marit Goodwill DOB: [birth year] MRN: [id number]   Date of Service  06/22/2016  HPI/Events of Note  LA remains very elevated. BPs marginal. NE @ 18 mcg/min  eICU Interventions  NS bolus 500 cc     Intervention Category Major Interventions: Acid-Base disturbance - evaluation and management;Hypotension - evaluation and management  [name] 06/22/2016, 1:15 AM

## 2016-06-22 NOTE — Progress Notes (Addendum)
PULMONARY / CRITICAL CARE MEDICINE   Name: Angelica Beck MRN: [id number] DOB: May 20, 1951    ADMISSION DATE:  06/20/2016 CONSULTATION DATE:  06/20/2016  REFERRING MD:  EDP Dr Cathleen Fears   CHIEF COMPLAINT:  AMS  HISTORY OF PRESENT ILLNESS:   65 year old female with past medical history as below, which is significant for stroke with subsequent debilitation, diabetes, chronic kidney disease, chronic urinary tract infections due to incontinence, for which she takes chronic antibiotics. She lives at home with her daughter where she requires full assistance she is unable to truly ambulate and spends most of her time in a hospital bed/recliner. She relies on family members almost entirely for activities of daily living. She was in her usual state of health until about 9/23 when she developed nausea and vomiting. This persisted for 2 days until symptoms subsided, however, she continued to complain of abdominal pain. 9/26 when the patient's daughter got home from work the patient was found to be unresponsive. EMS was called and she was found hypoglycemic which was corrected, however, altered mental status did not improve. Upon arrival to the emergency department she continued to be lethargic and was found to be hypotensive with hypoxemia. Laboratory evaluation significant for potassium 7.4, bicarbonate less than 7, creatinine 6.51, BUN 95, WBC 12.3, lactic acid 13.88, pH 7.07.  SUBJECTIVE: Patient again persistently hypoglycemic overnight and dextrose was added to bicarb infusion. Patient with persistently rising lactic acidosis. Vasopressor requirement slowly improving.  REVIEW OF SYSTEMS:  Unable to obtain with intubation & sedation.   VITAL SIGNS: BP (!) 130/51   Pulse 98   Temp 98.5 F (36.9 C) (Oral)   Resp (!) 33   Ht '5\' 6"'$  (1.676 m)   Wt 241 lb 2.9 oz (109.4 kg)   SpO2 100%   BMI 38.93 kg/m   HEMODYNAMICS: CVP:  [18 mmHg-23 mmHg] 23 mmHg  VENTILATOR SETTINGS: Vent Mode: PRVC FiO2  (%):  [40 %] 40 % Set Rate:  [30 bmp] 30 bmp Vt Set:  [470 mL] 470 mL PEEP:  [5 cmH20] 5 cmH20 Plateau Pressure:  [7 HCW23-76 cmH20] 21 cmH20  INTAKE / OUTPUT: I/O last 3 completed shifts: In: 7874.5 [I.V.:7624.5; IV Piggyback:250] Out: 800 [Urine:600; Emesis/NG output:200]  PHYSICAL EXAMINATION: General:  Obese female. No family at bedside.  Neuro: Eyes opening spontaneously. Not following commands. Responds to pain in extremities. Questionably tracks to voice.  HEENT:  No scleral icterus or injection. ETT in place. Moist mucus membranes. Cardiovascular:  Regular rhythm and rate. No edema. Unable to appreciate JVD. Lungs:  Clear bilaterally to auscultation. Symmetric chest rise on ventilator.  Abdomen:  Soft. Protuberant. Severely hypoactive BS. Musculoskeletal:  No joint deformity or effusion. No edema in left or right arms. Skin:  Warm & dry. No rash on exposed skin. No erythema in left arm.   LABS:  BMET  Recent Labs Lab 06/21/16 0442 06/21/16 1735 06/22/16 0333  NA 140 142 142  K 5.7* 5.2* 5.1  CL 101 98* 95*  CO2 9* 10* 10*  BUN 88* 92* 91*  CREATININE 5.55* 5.90* 6.18*  GLUCOSE 269* 101* 58*    Electrolytes  Recent Labs Lab 06/21/16 0135  06/21/16 0442 06/21/16 1735 06/22/16 0333  CALCIUM  --   < > 7.4* 7.2* 6.8*  MG 2.6*  --  2.7*  --  2.8*  PHOS 8.6*  --  8.5*  --  8.1*  < > = values in this interval not displayed.  CBC  Recent Labs Lab 06/21/16  0136 06/21/16 0442 06/22/16 0333  WBC 13.8* 14.6* 15.0*  HGB 9.1* 9.3* 8.9*  HCT 30.9* 31.4* 28.4*  PLT 236 265 256    Coag's  Recent Labs Lab 06/21/16 0135  INR 1.85    Sepsis Markers  Recent Labs Lab 06/21/16 0135 06/21/16 1119 06/21/16 1650 06/21/16 2319  LATICACIDVEN  --  11.3* 15.9* 18.0*  PROCALCITON 5.09  --   --   --     ABG  Recent Labs Lab 06/21/16 0750 06/21/16 1650 06/22/16 0434  PHART 7.180* 7.230* 7.269*  PCO2ART 23.5* 21.9* 19.8*  PO2ART 197* 223* 198*     Liver Enzymes  Recent Labs Lab 06/20/16 2058 06/22/16 0333  AST 30 40  ALT 12* 15  ALKPHOS 59 61  BILITOT 0.9 0.7  ALBUMIN 2.9* 2.2*    Cardiac Enzymes  Recent Labs Lab 06/21/16 0135 06/21/16 0442 06/21/16 0900  TROPONINI 0.04* 0.06* 0.06*    Glucose  Recent Labs Lab 06/21/16 2134 06/21/16 2208 06/21/16 2322 06/22/16 0327 06/22/16 0411 06/22/16 0743  GLUCAP 63* 87 77 53* 99 99    Imaging Dg Forearm Left  Result Date: 06/22/2016 CLINICAL DATA:  Assess extent of subcutaneous emphysema. Initial encounter. EXAM: LEFT FOREARM - 2 VIEW COMPARISON:  Left forearm radiographs performed 06/21/2016 FINDINGS: There is no evidence of fracture or dislocation. The radius and ulna appear grossly intact. The elbow joint is incompletely assessed, but appears grossly unremarkable. Vague soft tissue lucency is seen tracking along the medial aspect of the elbow and proximal to mid forearm. Given findings on recent CT, this likely reflects a combination of soft tissue air and soft tissue edema, and raises concern for necrotizing fasciitis. IMPRESSION: 1. No evidence of fracture or dislocation. 2. Vague soft tissue lucency tracking along the medial aspect of the elbow and proximal to mid forearm. Given findings on recent CT, this likely reflects a combination of soft tissue air and soft tissue edema, and raises concern for necrotizing fasciitis. Would correlate clinically. Electronically Signed   By: Garald Balding M.D.   On: 06/22/2016 02:59   Dg Abd Portable 1v  Result Date: 06/22/2016 CLINICAL DATA:  Follow up partial small bowel obstruction. Subsequent encounter. EXAM: PORTABLE ABDOMEN - 1 VIEW COMPARISON:  CT of the abdomen and pelvis from 06/21/2016 FINDINGS: The patient's enteric tube is seen ending overlying the antrum of the stomach. There is dilatation of small-bowel loops to 4.8 cm in diameter, concerning for persistent small bowel obstruction. A small amount of fecalized small  bowel is noted at the right lower quadrant. No free intra-abdominal air is identified, though evaluation for free air is limited on supine views. The visualized osseous structures are within normal limits; the sacroiliac joints are unremarkable in appearance. A calcified uterine fibroid is noted. IMPRESSION: 1. Enteric tube noted ending overlying the antrum of the stomach. 2. Dilatation of small-bowel loops to 4.8 cm in diameter, concerning for persistent small bowel obstruction. No free intra-abdominal air seen. Electronically Signed   By: Garald Balding M.D.   On: 06/22/2016 03:06   Dg Humerus Left  Addendum Date: 06/22/2016   ADDENDUM REPORT: 06/22/2016 02:58 ADDENDUM: Would correlate clinically as to whether there is crepitus along the left arm. Diffuse vague lucency along the left arm and forearm is worsened from the prior study, but could still reflect diffuse edema rather than soft tissue air. Electronically Signed   By: Garald Balding M.D.   On: 06/22/2016 02:58   Result Date: 06/22/2016 CLINICAL DATA:  Acute onset of subcutaneous emphysema. Initial encounter. EXAM: LEFT HUMERUS - 2+ VIEW COMPARISON:  Left humerus radiographs performed 06/21/2016 FINDINGS: There is no evidence of fracture or dislocation. The left humerus appears intact. The left humeral head remains seated at the glenoid. Mild degenerative change is noted at the left acromioclavicular joint. The left elbow joint is incompletely assessed, but appears grossly unremarkable. No definite soft tissue abnormalities are characterized on radiograph. Prominent soft tissue air is seen tracking along the medial aspect of the left upper arm and proximal forearm. IMPRESSION: 1. No evidence of fracture or dislocation. 2. Prominent soft tissue air tracking along the medial aspect of the upper left upper arm and proximal forearm. Electronically Signed: By: Garald Balding M.D. On: 06/22/2016 02:54    STUDIES:  CT Head 9/27:  No acute abnormality.  Chronic small vessel ischemic changes. Chronic lacunar infarct left basal ganglia & chronic ischemia right side of pons.  CT Abd/Pelvis w/o 9/27:  Early partial SBO w/ transition point LLQ with adhesions. Left humerus soft tissue swelling & intramuscular gas.  Left Arm X-ray 9/27:  Diffuse soft tissue edema.  TTE 9/27:  LV w/ EF 60-65%. Grade 1 Diastolic dysfunction. No regional wall motion abnormalities. LA & RA normal in size. RV normal in size & function. Mild AS w/o AR. Mild MR w/o MS.   MICROBIOLOGY: MRSA PCR 9/27:  Negative Blood Ctx x 2 9/26 >> Urine Ctx 9/26 >> Tracheal aspirate 9/26 >>  ANTIBIOTICS: Levaquin 9/26 - 9/28 Aztreonam 9/26 - 9/27 Vancomycin 9/26 >> Merrem 9/27 >>  SIGNIFICANT EVENTS: 9/26 - admit for presumed septic shock  LINES/TUBES: OETT 7.5 9/26 >> L IJ CVL 9/26 >> Foley 9/26 >> PIV x1  ASSESSMENT / PLAN:  PULMONARY A: Acute Hypoxic Respiratory Failure - Unable to protect airway with acute encephalopathy.  OHS  P:   Full Vent Support SBT as Mental Status allows Intermittent CXR & ABG  CARDIOVASCULAR A:  Shock - Likely sepsis. Cortisol 45.1.  H/O HTN & HLD  P:  Continuous telemetry monitoring Vitals per unit protocol Levophed to keep MAP > 79m/Hg  RENAL A:   Acute On Chronic Renal Failure Stage IV - Worsening. Oliguric but slight improvement in UOP.  Profound Metabolic Acidosis Hyperkalemia - Resolved.  Lactic Acidosis - Worsening. Likely due to poor renal clearance. Hyperphosphatemia - Secondary to acute renal failure.   P:   Monitoring UOP with Foley Trending electrolytes & renal function daily Replacing electrolytes as indicated Bicarb gtt @ 150cc/hr Repeat ABG in AM Repeat LA in AM Holding off on RRT at this time  GASTROINTESTINAL A:   Partial SBO H/O Ventral Hernia Repair Possible Liver Cirrhosis  P:   NPO Continuing OGT to low intermittent suction Pepcid IV daily   HEMATOLOGIC A:   Anemia - Chronic. No signs  of active bleeding. Hgb stable. Leukocytosis - Likely due to sepsis. Worsening slowly.  P:  Trending cell counts daily w/ CBC SCDs Heparin [place] q8hr  INFECTIOUS A:   Severe Sepsis Staphylococcus Bacteremia - ID pending. Probable UTI vs Enteric Translocation with SBO  P:   Trending Procalcitonin per algorithm Empiric Vancomycin, Levaquin Day, & Merrem Day #2 Discontinuing Levaquin Following cultures to completion   ENDOCRINE A:   Hypoglycemia - Resolved with Dextrose IVF. H/O DM Type 2 - A1c 5.8. H/O Hyperthyroidism - On PTU at home. TSH 0.331 & F T4 1.33.   P:   SSI per Sensitive Algorithm Accu-Checks q4hr w/ MD notification parameters Holding  PTU 3amp Bicarb w/ D5 @ 150cc/hr  NEUROLOGIC A:   Acute Encephalopathy - Question hepatic vs toxic metabolic vs hypoxia. No improvement in Lactulose Enema despite improved Ammonia. Sedation on Ventilator  H/O CVA - Minimal mobility.  P:   RASS goal: 0 to -1 Versed IV prn Sedation Fentanyl IV prn Sedation D/C Lactulose Enema  Repeat NH4 in AM  MUSCULOSKELETAL A: Edema Left Arm - Improved. Secondary to IV infiltration. No gas on X-ray 9/27.  P: Monitor closely.    FAMILY  - Updates: Daughter updated by [facility] & MR on admission this morning. Daughter updated by Dr. Ashok Cordia via phone 9/27.   - Inter-disciplinary family meet or Palliative Care meeting due by:  10/2  TODAY'S SUMMARY:  65 y.o. female with hx stroke and severe debilitation presenting with AMS 9/26. Presumed septic shock likely from bacteremia and probable UTI. Cultures pending. Continuing OGT to low intermittent suction. Electrolytes stable & UOP marginal. Continuing to hold on renal replacement therapy at this time. Lactic acidosis likely due to poor clearance in setting of acute renal failure and possible underlying liver cirrhosis.   I have spent a total of 36 minutes of critical care time today caring for the patient and reviewing the patient's electronic  medical record.   Sonia Baller Ashok Cordia, M.D. [facility] Pulmonary & Critical Care Pager:  [phone] After 3pm or if no response, call [phone] 06/22/2016 7:55 AM

## 2016-06-22 NOTE — Progress Notes (Signed)
[facility] Progress Note Patient Name: Angelica Beck DOB: Mar 14, 1951 MRN: [id number]   Date of Service  06/22/2016  HPI/Events of Note  Recurrent hypoglycemia  eICU Interventions  HCO3 gtt changed to D5W + HCO3     Intervention Category Intermediate Interventions: Other:  [name] 06/22/2016, 3:54 AM

## 2016-06-22 NOTE — Progress Notes (Signed)
CRITICAL VALUE ALERT  Critical value received:  Lactic acid 18.4  Date of notification:  06/22/16  Time of notification:  1207  Critical value read back:Yes.    Nurse who received alert:  Allegra Grana, RN  MD notified (1st page):  Dr Ashok Cordia  Time of first page:  1207  MD notified (2nd page):  Time of second page:  Responding MD: Dr Ashok Cordia  Time MD responded:  1207

## 2016-06-22 NOTE — Progress Notes (Signed)
CRITICAL VALUE ALERT  Critical value received:  Lactic acid 17.0  Date of notification:  06/22/16  Time of notification: 1000  Critical value read back:Yes.    Nurse who received alert:  Allegra Grana, RN  MD notified (1st page):  Dr Ashok Cordia  Time of first page:  1000  MD notified (2nd page):  Time of second page:  Responding MD:  Dr Ashok Cordia  Time MD responded: 1000

## 2016-06-22 NOTE — Progress Notes (Signed)
CRITICAL VALUE ALERT  Critical value received: Lactic acid 18  Date of notification:  06/22/16  Time of notification:  0106  Critical value read back: yes  Nurse who received alert: Deboraha Sprang, RN  MD notified (1st page):  Dr. Alva Garnet  Time of first page:  0111

## 2016-06-23 ENCOUNTER — Encounter: Payer: Self-pay | Admitting: Pulmonary Disease

## 2016-06-23 LAB — CBC WITH DIFFERENTIAL/PLATELET
Basophils Absolute: 0 10*3/uL (ref 0.0–0.1)
Basophils Relative: 0 %
EOS PCT: 0 %
Eosinophils Absolute: 0 10*3/uL (ref 0.0–0.7)
HCT: 26.1 % — ABNORMAL LOW (ref 36.0–46.0)
Hemoglobin: 8.5 g/dL — ABNORMAL LOW (ref 12.0–15.0)
LYMPHS ABS: 1.4 10*3/uL (ref 0.7–4.0)
Lymphocytes Relative: 9 %
MCH: 27.7 pg (ref 26.0–34.0)
MCHC: 32.6 g/dL (ref 30.0–36.0)
MCV: 85 fL (ref 78.0–100.0)
MONO ABS: 2.1 10*3/uL — AB (ref 0.1–1.0)
Monocytes Relative: 13 %
Neutro Abs: 12.4 10*3/uL — ABNORMAL HIGH (ref 1.7–7.7)
Neutrophils Relative %: 78 %
PLATELETS: 224 10*3/uL (ref 150–400)
RBC: 3.07 MIL/uL — AB (ref 3.87–5.11)
RDW: 15.6 % — AB (ref 11.5–15.5)
WBC Morphology: INCREASED
WBC: 15.9 10*3/uL — AB (ref 4.0–10.5)

## 2016-06-23 LAB — RENAL FUNCTION PANEL
ALBUMIN: 2 g/dL — AB (ref 3.5–5.0)
ANION GAP: 42 — AB (ref 5–15)
BUN: 93 mg/dL — AB (ref 6–20)
CHLORIDE: 87 mmol/L — AB (ref 101–111)
CO2: 11 mmol/L — ABNORMAL LOW (ref 22–32)
Calcium: 6.2 mg/dL — CL (ref 8.9–10.3)
Creatinine, Ser: 6.49 mg/dL — ABNORMAL HIGH (ref 0.44–1.00)
GFR, EST AFRICAN AMERICAN: 7 mL/min — AB (ref >60)
GFR, EST NON AFRICAN AMERICAN: 6 mL/min — AB (ref >60)
Glucose, Bld: 198 mg/dL — ABNORMAL HIGH (ref 65–99)
PHOSPHORUS: 7.9 mg/dL — AB (ref 2.5–4.6)
POTASSIUM: 5 mmol/L (ref 3.5–5.1)
Sodium: 140 mmol/L (ref 135–145)

## 2016-06-23 LAB — BLOOD GAS, ARTERIAL
ACID-BASE DEFICIT: 13.2 mmol/L — AB (ref 0.0–2.0)
BICARBONATE: 11.2 mmol/L — AB (ref 20.0–28.0)
Drawn by: 449561
FIO2: 40
LHR: 30 {breaths}/min
O2 SAT: 98.7 %
PATIENT TEMPERATURE: 98.6
PCO2 ART: 20.1 mmHg — AB (ref 32.0–48.0)
PEEP/CPAP: 5 cmH2O
PH ART: 7.365 (ref 7.350–7.450)
VT: 470 mL
pO2, Arterial: 161 mmHg — ABNORMAL HIGH (ref 83.0–108.0)

## 2016-06-23 LAB — URINE CULTURE: Culture: 5000 — AB

## 2016-06-23 LAB — GLUCOSE, CAPILLARY
GLUCOSE-CAPILLARY: 135 mg/dL — AB (ref 65–99)
GLUCOSE-CAPILLARY: 164 mg/dL — AB (ref 65–99)
GLUCOSE-CAPILLARY: 177 mg/dL — AB (ref 65–99)
Glucose-Capillary: 194 mg/dL — ABNORMAL HIGH (ref 65–99)
Glucose-Capillary: 148 mg/dL — ABNORMAL HIGH (ref 65–99)
Glucose-Capillary: 140 mg/dL — ABNORMAL HIGH (ref 65–99)

## 2016-06-23 LAB — AMMONIA: Ammonia: 60 umol/L — ABNORMAL HIGH (ref 9–35)

## 2016-06-23 LAB — MAGNESIUM: Magnesium: 2.5 mg/dL — ABNORMAL HIGH (ref 1.7–2.4)

## 2016-06-23 LAB — CULTURE, BLOOD (ROUTINE X 2)

## 2016-06-23 LAB — LACTIC ACID, PLASMA: Lactic Acid, Venous: 19.6 mmol/L (ref 0.5–1.9)

## 2016-06-23 MED ORDER — SODIUM CHLORIDE 0.9 % IV SOLN
2.0000 g | Freq: Once | INTRAVENOUS | Status: AC
  Administered 2016-06-23: 2 g via INTRAVENOUS
  Filled 2016-06-23: qty 20

## 2016-06-23 NOTE — Progress Notes (Signed)
PULMONARY / CRITICAL CARE MEDICINE   Name: Angelica Beck MRN: [id number] DOB: 1951-01-18    ADMISSION DATE:  06/20/2016 CONSULTATION DATE:  06/20/2016  REFERRING MD:  EDP Dr Cathleen Fears   CHIEF COMPLAINT:  AMS  HISTORY OF PRESENT ILLNESS:   65 year old female with past medical history as below, which is significant for stroke with subsequent debilitation, diabetes, chronic kidney disease, chronic urinary tract infections due to incontinence, for which she takes chronic antibiotics. She lives at home with her daughter where she requires full assistance she is unable to truly ambulate and spends most of her time in a hospital bed/recliner. She relies on family members almost entirely for activities of daily living. She was in her usual state of health until about 9/23 when she developed nausea and vomiting. This persisted for 2 days until symptoms subsided, however, she continued to complain of abdominal pain. 9/26 when the patient's daughter got home from work the patient was found to be unresponsive. EMS was called and she was found hypoglycemic which was corrected, however, altered mental status did not improve. Upon arrival to the emergency department she continued to be lethargic and was found to be hypotensive with hypoxemia. Laboratory evaluation significant for potassium 7.4, bicarbonate less than 7, creatinine 6.51, BUN 95, WBC 12.3, lactic acid 13.88, pH 7.07.  SUBJECTIVE: No acute events overnight. Off vasopressors with slowly improving urine output.   REVIEW OF SYSTEMS:  Unable to obtain with intubation & sedation.   VITAL SIGNS: BP 131/62   Pulse (!) 101   Temp 99 F (37.2 C) (Oral)   Resp (!) 26   Ht '5\' 6"'$  (1.676 m)   Wt 245 lb 13 oz (111.5 kg)   SpO2 100%   BMI 39.68 kg/m   HEMODYNAMICS:    VENTILATOR SETTINGS: Vent Mode: CPAP;PSV FiO2 (%):  [40 %] 40 % Set Rate:  [30 bmp] 30 bmp Vt Set:  [470 mL] 470 mL PEEP:  [5 cmH20] 5 cmH20 Pressure Support:  [5 cmH20] 5  cmH20 Plateau Pressure:  [12 cmH20-20 cmH20] 12 cmH20  INTAKE / OUTPUT: I/O last 3 completed shifts: In: 5831.2 [I.V.:5381.2; Other:100; IV Piggyback:350] Out: 2992 [EQAST:4196]  PHYSICAL EXAMINATION: General:  Obese female. No family at bedside. Eyes closed. Neuro: Not following commands. Withdraws to pain in extremities. HEENT:  No scleral icterus or injection. ETT in place. Moist mucus membranes. Cardiovascular:  Regular rhythm and rate. No edema. Unable to appreciate JVD. Lungs:  Clear bilaterally to auscultation. Symmetric chest rise on ventilator.  Abdomen:  Soft. Protuberant. Hypoactive BS. Musculoskeletal:  No joint deformity or effusion. No edema in left or right arms. Skin:  Warm & dry. No rash on exposed skin. No erythema in left arm.   LABS:  BMET  Recent Labs Lab 06/21/16 1735 06/22/16 0333 06/23/16 0331  NA 142 142 140  K 5.2* 5.1 5.0  CL 98* 95* 87*  CO2 10* 10* 11*  BUN 92* 91* 93*  CREATININE 5.90* 6.18* 6.49*  GLUCOSE 101* 58* 198*    Electrolytes  Recent Labs Lab 06/21/16 0442 06/21/16 1735 06/22/16 0333 06/23/16 0330 06/23/16 0331  CALCIUM 7.4* 7.2* 6.8*  --  6.2*  MG 2.7*  --  2.8* 2.5*  --   PHOS 8.5*  --  8.1*  --  7.9*    CBC  Recent Labs Lab 06/21/16 0442 06/22/16 0333 06/23/16 0330  WBC 14.6* 15.0* 15.9*  HGB 9.3* 8.9* 8.5*  HCT 31.4* 28.4* 26.1*  PLT 265 256 224  Coag's  Recent Labs Lab 06/21/16 0135  INR 1.85    Sepsis Markers  Recent Labs Lab 06/21/16 0135  06/22/16 0817 06/22/16 1100 06/23/16 0845  LATICACIDVEN  --   < > 17.0* 18.4* 19.6*  PROCALCITON 5.09  --   --   --   --   < > = values in this interval not displayed.  ABG  Recent Labs Lab 06/21/16 1650 06/22/16 0434 06/23/16 0440  PHART 7.230* 7.269* 7.365  PCO2ART 21.9* 19.8* 20.1*  PO2ART 223* 198* 161*    Liver Enzymes  Recent Labs Lab 06/20/16 2058 06/22/16 0333 06/23/16 0331  AST 30 40  --   ALT 12* 15  --   ALKPHOS 59 61   --   BILITOT 0.9 0.7  --   ALBUMIN 2.9* 2.2* 2.0*    Cardiac Enzymes  Recent Labs Lab 06/21/16 0135 06/21/16 0442 06/21/16 0900  TROPONINI 0.04* 0.06* 0.06*    Glucose  Recent Labs Lab 06/22/16 1201 06/22/16 1528 06/22/16 1930 06/22/16 2354 06/23/16 0321 06/23/16 0740  GLUCAP 119* 135* 151* 189* 194* 177*    Imaging No results found.  STUDIES:  CT Head 9/27:  No acute abnormality. Chronic small vessel ischemic changes. Chronic lacunar infarct left basal ganglia & chronic ischemia right side of pons.  CT Abd/Pelvis w/o 9/27:  Early partial SBO w/ transition point LLQ with adhesions. Left humerus soft tissue swelling & intramuscular gas.  Left Arm X-ray 9/27:  Diffuse soft tissue edema.  TTE 9/27:  LV w/ EF 60-65%. Grade 1 Diastolic dysfunction. No regional wall motion abnormalities. LA & RA normal in size. RV normal in size & function. Mild AS w/o AR. Mild MR w/o MS.   MICROBIOLOGY: MRSA PCR 9/27:  Negative Blood Ctx x 2 9/26 >> Coag Neg Staph 1/2 Urine Ctx 9/26:  5000 CFU E coli & 1000 CFU Klebsiella   ANTIBIOTICS: Levaquin 9/26 - 9/28 Aztreonam 9/26 - 9/27 Vancomycin 9/26 >> Merrem 9/27 >>  SIGNIFICANT EVENTS: 9/26 - admit for presumed septic shock  LINES/TUBES: OETT 7.5 9/26 >> L IJ CVL 9/26 >> Foley 9/26 >> PIV x1  ASSESSMENT / PLAN:  PULMONARY A: Acute Hypoxic Respiratory Failure - Unable to protect airway with acute encephalopathy.  OHS  P:   Full Vent Support SBT as Mental Status allows Intermittent CXR   CARDIOVASCULAR A:  Shock - Likely sepsis. Cortisol 45.1. Resolved. H/O HTN & HLD  P:  Continuous telemetry monitoring Vitals per unit protocol Levophed to keep MAP > 31m/Hg  RENAL A:   Acute On Chronic Renal Failure Stage IV - UOP steadily improving.  Profound Metabolic Acidosis - Corrected with Bicarb gtt. Improving pH on ABG. Hyperkalemia - Resolved.  Lactic Acidosis - Likely due to poor renal clearance. Level  unchanged. Hyperphosphatemia - Secondary to acute renal failure. Improving. Hypocalcemia - Replaced.   P:   Monitoring UOP with Foley Trending electrolytes & renal function daily Replacing electrolytes as indicated Bicarb gtt @ 150cc/hr S/P 2amp Calcium Gluconate this AM Holding on Nephrology Consult given improving UOP Repeat LA & ABG in AM  GASTROINTESTINAL A:   Partial SBO H/O Ventral Hernia Repair Possible Liver Cirrhosis  P:   NPO Continuing OGT to low intermittent suction Pepcid IV daily   HEMATOLOGIC A:   Anemia - Chronic. No signs of active bleeding. Hgb stable. Leukocytosis - Likely due to sepsis. Worsening slowly.  P:  Trending cell counts daily w/ CBC SCDs Heparin [place] q8hr  INFECTIOUS A:  Severe Sepsis Unlikely Coag Neg Staph Bacteremia  Probable UTI vs Enteric Translocation with SBO  P:   Trending Procalcitonin per algorithm Empiric Vancomycin Day & Merrem Day #4 Following cultures to completion  Holding home Trimethoprim  ENDOCRINE A:   Hypoglycemia - Resolved with Dextrose IVF. H/O DM Type 2 - A1c 5.8. H/O Hyperthyroidism - On PTU at home. TSH 0.331 & F T4 1.33.   P:   SSI per Sensitive Algorithm Accu-Checks q4hr w/ MD notification parameters Holding PTU & Metformin 3amp Bicarb w/ D5 @ 150cc/hr  NEUROLOGIC A:   Acute Encephalopathy - Question hepatic vs toxic metabolic vs hypoxia. No improvement in Lactulose Enema despite improved Ammonia. Sedation on Ventilator  H/O CVA - Minimal mobility.  P:   RASS goal: 0 to -1 Versed IV prn Sedation Fentanyl IV prn Sedation  MUSCULOSKELETAL A: Edema Left Arm - Improved. Secondary to IV infiltration. No gas on X-ray 9/27.  P: Monitor closely.    FAMILY  - Updates: Daughter updated by Dr. Ashok Cordia via phone 9/29.   - Inter-disciplinary family meet or Palliative Care meeting due by:  10/2  TODAY'S SUMMARY:  65 y.o. female with hx stroke and severe debilitation presenting with AMS 9/26.  Presumed septic shock likely from bacteremia and probable UTI. Cultures pending. Continuing OGT to low intermittent suction. Electrolytes stable and UOP steadily improving. Now off vasopressors. Holding on Nephrology Consult for now. Monitoring electrolytes & serum BUN/Creatinine daily.   I have spent a total of 32 minutes of critical care time today caring for the patient and reviewing the patient's electronic medical record.   Sonia Baller Ashok Cordia, M.D. [facility] Pager:  [phone] After 3pm or if no response, call [phone] 06/23/2016 11:22 AM

## 2016-06-23 NOTE — Care Management Note (Addendum)
Case Management Note  Patient Details  Name: Angelica Beck MRN: [id number] Date of Birth: [birth year]  Subjective/Objective:   Pt admitted with  - pt is now on ventilator   Action/Plan:   She lives at home with her daughter where she requires full assistance, she is unable to truly ambulate and spends most of her time in a hospital bed/recliner. She relies on family members almost entirely for activities of daily living.  Pt may benefit from PT/OT eval for possible placement if family is unable to resume care at home.  CM will continue to follow pt for discharge needs   Expected Discharge Date:                  Expected Discharge Plan:     In-House Referral:     Discharge planning Services  CM Consult  Post Acute Care Choice:    Choice offered to:     DME Arranged:    DME Agency:     HH Arranged:    HH Agency:     Status of Service:  In process, will continue to follow  If discussed at Long Length of Stay Meetings, dates discussed:    Additional Comments:  Maryclare Labrador, RN 06/23/2016, 4:35 PM

## 2016-06-23 NOTE — Progress Notes (Signed)
[facility] Progress Note Patient Name: Angelica Beck DOB: 06-Feb-1951 MRN: [id number]   Date of Service  06/23/2016  HPI/Events of Note  Hypocalcemia  eICU Interventions  Calcium replaced     Intervention Category Intermediate Interventions: Electrolyte abnormality - evaluation and management  DETERDING,ELIZABETH 06/23/2016, 4:13 AM

## 2016-06-23 NOTE — Progress Notes (Signed)
Initial Nutrition Assessment  DOCUMENTATION CODES:   Obesity unspecified  INTERVENTION:    If unable to extubate and when SBO resolved, recommend initiate TF via OGT with Vital High Protein at goal rate of 60 ml/h (1440 ml per day) to provide 1440 kcals, 126 gm protein, 1204 ml free water daily.  NUTRITION DIAGNOSIS:   Inadequate oral intake related to inability to eat as evidenced by NPO status.  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 Patient brought to the ED on 9/26 after the patient's daughter found her to be unresponsive. She required intubation on admission.   Patient is currently intubated on ventilator support Temp (24hrs), Avg:98.6 F (37 C), Min:98 F (36.7 C), Max:99.1 F (37.3 C)  Patient is currently NPO due to partial SBO. No plans to start enteral nutrition today per discussion with RN. Labs reviewed: phosphorus and magnesium are elevated. CBG's: [phone] Medications reviewed and include Insulin and sodium bicarb drip.  Diet Order:  Diet NPO time specified  Skin:  Wound (see comment) (stage II pressure injury to lip)  Last BM:  9/28  Height:   Ht Readings from Last 1 Encounters:  06/20/16 '5\' 6"'$  (1.676 m)    Weight:   Wt Readings from Last 1 Encounters:  06/23/16 245 lb 13 oz (111.5 kg)    Ideal Body Weight:  59.1 kg  BMI:  Body mass index is 39.68 kg/m.  Estimated Nutritional Needs:   Kcal:  2202-5427  Protein:  >/= 118 gm  Fluid:  2 L  EDUCATION NEEDS:   No education needs identified at this time  Molli Barrows, [place], [place], [place] Pager [phone] After Hours Pager  [phone]

## 2016-06-23 NOTE — Progress Notes (Signed)
CRITICAL VALUE ALERT  Critical value received:  Lactic acid 19.6  Date of notification:  06/23/16  Time of notification:  4604  Critical value read back:Yes.    Nurse who received alert:  Allegra Grana, RN  MD notified (1st page):  1115  Time of first page:  Dr Ashok Cordia on floor during rounds  MD notified (2nd page):  Time of second page:  Responding MD:  Dr Ashok Cordia  Time MD responded:  1115

## 2016-06-23 NOTE — Progress Notes (Signed)
CRITICAL VALUE ALERT  Critical value received:  Calcium 6.2  Date of notification:  06/23/16  Time of notification:  0407  Critical value read back:yes  Nurse who received alert:  Deboraha Sprang, RN  MD notified (1st page):  Dr. Johnette Abraham. Deterding  Time of first page:  Orders placed

## 2016-06-23 NOTE — Care Management Important Message (Signed)
Important Message  Patient Details  Name: Angelica Beck MRN: [id number] Date of Birth: [birth year]   Medicare Important Message Given:  Yes    Derrious Bologna 06/23/2016, 11:04 AM

## 2016-06-23 NOTE — Progress Notes (Signed)
PCCM Attending Phone Note: Called and spoke with the patient's daughter [name] via phone. Updated her in the ongoing renal failure and the fact she has transitioned off of her vasopressors.   Sonia Baller Ashok Cordia, M.D. [facility] Pul[facility] & Critical Care Pager:  [phone] After 3pm or if no response, call [phone] 2:21 PM 06/23/16

## 2016-06-24 ENCOUNTER — Inpatient Hospital Stay (HOSPITAL_COMMUNITY): Payer: Medicare Other

## 2016-06-24 DIAGNOSIS — L8990 Pressure ulcer of unspecified site, unspecified stage: Secondary | ICD-10-CM | POA: Insufficient documentation

## 2016-06-24 DIAGNOSIS — Z452 Encounter for adjustment and management of vascular access device: Secondary | ICD-10-CM

## 2016-06-24 LAB — CBC WITH DIFFERENTIAL/PLATELET
BASOS ABS: 0 10*3/uL (ref 0.0–0.1)
BASOS PCT: 0 %
EOS ABS: 0 10*3/uL (ref 0.0–0.7)
EOS PCT: 0 %
HCT: 24.5 % — ABNORMAL LOW (ref 36.0–46.0)
Hemoglobin: 8.2 g/dL — ABNORMAL LOW (ref 12.0–15.0)
Lymphocytes Relative: 9 %
Lymphs Abs: 1.8 10*3/uL (ref 0.7–4.0)
MCH: 27.6 pg (ref 26.0–34.0)
MCHC: 33.5 g/dL (ref 30.0–36.0)
MCV: 82.5 fL (ref 78.0–100.0)
Monocytes Absolute: 2.8 10*3/uL — ABNORMAL HIGH (ref 0.1–1.0)
Monocytes Relative: 14 %
Neutro Abs: 14.9 10*3/uL — ABNORMAL HIGH (ref 1.7–7.7)
Neutrophils Relative %: 77 %
PLATELETS: 200 10*3/uL (ref 150–400)
RBC: 2.97 MIL/uL — AB (ref 3.87–5.11)
RDW: 14.7 % (ref 11.5–15.5)
WBC: 19.5 10*3/uL — AB (ref 4.0–10.5)

## 2016-06-24 LAB — BLOOD GAS, ARTERIAL
Acid-base deficit: 5.5 mmol/L — ABNORMAL HIGH (ref 0.0–2.0)
Acid-base deficit: 5.5 mmol/L — ABNORMAL HIGH (ref 0.0–2.0)
BICARBONATE: 17.3 mmol/L — AB (ref 20.0–28.0)
BICARBONATE: 17.4 mmol/L — AB (ref 20.0–28.0)
Drawn by: 252031
Drawn by: 398991
FIO2: 40
FIO2: 40
LHR: 30 {breaths}/min
LHR: 20 {breaths}/min
MECHVT: 470 mL
O2 Saturation: 98.7 %
O2 Saturation: 99.1 %
PCO2 ART: 22.8 mmHg — AB (ref 32.0–48.0)
PCO2 ART: 23.2 mmHg — AB (ref 32.0–48.0)
PEEP: 5 cmH2O
PEEP: 5 cmH2O
PH ART: 7.492 — AB (ref 7.350–7.450)
PO2 ART: 154 mmHg — AB (ref 83.0–108.0)
Patient temperature: 98.6
Patient temperature: 98.6
VT: 470 mL
pH, Arterial: 7.486 — ABNORMAL HIGH (ref 7.350–7.450)
pO2, Arterial: 170 mmHg — ABNORMAL HIGH (ref 83.0–108.0)

## 2016-06-24 LAB — RENAL FUNCTION PANEL
ALBUMIN: 1.8 g/dL — AB (ref 3.5–5.0)
ANION GAP: 42 — AB (ref 5–15)
Albumin: 1.8 g/dL — ABNORMAL LOW (ref 3.5–5.0)
Anion gap: 44 — ABNORMAL HIGH (ref 5–15)
BUN: 96 mg/dL — AB (ref 6–20)
BUN: 96 mg/dL — ABNORMAL HIGH (ref 6–20)
CALCIUM: 6.3 mg/dL — AB (ref 8.9–10.3)
CHLORIDE: 82 mmol/L — AB (ref 101–111)
CO2: 17 mmol/L — AB (ref 22–32)
CO2: 16 mmol/L — AB (ref 22–32)
CREATININE: 6.88 mg/dL — AB (ref 0.44–1.00)
Calcium: 6.1 mg/dL — CL (ref 8.9–10.3)
Chloride: 81 mmol/L — ABNORMAL LOW (ref 101–111)
Creatinine, Ser: 6.62 mg/dL — ABNORMAL HIGH (ref 0.44–1.00)
GFR calc Af Amer: 7 mL/min — ABNORMAL LOW (ref >60)
GFR, EST AFRICAN AMERICAN: 7 mL/min — AB (ref >60)
GFR, EST NON AFRICAN AMERICAN: 6 mL/min — AB (ref >60)
GFR, EST NON AFRICAN AMERICAN: 6 mL/min — AB (ref >60)
GLUCOSE: 138 mg/dL — AB (ref 65–99)
Glucose, Bld: 95 mg/dL (ref 65–99)
PHOSPHORUS: 7.1 mg/dL — AB (ref 2.5–4.6)
POTASSIUM: 4.9 mmol/L (ref 3.5–5.1)
Phosphorus: 7.2 mg/dL — ABNORMAL HIGH (ref 2.5–4.6)
Potassium: 4.6 mmol/L (ref 3.5–5.1)
SODIUM: 139 mmol/L (ref 135–145)
Sodium: 143 mmol/L (ref 135–145)

## 2016-06-24 LAB — LACTIC ACID, PLASMA
LACTIC ACID, VENOUS: 20.1 mmol/L — AB (ref 0.5–1.9)
Lactic Acid, Venous: 20.9 mmol/L (ref 0.5–1.9)

## 2016-06-24 LAB — GLUCOSE, CAPILLARY
GLUCOSE-CAPILLARY: 135 mg/dL — AB (ref 65–99)
GLUCOSE-CAPILLARY: 127 mg/dL — AB (ref 65–99)
GLUCOSE-CAPILLARY: 132 mg/dL — AB (ref 65–99)
GLUCOSE-CAPILLARY: 84 mg/dL (ref 65–99)
GLUCOSE-CAPILLARY: 76 mg/dL (ref 65–99)

## 2016-06-24 LAB — MAGNESIUM: MAGNESIUM: 2.5 mg/dL — AB (ref 1.7–2.4)

## 2016-06-24 LAB — SODIUM, URINE, RANDOM: Sodium, Ur: 80 mmol/L

## 2016-06-24 LAB — CREATININE, URINE, RANDOM: CREATININE, URINE: 22.09 mg/dL

## 2016-06-24 MED ORDER — SODIUM CHLORIDE 0.9 % IV SOLN
2.0000 g | Freq: Once | INTRAVENOUS | Status: AC
  Administered 2016-06-24: 2 g via INTRAVENOUS
  Filled 2016-06-24: qty 20

## 2016-06-24 MED ORDER — VANCOMYCIN HCL IN DEXTROSE 1-5 GM/200ML-% IV SOLN
1000.0000 mg | INTRAVENOUS | Status: DC
  Administered 2016-06-24: 1000 mg via INTRAVENOUS
  Filled 2016-06-24 (×2): qty 200

## 2016-06-24 MED ORDER — PRISMASOL BGK 4/2.5 32-4-2.5 MEQ/L IV SOLN
INTRAVENOUS | Status: DC
  Administered 2016-06-24 – 2016-06-27 (×22): via INTRAVENOUS_CENTRAL
  Filled 2016-06-24 (×34): qty 5000

## 2016-06-24 MED ORDER — SODIUM CHLORIDE 0.9 % IV SOLN
INTRAVENOUS | Status: DC
  Administered 2016-06-24: 11:00:00 via INTRAVENOUS

## 2016-06-24 MED ORDER — SODIUM CHLORIDE 0.9 % IV SOLN
1.0000 g | Freq: Two times a day (BID) | INTRAVENOUS | Status: DC
  Administered 2016-06-24 – 2016-06-27 (×6): 1 g via INTRAVENOUS
  Filled 2016-06-24 (×7): qty 1

## 2016-06-24 MED ORDER — PRISMASOL BGK 4/2.5 32-4-2.5 MEQ/L IV SOLN
INTRAVENOUS | Status: DC
  Administered 2016-06-24 – 2016-06-27 (×12): via INTRAVENOUS_CENTRAL
  Filled 2016-06-24 (×14): qty 5000

## 2016-06-24 MED ORDER — HEPARIN SODIUM (PORCINE) 1000 UNIT/ML DIALYSIS
1000.0000 [IU] | INTRAMUSCULAR | Status: DC | PRN
  Administered 2016-06-27: 2400 [IU] via INTRAVENOUS_CENTRAL
  Filled 2016-06-24: qty 3
  Filled 2016-06-24 (×2): qty 6
  Filled 2016-06-24: qty 3

## 2016-06-24 MED ORDER — PRISMASOL BGK 4/2.5 32-4-2.5 MEQ/L IV SOLN
INTRAVENOUS | Status: DC
  Administered 2016-06-24 – 2016-06-27 (×11): via INTRAVENOUS_CENTRAL
  Filled 2016-06-24 (×12): qty 5000

## 2016-06-24 MED ORDER — HEPARIN SODIUM (PORCINE) 5000 UNIT/ML IJ SOLN
250.0000 [IU]/h | INTRAMUSCULAR | Status: DC
  Filled 2016-06-24 (×2): qty 2

## 2016-06-24 MED ORDER — HEPARIN BOLUS VIA INFUSION (CRRT)
1000.0000 [IU] | INTRAVENOUS | Status: DC | PRN
  Filled 2016-06-24: qty 1000

## 2016-06-24 MED ORDER — SODIUM CHLORIDE 0.9 % FOR CRRT
INTRAVENOUS_CENTRAL | Status: DC | PRN
  Filled 2016-06-24: qty 1000

## 2016-06-24 NOTE — Progress Notes (Signed)
Critical lab Calcium 6.1 called to [facility] MD Dr Deterding

## 2016-06-24 NOTE — Consult Note (Signed)
Reason for Consult:AKI/CKD acidemia Referring Physician: Dr. Tillie Rung Klatt is an 65 y.o. female.  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  Cr 1.55 on 8/31.  Admitted on 9/26 after 3 d of illness.  Developed hypoglycemia, found to have low bps, and had septic schock.  Cr on ad mit 6.5, fell to 5.5 after over 10 L of fluid, and now risen back to 6.88.  Nonoliguric, but persistent AG with ^^lactate , not clearing.  Thought to have SBO on admit, and poss arm cellulits but not proven.  Had CT abdm that shows liver ?? Cirrhosis.Marland Kitchen  No AG at baseline.  Was on pressors, now off.  Has resp failure and is on vent.  CXR c/w pulm edema.  Sees Dr. Lorrene Reid in our office. Review of systems not obtained due to patient factors.   . Primary Nephrologist Dr. Lorrene Reid. *.   Past Medical History:  Diagnosis Date  . Arthritis   . Diabetes mellitus, type 2 ([place])   . Hyperlipidemia   . Hypertension   . Hyperthyroidism   . Obesity hypoventilation syndrome ([place]) 06/20/2013  . Stroke [facility])     Past Surgical History:  Procedure Laterality Date  . VENTRAL HERNIA REPAIR      Family History  Problem Relation Age of Onset  . Diabetes Mother     Social History:  reports that she quit smoking about 13 years ago. Her smoking use included Cigarettes. She has a 7.50 pack-year smoking history. She has never used smokeless tobacco. She reports that she does not drink alcohol or use drugs.  Allergies:  Allergies  Allergen Reactions  . Lactose Intolerance (Gi) Diarrhea and Nausea And Vomiting  . Penicillins Swelling    Facial swelling    Medications:  I have reviewed the patient's current medications. Prior to Admission:  Prescriptions Prior to Admission  Medication Sig Dispense Refill Last Dose  . Cholecalciferol (VITAMIN D) 2000 UNITS tablet Take 2,000 Units by mouth daily.   Past Week at  Unknown time  . clopidogrel (PLAVIX) 75 MG tablet Take 1 tablet (75 mg total) by mouth daily. 30 tablet 1 Past Week at Unknown time  . gabapentin (NEURONTIN) 100 MG capsule Take 1 capsule (100 mg total) by mouth at bedtime. 30 capsule 1 Past Week at Unknown time  . insulin glargine (LANTUS) 100 UNIT/ML injection 10 units at breakfast and 20 units every evening 10 mL 11 Past Week at Unknown time  . metFORMIN (GLUCOPHAGE) 1000 MG tablet Take 1,000 mg by mouth 2 (two) times daily with a meal.   Past Week at Unknown time  . metoprolol succinate (TOPROL-XL) 50 MG 24 hr tablet Take 50 mg by mouth daily. Take with or immediately following a meal.   06/20/2016 at 0800  . Multiple Vitamin (MULTIVITAMIN WITH MINERALS) TABS tablet Take 1 tablet by mouth daily.   Past Week at Unknown time  . niacin (NIASPAN) 1000 MG CR tablet Take 1 tablet (1,000 mg total) by mouth at bedtime. 30 tablet 1 Past Week at Unknown time  . nitroGLYCERIN (NITROSTAT) 0.4 MG SL tablet Place 1 tablet (0.4 mg total) under the tongue every 5 (five) minutes as needed for chest pain. 30 tablet 0 Past Week at Unknown time  . OXYGEN Inhale 2 L into the lungs at bedtime.   Past Week at Unknown time  .  propylthiouracil (PTU) 50 MG tablet Take 1 tablet (50 mg total) by mouth 2 (two) times daily. 60 tablet 1 Past Week at Unknown time  . ranitidine (ZANTAC) 150 MG tablet Take 150 mg by mouth 2 (two) times daily.    Past Week at Unknown time  . rosuvastatin (CRESTOR) 40 MG tablet Take 1 tablet (40 mg total) by mouth at bedtime. 30 tablet 1 Past Week at Unknown time  . traMADol (ULTRAM) 50 MG tablet Take 50 mg by mouth every 12 (twelve) hours as needed for severe pain.   Past Month at Unknown time  . trimethoprim (TRIMPEX) 100 MG tablet Take 1 tablet by mouth daily.   Past Week at Unknown time  . vitamin C (ASCORBIC ACID) 500 MG tablet Take 500 mg by mouth daily.   Past Week at Unknown time    Results for orders placed or performed during the  hospital encounter of 06/20/16 (from the past 48 hour(s))  Glucose, capillary     Status: Abnormal   Collection Time: 06/22/16 12:01 PM  Result Value Ref Range   Glucose-Capillary 119 (H) 65 - 99 mg/dL   Comment 1 Document in Chart   Glucose, capillary     Status: Abnormal   Collection Time: 06/22/16  3:28 PM  Result Value Ref Range   Glucose-Capillary 135 (H) 65 - 99 mg/dL   Comment 1 Document in Chart   Glucose, capillary     Status: Abnormal   Collection Time: 06/22/16  7:30 PM  Result Value Ref Range   Glucose-Capillary 151 (H) 65 - 99 mg/dL   Comment 1 Notify RN    Comment 2 Document in Chart   Glucose, capillary     Status: Abnormal   Collection Time: 06/22/16 11:54 PM  Result Value Ref Range   Glucose-Capillary 189 (H) 65 - 99 mg/dL   Comment 1 Notify RN    Comment 2 Document in Chart   Glucose, capillary     Status: Abnormal   Collection Time: 06/23/16  3:21 AM  Result Value Ref Range   Glucose-Capillary 194 (H) 65 - 99 mg/dL   Comment 1 Notify RN    Comment 2 Document in Chart   CBC with Differential/Platelet     Status: Abnormal   Collection Time: 06/23/16  3:30 AM  Result Value Ref Range   WBC 15.9 (H) 4.0 - 10.5 K/uL   RBC 3.07 (L) 3.87 - 5.11 MIL/uL   Hemoglobin 8.5 (L) 12.0 - 15.0 g/dL   HCT 26.1 (L) 36.0 - 46.0 %   MCV 85.0 78.0 - 100.0 fL   MCH 27.7 26.0 - 34.0 pg   MCHC 32.6 30.0 - 36.0 g/dL   RDW 15.6 (H) 11.5 - 15.5 %   Platelets 224 150 - 400 K/uL   Neutrophils Relative % 78 %   Lymphocytes Relative 9 %   Monocytes Relative 13 %   Eosinophils Relative 0 %   Basophils Relative 0 %   Neutro Abs 12.4 (H) 1.7 - 7.7 K/uL   Lymphs Abs 1.4 0.7 - 4.0 K/uL   Monocytes Absolute 2.1 (H) 0.1 - 1.0 K/uL   Eosinophils Absolute 0.0 0.0 - 0.7 K/uL   Basophils Absolute 0.0 0.0 - 0.1 K/uL   WBC Morphology INCREASED BANDS (>20% BANDS)     Comment: TOXIC GRANULATION  Magnesium     Status: Abnormal   Collection Time: 06/23/16  3:30 AM  Result Value Ref Range    Magnesium 2.5 (H) 1.7 -  2.4 mg/dL  Renal function panel     Status: Abnormal   Collection Time: 06/23/16  3:31 AM  Result Value Ref Range   Sodium 140 135 - 145 mmol/L   Potassium 5.0 3.5 - 5.1 mmol/L   Chloride 87 (L) 101 - 111 mmol/L   CO2 11 (L) 22 - 32 mmol/L   Glucose, Bld 198 (H) 65 - 99 mg/dL   BUN 93 (H) 6 - 20 mg/dL   Creatinine, Ser 6.49 (H) 0.44 - 1.00 mg/dL   Calcium 6.2 (LL) 8.9 - 10.3 mg/dL    Comment: CRITICAL RESULT CALLED TO, READ BACK BY AND VERIFIED WITH: FLYNT F,RN 06/23/16 0404 WAYK    Phosphorus 7.9 (H) 2.5 - 4.6 mg/dL   Albumin 2.0 (L) 3.5 - 5.0 g/dL   GFR calc non Af Amer 6 (L) >60 mL/min   GFR calc Af Amer 7 (L) >60 mL/min    Comment: (NOTE) The eGFR has been calculated using the CKD EPI equation. This calculation has not been validated in all clinical situations. eGFR's persistently <60 mL/min signify possible Chronic Kidney Disease.    Anion gap 42 (H) 5 - 15  Ammonia     Status: Abnormal   Collection Time: 06/23/16  3:32 AM  Result Value Ref Range   Ammonia 60 (H) 9 - 35 umol/L  Blood gas, arterial     Status: Abnormal   Collection Time: 06/23/16  4:40 AM  Result Value Ref Range   FIO2 40.00    Delivery systems VENTILATOR    Mode PRESSURE REGULATED VOLUME CONTROL    VT 470 mL   LHR 30 resp/min   Peep/cpap 5.0 cm H20   pH, Arterial 7.365 7.350 - 7.450   pCO2 arterial 20.1 (L) 32.0 - 48.0 mmHg   pO2, Arterial 161 (H) 83.0 - 108.0 mmHg   Bicarbonate 11.2 (L) 20.0 - 28.0 mmol/L   Acid-base deficit 13.2 (H) 0.0 - 2.0 mmol/L   O2 Saturation 98.7 %   Patient temperature 98.6    Collection site RIGHT RADIAL    Drawn by [phone]    Sample type ARTERIAL DRAW    Allens test (pass/fail) PASS PASS  Glucose, capillary     Status: Abnormal   Collection Time: 06/23/16  7:40 AM  Result Value Ref Range   Glucose-Capillary 177 (H) 65 - 99 mg/dL   Comment 1 Notify RN    Comment 2 Document in Chart   Lactic acid, plasma     Status: Abnormal   Collection  Time: 06/23/16  8:45 AM  Result Value Ref Range   Lactic Acid, Venous 19.6 (HH) 0.5 - 1.9 mmol/L    Comment: RESULTS CONFIRMED BY MANUAL DILUTION CRITICAL RESULT CALLED TO, READ BACK BY AND VERIFIED WITH: M.TOLER,RN 06/23/16 1014 BY BSLADE   Glucose, capillary     Status: Abnormal   Collection Time: 06/23/16 11:46 AM  Result Value Ref Range   Glucose-Capillary 164 (H) 65 - 99 mg/dL   Comment 1 Notify RN    Comment 2 Document in Chart   Glucose, capillary     Status: Abnormal   Collection Time: 06/23/16  3:34 PM  Result Value Ref Range   Glucose-Capillary 148 (H) 65 - 99 mg/dL   Comment 1 Notify RN    Comment 2 Document in Chart   Glucose, capillary     Status: Abnormal   Collection Time: 06/23/16  7:50 PM  Result Value Ref Range   Glucose-Capillary 140 (H) 65 -  99 mg/dL   Comment 1 Notify RN    Comment 2 Document in Chart   Glucose, capillary     Status: Abnormal   Collection Time: 06/23/16 11:42 PM  Result Value Ref Range   Glucose-Capillary 135 (H) 65 - 99 mg/dL   Comment 1 Notify RN    Comment 2 Document in Chart   Glucose, capillary     Status: Abnormal   Collection Time: 06/24/16  3:26 AM  Result Value Ref Range   Glucose-Capillary 135 (H) 65 - 99 mg/dL   Comment 1 Notify RN    Comment 2 Document in Chart   Blood gas, arterial     Status: Abnormal   Collection Time: 06/24/16  4:00 AM  Result Value Ref Range   FIO2 40.00    Delivery systems VENTILATOR    Mode PRESSURE REGULATED VOLUME CONTROL    VT 470 mL   LHR 30 resp/min   Peep/cpap 5.0 cm H20   pH, Arterial 7.492 (H) 7.350 - 7.450   pCO2 arterial 22.8 (L) 32.0 - 48.0 mmHg   pO2, Arterial 154 (H) 83.0 - 108.0 mmHg   Bicarbonate 17.3 (L) 20.0 - 28.0 mmol/L   Acid-base deficit 5.5 (H) 0.0 - 2.0 mmol/L   O2 Saturation 98.7 %   Patient temperature 98.6    Collection site ARTERIAL DRAW    Drawn by [phone]    Sample type ARTERIAL DRAW    Allens test (pass/fail) PASS PASS  CBC with Differential/Platelet      Status: Abnormal   Collection Time: 06/24/16  4:35 AM  Result Value Ref Range   WBC 19.5 (H) 4.0 - 10.5 K/uL   RBC 2.97 (L) 3.87 - 5.11 MIL/uL   Hemoglobin 8.2 (L) 12.0 - 15.0 g/dL   HCT 24.5 (L) 36.0 - 46.0 %   MCV 82.5 78.0 - 100.0 fL   MCH 27.6 26.0 - 34.0 pg   MCHC 33.5 30.0 - 36.0 g/dL   RDW 14.7 11.5 - 15.5 %   Platelets 200 150 - 400 K/uL   Neutrophils Relative % 77 %   Neutro Abs 14.9 (H) 1.7 - 7.7 K/uL   Lymphocytes Relative 9 %   Lymphs Abs 1.8 0.7 - 4.0 K/uL   Monocytes Relative 14 %   Monocytes Absolute 2.8 (H) 0.1 - 1.0 K/uL   Eosinophils Relative 0 %   Eosinophils Absolute 0.0 0.0 - 0.7 K/uL   Basophils Relative 0 %   Basophils Absolute 0.0 0.0 - 0.1 K/uL  Magnesium     Status: Abnormal   Collection Time: 06/24/16  4:35 AM  Result Value Ref Range   Magnesium 2.5 (H) 1.7 - 2.4 mg/dL  Renal function panel     Status: Abnormal   Collection Time: 06/24/16  4:35 AM  Result Value Ref Range   Sodium 143 135 - 145 mmol/L   Potassium 4.9 3.5 - 5.1 mmol/L   Chloride 82 (L) 101 - 111 mmol/L   CO2 17 (L) 22 - 32 mmol/L   Glucose, Bld 138 (H) 65 - 99 mg/dL   BUN 96 (H) 6 - 20 mg/dL   Creatinine, Ser 6.88 (H) 0.44 - 1.00 mg/dL   Calcium 6.1 (LL) 8.9 - 10.3 mg/dL    Comment: CRITICAL RESULT CALLED TO, READ BACK BY AND VERIFIED WITH: MOTLEY J,RN 06/24/16 0507 WAYK    Phosphorus 7.2 (H) 2.5 - 4.6 mg/dL   Albumin 1.8 (L) 3.5 - 5.0 g/dL   GFR calc non Af  Amer 6 (L) >60 mL/min   GFR calc Af Amer 7 (L) >60 mL/min    Comment: (NOTE) The eGFR has been calculated using the CKD EPI equation. This calculation has not been validated in all clinical situations. eGFR's persistently <60 mL/min signify possible Chronic Kidney Disease.    Anion gap 44 (H) 5 - 15  Lactic acid, plasma     Status: Abnormal   Collection Time: 06/24/16  4:37 AM  Result Value Ref Range   Lactic Acid, Venous 20.9 (HH) 0.5 - 1.9 mmol/L    Comment: RESULTS CONFIRMED BY MANUAL DILUTION CRITICAL RESULT  CALLED TO, READ BACK BY AND VERIFIED WITH: MOTLEY J,RN 06/24/16 0525 WAYK   Glucose, capillary     Status: Abnormal   Collection Time: 06/24/16  7:58 AM  Result Value Ref Range   Glucose-Capillary 127 (H) 65 - 99 mg/dL   Comment 1 Notify RN    Comment 2 Document in Chart     Dg Abd 1 View  Result Date: 06/24/2016 CLINICAL DATA:  Small bowel obstruction. EXAM: ABDOMEN - 1 VIEW COMPARISON:  June 22, 2016 FINDINGS: Small bowel remains dilated out of proportion to colonic gas measuring up to 4.3 cm, mildly improved in the interval. Minimal colonic gas in the colon. Large calcification in the right side of pelvis is likely a calcified uterine fibroid. NG tube terminates in the distal stomach. No other acute abnormalities. IMPRESSION: 1. Persistent but mildly improved small bowel obstruction. 2. Calcified uterine fibroid. Electronically Signed   By: Dorise Bullion III M.D   On: 06/24/2016 10:19   Dg Chest Port 1 View  Result Date: 06/24/2016 CLINICAL DATA:  Respiratory failure EXAM: PORTABLE CHEST 1 VIEW COMPARISON:  06/20/2016 FINDINGS: Endotracheal tube tip 1.3 cm from the carina. NG tube placed. It is coiled in the stomach. Tip is beyond the lower limit of the image. Left jugular venous catheter stable. The heart is mildly enlarged. Vascular congestion has increased. Hazy central basilar airspace disease left greater than right has developed. No pneumothorax. IMPRESSION: Endotracheal tube advanced. NG tube placed. Increasing bilateral central basilar airspace disease left greater than right worrisome for pulmonary edema. Electronically Signed   By: Marybelle Killings M.D.   On: 06/24/2016 10:16    ROS Blood pressure (!) 119/46, pulse (!) 105, temperature 99 F (37.2 C), temperature source Oral, resp. rate (!) 34, height '5\' 6"'  (1.676 m), weight 117.3 kg (258 lb 9.6 oz), SpO2 100 %. Physical Exam Physical Examination: General appearance - sedated on vent Mental status - moves eyes, but not  cognizant, and not coop  Eyes - pupils equal and reactive, extraocular eye movements intact Mouth - some erosions on lips Neck - adenopathy noted PCL Lymphatics - posterior cervical nodes Chest - rales noted bibasilar, decreased air entry noted bilat Heart - S1 and S2 normal, systolic murmur GS8/1 at apex Abdomen - obese, few bs, soft Extremities - pedal edema 2-3+ +, intact peripheral pulses Skin - normal coloration and turgor, no rashes, no suspicious skin lesions noted  Assessment/Plan: 1 AKI vol xs , acidemic.  Sarajane Jews.  Needs RRT. Because of severely ill , will use CRRT.  Appears hemodynamic etiology.  2 Lactic acidosis   3 things for nonclearing poss: liver dz, ongoing source, or L-lactate(doubt).  Will see if clears with CRRT   Suspect liver dz primary issue 3 Hypertension: not an issue 4. Anemia will check Fe 5. Metabolic Bone Disease: check PTH 6 Sepsis on BSAB 7 Obesity 8  DM  9 CVA 10 Severe debill this limits outlook 11 REsp failure, will lower vol P CRRT, U/S, urine chem,  AB,   Akosua Constantine L 06/24/2016, 11:26 AM

## 2016-06-24 NOTE — Progress Notes (Signed)
[facility] Progress Note Patient Name: Angelica Beck DOB: 08/26/1951 MRN: [id number]   Date of Service  06/24/2016  HPI/Events of Note  hypocalcemia  eICU Interventions  Calcium replaced     Intervention Category Intermediate Interventions: Electrolyte abnormality - evaluation and management  Tatum Massman 06/24/2016, 5:12 AM

## 2016-06-24 NOTE — Progress Notes (Addendum)
Pharmacy Antibiotic Note  Angelica Beck is a 65 y.o. female admitted on 06/20/2016 with sepsis.  Pharmacy has been consulted for vancomycin and meropenem dosing. Patient continues to have elevated SCr and will begin CRRT today.   Plan: - Change vancomycin to 1000 mg IV q24h - Change meropenem to 1000 mg IV q12h - Monitor renal plans, C&S, and LOT  - Check VT prn  Height: '5\' 6"'$  (167.6 cm) Weight: 258 lb 9.6 oz (117.3 kg) IBW/kg (Calculated) : 59.3  Temp (24hrs), Avg:98.9 F (37.2 C), Min:98.5 F (36.9 C), Max:99.4 F (37.4 C)   Recent Labs Lab 06/21/16 0136 06/21/16 0442  06/21/16 1735 06/21/16 2319 06/22/16 0333 06/22/16 0817 06/22/16 1100 06/23/16 0330 06/23/16 0331 06/23/16 0845 06/24/16 0435 06/24/16 0437  WBC 13.8* 14.6*  --   --   --  15.0*  --   --  15.9*  --   --  19.5*  --   CREATININE 5.65* 5.55*  --  5.90*  --  6.18*  --   --   --  6.49*  --  6.88*  --   LATICACIDVEN  --   --   < >  --  18.0*  --  17.0* 18.4*  --   --  19.6*  --  20.9*  < > = values in this interval not displayed.  Estimated Creatinine Clearance: 10.9 mL/min (by C-G formula based on SCr of 6.88 mg/dL (H)).    Allergies  Allergen Reactions  . Lactose Intolerance (Gi) Diarrhea and Nausea And Vomiting  . Penicillins Swelling    Facial swelling    Antimicrobials this admission:  Vanc 9/26 >> Azactam 9/26 >>9/27 LVQ 9/26 >>9/27 Merrem 9/27>>  Dose adjustments this admission:  Started CRRT 9/30: Changed vancomycin to 1g IV q24h, Merrem 1g IV q12h  Microbiology results:  9/27 MRSA PCR: neg 9/26 BCx: CONS (BCID: staph species) 1/2 9/26 UCx: 5k col Ecoli, 1k col Kleb pneumo - likely colonized  Thank you for allowing pharmacy to be a part of this patient's care.  Dimitri Ped, PharmD. PGY-2 Infectious Diseases Pharmacy Resident Pager: [phone] 06/24/2016, 2:10 PM

## 2016-06-24 NOTE — Procedures (Signed)
Central Venous dialysis Catheter Insertion Procedure Note Tonji Elliff [id number] 06-Aug-1951  Procedure: Insertion of Central Venous Catheter Indications: dialysis   Procedure Details Consent: Risks of procedure as well as the alternatives and risks of each were explained to the (patient/caregiver).  Consent for procedure obtained. Time Out: Verified patient identification, verified procedure, site/side was marked, verified correct patient position, special equipment/implants available, medications/allergies/relevent history reviewed, required imaging and test results available.  Performed Real tie [place] was used to ID and cannulate the vessel  Maximum sterile technique was used including antiseptics, cap, gloves, gown, hand hygiene, mask and sheet. Skin prep: Chlorhexidine; local anesthetic administered A antimicrobial bonded/coated triple lumen catheter was placed in the right internal jugular vein using the Seldinger technique.  Evaluation Blood flow good Complications: No apparent complications Patient did tolerate procedure well. Chest X-ray ordered to verify placement.  CXR: pending.  Clementeen Graham 06/24/2016, 1:14 PM  Erick Colace ACNP-BC [place] Pager # [phone] OR # [phone] if no answer

## 2016-06-24 NOTE — Progress Notes (Signed)
PULMONARY / CRITICAL CARE MEDICINE   Name: Angelica Beck MRN: [id number] DOB: 11-22-1950    ADMISSION DATE:  06/20/2016 CONSULTATION DATE:  06/20/2016  REFERRING MD:  EDP Dr Cathleen Fears   CHIEF COMPLAINT:  AMS  HISTORY OF PRESENT ILLNESS:   65 year old female with past medical history as below, which is significant for stroke with subsequent debilitation, diabetes, chronic kidney disease, chronic urinary tract infections due to incontinence, for which she takes chronic antibiotics. She lives at home with her daughter where she requires full assistance she is unable to truly ambulate and spends most of her time in a hospital bed/recliner. She relies on family members almost entirely for activities of daily living. She was in her usual state of health until about 9/23 when she developed nausea and vomiting. This persisted for 2 days until symptoms subsided, however, she continued to complain of abdominal pain. 9/26 when the patient's daughter got home from work the patient was found to be unresponsive. EMS was called and she was found hypoglycemic which was corrected, however, altered mental status did not improve. Upon arrival to the emergency department she continued to be lethargic and was found to be hypotensive with hypoxemia. Laboratory evaluation significant for potassium 7.4, bicarbonate less than 7, creatinine 6.51, BUN 95, WBC 12.3, lactic acid 13.88, pH 7.07.  SUBJECTIVE: No acute events overnight. Off vasopressors  UO remains good afebrile  REVIEW OF SYSTEMS:  Unable to obtain with intubation & sedation.   VITAL SIGNS: BP (!) 119/46   Pulse (!) 105   Temp 99 F (37.2 C) (Oral)   Resp (!) 34   Ht '5\' 6"'$  (1.676 m)   Wt 258 lb 9.6 oz (117.3 kg)   SpO2 100%   BMI 41.74 kg/m   HEMODYNAMICS:    VENTILATOR SETTINGS: Vent Mode: PSV;CPAP FiO2 (%):  [40 %] 40 % Set Rate:  [30 bmp] 30 bmp Vt Set:  [470 mL] 470 mL PEEP:  [5 cmH20] 5 cmH20 Pressure Support:  [5 cmH20-10 cmH20] 10  cmH20 Plateau Pressure:  [18 cmH20] 18 cmH20  INTAKE / OUTPUT: I/O last 3 completed shifts: In: 6210 [I.V.:5590; Other:100; IV Piggyback:520] Out: 2205 [Urine:2205]  PHYSICAL EXAMINATION: General:  Obese female.  Eyes closed. Neuro: Not following commands. Withdraws to pain in extremities. HEENT:  No scleral icterus or injection. ETT in place. Moist mucus membranes. Cardiovascular:  Regular rhythm and rate. No edema. Unable to appreciate JVD. Lungs:  Clear bilaterally to auscultation. Symmetric chest rise on ventilator.  Abdomen:  Soft. Protuberant. Hypoactive BS. Musculoskeletal:  No joint deformity or effusion. No crepitus Skin:  Warm & dry. No rash on exposed skin. No erythema in left arm.   LABS:  BMET  Recent Labs Lab 06/22/16 0333 06/23/16 0331 06/24/16 0435  NA 142 140 143  K 5.1 5.0 4.9  CL 95* 87* 82*  CO2 10* 11* 17*  BUN 91* 93* 96*  CREATININE 6.18* 6.49* 6.88*  GLUCOSE 58* 198* 138*    Electrolytes  Recent Labs Lab 06/22/16 0333 06/23/16 0330 06/23/16 0331 06/24/16 0435  CALCIUM 6.8*  --  6.2* 6.1*  MG 2.8* 2.5*  --  2.5*  PHOS 8.1*  --  7.9* 7.2*    CBC  Recent Labs Lab 06/22/16 0333 06/23/16 0330 06/24/16 0435  WBC 15.0* 15.9* 19.5*  HGB 8.9* 8.5* 8.2*  HCT 28.4* 26.1* 24.5*  PLT 256 224 200    Coag's  Recent Labs Lab 06/21/16 0135  INR 1.85    Sepsis Markers  Recent Labs Lab 06/21/16 0135  06/22/16 1100 06/23/16 0845 06/24/16 0437  LATICACIDVEN  --   < > 18.4* 19.6* 20.9*  PROCALCITON 5.09  --   --   --   --   < > = values in this interval not displayed.  ABG  Recent Labs Lab 06/22/16 0434 06/23/16 0440 06/24/16 0400  PHART 7.269* 7.365 7.492*  PCO2ART 19.8* 20.1* 22.8*  PO2ART 198* 161* 154*    Liver Enzymes  Recent Labs Lab 06/20/16 2058 06/22/16 0333 06/23/16 0331 06/24/16 0435  AST 30 40  --   --   ALT 12* 15  --   --   ALKPHOS 59 61  --   --   BILITOT 0.9 0.7  --   --   ALBUMIN 2.9* 2.2*  2.0* 1.8*    Cardiac Enzymes  Recent Labs Lab 06/21/16 0135 06/21/16 0442 06/21/16 0900  TROPONINI 0.04* 0.06* 0.06*    Glucose  Recent Labs Lab 06/23/16 1146 06/23/16 1534 06/23/16 1950 06/23/16 2342 06/24/16 0326 06/24/16 0758  GLUCAP 164* 148* 140* 135* 135* 127*    Imaging No results found.  STUDIES:  CT Head 9/27:  No acute abnormality. Chronic small vessel ischemic changes. Chronic lacunar infarct left basal ganglia & chronic ischemia right side of pons.  CT Abd/Pelvis w/o 9/27:  Early partial SBO w/ transition point LLQ with adhesions. Left humerus soft tissue swelling & intramuscular gas.  Left Arm X-ray 9/27:  Diffuse soft tissue edema.  TTE 9/27:  LV w/ EF 60-65%. Grade 1 Diastolic dysfunction. No regional wall motion abnormalities. LA & RA normal in size. RV normal in size & function. Mild AS w/o AR. Mild MR w/o MS.   MICROBIOLOGY: MRSA PCR 9/27:  Negative Blood Ctx x 2 9/26 >> Coag Neg Staph 1/2 Urine Ctx 9/26:  5000 CFU E coli & 1000 CFU Klebsiella   ANTIBIOTICS: Levaquin 9/26 - 9/28 Aztreonam 9/26 - 9/27 Vancomycin 9/26 >> Merrem 9/27 >>  SIGNIFICANT EVENTS: 9/26 - admit for presumed septic shock  LINES/TUBES: OETT 7.5 9/26 >> L IJ CVL 9/26 >> Foley 9/26 >> PIV x1  ASSESSMENT / PLAN:  PULMONARY A: Acute Hypoxic Respiratory Failure - Unable to protect airway with acute encephalopathy.  OHS  P:   Full Vent Support SBT as tolerates  CXR  CARDIOVASCULAR A:  Shock - Likely sepsis. Cortisol 45.1. Resolved. H/O HTN & HLD  P:  Levophed off Check CVP  RENAL A:   Acute On Chronic Renal Failure Stage IV - UOP steadily improving.  Profound Metabolic Acidosis - Corrected with Bicarb gtt. Improving pH on ABG. Hyperkalemia - Resolved.  Lactic Acidosis - Likely due to poor renal clearance. Level unchanged. Hyperphosphatemia - Secondary to acute renal failure. Improving. Hypocalcemia   P:   Monitoring UOP with Foley Trending  electrolytes & renal function daily Replacing electrolytes as indicated Dc Bicarb gtt  Nephrology Consult Repeat LA & ABG  GASTROINTESTINAL A:   Partial SBO H/O Ventral Hernia Repair Possible Liver Cirrhosis  P:   NPO Continuing OGT to low intermittent suction Pepcid IV daily  XR abdomen - if better start trickle feeds  HEMATOLOGIC A:   Anemia - Chronic. No signs of active bleeding. Hgb stable. Leukocytosis - Likely due to sepsis. Worsening slowly.  P:  Trending cell counts daily w/ CBC SCDs Heparin [place] q8hr  INFECTIOUS A:   Severe Sepsis Unlikely Coag Neg Staph Bacteremia  Probable UTI vs Enteric Translocation with SBO  P:  Trending Procalcitonin per algorithm Empiric Vancomycin Day & Merrem Day  Following cultures to completion  Holding home Trimethoprim  ENDOCRINE A:   Hypoglycemia - Resolved with Dextrose IVF. H/O DM Type 2 - A1c 5.8. H/O Hyperthyroidism - On PTU at home. TSH 0.331 & F T4 1.33.   P:   SSI per Sensitive Algorithm Accu-Checks q4hr w/ MD notification parameters Holding PTU & Metformin   NEUROLOGIC A:   Acute Encephalopathy - Question hepatic vs toxic metabolic vs hypoxia. No improvement in Lactulose Enema despite improved Ammonia. Sedation on Ventilator  H/O CVA - Minimal mobility.  P:   RASS goal: 0 to -1 Versed IV prn Sedation Fentanyl IV prn Sedation  MUSCULOSKELETAL A: Edema Left Arm - Improved. Secondary to IV infiltration. No gas on X-ray 9/27.  P: Monitor closely - no crepitus   FAMILY  - Updates: Daughter updated 9/29.   - Inter-disciplinary family meet or Palliative Care meeting due by:  10/2  TODAY'S SUMMARY:  65 y.o. female with hx stroke and severe debilitation presenting with AMS 9/26. Presumed septic shock likely from bacteremia and probable UTI. Cultures neg, Persistent lacatte concerning but no e/o bowel or limb  ischemia  Care during the described time interval was provided by me and/or other providers on  the critical care team.  I have reviewed this patient's available data, including medical history, events of note, physical examination and test results as part of my evaluation  CC time x  10m RKara MeadMD. FCCP. [place] [facility] Pager [phone] no response call [phone]     06/24/2016 8:08 AM

## 2016-06-25 LAB — CULTURE, BLOOD (ROUTINE X 2): Culture: NO GROWTH

## 2016-06-25 LAB — CBC WITH DIFFERENTIAL/PLATELET
BASOS ABS: 0 10*3/uL (ref 0.0–0.1)
BASOS PCT: 0 %
EOS PCT: 0 %
Eosinophils Absolute: 0 10*3/uL (ref 0.0–0.7)
HCT: 26.1 % — ABNORMAL LOW (ref 36.0–46.0)
Hemoglobin: 8.7 g/dL — ABNORMAL LOW (ref 12.0–15.0)
LYMPHS PCT: 6 %
Lymphs Abs: 1.3 10*3/uL (ref 0.7–4.0)
MCH: 27.1 pg (ref 26.0–34.0)
MCHC: 33.3 g/dL (ref 30.0–36.0)
MCV: 81.3 fL (ref 78.0–100.0)
Monocytes Absolute: 1.8 10*3/uL — ABNORMAL HIGH (ref 0.1–1.0)
Monocytes Relative: 8 %
NEUTROS ABS: 18.5 10*3/uL — AB (ref 1.7–7.7)
Neutrophils Relative %: 86 %
PLATELETS: 197 10*3/uL (ref 150–400)
RBC: 3.21 MIL/uL — AB (ref 3.87–5.11)
RDW: 14.5 % (ref 11.5–15.5)
WBC: 21.6 10*3/uL — AB (ref 4.0–10.5)

## 2016-06-25 LAB — BLOOD GAS, ARTERIAL
ACID-BASE DEFICIT: 3.7 mmol/L — AB (ref 0.0–2.0)
BICARBONATE: 20 mmol/L (ref 20.0–28.0)
Drawn by: 31101
FIO2: 0.4
LHR: 20 {breaths}/min
O2 Saturation: 92.9 %
PEEP/CPAP: 5 cmH2O
Patient temperature: 98.6
VT: 470 mL
pCO2 arterial: 31.4 mmHg — ABNORMAL LOW (ref 32.0–48.0)
pH, Arterial: 7.42 (ref 7.350–7.450)
pO2, Arterial: 71.5 mmHg — ABNORMAL LOW (ref 83.0–108.0)

## 2016-06-25 LAB — RENAL FUNCTION PANEL
ALBUMIN: 1.9 g/dL — AB (ref 3.5–5.0)
ANION GAP: 24 — AB (ref 5–15)
ANION GAP: 13 (ref 5–15)
Albumin: 2 g/dL — ABNORMAL LOW (ref 3.5–5.0)
BUN: 51 mg/dL — AB (ref 6–20)
BUN: 28 mg/dL — ABNORMAL HIGH (ref 6–20)
CHLORIDE: 102 mmol/L (ref 101–111)
CO2: 21 mmol/L — ABNORMAL LOW (ref 22–32)
CO2: 25 mmol/L (ref 22–32)
CREATININE: 2.18 mg/dL — AB (ref 0.44–1.00)
Calcium: 6.9 mg/dL — ABNORMAL LOW (ref 8.9–10.3)
Calcium: 7.3 mg/dL — ABNORMAL LOW (ref 8.9–10.3)
Chloride: 96 mmol/L — ABNORMAL LOW (ref 101–111)
Creatinine, Ser: 3.84 mg/dL — ABNORMAL HIGH (ref 0.44–1.00)
GFR, EST AFRICAN AMERICAN: 13 mL/min — AB (ref >60)
GFR, EST AFRICAN AMERICAN: 27 mL/min — AB (ref >60)
GFR, EST NON AFRICAN AMERICAN: 12 mL/min — AB (ref >60)
GFR, EST NON AFRICAN AMERICAN: 23 mL/min — AB (ref >60)
GLUCOSE: 85 mg/dL (ref 65–99)
Glucose, Bld: 122 mg/dL — ABNORMAL HIGH (ref 65–99)
POTASSIUM: 4.5 mmol/L (ref 3.5–5.1)
Phosphorus: 4 mg/dL (ref 2.5–4.6)
Phosphorus: 2.6 mg/dL (ref 2.5–4.6)
Potassium: 4.4 mmol/L (ref 3.5–5.1)
SODIUM: 141 mmol/L (ref 135–145)
Sodium: 140 mmol/L (ref 135–145)

## 2016-06-25 LAB — HEPATIC FUNCTION PANEL
ALT: 91 U/L — AB (ref 14–54)
Albumin: 1.9 g/dL — ABNORMAL LOW (ref 3.5–5.0)
Alkaline Phosphatase: 300 U/L — ABNORMAL HIGH (ref 38–126)
BILIRUBIN DIRECT: 0.2 mg/dL (ref 0.1–0.5)
BILIRUBIN INDIRECT: 1.1 mg/dL — AB (ref 0.3–0.9)
BILIRUBIN TOTAL: 1.3 mg/dL — AB (ref 0.3–1.2)
Total Protein: 5.2 g/dL — ABNORMAL LOW (ref 6.5–8.1)

## 2016-06-25 LAB — GLUCOSE, CAPILLARY
GLUCOSE-CAPILLARY: 111 mg/dL — AB (ref 65–99)
GLUCOSE-CAPILLARY: 70 mg/dL (ref 65–99)
GLUCOSE-CAPILLARY: 77 mg/dL (ref 65–99)
Glucose-Capillary: 91 mg/dL (ref 65–99)
Glucose-Capillary: 97 mg/dL (ref 65–99)
Glucose-Capillary: 118 mg/dL — ABNORMAL HIGH (ref 65–99)

## 2016-06-25 LAB — LACTIC ACID, PLASMA
Lactic Acid, Venous: 9.2 mmol/L (ref 0.5–1.9)
Lactic Acid, Venous: 3.9 mmol/L (ref 0.5–1.9)

## 2016-06-25 LAB — HCV COMMENT:

## 2016-06-25 LAB — HEPATITIS B SURFACE ANTIGEN: Hepatitis B Surface Ag: NEGATIVE

## 2016-06-25 LAB — APTT: aPTT: 147 seconds — ABNORMAL HIGH (ref 24–36)

## 2016-06-25 LAB — MAGNESIUM: Magnesium: 2.3 mg/dL (ref 1.7–2.4)

## 2016-06-25 LAB — HEPATITIS C ANTIBODY (REFLEX): HCV Ab: <0.1 s/co ratio (ref 0.0–0.9)

## 2016-06-25 LAB — PROTIME-INR
INR: 1.76
PROTHROMBIN TIME: 20.8 s — AB (ref 11.4–15.2)

## 2016-06-25 LAB — PARATHYROID HORMONE, INTACT (NO CA): PTH: 520 pg/mL — ABNORMAL HIGH (ref 15–65)

## 2016-06-25 LAB — HEPATITIS B SURFACE ANTIBODY,QUALITATIVE: Hep B S Ab: NONREACTIVE

## 2016-06-25 MED ORDER — VITAL HIGH PROTEIN PO LIQD
1000.0000 mL | ORAL | Status: DC
  Administered 2016-06-25 – 2016-06-28 (×3): 1000 mL

## 2016-06-25 MED ORDER — WHITE PETROLATUM GEL
Status: AC
  Administered 2016-06-25: 12:00:00
  Filled 2016-06-25: qty 1

## 2016-06-25 NOTE — Progress Notes (Signed)
Subjective: Interval History: tol CRRT, still entub, on AB  Objective: Vital signs in last 24 hours: Temp:  [94.2 F (34.6 C)-99 F (37.2 C)] 97.7 F (36.5 C) (10/01 0418) Pulse Rate:  [80-106] 88 (10/01 0700) Resp:  [16-35] 20 (10/01 0700) BP: (104-144)/(46-69) 142/61 (10/01 0700) SpO2:  [99 %-100 %] 100 % (10/01 0700) FiO2 (%):  [40 %] 40 % (10/01 0600) Weight:  [116.6 kg (257 lb 0.9 oz)] 116.6 kg (257 lb 0.9 oz) (10/01 0423) Weight change: -0.7 kg (-1 lb 8.7 oz)  Intake/Output from previous day: 09/30 0701 - 10/01 0700 In: 3180 [I.V.:2780; IV Piggyback:400] Out: 3227 [Urine:707] Intake/Output this shift: Total I/O In: 13.2 [I.V.:13.2] Out: -   General appearance: morbidly obese and as below Neck: morbidly obese and open eyes but not coop Resp: rhonchi bilaterally Cardio: S1, S2 normal and systolic murmur: holosystolic 2/6, blowing at apex GI: obese, striae, has bs, soft Extremities: edema 2+  Lab Results:  Recent Labs  06/24/16 0435 06/25/16 0400  WBC 19.5* 21.6*  HGB 8.2* 8.7*  HCT 24.5* 26.1*  PLT 200 197   BMET:  Recent Labs  06/24/16 1600 06/25/16 0400  NA 139 141  K 4.6 4.5  CL 81* 96*  CO2 16* 21*  GLUCOSE 95 85  BUN 96* 51*  CREATININE 6.62* 3.84*  CALCIUM 6.3* 6.9*   No results for input(s): PTH in the last 72 hours. Iron Studies: No results for input(s): IRON, TIBC, TRANSFERRIN, FERRITIN in the last 72 hours.  Studies/Results: Dg Abd 1 View  Result Date: 06/24/2016 CLINICAL DATA:  Small bowel obstruction. EXAM: ABDOMEN - 1 VIEW COMPARISON:  June 22, 2016 FINDINGS: Small bowel remains dilated out of proportion to colonic gas measuring up to 4.3 cm, mildly improved in the interval. Minimal colonic gas in the colon. Large calcification in the right side of pelvis is likely a calcified uterine fibroid. NG tube terminates in the distal stomach. No other acute abnormalities. IMPRESSION: 1. Persistent but mildly improved small bowel  obstruction. 2. Calcified uterine fibroid. Electronically Signed   By: Dorise Bullion III M.D   On: 06/24/2016 10:19   US Renal  Result Date: 06/24/2016 CLINICAL DATA:  65 year old female with history of acute kidney injury. Hypertension. EXAM: RENAL / URINARY TRACT ULTRASOUND COMPLETE COMPARISON:  Abdominal ultrasound 08/03/2015. FINDINGS: Right Kidney: Length: 13.0 cm. Echogenicity within normal limits. No mass or hydronephrosis visualized. Left Kidney: Length: 12.7 cm. Echogenicity within normal limits. 1.9 x 1.5 x 1.6 cm anechoic lesion with increased through transmission in the lower pole of the left kidney, compatible with a small simple cyst. No hydronephrosis visualized. Bladder: Appears normal for degree of bladder distention. IMPRESSION: 1. Diffusely increased cortical echogenicity in the right kidney, concerning for medical renal disease. 2. No hydronephrosis. 3. Small simple cysts in the lower pole of the left kidney again noted. Electronically Signed   By: Vinnie Langton M.D.   On: 06/24/2016 16:26   Dg Chest Port 1 View  Result Date: 06/24/2016 CLINICAL DATA:  Evaluate central line placement. EXAM: PORTABLE CHEST 1 VIEW COMPARISON:  June 24, 2016 FINDINGS: The ETT, left central line, and NG tube are in good position. No pneumothorax. Elevation of the right hemidiaphragm remains. Cardiomediastinal silhouette is stable. Scattered bilateral pulmonary opacities are stable. IMPRESSION: Mild bilateral pulmonary opacities are stable. No change in support apparatus. No pneumothorax. Electronically Signed   By: Dorise Bullion III M.D   On: 06/24/2016 13:52   Dg Chest Port 1 [address]  Result Date: 06/24/2016 CLINICAL DATA:  Respiratory failure EXAM: PORTABLE CHEST 1 VIEW COMPARISON:  06/20/2016 FINDINGS: Endotracheal tube tip 1.3 cm from the carina. NG tube placed. It is coiled in the stomach. Tip is beyond the lower limit of the image. Left jugular venous catheter stable. The heart is mildly  enlarged. Vascular congestion has increased. Hazy central basilar airspace disease left greater than right has developed. No pneumothorax. IMPRESSION: Endotracheal tube advanced. NG tube placed. Increasing bilateral central basilar airspace disease left greater than right worrisome for pulmonary edema. Electronically Signed   By: Marybelle Killings M.D.   On: 06/24/2016 10:16    I have reviewed the patient's current medications.  Assessment/Plan: 1 AKI has some urine not a lot.  U/s without obstruct.  Urine chem not revealing.  Vol xs, acidemia better with narrowing of AG, but still some xs lactate. K and phos ok. 2 Sepsis ?? Cause ? Asp 3 DM controlled 4 Obesity 5 Resp failure per CCM 6 CVA 7 Anemia check Fe 8 Debill P CRRT, follow chem, ^ net neg.  , AB, vent per CCM   LOS: 5 days   Kesha Hurrell L 06/25/2016,7:23 AM

## 2016-06-25 NOTE — Progress Notes (Signed)
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 She lives at home with her daughter where she requires full assistance she is unable to truly ambulate and spends most of her time in a hospital bed/recliner. She relies on family members almost entirely for activities of daily living. She was in her usual state of health until about 9/23 when she developed nausea and vomiting. This persisted for 2 days until symptoms subsided, however, she continued to complain of abdominal pain. 9/26 when the patient's daughter got home from work the patient was found to be unresponsive. EMS was called and she was found hypoglycemic which was corrected, however, altered mental status did not improve. Upon arrival to the emergency department she continued to be lethargic and was found to be hypotensive with hypoxemia. Laboratory evaluation significant for potassium 7.4, bicarbonate less than 7, creatinine 6.51, BUN 95, WBC 12.3, lactic acid 13.88, pH 7.07.  SUBJECTIVE:remains critically ill , intubated, on CRRT Off vasopressors  UO decreasing afebrile  REVIEW OF SYSTEMS:  Unable to obtain with intubation & sedation.   VITAL SIGNS: BP (!) 151/65   Pulse 90   Temp 97.3 F (36.3 C) (Oral)   Resp (!) 22   Ht '5\' 6"'$  (1.676 m)   Wt 257 lb 0.9 oz (116.6 kg)   SpO2 100%   BMI 41.49 kg/m   HEMODYNAMICS: CVP:  [4 mmHg-5 mmHg] 5 mmHg  VENTILATOR SETTINGS: Vent Mode: PSV;CPAP FiO2 (%):  [40 %] 40 % Set Rate:  [20 bmp] 20 bmp Vt Set:  [470 mL] 470 mL PEEP:  [5 cmH20] 5  cmH20 Pressure Support:  [5 cmH20] 5 cmH20 Plateau Pressure:  [17 cmH20-18 cmH20] 18 cmH20  INTAKE / OUTPUT: I/O last 3 completed shifts: In: 5270 [I.V.:4700; IV Piggyback:570] Out: 4132 [GMWNU:2725; Other:2520]  PHYSICAL EXAMINATION: General:  Obese female.  Chr ill appearing Neuro: opens eyes to name, Not following commands. Withdraws to pain in extremities. HEENT:  No scleral icterus or injection. ETT in place. Moist mucus membranes.crusted bleeding Cardiovascular:  Regular rhythm and rate. No edema. Unable to appreciate JVD. Lungs:  Clear bilaterally to auscultation. Symmetric chest rise on ventilator.  Abdomen:  Soft. Protuberant. Hypoactive BS. Musculoskeletal:  No joint deformity or effusion. No crepitus Skin:  Warm & dry. No rash on exposed skin. No erythema in left arm.   LABS:  BMET  Recent Labs Lab 06/24/16 0435 06/24/16 1600 06/25/16 0400  NA 143 139 141  K 4.9 4.6 4.5  CL 82* 81* 96*  CO2 17* 16* 21*  BUN 96* 96* 51*  CREATININE 6.88* 6.62* 3.84*  GLUCOSE 138* 95 85    Electrolytes  Recent Labs Lab 06/23/16 0330  06/24/16 0435 06/24/16 1600 06/25/16 0400  CALCIUM  --   < > 6.1* 6.3* 6.9*  MG 2.5*  --  2.5*  --  2.3  PHOS  --   < > 7.2* 7.1* 4.0  < > = values in this interval not displayed.  CBC  Recent Labs Lab 06/23/16 0330 06/24/16 0435 06/25/16 0400  WBC 15.9* 19.5* 21.6*  HGB 8.5* 8.2*  8.7*  HCT 26.1* 24.5* 26.1*  PLT 224 200 197    Coag's  Recent Labs Lab 06/21/16 0135 06/25/16 0400 06/25/16 0650  APTT  --  147*  --   INR 1.85  --  1.76    Sepsis Markers  Recent Labs Lab 06/21/16 0135  06/24/16 0437 06/24/16 1618 06/25/16 0415  LATICACIDVEN  --   < > 20.9* 20.1* 9.2*  PROCALCITON 5.09  --   --   --   --   < > = values in this interval not displayed.  ABG  Recent Labs Lab 06/24/16 0400 06/24/16 1605 06/25/16 0355  PHART 7.492* 7.486* 7.420  PCO2ART 22.8* 23.2* 31.4*  PO2ART 154* 170* 71.5*    Liver  Enzymes  Recent Labs Lab 06/20/16 2058 06/22/16 0333  06/24/16 1600 06/25/16 0400 06/25/16 0650  AST 30 40  --   --   --  <5*  ALT 12* 15  --   --   --  91*  ALKPHOS 59 61  --   --   --  300*  BILITOT 0.9 0.7  --   --   --  1.3*  ALBUMIN 2.9* 2.2*  < > 1.8* 1.9* 1.9*  < > = values in this interval not displayed.  Cardiac Enzymes  Recent Labs Lab 06/21/16 0135 06/21/16 0442 06/21/16 0900  TROPONINI 0.04* 0.06* 0.06*    Glucose  Recent Labs Lab 06/24/16 1213 06/24/16 1614 06/24/16 2008 06/25/16 0008 06/25/16 0417 06/25/16 0805  GLUCAP 132* 84 76 70 77 91    Imaging US Renal  Result Date: 06/24/2016 CLINICAL DATA:  66 year old female with history of acute kidney injury. Hypertension. EXAM: RENAL / URINARY TRACT ULTRASOUND COMPLETE COMPARISON:  Abdominal ultrasound 08/03/2015. FINDINGS: Right Kidney: Length: 13.0 cm. Echogenicity within normal limits. No mass or hydronephrosis visualized. Left Kidney: Length: 12.7 cm. Echogenicity within normal limits. 1.9 x 1.5 x 1.6 cm anechoic lesion with increased through transmission in the lower pole of the left kidney, compatible with a small simple cyst. No hydronephrosis visualized. Bladder: Appears normal for degree of bladder distention. IMPRESSION: 1. Diffusely increased cortical echogenicity in the right kidney, concerning for medical renal disease. 2. No hydronephrosis. 3. Small simple cysts in the lower pole of the left kidney again noted. Electronically Signed   By: Vinnie Langton M.D.   On: 06/24/2016 16:26   Dg Chest Port 1 View  Result Date: 06/24/2016 CLINICAL DATA:  Evaluate central line placement. EXAM: PORTABLE CHEST 1 VIEW COMPARISON:  June 24, 2016 FINDINGS: The ETT, left central line, and NG tube are in good position. No pneumothorax. Elevation of the right hemidiaphragm remains. Cardiomediastinal silhouette is stable. Scattered bilateral pulmonary opacities are stable. IMPRESSION: Mild bilateral pulmonary  opacities are stable. No change in support apparatus. No pneumothorax. Electronically Signed   By: Dorise Bullion III M.D   On: 06/24/2016 13:52    STUDIES:  CT Head 9/27:  No acute abnormality. Chronic small vessel ischemic changes. Chronic lacunar infarct left basal ganglia & chronic ischemia right side of pons.  CT Abd/Pelvis w/o 9/27:  Early partial SBO w/ transition point LLQ with adhesions. Left humerus soft tissue swelling & intramuscular gas.  Left Arm X-ray 9/27:  Diffuse soft tissue edema.  TTE 9/27:  LV w/ EF 60-65%. Grade 1 Diastolic dysfunction. No regional wall motion abnormalities. LA & RA normal in size. RV normal in size & function. Mild AS w/o AR. Mild MR w/o MS.   MICROBIOLOGY: MRSA PCR  9/27:  Negative Blood Ctx x 2 9/26 >> Coag Neg Staph 1/2 Urine Ctx 9/26:  5000 CFU E coli & 1000 CFU Klebsiella   ANTIBIOTICS: Levaquin 9/26 - 9/28 Aztreonam 9/26 - 9/27 Vancomycin 9/26 >> Merrem 9/27 >>  SIGNIFICANT EVENTS: 9/26 - admit for presumed septic shock  LINES/TUBES: OETT 7.5 9/26 >> L IJ CVL 9/26 >> Foley 9/26 >> RIJ HD 9/30 >>  ASSESSMENT / PLAN:  PULMONARY A: Acute Hypoxic Respiratory Failure - Unable to protect airway with acute encephalopathy.  OHS  P:   SBT as tolerates  Hold off extubation until fully awake   CARDIOVASCULAR A:  Shock - Likely sepsis. Cortisol 45.1. Resolved. H/O HTN & HLD  P:  Levophed off   RENAL A:   Acute On Chronic Renal Failure Stage IV - UOP steadily improving.  Profound Metabolic Acidosis - Corrected with Bicarb gtt. Improving pH on ABG. Hyperkalemia - Resolved.  Lactic Acidosis - Likely due to poor renal clearance. Level unchanged. Hyperphosphatemia - Secondary to acute renal failure. Improving. Hypocalcemia   P:   Monitoring UOP with Foley Trending electrolytes & renal function daily Replacing electrolytes as indicated CRRT per Nephrology  Repeat LA for clearance  GASTROINTESTINAL A:   Partial SBO  -resolving on XR abd 9/30 H/O Ventral Hernia Repair Possible Liver Cirrhosis  P:   Start trickle feeds Pepcid IV daily    HEMATOLOGIC A:   Anemia - Chronic. No signs of active bleeding. Hgb stable. Leukocytosis - Likely due to sepsis. Worsening slowly.  P:  Trending cell counts daily w/ CBC SCDs Heparin [place] q8hr  INFECTIOUS A:   Severe Sepsis Unlikely Coag Neg Staph Bacteremia  Probable UTI vs Enteric Translocation with SBO  P:   Trending Procalcitonin per algorithm Empiric Vancomycin Day & Merrem Day  Following cultures to completion  Holding home Trimethoprim  ENDOCRINE A:   Hypoglycemia - Resolved with Dextrose IVF. H/O DM Type 2 - A1c 5.8. H/O Hyperthyroidism - On PTU at home. TSH 0.331 & F T4 1.33.   P:   SSI per Sensitive Algorithm Accu-Checks q4hr w/ MD notification parameters Holding PTU & Metformin   NEUROLOGIC A:   Acute Encephalopathy - Question hepatic vs toxic metabolic vs hypoxia. No improvement in Lactulose Enema despite improved Ammonia. Sedation on Ventilator  H/O CVA - Minimal mobility.  P:   RASS goal: 0 to -1 Versed IV prn Sedation Fentanyl IV prn Sedation  MUSCULOSKELETAL A: Edema Left Arm - Improved. Secondary to IV infiltration. No gas on X-ray 9/27.  P: Monitor closely - no crepitus   FAMILY  - Updates: Daughter updated 10/1.   - Inter-disciplinary family meet or Palliative Care meeting due by:  10/2  TODAY'S SUMMARY:  65 y.o. female with hx stroke and severe debilitation presenting with AMS 9/26. Presumed septic shock likely from bacteremia and probable UTI. Cultures neg, Persistent lactate concerning but no e/o bowel or limb  Ischemia ? Slow clearance  Care during the described time interval was provided by me and/or other providers on the critical care team.  I have reviewed this patient's available data, including medical history, events of note, physical examination and test results as part of my evaluation  CC time x   25m RKara MeadMD. FCCP. [place] [facility] Pager [phone] no response call [phone]     06/25/2016 10:20 AM

## 2016-06-26 ENCOUNTER — Inpatient Hospital Stay (HOSPITAL_COMMUNITY): Payer: Medicare Other

## 2016-06-26 DIAGNOSIS — R109 Unspecified abdominal pain: Secondary | ICD-10-CM

## 2016-06-26 LAB — RENAL FUNCTION PANEL
ANION GAP: 10 (ref 5–15)
Albumin: 1.9 g/dL — ABNORMAL LOW (ref 3.5–5.0)
Albumin: 1.8 g/dL — ABNORMAL LOW (ref 3.5–5.0)
Anion gap: 12 (ref 5–15)
BUN: 21 mg/dL — ABNORMAL HIGH (ref 6–20)
BUN: 14 mg/dL (ref 6–20)
CALCIUM: 7.8 mg/dL — AB (ref 8.9–10.3)
CHLORIDE: 103 mmol/L (ref 101–111)
CO2: 24 mmol/L (ref 22–32)
CO2: 25 mmol/L (ref 22–32)
CREATININE: 1.63 mg/dL — AB (ref 0.44–1.00)
Calcium: 7.8 mg/dL — ABNORMAL LOW (ref 8.9–10.3)
Chloride: 102 mmol/L (ref 101–111)
Creatinine, Ser: 1.37 mg/dL — ABNORMAL HIGH (ref 0.44–1.00)
GFR calc Af Amer: 38 mL/min — ABNORMAL LOW (ref >60)
GFR calc Af Amer: 46 mL/min — ABNORMAL LOW (ref >60)
GFR calc non Af Amer: 33 mL/min — ABNORMAL LOW (ref >60)
GFR calc non Af Amer: 40 mL/min — ABNORMAL LOW (ref >60)
GLUCOSE: 140 mg/dL — AB (ref 65–99)
GLUCOSE: 145 mg/dL — AB (ref 65–99)
POTASSIUM: 4.3 mmol/L (ref 3.5–5.1)
Phosphorus: 2.1 mg/dL — ABNORMAL LOW (ref 2.5–4.6)
Phosphorus: 1.5 mg/dL — ABNORMAL LOW (ref 2.5–4.6)
Potassium: 4.4 mmol/L (ref 3.5–5.1)
SODIUM: 138 mmol/L (ref 135–145)
Sodium: 138 mmol/L (ref 135–145)

## 2016-06-26 LAB — CBC WITH DIFFERENTIAL/PLATELET
BASOS ABS: 0 10*3/uL (ref 0.0–0.1)
BASOS PCT: 0 %
EOS ABS: 0 10*3/uL (ref 0.0–0.7)
Eosinophils Relative: 0 %
HCT: 26.7 % — ABNORMAL LOW (ref 36.0–46.0)
Hemoglobin: 8.7 g/dL — ABNORMAL LOW (ref 12.0–15.0)
LYMPHS PCT: 2 %
Lymphs Abs: 0.5 10*3/uL — ABNORMAL LOW (ref 0.7–4.0)
MCH: 26.9 pg (ref 26.0–34.0)
MCHC: 32.6 g/dL (ref 30.0–36.0)
MCV: 82.4 fL (ref 78.0–100.0)
Monocytes Absolute: 2.1 10*3/uL — ABNORMAL HIGH (ref 0.1–1.0)
Monocytes Relative: 8 %
NEUTROS PCT: 90 %
Neutro Abs: 23.6 10*3/uL — ABNORMAL HIGH (ref 1.7–7.7)
PLATELETS: 178 10*3/uL (ref 150–400)
RBC: 3.24 MIL/uL — AB (ref 3.87–5.11)
RDW: 15.3 % (ref 11.5–15.5)
WBC: 26.2 10*3/uL — ABNORMAL HIGH (ref 4.0–10.5)

## 2016-06-26 LAB — MAGNESIUM: MAGNESIUM: 2.5 mg/dL — AB (ref 1.7–2.4)

## 2016-06-26 LAB — GLUCOSE, CAPILLARY
GLUCOSE-CAPILLARY: 112 mg/dL — AB (ref 65–99)
GLUCOSE-CAPILLARY: 124 mg/dL — AB (ref 65–99)
GLUCOSE-CAPILLARY: 132 mg/dL — AB (ref 65–99)
GLUCOSE-CAPILLARY: 133 mg/dL — AB (ref 65–99)
GLUCOSE-CAPILLARY: 145 mg/dL — AB (ref 65–99)
Glucose-Capillary: 141 mg/dL — ABNORMAL HIGH (ref 65–99)
Glucose-Capillary: 151 mg/dL — ABNORMAL HIGH (ref 65–99)

## 2016-06-26 LAB — LACTIC ACID, PLASMA: LACTIC ACID, VENOUS: 1.8 mmol/L (ref 0.5–1.9)

## 2016-06-26 LAB — APTT: APTT: 175 s — AB (ref 24–36)

## 2016-06-26 MED ORDER — DARBEPOETIN ALFA 100 MCG/0.5ML IJ SOSY
100.0000 ug | PREFILLED_SYRINGE | INTRAMUSCULAR | Status: DC
  Administered 2016-06-26 – 2016-07-03 (×2): 100 ug via INTRAVENOUS
  Filled 2016-06-26 (×4): qty 0.5

## 2016-06-26 NOTE — Progress Notes (Signed)
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 She lives at home with her daughter where she requires full assistance she is unable to truly ambulate and spends most of her time in a hospital bed/recliner. She relies on family members almost entirely for activities of daily living. She was in her usual state of health until about 9/23 when she developed nausea and vomiting. This persisted for 2 days until symptoms subsided, however, she continued to complain of abdominal pain. 9/26 when the patient's daughter got home from work the patient was found to be unresponsive. EMS was called and she was found hypoglycemic which was corrected, however, altered mental status did not improve. Upon arrival to the emergency department she continued to be lethargic and was found to be hypotensive with hypoxemia. Laboratory evaluation significant for potassium 7.4, bicarbonate less than 7, creatinine 6.51, BUN 95, WBC 12.3, lactic acid 13.88, pH 7.07.  SUBJECTIVE:  Remains intubated and on CRRT.  Not awake enough to assess for extubation (note, has not required any sedation).   VITAL SIGNS: BP (!) 113/55   Pulse 93   Temp 98.2 F (36.8 C) (Oral)   Resp (!) 24   Ht '5\' 6"'$  (1.676 m)   Wt 244 lb 14.9 oz (111.1 kg)   SpO2 100%   BMI 39.53 kg/m   HEMODYNAMICS: CVP:  [6 mmHg-9 mmHg] 9 mmHg  VENTILATOR SETTINGS: Vent Mode: PRVC FiO2 (%):  [40 %] 40 % Set Rate:  [20 bmp] 20 bmp Vt Set:  [470 mL] 470 mL PEEP:  [5 cmH20] 5 cmH20 Pressure Support:  [5 cmH20] 5  cmH20 Plateau Pressure:  [15 cmH20-20 cmH20] 18 cmH20  INTAKE / OUTPUT: I/O last 3 completed shifts: In: 2658.2 [I.V.:1808.2; NG/GT:450; IV Piggyback:400] Out: 7829 [Urine:107; FAOZH:0865]  PHYSICAL EXAMINATION: General:  Obese female.  Chronically ill appearing. Neuro: Opens eyes to noxious stimuli, does not follow commands. HEENT:  No scleral icterus or injection. ETT in place. Moist mucus membranes though dried blood noted on lips and in oral cavity. Cardiovascular:  Regular rhythm and rate. No edema. Unable to appreciate JVD. Lungs:  Coarse bilaterally.  Symmetric chest rise on ventilator.  Abdomen:  Soft. Protuberant. Hypoactive BS. Musculoskeletal:  No joint deformity or effusion. No crepitus. Skin:  Warm & dry. No rash on exposed skin. No erythema in left arm.   LABS:  BMET  Recent Labs Lab 06/25/16 0400 06/25/16 1704 06/26/16 0413  NA 141 140 138  K 4.5 4.4 4.4  CL 96* 102 102  CO2 21* 25 24  BUN 51* 28* 21*  CREATININE 3.84* 2.18* 1.63*  GLUCOSE 85 122* 140*    Electrolytes  Recent Labs Lab 06/24/16 0435  06/25/16 0400 06/25/16 1704 06/26/16 0413  CALCIUM 6.1*  < > 6.9* 7.3* 7.8*  MG 2.5*  --  2.3  --  2.5*  PHOS 7.2*  < > 4.0 2.6 2.1*  < > = values in this interval not displayed.  CBC  Recent Labs Lab 06/24/16 0435 06/25/16 0400 06/26/16 0413  WBC 19.5* 21.6* 26.2*  HGB 8.2* 8.7* 8.7*  HCT 24.5* 26.1* 26.7*  PLT 200 197 178    Coag's  Recent Labs Lab 06/21/16 0135 06/25/16 0400 06/25/16 0650 06/26/16 0413  APTT  --  147*  --  175*  INR 1.85  --  1.76  --     Sepsis Markers  Recent Labs Lab 06/21/16 0135  06/25/16 0415 06/25/16 1302 06/26/16 0413  LATICACIDVEN  --   < > 9.2* 3.9* 1.8  PROCALCITON 5.09  --   --   --   --   < > = values in this interval not displayed.  ABG  Recent Labs Lab 06/24/16 0400 06/24/16 1605 06/25/16 0355  PHART 7.492* 7.486* 7.420  PCO2ART 22.8* 23.2* 31.4*  PO2ART 154* 170* 71.5*     Liver Enzymes  Recent Labs Lab 06/20/16 2058 06/22/16 0333  06/25/16 0650 06/25/16 1704 06/26/16 0413  AST 30 40  --  <5*  --   --   ALT 12* 15  --  91*  --   --   ALKPHOS 59 61  --  300*  --   --   BILITOT 0.9 0.7  --  1.3*  --   --   ALBUMIN 2.9* 2.2*  < > 1.9* 2.0* 1.9*  < > = values in this interval not displayed.  Cardiac Enzymes  Recent Labs Lab 06/21/16 0135 06/21/16 0442 06/21/16 0900  TROPONINI 0.04* 0.06* 0.06*    Glucose  Recent Labs Lab 06/25/16 1218 06/25/16 1611 06/25/16 1959 06/26/16 0002 06/26/16 0421 06/26/16 0804  GLUCAP 97 111* 118* 112* 133* 124*    Imaging Dg Chest Port 1 View  Result Date: 06/26/2016 CLINICAL DATA:  Hypoxia EXAM: PORTABLE CHEST 1 VIEW COMPARISON:  June 24, 2016 FINDINGS: The endotracheal tube tip is at the carina. Central catheter tip is at the cavoatrial junction. Nasogastric tube tip and side port are in the distal stomach. No pneumothorax. There is no edema or consolidation. Heart size and pulmonary vascularity are normal. No adenopathy. There is atherosclerotic calcification in the aorta. IMPRESSION: Endotracheal tube at carina. Advise withdrawing 2-3 cm. No edema or consolidation. Stable cardiac silhouette. There is aortic atherosclerosis. These results will be called to the ordering clinician or representative by the Radiologist Assistant, and communication documented in the PACS or zVision Dashboard. Electronically Signed   By: Lowella Grip III M.D.   On: 06/26/2016 07:13    STUDIES:  CT Head 9/27:  No acute abnormality. Chronic small vessel ischemic changes. Chronic lacunar infarct left basal ganglia & chronic ischemia right side of pons.  CT Abd/Pelvis w/o 9/27:  Early partial SBO w/ transition point LLQ with adhesions. Left humerus soft tissue swelling & intramuscular gas.  Left Arm X-ray 9/27:  Diffuse soft tissue edema.  TTE 9/27:  LV w/ EF 60-65%. Grade 1 Diastolic dysfunction. No regional wall motion  abnormalities. LA & RA normal in size. RV normal in size & function. Mild AS w/o AR. Mild MR w/o MS.   MICROBIOLOGY: MRSA PCR 9/27:  Negative Blood Ctx x 2 9/26 >> Coag Neg Staph 1/2 Urine Ctx 9/26:  5000 CFU E coli & 1000 CFU Klebsiella   ANTIBIOTICS: Levaquin 9/26 - 9/28 Aztreonam 9/26 - 9/27 Vancomycin 9/26 >> Merrem 9/27 >>  SIGNIFICANT EVENTS: 9/26 - admit for presumed septic shock  LINES/TUBES: OETT 7.5 9/26 >> L IJ CVL 9/26 >> Foley 9/26 >> RIJ HD 9/30 >>  ASSESSMENT / PLAN:  PULMONARY A: Acute Hypoxic Respiratory Failure - Unable to protect airway with  acute encephalopathy.  OHS P:   SBT as tolerates, not awake enough this AM to consider extubation however. CXR intermittently.  CARDIOVASCULAR A:  Shock - Likely sepsis. Cortisol 45.1. Resolved. H/O HTN & HLD P:  Monitor hemodynamics.  RENAL A:   Acute On Chronic Renal Failure Stage IV - UOP steadily improving.  Profound Metabolic Acidosis - Corrected with Bicarb gtt. Improving pH on ABG. Hyperkalemia - Resolved.  Lactic Acidosis - Likely due to poor renal clearance.  Resolved 10/2 after CRRT. Hyperphosphatemia - Secondary to acute renal failure. Improving. Pseudohypocalcemia - corrects to 9.48. P:   Monitoring UOP with Foley (has not had any today). Continue CRRT per Nephrology. BMP in AM.  GASTROINTESTINAL A:   Partial SBO - resolving on XR abd 9/30. H/O Ventral Hernia Repair. Possible Liver Cirrhosis. Transaminitis. P:   Start trickle feeds. Pepcid IV daily.   HEMATOLOGIC A:   Anemia - Chronic. No signs of active bleeding. Hgb stable. Leukocytosis - Likely due to sepsis. Worsening slowly. P:  SCD's / Heparin. CBC in AM.  INFECTIOUS A:   Severe Sepsis - due to E.coli and Klebsiella UTI. Unlikely Coag Neg Staph Bacteremia  P:   Trending Procalcitonin per algorithm. Continue empiric meropenem. Following cultures to completion. Holding home Trimethoprim.  ENDOCRINE A:    Hypoglycemia - Resolved with Dextrose IVF. H/O DM Type 2 - A1c 5.8. H/O Hyperthyroidism - On PTU at home. TSH 0.331 & F T4 1.33.  P:   SSI per Sensitive Algorithm. Holding PTU & Metformin.  NEUROLOGIC A:   Acute Encephalopathy - Question hepatic vs toxic metabolic vs hypoxia. No improvement in Lactulose Enema despite improved Ammonia.  Favor toxic metabolic as is gradually improving with CRRT. H/O CVA - Minimal mobility. P:   RASS goal: 0 to -1 Avoid sedating meds.  MUSCULOSKELETAL A: Edema Left Arm - Improved. Secondary to IV infiltration. No gas on X-ray 9/27. P: Monitor closely - no crepitus   FAMILY  - Updates: None available 10/2.  - Inter-disciplinary family meet or Palliative Care meeting due by:  10/2   CC time: 40 minutes.   Montey Hora, [facility] Pager: [phone]  or [phone] 06/26/2016, 11:49 AM  Attending Note:  I have examined patient, reviewed labs, studies and notes. I have discussed the case with Jun[place], and I agree with the data and plans as amended above. 65 yo woman, acute resp failure, shock, suspected UTI/ HCAP c/b hepatic injury and renal failure, severe lactic acidosis. On my eval she remains confused, is tolerating CVVHD. She is on SBT now but does not have MS for extubation. Lactate has improved with HD. We will plan HD for another 24h, allow MS to clear. Hopefully extubate on 10/3. Independent critical care time is 33 minutes.   Baltazar Apo, MD, PhD 06/26/2016, 12:00 PM [facility] [phone] or if no answer [phone]

## 2016-06-26 NOTE — Progress Notes (Signed)
Subjective:  BP soft at times, no pressors, no UOP- CRRT running well  Objective Vital signs in last 24 hours: Vitals:   06/26/16 0422 06/26/16 0500 06/26/16 0600 06/26/16 0700  BP:  (!) 116/53 (!) 118/58 (!) 113/55  Pulse:  88 94 93  Resp:  (!) 23 (!) 22 (!) 24  Temp: 97.6 F (36.4 C)     TempSrc: Oral     SpO2:  100% 100% 100%  Weight:      Height:       Weight change: -5.5 kg (-12 lb 2 oz)  Intake/Output Summary (Last 24 hours) at 06/26/16 0724 Last data filed at 06/26/16 0700  Gross per 24 hour  Intake             1225 ml  Output             3670 ml  Net            -2445 ml    Assessment/ Plan: Pt is a 65 y.o. yo female with CVA, DM, indwelling foley and severely debilitated at baseline- creatinine 1.55 ? Although AKI in past - crt in the high 2's in November 2016- who was admitted on 06/20/2016 with  Shock/sepsis, lactic acidosis with A on CRF Assessment/Plan: 1. Renal- A on CRF- on CRRT since 9/30- machine running well - good clearance- have asked nursing to let me know if machine clots as may want to stop CRRT today- but since no UOP will keep it going at max another 24 hours- if still looks good tomorrow will stop.  In looking at history not sure if patient is a great candidate for chronic HD  2. Shock- per CCM- thought to be sepsis- now on meropenam - previous vanc- seems to be improving   3. Anemia- hgb 8's but stable- add ESA  4. HTN/volume- overloaded/third spacing but also CVP 9-10- tolerating 100 per hour volume off- continue for now since is 10 liters positive  Ellison Rieth A    Labs: Basic Metabolic Panel:  Recent Labs Lab 06/25/16 0400 06/25/16 1704 06/26/16 0413  NA 141 140 138  K 4.5 4.4 4.4  CL 96* 102 102  CO2 21* 25 24  GLUCOSE 85 122* 140*  BUN 51* 28* 21*  CREATININE 3.84* 2.18* 1.63*  CALCIUM 6.9* 7.3* 7.8*  PHOS 4.0 2.6 2.1*   Liver Function Tests:  Recent Labs Lab 06/20/16 2058 06/22/16 0333  06/25/16 0650 06/25/16 1704  06/26/16 0413  AST 30 40  --  <5*  --   --   ALT 12* 15  --  91*  --   --   ALKPHOS 59 61  --  300*  --   --   BILITOT 0.9 0.7  --  1.3*  --   --   PROT 6.7 5.2*  --  5.2*  --   --   ALBUMIN 2.9* 2.2*  < > 1.9* 2.0* 1.9*  < > = values in this interval not displayed. No results for input(s): LIPASE, AMYLASE in the last 168 hours.  Recent Labs Lab 06/20/16 2054 06/22/16 0334 06/23/16 0332  AMMONIA 344* 72* 60*   CBC:  Recent Labs Lab 06/22/16 0333 06/23/16 0330 06/24/16 0435 06/25/16 0400 06/26/16 0413  WBC 15.0* 15.9* 19.5* 21.6* 26.2*  NEUTROABS 10.9* 12.4* 14.9* 18.5* 23.6*  HGB 8.9* 8.5* 8.2* 8.7* 8.7*  HCT 28.4* 26.1* 24.5* 26.1* 26.7*  MCV 87.9 85.0 82.5 81.3 82.4  PLT 256 224 200 197 178  Cardiac Enzymes:  Recent Labs Lab 06/20/16 2058 06/21/16 0135 06/21/16 0442 06/21/16 0900  CKTOTAL 34*  --   --   --   TROPONINI  --  0.04* 0.06* 0.06*   CBG:  Recent Labs Lab 06/25/16 1218 06/25/16 1611 06/25/16 1959 06/26/16 0002 06/26/16 0421  GLUCAP 97 111* 118* 112* 133*    Iron Studies: No results for input(s): IRON, TIBC, TRANSFERRIN, FERRITIN in the last 72 hours. Studies/Results: Dg Abd 1 View  Result Date: 06/24/2016 CLINICAL DATA:  Small bowel obstruction. EXAM: ABDOMEN - 1 VIEW COMPARISON:  June 22, 2016 FINDINGS: Small bowel remains dilated out of proportion to colonic gas measuring up to 4.3 cm, mildly improved in the interval. Minimal colonic gas in the colon. Large calcification in the right side of pelvis is likely a calcified uterine fibroid. NG tube terminates in the distal stomach. No other acute abnormalities. IMPRESSION: 1. Persistent but mildly improved small bowel obstruction. 2. Calcified uterine fibroid. Electronically Signed   By: Dorise Bullion III M.D   On: 06/24/2016 10:19   US Renal  Result Date: 06/24/2016 CLINICAL DATA:  65 year old female with history of acute kidney injury. Hypertension. EXAM: RENAL / URINARY TRACT  ULTRASOUND COMPLETE COMPARISON:  Abdominal ultrasound 08/03/2015. FINDINGS: Right Kidney: Length: 13.0 cm. Echogenicity within normal limits. No mass or hydronephrosis visualized. Left Kidney: Length: 12.7 cm. Echogenicity within normal limits. 1.9 x 1.5 x 1.6 cm anechoic lesion with increased through transmission in the lower pole of the left kidney, compatible with a small simple cyst. No hydronephrosis visualized. Bladder: Appears normal for degree of bladder distention. IMPRESSION: 1. Diffusely increased cortical echogenicity in the right kidney, concerning for medical renal disease. 2. No hydronephrosis. 3. Small simple cysts in the lower pole of the left kidney again noted. Electronically Signed   By: Vinnie Langton M.D.   On: 06/24/2016 16:26   Dg Chest Port 1 View  Result Date: 06/26/2016 CLINICAL DATA:  Hypoxia EXAM: PORTABLE CHEST 1 VIEW COMPARISON:  June 24, 2016 FINDINGS: The endotracheal tube tip is at the carina. Central catheter tip is at the cavoatrial junction. Nasogastric tube tip and side port are in the distal stomach. No pneumothorax. There is no edema or consolidation. Heart size and pulmonary vascularity are normal. No adenopathy. There is atherosclerotic calcification in the aorta. IMPRESSION: Endotracheal tube at carina. Advise withdrawing 2-3 cm. No edema or consolidation. Stable cardiac silhouette. There is aortic atherosclerosis. These results will be called to the ordering clinician or representative by the Radiologist Assistant, and communication documented in the PACS or zVision Dashboard. Electronically Signed   By: Lowella Grip III M.D.   On: 06/26/2016 07:13   Dg Chest Port 1 View  Result Date: 06/24/2016 CLINICAL DATA:  Evaluate central line placement. EXAM: PORTABLE CHEST 1 VIEW COMPARISON:  June 24, 2016 FINDINGS: The ETT, left central line, and NG tube are in good position. No pneumothorax. Elevation of the right hemidiaphragm remains. Cardiomediastinal  silhouette is stable. Scattered bilateral pulmonary opacities are stable. IMPRESSION: Mild bilateral pulmonary opacities are stable. No change in support apparatus. No pneumothorax. Electronically Signed   By: Dorise Bullion III M.D   On: 06/24/2016 13:52   Dg Chest Port 1 View  Result Date: 06/24/2016 CLINICAL DATA:  Respiratory failure EXAM: PORTABLE CHEST 1 VIEW COMPARISON:  06/20/2016 FINDINGS: Endotracheal tube tip 1.3 cm from the carina. NG tube placed. It is coiled in the stomach. Tip is beyond the lower limit of the image. Left jugular venous  catheter stable. The heart is mildly enlarged. Vascular congestion has increased. Hazy central basilar airspace disease left greater than right has developed. No pneumothorax. IMPRESSION: Endotracheal tube advanced. NG tube placed. Increasing bilateral central basilar airspace disease left greater than right worrisome for pulmonary edema. Electronically Signed   By: Marybelle Killings M.D.   On: 06/24/2016 10:16   Medications: Infusions: . heparin 10,000 units/ 20 mL infusion syringe    . dialysis replacement fluid (prismasate) 800 mL/hr at 06/26/16 0703  . dialysis replacement fluid (prismasate) 700 mL/hr at 06/26/16 0550  . dialysate (PRISMASATE) 2,000 mL/hr at 06/26/16 0500    Scheduled Medications: . aspirin  150 mg Rectal Daily  . chlorhexidine gluconate (MEDLINE KIT)  15 mL Mouth Rinse BID  . famotidine (PEPCID) IV  20 mg Intravenous Q24H  . feeding supplement (VITAL HIGH PROTEIN)  1,000 mL Per Tube Q24H  . heparin  5,000 Units Subcutaneous Q8H  . insulin aspart  0-9 Units Subcutaneous Q4H  . mouth rinse  15 mL Mouth Rinse QID  . meropenem (MERREM) IV  1 g Intravenous Q12H    have reviewed scheduled and prn medications.  Physical Exam: General: sedated . intubated Heart: RRR Lungs: CBS bilat Abdomen: obese, distended Extremities: pitting edema Dialysis Access: right IJ vascath- placed 9/30     06/26/2016,7:24 AM  LOS: 6 days

## 2016-06-26 NOTE — Progress Notes (Signed)
Nutrition Follow-up / Consult  DOCUMENTATION CODES:   Obesity unspecified  INTERVENTION:    Continue TF via OGT: Recommend increase Vital High Protein to goal rate of 60 ml/h (1440 ml per day) to provide 1440 kcals, 126 gm protein, 1204 ml free water daily.  NUTRITION DIAGNOSIS:   Inadequate oral intake related to inability to eat as evidenced by NPO status.  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 Patient brought to the ED on 9/26 after the patient's daughter found her to be unresponsive. She required intubation on admission.  Discussed patient in ICU rounds and with RN today. Patient remains on CRRT. Anuric. Labs reviewed: phosphorus low at 2.1, magnesium elevated at 2.5. Medications reviewed and include insulin Patient is currently receiving Vital High Protein via OGT at 20 ml/h (trickle rate) to provide 480 kcal, 42 gm protein, 401 ml free water daily.  Patient is currently intubated on ventilator support MV: 11.4 L/min Temp (24hrs), Avg:98.1 F (36.7 C), Min:97.5 F (36.4 C), Max:99.1 F (37.3 C)   Diet Order:  Diet NPO time specified  Skin:  Wound (see comment) (stage II pressure injury to lip)  Last BM:  9/28  Height:   Ht Readings from Last 1 Encounters:  06/20/16 '5\' 6"'$  (1.676 m)    Weight:   Wt Readings from Last 1 Encounters:  06/26/16 244 lb 14.9 oz (111.1 kg)    Ideal Body Weight:  59.1 kg  BMI:  Body mass index is 39.53 kg/m.  Estimated Nutritional Needs:   Kcal:  4239-5320  Protein:  >/= 118 gm  Fluid:  2 L  EDUCATION  NEEDS:   No education needs identified at this time  Molli Barrows, [place], [place], [place] Pager [phone] After Hours Pager [phone]

## 2016-06-26 NOTE — Progress Notes (Signed)
CRITICAL VALUE ALERT  Critical value received:  PTT = 175  Date of notification:  06/26/16  Time of notification:  0519  Critical value read back:Yes.    Nurse who received alert:  Delphia Grates   MD notified (1st page): DR Nelda Marseille   Time of first page:  0524  MD notified (2nd page):  Time of second page:  Responding MD:  Dr Nelda Marseille  Time MD responded:  [phone]

## 2016-06-26 NOTE — Care Management Important Message (Signed)
Important Message  Patient Details  Name: Angelica Beck MRN: [id number] Date of Birth: 1950-11-22   Medicare Important Message Given:  Yes    Nathen May 06/26/2016, 12:16 PM

## 2016-06-27 ENCOUNTER — Inpatient Hospital Stay (HOSPITAL_COMMUNITY): Payer: Medicare Other

## 2016-06-27 DIAGNOSIS — E1121 Type 2 diabetes mellitus with diabetic nephropathy: Secondary | ICD-10-CM

## 2016-06-27 DIAGNOSIS — R269 Unspecified abnormalities of gait and mobility: Secondary | ICD-10-CM

## 2016-06-27 LAB — CBC WITH DIFFERENTIAL/PLATELET
BASOS ABS: 0 10*3/uL (ref 0.0–0.1)
BASOS PCT: 0 %
EOS ABS: 0 10*3/uL (ref 0.0–0.7)
Eosinophils Relative: 0 %
HCT: 27.5 % — ABNORMAL LOW (ref 36.0–46.0)
Hemoglobin: 8.7 g/dL — ABNORMAL LOW (ref 12.0–15.0)
LYMPHS ABS: 1.2 10*3/uL (ref 0.7–4.0)
LYMPHS PCT: 4 %
MCH: 26.9 pg (ref 26.0–34.0)
MCHC: 31.6 g/dL (ref 30.0–36.0)
MCV: 85.1 fL (ref 78.0–100.0)
MONO ABS: 2.8 10*3/uL — AB (ref 0.1–1.0)
Monocytes Relative: 9 %
NEUTROS ABS: 26.6 10*3/uL — AB (ref 1.7–7.7)
Neutrophils Relative %: 87 %
PLATELETS: 155 10*3/uL (ref 150–400)
RBC: 3.23 MIL/uL — ABNORMAL LOW (ref 3.87–5.11)
RDW: 15.5 % (ref 11.5–15.5)
WBC: 30.6 10*3/uL — ABNORMAL HIGH (ref 4.0–10.5)

## 2016-06-27 LAB — RENAL FUNCTION PANEL
ANION GAP: 6 (ref 5–15)
Albumin: 1.9 g/dL — ABNORMAL LOW (ref 3.5–5.0)
BUN: 12 mg/dL (ref 6–20)
CHLORIDE: 102 mmol/L (ref 101–111)
CO2: 27 mmol/L (ref 22–32)
Calcium: 8 mg/dL — ABNORMAL LOW (ref 8.9–10.3)
Creatinine, Ser: 1.07 mg/dL — ABNORMAL HIGH (ref 0.44–1.00)
GFR calc Af Amer: >60 mL/min (ref >60)
GFR calc non Af Amer: 54 mL/min — ABNORMAL LOW (ref >60)
GLUCOSE: 140 mg/dL — AB (ref 65–99)
PHOSPHORUS: 1.4 mg/dL — AB (ref 2.5–4.6)
POTASSIUM: 4.1 mmol/L (ref 3.5–5.1)
Sodium: 135 mmol/L (ref 135–145)

## 2016-06-27 LAB — APTT: aPTT: 151 seconds — ABNORMAL HIGH (ref 24–36)

## 2016-06-27 LAB — GLUCOSE, CAPILLARY
GLUCOSE-CAPILLARY: 137 mg/dL — AB (ref 65–99)
GLUCOSE-CAPILLARY: 154 mg/dL — AB (ref 65–99)
GLUCOSE-CAPILLARY: 158 mg/dL — AB (ref 65–99)
Glucose-Capillary: 124 mg/dL — ABNORMAL HIGH (ref 65–99)
Glucose-Capillary: 172 mg/dL — ABNORMAL HIGH (ref 65–99)

## 2016-06-27 LAB — MAGNESIUM: MAGNESIUM: 2.7 mg/dL — AB (ref 1.7–2.4)

## 2016-06-27 LAB — TSH: TSH: 2.044 u[IU]/mL (ref 0.350–4.500)

## 2016-06-27 MED ORDER — SODIUM PHOSPHATES 45 MMOLE/15ML IV SOLN: 30.0000 mmol | Freq: Once | INTRAVENOUS | Status: DC

## 2016-06-27 MED ORDER — FUROSEMIDE 10 MG/ML IJ SOLN
160.0000 mg | Freq: Once | INTRAVENOUS | Status: AC
  Administered 2016-06-27: 160 mg via INTRAVENOUS
  Filled 2016-06-27 (×2): qty 16

## 2016-06-27 MED ORDER — SODIUM PHOSPHATES 45 MMOLE/15ML IV SOLN
20.0000 mmol | Freq: Once | INTRAVENOUS | Status: AC
  Administered 2016-06-27: 20 mmol via INTRAVENOUS
  Filled 2016-06-27: qty 6.67

## 2016-06-27 NOTE — Progress Notes (Signed)
PULMONARY / CRITICAL CARE MEDICINE   Name: Angelica Beck MRN: [id number] DOB: August 25, 1951    ADMISSION DATE:  06/20/2016 CONSULTATION DATE:  06/20/2016  REFERRING MD:  EDP Dr Cathleen Fears   CHIEF COMPLAINT:  AMS  HISTORY OF PRESENT ILLNESS:   65 year old female with past medical history as below, which is significant for stroke with subsequent debilitation, diabetes, chronic kidney disease, chronic urinary tract infections due to incontinence, for which she takes chronic antibiotics. She lives at home with her daughter where she requires full assistance she is unable to truly ambulate and spends most of her time in a hospital bed/recliner. She relies on family members almost entirely for activities of daily living. She was in her usual state of health until about 9/23 when she developed nausea and vomiting. This persisted for 2 days until symptoms subsided, however, she continued to complain of abdominal pain. 9/26 when the patient's daughter got home from work the patient was found to be unresponsive. EMS was called and she was found hypoglycemic which was corrected, however, altered mental status did not improve. Upon arrival to the emergency department she continued to be lethargic and was found to be hypotensive with hypoxemia. Laboratory evaluation significant for potassium 7.4, bicarbonate less than 7, creatinine 6.51, BUN 95, WBC 12.3, lactic acid 13.88, pH 7.07.  SUBJECTIVE:  Remains intubated and on CRRT.  Not awake enough to assess for extubation (note, has not required any sedation).   VITAL SIGNS: BP (!) 147/92   Pulse 99   Temp 98.4 F (36.9 C) (Axillary)   Resp (!) 28   Ht '5\' 6"'$  (1.676 m)   Wt 108.1 kg (238 lb 5.1 oz)   SpO2 100%   BMI 38.47 kg/m   HEMODYNAMICS: CVP:  [12 mmHg] 12 mmHg  VENTILATOR SETTINGS: Vent Mode: PSV;CPAP FiO2 (%):  [40 %] 40 % Set Rate:  [20 bmp] 20 bmp Vt Set:  [470 mL] 470 mL PEEP:  [5 cmH20] 5 cmH20 Pressure Support:  [5 cmH20] 5  cmH20 Plateau Pressure:  [16 cmH20-18 cmH20] 17 cmH20  INTAKE / OUTPUT: I/O last 3 completed shifts: In: 2490 [I.V.:400; NG/GT:840; IV Piggyback:1250] Out: 5056 [Other:5056]  PHYSICAL EXAMINATION: General:  Obese female.  Chronically ill appearing. Neuro: Opens eyes to noxious stimuli, does not follow commands. HEENT:  No scleral icterus or injection. ETT in place. Moist mucus membranes though dried blood noted on lips and in oral cavity. Cardiovascular:  Regular rhythm and rate. No edema. Unable to appreciate JVD. Lungs:  Coarse bilaterally.  Symmetric chest rise on ventilator.  Abdomen:  Soft. Protuberant. Hypoactive BS. Musculoskeletal:  No joint deformity or effusion. No crepitus. Skin:  Warm & dry. No rash on exposed skin. No erythema in left arm.   LABS:  BMET  Recent Labs Lab 06/26/16 0413 06/26/16 1600 06/27/16 0420  NA 138 138 135  K 4.4 4.3 4.1  CL 102 103 102  CO2 '24 25 27  '$ BUN 21* 14 12  CREATININE 1.63* 1.37* 1.07*  GLUCOSE 140* 145* 140*    Electrolytes  Recent Labs Lab 06/25/16 0400  06/26/16 0413 06/26/16 1600 06/27/16 0420  CALCIUM 6.9*  < > 7.8* 7.8* 8.0*  MG 2.3  --  2.5*  --  2.7*  PHOS 4.0  < > 2.1* 1.5* 1.4*  < > = values in this interval not displayed.  CBC  Recent Labs Lab 06/25/16 0400 06/26/16 0413 06/27/16 0420  WBC 21.6* 26.2* 30.6*  HGB 8.7* 8.7* 8.7*  HCT 26.1*  26.7* 27.5*  PLT 197 178 155    Coag's  Recent Labs Lab 06/21/16 0135 06/25/16 0400 06/25/16 0650 06/26/16 0413 06/27/16 0420  APTT  --  147*  --  175* 151*  INR 1.85  --  1.76  --   --     Sepsis Markers  Recent Labs Lab 06/21/16 0135  06/25/16 0415 06/25/16 1302 06/26/16 0413  LATICACIDVEN  --   < > 9.2* 3.9* 1.8  PROCALCITON 5.09  --   --   --   --   < > = values in this interval not displayed.  ABG  Recent Labs Lab 06/24/16 0400 06/24/16 1605 06/25/16 0355  PHART 7.492* 7.486* 7.420  PCO2ART 22.8* 23.2* 31.4*  PO2ART 154* 170*  71.5*    Liver Enzymes  Recent Labs Lab 06/20/16 2058 06/22/16 0333  06/25/16 0650  06/26/16 0413 06/26/16 1600 06/27/16 0420  AST 30 40  --  <5*  --   --   --   --   ALT 12* 15  --  91*  --   --   --   --   ALKPHOS 59 61  --  300*  --   --   --   --   BILITOT 0.9 0.7  --  1.3*  --   --   --   --   ALBUMIN 2.9* 2.2*  < > 1.9*  < > 1.9* 1.8* 1.9*  < > = values in this interval not displayed.  Cardiac Enzymes  Recent Labs Lab 06/21/16 0135 06/21/16 0442 06/21/16 0900  TROPONINI 0.04* 0.06* 0.06*    Glucose  Recent Labs Lab 06/26/16 1554 06/26/16 2008 06/26/16 2346 06/27/16 0342 06/27/16 0808 06/27/16 1135  GLUCAP 141* 151* 132* 124* 137* 154*    Imaging Dg Chest Port 1 View  Result Date: 06/27/2016 CLINICAL DATA:  Respiratory failure. EXAM: PORTABLE CHEST 1 VIEW COMPARISON:  06/26/2016. FINDINGS: Endotracheal tube, left IJ line, NG tube in stable position. Heart size stable. Low lung volumes with mild bibasilar atelectasis. No pleural effusion or pneumothorax. IMPRESSION: 1. Lines and tubes in stable position. 2. Low lung volumes with mild bibasilar atelectasis and/or infiltrates. Electronically Signed   By: Marcello Moores  Register   On: 06/27/2016 07:13    STUDIES:  CT Head 9/27:  No acute abnormality. Chronic small vessel ischemic changes. Chronic lacunar infarct left basal ganglia & chronic ischemia right side of pons.  CT Abd/Pelvis w/o 9/27:  Early partial SBO w/ transition point LLQ with adhesions. Left humerus soft tissue swelling & intramuscular gas.  Left Arm X-ray 9/27:  Diffuse soft tissue edema.  TTE 9/27:  LV w/ EF 60-65%. Grade 1 Diastolic dysfunction. No regional wall motion abnormalities. LA & RA normal in size. RV normal in size & function. Mild AS w/o AR. Mild MR w/o MS.   MICROBIOLOGY: MRSA PCR 9/27:  Negative Blood Ctx x 2 9/26 >> Coag Neg Staph 1/2 Urine Ctx 9/26:  5000 CFU E coli & 1000 CFU Klebsiella   ANTIBIOTICS: Levaquin 9/26 -  9/28 Aztreonam 9/26 - 9/27 Vancomycin 9/26 >> Merrem 9/27 >>  SIGNIFICANT EVENTS: 9/26 - admit for presumed septic shock  LINES/TUBES: OETT 7.5 9/26 >> L IJ CVL 9/26 >> Foley 9/26 >> RIJ HD 9/30 >>  ASSESSMENT / PLAN:  PULMONARY A: Acute Hypoxic Respiratory Failure - Unable to protect airway with acute encephalopathy.  OHS P:   SBT as tolerates, not awake enough this AM to consider extubation however. CXR  intermittently.  CARDIOVASCULAR A:  Shock - Likely sepsis. Cortisol 45.1. Resolved. H/O HTN & HLD P:  Monitor hemodynamics.  RENAL A:   Acute On Chronic Renal Failure Stage IV   Profound Metabolic Acidosis  Hyperkalemia - Resolved.  Lactic Acidosis, suspect due to hypoperfusion and also to metformin, persisted Likely due to poor renal clearance.  Resolved 10/2 after CRRT. Hyperphosphatemia - Secondary to acute renal failure. Improving. Pseudohypocalcemia -  P:   Monitoring UOP with Foley, minimal. Lasix given by Renal today CRRT ending thia am, following renal fxn BMP in AM.  GASTROINTESTINAL A:   Partial SBO - resolving on XR abd 9/30. H/O Ventral Hernia Repair. Possible Liver Cirrhosis. Transaminitis. P:   Start trickle feeds. Pepcid IV daily.   HEMATOLOGIC A:   Anemia - Chronic. No signs of active bleeding. Hgb stable. Leukocytosis - Likely due to sepsis. Worsening slowly. P:  SCD's / Heparin. CBC in AM.  INFECTIOUS A:   Severe Sepsis - due to E.coli and Klebsiella UTI. Unlikely Coag Neg Staph Bacteremia, suspect contaminant P:   Continue empiric meropenem for 8 days total than d/c Following cultures to completion. Holding home Trimethoprim.  ENDOCRINE A:   Hypoglycemia - Resolved with Dextrose IVF. H/O DM Type 2 - A1c 5.8. H/O Hyperthyroidism - On PTU at home. TSH 0.331 & F T4 1.33.  P:   SSI per Sensitive Algorithm. Holding PTU & Metformin.  NEUROLOGIC A:   Acute Encephalopathy - Question hepatic vs toxic metabolic vs hypoxia. No  improvement in Lactulose Enema despite improved Ammonia.  Favor toxic metabolic as is gradually improving with CRRT.  H/O CVA - Minimal mobility. P:   RASS goal: 0 to -1 Avoid sedating meds. In absence of significant neurological improvement I believe we need to repeat her CT head, check TSH (off PTU), check folate  MUSCULOSKELETAL A: Edema Left Arm - Improved. Secondary to IV infiltration. No gas on X-ray 9/27. P: Monitor closely - no crepitus   FAMILY  - Updates: None available 10/2.  - Inter-disciplinary family meet or Palliative Care meeting due by:  10/2   CC time: 40 minutes.   Baltazar Apo, MD, PhD 06/27/2016, 2:30 PM [facility] [phone] or if no answer [phone]

## 2016-06-27 NOTE — Progress Notes (Signed)
Subjective:  BP better, no pressors, no UOP- CRRT running well  Objective Vital signs in last 24 hours: Vitals:   06/27/16 0400 06/27/16 0500 06/27/16 0600 06/27/16 0700  BP: 125/69 (!) 141/76 (!) 156/81 (!) 152/79  Pulse: 88 89 87 86  Resp: (!) 23 (!) 21 (!) 21 (!) 22  Temp:      TempSrc:      SpO2: 100% 100% 100% 100%  Weight: 108.1 kg (238 lb 5.1 oz)     Height:       Weight change: -3 kg (-6 lb 9.8 oz)  Intake/Output Summary (Last 24 hours) at 06/27/16 0757 Last data filed at 06/27/16 0700  Gross per 24 hour  Intake             1925 ml  Output             3261 ml  Net            -1336 ml    Assessment/ Plan: Pt is a 65 y.o. yo female with CVA, DM, indwelling foley and severely debilitated at baseline- creatinine 1.55 ? Although AKI in past - crt in the high 2's in November 2016- who was admitted on 06/20/2016 with  Shock/sepsis, lactic acidosis with A on CRF Assessment/Plan: 1. Renal- A on CRF- on CRRT since 9/30- machine running well - good clearance- will stop CRRT today- then will continue to monitor labs and UOP.  In looking at history not sure if patient is a great candidate for chronic HD.  Will try to challenge with some lasix 2. Shock- per CCM- thought to be sepsis- now on meropenam - previous vanc- seems to be improving   3. Anemia- hgb 8's but stable- added ESA  4. HTN/volume- overloaded/third spacing but also CVP in low teens- tolerated 100 per hour volume off previously- is 9 liters positive overall but may have needed it.  Will challenge with some lasix   Dasha Kawabata A    Labs: Basic Metabolic Panel:  Recent Labs Lab 06/26/16 0413 06/26/16 1600 06/27/16 0420  NA 138 138 135  K 4.4 4.3 4.1  CL 102 103 102  CO2 _0 GLUCOSE 140* 145* 140*  BUN 21* 14 12  CREATININE 1.63* 1.37* 1.07*  CALCIUM 7.8* 7.8* 8.0*  PHOS 2.1* 1.5* 1.4*   Liver Function Tests:  Recent Labs Lab 06/20/16 2058 06/22/16 0333  06/25/16 0650  06/26/16 0413  06/26/16 1600 06/27/16 0420  AST 30 40  --  <5*  --   --   --   --   ALT 12* 15  --  91*  --   --   --   --   ALKPHOS 59 61  --  300*  --   --   --   --   BILITOT 0.9 0.7  --  1.3*  --   --   --   --   PROT 6.7 5.2*  --  5.2*  --   --   --   --   ALBUMIN 2.9* 2.2*  < > 1.9*  < > 1.9* 1.8* 1.9*  < > = values in this interval not displayed. No results for input(s): LIPASE, AMYLASE in the last 168 hours.  Recent Labs Lab 06/20/16 2054 06/22/16 0334 06/23/16 0332  AMMONIA 344* 72* 60*   CBC:  Recent Labs Lab 06/23/16 0330 06/24/16 0435 06/25/16 0400 06/26/16 0413 06/27/16 0420  WBC 15.9* 19.5* 21.6* 26.2* 30.6*  NEUTROABS 12.4*  14.9* 18.5* 23.6* 26.6*  HGB 8.5* 8.2* 8.7* 8.7* 8.7*  HCT 26.1* 24.5* 26.1* 26.7* 27.5*  MCV 85.0 82.5 81.3 82.4 85.1  PLT 224 200 197 178 155   Cardiac Enzymes:  Recent Labs Lab 06/20/16 2058 06/21/16 0135 06/21/16 0442 06/21/16 0900  CKTOTAL 34*  --   --   --   TROPONINI  --  0.04* 0.06* 0.06*   CBG:  Recent Labs Lab 06/26/16 1147 06/26/16 1554 06/26/16 2008 06/26/16 2346 06/27/16 0342  GLUCAP 145* 141* 151* 132* 124*    Iron Studies: No results for input(s): IRON, TIBC, TRANSFERRIN, FERRITIN in the last 72 hours. Studies/Results: Dg Chest Port 1 View  Result Date: 06/27/2016 CLINICAL DATA:  Respiratory failure. EXAM: PORTABLE CHEST 1 VIEW COMPARISON:  06/26/2016. FINDINGS: Endotracheal tube, left IJ line, NG tube in stable position. Heart size stable. Low lung volumes with mild bibasilar atelectasis. No pleural effusion or pneumothorax. IMPRESSION: 1. Lines and tubes in stable position. 2. Low lung volumes with mild bibasilar atelectasis and/or infiltrates. Electronically Signed   By: Marcello Moores  Register   On: 06/27/2016 07:13   Dg Chest Port 1 View  Result Date: 06/26/2016 CLINICAL DATA:  Hypoxia EXAM: PORTABLE CHEST 1 VIEW COMPARISON:  June 24, 2016 FINDINGS: The endotracheal tube tip is at the carina. Central catheter tip  is at the cavoatrial junction. Nasogastric tube tip and side port are in the distal stomach. No pneumothorax. There is no edema or consolidation. Heart size and pulmonary vascularity are normal. No adenopathy. There is atherosclerotic calcification in the aorta. IMPRESSION: Endotracheal tube at carina. Advise withdrawing 2-3 cm. No edema or consolidation. Stable cardiac silhouette. There is aortic atherosclerosis. These results will be called to the ordering clinician or representative by the Radiologist Assistant, and communication documented in the PACS or zVision Dashboard. Electronically Signed   By: Lowella Grip III M.D.   On: 06/26/2016 07:13   Medications: Infusions: . heparin 10,000 units/ 20 mL infusion syringe    . dialysis replacement fluid (prismasate) 800 mL/hr at 06/27/16 0232  . dialysis replacement fluid (prismasate) 700 mL/hr at 06/27/16 0406  . dialysate (PRISMASATE) 2,000 mL/hr at 06/27/16 0700    Scheduled Medications: . aspirin  150 mg Rectal Daily  . chlorhexidine gluconate (MEDLINE KIT)  15 mL Mouth Rinse BID  . darbepoetin (ARANESP) injection - DIALYSIS  100 mcg Intravenous Q Mon-HD  . famotidine (PEPCID) IV  20 mg Intravenous Q24H  . feeding supplement (VITAL HIGH PROTEIN)  1,000 mL Per Tube Q24H  . heparin  5,000 Units Subcutaneous Q8H  . insulin aspart  0-9 Units Subcutaneous Q4H  . mouth rinse  15 mL Mouth Rinse QID  . meropenem (MERREM) IV  1 g Intravenous Q12H    have reviewed scheduled and prn medications.  Physical Exam: General: sedated . intubated Heart: RRR Lungs: CBS bilat Abdomen: obese, distended Extremities: pitting edema Dialysis Access: right IJ vascath- placed 9/30     06/27/2016,7:57 AM  LOS: 7 days

## 2016-06-27 NOTE — Progress Notes (Signed)
Unable to palpate or doppler dorsalis pedis, posterior tibialis, and popliteal pulses on Right side. Doppler obtained at Right femoral site. MD made aware.

## 2016-06-27 NOTE — Progress Notes (Signed)
Patient transported to CT and back to room 3I95 without complications.

## 2016-06-27 NOTE — Progress Notes (Signed)
VASCULAR LAB PRELIMINARY  ARTERIAL  ABI completed:    RIGHT    LEFT    PRESSURE WAVEFORM  PRESSURE WAVEFORM  BRACHIAL 120 Triphasic BRACHIAL 117 Triphasic  DP 121 Triphasic DP 164 Triphasic  AT   AT    PT 148 Triphasic PT Noncompressible Triphasic  PER   PER    GREAT TOE  NA GREAT TOE  NA    RIGHT LEFT  ABI 1.23 1.37    The right ABI is within normal limits at rest. The left ABI is elevated at rest, suggestive of possible medial calcification.  06/27/2016 3:54 PM Maudry Mayhew, BS, RVT, RDCS, RDMS

## 2016-06-27 NOTE — Progress Notes (Signed)
Pharmacy Antibiotic Note  Angelica Beck is a 65 y.o. female admitted on 06/20/2016 with sepsis.  Pharmacy has been consulted for Merrem dosing. Today is day #7 of therapy - last doses today. CRRT has been discontinued but patient remains without urine output.   Plan: Complete 1 dose of Merrem today due to lack of UOP and not on CRRT.  This will complete therapy for 7 day course - discussed with Dr Ola Spurr of CCM team.  Height: '5\' 6"'$  (167.6 cm) Weight: 238 lb 5.1 oz (108.1 kg) IBW/kg (Calculated) : 59.3  Temp (24hrs), Avg:98.4 F (36.9 C), Min:97.4 F (36.3 C), Max:99.4 F (37.4 C)   Recent Labs Lab 06/23/16 0330  06/24/16 0435 06/24/16 0437  06/24/16 1618 06/25/16 0400 06/25/16 0415 06/25/16 1302 06/25/16 1704 06/26/16 0413 06/26/16 1600 06/27/16 0420  WBC 15.9*  --  19.5*  --   --   --  21.6*  --   --   --  26.2*  --  30.6*  CREATININE  --   < > 6.88*  --   < >  --  3.84*  --   --  2.18* 1.63* 1.37* 1.07*  LATICACIDVEN  --   < >  --  20.9*  --  20.1*  --  9.2* 3.9*  --  1.8  --   --   < > = values in this interval not displayed.  Estimated Creatinine Clearance: 66.9 mL/min (by C-G formula based on SCr of 1.07 mg/dL (H)).    Allergies  Allergen Reactions  . Lactose Intolerance (Gi) Diarrhea and Nausea And Vomiting  . Penicillins Swelling    Facial swelling    Antimicrobials this admission: Vanc 9/26 >> 9/30 Azactam 9/26 >>9/27 LVQ 9/26 >>9/27 Merrem 9/27>>10/3  Dose adjustments this admission:  N/A  Microbiology results:  9/27 MRSA PCR: neg 9/26 BCx: CONS (BCID: staph species) 1/2 9/26 UCx: 5k col Ecoli, 1k col Kleb pneumo - likely colonized  Thank you for allowing pharmacy to be a part of this patient's care.  Sloan Leiter, PharmD, BCPS Clinical Pharmacist [phone] 06/27/2016 2:16 PM

## 2016-06-28 LAB — CBC WITH DIFFERENTIAL/PLATELET
Basophils Absolute: 0 10*3/uL (ref 0.0–0.1)
Basophils Relative: 0 %
EOS ABS: 0 10*3/uL (ref 0.0–0.7)
EOS PCT: 0 %
HEMATOCRIT: 24.6 % — AB (ref 36.0–46.0)
Hemoglobin: 7.9 g/dL — ABNORMAL LOW (ref 12.0–15.0)
LYMPHS PCT: 4 %
Lymphs Abs: 1.2 10*3/uL (ref 0.7–4.0)
MCH: 26.9 pg (ref 26.0–34.0)
MCHC: 32.1 g/dL (ref 30.0–36.0)
MCV: 83.7 fL (ref 78.0–100.0)
MONO ABS: 3.6 10*3/uL — AB (ref 0.1–1.0)
Monocytes Relative: 12 %
NEUTROS PCT: 84 %
Neutro Abs: 24.8 10*3/uL — ABNORMAL HIGH (ref 1.7–7.7)
PLATELETS: 121 10*3/uL — AB (ref 150–400)
RBC: 2.94 MIL/uL — AB (ref 3.87–5.11)
RDW: 15.3 % (ref 11.5–15.5)
WBC: 29.6 10*3/uL — AB (ref 4.0–10.5)

## 2016-06-28 LAB — GLUCOSE, CAPILLARY
GLUCOSE-CAPILLARY: 143 mg/dL — AB (ref 65–99)
GLUCOSE-CAPILLARY: 122 mg/dL — AB (ref 65–99)
GLUCOSE-CAPILLARY: 150 mg/dL — AB (ref 65–99)
Glucose-Capillary: 107 mg/dL — ABNORMAL HIGH (ref 65–99)
Glucose-Capillary: 113 mg/dL — ABNORMAL HIGH (ref 65–99)
Glucose-Capillary: 127 mg/dL — ABNORMAL HIGH (ref 65–99)
Glucose-Capillary: 153 mg/dL — ABNORMAL HIGH (ref 65–99)

## 2016-06-28 LAB — RENAL FUNCTION PANEL
ALBUMIN: 1.6 g/dL — AB (ref 3.5–5.0)
Anion gap: 10 (ref 5–15)
BUN: 27 mg/dL — AB (ref 6–20)
CALCIUM: 8.3 mg/dL — AB (ref 8.9–10.3)
CO2: 27 mmol/L (ref 22–32)
CREATININE: 1.97 mg/dL — AB (ref 0.44–1.00)
Chloride: 101 mmol/L (ref 101–111)
GFR calc Af Amer: 30 mL/min — ABNORMAL LOW (ref >60)
GFR calc non Af Amer: 26 mL/min — ABNORMAL LOW (ref >60)
GLUCOSE: 152 mg/dL — AB (ref 65–99)
PHOSPHORUS: 3.1 mg/dL (ref 2.5–4.6)
Potassium: 4.5 mmol/L (ref 3.5–5.1)
SODIUM: 138 mmol/L (ref 135–145)

## 2016-06-28 LAB — FOLATE RBC
FOLATE, RBC: 1371 ng/mL (ref >498)
Folate, Hemolysate: 367.5 ng/mL
HEMATOCRIT: 26.8 % — AB (ref 34.0–46.6)

## 2016-06-28 LAB — HEPATIC FUNCTION PANEL
ALK PHOS: 639 U/L — AB (ref 38–126)
ALT: 591 U/L — AB (ref 14–54)
AST: 2157 U/L — AB (ref 15–41)
Albumin: 1.5 g/dL — ABNORMAL LOW (ref 3.5–5.0)
BILIRUBIN DIRECT: 0.4 mg/dL (ref 0.1–0.5)
BILIRUBIN INDIRECT: 0.5 mg/dL (ref 0.3–0.9)
TOTAL PROTEIN: 5.3 g/dL — AB (ref 6.5–8.1)
Total Bilirubin: 0.9 mg/dL (ref 0.3–1.2)

## 2016-06-28 LAB — MAGNESIUM: Magnesium: 2.5 mg/dL — ABNORMAL HIGH (ref 1.7–2.4)

## 2016-06-28 MED ORDER — VITAL HIGH PROTEIN PO LIQD
1000.0000 mL | ORAL | Status: DC
  Administered 2016-06-29 – 2016-06-30 (×2): 1000 mL

## 2016-06-28 NOTE — Care Management Important Message (Signed)
Important Message  Patient Details  Name: Angelica Beck MRN: [id number] Date of Birth: [birth year]   Medicare Important Message Given:  Yes    Sola Margolis Abena 06/28/2016, 10:28 AM

## 2016-06-28 NOTE — Progress Notes (Signed)
PULMONARY / CRITICAL CARE MEDICINE   Name: Angelica Beck MRN: [id number] DOB: November 03, 1950    ADMISSION DATE:  06/20/2016 CONSULTATION DATE:  06/20/2016  REFERRING MD:  EDP Dr Cathleen Fears   CHIEF COMPLAINT:  AMS  HISTORY OF PRESENT ILLNESS:   65 year old female with past medical history as below, which is significant for stroke with subsequent debilitation, diabetes, chronic kidney disease, chronic urinary tract infections due to incontinence, for which she takes chronic antibiotics. She lives at home with her daughter where she requires full assistance she is unable to truly ambulate and spends most of her time in a hospital bed/recliner. She relies on family members almost entirely for activities of daily living. She was in her usual state of health until about 9/23 when she developed nausea and vomiting. This persisted for 2 days until symptoms subsided, however, she continued to complain of abdominal pain. 9/26 when the patient's daughter got home from work the patient was found to be unresponsive. EMS was called and she was found hypoglycemic which was corrected, however, altered mental status did not improve. Upon arrival to the emergency department she continued to be lethargic and was found to be hypotensive with hypoxemia. Laboratory evaluation significant for potassium 7.4, bicarbonate less than 7, creatinine 6.51, BUN 95, WBC 12.3, lactic acid 13.88, pH 7.07.  SUBJECTIVE:  Remains intubated and on CRRT. Opens eyes when addressed but quickly closes eyes again.   VITAL SIGNS: BP (!) 105/55   Pulse 90   Temp 99.5 F (37.5 C) (Oral)   Resp (!) 25   Ht '5\' 6"'$  (1.676 m)   Wt 241 lb 10 oz (109.6 kg)   SpO2 99%   BMI 39.00 kg/m   HEMODYNAMICS: CVP:  [12 mmHg] 12 mmHg  VENTILATOR SETTINGS: Vent Mode: PRVC FiO2 (%):  [40 %] 40 % Set Rate:  [20 bmp] 20 bmp Vt Set:  [470 mL] 470 mL PEEP:  [5 cmH20] 5 cmH20 Pressure Support:  [5 cmH20] 5 cmH20 Plateau Pressure:  [18 cmH20-22 cmH20]  19 cmH20  INTAKE / OUTPUT: I/O last 3 completed shifts: In: 2787.7 [I.V.:385; NG/GT:780; IV Piggyback:1622.7] Out: 3399 [Other:3399]  PHYSICAL EXAMINATION: General:  Obese female. Chronically ill appearing. Neuro: Opens eyes to voice today, does not follow commands. HEENT:  No scleral icterus or injection. ETT in place. Moist mucus membranes though dried blood noted on lips and in oral cavity. Cardiovascular:  Regular rhythm and rate. 1+ LE bilaterally to knee. Unable to appreciate JVD. Lungs:  Coarse bilaterally.  Symmetric chest rise on ventilator.  Abdomen:  Soft. Protuberant. Hypoactive BS. Patient grimaces with deep palpation.  Musculoskeletal:  No joint deformity or effusion. No crepitus. Skin:  Warm & dry. No rash on exposed skin. No erythema in left arm.   LABS:  BMET  Recent Labs Lab 06/26/16 1600 06/27/16 0420 06/28/16 0355  NA 138 135 138  K 4.3 4.1 4.5  CL 103 102 101  CO2 '25 27 27  '$ BUN 14 12 27*  CREATININE 1.37* 1.07* 1.97*  GLUCOSE 145* 140* 152*    Electrolytes  Recent Labs Lab 06/26/16 0413 06/26/16 1600 06/27/16 0420 06/28/16 0355  CALCIUM 7.8* 7.8* 8.0* 8.3*  MG 2.5*  --  2.7* 2.5*  PHOS 2.1* 1.5* 1.4* 3.1    CBC  Recent Labs Lab 06/26/16 0413 06/27/16 0420 06/28/16 0355  WBC 26.2* 30.6* 29.6*  HGB 8.7* 8.7* 7.9*  HCT 26.7* 27.5* 24.6*  PLT 178 155 121*    Coag's  Recent Labs Lab  06/25/16 0400 06/25/16 0650 06/26/16 0413 06/27/16 0420  APTT 147*  --  175* 151*  INR  --  1.76  --   --     Sepsis Markers  Recent Labs Lab 06/25/16 0415 06/25/16 1302 06/26/16 0413  LATICACIDVEN 9.2* 3.9* 1.8    ABG  Recent Labs Lab 06/24/16 0400 06/24/16 1605 06/25/16 0355  PHART 7.492* 7.486* 7.420  PCO2ART 22.8* 23.2* 31.4*  PO2ART 154* 170* 71.5*    Liver Enzymes  Recent Labs Lab 06/22/16 0333  06/25/16 0650  06/26/16 1600 06/27/16 0420 06/28/16 0355  AST 40  --  <5*  --   --   --   --   ALT 15  --  91*  --   --    --   --   ALKPHOS 61  --  300*  --   --   --   --   BILITOT 0.7  --  1.3*  --   --   --   --   ALBUMIN 2.2*  < > 1.9*  < > 1.8* 1.9* 1.6*  < > = values in this interval not displayed.  Cardiac Enzymes  Recent Labs Lab 06/21/16 0900  TROPONINI 0.06*    Glucose  Recent Labs Lab 06/27/16 0808 06/27/16 1135 06/27/16 1535 06/27/16 1958 06/28/16 0010 06/28/16 0409  GLUCAP 137* 154* 172* 158* 153* 143*    Imaging Ct Head Wo Contrast  Result Date: 06/27/2016 CLINICAL DATA:  Encephalopathy EXAM: CT HEAD WITHOUT CONTRAST TECHNIQUE: Contiguous axial images were obtained from the base of the skull through the vertex without intravenous contrast. COMPARISON:  June 21, 2016 FINDINGS: Brain: Moderate diffuse atrophy is stable. There is no intracranial mass, hemorrhage, extra-axial fluid collection, or midline shift. There is extensive small vessel disease. And the centra semiovale bilaterally, stable. There is a prior lacunar infarct in the anterior limb of the internal capsule, stable. There is evidence of a prior small lacunar infarct in the lateral left cerebellum. There is a prior lacunar infarct in the genu of the right internal capsule. Small vessel disease is noted in each thalamus region. No acute appearing infarct is evident. Vascular: No hyperdense vessels are evident. There is calcification in each carotid siphon region as well as in each distal vertebral artery. Skull: The bony calvarium appears intact. Sinuses/Orbits: There is mild mucosal thickening in a left ethmoid air cell. Visualized paranasal sinuses elsewhere clear. Visualized orbits appear symmetric bilaterally. Other: Mastoid air cells are clear. There is debris in each external auditory canal. IMPRESSION: Atrophy with extensive supratentorial small vessel disease. Prior lacunar type infarcts evident. No acute infarct evident. No hemorrhage or mass. There are foci of arterial vascular calcification. There is mild left  ethmoid sinus disease. There is probable cerumen in each external auditory canal. Electronically Signed   By: Lowella Grip III M.D.   On: 06/27/2016 17:42    STUDIES:  CT Head 9/27:  No acute abnormality. Chronic small vessel ischemic changes. Chronic lacunar infarct left basal ganglia & chronic ischemia right side of pons.  CT Abd/Pelvis w/o 9/27:  Early partial SBO w/ transition point LLQ with adhesions. Left humerus soft tissue swelling & intramuscular gas.  Left Arm X-ray 9/27:  Diffuse soft tissue edema.  TTE 9/27:  LV w/ EF 60-65%. Grade 1 Diastolic dysfunction. No regional wall motion abnormalities. LA & RA normal in size. RV normal in size & function. Mild AS w/o AR. Mild MR w/o MS.  ABIs 10/3: R normal.  L posterior artery noncompressible; suspect calcification.  CT head 10/3: No acute infarct, no hemorrhage or mass. Atrophy evident. Similar to prior study.   MICROBIOLOGY: MRSA PCR 9/27:  Negative Blood Ctx x 2 9/26 >> Coag Neg Staph 1/2 Urine Ctx 9/26:  5000 CFU E coli & 1000 CFU Klebsiella   ANTIBIOTICS: Levaquin 9/26 - 9/28 Aztreonam 9/26 - 9/27 Vancomycin 9/26 >> Merrem 9/27 >> 10/3  SIGNIFICANT EVENTS: 9/26 - admit for presumed septic shock  LINES/TUBES: OETT 7.5 9/26 >> L IJ CVL 9/26 >> Foley 9/26 >> RIJ HD 9/30 >>  DISCUSSION: Angelica Beck is a 65-y/o female with T2DM, CKD, recurrent UTIs 2/2 incontinence, and stroke with subsequent severe debilitation (ECOG 4), who presented with AMS and found to be in shock, likely 2/2 UTI sepsis. Some improvement in level of alertness with CRRT. Completed 7-day course of meropenem 10/3.   ASSESSMENT / PLAN:  PULMONARY A: Acute Hypoxic Respiratory Failure - Unable to protect airway with acute encephalopathy.  OHS P:   SBT as tolerates, not awake enough this AM to consider extubation however. CXR intermittently.  CARDIOVASCULAR A:  Shock - Likely sepsis. Cortisol 45.1. Resolved. H/O HTN & HLD P:  Monitor  hemodynamics.  RENAL A:   Acute On Chronic Renal Failure Stage IV   Profound Metabolic Acidosis  Hyperkalemia - Resolved.  Lactic Acidosis, suspect due to hypoperfusion and also to metformin, persisted likely due to poor renal clearance.  Resolved 10/2 after CRRT. Hyperphosphatemia - Secondary to acute renal failure. Resolved. Pseudohypocalcemia - Improving.  P:   Monitoring UOP with Foley, minimal. Lasix given by Renal 10/3 without improved UOP.  CRRT ended 10/3, following renal fxn  GASTROINTESTINAL A:   Partial SBO - resolving on XR abd 9/30. H/O Ventral Hernia Repair. Possible Liver Cirrhosis. Transaminitis. P:   Start trickle feeds. Pepcid IV daily.  CMP in AM  HEMATOLOGIC A:   Anemia - Chronic. No signs of active bleeding. Hgb down today to 7.9 < 8.7. Leukocytosis - Likely due to sepsis. Worsening slowly. P:  SCD's / Heparin. CBC in AM. Transfusion goal of 7.0.   INFECTIOUS A:   Severe Sepsis - due to E.coli and Klebsiella UTI. Unlikely Coag Neg Staph Bacteremia, suspect contaminant P:   Completed course of meropenem Following cultures to completion. Holding home Trimethoprim.  ENDOCRINE A:   Hypoglycemia - Resolved with Dextrose IVF. H/O DM Type 2 - A1c 5.8. H/O Hyperthyroidism - On PTU at home. TSH 0.331 & F T4 1.33 9/26. Repeat TSH 2.044 10/3.  P:   SSI per Sensitive Algorithm. Holding PTU & Metformin.  NEUROLOGIC A:   Acute Encephalopathy - Question hepatic vs toxic metabolic vs hypoxia. No improvement in Lactulose Enema despite improved Ammonia.  Favor toxic metabolic as is gradually improving with CRRT. Repeat CT head unchanged. TSH WNL.  H/O CVA - Minimal mobility. P:   RASS goal: 0 to -1 Avoid sedating meds. Folate in process.   MUSCULOSKELETAL A: Edema Left Arm - Improved. Secondary to IV infiltration. No gas on X-ray 9/27. P: Monitor closely - no crepitus   FAMILY  - Updates: None available 10/4.  - Inter-disciplinary family meet  or Palliative Care meeting due by:  10/2  Olene Floss, MD [place], PGY-2   Attending Note:  I have examined patient, reviewed labs, studies and notes. I have discussed the case with Dr Ola Spurr, and I agree with the data and plans as amended above. 65 yo woman, hx CVA, dependent  for most of her care, admitted with severe lactic acidosis possibly due to metformin. Course c/b severe encephalopathy and acute respiratory failure. On eval today she is a bit more awake, opens eyes but will not follow commands. Tolerates some PSV. Her UOP has not picked up yet post discontinuation HD. We will check a bladder scan. Hopefully MS will clear as metabolic staus improves. Independent critical care time is 35 minutes.   Baltazar Apo, MD, PhD 06/28/2016, 12:43 PM [facility] [phone] or if no answer [phone]

## 2016-06-28 NOTE — Progress Notes (Signed)
Subjective:   CRRT stopped yest- no UOP even with 160 of lasix challenge Objective Vital signs in last 24 hours: Vitals:   06/28/16 0409 06/28/16 0500 06/28/16 0600 06/28/16 0700  BP:  107/60 (!) 105/55 105/61  Pulse:  92 90 91  Resp:  (!) 22 (!) 25 (!) 25  Temp: 99.5 F (37.5 C)     TempSrc: Oral     SpO2:  100% 99% 99%  Weight:  109.6 kg (241 lb 10 oz)    Height:       Weight change: 1.5 kg (3 lb 4.9 oz)  Intake/Output Summary (Last 24 hours) at 06/28/16 0746 Last data filed at 06/28/16 0700  Gross per 24 hour  Intake          1252.67 ml  Output              138 ml  Net          1114.67 ml    Assessment/ Plan: Pt is a 65 y.o. yo female with CVA, DM, indwelling foley and severely debilitated at baseline- creatinine 1.55 ? Although AKI in past - crt in the high 2's in November 2016- who was admitted on 06/20/2016 with  Shock/sepsis, lactic acidosis with A on CRF Assessment/Plan: 1. Renal- A on CRF- on CRRT  9/30-10/3 - no indication for HD today- will continue to monitor labs and UOP.  In looking at history not sure if patient is a great candidate for chronic HD so hope to see some signs of recovery.   2. Shock- per CCM- thought to be sepsis- now of abx - previous vanc- seems to be improving from that standpoint 3. Anemia- hgb 8's but stable- added ESA - transfuse PRN 4. HTN/volume- overloaded/third spacing but also CVP in low teens when CRRT stopped-did not respond to lasix challenge.  No absolute need for volume removal at this time- cont to watch 5. VDRF- apparently MS not able to extubate- CT with atrophy and vascular dz- will be additional issue  Angelica Beck A    Labs: Basic Metabolic Panel:  Recent Labs Lab 06/26/16 1600 06/27/16 0420 06/28/16 0355  NA 138 135 138  K 4.3 4.1 4.5  CL 103 102 101  CO2 '25 27 27  ' GLUCOSE 145* 140* 152*  BUN 14 12 27*  CREATININE 1.37* 1.07* 1.97*  CALCIUM 7.8* 8.0* 8.3*  PHOS 1.5* 1.4* 3.1   Liver Function  Tests:  Recent Labs Lab 06/22/16 0333  06/25/16 0650  06/26/16 1600 06/27/16 0420 06/28/16 0355  AST 40  --  <5*  --   --   --   --   ALT 15  --  91*  --   --   --   --   ALKPHOS 61  --  300*  --   --   --   --   BILITOT 0.7  --  1.3*  --   --   --   --   PROT 5.2*  --  5.2*  --   --   --   --   ALBUMIN 2.2*  < > 1.9*  < > 1.8* 1.9* 1.6*  < > = values in this interval not displayed. No results for input(s): LIPASE, AMYLASE in the last 168 hours.  Recent Labs Lab 06/22/16 0334 06/23/16 0332  AMMONIA 72* 60*   CBC:  Recent Labs Lab 06/24/16 0435 06/25/16 0400 06/26/16 0413 06/27/16 0420 06/28/16 0355  WBC 19.5* 21.6* 26.2* 30.6* 29.6*  NEUTROABS 14.9* 18.5* 23.6* 26.6* 24.8*  HGB 8.2* 8.7* 8.7* 8.7* 7.9*  HCT 24.5* 26.1* 26.7* 27.5* 24.6*  MCV 82.5 81.3 82.4 85.1 83.7  PLT 200 197 178 155 121*   Cardiac Enzymes:  Recent Labs Lab 06/21/16 0900  TROPONINI 0.06*   CBG:  Recent Labs Lab 06/27/16 1135 06/27/16 1535 06/27/16 1958 06/28/16 0010 06/28/16 0409  GLUCAP 154* 172* 158* 153* 143*    Iron Studies: No results for input(s): IRON, TIBC, TRANSFERRIN, FERRITIN in the last 72 hours. Studies/Results: Ct Head Wo Contrast  Result Date: 06/27/2016 CLINICAL DATA:  Encephalopathy EXAM: CT HEAD WITHOUT CONTRAST TECHNIQUE: Contiguous axial images were obtained from the base of the skull through the vertex without intravenous contrast. COMPARISON:  June 21, 2016 FINDINGS: Brain: Moderate diffuse atrophy is stable. There is no intracranial mass, hemorrhage, extra-axial fluid collection, or midline shift. There is extensive small vessel disease. And the centra semiovale bilaterally, stable. There is a prior lacunar infarct in the anterior limb of the internal capsule, stable. There is evidence of a prior small lacunar infarct in the lateral left cerebellum. There is a prior lacunar infarct in the genu of the right internal capsule. Small vessel disease is noted in  each thalamus region. No acute appearing infarct is evident. Vascular: No hyperdense vessels are evident. There is calcification in each carotid siphon region as well as in each distal vertebral artery. Skull: The bony calvarium appears intact. Sinuses/Orbits: There is mild mucosal thickening in a left ethmoid air cell. Visualized paranasal sinuses elsewhere clear. Visualized orbits appear symmetric bilaterally. Other: Mastoid air cells are clear. There is debris in each external auditory canal. IMPRESSION: Atrophy with extensive supratentorial small vessel disease. Prior lacunar type infarcts evident. No acute infarct evident. No hemorrhage or mass. There are foci of arterial vascular calcification. There is mild left ethmoid sinus disease. There is probable cerumen in each external auditory canal. Electronically Signed   By: Lowella Grip III M.D.   On: 06/27/2016 17:42   Dg Chest Port 1 View  Result Date: 06/27/2016 CLINICAL DATA:  Respiratory failure. EXAM: PORTABLE CHEST 1 VIEW COMPARISON:  06/26/2016. FINDINGS: Endotracheal tube, left IJ line, NG tube in stable position. Heart size stable. Low lung volumes with mild bibasilar atelectasis. No pleural effusion or pneumothorax. IMPRESSION: 1. Lines and tubes in stable position. 2. Low lung volumes with mild bibasilar atelectasis and/or infiltrates. Electronically Signed   By: Marcello Moores  Register   On: 06/27/2016 07:13   Medications: Infusions:    Scheduled Medications: . aspirin  150 mg Rectal Daily  . chlorhexidine gluconate (MEDLINE KIT)  15 mL Mouth Rinse BID  . darbepoetin (ARANESP) injection - DIALYSIS  100 mcg Intravenous Q Mon-HD  . famotidine (PEPCID) IV  20 mg Intravenous Q24H  . feeding supplement (VITAL HIGH PROTEIN)  1,000 mL Per Tube Q24H  . heparin  5,000 Units Subcutaneous Q8H  . insulin aspart  0-9 Units Subcutaneous Q4H  . mouth rinse  15 mL Mouth Rinse QID    have reviewed scheduled and prn medications.  Physical  Exam: General: sedated . intubated Heart: RRR Lungs: CBS bilat Abdomen: obese, distended Extremities: pitting edema Dialysis Access: right IJ vascath- placed 9/30     06/28/2016,7:46 AM  LOS: 8 days

## 2016-06-28 NOTE — Progress Notes (Signed)
Nutrition Follow-up  DOCUMENTATION CODES:   Obesity unspecified  INTERVENTION:    Increase Vital High Protein by 10 ml every 4 hours to goal rate of 60 ml/h (1440 ml) to provide 1440 kcal, 126 gm protein, 1204 ml free water daily.  NUTRITION DIAGNOSIS:   Inadequate oral intake related to inability to eat as evidenced by NPO status.  Ongoing  GOAL:   Provide needs based on ASPEN/SCCM guidelines  Unmet  MONITOR:   Vent status, Labs, TF tolerance, Skin, I & O's  REASON FOR ASSESSMENT:   Consult Enteral/tube feeding initiation and management  ASSESSMENT:   65-y/o female with T2DM, CKD, recurrent UTIs 2/2 incontinence, and stroke with subsequent severe debilitation (ECOG 4), who presented with AMS and found to be in shock, likely 2/2 UTI sepsis.   Discussed patient in ICU rounds and with RN today. CRRT was stopped yesterday. Patient has been tolerating trickle TF of Vital High Protein at 20 ml/h since Sunday. RD to advance to goal rate today. Labs reviewed: magnesium is low Medications reviewed and include insulin. Patient is currently intubated on ventilator support Temp (24hrs), Avg:99.3 F (37.4 C), Min:98.4 F (36.9 C), Max:99.9 F (37.7 C)   Diet Order:  Diet NPO time specified  Skin:  Wound (see comment) (stage II pressure injury to lip)  Last BM:  9/28  Height:   Ht Readings from Last 1 Encounters:  06/20/16 '5\' 6"'$  (1.676 m)    Weight:   Wt Readings from Last 1 Encounters:  06/28/16 241 lb 10 oz (109.6 kg)    Ideal Body Weight:  59.1 kg  BMI:  Body mass index is 39 kg/m.  Estimated Nutritional Needs:   Kcal:  3748-2707  Protein:  >/= 118 gm  Fluid:  2 L  EDUCATION NEEDS:   No education needs identified at this time  Molli Barrows, [place], [place], [place] Pager [phone] After Hours Pager [phone]

## 2016-06-29 ENCOUNTER — Inpatient Hospital Stay (HOSPITAL_COMMUNITY): Payer: Medicare Other

## 2016-06-29 LAB — GLUCOSE, CAPILLARY
GLUCOSE-CAPILLARY: 172 mg/dL — AB (ref 65–99)
GLUCOSE-CAPILLARY: 164 mg/dL — AB (ref 65–99)
GLUCOSE-CAPILLARY: 102 mg/dL — AB (ref 65–99)
Glucose-Capillary: 146 mg/dL — ABNORMAL HIGH (ref 65–99)
Glucose-Capillary: 124 mg/dL — ABNORMAL HIGH (ref 65–99)

## 2016-06-29 LAB — RENAL FUNCTION PANEL
Albumin: 1.5 g/dL — ABNORMAL LOW (ref 3.5–5.0)
Anion gap: 11 (ref 5–15)
BUN: 48 mg/dL — AB (ref 6–20)
CHLORIDE: 101 mmol/L (ref 101–111)
CO2: 25 mmol/L (ref 22–32)
CREATININE: 3.17 mg/dL — AB (ref 0.44–1.00)
Calcium: 8.3 mg/dL — ABNORMAL LOW (ref 8.9–10.3)
GFR calc Af Amer: 17 mL/min — ABNORMAL LOW (ref >60)
GFR, EST NON AFRICAN AMERICAN: 15 mL/min — AB (ref >60)
GLUCOSE: 182 mg/dL — AB (ref 65–99)
POTASSIUM: 4.8 mmol/L (ref 3.5–5.1)
Phosphorus: 4.1 mg/dL (ref 2.5–4.6)
Sodium: 137 mmol/L (ref 135–145)

## 2016-06-29 LAB — CBC WITH DIFFERENTIAL/PLATELET
BASOS PCT: 0 %
Basophils Absolute: 0 10*3/uL (ref 0.0–0.1)
EOS ABS: 0 10*3/uL (ref 0.0–0.7)
Eosinophils Relative: 0 %
HCT: 22.6 % — ABNORMAL LOW (ref 36.0–46.0)
Hemoglobin: 7.4 g/dL — ABNORMAL LOW (ref 12.0–15.0)
LYMPHS ABS: 1.7 10*3/uL (ref 0.7–4.0)
LYMPHS PCT: 6 %
MCH: 27.3 pg (ref 26.0–34.0)
MCHC: 32.7 g/dL (ref 30.0–36.0)
MCV: 83.4 fL (ref 78.0–100.0)
Monocytes Absolute: 4 10*3/uL — ABNORMAL HIGH (ref 0.1–1.0)
Monocytes Relative: 14 %
NEUTROS ABS: 23.2 10*3/uL — AB (ref 1.7–7.7)
Neutrophils Relative %: 80 %
PLATELETS: 96 10*3/uL — AB (ref 150–400)
RBC: 2.71 MIL/uL — ABNORMAL LOW (ref 3.87–5.11)
RDW: 15.3 % (ref 11.5–15.5)
WBC: 28.9 10*3/uL — ABNORMAL HIGH (ref 4.0–10.5)

## 2016-06-29 LAB — PROTIME-INR
INR: 1.38
Prothrombin Time: 17.1 seconds — ABNORMAL HIGH (ref 11.4–15.2)

## 2016-06-29 LAB — MAGNESIUM: MAGNESIUM: 2.5 mg/dL — AB (ref 1.7–2.4)

## 2016-06-29 LAB — AMMONIA: Ammonia: 21 umol/L (ref 9–35)

## 2016-06-29 MED ORDER — LIDOCAINE-PRILOCAINE 2.5-2.5 % EX CREA
1.0000 "application " | TOPICAL_CREAM | CUTANEOUS | Status: DC | PRN
  Filled 2016-06-29: qty 5

## 2016-06-29 MED ORDER — HEPARIN SODIUM (PORCINE) 1000 UNIT/ML DIALYSIS: 1000.0000 [IU] | INTRAMUSCULAR | Status: DC | PRN

## 2016-06-29 MED ORDER — ALBUMIN HUMAN 25 % IV SOLN
INTRAVENOUS | Status: AC
  Administered 2016-06-29: 25 g
  Filled 2016-06-29: qty 100

## 2016-06-29 MED ORDER — SODIUM CHLORIDE 0.9 % IV SOLN: 100.0000 mL | INTRAVENOUS | Status: DC | PRN

## 2016-06-29 MED ORDER — PENTAFLUOROPROP-TETRAFLUOROETH EX AERO: 1.0000 "application " | INHALATION_SPRAY | CUTANEOUS | Status: DC | PRN

## 2016-06-29 MED ORDER — ALTEPLASE 2 MG IJ SOLR
2.0000 mg | Freq: Once | INTRAMUSCULAR | Status: DC | PRN
  Filled 2016-06-29: qty 2

## 2016-06-29 MED ORDER — LIDOCAINE HCL (PF) 1 % IJ SOLN: 5.0000 mL | INTRAMUSCULAR | Status: DC | PRN

## 2016-06-29 NOTE — Progress Notes (Signed)
Subjective:   No UOP- BP OK no pressors Objective Vital signs in last 24 hours: Vitals:   06/29/16 0400 06/29/16 0402 06/29/16 0500 06/29/16 0600  BP: (!) 103/47  (!) 97/50 (!) 101/55  Pulse: 89 92 85 82  Resp: (!) 25 16 (!) 24 (!) 22  Temp:      TempSrc:      SpO2: 100% 100% 100% 98%  Weight:   108.1 kg (238 lb 5.1 oz)   Height:       Weight change: -1.5 kg (-3 lb 4.9 oz)  Intake/Output Summary (Last 24 hours) at 06/29/16 1224 Last data filed at 06/29/16 0600  Gross per 24 hour  Intake             1336 ml  Output                0 ml  Net             1336 ml    Assessment/ Plan: Pt is a 65 y.o. yo female with morbid obestity, CVA's in past, DM, indwelling foley and severely debilitated at baseline- creatinine 1.55 ? Although AKI in past - crt in the high 2's in November 2016- who was admitted on 06/20/2016 with  Shock/sepsis, lactic acidosis with A on CRF Assessment/Plan: 1. Renal- A on CRF- on CRRT  9/30-10/3 - no  UOP so am afraid that we will need to continue with HD support for now, plan for IHD today.  In looking at history not sure if patient is a great candidate for chronic HD so hope to see some signs of recovery.   2. Shock- per CCM- thought to be sepsis- now off abx - previous vanc- seems to be improving from that standpoint 3. Anemia- hgb 8's- now 7's drifting down- added ESA - transfuse PRN 4. HTN/volume- overloaded/third spacing but also CVP in low teens when CRRT stopped-did not respond to lasix challenge.  Minimal UF today with HD 5. VDRF- apparently MS not able to extubate- CT with atrophy and vascular dz- will be additional issue  Deyna Carbon A    Labs: Basic Metabolic Panel:  Recent Labs Lab 06/27/16 0420 06/28/16 0355 06/29/16 0400  NA 135 138 137  K 4.1 4.5 4.8  CL 102 101 101  CO2 '27 27 25  '$ GLUCOSE 140* 152* 182*  BUN 12 27* 48*  CREATININE 1.07* 1.97* 3.17*  CALCIUM 8.0* 8.3* 8.3*  PHOS 1.4* 3.1 4.1   Liver Function Tests:  Recent  Labs Lab 06/25/16 0650  06/28/16 0355 06/28/16 1831 06/29/16 0400  AST <5*  --   --  2,157*  --   ALT 91*  --   --  591*  --   ALKPHOS 300*  --   --  639*  --   BILITOT 1.3*  --   --  0.9  --   PROT 5.2*  --   --  5.3*  --   ALBUMIN 1.9*  < > 1.6* 1.5* 1.5*  < > = values in this interval not displayed. No results for input(s): LIPASE, AMYLASE in the last 168 hours.  Recent Labs Lab 06/23/16 0332  AMMONIA 60*   CBC:  Recent Labs Lab 06/25/16 0400 06/26/16 0413 06/27/16 0420 06/27/16 1536 06/28/16 0355 06/29/16 0400  WBC 21.6* 26.2* 30.6*  --  29.6* 28.9*  NEUTROABS 18.5* 23.6* 26.6*  --  24.8* 23.2*  HGB 8.7* 8.7* 8.7*  --  7.9* 7.4*  HCT 26.1* 26.7* 27.5* 26.8*  24.6* 22.6*  MCV 81.3 82.4 85.1  --  83.7 83.4  PLT 197 178 155  --  121* 96*   Cardiac Enzymes: No results for input(s): CKTOTAL, CKMB, CKMBINDEX, TROPONINI in the last 168 hours. CBG:  Recent Labs Lab 06/28/16 1128 06/28/16 1519 06/28/16 1942 06/28/16 2327 06/29/16 0326  GLUCAP 122* 113* 127* 150* 172*    Iron Studies: No results for input(s): IRON, TIBC, TRANSFERRIN, FERRITIN in the last 72 hours. Studies/Results: Ct Head Wo Contrast  Result Date: 06/27/2016 CLINICAL DATA:  Encephalopathy EXAM: CT HEAD WITHOUT CONTRAST TECHNIQUE: Contiguous axial images were obtained from the base of the skull through the vertex without intravenous contrast. COMPARISON:  June 21, 2016 FINDINGS: Brain: Moderate diffuse atrophy is stable. There is no intracranial mass, hemorrhage, extra-axial fluid collection, or midline shift. There is extensive small vessel disease. And the centra semiovale bilaterally, stable. There is a prior lacunar infarct in the anterior limb of the internal capsule, stable. There is evidence of a prior small lacunar infarct in the lateral left cerebellum. There is a prior lacunar infarct in the genu of the right internal capsule. Small vessel disease is noted in each thalamus region. No  acute appearing infarct is evident. Vascular: No hyperdense vessels are evident. There is calcification in each carotid siphon region as well as in each distal vertebral artery. Skull: The bony calvarium appears intact. Sinuses/Orbits: There is mild mucosal thickening in a left ethmoid air cell. Visualized paranasal sinuses elsewhere clear. Visualized orbits appear symmetric bilaterally. Other: Mastoid air cells are clear. There is debris in each external auditory canal. IMPRESSION: Atrophy with extensive supratentorial small vessel disease. Prior lacunar type infarcts evident. No acute infarct evident. No hemorrhage or mass. There are foci of arterial vascular calcification. There is mild left ethmoid sinus disease. There is probable cerumen in each external auditory canal. Electronically Signed   By: Lowella Grip III M.D.   On: 06/27/2016 17:42   Medications: Infusions:    Scheduled Medications: . aspirin  150 mg Rectal Daily  . darbepoetin (ARANESP) injection - DIALYSIS  100 mcg Intravenous Q Mon-HD  . famotidine (PEPCID) IV  20 mg Intravenous Q24H  . feeding supplement (VITAL HIGH PROTEIN)  1,000 mL Per Tube Q24H  . heparin  5,000 Units Subcutaneous Q8H  . insulin aspart  0-9 Units Subcutaneous Q4H    have reviewed scheduled and prn medications.  Physical Exam: General: sedated . Intubated- eyes open but no commands Heart: RRR Lungs: CBS bilat Abdomen: obese, distended Extremities: pitting edema Dialysis Access: right IJ vascath- placed 9/30     06/29/2016,6:58 AM  LOS: 9 days

## 2016-06-29 NOTE — Progress Notes (Signed)
PULMONARY / CRITICAL CARE MEDICINE   Name: Angelica Beck MRN: [id number] DOB: 1951-05-13    ADMISSION DATE:  06/20/2016 CONSULTATION DATE:  06/20/2016  REFERRING MD:  EDP Dr Cathleen Fears   CHIEF COMPLAINT:  AMS  HISTORY OF PRESENT ILLNESS:   65 year old female with past medical history as below, which is significant for stroke with subsequent debilitation, diabetes, chronic kidney disease, chronic urinary tract infections due to incontinence, for which she takes chronic antibiotics. She lives at home with her daughter where she requires full assistance she is unable to truly ambulate and spends most of her time in a hospital bed/recliner. She relies on family members almost entirely for activities of daily living. She was in her usual state of health until about 9/23 when she developed nausea and vomiting. This persisted for 2 days until symptoms subsided, however, she continued to complain of abdominal pain. 9/26 when the patient's daughter got home from work the patient was found to be unresponsive. EMS was called and she was found hypoglycemic which was corrected, however, altered mental status did not improve. Upon arrival to the emergency department she continued to be lethargic and was found to be hypotensive with hypoxemia. Laboratory evaluation significant for potassium 7.4, bicarbonate less than 7, creatinine 6.51, BUN 95, WBC 12.3, lactic acid 13.88, pH 7.07.  SUBJECTIVE:  Remains intubated. Opens eyes when addressed.  VITAL SIGNS: BP (!) 101/55   Pulse 82   Temp 98.9 F (37.2 C) (Oral)   Resp (!) 22   Ht '5\' 6"'$  (1.676 m)   Wt 108.1 kg (238 lb 5.1 oz)   SpO2 98%   BMI 38.47 kg/m   HEMODYNAMICS: CVP:  [7 mmHg] 7 mmHg  VENTILATOR SETTINGS: Vent Mode: PRVC FiO2 (%):  [40 %] 40 % Set Rate:  [20 bmp] 20 bmp Vt Set:  [470 mL] 470 mL PEEP:  [5 cmH20] 5 cmH20 Pressure Support:  [10 cmH20] 10 cmH20 Plateau Pressure:  [18 cmH20] 18 cmH20  INTAKE / OUTPUT: I/O last 3 completed  shifts: In: 1696 [I.V.:350; Other:6; NG/GT:1290; IV Piggyback:50] Out: -   PHYSICAL EXAMINATION: General:  Obese female. Chronically ill appearing. Neuro: Opens eyes to voice today, still not following commands for this examiner. HEENT:  No scleral icterus or injection. ETT in place. Dried blood noted on lips and in oral cavity. Cardiovascular:  Regular rhythm and rate. 1+ LE bilaterally to knee. Unable to appreciate JVD. Lungs: Decreased breath sounds at bases bilaterally.  Symmetric chest rise on ventilator.  Abdomen:  Soft. Protuberant. Hypoactive BS. Patient grimaces with deep palpation.  Musculoskeletal:  No joint deformity or effusion. No crepitus. Skin:  Warm & dry. Ecchymosis over left forearm.  Swollen hands and feet.   LABS:  BMET  Recent Labs Lab 06/27/16 0420 06/28/16 0355 06/29/16 0400  NA 135 138 137  K 4.1 4.5 4.8  CL 102 101 101  CO2 '27 27 25  '$ BUN 12 27* 48*  CREATININE 1.07* 1.97* 3.17*  GLUCOSE 140* 152* 182*    Electrolytes  Recent Labs Lab 06/27/16 0420 06/28/16 0355 06/29/16 0400  CALCIUM 8.0* 8.3* 8.3*  MG 2.7* 2.5* 2.5*  PHOS 1.4* 3.1 4.1    CBC  Recent Labs Lab 06/27/16 0420 06/27/16 1536 06/28/16 0355 06/29/16 0400  WBC 30.6*  --  29.6* 28.9*  HGB 8.7*  --  7.9* 7.4*  HCT 27.5* 26.8* 24.6* 22.6*  PLT 155  --  121* 96*    Coag's  Recent Labs Lab 06/25/16 0400 06/25/16  8119 06/26/16 0413 06/27/16 0420  APTT 147*  --  175* 151*  INR  --  1.76  --   --     Sepsis Markers  Recent Labs Lab 06/25/16 0415 06/25/16 1302 06/26/16 0413  LATICACIDVEN 9.2* 3.9* 1.8    ABG  Recent Labs Lab 06/24/16 0400 06/24/16 1605 06/25/16 0355  PHART 7.492* 7.486* 7.420  PCO2ART 22.8* 23.2* 31.4*  PO2ART 154* 170* 71.5*    Liver Enzymes  Recent Labs Lab 06/25/16 0650  06/28/16 0355 06/28/16 1831 06/29/16 0400  AST <5*  --   --  2,157*  --   ALT 91*  --   --  591*  --   ALKPHOS 300*  --   --  639*  --   BILITOT 1.3*   --   --  0.9  --   ALBUMIN 1.9*  < > 1.6* 1.5* 1.5*  < > = values in this interval not displayed.  Cardiac Enzymes No results for input(s): TROPONINI, PROBNP in the last 168 hours.  Glucose  Recent Labs Lab 06/28/16 1128 06/28/16 1519 06/28/16 1942 06/28/16 2327 06/29/16 0326 06/29/16 0743  GLUCAP 122* 113* 127* 150* 172* 164*    Imaging No results found.  STUDIES:  CT Head 9/27:  No acute abnormality. Chronic small vessel ischemic changes. Chronic lacunar infarct left basal ganglia & chronic ischemia right side of pons.  CT Abd/Pelvis w/o 9/27:  Early partial SBO w/ transition point LLQ with adhesions. Left humerus soft tissue swelling & intramuscular gas.  Left Arm X-ray 9/27:  Diffuse soft tissue edema.  TTE 9/27:  LV w/ EF 60-65%. Grade 1 Diastolic dysfunction. No regional wall motion abnormalities. LA & RA normal in size. RV normal in size & function. Mild AS w/o AR. Mild MR w/o MS.  ABIs 10/3: R normal. L posterior artery noncompressible; suspect calcification.  CT head 10/3: No acute infarct, no hemorrhage or mass. Atrophy evident. Similar to prior study.   MICROBIOLOGY: MRSA PCR 9/27:  Negative Blood Ctx x 2 9/26 >> Coag Neg Staph 1/2 Urine Ctx 9/26:  5000 CFU E coli & 1000 CFU Klebsiella   ANTIBIOTICS: Levaquin 9/26 - 9/28 Aztreonam 9/26 - 9/27 Vancomycin 9/26 >> 9/30 Merrem 9/27 >> 10/3  SIGNIFICANT EVENTS: 9/26 - admit for presumed septic shock  LINES/TUBES: OETT 7.5 9/26 >> L IJ CVL 9/26 >> Foley 9/26 >> RIJ HD 9/30 >>  DISCUSSION: Nathifa Ritthaler is a 65-y/o female with T2DM, CKD, recurrent UTIs 2/2 incontinence, and stroke with subsequent severe debilitation (ECOG 4), who presented with AMS and found to be in shock, likely 2/2 UTI sepsis versus metformin toxicity. Some improvement in level of alertness with CRRT. Completed 7-day course of meropenem 10/3.   ASSESSMENT / PLAN:  PULMONARY A: Acute Hypoxic Respiratory Failure - Unable to protect  airway with acute encephalopathy.  OHS P:   SBT as tolerates, not awake enough this AM to consider extubation however. CXR intermittently.  CARDIOVASCULAR A:  Shock - Likely sepsis. Cortisol 45.1. Resolved. H/O HTN & HLD P:  Monitor hemodynamics.  RENAL A:   Acute On Chronic Renal Failure Stage IV   Profound Metabolic Acidosis  Hyperkalemia - Resolved.  Lactic Acidosis, suspect due to hypoperfusion and also to metformin, persisted likely due to poor renal clearance.  Resolved 10/2 after CRRT. Hyperphosphatemia - Secondary to acute renal failure. Resolved. Pseudohypocalcemia - Improving.  P:   Monitoring UOP with Foley, minimal. Lasix given by Renal 10/3 without improved UOP. No  improvement after flushing foley. No retention seen on bladder scan 10/4.  CRRT ended 10/3, reinitiate iHD today, 10/5  GASTROINTESTINAL A:   Partial SBO - resolving on XR abd 9/30. H/O Ventral Hernia Repair. Possible Liver Cirrhosis. Sludge on CT abdomen 9/27.  Transaminitis worse on recheck 10/4 2/2 shock vs metoblic toxin P:   On trickle feeds. Pepcid IV daily.  Obtain RUQ U/S.  Low threshold to restart lactulose, check ammonia now  HEMATOLOGIC A:   Anemia - Chronic. No signs of active bleeding. Hgb down today to 7.4 < 7.9 < 8.7. Leukocytosis - Likely due to sepsis. Improving slowly. Thrombocytopenia  P:  SCD's, stop heparin 10/5, check HITT panel CBC in AM. Transfusion goal of 7.0.  Obtain INR.   INFECTIOUS A:   Severe Sepsis - due to E.coli and Klebsiella UTI. Unlikely Coag Neg Staph Bacteremia, suspect contaminant P:   Completed course of meropenem Following cultures to completion. Holding home Trimethoprim.  ENDOCRINE A:   Hypoglycemia - Resolved with Dextrose IVF. H/O DM Type 2 - A1c 5.8. H/O Hyperthyroidism - On PTU at home. TSH 0.331 & F T4 1.33 9/26. Repeat TSH 2.044 10/3.  P:   SSI per Sensitive Algorithm. Holding PTU & Metformin.  NEUROLOGIC A:   Acute  Encephalopathy - Question hepatic vs toxic metabolic vs hypoxia. No improvement in Lactulose Enema despite improved Ammonia.  Favor toxic metabolic as is gradually improving with CRRT. Repeat CT head unchanged. TSH WNL. Folate WNL.  H/O CVA - Minimal mobility. P:   RASS goal: 0 to -1 Avoid sedating meds.  MUSCULOSKELETAL A: Edema Left Arm - Improved. Secondary to IV infiltration. No gas on X-ray 9/27. P: Monitor closely - no crepitus  FAMILY  - Updates: None available 10/5.  - Inter-disciplinary family meet or Palliative Care meeting due by: Spoke with daughter at bedside 10/4  Olene Floss, MD [place], PGY-2   Attending Note:  I have examined patient, reviewed labs, studies and notes. I have discussed the case with Dr  Ola Spurr, and I agree with the data and plans as amended above. 66 yo woman, hx CVA, dependent for most of her care, admitted with severe lactic acidosis possibly due to metformin + septic shock. Source unclear - ? UTI or cholecystitis. Course c/b severe encephalopathy and acute respiratory failure. On eval today she continues to slowly be more awake, opens eyes but will not follow commands. Tolerates PSV. Her UOP has not picked up yet post discontinuation HD so will restart today. Hopefully MS will clear as metabolic staus improves. Recheck RUQ [place] to eval for stones, septic source. Stop heparin and check HITT panel given dropping plt. ? Due to sepsis. Independent critical care time is 35 minutes.   Baltazar Apo, MD, PhD 06/29/2016, 11:41 AM [facility] [phone] or if no answer [phone]

## 2016-06-29 NOTE — Progress Notes (Signed)
Spoke with patient's daughter. Updated her on plan to do iHD and get RUQ U/S.

## 2016-06-30 LAB — GLUCOSE, CAPILLARY
GLUCOSE-CAPILLARY: 102 mg/dL — AB (ref 65–99)
GLUCOSE-CAPILLARY: 121 mg/dL — AB (ref 65–99)
GLUCOSE-CAPILLARY: 161 mg/dL — AB (ref 65–99)
GLUCOSE-CAPILLARY: 180 mg/dL — AB (ref 65–99)
GLUCOSE-CAPILLARY: 182 mg/dL — AB (ref 65–99)
Glucose-Capillary: 146 mg/dL — ABNORMAL HIGH (ref 65–99)
Glucose-Capillary: 181 mg/dL — ABNORMAL HIGH (ref 65–99)

## 2016-06-30 LAB — CBC WITH DIFFERENTIAL/PLATELET
BASOS ABS: 0 10*3/uL (ref 0.0–0.1)
Basophils Relative: 0 %
EOS ABS: 0.3 10*3/uL (ref 0.0–0.7)
Eosinophils Relative: 1 %
HEMATOCRIT: 20.6 % — AB (ref 36.0–46.0)
Hemoglobin: 6.8 g/dL — CL (ref 12.0–15.0)
LYMPHS ABS: 1.3 10*3/uL (ref 0.7–4.0)
Lymphocytes Relative: 5 %
MCH: 27.4 pg (ref 26.0–34.0)
MCHC: 33 g/dL (ref 30.0–36.0)
MCV: 83.1 fL (ref 78.0–100.0)
MONO ABS: 3.5 10*3/uL — AB (ref 0.1–1.0)
Monocytes Relative: 14 %
NEUTROS PCT: 80 %
Neutro Abs: 20.2 10*3/uL — ABNORMAL HIGH (ref 1.7–7.7)
PLATELETS: 104 10*3/uL — AB (ref 150–400)
RBC: 2.48 MIL/uL — AB (ref 3.87–5.11)
RDW: 15 % (ref 11.5–15.5)
WBC: 25.3 10*3/uL — AB (ref 4.0–10.5)

## 2016-06-30 LAB — RENAL FUNCTION PANEL
ALBUMIN: 2.1 g/dL — AB (ref 3.5–5.0)
ANION GAP: 14 (ref 5–15)
BUN: 34 mg/dL — AB (ref 6–20)
CALCIUM: 8.7 mg/dL — AB (ref 8.9–10.3)
CO2: 26 mmol/L (ref 22–32)
Chloride: 97 mmol/L — ABNORMAL LOW (ref 101–111)
Creatinine, Ser: 2.48 mg/dL — ABNORMAL HIGH (ref 0.44–1.00)
GFR calc Af Amer: 23 mL/min — ABNORMAL LOW (ref >60)
GFR calc non Af Amer: 20 mL/min — ABNORMAL LOW (ref >60)
GLUCOSE: 135 mg/dL — AB (ref 65–99)
Phosphorus: 3.4 mg/dL (ref 2.5–4.6)
Potassium: 4.1 mmol/L (ref 3.5–5.1)
SODIUM: 137 mmol/L (ref 135–145)

## 2016-06-30 LAB — HEPARIN INDUCED PLATELET AB (HIT ANTIBODY): HEPARIN INDUCED PLT AB: 0.427 {OD_unit} — AB (ref 0.000–0.400)

## 2016-06-30 LAB — HEPATIC FUNCTION PANEL
ALBUMIN: 2.1 g/dL — AB (ref 3.5–5.0)
ALK PHOS: 452 U/L — AB (ref 38–126)
ALT: 385 U/L — AB (ref 14–54)
AST: 1412 U/L — ABNORMAL HIGH (ref 15–41)
Bilirubin, Direct: 0.3 mg/dL (ref 0.1–0.5)
Indirect Bilirubin: 0.9 mg/dL (ref 0.3–0.9)
TOTAL PROTEIN: 5.5 g/dL — AB (ref 6.5–8.1)
Total Bilirubin: 1.2 mg/dL (ref 0.3–1.2)

## 2016-06-30 LAB — MAGNESIUM: Magnesium: 2.2 mg/dL (ref 1.7–2.4)

## 2016-06-30 LAB — HEMOGLOBIN AND HEMATOCRIT, BLOOD
HEMATOCRIT: 23.8 % — AB (ref 36.0–46.0)
Hemoglobin: 7.9 g/dL — ABNORMAL LOW (ref 12.0–15.0)

## 2016-06-30 LAB — PREPARE RBC (CROSSMATCH)

## 2016-06-30 MED ORDER — VITAL HIGH PROTEIN PO LIQD
1000.0000 mL | ORAL | Status: DC
  Administered 2016-06-30 – 2016-07-05 (×6): 1000 mL
  Administered 2016-07-07: 10:00:00
  Administered 2016-07-07: 1000 mL
  Administered 2016-07-08: 13:00:00
  Administered 2016-07-08 – 2016-07-10 (×2): 1000 mL
  Filled 2016-06-30 (×3): qty 1000

## 2016-06-30 MED ORDER — SODIUM CHLORIDE 0.9 % IV SOLN: Freq: Once | INTRAVENOUS | Status: AC

## 2016-06-30 NOTE — Progress Notes (Signed)
CRITICAL VALUE ALERT  Critical value received:  Hgb 6.8  Date of notification:  06/30/16  Time of notification:  0546  Critical value read back: yes  Nurse who received alert:  Izola Price S./ Arnell Sieving  MD notified (1st page):  Corrie Dandy  Time of first page:  [phone]  MD notified (2nd page):N/A  Time of second page:N/A  Responding MD:  Corrie Dandy  Time MD responded:  [phone]

## 2016-06-30 NOTE — Care Management Important Message (Signed)
Important Message  Patient Details  Name: Angelica Beck MRN: [id number] Date of Birth: 1951-06-12   Medicare Important Message Given:  Yes    Nathen May 06/30/2016, 12:45 PM

## 2016-06-30 NOTE — Progress Notes (Signed)
[place] Progress Note Patient Name: Angelica Beck DOB: 1951-07-16 MRN: [id number]   Date of Service  06/30/2016  HPI/Events of Note  Hb 6.8, plt 108. No active source of bleeding. Pt with CKD. 95/42,  88,  18,  100%sats  eICU Interventions  Transfuse 1upRBC     Intervention Category Major Interventions: Other:  [facility] 06/30/2016, 5:52 AM

## 2016-06-30 NOTE — Progress Notes (Signed)
Subjective:   Essentially No UOP- had HD yest- removed only 600 due to BP Objective Vital signs in last 24 hours: Vitals:   06/30/16 0615 06/30/16 0630 06/30/16 0645 06/30/16 0700  BP: (!) 89/46 139/65 125/66 133/63  Pulse: 84 86 84 83  Resp: (!) 21 (!) 22 20 (!) 21  Temp:      TempSrc:      SpO2: 100% 100% 100% 100%  Weight:      Height:       Weight change: 0 kg (0 lb)  Intake/Output Summary (Last 24 hours) at 06/30/16 0741 Last data filed at 06/30/16 0700  Gross per 24 hour  Intake              643 ml  Output              610 ml  Net               33 ml    Assessment/ Plan: Pt is a 65 y.o. yo female with morbid obestity, CVA's in past, DM, indwelling foley and severely debilitated at baseline- creatinine 1.55 ? Although AKI in past - crt in the high 2's in November 2016- who was admitted on 06/20/2016 with  Shock/sepsis, lactic acidosis with A on CRF Assessment/Plan: 1. Renal- A on CRF- on CRRT  9/30-10/3 - no  UOP  HD support for now, s/p IHD 10/5.  In looking at history not sure if patient is a great candidate for chronic HD- also sees Dr. Lorrene Reid in the office who agrees she would not be able to do OP HD due to severe debility which is only worse at this point. So hope to see some signs of recovery- if not discussions need to be held.  No indications for HD today.  VC in since 9/30- at time that line would need to be changed might be a good time to have discussions if not recovering by then  2. Shock- per CCM- thought to be sepsis- now off abx - previous vanc- seems to be improving from that standpoint 3. Anemia- hgb 8's- now 7's drifting down- added ESA - transfuse PRN- hgb 6.8- transfused today  4. HTN/volume- overloaded/third spacing but also CVP in low teens when CRRT stopped-did not respond to lasix challenge.   5. VDRF- apparently MS not able to extubate- CT with atrophy and vascular dz- will be additional issue.  According to Dr. Lorrene Reid very demented at  [place]: Basic Metabolic Panel:  Recent Labs Lab 06/28/16 0355 06/29/16 0400 06/30/16 0450  NA 138 137 137  K 4.5 4.8 4.1  CL 101 101 97*  CO2 '27 25 26  '$ GLUCOSE 152* 182* 135*  BUN 27* 48* 34*  CREATININE 1.97* 3.17* 2.48*  CALCIUM 8.3* 8.3* 8.7*  PHOS 3.1 4.1 3.4   Liver Function Tests:  Recent Labs Lab 06/25/16 0650  06/28/16 1831 06/29/16 0400 06/30/16 0450 06/30/16 0455  AST <5*  --  2,157*  --   --  1,412*  ALT 91*  --  591*  --   --  385*  ALKPHOS 300*  --  639*  --   --  452*  BILITOT 1.3*  --  0.9  --   --  1.2  PROT 5.2*  --  5.3*  --   --  5.5*  ALBUMIN 1.9*  < > 1.5* 1.5* 2.1* 2.1*  < > = values in this interval not displayed. No  results for input(s): LIPASE, AMYLASE in the last 168 hours.  Recent Labs Lab 06/29/16 1130  AMMONIA 21   CBC:  Recent Labs Lab 06/26/16 0413 06/27/16 0420  06/28/16 0355 06/29/16 0400 06/30/16 0455  WBC 26.2* 30.6*  --  29.6* 28.9* 25.3*  NEUTROABS 23.6* 26.6*  --  24.8* 23.2* 20.2*  HGB 8.7* 8.7*  --  7.9* 7.4* 6.8*  HCT 26.7* 27.5*  < > 24.6* 22.6* 20.6*  MCV 82.4 85.1  --  83.7 83.4 83.1  PLT 178 155  --  121* 96* 104*  < > = values in this interval not displayed. Cardiac Enzymes: No results for input(s): CKTOTAL, CKMB, CKMBINDEX, TROPONINI in the last 168 hours. CBG:  Recent Labs Lab 06/29/16 1138 06/29/16 1544 06/29/16 1941 06/29/16 2337 06/30/16 0333  GLUCAP 146* 124* 102* 102* 121*    Iron Studies: No results for input(s): IRON, TIBC, TRANSFERRIN, FERRITIN in the last 72 hours. Studies/Results: US Abdomen Limited Ruq  Result Date: 06/29/2016 CLINICAL DATA:  Transaminitis, hypertension, diabetes mellitus, chronic kidney disease EXAM: US ABDOMEN LIMITED - RIGHT UPPER QUADRANT COMPARISON:  CT abdomen and pelvis 06/21/2016 FINDINGS: Gallbladder: Normally distended without stones or wall thickening. No pericholecystic fluid. Unable to assess for sonographic Murphy  sign, patient on ventilator. Common bile duct: Diameter: 4 mm diameter , normal Liver: Normal appearance No RIGHT upper quadrant free fluid. IMPRESSION: No acute abnormalities. Electronically Signed   By: Lavonia Dana M.D.   On: 06/29/2016 19:14   Medications: Infusions:    Scheduled Medications: . aspirin  150 mg Rectal Daily  . darbepoetin (ARANESP) injection - DIALYSIS  100 mcg Intravenous Q Mon-HD  . famotidine (PEPCID) IV  20 mg Intravenous Q24H  . feeding supplement (VITAL HIGH PROTEIN)  1,000 mL Per Tube Q24H  . insulin aspart  0-9 Units Subcutaneous Q4H    have reviewed scheduled and prn medications.  Physical Exam: General:  Intubated- eyes open but no commands Heart: RRR Lungs: CBS bilat Abdomen: obese, distended Extremities: pitting edema Dialysis Access: right IJ vascath- placed 9/30     06/30/2016,7:41 AM  LOS: 10 days

## 2016-06-30 NOTE — Progress Notes (Signed)
PULMONARY / CRITICAL CARE MEDICINE   Name: Angelica Beck MRN: [id number] DOB: January 19, 1951    ADMISSION DATE:  06/20/2016 CONSULTATION DATE:  06/20/2016  REFERRING MD:  EDP Dr Cathleen Fears   CHIEF COMPLAINT:  AMS  HISTORY OF PRESENT ILLNESS:   65 year old female with past medical history as below, which is significant for stroke with subsequent debilitation, diabetes, chronic kidney disease, chronic urinary tract infections due to incontinence, for which she takes chronic antibiotics. She lives at home with her daughter where she requires full assistance she is unable to truly ambulate and spends most of her time in a hospital bed/recliner. She relies on family members almost entirely for activities of daily living. She was in her usual state of health until about 9/23 when she developed nausea and vomiting. This persisted for 2 days until symptoms subsided, however, she continued to complain of abdominal pain. 9/26 when the patient's daughter got home from work the patient was found to be unresponsive. EMS was called and she was found hypoglycemic which was corrected, however, altered mental status did not improve. Upon arrival to the emergency department she continued to be lethargic and was found to be hypotensive with hypoxemia. Laboratory evaluation significant for potassium 7.4, bicarbonate less than 7, creatinine 6.51, BUN 95, WBC 12.3, lactic acid 13.88, pH 7.07.  SUBJECTIVE:  Remains intubated. More awake yesterday and today. Opens eyes to name and followed command of squeezing hand today.   VITAL SIGNS: BP 133/63   Pulse 83   Temp 98.5 F (36.9 C) (Oral)   Resp (!) 21   Ht '5\' 6"'$  (1.676 m)   Wt 240 lb (108.9 kg)   SpO2 100%   BMI 38.74 kg/m   HEMODYNAMICS:    VENTILATOR SETTINGS: Vent Mode: PRVC FiO2 (%):  [40 %] 40 % Set Rate:  [20 bmp] 20 bmp Vt Set:  [470 mL] 470 mL PEEP:  [5 cmH20] 5 cmH20 Pressure Support:  [5 cmH20-8 cmH20] 8 cmH20 Plateau Pressure:  [18 cmH20] 18  cmH20  INTAKE / OUTPUT: I/O last 3 completed shifts: In: 6222 [I.V.:360; NG/GT:1073; IV Piggyback:50] Out: 39 [Urine:10; Other:600]  PHYSICAL EXAMINATION: General:  Obese female. Chronically ill appearing. Neuro: Opens eyes to voice and squeezed hand when asked  HEENT:  No scleral icterus or injection. ETT in place. Tacky mucous membranes with dried blood on lips. Cardiovascular:  Regular rhythm and rate. 1+ LE bilaterally to knee. Unable to appreciate JVD. Lungs:  Coarse bilaterally.  Symmetric chest rise on ventilator.  Abdomen:  Soft. Protuberant. Hypoactive BS. Patient grimaces with deep palpation.  Musculoskeletal:  No joint deformity or effusion. No crepitus. Skin:  Warm & dry. No rash on exposed skin.    LABS:  BMET  Recent Labs Lab 06/28/16 0355 06/29/16 0400 06/30/16 0450  NA 138 137 137  K 4.5 4.8 4.1  CL 101 101 97*  CO2 '27 25 26  '$ BUN 27* 48* 34*  CREATININE 1.97* 3.17* 2.48*  GLUCOSE 152* 182* 135*    Electrolytes  Recent Labs Lab 06/28/16 0355 06/29/16 0400 06/30/16 0450 06/30/16 0455  CALCIUM 8.3* 8.3* 8.7*  --   MG 2.5* 2.5*  --  2.2  PHOS 3.1 4.1 3.4  --     CBC  Recent Labs Lab 06/28/16 0355 06/29/16 0400 06/30/16 0455  WBC 29.6* 28.9* 25.3*  HGB 7.9* 7.4* 6.8*  HCT 24.6* 22.6* 20.6*  PLT 121* 96* 104*    Coag's  Recent Labs Lab 06/25/16 0400 06/25/16 0650 06/26/16 0413  06/27/16 0420 06/29/16 0914  APTT 147*  --  175* 151*  --   INR  --  1.76  --   --  1.38    Sepsis Markers  Recent Labs Lab 06/25/16 0415 06/25/16 1302 06/26/16 0413  LATICACIDVEN 9.2* 3.9* 1.8    ABG  Recent Labs Lab 06/24/16 0400 06/24/16 1605 06/25/16 0355  PHART 7.492* 7.486* 7.420  PCO2ART 22.8* 23.2* 31.4*  PO2ART 154* 170* 71.5*    Liver Enzymes  Recent Labs Lab 06/25/16 0650  06/28/16 1831 06/29/16 0400 06/30/16 0450 06/30/16 0455  AST <5*  --  2,157*  --   --  1,412*  ALT 91*  --  591*  --   --  385*  ALKPHOS 300*  --   639*  --   --  452*  BILITOT 1.3*  --  0.9  --   --  1.2  ALBUMIN 1.9*  < > 1.5* 1.5* 2.1* 2.1*  < > = values in this interval not displayed.  Cardiac Enzymes No results for input(s): TROPONINI, PROBNP in the last 168 hours.  Glucose  Recent Labs Lab 06/29/16 0743 06/29/16 1138 06/29/16 1544 06/29/16 1941 06/29/16 2337 06/30/16 0333  GLUCAP 164* 146* 124* 102* 102* 121*    Imaging US Abdomen Limited Ruq  Result Date: 06/29/2016 CLINICAL DATA:  Transaminitis, hypertension, diabetes mellitus, chronic kidney disease EXAM: US ABDOMEN LIMITED - RIGHT UPPER QUADRANT COMPARISON:  CT abdomen and pelvis 06/21/2016 FINDINGS: Gallbladder: Normally distended without stones or wall thickening. No pericholecystic fluid. Unable to assess for sonographic Murphy sign, patient on ventilator. Common bile duct: Diameter: 4 mm diameter , normal Liver: Normal appearance No RIGHT upper quadrant free fluid. IMPRESSION: No acute abnormalities. Electronically Signed   By: Lavonia Dana M.D.   On: 06/29/2016 19:14    STUDIES:  CT Head 9/27:  No acute abnormality. Chronic small vessel ischemic changes. Chronic lacunar infarct left basal ganglia & chronic ischemia right side of pons.  CT Abd/Pelvis w/o 9/27:  Early partial SBO w/ transition point LLQ with adhesions. Left humerus soft tissue swelling & intramuscular gas. Punctate hepatic granulomas noted.  Left Arm X-ray 9/27:  Diffuse soft tissue edema.  TTE 9/27:  LV w/ EF 60-65%. Grade 1 Diastolic dysfunction. No regional wall motion abnormalities. LA & RA normal in size. RV normal in size & function. Mild AS w/o AR. Mild MR w/o MS.  ABIs 10/3: R normal. L posterior artery noncompressible; suspect calcification.  CT head 10/3: No acute infarct, no hemorrhage or mass. Atrophy evident. Similar to prior study.   MICROBIOLOGY: MRSA PCR 9/27:  Negative Blood Ctx x 2 9/26 >> Coag Neg Staph 1/2 Urine Ctx 9/26:  5000 CFU E coli & 1000 CFU Klebsiella    ANTIBIOTICS: Levaquin 9/26 - 9/28 Aztreonam 9/26 - 9/27 Vancomycin 9/26 >> Merrem 9/27 >> 10/3  SIGNIFICANT EVENTS: 9/26 - admit for presumed septic shock  LINES/TUBES: OETT 7.5 9/26 >> L IJ CVL 9/26 >> Foley 9/26 >> RIJ HD 9/30 >>  DISCUSSION: Blenda Wisecup is a 65-y/o female with T2DM, CKD, recurrent UTIs 2/2 incontinence, and stroke with subsequent severe debilitation (ECOG 4), who presented with AMS and found to be in shock, likely 2/2 UTI sepsis. Some improvement in level of alertness with CRRT. Completed 7-day course of meropenem 10/3. Has not been making urine after CRRT so received iHD yesterday. Making slow improvements in level of alertness.   ASSESSMENT / PLAN:  PULMONARY A: Acute Hypoxic Respiratory Failure -  Unable to protect airway with acute encephalopathy.  OHS P:   SBT as tolerates, more alert today Restart oral care for PNA prophylaxis  CXR intermittently.  CARDIOVASCULAR A:  Shock - Likely sepsis. Cortisol 45.1. Resolved. H/O HTN & HLD P:  Monitor hemodynamics.  RENAL A:   Acute On Chronic Renal Failure Stage IV   Profound Metabolic Acidosis  Hyperkalemia - Resolved.  Lactic Acidosis, suspect due to hypoperfusion and also to metformin, persisted likely due to poor renal clearance.  Resolved 10/2 after CRRT. Hyperphosphatemia - Secondary to acute renal failure. Resolved. Pseudohypocalcemia - Improving.  P:   Monitoring UOP with Foley, minimal. Lasix given by Renal 10/3 without improved UOP.  CRRT ended 10/3, following renal fxn; iHD 10/5. Concerned that she will not be a good candidate for long term HD. ? Need to initiate palliative discussions.   GASTROINTESTINAL A:   Partial SBO - resolving on XR abd 9/30. H/O Ventral Hernia Repair. Possible Liver Cirrhosis. RUQ U/S normal. Ammonia WNL 10/5.  Transaminitis improving from 10/4. AST 1412 < 2157 < 5 ; ALT  591 < 91 P:   Continue trickle feeds. Pepcid IV daily.  CMP in  AM  HEMATOLOGIC A:   Anemia - Chronic. No signs of active bleeding. Hgb downtrending 6.8 < 7.9 < 8.7. Leukocytosis - Likely due to sepsis. Improving slowly. P:  SCD's / aspirin. Plavix held Heparin held for thrombocytopenia and HIT ab pending S/p 1 u transfusion  CBC in PM. Transfusion goal of 7.0.   INFECTIOUS A:   Severe Sepsis - due to E.coli and Klebsiella UTI. Unlikely Coag Neg Staph Bacteremia, suspect contaminant P:   Completed course of meropenem Following cultures to completion. Holding home Trimethoprim.  ENDOCRINE A:   Hypoglycemia - Resolved with Dextrose IVF. H/O DM Type 2 - A1c 5.8. H/O Hyperthyroidism - On PTU at home. TSH 0.331 & F T4 1.33 9/26. Repeat TSH 2.044 10/3.  P:   SSI per Sensitive Algorithm. Holding PTU & Metformin. Recheck TSH next week  NEUROLOGIC A:   Acute Encephalopathy - Question hepatic vs toxic metabolic vs hypoxia. No improvement in Lactulose Enema despite improved Ammonia.  Favor toxic metabolic as is gradually improving with CRRT. Repeat CT head unchanged. TSH WNL.  H/O CVA - Minimal mobility. P:   RASS goal: 0 to -1 Avoid sedating meds. Folate in process.   MUSCULOSKELETAL A: Edema Left Arm - Improved. Secondary to IV infiltration. No gas on X-ray 9/27. P: Monitor closely - no crepitus   FAMILY  - Updates: None available 10/6. Believe that we need to initiate palliative care discussions given her debilitation at baseline and the effects of this illness on overall prognosis  - Inter-disciplinary family meet or Palliative Care meeting due by: Spoke with daughter on phone 10/5.   Olene Floss, MD [facility], PGY-2   Attending Note:  I have examined patient, reviewed labs, studies and notes. I have discussed the case with Dr Ola Spurr, and I agree with the data and plans as amended above. 65 yo woman, hx CVA, dependent for most of her care, admitted with severe lactic acidosis possibly due to metformin  + septic shock. Source unclear - ? UTI or cholecystitis. Course c/b severe encephalopathy and acute respiratory failure. On eval she continues to slowly be more awake, opens eyes and now tries to follow commands. Tolerates PSV. Her UOP has not picked up yet post discontinuation HD. Concerned that she is a poor long-term HD candidate. Hopefully  MS will clear as metabolic staus improves. HITT panel pending.  ? Thrombocytopenia and anemia due to sepsis. Independent critical care time is 34 minutes.   Baltazar Apo, MD, PhD 06/30/2016, 12:07 PM [facility] [phone] or if no answer [phone]

## 2016-06-30 NOTE — Progress Notes (Signed)
Pts daughter called requesting a call from the nephrology MD. Dr Marval Regal paged with appropriate contact information and agreed to call [name] (Pt's daughter) with an update.

## 2016-07-01 DIAGNOSIS — R109 Unspecified abdominal pain: Secondary | ICD-10-CM

## 2016-07-01 DIAGNOSIS — Z515 Encounter for palliative care: Secondary | ICD-10-CM

## 2016-07-01 LAB — CBC WITH DIFFERENTIAL/PLATELET
BASOS ABS: 0 10*3/uL (ref 0.0–0.1)
Basophils Relative: 0 %
Eosinophils Absolute: 0.2 10*3/uL (ref 0.0–0.7)
Eosinophils Relative: 1 %
HCT: 23.7 % — ABNORMAL LOW (ref 36.0–46.0)
HEMOGLOBIN: 7.7 g/dL — AB (ref 12.0–15.0)
LYMPHS ABS: 1 10*3/uL (ref 0.7–4.0)
LYMPHS PCT: 5 %
MCH: 27.3 pg (ref 26.0–34.0)
MCHC: 32.5 g/dL (ref 30.0–36.0)
MCV: 84 fL (ref 78.0–100.0)
Monocytes Absolute: 2.6 10*3/uL — ABNORMAL HIGH (ref 0.1–1.0)
Monocytes Relative: 13 %
NEUTROS ABS: 15.9 10*3/uL — AB (ref 1.7–7.7)
NEUTROS PCT: 81 %
Platelets: 121 10*3/uL — ABNORMAL LOW (ref 150–400)
RBC: 2.82 MIL/uL — AB (ref 3.87–5.11)
RDW: 15 % (ref 11.5–15.5)
WBC: 19.7 10*3/uL — AB (ref 4.0–10.5)

## 2016-07-01 LAB — RENAL FUNCTION PANEL
ANION GAP: 11 (ref 5–15)
Albumin: 1.8 g/dL — ABNORMAL LOW (ref 3.5–5.0)
BUN: 63 mg/dL — ABNORMAL HIGH (ref 6–20)
CALCIUM: 8.6 mg/dL — AB (ref 8.9–10.3)
CHLORIDE: 99 mmol/L — AB (ref 101–111)
CO2: 26 mmol/L (ref 22–32)
CREATININE: 3.38 mg/dL — AB (ref 0.44–1.00)
GFR, EST AFRICAN AMERICAN: 16 mL/min — AB (ref >60)
GFR, EST NON AFRICAN AMERICAN: 13 mL/min — AB (ref >60)
Glucose, Bld: 166 mg/dL — ABNORMAL HIGH (ref 65–99)
Phosphorus: 4.1 mg/dL (ref 2.5–4.6)
Potassium: 4.5 mmol/L (ref 3.5–5.1)
SODIUM: 136 mmol/L (ref 135–145)

## 2016-07-01 LAB — GLUCOSE, CAPILLARY
GLUCOSE-CAPILLARY: 159 mg/dL — AB (ref 65–99)
GLUCOSE-CAPILLARY: 130 mg/dL — AB (ref 65–99)
GLUCOSE-CAPILLARY: 185 mg/dL — AB (ref 65–99)
Glucose-Capillary: 151 mg/dL — ABNORMAL HIGH (ref 65–99)
Glucose-Capillary: 163 mg/dL — ABNORMAL HIGH (ref 65–99)

## 2016-07-01 LAB — HEPATIC FUNCTION PANEL
ALBUMIN: 1.9 g/dL — AB (ref 3.5–5.0)
ALK PHOS: 594 U/L — AB (ref 38–126)
ALT: 553 U/L — ABNORMAL HIGH (ref 14–54)
AST: 2162 U/L — ABNORMAL HIGH (ref 15–41)
BILIRUBIN DIRECT: 0.4 mg/dL (ref 0.1–0.5)
BILIRUBIN TOTAL: 1 mg/dL (ref 0.3–1.2)
Indirect Bilirubin: 0.6 mg/dL (ref 0.3–0.9)
Total Protein: 5.2 g/dL — ABNORMAL LOW (ref 6.5–8.1)

## 2016-07-01 LAB — MAGNESIUM: Magnesium: 2.3 mg/dL (ref 1.7–2.4)

## 2016-07-01 LAB — TYPE AND SCREEN
ABO/RH(D): O POS
ANTIBODY SCREEN: NEGATIVE
UNIT DIVISION: 0

## 2016-07-01 MED ORDER — ALBUMIN HUMAN 25 % IV SOLN
INTRAVENOUS | Status: AC
  Filled 2016-07-01: qty 100

## 2016-07-01 MED ORDER — SODIUM CHLORIDE 0.9 % IV SOLN: 100.0000 mL | INTRAVENOUS | Status: DC | PRN

## 2016-07-01 NOTE — Consult Note (Signed)
Consultation Note Date: 07/01/2016   Patient Name: Angelica Beck  DOB: 21-Feb-1951  MRN: [id number]  Age / Sex: 65 y.o., female  PCP: Delia Chimes, NP Referring Physician: Brand Males, MD  Reason for Consultation: Establishing goals of care and Psychosocial/spiritual support  HPI/Patient Profile: 65 y.o. female  with past medical history of Stroke, diabetes, chronic kidney disease, chronic urinary tract infections secondary to incontinence and debility, admitted on 06/20/2016 after experiencing 2 days of nausea and vomiting at home, abdominal pain. When her daughter returned home from work she found her unresponsive. Upon arrival to the emergency department she was lethargic, hypotensive with hypoxemia. Her potassium was 7.4 creatinine 6.51 lactic acid 13.88.Marland Kitchen CT of the abdomen showed a partial small bowel obstruction. She was intubated and has been on  CRRT. Her liver enzymes were also elevated with her alkaline phosphatase at 594, AST/ALT 2162/553; albumin 1.8. She has remained critically ill in ICU since admission but is slowly looking more alert. Her daughter, with whom she has lived for the past several years and who is her caregiver sees her as much improved.  Clinical Assessment and Goals of Care: Patient's daughter is her primary caregiver and provides most of assessment and history here today as patient is still intubated. Patient is more alert per daughter is now squeezing her hand per request and is breathing above the vent.  Daughter, [name]. Pt is unmarried and no other children    SUMMARY OF RECOMMENDATIONS   Full scope of treatment including hemodialysis Patient's daughter reports in the past she received education on how to perform dialysis, including hemodialysis in the home and is willing to do this if her renal function does not improve She verbalizes that she feels even though her  mother had a stroke that she had quality of life living with her and her family. She describes her baseline function as someone that could hold her position sitting on the side of the bed could stand for brief periods of time unassisted, could pivot it to a wheelchair or a recliner chair. She was also able to feed herself. They would take short trips to [facility] and to nail salons as well. Daughter recognizes that this is been a major change in her health and may not be able to return to baseline Overall her daughter wants to pursue everything and full aggressive measures but she was able to articulate a place where she would not view her mother as having quality of life and that is if she had complete inability to communicate with her on any level and if her level of care was such that she required a nursing home. If patient becomes completely bedbound and immobile, unable to assist with turning standing pivoting for example daughter would struggle with admitting her mother to a nursing home. She states she promised her she would never do this and feels that she would not receive good care there. But having said this she is very motivated to provide all ADLs as well as learn home  hemodialysis if necessary.  Code Status/Advance Care Planning:  Full code    Symptom Management:   Pain: Continue with as needed medicines as prescribed by critical care medicine  Palliative Prophylaxis:   Aspiration, Delirium Protocol, Eye Care, Frequent Pain Assessment, Oral Care and Turn Reposition  Additional Recommendations (Limitations, Scope, Preferences):  Full Scope Treatment  Psycho-social/Spiritual:   Desire for further Chaplaincy support:no   Prognosis:   Unable to determine  Discharge Planning: To Be Determined      Primary Diagnoses: Present on Admission: . Septic shock ([place]) . Acute encephalopathy . AKI (acute kidney injury) ([place]) . Lactic acidosis   I have reviewed the medical  record, interviewed the patient and family, and examined the patient. The following aspects are pertinent.  Past Medical History:  Diagnosis Date  . Arthritis   . Diabetes mellitus, type 2 ([place])   . Hyperlipidemia   . Hypertension   . Hyperthyroidism   . Obesity hypoventilation syndrome ([place]) 06/20/2013  . Stroke [facility])    Social History   Social History  . Marital status: Single    Spouse name: N/A  . Number of children: N/A  . Years of education: N/A   Social History Main Topics  . Smoking status: Former Smoker    Packs/day: 0.25    Years: 30.00    Types: Cigarettes    Quit date: 09/25/2002  . Smokeless tobacco: Never Used  . Alcohol use No  . Drug use: No  . Sexual activity: Not Asked   Other Topics Concern  . None   Social History Narrative  . None   Family History  Problem Relation Age of Onset  . Diabetes Mother    Scheduled Meds: . albumin human      . aspirin  150 mg Rectal Daily  . darbepoetin (ARANESP) injection - DIALYSIS  100 mcg Intravenous Q Mon-HD  . famotidine (PEPCID) IV  20 mg Intravenous Q24H  . feeding supplement (VITAL HIGH PROTEIN)  1,000 mL Per Tube Q24H  . insulin aspart  0-9 Units Subcutaneous Q4H   Continuous Infusions:  PRN Meds:.sodium chloride, Place/Maintain arterial line **AND** sodium chloride, sodium chloride, sodium chloride, sodium chloride, sodium chloride, alteplase, fentaNYL (SUBLIMAZE) injection, lidocaine (PF), lidocaine-prilocaine, pentafluoroprop-tetrafluoroeth Medications Prior to Admission:  Prior to Admission medications   Medication Sig Start Date End Date Taking? Authorizing Provider  Cholecalciferol (VITAMIN D) 2000 UNITS tablet Take 2,000 Units by mouth daily.   Yes Historical Provider, MD  clopidogrel (PLAVIX) 75 MG tablet Take 1 tablet (75 mg total) by mouth daily. 08/24/15  Yes Daniel J Angiulli, PA-C  gabapentin (NEURONTIN) 100 MG capsule Take 1 capsule (100 mg total) by mouth at bedtime. 08/24/15  Yes Daniel J  Angiulli, PA-C  insulin glargine (LANTUS) 100 UNIT/ML injection 10 units at breakfast and 20 units every evening 08/24/15  Yes Daniel J Angiulli, PA-C  metFORMIN (GLUCOPHAGE) 1000 MG tablet Take 1,000 mg by mouth 2 (two) times daily with a meal.   Yes Historical Provider, MD  metoprolol succinate (TOPROL-XL) 50 MG 24 hr tablet Take 50 mg by mouth daily. Take with or immediately following a meal.   Yes Historical Provider, MD  Multiple Vitamin (MULTIVITAMIN WITH MINERALS) TABS tablet Take 1 tablet by mouth daily. 08/24/15  Yes Daniel J Angiulli, PA-C  niacin (NIASPAN) 1000 MG CR tablet Take 1 tablet (1,000 mg total) by mouth at bedtime. 08/24/15  Yes Daniel J Angiulli, PA-C  nitroGLYCERIN (NITROSTAT) 0.4 MG SL tablet Place 1 tablet (0.4  mg total) under the tongue every 5 (five) minutes as needed for chest pain. 01/21/14  Yes Belkys A Regalado, MD  OXYGEN Inhale 2 L into the lungs at bedtime.   Yes Historical Provider, MD  propylthiouracil (PTU) 50 MG tablet Take 1 tablet (50 mg total) by mouth 2 (two) times daily. 08/24/15  Yes Daniel J Angiulli, PA-C  ranitidine (ZANTAC) 150 MG tablet Take 150 mg by mouth 2 (two) times daily.  06/05/13  Yes Historical Provider, MD  rosuvastatin (CRESTOR) 40 MG tablet Take 1 tablet (40 mg total) by mouth at bedtime. 08/24/15  Yes Daniel J Angiulli, PA-C  traMADol (ULTRAM) 50 MG tablet Take 50 mg by mouth every 12 (twelve) hours as needed for severe pain.   Yes Historical Provider, MD  trimethoprim (TRIMPEX) 100 MG tablet Take 1 tablet by mouth daily. 06/13/16  Yes Historical Provider, MD  vitamin C (ASCORBIC ACID) 500 MG tablet Take 500 mg by mouth daily.   Yes Historical Provider, MD   Allergies  Allergen Reactions  . Lactose Intolerance (Gi) Diarrhea and Nausea And Vomiting  . Penicillins Swelling    Facial swelling   Review of Systems  Unable to perform ROS: Intubated    Physical Exam  Constitutional: She appears well-developed and well-nourished.  Seen in  icu. intubated  Pulmonary/Chest:  On ventilator  Neurological: She is alert.  Skin: Skin is warm.  Psychiatric:  Unable to test    Vital Signs: BP 109/63 (BP Location: Left Arm)   Pulse 81   Temp 98.6 F (37 C) (Oral)   Resp (!) 21   Ht '5\' 6"'$  (1.676 m)   Wt 112.2 kg (247 lb 5.7 oz)   SpO2 98%   BMI 39.92 kg/m  Pain Assessment: No/denies pain   Pain Score: 0-No pain   SpO2: SpO2: 98 % O2 Device:SpO2: 98 % O2 Flow Rate: .   IO: Intake/output summary:  Intake/Output Summary (Last 24 hours) at 07/01/16 1824 Last data filed at 07/01/16 1700  Gross per 24 hour  Intake             1380 ml  Output                0 ml  Net             1380 ml    LBM: Last BM Date: 06/30/16 Baseline Weight: Weight: 97.5 kg (215 lb) Most recent weight: Weight: 112.2 kg (247 lb 5.7 oz)     Palliative Assessment/Data:   Flowsheet Rows   Flowsheet Row Most Recent Value  Intake Tab  Referral Department  Critical care  Unit at Time of Referral  ICU  Palliative Care Primary Diagnosis  Sepsis/Infectious Disease  Date Notified  06/30/16  Palliative Care Type  New Palliative care  Reason for referral  Clarify Goals of Care  Date of Admission  06/20/16  Date first seen by Palliative Care  07/01/16  # of days Palliative referral response time  1 Day(s)  # of days IP prior to Palliative referral  10  Clinical Assessment  Palliative Performance Scale Score  20%  Pain Max last 24 hours  Not able to report  Pain Min Last 24 hours  Not able to report  Dyspnea Max Last 24 Hours  Not able to report  Dyspnea Min Last 24 hours  Not able to report  Nausea Max Last 24 Hours  Not able to report  Nausea Min Last 24 Hours  Not able  to report  Anxiety Max Last 24 Hours  Not able to report  Anxiety Min Last 24 Hours  Not able to report  Other Max Last 24 Hours  Not able to report  Psychosocial & Spiritual Assessment  Palliative Care Outcomes  Patient/Family meeting held?  Yes  Who was at the meeting?   daughter  Palliative Care Outcomes  Clarified goals of care  Palliative Care follow-up planned  No      Time In: 1700 Time Out: 1810 Time Total: 70 min Greater than 50%  of this time was spent counseling and coordinating care related to the above assessment and plan.  Signed by: Dory Horn, NP   Please contact Palliative Medicine Team phone at [phone] for questions and concerns.  For individual provider: See Shea Evans

## 2016-07-01 NOTE — Progress Notes (Signed)
Patient ID: Angelica Beck, female   DOB: 12-26-1950, 65 y.o.   MRN: [id number] S:intubated, unresponsive O:BP 120/68 (BP Location: Left Arm)   Pulse 72   Temp 98.4 F (36.9 C) (Oral)   Resp (!) 0   Ht '5\' 6"'$  (1.676 m)   Wt 108.5 kg (239 lb 3.2 oz)   SpO2 99%   BMI 38.61 kg/m   Intake/Output Summary (Last 24 hours) at 07/01/16 0817 Last data filed at 07/01/16 0600  Gross per 24 hour  Intake             1692 ml  Output                5 ml  Net             1687 ml   Intake/Output: I/O last 3 completed shifts: In: 2105 [I.V.:300; Blood:337; NG/GT:1418; IV Piggyback:50] Out: 615 [Urine:15; Other:600]  Intake/Output this shift:  No intake/output data recorded. Weight change: 0.4 kg (14.1 oz) RCB:ULAGT AAF intubated with blood on tongue CVS:no rub Resp:occ rhonchi Abd:+BS, soft Ext:tr edema   Recent Labs Lab 06/25/16 0650  06/26/16 0413 06/26/16 1600 06/27/16 0420 06/28/16 0355 06/28/16 1831 06/29/16 0400 06/30/16 0450 06/30/16 0455 07/01/16 0500  NA  --   < > 138 138 135 138  --  137 137  --  136  K  --   < > 4.4 4.3 4.1 4.5  --  4.8 4.1  --  4.5  CL  --   < > 102 103 102 101  --  101 97*  --  99*  CO2  --   < > '24 25 27 27  '$ --  25 26  --  26  GLUCOSE  --   < > 140* 145* 140* 152*  --  182* 135*  --  166*  BUN  --   < > 21* 14 12 27*  --  48* 34*  --  63*  CREATININE  --   < > 1.63* 1.37* 1.07* 1.97*  --  3.17* 2.48*  --  3.38*  ALBUMIN 1.9*  < > 1.9* 1.8* 1.9* 1.6* 1.5* 1.5* 2.1* 2.1* 1.8*  1.9*  CALCIUM  --   < > 7.8* 7.8* 8.0* 8.3*  --  8.3* 8.7*  --  8.6*  PHOS  --   < > 2.1* 1.5* 1.4* 3.1  --  4.1 3.4  --  4.1  AST <5*  --   --   --   --   --  2,157*  --   --  1,412* 2,162*  ALT 91*  --   --   --   --   --  591*  --   --  385* 553*  < > = values in this interval not displayed. Liver Function Tests:  Recent Labs Lab 06/28/16 1831  06/30/16 0450 06/30/16 0455 07/01/16 0500  AST 2,157*  --   --  1,412* 2,162*  ALT 591*  --   --  385* 553*  ALKPHOS 639*   --   --  452* 594*  BILITOT 0.9  --   --  1.2 1.0  PROT 5.3*  --   --  5.5* 5.2*  ALBUMIN 1.5*  < > 2.1* 2.1* 1.8*  1.9*  < > = values in this interval not displayed. No results for input(s): LIPASE, AMYLASE in the last 168 hours.  Recent Labs Lab 06/29/16 1130  AMMONIA 21   CBC:  Recent  Labs Lab 06/27/16 0420  06/28/16 0355 06/29/16 0400 06/30/16 0455 06/30/16 1600 07/01/16 0500  WBC 30.6*  --  29.6* 28.9* 25.3*  --  19.7*  NEUTROABS 26.6*  --  24.8* 23.2* 20.2*  --  15.9*  HGB 8.7*  --  7.9* 7.4* 6.8* 7.9* 7.7*  HCT 27.5*  < > 24.6* 22.6* 20.6* 23.8* 23.7*  MCV 85.1  --  83.7 83.4 83.1  --  84.0  PLT 155  --  121* 96* 104*  --  121*  < > = values in this interval not displayed. Cardiac Enzymes: No results for input(s): CKTOTAL, CKMB, CKMBINDEX, TROPONINI in the last 168 hours. CBG:  Recent Labs Lab 06/30/16 1524 06/30/16 1935 06/30/16 2348 07/01/16 0335 07/01/16 0812  GLUCAP 181* 182* 180* 159* 151*    Iron Studies: No results for input(s): IRON, TIBC, TRANSFERRIN, FERRITIN in the last 72 hours. Studies/Results: US Abdomen Limited Ruq  Result Date: 06/29/2016 CLINICAL DATA:  Transaminitis, hypertension, diabetes mellitus, chronic kidney disease EXAM: US ABDOMEN LIMITED - RIGHT UPPER QUADRANT COMPARISON:  CT abdomen and pelvis 06/21/2016 FINDINGS: Gallbladder: Normally distended without stones or wall thickening. No pericholecystic fluid. Unable to assess for sonographic Murphy sign, patient on ventilator. Common bile duct: Diameter: 4 mm diameter , normal Liver: Normal appearance No RIGHT upper quadrant free fluid. IMPRESSION: No acute abnormalities. Electronically Signed   By: Lavonia Dana M.D.   On: 06/29/2016 19:14   . aspirin  150 mg Rectal Daily  . darbepoetin (ARANESP) injection - DIALYSIS  100 mcg Intravenous Q Mon-HD  . famotidine (PEPCID) IV  20 mg Intravenous Q24H  . feeding supplement (VITAL HIGH PROTEIN)  1,000 mL Per Tube Q24H  . insulin aspart   0-9 Units Subcutaneous Q4H    BMET    Component Value Date/Time   NA 136 07/01/2016 0500   K 4.5 07/01/2016 0500   CL 99 (L) 07/01/2016 0500   CO2 26 07/01/2016 0500   GLUCOSE 166 (H) 07/01/2016 0500   BUN 63 (H) 07/01/2016 0500   CREATININE 3.38 (H) 07/01/2016 0500   CALCIUM 8.6 (L) 07/01/2016 0500   GFRNONAA 13 (L) 07/01/2016 0500   GFRAA 16 (L) 07/01/2016 0500   CBC    Component Value Date/Time   WBC 19.7 (H) 07/01/2016 0500   RBC 2.82 (L) 07/01/2016 0500   HGB 7.7 (L) 07/01/2016 0500   HCT 23.7 (L) 07/01/2016 0500   HCT 26.8 (L) 06/27/2016 1536   PLT 121 (L) 07/01/2016 0500   MCV 84.0 07/01/2016 0500   MCH 27.3 07/01/2016 0500   MCHC 32.5 07/01/2016 0500   RDW 15.0 07/01/2016 0500   LYMPHSABS 1.0 07/01/2016 0500   MONOABS 2.6 (H) 07/01/2016 0500   EOSABS 0.2 07/01/2016 0500   BASOSABS 0.0 07/01/2016 0500    Assessment/Plan:  1. AKI/CKD in setting of shock/sepsis.  Initiated on CRRT 9/30-10/3/17.  Remains oliguric and transitioned to IHD.  Pt is a poor candidate for longterm HD due to advanced dementia and poor functional status.  Discussed condition with her daughter who is not realistic about the prognosis and sees no reason to stop HD as she believes her mother is better with HD.  Plan for HD today but will have to have HD catheter replaced in the next few days.   2. Shock/sepsis- per PCCM.   3. VDRF- per PCCM 4. Anemia- on ESA.  Transfuse prn.   5. Dementia- per primary nephrologist, Dr. Lorrene Reid it is advanced at baseline 6. Disposition-  overall prognosis is poor.  She has a poor functional and cognitive status at baseline and is not a longterm candidate for HD.  Agree with Palliative care, however daughter has unrealistic expectations on mother's recovery/prognosis.  Continue to encourage and educate.  May need to involve ethics if Palliative care cannot make headway with daughter and [name] does not make any clinical improvements.  [place]

## 2016-07-01 NOTE — Progress Notes (Addendum)
PULMONARY / CRITICAL CARE MEDICINE   Name: Angelica Beck MRN: [id number] DOB: 06-Oct-1950    ADMISSION DATE:  06/20/2016 CONSULTATION DATE:  06/20/2016  REFERRING MD:  EDP Dr Cathleen Fears   CHIEF COMPLAINT:  AMS  HISTORY OF PRESENT ILLNESS:   65 year old female with past medical history as below, which is significant for stroke with subsequent debilitation, diabetes, chronic kidney disease, chronic urinary tract infections due to incontinence, for which she takes chronic antibiotics. She lives at home with her daughter where she requires full assistance she is unable to truly ambulate and spends most of her time in a hospital bed/recliner. She relies on family members almost entirely for activities of daily living. She was in her usual state of health until about 9/23 when she developed nausea and vomiting. This persisted for 2 days until symptoms subsided, however, she continued to complain of abdominal pain. 9/26 when the patient's daughter got home from work the patient was found to be unresponsive. EMS was called and she was found hypoglycemic which was corrected, however, altered mental status did not improve. Upon arrival to the emergency department she continued to be lethargic and was found to be hypotensive with hypoxemia. Laboratory evaluation significant for potassium 7.4, bicarbonate less than 7, creatinine 6.51, BUN 95, WBC 12.3, lactic acid 13.88, pH 7.07.  SUBJECTIVE:  Remains intubated. Continues to be more awake. Opens eyes to name and questions. Able to squeeze with hands and wiggle toes. Rectal tube placed for frequent loose BMs. Sacral skin sloughing noted by nursing.   VITAL SIGNS: BP 120/68 (BP Location: Left Arm)   Pulse 72   Temp 98.7 F (37.1 C) (Oral)   Resp (!) 0   Ht '5\' 6"'$  (1.676 m)   Wt 108.5 kg (239 lb 3.2 oz)   SpO2 99%   BMI 38.61 kg/m   HEMODYNAMICS:    VENTILATOR SETTINGS: Vent Mode: PRVC FiO2 (%):  [40 %] 40 % Set Rate:  [20 bmp] 20 bmp Vt Set:   [470 mL] 470 mL PEEP:  [5 cmH20] 5 cmH20 Pressure Support:  [8 cmH20] 8 cmH20 Plateau Pressure:  [16 cmH20-17 cmH20] 16 cmH20  INTAKE / OUTPUT: I/O last 3 completed shifts: In: 2105 [I.V.:300; Blood:337; NG/GT:1418; IV Piggyback:50] Out: 62 [Urine:15; Other:600]  PHYSICAL EXAMINATION: General:  Obese female. Chronically ill appearing. Neuro: Opens eyes to voice, tracked, squeezed hand and wiggles toes but with minimal strength HEENT:  No scleral icterus or injection. ETT in place. Tacky mucous membranes with dried blood on lips. Cardiovascular:  Regular rhythm and rate. 2+ LE bilaterally to knee and puffy hands and feet. Unable to appreciate JVD. Lungs:  Coarse bilaterally.  Symmetric chest rise on ventilator.  Abdomen:  Soft. Protuberant. + BS. Patient grimaces with deep palpation and has bandages on lower abdomen for skin breakdown.  Musculoskeletal:  No joint deformity or effusion. No crepitus. Skin:  Warm & dry. Sacral sloughing.   LABS:  BMET  Recent Labs Lab 06/29/16 0400 06/30/16 0450 07/01/16 0500  NA 137 137 136  K 4.8 4.1 4.5  CL 101 97* 99*  CO2 '25 26 26  '$ BUN 48* 34* 63*  CREATININE 3.17* 2.48* 3.38*  GLUCOSE 182* 135* 166*    Electrolytes  Recent Labs Lab 06/29/16 0400 06/30/16 0450 06/30/16 0455 07/01/16 0500  CALCIUM 8.3* 8.7*  --  8.6*  MG 2.5*  --  2.2 2.3  PHOS 4.1 3.4  --  4.1    CBC  Recent Labs Lab 06/29/16 0400 06/30/16  0455 06/30/16 1600 07/01/16 0500  WBC 28.9* 25.3*  --  19.7*  HGB 7.4* 6.8* 7.9* 7.7*  HCT 22.6* 20.6* 23.8* 23.7*  PLT 96* 104*  --  121*    Coag's  Recent Labs Lab 06/25/16 0400 06/25/16 0650 06/26/16 0413 06/27/16 0420 06/29/16 0914  APTT 147*  --  175* 151*  --   INR  --  1.76  --   --  1.38    Sepsis Markers  Recent Labs Lab 06/25/16 0415 06/25/16 1302 06/26/16 0413  LATICACIDVEN 9.2* 3.9* 1.8    ABG  Recent Labs Lab 06/24/16 1605 06/25/16 0355  PHART 7.486* 7.420  PCO2ART 23.2*  31.4*  PO2ART 170* 71.5*    Liver Enzymes  Recent Labs Lab 06/28/16 1831  06/30/16 0450 06/30/16 0455 07/01/16 0500  AST 2,157*  --   --  1,412* 2,162*  ALT 591*  --   --  385* 553*  ALKPHOS 639*  --   --  452* 594*  BILITOT 0.9  --   --  1.2 1.0  ALBUMIN 1.5*  < > 2.1* 2.1* 1.8*  1.9*  < > = values in this interval not displayed.  Cardiac Enzymes No results for input(s): TROPONINI, PROBNP in the last 168 hours.  Glucose  Recent Labs Lab 06/30/16 0748 06/30/16 1135 06/30/16 1524 06/30/16 1935 06/30/16 2348 07/01/16 0335  GLUCAP 146* 161* 181* 182* 180* 159*    Imaging No results found.  STUDIES:  CT Head 9/27:  No acute abnormality. Chronic small vessel ischemic changes. Chronic lacunar infarct left basal ganglia & chronic ischemia right side of pons.  CT Abd/Pelvis w/o 9/27:  Early partial SBO w/ transition point LLQ with adhesions. Left humerus soft tissue swelling & intramuscular gas. Punctate hepatic granulomas noted.  Left Arm X-ray 9/27:  Diffuse soft tissue edema.  TTE 9/27:  LV w/ EF 60-65%. Grade 1 Diastolic dysfunction. No regional wall motion abnormalities. LA & RA normal in size. RV normal in size & function. Mild AS w/o AR. Mild MR w/o MS.  ABIs 10/3: R normal. L posterior artery noncompressible; suspect calcification.  CT head 10/3: No acute infarct, no hemorrhage or mass. Atrophy evident. Similar to prior study.   MICROBIOLOGY: MRSA PCR 9/27:  Negative Blood Ctx x 2 9/26 >> Coag Neg Staph 1/2 Urine Ctx 9/26:  5000 CFU E coli & 1000 CFU Klebsiella   ANTIBIOTICS: Levaquin 9/26 - 9/28 Aztreonam 9/26 - 9/27 Vancomycin 9/26 >> Merrem 9/27 >> 10/3  SIGNIFICANT EVENTS: 9/26 - admit for presumed septic shock  LINES/TUBES: OETT 7.5 9/26 >> L IJ CVL 9/26 >> Foley 9/26 >> RIJ HD 9/30 >> Rectal tube 10/6 >>  DISCUSSION: Angelica Beck is a 65-y/o female with T2DM, CKD, recurrent UTIs 2/2 incontinence, and stroke with subsequent severe  debilitation (Left sided weakness; ECOG 4), who presented with AMS and found to be in shock, likely 2/2 UTI sepsis. Some improvement in level of alertness with CRRT. Completed 7-day course of meropenem 10/3. Has not been making urine after CRRT so received iHD 10/5. Making slow improvements in level of alertness but unclear that she can successfully extubate.  Concern remains for ability to regain former function given reliance on hemodialysis and continued severe weakness. Suspect that we will need to consider extubation when we believe she is optimized, acknowledge that if she fails we would make her comfortable. scenario complicated by the fact that hepatic and renal injuries have not resolved either. Palliative care discussions needed here.  ASSESSMENT / PLAN:  PULMONARY A: Acute Hypoxic Respiratory Failure - Unable to protect airway with acute encephalopathy.  OHS P:   SBT as tolerates, more alert today Restart oral care for PNA prophylaxis  CXR intermittently.  CARDIOVASCULAR A:  Shock - Likely sepsis. Cortisol 45.1. Resolved. H/O HTN & HLD P:  Monitor hemodynamics.  RENAL A:   Acute On Chronic Renal Failure Stage IV  - worsens without HD treatments Profound Metabolic Acidosis  Hyperkalemia - Resolved.  Lactic Acidosis, suspect due to hypoperfusion with possible contribution of renal injury 2/2 metformin, persisting likely due to poor renal clearance.  Resolved 10/2 after CRRT. Hyperphosphatemia - Secondary to acute renal failure. Resolved. Pseudohypocalcemia - Improving.  P:   Monitoring UOP with Foley, minimal. Lasix given by Renal 10/3 without improved UOP.  CRRT ended 10/3, following renal fxn; iHD 10/5. She will not be a good candidate for long term HD. Have consulted Palliative Care with daughter's permission.   GASTROINTESTINAL A:   Partial SBO - Resolving on XR abd 9/30. Having BMs. H/O Ventral Hernia Repair. Possible Liver Cirrhosis. RUQ U/S normal. Ammonia WNL  10/5.  Transaminitis increasing, suspect shock liver from poor renal clearance.  P:   Continue tube feeds. Pepcid IV daily.  CMP in AM  HEMATOLOGIC A:   Anemia - Chronic. Oozing wounds around mouth. Hgb stable after transfusion Leukocytosis - Likely due to sepsis. Improving slowly. P:  SCD's / aspirin. 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   INFECTIOUS A:   Severe Sepsis - due to E.coli and Klebsiella UTI. Unlikely Coag Neg Staph Bacteremia, suspect contaminant Leukocytosis improving Sacral wound P:   Completed course of meropenem Following cultures to completion. Holding home Trimethoprim. Wound care consult called  ENDOCRINE A:   Hypoglycemia - Resolved with Dextrose IVF. H/O DM Type 2 - A1c 5.8. H/O Hyperthyroidism - On PTU at home. TSH 0.331 & F T4 1.33 9/26. Repeat TSH 2.044 10/3.  P:   SSI per Sensitive Algorithm. Holding PTU & Metformin. Recheck TSH next week  NEUROLOGIC A:   Acute Encephalopathy - Question hepatic vs toxic metabolic vs hypoxia. No improvement in Lactulose Enema despite improved Ammonia.  Favor toxic metabolic as is gradually improving with CRRT. Repeat CT head unchanged. TSH WNL. Folate WNL. H/O CVA - Minimal mobility. P:   RASS goal: 0 to -1 Avoid sedating meds.  MUSCULOSKELETAL A: Edema Left Arm - Improved. Secondary to IV infiltration. No gas on X-ray 9/27. P: Continue to monitor   FAMILY  - Updates: None available 10/7. Daughter preferring as aggressive treatment as possible at this time, given small daily improvements.   - Inter-disciplinary family meet or Palliative Care meeting due by: Continued [place] discussion 10/6 with daughter [name].   Olene Floss, MD [facility], PGY-2  Attending Note:  I have examined patient, reviewed labs, studies and notes. I have discussed the case with dr Ola Spurr, and I agree  with the data and plans as amended above. 65 yo woman, hx CVA, dependent for most of her care, admitted with severe lactic acidosis possibly due to metformin + septic shock. Source unclear - ? UTI or cholecystitis. Course c/b severe encephalopathy and acute respiratory failure, acute renal failure, acute hepatic injury. On eval she continues to slowly be moreawake, opens eyes and now follows commands. Tolerates PSV. All the same unclear that she will improve to point of  successful extubation. I do not believe trach makes sense given her other organ injuries, low likelihood of return to her previous fxn capacity. Further, her renal failure has not recovered and she is a poor candidate for long-term HD. Agree with Palliative Care discussions. I will address with pt's daughter, appreciate Dr Elissa Hefty discussion with her as well. HITT Ab positive, confirmatory test pending. Independent critical care time is 33 minutes.   Baltazar Apo, MD, PhD 07/01/2016, 12:12 PM [facility] [phone] or if no answer [phone]

## 2016-07-01 NOTE — Progress Notes (Signed)
Continued diarrhea, multiple broken skin and location on sacrum; discussed with Dr Royston Cowper: hold off on c-diff for now: rectal tube inserted

## 2016-07-02 LAB — CBC WITH DIFFERENTIAL/PLATELET
Basophils Absolute: 0 10*3/uL (ref 0.0–0.1)
Basophils Relative: 0 %
EOS ABS: 0.1 10*3/uL (ref 0.0–0.7)
EOS PCT: 1 %
HCT: 23 % — ABNORMAL LOW (ref 36.0–46.0)
Hemoglobin: 7.5 g/dL — ABNORMAL LOW (ref 12.0–15.0)
LYMPHS ABS: 0.9 10*3/uL (ref 0.7–4.0)
LYMPHS PCT: 6 %
MCH: 27.7 pg (ref 26.0–34.0)
MCHC: 32.6 g/dL (ref 30.0–36.0)
MCV: 84.9 fL (ref 78.0–100.0)
MONO ABS: 2.1 10*3/uL — AB (ref 0.1–1.0)
MONOS PCT: 14 %
Neutro Abs: 12.3 10*3/uL — ABNORMAL HIGH (ref 1.7–7.7)
Neutrophils Relative %: 79 %
PLATELETS: 143 10*3/uL — AB (ref 150–400)
RBC: 2.71 MIL/uL — AB (ref 3.87–5.11)
RDW: 14.9 % (ref 11.5–15.5)
WBC: 15.4 10*3/uL — AB (ref 4.0–10.5)

## 2016-07-02 LAB — GLUCOSE, CAPILLARY
GLUCOSE-CAPILLARY: 196 mg/dL — AB (ref 65–99)
GLUCOSE-CAPILLARY: 236 mg/dL — AB (ref 65–99)
GLUCOSE-CAPILLARY: 175 mg/dL — AB (ref 65–99)
Glucose-Capillary: 180 mg/dL — ABNORMAL HIGH (ref 65–99)
Glucose-Capillary: 190 mg/dL — ABNORMAL HIGH (ref 65–99)
Glucose-Capillary: 214 mg/dL — ABNORMAL HIGH (ref 65–99)

## 2016-07-02 LAB — RENAL FUNCTION PANEL
Albumin: 1.7 g/dL — ABNORMAL LOW (ref 3.5–5.0)
Anion gap: 11 (ref 5–15)
BUN: 53 mg/dL — AB (ref 6–20)
CHLORIDE: 96 mmol/L — AB (ref 101–111)
CO2: 28 mmol/L (ref 22–32)
Calcium: 8.2 mg/dL — ABNORMAL LOW (ref 8.9–10.3)
Creatinine, Ser: 2.48 mg/dL — ABNORMAL HIGH (ref 0.44–1.00)
GFR calc Af Amer: 23 mL/min — ABNORMAL LOW (ref >60)
GFR, EST NON AFRICAN AMERICAN: 20 mL/min — AB (ref >60)
GLUCOSE: 205 mg/dL — AB (ref 65–99)
POTASSIUM: 3.9 mmol/L (ref 3.5–5.1)
Phosphorus: 2.7 mg/dL (ref 2.5–4.6)
Sodium: 135 mmol/L (ref 135–145)

## 2016-07-02 LAB — MAGNESIUM: Magnesium: 1.8 mg/dL (ref 1.7–2.4)

## 2016-07-02 MED ORDER — ASPIRIN 81 MG PO CHEW
162.0000 mg | CHEWABLE_TABLET | Freq: Every day | ORAL | Status: DC
  Administered 2016-07-02 – 2016-07-18 (×15): 162 mg
  Filled 2016-07-02 (×17): qty 2

## 2016-07-02 NOTE — Progress Notes (Signed)
Called pt's daughter, [name] [phone], to arrange a multidisciplinary mtg with CCM, Dr. Lorrene Reid with nephrology for 07/03/16. Daughter was very quick to tell me she has made her wishes known regarding maintaning a full code. I told her that was not the focus of the mtg but we need guidance in terms of planning for extubation. I attempted too prepare daughter to discuss:  if pt was not able to protect her airway after extubation would pt want to be reintubated but more likely would require a trach as well as a PEG. I also prepared her that a trach does not necessarily mean she would not require a ventilator as well, which directly impacts her level of care and the setting that this can be provided especially if she needs HD. She verbalized understanding and would like to proceed with mtg. Palliative Medicine Team to arrange and call Ms Beckey Downing as well as other providers Romona Curls, ANP

## 2016-07-02 NOTE — Progress Notes (Signed)
PULMONARY / CRITICAL CARE MEDICINE   Name: Angelica Beck MRN: [id number] DOB: 1951/08/20    ADMISSION DATE:  06/20/2016 CONSULTATION DATE:  06/20/2016  REFERRING MD:  EDP Dr Cathleen Fears   CHIEF COMPLAINT:  AMS  HISTORY OF PRESENT ILLNESS:   65 year old female with past medical history as below, which is significant for stroke with subsequent debilitation, diabetes, chronic kidney disease, chronic urinary tract infections due to incontinence, for which she takes chronic antibiotics. She lives at home with her daughter where she requires full assistance she is unable to truly ambulate and spends most of her time in a hospital bed/recliner. She relies on family members almost entirely for activities of daily living. She was in her usual state of health until about 9/23 when she developed nausea and vomiting. This persisted for 2 days until symptoms subsided, however, she continued to complain of abdominal pain. 9/26 when the patient's daughter got home from work the patient was found to be unresponsive. EMS was called and she was found hypoglycemic which was corrected, however, altered mental status did not improve. Upon arrival to the emergency department she continued to be lethargic and was found to be hypotensive with hypoxemia. Laboratory evaluation significant for potassium 7.4, bicarbonate less than 7, creatinine 6.51, BUN 95, WBC 12.3, lactic acid 13.88, pH 7.07.  SUBJECTIVE:   More awake Tolerating PSV, VT's in 400's  VITAL SIGNS: BP (!) 112/57   Pulse 100   Temp 99.3 F (37.4 C) (Axillary)   Resp (!) 25   Ht '5\' 6"'$  (1.676 m)   Wt 110.7 kg (244 lb 0.8 oz)   SpO2 98%   BMI 39.39 kg/m   HEMODYNAMICS: CVP:  [6 mmHg] 6 mmHg  VENTILATOR SETTINGS: Vent Mode: PSV;CPAP FiO2 (%):  [40 %] 40 % Set Rate:  [20 bmp] 20 bmp Vt Set:  [470 mL] 470 mL PEEP:  [5 cmH20] 5 cmH20 Pressure Support:  [5 cmH20] 5 cmH20 Plateau Pressure:  [17 cmH20] 17 cmH20  INTAKE / OUTPUT: I/O last 3  completed shifts: In: 2320 [I.V.:130; NG/GT:2190] Out: 2200 [Other:2000; Stool:200]  PHYSICAL EXAMINATION: General:  Obese female. Chronically ill appearing. Neuro: Opens eyes to voice, tracked, squeezed hand and wiggles toes but with minimal strength HEENT:  No scleral icterus or injection. ETT in place. Tacky mucous membranes with dried blood on lips. Cardiovascular:  Regular rhythm and rate. 2+ LE bilaterally to knee and puffy hands and feet. Unable to appreciate JVD. Lungs:  Coarse bilaterally.  Symmetric chest rise on ventilator.  Abdomen:  Soft. Protuberant. + BS. Patient grimaces with deep palpation and has bandages on lower abdomen for skin breakdown.  Musculoskeletal:  No joint deformity or effusion. No crepitus. Skin:  Warm & dry. Sacral sloughing.   LABS:  BMET  Recent Labs Lab 06/30/16 0450 07/01/16 0500 07/02/16 0437  NA 137 136 135  K 4.1 4.5 3.9  CL 97* 99* 96*  CO2 '26 26 28  '$ BUN 34* 63* 53*  CREATININE 2.48* 3.38* 2.48*  GLUCOSE 135* 166* 205*    Electrolytes  Recent Labs Lab 06/30/16 0450 06/30/16 0455 07/01/16 0500 07/02/16 0437  CALCIUM 8.7*  --  8.6* 8.2*  MG  --  2.2 2.3 1.8  PHOS 3.4  --  4.1 2.7    CBC  Recent Labs Lab 06/30/16 0455 06/30/16 1600 07/01/16 0500 07/02/16 0437  WBC 25.3*  --  19.7* 15.4*  HGB 6.8* 7.9* 7.7* 7.5*  HCT 20.6* 23.8* 23.7* 23.0*  PLT 104*  --  121* 143*    Coag's  Recent Labs Lab 06/26/16 0413 06/27/16 0420 06/29/16 0914  APTT 175* 151*  --   INR  --   --  1.38    Sepsis Markers  Recent Labs Lab 06/25/16 1302 06/26/16 0413  LATICACIDVEN 3.9* 1.8    ABG No results for input(s): PHART, PCO2ART, PO2ART in the last 168 hours.  Liver Enzymes  Recent Labs Lab 06/28/16 1831  06/30/16 0455 07/01/16 0500 07/02/16 0437  AST 2,157*  --  1,412* 2,162*  --   ALT 591*  --  385* 553*  --   ALKPHOS 639*  --  452* 594*  --   BILITOT 0.9  --  1.2 1.0  --   ALBUMIN 1.5*  < > 2.1* 1.8*  1.9*  1.7*  < > = values in this interval not displayed.  Cardiac Enzymes No results for input(s): TROPONINI, PROBNP in the last 168 hours.  Glucose  Recent Labs Lab 07/01/16 1532 07/01/16 1951 07/02/16 0007 07/02/16 0425 07/02/16 0829 07/02/16 1152  GLUCAP 130* 185* 190* 196* 214* 236*    Imaging No results found.  STUDIES:  CT Head 9/27:  No acute abnormality. Chronic small vessel ischemic changes. Chronic lacunar infarct left basal ganglia & chronic ischemia right side of pons.  CT Abd/Pelvis w/o 9/27:  Early partial SBO w/ transition point LLQ with adhesions. Left humerus soft tissue swelling & intramuscular gas. Punctate hepatic granulomas noted.  Left Arm X-ray 9/27:  Diffuse soft tissue edema.  TTE 9/27:  LV w/ EF 60-65%. Grade 1 Diastolic dysfunction. No regional wall motion abnormalities. LA & RA normal in size. RV normal in size & function. Mild AS w/o AR. Mild MR w/o MS.  ABIs 10/3: R normal. L posterior artery noncompressible; suspect calcification.  CT head 10/3: No acute infarct, no hemorrhage or mass. Atrophy evident. Similar to prior study.   MICROBIOLOGY: MRSA PCR 9/27:  Negative Blood Ctx x 2 9/26 >> Coag Neg Staph 1/2 Urine Ctx 9/26:  5000 CFU E coli & 1000 CFU Klebsiella   ANTIBIOTICS: Levaquin 9/26 - 9/28 Aztreonam 9/26 - 9/27 Vancomycin 9/26 >> off Merrem 9/27 >> 10/3  SIGNIFICANT EVENTS: 9/26 - admit for presumed septic shock  LINES/TUBES: OETT 7.5 9/26 >> L IJ CVL 9/26 >> Foley 9/26 >> RIJ HD 9/30 >> Rectal tube 10/6 >>  DISCUSSION: Angelica Beck is a 65-y/o female with T2DM, CKD, recurrent UTIs 2/2 incontinence, and stroke with subsequent severe debilitation (Left sided weakness; ECOG 4), who presented with AMS and found to be in shock, likely 2/2 UTI sepsis. Some improvement in level of alertness with CRRT. Completed 7-day course of meropenem 10/3. Has not been making urine after CRRT so received iHD 10/5. Making slow improvements in level of  alertness but unclear that she can successfully extubate.  Concern remains for ability to regain former function given reliance on hemodialysis and continued severe weakness. Suspect that we will need to consider extubation when we believe she is optimized, acknowledge that if she fails we would make her comfortable. scenario complicated by the fact that hepatic and renal injuries have not resolved either. Palliative care discussions needed here.   ASSESSMENT / PLAN:  PULMONARY A: Acute Hypoxic Respiratory Failure - Unable to protect airway with acute encephalopathy.  OHS P:   SBT as tolerates, more alert today. I would like to give her a trial of extubation, feel that she is close today. Would like to speak again with daughter about  whether she would want to be reintubated, optimally would like to involve the patient in this conversation.  Restart oral care for PNA prophylaxis  CXR intermittently.  CARDIOVASCULAR A:  Shock - Likely sepsis. Cortisol 45.1. Resolved. H/O HTN & HLD P:  Monitor hemodynamics.  RENAL A:   Acute On Chronic Renal Failure Stage IV  - worsens without HD treatments Profound Metabolic Acidosis  Hyperkalemia - Resolved.  Lactic Acidosis, suspect due to hypoperfusion with possible contribution of renal injury 2/2 metformin, persisting likely due to poor renal clearance.  Resolved 10/2 after CRRT. Hyperphosphatemia - Secondary to acute renal failure. Resolved. Pseudohypocalcemia - Improving.  P:   Monitoring UOP with Foley, minimal. Lasix given by Renal 10/3 without improved UOP.  CRRT ended 10/3, following renal fxn; iHD 10/5. She will not be a good candidate for long term HD. Have consulted Palliative Care with daughter's permission.   GASTROINTESTINAL A:   Partial SBO - Resolving on XR abd 9/30. Having BMs. H/O Ventral Hernia Repair. Possible Liver Cirrhosis. RUQ U/S normal. Ammonia WNL 10/5.  Transaminitis increasing, suspect shock liver from poor renal  clearance.  P:   Continue tube feeds. Pepcid IV daily.  CMP in AM  HEMATOLOGIC A:   Anemia - Chronic. Oozing wounds around mouth. Hgb stable after transfusion Leukocytosis - Likely due to sepsis. Improving slowly. P:  SCD's / aspirin. 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   INFECTIOUS A:   Severe Sepsis - due to E.coli and Klebsiella UTI. Unlikely Coag Neg Staph Bacteremia, suspect contaminant Leukocytosis improving Sacral wound P:   Completed course of meropenem Following cultures to completion. Holding home Trimethoprim. Wound care consult called  ENDOCRINE A:   Hypoglycemia - Resolved with Dextrose IVF. H/O DM Type 2 - A1c 5.8. H/O Hyperthyroidism - On PTU at home. TSH 0.331 & F T4 1.33 9/26. Repeat TSH 2.044 10/3.  P:   SSI per Sensitive Algorithm. Holding PTU & Metformin. Recheck TSH next week  NEUROLOGIC A:   Acute Encephalopathy - Question hepatic vs toxic metabolic vs hypoxia. No improvement in Lactulose Enema despite improved Ammonia.  Favor toxic metabolic as is gradually improving with CRRT. Repeat CT head unchanged. TSH WNL. Folate WNL. H/O CVA - Minimal mobility. P:   RASS goal: 0 to -1 Avoid sedating meds.  MUSCULOSKELETAL A: Edema Left Arm - Improved. Secondary to IV infiltration. No gas on X-ray 9/27. P: Continue to monitor   FAMILY  - Updates: None available 10/7. Daughter preferring as aggressive treatment as possible at this time, given small daily improvements.   - Inter-disciplinary family meet or Palliative Care meeting due by: Continued [place] discussion 10/6 with daughter [name].   Independent critical care time is 33 minutes.   Baltazar Apo, MD, PhD 07/02/2016, 12:35 PM [facility] [phone] or if no answer [phone]

## 2016-07-02 NOTE — Progress Notes (Addendum)
Patient ID: Angelica Beck, female   DOB: [birth year], 65 y.o.   MRN: [id number] S:awake and alert while on vent O:BP 115/64   Pulse 88   Temp 98.6 F (37 C) (Oral)   Resp 20   Ht '5\' 6"'$  (1.676 m)   Wt 110.7 kg (244 lb 0.8 oz)   SpO2 100%   BMI 39.39 kg/m   Intake/Output Summary (Last 24 hours) at 07/02/16 0857 Last data filed at 07/02/16 0700  Gross per 24 hour  Intake             1540 ml  Output             2200 ml  Net             -660 ml   Intake/Output: I/O last 3 completed shifts: In: 2320 [I.V.:130; NG/GT:2190] Out: 2200 [Other:2000; Stool:200]  Intake/Output this shift:  No intake/output data recorded. Weight change: 3.7 kg (8 lb 2.5 oz) PYK:DXIPJ AAF intubated CVS:no rub Resp:occ rhonchi ASN:KNLZJ Ext:+edema   Recent Labs Lab 06/26/16 1600 06/27/16 0420 06/28/16 0355 06/28/16 1831 06/29/16 0400 06/30/16 0450 06/30/16 0455 07/01/16 0500 07/02/16 0437  NA 138 135 138  --  137 137  --  136 135  K 4.3 4.1 4.5  --  4.8 4.1  --  4.5 3.9  CL 103 102 101  --  101 97*  --  99* 96*  CO2 '25 27 27  '$ --  25 26  --  26 28  GLUCOSE 145* 140* 152*  --  182* 135*  --  166* 205*  BUN 14 12 27*  --  48* 34*  --  63* 53*  CREATININE 1.37* 1.07* 1.97*  --  3.17* 2.48*  --  3.38* 2.48*  ALBUMIN 1.8* 1.9* 1.6* 1.5* 1.5* 2.1* 2.1* 1.8*  1.9* 1.7*  CALCIUM 7.8* 8.0* 8.3*  --  8.3* 8.7*  --  8.6* 8.2*  PHOS 1.5* 1.4* 3.1  --  4.1 3.4  --  4.1 2.7  AST  --   --   --  2,157*  --   --  1,412* 2,162*  --   ALT  --   --   --  591*  --   --  385* 553*  --    Liver Function Tests:  Recent Labs Lab 06/28/16 1831  06/30/16 0455 07/01/16 0500 07/02/16 0437  AST 2,157*  --  1,412* 2,162*  --   ALT 591*  --  385* 553*  --   ALKPHOS 639*  --  452* 594*  --   BILITOT 0.9  --  1.2 1.0  --   PROT 5.3*  --  5.5* 5.2*  --   ALBUMIN 1.5*  < > 2.1* 1.8*  1.9* 1.7*  < > = values in this interval not displayed. No results for input(s): LIPASE, AMYLASE in the last 168 hours.  Recent  Labs Lab 06/29/16 1130  AMMONIA 21   CBC:  Recent Labs Lab 06/28/16 0355 06/29/16 0400 06/30/16 0455 06/30/16 1600 07/01/16 0500 07/02/16 0437  WBC 29.6* 28.9* 25.3*  --  19.7* 15.4*  NEUTROABS 24.8* 23.2* 20.2*  --  15.9* 12.3*  HGB 7.9* 7.4* 6.8* 7.9* 7.7* 7.5*  HCT 24.6* 22.6* 20.6* 23.8* 23.7* 23.0*  MCV 83.7 83.4 83.1  --  84.0 84.9  PLT 121* 96* 104*  --  121* 143*   Cardiac Enzymes: No results for input(s): CKTOTAL, CKMB, CKMBINDEX, TROPONINI in the last 168 hours. CBG:  Recent Labs Lab 07/01/16 1532 07/01/16 1951 07/02/16 0007 07/02/16 0425 07/02/16 0829  GLUCAP 130* 185* 190* 196* 214*    Iron Studies: No results for input(s): IRON, TIBC, TRANSFERRIN, FERRITIN in the last 72 hours. Studies/Results: No results found. Marland Kitchen aspirin  150 mg Rectal Daily  . darbepoetin (ARANESP) injection - DIALYSIS  100 mcg Intravenous Q Mon-HD  . famotidine (PEPCID) IV  20 mg Intravenous Q24H  . feeding supplement (VITAL HIGH PROTEIN)  1,000 mL Per Tube Q24H  . insulin aspart  0-9 Units Subcutaneous Q4H    BMET    Component Value Date/Time   NA 135 07/02/2016 0437   K 3.9 07/02/2016 0437   CL 96 (L) 07/02/2016 0437   CO2 28 07/02/2016 0437   GLUCOSE 205 (H) 07/02/2016 0437   BUN 53 (H) 07/02/2016 0437   CREATININE 2.48 (H) 07/02/2016 0437   CALCIUM 8.2 (L) 07/02/2016 0437   GFRNONAA 20 (L) 07/02/2016 0437   GFRAA 23 (L) 07/02/2016 0437   CBC    Component Value Date/Time   WBC 15.4 (H) 07/02/2016 0437   RBC 2.71 (L) 07/02/2016 0437   HGB 7.5 (L) 07/02/2016 0437   HCT 23.0 (L) 07/02/2016 0437   HCT 26.8 (L) 06/27/2016 1536   PLT 143 (L) 07/02/2016 0437   MCV 84.9 07/02/2016 0437   MCH 27.7 07/02/2016 0437   MCHC 32.6 07/02/2016 0437   RDW 14.9 07/02/2016 0437   LYMPHSABS 0.9 07/02/2016 0437   MONOABS 2.1 (H) 07/02/2016 0437   EOSABS 0.1 07/02/2016 0437   BASOSABS 0.0 07/02/2016 0437     Assessment/Plan:  1. AKI/CKD in setting of shock/sepsis.   Initiated on CRRT 9/30-10/3/17.  Remains oliguric and transitioned to IHD.  Pt is a poor candidate for longterm HD due to advanced dementia and poor functional status.  Discussed condition with her daughter by phone on 06/30/16 who is not realistic about the the severity of her mother's illness, baseline functional status, and overall prognosis.  She sees no reason to stop HD as she believes her mother is better with HD.  She underwent HD on 07/01/16 without incident.   1. will need to have temp HD catheter replaced tomorrow or Tuesday.   2. If there is a concern about ongoing infection, then would require trialysis cath (untunneled) 3. Poor candidate for longterm dialysis and is not a candidate for home hemodialysis at this time either.  She is looking more like she will require Longterm care facility or a place where they could perform dialysis with a stretcher if she is extubated and does not require trach.  4. Daughter still under the impression that she can do home dialysis but does not recognize the change of her mother's condition over the last year.  Appreciate palliative care input and ongoing discussion.  2. Shock/sepsis- per PCCM.   3. VDRF- per PCCM 4. Anemia- on ESA.  Transfuse prn.   5. Dementia- per primary nephrologist, Dr. Lorrene Reid it is advanced at baseline 6. Disposition- overall prognosis is poor.  She has a poor functional and cognitive status at baseline and is not a longterm candidate for HD.  Agree with Palliative care, however daughter has unrealistic expectations on mother's recovery/prognosis.  Continue to encourage and educate.  May need to involve ethics if Palliative care cannot make headway with daughter and Mrs. Kimm does not make any clinical improvements.  I will discuss case with her primary Nephrologist, Dr. Lorrene Reid with whom the patient's daughter feels more comfortable.  [place]

## 2016-07-02 NOTE — Progress Notes (Signed)
Spoke to Dr. Lamonte Sakai about the need of foley, if there is no Urinary output for more than 2 days. MD VO to remove the foley

## 2016-07-03 DIAGNOSIS — Z515 Encounter for palliative care: Secondary | ICD-10-CM

## 2016-07-03 DIAGNOSIS — N184 Chronic kidney disease, stage 4 (severe): Secondary | ICD-10-CM

## 2016-07-03 DIAGNOSIS — N170 Acute kidney failure with tubular necrosis: Secondary | ICD-10-CM

## 2016-07-03 LAB — COMPREHENSIVE METABOLIC PANEL
ALBUMIN: 1.6 g/dL — AB (ref 3.5–5.0)
ALK PHOS: 480 U/L — AB (ref 38–126)
ALT: 208 U/L — AB (ref 14–54)
ANION GAP: 12 (ref 5–15)
AST: 450 U/L — ABNORMAL HIGH (ref 15–41)
BUN: 87 mg/dL — ABNORMAL HIGH (ref 6–20)
CALCIUM: 8.5 mg/dL — AB (ref 8.9–10.3)
CO2: 27 mmol/L (ref 22–32)
Chloride: 96 mmol/L — ABNORMAL LOW (ref 101–111)
Creatinine, Ser: 3.31 mg/dL — ABNORMAL HIGH (ref 0.44–1.00)
GFR calc Af Amer: 16 mL/min — ABNORMAL LOW (ref >60)
GFR calc non Af Amer: 14 mL/min — ABNORMAL LOW (ref >60)
GLUCOSE: 230 mg/dL — AB (ref 65–99)
Potassium: 4.3 mmol/L (ref 3.5–5.1)
Sodium: 135 mmol/L (ref 135–145)
Total Bilirubin: 0.5 mg/dL (ref 0.3–1.2)
Total Protein: 5.1 g/dL — ABNORMAL LOW (ref 6.5–8.1)

## 2016-07-03 LAB — CBC WITH DIFFERENTIAL/PLATELET
BASOS ABS: 0 10*3/uL (ref 0.0–0.1)
Basophils Relative: 0 %
EOS ABS: 0 10*3/uL (ref 0.0–0.7)
Eosinophils Relative: 0 %
HCT: 22.4 % — ABNORMAL LOW (ref 36.0–46.0)
Hemoglobin: 7.2 g/dL — ABNORMAL LOW (ref 12.0–15.0)
LYMPHS PCT: 4 %
Lymphs Abs: 1.2 10*3/uL (ref 0.7–4.0)
MCH: 27.7 pg (ref 26.0–34.0)
MCHC: 32.1 g/dL (ref 30.0–36.0)
MCV: 86.2 fL (ref 78.0–100.0)
Monocytes Absolute: 2.4 10*3/uL — ABNORMAL HIGH (ref 0.1–1.0)
Monocytes Relative: 8 %
NEUTROS PCT: 88 %
Neutro Abs: 26.7 10*3/uL — ABNORMAL HIGH (ref 1.7–7.7)
PLATELETS: 176 10*3/uL (ref 150–400)
RBC: 2.6 MIL/uL — ABNORMAL LOW (ref 3.87–5.11)
RDW: 14.9 % (ref 11.5–15.5)
WBC: 30.3 10*3/uL — ABNORMAL HIGH (ref 4.0–10.5)

## 2016-07-03 LAB — GLUCOSE, CAPILLARY
GLUCOSE-CAPILLARY: 200 mg/dL — AB (ref 65–99)
GLUCOSE-CAPILLARY: 222 mg/dL — AB (ref 65–99)
GLUCOSE-CAPILLARY: 234 mg/dL — AB (ref 65–99)
Glucose-Capillary: 175 mg/dL — ABNORMAL HIGH (ref 65–99)
Glucose-Capillary: 220 mg/dL — ABNORMAL HIGH (ref 65–99)
Glucose-Capillary: 226 mg/dL — ABNORMAL HIGH (ref 65–99)
Glucose-Capillary: 229 mg/dL — ABNORMAL HIGH (ref 65–99)

## 2016-07-03 LAB — MAGNESIUM: MAGNESIUM: 1.8 mg/dL (ref 1.7–2.4)

## 2016-07-03 LAB — T4, FREE: FREE T4: 1.3 ng/dL — AB (ref 0.61–1.12)

## 2016-07-03 LAB — TSH: TSH: 0.554 u[IU]/mL (ref 0.350–4.500)

## 2016-07-03 LAB — PHOSPHORUS: Phosphorus: 2 mg/dL — ABNORMAL LOW (ref 2.5–4.6)

## 2016-07-03 MED ORDER — PANTOPRAZOLE SODIUM 40 MG PO PACK
40.0000 mg | PACK | Freq: Every day | ORAL | Status: DC
  Administered 2016-07-03 – 2016-07-05 (×3): 40 mg
  Filled 2016-07-03 (×3): qty 20

## 2016-07-03 MED ORDER — SODIUM CHLORIDE 0.9 % IV SOLN
10.0000 mg/h | INTRAVENOUS | Status: DC
  Filled 2016-07-03: qty 10

## 2016-07-03 MED ORDER — MORPHINE BOLUS VIA INFUSION
5.0000 mg | INTRAVENOUS | Status: DC | PRN
  Filled 2016-07-03: qty 20

## 2016-07-03 NOTE — Progress Notes (Signed)
S: intubated O:BP 124/61   Pulse (!) 102   Temp (!) 100.6 F (38.1 C) (Oral) Comment: Notified RN- Teresa   Resp (!) 28   Ht '5\' 6"'$  (1.676 m)   Wt 110.9 kg (244 lb 7.8 oz)   SpO2 100%   BMI 39.46 kg/m   Intake/Output Summary (Last 24 hours) at 07/03/16 0916 Last data filed at 07/03/16 0600  Gross per 24 hour  Intake              820 ml  Output               75 ml  Net              745 ml   Weight change: -1.3 kg (-2 lb 13.9 oz) YYF:[id number] but alert CVS:RRR Resp:clear ant Abd: +BS NTND Ext: tr-1+ edema NEURO: eyes opened, responds to some ?, unable to move extremeties Rt IJ HD cath Lt IJ triple lumen   . aspirin  162 mg Per Tube Daily  . darbepoetin (ARANESP) injection - DIALYSIS  100 mcg Intravenous Q Mon-HD  . famotidine (PEPCID) IV  20 mg Intravenous Q24H  . feeding supplement (VITAL HIGH PROTEIN)  1,000 mL Per Tube Q24H  . insulin aspart  0-9 Units Subcutaneous Q4H   No results found. BMET    Component Value Date/Time   NA 135 07/03/2016 0430   K 4.3 07/03/2016 0430   CL 96 (L) 07/03/2016 0430   CO2 27 07/03/2016 0430   GLUCOSE 230 (H) 07/03/2016 0430   BUN 87 (H) 07/03/2016 0430   CREATININE 3.31 (H) 07/03/2016 0430   CALCIUM 8.5 (L) 07/03/2016 0430   GFRNONAA 14 (L) 07/03/2016 0430   GFRAA 16 (L) 07/03/2016 0430   CBC    Component Value Date/Time   WBC 30.3 (H) 07/03/2016 0430   RBC 2.60 (L) 07/03/2016 0430   HGB 7.2 (L) 07/03/2016 0430   HCT 22.4 (L) 07/03/2016 0430   HCT 26.8 (L) 06/27/2016 1536   PLT 176 07/03/2016 0430   MCV 86.2 07/03/2016 0430   MCH 27.7 07/03/2016 0430   MCHC 32.1 07/03/2016 0430   RDW 14.9 07/03/2016 0430   LYMPHSABS 1.2 07/03/2016 0430   MONOABS 2.4 (H) 07/03/2016 0430   EOSABS 0.0 07/03/2016 0430   BASOSABS 0.0 07/03/2016 0430     Assessment:  1. Acute on CKD 3 secondary to ATN from shock/sepsis 2. VDRF 3 Anemia on aranesp 4. ? HIT, Plt better Plan: 1. Plan HD tomorrow 2. PO4 sl low, will follow and if  decreases further then supplement   Commodore Bellew T

## 2016-07-03 NOTE — Consult Note (Addendum)
[facility] Nurse wound consult note Reason for Consult: Consult requested for lip and sacrum/buttocks Wound type: Left lip with dark red dry abrasions, .2X.2cm and right lip 1.5X.3cm.  Both are full thickness to mucous membranes.  No bleeding, odor, or drainage. Measurement: Sacrum, lower buttocks, and left buttocks with patchy areas of partial thickness skin loss; pink moist skin is revealed, surrounded by loose peeling skin.  Appearance consistent with moisture associated skin damage, NOT pressure. Sacrum 2X2X.1cm, left lower buttocks 2X1X.1cm, lower buttocks near rectum .3X.3X.1cm Drainage (amount, consistency, odor) Small amt yellow drainage, no odor.  It is difficult to keep wounds from becoming soiled from loose diarrhea stools related to location close to the rectum. Dressing procedure/placement/frequency: Vaseline to promote moist healing to lips.  Foam dressing to protect and promote healing to buttocks and sacrum.  Pt is on a Sport low air loss bed to reduce pressure.  No family members present to discuss plan of care. Please re-consult if further assistance is needed.  Thank-you,  Julien Girt MSN, [place], [place], [place], [place]

## 2016-07-03 NOTE — Progress Notes (Signed)
PULMONARY / CRITICAL CARE MEDICINE   Name: Angelica Beck MRN: [id number] DOB: Feb 17, 1951    ADMISSION DATE:  06/20/2016 CONSULTATION DATE:  06/20/2016  REFERRING MD:  EDP Dr Cathleen Fears   CHIEF COMPLAINT:  AMS  HISTORY OF PRESENT ILLNESS:   65 year old female with past medical history as below, which is significant for stroke with subsequent debilitation, diabetes, chronic kidney disease, chronic urinary tract infections due to incontinence, for which she takes chronic antibiotics. She lives at home with her daughter where she requires full assistance she is unable to truly ambulate and spends most of her time in a hospital bed/recliner. She relies on family members almost entirely for activities of daily living. She was in her usual state of health until about 9/23 when she developed nausea and vomiting. This persisted for 2 days until symptoms subsided, however, she continued to complain of abdominal pain. 9/26 when the patient's daughter got home from work the patient was found to be unresponsive. EMS was called and she was found hypoglycemic which was corrected, however, altered mental status did not improve. Upon arrival to the emergency department she continued to be lethargic and was found to be hypotensive with hypoxemia. Laboratory evaluation significant for potassium 7.4, bicarbonate less than 7, creatinine 6.51, BUN 95, WBC 12.3, lactic acid 13.88, pH 7.07.  SUBJECTIVE:   More awake Tolerating PSV, VT's in 400's  VITAL SIGNS: BP 124/61   Pulse (!) 102   Temp (!) 100.6 F (38.1 C) (Oral) Comment: Notified RN- Teresa   Resp (!) 28   Ht '5\' 6"'$  (1.676 m)   Wt 244 lb 7.8 oz (110.9 kg)   SpO2 100%   BMI 39.46 kg/m   HEMODYNAMICS:    VENTILATOR SETTINGS: Vent Mode: PSV;CPAP FiO2 (%):  [40 %] 40 % Set Rate:  [20 bmp] 20 bmp Vt Set:  [470 mL] 470 mL PEEP:  [5 cmH20] 5 cmH20 Pressure Support:  [5 cmH20-10 cmH20] 10 cmH20 Plateau Pressure:  [20 cmH20] 20 cmH20  INTAKE /  OUTPUT: I/O last 3 completed shifts: In: 3419 [I.V.:340; NG/GT:1440; IV Piggyback:50] Out: 275 [Stool:275]  PHYSICAL EXAMINATION: General:  Obese female. Chronically ill appearing. Neuro: Opens eyes to voice, tracked, nods, squeezed hand and wiggles toes but with minimal strength (R > L) HEENT:  No scleral icterus or injection. ETT in place. Tacky mucous membranes with dried blood on lips. Cardiovascular:  Regular rhythm and rate. 1+ LE bilaterally to knee and puffy hands and feet. Unable to appreciate JVD. Lungs: CTAB. Symmetric chest rise on ventilator.  Abdomen:  Soft. Protuberant. + BS. Patient grimaces with deep palpation and has bandages on lower abdomen for skin breakdown.  Musculoskeletal:  No joint deformity or effusion. No crepitus. Skin:  Warm & dry. Sacral sloughing.   LABS:  BMET  Recent Labs Lab 07/01/16 0500 07/02/16 0437 07/03/16 0430  NA 136 135 135  K 4.5 3.9 4.3  CL 99* 96* 96*  CO2 '26 28 27  '$ BUN 63* 53* 87*  CREATININE 3.38* 2.48* 3.31*  GLUCOSE 166* 205* 230*    Electrolytes  Recent Labs Lab 07/01/16 0500 07/02/16 0437 07/03/16 0430  CALCIUM 8.6* 8.2* 8.5*  MG 2.3 1.8 1.8  PHOS 4.1 2.7 2.0*    CBC  Recent Labs Lab 07/01/16 0500 07/02/16 0437 07/03/16 0430  WBC 19.7* 15.4* 30.3*  HGB 7.7* 7.5* 7.2*  HCT 23.7* 23.0* 22.4*  PLT 121* 143* 176    Coag's  Recent Labs Lab 06/27/16 0420 06/29/16 0914  APTT  151*  --   INR  --  1.38    Sepsis Markers No results for input(s): LATICACIDVEN, PROCALCITON, O2SATVEN in the last 168 hours.  ABG No results for input(s): PHART, PCO2ART, PO2ART in the last 168 hours.  Liver Enzymes  Recent Labs Lab 06/30/16 0455 07/01/16 0500 07/02/16 0437 07/03/16 0430  AST 1,412* 2,162*  --  450*  ALT 385* 553*  --  208*  ALKPHOS 452* 594*  --  480*  BILITOT 1.2 1.0  --  0.5  ALBUMIN 2.1* 1.8*  1.9* 1.7* 1.6*    Cardiac Enzymes No results for input(s): TROPONINI, PROBNP in the last 168  hours.  Glucose  Recent Labs Lab 07/02/16 1152 07/02/16 1634 07/02/16 1944 07/03/16 0016 07/03/16 0345 07/03/16 0740  GLUCAP 236* 180* 175* 175* 222* 200*    Imaging No results found.  STUDIES:  CT Head 9/27:  No acute abnormality. Chronic small vessel ischemic changes. Chronic lacunar infarct left basal ganglia & chronic ischemia right side of pons.  CT Abd/Pelvis w/o 9/27:  Early partial SBO w/ transition point LLQ with adhesions. Left humerus soft tissue swelling & intramuscular gas. Punctate hepatic granulomas noted.  Left Arm X-ray 9/27:  Diffuse soft tissue edema.  TTE 9/27:  LV w/ EF 60-65%. Grade 1 Diastolic dysfunction. No regional wall motion abnormalities. LA & RA normal in size. RV normal in size & function. Mild AS w/o AR. Mild MR w/o MS.  ABIs 10/3: R normal. L posterior artery noncompressible; suspect calcification.  CT head 10/3: No acute infarct, no hemorrhage or mass. Atrophy evident. Similar to prior study.   MICROBIOLOGY: MRSA PCR 9/27:  Negative Blood Ctx x 2 9/26 >> Coag Neg Staph 1/2 Urine Ctx 9/26:  5000 CFU E coli & 1000 CFU Klebsiella   ANTIBIOTICS: Levaquin 9/26 - 9/28 Aztreonam 9/26 - 9/27 Vancomycin 9/26 >> off Merrem 9/27 >> 10/3  SIGNIFICANT EVENTS: 9/26 - admit for presumed septic shock  LINES/TUBES: OETT 7.5 9/26 >> L IJ CVL 9/26 >> Foley 9/26 >> 10/8 RIJ HD 9/30 >> Rectal tube 10/6 >>  DISCUSSION: Angelica Beck is a 65-y/o female with T2DM, CKD, recurrent UTIs 2/2 incontinence, and stroke with subsequent severe debilitation (Left sided weakness; ECOG 4), who presented with AMS and found to be in shock, likely 2/2 UTI sepsis. Some improvement in level of alertness with CRRT. Completed 7-day course of meropenem 10/3. Has not been making urine after CRRT so received iHD 10/5. Making slow improvements in level of alertness but unclear that she can successfully extubate.  Concern remains for ability to regain former function given  reliance on hemodialysis and continued severe weakness. Suspect that we will need to consider extubation when we believe she is optimized, acknowledge that if she fails we would make her comfortable. Scenario complicated by the fact that hepatic and renal injuries have not resolved either. Daughter wanting aggressive care. Would limit interventions if certain mother could not return home. Palliative care discussions ongoing.   ASSESSMENT / PLAN:  PULMONARY A: Acute Hypoxic Respiratory Failure - Unable to protect airway with acute encephalopathy.  OHS P:   SBT as tolerated, more alert today. Possible extubation 10/9 after Palliative meeting with daughter and Renal to determine if trach/PEG would be desired if extubation trial unsuccessful Oral care for PNA prophylaxis  CXR intermittently.  CARDIOVASCULAR A:  Shock - Likely sepsis. Cortisol 45.1. Resolved. H/O HTN & HLD P:  Monitor hemodynamics.  RENAL A:   Acute On Chronic Renal Failure  Stage IV  - worsens without HD treatments Profound Metabolic Acidosis  Hyperkalemia - Resolved.  Lactic Acidosis, suspect due to hypoperfusion with possible contribution of renal injury 2/2 metformin, persisting likely due to poor renal clearance.  Resolved 10/2 after CRRT. Hyperphosphatemia - Secondary to acute renal failure. Resolved. Pseudohypocalcemia - Improving.  P:   Monitoring UOP with Foley, minimal. Lasix given by Renal 10/3 without improved UOP.  CRRT ended 10/3, following renal fxn; iHD 10/5. She will not be a good candidate for long term HD. Have consulted Palliative Care with daughter's permission.   GASTROINTESTINAL A:   Partial SBO - Resolving on XR abd 9/30. Having BMs. H/O Ventral Hernia Repair. Possible Liver Cirrhosis. RUQ U/S normal. Ammonia WNL 10/5.  Transaminitis improving, suspect shock liver from poor renal clearance.  P:   Continue tube feeds. Pepcid IV daily.  CMP in AM  HEMATOLOGIC A:   Anemia - Chronic. Oozing  wounds around mouth. Hgb decreasing slowly after transfusion (7.2 on 10/9) Leukocytosis - Likely due to sepsis. Now increasing. P:  SCD's / aspirin. 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  ESA per renal  INFECTIOUS A:   Severe Sepsis - due to E.coli and Klebsiella UTI. Unlikely Coag Neg Staph Bacteremia, suspect contaminant Leukocytosis rising again Sacral wound P:   Completed course of meropenem Following cultures to completion. Holding home Trimethoprim. Wound care consult called Consider CXR  ENDOCRINE A:   Hypoglycemia - Resolved with Dextrose IVF. H/O DM Type 2 - A1c 5.8. H/O Hyperthyroidism - On PTU at home. TSH 0.331 & F T4 1.33 9/26. Repeat TSH 2.044 10/3.  P:   SSI per Sensitive Algorithm. Holding PTU & Metformin. Recheck TSH this week  NEUROLOGIC A:   Acute Encephalopathy - Question hepatic vs toxic metabolic vs hypoxia. No improvement in Lactulose Enema despite improved Ammonia.  Favor toxic metabolic as is gradually improving with CRRT. Repeat CT head unchanged. TSH WNL. Folate WNL. H/O CVA - Minimal mobility. P:   RASS goal: 0 to -1 Avoid sedating meds.  MUSCULOSKELETAL A: Edema Left Arm - Improved. Secondary to IV infiltration. No gas on X-ray 9/27. P: Continue to monitor   FAMILY  - Updates: None available 10/9. Daughter continues to want as aggressive treatment as possible at this time, given small daily improvements.   - Inter-disciplinary family meet or Palliative Care meeting due by: Performed 10/6, 10/7, to continue 10/9.   Olene Floss, MD  [place], PGY-2

## 2016-07-03 NOTE — Progress Notes (Signed)
Inpatient Diabetes Program Recommendations  AACE/ADA: New Consensus Statement on Inpatient Glycemic Control (2015)  Target Ranges:  Prepandial:   less than 140 mg/dL      Peak postprandial:   less than 180 mg/dL (1-2 hours)      Critically ill patients:  140 - 180 mg/dL   Lab Results  Component Value Date   GLUCAP 200 (H) 07/03/2016   HGBA1C 5.8 (H) 06/21/2016    Review of Glycemic ControlResults for RANESHIA, DERICK (MRN [id number]) as of 07/03/2016 10:22  Ref. Range 07/02/2016 16:34 07/02/2016 19:44 07/03/2016 00:16 07/03/2016 03:45 07/03/2016 07:40  Glucose-Capillary Latest Ref Range: 65 - 99 mg/dL 180 (H) 175 (H) 175 (H) 222 (H) 200 (H)    Diabetes history: Type 2 diabetes Outpatient Diabetes medications: Lantus 10 units with breakfast/Lantus 20 units q PM, Metformin 1000 mg bid Current orders for Inpatient glycemic control:  Novolog sensitive q 4 hours  Inpatient Diabetes Program Recommendations:    Note blood sugars slightly greater than goal.  Consider the adding Lantus 10 units daily (1/3 of home dose).  Will follow.  Thanks, Adah Perl, RN, BC-ADM Inpatient Diabetes Coordinator Pager [phone] (8a-5p)

## 2016-07-03 NOTE — Care Management Note (Addendum)
Case Management Note  Patient Details  Name: Kemaria Dedic MRN: [id number] Date of Birth: September 30, 1950  Subjective/Objective:     CM following for progression and d/c planning.                Action/Plan: 07/03/2016 Noted ongoing efforts to wean this pt and currently HD is continuing. Plans to discuss options with daughter. LTAC referral made and this CM requested an eval by LTAC for possible admission.  [facility] referral made after discussion with Dr Deirdre Pippins as medical director.  3pm both [facility] and [facility] have made offers for this pt  As a potential admission.  Expected Discharge Date:                  Expected Discharge Plan:  Long Term Acute Care (LTAC)  In-House Referral:  NA  Discharge planning Services  CM Consult  Post Acute Care Choice:    Choice offered to:     DME Arranged:    DME Agency:     HH Arranged:    HH Agency:     Status of Service:  In process, will continue to follow  If discussed at Long Length of Stay Meetings, dates discussed:    Additional Comments:  Adron Bene, RN 07/03/2016, 12:22 PM

## 2016-07-03 NOTE — Progress Notes (Signed)
PULMONARY / CRITICAL CARE MEDICINE   Name: Angelica Beck MRN: [id number] DOB: [birth year]    ADMISSION DATE:  06/20/2016 CONSULTATION DATE:  06/20/2016  REFERRING MD:  EDP Dr Cathleen Fears   CHIEF COMPLAINT:  AMS  HISTORY OF PRESENT ILLNESS:   65 year old female with past medical history as below, which is significant for stroke with subsequent debilitation, diabetes, chronic kidney disease, chronic urinary tract infections due to incontinence, for which she takes chronic antibiotics. She lives at home with her daughter where she requires full assistance she is unable to truly ambulate and spends most of her time in a hospital bed/recliner. She relies on family members almost entirely for activities of daily living. She was in her usual state of health until about 9/23 when she developed nausea and vomiting. This persisted for 2 days until symptoms subsided, however, she continued to complain of abdominal pain. 9/26 when the patient's daughter got home from work the patient was found to be unresponsive. EMS was called and she was found hypoglycemic which was corrected, however, altered mental status did not improve. Upon arrival to the emergency department she continued to be lethargic and was found to be hypotensive with hypoxemia. Laboratory evaluation significant for potassium 7.4, bicarbonate less than 7, creatinine 6.51, BUN 95, WBC 12.3, lactic acid 13.88, pH 7.07.  SUBJECTIVE:  Mental status slowly improving. Seems to nod appropriately. No acute events overnight.   REVIEW OF SYSTEMS:  Unable to obtain with intubation & altered mental status.   VITAL SIGNS: BP (!) 125/56   Pulse (!) 103   Temp (!) 100.6 F (38.1 C) (Oral) Comment: Notified RN- Teresa   Resp (!) 27   Ht '5\' 6"'$  (1.676 m)   Wt 244 lb 7.8 oz (110.9 kg)   SpO2 100%   BMI 39.46 kg/m   HEMODYNAMICS:    VENTILATOR SETTINGS: Vent Mode: PSV;CPAP FiO2 (%):  [40 %] 40 % Set Rate:  [20 bmp] 20 bmp Vt Set:  [470 mL] 470  mL PEEP:  [5 cmH20] 5 cmH20 Pressure Support:  [5 cmH20-10 cmH20] 10 cmH20 Plateau Pressure:  [20 cmH20] 20 cmH20  INTAKE / OUTPUT: I/O last 3 completed shifts: In: 1900 [I.V.:350; NG/GT:1500; IV Piggyback:50] Out: 275 [Stool:275]  PHYSICAL EXAMINATION: General:  Obese female. No distress. No family at bedside. Watching TV. Neuro: Nods appropriately. Tracks to voice.  HEENT:  No scleral icterus. ETT in place. Cardiovascular:  Regular rhythm and rate. Pitting lower extremity edema. Lungs:  Coarse bilaterally.  Symmetric chest wall rise on ventilator.  Abdomen:  Soft. Protuberant. Hyperactive bowel sounds. Musculoskeletal:  No joint deformity or effusion.  Skin:  Warm & dry. Sacral sloughing.   LABS:  BMET  Recent Labs Lab 07/01/16 0500 07/02/16 0437 07/03/16 0430  NA 136 135 135  K 4.5 3.9 4.3  CL 99* 96* 96*  CO2 '26 28 27  '$ BUN 63* 53* 87*  CREATININE 3.38* 2.48* 3.31*  GLUCOSE 166* 205* 230*    Electrolytes  Recent Labs Lab 07/01/16 0500 07/02/16 0437 07/03/16 0430  CALCIUM 8.6* 8.2* 8.5*  MG 2.3 1.8 1.8  PHOS 4.1 2.7 2.0*    CBC  Recent Labs Lab 07/01/16 0500 07/02/16 0437 07/03/16 0430  WBC 19.7* 15.4* 30.3*  HGB 7.7* 7.5* 7.2*  HCT 23.7* 23.0* 22.4*  PLT 121* 143* 176    Coag's  Recent Labs Lab 06/27/16 0420 06/29/16 0914  APTT 151*  --   INR  --  1.38    Sepsis Markers No results  for input(s): LATICACIDVEN, PROCALCITON, O2SATVEN in the last 168 hours.  ABG No results for input(s): PHART, PCO2ART, PO2ART in the last 168 hours.  Liver Enzymes  Recent Labs Lab 06/30/16 0455 07/01/16 0500 07/02/16 0437 07/03/16 0430  AST 1,412* 2,162*  --  450*  ALT 385* 553*  --  208*  ALKPHOS 452* 594*  --  480*  BILITOT 1.2 1.0  --  0.5  ALBUMIN 2.1* 1.8*  1.9* 1.7* 1.6*    Cardiac Enzymes No results for input(s): TROPONINI, PROBNP in the last 168 hours.  Glucose  Recent Labs Lab 07/02/16 1152 07/02/16 1634 07/02/16 1944  07/03/16 0016 07/03/16 0345 07/03/16 0740  GLUCAP 236* 180* 175* 175* 222* 200*    Imaging No results found.  STUDIES:  CT Head 9/27:  No acute abnormality. Chronic small vessel ischemic changes. Chronic lacunar infarct left basal ganglia & chronic ischemia right side of pons.  CT Abd/Pelvis w/o 9/27:  Early partial SBO w/ transition point LLQ with adhesions. Left humerus soft tissue swelling & intramuscular gas. Punctate hepatic granulomas noted.  Left Arm X-ray 9/27:  Diffuse soft tissue edema.  TTE 9/27:  LV w/ EF 60-65%. Grade 1 Diastolic dysfunction. No regional wall motion abnormalities. LA & RA normal in size. RV normal in size & function. Mild AS w/o AR. Mild MR w/o MS.  ABIs 10/3: R normal. L posterior artery noncompressible; suspect calcification.  CT head 10/3: No acute infarct, no hemorrhage or mass. Atrophy evident. Similar to prior study.   MICROBIOLOGY: MRSA PCR 9/27:  Negative Blood Ctx x 2 9/26 >> Coag Neg Staph 1/2 Urine Ctx 9/26:  5000 CFU E coli & 1000 CFU Klebsiella   ANTIBIOTICS: Levaquin 9/26 - 9/28 Aztreonam 9/26 - 9/27 Vancomycin 9/26 - off Merrem 9/27 - 10/3  SIGNIFICANT EVENTS: 9/26 - admit for presumed septic shock  LINES/TUBES: OETT 7.5 9/26 >> L IJ CVL 9/26 >> Foley 9/26 >> RIJ HD 9/30 >> Rectal tube 10/6 >>  ASSESSMENT / PLAN:  PULMONARY A: Acute Hypoxic Respiratory Failure - Unable to protect airway with acute encephalopathy.  OHS  P:   SBT & PS wean as tolerated  Full Vent Support for rest Need to discuss plans for one-way extubation with daughter  CARDIOVASCULAR A:  Shock - Likely sepsis. Cortisol 45.1. Resolved. H/O HTN & HLD  P:  Vitals per unit protocol Continuous telemetry monitoring  RENAL A:   Acute On Chronic Renal Failure Stage IV  - worsens without HD treatments Profound Metabolic Acidosis - Resolved w/ HD. Hyperkalemia - Resolved.  Lactic Acidosis - Resolved. Likely due to hypoperfusion in setting of  Metformin.   Hyperphosphatemia - Secondary to acute renal failure. Resolved. Pseudohypocalcemia - Improving.   P:   Monitoring UOP with Foley Not a good long-term candidate for HD per Nephrology Appreciate Nephrology recommendations Trending electrolytes daily Replacing electrolytes as indicated  GASTROINTESTINAL A:   Partial SBO - Resolving on XR abd 9/30. Having BMs. H/O Ventral Hernia Repair. Possible Liver Cirrhosis. RUQ U/S normal. Ammonia WNL 10/5.  Transaminitis - Likely shock liver versus congestive hepatopathy. Improving.   P:   NPO Continuing Tube Feedings D/C Pepcid  Changing to Protonix VT daily Trending LFTs every other day  HEMATOLOGIC A:   Anemia - Chronic. Oozing wounds around mouth. Hgb stable after transfusion 1u on 10/6. Leukocytosis - Possible sepsis. Worse today.  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   P:  Trending cell counts daily w/ CBC Transfusion goal of 7.0 SCDs  SRA pending Holding Heparin  INFECTIOUS A:   Severe Sepsis  E.coli and Klebsiella UTI. Sacral wound  P:   S/P Merrem for 7 days Holding home Trimethoprim. Wound care consulted Plan to re-culture for fever   ENDOCRINE A:   Hypoglycemia - Resolved with Dextrose IVF. H/O DM Type 2 - A1c 5.8. H/O Hyperthyroidism - On PTU at home. TSH 0.331 & F T4 1.33 9/26. Repeat TSH 2.044 10/3.   P:   SSI per Sensitive Algorithm. Holding PTU & Metformin. Recheck TSH & Free T4 today   NEUROLOGIC A:   Acute Encephalopathy - Question hepatic vs toxic metabolic vs hypoxia. No improvement in Lactulose Enema despite improved Ammonia.  Favor toxic metabolic as is gradually improving with CRRT. Repeat CT head unchanged. TSH WNL. Folate WNL. H/O CVA - Minimal mobility.  P:   RASS goal: 0 to -1 Avoid sedating meds Fentanyl IV prn pain/discomfort  MUSCULOSKELETAL A: Edema Left Arm - Improved. Secondary to IV infiltration. No gas on X-ray 9/27. P: Continue to monitor   FAMILY  - Updates: None  available 10/7. Daughter preferring as aggressive treatment as possible at this time, given small daily improvements.   - Inter-disciplinary family meet or Palliative Care meeting due by: Continued [place] discussion 10/6 with daughter [name].   TODAY'S SUMMAR:  65 y.o. female with T2DM, CKD, recurrent UTIs 2/2 incontinence, and stroke with subsequent severe debilitation (Left sided weakness; ECOG 4), who presented with AMS and found to be in shock, likely 2/2 UTI sepsis. Some improvement in level of alertness with CRRT. Completed 7-day course of meropenem 10/3. Failing SBT today. Continuing intermittent HD. Discussed with her outpatient nephrologist. Awaiting daughter's arrival.   I have spent a total of 39 minutes of critical care time today caring for the patient, discussing plan of care with Dr. Lorrene Reid, and reviewing the patient's electronic medical record.   Sonia Baller Ashok Cordia, M.D. [facility] Pulmonary & Critical Care Pager:  [phone] After 3pm or if no response, call [phone] 07/03/2016, 10:39 AM

## 2016-07-03 NOTE — Progress Notes (Signed)
Daily Progress Note   Patient Name: Angelica Beck       Date: 07/03/2016 DOB: 19-Oct-1950  Age: 65 y.o. MRN#: [id number] Attending Physician: Angelica Males, MD Primary Care Physician: Angelica Chimes, NP Admit Date: 06/20/2016  Reason for Consultation/Follow-up: Establishing goals of care  Subjective: I met today at Angelica Beck's bedside along with daughter [name]. [name] was just speaking with Dr. Ashok Cordia. Has also spoken with another NP on our team Romona Curls the past couple days. [name] tells me, without me asking, about her wishes for her mother. She expresses frustration at continued discussion regarding DNR - "she didn't chose that for her mother and I won't chose that for her." She asks questions about trach, PEG, dialysis and needing care in a facility "in [place]" or elsewhere. I told her that I was not sure where this care could take place but the CMRN/CSW would assist with finding options. She clearly expresses, consistently, desire for full aggressive care including all mentioned above. Angelica Beck is alert, tracking, and nodding head appropriately to questions and I believe this is driving [name]'s decisions. Will continue to offer support but doubt her wishes for her mother will change anytime soon. Spoke with Dr. Lorrene Reid who endorses that this is consistent with past interactions/conversations she has had with Angelica Beck and [name].   Length of Stay: 13  Current Medications: Scheduled Meds:  . aspirin  162 mg Per Tube Daily  . darbepoetin (ARANESP) injection - DIALYSIS  100 mcg Intravenous Q Mon-HD  . feeding supplement (VITAL HIGH PROTEIN)  1,000 mL Per Tube Q24H  . insulin aspart  0-9 Units Subcutaneous Q4H  . pantoprazole sodium  40 mg Per Tube Daily    Continuous Infusions:     PRN Meds: sodium chloride, Place/Maintain arterial line **AND** sodium chloride, sodium chloride, sodium chloride, sodium chloride, sodium chloride, alteplase, fentaNYL (SUBLIMAZE) injection, lidocaine (PF), lidocaine-prilocaine, pentafluoroprop-tetrafluoroeth  Physical Exam  Constitutional: She appears well-developed and well-nourished. She is intubated.  HENT:  Head: Normocephalic and atraumatic.  Cardiovascular: Tachycardia present.   Pulmonary/Chest: No tachypnea. She is intubated. No respiratory distress.  Abdominal: Soft. Normal appearance.  Neurological: She is alert.  Nods yes/no appropriately  Nursing note and vitals reviewed.           Vital Signs: BP (!) 142/68   Pulse (!) 107  Temp (!) 100.6 F (38.1 C) (Oral) Comment: Notified RN- Teresa   Resp (!) 26   Ht '5\' 6"'  (1.676 m)   Wt 110.9 kg (244 lb 7.8 oz)   SpO2 97%   BMI 39.46 kg/m  SpO2: SpO2: 97 % O2 Device: O2 Device: Ventilator O2 Flow Rate:    Intake/output summary:  Intake/Output Summary (Last 24 hours) at 07/03/16 1402 Last data filed at 07/03/16 1000  Gross per 24 hour  Intake              805 ml  Output               75 ml  Net              730 ml   LBM: Last BM Date: 07/02/16 (flexi) Baseline Weight: Weight: 97.5 kg (215 lb) Most recent weight: Weight: 110.9 kg (244 lb 7.8 oz)       Palliative Assessment/Data:    Flowsheet Rows   Flowsheet Row Most Recent Value  Intake Tab  Referral Department  Critical care  Unit at Time of Referral  ICU  Palliative Care Primary Diagnosis  Sepsis/Infectious Disease  Date Notified  06/30/16  Palliative Care Type  New Palliative care  Reason for referral  Clarify Goals of Care  Date of Admission  06/20/16  Date first seen by Palliative Care  07/01/16  # of days Palliative referral response time  1 Day(s)  # of days IP prior to Palliative referral  10  Clinical Assessment  Palliative Performance Scale Score  20%  Pain Max last 24 hours  Not able to  report  Pain Min Last 24 hours  Not able to report  Dyspnea Max Last 24 Hours  Not able to report  Dyspnea Min Last 24 hours  Not able to report  Nausea Max Last 24 Hours  Not able to report  Nausea Min Last 24 Hours  Not able to report  Anxiety Max Last 24 Hours  Not able to report  Anxiety Min Last 24 Hours  Not able to report  Other Max Last 24 Hours  Not able to report  Psychosocial & Spiritual Assessment  Palliative Care Outcomes  Patient/Family meeting held?  Yes  Who was at the meeting?  daughter  [place] goals of care  Palliative Care follow-up planned  No      Patient Active Problem List   Diagnosis Date Noted  . Palliative care encounter   . Abdominal pain   . Pressure injury of skin 06/24/2016  . Encounter for central line placement   . Acute respiratory failure ([place]) 06/21/2016  . Septic shock ([place]) 06/20/2016  . Chronic respiratory failure with hypoxia ([place]) 11/29/2015  . Essential hypertension   . Bacterial UTI 08/06/2015  . Sepsis ([place]) 08/06/2015  . Altered mental status   . Lactic acidosis 08/02/2015  . Acute encephalopathy 08/02/2015  . History of CVA (cerebrovascular accident) 08/02/2015  . Chronic systolic HF (heart failure) ([place]) 08/02/2015  . Cognitive impairment 08/02/2015  . AKI (acute kidney injury) ([place]) 02/08/2015  . Abnormality of gait 02/08/2015  . Hyperthyroidism 02/08/2015  . Type 2 diabetes mellitus ([place]) 02/08/2015  . Chest pain 01/19/2014  . Obesity hypoventilation syndrome ([place]) 06/20/2013  . HYPERTHYROIDISM 06/28/2009  . DIAB W/UNS COMP TYPE II/UNS NOT STATED UNCNTRL 06/28/2009  . HYPERLIPIDEMIA-MIXED 06/28/2009  . OVERWEIGHT/OBESITY 06/28/2009  . HYPERTENSION, BENIGN 06/28/2009  . CAD, NATIVE VESSEL 06/28/2009  . DYSPNEA 06/28/2009  Palliative Care Assessment & Plan   HPI: 65 y.o. female  with past medical history of Stroke, diabetes, chronic kidney disease, chronic urinary tract infections  secondary to incontinence and debility, admitted on 06/20/2016 after experiencing 2 days of nausea and vomiting at home, abdominal pain. When her daughter returned home from work she found her unresponsive. Upon arrival to the emergency department she was lethargic, hypotensive with hypoxemia. Her potassium was 7.4 creatinine 6.51 lactic acid 13.88.Marland Kitchen CT of the abdomen showed a partial small bowel obstruction. She was intubated and has been on  CRRT. Her liver enzymes were also elevated with her alkaline phosphatase at 594, AST/ALT 2162/553; albumin 1.8. She has remained critically ill in ICU since admission but is slowly looking more alert. Her daughter, with whom she has lived for the past several years and who is her caregiver sees her as much improved since admission.   Assessment: Lying in bed, intubated, but alert and somewhat oriented.   Recommendations/Plan:  Full scope treatment.   Sedation per PCCM while intubated.   Goals of Care and Additional Recommendations:  Limitations on Scope of Treatment: Full Scope Treatment  Code Status:  Full code  Prognosis:   Unable to determine  Discharge Planning:  To Be Determined  Care plan was discussed with Dr. Lorrene Reid, Dr. Ashok Cordia.   Thank you for allowing the Palliative Medicine Team to assist in the care of this patient.   Time In: 1410 Time Out: 1440 Total Time 76mn Prolonged Time Billed  no       Greater than 50%  of this time was spent counseling and coordinating care related to the above assessment and plan.  AVinie Sill NP Palliative Medicine Team Pager # [phone](M-F 8a-5p) Team Phone # [phone](Nights/Weekends)

## 2016-07-04 ENCOUNTER — Ambulatory Visit: Payer: Medicare Other | Admitting: Podiatry

## 2016-07-04 LAB — CBC WITH DIFFERENTIAL/PLATELET
BASOS ABS: 0 10*3/uL (ref 0.0–0.1)
BASOS PCT: 0 %
Eosinophils Absolute: 0 10*3/uL (ref 0.0–0.7)
Eosinophils Relative: 0 %
HEMATOCRIT: 21.5 % — AB (ref 36.0–46.0)
HEMOGLOBIN: 6.9 g/dL — AB (ref 12.0–15.0)
LYMPHS ABS: 1.3 10*3/uL (ref 0.7–4.0)
LYMPHS PCT: 4 %
MCH: 27.2 pg (ref 26.0–34.0)
MCHC: 31.6 g/dL (ref 30.0–36.0)
MCV: 86 fL (ref 78.0–100.0)
MONOS PCT: 5 %
Monocytes Absolute: 1.6 10*3/uL — ABNORMAL HIGH (ref 0.1–1.0)
NEUTROS ABS: 28.6 10*3/uL — AB (ref 1.7–7.7)
Neutrophils Relative %: 91 %
Platelets: 205 10*3/uL (ref 150–400)
RBC: 2.5 MIL/uL — ABNORMAL LOW (ref 3.87–5.11)
RDW: 15 % (ref 11.5–15.5)
WBC: 31.5 10*3/uL — ABNORMAL HIGH (ref 4.0–10.5)

## 2016-07-04 LAB — SEROTONIN RELEASE ASSAY (SRA)
SRA 100IU/mL UFH Ser-aCnc: 1 % (ref 0–20)
SRA 100IU/mL UFH Ser-aCnc: <1 % (ref 0–20)
SRA, LOW DOSE HEPARIN: 1 % (ref 0–20)

## 2016-07-04 LAB — GLUCOSE, CAPILLARY
GLUCOSE-CAPILLARY: 170 mg/dL — AB (ref 65–99)
Glucose-Capillary: 228 mg/dL — ABNORMAL HIGH (ref 65–99)
Glucose-Capillary: 208 mg/dL — ABNORMAL HIGH (ref 65–99)
Glucose-Capillary: 192 mg/dL — ABNORMAL HIGH (ref 65–99)
Glucose-Capillary: 218 mg/dL — ABNORMAL HIGH (ref 65–99)

## 2016-07-04 LAB — RENAL FUNCTION PANEL
ANION GAP: 14 (ref 5–15)
Albumin: 1.4 g/dL — ABNORMAL LOW (ref 3.5–5.0)
BUN: 118 mg/dL — ABNORMAL HIGH (ref 6–20)
CHLORIDE: 95 mmol/L — AB (ref 101–111)
CO2: 24 mmol/L (ref 22–32)
Calcium: 8.8 mg/dL — ABNORMAL LOW (ref 8.9–10.3)
Creatinine, Ser: 4.15 mg/dL — ABNORMAL HIGH (ref 0.44–1.00)
GFR calc non Af Amer: 11 mL/min — ABNORMAL LOW (ref >60)
GFR, EST AFRICAN AMERICAN: 12 mL/min — AB (ref >60)
GLUCOSE: 257 mg/dL — AB (ref 65–99)
POTASSIUM: 4.4 mmol/L (ref 3.5–5.1)
Phosphorus: 2.3 mg/dL — ABNORMAL LOW (ref 2.5–4.6)
Sodium: 133 mmol/L — ABNORMAL LOW (ref 135–145)

## 2016-07-04 LAB — MAGNESIUM: Magnesium: 1.7 mg/dL (ref 1.7–2.4)

## 2016-07-04 LAB — C DIFFICILE QUICK SCREEN W PCR REFLEX
C DIFFICILE (CDIFF) INTERP: DETECTED
C DIFFICILE (CDIFF) TOXIN: POSITIVE — AB
C Diff antigen: POSITIVE — AB

## 2016-07-04 LAB — PREPARE RBC (CROSSMATCH)

## 2016-07-04 LAB — HEMOGLOBIN AND HEMATOCRIT, BLOOD
HCT: 26.2 % — ABNORMAL LOW (ref 36.0–46.0)
HEMOGLOBIN: 8.5 g/dL — AB (ref 12.0–15.0)

## 2016-07-04 MED ORDER — SODIUM CHLORIDE 0.9 % IV SOLN: Freq: Once | INTRAVENOUS | Status: AC

## 2016-07-04 MED ORDER — HEPARIN SODIUM (PORCINE) 5000 UNIT/ML IJ SOLN
5000.0000 [IU] | Freq: Three times a day (TID) | INTRAMUSCULAR | Status: DC
  Administered 2016-07-04 – 2016-07-10 (×18): 5000 [IU] via SUBCUTANEOUS
  Filled 2016-07-04 (×18): qty 1

## 2016-07-04 MED ORDER — VANCOMYCIN 50 MG/ML ORAL SOLUTION
500.0000 mg | Freq: Four times a day (QID) | ORAL | Status: DC
  Administered 2016-07-04 – 2016-07-14 (×32): 500 mg via ORAL
  Filled 2016-07-04 (×41): qty 10

## 2016-07-04 MED ORDER — INSULIN NPH (HUMAN) (ISOPHANE) 100 UNIT/ML ~~LOC~~ SUSP
10.0000 [IU] | Freq: Two times a day (BID) | SUBCUTANEOUS | Status: DC
  Administered 2016-07-04 (×2): 10 [IU] via SUBCUTANEOUS
  Filled 2016-07-04: qty 10

## 2016-07-04 NOTE — Progress Notes (Signed)
PULMONARY / CRITICAL CARE MEDICINE   Name: Angelica Beck MRN: [id number] DOB: May 02, 1951    ADMISSION DATE:  06/20/2016 CONSULTATION DATE:  06/20/2016  REFERRING MD:  EDP Dr Cathleen Fears   CHIEF COMPLAINT:  AMS  HISTORY OF PRESENT ILLNESS:   65 year old female with past medical history as below, which is significant for stroke with subsequent debilitation, diabetes, chronic kidney disease, chronic urinary tract infections due to incontinence, for which she takes chronic antibiotics. She lives at home with her daughter where she requires full assistance she is unable to truly ambulate and spends most of her time in a hospital bed/recliner. She relies on family members almost entirely for activities of daily living. She was in her usual state of health until about 9/23 when she developed nausea and vomiting. This persisted for 2 days until symptoms subsided, however, she continued to complain of abdominal pain. 9/26 when the patient's daughter got home from work the patient was found to be unresponsive. EMS was called and she was found hypoglycemic which was corrected, however, altered mental status did not improve. Upon arrival to the emergency department she continued to be lethargic and was found to be hypotensive with hypoxemia. Laboratory evaluation significant for potassium 7.4, bicarbonate less than 7, creatinine 6.51, BUN 95, WBC 12.3, lactic acid 13.88, pH 7.07.  SUBJECTIVE:   Continues to be alert. Indicates no pain by shaking head.  Was weaning on vent all yesterday and doing fine, per Respiratory  VITAL SIGNS: BP 126/61   Pulse (!) 106   Temp 99.7 F (37.6 C) (Oral)   Resp 19   Ht '5\' 6"'$  (1.676 m)   Wt 245 lb 9.5 oz (111.4 kg)   SpO2 94%   BMI 39.64 kg/m   HEMODYNAMICS: CVP:  [6 mmHg] 6 mmHg  VENTILATOR SETTINGS: Vent Mode: PRVC FiO2 (%):  [40 %] 40 % Set Rate:  [20 bmp] 20 bmp Vt Set:  [470 mL] 470 mL PEEP:  [5 cmH20] 5 cmH20 Pressure Support:  [10 cmH20] 10  cmH20 Plateau Pressure:  [16 cmH20-18 cmH20] 16 cmH20  INTAKE / OUTPUT: I/O last 3 completed shifts: In: 2735 [I.V.:345; NG/GT:2340; IV Piggyback:50] Out: -   PHYSICAL EXAMINATION: General:  Obese female. Chronically ill appearing. Neuro: Opens eyes to voice, tracks, nods, squeezes hand and wiggles toes but with decreased strength (R > L) HEENT:  No scleral icterus or injection. ETT in place. Cardiovascular:  Regular rhythm and rate. 2+ LE bilaterally to knee and puffy hands and feet. Unable to appreciate JVD. Lungs: Coarse breath sounds throughout. Symmetric chest rise on ventilator.  Abdomen:  Soft. Protuberant. + BS. NT.  Musculoskeletal:  No joint deformity or effusion. No crepitus. Skin:  Warm & dry. Sacral sloughing.   LABS:  BMET  Recent Labs Lab 07/02/16 0437 07/03/16 0430 07/04/16 0543  NA 135 135 133*  K 3.9 4.3 4.4  CL 96* 96* 95*  CO2 '28 27 24  '$ BUN 53* 87* 118*  CREATININE 2.48* 3.31* 4.15*  GLUCOSE 205* 230* 257*    Electrolytes  Recent Labs Lab 07/02/16 0437 07/03/16 0430 07/04/16 0543  CALCIUM 8.2* 8.5* 8.8*  MG 1.8 1.8 1.7  PHOS 2.7 2.0* 2.3*    CBC  Recent Labs Lab 07/02/16 0437 07/03/16 0430 07/04/16 0543  WBC 15.4* 30.3* 31.5*  HGB 7.5* 7.2* 6.9*  HCT 23.0* 22.4* 21.5*  PLT 143* 176 205    Coag's  Recent Labs Lab 06/29/16 0914  INR 1.38    Sepsis Markers No  results for input(s): LATICACIDVEN, PROCALCITON, O2SATVEN in the last 168 hours.  ABG No results for input(s): PHART, PCO2ART, PO2ART in the last 168 hours.  Liver Enzymes  Recent Labs Lab 06/30/16 0455 07/01/16 0500 07/02/16 0437 07/03/16 0430 07/04/16 0543  AST 1,412* 2,162*  --  450*  --   ALT 385* 553*  --  208*  --   ALKPHOS 452* 594*  --  480*  --   BILITOT 1.2 1.0  --  0.5  --   ALBUMIN 2.1* 1.8*  1.9* 1.7* 1.6* 1.4*    Cardiac Enzymes No results for input(s): TROPONINI, PROBNP in the last 168 hours.  Glucose  Recent Labs Lab 07/03/16 0740  07/03/16 1232 07/03/16 1605 07/03/16 2022 07/03/16 2351 07/04/16 0351  GLUCAP 200* 226* 229* 220* 234* 228*    Imaging No results found.  STUDIES:  CT Head 9/27:  No acute abnormality. Chronic small vessel ischemic changes. Chronic lacunar infarct left basal ganglia & chronic ischemia right side of pons.  CT Abd/Pelvis w/o 9/27:  Early partial SBO w/ transition point LLQ with adhesions. Left humerus soft tissue swelling & intramuscular gas. Punctate hepatic granulomas noted.  Left Arm X-ray 9/27:  Diffuse soft tissue edema.  TTE 9/27:  LV w/ EF 60-65%. Grade 1 Diastolic dysfunction. No regional wall motion abnormalities. LA & RA normal in size. RV normal in size & function. Mild AS w/o AR. Mild MR w/o MS.  ABIs 10/3: R normal. L posterior artery noncompressible; suspect calcification.  CT head 10/3: No acute infarct, no hemorrhage or mass. Atrophy evident. Similar to prior study.   MICROBIOLOGY: MRSA PCR 9/27:  Negative Blood Ctx x 2 9/26 >> Coag Neg Staph 1/2 Urine Ctx 9/26:  5000 CFU E coli & 1000 CFU Klebsiella   ANTIBIOTICS: Levaquin 9/26 - 9/28 Aztreonam 9/26 - 9/27 Vancomycin 9/26 >> off Merrem 9/27 >> 10/3  SIGNIFICANT EVENTS: 9/26 - admit for presumed septic shock  LINES/TUBES: OETT 7.5 9/26 >> L IJ CVL 9/26 >> Foley 9/26 >> 10/8 RIJ HD 9/30 >> Rectal tube 10/6 >>  DISCUSSION: Angelica Beck is a 65-y/o female with T2DM, CKD, recurrent UTIs 2/2 incontinence, and stroke with subsequent severe debilitation (Left sided weakness; ECOG 4), who presented with AMS and found to be in shock, likely 2/2 UTI sepsis. Some improvement in level of alertness with CRRT. Completed 7-day course of meropenem 10/3. Has not been making urine after CRRT so received iHD 10/5. Making slow improvements in level of alertness but unclear that she can successfully extubate.  Concern remains for ability to regain former function given reliance on hemodialysis and continued severe weakness.  Suspect that we will need to consider extubation when we believe she is optimized, acknowledge that if she fails we would make her comfortable. Scenario complicated by the fact that hepatic and renal injuries have not resolved either. Daughter wanting aggressive care. To pursue continued HD through perm cath once leukocytosis improves.   ASSESSMENT / PLAN:  PULMONARY A: Acute Hypoxic Respiratory Failure - Unable to protect airway with acute encephalopathy.  Obesity hypoventilation syndrome VDRF P:   SBT as tolerated. Possible extubation 10/10 as patient more alert today; Dr. Ashok Cordia discussed with daughter possibility of needing tracheostomy if she fails, and daughter agreed Oral care for PNA prophylaxis  CXR intermittently.  CARDIOVASCULAR A:  Shock - Likely sepsis. Cortisol 45.1. Resolved. H/O HTN & HLD P:  Monitor hemodynamics.  RENAL A:   Acute On Chronic Renal Failure Stage IV  -  worsens without HD treatments Profound Metabolic Acidosis  Hyperkalemia - Resolved.  Lactic Acidosis, suspect due to hypoperfusion with possible contribution of renal injury 2/2 metformin, persisting likely due to poor renal clearance.  Resolved 10/2 after CRRT. Hyperphosphatemia - Secondary to acute renal failure. Resolved. Pseudohypocalcemia - Improving.  P:   To pursue continued HD Consult VVS about perm cath placement once leukocytosis resolved  GASTROINTESTINAL A:   Partial SBO - Resolved, having BMs. H/O Ventral Hernia Repair. Possible Liver Cirrhosis. RUQ U/S normal. Ammonia WNL 10/5.  Transaminitis improving, suspect shock liver from poor renal clearance.  P:   Continue tube feeds. Protonix per tube CMP in AM (obtaining every other day)  HEMATOLOGIC A:   Anemia - Chronic. Oozing wounds around mouth. Hgb decreasing slowly after transfusion (6.9 on 10/10) Leukocytosis - Initially thought to be due to sepsis. Now increasing but not spiking regular fevers -- had isolated fever to 100.50F  yesterday. HIT ab inconclusive. SRA negative.  P:  SCD's / aspirin. Plavix held SQ heparin TID S/p 1 u transfusion 10/6 Repeat transfusion 1 u 10/10 Daily CBCs  Transfusion goal of 7.0.  ESA per renal  INFECTIOUS A:   Severe Sepsis - due to E.coli and Klebsiella UTI. Unlikely Coag Neg Staph Bacteremia, suspect contaminant Leukocytosis rising again Sacral wound P:   Obtain C. diff PCR Completed course of meropenem Following cultures to completion. Holding home Trimethoprim. Wound care consulted, appreciate recommendations of using foam dressings Consider CXR  ENDOCRINE A:   Hypoglycemia - Resolved with Dextrose IVF. H/O DM Type 2 - A1c 5.8. H/O Hyperthyroidism - On PTU at home. TSH 0.331 & F T4 1.33 9/26. Repeat TSH 2.044 10/3. Now TSH 0.554 and F T4 1.30.  CBCs in low 200s consistently P:   Start BID NPH Holding PTU & Metformin. Consider starting methimazole instead if TSH decreases further, as PTU can cause hepatotoxicity. Recheck TSH weekly  NEUROLOGIC A:   Acute Encephalopathy - Question hepatic vs toxic metabolic vs hypoxia. No improvement in Lactulose Enema despite improved Ammonia.  Favor toxic metabolic as is gradually improving with CRRT. Repeat CT head unchanged. TSH WNL. Folate WNL. H/O CVA - Minimal mobility. P:   RASS goal: 0 to -1 Avoid sedating meds.  MUSCULOSKELETAL A: Edema Left Arm - Improved. Secondary to IV infiltration. No gas on X-ray 9/27. P: Continue to monitor   FAMILY  - Updates: None available 10/9. Daughter continues to want as aggressive treatment as possible at this time, given small daily improvements.   - Inter-disciplinary family meet or Palliative Care meeting due by: Performed 10/6, 10/7, to continue.   Olene Floss, MD  [place], PGY-2

## 2016-07-04 NOTE — Progress Notes (Signed)
Nutrition Follow-up  DOCUMENTATION CODES:   Obesity unspecified  INTERVENTION:    Continue Vital High Protein at 60 ml/h (1440 ml/day) to provide 1440 kcals, 126 gm protein, 1204 ml free water daily  NUTRITION DIAGNOSIS:   Inadequate oral intake related to inability to eat as evidenced by NPO status.  Ongoing  GOAL:   Provide needs based on ASPEN/SCCM guidelines  Met  MONITOR:   Vent status, Labs, TF tolerance, Skin, I & O's  ASSESSMENT:   65-y/o female with T2DM, CKD, recurrent UTIs 2/2 incontinence, and stroke with subsequent severe debilitation (ECOG 4), who presented with AMS and found to be in shock, likely 2/2 UTI sepsis.   Discussed patient in ICU rounds and with RN today.  May require trach. Now receiving intermittent HD. C diff positive. Patient is currently receiving Vital High Protein via OGT at 60 ml/h (1440 ml/day) to provide 1440 kcals, 126 gm protein, 1204 ml free water daily.  Patient is currently intubated on ventilator support Temp (24hrs), Avg:99.4 F (37.4 C), Min:98.5 F (36.9 C), Max:100.2 F (37.9 C)   Diet Order:  Diet NPO time specified  Skin:  Wound (see comment) (stage II pressure injury to lip)  Last BM:  9/28  Height:   Ht Readings from Last 1 Encounters:  06/20/16 _0  (1.676 m)    Weight:   Wt Readings from Last 1 Encounters:  07/04/16 245 lb 9.5 oz (111.4 kg)    Ideal Body Weight:  59.1 kg  BMI:  Body mass index is 39.64 kg/m.  Estimated Nutritional Needs:   Kcal:  2458-0998  Protein:  >/= 118 gm  Fluid:  2 L  EDUCATION NEEDS:   No education needs identified at this time  Molli Barrows, [place], [place], [place] Pager [phone] After Hours Pager [phone]

## 2016-07-04 NOTE — Care Management Note (Signed)
Case Management Note  Patient Details  Name: Angelica Beck MRN: [id number] Date of Birth: 27-Sep-1950  Subjective/Objective:   Pt admitted with  - pt is now on ventilator   Action/Plan:   She lives at home with her daughter where she requires full assistance, she is unable to truly ambulate and spends most of her time in a hospital bed/recliner. She relies on family members almost entirely for activities of daily living.  Pt may benefit from PT/OT eval for possible placement if family is unable to resume care at home.  CM will continue to follow pt for discharge needs   Expected Discharge Date:                  Expected Discharge Plan:  Long Term Acute Care (LTAC)  In-House Referral:  NA  Discharge planning Services  CM Consult  Post Acute Care Choice:    Choice offered to:     DME Arranged:    DME Agency:     HH Arranged:    HH Agency:     Status of Service:  In process, will continue to follow  If discussed at Long Length of Stay Meetings, dates discussed:    Additional Comments: 07/04/2016 CM covering unit 10/9 - spoke in detail with Physician Advisor and it was determined that pt is appropriate for LTACH.  CM spoke with attending in detail today and he also agrees with [facility] referral - attending has already been in discussions with pt and daughter regarding post acute facility with current needs (vent/trach and HD).  Referral given to both LTACHs 07/03/16 - both [facility] and [facility] have offered beds.  CM contacted daughter [name] and informed her or the discharge recommendation and choice between [facility] and [facility] [employer] [facility]- daughter is in agreement with possible discharge to [facility] and informal meeting with both options today ([facility] at [employer] and [facility] at [employer]) at pts bedside.  Pt is currently intubated with plans for tentative extubation/trach - still requiring IHD, needing perm HD cath placed however WBC trending up and concern for C Diff.   CM will continue to monitor for discharge  needs Maryclare Labrador, RN 07/04/2016, 10:32 AM

## 2016-07-04 NOTE — Care Management Important Message (Signed)
Important Message  Patient Details  Name: Angelica Beck MRN: [id number] Date of Birth: December 17, 1950   Medicare Important Message Given:  Other (see comment)    [place], Elyn Krogh Ab[place] 07/04/2016, 9:01 AM

## 2016-07-04 NOTE — Progress Notes (Signed)
S: intubated.  Seen on HD   BFR 300, AP 70 Vp 70  Lines reversed due to high art pressure O:BP (!) 131/57   Pulse 97   Temp 99.7 F (37.6 C) (Oral)   Resp (!) 0   Ht _0  (1.676 m)   Wt 111.4 kg (245 lb 9.5 oz)   SpO2 100%   BMI 39.64 kg/m   Intake/Output Summary (Last 24 hours) at 07/04/16 0740 Last data filed at 07/04/16 0600  Gross per 24 hour  Intake             1805 ml  Output                0 ml  Net             1805 ml   Weight change: 0.5 kg (1 lb 1.6 oz) DVV:OHYWVPXTG but alert CVS:RRR Resp:clear ant Abd: +BS NTND Ext: tr-1+ edema NEURO: eyes opened, responds to some ?, unable to move extremeties Rt IJ HD cath Lt IJ triple lumen   . sodium chloride   Intravenous Once  . sodium chloride   Intravenous Once  . aspirin  162 mg Per Tube Daily  . darbepoetin (ARANESP) injection - DIALYSIS  100 mcg Intravenous Q Mon-HD  . feeding supplement (VITAL HIGH PROTEIN)  1,000 mL Per Tube Q24H  . insulin aspart  0-9 Units Subcutaneous Q4H  . pantoprazole sodium  40 mg Per Tube Daily   No results found. BMET    Component Value Date/Time   NA 133 (L) 07/04/2016 0543   K 4.4 07/04/2016 0543   CL 95 (L) 07/04/2016 0543   CO2 24 07/04/2016 0543   GLUCOSE 257 (H) 07/04/2016 0543   BUN 118 (H) 07/04/2016 0543   CREATININE 4.15 (H) 07/04/2016 0543   CALCIUM 8.8 (L) 07/04/2016 0543   GFRNONAA 11 (L) 07/04/2016 0543   GFRAA 12 (L) 07/04/2016 0543   CBC    Component Value Date/Time   WBC 31.5 (H) 07/04/2016 0543   RBC 2.50 (L) 07/04/2016 0543   HGB 6.9 (LL) 07/04/2016 0543   HCT 21.5 (L) 07/04/2016 0543   HCT 26.8 (L) 06/27/2016 1536   PLT 205 07/04/2016 0543   MCV 86.0 07/04/2016 0543   MCH 27.2 07/04/2016 0543   MCHC 31.6 07/04/2016 0543   RDW 15.0 07/04/2016 0543   LYMPHSABS 1.3 07/04/2016 0543   MONOABS 1.6 (H) 07/04/2016 0543   EOSABS 0.0 07/04/2016 0543   BASOSABS 0.0 07/04/2016 0543     Assessment:  1. Acute on CKD 3 secondary to ATN from  shock/sepsis 2. VDRF 3 Anemia on aranesp 4. ? HIT, Plt better  Plan: 1. HD today.  Dr Lorrene Reid met with daughter and plans are to cont HD.  Hopefully renal fx will recover, if not she will most likely need LTAC. 2. Asked nurse to do bladder scan. 3. Spoke with ICU resident about asking VVS to place perm cath.    Yoshiko Keleher T

## 2016-07-04 NOTE — Progress Notes (Signed)
PULMONARY / CRITICAL CARE MEDICINE   Name: Angelica Beck MRN: [id number] DOB: [birth year]    ADMISSION DATE:  06/20/2016 CONSULTATION DATE:  06/20/2016  REFERRING MD:  EDP Dr Cathleen Fears   CHIEF COMPLAINT:  AMS  HISTORY OF PRESENT ILLNESS:   65 year old female with past medical history as below, which is significant for stroke with subsequent debilitation, diabetes, chronic kidney disease, chronic urinary tract infections due to incontinence, for which she takes chronic antibiotics. She lives at home with her daughter where she requires full assistance she is unable to truly ambulate and spends most of her time in a hospital bed/recliner. She relies on family members almost entirely for activities of daily living. She was in her usual state of health until about 9/23 when she developed nausea and vomiting. This persisted for 2 days until symptoms subsided, however, she continued to complain of abdominal pain. 9/26 when the patient's daughter got home from work the patient was found to be unresponsive. EMS was called and she was found hypoglycemic which was corrected, however, altered mental status did not improve. Upon arrival to the emergency department she continued to be lethargic and was found to be hypotensive with hypoxemia. Laboratory evaluation significant for potassium 7.4, bicarbonate less than 7, creatinine 6.51, BUN 95, WBC 12.3, lactic acid 13.88, pH 7.07.  SUBJECTIVE:  No acute events overnight. Nods no to any difficulty breathing or pain.   REVIEW OF SYSTEMS:  Unable to obtain with intubation.   VITAL SIGNS: BP 108/67   Pulse (!) 106   Temp 99.3 F (37.4 C) (Oral)   Resp (!) 24   Ht '5\' 6"'$  (1.676 m)   Wt 245 lb 9.5 oz (111.4 kg)   SpO2 99%   BMI 39.64 kg/m   HEMODYNAMICS: CVP:  [6 mmHg] 6 mmHg  VENTILATOR SETTINGS: Vent Mode: PSV;CPAP FiO2 (%):  [40 %] 40 % Set Rate:  [20 bmp] 20 bmp Vt Set:  [470 mL] 470 mL PEEP:  [5 cmH20] 5 cmH20 Pressure Support:  [10 cmH20-15  cmH20] 15 cmH20 Plateau Pressure:  [16 cmH20-18 cmH20] 16 cmH20  INTAKE / OUTPUT: I/O last 3 completed shifts: In: 2735 [I.V.:345; NG/GT:2340; IV Piggyback:50] Out: -   PHYSICAL EXAMINATION: General:  Obese female. No distress. No family at bedside. Undergoing HD today. Neuro: Nods appropriately. Tracks to voice. Wiggles right toes and grips right hand on command. HEENT:  No scleral icterus. ETT in place. Cardiovascular:  Regular rhythm and rate. Pitting lower extremity edema persists. Lungs:  Coarse bilaterally.  Symmetric chest wall rise on ventilator.  Abdomen:  Soft. Protuberant. Hyperactive bowel sounds. Musculoskeletal:  No joint deformity or effusion.  Skin:  Warm & dry. Sacral sloughing.   LABS:  BMET  Recent Labs Lab 07/02/16 0437 07/03/16 0430 07/04/16 0543  NA 135 135 133*  K 3.9 4.3 4.4  CL 96* 96* 95*  CO2 '28 27 24  '$ BUN 53* 87* 118*  CREATININE 2.48* 3.31* 4.15*  GLUCOSE 205* 230* 257*    Electrolytes  Recent Labs Lab 07/02/16 0437 07/03/16 0430 07/04/16 0543  CALCIUM 8.2* 8.5* 8.8*  MG 1.8 1.8 1.7  PHOS 2.7 2.0* 2.3*    CBC  Recent Labs Lab 07/02/16 0437 07/03/16 0430 07/04/16 0543  WBC 15.4* 30.3* 31.5*  HGB 7.5* 7.2* 6.9*  HCT 23.0* 22.4* 21.5*  PLT 143* 176 205    Coag's  Recent Labs Lab 06/29/16 0914  INR 1.38    Sepsis Markers No results for input(s): LATICACIDVEN, PROCALCITON, O2SATVEN in  the last 168 hours.  ABG No results for input(s): PHART, PCO2ART, PO2ART in the last 168 hours.  Liver Enzymes  Recent Labs Lab 06/30/16 0455 07/01/16 0500 07/02/16 0437 07/03/16 0430 07/04/16 0543  AST 1,412* 2,162*  --  450*  --   ALT 385* 553*  --  208*  --   ALKPHOS 452* 594*  --  480*  --   BILITOT 1.2 1.0  --  0.5  --   ALBUMIN 2.1* 1.8*  1.9* 1.7* 1.6* 1.4*    Cardiac Enzymes No results for input(s): TROPONINI, PROBNP in the last 168 hours.  Glucose  Recent Labs Lab 07/03/16 1232 07/03/16 1605  07/03/16 2022 07/03/16 2351 07/04/16 0351 07/04/16 0803  GLUCAP 226* 229* 220* 234* 228* 208*    Imaging No results found.  STUDIES:  CT Head 9/27:  No acute abnormality. Chronic small vessel ischemic changes. Chronic lacunar infarct left basal ganglia & chronic ischemia right side of pons.  CT Abd/Pelvis w/o 9/27:  Early partial SBO w/ transition point LLQ with adhesions. Left humerus soft tissue swelling & intramuscular gas. Punctate hepatic granulomas noted.  Left Arm X-ray 9/27:  Diffuse soft tissue edema.  TTE 9/27:  LV w/ EF 60-65%. Grade 1 Diastolic dysfunction. No regional wall motion abnormalities. LA & RA normal in size. RV normal in size & function. Mild AS w/o AR. Mild MR w/o MS.  ABIs 10/3: R normal. L posterior artery noncompressible; suspect calcification.  CT head 10/3: No acute infarct, no hemorrhage or mass. Atrophy evident. Similar to prior study.   MICROBIOLOGY: MRSA PCR 9/27:  Negative Blood Ctx x 2 9/26 >> Coag Neg Staph 1/2 Urine Ctx 9/26:  5000 CFU E coli & 1000 CFU Klebsiella  C diff PCR 10/10 >>  ANTIBIOTICS: Levaquin 9/26 - 9/28 Aztreonam 9/26 - 9/27 Vancomycin 9/26 - off Merrem 9/27 - 10/3  SIGNIFICANT EVENTS: 9/26 - admit for presumed septic shock  LINES/TUBES: OETT 7.5 9/26 >> L IJ CVL 9/26 >> Foley 9/26 >> RIJ HD 9/30 >> Rectal tube 10/6 >>  ASSESSMENT / PLAN:  PULMONARY A: Acute Hypoxic Respiratory Failure - Unable to protect airway with acute encephalopathy.  OHS  P:   SBT & PS wean as tolerated  Full Vent Support for rest Tracheostomy if not able to extubate  CARDIOVASCULAR A:  Shock - Likely sepsis. Cortisol 45.1. Resolved. H/O HTN & HLD  P:  Vitals per unit protocol Continuous telemetry monitoring  RENAL A:   Acute On Chronic Renal Failure Stage IV  - worsens without HD treatments Profound Metabolic Acidosis - Resolved w/ HD. Hyperkalemia - Resolved.  Lactic Acidosis - Resolved. Likely due to hypoperfusion in  setting of Metformin.   Hyperphosphatemia - Secondary to acute renal failure. Resolved. Pseudohypocalcemia - Improving.   P:   Monitoring UOP with Foley Not a good long-term candidate for HD per Nephrology Appreciate Nephrology recommendations Trending electrolytes daily Replacing electrolytes as indicated  GASTROINTESTINAL A:   Partial SBO - Resolving on XR abd 9/30. Having BMs. H/O Ventral Hernia Repair. Possible Liver Cirrhosis. RUQ U/S normal. Ammonia WNL 10/5.  Transaminitis - Likely shock liver versus congestive hepatopathy. Improving.  Diarrhea - C diff vs tube feedings.  P:   NPO Continuing Tube Feedings Protonix VT daily Trending LFTs every other day  HEMATOLOGIC A:   Anemia - Chronic. Oozing wounds around mouth. S/P 1u on 10/6. Hgb worsening. Leukocytosis - Possible sepsis. Worsening.  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 Unlikely with neg SRA.  P:  Trending cell counts daily w/ CBC Transfusion goal of 7.0 Transfusing 1u PRBC SCDs Heparin Linda q8hr  INFECTIOUS A:   Severe Sepsis  E.coli and Klebsiella UTI. Sacral wound  P:   S/P Merrem for 7 days Holding home Trimethoprim. Wound care consulted Plan to re-culture for fever  Checking Stool C diff  ENDOCRINE A:   Hypoglycemia - Resolved with Dextrose IVF. H/O DM Type 2 - A1c 5.8. H/O Hyperthyroidism - On PTU at home. TSH 0.331 & F T4 1.33 9/26. Repeat TSH 2.044 10/3.   P:   SSI per Sensitive Algorithm. Starting NPH 10u q12hr  Holding PTU & Metformin.  NEUROLOGIC A:   Acute Encephalopathy - Question hepatic vs toxic metabolic vs hypoxia. No improvement in Lactulose Enema despite improved Ammonia.  Favor toxic metabolic as is gradually improving with CRRT. Repeat CT head unchanged. TSH WNL. Folate WNL. H/O CVA - Minimal mobility.  P:   RASS goal: 0 to -1 Avoid sedating meds Fentanyl IV prn pain/discomfort  MUSCULOSKELETAL A: Edema Left Arm - Improved. Secondary to IV infiltration. No gas on X-ray  9/27.  P: Continue to monitor   FAMILY  - Updates:  Daughter updated at bedside 10/9 by Dr. Ashok Cordia. Indicates she wishes to continue with hemodialysis and tracheostomy if necessary.   - Inter-disciplinary family meet or Palliative Care meeting due by: Continued GOC discussion 10/6 with daughter [name].   TODAY'S SUMMAR:  65 y.o. female with T2DM, CKD, recurrent UTIs 2/2 incontinence, and stroke with subsequent severe debilitation (Left sided weakness; ECOG 4), who presented with AMS and found to be in shock, likely 2/2 UTI sepsis. Some improvement in level of alertness with CRRT. Completed 7-day course of meropenem 10/3. Ultimately will need LTAC for rehab. Plan for SBT post HD today. Likely will need tracheostomy later this week if no improvement.  I have spent a total of 32 minutes of critical care time today caring for the patient and reviewing the patient's electronic medical record.   Sonia Baller Ashok Cordia, M.D. [facility] Pulmonary & Critical Care Pager:  [phone] After 3pm or if no response, call [phone] 07/04/2016, 10:03 AM

## 2016-07-04 NOTE — Progress Notes (Signed)
[facility] Progress Note Patient Name: Angelica Beck DOB: [birth year] MRN: [id number]   Date of Service  07/04/2016  HPI/Events of Note  Anemia - Hgb = 6.9.  eICU Interventions  Will transfuse 1 unit PRBC now.      Intervention Category Intermediate Interventions: Other:  Jorden Minchey Cornelia Copa 07/04/2016, 7:02 AM

## 2016-07-05 DIAGNOSIS — A0472 Enterocolitis due to Clostridium difficile, not specified as recurrent: Secondary | ICD-10-CM

## 2016-07-05 LAB — GLUCOSE, CAPILLARY
GLUCOSE-CAPILLARY: 176 mg/dL — AB (ref 65–99)
GLUCOSE-CAPILLARY: 202 mg/dL — AB (ref 65–99)
GLUCOSE-CAPILLARY: 228 mg/dL — AB (ref 65–99)
Glucose-Capillary: 230 mg/dL — ABNORMAL HIGH (ref 65–99)
Glucose-Capillary: 227 mg/dL — ABNORMAL HIGH (ref 65–99)
Glucose-Capillary: 248 mg/dL — ABNORMAL HIGH (ref 65–99)
Glucose-Capillary: 174 mg/dL — ABNORMAL HIGH (ref 65–99)

## 2016-07-05 LAB — TYPE AND SCREEN
ABO/RH(D): O POS
Antibody Screen: NEGATIVE
UNIT DIVISION: 0

## 2016-07-05 LAB — CBC WITH DIFFERENTIAL/PLATELET
BASOS PCT: 0 %
Basophils Absolute: 0 10*3/uL (ref 0.0–0.1)
EOS ABS: 0 10*3/uL (ref 0.0–0.7)
EOS PCT: 0 %
HEMATOCRIT: 24.4 % — AB (ref 36.0–46.0)
HEMOGLOBIN: 8.1 g/dL — AB (ref 12.0–15.0)
LYMPHS PCT: 3 %
Lymphs Abs: 0.9 10*3/uL (ref 0.7–4.0)
MCH: 28.4 pg (ref 26.0–34.0)
MCHC: 33.2 g/dL (ref 30.0–36.0)
MCV: 85.6 fL (ref 78.0–100.0)
Monocytes Absolute: 1.6 10*3/uL — ABNORMAL HIGH (ref 0.1–1.0)
Monocytes Relative: 5 %
NEUTROS ABS: 28.6 10*3/uL — AB (ref 1.7–7.7)
NEUTROS PCT: 92 %
Platelets: 236 10*3/uL (ref 150–400)
RBC: 2.85 MIL/uL — ABNORMAL LOW (ref 3.87–5.11)
RDW: 15.1 % (ref 11.5–15.5)
WBC: 31.1 10*3/uL — ABNORMAL HIGH (ref 4.0–10.5)

## 2016-07-05 LAB — COMPREHENSIVE METABOLIC PANEL
ALK PHOS: 360 U/L — AB (ref 38–126)
ALT: 99 U/L — AB (ref 14–54)
AST: 145 U/L — AB (ref 15–41)
Albumin: 1.5 g/dL — ABNORMAL LOW (ref 3.5–5.0)
Anion gap: 12 (ref 5–15)
BILIRUBIN TOTAL: 0.7 mg/dL (ref 0.3–1.2)
BUN: 92 mg/dL — AB (ref 6–20)
CALCIUM: 8.9 mg/dL (ref 8.9–10.3)
CHLORIDE: 98 mmol/L — AB (ref 101–111)
CO2: 25 mmol/L (ref 22–32)
CREATININE: 3.46 mg/dL — AB (ref 0.44–1.00)
GFR, EST AFRICAN AMERICAN: 15 mL/min — AB (ref >60)
GFR, EST NON AFRICAN AMERICAN: 13 mL/min — AB (ref >60)
Glucose, Bld: 231 mg/dL — ABNORMAL HIGH (ref 65–99)
Potassium: 3.8 mmol/L (ref 3.5–5.1)
Sodium: 135 mmol/L (ref 135–145)
TOTAL PROTEIN: 5.7 g/dL — AB (ref 6.5–8.1)

## 2016-07-05 LAB — MAGNESIUM: Magnesium: 1.6 mg/dL — ABNORMAL LOW (ref 1.7–2.4)

## 2016-07-05 LAB — PHOSPHORUS: PHOSPHORUS: 2.6 mg/dL (ref 2.5–4.6)

## 2016-07-05 MED ORDER — ORAL CARE MOUTH RINSE
15.0000 mL | Freq: Four times a day (QID) | OROMUCOSAL | Status: DC
  Administered 2016-07-05 – 2016-07-18 (×52): 15 mL via OROMUCOSAL

## 2016-07-05 MED ORDER — INSULIN NPH (HUMAN) (ISOPHANE) 100 UNIT/ML ~~LOC~~ SUSP
8.0000 [IU] | Freq: Once | SUBCUTANEOUS | Status: AC
  Administered 2016-07-05: 8 [IU] via SUBCUTANEOUS
  Filled 2016-07-05: qty 10

## 2016-07-05 MED ORDER — INSULIN NPH (HUMAN) (ISOPHANE) 100 UNIT/ML ~~LOC~~ SUSP
12.0000 [IU] | Freq: Two times a day (BID) | SUBCUTANEOUS | Status: DC
  Administered 2016-07-05: 12 [IU] via SUBCUTANEOUS

## 2016-07-05 MED ORDER — MAGNESIUM SULFATE 2 GM/50ML IV SOLN
2.0000 g | Freq: Once | INTRAVENOUS | Status: AC
  Administered 2016-07-05: 2 g via INTRAVENOUS
  Filled 2016-07-05: qty 50

## 2016-07-05 MED ORDER — FAMOTIDINE 20 MG PO TABS
20.0000 mg | ORAL_TABLET | Freq: Every day | ORAL | Status: DC
  Administered 2016-07-05 – 2016-07-18 (×12): 20 mg
  Filled 2016-07-05 (×14): qty 1

## 2016-07-05 MED ORDER — INSULIN NPH (HUMAN) (ISOPHANE) 100 UNIT/ML ~~LOC~~ SUSP
20.0000 [IU] | Freq: Two times a day (BID) | SUBCUTANEOUS | Status: DC
Start: 2016-07-05 — End: 2016-07-06
  Administered 2016-07-05: 20 [IU] via SUBCUTANEOUS

## 2016-07-05 MED ORDER — CHLORHEXIDINE GLUCONATE 0.12% ORAL RINSE (MEDLINE KIT)
15.0000 mL | Freq: Two times a day (BID) | OROMUCOSAL | Status: DC
  Administered 2016-07-05 – 2016-07-10 (×10): 15 mL via OROMUCOSAL

## 2016-07-05 NOTE — Progress Notes (Signed)
Increased RR vent alarming, mild increased WOB noted.  Increased ps to 5- RN aware.

## 2016-07-05 NOTE — Care Management Note (Signed)
Case Management Note  Patient Details  Name: Angelica Beck MRN: [id number] Date of Birth: Oct 03, 1950  Subjective/Objective:   Pt admitted with  - pt is now on ventilator   Action/Plan:   She lives at home with her daughter where she requires full assistance, she is unable to truly ambulate and spends most of her time in a hospital bed/recliner. She relies on family members almost entirely for activities of daily living.  Pt may benefit from PT/OT eval for possible placement if family is unable to resume care at home.  CM will continue to follow pt for discharge needs   Expected Discharge Date:                  Expected Discharge Plan:  Long Term Acute Care (LTAC)  In-House Referral:  NA  Discharge planning Services  CM Consult  Post Acute Care Choice:    Choice offered to:     DME Arranged:    DME Agency:     HH Arranged:    HH Agency:     Status of Service:  In process, will continue to follow  If discussed at Long Length of Stay Meetings, dates discussed:    Additional Comments: 07/05/2016  Pts daughter met/toured both facilities yesterday.  CM contacted daughter this am - collective decision has been made to discharge to [facility] when medically ready.  Both facilities have been made aware of choice.  Pt remains on ventilator - with slow improvement of mental status  07/04/16 CM covering unit 10/9 - spoke in detail with Physician Advisor and it was determined that pt is appropriate for LTACH.  CM spoke with attending in detail today and he also agrees with [facility] referral - attending has already been in discussions with pt and daughter regarding post acute facility with current needs (vent/trach and HD).  Referral given to both LTACHs 07/03/16 - both [facility] and [facility] have offered beds.  CM contacted daughter [name] and informed her or the discharge recommendation and choice between [facility] and [facility]- daughter is in agreement with possible discharge to [facility] and informal meeting  with both options today ([facility] at [employer] and [facility] at [employer]) at pts bedside.  Pt is currently intubated with plans for tentative extubation/trach - still requiring IHD, needing perm HD cath placed however WBC trending up and concern for C Diff.   CM will continue to monitor for discharge needs Maryclare Labrador, RN 07/05/2016, 9:41 AM

## 2016-07-05 NOTE — Progress Notes (Signed)
Inpatient Diabetes Program Recommendations  AACE/ADA: New Consensus Statement on Inpatient Glycemic Control (2015)  Target Ranges:  Prepandial:   less than 140 mg/dL      Peak postprandial:   less than 180 mg/dL (1-2 hours)      Critically ill patients:  140 - 180 mg/dL   Lab Results  Component Value Date   GLUCAP 227 (H) 07/05/2016   HGBA1C 5.8 (H) 06/21/2016    Review of Glycemic Control Results for DIA, DONATE (MRN [id number]) as of 07/05/2016 10:11  Ref. Range 07/04/2016 08:03 07/04/2016 11:26 07/04/2016 15:57 07/04/2016 20:11 07/05/2016 00:04 07/05/2016 03:52 07/05/2016 07:55  Glucose-Capillary Latest Ref Range: 65 - 99 mg/dL 208 (H) 170 (H) 192 (H) 218 (H) 248 (H) 230 (H) 227 (H)   Inpatient Diabetes Program Recommendations:    Noted CBGs elevated. Please consider increasing Novolog correction to moderate 0-15 units q 4 hrs.  Thank you, Nani Gasser. Letty Salvi, RN, MSN, CDE Inpatient Glycemic Control Team Team Pager [phone] (8am-5pm) 07/05/2016 10:12 AM

## 2016-07-05 NOTE — Progress Notes (Addendum)
PULMONARY / CRITICAL CARE MEDICINE   Name: Angelica Beck MRN: [id number] DOB: 26-Jun-1951    ADMISSION DATE:  06/20/2016 CONSULTATION DATE:  06/20/2016  REFERRING MD:  EDP Dr Cathleen Fears   CHIEF COMPLAINT:  AMS  HISTORY OF PRESENT ILLNESS:   65 year old female with past medical history as below, which is significant for stroke with subsequent debilitation, diabetes, chronic kidney disease, chronic urinary tract infections due to incontinence, for which she takes chronic antibiotics. She lives at home with her daughter where she requires full assistance she is unable to truly ambulate and spends most of her time in a hospital bed/recliner. She relies on family members almost entirely for activities of daily living. She was in her usual state of health until about 9/23 when she developed nausea and vomiting. This persisted for 2 days until symptoms subsided, however, she continued to complain of abdominal pain. 9/26 when the patient's daughter got home from work the patient was found to be unresponsive. EMS was called and she was found hypoglycemic which was corrected, however, altered mental status did not improve. Upon arrival to the emergency department she continued to be lethargic and was found to be hypotensive with hypoxemia. Laboratory evaluation significant for potassium 7.4, bicarbonate less than 7, creatinine 6.51, BUN 95, WBC 12.3, lactic acid 13.88, pH 7.07.  SUBJECTIVE:  No acute events overnight. Started on PO vancomycin 10/10 for C diff. Patient nods no to any pain or difficulty breathing.   REVIEW OF SYSTEMS:  Unable to obtain with intubation.   VITAL SIGNS: BP 140/76   Pulse (!) 125   Temp 100.3 F (37.9 C) (Oral)   Resp (!) 21   Ht '5\' 6"'$  (1.676 m)   Wt 239 lb 13.8 oz (108.8 kg)   SpO2 98%   BMI 38.71 kg/m   HEMODYNAMICS:    VENTILATOR SETTINGS: Vent Mode: PSV;CPAP FiO2 (%):  [40 %] 40 % Set Rate:  [14 bmp] 14 bmp Vt Set:  [640 mL] 640 mL PEEP:  [5 cmH20] 5  cmH20 Pressure Support:  [0 cmH20-12 cmH20] 0 cmH20 Plateau Pressure:  [10 cmH20-12 cmH20] 12 cmH20  INTAKE / OUTPUT: I/O last 3 completed shifts: In: 2945 [I.V.:360; Blood:335; NG/GT:2250] Out: 2633 [Other:3005; Stool:550]  PHYSICAL EXAMINATION: General:  Obese female. No distress. No family at bedside. Neuro: Nods appropriately. Tracks to voice. Wiggles right toes and grips right hand on command. HEENT:  No scleral icterus or injection. ETT in place. Cardiovascular:  Regular rhythm and rate. Ansarca persists. Lungs:  Improving bilateral aeration & clear breath sounds.  Symmetric chest wall rise on ventilator. Moderately increased work of breathing on PS 0/5 FiO2 0.4.  Abdomen:  Soft. Protuberant. Hyperactive bowel sounds. Musculoskeletal:  No joint deformity or effusion.  Skin:  Warm & dry. Sacral sloughing.   LABS:  BMET  Recent Labs Lab 07/03/16 0430 07/04/16 0543 07/05/16 0345  NA 135 133* 135  K 4.3 4.4 3.8  CL 96* 95* 98*  CO2 '27 24 25  '$ BUN 87* 118* 92*  CREATININE 3.31* 4.15* 3.46*  GLUCOSE 230* 257* 231*    Electrolytes  Recent Labs Lab 07/03/16 0430 07/04/16 0543 07/05/16 0345  CALCIUM 8.5* 8.8* 8.9  MG 1.8 1.7 1.6*  PHOS 2.0* 2.3* 2.6    CBC  Recent Labs Lab 07/03/16 0430 07/04/16 0543 07/04/16 1157 07/05/16 0345  WBC 30.3* 31.5*  --  31.1*  HGB 7.2* 6.9* 8.5* 8.1*  HCT 22.4* 21.5* 26.2* 24.4*  PLT 176 205  --  236  Coag's  Recent Labs Lab 06/29/16 0914  INR 1.38    Sepsis Markers No results for input(s): LATICACIDVEN, PROCALCITON, O2SATVEN in the last 168 hours.  ABG No results for input(s): PHART, PCO2ART, PO2ART in the last 168 hours.  Liver Enzymes  Recent Labs Lab 07/01/16 0500  07/03/16 0430 07/04/16 0543 07/05/16 0345  AST 2,162*  --  450*  --  145*  ALT 553*  --  208*  --  99*  ALKPHOS 594*  --  480*  --  360*  BILITOT 1.0  --  0.5  --  0.7  ALBUMIN 1.8*  1.9*  < > 1.6* 1.4* 1.5*  < > = values in this  interval not displayed.  Cardiac Enzymes No results for input(s): TROPONINI, PROBNP in the last 168 hours.  Glucose  Recent Labs Lab 07/04/16 1126 07/04/16 1557 07/04/16 2011 07/05/16 0004 07/05/16 0352 07/05/16 0755  GLUCAP 170* 192* 218* 248* 230* 227*    Imaging No results found.  STUDIES:  CT Head 9/27:  No acute abnormality. Chronic small vessel ischemic changes. Chronic lacunar infarct left basal ganglia & chronic ischemia right side of pons.  CT Abd/Pelvis w/o 9/27:  Early partial SBO w/ transition point LLQ with adhesions. Left humerus soft tissue swelling & intramuscular gas. Punctate hepatic granulomas noted.  Left Arm X-ray 9/27:  Diffuse soft tissue edema.  TTE 9/27:  LV w/ EF 60-65%. Grade 1 Diastolic dysfunction. No regional wall motion abnormalities. LA & RA normal in size. RV normal in size & function. Mild AS w/o AR. Mild MR w/o MS.  ABIs 10/3: R normal. L posterior artery noncompressible; suspect calcification.  CT head 10/3: No acute infarct, no hemorrhage or mass. Atrophy evident. Similar to prior study.   MICROBIOLOGY: MRSA PCR 9/27:  Negative Blood Ctx x 2 9/26: Coag Neg Staph 1/2 Urine Ctx 9/26:  5000 CFU E coli & 1000 CFU Klebsiella  C diff PCR 10/10:  Toxin & Antigen Positive  ANTIBIOTICS: Levaquin 9/26 - 9/28 Aztreonam 9/26 - 9/27 Vancomycin 9/26 - off Merrem 9/27 - 10/3 PO Vancocin 10/10 >>  SIGNIFICANT EVENTS: 9/26 - admit for presumed septic shock  LINES/TUBES: OETT 7.5 9/26 >> L IJ CVL 9/26 >> Foley 9/26 >> RIJ HD 9/30 >> Rectal tube 10/6 >>  ASSESSMENT / PLAN:  PULMONARY A: Acute Hypoxic Respiratory Failure - Unable to protect airway with acute encephalopathy. Positive 11L. OHS  P:   SBT & PS wean as tolerated  Full Vent Support for rest Tracheostomy if not able to extubate Hopefully will be able to extubate after another HD session  CARDIOVASCULAR A:  Shock - Likely sepsis. Cortisol 45.1. Resolved. H/O HTN & HLD  P:   Vitals per unit protocol Continuous telemetry monitoring Diuresis with HD  RENAL A:   Acute On Chronic Renal Failure Stage IV  - worsens without HD treatments Profound Metabolic Acidosis - Resolved w/ HD. Hyperkalemia - Resolved.  Lactic Acidosis - Resolved. Likely due to hypoperfusion in setting of Metformin.   Hyperphosphatemia - Secondary to acute renal failure. Resolved. Pseudohypocalcemia - Improving.   P:   Monitoring UOP with Foley Not a good long-term candidate for HD per Nephrology Appreciate Nephrology recommendations Trending electrolytes daily Replacing electrolytes as indicated  GASTROINTESTINAL A:   Partial SBO - Resolving on XR abd 9/30. Having BMs. H/O Ventral Hernia Repair. Possible Liver Cirrhosis. RUQ U/S normal. Ammonia WNL 10/5.  Transaminitis - Likely shock liver versus congestive hepatopathy. Improving.  Diarrhea - Secondary  to C diff.   P:   NPO Continuing Tube Feedings Pepcid VT daily Trending LFTs every other day  HEMATOLOGIC A:   Anemia - Chronic. Oozing wounds around mouth. S/P 1u on 10/6. Hgb worsening. Leukocytosis - Likely sepsis. Stable.  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 Unlikely with neg SRA.  P:  Trending cell counts daily w/ CBC Transfusion goal of 7.0 Transfusing 1u PRBC SCDs Heparin Rozel q8hr  INFECTIOUS A:   Severe Sepsis  C Diff Colitis - Found 10/10. E.coli and Klebsiella UTI - S/P 7 days Merrem. Sacral wound  P:   PO/OGT Vancomycin Day #2 Holding home Trimethoprim. Wound care consulted Plan to re-culture for fever   ENDOCRINE A:   Hypoglycemia - Resolved with Dextrose IVF. H/O DM Type 2 - A1c 5.8. H/O Hyperthyroidism - On PTU at home. TSH 0.331 & F T4 1.33 9/26. Repeat TSH 2.044 10/3.   P:   SSI per Sensitive Algorithm. Increase NPH to 20u q12hr  Holding PTU & Metformin.  NEUROLOGIC A:   Acute Encephalopathy - Question hepatic vs toxic metabolic vs hypoxia. No improvement in Lactulose Enema despite improved  Ammonia.  Favor toxic metabolic as is gradually improving with CRRT. Repeat CT head unchanged. TSH WNL. Folate WNL. H/O CVA - Minimal mobility.  P:   RASS goal: 0 to -1 Avoid sedating meds Fentanyl IV prn pain/discomfort  MUSCULOSKELETAL A: Edema Left Arm - Improved. Secondary to IV infiltration. No gas on X-ray 9/27.  P: Continue to monitor   FAMILY  - Updates:  Daughter updated at bedside 10/9 by Dr. Ashok Cordia. Indicates she wishes to continue with hemodialysis and tracheostomy if necessary.   - Inter-disciplinary family meet or Palliative Care meeting due by: Continued GOC discussion 10/6 with daughter [name].   TODAY'S SUMMAR:  65 y.o. female with T2DM, CKD, recurrent UTIs 2/2 incontinence, and stroke with subsequent severe debilitation (Left sided weakness; ECOG 4), who presented with AMS and found to be in shock, likely 2/2 UTI sepsis. Some improvement in level of alertness with CRRT. Completed 7-day course of meropenem 10/3. Ultimately will need LTAC for rehab. Started oral Vancomycin for C diff colitis. Plan for HD and diuresis tomorrow with repeat SBT & possible extubation.   I have spent a total of 34 minutes of critical care time today caring for the patient and reviewing the patient's electronic medical record.   Sonia Baller Ashok Cordia, M.D. [facility] Pulmonary & Critical Care Pager:  [phone] After 3pm or if no response, call [phone] 07/05/2016, 9:57 AM

## 2016-07-05 NOTE — Progress Notes (Signed)
Increased RR= 30's and low vT.  Increased psv to 10.  Pt tol well, RN aware.

## 2016-07-05 NOTE — Progress Notes (Signed)
Palliative:   Angelica Beck seems most unchanged. No family at bedside. She is resting comfortably. Spoke with RN and review notes. I did speak with daughter Angelica Beck via telephone - no questions and no changes. Updated her with plan for dialysis and hopes of being able to avoid a tracheostomy if possible but we will know more tomorrow.   No Charge  [name], NP Palliative Medicine Team Pager # [phone] (M-F 8a-5p) Team Phone # [phone] (Nights/Weekends)

## 2016-07-05 NOTE — Progress Notes (Signed)
Patient's daughter called wanting to know what process of extubation would look like and how team would know she is ready. Explained plan for extubation after dialysis tomorrow if spontaneous breathing trials continue to go well. Daughter plans to visit tomorrow morning and hopes for update on timing of extubation, as she would like to be present.

## 2016-07-05 NOTE — Progress Notes (Signed)
S: intubated.   O:BP 131/65   Pulse (!) 113   Temp 100.1 F (37.8 C) (Oral)   Resp (!) 30   Ht _0  (1.676 m)   Wt 108.8 kg (239 lb 13.8 oz)   SpO2 98%   BMI 38.71 kg/m   Intake/Output Summary (Last 24 hours) at 07/05/16 0748 Last data filed at 07/05/16 0600  Gross per 24 hour  Intake             2005 ml  Output             3555 ml  Net            -1550 ml   Weight change: -2.6 kg (-5 lb 11.7 oz) KSK:SHNGITJLL but alert CVS:RRR Resp:clear ant Abd: +BS NTND Ext: tr edema NEURO: eyes opened, responds to some ?, unable to move extremeties Rt IJ HD cath Lt IJ triple lumen   . aspirin  162 mg Per Tube Daily  . darbepoetin (ARANESP) injection - DIALYSIS  100 mcg Intravenous Q Mon-HD  . feeding supplement (VITAL HIGH PROTEIN)  1,000 mL Per Tube Q24H  . heparin subcutaneous  5,000 Units Subcutaneous Q8H  . insulin aspart  0-9 Units Subcutaneous Q4H  . insulin NPH Human  12 Units Subcutaneous BID AC & HS  . magnesium sulfate 1 - 4 g bolus IVPB  2 g Intravenous Once  . pantoprazole sodium  40 mg Per Tube Daily  . vancomycin  500 mg Oral Q6H   No results found. BMET    Component Value Date/Time   NA 135 07/05/2016 0345   K 3.8 07/05/2016 0345   CL 98 (L) 07/05/2016 0345   CO2 25 07/05/2016 0345   GLUCOSE 231 (H) 07/05/2016 0345   BUN 92 (H) 07/05/2016 0345   CREATININE 3.46 (H) 07/05/2016 0345   CALCIUM 8.9 07/05/2016 0345   GFRNONAA 13 (L) 07/05/2016 0345   GFRAA 15 (L) 07/05/2016 0345   CBC    Component Value Date/Time   WBC 31.1 (H) 07/05/2016 0345   RBC 2.85 (L) 07/05/2016 0345   HGB 8.1 (L) 07/05/2016 0345   HCT 24.4 (L) 07/05/2016 0345   HCT 26.8 (L) 06/27/2016 1536   PLT 236 07/05/2016 0345   MCV 85.6 07/05/2016 0345   MCH 28.4 07/05/2016 0345   MCHC 33.2 07/05/2016 0345   RDW 15.1 07/05/2016 0345   LYMPHSABS 0.9 07/05/2016 0345   MONOABS 1.6 (H) 07/05/2016 0345   EOSABS 0.0 07/05/2016 0345   BASOSABS 0.0 07/05/2016 0345     Assessment:  1.  Acute on CKD 3 secondary to ATN from shock/sepsis.  Still with minimal UO 2. VDRF 3 Anemia on aranesp 4. ? HIT, Plt better  Plan: 1. HD tomorrow.  Dr Lorrene Reid met with daughter and plans are to cont HD.  Hopefully renal fx will recover, if not she will most likely need LTAC.   Gretchen Weinfeld T

## 2016-07-05 NOTE — Progress Notes (Signed)
PULMONARY / CRITICAL CARE MEDICINE   Name: Angelica Beck MRN: [id number] DOB: 04/21/1951    ADMISSION DATE:  06/20/2016 CONSULTATION DATE:  06/20/2016  REFERRING MD:  EDP Dr Cathleen Fears   CHIEF COMPLAINT:  AMS  HISTORY OF PRESENT ILLNESS:   65 year old female with past medical history as below, which is significant for stroke with subsequent debilitation, diabetes, chronic kidney disease, chronic urinary tract infections due to incontinence, for which she takes chronic antibiotics. She lives at home with her daughter where she requires full assistance she is unable to truly ambulate and spends most of her time in a hospital bed/recliner. She relies on family members almost entirely for activities of daily living. She was in her usual state of health until about 9/23 when she developed nausea and vomiting. This persisted for 2 days until symptoms subsided, however, she continued to complain of abdominal pain. 9/26 when the patient's daughter got home from work the patient was found to be unresponsive. EMS was called and she was found hypoglycemic which was corrected, however, altered mental status did not improve. Upon arrival to the emergency department she continued to be lethargic and was found to be hypotensive with hypoxemia. Laboratory evaluation significant for potassium 7.4, bicarbonate less than 7, creatinine 6.51, BUN 95, WBC 12.3, lactic acid 13.88, pH 7.07.  SUBJECTIVE:   Continues to be alert. Indicates no pain by shaking head.  Starting SBT this am with 0 pressure support.  3L of fluid taken off with HD 10/10 Hemoglobin stable after transfusion Leukocytosis stable  VITAL SIGNS: BP (!) 110/59   Pulse (!) 113   Temp 100.1 F (37.8 C) (Oral)   Resp (!) 23   Ht '5\' 6"'$  (1.676 m)   Wt 239 lb 13.8 oz (108.8 kg)   SpO2 97%   BMI 38.71 kg/m   HEMODYNAMICS:    VENTILATOR SETTINGS: Vent Mode: PRVC FiO2 (%):  [40 %] 40 % Set Rate:  [14 bmp] 14 bmp Vt Set:  [640 mL] 640  mL PEEP:  [5 cmH20] 5 cmH20 Pressure Support:  [8 cmH20-15 cmH20] 8 cmH20 Plateau Pressure:  [10 cmH20-12 cmH20] 12 cmH20  INTAKE / OUTPUT: I/O last 3 completed shifts: In: 2875 [I.V.:350; Blood:335; NG/GT:2190] Out: 8938 [Other:3005; Stool:550]  PHYSICAL EXAMINATION: General:  Obese female. Chronically ill appearing. Neuro: Opens eyes to voice, tracks, nods, squeezes hand and wiggles toes but with decreased strength (R > L) HEENT:  No scleral icterus or injection. ETT in place. Cardiovascular:  Regular rhythm and rate. Trace LE bilaterally to knee but with puffy hands and feet (2+). Unable to appreciate JVD. Lungs: CTAB. Symmetric chest rise on ventilator.  Abdomen:  Soft. Protuberant. + BS. NT.  Musculoskeletal:  No joint deformity or effusion. No crepitus. Skin:  Warm & dry. Sacral sloughing.   LABS:  BMET  Recent Labs Lab 07/03/16 0430 07/04/16 0543 07/05/16 0345  NA 135 133* 135  K 4.3 4.4 3.8  CL 96* 95* 98*  CO2 '27 24 25  '$ BUN 87* 118* 92*  CREATININE 3.31* 4.15* 3.46*  GLUCOSE 230* 257* 231*    Electrolytes  Recent Labs Lab 07/03/16 0430 07/04/16 0543 07/05/16 0345  CALCIUM 8.5* 8.8* 8.9  MG 1.8 1.7 1.6*  PHOS 2.0* 2.3* 2.6    CBC  Recent Labs Lab 07/03/16 0430 07/04/16 0543 07/04/16 1157 07/05/16 0345  WBC 30.3* 31.5*  --  31.1*  HGB 7.2* 6.9* 8.5* 8.1*  HCT 22.4* 21.5* 26.2* 24.4*  PLT 176 205  --  [facility] 06/29/16 0914  INR 1.38    Sepsis Markers No results for input(s): LATICACIDVEN, PROCALCITON, O2SATVEN in the last 168 hours.  ABG No results for input(s): PHART, PCO2ART, PO2ART in the last 168 hours.  Liver Enzymes  Recent Labs Lab 07/01/16 0500  07/03/16 0430 07/04/16 0543 07/05/16 0345  AST 2,162*  --  450*  --  145*  ALT 553*  --  208*  --  99*  ALKPHOS 594*  --  480*  --  360*  BILITOT 1.0  --  0.5  --  0.7  ALBUMIN 1.8*  1.9*  < > 1.6* 1.4* 1.5*  < > = values in this interval not  displayed.  Cardiac Enzymes No results for input(s): TROPONINI, PROBNP in the last 168 hours.  Glucose  Recent Labs Lab 07/04/16 0351 07/04/16 0803 07/04/16 1126 07/04/16 1557 07/04/16 2011 07/05/16 0352  GLUCAP 228* 208* 170* 192* 218* 230*    Imaging No results found.  STUDIES:  CT Head 9/27:  No acute abnormality. Chronic small vessel ischemic changes. Chronic lacunar infarct left basal ganglia & chronic ischemia right side of pons.  CT Abd/Pelvis w/o 9/27:  Early partial SBO w/ transition point LLQ with adhesions. Left humerus soft tissue swelling & intramuscular gas. Punctate hepatic granulomas noted.  Left Arm X-ray 9/27:  Diffuse soft tissue edema.  TTE 9/27:  LV w/ EF 60-65%. Grade 1 Diastolic dysfunction. No regional wall motion abnormalities. LA & RA normal in size. RV normal in size & function. Mild AS w/o AR. Mild MR w/o MS.  ABIs 10/3: R normal. L posterior artery noncompressible; suspect calcification.  CT head 10/3: No acute infarct, no hemorrhage or mass. Atrophy evident. Similar to prior study.   MICROBIOLOGY: MRSA PCR 9/27:  Negative Blood Ctx x 2 9/26 >> Coag Neg Staph 1/2 Urine Ctx 9/26:  5000 CFU E coli & 1000 CFU Klebsiella  C diff PCR 10/10: antigen and toxin positive   ANTIBIOTICS: Levaquin 9/26 - 9/28 Aztreonam 9/26 - 9/27 Vancomycin 9/26 > off Merrem 9/27 > 10/3 PO Vanc 10/10 >>  SIGNIFICANT EVENTS: 9/26 - admit for presumed septic shock  LINES/TUBES: OETT 7.5 9/26 >> L IJ CVL 9/26 >> Foley 9/26 >> 10/8 RIJ HD 9/30 >> Rectal tube 10/6 >>  DISCUSSION: Angelica Beck is a 65-y/o female with T2DM, CKD, recurrent UTIs 2/2 incontinence, and stroke with subsequent severe debilitation (Left sided weakness; ECOG 4), who presented with AMS and found to be in shock, likely 2/2 UTI sepsis. Some improvement in level of alertness with CRRT. Completed 7-day course of meropenem 10/3. Has not been making urine after CRRT so received iHD 10/5. Making  slow improvements in level of alertness but unclear that she can successfully extubate.  Concern remains for ability to regain former function given reliance on hemodialysis and continued severe weakness. Suspect that we will need to consider extubation when we believe she is optimized, acknowledge that if she fails we would make her comfortable. Scenario complicated by the fact that hepatic and renal injuries have not resolved either. Daughter wanting aggressive care. To pursue continued HD through perm cath once leukocytosis improves.   ASSESSMENT / PLAN:  PULMONARY A: Acute Hypoxic Respiratory Failure - Unable to protect airway with acute encephalopathy.  Obesity hypoventilation syndrome VDRF P:   SBT as tolerated. Possible extubation 10/11; Dr. Ashok Cordia discussed with daughter possibility of needing tracheostomy if she fails, and daughter agreed Oral care for  PNA prophylaxis  CXR intermittently.  CARDIOVASCULAR A:  Shock - Likely sepsis. Cortisol 45.1. Resolved. H/O HTN & HLD P:  Monitor hemodynamics.  RENAL A:   Acute On Chronic Renal Failure Stage IV  - worsens without HD treatments Profound Metabolic Acidosis  Hyperkalemia - Resolved.  Lactic Acidosis, suspect due to hypoperfusion with possible contribution of renal injury 2/2 metformin, persisting likely due to poor renal clearance.  Resolved 10/2 after CRRT. Hyperphosphatemia - Secondary to acute renal failure. Resolved. Pseudohypocalcemia - Improving.  P:   To pursue continued HD Consult VVS about perm cath placement once leukocytosis resolved  GASTROINTESTINAL A:   Partial SBO - Resolved, having BMs. H/O Ventral Hernia Repair. 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 RUQ U/S normal. Ammonia WNL 10/5.  Transaminitis improving, suspect shock liver from poor renal clearance.  P:   Continue tube feeds. Protonix per tube CMP 10/13 (every other day)  HEMATOLOGIC A:   Anemia - Chronic. Oozing wounds around mouth. Hgb decreasing  slowly after transfusion (6.9 on 10/10) Leukocytosis - Initially thought to be due to sepsis. Now increasing but not spiking regular fevers -- had isolated fever to 100.34F yesterday. HIT ab inconclusive. SRA negative.  P:  SCD's / aspirin. Plavix held SQ heparin TID S/p 2 u transfusion 10/6 and 10/10 Daily CBCs  Transfusion goal of 7.0.  aranesp per renal  INFECTIOUS A:   Severe Sepsis - due to E.coli and Klebsiella UTI. Unlikely Coag Neg Staph Bacteremia, suspect contaminant Leukocytosis rising again Sacral wound C diff positive P:   PO vancomycin, Day #2, to end 10/23 for 14-day course of treatment Completed course of meropenem Following cultures to completion. Holding home Trimethoprim. Wound care consulted, appreciate recommendations of using foam dressings Rectal tube in place  ENDOCRINE A:   Hypoglycemia - Resolved with Dextrose IVF. H/O DM Type 2 - A1c 5.8. H/O Hyperthyroidism - On PTU at home. TSH 0.331 & F T4 1.33 9/26. Repeat TSH 2.044 10/3. Now TSH 0.554 and F T4 1.30.  CBCs in low 200s consistently P:   Started BID NPH 10 U BID 10/10 but still required 16 units novolog. Will increase NPH to 12 U BID.  Holding PTU & Metformin. Consider starting methimazole instead if TSH decreases further, as PTU can cause hepatotoxicity. Recheck TSH weekly  NEUROLOGIC A:   Acute Encephalopathy - Question hepatic vs toxic metabolic vs hypoxia. No improvement in Lactulose Enema despite improved Ammonia.  Favor toxic metabolic as is gradually improving with CRRT. Repeat CT head unchanged. TSH WNL. Folate WNL. H/O CVA - Minimal mobility. P:   RASS goal: 0 to -1 Avoid sedating meds.  MUSCULOSKELETAL A: Edema Left Arm - Improved. Secondary to IV infiltration. No gas on X-ray 9/27. P: Continue to monitor   FAMILY  - Updates: None available 10/9. Daughter continues to want as aggressive treatment as possible at this time, given small daily improvements.   -  Inter-disciplinary family meet or Palliative Care meeting due by: Performed 10/6, 10/7, to continue.   Olene Floss, MD  [facility], PGY-2

## 2016-07-06 LAB — RENAL FUNCTION PANEL
ANION GAP: 13 (ref 5–15)
Albumin: 1.4 g/dL — ABNORMAL LOW (ref 3.5–5.0)
BUN: 125 mg/dL — ABNORMAL HIGH (ref 6–20)
CALCIUM: 9 mg/dL (ref 8.9–10.3)
CHLORIDE: 98 mmol/L — AB (ref 101–111)
CO2: 24 mmol/L (ref 22–32)
Creatinine, Ser: 4.29 mg/dL — ABNORMAL HIGH (ref 0.44–1.00)
GFR calc non Af Amer: 10 mL/min — ABNORMAL LOW (ref >60)
GFR, EST AFRICAN AMERICAN: 12 mL/min — AB (ref >60)
GLUCOSE: 176 mg/dL — AB (ref 65–99)
POTASSIUM: 4 mmol/L (ref 3.5–5.1)
Phosphorus: 3.3 mg/dL (ref 2.5–4.6)
SODIUM: 135 mmol/L (ref 135–145)

## 2016-07-06 LAB — GLUCOSE, CAPILLARY
GLUCOSE-CAPILLARY: 211 mg/dL — AB (ref 65–99)
GLUCOSE-CAPILLARY: 131 mg/dL — AB (ref 65–99)
GLUCOSE-CAPILLARY: 141 mg/dL — AB (ref 65–99)
Glucose-Capillary: 166 mg/dL — ABNORMAL HIGH (ref 65–99)
Glucose-Capillary: 173 mg/dL — ABNORMAL HIGH (ref 65–99)

## 2016-07-06 LAB — MAGNESIUM: Magnesium: 2.2 mg/dL (ref 1.7–2.4)

## 2016-07-06 LAB — CBC WITH DIFFERENTIAL/PLATELET
BASOS ABS: 0 10*3/uL (ref 0.0–0.1)
BASOS PCT: 0 %
Eosinophils Absolute: 0.3 10*3/uL (ref 0.0–0.7)
Eosinophils Relative: 1 %
HEMATOCRIT: 23.2 % — AB (ref 36.0–46.0)
Hemoglobin: 7.7 g/dL — ABNORMAL LOW (ref 12.0–15.0)
LYMPHS ABS: 1.5 10*3/uL (ref 0.7–4.0)
Lymphocytes Relative: 6 %
MCH: 28.4 pg (ref 26.0–34.0)
MCHC: 33.2 g/dL (ref 30.0–36.0)
MCV: 85.6 fL (ref 78.0–100.0)
MONOS PCT: 5 %
Monocytes Absolute: 1.3 10*3/uL — ABNORMAL HIGH (ref 0.1–1.0)
NEUTROS ABS: 22.4 10*3/uL — AB (ref 1.7–7.7)
Neutrophils Relative %: 88 %
Platelets: 258 10*3/uL (ref 150–400)
RBC: 2.71 MIL/uL — ABNORMAL LOW (ref 3.87–5.11)
RDW: 15.6 % — AB (ref 11.5–15.5)
WBC: 25.5 10*3/uL — ABNORMAL HIGH (ref 4.0–10.5)

## 2016-07-06 MED ORDER — INSULIN NPH (HUMAN) (ISOPHANE) 100 UNIT/ML ~~LOC~~ SUSP: 25.0000 [IU] | Freq: Two times a day (BID) | SUBCUTANEOUS | Status: DC

## 2016-07-06 MED ORDER — PNEUMOCOCCAL VAC POLYVALENT 25 MCG/0.5ML IJ INJ
0.5000 mL | INJECTION | INTRAMUSCULAR | Status: DC
Start: 2016-07-07 — End: 2016-07-07
  Filled 2016-07-06: qty 0.5

## 2016-07-06 MED ORDER — INSULIN NPH (HUMAN) (ISOPHANE) 100 UNIT/ML ~~LOC~~ SUSP
25.0000 [IU] | Freq: Two times a day (BID) | SUBCUTANEOUS | Status: DC
Start: 2016-07-06 — End: 2016-07-07
  Administered 2016-07-06 (×2): 25 [IU] via SUBCUTANEOUS

## 2016-07-06 MED ORDER — INFLUENZA VAC SPLIT QUAD 0.5 ML IM SUSY
0.5000 mL | PREFILLED_SYRINGE | INTRAMUSCULAR | Status: DC
Start: 2016-07-07 — End: 2016-07-07
  Filled 2016-07-06: qty 0.5

## 2016-07-06 NOTE — Progress Notes (Signed)
PULMONARY / CRITICAL CARE MEDICINE   Name: Angelica Beck MRN: [id number] DOB: Aug 07, 1951    ADMISSION DATE:  06/20/2016 CONSULTATION DATE:  06/20/2016  REFERRING MD:  EDP Dr Cathleen Fears   CHIEF COMPLAINT:  AMS  HISTORY OF PRESENT ILLNESS:   65 year old female with past medical history as below, which is significant for stroke with subsequent debilitation, diabetes, chronic kidney disease, chronic urinary tract infections due to incontinence, for which she takes chronic antibiotics. She lives at home with her daughter where she requires full assistance she is unable to truly ambulate and spends most of her time in a hospital bed/recliner. She relies on family members almost entirely for activities of daily living. She was in her usual state of health until about 9/23 when she developed nausea and vomiting. This persisted for 2 days until symptoms subsided, however, she continued to complain of abdominal pain. 9/26 when the patient's daughter got home from work the patient was found to be unresponsive. EMS was called and she was found hypoglycemic which was corrected, however, altered mental status did not improve. Upon arrival to the emergency department she continued to be lethargic and was found to be hypotensive with hypoxemia. Laboratory evaluation significant for potassium 7.4, bicarbonate less than 7, creatinine 6.51, BUN 95, WBC 12.3, lactic acid 13.88, pH 7.07.  SUBJECTIVE:   Continues to be alert. Indicates no pain by shaking head.   VITAL SIGNS: BP 128/65   Pulse (!) 103   Temp 98.1 F (36.7 C) (Oral)   Resp 18   Ht '5\' 6"'$  (1.676 m)   Wt 241 lb 2.9 oz (109.4 kg)   SpO2 100%   BMI 38.93 kg/m   HEMODYNAMICS:    VENTILATOR SETTINGS: Vent Mode: PRVC FiO2 (%):  [40 %] 40 % Set Rate:  [20 bmp] 20 bmp Vt Set:  [470 mL] 470 mL PEEP:  [5 cmH20] 5 cmH20 Pressure Support:  [0 cmH20-10 cmH20] 10 cmH20 Plateau Pressure:  [15 cmH20-19 cmH20] 15 cmH20  INTAKE / OUTPUT: I/O last 3  completed shifts: In: 2500 [I.V.:350; NG/GT:2100; IV Piggyback:50] Out: 525 [Stool:525]  PHYSICAL EXAMINATION: General:  Obese female. Chronically ill appearing. Neuro: Opens eyes to voice, tracks, nods, squeezes hand and wiggles toes but with decreased strength (R > L) HEENT:  No scleral icterus or injection. ETT in place. Cardiovascular:  Regular rhythm and rate. 1-2+ LE edema bilaterally to knee but with puffy hands and feet (2+). Unable to appreciate JVD. Lungs: CTAB. Symmetric chest rise on ventilator.  Abdomen:  Soft. Protuberant. + BS. NT.  Musculoskeletal:  No joint deformity or effusion. No crepitus. Skin:  Warm & dry. Sacral sloughing.   LABS:  BMET  Recent Labs Lab 07/04/16 0543 07/05/16 0345 07/06/16 0400  NA 133* 135 135  K 4.4 3.8 4.0  CL 95* 98* 98*  CO2 '24 25 24  '$ BUN 118* 92* 125*  CREATININE 4.15* 3.46* 4.29*  GLUCOSE 257* 231* 176*    Electrolytes  Recent Labs Lab 07/04/16 0543 07/05/16 0345 07/06/16 0400  CALCIUM 8.8* 8.9 9.0  MG 1.7 1.6* 2.2  PHOS 2.3* 2.6 3.3    CBC  Recent Labs Lab 07/04/16 0543 07/04/16 1157 07/05/16 0345 07/06/16 0400  WBC 31.5*  --  31.1* 25.5*  HGB 6.9* 8.5* 8.1* 7.7*  HCT 21.5* 26.2* 24.4* 23.2*  PLT 205  --  236 258    Coag's  Recent Labs Lab 06/29/16 0914  INR 1.38    Sepsis Markers No results for input(s): LATICACIDVEN,  PROCALCITON, O2SATVEN in the last 168 hours.  ABG No results for input(s): PHART, PCO2ART, PO2ART in the last 168 hours.  Liver Enzymes  Recent Labs Lab 07/01/16 0500  07/03/16 0430 07/04/16 0543 07/05/16 0345 07/06/16 0400  AST 2,162*  --  450*  --  145*  --   ALT 553*  --  208*  --  99*  --   ALKPHOS 594*  --  480*  --  360*  --   BILITOT 1.0  --  0.5  --  0.7  --   ALBUMIN 1.8*  1.9*  < > 1.6* 1.4* 1.5* 1.4*  < > = values in this interval not displayed.  Cardiac Enzymes No results for input(s): TROPONINI, PROBNP in the last 168 hours.  Glucose  Recent  Labs Lab 07/05/16 0755 07/05/16 1144 07/05/16 1516 07/05/16 1942 07/05/16 2356 07/06/16 0401  GLUCAP 227* 228* 202* 174* 176* 166*    Imaging No results found.  STUDIES:  CT Head 9/27:  No acute abnormality. Chronic small vessel ischemic changes. Chronic lacunar infarct left basal ganglia & chronic ischemia right side of pons.  CT Abd/Pelvis w/o 9/27:  Early partial SBO w/ transition point LLQ with adhesions. Left humerus soft tissue swelling & intramuscular gas. Punctate hepatic granulomas noted.  Left Arm X-ray 9/27:  Diffuse soft tissue edema.  TTE 9/27:  LV w/ EF 60-65%. Grade 1 Diastolic dysfunction. No regional wall motion abnormalities. LA & RA normal in size. RV normal in size & function. Mild AS w/o AR. Mild MR w/o MS.  ABIs 10/3: R normal. L posterior artery noncompressible; suspect calcification.  CT head 10/3: No acute infarct, no hemorrhage or mass. Atrophy evident. Similar to prior study.   MICROBIOLOGY: MRSA PCR 9/27:  Negative Blood Ctx x 2 9/26 >> Coag Neg Staph 1/2 Urine Ctx 9/26:  5000 CFU E coli & 1000 CFU Klebsiella  C diff PCR 10/10: antigen and toxin positive   ANTIBIOTICS: Levaquin 9/26 - 9/28 Aztreonam 9/26 - 9/27 Vancomycin 9/26 > off Merrem 9/27 > 10/3 PO Vanc 10/10 >>  SIGNIFICANT EVENTS: 9/26 - admit for presumed septic shock  LINES/TUBES: OETT 7.5 9/26 >> L IJ CVL 9/26 >> Foley 9/26 >> 10/8 RIJ HD 9/30 >> Rectal tube 10/6 >>  DISCUSSION: Angelica Beck is a 65-y/o female with T2DM, CKD, recurrent UTIs 2/2 incontinence, and stroke with subsequent severe debilitation (Left sided weakness; ECOG 4), who presented with AMS and found to be in shock, likely 2/2 UTI sepsis. Some improvement in level of alertness with CRRT. Completed 7-day course of meropenem 10/3. Has not been making urine after CRRT so received iHD 10/5. Making slow improvements in level of alertness but unclear that she can successfully extubate.  Concern remains for ability to  regain former function given reliance on hemodialysis and continued severe weakness. Suspect that we will need to consider extubation when we believe she is optimized, acknowledge that if she fails we would make her comfortable. Scenario complicated by the fact that hepatic and renal injuries have not resolved either. Daughter wanting aggressive care. To pursue continued HD through perm cath once leukocytosis improves. Plan to extubate 10/12 after HD.   ASSESSMENT / PLAN:  PULMONARY A: Acute Hypoxic Respiratory Failure - Unable to protect airway with acute encephalopathy.  Obesity hypoventilation syndrome VDRF P:   SBT as tolerated. Possible extubation 10/12 after dialysis; Dr. Ashok Cordia discussed with daughter possibility of needing tracheostomy if she fails, and daughter agreed Oral care for PNA  prophylaxis  CXR intermittently.  CARDIOVASCULAR A:  Shock - Likely sepsis. Cortisol 45.1. Resolved. H/O HTN & HLD P:  Monitor hemodynamics.  RENAL A:   Acute On Chronic Renal Failure Stage IV  - worsens without HD treatments Profound Metabolic Acidosis  Hyperkalemia - Resolved.  Lactic Acidosis, suspect due to hypoperfusion with possible contribution of renal injury 2/2 metformin, persisting likely due to poor renal clearance.  Resolved 10/2 after CRRT. Hyperphosphatemia - Secondary to acute renal failure. Resolved. Pseudohypocalcemia - Improving.  Up 4.8 L this admission P:   To pursue continued HD Consult VVS about perm cath placement once leukocytosis resolved  GASTROINTESTINAL A:   Partial SBO - Resolved, having BMs. H/O Ventral Hernia Repair. 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 RUQ U/S normal. Ammonia WNL 10/5.  Transaminitis improving, suspect shock liver from poor renal clearance.  P:   Continue tube feeds. Protonix per tube CMP 10/13 (every other day)  HEMATOLOGIC A:   Anemia - Chronic. Oozing wounds around mouth. Hgb decreasing slowly after transfusion (6.9 on  10/10) Leukocytosis - Improved slightly (25.5 < 31.1) HIT ab inconclusive. SRA negative.  P:  SCD's / aspirin. Plavix held SQ heparin TID S/p 2 u transfusion 10/6 and 10/10 Daily CBCs  Transfusion goal of 7.0.  aranesp per renal  INFECTIOUS A:   Severe Sepsis - due to E.coli and Klebsiella UTI. Unlikely Coag Neg Staph Bacteremia, suspect contaminant Leukocytosis rising again Sacral wound C diff positive P:   PO vancomycin, Day #3, to end 10/23 for 14-day course of treatment Completed course of meropenem Following cultures to completion. Holding home Trimethoprim. Wound care consulted, appreciate recommendations of using foam dressings Rectal tube in place  ENDOCRINE A:   Hypoglycemia - Resolved with Dextrose IVF. H/O DM Type 2 - A1c 5.8. H/O Hyperthyroidism - On PTU at home. TSH 0.331 & F T4 1.33 9/26. Repeat TSH 2.044 10/3. Now TSH 0.554 and F T4 1.30.  CBGs 166-228 P:   NPH to 25  Holding PTU & Metformin. Consider starting methimazole instead if TSH decreases further, as PTU can cause hepatotoxicity. Recheck TSH weekly  NEUROLOGIC A:   Acute Encephalopathy - Question hepatic vs toxic metabolic vs hypoxia. No improvement in Lactulose Enema despite improved Ammonia.  Favor toxic metabolic as is gradually improving with CRRT. Repeat CT head unchanged. TSH WNL. Folate WNL. H/O CVA - Minimal mobility. P:   RASS goal: 0 to -1 Avoid sedating meds.  MUSCULOSKELETAL A: Edema Left Arm - Improved. Secondary to IV infiltration. No gas on X-ray 9/27. P: Continue to monitor   FAMILY  - Updates: Spoke with daughter over phone 10/11 about plan for extubation 10/12 if SBT goes well.   - Inter-disciplinary family meet or Palliative Care meeting due by: Performed 10/6, 10/7, to continue.   Olene Floss, MD  [place], PGY-2

## 2016-07-06 NOTE — Progress Notes (Signed)
S: intubated.   O:BP 128/65   Pulse (!) 103   Temp 98.1 F (36.7 C) (Oral)   Resp 18   Ht '5\' 6"'  (1.676 m)   Wt 109.4 kg (241 lb 2.9 oz)   SpO2 100%   BMI 38.93 kg/m   Intake/Output Summary (Last 24 hours) at 07/06/16 0735 Last data filed at 07/06/16 0600  Gross per 24 hour  Intake             1660 ml  Output              300 ml  Net             1360 ml   Weight change: 0.6 kg (1 lb 5.2 oz) ZOX:[id number] but opens eyes CVS:RRR Resp:clear ant Abd: +BS NTND Ext: tr edema NEURO: eyes opened, unable to move extremeties Rt IJ HD cath temp Lt IJ triple lumen   . aspirin  162 mg Per Tube Daily  . chlorhexidine gluconate (MEDLINE KIT)  15 mL Mouth Rinse BID  . darbepoetin (ARANESP) injection - DIALYSIS  100 mcg Intravenous Q Mon-HD  . famotidine  20 mg Per Tube Daily  . feeding supplement (VITAL HIGH PROTEIN)  1,000 mL Per Tube Q24H  . heparin subcutaneous  5,000 Units Subcutaneous Q8H  . insulin aspart  0-9 Units Subcutaneous Q4H  . insulin NPH Human  20 Units Subcutaneous BID AC & HS  . mouth rinse  15 mL Mouth Rinse QID  . vancomycin  500 mg Oral Q6H   No results found. BMET    Component Value Date/Time   NA 135 07/06/2016 0400   K 4.0 07/06/2016 0400   CL 98 (L) 07/06/2016 0400   CO2 24 07/06/2016 0400   GLUCOSE 176 (H) 07/06/2016 0400   BUN 125 (H) 07/06/2016 0400   CREATININE 4.29 (H) 07/06/2016 0400   CALCIUM 9.0 07/06/2016 0400   GFRNONAA 10 (L) 07/06/2016 0400   GFRAA 12 (L) 07/06/2016 0400   CBC    Component Value Date/Time   WBC 25.5 (H) 07/06/2016 0400   RBC 2.71 (L) 07/06/2016 0400   HGB 7.7 (L) 07/06/2016 0400   HCT 23.2 (L) 07/06/2016 0400   HCT 26.8 (L) 06/27/2016 1536   PLT 258 07/06/2016 0400   MCV 85.6 07/06/2016 0400   MCH 28.4 07/06/2016 0400   MCHC 33.2 07/06/2016 0400   RDW 15.6 (H) 07/06/2016 0400   LYMPHSABS 1.5 07/06/2016 0400   MONOABS 1.3 (H) 07/06/2016 0400   EOSABS 0.3 07/06/2016 0400   BASOSABS 0.0 07/06/2016 0400      Assessment:  1. Acute on CKD 3 secondary to ATN from shock/sepsis.  Still with minimal UO 2. VDRF 3 Anemia on aranesp 4. ? HIT, Plt better 5. C diff colitis on oral vanco  Plan: 1. HD today.  Dr Lorrene Reid met with daughter and plans are to cont HD.  Hopefully renal fx will recover, if not she will most likely need LTAC. 2.  Will need perm cath placed at some point   Jerrye Seebeck T

## 2016-07-06 NOTE — Progress Notes (Signed)
PULMONARY / CRITICAL CARE MEDICINE   Name: Angelica Beck MRN: [id number] DOB: [birth year]    ADMISSION DATE:  06/20/2016 CONSULTATION DATE:  06/20/2016  REFERRING MD:  EDP Dr Cathleen Fears   CHIEF COMPLAINT:  AMS  HISTORY OF PRESENT ILLNESS:   65 year old female with past medical history as below, which is significant for stroke with subsequent debilitation, diabetes, chronic kidney disease, chronic urinary tract infections due to incontinence, for which she takes chronic antibiotics. She lives at home with her daughter where she requires full assistance she is unable to truly ambulate and spends most of her time in a hospital bed/recliner. She relies on family members almost entirely for activities of daily living. She was in her usual state of health until about 9/23 when she developed nausea and vomiting. This persisted for 2 days until symptoms subsided, however, she continued to complain of abdominal pain. 9/26 when the patient's daughter got home from work the patient was found to be unresponsive. EMS was called and she was found hypoglycemic which was corrected, however, altered mental status did not improve. Upon arrival to the emergency department she continued to be lethargic and was found to be hypotensive with hypoxemia. Laboratory evaluation significant for potassium 7.4, bicarbonate less than 7, creatinine 6.51, BUN 95, WBC 12.3, lactic acid 13.88, pH 7.07.  SUBJECTIVE:  No acute events overnight. Planned for HD today.   REVIEW OF SYSTEMS:  Unable to obtain with intubation.   VITAL SIGNS: BP 119/62   Pulse 99   Temp 98.6 F (37 C) (Oral)   Resp (!) 21   Ht '5\' 6"'$  (1.676 m)   Wt 241 lb 2.9 oz (109.4 kg)   SpO2 100%   BMI 38.93 kg/m   HEMODYNAMICS:    VENTILATOR SETTINGS: Vent Mode: CPAP;PSV FiO2 (%):  [40 %] 40 % Set Rate:  [20 bmp] 20 bmp Vt Set:  [470 mL] 470 mL PEEP:  [5 cmH20] 5 cmH20 Pressure Support:  [0 cmH20-10 cmH20] 0 cmH20 Plateau Pressure:  [15 cmH20-19  cmH20] 15 cmH20  INTAKE / OUTPUT: I/O last 3 completed shifts: In: 2570 [I.V.:360; NG/GT:2160; IV Piggyback:50] Out: 525 [Stool:525]  PHYSICAL EXAMINATION: General:  Obese female. No distress. No family at bedside. Neuro: Nods appropriately. Tracks to voice. Wiggles right toes bilaterally on command. HEENT:  No scleral icterus or injection. ETT in place. Protruding tongue. Cardiovascular:  Regular rhythm and rate. Ansarca persists. Lungs:  Improving bilateral aeration & clear breath sounds.  Symmetric chest wall rise on ventilator. Moderately increased work of breathing on PS 0/5 FiO2 0.4.  Abdomen:  Soft. Protuberant. Normoactive bowel sounds. Musculoskeletal:  No joint deformity or effusion.  Skin:  Warm & dry. Sacral sloughing.   LABS:  BMET  Recent Labs Lab 07/04/16 0543 07/05/16 0345 07/06/16 0400  NA 133* 135 135  K 4.4 3.8 4.0  CL 95* 98* 98*  CO2 '24 25 24  '$ BUN 118* 92* 125*  CREATININE 4.15* 3.46* 4.29*  GLUCOSE 257* 231* 176*    Electrolytes  Recent Labs Lab 07/04/16 0543 07/05/16 0345 07/06/16 0400  CALCIUM 8.8* 8.9 9.0  MG 1.7 1.6* 2.2  PHOS 2.3* 2.6 3.3    CBC  Recent Labs Lab 07/04/16 0543 07/04/16 1157 07/05/16 0345 07/06/16 0400  WBC 31.5*  --  31.1* 25.5*  HGB 6.9* 8.5* 8.1* 7.7*  HCT 21.5* 26.2* 24.4* 23.2*  PLT 205  --  236 258    Coag's No results for input(s): APTT, INR in the last 168 hours.  Sepsis Markers No results for input(s): LATICACIDVEN, PROCALCITON, O2SATVEN in the last 168 hours.  ABG No results for input(s): PHART, PCO2ART, PO2ART in the last 168 hours.  Liver Enzymes  Recent Labs Lab 07/01/16 0500  07/03/16 0430 07/04/16 0543 07/05/16 0345 07/06/16 0400  AST 2,162*  --  450*  --  145*  --   ALT 553*  --  208*  --  99*  --   ALKPHOS 594*  --  480*  --  360*  --   BILITOT 1.0  --  0.5  --  0.7  --   ALBUMIN 1.8*  1.9*  < > 1.6* 1.4* 1.5* 1.4*  < > = values in this interval not displayed.  Cardiac  Enzymes No results for input(s): TROPONINI, PROBNP in the last 168 hours.  Glucose  Recent Labs Lab 07/05/16 1144 07/05/16 1516 07/05/16 1942 07/05/16 2356 07/06/16 0401 07/06/16 0746  GLUCAP 228* 202* 174* 176* 166* 211*    Imaging No results found.  STUDIES:  CT Head 9/27:  No acute abnormality. Chronic small vessel ischemic changes. Chronic lacunar infarct left basal ganglia & chronic ischemia right side of pons.  CT Abd/Pelvis w/o 9/27:  Early partial SBO w/ transition point LLQ with adhesions. Left humerus soft tissue swelling & intramuscular gas. Punctate hepatic granulomas noted.  Left Arm X-ray 9/27:  Diffuse soft tissue edema.  TTE 9/27:  LV w/ EF 60-65%. Grade 1 Diastolic dysfunction. No regional wall motion abnormalities. LA & RA normal in size. RV normal in size & function. Mild AS w/o AR. Mild MR w/o MS.  ABIs 10/3: R normal. L posterior artery noncompressible; suspect calcification.  CT head 10/3: No acute infarct, no hemorrhage or mass. Atrophy evident. Similar to prior study.   MICROBIOLOGY: MRSA PCR 9/27:  Negative Blood Ctx x 2 9/26: Coag Neg Staph 1/2 Urine Ctx 9/26:  5000 CFU E coli & 1000 CFU Klebsiella  C diff PCR 10/10:  Toxin & Antigen Positive  ANTIBIOTICS: Levaquin 9/26 - 9/28 Aztreonam 9/26 - 9/27 Vancomycin 9/26 - off Merrem 9/27 - 10/3 PO Vancocin 10/10 >>  SIGNIFICANT EVENTS: 9/26 - admit for presumed septic shock  LINES/TUBES: OETT 7.5 9/26 >> L IJ CVL 9/26 >> Foley 9/26 >> RIJ HD 9/30 >> Rectal tube 10/6 >>  ASSESSMENT / PLAN:  PULMONARY A: Acute Hypoxic Respiratory Failure - Unable to protect airway with acute encephalopathy. Positive 11L. OHS  P:   SBT & PS wean as tolerated  Full Vent Support for rest Tracheostomy if not able to extubate Hopefully will be able to extubate after another HD session  CARDIOVASCULAR A:  Shock - Likely sepsis. Cortisol 45.1. Resolved. H/O HTN & HLD  P:  Vitals per unit  protocol Continuous telemetry monitoring Diuresis with HD  RENAL A:   Acute On Chronic Renal Failure Stage IV  - worsens without HD treatments Profound Metabolic Acidosis - Resolved w/ HD. Hyperkalemia - Resolved.  Lactic Acidosis - Resolved. Likely due to hypoperfusion in setting of Metformin.   Hyperphosphatemia - Secondary to acute renal failure. Resolved. Pseudohypocalcemia - Improving.   P:   Monitoring UOP with Foley Not a good long-term candidate for HD per Nephrology Appreciate Nephrology recommendations Trending electrolytes daily Replacing electrolytes as indicated  GASTROINTESTINAL A:   Partial SBO - Resolving on XR abd 9/30. Having BMs. H/O Ventral Hernia Repair. Possible Liver Cirrhosis. RUQ U/S normal. Ammonia WNL 10/5.  Transaminitis - Likely shock liver versus congestive hepatopathy. Improving.  Diarrhea -  Secondary to C diff.   P:   NPO Continuing Tube Feedings Pepcid VT daily Trending LFTs every other day  HEMATOLOGIC A:   Anemia - Chronic. No signs of active bleeding. S/P 1u on 10/6 & 1u on 10/11. Hgb relatively stable.. Leukocytosis - Likely sepsis. Improving.  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 Unlikely with neg SRA.  P:  Trending cell counts daily w/ CBC Transfusion goal of 7.0 SCDs Heparin [place] q8hr  INFECTIOUS A:   Severe Sepsis  C Diff Colitis - Found 10/10 & started PO Vancocin. E.coli and Klebsiella UTI - S/P 7 days Merrem. Sacral wound  P:   PO/OGT Vancomycin Day #3 Holding home Trimethoprim. Wound care consulted Plan to re-culture for fever   ENDOCRINE A:   Hypoglycemia - Resolved with Dextrose IVF. H/O DM Type 2 - A1c 5.8. H/O Hyperthyroidism - On PTU at home. TSH 0.331 & F T4 1.33 9/26. Repeat TSH 2.044 10/3.   P:   SSI per Sensitive Algorithm. NPH to 25u q12hr  Holding PTU & Metformin.  NEUROLOGIC A:   Acute Encephalopathy - Question hepatic vs toxic metabolic vs hypoxia. No improvement in Lactulose Enema despite improved  Ammonia.  Favor toxic metabolic as is gradually improving with CRRT. Repeat CT head unchanged. TSH WNL. Folate WNL. H/O CVA - Minimal mobility.  P:   RASS goal: 0 to -1 Avoid sedating meds Fentanyl IV prn pain/discomfort  MUSCULOSKELETAL A: Edema Left Arm - Improved. Secondary to IV infiltration. No gas on X-ray 9/27.  P: Continue to monitor   FAMILY  - Updates:  Daughter updated at bedside 10/9 by Dr. Ashok Cordia. Indicates she wishes to continue with hemodialysis and tracheostomy if necessary. Daughter updated 10/11 by Resident MD.  - Inter-disciplinary family meet or Palliative Care meeting due by: Continued [place] discussion 10/6 with daughter [name].   TODAY'S SUMMAR:  65 y.o. female with T2DM, CKD, recurrent UTIs 2/2 incontinence, and stroke with subsequent severe debilitation (Left sided weakness; ECOG 4), who presented with AMS and found to be in shock, likely 2/2 UTI sepsis. Some improvement in level of alertness with CRRT. Completed 7-day course of meropenem 10/3. Ultimately will need LTAC for rehab. Continuing Vancocin for C diff colitis. Probable extubation after HD today.   I have spent a total of 33 minutes of critical care time today caring for the patient and reviewing the patient's electronic medical record.   Sonia Baller Ashok Cordia, M.D. [facility] Pager:  [phone] After 3pm or if no response, call [phone] 07/06/2016, 10:01 AM

## 2016-07-06 NOTE — Progress Notes (Signed)
RN and I discussed w/ MD re: extubation plan of care.  Waiting on MD to eval pt.

## 2016-07-06 NOTE — Progress Notes (Signed)
Palliative:  Angelica Beck is undergoing dialysis at bedside when seen this morning. She continues to remain alert and plans noted for hopeful extubation today after dialysis if possible. No family at bedside. I have attempted to keep in contact with daughter, Angelica Beck, and she is friendly but not very engaging in conversation. GOC clear with full aggressive care and for trach/PEG if needed and fail extubation. Palliative will follow at a distance for now. Call for any acute palliative needs.   No Charge  [name], NP Palliative Medicine Team Pager # [phone] (M-F 8a-5p) Team Phone # [phone] (Nights/Weekends)

## 2016-07-06 NOTE — Care Management Note (Signed)
Case Management Note  Patient Details  Name: Angelica Beck MRN: [id number] Date of Birth: 18-Dec-1950  Subjective/Objective:   Pt admitted with  - pt is now on ventilator   Action/Plan:   She lives at home with her daughter where she requires full assistance, she is unable to truly ambulate and spends most of her time in a hospital bed/recliner. She relies on family members almost entirely for activities of daily living.  Pt may benefit from PT/OT eval for possible placement if family is unable to resume care at home.  CM will continue to follow pt for discharge needs   Expected Discharge Date:                  Expected Discharge Plan:  Long Term Acute Care (LTAC)  In-House Referral:  NA  Discharge planning Services  CM Consult  Post Acute Care Choice:    Choice offered to:     DME Arranged:    DME Agency:     HH Arranged:    HH Agency:     Status of Service:  In process, will continue to follow  If discussed at Long Length of Stay Meetings, dates discussed:    Additional Comments: 07/06/2016  Discussed pt in LOS 07/06/16:  Pt remains appropriate for continued stay.  Discussed pt with attending ; pt is not ready for discharge to [facility] at this time.  Plan is for possible extubation today post IHD - if pt fails extubation pt will receive trach - daughter is aware  07/05/16 Pts daughter met/toured both facilities yesterday.  CM contacted daughter this am - collective decision has been made to discharge to [facility] when medically ready.  Both facilities have been made aware of choice.  Pt remains on ventilator - with slow improvement of mental status  07/04/16 CM covering unit 10/9 - spoke in detail with Physician Advisor and it was determined that pt is appropriate for LTACH.  CM spoke with attending in detail today and he also agrees with [facility] referral - attending has already been in discussions with pt and daughter regarding post acute facility with current needs (vent/trach and  HD).  Referral given to both LTACHs 07/03/16 - both [facility] and [facility] have offered beds.  CM contacted daughter [name] and informed her or the discharge recommendation and choice between [facility] and [facility]- daughter is in agreement with possible discharge to [facility] and informal meeting with both options today ([facility] at [employer] and [facility] at [employer]) at pts bedside.  Pt is currently intubated with plans for tentative extubation/trach - still requiring IHD, needing perm HD cath placed however WBC trending up and concern for C Diff.   CM will continue to monitor for discharge needs Maryclare Labrador, RN 07/06/2016, 2:06 PM

## 2016-07-07 ENCOUNTER — Inpatient Hospital Stay (HOSPITAL_COMMUNITY): Payer: Medicare Other

## 2016-07-07 LAB — GLUCOSE, CAPILLARY
GLUCOSE-CAPILLARY: 142 mg/dL — AB (ref 65–99)
GLUCOSE-CAPILLARY: 97 mg/dL (ref 65–99)
GLUCOSE-CAPILLARY: 214 mg/dL — AB (ref 65–99)
Glucose-Capillary: 142 mg/dL — ABNORMAL HIGH (ref 65–99)
Glucose-Capillary: 151 mg/dL — ABNORMAL HIGH (ref 65–99)
Glucose-Capillary: 171 mg/dL — ABNORMAL HIGH (ref 65–99)
Glucose-Capillary: 224 mg/dL — ABNORMAL HIGH (ref 65–99)

## 2016-07-07 LAB — CBC WITH DIFFERENTIAL/PLATELET
BASOS ABS: 0 10*3/uL (ref 0.0–0.1)
BASOS PCT: 0 %
EOS ABS: 0.1 10*3/uL (ref 0.0–0.7)
EOS PCT: 1 %
HCT: 23.3 % — ABNORMAL LOW (ref 36.0–46.0)
Hemoglobin: 7.7 g/dL — ABNORMAL LOW (ref 12.0–15.0)
LYMPHS PCT: 7 %
Lymphs Abs: 1.1 10*3/uL (ref 0.7–4.0)
MCH: 28.5 pg (ref 26.0–34.0)
MCHC: 33 g/dL (ref 30.0–36.0)
MCV: 86.3 fL (ref 78.0–100.0)
Monocytes Absolute: 1.1 10*3/uL — ABNORMAL HIGH (ref 0.1–1.0)
Monocytes Relative: 7 %
Neutro Abs: 13.9 10*3/uL — ABNORMAL HIGH (ref 1.7–7.7)
Neutrophils Relative %: 85 %
PLATELETS: 274 10*3/uL (ref 150–400)
RBC: 2.7 MIL/uL — AB (ref 3.87–5.11)
RDW: 15.6 % — ABNORMAL HIGH (ref 11.5–15.5)
WBC: 16.2 10*3/uL — AB (ref 4.0–10.5)

## 2016-07-07 LAB — COMPREHENSIVE METABOLIC PANEL
ALBUMIN: 1.4 g/dL — AB (ref 3.5–5.0)
ALT: 71 U/L — ABNORMAL HIGH (ref 14–54)
ANION GAP: 11 (ref 5–15)
AST: 93 U/L — AB (ref 15–41)
Alkaline Phosphatase: 304 U/L — ABNORMAL HIGH (ref 38–126)
BUN: 71 mg/dL — AB (ref 6–20)
CHLORIDE: 100 mmol/L — AB (ref 101–111)
CO2: 24 mmol/L (ref 22–32)
Calcium: 8.7 mg/dL — ABNORMAL LOW (ref 8.9–10.3)
Creatinine, Ser: 2.94 mg/dL — ABNORMAL HIGH (ref 0.44–1.00)
GFR calc Af Amer: 18 mL/min — ABNORMAL LOW (ref >60)
GFR calc non Af Amer: 16 mL/min — ABNORMAL LOW (ref >60)
GLUCOSE: 135 mg/dL — AB (ref 65–99)
POTASSIUM: 3.7 mmol/L (ref 3.5–5.1)
SODIUM: 135 mmol/L (ref 135–145)
Total Bilirubin: 0.5 mg/dL (ref 0.3–1.2)
Total Protein: 5.5 g/dL — ABNORMAL LOW (ref 6.5–8.1)

## 2016-07-07 LAB — PHOSPHORUS: Phosphorus: 3 mg/dL (ref 2.5–4.6)

## 2016-07-07 LAB — MAGNESIUM: MAGNESIUM: 1.8 mg/dL (ref 1.7–2.4)

## 2016-07-07 MED ORDER — ADULT MULTIVITAMIN LIQUID CH
15.0000 mL | Freq: Every day | ORAL | Status: DC
  Administered 2016-07-07 – 2016-07-18 (×10): 15 mL via ORAL
  Filled 2016-07-07 (×13): qty 15

## 2016-07-07 MED ORDER — INSULIN NPH (HUMAN) (ISOPHANE) 100 UNIT/ML ~~LOC~~ SUSP: 15.0000 [IU] | Freq: Two times a day (BID) | SUBCUTANEOUS | Status: DC

## 2016-07-07 NOTE — Progress Notes (Signed)
PULMONARY / CRITICAL CARE MEDICINE   Name: Angelica Beck MRN: [id number] DOB: 01-23-1951    ADMISSION DATE:  06/20/2016 CONSULTATION DATE:  06/20/2016  REFERRING MD:  EDP Dr Cathleen Fears   CHIEF COMPLAINT:  AMS  HISTORY OF PRESENT ILLNESS:   65 year old female with past medical history as below, which is significant for stroke with subsequent debilitation, diabetes, chronic kidney disease, chronic urinary tract infections due to incontinence, for which she takes chronic antibiotics. She lives at home with her daughter where she requires full assistance she is unable to truly ambulate and spends most of her time in a hospital bed/recliner. She relies on family members almost entirely for activities of daily living. She was in her usual state of health until about 9/23 when she developed nausea and vomiting. This persisted for 2 days until symptoms subsided, however, she continued to complain of abdominal pain. 9/26 when the patient's daughter got home from work the patient was found to be unresponsive. EMS was called and she was found hypoglycemic which was corrected, however, altered mental status did not improve. Upon arrival to the emergency department she continued to be lethargic and was found to be hypotensive with hypoxemia. Laboratory evaluation significant for potassium 7.4, bicarbonate less than 7, creatinine 6.51, BUN 95, WBC 12.3, lactic acid 13.88, pH 7.07.  SUBJECTIVE:   Continues to be alert. Indicates no pain by shaking head.  Leukocytosis coming down  Afebrile overnight Net positive 4.7 1.8 fluids removed 10/12  VITAL SIGNS: BP 131/74   Pulse 89   Temp 98.2 F (36.8 C) (Oral)   Resp 20   Ht '5\' 6"'$  (1.676 m)   Wt 105.1 kg (231 lb 11.3 oz)   SpO2 99%   BMI 37.40 kg/m   HEMODYNAMICS:    VENTILATOR SETTINGS: Vent Mode: PRVC FiO2 (%):  [40 %] 40 % Set Rate:  [20 bmp] 20 bmp Vt Set:  [470 mL] 470 mL PEEP:  [5 cmH20] 5 cmH20 Pressure Support:  [0 cmH20] 0  cmH20 Plateau Pressure:  [20 cmH20] 20 cmH20  INTAKE / OUTPUT: I/O last 3 completed shifts: In: 2140 [I.V.:300; NG/GT:1840] Out: 2140 [Other:1838; Stool:302]  PHYSICAL EXAMINATION: General:  Obese female. Chronically ill appearing. Neuro: Opens eyes to voice, tracks, nods, squeezes hand and wiggles toes but with decreased strength (R > L) HEENT:  No scleral icterus or injection. ETT in place. Cardiovascular:  Regular rhythm and rate. 1-2+ LE edema bilaterally to knee but with puffy hands and feet (2+). Unable to appreciate JVD. Lungs: CTAB. Symmetric chest rise on ventilator.  Abdomen:  Soft. Protuberant. + BS. NT.  Musculoskeletal:  No joint deformity or effusion. No crepitus. Skin:  Warm & dry. Sacral sloughing.   LABS:  BMET  Recent Labs Lab 07/05/16 0345 07/06/16 0400 07/07/16 0436  NA 135 135 135  K 3.8 4.0 3.7  CL 98* 98* 100*  CO2 '25 24 24  '$ BUN 92* 125* 71*  CREATININE 3.46* 4.29* 2.94*  GLUCOSE 231* 176* 135*    Electrolytes  Recent Labs Lab 07/05/16 0345 07/06/16 0400 07/07/16 0436  CALCIUM 8.9 9.0 8.7*  MG 1.6* 2.2 1.8  PHOS 2.6 3.3 3.0    CBC  Recent Labs Lab 07/05/16 0345 07/06/16 0400 07/07/16 0436  WBC 31.1* 25.5* 16.2*  HGB 8.1* 7.7* 7.7*  HCT 24.4* 23.2* 23.3*  PLT 236 258 274    Coag's No results for input(s): APTT, INR in the last 168 hours.  Sepsis Markers No results for input(s): LATICACIDVEN, PROCALCITON,  O2SATVEN in the last 168 hours.  ABG No results for input(s): PHART, PCO2ART, PO2ART in the last 168 hours.  Liver Enzymes  Recent Labs Lab 07/03/16 0430  07/05/16 0345 07/06/16 0400 07/07/16 0436  AST 450*  --  145*  --  93*  ALT 208*  --  99*  --  71*  ALKPHOS 480*  --  360*  --  304*  BILITOT 0.5  --  0.7  --  0.5  ALBUMIN 1.6*  < > 1.5* 1.4* 1.4*  < > = values in this interval not displayed.  Cardiac Enzymes No results for input(s): TROPONINI, PROBNP in the last 168 hours.  Glucose  Recent Labs Lab  07/06/16 0401 07/06/16 0746 07/06/16 1157 07/06/16 1613 07/06/16 2344 07/07/16 0407  GLUCAP 166* 211* 131* 141* 173* 142*    Imaging No results found.  STUDIES:  CT Head 9/27:  No acute abnormality. Chronic small vessel ischemic changes. Chronic lacunar infarct left basal ganglia & chronic ischemia right side of pons.  CT Abd/Pelvis w/o 9/27:  Early partial SBO w/ transition point LLQ with adhesions. Left humerus soft tissue swelling & intramuscular gas. Punctate hepatic granulomas noted.  Left Arm X-ray 9/27:  Diffuse soft tissue edema.  TTE 9/27:  LV w/ EF 60-65%. Grade 1 Diastolic dysfunction. No regional wall motion abnormalities. LA & RA normal in size. RV normal in size & function. Mild AS w/o AR. Mild MR w/o MS.  ABIs 10/3: R normal. L posterior artery noncompressible; suspect calcification.  CT head 10/3: No acute infarct, no hemorrhage or mass. Atrophy evident. Similar to prior study.   MICROBIOLOGY: MRSA PCR 9/27:  Negative Blood Ctx x 2 9/26 >> Coag Neg Staph 1/2 Urine Ctx 9/26:  5000 CFU E coli & 1000 CFU Klebsiella  C diff PCR 10/10: antigen and toxin positive   ANTIBIOTICS: Levaquin 9/26 - 9/28 Aztreonam 9/26 - 9/27 Vancomycin 9/26 > off Merrem 9/27 > 10/3 PO Vanc 10/10 >>  SIGNIFICANT EVENTS: 9/26 - admit for presumed septic shock  LINES/TUBES: OETT 7.5 9/26 >> L IJ CVL 9/26 >> Foley 9/26 >> 10/8 RIJ HD 9/30 >> Rectal tube 10/6 >>  DISCUSSION: Angelica Beck is a 65-y/o female with T2DM, CKD, recurrent UTIs 2/2 incontinence, and stroke with subsequent severe debilitation (Left sided weakness; ECOG 4), who presented with AMS and found to be in shock, likely 2/2 UTI sepsis. Some improvement in level of alertness with CRRT. Completed 7-day course of meropenem 10/3. Has not been making urine after CRRT so received iHD 10/5. Making slow improvements in level of alertness but unclear that she can successfully extubate.  Concern remains for ability to regain  former function given reliance on hemodialysis and continued severe weakness. Suspect that we will need to consider extubation when we believe she is optimized, acknowledge that if she fails we would make her comfortable. Scenario complicated by the fact that hepatic and renal injuries have not resolved either. Daughter wanting aggressive care. To pursue continued HD through perm cath once leukocytosis improves. Plan to extubate 10/13 after HD.   ASSESSMENT / PLAN:  PULMONARY A: Acute Hypoxic Respiratory Failure - Unable to protect airway with acute encephalopathy.  Obesity hypoventilation syndrome VDRF P:   SBT as tolerated. Plan for extubation 10/13; Dr. Ashok Cordia discussed with daughter possibility of needing tracheostomy if she fails, and daughter agreed Oral care for PNA prophylaxis  CXR intermittently.  CARDIOVASCULAR A:  Shock - Likely sepsis. Cortisol 45.1. Resolved. H/O HTN & HLD  P:  Monitor hemodynamics.  RENAL A:   Acute On Chronic Renal Failure Stage IV  - worsens without HD treatments Profound Metabolic Acidosis  Hyperkalemia - Resolved.  Lactic Acidosis, suspect due to hypoperfusion with possible contribution of renal injury 2/2 metformin, persisting likely due to poor renal clearance.  Resolved 10/2 after CRRT. Hyperphosphatemia - Secondary to acute renal failure. Resolved. Pseudohypocalcemia - Improving.  Up 4.8 L this admission P:   To pursue continued HD Consult [facility]VS about perm cath placement once leukocytosis resolved  GASTROINTESTINAL A:   Partial SBO - Resolved, having BMs. H/O Ventral Hernia Repair. 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 RUQ U/S normal. Ammonia WNL 10/5.  Transaminitis improving, suspect shock liver from poor renal clearance.  P:   Continue tube feeds. Protonix per tube CMP 10/15 (every other day)  HEMATOLOGIC A:   Anemia - Chronic. Oozing wounds around mouth. Hgb stable at 7.7 (10/13) Leukocytosis - Improved (16.2 < 25.5 < 31.1) HIT ab  inconclusive. SRA negative.  P:  SCD's / aspirin. Plavix held SQ heparin TID S/p 2 u transfusion 10/6 and 10/10 Daily CBCs  Transfusion goal of 7.0.  aranesp per renal  INFECTIOUS A:   Severe Sepsis - due to E.coli and Klebsiella UTI. Unlikely Coag Neg Staph Bacteremia, suspect contaminant Leukocytosis rising again Sacral wound C diff positive P:   PO vancomycin, Day #4, to end 10/23 for 14-day course of treatment Completed course of meropenem Following cultures to completion. Holding home Trimethoprim. Wound care consulted, appreciate recommendations of using foam dressings Rectal tube in place  ENDOCRINE A:   Hypoglycemia - Resolved with Dextrose IVF. H/O DM Type 2 - A1c 5.8. H/O Hyperthyroidism - On PTU at home. TSH 0.331 & F T4 1.33 9/26. Repeat TSH 2.044 10/3. Now TSH 0.554 and F T4 1.30.  CBGs under 180 except for 1 value, 9 U novolog given P:   NPH 25 BID Holding PTU & Metformin. Consider starting methimazole instead if TSH decreases further, as PTU can cause hepatotoxicity. Recheck TSH weekly  NEUROLOGIC A:   Acute Encephalopathy - Question hepatic vs toxic metabolic vs hypoxia. No improvement in Lactulose Enema despite improved Ammonia.  Favor toxic metabolic as is gradually improving with CRRT. Repeat CT head unchanged. TSH WNL. Folate WNL. H/O CVA - Minimal mobility. P:   RASS goal: 0 to -1 Avoid sedating meds.  MUSCULOSKELETAL A: Edema Left Arm - Improved. Secondary to IV infiltration. No gas on X-ray 9/27. P: Continue to monitor   FAMILY  - Updates: Spoke with daughter 10/12. She is okay with extubation proceeding if she is not present.  - Inter-disciplinary family meet or Palliative Care meeting due by: Performed 10/6, 10/7, to continue.   Olene Floss, MD  [place], PGY-2

## 2016-07-07 NOTE — Progress Notes (Signed)
PULMONARY / CRITICAL CARE MEDICINE   Name: Angelica Beck MRN: [id number] DOB: May 14, 1951    ADMISSION DATE:  06/20/2016 CONSULTATION DATE:  06/20/2016  REFERRING MD:  EDP Dr Cathleen Fears   CHIEF COMPLAINT:  AMS  HISTORY OF PRESENT ILLNESS:   65 year old female with past medical history as below, which is significant for stroke with subsequent debilitation, diabetes, chronic kidney disease, chronic urinary tract infections due to incontinence, for which she takes chronic antibiotics. She lives at home with her daughter where she requires full assistance she is unable to truly ambulate and spends most of her time in a hospital bed/recliner. She relies on family members almost entirely for activities of daily living. She was in her usual state of health until about 9/23 when she developed nausea and vomiting. This persisted for 2 days until symptoms subsided, however, she continued to complain of abdominal pain. 9/26 when the patient's daughter got home from work the patient was found to be unresponsive. EMS was called and she was found hypoglycemic which was corrected, however, altered mental status did not improve. Upon arrival to the emergency department she continued to be lethargic and was found to be hypotensive with hypoxemia. Laboratory evaluation significant for potassium 7.4, bicarbonate less than 7, creatinine 6.51, BUN 95, WBC 12.3, lactic acid 13.88, pH 7.07.  SUBJECTIVE:  Underwent HD yesterday. No events overnight. Tube feeds held overnight with borderline blood glucose.   REVIEW OF SYSTEMS:  Unable to obtain with intubation.   VITAL SIGNS: BP 133/76   Pulse 96   Temp 98.7 F (37.1 C) (Oral)   Resp (!) 24   Ht '5\' 6"'$  (1.676 m)   Wt 231 lb 11.3 oz (105.1 kg)   SpO2 100%   BMI 37.40 kg/m   HEMODYNAMICS:    VENTILATOR SETTINGS: Vent Mode: PSV;CPAP FiO2 (%):  [40 %] 40 % Set Rate:  [20 bmp] 20 bmp Vt Set:  [470 mL] 470 mL PEEP:  [5 cmH20] 5 cmH20 Pressure Support:  [0  cmH20-5 cmH20] 5 cmH20 Plateau Pressure:  [20 cmH20] 20 cmH20  INTAKE / OUTPUT: I/O last 3 completed shifts: In: 2140 [I.V.:300; NG/GT:1840] Out: 2140 [Other:1838; Stool:302]  PHYSICAL EXAMINATION: General:  Obese female. No distress. No family at bedside. Neuro: Nods appropriately. Tracks to voice. Following commands. HEENT:  No scleral icterus or injection. ETT in place. Protruding tongue. Cardiovascular:  Regular rhythm. Ansarca persists and is unchanged. Lungs:  Improving bilateral aeration & clear breath sounds.  Symmetric chest wall rise on ventilator.   Abdomen:  Soft. Protuberant. Normoactive bowel sounds. Musculoskeletal:  No joint deformity or effusion. Lifting head off of bed. Skin:  Warm & dry. Sacral sloughing.   LABS:  BMET  Recent Labs Lab 07/05/16 0345 07/06/16 0400 07/07/16 0436  NA 135 135 135  K 3.8 4.0 3.7  CL 98* 98* 100*  CO2 '25 24 24  '$ BUN 92* 125* 71*  CREATININE 3.46* 4.29* 2.94*  GLUCOSE 231* 176* 135*    Electrolytes  Recent Labs Lab 07/05/16 0345 07/06/16 0400 07/07/16 0436  CALCIUM 8.9 9.0 8.7*  MG 1.6* 2.2 1.8  PHOS 2.6 3.3 3.0    CBC  Recent Labs Lab 07/05/16 0345 07/06/16 0400 07/07/16 0436  WBC 31.1* 25.5* 16.2*  HGB 8.1* 7.7* 7.7*  HCT 24.4* 23.2* 23.3*  PLT 236 258 274    Coag's No results for input(s): APTT, INR in the last 168 hours.  Sepsis Markers No results for input(s): LATICACIDVEN, PROCALCITON, O2SATVEN in the last 168  hours.  ABG No results for input(s): PHART, PCO2ART, PO2ART in the last 168 hours.  Liver Enzymes  Recent Labs Lab 07/03/16 0430  07/05/16 0345 07/06/16 0400 07/07/16 0436  AST 450*  --  145*  --  93*  ALT 208*  --  99*  --  71*  ALKPHOS 480*  --  360*  --  304*  BILITOT 0.5  --  0.7  --  0.5  ALBUMIN 1.6*  < > 1.5* 1.4* 1.4*  < > = values in this interval not displayed.  Cardiac Enzymes No results for input(s): TROPONINI, PROBNP in the last 168 hours.  Glucose  Recent  Labs Lab 07/06/16 0746 07/06/16 1157 07/06/16 1613 07/06/16 2344 07/07/16 0407 07/07/16 0800  GLUCAP 211* 131* 141* 173* 142* 97    Imaging No results found.  STUDIES:  CT Head 9/27:  No acute abnormality. Chronic small vessel ischemic changes. Chronic lacunar infarct left basal ganglia & chronic ischemia right side of pons.  CT Abd/Pelvis w/o 9/27:  Early partial SBO w/ transition point LLQ with adhesions. Left humerus soft tissue swelling & intramuscular gas. Punctate hepatic granulomas noted.  Left Arm X-ray 9/27:  Diffuse soft tissue edema.  TTE 9/27:  LV w/ EF 60-65%. Grade 1 Diastolic dysfunction. No regional wall motion abnormalities. LA & RA normal in size. RV normal in size & function. Mild AS w/o AR. Mild MR w/o MS.  ABIs 10/3: R normal. L posterior artery noncompressible; suspect calcification.  CT head 10/3: No acute infarct, no hemorrhage or mass. Atrophy evident. Similar to prior study.   MICROBIOLOGY: MRSA PCR 9/27:  Negative Blood Ctx x 2 9/26: Coag Neg Staph 1/2 Urine Ctx 9/26:  5000 CFU E coli & 1000 CFU Klebsiella  C diff PCR 10/10:  Toxin & Antigen Positive  ANTIBIOTICS: Levaquin 9/26 - 9/28 Aztreonam 9/26 - 9/27 Vancomycin 9/26 - off Merrem 9/27 - 10/3 PO Vancocin 10/10 >>  SIGNIFICANT EVENTS: 9/26 - admit for presumed septic shock  LINES/TUBES: OETT 7.5 9/26 >> L IJ CVL 9/26 >> Foley 9/26 >> RIJ HD 9/30 >>  ASSESSMENT / PLAN:  PULMONARY A: Acute Hypoxic Respiratory Failure - Unable to protect airway with acute encephalopathy. Positive 11L. OHS  P:   Plan for extubation today Incentive Spirometry post extubation Weaning FiO2 for sat >92%  CARDIOVASCULAR A:  Shock - Likely sepsis. Cortisol 45.1. Resolved. H/O HTN & HLD  P:  Vitals per unit protocol Continuous telemetry monitoring Diuresis with HD  RENAL A:   Acute On Chronic Renal Failure Stage IV  - worsens without HD treatments Profound Metabolic Acidosis - Resolved w/  HD. Hyperkalemia - Resolved.  Lactic Acidosis - Resolved. Likely due to hypoperfusion in setting of Metformin.   Hyperphosphatemia - Secondary to acute renal failure. Resolved. Pseudohypocalcemia - Improving.   P:   Monitoring UOP with Foley Not a good long-term candidate for HD per Nephrology Appreciate Nephrology recommendations Trending electrolytes daily Replacing electrolytes as indicated Plan for perm-a-cath placement on Monday Plan to remove Temp HD Catheter on Saturday after HD  GASTROINTESTINAL A:   Partial SBO - Resolving on XR abd 9/30. Having BMs. H/O Ventral Hernia Repair. Possible Liver Cirrhosis. RUQ U/S normal. Ammonia WNL 10/5.  Transaminitis - Likely shock liver versus congestive hepatopathy. Improving.  Diarrhea - Secondary to C diff.   P:   NPO Holding Tube Feedings Pepcid VT daily Trending LFTs Intermittently   HEMATOLOGIC A:   Anemia - Chronic. No signs of active  bleeding. S/P 1u on 10/6 & 1u on 10/11. Hgb relatively stable.. Leukocytosis - Likely sepsis. Improving.  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 Unlikely with neg SRA. Thrombocytopenia - Resolved.  P:  Trending cell counts daily w/ CBC Transfusion goal of 7.0 SCDs Heparin [place] q8hr  INFECTIOUS A:   Severe Sepsis  C Diff Colitis - Found 10/10 & started PO Vancocin. E.coli and Klebsiella UTI - S/P 7 days Merrem. Sacral wound  P:   PO/OGT Vancomycin Day #4 Holding home Trimethoprim. Wound care consulted Plan to re-culture for fever   ENDOCRINE A:   Hypoglycemia - Resolved with Dextrose IVF. H/O DM Type 2 - A1c 5.8. H/O Hyperthyroidism - On PTU at home. TSH 0.331 & F T4 1.33 9/26. Repeat TSH 2.044 10/3.   P:   SSI per Sensitive Algorithm. Holding NPH with tube feedings off Holding PTU & Metformin. Repeat TSH & Free T4 in AM  NEUROLOGIC A:   Acute Encephalopathy - Question hepatic vs toxic metabolic vs hypoxia. No improvement in Lactulose Enema despite improved Ammonia.  Favor toxic  metabolic as is gradually improving with CRRT. Repeat CT head unchanged. TSH WNL. Folate WNL. H/O CVA - Minimal mobility.  P:   RASS goal: 0 to -1 Avoid sedating meds Fentanyl IV prn pain/discomfort  MUSCULOSKELETAL A: Edema Left Arm - Improved. Secondary to IV infiltration. No gas on X-ray 9/27.  P: Continue to monitor   FAMILY  - Updates:  Daughter updated at bedside 10/9 by Dr. Ashok Cordia. Indicates she wishes to continue with hemodialysis and tracheostomy if necessary. Daughter updated 10/11 by Resident MD.  - Inter-disciplinary family meet or Palliative Care meeting due by: Continued [place] discussion 10/6 with daughter [name].   TODAY'S SUMMAR:  65 y.o. female with T2DM, CKD, recurrent UTIs 2/2 incontinence, and stroke with subsequent severe debilitation (Left sided weakness; ECOG 4), who presented with AMS and found to be in shock, likely 2/2 UTI sepsis. Some improvement in level of alertness with CRRT. Completed 7-day course of meropenem 10/3. Plan for extubation today around 11am. Will plan for NGT placement for feedings and speech eval post extubation if she doesn't require re-intubation.   I have spent a total of 34 minutes of critical care time today caring for the patient and reviewing the patient's electronic medical record.   Sonia Baller Ashok Cordia, M.D. [facility] Pager:  [phone] After 3pm or if no response, call [phone] 07/07/2016, 9:47 AM

## 2016-07-07 NOTE — Progress Notes (Signed)
S: intubated.   O:BP 131/74   Pulse 89   Temp 98.2 F (36.8 C) (Oral)   Resp 20   Ht '5\' 6"'  (1.676 m)   Wt 105.1 kg (231 lb 11.3 oz)   SpO2 99%   BMI 37.40 kg/m   Intake/Output Summary (Last 24 hours) at 07/07/16 0805 Last data filed at 07/07/16 0200  Gross per 24 hour  Intake             1230 ml  Output             1840 ml  Net             -610 ml   Weight change: -2.6 kg (-5 lb 11.7 oz) SWF:UXNATFTDD but opens eyes CVS:RRR Resp:clear ant Abd: +BS NTND Ext: tr edema NEURO: eyes opened, unable to move extremeties Rt IJ HD cath temp Lt IJ triple lumen   . aspirin  162 mg Per Tube Daily  . chlorhexidine gluconate (MEDLINE KIT)  15 mL Mouth Rinse BID  . darbepoetin (ARANESP) injection - DIALYSIS  100 mcg Intravenous Q Mon-HD  . famotidine  20 mg Per Tube Daily  . feeding supplement (VITAL HIGH PROTEIN)  1,000 mL Per Tube Q24H  . heparin subcutaneous  5,000 Units Subcutaneous Q8H  . Influenza vac split quadrivalent PF  0.5 mL Intramuscular Tomorrow-1000  . insulin aspart  0-9 Units Subcutaneous Q4H  . insulin NPH Human  25 Units Subcutaneous BID AC & HS  . mouth rinse  15 mL Mouth Rinse QID  . pneumococcal 23 valent vaccine  0.5 mL Intramuscular Tomorrow-1000  . vancomycin  500 mg Oral Q6H   No results found. BMET    Component Value Date/Time   NA 135 07/07/2016 0436   K 3.7 07/07/2016 0436   CL 100 (L) 07/07/2016 0436   CO2 24 07/07/2016 0436   GLUCOSE 135 (H) 07/07/2016 0436   BUN 71 (H) 07/07/2016 0436   CREATININE 2.94 (H) 07/07/2016 0436   CALCIUM 8.7 (L) 07/07/2016 0436   GFRNONAA 16 (L) 07/07/2016 0436   GFRAA 18 (L) 07/07/2016 0436   CBC    Component Value Date/Time   WBC 16.2 (H) 07/07/2016 0436   RBC 2.70 (L) 07/07/2016 0436   HGB 7.7 (L) 07/07/2016 0436   HCT 23.3 (L) 07/07/2016 0436   HCT 26.8 (L) 06/27/2016 1536   PLT 274 07/07/2016 0436   MCV 86.3 07/07/2016 0436   MCH 28.5 07/07/2016 0436   MCHC 33.0 07/07/2016 0436   RDW 15.6 (H)  07/07/2016 0436   LYMPHSABS 1.1 07/07/2016 0436   MONOABS 1.1 (H) 07/07/2016 0436   EOSABS 0.1 07/07/2016 0436   BASOSABS 0.0 07/07/2016 0436     Assessment:  1. Acute on CKD 3 secondary to ATN from shock/sepsis.  Still with minimal UO.  Getting HD TTS 2. VDRF 3 Anemia on aranesp 4. ? HIT, Plt better 5. C diff colitis on oral vanco  Plan: 1. HD tomorrow.  (Dr Lorrene Reid met with daughter and plans are to cont HD).  2.  Will need perm cath placed when CCM feels it is safe 3.  Would check CXR, discussed with ICU resident 4. Start MVI    Jerardo Costabile T

## 2016-07-07 NOTE — Progress Notes (Signed)
PCCM Attending SBT Note: Patient transitioned to SBT PS 0/5 FiO2 0.4 with RR around 25-26 at 10am. After 1 hour patient had significantly increased work of breathing and hypertension with tachycardia despite reporting she was breathing fine. Now back on PS 5/5 with still increased work of breathing. Do not believe it is safe to extubate her given her potential for reintubation & fatigue. Plan to re-attempt SBT tomorrow after HD and if still intolerant of SBT likely will need tracheostomy early the week of 10/16.  Sonia Baller Ashok Cordia, M.D. [facility] Pager:  [phone] After 3pm or if no response, call [phone] 11:30 AM 07/07/16

## 2016-07-07 NOTE — Progress Notes (Signed)
[id number] Radiology called to inform chest xray results. Malposition of right IJ central venous catheter. 65 Dr. Ashok Cordia critical care physician informed of possible findings. Per CCM physician no further instructions at this time, okay to continue using central venous catheter.

## 2016-07-08 DIAGNOSIS — E0590 Thyrotoxicosis, unspecified without thyrotoxic crisis or storm: Secondary | ICD-10-CM

## 2016-07-08 LAB — CBC WITH DIFFERENTIAL/PLATELET
BASOS PCT: 0 %
Basophils Absolute: 0 10*3/uL (ref 0.0–0.1)
EOS ABS: 0.1 10*3/uL (ref 0.0–0.7)
EOS PCT: 1 %
HCT: 23.6 % — ABNORMAL LOW (ref 36.0–46.0)
Hemoglobin: 7.9 g/dL — ABNORMAL LOW (ref 12.0–15.0)
LYMPHS ABS: 1 10*3/uL (ref 0.7–4.0)
Lymphocytes Relative: 9 %
MCH: 28.8 pg (ref 26.0–34.0)
MCHC: 33.5 g/dL (ref 30.0–36.0)
MCV: 86.1 fL (ref 78.0–100.0)
MONO ABS: 0.9 10*3/uL (ref 0.1–1.0)
Monocytes Relative: 8 %
NEUTROS ABS: 9.3 10*3/uL — AB (ref 1.7–7.7)
Neutrophils Relative %: 82 %
PLATELETS: 323 10*3/uL (ref 150–400)
RBC: 2.74 MIL/uL — ABNORMAL LOW (ref 3.87–5.11)
RDW: 15.7 % — AB (ref 11.5–15.5)
WBC: 11.3 10*3/uL — ABNORMAL HIGH (ref 4.0–10.5)

## 2016-07-08 LAB — POCT I-STAT 3, ART BLOOD GAS (G3+)
Acid-Base Excess: 2 mmol/L (ref 0.0–2.0)
BICARBONATE: 26.1 mmol/L (ref 20.0–28.0)
O2 Saturation: 98 %
PO2 ART: 104 mmHg (ref 83.0–108.0)
TCO2: 27 mmol/L (ref 0–100)
pCO2 arterial: 37.9 mmHg (ref 32.0–48.0)
pH, Arterial: 7.445 (ref 7.350–7.450)

## 2016-07-08 LAB — RENAL FUNCTION PANEL
Albumin: 1.3 g/dL — ABNORMAL LOW (ref 3.5–5.0)
Anion gap: 12 (ref 5–15)
BUN: 101 mg/dL — AB (ref 6–20)
CHLORIDE: 98 mmol/L — AB (ref 101–111)
CO2: 22 mmol/L (ref 22–32)
Calcium: 8.8 mg/dL — ABNORMAL LOW (ref 8.9–10.3)
Creatinine, Ser: 3.64 mg/dL — ABNORMAL HIGH (ref 0.44–1.00)
GFR calc Af Amer: 14 mL/min — ABNORMAL LOW (ref >60)
GFR, EST NON AFRICAN AMERICAN: 12 mL/min — AB (ref >60)
Glucose, Bld: 263 mg/dL — ABNORMAL HIGH (ref 65–99)
POTASSIUM: 3.8 mmol/L (ref 3.5–5.1)
Phosphorus: 4.6 mg/dL (ref 2.5–4.6)
Sodium: 132 mmol/L — ABNORMAL LOW (ref 135–145)

## 2016-07-08 LAB — GLUCOSE, CAPILLARY
GLUCOSE-CAPILLARY: 261 mg/dL — AB (ref 65–99)
GLUCOSE-CAPILLARY: 176 mg/dL — AB (ref 65–99)
GLUCOSE-CAPILLARY: 175 mg/dL — AB (ref 65–99)
Glucose-Capillary: 237 mg/dL — ABNORMAL HIGH (ref 65–99)
Glucose-Capillary: 200 mg/dL — ABNORMAL HIGH (ref 65–99)

## 2016-07-08 LAB — TSH: TSH: 0.458 u[IU]/mL (ref 0.350–4.500)

## 2016-07-08 LAB — T4, FREE: Free T4: 1.78 ng/dL — ABNORMAL HIGH (ref 0.61–1.12)

## 2016-07-08 LAB — MAGNESIUM: Magnesium: 2 mg/dL (ref 1.7–2.4)

## 2016-07-08 MED ORDER — METHIMAZOLE 5 MG PO TABS
5.0000 mg | ORAL_TABLET | Freq: Every day | ORAL | Status: DC
  Administered 2016-07-11 – 2016-07-13 (×3): 5 mg
  Filled 2016-07-08 (×8): qty 1

## 2016-07-08 NOTE — Progress Notes (Signed)
PULMONARY / CRITICAL CARE MEDICINE   Name: Angelica Beck MRN: [id number] DOB: 1950/11/10    ADMISSION DATE:  06/20/2016 CONSULTATION DATE:  06/20/2016  REFERRING MD:  EDP Dr Cathleen Fears   CHIEF COMPLAINT:  AMS  HISTORY OF PRESENT ILLNESS:   65 year old female with past medical history as below, which is significant for stroke with subsequent debilitation, diabetes, chronic kidney disease, chronic urinary tract infections due to incontinence, for which she takes chronic antibiotics. She lives at home with her daughter where she requires full assistance she is unable to truly ambulate and spends most of her time in a hospital bed/recliner. She relies on family members almost entirely for activities of daily living. She was in her usual state of health until about 9/23 when she developed nausea and vomiting. This persisted for 2 days until symptoms subsided, however, she continued to complain of abdominal pain. 9/26 when the patient's daughter got home from work the patient was found to be unresponsive. EMS was called and she was found hypoglycemic which was corrected, however, altered mental status did not improve. Upon arrival to the emergency department she continued to be lethargic and was found to be hypotensive with hypoxemia. Laboratory evaluation significant for potassium 7.4, bicarbonate less than 7, creatinine 6.51, BUN 95, WBC 12.3, lactic acid 13.88, pH 7.07.  SUBJECTIVE:  Patient underwent HD today. Patient denies any pain or difficulty breathing.   REVIEW OF SYSTEMS:  Unable to obtain with intubation.   VITAL SIGNS: BP 118/69 (BP Location: Right Arm)   Pulse (!) 103   Temp 97.6 F (36.4 C) (Oral)   Resp (!) 25   Ht '5\' 6"'$  (1.676 m)   Wt 231 lb 7.7 oz (105 kg)   SpO2 99%   BMI 37.36 kg/m   HEMODYNAMICS:    VENTILATOR SETTINGS: Vent Mode: PRVC FiO2 (%):  [40 %] 40 % Set Rate:  [20 bmp] 20 bmp Vt Set:  [470 mL] 470 mL PEEP:  [5 cmH20] 5 cmH20 Plateau Pressure:  [15  cmH20-21 cmH20] 15 cmH20  INTAKE / OUTPUT: I/O last 3 completed shifts: In: 2368 [I.V.:300; NG/GT:2068] Out: -   PHYSICAL EXAMINATION: General:  Obese female. No distress. Watching TV. Neuro: Nods appropriately. Tracks to voice. Following commands. HEENT:  No scleral icterus or injection. ETT in place. Tongue protrusion improved. Cardiovascular:  Regular rhythm. Ansarca persists and is decreasing. Lungs:  Improving bilateral aeration & clear breath sounds.  Symmetric chest wall rise on ventilator.   Abdomen:  Soft. Protuberant. Normoactive bowel sounds. Musculoskeletal:  No joint deformity or effusion. Lifting head off of bed. Skin:  Warm & dry. No rash on exposed skin.  LABS:  BMET  Recent Labs Lab 07/06/16 0400 07/07/16 0436 07/08/16 0458  NA 135 135 132*  K 4.0 3.7 3.8  CL 98* 100* 98*  CO2 '24 24 22  '$ BUN 125* 71* 101*  CREATININE 4.29* 2.94* 3.64*  GLUCOSE 176* 135* 263*    Electrolytes  Recent Labs Lab 07/06/16 0400 07/07/16 0436 07/08/16 0458  CALCIUM 9.0 8.7* 8.8*  MG 2.2 1.8 2.0  PHOS 3.3 3.0 4.6    CBC  Recent Labs Lab 07/06/16 0400 07/07/16 0436 07/08/16 0458  WBC 25.5* 16.2* 11.3*  HGB 7.7* 7.7* 7.9*  HCT 23.2* 23.3* 23.6*  PLT 258 274 323    Coag's No results for input(s): APTT, INR in the last 168 hours.  Sepsis Markers No results for input(s): LATICACIDVEN, PROCALCITON, O2SATVEN in the last 168 hours.  ABG No results  for input(s): PHART, PCO2ART, PO2ART in the last 168 hours.  Liver Enzymes  Recent Labs Lab 07/03/16 0430  07/05/16 0345 07/06/16 0400 07/07/16 0436 07/08/16 0458  AST 450*  --  145*  --  93*  --   ALT 208*  --  99*  --  71*  --   ALKPHOS 480*  --  360*  --  304*  --   BILITOT 0.5  --  0.7  --  0.5  --   ALBUMIN 1.6*  < > 1.5* 1.4* 1.4* 1.3*  < > = values in this interval not displayed.  Cardiac Enzymes No results for input(s): TROPONINI, PROBNP in the last 168 hours.  Glucose  Recent Labs Lab  07/07/16 1533 07/07/16 2021 07/07/16 2347 07/08/16 0354 07/08/16 0800 07/08/16 1200  GLUCAP 171* 214* 224* 237* 261* 200*    Imaging No results found.  STUDIES:  CT Head 9/27:  No acute abnormality. Chronic small vessel ischemic changes. Chronic lacunar infarct left basal ganglia & chronic ischemia right side of pons.  CT Abd/Pelvis w/o 9/27:  Early partial SBO w/ transition point LLQ with adhesions. Left humerus soft tissue swelling & intramuscular gas. Punctate hepatic granulomas noted.  Left Arm X-ray 9/27:  Diffuse soft tissue edema.  TTE 9/27:  LV w/ EF 60-65%. Grade 1 Diastolic dysfunction. No regional wall motion abnormalities. LA & RA normal in size. RV normal in size & function. Mild AS w/o AR. Mild MR w/o MS.  ABIs 10/3: R normal. L posterior artery noncompressible; suspect calcification.  CT head 10/3: No acute infarct, no hemorrhage or mass. Atrophy evident. Similar to prior study.   MICROBIOLOGY: MRSA PCR 9/27:  Negative Blood Ctx x 2 9/26: Coag Neg Staph 1/2 Urine Ctx 9/26:  5000 CFU E coli & 1000 CFU Klebsiella  C diff PCR 10/10:  Toxin & Antigen Positive  ANTIBIOTICS: Levaquin 9/26 - 9/28 Aztreonam 9/26 - 9/27 Vancomycin 9/26 - off Merrem 9/27 - 10/3 PO Vancocin 10/10 >>  SIGNIFICANT EVENTS: 9/26 - admit for presumed septic shock  LINES/TUBES: OETT 7.5 9/26 >> L IJ CVL 9/26 >> Foley 9/26 >> RIJ HD 9/30 >>  ASSESSMENT / PLAN:  PULMONARY A: Acute Hypoxic Respiratory Failure - Unable to protect airway with acute encephalopathy. Positive 11L. OHS  P:   Plan for extubation today Incentive Spirometry post extubation Weaning FiO2 for sat >92%  CARDIOVASCULAR A:  Shock - Likely sepsis. Cortisol 45.1. Resolved. H/O HTN & HLD  P:  Vitals per unit protocol Continuous telemetry monitoring Diuresis with HD intermittently  RENAL A:   Acute On Chronic Renal Failure Stage IV  - worsens without HD treatments Profound Metabolic Acidosis - Resolved w/  HD. Hyperkalemia - Resolved.  Lactic Acidosis - Resolved. Likely due to hypoperfusion in setting of Metformin.   Hyperphosphatemia - Secondary to acute renal failure. Resolved. Pseudohypocalcemia - Improving.   P:   Monitoring UOP with Foley Not a good long-term candidate for HD per Nephrology Appreciate Nephrology recommendations Trending electrolytes daily Replacing electrolytes as indicated Plan for perm-a-cath placement on Monday with IR  GASTROINTESTINAL A:   Partial SBO - Resolving on XR abd 9/30. Having BMs. H/O Ventral Hernia Repair. Possible Liver Cirrhosis. RUQ U/S normal. Ammonia WNL 10/5.  Transaminitis - Likely shock liver versus congestive hepatopathy. Improving.  Diarrhea - Secondary to C diff.   P:   NPO Holding Tube Feedings Pepcid VT daily Trending LFTs Intermittently   HEMATOLOGIC A:   Anemia - Chronic. No  signs of active bleeding. S/P 1u on 10/6 & 1u on 10/11. Hgb relatively stable.. Leukocytosis - Likely sepsis. Resolving. 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 Unlikely with neg SRA. Thrombocytopenia - Resolved.  P:  Trending cell counts daily w/ CBC Transfusion goal of 7.0 SCDs Heparin [place] q8hr  INFECTIOUS A:   Severe Sepsis  C Diff Colitis - Found 10/10 & started PO Vancocin. E.coli and Klebsiella UTI - S/P 7 days Merrem. Sacral wound  P:   PO/OGT Vancomycin Day #5 Holding home Trimethoprim. Wound care consulted Plan to re-culture for fever   ENDOCRINE A:   Hypoglycemia - Resolved with Dextrose IVF. H/O DM Type 2 - A1c 5.8. H/O Hyperthyroidism - On PTU at home. TSH 0.331 & F T4 1.33 9/26. Repeat TSH 0.458 & FT4 1.78 on 10/14.  P:   SSI per Sensitive Algorithm. Holding NPH with tube feedings off Holding PTU & Metformin. Starting Methimazole '5mg'$  VT daily  NEUROLOGIC A:   Acute Encephalopathy - Question hepatic vs toxic metabolic vs hypoxia. No improvement in Lactulose Enema despite improved Ammonia.  Favor toxic metabolic as is gradually  improving with CRRT. Repeat CT head unchanged. TSH WNL. Folate WNL. H/O CVA - Minimal mobility.  P:   RASS goal: 0 to -1 Avoid sedating meds Fentanyl IV prn pain/discomfort  MUSCULOSKELETAL A: Edema Left Arm - Improved. Secondary to IV infiltration. No gas on X-ray 9/27.  P: Continue to monitor   FAMILY  - Updates:  Daughter updated at bedside 10/9 by Dr. Ashok Cordia. Indicates she wishes to continue with hemodialysis and tracheostomy if necessary. Daughter updated 10/11 by Resident MD.  - Inter-disciplinary family meet or Palliative Care meeting due by: Continued [place] discussion 10/6 with daughter [name].   TODAY'S SUMMAR:  65 y.o. female with T2DM, CKD, recurrent UTIs 2/2 incontinence, and stroke with subsequent severe debilitation (Left sided weakness; ECOG 4), who presented with AMS and found to be in shock, likely 2/2 UTI sepsis. Some improvement in level of alertness with CRRT. Completed 7-day course of meropenem 10/3. Plan for extubation today if patient able to pass a 30 minute SBT. We will be ready to re-intubate if necessary. May require NGT placement for feeding & certainly a speech evaluation if she remains extubated.   I have spent a total of 31 minutes of critical care time today caring for the patient and reviewing the patient's electronic medical record.   Sonia Baller Ashok Cordia, M.D. [facility] Pager:  [phone] After 3pm or if no response, call [phone] 07/08/2016, 12:19 PM

## 2016-07-08 NOTE — Progress Notes (Signed)
S: intubated.  Pt seen on HD AP 100 Vp 250  BFR 300 O:BP (!) 149/63 (BP Location: Right Arm)   Pulse (!) 107   Temp 97.6 F (36.4 C) (Oral)   Resp (!) 23   Ht _0  (1.676 m)   Wt (P) 107.5 kg (236 lb 15.9 oz)   SpO2 98%   BMI (P) 38.25 kg/m   Intake/Output Summary (Last 24 hours) at 07/08/16 0751 Last data filed at 07/08/16 0700  Gross per 24 hour  Intake             1878 ml  Output                0 ml  Net             1878 ml   Weight change: 3.1 kg (6 lb 13.4 oz) GXQ:JJHERDEYC  CVS:RRR Resp:clear ant Abd: +BS NTND Ext: tr edema NEURO: eyes opened, unable to move extremities, answers simple ? Rt IJ HD cath temp Lt IJ triple lumen   . aspirin  162 mg Per Tube Daily  . chlorhexidine gluconate (MEDLINE KIT)  15 mL Mouth Rinse BID  . darbepoetin (ARANESP) injection - DIALYSIS  100 mcg Intravenous Q Mon-HD  . famotidine  20 mg Per Tube Daily  . feeding supplement (VITAL HIGH PROTEIN)  1,000 mL Per Tube Q24H  . heparin subcutaneous  5,000 Units Subcutaneous Q8H  . insulin aspart  0-9 Units Subcutaneous Q4H  . mouth rinse  15 mL Mouth Rinse QID  . multivitamin  15 mL Oral Daily  . vancomycin  500 mg Oral Q6H   Dg Chest Port 1 View  Result Date: 07/07/2016 CLINICAL DATA:  Respiratory distress. EXAM: PORTABLE CHEST 1 VIEW COMPARISON:  Multiple recent chest x-rays. The most recent is 06/27/2016 FINDINGS: The endotracheal to is 23 mm above the carina. The left IJ central venous catheter tip is in the distal SVC near the cavoatrial junction. The right IJ non tunneled dialysis catheter has an unusual course. It could be an external jugular vein 10 minutes courses across the midline and is probably in the left brachiocephalic vein although it does not have the same course as the left sided catheter. Recommend correlation with venous blood return. Could not exclude an arterial location. If this is venous it is not in a good position for dialysis and should be repositioned. The NG tube  is stable. The heart is enlarged. Persistent vascular congestion and probable mild interstitial edema. No large pleural effusions. IMPRESSION: 1. Malpositioned right IJ central venous catheter. This is most likely in the left brachiocephalic vein but could not exclude arterial location. Recommend correlation with blood return or pressure monitoring. If it is venous it is in a poor location for dialysis and should probably be repositioned. 2. Stable vascular congestion and mild edema without pleural effusions or pneumothorax. These results will be called to the ordering clinician or representative by the Radiologist Assistant, and communication documented in the PACS or zVision Dashboard. Electronically Signed   By: Marijo Sanes M.D.   On: 07/07/2016 10:37   BMET    Component Value Date/Time   NA 132 (L) 07/08/2016 0458   K 3.8 07/08/2016 0458   CL 98 (L) 07/08/2016 0458   CO2 22 07/08/2016 0458   GLUCOSE 263 (H) 07/08/2016 0458   BUN 101 (H) 07/08/2016 0458   CREATININE 3.64 (H) 07/08/2016 0458   CALCIUM 8.8 (L) 07/08/2016 0458   GFRNONAA 12 (L) 07/08/2016  0458   GFRAA 14 (L) 07/08/2016 0458   CBC    Component Value Date/Time   WBC 11.3 (H) 07/08/2016 0458   RBC 2.74 (L) 07/08/2016 0458   HGB 7.9 (L) 07/08/2016 0458   HCT 23.6 (L) 07/08/2016 0458   HCT 26.8 (L) 06/27/2016 1536   PLT 323 07/08/2016 0458   MCV 86.1 07/08/2016 0458   MCH 28.8 07/08/2016 0458   MCHC 33.5 07/08/2016 0458   RDW 15.7 (H) 07/08/2016 0458   LYMPHSABS 1.0 07/08/2016 0458   MONOABS 0.9 07/08/2016 0458   EOSABS 0.1 07/08/2016 0458   BASOSABS 0.0 07/08/2016 0458     Assessment:  1. Acute on CKD 3 secondary to ATN from shock/sepsis.  Still with minimal UO.  Getting HD TTS.   2. VDRF, suspect she will need to be trached 3 Anemia on aranesp 4. ? HIT, Plt better 5. C diff colitis on oral vanco  Plan: 1. HD today.  (Dr Lorrene Reid met with daughter and plans are to cont HD).  2.  Will need perm cath placed  when CCM feels it is safe     Easton Sivertson T

## 2016-07-08 NOTE — Progress Notes (Signed)
PCCM Attending SBT Note: Hemodynamically stable with mildly increased work of breathing on PS 0/5 FiO2 0.40 for 30 minutes. Plan for extubation if cuff leak is present.   Sonia Baller Ashok Cordia, M.D. [facility] Pager:  [phone] After 3pm or if no response, call [phone] 1:17 PM 07/08/16

## 2016-07-08 NOTE — Progress Notes (Signed)
Orthopedic Tech Progress Note Patient Details:  Angelica Beck 1951/05/20 [id number]  Ortho Devices Type of Ortho Device:  (prafo boot) Ortho Device/Splint Location: bilateral Ortho Device/Splint Interventions: Application   [name] 07/08/2016, 2:16 PM

## 2016-07-08 NOTE — Progress Notes (Signed)
RT note- Patient found to have excessive secretions, moderate amount suctioned with nasal catheter. Bipap placedjper Dr. Ashok Cordia, and report given to unit therapist.

## 2016-07-08 NOTE — Procedures (Signed)
Extubation Procedure Note  Patient Details:   Name: Angelica Beck DOB: Aug 14, 1951 MRN: [id number]   Airway Documentation:     Evaluation  O2 sats: stable throughout Complications: No apparent complications Patient did tolerate procedure well. Bilateral Breath Sounds: Rhonchi   Yes  4l/min  Good cuff leak  Revonda Standard 07/08/2016, 1:26 PM

## 2016-07-09 LAB — GLUCOSE, CAPILLARY
GLUCOSE-CAPILLARY: 123 mg/dL — AB (ref 65–99)
GLUCOSE-CAPILLARY: 134 mg/dL — AB (ref 65–99)
Glucose-Capillary: 125 mg/dL — ABNORMAL HIGH (ref 65–99)
Glucose-Capillary: 133 mg/dL — ABNORMAL HIGH (ref 65–99)
Glucose-Capillary: 143 mg/dL — ABNORMAL HIGH (ref 65–99)
Glucose-Capillary: 129 mg/dL — ABNORMAL HIGH (ref 65–99)

## 2016-07-09 LAB — CBC WITH DIFFERENTIAL/PLATELET
Basophils Absolute: 0.1 10*3/uL (ref 0.0–0.1)
Basophils Relative: 1 %
EOS PCT: 1 %
Eosinophils Absolute: 0.1 10*3/uL (ref 0.0–0.7)
HEMATOCRIT: 24.4 % — AB (ref 36.0–46.0)
HEMOGLOBIN: 8 g/dL — AB (ref 12.0–15.0)
LYMPHS ABS: 1.4 10*3/uL (ref 0.7–4.0)
Lymphocytes Relative: 12 %
MCH: 28.6 pg (ref 26.0–34.0)
MCHC: 32.8 g/dL (ref 30.0–36.0)
MCV: 87.1 fL (ref 78.0–100.0)
MONOS PCT: 8 %
Monocytes Absolute: 0.9 10*3/uL (ref 0.1–1.0)
NEUTROS ABS: 9 10*3/uL — AB (ref 1.7–7.7)
Neutrophils Relative %: 78 %
Platelets: 335 10*3/uL (ref 150–400)
RBC: 2.8 MIL/uL — ABNORMAL LOW (ref 3.87–5.11)
RDW: 15.5 % (ref 11.5–15.5)
WBC: 11.5 10*3/uL — ABNORMAL HIGH (ref 4.0–10.5)

## 2016-07-09 LAB — RENAL FUNCTION PANEL
ANION GAP: 11 (ref 5–15)
Albumin: 1.4 g/dL — ABNORMAL LOW (ref 3.5–5.0)
BUN: 63 mg/dL — ABNORMAL HIGH (ref 6–20)
CALCIUM: 8.8 mg/dL — AB (ref 8.9–10.3)
CO2: 24 mmol/L (ref 22–32)
Chloride: 100 mmol/L — ABNORMAL LOW (ref 101–111)
Creatinine, Ser: 2.82 mg/dL — ABNORMAL HIGH (ref 0.44–1.00)
GFR calc non Af Amer: 17 mL/min — ABNORMAL LOW (ref >60)
GFR, EST AFRICAN AMERICAN: 19 mL/min — AB (ref >60)
Glucose, Bld: 125 mg/dL — ABNORMAL HIGH (ref 65–99)
Phosphorus: 4.3 mg/dL (ref 2.5–4.6)
Potassium: 3.9 mmol/L (ref 3.5–5.1)
SODIUM: 135 mmol/L (ref 135–145)

## 2016-07-09 LAB — MAGNESIUM: Magnesium: 1.8 mg/dL (ref 1.7–2.4)

## 2016-07-09 MED ORDER — METRONIDAZOLE IN NACL 5-0.79 MG/ML-% IV SOLN
500.0000 mg | Freq: Three times a day (TID) | INTRAVENOUS | Status: DC
  Administered 2016-07-09 – 2016-07-10 (×4): 500 mg via INTRAVENOUS
  Filled 2016-07-09 (×6): qty 100

## 2016-07-09 NOTE — Progress Notes (Signed)
Pt refusing NGT. Explained need to administer TF and medication to treat C-Diff.

## 2016-07-09 NOTE — Progress Notes (Signed)
RT note- increaed work of breathing, requiring nasal tracheal suctioning, NIV then placed.

## 2016-07-09 NOTE — Progress Notes (Signed)
S: On bipap O:BP (!) 142/67   Pulse 100   Temp 97.7 F (36.5 C) (Oral)   Resp (!) 23   Ht '5\' 6"'  (1.676 m)   Wt 105.5 kg (232 lb 9.4 oz)   SpO2 100%   BMI 37.54 kg/m   Intake/Output Summary (Last 24 hours) at 07/09/16 0734 Last data filed at 07/09/16 0700  Gross per 24 hour  Intake              570 ml  Output             3001 ml  Net            -2431 ml   Weight change: -4.9 kg (-10 lb 12.8 oz) Gen: BiPap CVS:RRR Resp:clear ant Abd: +BS NTND Ext: tr edema NEURO: eyes opened, unable to move extremities, answers simple ? Rt IJ HD cath temp Lt IJ triple lumen   . aspirin  162 mg Per Tube Daily  . chlorhexidine gluconate (MEDLINE KIT)  15 mL Mouth Rinse BID  . darbepoetin (ARANESP) injection - DIALYSIS  100 mcg Intravenous Q Mon-HD  . famotidine  20 mg Per Tube Daily  . feeding supplement (VITAL HIGH PROTEIN)  1,000 mL Per Tube Q24H  . heparin subcutaneous  5,000 Units Subcutaneous Q8H  . insulin aspart  0-9 Units Subcutaneous Q4H  . mouth rinse  15 mL Mouth Rinse QID  . methimazole  5 mg Per Tube Daily  . metronidazole  500 mg Intravenous Q8H  . multivitamin  15 mL Oral Daily  . vancomycin  500 mg Oral Q6H   Dg Chest Port 1 View  Result Date: 07/07/2016 CLINICAL DATA:  Respiratory distress. EXAM: PORTABLE CHEST 1 VIEW COMPARISON:  Multiple recent chest x-rays. The most recent is 06/27/2016 FINDINGS: The endotracheal to is 23 mm above the carina. The left IJ central venous catheter tip is in the distal SVC near the cavoatrial junction. The right IJ non tunneled dialysis catheter has an unusual course. It could be an external jugular vein 10 minutes courses across the midline and is probably in the left brachiocephalic vein although it does not have the same course as the left sided catheter. Recommend correlation with venous blood return. Could not exclude an arterial location. If this is venous it is not in a good position for dialysis and should be repositioned. The NG tube  is stable. The heart is enlarged. Persistent vascular congestion and probable mild interstitial edema. No large pleural effusions. IMPRESSION: 1. Malpositioned right IJ central venous catheter. This is most likely in the left brachiocephalic vein but could not exclude arterial location. Recommend correlation with blood return or pressure monitoring. If it is venous it is in a poor location for dialysis and should probably be repositioned. 2. Stable vascular congestion and mild edema without pleural effusions or pneumothorax. These results will be called to the ordering clinician or representative by the Radiologist Assistant, and communication documented in the PACS or zVision Dashboard. Electronically Signed   By: Marijo Sanes M.D.   On: 07/07/2016 10:37   BMET    Component Value Date/Time   NA 135 07/09/2016 0500   K 3.9 07/09/2016 0500   CL 100 (L) 07/09/2016 0500   CO2 24 07/09/2016 0500   GLUCOSE 125 (H) 07/09/2016 0500   BUN 63 (H) 07/09/2016 0500   CREATININE 2.82 (H) 07/09/2016 0500   CALCIUM 8.8 (L) 07/09/2016 0500   GFRNONAA 17 (L) 07/09/2016 0500   GFRAA 19 (  L) 07/09/2016 0500   CBC    Component Value Date/Time   WBC 11.5 (H) 07/09/2016 0500   RBC 2.80 (L) 07/09/2016 0500   HGB 8.0 (L) 07/09/2016 0500   HCT 24.4 (L) 07/09/2016 0500   HCT 26.8 (L) 06/27/2016 1536   PLT 335 07/09/2016 0500   MCV 87.1 07/09/2016 0500   MCH 28.6 07/09/2016 0500   MCHC 32.8 07/09/2016 0500   RDW 15.5 07/09/2016 0500   LYMPHSABS 1.4 07/09/2016 0500   MONOABS 0.9 07/09/2016 0500   EOSABS 0.1 07/09/2016 0500   BASOSABS 0.1 07/09/2016 0500     Assessment:  1. Acute on CKD 3 secondary to ATN from shock/sepsis.  Still with minimal UO.  Getting HD TTS.   2. VDRF, suspect she will need to be trached 3 Anemia on aranesp 4. ? HIT, Plt better 5. C diff colitis on oral vanco  Plan: 1. HD tuesday (Dr Lorrene Reid met with daughter and plans are to cont HD).  2.  Will need perm cath placed when CCM  feels it is safe     Lanorris Kalisz T

## 2016-07-09 NOTE — Progress Notes (Addendum)
  Request received to place tunneled hemodialysis catheter.  Chart reviewed  Patient on BiPAP  Notes recommend tracheostomy placement and also recommend proceeding with HD catheter when medically stable per CCM  Patient has temporary trialysis catheter in place currently.  We would like to hold off on procedure until tracheostomy in place. This would minimize risks during procedure which include moderate sedation by protecting the airway.  Will follow chart.   Stefan Markarian S Louden Houseworth PA-C 07/09/2016 10:46 AM

## 2016-07-09 NOTE — Evaluation (Signed)
SLP Cancellation Note  Patient Details Name: Angelica Beck MRN: [id number] DOB: 12/31/1950   Cancelled treatment:       Reason Eval/Treat Not Completed: Other (comment);Medical issues which prohibited therapy (pt on bipap for respiratory support, will follow up next date for po readiness)  Note pt required NT suctioning for secretion clearance.    Luanna Salk, [facility] SLP [phone]

## 2016-07-09 NOTE — Progress Notes (Signed)
PULMONARY / CRITICAL CARE MEDICINE   Name: Angelica Beck MRN: [id number] DOB: August 08, 1951    ADMISSION DATE:  06/20/2016 CONSULTATION DATE:  06/20/2016  REFERRING MD:  EDP Dr Cathleen Fears   CHIEF COMPLAINT:  AMS  HISTORY OF PRESENT ILLNESS:   65 year old female with past medical history as below, which is significant for stroke with subsequent debilitation, diabetes, chronic kidney disease, chronic urinary tract infections due to incontinence, for which she takes chronic antibiotics. She lives at home with her daughter where she requires full assistance she is unable to truly ambulate and spends most of her time in a hospital bed/recliner. She relies on family members almost entirely for activities of daily living. She was in her usual state of health until about 9/23 when she developed nausea and vomiting. This persisted for 2 days until symptoms subsided, however, she continued to complain of abdominal pain. 9/26 when the patient's daughter got home from work the patient was found to be unresponsive. EMS was called and she was found hypoglycemic which was corrected, however, altered mental status did not improve. Upon arrival to the emergency department she continued to be lethargic and was found to be hypotensive with hypoxemia. Laboratory evaluation significant for potassium 7.4, bicarbonate less than 7, creatinine 6.51, BUN 95, WBC 12.3, lactic acid 13.88, pH 7.07.  SUBJECTIVE:  Patient extubated yesterday. Had some difficulty clearing secretions and required nasotracheal suctioning. Also placed on BiPAP for slightly increased work of breathing. Patient nods no to any chest pain or difficulty breathing at this time. Nods no to any nausea or belly pain.   REVIEW OF SYSTEMS:  Unable to obtain with BiPAP mask in place.  VITAL SIGNS: BP 139/75   Pulse 99   Temp 97.4 F (36.3 C) (Axillary)   Resp 20   Ht '5\' 6"'$  (1.676 m)   Wt 232 lb 9.4 oz (105.5 kg)   SpO2 100%   BMI 37.54 kg/m    HEMODYNAMICS:    VENTILATOR SETTINGS: Vent Mode: BIPAP;PCV FiO2 (%):  [40 %] 40 % Set Rate:  [10 bmp-20 bmp] 15 bmp Vt Set:  [470 mL] 470 mL PEEP:  [5 cmH20-6 cmH20] 5 cmH20 Pressure Support:  [0 cmH20] 0 cmH20  INTAKE / OUTPUT: I/O last 3 completed shifts: In: 1400 [I.V.:350; NG/GT:1050] Out: 3001 [Other:3000; Stool:1]  PHYSICAL EXAMINATION: General:  Obese female. Currently on BiPAP. No family at bedside. Neuro: Nods appropriately. Tracks to voice. Following commands. HEENT:  No scleral icterus. ETT in place. Goiter noted. Cardiovascular:  Regular rhythm. Ansarca persists but is improving. Pulmonary: Normal work of breathing on BiPAP. Good aeration bilaterally. Clear on auscultation anteriorly. Abdomen:  Soft. Protuberant. Normoactive bowel sounds. Musculoskeletal:  No joint deformity or effusion. Lifting head off of bed. Skin:  Warm & dry. No rash on exposed skin.  LABS:  BMET  Recent Labs Lab 07/07/16 0436 07/08/16 0458 07/09/16 0500  NA 135 132* 135  K 3.7 3.8 3.9  CL 100* 98* 100*  CO2 '24 22 24  '$ BUN 71* 101* 63*  CREATININE 2.94* 3.64* 2.82*  GLUCOSE 135* 263* 125*    Electrolytes  Recent Labs Lab 07/07/16 0436 07/08/16 0458 07/09/16 0500  CALCIUM 8.7* 8.8* 8.8*  MG 1.8 2.0 1.8  PHOS 3.0 4.6 4.3    CBC  Recent Labs Lab 07/07/16 0436 07/08/16 0458 07/09/16 0500  WBC 16.2* 11.3* 11.5*  HGB 7.7* 7.9* 8.0*  HCT 23.3* 23.6* 24.4*  PLT 274 323 335    Coag's No results for input(s):  APTT, INR in the last 168 hours.  Sepsis Markers No results for input(s): LATICACIDVEN, PROCALCITON, O2SATVEN in the last 168 hours.  ABG  Recent Labs Lab 07/08/16 2008  PHART 7.445  PCO2ART 37.9  PO2ART 104.0    Liver Enzymes  Recent Labs Lab 07/03/16 0430  07/05/16 0345  07/07/16 0436 07/08/16 0458 07/09/16 0500  AST 450*  --  145*  --  93*  --   --   ALT 208*  --  99*  --  71*  --   --   ALKPHOS 480*  --  360*  --  304*  --   --    BILITOT 0.5  --  0.7  --  0.5  --   --   ALBUMIN 1.6*  < > 1.5*  < > 1.4* 1.3* 1.4*  < > = values in this interval not displayed.  Cardiac Enzymes No results for input(s): TROPONINI, PROBNP in the last 168 hours.  Glucose  Recent Labs Lab 07/08/16 0800 07/08/16 1200 07/08/16 1617 07/08/16 2003 07/09/16 0017 07/09/16 0802  GLUCAP 261* 200* 176* 175* 134* 125*    Imaging No results found.  STUDIES:  CT Head 9/27:  No acute abnormality. Chronic small vessel ischemic changes. Chronic lacunar infarct left basal ganglia & chronic ischemia right side of pons.  CT Abd/Pelvis w/o 9/27:  Early partial SBO w/ transition point LLQ with adhesions. Left humerus soft tissue swelling & intramuscular gas. Punctate hepatic granulomas noted.  Left Arm X-ray 9/27:  Diffuse soft tissue edema.  TTE 9/27:  LV w/ EF 60-65%. Grade 1 Diastolic dysfunction. No regional wall motion abnormalities. LA & RA normal in size. RV normal in size & function. Mild AS w/o AR. Mild MR w/o MS.  ABIs 10/3: R normal. L posterior artery noncompressible; suspect calcification.  CT head 10/3: No acute infarct, no hemorrhage or mass. Atrophy evident. Similar to prior study.   MICROBIOLOGY: MRSA PCR 9/27:  Negative Blood Ctx x 2 9/26: Coag Neg Staph 1/2 Urine Ctx 9/26:  5000 CFU E coli & 1000 CFU Klebsiella  C diff PCR 10/10:  Toxin & Antigen Positive  ANTIBIOTICS: Levaquin 9/26 - 9/28 Aztreonam 9/26 - 9/27 Vancomycin 9/26 - off Merrem 9/27 - 10/3 PO Vancocin 10/10 >>\ Flagyl IV 10/15 (while NGT is out) >>  SIGNIFICANT EVENTS: 9/26 - admit for presumed septic shock  LINES/TUBES: OETT 7.5 9/26 - 10/14 OGT 9/26 - 10/14 L IJ CVL 9/26 >> Foley 9/26 >> RIJ HD 9/30 >>  ASSESSMENT / PLAN:  PULMONARY A: Acute Hypoxic Respiratory Failure - Unable to protect airway with acute encephalopathy. Positive 11L. OHS  P:   BiPAP prn work of breathing Attempt to wean off of BiPAP intermittently today Nastotracheal  suctioning Incentive Spirometry  Weaning FiO2 for sat >92%  CARDIOVASCULAR A:  Shock - Likely sepsis. Cortisol 45.1. Resolved. H/O HTN & HLD  P:  Vitals per unit protocol Continuous telemetry monitoring Diuresis with HD intermittently  RENAL A:   Acute On Chronic Renal Failure Stage IV  - worsens without HD treatments Profound Metabolic Acidosis - Resolved w/ HD. Hyperkalemia - Resolved.  Lactic Acidosis - Resolved. Likely due to hypoperfusion in setting of Metformin.   Hyperphosphatemia - Secondary to acute renal failure. Resolved. Pseudohypocalcemia - Improving.   P:   Appreciate Nephrology recommendations Trending electrolytes daily Replacing electrolytes as indicated Plan for perm-a-cath placement on Monday with IR  GASTROINTESTINAL A:   Partial SBO - Resolving on XR abd 9/30.  Having BMs. H/O Ventral Hernia Repair. Possible Liver Cirrhosis. RUQ U/S normal. Ammonia WNL 10/5.  Transaminitis - Likely shock liver versus congestive hepatopathy. Improving.  Diarrhea - Secondary to C diff.   P:   NPO pending speech evaluation Plan for NGT placement Pepcid VT daily Trending LFTs Intermittently   HEMATOLOGIC A:   Anemia - Chronic. No signs of active bleeding. S/P 1u on 10/6 & 1u on 10/11. Hgb relatively stable.. Leukocytosis - Likely sepsis. Resolving. 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 Unlikely with neg SRA. Thrombocytopenia - Resolved.  P:  Trending cell counts daily w/ CBC Transfusion goal of 7.0 SCDs Heparin [place] q8hr  INFECTIOUS A:   Severe Sepsis  C Diff Colitis - Found 10/10 & started PO Vancocin. E.coli and Klebsiella UTI - S/P 7 days Merrem. Sacral wound  P:   PO/OGT Vancomycin Day #6 & Flagyl IV day #1 Holding home Trimethoprim. Wound care consulted Plan to re-culture for fever   ENDOCRINE A:   Hypoglycemia - Resolved with Dextrose IVF. H/O DM Type 2 - A1c 5.8. H/O Hyperthyroidism - On PTU at home. TSH 0.331 & F T4 1.33 9/26. Repeat TSH 0.458 & FT4  1.78 on 10/14.  P:   SSI per Sensitive Algorithm. Holding NPH with tube feedings off Holding PTU & Metformin. Methimazole '5mg'$  VT daily  NEUROLOGIC A:   Acute Encephalopathy - Question hepatic vs toxic metabolic vs hypoxia. No improvement in Lactulose Enema despite improved Ammonia.  Favor toxic metabolic as is gradually improving with CRRT. Repeat CT head unchanged. TSH WNL. Folate WNL. H/O CVA - Minimal mobility.  P:   Avoid sedating meds Discontinue Fentanyl  MUSCULOSKELETAL A: Edema Left Arm - Improved. Secondary to IV infiltration. No gas on X-ray 9/27.  P: Continue to monitor   FAMILY  - Updates: No family at bedside 10/15.  - Inter-disciplinary family meet or Palliative Care meeting due by: Continued [place] discussion 10/6 with daughter [name].   TODAY'S SUMMARY:  65 y.o. female with T2DM, CKD, recurrent UTIs 2/2 incontinence, and stroke with subsequent severe debilitation (Left sided weakness; ECOG 4), who presented with AMS and found to be in shock, likely 2/2 UTI sepsis. Some improvement in level of alertness with CRRT. Completed 7-day course of meropenem 10/3. Successfully extubated yesterday but respiratory status remains tenuous with high risk of reintubation given intermittent noninvasive positive pressure ventilation requirement. We will attempt to wean patient off noninvasive positive pressure ventilation today while monitoring neurological status closely. Switching to Flagyl in [place] of oral vancomycin without enteric tube access.  I have spent a total of 32 minutes of critical care time today caring for the patient and reviewing the patient's electronic medical record.   Sonia Baller Ashok Cordia, M.D. [facility] Pulmonary & Critical Care Pager:  [phone] After 3pm or if no response, call [phone] 07/09/2016, 10:43 AM

## 2016-07-10 ENCOUNTER — Inpatient Hospital Stay (HOSPITAL_COMMUNITY): Payer: Medicare Other

## 2016-07-10 DIAGNOSIS — J9600 Acute respiratory failure, unspecified whether with hypoxia or hypercapnia: Secondary | ICD-10-CM

## 2016-07-10 LAB — POCT I-STAT 3, ART BLOOD GAS (G3+)
Acid-base deficit: 4 mmol/L — ABNORMAL HIGH (ref 0.0–2.0)
Bicarbonate: 20.5 mmol/L (ref 20.0–28.0)
O2 Saturation: 100 %
PCO2 ART: 33.4 mmHg (ref 32.0–48.0)
PO2 ART: 240 mmHg — AB (ref 83.0–108.0)
Patient temperature: 97.2
TCO2: 22 mmol/L (ref 0–100)
pH, Arterial: 7.391 (ref 7.350–7.450)

## 2016-07-10 LAB — RENAL FUNCTION PANEL
ANION GAP: 15 (ref 5–15)
Albumin: 1.5 g/dL — ABNORMAL LOW (ref 3.5–5.0)
BUN: 82 mg/dL — ABNORMAL HIGH (ref 6–20)
CO2: 21 mmol/L — AB (ref 22–32)
Calcium: 9.1 mg/dL (ref 8.9–10.3)
Chloride: 99 mmol/L — ABNORMAL LOW (ref 101–111)
Creatinine, Ser: 3.72 mg/dL — ABNORMAL HIGH (ref 0.44–1.00)
GFR calc Af Amer: 14 mL/min — ABNORMAL LOW (ref >60)
GFR calc non Af Amer: 12 mL/min — ABNORMAL LOW (ref >60)
GLUCOSE: 138 mg/dL — AB (ref 65–99)
POTASSIUM: 4.1 mmol/L (ref 3.5–5.1)
Phosphorus: 6.8 mg/dL — ABNORMAL HIGH (ref 2.5–4.6)
SODIUM: 135 mmol/L (ref 135–145)

## 2016-07-10 LAB — BLOOD GAS, ARTERIAL
ACID-BASE DEFICIT: 5.5 mmol/L — AB (ref 0.0–2.0)
BICARBONATE: 19.5 mmol/L — AB (ref 20.0–28.0)
DRAWN BY: 270221
Delivery systems: POSITIVE
FIO2: 0.4
INSPIRATORY PAP: 7
Mode: POSITIVE
O2 SAT: 98 %
PEEP/CPAP: 5 cmH2O
PH ART: 7.324 — AB (ref 7.350–7.450)
PO2 ART: 121 mmHg — AB (ref 83.0–108.0)
Patient temperature: 98.6
RATE: 15 resp/min
pCO2 arterial: 38.6 mmHg (ref 32.0–48.0)

## 2016-07-10 LAB — CBC WITH DIFFERENTIAL/PLATELET
BASOS ABS: 0.1 10*3/uL (ref 0.0–0.1)
Basophils Relative: 1 %
Eosinophils Absolute: 0.1 10*3/uL (ref 0.0–0.7)
Eosinophils Relative: 1 %
HEMATOCRIT: 25.5 % — AB (ref 36.0–46.0)
Hemoglobin: 8.3 g/dL — ABNORMAL LOW (ref 12.0–15.0)
LYMPHS ABS: 1.5 10*3/uL (ref 0.7–4.0)
Lymphocytes Relative: 11 %
MCH: 28.3 pg (ref 26.0–34.0)
MCHC: 32.5 g/dL (ref 30.0–36.0)
MCV: 87 fL (ref 78.0–100.0)
MONOS PCT: 6 %
Monocytes Absolute: 0.8 10*3/uL (ref 0.1–1.0)
NEUTROS ABS: 11.2 10*3/uL — AB (ref 1.7–7.7)
Neutrophils Relative %: 81 %
Platelets: 366 10*3/uL (ref 150–400)
RBC: 2.93 MIL/uL — ABNORMAL LOW (ref 3.87–5.11)
RDW: 15.7 % — AB (ref 11.5–15.5)
WBC: 13.7 10*3/uL — ABNORMAL HIGH (ref 4.0–10.5)

## 2016-07-10 LAB — PROTIME-INR
INR: 1.16
PROTHROMBIN TIME: 14.9 s (ref 11.4–15.2)

## 2016-07-10 LAB — GLUCOSE, CAPILLARY
GLUCOSE-CAPILLARY: 133 mg/dL — AB (ref 65–99)
GLUCOSE-CAPILLARY: 171 mg/dL — AB (ref 65–99)
Glucose-Capillary: 117 mg/dL — ABNORMAL HIGH (ref 65–99)
Glucose-Capillary: 119 mg/dL — ABNORMAL HIGH (ref 65–99)
Glucose-Capillary: 130 mg/dL — ABNORMAL HIGH (ref 65–99)
Glucose-Capillary: 219 mg/dL — ABNORMAL HIGH (ref 65–99)

## 2016-07-10 LAB — MAGNESIUM: Magnesium: 1.9 mg/dL (ref 1.7–2.4)

## 2016-07-10 MED ORDER — VECURONIUM BROMIDE 10 MG IV SOLR
10.0000 mg | Freq: Once | INTRAVENOUS | Status: DC
  Filled 2016-07-10: qty 10

## 2016-07-10 MED ORDER — NEPRO/CARBSTEADY PO LIQD
1000.0000 mL | ORAL | Status: DC
  Administered 2016-07-10 – 2016-07-12 (×2): 1000 mL via ORAL
  Filled 2016-07-10 (×6): qty 1000

## 2016-07-10 MED ORDER — MIDAZOLAM HCL 2 MG/2ML IJ SOLN
2.0000 mg | Freq: Once | INTRAMUSCULAR | Status: AC
  Administered 2016-07-10: 2 mg via INTRAVENOUS

## 2016-07-10 MED ORDER — CHLORHEXIDINE GLUCONATE 0.12% ORAL RINSE (MEDLINE KIT)
15.0000 mL | Freq: Two times a day (BID) | OROMUCOSAL | Status: DC
  Administered 2016-07-10 – 2016-07-18 (×16): 15 mL via OROMUCOSAL

## 2016-07-10 MED ORDER — MIDAZOLAM HCL 2 MG/2ML IJ SOLN: 4.0000 mg | Freq: Once | INTRAMUSCULAR | Status: DC

## 2016-07-10 MED ORDER — PROPOFOL 500 MG/50ML IV EMUL
5.0000 ug/kg/min | Freq: Once | INTRAVENOUS | Status: DC
  Filled 2016-07-10: qty 50

## 2016-07-10 MED ORDER — ETOMIDATE 2 MG/ML IV SOLN
20.0000 mg | Freq: Once | INTRAVENOUS | Status: AC
Start: 2016-07-10 — End: 2016-07-10
  Administered 2016-07-10: 20 mg via INTRAVENOUS

## 2016-07-10 MED ORDER — FENTANYL CITRATE (PF) 100 MCG/2ML IJ SOLN
100.0000 ug | INTRAMUSCULAR | Status: DC | PRN
  Administered 2016-07-10 (×2): 100 ug via INTRAVENOUS
  Filled 2016-07-10: qty 2

## 2016-07-10 MED ORDER — FENTANYL CITRATE (PF) 100 MCG/2ML IJ SOLN
100.0000 ug | INTRAMUSCULAR | Status: DC | PRN
  Administered 2016-07-11: 100 ug via INTRAVENOUS
  Filled 2016-07-10 (×2): qty 2

## 2016-07-10 MED ORDER — HEPARIN SODIUM (PORCINE) 5000 UNIT/ML IJ SOLN
5000.0000 [IU] | Freq: Three times a day (TID) | INTRAMUSCULAR | Status: DC
  Administered 2016-07-11: 5000 [IU] via SUBCUTANEOUS
  Filled 2016-07-10 (×2): qty 1

## 2016-07-10 MED ORDER — FENTANYL CITRATE (PF) 100 MCG/2ML IJ SOLN
50.0000 ug | Freq: Once | INTRAMUSCULAR | Status: AC
  Administered 2016-07-10: 50 ug via INTRAVENOUS

## 2016-07-10 MED ORDER — MIDAZOLAM HCL 2 MG/2ML IJ SOLN
INTRAMUSCULAR | Status: AC
  Filled 2016-07-10: qty 4

## 2016-07-10 MED ORDER — ORAL CARE MOUTH RINSE: 15.0000 mL | Freq: Four times a day (QID) | OROMUCOSAL | Status: DC

## 2016-07-10 MED ORDER — ETOMIDATE 2 MG/ML IV SOLN
40.0000 mg | Freq: Once | INTRAVENOUS | Status: DC
  Filled 2016-07-10: qty 20

## 2016-07-10 MED ORDER — PRO-STAT SUGAR FREE PO LIQD
60.0000 mL | Freq: Three times a day (TID) | ORAL | Status: AC
  Administered 2016-07-10 (×2): 60 mL
  Filled 2016-07-10 (×2): qty 60

## 2016-07-10 MED ORDER — DARBEPOETIN ALFA 100 MCG/0.5ML IJ SOSY
100.0000 ug | PREFILLED_SYRINGE | INTRAMUSCULAR | Status: DC
  Administered 2016-07-11: 100 ug via INTRAVENOUS
  Filled 2016-07-10 (×2): qty 0.5

## 2016-07-10 MED ORDER — FENTANYL CITRATE (PF) 100 MCG/2ML IJ SOLN: 200.0000 ug | Freq: Once | INTRAMUSCULAR | Status: DC

## 2016-07-10 MED ORDER — FENTANYL CITRATE (PF) 100 MCG/2ML IJ SOLN
INTRAMUSCULAR | Status: AC
  Filled 2016-07-10: qty 4

## 2016-07-10 NOTE — Evaluation (Signed)
Clinical/Bedside Swallow Evaluation Patient Details  Name: Angelica Beck MRN: [id number] Date of Birth: 08-19-1951  Today's Date: 07/10/2016 Time: SLP Start Time (ACUTE ONLY): 1022 SLP Stop Time (ACUTE ONLY): 2836 SLP Time Calculation (min) (ACUTE ONLY): 11 min  Past Medical History:  Past Medical History:  Diagnosis Date  . Arthritis   . Diabetes mellitus, type 2 ([place])   . Hyperlipidemia   . Hypertension   . Hyperthyroidism   . Obesity hypoventilation syndrome ([place]) 06/20/2013  . Stroke [facility])    Past Surgical History:  Past Surgical History:  Procedure Laterality Date  . VENTRAL HERNIA REPAIR     HPI:  65 year old female with past medical history as below, which is significant for stroke with subsequent debilitation, diabetes, chronic kidney disease, chronic urinary tract infections due to incontinence, for which she takes chronic antibiotics. Upon arrival to the emergency department she continued to be lethargic and was found to be hypotensive with hypoxemia. Pt intubated on 9/26 and extubated on 10/14.   Assessment / Plan / Recommendation Clinical Impression  Pt seen significantly leaning to the right side upon SLP arrival. Pt needed max cueing and SLP physical support for holding her head upright given physical weakness. She presented with aphonic vocal quality and a weak, nonproductive coughing that was unable to clear audible wetness. Given pt's presenting symptoms, SLP did not begin PO trials. RN reports frequent NTS due to pt's poor management of secretions. Recommend pt remain NPO. Will f/u with PO trials given improved phonation and ability to manage secretions.    Aspiration Risk  Severe aspiration risk    Diet Recommendation NPO   Medication Administration: Via alternative means    Other  Recommendations Oral Care Recommendations: Oral care QID Other Recommendations: Have oral suction available   Follow up Recommendations 24 hour supervision/assistance       Frequency and Duration min 2x/week  2 weeks       Prognosis Prognosis for Safe Diet Advancement: Good Barriers to Reach Goals: Cognitive deficits;Severity of deficits      Swallow Study   General HPI: 65 year old female with past medical history as below, which is significant for stroke with subsequent debilitation, diabetes, chronic kidney disease, chronic urinary tract infections due to incontinence, for which she takes chronic antibiotics. Upon arrival to the emergency department she continued to be lethargic and was found to be hypotensive with hypoxemia. Pt intubated on 9/26 and extubated on 10/14. Type of Study: Bedside Swallow Evaluation Previous Swallow Assessment: none in chart Diet Prior to this Study: NPO Temperature Spikes Noted: No Respiratory Status: Nasal cannula History of Recent Intubation: Yes Length of Intubations (days): 18 days Date extubated: 07/08/16 Behavior/Cognition: Cooperative;Lethargic/Drowsy;Requires cueing Oral Cavity Assessment: Dried secretions Oral Care Completed by SLP: No Patient Positioning: Upright in bed Baseline Vocal Quality: Aphonic Volitional Cough: Weak Volitional Swallow: Able to elicit    Oral/Motor/Sensory Function Overall Oral Motor/Sensory Function: Other (comment) (not completed this date)   [employer] chips: Not tested   Thin Liquid Thin Liquid: Not tested    Nectar Thick Nectar Thick Liquid: Not tested   Honey Thick Honey Thick Liquid: Not tested   Puree Puree: Not tested   Solid   GO   Solid: Not tested       Angelica Beck, Student SLP  Shela Leff 07/10/2016,11:17 AM

## 2016-07-10 NOTE — Progress Notes (Signed)
PULMONARY / CRITICAL CARE MEDICINE   Name: Angelica Beck MRN: [id number] DOB: 10/06/1950    ADMISSION DATE:  06/20/2016 CONSULTATION DATE:  06/20/2016  REFERRING MD:  EDP Dr Cathleen Fears   CHIEF COMPLAINT:  AMS  HISTORY OF PRESENT ILLNESS:   64 year old female with past medical history as below, which is significant for stroke with subsequent debilitation, diabetes, chronic kidney disease, chronic urinary tract infections due to incontinence, for which she takes chronic antibiotics. She lives at home with her daughter where she requires full assistance she is unable to truly ambulate and spends most of her time in a hospital bed/recliner. She relies on family members almost entirely for activities of daily living. She was in her usual state of health until about 9/23 when she developed nausea and vomiting. This persisted for 2 days until symptoms subsided, however, she continued to complain of abdominal pain. 9/26 when the patient's daughter got home from work the patient was found to be unresponsive. EMS was called and she was found hypoglycemic which was corrected, however, altered mental status did not improve. Upon arrival to the emergency department she continued to be lethargic and was found to be hypotensive with hypoxemia. Laboratory evaluation significant for potassium 7.4, bicarbonate less than 7, creatinine 6.51, BUN 95, WBC 12.3, lactic acid 13.88, pH 7.07.  SUBJECTIVE:  Required bipap most of last night. Has been off for a couple of hours this morning. 4L O2 by [place] in place. Tube feeds held because no NG tube (refused). Patient nods no to any chest pain or difficulty breathing at this time. Nods no to any nausea or belly pain.   REVIEW OF SYSTEMS:  Denies pain. Denies trouble breathing.   VITAL SIGNS: BP (!) 158/90   Pulse 96   Temp 97.4 F (36.3 C) (Oral)   Resp 20   Ht '5\' 6"'$  (1.676 m)   Wt 233 lb 4 oz (105.8 kg)   SpO2 100%   BMI 37.65 kg/m   HEMODYNAMICS:    VENTILATOR  SETTINGS: Vent Mode: BIPAP;PCV FiO2 (%):  [40 %] 40 % Set Rate:  [15 bmp] 15 bmp PEEP:  [5 cmH20] 5 cmH20  INTAKE / OUTPUT: I/O last 3 completed shifts: In: 750 [I.V.:350; IV Piggyback:400] Out: 1 [Stool:1]  PHYSICAL EXAMINATION: General:  Obese female. [place] in place. No family at bedside. Neuro: Nods appropriately. Tracks to voice. Following commands. HEENT:  No scleral icterus. Goiter noted. Cardiovascular:  Regular rhythm. [place] persists but is improving. Pulmonary: Normal work of breathing. Good aeration bilaterally. Coarse upper airway sounds. Abdomen:  Soft. Protuberant. Normoactive bowel sounds. Musculoskeletal:  No joint deformity or effusion. Lifting head off of bed. Skin:  Warm & dry. No rash on exposed skin.  LABS:  BMET  Recent Labs Lab 07/08/16 0458 07/09/16 0500 07/10/16 0540  NA 132* 135 135  K 3.8 3.9 4.1  CL 98* 100* 99*  CO2 22 24 21*  BUN 101* 63* 82*  CREATININE 3.64* 2.82* 3.72*  GLUCOSE 263* 125* 138*    Electrolytes  Recent Labs Lab 07/08/16 0458 07/09/16 0500 07/10/16 0540  CALCIUM 8.8* 8.8* 9.1  MG 2.0 1.8 1.9  PHOS 4.6 4.3 6.8*    CBC  Recent Labs Lab 07/08/16 0458 07/09/16 0500 07/10/16 0540  WBC 11.3* 11.5* 13.7*  HGB 7.9* 8.0* 8.3*  HCT 23.6* 24.4* 25.5*  PLT 323 335 366    Coag's No results for input(s): APTT, INR in the last 168 hours.  Sepsis Markers No results for input(s):  LATICACIDVEN, PROCALCITON, O2SATVEN in the last 168 hours.  ABG  Recent Labs Lab 07/08/16 2008  PHART 7.445  PCO2ART 37.9  PO2ART 104.0    Liver Enzymes  Recent Labs Lab 07/05/16 0345  07/07/16 0436 07/08/16 0458 07/09/16 0500 07/10/16 0540  AST 145*  --  93*  --   --   --   ALT 99*  --  71*  --   --   --   ALKPHOS 360*  --  304*  --   --   --   BILITOT 0.7  --  0.5  --   --   --   ALBUMIN 1.5*  < > 1.4* 1.3* 1.4* 1.5*  < > = values in this interval not displayed.  Cardiac Enzymes No results for input(s): TROPONINI,  PROBNP in the last 168 hours.  Glucose  Recent Labs Lab 07/09/16 1214 07/09/16 1602 07/09/16 1955 07/09/16 2350 07/10/16 0359 07/10/16 0743  GLUCAP 133* 143* 129* 123* 119* 133*    Imaging No results found.  STUDIES:  CT Head 9/27:  No acute abnormality. Chronic small vessel ischemic changes. Chronic lacunar infarct left basal ganglia & chronic ischemia right side of pons.  CT Abd/Pelvis w/o 9/27:  Early partial SBO w/ transition point LLQ with adhesions. Left humerus soft tissue swelling & intramuscular gas. Punctate hepatic granulomas noted.  Left Arm X-ray 9/27:  Diffuse soft tissue edema.  TTE 9/27:  LV w/ EF 60-65%. Grade 1 Diastolic dysfunction. No regional wall motion abnormalities. LA & RA normal in size. RV normal in size & function. Mild AS w/o AR. Mild MR w/o MS.  ABIs 10/3: R normal. L posterior artery noncompressible; suspect calcification.  CT head 10/3: No acute infarct, no hemorrhage or mass. Atrophy evident. Similar to prior study.   MICROBIOLOGY: MRSA PCR 9/27:  Negative Blood Ctx x 2 9/26: Coag Neg Staph 1/2 Urine Ctx 9/26:  5000 CFU E coli & 1000 CFU Klebsiella  C diff PCR 10/10:  Toxin & Antigen Positive  ANTIBIOTICS: Levaquin 9/26 - 9/28 Aztreonam 9/26 - 9/27 Vancomycin 9/26 - off Merrem 9/27 - 10/3 PO Vancocin 10/10 >>\ Flagyl IV 10/15 (while NGT is out) >>  SIGNIFICANT EVENTS: 9/26 - admit for presumed septic shock  LINES/TUBES: OETT 7.5 9/26 - 10/14 OGT 9/26 - 10/14 L IJ CVL 9/26 >> Foley 9/26 >> RIJ HD 9/30 >>  DISCUSSION: 65 y.o. female with T2DM, CKD, recurrent UTIs 2/2 incontinence, and stroke with subsequent severe debilitation (Left sided weakness; ECOG 4), who presented with AMS and found to be in shock, likely 2/2 UTI sepsis. Some improvement in level of alertness with CRRT. Completed 7-day course of meropenem 10/3. Extubated 10/14 but requiring intermittent noninvasive positive pressure ventilation. Also being treated for C  diff.  ASSESSMENT / PLAN:  PULMONARY A: Acute Hypoxic Respiratory Failure - Now extubated. Initially unable to protect airway with acute encephalopathy. Positive 5L. OHS  P:   BiPAP prn work of breathing; may require tracheostomy if cannot be weaned Attempt to wean off of BiPAP intermittently  Nastotracheal suctioning Incentive Spirometry  Weaning FiO2 for sat >92%  CARDIOVASCULAR A:  Shock - Likely sepsis. Cortisol 45.1. Resolved. H/O HTN & HLD  P:  Vitals per unit protocol Continuous telemetry monitoring Diuresis with HD intermittently  RENAL A:   Acute On Chronic Renal Failure Stage IV  - worsens without HD treatments Profound Metabolic Acidosis - Resolved w/ HD. Hyperkalemia - Resolved.  Lactic Acidosis - Resolved. Likely due to  hypoperfusion in setting of Metformin.   Hyperphosphatemia - Secondary to acute renal failure.  Pseudohypocalcemia - Resolved.   P:   Appreciate Nephrology recommendations Next dialysis session planned for 10/17 Trending electrolytes daily Replacing electrolytes as indicated Plan for perm-a-cath placement after breathing status improves/tracheostomy  GASTROINTESTINAL A:   Partial SBO - Resolving on XR abd 9/30. Having BMs. H/O Ventral Hernia Repair. Possible Liver Cirrhosis. RUQ U/S normal. Ammonia WNL 10/5.  Transaminitis - Likely shock liver versus congestive hepatopathy. Improving.  Diarrhea - Secondary to C diff.   P:   NPO pending speech evaluation Plan for NGT placement Pepcid VT daily once replaced Trending LFTs Intermittently   HEMATOLOGIC A:   Anemia - Chronic. No signs of active bleeding. S/P 1u on 10/6 & 1u on 10/11. Hgb relatively stable. Leukocytosis - Likely sepsis. Resolving. 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 Unlikely with neg SRA. Thrombocytopenia - Resolved.  P:  Trending cell counts daily w/ CBC Transfusion goal of 7.0 SCDs Heparin  q8hr  INFECTIOUS A:   Severe Sepsis  C Diff Colitis - Found 10/10 &  started PO Vancocin. E.coli and Klebsiella UTI - S/P 7 days Merrem. Sacral wound  P:   PO/OGT Flagyl IV day #2; s/p 6 days Vancomycin but lost NG access Holding home Trimethoprim. Wound care consulted Plan to re-culture for fever   ENDOCRINE A:   Hypoglycemia - Resolved with Dextrose IVF. H/O DM Type 2 - A1c 5.8. H/O Hyperthyroidism - On PTU at home. TSH 0.331 & F T4 1.33 9/26. Repeat TSH 0.458 & FT4 1.78 on 10/14.  P:   SSI per Sensitive Algorithm. Holding NPH with tube feedings off Holding PTU & Metformin. Methimazole '5mg'$  VT daily  NEUROLOGIC A:   Acute Encephalopathy - Question hepatic vs toxic metabolic vs hypoxia. No improvement in Lactulose Enema despite improved Ammonia.  Favor toxic metabolic as is gradually improving with CRRT. Repeat CT head unchanged. TSH WNL. Folate WNL. H/O CVA - Minimal mobility.  P:   Avoid sedating meds Discontinue Fentanyl  MUSCULOSKELETAL A: Edema Left Arm - Improved. Secondary to IV infiltration. No gas on X-ray 9/27.  P: Continue to monitor   FAMILY  - Updates: No family at bedside 10/16.  - Inter-disciplinary family meet or Palliative Care meeting due by: Continued [place] discussion 10/6 with daughter [name].   Olene Floss, MD [place], PGY-2

## 2016-07-10 NOTE — Progress Notes (Signed)
Pt is on Bipap. Tried to switch on [place], only 2 hours pt tolerated, later developed tachypnea with nasal flaring.  RT notified; NT suction done and  Bipap was replaced.  Goiter (neck) is getting more enlarged since 2 days. Attempted to call for Cortak placement, no response back. No PO medicine were given due to absence of tube. Dr. Ashok Cordia is aware of situation. Family called and updated.

## 2016-07-10 NOTE — Progress Notes (Signed)
Palliative Medicine RN Note: Discussed pt at team rounds. PMT will sign off at this time as GOC are established and plan has been initiated. Please reconsult if needed. Thank you for allowing [place] to participate in this patient's care.  Marjie Skiff Kervin Bones, RN, BSN, [facility] 07/10/2016 9:16 AM Cell [phone] 8:00-4:00 Monday-Friday Office [phone]

## 2016-07-10 NOTE — Progress Notes (Signed)
Spoke with patient's daughter Ms. Tisdale who had requested an update from nurse. She wanted to know why patient needed re-intubation as had looked okay to her on nasal cannula over the weekend. Explained that her weakness, significant upper airway noise, and continued need of bipap were raised concerns that her mother could not protect her airway. Informed her tracheostomy would likely occur tomorrow. Ms. Beckey Downing requested an update as to when procedure would occur tomorrow and would like to give approval before it occurs. Will plan to call Ms. Tisdale 10/17 after morning rounds.   Olene Floss, MD [place], PGY-2

## 2016-07-10 NOTE — Progress Notes (Signed)
Spoke with patient's daughter Ms. Tisdale again after speaking with Dr. Titus Mould who would perform tracheostomy procedure. Explained current plan would be to perform tracheostomy around 9 a.m. tomorrow, 10/17. Reviewed possible risks of infection, bleeding, pneumothorax and death. Explained benefit of having a secure airway to prevent re-intubation. Daughter agreed that she would like to go forward with this procedure-- "anything that will help her mother breathe." She stated she would be speaking with family members who are RNs and MDs for their opinions but would let team know before tomorrow morning if she changes her mind.

## 2016-07-10 NOTE — Significant Event (Signed)
Feeding tube placed via left nare-marked at 100cm. Patient tolerated procedure. KUB ordered, pending. Shima Compere, Therapist, sports.

## 2016-07-10 NOTE — Procedures (Signed)
Intubation Procedure Note Angelica Beck [id number] 28-Oct-1950  Procedure: Intubation Indications: Respiratory insufficiency  Procedure Details Consent: Risks of procedure as well as the alternatives and risks of each were explained to the (patient/caregiver).  Consent for procedure obtained. Time Out: Verified patient identification, verified procedure, site/side was marked, verified correct patient position, special equipment/implants available, medications/allergies/relevent history reviewed, required imaging and test results available.  Performed  Maximum sterile technique was used including antiseptics, cap, gloves, hand hygiene and mask.  MAC  4 glide w/ 7.5 ett    Evaluation Hemodynamic Status: BP stable throughout; O2 sats: stable throughout Patient's Current Condition: stable Complications: No apparent complications Patient did tolerate procedure well. Chest X-ray ordered to verify placement.  CXR: pending.   Clementeen Graham 07/10/2016

## 2016-07-10 NOTE — Progress Notes (Addendum)
Nutrition Follow-up / Consult  DOCUMENTATION CODES:   Obesity unspecified  INTERVENTION:    Resume TF via Cortrak tube with Nepro at 15 ml/h (360 ml/day) with Prostat 60 ml TID to provide 1248 kcals, 119 gm protein, 262 ml free water daily  NUTRITION DIAGNOSIS:   Inadequate oral intake related to inability to eat as evidenced by NPO status.  Ongoing  GOAL:   Provide needs based on ASPEN/SCCM guidelines  Unmet with TF off.  MONITOR:   TF tolerance, Skin, I & O's, Labs, Weight trends, vent status  REASON FOR ASSESSMENT:   Consult Enteral/tube feeding initiation and management  ASSESSMENT:   65-y/o female with T2DM, CKD, recurrent UTIs 2/2 incontinence, and stroke with subsequent severe debilitation (ECOG 4), who presented with AMS and found to be in shock, likely 2/2 UTI sepsis.   Discussed patient in ICU rounds and with RN today. Patient is off CRRT, but continues to require intermittent HD 3 times per week. Patient was extubated, but required re-intubation today. Will likely require tracheostomy. Cortrak tube was placed earlier today. Tip of feeding tube is at the ligament of Treitz (post pyloric).  Received MD Consult for TF initiation and management. Labs reviewed: phosphorus elevated. Medications reviewed and include MVI, insulin.  Diet Order:  Diet NPO time specified  Skin:  Wound (see comment) (stage II pressure injury to lip)  Last BM:  10/15  Height:   Ht Readings from Last 1 Encounters:  06/20/16 '5\' 6"'$  (1.676 m)    Weight:   Wt Readings from Last 1 Encounters:  07/10/16 233 lb 4 oz (105.8 kg)    Ideal Body Weight:  59.1 kg  BMI:  Body mass index is 37.65 kg/m.  Estimated Nutritional Needs:   Kcal:  1324-4010  Protein:  >/= 118 gm  Fluid:  2 L  EDUCATION NEEDS:   No education needs identified at this time  Molli Barrows, [place], [place], [place] Pager [phone] After Hours Pager [phone]

## 2016-07-10 NOTE — Progress Notes (Signed)
[facility] ROUNDING NOTE   Subjective:   Interval History:  Patient nods appropriately   Objective:  Vital signs in last 24 hours:  Temp:  [97.4 F (36.3 C)-98.4 F (36.9 C)] 97.4 F (36.3 C) (10/16 0748) Pulse Rate:  [92-113] 92 (10/16 0900) Resp:  [17-33] 17 (10/16 0900) BP: (138-158)/(68-111) 158/87 (10/16 0900) SpO2:  [98 %-100 %] 100 % (10/16 0900) FiO2 (%):  [40 %] 40 % (10/16 0400) Weight:  [105.8 kg (233 lb 4 oz)] 105.8 kg (233 lb 4 oz) (10/16 0500)  Weight change: 0.8 kg (1 lb 12.2 oz) Filed Weights   07/08/16 1136 07/09/16 0500 07/10/16 0500  Weight: 105 kg (231 lb 7.7 oz) 105.5 kg (232 lb 9.4 oz) 105.8 kg (233 lb 4 oz)    Intake/Output: I/O last 3 completed shifts: In: 750 [I.V.:350; IV Piggyback:400] Out: 1 [Stool:1]   Intake/Output this shift:  No intake/output data recorded.  CVS- RRR  RS- Course breath sounds  ABD- BS present soft non-distended EXT- trace edema Left triple lumen    Basic Metabolic Panel:  Recent Labs Lab 07/06/16 0400 07/07/16 0436 07/08/16 0458 07/09/16 0500 07/10/16 0540  NA 135 135 132* 135 135  K 4.0 3.7 3.8 3.9 4.1  CL 98* 100* 98* 100* 99*  CO2 _0 21*  GLUCOSE 176* 135* 263* 125* 138*  BUN 125* 71* 101* 63* 82*  CREATININE 4.29* 2.94* 3.64* 2.82* 3.72*  CALCIUM 9.0 8.7* 8.8* 8.8* 9.1  MG 2.2 1.8 2.0 1.8 1.9  PHOS 3.3 3.0 4.6 4.3 6.8*    Liver Function Tests:  Recent Labs Lab 07/05/16 0345 07/06/16 0400 07/07/16 0436 07/08/16 0458 07/09/16 0500 07/10/16 0540  AST 145*  --  93*  --   --   --   ALT 99*  --  71*  --   --   --   ALKPHOS 360*  --  304*  --   --   --   BILITOT 0.7  --  0.5  --   --   --   PROT 5.7*  --  5.5*  --   --   --   ALBUMIN 1.5* 1.4* 1.4* 1.3* 1.4* 1.5*   No results for input(s): LIPASE, AMYLASE in the last 168 hours. No results for input(s): AMMONIA in the last 168 hours.  CBC:  Recent Labs Lab 07/06/16 0400 07/07/16 0436 07/08/16 0458  07/09/16 0500 07/10/16 0540  WBC 25.5* 16.2* 11.3* 11.5* 13.7*  NEUTROABS 22.4* 13.9* 9.3* 9.0* 11.2*  HGB 7.7* 7.7* 7.9* 8.0* 8.3*  HCT 23.2* 23.3* 23.6* 24.4* 25.5*  MCV 85.6 86.3 86.1 87.1 87.0  PLT 258 274 323 335 366    Cardiac Enzymes: No results for input(s): CKTOTAL, CKMB, CKMBINDEX, TROPONINI in the last 168 hours.  BNP: Invalid input(s): POCBNP  CBG:  Recent Labs Lab 07/09/16 1602 07/09/16 1955 07/09/16 2350 07/10/16 0359 07/10/16 0743  GLUCAP 143* 129* 123* 119* 57*    Microbiology: Results for orders placed or performed during the hospital encounter of 06/20/16  Blood Culture (routine x 2)     Status: None   Collection Time: 06/20/16 10:25 PM  Result Value Ref Range Status   Specimen Description BLOOD RIGHT HAND  Final   Special Requests IN PEDIATRIC BOTTLE 1ML  Final   Culture NO GROWTH 5 DAYS  Final   Report Status 06/25/2016 FINAL  Final  Blood Culture (routine x 2)     Status: Abnormal   Collection  Time: 06/20/16 10:29 PM  Result Value Ref Range Status   Specimen Description BLOOD LEFT ARM  Final   Special Requests BOTTLES DRAWN AEROBIC AND ANAEROBIC 5ML  Final   Culture  Setup Time   Final    GRAM POSITIVE COCCI IN CLUSTERS IN BOTH AEROBIC AND ANAEROBIC BOTTLES CRITICAL RESULT CALLED TO, READ BACK BY AND VERIFIED WITH: T. DANG, [place] ON 06/21/16 BY C. JESSUP, MLT.    Culture (A)  Final    STAPHYLOCOCCUS SPECIES (COAGULASE NEGATIVE) THE SIGNIFICANCE OF ISOLATING THIS ORGANISM FROM A SINGLE SET OF BLOOD CULTURES WHEN MULTIPLE SETS ARE DRAWN IS UNCERTAIN. PLEASE NOTIFY THE MICROBIOLOGY DEPARTMENT WITHIN ONE WEEK IF SPECIATION AND SENSITIVITIES ARE REQUIRED.    Report Status 06/23/2016 FINAL  Final  Urine culture     Status: Abnormal   Collection Time: 06/20/16 10:29 PM  Result Value Ref Range Status   Specimen Description IN/OUT CATH URINE  Final   Special Requests NONE  Final   Culture (A)  Final    5,000 COLONIES/mL ESCHERICHIA  COLI 1,000 COLONIES/mL KLEBSIELLA PNEUMONIAE    Report Status 06/23/2016 FINAL  Final   Organism ID, Bacteria ESCHERICHIA COLI (A)  Final   Organism ID, Bacteria KLEBSIELLA PNEUMONIAE (A)  Final      Susceptibility   Escherichia coli - MIC*    AMPICILLIN >=32 RESISTANT Resistant     CEFAZOLIN <=4 SENSITIVE Sensitive     CEFTRIAXONE <=1 SENSITIVE Sensitive     CIPROFLOXACIN 1 SENSITIVE Sensitive     GENTAMICIN <=1 SENSITIVE Sensitive     IMIPENEM <=0.25 SENSITIVE Sensitive     NITROFURANTOIN <=16 SENSITIVE Sensitive     TRIMETH/SULFA >=320 RESISTANT Resistant     AMPICILLIN/SULBACTAM 16 INTERMEDIATE Intermediate     PIP/TAZO <=4 SENSITIVE Sensitive     Extended ESBL NEGATIVE Sensitive     * 5,000 COLONIES/mL ESCHERICHIA COLI   Klebsiella pneumoniae - MIC*    AMPICILLIN >=32 RESISTANT Resistant     CEFAZOLIN <=4 SENSITIVE Sensitive     CEFTRIAXONE <=1 SENSITIVE Sensitive     CIPROFLOXACIN <=0.25 SENSITIVE Sensitive     GENTAMICIN <=1 SENSITIVE Sensitive     IMIPENEM <=0.25 SENSITIVE Sensitive     NITROFURANTOIN <=16 SENSITIVE Sensitive     TRIMETH/SULFA <=20 SENSITIVE Sensitive     AMPICILLIN/SULBACTAM 4 SENSITIVE Sensitive     PIP/TAZO <=4 SENSITIVE Sensitive     Extended ESBL NEGATIVE Sensitive     * 1,000 COLONIES/mL KLEBSIELLA PNEUMONIAE  Gram stain     Status: None   Collection Time: 06/20/16 10:29 PM  Result Value Ref Range Status   Specimen Description IN/OUT CATH URINE  Final   Special Requests NONE  Final   Gram Stain   Final    CYTOSPIN SMEAR SQUAMOUS EPITHELIAL CELLS PRESENT WBC PRESENT, PREDOMINANTLY MONONUCLEAR GRAM VARIABLE ROD    Report Status 06/20/2016 FINAL  Final  Blood Culture ID Panel (Reflexed)     Status: Abnormal   Collection Time: 06/20/16 10:29 PM  Result Value Ref Range Status   Enterococcus species NOT DETECTED NOT DETECTED Final   Listeria monocytogenes NOT DETECTED NOT DETECTED Final   Staphylococcus species DETECTED (A) NOT DETECTED Final     Comment: CRITICAL RESULT CALLED TO, READ BACK BY AND VERIFIED WITH: T. DANG, [place] ON 06/21/16 BY C. JESSUP, MLT.    Staphylococcus aureus NOT DETECTED NOT DETECTED Final   Methicillin resistance NOT DETECTED NOT DETECTED Final   Streptococcus species NOT DETECTED  NOT DETECTED Final   Streptococcus agalactiae NOT DETECTED NOT DETECTED Final   Streptococcus pneumoniae NOT DETECTED NOT DETECTED Final   Streptococcus pyogenes NOT DETECTED NOT DETECTED Final   Acinetobacter baumannii NOT DETECTED NOT DETECTED Final   Enterobacteriaceae species NOT DETECTED NOT DETECTED Final   Enterobacter cloacae complex NOT DETECTED NOT DETECTED Final   Escherichia coli NOT DETECTED NOT DETECTED Final   Klebsiella oxytoca NOT DETECTED NOT DETECTED Final   Klebsiella pneumoniae NOT DETECTED NOT DETECTED Final   Proteus species NOT DETECTED NOT DETECTED Final   Serratia marcescens NOT DETECTED NOT DETECTED Final   Haemophilus influenzae NOT DETECTED NOT DETECTED Final   Neisseria meningitidis NOT DETECTED NOT DETECTED Final   Pseudomonas aeruginosa NOT DETECTED NOT DETECTED Final   Candida albicans NOT DETECTED NOT DETECTED Final   Candida glabrata NOT DETECTED NOT DETECTED Final   Candida krusei NOT DETECTED NOT DETECTED Final   Candida parapsilosis NOT DETECTED NOT DETECTED Final   Candida tropicalis NOT DETECTED NOT DETECTED Final  MRSA PCR Screening     Status: None   Collection Time: 06/21/16 12:53 AM  Result Value Ref Range Status   MRSA by PCR NEGATIVE NEGATIVE Final    Comment:        The GeneXpert MRSA Assay (FDA approved for NASAL specimens only), is one component of a comprehensive MRSA colonization surveillance program. It is not intended to diagnose MRSA infection nor to guide or monitor treatment for MRSA infections.   C difficile quick scan w PCR reflex     Status: Abnormal   Collection Time: 07/04/16 10:10 AM  Result Value Ref Range Status   C Diff antigen POSITIVE  (A) NEGATIVE Final   C Diff toxin POSITIVE (A) NEGATIVE Final   C Diff interpretation Toxin producing C. difficile detected.  Final    Comment: CRITICAL RESULT CALLED TO, READ BACK BY AND VERIFIED WITH: L. Flood RN 11:45 07/04/16 ([email or web address])     Coagulation Studies: No results for input(s): LABPROT, INR in the last 72 hours.  Urinalysis: No results for input(s): COLORURINE, LABSPEC, PHURINE, GLUCOSEU, HGBUR, BILIRUBINUR, KETONESUR, PROTEINUR, UROBILINOGEN, NITRITE, LEUKOCYTESUR in the last 72 hours.  Invalid input(s): APPERANCEUR    Imaging: No results found.   Medications:     . aspirin  162 mg Per Tube Daily  . chlorhexidine gluconate (MEDLINE KIT)  15 mL Mouth Rinse BID  . darbepoetin (ARANESP) injection - DIALYSIS  100 mcg Intravenous Q Mon-HD  . famotidine  20 mg Per Tube Daily  . feeding supplement (VITAL HIGH PROTEIN)  1,000 mL Per Tube Q24H  . [START ON 07/11/2016] heparin subcutaneous  5,000 Units Subcutaneous Q8H  . insulin aspart  0-9 Units Subcutaneous Q4H  . mouth rinse  15 mL Mouth Rinse QID  . methimazole  5 mg Per Tube Daily  . metronidazole  500 mg Intravenous Q8H  . multivitamin  15 mL Oral Daily  . vancomycin  500 mg Oral Q6H   sodium chloride, Place/Maintain arterial line **AND** sodium chloride, sodium chloride, sodium chloride, sodium chloride, sodium chloride, alteplase, lidocaine (PF), lidocaine-prilocaine, pentafluoroprop-tetrafluoroeth  Assessment/ Plan:   Patient with history of CVA and debilitation and diabetes and chronic renal disease  Palliative care involved  CKD stage 3 baseline now with acute renal failure from ATN  Minimal urine output and continues to be dialysis dependent  1. Acute Renal Failure    Baseline creatinine 1.5    Secondary to ATN       Continue  dialysis TTS schedule 2. HTN  Appears hemodynamically stable and no pressors 3. Hypoxic renal Failure  Continues to be dependent on BIPAP  4. Anemia  Stable  Darbepoietin 100 mcg  weekly     Hb 8.3 5. C Diff 10/10 started on Vancomycin and Flagyl   LOS: 20 Lise Pincus W _0 _1 :59 AM

## 2016-07-11 ENCOUNTER — Encounter (HOSPITAL_COMMUNITY): Payer: Self-pay | Admitting: General Surgery

## 2016-07-11 LAB — BLOOD GAS, ARTERIAL
ACID-BASE DEFICIT: 4.5 mmol/L — AB (ref 0.0–2.0)
BICARBONATE: 20 mmol/L (ref 20.0–28.0)
Drawn by: 44898
FIO2: 40
LHR: 15 {breaths}/min
MECHVT: 470 mL
O2 SAT: 97.8 %
PATIENT TEMPERATURE: 98.6
PEEP/CPAP: 5 cmH2O
PO2 ART: 119 mmHg — AB (ref 83.0–108.0)
pCO2 arterial: 36.5 mmHg (ref 32.0–48.0)
pH, Arterial: 7.358 (ref 7.350–7.450)

## 2016-07-11 LAB — GLUCOSE, CAPILLARY
GLUCOSE-CAPILLARY: 134 mg/dL — AB (ref 65–99)
GLUCOSE-CAPILLARY: 169 mg/dL — AB (ref 65–99)
GLUCOSE-CAPILLARY: 205 mg/dL — AB (ref 65–99)
Glucose-Capillary: 216 mg/dL — ABNORMAL HIGH (ref 65–99)
Glucose-Capillary: 180 mg/dL — ABNORMAL HIGH (ref 65–99)
Glucose-Capillary: 162 mg/dL — ABNORMAL HIGH (ref 65–99)
Glucose-Capillary: 136 mg/dL — ABNORMAL HIGH (ref 65–99)

## 2016-07-11 LAB — CBC
HEMATOCRIT: 27.1 % — AB (ref 36.0–46.0)
HEMOGLOBIN: 9 g/dL — AB (ref 12.0–15.0)
MCH: 28.7 pg (ref 26.0–34.0)
MCHC: 33.2 g/dL (ref 30.0–36.0)
MCV: 86.3 fL (ref 78.0–100.0)
Platelets: 459 10*3/uL — ABNORMAL HIGH (ref 150–400)
RBC: 3.14 MIL/uL — ABNORMAL LOW (ref 3.87–5.11)
RDW: 15.9 % — AB (ref 11.5–15.5)
WBC: 22.2 10*3/uL — ABNORMAL HIGH (ref 4.0–10.5)

## 2016-07-11 LAB — CBC WITH DIFFERENTIAL/PLATELET
Basophils Absolute: 0 10*3/uL (ref 0.0–0.1)
Basophils Relative: 0 %
EOS PCT: 1 %
Eosinophils Absolute: 0.2 10*3/uL (ref 0.0–0.7)
HEMATOCRIT: 24.1 % — AB (ref 36.0–46.0)
HEMOGLOBIN: 7.8 g/dL — AB (ref 12.0–15.0)
LYMPHS ABS: 1.1 10*3/uL (ref 0.7–4.0)
LYMPHS PCT: 7 %
MCH: 27.9 pg (ref 26.0–34.0)
MCHC: 32.4 g/dL (ref 30.0–36.0)
MCV: 86.1 fL (ref 78.0–100.0)
MONOS PCT: 4 %
Monocytes Absolute: 0.6 10*3/uL (ref 0.1–1.0)
NEUTROS PCT: 88 %
Neutro Abs: 13.9 10*3/uL — ABNORMAL HIGH (ref 1.7–7.7)
Platelets: 378 10*3/uL (ref 150–400)
RBC: 2.8 MIL/uL — AB (ref 3.87–5.11)
RDW: 15.8 % — ABNORMAL HIGH (ref 11.5–15.5)
WBC: 15.8 10*3/uL — AB (ref 4.0–10.5)

## 2016-07-11 LAB — PROTIME-INR
INR: 1.32
PROTHROMBIN TIME: 16.5 s — AB (ref 11.4–15.2)

## 2016-07-11 LAB — RENAL FUNCTION PANEL
ALBUMIN: 1.5 g/dL — AB (ref 3.5–5.0)
Anion gap: 14 (ref 5–15)
BUN: 106 mg/dL — AB (ref 6–20)
CALCIUM: 8.6 mg/dL — AB (ref 8.9–10.3)
CO2: 22 mmol/L (ref 22–32)
CREATININE: 4.38 mg/dL — AB (ref 0.44–1.00)
Chloride: 102 mmol/L (ref 101–111)
GFR calc Af Amer: 11 mL/min — ABNORMAL LOW (ref >60)
GFR, EST NON AFRICAN AMERICAN: 10 mL/min — AB (ref >60)
Glucose, Bld: 219 mg/dL — ABNORMAL HIGH (ref 65–99)
PHOSPHORUS: 7.5 mg/dL — AB (ref 2.5–4.6)
Potassium: 3.9 mmol/L (ref 3.5–5.1)
Sodium: 138 mmol/L (ref 135–145)

## 2016-07-11 LAB — MAGNESIUM: Magnesium: 2 mg/dL (ref 1.7–2.4)

## 2016-07-11 LAB — APTT: APTT: 30 s (ref 24–36)

## 2016-07-11 MED ORDER — INSULIN DETEMIR 100 UNIT/ML ~~LOC~~ SOLN
10.0000 [IU] | Freq: Two times a day (BID) | SUBCUTANEOUS | Status: DC
  Administered 2016-07-11 – 2016-07-12 (×3): 10 [IU] via SUBCUTANEOUS
  Filled 2016-07-11 (×5): qty 0.1

## 2016-07-11 MED ORDER — METOPROLOL TARTRATE 25 MG/10 ML ORAL SUSPENSION
25.0000 mg | Freq: Two times a day (BID) | ORAL | Status: DC
  Administered 2016-07-11 – 2016-07-18 (×13): 25 mg
  Filled 2016-07-11 (×17): qty 10

## 2016-07-11 MED ORDER — ALTEPLASE 2 MG IJ SOLR: 2.0000 mg | Freq: Once | INTRAMUSCULAR | Status: DC | PRN

## 2016-07-11 MED ORDER — HEPARIN SODIUM (PORCINE) 1000 UNIT/ML DIALYSIS: 1000.0000 [IU] | INTRAMUSCULAR | Status: DC | PRN

## 2016-07-11 MED ORDER — DARBEPOETIN ALFA 100 MCG/0.5ML IJ SOSY
PREFILLED_SYRINGE | INTRAMUSCULAR | Status: AC
  Filled 2016-07-11: qty 0.5

## 2016-07-11 MED ORDER — SODIUM CHLORIDE 0.9 % IV SOLN: 100.0000 mL | INTRAVENOUS | Status: DC | PRN

## 2016-07-11 MED ORDER — PENTAFLUOROPROP-TETRAFLUOROETH EX AERO: 1.0000 "application " | INHALATION_SPRAY | CUTANEOUS | Status: DC | PRN

## 2016-07-11 MED ORDER — HEPARIN SODIUM (PORCINE) 5000 UNIT/ML IJ SOLN
5000.0000 [IU] | Freq: Three times a day (TID) | INTRAMUSCULAR | Status: DC
  Administered 2016-07-12 – 2016-07-13 (×5): 5000 [IU] via SUBCUTANEOUS
  Filled 2016-07-11 (×5): qty 1

## 2016-07-11 MED ORDER — LIDOCAINE-PRILOCAINE 2.5-2.5 % EX CREA: 1.0000 "application " | TOPICAL_CREAM | CUTANEOUS | Status: DC | PRN

## 2016-07-11 MED ORDER — LIDOCAINE HCL (PF) 1 % IJ SOLN: 5.0000 mL | INTRAMUSCULAR | Status: DC | PRN

## 2016-07-11 NOTE — Progress Notes (Signed)
PULMONARY / CRITICAL CARE MEDICINE   Name: Angelica Beck MRN: [id number] DOB: 1951/05/02    ADMISSION DATE:  06/20/2016 CONSULTATION DATE:  06/20/2016  REFERRING MD:  EDP Dr Cathleen Fears   CHIEF COMPLAINT:  AMS  HISTORY OF PRESENT ILLNESS:   65 year old female with past medical history as below, which is significant for stroke with subsequent debilitation, diabetes, chronic kidney disease, chronic urinary tract infections due to incontinence, for which she takes chronic antibiotics. She lives at home with her daughter where she requires full assistance she is unable to truly ambulate and spends most of her time in a hospital bed/recliner. She relies on family members almost entirely for activities of daily living. She was in her usual state of health until about 9/23 when she developed nausea and vomiting. This persisted for 2 days until symptoms subsided, however, she continued to complain of abdominal pain. 9/26 when the patient's daughter got home from work the patient was found to be unresponsive. EMS was called and she was found hypoglycemic which was corrected, however, altered mental status did not improve. Upon arrival to the emergency department she continued to be lethargic and was found to be hypotensive with hypoxemia. Laboratory evaluation significant for potassium 7.4, bicarbonate less than 7, creatinine 6.51, BUN 95, WBC 12.3, lactic acid 13.88, pH 7.07.  SUBJECTIVE:  Reintubated 10/16. Patient nods no to any chest pain or difficulty breathing at this time. Nods no to any nausea or belly pain.   REVIEW OF SYSTEMS:  Denies pain. Denies trouble breathing.   VITAL SIGNS: BP 120/61   Pulse (!) 102   Temp 98.3 F (36.8 C) (Oral)   Resp (!) 22   Ht '5\' 6"'$  (1.676 m)   Wt 230 lb 6.1 oz (104.5 kg)   SpO2 100%   BMI 37.18 kg/m   HEMODYNAMICS:    VENTILATOR SETTINGS: Vent Mode: PRVC FiO2 (%):  [40 %-100 %] 40 % Set Rate:  [15 bmp] 15 bmp Vt Set:  [470 mL] 470 mL PEEP:  [5  cmH20] 5 cmH20 Plateau Pressure:  [16 cmH20-23 cmH20] 23 cmH20  INTAKE / OUTPUT: I/O last 3 completed shifts: In: 904.5 [I.V.:120; Other:110; NG/GT:474.5; IV Piggyback:200] Out: 0   PHYSICAL EXAMINATION: General:  Obese female. ETT in place. No family at bedside. Neuro: Nods appropriately. Tracks to voice. Following commands. HEENT:  No scleral icterus. Goiter noted. Cardiovascular:  Regular rhythm. Ansarca persists but is improving. Pulmonary: Normal work of breathing. Good aeration bilaterally. Coarse upper airway sounds. Abdomen:  Soft. Protuberant. Normoactive bowel sounds. Musculoskeletal:  No joint deformity or effusion. Lifting head off of bed. Skin:  Warm & dry. No rash on exposed skin.  LABS:  BMET  Recent Labs Lab 07/09/16 0500 07/10/16 0540 07/11/16 0400  NA 135 135 138  K 3.9 4.1 3.9  CL 100* 99* 102  CO2 24 21* 22  BUN 63* 82* 106*  CREATININE 2.82* 3.72* 4.38*  GLUCOSE 125* 138* 219*    Electrolytes  Recent Labs Lab 07/09/16 0500 07/10/16 0540 07/11/16 0400  CALCIUM 8.8* 9.1 8.6*  MG 1.8 1.9 2.0  PHOS 4.3 6.8* 7.5*    CBC  Recent Labs Lab 07/09/16 0500 07/10/16 0540 07/11/16 0400  WBC 11.5* 13.7* 15.8*  HGB 8.0* 8.3* 7.8*  HCT 24.4* 25.5* 24.1*  PLT 335 366 378    Coag's  Recent Labs Lab 07/10/16 1300 07/11/16 0400  APTT  --  30  INR 1.16 1.32    Sepsis Markers No results for input(s):  LATICACIDVEN, PROCALCITON, O2SATVEN in the last 168 hours.  ABG  Recent Labs Lab 07/08/16 2008 07/10/16 1229 07/10/16 1432  PHART 7.445 7.324* 7.391  PCO2ART 37.9 38.6 33.4  PO2ART 104.0 121* 240.0*    Liver Enzymes  Recent Labs Lab 07/05/16 0345  07/07/16 0436  07/09/16 0500 07/10/16 0540 07/11/16 0400  AST 145*  --  93*  --   --   --   --   ALT 99*  --  71*  --   --   --   --   ALKPHOS 360*  --  304*  --   --   --   --   BILITOT 0.7  --  0.5  --   --   --   --   ALBUMIN 1.5*  < > 1.4*  < > 1.4* 1.5* 1.5*  < > = values in  this interval not displayed.  Cardiac Enzymes No results for input(s): TROPONINI, PROBNP in the last 168 hours.  Glucose  Recent Labs Lab 07/10/16 0743 07/10/16 1148 07/10/16 1553 07/10/16 1953 07/11/16 0004 07/11/16 0357  GLUCAP 133* 130* 171* 219* 205* 216*    Imaging Dg Chest Port 1 View  Addendum Date: 07/10/2016   ADDENDUM REPORT: 07/10/2016 14:19 ADDENDUM: Study discussed by telephone with Dr. Brand Males on 07/10/2016 at 1410 hours. Electronically Signed   By: Genevie Ann M.D.   On: 07/10/2016 14:19   Result Date: 07/10/2016 CLINICAL DATA:  65 year old female with respiratory failure. Intubated. Initial encounter. EXAM: PORTABLE CHEST 1 VIEW COMPARISON:  07/07/2016 and earlier. FINDINGS: Portable AP semi upright view at 1330 hours today. Right mainstem intubation now. The ET tube should be pulled back 4 cm to allow for placement above the carina and below the clavicles. Stable bilateral IJ approach catheters, that from the right courses into the left innominate vein as before. Stable visible enteric tube. Continued dense opacification in the left mid and lower lung with some air bronchograms. Stable cardiac size and mediastinal contours. No pneumothorax, pulmonary edema, or right pleural effusion. IMPRESSION: 1. Right mainstem bronchus intubation.  Retract the ET Tube 4 cm. 2. Right lower lobe collapse or consolidation. 3. Otherwise stable lines and tubes. Note that the right IJ approach catheter still terminates in the left innominate vein. Electronically Signed: By: Genevie Ann M.D. On: 07/10/2016 14:04   Dg Abd Portable 1v  Result Date: 07/10/2016 CLINICAL DATA:  Feeding tube insertion EXAM: PORTABLE ABDOMEN - 1 VIEW COMPARISON:  Portable exam 1128 hours compared to 06/24/2016 FINDINGS: Tip of feeding tube projects over ligament of Treitz. Decreased small bowel distention. No bowel dilatation or definite wall thickening. 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 LEFT lower lobe atelectasis versus consolidation. IMPRESSION: Tip of feeding tube projects over ligament of Treitz. Electronically Signed   By: Lavonia Dana M.D.   On: 07/10/2016 11:36    STUDIES:  CT Head 9/27:  No acute abnormality. Chronic small vessel ischemic changes. Chronic lacunar infarct left basal ganglia & chronic ischemia right side of pons.  CT Abd/Pelvis w/o 9/27:  Early partial SBO w/ transition point LLQ with adhesions. Left humerus soft tissue swelling & intramuscular gas. Punctate hepatic granulomas noted.  Left Arm X-ray 9/27:  Diffuse soft tissue edema.  TTE 9/27:  LV w/ EF 60-65%. Grade 1 Diastolic dysfunction. No regional wall motion abnormalities. LA & RA normal in size. RV normal in size & function. Mild AS w/o AR. Mild MR w/o  MS.  ABIs 10/3: R normal. L posterior artery noncompressible; suspect calcification.  CT head 10/3: No acute infarct, no hemorrhage or mass. Atrophy evident. Similar to prior study.   MICROBIOLOGY: MRSA PCR 9/27:  Negative Blood Ctx x 2 9/26: Coag Neg Staph 1/2 Urine Ctx 9/26:  5000 CFU E coli & 1000 CFU Klebsiella  C diff PCR 10/10:  Toxin & Antigen Positive  ANTIBIOTICS: Levaquin 9/26 - 9/28 Aztreonam 9/26 - 9/27 Vancomycin 9/26 - off Merrem 9/27 - 10/3 PO Vancocin 10/10 >>\ Flagyl IV 10/15 (while NGT is out) > 10/16  SIGNIFICANT EVENTS: 9/26 - admit for presumed septic shock  LINES/TUBES: OETT 7.5 9/26 - 10/14 OGT 9/26 - 10/14 L IJ CVL 9/26 >> Foley 9/26 >> RIJ HD 9/30 >> Rectal pouch 10/17 >> NG tube 10/17 >>  DISCUSSION: 65 y.o. female with T2DM, CKD, recurrent UTIs 2/2 incontinence, and stroke with subsequent severe debilitation (Left sided weakness; ECOG 4), who presented with AMS and found to be in shock, likely 2/2 UTI sepsis. Some improvement in level of alertness with CRRT. Completed 7-day course of meropenem 10/3. Extubated 10/14 but requiring intermittent noninvasive positive  pressure ventilation. Also being treated for C diff, on day 7 of PO vancomycin.   ASSESSMENT / PLAN:  PULMONARY A: Acute Hypoxic Respiratory Failure - Reintubated due to inability to protect airway with severe deconditioning and weakness. Positive almost 6 L. OHS  P:   Planning for tracheostomy; will need to consult ENT rather than bedside procedure because of large goiter Obtain daily CXR Nastotracheal suctioning Incentive Spirometry  Weaning FiO2 for sat >92%  CARDIOVASCULAR A:  Shock - Likely sepsis. Cortisol 45.1. Resolved. H/O HTN & HLD  P:  Vitals per unit protocol Continuous telemetry monitoring Diuresis with HD TThSa  RENAL A:   Acute On Chronic Renal Failure Stage IV  - worsens without HD treatments Profound Metabolic Acidosis - Resolved w/ HD. Hyperkalemia - Resolved.  Lactic Acidosis - Resolved. Likely due to hypoperfusion in setting of Metformin.   Hyperphosphatemia - Secondary to acute renal failure.  Pseudohypocalcemia - Resolved.   P:   Appreciate Nephrology recommendations Next dialysis session planned for today, 10/17 Trending electrolytes daily Replacing electrolytes as indicated Plan for perm-a-cath placement after breathing status improves/tracheostomy  GASTROINTESTINAL A:   Partial SBO - Resolving on XR abd 9/30. Having BMs. H/O Ventral Hernia Repair. Possible Liver Cirrhosis. RUQ U/S normal. Ammonia WNL 10/5.  Transaminitis - Likely shock liver versus congestive hepatopathy. Improving.  Diarrhea - Secondary to C diff.   P:   NGT placed Hold tube feeds for possible ENT procedure Pepcid VT daily once replaced Trending LFTs Intermittently   HEMATOLOGIC A:   Anemia - Chronic. No signs of active bleeding. S/P 1u on 10/6 & 1u on 10/11. Hgb relatively stable. Leukocytosis - Likely sepsis. Resolving. 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 Unlikely with neg SRA. Thrombocytopenia - Resolved.  P:  Trending cell counts daily w/ CBC Transfusion goal of  7.0 SCDs Heparin [place] q8hr  INFECTIOUS A:   Severe Sepsis  C Diff Colitis - Found 10/10 & started PO Vancocin. E.coli and Klebsiella UTI - S/P 7 days Merrem. Sacral wound  P:   PO/OGT Flagyl IV day #3; s/p 7 days Vancomycin but lost NG access Holding home Trimethoprim. Wound care consulted Plan to re-culture for fever   ENDOCRINE A:   Hypoglycemia - Resolved with Dextrose IVF. H/O DM Type 2 - A1c 5.8. H/O Hyperthyroidism - On PTU at home. TSH  0.331 & F T4 1.33 9/26. Repeat TSH 0.458 & FT4 1.78 on 10/14.  P:   SSI per Sensitive Algorithm. Holding NPH with tube feedings off Holding PTU & Metformin. Methimazole '5mg'$  VT daily  NEUROLOGIC A:   Acute Encephalopathy - Question hepatic vs toxic metabolic vs hypoxia. No improvement in Lactulose Enema despite improved Ammonia.  Favor toxic metabolic as is gradually improving with CRRT. Repeat CT head unchanged. TSH WNL. Folate WNL. H/O CVA - Minimal mobility.  P:   Avoid sedating meds Discontinue Fentanyl  MUSCULOSKELETAL A: Edema Left Arm - Improved. Secondary to IV infiltration. No gas on X-ray 9/27.  P: Continue to monitor   FAMILY  - Updates: No family at bedside 10/16.  - Inter-disciplinary family meet or Palliative Care meeting due by: Continued Lane discussion 10/6 with daughter [name].   Olene Floss, MD [place], PGY-2

## 2016-07-11 NOTE — Procedures (Signed)
I have seen and examined this patient and agree with the plan of care   Seen on dialysis no issues at this time.  Potassium 3.9    Bicarbonate 22  Phosphorus 7.5   Hb 7.8    Hemodynamically stable BP 120/70  Continues with fluid removal    Russ Looper W 07/11/2016, 9:35 AM

## 2016-07-11 NOTE — Progress Notes (Signed)
Spoke with patient's daughter Ms. Tisdale about change of plans for tracheostomy. Informed her that ENT has been consulted to consider performing the procedure in the OR, rather than at bedside due to large goiter. She expressed understanding and is agreement with the plan to pursue tracheostomy.   Olene Floss, MD [place], PGY-2

## 2016-07-11 NOTE — Consult Note (Signed)
Chief Complaint: Acute on chronic renal failure  Referring Physician:Dr. Zorita Pang  Supervising Physician: Corrie Mckusick  Patient Status: In-pt   HPI: Angelica Beck is an 65 y.o. female who has been admitted secondary to hypotension and hypoxemia after a hypoglycemic event. She was felt to be septic, likely from a urinary source as she has chronic UTIs and takes chronic abx therapy.  During her stay, her CRF has become more acute requiring CRRT and now conventional HD.  She has a temporary catheter in place and now needs a perm cath for more long-term use.  She has recently been intubated again and will require a tracheostomy when ENT is available to do this.  Past Medical History:  Past Medical History:  Diagnosis Date  . Arthritis   . Diabetes mellitus, type 2 ([place])   . Hyperlipidemia   . Hypertension   . Hyperthyroidism   . Obesity hypoventilation syndrome ([place]) 06/20/2013  . Stroke [facility])     Past Surgical History:  Past Surgical History:  Procedure Laterality Date  . VENTRAL HERNIA REPAIR      Family History:  Family History  Problem Relation Age of Onset  . Diabetes Mother     Social History:  reports that she quit smoking about 13 years ago. Her smoking use included Cigarettes. She has a 7.50 pack-year smoking history. She has never used smokeless tobacco. She reports that she does not drink alcohol or use drugs.  Allergies:  Allergies  Allergen Reactions  . Lactose Intolerance (Gi) Diarrhea and Nausea And Vomiting  . Penicillins Swelling    Facial swelling    Medications: Medications reviewed in Epic  Please HPI for pertinent positives, otherwise complete 10 system ROS negative.  Mallampati Score: MD Evaluation Airway: Other (comments) Airway comments: ETT in place, awaiting trach Heart: WNL Abdomen: WNL Chest/ Lungs: WNL ASA  Classification: 4  Physical Exam: BP 125/72   Pulse (!) 122   Temp 98.9 F (37.2 C) (Oral)   Resp (!) 21    Ht _0  (1.676 m)   Wt 236 lb 15.9 oz (107.5 kg)   SpO2 100%   BMI 38.25 kg/m  Body mass index is 38.25 kg/m. General: arousable obese black female who is laying in bed in NAD HEENT: head is normocephalic, atraumatic.  Sclera are noninjected.  PERRL.  Ears and nose without any masses or lesions.  Mouth with ETT in place. Temp cath in place in right IJ Heart: regular rhythm, but tachycardic.  Normal s1,s2. No obvious murmurs, gallops, or rubs noted.  Palpable radial pulses bilaterally Lungs: course breath sounds throughout.  ETT in place and vented Psych: arousable but unable to assess fully   Labs: Results for orders placed or performed during the hospital encounter of 06/20/16 (from the past 48 hour(s))  Glucose, capillary     Status: Abnormal   Collection Time: 07/09/16 12:14 PM  Result Value Ref Range   Glucose-Capillary 133 (H) 65 - 99 mg/dL   Comment 1 Notify RN    Comment 2 Document in Chart   Glucose, capillary     Status: Abnormal   Collection Time: 07/09/16  4:02 PM  Result Value Ref Range   Glucose-Capillary 143 (H) 65 - 99 mg/dL  Glucose, capillary     Status: Abnormal   Collection Time: 07/09/16  7:55 PM  Result Value Ref Range   Glucose-Capillary 129 (H) 65 - 99 mg/dL   Comment 1 Notify RN   Glucose, capillary  Status: Abnormal   Collection Time: 07/09/16 11:50 PM  Result Value Ref Range   Glucose-Capillary 123 (H) 65 - 99 mg/dL  Glucose, capillary     Status: Abnormal   Collection Time: 07/10/16  3:59 AM  Result Value Ref Range   Glucose-Capillary 119 (H) 65 - 99 mg/dL   Comment 1 Notify RN   CBC with Differential/Platelet     Status: Abnormal   Collection Time: 07/10/16  5:40 AM  Result Value Ref Range   WBC 13.7 (H) 4.0 - 10.5 K/uL   RBC 2.93 (L) 3.87 - 5.11 MIL/uL   Hemoglobin 8.3 (L) 12.0 - 15.0 g/dL    Comment: CONSISTENT WITH PREVIOUS RESULT   HCT 25.5 (L) 36.0 - 46.0 %   MCV 87.0 78.0 - 100.0 fL   MCH 28.3 26.0 - 34.0 pg   MCHC 32.5 30.0 -  36.0 g/dL   RDW 15.7 (H) 11.5 - 15.5 %   Platelets 366 150 - 400 K/uL    Comment: CONSISTENT WITH PREVIOUS RESULT   Neutrophils Relative % 81 %   Lymphocytes Relative 11 %   Monocytes Relative 6 %   Eosinophils Relative 1 %   Basophils Relative 1 %   Neutro Abs 11.2 (H) 1.7 - 7.7 K/uL   Lymphs Abs 1.5 0.7 - 4.0 K/uL   Monocytes Absolute 0.8 0.1 - 1.0 K/uL   Eosinophils Absolute 0.1 0.0 - 0.7 K/uL   Basophils Absolute 0.1 0.0 - 0.1 K/uL   WBC Morphology MILD LEFT SHIFT (1-5% METAS, OCC MYELO, OCC BANDS)   Magnesium     Status: None   Collection Time: 07/10/16  5:40 AM  Result Value Ref Range   Magnesium 1.9 1.7 - 2.4 mg/dL  Renal function panel     Status: Abnormal   Collection Time: 07/10/16  5:40 AM  Result Value Ref Range   Sodium 135 135 - 145 mmol/L   Potassium 4.1 3.5 - 5.1 mmol/L   Chloride 99 (L) 101 - 111 mmol/L   CO2 21 (L) 22 - 32 mmol/L   Glucose, Bld 138 (H) 65 - 99 mg/dL   BUN 82 (H) 6 - 20 mg/dL   Creatinine, Ser 3.72 (H) 0.44 - 1.00 mg/dL   Calcium 9.1 8.9 - 10.3 mg/dL   Phosphorus 6.8 (H) 2.5 - 4.6 mg/dL   Albumin 1.5 (L) 3.5 - 5.0 g/dL   GFR calc non Af Amer 12 (L) >60 mL/min   GFR calc Af Amer 14 (L) >60 mL/min    Comment: (NOTE) The eGFR has been calculated using the CKD EPI equation. This calculation has not been validated in all clinical situations. eGFR's persistently <60 mL/min signify possible Chronic Kidney Disease.    Anion gap 15 5 - 15  Glucose, capillary     Status: Abnormal   Collection Time: 07/10/16  7:43 AM  Result Value Ref Range   Glucose-Capillary 133 (H) 65 - 99 mg/dL   Comment 1 Notify RN   Glucose, capillary     Status: Abnormal   Collection Time: 07/10/16 11:48 AM  Result Value Ref Range   Glucose-Capillary 130 (H) 65 - 99 mg/dL   Comment 1 Notify RN   Blood gas, arterial     Status: Abnormal   Collection Time: 07/10/16 12:29 PM  Result Value Ref Range   FIO2 0.40    Delivery systems BILEVEL POSITIVE AIRWAY PRESSURE     Mode BILEVEL POSITIVE AIRWAY PRESSURE    LHR 15.0 resp/min  Peep/cpap 5.0 cm H20   Inspiratory PAP 7.0    pH, Arterial 7.324 (L) 7.350 - 7.450   pCO2 arterial 38.6 32.0 - 48.0 mmHg   pO2, Arterial 121 (H) 83.0 - 108.0 mmHg   Bicarbonate 19.5 (L) 20.0 - 28.0 mmol/L   Acid-base deficit 5.5 (H) 0.0 - 2.0 mmol/L   O2 Saturation 98.0 %   Patient temperature 98.6    Collection site LEFT RADIAL    Drawn by [id number]    Sample type ARTERIAL DRAW    Allens test (pass/fail) PASS PASS  Protime-INR     Status: None   Collection Time: 07/10/16  1:00 PM  Result Value Ref Range   Prothrombin Time 14.9 11.4 - 15.2 seconds   INR 1.16   I-STAT 3, arterial blood gas (G3+)     Status: Abnormal   Collection Time: 07/10/16  2:32 PM  Result Value Ref Range   pH, Arterial 7.391 7.350 - 7.450   pCO2 arterial 33.4 32.0 - 48.0 mmHg   pO2, Arterial 240.0 (H) 83.0 - 108.0 mmHg   Bicarbonate 20.5 20.0 - 28.0 mmol/L   TCO2 22 0 - 100 mmol/L   O2 Saturation 100.0 %   Acid-base deficit 4.0 (H) 0.0 - 2.0 mmol/L   Patient temperature 97.2 F    Collection site RADIAL, ALLEN'S TEST ACCEPTABLE    Sample type ARTERIAL   Glucose, capillary     Status: Abnormal   Collection Time: 07/10/16  3:53 PM  Result Value Ref Range   Glucose-Capillary 171 (H) 65 - 99 mg/dL   Comment 1 Notify RN   Glucose, capillary     Status: Abnormal   Collection Time: 07/10/16  7:53 PM  Result Value Ref Range   Glucose-Capillary 219 (H) 65 - 99 mg/dL   Comment 1 Notify RN    Comment 2 Document in Chart   Glucose, capillary     Status: Abnormal   Collection Time: 07/11/16 12:04 AM  Result Value Ref Range   Glucose-Capillary 205 (H) 65 - 99 mg/dL   Comment 1 Notify RN    Comment 2 Document in Chart   Glucose, capillary     Status: Abnormal   Collection Time: 07/11/16  3:57 AM  Result Value Ref Range   Glucose-Capillary 216 (H) 65 - 99 mg/dL   Comment 1 Notify RN    Comment 2 Document in Chart   CBC with Differential/Platelet      Status: Abnormal   Collection Time: 07/11/16  4:00 AM  Result Value Ref Range   WBC 15.8 (H) 4.0 - 10.5 K/uL   RBC 2.80 (L) 3.87 - 5.11 MIL/uL   Hemoglobin 7.8 (L) 12.0 - 15.0 g/dL   HCT 24.1 (L) 36.0 - 46.0 %   MCV 86.1 78.0 - 100.0 fL   MCH 27.9 26.0 - 34.0 pg   MCHC 32.4 30.0 - 36.0 g/dL   RDW 15.8 (H) 11.5 - 15.5 %   Platelets 378 150 - 400 K/uL   Neutrophils Relative % 88 %   Lymphocytes Relative 7 %   Monocytes Relative 4 %   Eosinophils Relative 1 %   Basophils Relative 0 %   Neutro Abs 13.9 (H) 1.7 - 7.7 K/uL   Lymphs Abs 1.1 0.7 - 4.0 K/uL   Monocytes Absolute 0.6 0.1 - 1.0 K/uL   Eosinophils Absolute 0.2 0.0 - 0.7 K/uL   Basophils Absolute 0.0 0.0 - 0.1 K/uL   RBC Morphology RARE NRBCs  Comment: POLYCHROMASIA PRESENT   WBC Morphology MILD LEFT SHIFT (1-5% METAS, OCC MYELO, OCC BANDS)   Magnesium     Status: None   Collection Time: 07/11/16  4:00 AM  Result Value Ref Range   Magnesium 2.0 1.7 - 2.4 mg/dL  APTT     Status: None   Collection Time: 07/11/16  4:00 AM  Result Value Ref Range   aPTT 30 24 - 36 seconds  Protime-INR     Status: Abnormal   Collection Time: 07/11/16  4:00 AM  Result Value Ref Range   Prothrombin Time 16.5 (H) 11.4 - 15.2 seconds   INR 1.32   Renal function panel     Status: Abnormal   Collection Time: 07/11/16  4:00 AM  Result Value Ref Range   Sodium 138 135 - 145 mmol/L   Potassium 3.9 3.5 - 5.1 mmol/L   Chloride 102 101 - 111 mmol/L   CO2 22 22 - 32 mmol/L   Glucose, Bld 219 (H) 65 - 99 mg/dL   BUN 106 (H) 6 - 20 mg/dL   Creatinine, Ser 4.38 (H) 0.44 - 1.00 mg/dL   Calcium 8.6 (L) 8.9 - 10.3 mg/dL   Phosphorus 7.5 (H) 2.5 - 4.6 mg/dL   Albumin 1.5 (L) 3.5 - 5.0 g/dL   GFR calc non Af Amer 10 (L) >60 mL/min   GFR calc Af Amer 11 (L) >60 mL/min    Comment: (NOTE) The eGFR has been calculated using the CKD EPI equation. This calculation has not been validated in all clinical situations. eGFR's persistently <60 mL/min signify  possible Chronic Kidney Disease.    Anion gap 14 5 - 15  Blood gas, arterial     Status: Abnormal   Collection Time: 07/11/16  8:17 AM  Result Value Ref Range   FIO2 40.00    Delivery systems VENTILATOR    Mode PRESSURE REGULATED VOLUME CONTROL    VT 470 mL   LHR 15 resp/min   Peep/cpap 5.0 cm H20   pH, Arterial 7.358 7.350 - 7.450   pCO2 arterial 36.5 32.0 - 48.0 mmHg   pO2, Arterial 119 (H) 83.0 - 108.0 mmHg   Bicarbonate 20.0 20.0 - 28.0 mmol/L   Acid-base deficit 4.5 (H) 0.0 - 2.0 mmol/L   O2 Saturation 97.8 %   Patient temperature 98.6    Collection site LEFT RADIAL    Drawn by [phone]    Sample type ARTERIAL DRAW    Allens test (pass/fail) PASS PASS  Glucose, capillary     Status: Abnormal   Collection Time: 07/11/16  8:47 AM  Result Value Ref Range   Glucose-Capillary 180 (H) 65 - 99 mg/dL    Imaging: Dg Chest Port 1 View  Addendum Date: 07/10/2016   ADDENDUM REPORT: 07/10/2016 14:19 ADDENDUM: Study discussed by telephone with Dr. Brand Males on 07/10/2016 at 1410 hours. Electronically Signed   By: Genevie Ann M.D.   On: 07/10/2016 14:19   Result Date: 07/10/2016 CLINICAL DATA:  65 year old female with respiratory failure. Intubated. Initial encounter. EXAM: PORTABLE CHEST 1 VIEW COMPARISON:  07/07/2016 and earlier. FINDINGS: Portable AP semi upright view at 1330 hours today. Right mainstem intubation now. The ET tube should be pulled back 4 cm to allow for placement above the carina and below the clavicles. Stable bilateral IJ approach catheters, that from the right courses into the left innominate vein as before. Stable visible enteric tube. Continued dense opacification in the left mid and lower lung with some  air bronchograms. Stable cardiac size and mediastinal contours. No pneumothorax, pulmonary edema, or right pleural effusion. IMPRESSION: 1. Right mainstem bronchus intubation.  Retract the ET Tube 4 cm. 2. Right lower lobe collapse or consolidation. 3. Otherwise  stable lines and tubes. Note that the right IJ approach catheter still terminates in the left innominate vein. Electronically Signed: By: Genevie Ann M.D. On: 07/10/2016 14:04   Dg Abd Portable 1v  Result Date: 07/10/2016 CLINICAL DATA:  Feeding tube insertion EXAM: PORTABLE ABDOMEN - 1 VIEW COMPARISON:  Portable exam 1128 hours compared to 06/24/2016 FINDINGS: Tip of feeding tube projects over ligament of Treitz. Decreased small bowel distention. No bowel dilatation or definite wall thickening. 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 LEFT lower lobe atelectasis versus consolidation. IMPRESSION: Tip of feeding tube projects over ligament of Treitz. Electronically Signed   By: Lavonia Dana M.D.   On: 07/10/2016 11:36    Assessment/Plan 1. Acute on chronic renal failure -we will plan to place a perm cath when the patient is able.  She is on HD right now.  If we can proceed later today with scheduling, we will.  If she is getting a trach tomorrow, then we will work around that procedure.  I have discussed the timing issues with her daughter and she understands.  She has access so getting her permament access is not emergent.  She understands this as well. -prophylactic heparin held today incase we can proceed with procedure.  Remain NPO Risks and Benefits discussed with the patient including, but not limited to bleeding, infection, vascular injury, pneumothorax which may require chest tube placement, air embolism or even death All of the patient's questions were answered, patient is agreeable to proceed. Consent signed and in chart.   Thank you for this interesting consult.  I greatly enjoyed meeting Kaedyn Belardo and look forward to participating in their care.  A copy of this report was sent to the requesting provider on this date.  Electronically Signed: Henreitta Cea 07/11/2016, 10:23 AM   I spent a total of 20 Minutes    in face to face  in clinical consultation, greater than 50% of which was counseling/coordinating care for acute on chronic renal failure

## 2016-07-11 NOTE — Consult Note (Signed)
Reason for Consult: Tracheostomy Referring Physician: [place]  Angelica Beck is an 65 y.o. female.  HPI: 65 year old female with multiple medical problems found unresponsive in late September and was brought to the ER where she was electively intubated.  She has been treated supportively and gradually weaned from the ventilator.  She was able to be extubated on 10/14 but required reintubation yesterday due to increasing work of breathing.  Tracheostomy is requested.  She has a long-standing thyroid goiter.  Past Medical History:  Diagnosis Date  . Arthritis   . Diabetes mellitus, type 2 ([place])   . Hyperlipidemia   . Hypertension   . Hyperthyroidism   . Obesity hypoventilation syndrome ([place]) 06/20/2013  . Stroke [facility])     Past Surgical History:  Procedure Laterality Date  . VENTRAL HERNIA REPAIR      Family History  Problem Relation Age of Onset  . Diabetes Mother     Social History:  reports that she quit smoking about 13 years ago. Her smoking use included Cigarettes. She has a 7.50 pack-year smoking history. She has never used smokeless tobacco. She reports that she does not drink alcohol or use drugs.  Allergies:  Allergies  Allergen Reactions  . Lactose Intolerance (Gi) Diarrhea and Nausea And Vomiting  . Penicillins Swelling    Facial swelling    Medications: I have reviewed the patient's current medications.  Results for orders placed or performed during the hospital encounter of 06/20/16 (from the past 48 hour(s))  Glucose, capillary     Status: Abnormal   Collection Time: 07/09/16  7:55 PM  Result Value Ref Range   Glucose-Capillary 129 (H) 65 - 99 mg/dL   Comment 1 Notify RN   Glucose, capillary     Status: Abnormal   Collection Time: 07/09/16 11:50 PM  Result Value Ref Range   Glucose-Capillary 123 (H) 65 - 99 mg/dL  Glucose, capillary     Status: Abnormal   Collection Time: 07/10/16  3:59 AM  Result Value Ref Range   Glucose-Capillary 119 (H) 65 -  99 mg/dL   Comment 1 Notify RN   CBC with Differential/Platelet     Status: Abnormal   Collection Time: 07/10/16  5:40 AM  Result Value Ref Range   WBC 13.7 (H) 4.0 - 10.5 K/uL   RBC 2.93 (L) 3.87 - 5.11 MIL/uL   Hemoglobin 8.3 (L) 12.0 - 15.0 g/dL    Comment: CONSISTENT WITH PREVIOUS RESULT   HCT 25.5 (L) 36.0 - 46.0 %   MCV 87.0 78.0 - 100.0 fL   MCH 28.3 26.0 - 34.0 pg   MCHC 32.5 30.0 - 36.0 g/dL   RDW 15.7 (H) 11.5 - 15.5 %   Platelets 366 150 - 400 K/uL    Comment: CONSISTENT WITH PREVIOUS RESULT   Neutrophils Relative % 81 %   Lymphocytes Relative 11 %   Monocytes Relative 6 %   Eosinophils Relative 1 %   Basophils Relative 1 %   Neutro Abs 11.2 (H) 1.7 - 7.7 K/uL   Lymphs Abs 1.5 0.7 - 4.0 K/uL   Monocytes Absolute 0.8 0.1 - 1.0 K/uL   Eosinophils Absolute 0.1 0.0 - 0.7 K/uL   Basophils Absolute 0.1 0.0 - 0.1 K/uL   WBC Morphology MILD LEFT SHIFT (1-5% METAS, OCC MYELO, OCC BANDS)   Magnesium     Status: None   Collection Time: 07/10/16  5:40 AM  Result Value Ref Range   Magnesium 1.9 1.7 -  2.4 mg/dL  Renal function panel     Status: Abnormal   Collection Time: 07/10/16  5:40 AM  Result Value Ref Range   Sodium 135 135 - 145 mmol/L   Potassium 4.1 3.5 - 5.1 mmol/L   Chloride 99 (L) 101 - 111 mmol/L   CO2 21 (L) 22 - 32 mmol/L   Glucose, Bld 138 (H) 65 - 99 mg/dL   BUN 82 (H) 6 - 20 mg/dL   Creatinine, Ser 3.72 (H) 0.44 - 1.00 mg/dL   Calcium 9.1 8.9 - 10.3 mg/dL   Phosphorus 6.8 (H) 2.5 - 4.6 mg/dL   Albumin 1.5 (L) 3.5 - 5.0 g/dL   GFR calc non Af Amer 12 (L) >60 mL/min   GFR calc Af Amer 14 (L) >60 mL/min    Comment: (NOTE) The eGFR has been calculated using the CKD EPI equation. This calculation has not been validated in all clinical situations. eGFR's persistently <60 mL/min signify possible Chronic Kidney Disease.    Anion gap 15 5 - 15  Glucose, capillary     Status: Abnormal   Collection Time: 07/10/16  7:43 AM  Result Value Ref Range    Glucose-Capillary 133 (H) 65 - 99 mg/dL   Comment 1 Notify RN   Glucose, capillary     Status: Abnormal   Collection Time: 07/10/16 11:48 AM  Result Value Ref Range   Glucose-Capillary 130 (H) 65 - 99 mg/dL   Comment 1 Notify RN   Blood gas, arterial     Status: Abnormal   Collection Time: 07/10/16 12:29 PM  Result Value Ref Range   FIO2 0.40    Delivery systems BILEVEL POSITIVE AIRWAY PRESSURE    Mode BILEVEL POSITIVE AIRWAY PRESSURE    LHR 15.0 resp/min   Peep/cpap 5.0 cm H20   Inspiratory PAP 7.0    pH, Arterial 7.324 (L) 7.350 - 7.450   pCO2 arterial 38.6 32.0 - 48.0 mmHg   pO2, Arterial 121 (H) 83.0 - 108.0 mmHg   Bicarbonate 19.5 (L) 20.0 - 28.0 mmol/L   Acid-base deficit 5.5 (H) 0.0 - 2.0 mmol/L   O2 Saturation 98.0 %   Patient temperature 98.6    Collection site LEFT RADIAL    Drawn by [id number]    Sample type ARTERIAL DRAW    Allens test (pass/fail) PASS PASS  Protime-INR     Status: None   Collection Time: 07/10/16  1:00 PM  Result Value Ref Range   Prothrombin Time 14.9 11.4 - 15.2 seconds   INR 1.16   I-STAT 3, arterial blood gas (G3+)     Status: Abnormal   Collection Time: 07/10/16  2:32 PM  Result Value Ref Range   pH, Arterial 7.391 7.350 - 7.450   pCO2 arterial 33.4 32.0 - 48.0 mmHg   pO2, Arterial 240.0 (H) 83.0 - 108.0 mmHg   Bicarbonate 20.5 20.0 - 28.0 mmol/L   TCO2 22 0 - 100 mmol/L   O2 Saturation 100.0 %   Acid-base deficit 4.0 (H) 0.0 - 2.0 mmol/L   Patient temperature 97.2 F    Collection site RADIAL, ALLEN'S TEST ACCEPTABLE    Sample type ARTERIAL   Glucose, capillary     Status: Abnormal   Collection Time: 07/10/16  3:53 PM  Result Value Ref Range   Glucose-Capillary 171 (H) 65 - 99 mg/dL   Comment 1 Notify RN   Glucose, capillary     Status: Abnormal   Collection Time: 07/10/16  7:53 PM  Result  Value Ref Range   Glucose-Capillary 219 (H) 65 - 99 mg/dL   Comment 1 Notify RN    Comment 2 Document in Chart   Glucose, capillary     Status:  Abnormal   Collection Time: 07/11/16 12:04 AM  Result Value Ref Range   Glucose-Capillary 205 (H) 65 - 99 mg/dL   Comment 1 Notify RN    Comment 2 Document in Chart   Glucose, capillary     Status: Abnormal   Collection Time: 07/11/16  3:57 AM  Result Value Ref Range   Glucose-Capillary 216 (H) 65 - 99 mg/dL   Comment 1 Notify RN    Comment 2 Document in Chart   CBC with Differential/Platelet     Status: Abnormal   Collection Time: 07/11/16  4:00 AM  Result Value Ref Range   WBC 15.8 (H) 4.0 - 10.5 K/uL   RBC 2.80 (L) 3.87 - 5.11 MIL/uL   Hemoglobin 7.8 (L) 12.0 - 15.0 g/dL   HCT 24.1 (L) 36.0 - 46.0 %   MCV 86.1 78.0 - 100.0 fL   MCH 27.9 26.0 - 34.0 pg   MCHC 32.4 30.0 - 36.0 g/dL   RDW 15.8 (H) 11.5 - 15.5 %   Platelets 378 150 - 400 K/uL   Neutrophils Relative % 88 %   Lymphocytes Relative 7 %   Monocytes Relative 4 %   Eosinophils Relative 1 %   Basophils Relative 0 %   Neutro Abs 13.9 (H) 1.7 - 7.7 K/uL   Lymphs Abs 1.1 0.7 - 4.0 K/uL   Monocytes Absolute 0.6 0.1 - 1.0 K/uL   Eosinophils Absolute 0.2 0.0 - 0.7 K/uL   Basophils Absolute 0.0 0.0 - 0.1 K/uL   RBC Morphology RARE NRBCs     Comment: POLYCHROMASIA PRESENT   WBC Morphology MILD LEFT SHIFT (1-5% METAS, OCC MYELO, OCC BANDS)   Magnesium     Status: None   Collection Time: 07/11/16  4:00 AM  Result Value Ref Range   Magnesium 2.0 1.7 - 2.4 mg/dL  APTT     Status: None   Collection Time: 07/11/16  4:00 AM  Result Value Ref Range   aPTT 30 24 - 36 seconds  Protime-INR     Status: Abnormal   Collection Time: 07/11/16  4:00 AM  Result Value Ref Range   Prothrombin Time 16.5 (H) 11.4 - 15.2 seconds   INR 1.32   Renal function panel     Status: Abnormal   Collection Time: 07/11/16  4:00 AM  Result Value Ref Range   Sodium 138 135 - 145 mmol/L   Potassium 3.9 3.5 - 5.1 mmol/L   Chloride 102 101 - 111 mmol/L   CO2 22 22 - 32 mmol/L   Glucose, Bld 219 (H) 65 - 99 mg/dL   BUN 106 (H) 6 - 20 mg/dL    Creatinine, Ser 4.38 (H) 0.44 - 1.00 mg/dL   Calcium 8.6 (L) 8.9 - 10.3 mg/dL   Phosphorus 7.5 (H) 2.5 - 4.6 mg/dL   Albumin 1.5 (L) 3.5 - 5.0 g/dL   GFR calc non Af Amer 10 (L) >60 mL/min   GFR calc Af Amer 11 (L) >60 mL/min    Comment: (NOTE) The eGFR has been calculated using the CKD EPI equation. This calculation has not been validated in all clinical situations. eGFR's persistently <60 mL/min signify possible Chronic Kidney Disease.    Anion gap 14 5 - 15  Blood gas, arterial  Status: Abnormal   Collection Time: 07/11/16  8:17 AM  Result Value Ref Range   FIO2 40.00    Delivery systems VENTILATOR    Mode PRESSURE REGULATED VOLUME CONTROL    VT 470 mL   LHR 15 resp/min   Peep/cpap 5.0 cm H20   pH, Arterial 7.358 7.350 - 7.450   pCO2 arterial 36.5 32.0 - 48.0 mmHg   pO2, Arterial 119 (H) 83.0 - 108.0 mmHg   Bicarbonate 20.0 20.0 - 28.0 mmol/L   Acid-base deficit 4.5 (H) 0.0 - 2.0 mmol/L   O2 Saturation 97.8 %   Patient temperature 98.6    Collection site LEFT RADIAL    Drawn by [phone]    Sample type ARTERIAL DRAW    Allens test (pass/fail) PASS PASS  Glucose, capillary     Status: Abnormal   Collection Time: 07/11/16  8:47 AM  Result Value Ref Range   Glucose-Capillary 180 (H) 65 - 99 mg/dL  CBC     Status: Abnormal   Collection Time: 07/11/16  9:30 AM  Result Value Ref Range   WBC 22.2 (H) 4.0 - 10.5 K/uL   RBC 3.14 (L) 3.87 - 5.11 MIL/uL   Hemoglobin 9.0 (L) 12.0 - 15.0 g/dL   HCT 27.1 (L) 36.0 - 46.0 %   MCV 86.3 78.0 - 100.0 fL   MCH 28.7 26.0 - 34.0 pg   MCHC 33.2 30.0 - 36.0 g/dL   RDW 15.9 (H) 11.5 - 15.5 %   Platelets 459 (H) 150 - 400 K/uL  Glucose, capillary     Status: Abnormal   Collection Time: 07/11/16 11:51 AM  Result Value Ref Range   Glucose-Capillary 162 (H) 65 - 99 mg/dL   Comment 1 Document in Chart   Glucose, capillary     Status: Abnormal   Collection Time: 07/11/16  3:37 PM  Result Value Ref Range   Glucose-Capillary 136 (H) 65 - 99  mg/dL   Comment 1 Document in Chart     Dg Chest Port 1 View  Addendum Date: 07/10/2016   ADDENDUM REPORT: 07/10/2016 14:19 ADDENDUM: Study discussed by telephone with Dr. Brand Males on 07/10/2016 at 1410 hours. Electronically Signed   By: Genevie Ann M.D.   On: 07/10/2016 14:19   Result Date: 07/10/2016 CLINICAL DATA:  65 year old female with respiratory failure. Intubated. Initial encounter. EXAM: PORTABLE CHEST 1 VIEW COMPARISON:  07/07/2016 and earlier. FINDINGS: Portable AP semi upright view at 1330 hours today. Right mainstem intubation now. The ET tube should be pulled back 4 cm to allow for placement above the carina and below the clavicles. Stable bilateral IJ approach catheters, that from the right courses into the left innominate vein as before. Stable visible enteric tube. Continued dense opacification in the left mid and lower lung with some air bronchograms. Stable cardiac size and mediastinal contours. No pneumothorax, pulmonary edema, or right pleural effusion. IMPRESSION: 1. Right mainstem bronchus intubation.  Retract the ET Tube 4 cm. 2. Right lower lobe collapse or consolidation. 3. Otherwise stable lines and tubes. Note that the right IJ approach catheter still terminates in the left innominate vein. Electronically Signed: By: Genevie Ann M.D. On: 07/10/2016 14:04   Dg Abd Portable 1v  Result Date: 07/10/2016 CLINICAL DATA:  Feeding tube insertion EXAM: PORTABLE ABDOMEN - 1 VIEW COMPARISON:  Portable exam 1128 hours compared to 06/24/2016 FINDINGS: Tip of feeding tube projects over ligament of Treitz. Decreased small bowel distention. No bowel dilatation or definite wall thickening. 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 LEFT lower lobe atelectasis versus consolidation. IMPRESSION: Tip of feeding tube projects over ligament of Treitz. Electronically Signed   By: Lavonia Dana M.D.   On: 07/10/2016 11:36    Review of Systems   Unable to perform ROS: Intubated   Blood pressure 137/66, pulse (!) 115, temperature 99 F (37.2 C), temperature source Oral, resp. rate (!) 27, height '5\' 6"'  (1.676 m), weight 105.5 kg (232 lb 9.4 oz), SpO2 100 %. Physical Exam  Constitutional: She appears well-developed and well-nourished. No distress.  HENT:  Head: Normocephalic and atraumatic.  Right Ear: External ear normal.  Left Ear: External ear normal.  Nose: Nose normal.  Orotracheally intubated.  Eyes: Conjunctivae and EOM are normal. Pupils are equal, round, and reactive to light.  Neck:  Large thyroid goiter.  Cardiovascular: Normal rate.   Respiratory:  Mechanical ventilation  Neurological: She is alert. No cranial nerve deficit.  Interactive.  Skin: Skin is warm and dry.  Psychiatric:  Unable to assess.    Assessment/Plan: Respiratory failure, thyroid goiter Tracheostomy is scheduled for Friday.  This will likely require removal of the thyroid isthmus.  The situation and plan were discussed with her daughter including risks, benefits, and alternatives.  She expressed understanding and agreement.  Telephone consent was obtained.  Ancel Easler 07/11/2016, 4:49 PM

## 2016-07-11 NOTE — Care Management Note (Signed)
Case Management Note  Patient Details  Name: Hartleigh Edmonston MRN: [id number] Date of Birth: July 19, 1951  Subjective/Objective:   Pt admitted with  - pt is now on ventilator   Action/Plan:   She lives at home with her daughter where she requires full assistance, she is unable to truly ambulate and spends most of her time in a hospital bed/recliner. She relies on family members almost entirely for activities of daily living.  Pt may benefit from PT/OT eval for possible placement if family is unable to resume care at home.  CM will continue to follow pt for discharge needs   Expected Discharge Date:                  Expected Discharge Plan:  Long Term Acute Care (LTAC)  In-House Referral:  NA  Discharge planning Services  CM Consult  Post Acute Care Choice:    Choice offered to:     DME Arranged:    DME Agency:     HH Arranged:    HH Agency:     Status of Service:  In process, will continue to follow  If discussed at Long Length of Stay Meetings, dates discussed:    Additional Comments: 07/11/2016  Pt reintubated yesterday.  Plan is to have trach in OR today and then perm catheter placed this week prior to discharge to [facility] per attending   07/06/16  Discussed pt in LOS 07/06/16:  Pt remains appropriate for continued stay.  Discussed pt with attending ; pt is not ready for discharge to [facility] at this time.  Plan is for possible extubation today post IHD - if pt fails extubation pt will receive trach - daughter is aware  07/05/16 Pts daughter met/toured both facilities yesterday.  CM contacted daughter this am - collective decision has been made to discharge to [facility] when medically ready.  Both facilities have been made aware of choice.  Pt remains on ventilator - with slow improvement of mental status  07/04/16 CM covering unit 10/9 - spoke in detail with Physician Advisor and it was determined that pt is appropriate for LTACH.  CM spoke with attending in detail today and he  also agrees with [facility] referral - attending has already been in discussions with pt and daughter regarding post acute facility with current needs (vent/trach and HD).  Referral given to both LTACHs 07/03/16 - both [facility] and [facility] have offered beds.  CM contacted daughter [name] and informed her or the discharge recommendation and choice between [facility] and [facility]- daughter is in agreement with possible discharge to [facility] and informal meeting with both options today ([facility] at [employer] and [facility] at [employer]) at pts bedside.  Pt is currently intubated with plans for tentative extubation/trach - still requiring IHD, needing perm HD cath placed however WBC trending up and concern for C Diff.   CM will continue to monitor for discharge needs Maryclare Labrador, RN 07/11/2016, 2:23 PM

## 2016-07-12 ENCOUNTER — Encounter (HOSPITAL_COMMUNITY): Payer: Self-pay | Admitting: Interventional Radiology

## 2016-07-12 ENCOUNTER — Inpatient Hospital Stay (HOSPITAL_COMMUNITY): Payer: Medicare Other

## 2016-07-12 DIAGNOSIS — G9340 Encephalopathy, unspecified: Secondary | ICD-10-CM

## 2016-07-12 HISTORY — PX: IR GENERIC HISTORICAL: IMG1180011

## 2016-07-12 LAB — COMPREHENSIVE METABOLIC PANEL
ALBUMIN: 1.5 g/dL — AB (ref 3.5–5.0)
ALK PHOS: 228 U/L — AB (ref 38–126)
ALT: 50 U/L (ref 14–54)
AST: 51 U/L — AB (ref 15–41)
Anion gap: 11 (ref 5–15)
BUN: 58 mg/dL — AB (ref 6–20)
CHLORIDE: 99 mmol/L — AB (ref 101–111)
CO2: 25 mmol/L (ref 22–32)
CREATININE: 3.32 mg/dL — AB (ref 0.44–1.00)
Calcium: 8.6 mg/dL — ABNORMAL LOW (ref 8.9–10.3)
GFR calc Af Amer: 16 mL/min — ABNORMAL LOW (ref >60)
GFR calc non Af Amer: 14 mL/min — ABNORMAL LOW (ref >60)
GLUCOSE: 92 mg/dL (ref 65–99)
Potassium: 3 mmol/L — ABNORMAL LOW (ref 3.5–5.1)
Sodium: 135 mmol/L (ref 135–145)
Total Bilirubin: 0.3 mg/dL (ref 0.3–1.2)
Total Protein: 5.7 g/dL — ABNORMAL LOW (ref 6.5–8.1)

## 2016-07-12 LAB — GLUCOSE, CAPILLARY
GLUCOSE-CAPILLARY: 90 mg/dL (ref 65–99)
GLUCOSE-CAPILLARY: 85 mg/dL (ref 65–99)
GLUCOSE-CAPILLARY: 110 mg/dL — AB (ref 65–99)
Glucose-Capillary: 79 mg/dL (ref 65–99)
Glucose-Capillary: 100 mg/dL — ABNORMAL HIGH (ref 65–99)
Glucose-Capillary: 132 mg/dL — ABNORMAL HIGH (ref 65–99)

## 2016-07-12 LAB — CBC WITH DIFFERENTIAL/PLATELET
BASOS ABS: 0 10*3/uL (ref 0.0–0.1)
Basophils Relative: 0 %
EOS ABS: 0.2 10*3/uL (ref 0.0–0.7)
Eosinophils Relative: 1 %
HEMATOCRIT: 23.7 % — AB (ref 36.0–46.0)
HEMOGLOBIN: 7.8 g/dL — AB (ref 12.0–15.0)
LYMPHS PCT: 6 %
Lymphs Abs: 1.2 10*3/uL (ref 0.7–4.0)
MCH: 28.3 pg (ref 26.0–34.0)
MCHC: 32.9 g/dL (ref 30.0–36.0)
MCV: 85.9 fL (ref 78.0–100.0)
Monocytes Absolute: 0.8 10*3/uL (ref 0.1–1.0)
Monocytes Relative: 4 %
Neutro Abs: 17.9 10*3/uL — ABNORMAL HIGH (ref 1.7–7.7)
Neutrophils Relative %: 89 %
Platelets: 373 10*3/uL (ref 150–400)
RBC: 2.76 MIL/uL — ABNORMAL LOW (ref 3.87–5.11)
RDW: 15.9 % — ABNORMAL HIGH (ref 11.5–15.5)
WBC: 20.1 10*3/uL — ABNORMAL HIGH (ref 4.0–10.5)

## 2016-07-12 LAB — TSH: TSH: 0.286 u[IU]/mL — ABNORMAL LOW (ref 0.350–4.500)

## 2016-07-12 LAB — MAGNESIUM: MAGNESIUM: 1.8 mg/dL (ref 1.7–2.4)

## 2016-07-12 LAB — PHOSPHORUS: Phosphorus: 4.6 mg/dL (ref 2.5–4.6)

## 2016-07-12 MED ORDER — FLUMAZENIL 0.5 MG/5ML IV SOLN
INTRAVENOUS | Status: AC
  Filled 2016-07-12: qty 5

## 2016-07-12 MED ORDER — CHLORHEXIDINE GLUCONATE 4 % EX LIQD
CUTANEOUS | Status: AC
  Administered 2016-07-12: 10:00:00 via TOPICAL
  Filled 2016-07-12: qty 15

## 2016-07-12 MED ORDER — HEPARIN SODIUM (PORCINE) 1000 UNIT/ML IJ SOLN
INTRAMUSCULAR | Status: AC
  Administered 2016-07-12: 3.2 [IU]
  Filled 2016-07-12: qty 1

## 2016-07-12 MED ORDER — LIDOCAINE HCL 1 % IJ SOLN
INTRAMUSCULAR | Status: AC | PRN
  Administered 2016-07-12: 20 mL

## 2016-07-12 MED ORDER — VANCOMYCIN HCL IN DEXTROSE 1-5 GM/200ML-% IV SOLN
INTRAVENOUS | Status: AC
  Filled 2016-07-12: qty 200

## 2016-07-12 MED ORDER — VANCOMYCIN HCL IN DEXTROSE 1-5 GM/200ML-% IV SOLN
1000.0000 mg | Freq: Once | INTRAVENOUS | Status: AC
  Administered 2016-07-12: 1000 mg via INTRAVENOUS
  Filled 2016-07-12: qty 200

## 2016-07-12 MED ORDER — LIDOCAINE HCL 1 % IJ SOLN
INTRAMUSCULAR | Status: AC
  Filled 2016-07-12: qty 20

## 2016-07-12 MED ORDER — MIDAZOLAM HCL 2 MG/2ML IJ SOLN
INTRAMUSCULAR | Status: AC | PRN
  Administered 2016-07-12 (×2): 1 mg via INTRAVENOUS

## 2016-07-12 MED ORDER — FENTANYL CITRATE (PF) 100 MCG/2ML IJ SOLN
INTRAMUSCULAR | Status: AC
  Filled 2016-07-12: qty 4

## 2016-07-12 MED ORDER — POTASSIUM CHLORIDE 20 MEQ/15ML (10%) PO SOLN
60.0000 meq | Freq: Every day | ORAL | Status: DC
  Administered 2016-07-12: 60 meq
  Filled 2016-07-12 (×2): qty 45

## 2016-07-12 MED ORDER — FENTANYL CITRATE (PF) 100 MCG/2ML IJ SOLN
INTRAMUSCULAR | Status: AC | PRN
  Administered 2016-07-12 (×2): 50 ug via INTRAVENOUS

## 2016-07-12 MED ORDER — NALOXONE HCL 0.4 MG/ML IJ SOLN
INTRAMUSCULAR | Status: AC
  Filled 2016-07-12: qty 1

## 2016-07-12 MED ORDER — POTASSIUM CITRATE-CITRIC ACID 1100-334 MG/5ML PO SOLN
20.0000 meq | Freq: Three times a day (TID) | ORAL | Status: DC
  Filled 2016-07-12: qty 10

## 2016-07-12 MED ORDER — MIDAZOLAM HCL 2 MG/2ML IJ SOLN
INTRAMUSCULAR | Status: AC
  Filled 2016-07-12: qty 4

## 2016-07-12 NOTE — Progress Notes (Signed)
Spoke with patient's daughter [name] to let her know placement of HD cath went well. She is asking about placement of G-tube along with trach placement this Friday. Will investigate this vs continued NG feeds.  Olene Floss, MD [place], PGY-2

## 2016-07-12 NOTE — Progress Notes (Signed)
[facility] ROUNDING NOTE   Subjective:   Interval History:  No changes this morning appears relatively stable at this point  Still intubated  Objective:  Vital signs in last 24 hours:  Temp:  [98 F (36.7 C)-99.4 F (37.4 C)] 99.2 F (37.3 C) (10/18 0749) Pulse Rate:  [89-119] 94 (10/18 1005) Resp:  [14-28] 15 (10/18 1005) BP: (77-150)/(50-80) 133/71 (10/18 1005) SpO2:  [96 %-100 %] 100 % (10/18 1005) FiO2 (%):  [40 %] 40 % (10/18 0800) Weight:  [102.4 kg (225 lb 12 oz)-105.5 kg (232 lb 9.4 oz)] 102.4 kg (225 lb 12 oz) (10/18 0500)  Weight change: 3 kg (6 lb 9.8 oz) Filed Weights   07/11/16 0846 07/11/16 1246 07/12/16 0500  Weight: 107.5 kg (236 lb 15.9 oz) 105.5 kg (232 lb 9.4 oz) 102.4 kg (225 lb 12 oz)    Intake/Output: I/O last 3 completed shifts: In: 962 [I.V.:250; Other:110; NG/GT:595] Out: 2001 [Other:2000; Stool:1]   Intake/Output this shift:  Total I/O In: 10 [I.V.:10] Out: -   General:  Obese female. ETT in place. No family at bedside. Neuro: Nods appropriately. Tracks to voice. Following commands. HEENT:  No scleral icterus.  ETT and NGT  Cardiovascular:  Regular rhythm. Ansarca persists but is improving. Right HD catheter Pulmonary:  Rhoncherous sounds Abdomen:  Soft. Protuberant. Normoactive bowel sounds. Musculoskeletal:  No joint deformity or effusion. Lifting head off of bed. Skin:  Warm & dry. No rash on exposed skin.   Basic Metabolic Panel:  Recent Labs Lab 07/08/16 0458 07/09/16 0500 07/10/16 0540 07/11/16 0400 07/12/16 0506  NA 132* 135 135 138 135  K 3.8 3.9 4.1 3.9 3.0*  CL 98* 100* 99* 102 99*  CO2 22 24 21* 22 25  GLUCOSE 263* 125* 138* 219* 92  BUN 101* 63* 82* 106* 58*  CREATININE 3.64* 2.82* 3.72* 4.38* 3.32*  CALCIUM 8.8* 8.8* 9.1 8.6* 8.6*  MG 2.0 1.8 1.9 2.0 1.8  PHOS 4.6 4.3 6.8* 7.5* 4.6    Liver Function Tests:  Recent Labs Lab 07/07/16 0436 07/08/16 0458 07/09/16 0500 07/10/16 0540  07/11/16 0400 07/12/16 0506  AST 93*  --   --   --   --  51*  ALT 71*  --   --   --   --  50  ALKPHOS 304*  --   --   --   --  228*  BILITOT 0.5  --   --   --   --  0.3  PROT 5.5*  --   --   --   --  5.7*  ALBUMIN 1.4* 1.3* 1.4* 1.5* 1.5* 1.5*   No results for input(s): LIPASE, AMYLASE in the last 168 hours. No results for input(s): AMMONIA in the last 168 hours.  CBC:  Recent Labs Lab 07/08/16 0458 07/09/16 0500 07/10/16 0540 07/11/16 0400 07/11/16 0930 07/12/16 0506  WBC 11.3* 11.5* 13.7* 15.8* 22.2* 20.1*  NEUTROABS 9.3* 9.0* 11.2* 13.9*  --  17.9*  HGB 7.9* 8.0* 8.3* 7.8* 9.0* 7.8*  HCT 23.6* 24.4* 25.5* 24.1* 27.1* 23.7*  MCV 86.1 87.1 87.0 86.1 86.3 85.9  PLT 323 335 366 378 459* 373    Cardiac Enzymes: No results for input(s): CKTOTAL, CKMB, CKMBINDEX, TROPONINI in the last 168 hours.  BNP: Invalid input(s): POCBNP  CBG:  Recent Labs Lab 07/11/16 1537 07/11/16 1946 07/11/16 2349 07/12/16 0355 07/12/16 0747  GLUCAP 136* 134* 169* 90 17    Microbiology: Results for orders placed or  performed during the hospital encounter of 06/20/16  Blood Culture (routine x 2)     Status: None   Collection Time: 06/20/16 10:25 PM  Result Value Ref Range Status   Specimen Description BLOOD RIGHT HAND  Final   Special Requests IN PEDIATRIC BOTTLE 1ML  Final   Culture NO GROWTH 5 DAYS  Final   Report Status 06/25/2016 FINAL  Final  Blood Culture (routine x 2)     Status: Abnormal   Collection Time: 06/20/16 10:29 PM  Result Value Ref Range Status   Specimen Description BLOOD LEFT ARM  Final   Special Requests BOTTLES DRAWN AEROBIC AND ANAEROBIC 5ML  Final   Culture  Setup Time   Final    GRAM POSITIVE COCCI IN CLUSTERS IN BOTH AEROBIC AND ANAEROBIC BOTTLES CRITICAL RESULT CALLED TO, READ BACK BY AND VERIFIED WITH: T. DANG, [place] ON 06/21/16 BY C. JESSUP, MLT.    Culture (A)  Final    STAPHYLOCOCCUS SPECIES (COAGULASE NEGATIVE) THE SIGNIFICANCE OF  ISOLATING THIS ORGANISM FROM A SINGLE SET OF BLOOD CULTURES WHEN MULTIPLE SETS ARE DRAWN IS UNCERTAIN. PLEASE NOTIFY THE MICROBIOLOGY DEPARTMENT WITHIN ONE WEEK IF SPECIATION AND SENSITIVITIES ARE REQUIRED.    Report Status 06/23/2016 FINAL  Final  Urine culture     Status: Abnormal   Collection Time: 06/20/16 10:29 PM  Result Value Ref Range Status   Specimen Description IN/OUT CATH URINE  Final   Special Requests NONE  Final   Culture (A)  Final    5,000 COLONIES/mL ESCHERICHIA COLI 1,000 COLONIES/mL KLEBSIELLA PNEUMONIAE    Report Status 06/23/2016 FINAL  Final   Organism ID, Bacteria ESCHERICHIA COLI (A)  Final   Organism ID, Bacteria KLEBSIELLA PNEUMONIAE (A)  Final      Susceptibility   Escherichia coli - MIC*    AMPICILLIN >=32 RESISTANT Resistant     CEFAZOLIN <=4 SENSITIVE Sensitive     CEFTRIAXONE <=1 SENSITIVE Sensitive     CIPROFLOXACIN 1 SENSITIVE Sensitive     GENTAMICIN <=1 SENSITIVE Sensitive     IMIPENEM <=0.25 SENSITIVE Sensitive     NITROFURANTOIN <=16 SENSITIVE Sensitive     TRIMETH/SULFA >=320 RESISTANT Resistant     AMPICILLIN/SULBACTAM 16 INTERMEDIATE Intermediate     PIP/TAZO <=4 SENSITIVE Sensitive     Extended ESBL NEGATIVE Sensitive     * 5,000 COLONIES/mL ESCHERICHIA COLI   Klebsiella pneumoniae - MIC*    AMPICILLIN >=32 RESISTANT Resistant     CEFAZOLIN <=4 SENSITIVE Sensitive     CEFTRIAXONE <=1 SENSITIVE Sensitive     CIPROFLOXACIN <=0.25 SENSITIVE Sensitive     GENTAMICIN <=1 SENSITIVE Sensitive     IMIPENEM <=0.25 SENSITIVE Sensitive     NITROFURANTOIN <=16 SENSITIVE Sensitive     TRIMETH/SULFA <=20 SENSITIVE Sensitive     AMPICILLIN/SULBACTAM 4 SENSITIVE Sensitive     PIP/TAZO <=4 SENSITIVE Sensitive     Extended ESBL NEGATIVE Sensitive     * 1,000 COLONIES/mL KLEBSIELLA PNEUMONIAE  Gram stain     Status: None   Collection Time: 06/20/16 10:29 PM  Result Value Ref Range Status   Specimen Description IN/OUT CATH URINE  Final   Special  Requests NONE  Final   Gram Stain   Final    CYTOSPIN SMEAR SQUAMOUS EPITHELIAL CELLS PRESENT WBC PRESENT, PREDOMINANTLY MONONUCLEAR GRAM VARIABLE ROD    Report Status 06/20/2016 FINAL  Final  Blood Culture ID Panel (Reflexed)     Status: Abnormal   Collection Time: 06/20/16 10:29 PM  Result Value Ref Range Status   Enterococcus species NOT DETECTED NOT DETECTED Final   Listeria monocytogenes NOT DETECTED NOT DETECTED Final   Staphylococcus species DETECTED (A) NOT DETECTED Final    Comment: CRITICAL RESULT CALLED TO, READ BACK BY AND VERIFIED WITH: T. DANG, [place] ON 06/21/16 BY C. JESSUP, MLT.    Staphylococcus aureus NOT DETECTED NOT DETECTED Final   Methicillin resistance NOT DETECTED NOT DETECTED Final   Streptococcus species NOT DETECTED NOT DETECTED Final   Streptococcus agalactiae NOT DETECTED NOT DETECTED Final   Streptococcus pneumoniae NOT DETECTED NOT DETECTED Final   Streptococcus pyogenes NOT DETECTED NOT DETECTED Final   Acinetobacter baumannii NOT DETECTED NOT DETECTED Final   Enterobacteriaceae species NOT DETECTED NOT DETECTED Final   Enterobacter cloacae complex NOT DETECTED NOT DETECTED Final   Escherichia coli NOT DETECTED NOT DETECTED Final   Klebsiella oxytoca NOT DETECTED NOT DETECTED Final   Klebsiella pneumoniae NOT DETECTED NOT DETECTED Final   Proteus species NOT DETECTED NOT DETECTED Final   Serratia marcescens NOT DETECTED NOT DETECTED Final   Haemophilus influenzae NOT DETECTED NOT DETECTED Final   Neisseria meningitidis NOT DETECTED NOT DETECTED Final   Pseudomonas aeruginosa NOT DETECTED NOT DETECTED Final   Candida albicans NOT DETECTED NOT DETECTED Final   Candida glabrata NOT DETECTED NOT DETECTED Final   Candida krusei NOT DETECTED NOT DETECTED Final   Candida parapsilosis NOT DETECTED NOT DETECTED Final   Candida tropicalis NOT DETECTED NOT DETECTED Final  MRSA PCR Screening     Status: None   Collection Time: 06/21/16 12:53 AM   Result Value Ref Range Status   MRSA by PCR NEGATIVE NEGATIVE Final    Comment:        The GeneXpert MRSA Assay (FDA approved for NASAL specimens only), is one component of a comprehensive MRSA colonization surveillance program. It is not intended to diagnose MRSA infection nor to guide or monitor treatment for MRSA infections.   C difficile quick scan w PCR reflex     Status: Abnormal   Collection Time: 07/04/16 10:10 AM  Result Value Ref Range Status   C Diff antigen POSITIVE (A) NEGATIVE Final   C Diff toxin POSITIVE (A) NEGATIVE Final   C Diff interpretation Toxin producing C. difficile detected.  Final    Comment: CRITICAL RESULT CALLED TO, READ BACK BY AND VERIFIED WITH: L. Flood RN 11:45 07/04/16 ([email or web address])     Coagulation Studies:  Recent Labs  07/10/16 1300 07/11/16 0400  LABPROT 14.9 16.5*  INR 1.16 1.32    Urinalysis: No results for input(s): COLORURINE, LABSPEC, PHURINE, GLUCOSEU, HGBUR, BILIRUBINUR, KETONESUR, PROTEINUR, UROBILINOGEN, NITRITE, LEUKOCYTESUR in the last 72 hours.  Invalid input(s): APPERANCEUR    Imaging: Dg Chest Port 1 View  Result Date: 07/12/2016 CLINICAL DATA:  Shortness of breath.  Intubation. EXAM: PORTABLE CHEST 1 VIEW COMPARISON:  07/10/2016. FINDINGS: Endotracheal tube is been withdrawn slightly. Its tip is 1 cm above the carina. Proximal repositioning of 2 cm should be considered. Feeding tube tip below left hemidiaphragm. Left IJ line in stable position. Stable cardiomegaly. Persistent left lower lobe atelectasis and consolidation. Mild infiltrate left upper lobe cannot be excluded. Mild right base subsegmental atelectasis. Degenerative changes thoracic spine . IMPRESSION: 1. Endotracheal tube is been withdrawn, its tip is approximately 1 cm above the carina. Proximal repositioning of 2 cm should be considered. Feeding tube tip below left hemidiaphragm. Left IJ line in stable position. 2. Persistent dense atelectasis and  consolidation in  the left lower lobe. Mild infiltrate left upper lobe cannot be excluded. Mild right base subsegmental atelectasis . Electronically Signed   By: Marcello Moores  Register   On: 07/12/2016 07:13   Dg Chest Port 1 View  Addendum Date: 07/10/2016   ADDENDUM REPORT: 07/10/2016 14:19 ADDENDUM: Study discussed by telephone with Dr. Brand Males on 07/10/2016 at 1410 hours. Electronically Signed   By: Genevie Ann M.D.   On: 07/10/2016 14:19   Result Date: 07/10/2016 CLINICAL DATA:  65 year old female with respiratory failure. Intubated. Initial encounter. EXAM: PORTABLE CHEST 1 VIEW COMPARISON:  07/07/2016 and earlier. FINDINGS: Portable AP semi upright view at 1330 hours today. Right mainstem intubation now. The ET tube should be pulled back 4 cm to allow for placement above the carina and below the clavicles. Stable bilateral IJ approach catheters, that from the right courses into the left innominate vein as before. Stable visible enteric tube. Continued dense opacification in the left mid and lower lung with some air bronchograms. Stable cardiac size and mediastinal contours. No pneumothorax, pulmonary edema, or right pleural effusion. IMPRESSION: 1. Right mainstem bronchus intubation.  Retract the ET Tube 4 cm. 2. Right lower lobe collapse or consolidation. 3. Otherwise stable lines and tubes. Note that the right IJ approach catheter still terminates in the left innominate vein. Electronically Signed: By: Genevie Ann M.D. On: 07/10/2016 14:04   Dg Abd Portable 1v  Result Date: 07/10/2016 CLINICAL DATA:  Feeding tube insertion EXAM: PORTABLE ABDOMEN - 1 VIEW COMPARISON:  Portable exam 1128 hours compared to 06/24/2016 FINDINGS: Tip of feeding tube projects over ligament of Treitz. Decreased small bowel distention. No bowel dilatation or definite wall thickening. 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 LEFT lower lobe atelectasis versus consolidation.  IMPRESSION: Tip of feeding tube projects over ligament of Treitz. Electronically Signed   By: Lavonia Dana M.D.   On: 07/10/2016 11:36     Medications:     . aspirin  162 mg Per Tube Daily  . chlorhexidine gluconate (MEDLINE KIT)  15 mL Mouth Rinse BID  . darbepoetin (ARANESP) injection - DIALYSIS  100 mcg Intravenous Q Tue-HD  . famotidine  20 mg Per Tube Daily  . feeding supplement (NEPRO CARB STEADY)  1,000 mL Oral Q24H  . fentaNYL      . heparin subcutaneous  5,000 Units Subcutaneous Q8H  . insulin aspart  0-9 Units Subcutaneous Q4H  . insulin detemir  10 Units Subcutaneous BID  . lidocaine      . mouth rinse  15 mL Mouth Rinse QID  . methimazole  5 mg Per Tube Daily  . metoprolol tartrate  25 mg Per Tube BID  . midazolam      . multivitamin  15 mL Oral Daily  . potassium chloride  60 mEq Per Tube Daily  . vancomycin      . vancomycin  500 mg Oral Q6H   sodium chloride, Place/Maintain arterial line **AND** sodium chloride, sodium chloride, sodium chloride, alteplase, fentaNYL (SUBLIMAZE) injection, fentaNYL (SUBLIMAZE) injection, heparin, lidocaine (PF), lidocaine-prilocaine, pentafluoroprop-tetrafluoroeth  Assessment/ Plan:  Patient with history of CVA and debilitation and diabetes and chronic renal disease  Palliative care involved  CKD stage 3 baseline now with acute renal failure from ATN  Minimal urine output and continues to be dialysis dependent  1. Acute Renal Failure    Baseline creatinine 1.5    Secondary to ATN       Continue dialysis TTS schedule 2.  HTN  Appears hemodynamically stable and no pressors 3. Hypoxic renal Failure  Continues to be dependent on BIPAP  4. Anemia  Stable  Darbepoietin 100 mcg weekly     Hb 8.3 5. C Diff 10/10 started on Vancomycin and Flagyl     LOS: 31 Jahdiel Krol W _0 _1 :45 AM

## 2016-07-12 NOTE — Progress Notes (Signed)
PULMONARY / CRITICAL CARE MEDICINE   Name: Angelica Beck MRN: [id number] DOB: 1950-12-08    ADMISSION DATE:  06/20/2016 CONSULTATION DATE:  06/20/2016  REFERRING MD:  EDP Dr Cathleen Fears   CHIEF COMPLAINT:  AMS  HISTORY OF PRESENT ILLNESS:   65 year old female with past medical history as below, which is significant for stroke with subsequent debilitation, diabetes, chronic kidney disease, chronic urinary tract infections due to incontinence, for which she takes chronic antibiotics. She lives at home with her daughter where she requires full assistance she is unable to truly ambulate and spends most of her time in a hospital bed/recliner. She relies on family members almost entirely for activities of daily living. She was in her usual state of health until about 9/23 when she developed nausea and vomiting. This persisted for 2 days until symptoms subsided, however, she continued to complain of abdominal pain. 9/26 when the patient's daughter got home from work the patient was found to be unresponsive. EMS was called and she was found hypoglycemic which was corrected, however, altered mental status did not improve. Upon arrival to the emergency department she continued to be lethargic and was found to be hypotensive with hypoxemia. Laboratory evaluation significant for potassium 7.4, bicarbonate less than 7, creatinine 6.51, BUN 95, WBC 12.3, lactic acid 13.88, pH 7.07.  SUBJECTIVE:  Reintubated 10/16. Patient nods no to any chest pain or difficulty breathing at this time. Nods no to any nausea or belly pain.   REVIEW OF SYSTEMS:  Denies pain. Denies trouble breathing.   VITAL SIGNS: BP 136/62   Pulse 92   Temp 98 F (36.7 C) (Oral)   Resp 15   Ht '5\' 6"'$  (1.676 m)   Wt 225 lb 12 oz (102.4 kg)   SpO2 100%   BMI 36.44 kg/m   HEMODYNAMICS:    VENTILATOR SETTINGS: Vent Mode: PRVC FiO2 (%):  [40 %] 40 % Set Rate:  [15 bmp] 15 bmp Vt Set:  [470 mL] 470 mL PEEP:  [5 cmH20] 5  cmH20 Plateau Pressure:  [20 cmH20-26 cmH20] 24 cmH20  INTAKE / OUTPUT: I/O last 3 completed shifts: In: 945 [I.V.:240; Other:110; NG/GT:595] Out: 2001 [Other:2000; Stool:1]  PHYSICAL EXAMINATION: General:  Obese female. ETT in place. No family at bedside. Neuro: Nods appropriately. Tracks to voice. Following commands. HEENT:  No scleral icterus. Goiter noted. Cardiovascular:  Regular rhythm. Ansarca persists but is improving. Pulmonary: Normal work of breathing. Coarse upper airway sounds. Abdomen:  Soft. Protuberant. Normoactive bowel sounds. Musculoskeletal:  No joint deformity or effusion. Lifting head off of bed. Skin:  Warm & dry. No rash on exposed skin.  LABS:  BMET  Recent Labs Lab 07/10/16 0540 07/11/16 0400 07/12/16 0506  NA 135 138 135  K 4.1 3.9 3.0*  CL 99* 102 99*  CO2 21* 22 25  BUN 82* 106* 58*  CREATININE 3.72* 4.38* 3.32*  GLUCOSE 138* 219* 92    Electrolytes  Recent Labs Lab 07/10/16 0540 07/11/16 0400 07/12/16 0506  CALCIUM 9.1 8.6* 8.6*  MG 1.9 2.0 1.8  PHOS 6.8* 7.5* 4.6    CBC  Recent Labs Lab 07/11/16 0400 07/11/16 0930 07/12/16 0506  WBC 15.8* 22.2* 20.1*  HGB 7.8* 9.0* 7.8*  HCT 24.1* 27.1* 23.7*  PLT 378 459* 373    Coag's  Recent Labs Lab 07/10/16 1300 07/11/16 0400  APTT  --  30  INR 1.16 1.32    Sepsis Markers No results for input(s): LATICACIDVEN, PROCALCITON, O2SATVEN in the last 168  hours.  ABG  Recent Labs Lab 07/10/16 1229 07/10/16 1432 07/11/16 0817  PHART 7.324* 7.391 7.358  PCO2ART 38.6 33.4 36.5  PO2ART 121* 240.0* 119*    Liver Enzymes  Recent Labs Lab 07/07/16 0436  07/10/16 0540 07/11/16 0400 07/12/16 0506  AST 93*  --   --   --  51*  ALT 71*  --   --   --  50  ALKPHOS 304*  --   --   --  228*  BILITOT 0.5  --   --   --  0.3  ALBUMIN 1.4*  < > 1.5* 1.5* 1.5*  < > = values in this interval not displayed.  Cardiac Enzymes No results for input(s): TROPONINI, PROBNP in the last  168 hours.  Glucose  Recent Labs Lab 07/11/16 0847 07/11/16 1151 07/11/16 1537 07/11/16 1946 07/11/16 2349 07/12/16 0355  GLUCAP 180* 162* 136* 134* 169* 90    Imaging No results found.  STUDIES:  CT Head 9/27:  No acute abnormality. Chronic small vessel ischemic changes. Chronic lacunar infarct left basal ganglia & chronic ischemia right side of pons.  CT Abd/Pelvis w/o 9/27:  Early partial SBO w/ transition point LLQ with adhesions. Left humerus soft tissue swelling & intramuscular gas. Punctate hepatic granulomas noted.  Left Arm X-ray 9/27:  Diffuse soft tissue edema.  TTE 9/27:  LV w/ EF 60-65%. Grade 1 Diastolic dysfunction. No regional wall motion abnormalities. LA & RA normal in size. RV normal in size & function. Mild AS w/o AR. Mild MR w/o MS.  ABIs 10/3: R normal. L posterior artery noncompressible; suspect calcification.  CT head 10/3: No acute infarct, no hemorrhage or mass. Atrophy evident. Similar to prior study.   MICROBIOLOGY: MRSA PCR 9/27:  Negative Blood Ctx x 2 9/26: Coag Neg Staph 1/2 Urine Ctx 9/26:  5000 CFU E coli & 1000 CFU Klebsiella  C diff PCR 10/10:  Toxin & Antigen Positive  ANTIBIOTICS: Levaquin 9/26 - 9/28 Aztreonam 9/26 - 9/27 Vancomycin 9/26 - off Merrem 9/27 - 10/3 PO Vancocin 10/10 >>\ Flagyl IV 10/15 (while NGT is out) > 10/16  SIGNIFICANT EVENTS: 9/26 - admit for presumed septic shock  LINES/TUBES: OETT 7.5 9/26 - 10/14 OGT 9/26 - 10/14 L IJ CVL 9/26 >> Foley 9/26 >> RIJ HD 9/30 >> Rectal pouch 10/17 >> NG tube 10/17 >>  DISCUSSION: 65 y.o. female with T2DM, CKD, recurrent UTIs 2/2 incontinence, and stroke with subsequent severe debilitation (Left sided weakness; ECOG 4), who presented with AMS and found to be in shock, likely 2/2 UTI sepsis. Some improvement in level of alertness with CRRT. Completed 7-day course of meropenem 10/3. Extubated 10/14 but requiring intermittent noninvasive positive pressure ventilation. Also  being treated for C diff, on day 8 of PO vancomycin. To have tracheostomy 10/20. IR coordinating with ENT for replacement of HD cath with perm cath. Plan for discharge to [place].   ASSESSMENT / PLAN:  PULMONARY A: Acute Hypoxic Respiratory Failure - Reintubated due to inability to protect airway with severe deconditioning and weakness. Positive almost 5 L. 2L taken off 10/17. OHS  P:   Planning for tracheostomy; ENT consulted due to large goiter -- planning for procedure to be performed 10/20 Obtain daily CXR -- ETT retracted 4 cm 10/17 for R mainstem bronchus intubation; 10/18 new L-sided infiltrate Nastotracheal suctioning Incentive Spirometry  Weaning FiO2 for sat >92%  CARDIOVASCULAR A:  Shock - Likely sepsis. Cortisol 45.1. Resolved. H/O HTN & HLD Remains mildly  tachycardic P:  Vitals per unit protocol Continuous telemetry monitoring Metoprolol restarted 10/17 Diuresis with HD TThSa  RENAL A:   Acute On Chronic Renal Failure Stage IV  - worsens without HD treatments Profound Metabolic Acidosis - Resolved w/ HD. Hypokalemia to 3.0 10/18 Lactic Acidosis - Resolved. Likely due to hypoperfusion in setting of Metformin.   Hyperphosphatemia - Secondary to acute renal failure.  Pseudohypocalcemia - Resolved.   P:   Appreciate Nephrology recommendations TTS dialysis schedule Trending electrolytes daily  Replacing electrolytes as indicated -- give potassium citrate solution 20 meq x 3 Plan for perm-a-cath placement with tracheostomy  GASTROINTESTINAL A:   Partial SBO - Resolving on XR abd 9/30. Having BMs. H/O Ventral Hernia Repair. Possible Liver Cirrhosis. RUQ U/S normal. Ammonia WNL 10/5.  Transaminitis - Likely shock liver versus congestive hepatopathy. Improving.  Diarrhea - Secondary to C diff.   P:   NGT placed Hold tube feeds for possible ENT procedure Pepcid VT daily once replaced Trending LFTs Intermittently   HEMATOLOGIC A:   Anemia - Chronic. No signs  of active bleeding. S/P 1u on 10/6 & 1u on 10/11. Hgb relatively stable. Leukocytosis - Likely sepsis. Increasing again. 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 Unlikely with neg SRA. Thrombocytopenia - Resolved.  P:  Trending cell counts daily w/ CBC Transfusion goal of 7.0 SCDs Heparin Fenton q8hr  INFECTIOUS A:   Severe Sepsis  C Diff Colitis - Found 10/10 & started PO Vancocin. E.coli and Klebsiella UTI - S/P 7 days Merrem. Sacral wound New infiltrate on CXR  P:   PO/OGT s/p 8 days Vancomycin  Holding home Trimethoprim. Wound care consulted   ENDOCRINE A:   Hypoglycemia - Resolved with Dextrose IVF. H/O DM Type 2 - A1c 5.8. H/O Hyperthyroidism - On PTU at home. TSH 0.331 & F T4 1.33 9/26. Repeat TSH 0.458 & FT4 1.78 on 10/14.  P:   SSI per Sensitive Algorithm. Levemir 10 u BID, novolog tube feed coverage 2U q4h Holding PTU & Metformin. Methimazole '5mg'$  VT daily Obtain TSH  NEUROLOGIC A:   Acute Encephalopathy - Appears to be at baseline  Enema despite improved Ammonia.  Favor toxic metabolic as is gradually improving with CRRT. Repeat CT head unchanged. TSH WNL. Folate WNL. H/O CVA - Minimal mobility.  P:   Avoid sedating meds  MUSCULOSKELETAL A: Edema Left Arm - Improved. Secondary to IV infiltration. No gas on X-ray 9/27.  P: Continue to monitor   FAMILY  - Updates: Spoke with daughter Ms. Tisdale 10/17 over phone.   - Inter-disciplinary family meet or Palliative Care meeting due by: Continued [place] discussion 10/6 with daughter [name].   Olene Floss, MD [place], PGY-2

## 2016-07-12 NOTE — Procedures (Signed)
Interventional Radiology Procedure Note  Procedure: Placement of a 19 cm tip-to-cuff Palindrome HD catheter.  Tip in the upper RA and ready for use.   Complications: None  Estimated Blood Loss: None  Recommendations: - Routine line care   Signed,  Criselda Peaches, MD

## 2016-07-13 ENCOUNTER — Inpatient Hospital Stay (HOSPITAL_COMMUNITY): Payer: Medicare Other

## 2016-07-13 LAB — GLUCOSE, CAPILLARY
GLUCOSE-CAPILLARY: 123 mg/dL — AB (ref 65–99)
GLUCOSE-CAPILLARY: 126 mg/dL — AB (ref 65–99)
GLUCOSE-CAPILLARY: 171 mg/dL — AB (ref 65–99)
Glucose-Capillary: 106 mg/dL — ABNORMAL HIGH (ref 65–99)
Glucose-Capillary: 135 mg/dL — ABNORMAL HIGH (ref 65–99)

## 2016-07-13 LAB — RENAL FUNCTION PANEL
Albumin: 1.6 g/dL — ABNORMAL LOW (ref 3.5–5.0)
Anion gap: 9 (ref 5–15)
BUN: 66 mg/dL — AB (ref 6–20)
CHLORIDE: 102 mmol/L (ref 101–111)
CO2: 24 mmol/L (ref 22–32)
CREATININE: 3.98 mg/dL — AB (ref 0.44–1.00)
Calcium: 8.3 mg/dL — ABNORMAL LOW (ref 8.9–10.3)
GFR calc Af Amer: 13 mL/min — ABNORMAL LOW (ref >60)
GFR, EST NON AFRICAN AMERICAN: 11 mL/min — AB (ref >60)
GLUCOSE: 119 mg/dL — AB (ref 65–99)
POTASSIUM: 4 mmol/L (ref 3.5–5.1)
Phosphorus: 5.3 mg/dL — ABNORMAL HIGH (ref 2.5–4.6)
Sodium: 135 mmol/L (ref 135–145)

## 2016-07-13 LAB — CBC WITH DIFFERENTIAL/PLATELET
BASOS ABS: 0 10*3/uL (ref 0.0–0.1)
BASOS PCT: 0 %
EOS ABS: 0.2 10*3/uL (ref 0.0–0.7)
Eosinophils Relative: 1 %
HCT: 23.3 % — ABNORMAL LOW (ref 36.0–46.0)
Hemoglobin: 7.5 g/dL — ABNORMAL LOW (ref 12.0–15.0)
LYMPHS PCT: 6 %
Lymphs Abs: 1.2 10*3/uL (ref 0.7–4.0)
MCH: 28 pg (ref 26.0–34.0)
MCHC: 32.2 g/dL (ref 30.0–36.0)
MCV: 86.9 fL (ref 78.0–100.0)
MONO ABS: 0.8 10*3/uL (ref 0.1–1.0)
Monocytes Relative: 4 %
NEUTROS ABS: 18.4 10*3/uL — AB (ref 1.7–7.7)
NEUTROS PCT: 89 %
PLATELETS: 377 10*3/uL (ref 150–400)
RBC: 2.68 MIL/uL — ABNORMAL LOW (ref 3.87–5.11)
RDW: 16.2 % — AB (ref 11.5–15.5)
WBC: 20.6 10*3/uL — ABNORMAL HIGH (ref 4.0–10.5)

## 2016-07-13 LAB — T4, FREE: Free T4: 1.9 ng/dL — ABNORMAL HIGH (ref 0.61–1.12)

## 2016-07-13 LAB — MAGNESIUM: MAGNESIUM: 1.9 mg/dL (ref 1.7–2.4)

## 2016-07-13 MED ORDER — ACETYLCYSTEINE 20 % IN SOLN
4.0000 mL | Freq: Three times a day (TID) | RESPIRATORY_TRACT | Status: DC
  Administered 2016-07-13 – 2016-07-15 (×6): 4 mL via RESPIRATORY_TRACT
  Filled 2016-07-13 (×7): qty 4

## 2016-07-13 MED ORDER — NEPRO/CARBSTEADY PO LIQD
1000.0000 mL | ORAL | Status: DC
  Administered 2016-07-13 – 2016-07-14 (×2): 1000 mL via ORAL
  Administered 2016-07-15: 500 mL via ORAL
  Administered 2016-07-16: 237 mL via ORAL
  Filled 2016-07-13 (×8): qty 1000

## 2016-07-13 MED ORDER — HEPARIN SODIUM (PORCINE) 5000 UNIT/ML IJ SOLN: 5000.0000 [IU] | Freq: Three times a day (TID) | INTRAMUSCULAR | Status: DC

## 2016-07-13 MED ORDER — ALBUTEROL SULFATE (2.5 MG/3ML) 0.083% IN NEBU
2.5000 mg | INHALATION_SOLUTION | RESPIRATORY_TRACT | Status: DC | PRN
  Administered 2016-07-13 – 2016-07-15 (×6): 2.5 mg via RESPIRATORY_TRACT
  Filled 2016-07-13 (×6): qty 3

## 2016-07-13 MED ORDER — HEPARIN SODIUM (PORCINE) 5000 UNIT/ML IJ SOLN
5000.0000 [IU] | Freq: Three times a day (TID) | INTRAMUSCULAR | Status: AC
  Administered 2016-07-14 – 2016-07-16 (×8): 5000 [IU] via SUBCUTANEOUS
  Filled 2016-07-13 (×9): qty 1

## 2016-07-13 MED ORDER — VANCOMYCIN HCL IN DEXTROSE 1-5 GM/200ML-% IV SOLN
1000.0000 mg | INTRAVENOUS | Status: AC
  Administered 2016-07-17: 1000 mg via INTRAVENOUS
  Filled 2016-07-13: qty 200

## 2016-07-13 MED ORDER — ACETYLCYSTEINE 10 % IN SOLN
4.0000 mL | Freq: Three times a day (TID) | RESPIRATORY_TRACT | Status: DC
  Filled 2016-07-13 (×2): qty 4

## 2016-07-13 MED ORDER — INSULIN DETEMIR 100 UNIT/ML ~~LOC~~ SOLN
10.0000 [IU] | Freq: Every day | SUBCUTANEOUS | Status: DC
  Filled 2016-07-13: qty 0.1

## 2016-07-13 NOTE — Procedures (Signed)
I have seen and examined this patient and agree with the plan of care \. Patient seen on dialysis   K 4    Ca 8.3  Phos 5.2      Alb 1.6   [name] W 07/13/2016, 10:57 AM

## 2016-07-13 NOTE — Progress Notes (Signed)
Nutrition Follow-up  DOCUMENTATION CODES:   Obesity unspecified  INTERVENTION:    Nepro at 15 ml/h with Prostat 60 ml TID to provide 1248 kcals, 119 gm protein, 262 ml free water daily.   NUTRITION DIAGNOSIS:   Inadequate oral intake related to inability to eat as evidenced by NPO status.  Ongoing  GOAL:   Provide needs based on ASPEN/SCCM guidelines  Met  MONITOR:   TF tolerance, Skin, I & O's, Labs, Weight trends  ASSESSMENT:   65-y/o female with T2DM, CKD, recurrent UTIs 2/2 incontinence, and stroke with subsequent severe debilitation (ECOG 4), who presented with AMS and found to be in shock, likely 2/2 UTI sepsis.   Discussed patient with RN today. Plans for trach and PEG on 10/20. Patient is currently intubated on ventilator support MV: 9.5 L/min Temp (24hrs), Avg:98.5 F (36.9 C), Min:98.2 F (36.8 C), Max:99.4 F (37.4 C)  Cortrak in place. Patient is currently receiving Nepro at 15 ml/h with Prostat 60 ml TID to provide 1248 kcals, 119 gm protein, 262 ml free water daily. Tolerating well. Labs reviewed: phosphorus elevated. CBG's: [id number] Medications reviewed and include insulin, MVI.  Diet Order:  Diet NPO time specified Diet NPO time specified  Skin:  Wound (see comment) (stage II pressure injury to lip)  Last BM:  10/18  Height:   Ht Readings from Last 1 Encounters:  06/20/16 5' 6" (1.676 m)    Weight:   Wt Readings from Last 1 Encounters:  07/13/16 225 lb 8.5 oz (102.3 kg)    Ideal Body Weight:  59.1 kg  BMI:  Body mass index is 36.4 kg/m.  Estimated Nutritional Needs:   Kcal:  0768-0881  Protein:  >/= 118 gm  Fluid:  2 L  EDUCATION NEEDS:   No education needs identified at this time  Molli Barrows, [place], [place], [place] Pager [phone] After Hours Pager [phone]

## 2016-07-13 NOTE — Progress Notes (Signed)
STAFF NOTE: I, Dr Ann Lions have personally reviewed patient's available data, including medical history, events of note, physical examination and test results as part of my evaluation. I have discussed with resident/NP and other care providers such as pharmacist, RN and RRT.  In addition,  I personally evaluated patient and elicited key findings of   S: los 23 days.  On d9 c diff Rx. No fever. Trach 07/14/16. Some reports of et tube secretions +   O: obese RASS -2 equivalent - off sedation ftt SYnc with vent  A: acute on choronic resp failure - trach indicated C diff ongoing Rx ESRD - s.p perm HD cath   P: ENT trach 07/14/16 Full vent support Continue cvl for now Mucomyst for secretions   .  Rest per NP/medical resident whose note is outlined above and that I agree with  The patient is critically ill with multiple organ systems failure and requires high complexity decision making for assessment and support, frequent evaluation and titration of therapies, application of advanced monitoring technologies and extensive interpretation of multiple databases.   Critical Care Time devoted to patient care services described in this note is  30  Minutes. This time reflects time of care of this signee Dr Brand Males. This critical care time does not reflect procedure time, or teaching time or supervisory time of PA/NP/Med student/Med Resident etc but could involve care discussion time    Dr. Brand Males, M.D., [facility].C.P Pulmonary and Critical Care Medicine Staff Physician [facility] Pager: [phone], If no answer or between  15:00h - 7:00h: call [phone]  07/13/2016 8:54 AM

## 2016-07-13 NOTE — Progress Notes (Signed)
Spoke with patient's daughter Ms. Tisdale. She had just spoken with IR and was already aware of plan for peg-tube placement. She was told this likely would be done Monday, 10/23.   Olene Floss, MD [place], PGY-2

## 2016-07-13 NOTE — Progress Notes (Signed)
PULMONARY / CRITICAL CARE MEDICINE   Name: Angelica Beck MRN: [id number] DOB: 07-May-1951    ADMISSION DATE:  06/20/2016 CONSULTATION DATE:  06/20/2016  REFERRING MD:  EDP Dr Cathleen Fears   CHIEF COMPLAINT:  AMS  HISTORY OF PRESENT ILLNESS:   65 year old female with past medical history as below, which is significant for stroke with subsequent debilitation, diabetes, chronic kidney disease, chronic urinary tract infections due to incontinence, for which she takes chronic antibiotics. She lives at home with her daughter where she requires full assistance she is unable to truly ambulate and spends most of her time in a hospital bed/recliner. She relies on family members almost entirely for activities of daily living. She was in her usual state of health until about 9/23 when she developed nausea and vomiting. This persisted for 2 days until symptoms subsided, however, she continued to complain of abdominal pain. 9/26 when the patient's daughter got home from work the patient was found to be unresponsive. EMS was called and she was found hypoglycemic which was corrected, however, altered mental status did not improve. Upon arrival to the emergency department she continued to be lethargic and was found to be hypotensive with hypoxemia. Laboratory evaluation significant for potassium 7.4, bicarbonate less than 7, creatinine 6.51, BUN 95, WBC 12.3, lactic acid 13.88, pH 7.07.  SUBJECTIVE:  Reintubated 10/16. Patient nods no to any chest pain or difficulty breathing at this time. Nods no to any nausea or belly pain. Her nurse reports increased  REVIEW OF SYSTEMS:  Denies pain. Denies trouble breathing.   VITAL SIGNS: BP 132/66   Pulse 89   Temp 98.7 F (37.1 C) (Oral)   Resp 16   Ht 5' 6" (1.676 m)   Wt 225 lb 8.5 oz (102.3 kg)   SpO2 100%   BMI 36.40 kg/m   HEMODYNAMICS:    VENTILATOR SETTINGS: Vent Mode: PRVC FiO2 (%):  [40 %] 40 % Set Rate:  [15 bmp] 15 bmp Vt Set:  [470 mL] 470  mL PEEP:  [5 cmH20] 5 cmH20 Pressure Support:  [10 cmH20] 10 cmH20 Plateau Pressure:  [21 cmH20-30 cmH20] 22 cmH20  INTAKE / OUTPUT: I/O last 3 completed shifts: In: 711 [I.V.:130; NG/GT:581] Out: 1 [Stool:1]  PHYSICAL EXAMINATION: General:  Obese female. ETT in place. No family at bedside. Neuro: Nods appropriately. Tracks to voice. Following commands. HEENT:  No scleral icterus. Goiter noted. Moving neck well.  Cardiovascular:  Regular rhythm. Ansarca persists but is improving. 2+ PE to knees bilaterally Pulmonary: Normal work of breathing. Good aeration bilaterally.  Abdomen:  Soft. Protuberant. Normoactive bowel sounds. Musculoskeletal:  No joint deformity or effusion. Lifting head off of bed. Skin:  Warm & dry. No rash on exposed skin.  LABS:  BMET  Recent Labs Lab 07/10/16 0540 07/11/16 0400 07/12/16 0506  NA 135 138 135  K 4.1 3.9 3.0*  CL 99* 102 99*  CO2 21* 22 25  BUN 82* 106* 58*  CREATININE 3.72* 4.38* 3.32*  GLUCOSE 138* 219* 92    Electrolytes  Recent Labs Lab 07/10/16 0540 07/11/16 0400 07/12/16 0506 07/13/16 0406  CALCIUM 9.1 8.6* 8.6*  --   MG 1.9 2.0 1.8 1.9  PHOS 6.8* 7.5* 4.6  --     CBC  Recent Labs Lab 07/11/16 0930 07/12/16 0506 07/13/16 0406  WBC 22.2* 20.1* 20.6*  HGB 9.0* 7.8* 7.5*  HCT 27.1* 23.7* 23.3*  PLT 459* 373 377    Coag's  Recent Labs Lab 07/10/16 1300 07/11/16 0400  APTT  --  30  INR 1.16 1.32    Sepsis Markers No results for input(s): LATICACIDVEN, PROCALCITON, O2SATVEN in the last 168 hours.  ABG  Recent Labs Lab 07/10/16 1229 07/10/16 1432 07/11/16 0817  PHART 7.324* 7.391 7.358  PCO2ART 38.6 33.4 36.5  PO2ART 121* 240.0* 119*    Liver Enzymes  Recent Labs Lab 07/07/16 0436  07/10/16 0540 07/11/16 0400 07/12/16 0506  AST 93*  --   --   --  51*  ALT 71*  --   --   --  50  ALKPHOS 304*  --   --   --  228*  BILITOT 0.5  --   --   --  0.3  ALBUMIN 1.4*  < > 1.5* 1.5* 1.5*  < > =  values in this interval not displayed.  Cardiac Enzymes No results for input(s): TROPONINI, PROBNP in the last 168 hours.  Glucose  Recent Labs Lab 07/12/16 0747 07/12/16 1122 07/12/16 1524 07/12/16 1945 07/12/16 2320 07/13/16 0347  GLUCAP 79 85 100* 110* 132* 123*    Imaging Ir Fluoro Guide Cv Line Right  Result Date: 07/12/2016 INDICATION: 65 year old female with end-stage renal disease on hemodialysis. She currently has a temporary common non tunneled Trialysis catheter and requires placement of a more durable tunneled hemodialysis catheter. EXAM: TUNNELED CENTRAL VENOUS HEMODIALYSIS CATHETER PLACEMENT WITH ULTRASOUND AND FLUOROSCOPIC GUIDANCE MEDICATIONS: 1 g vancomycin . The antibiotic was given in an appropriate time interval prior to skin puncture. ANESTHESIA/SEDATION: Moderate (conscious) sedation was employed during this procedure. A total of Versed 2 mg and Fentanyl 100 mcg was administered intravenously. Moderate Sedation Time: 22 minutes. The patient's level of consciousness and vital signs were monitored continuously by radiology nursing throughout the procedure under my direct supervision. FLUOROSCOPY TIME:  Fluoroscopy Time: 1 minutes 24 seconds (8 mGy). COMPLICATIONS: None immediate. PROCEDURE: Informed written consent was obtained from the patient after a discussion of the risks, benefits, and alternatives to treatment. Questions regarding the procedure were encouraged and answered. The right neck and chest were prepped with chlorhexidine in a sterile fashion, and a sterile drape was applied covering the operative field. Maximum barrier sterile technique with sterile gowns and gloves were used for the procedure. A timeout was performed prior to the initiation of the procedure. After creating a small venotomy incision, a micropuncture kit was utilized to access the right internal jugular vein under direct, real-time ultrasound guidance after the overlying soft tissues were  anesthetized with 1% lidocaine with epinephrine. Ultrasound image documentation was performed. The microwire was kinked to measure appropriate catheter length. A stiff Glidewire was advanced to the level of the IVC and the micropuncture sheath was exchanged for a peel-away sheath. A Palindrome tunneled hemodialysis catheter measuring 19 cm from tip to cuff was tunneled in a retrograde fashion from the anterior chest wall to the venotomy incision. The catheter was then placed through the peel-away sheath with tips ultimately positioned within the superior aspect of the right atrium. Final catheter positioning was confirmed and documented with a spot radiographic image. The catheter aspirates and flushes normally. The catheter was flushed with appropriate volume heparin dwells. The catheter exit site was secured with a 0-Prolene retention suture. The venotomy incision was closed with an interrupted 4-0 Vicryl, Dermabond and Steri-strips. Dressings were applied. The patient tolerated the procedure well without immediate post procedural complication. IMPRESSION: Successful placement of 19 cm tip to cuff tunneled hemodialysis catheter via the right internal jugular vein with tips terminating within  the superior aspect of the right atrium. The catheter is ready for immediate use. Signed, Criselda Peaches, MD [facility] Radiology Electronically Signed   By: [name] M.D.   On: 07/12/2016 10:44   Ir US Guide Vasc Access Right  Result Date: 07/12/2016 INDICATION: 65 year old female with end-stage renal disease on hemodialysis. She currently has a temporary common non tunneled Trialysis catheter and requires placement of a more durable tunneled hemodialysis catheter. EXAM: TUNNELED CENTRAL VENOUS HEMODIALYSIS CATHETER PLACEMENT WITH ULTRASOUND AND FLUOROSCOPIC GUIDANCE MEDICATIONS: 1 g vancomycin . The antibiotic was given in an appropriate time interval prior  to skin puncture. ANESTHESIA/SEDATION: Moderate (conscious) sedation was employed during this procedure. A total of Versed 2 mg and Fentanyl 100 mcg was administered intravenously. Moderate Sedation Time: 22 minutes. The patient's level of consciousness and vital signs were monitored continuously by radiology nursing throughout the procedure under my direct supervision. FLUOROSCOPY TIME:  Fluoroscopy Time: 1 minutes 24 seconds (8 mGy). COMPLICATIONS: None immediate. PROCEDURE: Informed written consent was obtained from the patient after a discussion of the risks, benefits, and alternatives to treatment. Questions regarding the procedure were encouraged and answered. The right neck and chest were prepped with chlorhexidine in a sterile fashion, and a sterile drape was applied covering the operative field. Maximum barrier sterile technique with sterile gowns and gloves were used for the procedure. A timeout was performed prior to the initiation of the procedure. After creating a small venotomy incision, a micropuncture kit was utilized to access the right internal jugular vein under direct, real-time ultrasound guidance after the overlying soft tissues were anesthetized with 1% lidocaine with epinephrine. Ultrasound image documentation was performed. The microwire was kinked to measure appropriate catheter length. A stiff Glidewire was advanced to the level of the IVC and the micropuncture sheath was exchanged for a peel-away sheath. A Palindrome tunneled hemodialysis catheter measuring 19 cm from tip to cuff was tunneled in a retrograde fashion from the anterior chest wall to the venotomy incision. The catheter was then placed through the peel-away sheath with tips ultimately positioned within the superior aspect of the right atrium. Final catheter positioning was confirmed and documented with a spot radiographic image. The catheter aspirates and flushes normally. The catheter was flushed with appropriate volume  heparin dwells. The catheter exit site was secured with a 0-Prolene retention suture. The venotomy incision was closed with an interrupted 4-0 Vicryl, Dermabond and Steri-strips. Dressings were applied. The patient tolerated the procedure well without immediate post procedural complication. IMPRESSION: Successful placement of 19 cm tip to cuff tunneled hemodialysis catheter via the right internal jugular vein with tips terminating within the superior aspect of the right atrium. The catheter is ready for immediate use. Signed, Criselda Peaches, MD [facility] Radiology Electronically Signed   By: [name] M.D.   On: 07/12/2016 10:44    STUDIES:  CT Head 9/27:  No acute abnormality. Chronic small vessel ischemic changes. Chronic lacunar infarct left basal ganglia & chronic ischemia right side of pons.  CT Abd/Pelvis w/o 9/27:  Early partial SBO w/ transition point LLQ with adhesions. Left humerus soft tissue swelling & intramuscular gas. Punctate hepatic granulomas noted.  Left Arm X-ray 9/27:  Diffuse soft tissue edema.  TTE 9/27:  LV w/ EF 60-65%. Grade 1 Diastolic dysfunction. No regional wall motion abnormalities. LA & RA normal in size. RV normal in size & function. Mild AS w/o AR. Mild MR w/o MS.  ABIs 10/3: R normal. L posterior artery noncompressible; suspect calcification.  CT head 10/3: No acute infarct, no hemorrhage or mass. Atrophy evident. Similar to prior study.   MICROBIOLOGY: MRSA PCR 9/27:  Negative Blood Ctx x 2 9/26: Coag Neg Staph 1/2 Urine Ctx 9/26:  5000 CFU E coli & 1000 CFU Klebsiella  C diff PCR 10/10:  Toxin & Antigen Positive  ANTIBIOTICS: Levaquin 9/26 - 9/28 Aztreonam 9/26 - 9/27 Vancomycin 9/26 - off Merrem 9/27 - 10/3 PO Vancomycin 10/10 >>_0 Flagyl IV 10/15 (while NGT is out) > 10/16  SIGNIFICANT EVENTS: 9/26 - admit for presumed septic shock  LINES/TUBES: OETT 7.5 9/26 - 10/14 OGT 9/26 -  10/14 OGT 10/16 >> L IJ CVL 9/26 >> Foley 9/26 >> 10/8 RIJ HD 9/30 >> 10/18 Rectal pouch 10/17 > 10/18 NG tube 10/17 >> R HD Perm Cath 10/18 >>  DISCUSSION: 65 y.o. female with T2DM, CKD, recurrent UTIs 2/2 incontinence, and stroke with subsequent severe debilitation (Left sided weakness; ECOG 4), who presented with AMS and found to be in shock, likely 2/2 UTI sepsis. Some improvement in level of alertness with CRRT. Completed 7-day course of meropenem 10/3. Extubated 10/14 but requiring intermittent noninvasive positive pressure ventilation. Also being treated for C diff, on day 9 of PO vancomycin. Plan for tracheostomy by ENT 10/20. Have contacted IR about PEG-tube placement.  ASSESSMENT / PLAN:  PULMONARY A: Acute Hypoxic Respiratory Failure - Reintubated due to inability to protect airway with severe deconditioning and weakness. Positive almost 4L. OHS  P:   Planning for tracheostomy 10/20 by ENT Obtain daily CXR Nastotracheal suctioning Incentive Spirometry  Weaning FiO2 for sat >92%  CARDIOVASCULAR A:  Shock - Likely sepsis. Cortisol 45.1. Resolved. H/O HTN & HLD  P:  Vitals per unit protocol Continuous telemetry monitoring Diuresis with HD TThSa  RENAL A:   Acute On Chronic Renal Failure Stage IV  - worsens without HD treatments Profound Metabolic Acidosis - Resolved w/ HD. Hyperkalemia - Resolved.  Lactic Acidosis - Resolved. Likely due to hypoperfusion in setting of Metformin.   Hyperphosphatemia - Secondary to acute renal failure.  Pseudohypocalcemia - Resolved.   P:   Appreciate Nephrology recommendations Next dialysis session planned for today, 10/19 Trending electrolytes daily Replacing electrolytes as indicated Perm-a-cath placed 10/18, with previous HD cath removed  GASTROINTESTINAL A:   Partial SBO - Resolving on XR abd 9/30. Having BMs. H/O Ventral Hernia Repair. Possible Liver Cirrhosis. RUQ U/S normal. Ammonia WNL 10/5.  Transaminitis -  Likely shock liver versus congestive hepatopathy. Improving.  Diarrhea - Secondary to C diff.   P:   NGT placed Hold tube feeds at midnight for tracheostomy tomorrow, 10/20 Pepcid VT daily once replaced Trending LFTs Intermittently  Consulted IR about possible G-tube placement   HEMATOLOGIC A:   Anemia - Chronic. No signs of active bleeding. S/P 1u on 10/6 & 1u on 10/11. Hgb relatively stable. Leukocytosis - Likely sepsis. Resolving. 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 Unlikely with neg SRA. Thrombocytopenia - Resolved.  P:  Trending cell counts daily w/ CBC Transfusion goal of 7.0 SCDs Heparin  q8hr Aspirin 162 mg  INFECTIOUS A:   Severe Sepsis  C Diff Colitis - Found 10/10 & started PO Vancocin. E.coli and Klebsiella UTI - S/P 7 days Merrem. Sacral wound  P:   PO/OGT Vancomycin Day #9 (Day #10 abx, received flagyl when temporarily lost NG access) Holding home Trimethoprim. Wound care consulted Plan to re-culture for fever   ENDOCRINE A:  Hypoglycemia - Resolved with Dextrose IVF. H/O DM Type 2 - A1c 5.8. H/O Hyperthyroidism - On PTU at home. TSH 0.331 & F T4 1.33 9/26. Repeat TSH 0.458 & FT4 1.78 on 10/14. TSH 0.286 10/18.  P:   SSI per Sensitive Algorithm. Decrease levemir to 10 U daily from BID; hold when NPO tomorrow Holding PTU & Metformin. Methimazole 57m VT daily Obtain free T4  NEUROLOGIC A:   Acute Encephalopathy - Question hepatic vs toxic metabolic vs hypoxia. No improvement in Lactulose Enema despite improved Ammonia.  Favor toxic metabolic as is gradually improving with CRRT. Repeat CT head unchanged. Folate WNL. H/O CVA - Minimal mobility.  P:   Avoid sedating meds  MUSCULOSKELETAL A: Edema Left Arm - Improved. Secondary to IV infiltration. No gas on X-ray 9/27.  P: Continue to monitor   FAMILY  - Updates: No family at bedside 10/19. Updated daughter 10/18.   - Inter-disciplinary family meet or Palliative Care meeting due by: Continued [place] discussion 10/6 with daughter KJeannie Fend Wants aggressive treatment.   HOlene Floss MD [facility] [place] PGY-2

## 2016-07-13 NOTE — Progress Notes (Signed)
Patient ID: Angelica Beck, female   DOB: 01-Jan-1951, 65 y.o.   MRN: [id number]    Referring Physician(s): Dr. Brand Males  Supervising Physician: Sandi Mariscal  Patient Status: [facility] - In-pt  Chief Complaint: VDRF  Subjective: The patient is known to [place] for placing a tunneled HD cath in yesterday.  She is on the vent and will undergo tracheostomy tomorrow.  The daughter and the CCM team have discussed g-tube placement once her trach is in place.  We have been requested for this placement.  Allergies: Penicillins and Lactose intolerance (gi)  Medications: Prior to Admission medications   Medication Sig Start Date End Date Taking? Authorizing Provider  Cholecalciferol (VITAMIN D) 2000 UNITS tablet Take 2,000 Units by mouth daily.   Yes Historical Provider, MD  clopidogrel (PLAVIX) 75 MG tablet Take 1 tablet (75 mg total) by mouth daily. 08/24/15  Yes Daniel J Angiulli, PA-C  gabapentin (NEURONTIN) 100 MG capsule Take 1 capsule (100 mg total) by mouth at bedtime. 08/24/15  Yes Daniel J Angiulli, PA-C  insulin glargine (LANTUS) 100 UNIT/ML injection 10 units at breakfast and 20 units every evening 08/24/15  Yes Daniel J Angiulli, PA-C  metFORMIN (GLUCOPHAGE) 1000 MG tablet Take 1,000 mg by mouth 2 (two) times daily with a meal.   Yes Historical Provider, MD  metoprolol succinate (TOPROL-XL) 50 MG 24 hr tablet Take 50 mg by mouth daily. Take with or immediately following a meal.   Yes Historical Provider, MD  Multiple Vitamin (MULTIVITAMIN WITH MINERALS) TABS tablet Take 1 tablet by mouth daily. 08/24/15  Yes Daniel J Angiulli, PA-C  niacin (NIASPAN) 1000 MG CR tablet Take 1 tablet (1,000 mg total) by mouth at bedtime. 08/24/15  Yes Daniel J Angiulli, PA-C  nitroGLYCERIN (NITROSTAT) 0.4 MG SL tablet Place 1 tablet (0.4 mg total) under the tongue every 5 (five) minutes as needed for chest pain. 01/21/14  Yes Belkys A Regalado, MD  OXYGEN Inhale 2 L into the lungs at bedtime.   Yes  Historical Provider, MD  propylthiouracil (PTU) 50 MG tablet Take 1 tablet (50 mg total) by mouth 2 (two) times daily. 08/24/15  Yes Daniel J Angiulli, PA-C  ranitidine (ZANTAC) 150 MG tablet Take 150 mg by mouth 2 (two) times daily.  06/05/13  Yes Historical Provider, MD  rosuvastatin (CRESTOR) 40 MG tablet Take 1 tablet (40 mg total) by mouth at bedtime. 08/24/15  Yes Daniel J Angiulli, PA-C  traMADol (ULTRAM) 50 MG tablet Take 50 mg by mouth every 12 (twelve) hours as needed for severe pain.   Yes Historical Provider, MD  trimethoprim (TRIMPEX) 100 MG tablet Take 1 tablet by mouth daily. 06/13/16  Yes Historical Provider, MD  vitamin C (ASCORBIC ACID) 500 MG tablet Take 500 mg by mouth daily.   Yes Historical Provider, MD    Vital Signs: BP 129/74   Pulse (!) 108   Temp 98 F (36.7 C) (Oral)   Resp (!) 24   Ht _0  (1.676 m)   Wt 220 lb 0.3 oz (99.8 kg)   SpO2 100%   BMI 35.51 kg/m   Physical Exam: Gen: ill-appearing black female  Heart: regular, but tachy Lungs: coarse sounds bilaterally with some rhonchi, ETT in place. Abd: soft, obese, NT, ND  Imaging: Ir Fluoro Guide Cv Line Right  Result Date: 07/12/2016 INDICATION: 65 year old female with end-stage renal disease on hemodialysis. She currently has a temporary common non tunneled Trialysis catheter and requires placement of a more durable tunneled hemodialysis catheter.  EXAM: TUNNELED CENTRAL VENOUS HEMODIALYSIS CATHETER PLACEMENT WITH ULTRASOUND AND FLUOROSCOPIC GUIDANCE MEDICATIONS: 1 g vancomycin . The antibiotic was given in an appropriate time interval prior to skin puncture. ANESTHESIA/SEDATION: Moderate (conscious) sedation was employed during this procedure. A total of Versed 2 mg and Fentanyl 100 mcg was administered intravenously. Moderate Sedation Time: 22 minutes. The patient's level of consciousness and vital signs were monitored continuously by radiology nursing throughout the procedure under my direct supervision.  FLUOROSCOPY TIME:  Fluoroscopy Time: 1 minutes 24 seconds (8 mGy). COMPLICATIONS: None immediate. PROCEDURE: Informed written consent was obtained from the patient after a discussion of the risks, benefits, and alternatives to treatment. Questions regarding the procedure were encouraged and answered. The right neck and chest were prepped with chlorhexidine in a sterile fashion, and a sterile drape was applied covering the operative field. Maximum barrier sterile technique with sterile gowns and gloves were used for the procedure. A timeout was performed prior to the initiation of the procedure. After creating a small venotomy incision, a micropuncture kit was utilized to access the right internal jugular vein under direct, real-time ultrasound guidance after the overlying soft tissues were anesthetized with 1% lidocaine with epinephrine. Ultrasound image documentation was performed. The microwire was kinked to measure appropriate catheter length. A stiff Glidewire was advanced to the level of the IVC and the micropuncture sheath was exchanged for a peel-away sheath. A Palindrome tunneled hemodialysis catheter measuring 19 cm from tip to cuff was tunneled in a retrograde fashion from the anterior chest wall to the venotomy incision. The catheter was then placed through the peel-away sheath with tips ultimately positioned within the superior aspect of the right atrium. Final catheter positioning was confirmed and documented with a spot radiographic image. The catheter aspirates and flushes normally. The catheter was flushed with appropriate volume heparin dwells. The catheter exit site was secured with a 0-Prolene retention suture. The venotomy incision was closed with an interrupted 4-0 Vicryl, Dermabond and Steri-strips. Dressings were applied. The patient tolerated the procedure well without immediate post procedural complication. IMPRESSION: Successful placement of 19 cm tip to cuff tunneled hemodialysis catheter  via the right internal jugular vein with tips terminating within the superior aspect of the right atrium. The catheter is ready for immediate use. Signed, Criselda Peaches, MD [facility] Radiology Electronically Signed   By: [name] M.D.   On: 07/12/2016 10:44   Ir US Guide Vasc Access Right  Result Date: 07/12/2016 INDICATION: 65 year old female with end-stage renal disease on hemodialysis. She currently has a temporary common non tunneled Trialysis catheter and requires placement of a more durable tunneled hemodialysis catheter. EXAM: TUNNELED CENTRAL VENOUS HEMODIALYSIS CATHETER PLACEMENT WITH ULTRASOUND AND FLUOROSCOPIC GUIDANCE MEDICATIONS: 1 g vancomycin . The antibiotic was given in an appropriate time interval prior to skin puncture. ANESTHESIA/SEDATION: Moderate (conscious) sedation was employed during this procedure. A total of Versed 2 mg and Fentanyl 100 mcg was administered intravenously. Moderate Sedation Time: 22 minutes. The patient's level of consciousness and vital signs were monitored continuously by radiology nursing throughout the procedure under my direct supervision. FLUOROSCOPY TIME:  Fluoroscopy Time: 1 minutes 24 seconds (8 mGy). COMPLICATIONS: None immediate. PROCEDURE: Informed written consent was obtained from the patient after a discussion of the risks, benefits, and alternatives to treatment. Questions regarding the procedure were encouraged and answered. The right neck and chest were prepped with chlorhexidine in a sterile fashion, and a sterile drape was applied covering the operative field. Maximum  barrier sterile technique with sterile gowns and gloves were used for the procedure. A timeout was performed prior to the initiation of the procedure. After creating a small venotomy incision, a micropuncture kit was utilized to access the right internal jugular vein under direct, real-time ultrasound guidance after the  overlying soft tissues were anesthetized with 1% lidocaine with epinephrine. Ultrasound image documentation was performed. The microwire was kinked to measure appropriate catheter length. A stiff Glidewire was advanced to the level of the IVC and the micropuncture sheath was exchanged for a peel-away sheath. A Palindrome tunneled hemodialysis catheter measuring 19 cm from tip to cuff was tunneled in a retrograde fashion from the anterior chest wall to the venotomy incision. The catheter was then placed through the peel-away sheath with tips ultimately positioned within the superior aspect of the right atrium. Final catheter positioning was confirmed and documented with a spot radiographic image. The catheter aspirates and flushes normally. The catheter was flushed with appropriate volume heparin dwells. The catheter exit site was secured with a 0-Prolene retention suture. The venotomy incision was closed with an interrupted 4-0 Vicryl, Dermabond and Steri-strips. Dressings were applied. The patient tolerated the procedure well without immediate post procedural complication. IMPRESSION: Successful placement of 19 cm tip to cuff tunneled hemodialysis catheter via the right internal jugular vein with tips terminating within the superior aspect of the right atrium. The catheter is ready for immediate use. Signed, Criselda Peaches, MD [facility] Radiology Electronically Signed   By: [name] M.D.   On: 07/12/2016 10:44   Dg Chest Port 1 View  Result Date: 07/13/2016 CLINICAL DATA:  Shortness of breath. EXAM: PORTABLE CHEST 1 VIEW COMPARISON:  07/12/2016. FINDINGS: Endotracheal tube, left IJ line, feeding tube, right IJ dual-lumen catheter in stable position. Persistent atelectasis and consolidation left lower lobe. Cardiomegaly with pulmonary vascular prominence and bilateral interstitial prominence consistent congestive heart failure. Left pleural  effusion noted. No pneumothorax. IMPRESSION: 1. Interim placement of right IJ dual-lumen catheter. Its tip is at the cavoatrial junction. Remaining lines and tubes in stable position. 2.  Persistent left lower lobe atelectasis and consolidation. 3. Cardiomegaly with pulmonary vascular prominence and interstitial prominence. Left pleural effusion noted. A component of congestive heart failure may be present. Electronically Signed   By: Marcello Moores  Register   On: 07/13/2016 07:47   Dg Chest Port 1 View  Result Date: 07/12/2016 CLINICAL DATA:  Shortness of breath.  Intubation. EXAM: PORTABLE CHEST 1 VIEW COMPARISON:  07/10/2016. FINDINGS: Endotracheal tube is been withdrawn slightly. Its tip is 1 cm above the carina. Proximal repositioning of 2 cm should be considered. Feeding tube tip below left hemidiaphragm. Left IJ line in stable position. Stable cardiomegaly. Persistent left lower lobe atelectasis and consolidation. Mild infiltrate left upper lobe cannot be excluded. Mild right base subsegmental atelectasis. Degenerative changes thoracic spine . IMPRESSION: 1. Endotracheal tube is been withdrawn, its tip is approximately 1 cm above the carina. Proximal repositioning of 2 cm should be considered. Feeding tube tip below left hemidiaphragm. Left IJ line in stable position. 2. Persistent dense atelectasis and consolidation in the left lower lobe. Mild infiltrate left upper lobe cannot be excluded. Mild right base subsegmental atelectasis . Electronically Signed   By: Marcello Moores  Register   On: 07/12/2016 07:13   Dg Chest Port 1 View  Addendum Date: 07/10/2016   ADDENDUM REPORT: 07/10/2016 14:19 ADDENDUM: Study discussed by telephone with Dr. Brand Males on 07/10/2016 at 1410 hours. Electronically Signed  By: Genevie Ann M.D.   On: 07/10/2016 14:19   Result Date: 07/10/2016 CLINICAL DATA:  65 year old female with respiratory failure. Intubated. Initial encounter. EXAM: PORTABLE CHEST 1 VIEW COMPARISON:   07/07/2016 and earlier. FINDINGS: Portable AP semi upright view at 1330 hours today. Right mainstem intubation now. The ET tube should be pulled back 4 cm to allow for placement above the carina and below the clavicles. Stable bilateral IJ approach catheters, that from the right courses into the left innominate vein as before. Stable visible enteric tube. Continued dense opacification in the left mid and lower lung with some air bronchograms. Stable cardiac size and mediastinal contours. No pneumothorax, pulmonary edema, or right pleural effusion. IMPRESSION: 1. Right mainstem bronchus intubation.  Retract the ET Tube 4 cm. 2. Right lower lobe collapse or consolidation. 3. Otherwise stable lines and tubes. Note that the right IJ approach catheter still terminates in the left innominate vein. Electronically Signed: By: Genevie Ann M.D. On: 07/10/2016 14:04   Dg Abd Portable 1v  Result Date: 07/10/2016 CLINICAL DATA:  Feeding tube insertion EXAM: PORTABLE ABDOMEN - 1 VIEW COMPARISON:  Portable exam 1128 hours compared to 06/24/2016 FINDINGS: Tip of feeding tube projects over ligament of Treitz. Decreased small bowel distention. No bowel dilatation or definite wall thickening. 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 LEFT lower lobe atelectasis versus consolidation. IMPRESSION: Tip of feeding tube projects over ligament of Treitz. Electronically Signed   By: Lavonia Dana M.D.   On: 07/10/2016 11:36    Labs:  CBC:  Recent Labs  07/11/16 0400 07/11/16 0930 07/12/16 0506 07/13/16 0406  WBC 15.8* 22.2* 20.1* 20.6*  HGB 7.8* 9.0* 7.8* 7.5*  HCT 24.1* 27.1* 23.7* 23.3*  PLT 378 459* 373 377    COAGS:  Recent Labs  06/25/16 0400 06/25/16 0650 06/26/16 0413 06/27/16 0420 06/29/16 0914 07/10/16 1300 07/11/16 0400  INR  --  1.76  --   --  1.38 1.16 1.32  APTT 147*  --  175* 151*  --   --  30    BMP:  Recent Labs  07/10/16 0540  07/11/16 0400 07/12/16 0506 07/13/16 0652  NA 135 138 135 135  K 4.1 3.9 3.0* 4.0  CL 99* 102 99* 102  CO2 21* _0 GLUCOSE 138* 219* 92 119*  BUN 82* 106* 58* 66*  CALCIUM 9.1 8.6* 8.6* 8.3*  CREATININE 3.72* 4.38* 3.32* 3.98*  GFRNONAA 12* 10* 14* 11*  GFRAA 14* 11* 16* 13*    LIVER FUNCTION TESTS:  Recent Labs  07/03/16 0430  07/05/16 0345  07/07/16 0436  07/10/16 0540 07/11/16 0400 07/12/16 0506 07/13/16 0652  BILITOT 0.5  --  0.7  --  0.5  --   --   --  0.3  --   AST 450*  --  145*  --  93*  --   --   --  51*  --   ALT 208*  --  99*  --  71*  --   --   --  50  --   ALKPHOS 480*  --  360*  --  304*  --   --   --  228*  --   PROT 5.1*  --  5.7*  --  5.5*  --   --   --  5.7*  --   ALBUMIN 1.6*  < > 1.5*  < > 1.4*  < > 1.5* 1.5* 1.5* 1.6*  < > =  values in this interval not displayed.  Assessment and Plan: 1. VDRF -the patient is going to get a tracheostomy tomorrow in the OR by ENT due to a large thyroid goiter.  We will tentatively plan for g-tube placement on Monday. -orders have been written as such  -the daughter was contacted and verbal phone consent was obtained -Risks and Benefits discussed with the patient's daughter including, but not limited to the need for a barium enema during the procedure, bleeding, infection, peritonitis, or damage to adjacent structures. All of the patient's daughter's questions were answered, patient's daughter is agreeable to proceed. Consent signed and in chart.    Electronically Signed: Henreitta Cea 07/13/2016, 4:01 PM   I spent a total of 15 Minutes at the the patient's bedside AND on the patient's hospital floor or unit, greater than 50% of which was counseling/coordinating care for ventilator dependent respiratory failure

## 2016-07-13 NOTE — Progress Notes (Signed)
Dialysis treatment completed.  3000 mL ultrafiltrated.  2500 mL net fluid removal.  Patient status unchanged. Lung sounds coarse to ausculation in all fields. Genealized edema. Cardiac: ST.  Cleansed RIJ catheter with chlorhexidine.  Disconnected lines and flushed ports with saline per protocol.  Ports locked with heparin and capped per protocol.    Report given to bedside, RN Verdis Frederickson.

## 2016-07-13 NOTE — Progress Notes (Signed)
Arrived to patient room 51M-07 at 1030.  Reviewed treatment plan and this RN agrees with plan.  Report received from bedside RN, Verdis Frederickson.  Consent verified.  Patient Alert, intubated and ventilated.   Lung sounds rhonchi to ausculation in all fields. Moderate generalized edema. Cardiac:  NSR to ST.  Removed caps and cleansed RIJ catheter with chlorhedxidine.  Aspirated ports of heparin and flushed them with saline per protocol.  Connected and secured lines, initiated treatment at 1047.  UF Goal of 3525m and net fluid removal 3L.  Will continue to monitor.

## 2016-07-14 ENCOUNTER — Inpatient Hospital Stay (HOSPITAL_COMMUNITY): Payer: Medicare Other

## 2016-07-14 ENCOUNTER — Inpatient Hospital Stay (HOSPITAL_COMMUNITY): Payer: Medicare Other | Admitting: Certified Registered Nurse Anesthetist

## 2016-07-14 ENCOUNTER — Encounter (HOSPITAL_COMMUNITY)
Admission: EM | Disposition: A | Discharge status: Nursing Home (Includes ICF) | Payer: Self-pay | Source: Home / Self Care | Attending: Internal Medicine

## 2016-07-14 HISTORY — PX: TRACHEOSTOMY TUBE PLACEMENT: SHX814

## 2016-07-14 LAB — GLUCOSE, CAPILLARY
GLUCOSE-CAPILLARY: 172 mg/dL — AB (ref 65–99)
GLUCOSE-CAPILLARY: 193 mg/dL — AB (ref 65–99)
GLUCOSE-CAPILLARY: 224 mg/dL — AB (ref 65–99)
Glucose-Capillary: 147 mg/dL — ABNORMAL HIGH (ref 65–99)
Glucose-Capillary: 201 mg/dL — ABNORMAL HIGH (ref 65–99)

## 2016-07-14 LAB — COMPREHENSIVE METABOLIC PANEL
ALBUMIN: 1.5 g/dL — AB (ref 3.5–5.0)
ALT: 40 U/L (ref 14–54)
ANION GAP: 10 (ref 5–15)
AST: 38 U/L (ref 15–41)
Alkaline Phosphatase: 177 U/L — ABNORMAL HIGH (ref 38–126)
BUN: 23 mg/dL — AB (ref 6–20)
CHLORIDE: 99 mmol/L — AB (ref 101–111)
CO2: 27 mmol/L (ref 22–32)
Calcium: 8.3 mg/dL — ABNORMAL LOW (ref 8.9–10.3)
Creatinine, Ser: 2.31 mg/dL — ABNORMAL HIGH (ref 0.44–1.00)
GFR calc Af Amer: 25 mL/min — ABNORMAL LOW (ref >60)
GFR, EST NON AFRICAN AMERICAN: 21 mL/min — AB (ref >60)
GLUCOSE: 169 mg/dL — AB (ref 65–99)
POTASSIUM: 3.3 mmol/L — AB (ref 3.5–5.1)
Sodium: 136 mmol/L (ref 135–145)
Total Bilirubin: 0.5 mg/dL (ref 0.3–1.2)
Total Protein: 5.7 g/dL — ABNORMAL LOW (ref 6.5–8.1)

## 2016-07-14 LAB — CBC WITH DIFFERENTIAL/PLATELET
Basophils Absolute: 0 10*3/uL (ref 0.0–0.1)
Basophils Relative: 0 %
EOS ABS: 0 10*3/uL (ref 0.0–0.7)
Eosinophils Relative: 0 %
HCT: 23.6 % — ABNORMAL LOW (ref 36.0–46.0)
Hemoglobin: 7.6 g/dL — ABNORMAL LOW (ref 12.0–15.0)
Lymphocytes Relative: 5 %
Lymphs Abs: 1.5 10*3/uL (ref 0.7–4.0)
MCH: 28.7 pg (ref 26.0–34.0)
MCHC: 32.2 g/dL (ref 30.0–36.0)
MCV: 89.1 fL (ref 78.0–100.0)
MONO ABS: 1.2 10*3/uL — AB (ref 0.1–1.0)
Monocytes Relative: 4 %
NEUTROS PCT: 91 %
Neutro Abs: 26.5 10*3/uL — ABNORMAL HIGH (ref 1.7–7.7)
PLATELETS: 392 10*3/uL (ref 150–400)
RBC: 2.65 MIL/uL — AB (ref 3.87–5.11)
RDW: 16.3 % — ABNORMAL HIGH (ref 11.5–15.5)
WBC: 29.2 10*3/uL — AB (ref 4.0–10.5)

## 2016-07-14 LAB — PROCALCITONIN: PROCALCITONIN: 1.92 ng/mL

## 2016-07-14 LAB — MAGNESIUM: MAGNESIUM: 1.8 mg/dL (ref 1.7–2.4)

## 2016-07-14 SURGERY — CREATION, TRACHEOSTOMY
Anesthesia: General

## 2016-07-14 MED ORDER — PHENYLEPHRINE HCL 10 MG/ML IJ SOLN
INTRAMUSCULAR | Status: DC | PRN
  Administered 2016-07-14: 120 ug via INTRAVENOUS

## 2016-07-14 MED ORDER — LIDOCAINE-EPINEPHRINE 1 %-1:100000 IJ SOLN
INTRAMUSCULAR | Status: DC | PRN
  Administered 2016-07-14: 2 mL

## 2016-07-14 MED ORDER — ROCURONIUM BROMIDE 10 MG/ML (PF) SYRINGE
PREFILLED_SYRINGE | INTRAVENOUS | Status: AC
  Filled 2016-07-14: qty 10

## 2016-07-14 MED ORDER — METHIMAZOLE 10 MG PO TABS
10.0000 mg | ORAL_TABLET | Freq: Every day | ORAL | Status: DC
  Administered 2016-07-14 – 2016-07-18 (×5): 10 mg
  Filled 2016-07-14 (×6): qty 1

## 2016-07-14 MED ORDER — FENTANYL CITRATE (PF) 100 MCG/2ML IJ SOLN
INTRAMUSCULAR | Status: AC
  Filled 2016-07-14: qty 2

## 2016-07-14 MED ORDER — 0.9 % SODIUM CHLORIDE (POUR BTL) OPTIME
TOPICAL | Status: DC | PRN
  Administered 2016-07-14: 1000 mL

## 2016-07-14 MED ORDER — MIDAZOLAM HCL 2 MG/2ML IJ SOLN
INTRAMUSCULAR | Status: AC
  Filled 2016-07-14: qty 2

## 2016-07-14 MED ORDER — EPHEDRINE 5 MG/ML INJ
INTRAVENOUS | Status: AC
  Filled 2016-07-14: qty 20

## 2016-07-14 MED ORDER — PHENYLEPHRINE HCL 10 MG/ML IJ SOLN
INTRAVENOUS | Status: DC | PRN
  Administered 2016-07-14: 50 ug/min via INTRAVENOUS

## 2016-07-14 MED ORDER — MIDAZOLAM HCL 2 MG/2ML IJ SOLN
INTRAMUSCULAR | Status: DC | PRN
  Administered 2016-07-14: 2 mg via INTRAVENOUS

## 2016-07-14 MED ORDER — FENTANYL CITRATE (PF) 100 MCG/2ML IJ SOLN
INTRAMUSCULAR | Status: DC | PRN
  Administered 2016-07-14 (×2): 50 ug via INTRAVENOUS

## 2016-07-14 MED ORDER — GLYCOPYRROLATE 0.2 MG/ML IV SOSY
PREFILLED_SYRINGE | INTRAVENOUS | Status: AC
  Filled 2016-07-14: qty 9

## 2016-07-14 MED ORDER — POTASSIUM CHLORIDE 20 MEQ/15ML (10%) PO SOLN
20.0000 meq | Freq: Every day | ORAL | Status: DC
  Administered 2016-07-14 – 2016-07-18 (×5): 20 meq
  Filled 2016-07-14 (×5): qty 15

## 2016-07-14 MED ORDER — ONDANSETRON HCL 4 MG/2ML IJ SOLN
INTRAMUSCULAR | Status: DC | PRN
  Administered 2016-07-14: 4 mg via INTRAVENOUS

## 2016-07-14 MED ORDER — ROCURONIUM 10MG/ML (10ML) SYRINGE FOR MEDFUSION PUMP - OPTIME
INTRAVENOUS | Status: DC | PRN
  Administered 2016-07-14: 20 mg via INTRAVENOUS
  Administered 2016-07-14: 30 mg via INTRAVENOUS

## 2016-07-14 MED ORDER — MAGNESIUM SULFATE IN D5W 1-5 GM/100ML-% IV SOLN
1.0000 g | Freq: Once | INTRAVENOUS | Status: AC
  Administered 2016-07-14: 1 g via INTRAVENOUS
  Filled 2016-07-14: qty 100

## 2016-07-14 MED ORDER — VANCOMYCIN 50 MG/ML ORAL SOLUTION
500.0000 mg | Freq: Four times a day (QID) | ORAL | Status: AC
  Administered 2016-07-14 – 2016-07-18 (×15): 500 mg via ORAL
  Filled 2016-07-14 (×16): qty 10

## 2016-07-14 MED ORDER — SODIUM CHLORIDE 0.9 % IV SOLN
INTRAVENOUS | Status: DC | PRN
  Administered 2016-07-14: 10:00:00 via INTRAVENOUS

## 2016-07-14 MED ORDER — PHENYLEPHRINE 40 MCG/ML (10ML) SYRINGE FOR IV PUSH (FOR BLOOD PRESSURE SUPPORT)
PREFILLED_SYRINGE | INTRAVENOUS | Status: AC
  Filled 2016-07-14: qty 20

## 2016-07-14 MED ORDER — ONDANSETRON HCL 4 MG/2ML IJ SOLN
INTRAMUSCULAR | Status: AC
  Filled 2016-07-14: qty 2

## 2016-07-14 MED ORDER — PROPOFOL 10 MG/ML IV BOLUS
INTRAVENOUS | Status: DC | PRN
  Administered 2016-07-14: 60 mg via INTRAVENOUS

## 2016-07-14 MED ORDER — POTASSIUM CHLORIDE 20 MEQ/15ML (10%) PO SOLN: 20.0000 meq | Freq: Every day | ORAL | Status: DC

## 2016-07-14 MED ORDER — LIDOCAINE-EPINEPHRINE 1 %-1:100000 IJ SOLN
INTRAMUSCULAR | Status: AC
  Filled 2016-07-14: qty 1

## 2016-07-14 SURGICAL SUPPLY — 33 items
CANISTER SUCTION 2500CC (MISCELLANEOUS) ×3 IMPLANT
CLEANER TIP ELECTROSURG 2X2 (MISCELLANEOUS) ×3 IMPLANT
CORDS BIPOLAR (ELECTRODE) ×3 IMPLANT
COVER SURGICAL LIGHT HANDLE (MISCELLANEOUS) ×3 IMPLANT
CRADLE DONUT ADULT HEAD (MISCELLANEOUS) ×3 IMPLANT
ELECT COATED BLADE 2.86 ST (ELECTRODE) ×3 IMPLANT
ELECT REM PT RETURN 9FT ADLT (ELECTROSURGICAL) ×3
ELECTRODE REM PT RTRN 9FT ADLT (ELECTROSURGICAL) ×1 IMPLANT
FORCEPS BIPOLAR SPETZLER 8 1.0 (NEUROSURGERY SUPPLIES) ×3 IMPLANT
GAUZE SPONGE 4X4 16PLY XRAY LF (GAUZE/BANDAGES/DRESSINGS) ×3 IMPLANT
GAUZE VASELINE FOILPK 1/2 X 72 (GAUZE/BANDAGES/DRESSINGS) ×3 IMPLANT
GLOVE BIO SURGEON STRL SZ7.5 (GLOVE) ×3 IMPLANT
GOWN STRL REUS W/ TWL LRG LVL3 (GOWN DISPOSABLE) ×3 IMPLANT
GOWN STRL REUS W/TWL LRG LVL3 (GOWN DISPOSABLE) ×6
HOLDER TRACH TUBE VELCRO 19.5 (MISCELLANEOUS) ×3 IMPLANT
KIT BASIN OR (CUSTOM PROCEDURE TRAY) ×3 IMPLANT
KIT ROOM TURNOVER OR (KITS) ×3 IMPLANT
KIT SUCTION CATH 14FR (SUCTIONS) ×3 IMPLANT
NEEDLE HYPO 25GX1X1/2 BEV (NEEDLE) ×3 IMPLANT
NS IRRIG 1000ML POUR BTL (IV SOLUTION) ×3 IMPLANT
PACK EENT II TURBAN DRAPE (CUSTOM PROCEDURE TRAY) ×3 IMPLANT
PAD ARMBOARD 7.5X6 YLW CONV (MISCELLANEOUS) ×3 IMPLANT
PENCIL BUTTON HOLSTER BLD 10FT (ELECTRODE) ×3 IMPLANT
SPONGE DRAIN TRACH 4X4 STRL 2S (GAUZE/BANDAGES/DRESSINGS) ×3 IMPLANT
SUT SILK 0 FSL (SUTURE) ×3 IMPLANT
SUT SILK 2 0 REEL (SUTURE) ×3 IMPLANT
SUT SILK 2 0 SH CR/8 (SUTURE) ×3 IMPLANT
SYR 20ML ECCENTRIC (SYRINGE) ×3 IMPLANT
SYR BULB 3OZ (MISCELLANEOUS) ×3 IMPLANT
SYR CONTROL 10ML LL (SYRINGE) ×3 IMPLANT
TOWEL OR 17X24 6PK STRL BLUE (TOWEL DISPOSABLE) ×3 IMPLANT
TUBE CONNECTING 12'X1/4 (SUCTIONS) ×1
TUBE CONNECTING 12X1/4 (SUCTIONS) ×2 IMPLANT

## 2016-07-14 NOTE — Anesthesia Postprocedure Evaluation (Signed)
Anesthesia Post Note  Patient: Angelica Beck  Procedure(s) Performed: Procedure(s) (LRB): TRACHEOSTOMY, THYROID ISTHMUSECTOMY (N/A)  Patient location during evaluation: ICU Anesthesia Type: General Level of consciousness: sedated Pain management: pain level controlled Vital Signs Assessment: post-procedure vital signs reviewed and stable Respiratory status: patient connected to tracheostomy mask oxygen Cardiovascular status: stable Postop Assessment: no signs of nausea or vomiting Anesthetic complications: no    Last Vitals:  Vitals:   07/14/16 1300 07/14/16 1400  BP: (!) 161/72 (!) 142/59  Pulse: 91 82  Resp: 19 13  Temp:      Last Pain:  Vitals:   07/14/16 0754  TempSrc: Oral  PainSc:                  Clyde Upshaw

## 2016-07-14 NOTE — Care Management Note (Signed)
Case Management Note  Patient Details  Name: Angelica Beck MRN: [id number] Date of Birth: 05-14-1951  Subjective/Objective:   Pt admitted with  - pt is now on ventilator   Action/Plan:   She lives at home with her daughter where she requires full assistance, she is unable to truly ambulate and spends most of her time in a hospital bed/recliner. She relies on family members almost entirely for activities of daily living.  Pt may benefit from PT/OT eval for possible placement if family is unable to resume care at home.  CM will continue to follow pt for discharge needs   Expected Discharge Date:                  Expected Discharge Plan:  Long Term Acute Care (LTAC)  In-House Referral:  NA  Discharge planning Services  CM Consult  Post Acute Care Choice:    Choice offered to:     DME Arranged:    DME Agency:     HH Arranged:    HH Agency:     Status of Service:  In process, will continue to follow  If discussed at Long Length of Stay Meetings, dates discussed:    Additional Comments: 07/14/2016  Pt trached today.  Perm HD catheter placed.  [facility] informed of progress  10.17.17 Pt reintubated yesterday.  Plan is to have trach in OR today and then perm catheter placed this week prior to discharge to [facility] per attending   07/06/16  Discussed pt in LOS 07/06/16:  Pt remains appropriate for continued stay.  Discussed pt with attending ; pt is not ready for discharge to [facility], [facility], [facility] at this time.  Plan is for possible extubation today post IHD - if pt fails extubation pt will receive trach - daughter is aware  07/05/16 Pts daughter met/toured both facilities yesterday.  CM contacted daughter this am - collective decision has been made to discharge to [facility] when medically ready.  Both facilities have been made aware of choice.  Pt remains on ventilator - with slow improvement of mental status  07/04/16 CM covering unit 10/9 - spoke in detail with Physician Advisor and it was determined  that pt is appropriate for LTACH.  CM spoke with attending in detail today and he also agrees with [facility] referral - attending has already been in discussions with pt and daughter regarding post acute facility with current needs (vent/trach and HD).  Referral given to both LTACHs 07/03/16 - both [facility] and [facility] have offered beds.  CM contacted daughter [name] and informed her or the discharge recommendation and choice between [facility] and [facility]- daughter is in agreement with possible discharge to [facility] and informal meeting with both options today ([facility] at [employer] and [facility] at [employer]) at pts bedside.  Pt is currently intubated with plans for tentative extubation/trach - still requiring IHD, needing perm HD cath placed however WBC trending up and concern for C Diff.   CM will continue to monitor for discharge needs Maryclare Labrador, RN 07/14/2016, 4:27 PM

## 2016-07-14 NOTE — Progress Notes (Signed)
Patient to OR via bed for trach . Pt's daughter called

## 2016-07-14 NOTE — Progress Notes (Signed)
PULMONARY / CRITICAL CARE MEDICINE   Name: Angelica Beck MRN: [id number] DOB: October 07, 1950    ADMISSION DATE:  06/20/2016 CONSULTATION DATE:  06/20/2016  REFERRING MD:  EDP Dr Cathleen Fears   CHIEF COMPLAINT:  AMS  HISTORY OF PRESENT ILLNESS:   65 year old female with past medical history as below, which is significant for stroke with subsequent debilitation, diabetes, chronic kidney disease, chronic urinary tract infections due to incontinence, for which she takes chronic antibiotics. She lives at home with her daughter where she requires full assistance she is unable to truly ambulate and spends most of her time in a hospital bed/recliner. She relies on family members almost entirely for activities of daily living. She was in her usual state of health until about 9/23 when she developed nausea and vomiting. This persisted for 2 days until symptoms subsided, however, she continued to complain of abdominal pain. 9/26 when the patient's daughter got home from work the patient was found to be unresponsive. EMS was called and she was found hypoglycemic which was corrected, however, altered mental status did not improve. Upon arrival to the emergency department she continued to be lethargic and was found to be hypotensive with hypoxemia. Laboratory evaluation significant for potassium 7.4, bicarbonate less than 7, creatinine 6.51, BUN 95, WBC 12.3, lactic acid 13.88, pH 7.07.  SUBJECTIVE:  Reintubated 10/16. Patient nods no to any chest pain or difficulty breathing at this time. Nods no to any nausea or belly pain. Has been having more secretions.  REVIEW OF SYSTEMS:  Denies pain. Denies trouble breathing.   VITAL SIGNS: BP 136/71   Pulse (!) 110   Temp 99.3 F (37.4 C) (Oral)   Resp (!) 23   Ht '5\' 6"'$  (1.676 m)   Wt 220 lb 4.8 oz (99.9 kg)   SpO2 100%   BMI 35.56 kg/m   HEMODYNAMICS:    VENTILATOR SETTINGS: Vent Mode: PRVC FiO2 (%):  [40 %] 40 % Set Rate:  [15 bmp] 15 bmp Vt Set:  [470  mL] 470 mL PEEP:  [5 cmH20] 5 cmH20 Pressure Support:  [10 cmH20] 10 cmH20 Plateau Pressure:  [15 cmH20-27 cmH20] 18 cmH20  INTAKE / OUTPUT: I/O last 3 completed shifts: In: 570 [NG/GT:570] Out: 2500 [Other:2500]  PHYSICAL EXAMINATION: General:  Obese female. ETT in place. No family at bedside. Neuro: Nods appropriately. Tracks to voice. Following commands. Hand grip strength increased to 4+/5-/5 on R.  HEENT:  No scleral icterus. Goiter noted. Moving neck well.  Cardiovascular:  Regular rhythm. Ansarca persists but is improving. 1+ PE to knees bilaterally Pulmonary: Normal work of breathing. Good aeration bilaterally.  Abdomen:  Soft. Protuberant. Normoactive bowel sounds. Musculoskeletal:  No joint deformity or effusion. Lifting head off of bed. Skin:  Warm & dry. No rash on exposed skin.  LABS:  BMET  Recent Labs Lab 07/12/16 0506 07/13/16 0652 07/14/16 0430  NA 135 135 136  K 3.0* 4.0 3.3*  CL 99* 102 99*  CO2 '25 24 27  '$ BUN 58* 66* 23*  CREATININE 3.32* 3.98* 2.31*  GLUCOSE 92 119* 169*    Electrolytes  Recent Labs Lab 07/11/16 0400 07/12/16 0506 07/13/16 0406 07/13/16 0652 07/14/16 0430  CALCIUM 8.6* 8.6*  --  8.3* 8.3*  MG 2.0 1.8 1.9  --  1.8  PHOS 7.5* 4.6  --  5.3*  --     CBC  Recent Labs Lab 07/12/16 0506 07/13/16 0406 07/14/16 0430  WBC 20.1* 20.6* 29.2*  HGB 7.8* 7.5* 7.6*  HCT  23.7* 23.3* 23.6*  PLT 373 377 392    Coag's  Recent Labs Lab 07/10/16 1300 07/11/16 0400  APTT  --  30  INR 1.16 1.32    Sepsis Markers No results for input(s): LATICACIDVEN, PROCALCITON, O2SATVEN in the last 168 hours.  ABG  Recent Labs Lab 07/10/16 1229 07/10/16 1432 07/11/16 0817  PHART 7.324* 7.391 7.358  PCO2ART 38.6 33.4 36.5  PO2ART 121* 240.0* 119*    Liver Enzymes  Recent Labs Lab 07/12/16 0506 07/13/16 0652 07/14/16 0430  AST 51*  --  38  ALT 50  --  40  ALKPHOS 228*  --  177*  BILITOT 0.3  --  0.5  ALBUMIN 1.5* 1.6*  1.5*    Cardiac Enzymes No results for input(s): TROPONINI, PROBNP in the last 168 hours.  Glucose  Recent Labs Lab 07/13/16 0347 07/13/16 0756 07/13/16 1130 07/13/16 1552 07/13/16 2353 07/14/16 0353  GLUCAP 123* 106* 135* 126* 171* 172*    Imaging No results found.  STUDIES:  CT Head 9/27:  No acute abnormality. Chronic small vessel ischemic changes. Chronic lacunar infarct left basal ganglia & chronic ischemia right side of pons.  CT Abd/Pelvis w/o 9/27:  Early partial SBO w/ transition point LLQ with adhesions. Left humerus soft tissue swelling & intramuscular gas. Punctate hepatic granulomas noted.  Left Arm X-ray 9/27:  Diffuse soft tissue edema.  TTE 9/27:  LV w/ EF 60-65%. Grade 1 Diastolic dysfunction. No regional wall motion abnormalities. LA & RA normal in size. RV normal in size & function. Mild AS w/o AR. Mild MR w/o MS.  ABIs 10/3: R normal. L posterior artery noncompressible; suspect calcification.  CT head 10/3: No acute infarct, no hemorrhage or mass. Atrophy evident. Similar to prior study.   MICROBIOLOGY: MRSA PCR 9/27:  Negative Blood Ctx x 2 9/26: Coag Neg Staph 1/2 Urine Ctx 9/26:  5000 CFU E coli & 1000 CFU Klebsiella  C diff PCR 10/10:  Toxin & Antigen Positive  ANTIBIOTICS: Levaquin 9/26 - 9/28 Aztreonam 9/26 - 9/27 Vancomycin 9/26 - off Merrem 9/27 - 10/3 PO Vancomycin 10/10 >>\ Flagyl IV 10/15 (while NGT is out) > 10/16  SIGNIFICANT EVENTS: 9/26 - admit for presumed septic shock 10/14 - extubated 10/16 - reintubated 10/18 - HD perm cath placed  LINES/TUBES: OETT 7.5 9/26 - 10/14 OETT 10/16 >> OGT 9/26 - 10/14 OGT 10/16 >> L IJ CVL 9/26 >> Foley 9/26 >> 10/8 RIJ HD 9/30 >> 10/18 Rectal pouch 10/17 > 10/18 NG tube 10/17 >> R HD Perm Cath 10/18 >>  DISCUSSION: 65 y.o. female with T2DM, CKD, recurrent UTIs 2/2 incontinence, and stroke with subsequent severe debilitation (Left sided weakness; ECOG 4), who presented with AMS and found  to be in shock, likely 2/2 UTI sepsis. Some improvement in level of alertness with CRRT. Completed 7-day course of meropenem 10/3. Extubated 10/14 but requiring intermittent noninvasive positive pressure ventilation. Also being treated for C diff, on day 9 of PO vancomycin. Plan for tracheostomy by ENT 10/20. Plan for G-tube placement by IR 10/23.   ASSESSMENT / PLAN:  PULMONARY A: Acute Hypoxic Respiratory Failure - Reintubated due to inability to protect airway with severe deconditioning and weakness. Positive almost 1.8 L since 10/6.  [facility]  P:   Planning for tracheostomy 10/20 by ENT Obtain daily CXR Nastotracheal suctioning Incentive Spirometry  Weaning FiO2 for sat >92%  CARDIOVASCULAR A:  Shock - Likely sepsis. Cortisol 45.1. Resolved. H/O HTN & HLD  P:  Vitals per unit protocol Continuous telemetry monitoring Diuresis with HD TThSa  RENAL A:   Acute On Chronic Renal Failure Stage IV  - worsens without HD treatments Profound Metabolic Acidosis - Resolved w/ HD. Hyperkalemia - Resolved.  Lactic Acidosis - Resolved. Likely due to hypoperfusion in setting of Metformin.   Hyperphosphatemia - Secondary to acute renal failure.  Pseudohypocalcemia - Resolved.   P:   Appreciate Nephrology recommendations Dialysis TTS Trending electrolytes daily Replacing electrolytes as indicated Perm-a-cath placed 10/18, with previous HD cath removed  GASTROINTESTINAL A:   Partial SBO - Resolving on XR abd 9/30. Having BMs. H/O Ventral Hernia Repair. Possible Liver Cirrhosis. RUQ U/S normal. Ammonia WNL 10/5.  Transaminitis - Likely shock liver versus congestive hepatopathy. Improving.  Diarrhea - Secondary to C diff.   P:   NGT placed Hold tube feeds at midnight for tracheostomy tomorrow, 10/20 Pepcid VT daily once replaced Trending LFTs Intermittently  Consulted IR about possible G-tube placement   HEMATOLOGIC A:   Anemia - Chronic. No signs of active bleeding. S/P 1u on  10/6 & 1u on 10/11. Hgb relatively stable. Leukocytosis - Likely sepsis. Resolving. 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 Unlikely with neg SRA. Thrombocytopenia - Resolved.  P:  Trending cell counts daily w/ CBC Transfusion goal of 7.0 SCDs Heparin [place] q8hr Aspirin 162 mg  INFECTIOUS A:   Severe Sepsis  C Diff Colitis - Found 10/10 & started PO Vancocin. E.coli and Klebsiella UTI - S/P 7 days Merrem. Sacral wound  P:   PO/OGT Vancomycin Day #9 (Day #10 abx, received flagyl when temporarily lost NG access) Holding home Trimethoprim. Wound care consulted Plan to re-culture for fever   ENDOCRINE A:   Hypoglycemia - Resolved with Dextrose IVF. H/O DM Type 2 - A1c 5.8. H/O Hyperthyroidism - On PTU at home. TSH 0.331 & F T4 1.33 9/26. Repeat TSH 0.458 & FT4 1.78 on 10/14. TSH 0.286 10/18. Free T4 1.90 P:   SSI per Sensitive Algorithm. Decrease levemir to 10 U daily from BID; hold when NPO 10/20 Holding PTU & Metformin. Increase methimazole from '5mg'$  to 10 mg VT daily Repeat TSH, free T4 next week  NEUROLOGIC A:   Acute Encephalopathy - Question hepatic vs toxic metabolic vs hypoxia.  Repeat CT head unchanged. Folate WNL. H/O CVA - Minimal mobility.  P:   Avoid sedating meds  MUSCULOSKELETAL A: Edema Left Arm - Improved. Secondary to IV infiltration. No gas on X-ray 9/27.  P: Continue to monitor   FAMILY  - Updates: Spoke with daughter 10/19 about continued plan for tracheostomy 10/20 and likely g-tube placement 10/23.   - Inter-disciplinary family meet or Palliative Care meeting due by: Continued Agency discussion 10/6 with daughter [name]. Wants aggressive treatment.   Olene Floss, MD [place], PGY-2

## 2016-07-14 NOTE — Brief Op Note (Signed)
06/20/2016 - 07/14/2016  11:10 AM  PATIENT:  Angelica Beck  65 y.o. female  PRE-OPERATIVE DIAGNOSIS:  THYROID GOITER AND RESPIRATORY FAILURE  POST-OPERATIVE DIAGNOSIS:  THYROID GOITER AND RESPIRATORY FAILURE  PROCEDURE:  Procedure(s): TRACHEOSTOMY, THYROID ISTHMUSECTOMY (N/A)  SURGEON:  Surgeon(s) and Role:    * [name], MD - Primary  PHYSICIAN ASSISTANT:   ASSISTANTS: none   ANESTHESIA:   general  EBL:  Total I/O In: 100 [IV Piggyback:100] Out: -   BLOOD ADMINISTERED:none  DRAINS: none   LOCAL MEDICATIONS USED:  LIDOCAINE   SPECIMEN:  Source of Specimen:  thyroid isthmus  DISPOSITION OF SPECIMEN:  PATHOLOGY  COUNTS:  YES  TOURNIQUET:  * No tourniquets in log *  DICTATION: .Other Dictation: Dictation Number [id number]  PLAN OF CARE: Return to ICU  PATIENT DISPOSITION:  ICU - intubated and hemodynamically stable.   Delay start of Pharmacological VTE agent (>24hrs) due to surgical blood loss or risk of bleeding: no

## 2016-07-14 NOTE — Anesthesia Preprocedure Evaluation (Signed)
Anesthesia Evaluation  Patient identified by MRN, date of birth, ID band Patient awake    Reviewed: Allergy & Precautions, NPO status , Patient's Chart, lab work & pertinent test results  History of Anesthesia Complications Negative for: history of anesthetic complications  Airway Mallampati: Intubated       Dental  (+) Dental Advisory Given   Pulmonary former smoker,    + rhonchi        Cardiovascular hypertension, Pt. on home beta blockers and Pt. on medications + CAD   Rhythm:Regular     Neuro/Psych PSYCHIATRIC DISORDERS CVA    GI/Hepatic negative GI ROS, Neg liver ROS,   Endo/Other  diabetes, Type 2Morbid obesity  Renal/GU Dialysis and ESRFRenal disease     Musculoskeletal   Abdominal   Peds  Hematology  (+) anemia ,   Anesthesia Other Findings   Reproductive/Obstetrics                             Anesthesia Physical Anesthesia Plan  ASA: III  Anesthesia Plan: General   Post-op Pain Management:    Induction: Inhalational  Airway Management Planned: Oral ETT  Additional Equipment: None  Intra-op Plan:   Post-operative Plan: Post-operative intubation/ventilation  Informed Consent: I have reviewed the patients History and Physical, chart, labs and discussed the procedure including the risks, benefits and alternatives for the proposed anesthesia with the patient or authorized representative who has indicated his/her understanding and acceptance.   Dental advisory given  Plan Discussed with: CRNA and Surgeon  Anesthesia Plan Comments:         Anesthesia Quick Evaluation

## 2016-07-14 NOTE — Progress Notes (Signed)
[facility] ROUNDING NOTE   Subjective:   Interval History:  Tracheostomy planned today   Objective:  Vital signs in last 24 hours:  Temp:  [98 F (36.7 C)-100.5 F (38.1 C)] 98.8 F (37.1 C) (10/20 0754) Pulse Rate:  [91-122] 108 (10/20 0900) Resp:  [15-28] 17 (10/20 0900) BP: (101-138)/(60-89) 129/62 (10/20 0900) SpO2:  [97 %-100 %] 100 % (10/20 0900) FiO2 (%):  [40 %] 40 % (10/20 0831) Weight:  [99.8 kg (220 lb 0.3 oz)-99.9 kg (220 lb 4.8 oz)] 99.9 kg (220 lb 4.8 oz) (10/20 0500)  Weight change: 0 kg (0 lb) Filed Weights   07/13/16 1030 07/13/16 1447 07/14/16 0500  Weight: 102.3 kg (225 lb 8.5 oz) 99.8 kg (220 lb 0.3 oz) 99.9 kg (220 lb 4.8 oz)    Intake/Output: I/O last 3 completed shifts: In: 570 [NG/GT:570] Out: 2500 [Other:2500]   Intake/Output this shift:  Total I/O In: 100 [IV Piggyback:100] Out: -   General: Obese female. ETT in place. No family at bedside. Neuro: Nods appropriately. Tracks to voice. Following commands. HEENT: No scleral icterus.  ETT and NGT  Cardiovascular: Regular rhythm. Ansarca persists but is improving. Right HD catheter Pulmonary:  Rhoncherous sounds Abdomen: Soft. Protuberant. Normoactive bowel sounds. Musculoskeletal: No joint deformity or effusion. Lifting head off of bed. Skin: Warm &dry. No rash on exposed skin   Basic Metabolic Panel:  Recent Labs Lab 07/09/16 0500 07/10/16 0540 07/11/16 0400 07/12/16 0506 07/13/16 0406 07/13/16 0652 07/14/16 0430  NA 135 135 138 135  --  135 136  K 3.9 4.1 3.9 3.0*  --  4.0 3.3*  CL 100* 99* 102 99*  --  102 99*  CO2 24 21* 22 25  --  24 27  GLUCOSE 125* 138* 219* 92  --  119* 169*  BUN 63* 82* 106* 58*  --  66* 23*  CREATININE 2.82* 3.72* 4.38* 3.32*  --  3.98* 2.31*  CALCIUM 8.8* 9.1 8.6* 8.6*  --  8.3* 8.3*  MG 1.8 1.9 2.0 1.8 1.9  --  1.8  PHOS 4.3 6.8* 7.5* 4.6  --  5.3*  --     Liver Function Tests:  Recent Labs Lab 07/10/16 0540  07/11/16 0400 07/12/16 0506 07/13/16 0652 07/14/16 0430  AST  --   --  51*  --  38  ALT  --   --  50  --  40  ALKPHOS  --   --  228*  --  177*  BILITOT  --   --  0.3  --  0.5  PROT  --   --  5.7*  --  5.7*  ALBUMIN 1.5* 1.5* 1.5* 1.6* 1.5*   No results for input(s): LIPASE, AMYLASE in the last 168 hours. No results for input(s): AMMONIA in the last 168 hours.  CBC:  Recent Labs Lab 07/10/16 0540 07/11/16 0400 07/11/16 0930 07/12/16 0506 07/13/16 0406 07/14/16 0430  WBC 13.7* 15.8* 22.2* 20.1* 20.6* 29.2*  NEUTROABS 11.2* 13.9*  --  17.9* 18.4* 26.5*  HGB 8.3* 7.8* 9.0* 7.8* 7.5* 7.6*  HCT 25.5* 24.1* 27.1* 23.7* 23.3* 23.6*  MCV 87.0 86.1 86.3 85.9 86.9 89.1  PLT 366 378 459* 373 377 392    Cardiac Enzymes: No results for input(s): CKTOTAL, CKMB, CKMBINDEX, TROPONINI in the last 168 hours.  BNP: Invalid input(s): POCBNP  CBG:  Recent Labs Lab 07/13/16 1130 07/13/16 1552 07/13/16 2353 07/14/16 0353 07/14/16 0753  GLUCAP 135* 126* 171* 172* 147*  Microbiology: Results for orders placed or performed during the hospital encounter of 06/20/16  Blood Culture (routine x 2)     Status: None   Collection Time: 06/20/16 10:25 PM  Result Value Ref Range Status   Specimen Description BLOOD RIGHT HAND  Final   Special Requests IN PEDIATRIC BOTTLE 1ML  Final   Culture NO GROWTH 5 DAYS  Final   Report Status 06/25/2016 FINAL  Final  Blood Culture (routine x 2)     Status: Abnormal   Collection Time: 06/20/16 10:29 PM  Result Value Ref Range Status   Specimen Description BLOOD LEFT ARM  Final   Special Requests BOTTLES DRAWN AEROBIC AND ANAEROBIC 5ML  Final   Culture  Setup Time   Final    GRAM POSITIVE COCCI IN CLUSTERS IN BOTH AEROBIC AND ANAEROBIC BOTTLES CRITICAL RESULT CALLED TO, READ BACK BY AND VERIFIED WITH: T. DANG, [place] ON 06/21/16 BY C. JESSUP, MLT.    Culture (A)  Final    STAPHYLOCOCCUS SPECIES (COAGULASE NEGATIVE) THE SIGNIFICANCE OF  ISOLATING THIS ORGANISM FROM A SINGLE SET OF BLOOD CULTURES WHEN MULTIPLE SETS ARE DRAWN IS UNCERTAIN. PLEASE NOTIFY THE MICROBIOLOGY DEPARTMENT WITHIN ONE WEEK IF SPECIATION AND SENSITIVITIES ARE REQUIRED.    Report Status 06/23/2016 FINAL  Final  Urine culture     Status: Abnormal   Collection Time: 06/20/16 10:29 PM  Result Value Ref Range Status   Specimen Description IN/OUT CATH URINE  Final   Special Requests NONE  Final   Culture (A)  Final    5,000 COLONIES/mL ESCHERICHIA COLI 1,000 COLONIES/mL KLEBSIELLA PNEUMONIAE    Report Status 06/23/2016 FINAL  Final   Organism ID, Bacteria ESCHERICHIA COLI (A)  Final   Organism ID, Bacteria KLEBSIELLA PNEUMONIAE (A)  Final      Susceptibility   Escherichia coli - MIC*    AMPICILLIN >=32 RESISTANT Resistant     CEFAZOLIN <=4 SENSITIVE Sensitive     CEFTRIAXONE <=1 SENSITIVE Sensitive     CIPROFLOXACIN 1 SENSITIVE Sensitive     GENTAMICIN <=1 SENSITIVE Sensitive     IMIPENEM <=0.25 SENSITIVE Sensitive     NITROFURANTOIN <=16 SENSITIVE Sensitive     TRIMETH/SULFA >=320 RESISTANT Resistant     AMPICILLIN/SULBACTAM 16 INTERMEDIATE Intermediate     PIP/TAZO <=4 SENSITIVE Sensitive     Extended ESBL NEGATIVE Sensitive     * 5,000 COLONIES/mL ESCHERICHIA COLI   Klebsiella pneumoniae - MIC*    AMPICILLIN >=32 RESISTANT Resistant     CEFAZOLIN <=4 SENSITIVE Sensitive     CEFTRIAXONE <=1 SENSITIVE Sensitive     CIPROFLOXACIN <=0.25 SENSITIVE Sensitive     GENTAMICIN <=1 SENSITIVE Sensitive     IMIPENEM <=0.25 SENSITIVE Sensitive     NITROFURANTOIN <=16 SENSITIVE Sensitive     TRIMETH/SULFA <=20 SENSITIVE Sensitive     AMPICILLIN/SULBACTAM 4 SENSITIVE Sensitive     PIP/TAZO <=4 SENSITIVE Sensitive     Extended ESBL NEGATIVE Sensitive     * 1,000 COLONIES/mL KLEBSIELLA PNEUMONIAE  Gram stain     Status: None   Collection Time: 06/20/16 10:29 PM  Result Value Ref Range Status   Specimen Description IN/OUT CATH URINE  Final   Special  Requests NONE  Final   Gram Stain   Final    CYTOSPIN SMEAR SQUAMOUS EPITHELIAL CELLS PRESENT WBC PRESENT, PREDOMINANTLY MONONUCLEAR GRAM VARIABLE ROD    Report Status 06/20/2016 FINAL  Final  Blood Culture ID Panel (Reflexed)     Status: Abnormal  Collection Time: 06/20/16 10:29 PM  Result Value Ref Range Status   Enterococcus species NOT DETECTED NOT DETECTED Final   Listeria monocytogenes NOT DETECTED NOT DETECTED Final   Staphylococcus species DETECTED (A) NOT DETECTED Final    Comment: CRITICAL RESULT CALLED TO, READ BACK BY AND VERIFIED WITH: T. DANG, [place] ON 06/21/16 BY C. JESSUP, MLT.    Staphylococcus aureus NOT DETECTED NOT DETECTED Final   Methicillin resistance NOT DETECTED NOT DETECTED Final   Streptococcus species NOT DETECTED NOT DETECTED Final   Streptococcus agalactiae NOT DETECTED NOT DETECTED Final   Streptococcus pneumoniae NOT DETECTED NOT DETECTED Final   Streptococcus pyogenes NOT DETECTED NOT DETECTED Final   Acinetobacter baumannii NOT DETECTED NOT DETECTED Final   Enterobacteriaceae species NOT DETECTED NOT DETECTED Final   Enterobacter cloacae complex NOT DETECTED NOT DETECTED Final   Escherichia coli NOT DETECTED NOT DETECTED Final   Klebsiella oxytoca NOT DETECTED NOT DETECTED Final   Klebsiella pneumoniae NOT DETECTED NOT DETECTED Final   Proteus species NOT DETECTED NOT DETECTED Final   Serratia marcescens NOT DETECTED NOT DETECTED Final   Haemophilus influenzae NOT DETECTED NOT DETECTED Final   Neisseria meningitidis NOT DETECTED NOT DETECTED Final   Pseudomonas aeruginosa NOT DETECTED NOT DETECTED Final   Candida albicans NOT DETECTED NOT DETECTED Final   Candida glabrata NOT DETECTED NOT DETECTED Final   Candida krusei NOT DETECTED NOT DETECTED Final   Candida parapsilosis NOT DETECTED NOT DETECTED Final   Candida tropicalis NOT DETECTED NOT DETECTED Final  MRSA PCR Screening     Status: None   Collection Time: 06/21/16 12:53 AM   Result Value Ref Range Status   MRSA by PCR NEGATIVE NEGATIVE Final    Comment:        The GeneXpert MRSA Assay (FDA approved for NASAL specimens only), is one component of a comprehensive MRSA colonization surveillance program. It is not intended to diagnose MRSA infection nor to guide or monitor treatment for MRSA infections.   C difficile quick scan w PCR reflex     Status: Abnormal   Collection Time: 07/04/16 10:10 AM  Result Value Ref Range Status   C Diff antigen POSITIVE (A) NEGATIVE Final   C Diff toxin POSITIVE (A) NEGATIVE Final   C Diff interpretation Toxin producing C. difficile detected.  Final    Comment: CRITICAL RESULT CALLED TO, READ BACK BY AND VERIFIED WITH: L. Flood RN 11:45 07/04/16 ([email or web address])     Coagulation Studies: No results for input(s): LABPROT, INR in the last 72 hours.  Urinalysis: No results for input(s): COLORURINE, LABSPEC, PHURINE, GLUCOSEU, HGBUR, BILIRUBINUR, KETONESUR, PROTEINUR, UROBILINOGEN, NITRITE, LEUKOCYTESUR in the last 72 hours.  Invalid input(s): APPERANCEUR    Imaging: Dg Chest Port 1 View  Result Date: 07/14/2016 CLINICAL DATA:  Intubation, hypertension, stroke, diabetes mellitus EXAM: PORTABLE CHEST 1 VIEW COMPARISON:  Portable exam 0408 hours compared to 07/13/2016 FINDINGS: Tip of endotracheal tube projects 2.3 cm above carina. Feeding tube extends into stomach. BILATERAL jugular central venous catheters with tips projecting over SVC. Normal heart size, mediastinal contours, and pulmonary vascularity. Mild atelectasis at RIGHT base. Persistent atelectasis versus consolidation LEFT lower lobe though aeration at LEFT base has slightly improved. Small layering LEFT pleural effusion. No pneumothorax. IMPRESSION: LEFT lower lobe atelectasis versus consolidation with associated LEFT pleural effusion. Improved aeration and mildly decreased LEFT pleural effusion versus previous study. Electronically Signed   By: Lavonia Dana M.D.   On:  07/14/2016 07:50   Dg  Chest Port 1 View  Result Date: 07/13/2016 CLINICAL DATA:  Shortness of breath. EXAM: PORTABLE CHEST 1 VIEW COMPARISON:  07/12/2016. FINDINGS: Endotracheal tube, left IJ line, feeding tube, right IJ dual-lumen catheter in stable position. Persistent atelectasis and consolidation left lower lobe. Cardiomegaly with pulmonary vascular prominence and bilateral interstitial prominence consistent congestive heart failure. Left pleural effusion noted. No pneumothorax. IMPRESSION: 1. Interim placement of right IJ dual-lumen catheter. Its tip is at the cavoatrial junction. Remaining lines and tubes in stable position. 2.  Persistent left lower lobe atelectasis and consolidation. 3. Cardiomegaly with pulmonary vascular prominence and interstitial prominence. Left pleural effusion noted. A component of congestive heart failure may be present. Electronically Signed   By: Marcello Moores  Register   On: 07/13/2016 07:47     Medications:     . acetylcysteine  4 mL Nebulization TID  . aspirin  162 mg Per Tube Daily  . chlorhexidine gluconate (MEDLINE KIT)  15 mL Mouth Rinse BID  . darbepoetin (ARANESP) injection - DIALYSIS  100 mcg Intravenous Q Tue-HD  . famotidine  20 mg Per Tube Daily  . feeding supplement (NEPRO CARB STEADY)  1,000 mL Oral Q24H  . [START ON 07/18/2016] heparin subcutaneous  5,000 Units Subcutaneous Q8H  . heparin subcutaneous  5,000 Units Subcutaneous Q8H  . insulin aspart  0-9 Units Subcutaneous Q4H  . mouth rinse  15 mL Mouth Rinse QID  . methimazole  10 mg Per Tube Daily  . metoprolol tartrate  25 mg Per Tube BID  . multivitamin  15 mL Oral Daily  . potassium chloride  20 mEq Per Tube Daily  . vancomycin  500 mg Oral Q6H  . [START ON 07/17/2016] vancomycin  1,000 mg Intravenous to XRAY   sodium chloride, Place/Maintain arterial line **AND** sodium chloride, sodium chloride, sodium chloride, albuterol, alteplase, heparin, lidocaine (PF), lidocaine-prilocaine,  pentafluoroprop-tetrafluoroeth  Assessment/ Plan:  Patient with history of CVA and debilitation and diabetes and chronic renal disease  Palliative care involved  CKD stage 3 baseline now with acute renal failure from ATN Minimal urine output and continues to be dialysis dependent  1. Acute Renal Failure Baseline creatinine 1.5 I would anticipate that recovery would be unlikely in ths situation she has been dialysis dependent for almost 3 weeks and started 9/30 and will need to continue dialysis on an outpatient basis     2. HTN Appears hemodynamically stable and no pressors 3. Hypoxic renal Failure Continues to be dependent on BIPAP  4. Anemia Stable Darbepoietin 100 mcg weekly Hb 7.6  5. C Diff 10/10 started on Vancomycin and Flagyl   LOS: 24 Aaleeyah Bias W '@TODAY' '@12' :00 PM

## 2016-07-14 NOTE — Transfer of Care (Signed)
Immediate Anesthesia Transfer of Care Note  Patient: Angelica Beck  Procedure(s) Performed: Procedure(s): TRACHEOSTOMY, THYROID ISTHMUSECTOMY (N/A)  Patient Location: ICU  Anesthesia Type:General  Level of Consciousness: sedated  Airway & Oxygen Therapy: Patient placed on Ventilator (see vital sign flow sheet for setting) and ventilator placed to tracheostomy. RN and RT notified of vaseline gauze that is to remain with a tag.  Post-op Assessment: Report given to RN and Post -op Vital signs reviewed and stable  Post vital signs: Reviewed and stable  Last Vitals:  Vitals:   07/14/16 0800 07/14/16 0900  BP: 117/69 129/62  Pulse: 91 (!) 108  Resp: 15 17  Temp:      Last Pain:  Vitals:   07/14/16 0754  TempSrc: Oral  PainSc:          Complications: No apparent anesthesia complications

## 2016-07-15 ENCOUNTER — Encounter (HOSPITAL_COMMUNITY): Payer: Self-pay | Admitting: Otolaryngology

## 2016-07-15 ENCOUNTER — Inpatient Hospital Stay (HOSPITAL_COMMUNITY): Payer: Medicare Other

## 2016-07-15 LAB — RENAL FUNCTION PANEL
Albumin: 1.5 g/dL — ABNORMAL LOW (ref 3.5–5.0)
Anion gap: 10 (ref 5–15)
BUN: 30 mg/dL — ABNORMAL HIGH (ref 6–20)
CO2: 26 mmol/L (ref 22–32)
Calcium: 8.1 mg/dL — ABNORMAL LOW (ref 8.9–10.3)
Chloride: 98 mmol/L — ABNORMAL LOW (ref 101–111)
Creatinine, Ser: 3.17 mg/dL — ABNORMAL HIGH (ref 0.44–1.00)
GFR calc Af Amer: 17 mL/min — ABNORMAL LOW
GFR calc non Af Amer: 15 mL/min — ABNORMAL LOW
Glucose, Bld: 198 mg/dL — ABNORMAL HIGH (ref 65–99)
Phosphorus: 4.7 mg/dL — ABNORMAL HIGH (ref 2.5–4.6)
Potassium: 3.7 mmol/L (ref 3.5–5.1)
Sodium: 134 mmol/L — ABNORMAL LOW (ref 135–145)

## 2016-07-15 LAB — GLUCOSE, CAPILLARY
GLUCOSE-CAPILLARY: 185 mg/dL — AB (ref 65–99)
GLUCOSE-CAPILLARY: 147 mg/dL — AB (ref 65–99)
GLUCOSE-CAPILLARY: 131 mg/dL — AB (ref 65–99)
GLUCOSE-CAPILLARY: 153 mg/dL — AB (ref 65–99)
Glucose-Capillary: 146 mg/dL — ABNORMAL HIGH (ref 65–99)
Glucose-Capillary: 192 mg/dL — ABNORMAL HIGH (ref 65–99)

## 2016-07-15 LAB — CBC WITH DIFFERENTIAL/PLATELET
BASOS PCT: 0 %
Basophils Absolute: 0 10*3/uL (ref 0.0–0.1)
EOS ABS: 0 10*3/uL (ref 0.0–0.7)
Eosinophils Relative: 0 %
HCT: 21 % — ABNORMAL LOW (ref 36.0–46.0)
HEMOGLOBIN: 6.7 g/dL — AB (ref 12.0–15.0)
LYMPHS PCT: 4 %
Lymphs Abs: 1.1 10*3/uL (ref 0.7–4.0)
MCH: 28.5 pg (ref 26.0–34.0)
MCHC: 31.9 g/dL (ref 30.0–36.0)
MCV: 89.4 fL (ref 78.0–100.0)
MONO ABS: 1.1 10*3/uL — AB (ref 0.1–1.0)
Monocytes Relative: 4 %
NEUTROS PCT: 92 %
Neutro Abs: 25 10*3/uL — ABNORMAL HIGH (ref 1.7–7.7)
PLATELETS: 374 10*3/uL (ref 150–400)
RBC: 2.35 MIL/uL — AB (ref 3.87–5.11)
RDW: 16.6 % — ABNORMAL HIGH (ref 11.5–15.5)
WBC: 27.2 10*3/uL — AB (ref 4.0–10.5)

## 2016-07-15 LAB — PREPARE RBC (CROSSMATCH)

## 2016-07-15 LAB — PROCALCITONIN: Procalcitonin: 1.82 ng/mL

## 2016-07-15 LAB — LACTIC ACID, PLASMA: Lactic Acid, Venous: 0.8 mmol/L (ref 0.5–1.9)

## 2016-07-15 LAB — MAGNESIUM: Magnesium: 2.1 mg/dL (ref 1.7–2.4)

## 2016-07-15 MED ORDER — LIDOCAINE HCL (PF) 1 % IJ SOLN: 5.0000 mL | INTRAMUSCULAR | Status: DC | PRN

## 2016-07-15 MED ORDER — SODIUM CHLORIDE 0.9 % IV SOLN: Freq: Once | INTRAVENOUS | Status: DC

## 2016-07-15 MED ORDER — LIDOCAINE-PRILOCAINE 2.5-2.5 % EX CREA: 1.0000 "application " | TOPICAL_CREAM | CUTANEOUS | Status: DC | PRN

## 2016-07-15 MED ORDER — SODIUM CHLORIDE 0.9 % IV SOLN: 100.0000 mL | INTRAVENOUS | Status: DC | PRN

## 2016-07-15 MED ORDER — HEPARIN SODIUM (PORCINE) 1000 UNIT/ML DIALYSIS
1000.0000 [IU] | INTRAMUSCULAR | Status: DC | PRN
  Filled 2016-07-15: qty 1

## 2016-07-15 MED ORDER — HEPARIN SODIUM (PORCINE) 1000 UNIT/ML DIALYSIS: 1000.0000 [IU] | INTRAMUSCULAR | Status: DC | PRN

## 2016-07-15 MED ORDER — ALTEPLASE 2 MG IJ SOLR: 2.0000 mg | Freq: Once | INTRAMUSCULAR | Status: DC | PRN

## 2016-07-15 MED ORDER — PENTAFLUOROPROP-TETRAFLUOROETH EX AERO: 1.0000 "application " | INHALATION_SPRAY | CUTANEOUS | Status: DC | PRN

## 2016-07-15 NOTE — Progress Notes (Signed)
PULMONARY / CRITICAL CARE MEDICINE   Name: Angelica Beck MRN: [id number] DOB: 08-Jul-1951    ADMISSION DATE:  06/20/2016 CONSULTATION DATE:  06/20/2016  REFERRING MD:  EDP Dr Cathleen Fears   CHIEF COMPLAINT:  AMS  BRIEF   65 year old female with past medical history as below, which is significant for stroke with subsequent debilitation, diabetes, chronic kidney disease, chronic urinary tract infections due to incontinence, for which she takes chronic antibiotics. She lives at home with her daughter where she requires full assistance she is unable to truly ambulate and spends most of her time in a hospital bed/recliner. She relies on family members almost entirely for activities of daily living. She was in her usual state of health until about 9/23 when she developed nausea and vomiting. This persisted for 2 days until symptoms subsided, however, she continued to complain of abdominal pain. 9/26 when the patient's daughter got home from work the patient was found to be unresponsive. EMS was called and she was found hypoglycemic which was corrected, however, altered mental status did not improve. Upon arrival to the emergency department she continued to be lethargic and was found to be hypotensive with hypoxemia. Laboratory evaluation significant for potassium 7.4, bicarbonate less than 7, creatinine 6.51, BUN 95, WBC 12.3, lactic acid 13.88, pH 7.07.  STUDIES:  CT Head 9/27:  No acute abnormality. Chronic small vessel ischemic changes. Chronic lacunar infarct left basal ganglia & chronic ischemia right side of pons.  CT Abd/Pelvis w/o 9/27:  Early partial SBO w/ transition point LLQ with adhesions. Left humerus soft tissue swelling & intramuscular gas. Punctate hepatic granulomas noted.  Left Arm X-ray 9/27:  Diffuse soft tissue edema.  TTE 9/27:  LV w/ EF 60-65%. Grade 1 Diastolic dysfunction. No regional wall motion abnormalities. LA & RA normal in size. RV normal in size & function. Mild AS w/o AR.  Mild MR w/o MS.  ABIs 10/3: R normal. L posterior artery noncompressible; suspect calcification.  CT head 10/3: No acute infarct, no hemorrhage or mass. Atrophy evident. Similar to prior study.   MICROBIOLOGY: MRSA PCR 9/27:  Negative Blood Ctx x 2 9/26: Coag Neg Staph 1/2 Urine Ctx 9/26:  5000 CFU E coli & 1000 CFU Klebsiella  C diff PCR 10/10:  Toxin & Antigen Positive  ANTIBIOTICS: Levaquin 9/26 - 9/28 Aztreonam 9/26 - 9/27 Vancomycin 9/26 - off Merrem 9/27 - 10/3 PO Vancomycin 10/10 >>\ Flagyl IV 10/15 (while NGT is out) > 10/16  SIGNIFICANT EVENTS: 9/26 - admit for presumed septic shock 10/14 - extubated 10/16 - reintubated 10/18 - HD perm cath placed  LINES/TUBES: OETT 7.5 9/26 - 10/14, OETT 10/16 >>10/20, TRACH 10/20 (ENT Dr Redmond Baseman)  OGT 9/26 - 10/14 OGT 10/16 >> L IJ CVL 9/26 >> Foley 9/26 >> 10/8 RIJ HD 9/30 >> 10/18 Rectal pouch 10/17 > 10/18 NG tube 10/17 >> R HD Perm Cath 10/18 >>    SUBJECTIVE/OVERNIGHT/INTERVAL HX 07/15/16 - not on sedation - nods to answers . Can wiggle toes but that is it. Trach site ok. Undergong HD.     VITAL SIGNS: BP 134/68   Pulse 100   Temp 98.3 F (36.8 C) (Oral)   Resp (!) 24   Ht '5\' 6"'$  (1.676 m)   Wt 99.9 kg (220 lb 3.8 oz)   SpO2 100%   BMI 35.55 kg/m   HEMODYNAMICS:    VENTILATOR SETTINGS: Vent Mode: PRVC FiO2 (%):  [40 %] 40 % Set Rate:  [15 bmp] 15 bmp Vt  Set:  [470 mL] 470 mL PEEP:  [5 cmH20] 5 cmH20 Plateau Pressure:  [18 KYH06-23 cmH20] 24 cmH20  INTAKE / OUTPUT: I/O last 3 completed shifts: In: 7628 [I.V.:500; NG/GT:415; IV Piggyback:100] Out: 100 [Blood:100]  PHYSICAL EXAMINATION: General:  Obese female. . No family at bedside. S/p trach Neuro: Nods appropriately. Tracks to voice. wigggles toes HEENT:  No scleral icterus. Goiter noted. Moving neck well.  Cardiovascular:  Regular rhythm. Ansarca persists but is improving. 1+ PE to knees bilaterally Pulmonary: Normal work of breathing. Good  aeration bilaterally.  Abdomen:  Soft. Protuberant. Normoactive bowel sounds. Musculoskeletal:  No joint deformity or effusion. Lifting head off of bed. Skin:  Warm & dry. No rash on exposed skin.  LABS:  PULMONARY  Recent Labs Lab 07/08/16 2008 07/10/16 1229 07/10/16 1432 07/11/16 0817  PHART 7.445 7.324* 7.391 7.358  PCO2ART 37.9 38.6 33.4 36.5  PO2ART 104.0 121* 240.0* 119*  HCO3 26.1 19.5* 20.5 20.0  TCO2 27  --  22  --   O2SAT 98.0 98.0 100.0 97.8    CBC  Recent Labs Lab 07/13/16 0406 07/14/16 0430 07/15/16 0440  HGB 7.5* 7.6* 6.7*  HCT 23.3* 23.6* 21.0*  WBC 20.6* 29.2* 27.2*  PLT 377 392 374    COAGULATION  Recent Labs Lab 07/10/16 1300 07/11/16 0400  INR 1.16 1.32    CARDIAC  No results for input(s): TROPONINI in the last 168 hours. No results for input(s): PROBNP in the last 168 hours.   CHEMISTRY  Recent Labs Lab 07/10/16 0540 07/11/16 0400 07/12/16 0506 07/13/16 0406 07/13/16 0652 07/14/16 0430 07/15/16 0440  NA 135 138 135  --  135 136 134*  K 4.1 3.9 3.0*  --  4.0 3.3* 3.7  CL 99* 102 99*  --  102 99* 98*  CO2 21* 22 25  --  '24 27 26  '$ GLUCOSE 138* 219* 92  --  119* 169* 198*  BUN 82* 106* 58*  --  66* 23* 30*  CREATININE 3.72* 4.38* 3.32*  --  3.98* 2.31* 3.17*  CALCIUM 9.1 8.6* 8.6*  --  8.3* 8.3* 8.1*  MG 1.9 2.0 1.8 1.9  --  1.8 2.1  PHOS 6.8* 7.5* 4.6  --  5.3*  --  4.7*   Estimated Creatinine Clearance: 21.7 mL/min (by C-G formula based on SCr of 3.17 mg/dL (H)).   LIVER  Recent Labs Lab 07/10/16 1300 07/11/16 0400 07/12/16 0506 07/13/16 0652 07/14/16 0430 07/15/16 0440  AST  --   --  51*  --  38  --   ALT  --   --  50  --  40  --   ALKPHOS  --   --  228*  --  177*  --   BILITOT  --   --  0.3  --  0.5  --   PROT  --   --  5.7*  --  5.7*  --   ALBUMIN  --  1.5* 1.5* 1.6* 1.5* 1.5*  INR 1.16 1.32  --   --   --   --      INFECTIOUS  Recent Labs Lab 07/14/16 0430 07/15/16 0440  LATICACIDVEN  --  0.8   PROCALCITON 1.92 1.82     ENDOCRINE CBG (last 3)   Recent Labs  07/14/16 2353 07/15/16 0354 07/15/16 0809  GLUCAP 224* 185* 147*         IMAGING x48h  - image(s) personally visualized  -   highlighted in bold  Dg Chest Port 1 View  Result Date: 07/15/2016 CLINICAL DATA:  Followup exam.  Intubated patient. EXAM: PORTABLE CHEST 1 VIEW COMPARISON:  07/14/2016 FINDINGS: Tracheostomy tube, right dual-lumen central venous line, left subclavian central venous line and enteric tube are stable and well positioned. There is persistent opacity at the left lung base obscuring the left hemidiaphragm consistent with pneumonia or atelectasis. No new lung abnormalities. No pneumothorax. IMPRESSION: 1. Support apparatus is stable and well positioned. 2. Persistent left lower lung zone opacity consistent with atelectasis or pneumonia. Electronically Signed   By: Lajean Manes M.D.   On: 07/15/2016 07:38   Dg Chest Port 1 View  Result Date: 07/14/2016 CLINICAL DATA:  Left central venous catheter placement. EXAM: PORTABLE CHEST 1 VIEW COMPARISON:  07/14/2016 and prior radiographs FINDINGS: A new left subclavian central venous catheter is identified with tip overlying mid SVC noted. A left IJ central venous catheter with tip overlying the superior cavoatrial junction and right subclavian central venous catheter with tip overlying the lower SVC again noted. A tracheostomy tube is now identified. There is no evidence of pneumothorax. Increased left lower lung atelectasis/ consolidation noted. Pulmonary vascular congestion is present. No other changes identified. IMPRESSION: Left subclavian central venous catheter with tip overlying the mid SVC. No evidence of pneumothorax. Increased left lower lung atelectasis/consolidation. Electronically Signed   By: Margarette Canada M.D.   On: 07/14/2016 14:47   Dg Chest Port 1 View  Result Date: 07/14/2016 CLINICAL DATA:  Intubation, hypertension, stroke, diabetes  mellitus EXAM: PORTABLE CHEST 1 VIEW COMPARISON:  Portable exam 0408 hours compared to 07/13/2016 FINDINGS: Tip of endotracheal tube projects 2.3 cm above carina. Feeding tube extends into stomach. BILATERAL jugular central venous catheters with tips projecting over SVC. Normal heart size, mediastinal contours, and pulmonary vascularity. Mild atelectasis at RIGHT base. Persistent atelectasis versus consolidation LEFT lower lobe though aeration at LEFT base has slightly improved. Small layering LEFT pleural effusion. No pneumothorax. IMPRESSION: LEFT lower lobe atelectasis versus consolidation with associated LEFT pleural effusion. Improved aeration and mildly decreased LEFT pleural effusion versus previous study. Electronically Signed   By: Lavonia Dana M.D.   On: 07/14/2016 07:50       DISCUSSION: 65 y.o. female with T2DM, CKD, recurrent UTIs 2/2 incontinence, and stroke with subsequent severe debilitation (Left sided weakness; ECOG 4), who presented with AMS and found to be in shock, likely 2/2 UTI sepsis. Some improvement in level of alertness with CRRT. Completed 7-day course of meropenem 10/3. Extubated 10/14 but requiring intermittent noninvasive positive pressure ventilation. Also being treated for C diff, on day 9 of PO vancomycin. Plan for tracheostomy by ENT 10/20. Plan for G-tube placement by IR 10/23.   ASSESSMENT / PLAN:  PULMONARY A: Acute on chronic Hypoxic Respiratory Failure - inability to protect airway with severe deconditioning and weakness and OHS s/p ENT Trach 07/14/16  - ful vent support  P:   Full PRVC support Nastotracheal suctioning Incentive Spirometry  Weaning FiO2 for sat >92%  CARDIOVASCULAR A:  Shock - Likely sepsis. Cortisol 45.1. Resolved. H/O HTN & HLD   - stable   P:  Vitals per unit protocol Continuous telemetry monitoring Diuresis with HD TThSa  RENAL A:   Acute On Chronic Renal Failure Stage IV  - worsens without HD treatments Profound  Metabolic Acidosis - Resolved w/ HD. Hyperkalemia - Resolved.  Lactic Acidosis - Resolved. Likely due to hypoperfusion in setting of Metformin.   Hyperphosphatemia - Secondary to acute renal failure.  Pseudohypocalcemia - Resolved.   - 0n hd 07/15/16  P:   Appreciate Nephrology recommendations Dialysis TTS Trending electrolytes daily Replacing electrolytes as indicated Perm-a-cath placed 10/18, with previous HD cath removed  GASTROINTESTINAL A:   Partial SBO - Resolving on XR abd 9/30. Having BMs. H/O Ventral Hernia Repair. Possible Liver Cirrhosis. RUQ U/S normal. Ammonia WNL 10/5.  Transaminitis - Likely shock liver versus congestive hepatopathy. Improving.  Diarrhea - Secondary to C diff.    - On TF 07/15/16  P:   Continue TF Pepcid VT daily once replaced Trending LFTs Intermittently  Consulted IR about possible G-tube placement   HEMATOLOGIC A:   Anemia - Chronic. No signs of active bleeding. S/P 1u on 10/6 & 1u on 10/11. Hgb relatively stable. Leukocytosis - Likely sepsis. Resolving. 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 Unlikely with neg SRA. Thrombocytopenia - Resolved.   - hgb < 7gm% on 07/15/16 and getting 1 unit prbc  P:  Trending cell counts daily w/ CBC Transfusion goal of 7.0 SCDs Heparin Stevenson q8hr Aspirin 162 mg  INFECTIOUS A:   Severe Sepsis  C Diff Colitis - Found 10/10 & started PO Vancocin. E.coli and Klebsiella UTI - S/P 7 days Merrem. Sacral wound   -ongoing C diff Rx 9:33 AM diarrhea 07/15/16 per RN  P:   PO/OGT Vancomycin Day #10 (Day #11 abx, received flagyl when temporarily lost NG access) Holding home Trimethoprim. Wound care consulted   ENDOCRINE A:   Hypoglycemia - Resolved with Dextrose IVF. H/O DM Type 2 - A1c 5.8. H/O Hyperthyroidism - On PTU at home. TSH 0.331 & F T4 1.33 9/26. Repeat TSH 0.458 & FT4 1.78 on 10/14. TSH 0.286 10/18. Free T4 1.90   - nil acute P:   SSI per Sensitive Algorithm. Decrease levemir to 10 U daily from  BID; hold when NPO 10/20 Holding PTU & Metformin. Increase methimazole from '5mg'$  to 10 mg VT daily Repeat TSH, free T4 next week  NEUROLOGIC A:   Acute Encephalopathy - at admit Repeat CT head unchanged. Folate WNL. H/O CVA - Minimal mobility.  07/15/16 - nods approproiately .Significantly deconditioned +   P:   Avoid sedating meds  MUSCULOSKELETAL A: Edema Left Arm - Improved. Secondary to IV infiltration. No gas on X-ray 9/27.  P: Continue to monitor   GLOBAL Very deconditioned. Chronic critically ill  P Support PT as possilbe Move to sdu 07/15/2016    FAMILY  - Updates: Spoke with daughter 10/19 about continued plan for tracheostomy 10/20 and likely g-tube placement 10/23.  None at bedside 07/15/16  - Inter-disciplinary family meet or Palliative Care meeting due by: Continued [place] discussion 10/6 with daughter [name]. Wants aggressive treatment.     Dr. Brand Males, M.D., [facility].C.P Pulmonary and Critical Care Medicine Staff Physician [facility] Pager: [phone], If no answer or between  15:00h - 7:00h: call [phone]  07/15/2016 9:35 AM

## 2016-07-15 NOTE — Op Note (Signed)
NAME:  Angelica Beck, Angelica Beck NO.:  [id number]  MEDICAL RECORD NO.:  [id number]  LOCATION:  2M07C                        FACILITY:  [facility]  PHYSICIAN:  Onnie Graham, MD     DATE OF BIRTH:  Dec 13, 1950  DATE OF PROCEDURE:  07/14/2016 DATE OF DISCHARGE:                              OPERATIVE REPORT   PREOPERATIVE DIAGNOSES: 1. Respiratory failure. 2. Thyroid goiter.  POSTOPERATIVE DIAGNOSES: 1. Respiratory failure. 2. Thyroid goiter.  PROCEDURE: 1. Tracheostomy. 2. Thyroid isthmusectomy.  SURGEON:  Leane Para. Redmond Baseman, M.D.  ANESTHESIA:  General endotracheal anesthesia.  COMPLICATIONS:  None.  INDICATION:  The patient is a 65 year old African American female with multiple medical problems, who has been critically ill on mechanical ventilator for over 2 weeks.  With difficulty weaning and requirement of re-intubation recently, tracheostomy has been requested.  She has a longstanding thyroid goiter.  She presents to the operating room for surgical management.  FINDINGS:  There is a diffusely enlarged thyroid gland overlying the trachea.  The middle section of the thyroid gland comprising the isthmus was removed and sent for pathology.  A #6 proximal extended length cuffed trach tube was used.  DESCRIPTION OF PROCEDURE:  The patient was identified in the ICU and informed consent having been obtained from her daughter including discussion of risks, benefits, and alternatives, she was moved to the operative suite, put on the operative table in supine position. Endotracheal anesthesia was maintained.  She was positioned with a shoulder roll.  The lower neck incision was marked in a horizontal fashion and injected with 1% lidocaine with 1:100,000 of epinephrine. The neck was prepped and draped in sterile fashion.  Incision was made with a 15 blade scalpel through the skin and through the subcutaneous tissues using Bovie electrocautery.  The platysma layer was divided  as well in this way in a horizontal fashion and small subplatysmal flaps were elevated superiorly and inferiorly.  The midline raphe of the strap muscles was then divided using Bovie electrocautery, and the strap muscles retracted in either direction.  The large thyroid was immediately encountered.  With palpation of the thyroid cartilage above and the trachea below, an incision was made into the thyroid isthmus to the left side of midline using Bovie electrocautery and the tissue was divided gradually using Bovie electrocautery and bipolar electrocautery down to the trachea.  A second incision was made to the right side of midline and the same performed partially down to the trachea.  The intervening thyroid tissue was then removed in a piecemeal fashion and the tissue continued to be removed down to the trachea.  Tissue was passed to nursing for pathology.  Bleeding was controlled during the process using Bovie and bipolar electrocautery as well as packing at times and at this point, bleeding was further controlled with bipolar electrocautery.  The thyroid lobes were then retracted either side and the soft tissues were divided from the anterior tracheal wall.  The cricoid cartilage was palpated and elevated using a cricoid hook.  An incision was made in a horizontal fashion between rings 2 and 3 of the trachea and extended with scissors.  The stay sutures were placed around the ring above and  the ring below the tracheostomy site consisting 2-0 silk suture.  The balloon on the endotracheal tube was then deflated and the tube backed out by Anesthesia above the tracheostomy site.  The area was suctioned and the #6 proximal extended length cuffed trach tube was then placed down into the trachea without difficulty and the cuff inflated.  The inner cannula was added, and the anesthesia circuit was hooked to the trach tube with good ventilation and no drop in oxygen saturation.  The stay  sutures were tied with 2 knots above and 1 knot below the trach site.  The wound was then packed all around the trach site using a 6-foot length of Vaseline gauze with the tag hanging out inferiorly.  The trach tube was then sutured to the skin using 0 silk suture in 4 quadrants.  A trach dressing and Velcro trach tie were added.  The patient was cleaned off and returned to Anesthesia for wake up.  She was moved back to the intensive care unit in stable condition.     Onnie Graham, MD     DDB/MEDQ  D:  07/14/2016  T:  07/15/2016  Job:  [id number]

## 2016-07-15 NOTE — Progress Notes (Signed)
Spoke with MD and informed him of patient's low grade temp of 100.8.  MD stated to monitor patient and if Temp increases to 101,  MD would like to be called.

## 2016-07-15 NOTE — Progress Notes (Signed)
Report called to [facility] for [phone] and patient's daughter called to inform of room number

## 2016-07-15 NOTE — Procedures (Signed)
I have seen and examined this patient and agree with the plan of care   BP 117/60  Goal 3.5 L   Right subclavian   Diffuse edema       Ca 8.1   Phos 4.7    CO2  26    Mg 2.1                                  Hb 7.6                                        Lia Vigilante W 07/15/2016, 9:50 AM

## 2016-07-15 NOTE — Progress Notes (Signed)
[facility] Progress Note Patient Name: Angelica Beck DOB: August 12, 1951 MRN: [id number]   Date of Service  07/15/2016  HPI/Events of Note  Hgb Drop from 7.6 to 6.7  eICU Interventions  Plan: Transfuse 1 unit pRBC Post-transfusion CBC     Intervention Category Intermediate Interventions: Other:  DETERDING,ELIZABETH 07/15/2016, 6:01 AM

## 2016-07-15 NOTE — Progress Notes (Signed)
Patient transferred to 4E17 via bed with RT on monitor.tolerated well.

## 2016-07-16 DIAGNOSIS — J9621 Acute and chronic respiratory failure with hypoxia: Secondary | ICD-10-CM

## 2016-07-16 DIAGNOSIS — J189 Pneumonia, unspecified organism: Secondary | ICD-10-CM

## 2016-07-16 LAB — CBC WITH DIFFERENTIAL/PLATELET
BASOS PCT: 0 %
Basophils Absolute: 0 10*3/uL (ref 0.0–0.1)
EOS ABS: 0.3 10*3/uL (ref 0.0–0.7)
Eosinophils Relative: 1 %
HCT: 22.6 % — ABNORMAL LOW (ref 36.0–46.0)
HEMOGLOBIN: 7.3 g/dL — AB (ref 12.0–15.0)
LYMPHS ABS: 1.6 10*3/uL (ref 0.7–4.0)
LYMPHS PCT: 5 %
MCH: 28.6 pg (ref 26.0–34.0)
MCHC: 32.3 g/dL (ref 30.0–36.0)
MCV: 88.6 fL (ref 78.0–100.0)
Monocytes Absolute: 1.3 10*3/uL — ABNORMAL HIGH (ref 0.1–1.0)
Monocytes Relative: 4 %
NEUTROS ABS: 28.7 10*3/uL — AB (ref 1.7–7.7)
Neutrophils Relative %: 90 %
Platelets: 361 10*3/uL (ref 150–400)
RBC: 2.55 MIL/uL — ABNORMAL LOW (ref 3.87–5.11)
RDW: 17.1 % — AB (ref 11.5–15.5)
WBC: 31.9 10*3/uL — ABNORMAL HIGH (ref 4.0–10.5)

## 2016-07-16 LAB — CULTURE, RESPIRATORY

## 2016-07-16 LAB — T4, FREE: FREE T4: 2.63 ng/dL — AB (ref 0.61–1.12)

## 2016-07-16 LAB — LACTIC ACID, PLASMA: LACTIC ACID, VENOUS: 0.9 mmol/L (ref 0.5–1.9)

## 2016-07-16 LAB — RENAL FUNCTION PANEL
Albumin: 1.5 g/dL — ABNORMAL LOW (ref 3.5–5.0)
Anion gap: 10 (ref 5–15)
BUN: 14 mg/dL (ref 6–20)
CHLORIDE: 100 mmol/L — AB (ref 101–111)
CO2: 28 mmol/L (ref 22–32)
CREATININE: 2.38 mg/dL — AB (ref 0.44–1.00)
Calcium: 8.5 mg/dL — ABNORMAL LOW (ref 8.9–10.3)
GFR calc Af Amer: 24 mL/min — ABNORMAL LOW (ref >60)
GFR, EST NON AFRICAN AMERICAN: 21 mL/min — AB (ref >60)
GLUCOSE: 153 mg/dL — AB (ref 65–99)
Phosphorus: 3 mg/dL (ref 2.5–4.6)
Potassium: 3.5 mmol/L (ref 3.5–5.1)
Sodium: 138 mmol/L (ref 135–145)

## 2016-07-16 LAB — CULTURE, RESPIRATORY W GRAM STAIN

## 2016-07-16 LAB — GLUCOSE, CAPILLARY
GLUCOSE-CAPILLARY: 139 mg/dL — AB (ref 65–99)
GLUCOSE-CAPILLARY: 149 mg/dL — AB (ref 65–99)
GLUCOSE-CAPILLARY: 158 mg/dL — AB (ref 65–99)
GLUCOSE-CAPILLARY: 161 mg/dL — AB (ref 65–99)
GLUCOSE-CAPILLARY: 165 mg/dL — AB (ref 65–99)
Glucose-Capillary: 162 mg/dL — ABNORMAL HIGH (ref 65–99)
Glucose-Capillary: 194 mg/dL — ABNORMAL HIGH (ref 65–99)

## 2016-07-16 LAB — TSH: TSH: 0.075 u[IU]/mL — AB (ref 0.350–4.500)

## 2016-07-16 LAB — MAGNESIUM: Magnesium: 2 mg/dL (ref 1.7–2.4)

## 2016-07-16 MED ORDER — SODIUM CHLORIDE 0.9% FLUSH
10.0000 mL | Freq: Two times a day (BID) | INTRAVENOUS | Status: DC
Start: 2016-07-16 — End: 2016-07-18
  Administered 2016-07-16 – 2016-07-18 (×5): 10 mL via INTRAVENOUS

## 2016-07-16 MED ORDER — CIPROFLOXACIN IN D5W 400 MG/200ML IV SOLN
400.0000 mg | INTRAVENOUS | Status: DC
  Administered 2016-07-16 – 2016-07-18 (×3): 400 mg via INTRAVENOUS
  Filled 2016-07-16 (×3): qty 200

## 2016-07-16 MED ORDER — SODIUM CHLORIDE 0.9% FLUSH
10.0000 mL | INTRAVENOUS | Status: DC | PRN
  Administered 2016-07-16: 10 mL via INTRAVENOUS

## 2016-07-16 MED ORDER — HEPARIN SODIUM (PORCINE) 5000 UNIT/ML IJ SOLN
5000.0000 [IU] | Freq: Three times a day (TID) | INTRAMUSCULAR | Status: DC
  Administered 2016-07-17 – 2016-07-18 (×3): 5000 [IU] via SUBCUTANEOUS
  Filled 2016-07-16 (×3): qty 1

## 2016-07-16 NOTE — Progress Notes (Signed)
Pharmacy Antibiotic Note  Angelica Beck is a 65 y.o. female admitted on 06/20/2016 with pneumonia and sepsis. Pharmacy has been consulted for ciprofloxacin dosing. Patient with h/o ESRD on HD Tuesday, Thursday, Saturday. Last session 10/21, fully tolerated session. WBC increased to 31.9, tmax 100.8, lactate slightly increased to 0.9 but wnl. Sputum culture is positive for E coli, resistant to ampicillin and Bactrim, intermittently sensitive to Unasyn, sensitive to all others (including ciprofloxacin).   Plan: Ciprofloxacin 400 mg IV daily Monitor CBC and clinical improvement    Height: '5\' 6"'$  (167.6 cm) Weight: 213 lb 13.5 oz (97 kg) IBW/kg (Calculated) : 59.3  Temp (24hrs), Avg:99.3 F (37.4 C), Min:98.2 F (36.8 C), Max:100.8 F (38.2 C)   Recent Labs Lab 07/12/16 0506 07/13/16 0406 07/13/16 0652 07/14/16 0430 07/15/16 0440 07/16/16 0300  WBC 20.1* 20.6*  --  29.2* 27.2* 31.9*  CREATININE 3.32*  --  3.98* 2.31* 3.17* 2.38*  LATICACIDVEN  --   --   --   --  0.8 0.9    Estimated Creatinine Clearance: 28.4 mL/min (by C-G formula based on SCr of 2.38 mg/dL (H)).    Allergies  Allergen Reactions  . Penicillins Swelling    FACIAL SWELLING  . Lactose Intolerance (Gi) Diarrhea and Nausea And Vomiting    Antimicrobials this admission: Vanc 9/26 >> 9/30 Azactam 9/26 >>9/27 LVQ 9/26 >>9/27 Merrem 9/27>>10/3 PO Vanc 10/10 >> 10/15 (lost po access); 10/16 >> IV Flagyl 10/15 >>10/16 Cipro 10/22 >>  Dose adjustments this admission: none  Microbiology results: 9/27 MRSA PCR: neg 9/26 BCx: CONS (BCID: staph species) 1/2 9/26 UCx: 5k col Ecoli, 1k col Kleb pneumo - likely colonized 10/10 Cdiff positive 10/20 Trach aspirate: few E coli, R amp and Bactrim, Intermediate sens to Unasyn, S to all others  Thank you for allowing pharmacy to be a part of this patient's care.  Angelica Beck, PharmD PGY1 Pharmacy Resident [phone] (Pager) 07/16/2016 9:52 AM

## 2016-07-16 NOTE — Progress Notes (Signed)
PULMONARY / CRITICAL CARE MEDICINE   Name: Angelica Beck MRN: [id number] DOB: [birth year]    ADMISSION DATE:  06/20/2016 CONSULTATION DATE:  06/20/2016  REFERRING MD:  EDP Dr Cathleen Fears   CHIEF COMPLAINT:  AMS  BRIEF   65 year old female with past medical history as below, which is significant for stroke with subsequent debilitation, diabetes, chronic kidney disease, chronic urinary tract infections due to incontinence, for which she takes chronic antibiotics. She lives at home with her daughter where she requires full assistance she is unable to truly ambulate and spends most of her time in a hospital bed/recliner. She relies on family members almost entirely for activities of daily living. She was in her usual state of health until about 9/23 when she developed nausea and vomiting. This persisted for 2 days until symptoms subsided, however, she continued to complain of abdominal pain. 9/26 when the patient's daughter got home from work the patient was found to be unresponsive. EMS was called and she was found hypoglycemic which was corrected, however, altered mental status did not improve. Upon arrival to the emergency department she continued to be lethargic and was found to be hypotensive with hypoxemia. Laboratory evaluation significant for potassium 7.4, bicarbonate less than 7, creatinine 6.51, BUN 95, WBC 12.3, lactic acid 13.88, pH 7.07.  STUDIES:  CT Head 9/27:  No acute abnormality. Chronic small vessel ischemic changes. Chronic lacunar infarct left basal ganglia & chronic ischemia right side of pons.  CT Abd/Pelvis w/o 9/27:  Early partial SBO w/ transition point LLQ with adhesions. Left humerus soft tissue swelling & intramuscular gas. Punctate hepatic granulomas noted.  Left Arm X-ray 9/27:  Diffuse soft tissue edema.  TTE 9/27:  LV w/ EF 60-65%. Grade 1 Diastolic dysfunction. No regional wall motion abnormalities. LA & RA normal in size. RV normal in size & function. Mild AS w/o AR.  Mild MR w/o MS.  ABIs 10/3: R normal. L posterior artery noncompressible; suspect calcification.  CT head 10/3: No acute infarct, no hemorrhage or mass. Atrophy evident. Similar to prior study.   MICROBIOLOGY: MRSA PCR 9/27:  Negative Blood Ctx x 2 9/26: Coag Neg Staph 1/2 Urine Ctx 9/26:  5000 CFU E coli & 1000 CFU Klebsiella  C diff PCR 10/10:  Toxin & Antigen Positive BC 10/20: E.coli  ANTIBIOTICS: Levaquin 9/26 - 9/28 Aztreonam 9/26 - 9/27 Vancomycin 9/26 - off Merrem 9/27 - 10/3 PO Vancomycin 10/10 >>\ Flagyl IV 10/15 (while NGT is out) > 10/16 Cefepime 10/22>>  SIGNIFICANT EVENTS: 9/26 - admit for presumed septic shock 10/14 - extubated 10/16 - reintubated 10/18 - HD perm cath placed  LINES/TUBES: OETT 7.5 9/26 - 10/14, OETT 10/16 >>10/20, TRACH 10/20 (ENT Dr Redmond Baseman)  OGT 9/26 - 10/14 OGT 10/16 >> L IJ CVL 9/26 >> Foley 9/26 >> 10/8 RIJ HD 9/30 >> 10/18 Rectal pouch 10/17 > 10/18 NG tube 10/17 >> R HD Perm Cath 10/18 >>    SUBJECTIVE/OVERNIGHT/INTERVAL HX 07/15/16 - not on sedation - nods to answers . Can wiggle toes , shake head. Trach site ok. Undergong HD.     VITAL SIGNS: BP (!) 121/58   Pulse (!) 103   Temp 99.2 F (37.3 C) (Oral)   Resp 16   Ht '5\' 6"'$  (1.676 m)   Wt 97 kg (213 lb 13.5 oz)   SpO2 98%   BMI 34.52 kg/m   HEMODYNAMICS:    VENTILATOR SETTINGS: Vent Mode: PRVC FiO2 (%):  [30 %-40 %] 30 % Set Rate:  [  15 bmp] 15 bmp Vt Set:  [470 mL] 470 mL PEEP:  [5 cmH20] 5 cmH20 Plateau Pressure:  [18 cmH20-23 cmH20] 23 cmH20  INTAKE / OUTPUT: I/O last 3 completed shifts: In: 765 [Blood:335; NG/GT:450] Out: 2515 [Other:2515]  PHYSICAL EXAMINATION: General:  Obese female. . No family at bedside. S/p trach Neuro: Nods appropriately. Tracks to voice. wigggles toes HEENT:  No scleral icterus. Goiter noted. Moving neck well.  Cardiovascular:  Regular rhythm. Ansarca persists but is improving. 1+ PE to knees bilaterally Pulmonary: Normal  work of breathing. Good aeration bilaterally.Few rhonchi  Abdomen:  Soft. Protuberant. Normoactive bowel sounds. Musculoskeletal:  No joint deformity or effusion. Lifting head off of bed. Skin:  Warm & dry. No rash on exposed skin.  LABS:  PULMONARY  Recent Labs Lab 07/10/16 1229 07/10/16 1432 07/11/16 0817  PHART 7.324* 7.391 7.358  PCO2ART 38.6 33.4 36.5  PO2ART 121* 240.0* 119*  HCO3 19.5* 20.5 20.0  TCO2  --  22  --   O2SAT 98.0 100.0 97.8    CBC  Recent Labs Lab 07/14/16 0430 07/15/16 0440 07/16/16 0300  HGB 7.6* 6.7* 7.3*  HCT 23.6* 21.0* 22.6*  WBC 29.2* 27.2* 31.9*  PLT 392 374 361    COAGULATION  Recent Labs Lab 07/10/16 1300 07/11/16 0400  INR 1.16 1.32    CARDIAC  No results for input(s): TROPONINI in the last 168 hours. No results for input(s): PROBNP in the last 168 hours.   CHEMISTRY  Recent Labs Lab 07/11/16 0400 07/12/16 0506 07/13/16 0406 07/13/16 0652 07/14/16 0430 07/15/16 0440 07/16/16 0300  NA 138 135  --  135 136 134* 138  K 3.9 3.0*  --  4.0 3.3* 3.7 3.5  CL 102 99*  --  102 99* 98* 100*  CO2 22 25  --  '24 27 26 28  '$ GLUCOSE 219* 92  --  119* 169* 198* 153*  BUN 106* 58*  --  66* 23* 30* 14  CREATININE 4.38* 3.32*  --  3.98* 2.31* 3.17* 2.38*  CALCIUM 8.6* 8.6*  --  8.3* 8.3* 8.1* 8.5*  MG 2.0 1.8 1.9  --  1.8 2.1 2.0  PHOS 7.5* 4.6  --  5.3*  --  4.7* 3.0   Estimated Creatinine Clearance: 28.4 mL/min (by C-G formula based on SCr of 2.38 mg/dL (H)).   LIVER  Recent Labs Lab 07/10/16 1300 07/11/16 0400 07/12/16 0506 07/13/16 0652 07/14/16 0430 07/15/16 0440 07/16/16 0300  AST  --   --  51*  --  38  --   --   ALT  --   --  50  --  40  --   --   ALKPHOS  --   --  228*  --  177*  --   --   BILITOT  --   --  0.3  --  0.5  --   --   PROT  --   --  5.7*  --  5.7*  --   --   ALBUMIN  --  1.5* 1.5* 1.6* 1.5* 1.5* 1.5*  INR 1.16 1.32  --   --   --   --   --      INFECTIOUS  Recent Labs Lab 07/14/16 0430  07/15/16 0440 07/16/16 0300  LATICACIDVEN  --  0.8 0.9  PROCALCITON 1.92 1.82  --      ENDOCRINE CBG (last 3)   Recent Labs  07/15/16 2329 07/16/16 0352 07/16/16 0809  GLUCAP 192* 139*  162*         IMAGING x48h  - image(s) personally visualized  -   highlighted in bold Dg Chest Port 1 View  Result Date: 07/15/2016 CLINICAL DATA:  Followup exam.  Intubated patient. EXAM: PORTABLE CHEST 1 VIEW COMPARISON:  07/14/2016 FINDINGS: Tracheostomy tube, right dual-lumen central venous line, left subclavian central venous line and enteric tube are stable and well positioned. There is persistent opacity at the left lung base obscuring the left hemidiaphragm consistent with pneumonia or atelectasis. No new lung abnormalities. No pneumothorax. IMPRESSION: 1. Support apparatus is stable and well positioned. 2. Persistent left lower lung zone opacity consistent with atelectasis or pneumonia. Electronically Signed   By: Lajean Manes M.D.   On: 07/15/2016 07:38   Dg Chest Port 1 View  Result Date: 07/14/2016 CLINICAL DATA:  Left central venous catheter placement. EXAM: PORTABLE CHEST 1 VIEW COMPARISON:  07/14/2016 and prior radiographs FINDINGS: A new left subclavian central venous catheter is identified with tip overlying mid SVC noted. A left IJ central venous catheter with tip overlying the superior cavoatrial junction and right subclavian central venous catheter with tip overlying the lower SVC again noted. A tracheostomy tube is now identified. There is no evidence of pneumothorax. Increased left lower lung atelectasis/ consolidation noted. Pulmonary vascular congestion is present. No other changes identified. IMPRESSION: Left subclavian central venous catheter with tip overlying the mid SVC. No evidence of pneumothorax. Increased left lower lung atelectasis/consolidation. Electronically Signed   By: Margarette Canada M.D.   On: 07/14/2016 14:47       DISCUSSION: 65 y.o. female with T2DM, CKD,  recurrent UTIs 2/2 incontinence, and stroke with subsequent severe debilitation (Left sided weakness; ECOG 4), who presented with AMS and found to be in shock, likely 2/2 UTI sepsis. Some improvement in level of alertness with CRRT. Completed 7-day course of meropenem 10/3. Extubated 10/14 but requiring intermittent noninvasive positive pressure ventilation. Also being treated for C diff, on day 9 of PO vancomycin. Plan for tracheostomy by ENT 10/20. Plan for G-tube placement by IR 10/23.   ASSESSMENT / PLAN:  PULMONARY A: Acute on chronic Hypoxic Respiratory Failure - inability to protect airway with severe deconditioning and weakness and OHS s/p ENT Trach 07/14/16  - ful vent support  P:   Full PRVC support Nastotracheal suctioning Incentive Spirometry  Weaning FiO2 for sat >92%  CARDIOVASCULAR A:  Shock - Likely sepsis. Cortisol 45.1. Resolved. H/O HTN & HLD   - stable   P:  Vitals per unit protocol Continuous telemetry monitoring Diuresis with HD TThSa  RENAL A:   Acute On Chronic Renal Failure Stage IV  - worsens without HD treatments Profound Metabolic Acidosis - Resolved w/ HD. Hyperkalemia - Resolved.  Lactic Acidosis - Resolved. Likely due to hypoperfusion in setting of Metformin.   Hyperphosphatemia - Secondary to acute renal failure.  Pseudohypocalcemia - Resolved.   - 0n hd 07/15/16  P:   Appreciate Nephrology recommendations Dialysis TTS Trending electrolytes daily Replacing electrolytes as indicated Perm-a-cath placed 10/18, with previous HD cath removed  GASTROINTESTINAL A:   Partial SBO - Resolving on XR abd 9/30. Having BMs. H/O Ventral Hernia Repair. Possible Liver Cirrhosis. RUQ U/S normal. Ammonia WNL 10/5.  Transaminitis - Likely shock liver versus congestive hepatopathy. Improving.  Diarrhea - Secondary to C diff.    - On TF 07/15/16  P:   Continue TF Pepcid VT daily once replaced Trending LFTs Intermittently  Consulted IR about  possible G-tube placement  HEMATOLOGIC A:   Anemia - Chronic. No signs of active bleeding. S/P 1u on 10/6 & 1u on 10/11. Hgb relatively stable. Leukocytosis - Likely sepsis. Resolving. 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 Unlikely with neg SRA. Thrombocytopenia - Resolved.   - hgb < 7gm% on 07/15/16 and getting 1 unit prbc  P:  Trending cell counts daily w/ CBC Transfusion goal of 7.0 SCDs Heparin Platinum q8hr Aspirin 162 mg  INFECTIOUS A:   Severe Sepsis  C Diff Colitis - Found 10/10 & started PO Vancocin. E.coli and Klebsiella UTI - S/P 7 days Merrem. Sacral wound LLL pneumonia, WBC incr 10/22, sputum cx + E coli from 10/20 sens cipro  -ongoing C diff Rx 9:33 AM diarrhea 07/15/16 per RN  P:   PO/OGT Vancomycin Day #10 (Day #11 abx, received flagyl when temporarily lost NG access) Holding home Trimethoprim. Wound care consulted Start cipro 10/22>>  ENDOCRINE A:   Hypoglycemia - Resolved with Dextrose IVF. H/O DM Type 2 - A1c 5.8. H/O Hyperthyroidism - On PTU at home. TSH 0.331 & F T4 1.33 9/26. Repeat TSH 0.458 & FT4 1.78 on 10/14. TSH 0.286 10/18. Free T4 1.90   - nil acute P:   SSI per Sensitive Algorithm. Decrease levemir to 10 U daily from BID; hold when NPO 10/20 Holding PTU & Metformin. Increase methimazole from '5mg'$  to 10 mg VT daily Repeat TSH, free T4 next week  NEUROLOGIC A:   Acute Encephalopathy - at admit Repeat CT head unchanged. Folate WNL. H/O CVA - Minimal mobility.  07/15/16 - nods appropriately .Significantly deconditioned +   P:   Avoid sedating meds  MUSCULOSKELETAL A: Edema Left Arm - Improved. Secondary to IV infiltration. No gas on X-ray 9/27.  P: Continue to monitor   GLOBAL Very deconditioned. Chronic critically ill  P Support PT as possilbe Move to sdu 07/16/2016    FAMILY  - Updates: Spoke with daughter 10/19 about continued plan for tracheostomy 10/20 and likely g-tube placement 10/23.  None at bedside 07/15/16  -  Inter-disciplinary family meet or Palliative Care meeting due by: Continued [place] discussion 10/6 with daughter [name]. Wants aggressive treatment.     CD Annamaria Boots, MD Pulmonary and [facility] Pager: [phone], If no answer or between  15:00h - 7:00h: call [phone]  07/16/2016 9:16 AM

## 2016-07-17 ENCOUNTER — Inpatient Hospital Stay (HOSPITAL_COMMUNITY): Payer: Medicare Other

## 2016-07-17 ENCOUNTER — Encounter (HOSPITAL_COMMUNITY): Payer: Self-pay | Admitting: Diagnostic Radiology

## 2016-07-17 DIAGNOSIS — Z930 Tracheostomy status: Secondary | ICD-10-CM

## 2016-07-17 DIAGNOSIS — J189 Pneumonia, unspecified organism: Secondary | ICD-10-CM

## 2016-07-17 HISTORY — PX: IR GENERIC HISTORICAL: IMG1180011

## 2016-07-17 LAB — TYPE AND SCREEN
ABO/RH(D): O POS
ANTIBODY SCREEN: NEGATIVE
UNIT DIVISION: 0
UNIT DIVISION: 0
Unit division: 0

## 2016-07-17 LAB — CBC WITH DIFFERENTIAL/PLATELET
BASOS PCT: 0 %
Basophils Absolute: 0 10*3/uL (ref 0.0–0.1)
EOS ABS: 0.3 10*3/uL (ref 0.0–0.7)
EOS PCT: 1 %
HCT: 23.4 % — ABNORMAL LOW (ref 36.0–46.0)
Hemoglobin: 7.5 g/dL — ABNORMAL LOW (ref 12.0–15.0)
LYMPHS PCT: 6 %
Lymphs Abs: 1.5 10*3/uL (ref 0.7–4.0)
MCH: 29.1 pg (ref 26.0–34.0)
MCHC: 32.1 g/dL (ref 30.0–36.0)
MCV: 90.7 fL (ref 78.0–100.0)
MONO ABS: 1.5 10*3/uL — AB (ref 0.1–1.0)
Monocytes Relative: 6 %
NEUTROS PCT: 87 %
Neutro Abs: 22 10*3/uL — ABNORMAL HIGH (ref 1.7–7.7)
PLATELETS: 363 10*3/uL (ref 150–400)
RBC: 2.58 MIL/uL — AB (ref 3.87–5.11)
RDW: 16.7 % — AB (ref 11.5–15.5)
WBC: 25.3 10*3/uL — AB (ref 4.0–10.5)

## 2016-07-17 LAB — GLUCOSE, CAPILLARY
GLUCOSE-CAPILLARY: 228 mg/dL — AB (ref 65–99)
GLUCOSE-CAPILLARY: 175 mg/dL — AB (ref 65–99)
Glucose-Capillary: 148 mg/dL — ABNORMAL HIGH (ref 65–99)
Glucose-Capillary: 168 mg/dL — ABNORMAL HIGH (ref 65–99)
Glucose-Capillary: 120 mg/dL — ABNORMAL HIGH (ref 65–99)

## 2016-07-17 LAB — RENAL FUNCTION PANEL
Albumin: 1.5 g/dL — ABNORMAL LOW (ref 3.5–5.0)
Anion gap: 11 (ref 5–15)
BUN: 28 mg/dL — AB (ref 6–20)
CHLORIDE: 100 mmol/L — AB (ref 101–111)
CO2: 28 mmol/L (ref 22–32)
CREATININE: 3.4 mg/dL — AB (ref 0.44–1.00)
Calcium: 8.9 mg/dL (ref 8.9–10.3)
GFR calc Af Amer: 15 mL/min — ABNORMAL LOW (ref >60)
GFR calc non Af Amer: 13 mL/min — ABNORMAL LOW (ref >60)
GLUCOSE: 158 mg/dL — AB (ref 65–99)
POTASSIUM: 3.9 mmol/L (ref 3.5–5.1)
Phosphorus: 4 mg/dL (ref 2.5–4.6)
Sodium: 139 mmol/L (ref 135–145)

## 2016-07-17 LAB — CULTURE, BLOOD (ROUTINE X 2)

## 2016-07-17 LAB — LACTIC ACID, PLASMA: LACTIC ACID, VENOUS: 0.7 mmol/L (ref 0.5–1.9)

## 2016-07-17 LAB — PROTIME-INR
INR: 1.33
PROTHROMBIN TIME: 16.5 s — AB (ref 11.4–15.2)

## 2016-07-17 LAB — MAGNESIUM: Magnesium: 2.1 mg/dL (ref 1.7–2.4)

## 2016-07-17 MED ORDER — MIDAZOLAM HCL 2 MG/2ML IJ SOLN
INTRAMUSCULAR | Status: AC
Start: 2016-07-17 — End: 2016-07-18
  Filled 2016-07-17: qty 2

## 2016-07-17 MED ORDER — FENTANYL CITRATE (PF) 100 MCG/2ML IJ SOLN
INTRAMUSCULAR | Status: AC
  Filled 2016-07-17: qty 2

## 2016-07-17 MED ORDER — MIDAZOLAM HCL 2 MG/2ML IJ SOLN
INTRAMUSCULAR | Status: AC
  Filled 2016-07-17: qty 2

## 2016-07-17 MED ORDER — GLUCAGON HCL RDNA (DIAGNOSTIC) 1 MG IJ SOLR
INTRAMUSCULAR | Status: AC | PRN
  Administered 2016-07-17: 1 mg via INTRAVENOUS

## 2016-07-17 MED ORDER — FENTANYL CITRATE (PF) 100 MCG/2ML IJ SOLN
INTRAMUSCULAR | Status: AC | PRN
  Administered 2016-07-17 (×3): 50 ug via INTRAVENOUS

## 2016-07-17 MED ORDER — LIDOCAINE HCL 1 % IJ SOLN
INTRAMUSCULAR | Status: AC | PRN
  Administered 2016-07-17: 15 mL

## 2016-07-17 MED ORDER — IOPAMIDOL (ISOVUE-300) INJECTION 61%
INTRAVENOUS | Status: AC
  Administered 2016-07-17: 20 mL
  Filled 2016-07-17: qty 50

## 2016-07-17 MED ORDER — MIDAZOLAM HCL 2 MG/2ML IJ SOLN
INTRAMUSCULAR | Status: AC | PRN
  Administered 2016-07-17 (×3): 1 mg via INTRAVENOUS

## 2016-07-17 MED ORDER — SODIUM CHLORIDE 0.9 % IV SOLN
INTRAVENOUS | Status: AC | PRN
  Administered 2016-07-17: 75 mL/h via INTRAVENOUS

## 2016-07-17 MED ORDER — GLUCAGON HCL RDNA (DIAGNOSTIC) 1 MG IJ SOLR
INTRAMUSCULAR | Status: AC
  Filled 2016-07-17: qty 1

## 2016-07-17 MED ORDER — LIDOCAINE HCL 1 % IJ SOLN
INTRAMUSCULAR | Status: AC
  Filled 2016-07-17: qty 20

## 2016-07-17 NOTE — Progress Notes (Deleted)
PULMONARY / CRITICAL CARE MEDICINE   Name: Angelica Beck MRN: [id number] DOB: November 18, 1950    ADMISSION DATE:  06/20/2016 CONSULTATION DATE:  06/20/2016  REFERRING MD:  EDP Dr Cathleen Fears   CHIEF COMPLAINT:  AMS  BRIEF   66 year old female with past medical history as below, which is significant for stroke with subsequent debilitation, diabetes, chronic kidney disease, chronic urinary tract infections due to incontinence, for which she takes chronic antibiotics. She lives at home with her daughter where she requires full assistance she is unable to truly ambulate and spends most of her time in a hospital bed/recliner. She relies on family members almost entirely for activities of daily living. She was in her usual state of health until about 9/23 when she developed nausea and vomiting. This persisted for 2 days until symptoms subsided, however, she continued to complain of abdominal pain. 9/26 when the patient's daughter got home from work the patient was found to be unresponsive. EMS was called and she was found hypoglycemic which was corrected, however, altered mental status did not improve. Upon arrival to the emergency department she continued to be lethargic and was found to be hypotensive with hypoxemia. Laboratory evaluation significant for potassium 7.4, bicarbonate less than 7, creatinine 6.51, BUN 95, WBC 12.3, lactic acid 13.88, pH 7.07.  STUDIES:  CT Head 9/27:  No acute abnormality. Chronic small vessel ischemic changes. Chronic lacunar infarct left basal ganglia & chronic ischemia right side of pons.  CT Abd/Pelvis w/o 9/27:  Early partial SBO w/ transition point LLQ with adhesions. Left humerus soft tissue swelling & intramuscular gas. Punctate hepatic granulomas noted.  Left Arm X-ray 9/27:  Diffuse soft tissue edema.  TTE 9/27:  LV w/ EF 60-65%. Grade 1 Diastolic dysfunction. No regional wall motion abnormalities. LA & RA normal in size. RV normal in size & function. Mild AS w/o AR.  Mild MR w/o MS.  ABIs 10/3: R normal. L posterior artery noncompressible; suspect calcification.  CT head 10/3: No acute infarct, no hemorrhage or mass. Atrophy evident. Similar to prior study.   MICROBIOLOGY: MRSA PCR 9/27:  Negative Blood Ctx x 2 9/26: Coag Neg Staph 1/2 Urine Ctx 9/26:  5000 CFU E coli & 1000 CFU Klebsiella  C diff PCR 10/10:  Toxin & Antigen Positive Trach Aspirate 10/20>> few E coli, R amp and Bactrim, Intermediate sens to Unasyn, S to all others  ANTIBIOTICS: Levaquin 9/26 - 9/28 Aztreonam 9/26 - 9/27 Vancomycin 9/26 - off Merrem 9/27 - 10/3 PO Vancomycin 10/10 >>10/15 ( Lost po access) 10/16>> Flagyl IV 10/15 (while NGT is out) > 10/16 Cipro 10/22>>  SIGNIFICANT EVENTS: 9/26 - admit for presumed septic shock 10/14 - extubated 10/16 - reintubated 10/18 - HD perm cath placed  LINES/TUBES: OETT 7.5 9/26 - 10/14, OETT 10/16 >>10/20, TRACH 10/20 (ENT Dr Redmond Baseman)  OGT 9/26 - 10/14 OGT 10/16 >> L IJ CVL 9/26 >> Foley 9/26 >> 10/8 RIJ HD 9/30 >> 10/18 Rectal pouch 10/17 > 10/18 NG tube 10/17 >> R HD Perm Cath 10/18 >>    SUBJECTIVE/OVERNIGHT/INTERVAL HX 07/15/16 - not on sedation - nods to answers . Can squeeze hands on command and wiggle toes. Trach site midline and intact.     VITAL SIGNS: BP 135/80   Pulse 90   Temp 99.8 F (37.7 C) (Oral)   Resp 15   Ht '5\' 6"'$  (1.676 m)   Wt 214 lb 4.6 oz (97.2 kg)   SpO2 100%   BMI 34.59 kg/m  HEMODYNAMICS:    VENTILATOR SETTINGS: Vent Mode: PRVC FiO2 (%):  [30 %] 30 % Set Rate:  [15 bmp] 15 bmp Vt Set:  [470 mL] 470 mL PEEP:  [5 cmH20] 5 cmH20 Pressure Support:  [15 cmH20] 15 cmH20 Plateau Pressure:  [19 cmH20-20 cmH20] 20 cmH20  INTAKE / OUTPUT: I/O last 3 completed shifts: In: 690 [I.V.:10; NG/GT:480; IV Piggyback:200] Out: 0   PHYSICAL EXAMINATION: General:  Obese female. . No family at bedside. S/p trach, for PEG placement 10/23 Neuro: Nods appropriately. Tracks to voice. wigggles  toes, squeezes hands HEENT:  No scleral icterus. Goiter noted. Moving neck well.  Cardiovascular:  Regular rhythm. Ansarca persists but is improving. 1+ PE to knees bilaterally Pulmonary: Normal work of breathing. Good aeration bilaterally.Few wheezes. Full vent  Support 10/23 am ( PEG placement today) Abdomen:  Soft. Protuberant. Normoactive bowel sounds. Musculoskeletal:  No joint deformity or effusion. Lifting head off of bed. Skin:  Warm & dry. No rash on exposed skin.  LABS:  PULMONARY  Recent Labs Lab 07/10/16 1229 07/10/16 1432 07/11/16 0817  PHART 7.324* 7.391 7.358  PCO2ART 38.6 33.4 36.5  PO2ART 121* 240.0* 119*  HCO3 19.5* 20.5 20.0  TCO2  --  22  --   O2SAT 98.0 100.0 97.8    CBC  Recent Labs Lab 07/15/16 0440 07/16/16 0300 07/17/16 0345  HGB 6.7* 7.3* 7.5*  HCT 21.0* 22.6* 23.4*  WBC 27.2* 31.9* 25.3*  PLT 374 361 363    COAGULATION  Recent Labs Lab 07/10/16 1300 07/11/16 0400 07/17/16 0345  INR 1.16 1.32 1.33    CARDIAC  No results for input(s): TROPONINI in the last 168 hours. No results for input(s): PROBNP in the last 168 hours.   CHEMISTRY  Recent Labs Lab 07/12/16 0506 07/13/16 0406 07/13/16 0652 07/14/16 0430 07/15/16 0440 07/16/16 0300 07/17/16 0345  NA 135  --  135 136 134* 138 139  K 3.0*  --  4.0 3.3* 3.7 3.5 3.9  CL 99*  --  102 99* 98* 100* 100*  CO2 25  --  '24 27 26 28 28  '$ GLUCOSE 92  --  119* 169* 198* 153* 158*  BUN 58*  --  66* 23* 30* 14 28*  CREATININE 3.32*  --  3.98* 2.31* 3.17* 2.38* 3.40*  CALCIUM 8.6*  --  8.3* 8.3* 8.1* 8.5* 8.9  MG 1.8 1.9  --  1.8 2.1 2.0 2.1  PHOS 4.6  --  5.3*  --  4.7* 3.0 4.0   Estimated Creatinine Clearance: 19.9 mL/min (by C-G formula based on SCr of 3.4 mg/dL (H)).   LIVER  Recent Labs Lab 07/10/16 1300  07/11/16 0400 07/12/16 0506 07/13/16 0652 07/14/16 0430 07/15/16 0440 07/16/16 0300 07/17/16 0345  AST  --   --   --  51*  --  38  --   --   --   ALT  --   --    --  50  --  40  --   --   --   ALKPHOS  --   --   --  228*  --  177*  --   --   --   BILITOT  --   --   --  0.3  --  0.5  --   --   --   PROT  --   --   --  5.7*  --  5.7*  --   --   --   ALBUMIN  --   < >  1.5* 1.5* 1.6* 1.5* 1.5* 1.5* 1.5*  INR 1.16  --  1.32  --   --   --   --   --  1.33  < > = values in this interval not displayed.   INFECTIOUS  Recent Labs Lab 07/14/16 0430 07/15/16 0440 07/16/16 0300 07/17/16 0345  LATICACIDVEN  --  0.8 0.9 0.7  PROCALCITON 1.92 1.82  --   --      ENDOCRINE CBG (last 3)   Recent Labs  07/16/16 2356 07/17/16 0352 07/17/16 0841  GLUCAP 149* 148* 168*      IMAGING x48h  - image(s) personally visualized  -   highlighted in bold No results found.   DISCUSSION: 65 y.o. female with T2DM, CKD, recurrent UTIs 2/2 incontinence, and stroke with subsequent severe debilitation (Left sided weakness; ECOG 4), who presented with AMS and found to be in shock, likely 2/2 UTI sepsis. Some improvement in level of alertness with CRRT. Completed 7-day course of meropenem 10/3. Extubated 10/14 but requiring intermittent noninvasive positive pressure ventilation. Also being treated for C diff, on day 9 of PO vancomycin. Tracheostomy by ENT 10/20.  Plan for G-tube placement by IR 10/23.   ASSESSMENT / PLAN:  PULMONARY A: Acute on chronic Hypoxic Respiratory Failure - inability to protect airway with severe deconditioning and weakness and OHS s/p ENT Trach 07/14/16  P:   Full PRVC support Tracheal suctioning Monitor for changes in secretions Incentive Spirometry  Weaning FiO2 for sat >92% BD's prn Intermittent CXR's  CARDIOVASCULAR A:  Shock - Likely sepsis. Cortisol 45.1. Resolved. H/O HTN & HLD  P:  Vitals per unit protocol Continuous telemetry monitoring Diuresis with HD TThSa  RENAL A:   Acute On Chronic Renal Failure Stage IV  - worsens without HD treatments Profound Metabolic Acidosis - Resolved w/ HD. Hyperkalemia - Resolved.   Lactic Acidosis - Resolved. Likely due to hypoperfusion in setting of Metformin.   Hyperphosphatemia - Secondary to acute renal failure.  Pseudohypocalcemia - Resolved.   P:   Appreciate Nephrology recommendations Dialysis TTS Trending electrolytes daily Replacing electrolytes as indicated Perm-a-cath placed 10/18, with previous HD cath removed  GASTROINTESTINAL A:   Partial SBO - Resolving on XR abd 9/30. Having BMs. H/O Ventral Hernia Repair. Possible Liver Cirrhosis. RUQ U/S normal. Ammonia WNL 10/5.  Transaminitis - Likely shock liver versus congestive hepatopathy. Improving.  Diarrhea - Secondary to C diff.    - On TF 07/15/16  P:   Continue TF Pepcid VT daily once replaced Trending LFTs Intermittently ( 10/24) Consulted IR about possible G-tube placement   HEMATOLOGIC A:   Anemia - Chronic. No signs of active bleeding. S/P 1u on 10/6 & 1u on 10/11. Hgb relatively stable. Leukocytosis - Likely sepsis. Down trending ( 25.3 on 10/23 from 31.9 on 10/22) Possible HIT - Ab 0.427. Unlikely with neg SRA. Thrombocytopenia - Resolved.   - hgb < 7gm% on 07/15/16 >>1 unit prbc  - hgb 7.5 on 10/23  P:  Trending cell counts daily w/ CBC Transfusion goal of 7.0 SCDs Heparin [place] q8hr Aspirin 162 mg Monitor for s/s bleeding  INFECTIOUS A:   Severe Sepsis  C Diff Colitis - Found 10/10 & started PO Vancocin. E.coli and Klebsiella UTI - S/P 7 days Merrem. Sacral wound  -ongoing C diff Rx 9:33 AM diarrhea 07/15/16 per RN  - Trach Aspirate 10/20>> few E coli, Resistant  amp and Bactrim, Intermediate sens to Unasyn,     Sensitive to all others  P:   PO/OGT Vancomycin Day #12 (Day #13 abx, received flagyl when temporarily lost NG access) 10/22/>> started Cipro for positive tracheal aspirate ( Few e-coli) Trend fever and WBC  Holding home Trimethoprim. Wound care consulted   ENDOCRINE A:   Hypoglycemia - Resolved with Dextrose IVF. H/O DM Type 2 - A1c 5.8. H/O  Hyperthyroidism - On PTU at home. TSH 0.331 & F T4 1.33 9/26. Repeat TSH 0.458 & FT4 1.78 on 10/14. TSH 0.286 10/18. Free T4 1.90   - nil acute P:   SSI per Sensitive Algorithm. Decrease levemir to 10 U daily from BID; hold when NPO 10/20 Holding PTU & Metformin. Increase methimazole from '5mg'$  to 10 mg VT daily Repeat TSH, free T4 ( 10/24)  NEUROLOGIC A:   Acute Encephalopathy - at admit Repeat CT head unchanged. Folate WNL. H/O CVA - Minimal mobility.  07/17/16 - nods approproiately .Significantly deconditioned +                   Tracks to voice, squeezing hands , wiggles toes.   P:   Avoid sedating meds  MUSCULOSKELETAL A: Edema Left Arm - Improved. Secondary to IV infiltration. No gas on X-ray 9/27.  P: Continue to monitor   GLOBAL Very deconditioned. Chronic critically ill  P Support PT/ OT as possilbe    FAMILY  - Updates: Spoke with daughter 10/19 about continued plan for tracheostomy 10/20 and likely g-tube placement 10/23.  No family at bedside 10/23  - Inter-disciplinary family meet or Palliative Care meeting due by: Continued [place] discussion 10/6 with daughter Angelica Beck. Wants aggressive treatment.    Magdalen Spatz, AGACNP-BC [facility] Pulmonary/Critical Care Medicine Pager # [phone]  07/17/2016 8:53 AM

## 2016-07-17 NOTE — Sedation Documentation (Signed)
Prep being completed, OG down by Michela Pitcher, RT

## 2016-07-17 NOTE — Sedation Documentation (Signed)
Tolerated well.  ICU RN present stayed in control room for procedure.  REport given, TX back to floor.

## 2016-07-17 NOTE — Progress Notes (Signed)
PULMONARY / CRITICAL CARE MEDICINE   Name: Angelica Beck MRN: [id number] DOB: 05-13-1951    ADMISSION DATE:  06/20/2016 CONSULTATION DATE:  06/20/2016  REFERRING MD:  EDP Dr Cathleen Fears   CHIEF COMPLAINT:  AMS  BRIEF   66 year old female with past medical history as below, which is significant for stroke with subsequent debilitation, diabetes, chronic kidney disease, chronic urinary tract infections due to incontinence, for which she takes chronic antibiotics. She lives at home with her daughter where she requires full assistance she is unable to truly ambulate and spends most of her time in a hospital bed/recliner. She relies on family members almost entirely for activities of daily living. She was in her usual state of health until about 9/23 when she developed nausea and vomiting. This persisted for 2 days until symptoms subsided, however, she continued to complain of abdominal pain. 9/26 when the patient's daughter got home from work the patient was found to be unresponsive. EMS was called and she was found hypoglycemic which was corrected, however, altered mental status did not improve. Upon arrival to the emergency department she continued to be lethargic and was found to be hypotensive with hypoxemia. Laboratory evaluation significant for potassium 7.4, bicarbonate less than 7, creatinine 6.51, BUN 95, WBC 12.3, lactic acid 13.88, pH 7.07.  STUDIES:  CT Head 9/27:  No acute abnormality. Chronic small vessel ischemic changes. Chronic lacunar infarct left basal ganglia & chronic ischemia right side of pons.  CT Abd/Pelvis w/o 9/27:  Early partial SBO w/ transition point LLQ with adhesions. Left humerus soft tissue swelling & intramuscular gas. Punctate hepatic granulomas noted.  Left Arm X-ray 9/27:  Diffuse soft tissue edema.  TTE 9/27:  LV w/ EF 60-65%. Grade 1 Diastolic dysfunction. No regional wall motion abnormalities. LA & RA normal in size. RV normal in size & function. Mild AS w/o AR.  Mild MR w/o MS.  ABIs 10/3: R normal. L posterior artery noncompressible; suspect calcification.  CT head 10/3: No acute infarct, no hemorrhage or mass. Atrophy evident. Similar to prior study.   MICROBIOLOGY: MRSA PCR 9/27:  Negative Blood Ctx x 2 9/26: Coag Neg Staph 1/2 Urine Ctx 9/26:  5000 CFU E coli & 1000 CFU Klebsiella  C diff PCR 10/10:  Toxin & Antigen Positive BC 10/20: E.coli  ANTIBIOTICS: Levaquin 9/26 - 9/28 Aztreonam 9/26 - 9/27 Vancomycin 9/26 - off Merrem 9/27 - 10/3 PO Vancomycin 10/10 >>\ Flagyl IV 10/15 (while NGT is out) > 10/16 Cefepime 10/22>>  SIGNIFICANT EVENTS: 9/26 - admit for presumed septic shock 10/14 - extubated 10/16 - reintubated 10/18 - HD perm cath placed  LINES/TUBES: OETT 7.5 9/26 - 10/14, OETT 10/16 >>10/20, TRACH 10/20 (ENT Dr Redmond Baseman)  OGT 9/26 - 10/14 OGT 10/16 >> L IJ CVL 9/26 >> Foley 9/26 >> 10/8 RIJ HD 9/30 >> 10/18 Rectal pouch 10/17 > 10/18 NG tube 10/17 >> R HD Perm Cath 10/18 >>    SUBJECTIVE/OVERNIGHT/INTERVAL HX No events overnight, trach in good position, on full vent support.  VITAL SIGNS: BP (!) 149/74   Pulse 78   Temp 99 F (37.2 C) (Axillary)   Resp 15   Ht '5\' 6"'$  (1.676 m)   Wt 97.2 kg (214 lb 4.6 oz)   SpO2 99%   BMI 34.59 kg/m   HEMODYNAMICS:    VENTILATOR SETTINGS: Vent Mode: PRVC FiO2 (%):  [30 %-100 %] 100 % Set Rate:  [15 bmp] 15 bmp Vt Set:  [470 mL] 470 mL PEEP:  [  [place] Pressure:  [19 cmH20-21 cmH20] 21 cmH20  INTAKE / OUTPUT: I/O last 3 completed shifts: In: 690 [I.V.:10; NG/GT:480; IV Piggyback:200] Out: 0   PHYSICAL EXAMINATION: General:  Obese female. . No family at bedside. S/p trach Neuro: Nods appropriately. Tracks to voice. HEENT:  No scleral icterus. Trach site clean Cardiovascular:  Regular rhythm.  1+ PE to knees bilaterally. Pulmonary: Normal work of breathing. Good aeration bilaterally. Few rhonchi  Abdomen:  Soft. Protuberant. Normoactive bowel  sounds. Musculoskeletal:  No joint deformity or effusion. Lifting head off of bed. Skin:  Warm & dry. No rash on exposed skin.  LABS:  PULMONARY  Recent Labs Lab 07/11/16 0817  PHART 7.358  PCO2ART 36.5  PO2ART 119*  HCO3 20.0  O2SAT 97.8   CBC  Recent Labs Lab 07/15/16 0440 07/16/16 0300 07/17/16 0345  HGB 6.7* 7.3* 7.5*  HCT 21.0* 22.6* 23.4*  WBC 27.2* 31.9* 25.3*  PLT 374 361 363   COAGULATION  Recent Labs Lab 07/11/16 0400 07/17/16 0345  INR 1.32 1.33   CARDIAC  No results for input(s): TROPONINI in the last 168 hours. No results for input(s): PROBNP in the last 168 hours.  CHEMISTRY  Recent Labs Lab 07/12/16 0506 07/13/16 0406 07/13/16 0652 07/14/16 0430 07/15/16 0440 07/16/16 0300 07/17/16 0345  NA 135  --  135 136 134* 138 139  K 3.0*  --  4.0 3.3* 3.7 3.5 3.9  CL 99*  --  102 99* 98* 100* 100*  CO2 25  --  '24 27 26 28 28  '$ GLUCOSE 92  --  119* 169* 198* 153* 158*  BUN 58*  --  66* 23* 30* 14 28*  CREATININE 3.32*  --  3.98* 2.31* 3.17* 2.38* 3.40*  CALCIUM 8.6*  --  8.3* 8.3* 8.1* 8.5* 8.9  MG 1.8 1.9  --  1.8 2.1 2.0 2.1  PHOS 4.6  --  5.3*  --  4.7* 3.0 4.0   Estimated Creatinine Clearance: 19.9 mL/min (by C-G formula based on SCr of 3.4 mg/dL (H)).  LIVER  Recent Labs Lab 07/11/16 0400 07/12/16 0506 07/13/16 0652 07/14/16 0430 07/15/16 0440 07/16/16 0300 07/17/16 0345  AST  --  51*  --  38  --   --   --   ALT  --  50  --  40  --   --   --   ALKPHOS  --  228*  --  177*  --   --   --   BILITOT  --  0.3  --  0.5  --   --   --   PROT  --  5.7*  --  5.7*  --   --   --   ALBUMIN 1.5* 1.5* 1.6* 1.5* 1.5* 1.5* 1.5*  INR 1.32  --   --   --   --   --  1.33   INFECTIOUS  Recent Labs Lab 07/14/16 0430 07/15/16 0440 07/16/16 0300 07/17/16 0345  LATICACIDVEN  --  0.8 0.9 0.7  PROCALCITON 1.92 1.82  --   --    ENDOCRINE CBG (last 3)   Recent Labs  07/17/16 0352 07/17/16 0841 07/17/16 1337  GLUCAP 148* 168* 228*    IMAGING x48h  - image(s) personally visualized  -   highlighted in bold I reviewed CXR myself, trach in good position  DISCUSSION: 65 y.o. female with T2DM, CKD, recurrent UTIs 2/2 incontinence, and stroke with subsequent severe debilitation (Left sided weakness; ECOG  4), who presented with AMS and found to be in shock, likely 2/2 UTI sepsis. Some improvement in level of alertness with CRRT. Completed 7-day course of meropenem 10/3. Extubated 10/14 but requiring intermittent noninvasive positive pressure ventilation. Also being treated for C diff, on day 9 of PO vancomycin. Tracheostomy by ENT 10/20. Plan for G-tube placement by IR 10/23.   ASSESSMENT / PLAN:  PULMONARY A: Acute on chronic Hypoxic Respiratory Failure - inability to protect airway with severe deconditioning and weakness and OHS s/p ENT Trach 07/14/16  P:   Full PRVC support Will begin weaning tomorrow after PEG is in. Tracheal suctioning as needed Weaning FiO2 for sat >92%  CARDIOVASCULAR A:  Shock - Likely sepsis. Cortisol 45.1. Resolved. H/O HTN & HLD  P:  Vitals per unit protocol Continuous telemetry monitoring Diuresis with HD TThSa  RENAL A:   Acute On Chronic Renal Failure Stage IV  - worsens without HD treatments Profound Metabolic Acidosis - Resolved w/ HD. Hyperkalemia - Resolved.  Lactic Acidosis - Resolved. Likely due to hypoperfusion in setting of Metformin.   Hyperphosphatemia - Secondary to acute renal failure.  Pseudohypocalcemia - Resolved.   P:   Appreciate Nephrology recommendations Dialysis TTS Trending electrolytes daily Replacing electrolytes as indicated Perm-a-cath placed 10/18, with previous HD cath removed  GASTROINTESTINAL A:   Partial SBO - Resolving on XR abd 9/30. Having BMs. H/O Ventral Hernia Repair. Possible Liver Cirrhosis. RUQ U/S normal. Ammonia WNL 10/5.  Transaminitis - Likely shock liver versus congestive hepatopathy. Improving.  Diarrhea - Secondary to C  diff.  Trach 10/23 via IR  P:   Restart TF post PEG per IR's recommendations Pepcid VT daily once replaced Trending LFTs Intermittently   HEMATOLOGIC A:   Anemia - Chronic. No signs of active bleeding. S/P 1u on 10/6 & 1u on 10/11. Hgb relatively stable. Leukocytosis - Likely sepsis. Resolving. 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 Unlikely with neg SRA. Thrombocytopenia - Resolved.  P:  Trending cell counts daily w/ CBC Transfusion goal of 7.0 SCDs Heparin [place] q8hr Aspirin 162 mg  INFECTIOUS A:   Severe Sepsis  C Diff Colitis - Found 10/10 & started PO Vancocin. E.coli and Klebsiella UTI - S/P 7 days Merrem. Sacral wound LLL pneumonia, WBC incr 10/22, sputum cx + E coli from 10/20 sens cipro  P:   PO/OGT Vancomycin Day #11 (Day #12 abx, received flagyl when temporarily lost NG access) Holding home Trimethoprim. Wound care consulted Started cipro 10/22>>  ENDOCRINE A:   Hypoglycemia - Resolved with Dextrose IVF. H/O DM Type 2 - A1c 5.8. H/O Hyperthyroidism - On PTU at home. TSH 0.331 & F T4 1.33 9/26. Repeat TSH 0.458 & FT4 1.78 on 10/14. TSH 0.286 10/18. Free T4 1.90  P:   SSI per Sensitive Algorithm. Decrease levemir to 10 U daily from BID; hold when NPO 10/23 for PEG, will restart 10/24 Holding PTU & Metformin. Increased methimazole from '5mg'$  to 10 mg VT daily  NEUROLOGIC A:   Acute Encephalopathy - at admit Repeat CT head unchanged. Folate WNL. H/O CVA - Minimal mobility.  P:   Avoid sedating meds PRN fentanyl for pain post procedure  MUSCULOSKELETAL A: Edema Left Arm - Improved. Secondary to IV infiltration. No gas on X-ray 9/27.  P: Continue to monitor  GLOBAL Very deconditioned. Chronic critically ill  P Support PT as possilbe  FAMILY  - Updates: No family bedside 10/24.  Plan on PEG placement on 10/23 then begin active weaning 10/24.  Transfer primary care to [facility] and PCCM will continue to follow for vent/trach care.  Rush Farmer, M.D. [facility]  Pulmonary/Critical Care Medicine. Pager: [phone]. After hours pager: [phone].  07/17/2016 2:38 PM

## 2016-07-17 NOTE — Progress Notes (Signed)
Patient transported to IR on vent.  Tolerated well, no apparent complications.

## 2016-07-17 NOTE — Procedures (Signed)
Successful placement of gastrostomy tube.  No immediate complication.  See full report in PACS.

## 2016-07-17 NOTE — Progress Notes (Signed)
Patient ID: Angelica Beck, female   DOB: [birth year], 65 y.o.   MRN: [id number] Progressing well, off ventilator. Trach functioning with no bleeding.  Removed vaseline gauze packing. S/p tracheostomy for respiratory failure and isthmusectomy for goiter Packing removed.  Will remove sutures in two days.

## 2016-07-18 ENCOUNTER — Inpatient Hospital Stay
Admission: AD | Admit: 2016-07-18 | Discharge: 2016-08-10 | Disposition: A | Discharge status: Other Acute Inpatient Hospital | Payer: Medicare Other | Source: Ambulatory Visit | Attending: Internal Medicine | Admitting: Internal Medicine

## 2016-07-18 ENCOUNTER — Other Ambulatory Visit (HOSPITAL_COMMUNITY): Payer: Self-pay

## 2016-07-18 ENCOUNTER — Inpatient Hospital Stay (HOSPITAL_COMMUNITY): Payer: Medicare Other

## 2016-07-18 DIAGNOSIS — I120 Hypertensive chronic kidney disease with stage 5 chronic kidney disease or end stage renal disease: Secondary | ICD-10-CM | POA: Diagnosis not present

## 2016-07-18 DIAGNOSIS — Z452 Encounter for adjustment and management of vascular access device: Secondary | ICD-10-CM | POA: Diagnosis not present

## 2016-07-18 DIAGNOSIS — N189 Chronic kidney disease, unspecified: Secondary | ICD-10-CM | POA: Diagnosis not present

## 2016-07-18 DIAGNOSIS — Z6832 Body mass index (BMI) 32.0-32.9, adult: Secondary | ICD-10-CM | POA: Diagnosis not present

## 2016-07-18 DIAGNOSIS — J9819 Other pulmonary collapse: Secondary | ICD-10-CM

## 2016-07-18 DIAGNOSIS — R498 Other voice and resonance disorders: Secondary | ICD-10-CM | POA: Diagnosis not present

## 2016-07-18 DIAGNOSIS — A419 Sepsis, unspecified organism: Secondary | ICD-10-CM | POA: Diagnosis not present

## 2016-07-18 DIAGNOSIS — Z931 Gastrostomy status: Secondary | ICD-10-CM | POA: Diagnosis not present

## 2016-07-18 DIAGNOSIS — Z930 Tracheostomy status: Secondary | ICD-10-CM | POA: Diagnosis not present

## 2016-07-18 DIAGNOSIS — E1169 Type 2 diabetes mellitus with other specified complication: Secondary | ICD-10-CM | POA: Diagnosis not present

## 2016-07-18 DIAGNOSIS — Z430 Encounter for attention to tracheostomy: Secondary | ICD-10-CM | POA: Diagnosis not present

## 2016-07-18 DIAGNOSIS — K5669 Other partial intestinal obstruction: Secondary | ICD-10-CM | POA: Diagnosis not present

## 2016-07-18 DIAGNOSIS — J155 Pneumonia due to Escherichia coli: Secondary | ICD-10-CM | POA: Diagnosis not present

## 2016-07-18 DIAGNOSIS — K56609 Unspecified intestinal obstruction, unspecified as to partial versus complete obstruction: Secondary | ICD-10-CM

## 2016-07-18 DIAGNOSIS — Z4682 Encounter for fitting and adjustment of non-vascular catheter: Secondary | ICD-10-CM | POA: Diagnosis not present

## 2016-07-18 DIAGNOSIS — M6281 Muscle weakness (generalized): Secondary | ICD-10-CM | POA: Diagnosis not present

## 2016-07-18 DIAGNOSIS — C3492 Malignant neoplasm of unspecified part of left bronchus or lung: Secondary | ICD-10-CM | POA: Diagnosis not present

## 2016-07-18 DIAGNOSIS — I10 Essential (primary) hypertension: Secondary | ICD-10-CM | POA: Diagnosis not present

## 2016-07-18 DIAGNOSIS — E1122 Type 2 diabetes mellitus with diabetic chronic kidney disease: Secondary | ICD-10-CM | POA: Diagnosis present

## 2016-07-18 DIAGNOSIS — Z880 Allergy status to penicillin: Secondary | ICD-10-CM | POA: Diagnosis not present

## 2016-07-18 DIAGNOSIS — J189 Pneumonia, unspecified organism: Secondary | ICD-10-CM | POA: Diagnosis not present

## 2016-07-18 DIAGNOSIS — A0472 Enterocolitis due to Clostridium difficile, not specified as recurrent: Secondary | ICD-10-CM | POA: Diagnosis not present

## 2016-07-18 DIAGNOSIS — G9340 Encephalopathy, unspecified: Secondary | ICD-10-CM | POA: Diagnosis not present

## 2016-07-18 DIAGNOSIS — E039 Hypothyroidism, unspecified: Secondary | ICD-10-CM | POA: Diagnosis present

## 2016-07-18 DIAGNOSIS — J9621 Acute and chronic respiratory failure with hypoxia: Secondary | ICD-10-CM | POA: Diagnosis not present

## 2016-07-18 DIAGNOSIS — J9811 Atelectasis: Secondary | ICD-10-CM | POA: Diagnosis not present

## 2016-07-18 DIAGNOSIS — Z9911 Dependence on respirator [ventilator] status: Secondary | ICD-10-CM | POA: Diagnosis not present

## 2016-07-18 DIAGNOSIS — N179 Acute kidney failure, unspecified: Secondary | ICD-10-CM | POA: Diagnosis not present

## 2016-07-18 DIAGNOSIS — J9610 Chronic respiratory failure, unspecified whether with hypoxia or hypercapnia: Secondary | ICD-10-CM | POA: Diagnosis not present

## 2016-07-18 DIAGNOSIS — J9690 Respiratory failure, unspecified, unspecified whether with hypoxia or hypercapnia: Secondary | ICD-10-CM

## 2016-07-18 DIAGNOSIS — D631 Anemia in chronic kidney disease: Secondary | ICD-10-CM | POA: Diagnosis not present

## 2016-07-18 DIAGNOSIS — R600 Localized edema: Secondary | ICD-10-CM | POA: Diagnosis not present

## 2016-07-18 DIAGNOSIS — N186 End stage renal disease: Secondary | ICD-10-CM | POA: Diagnosis present

## 2016-07-18 DIAGNOSIS — I110 Hypertensive heart disease with heart failure: Secondary | ICD-10-CM | POA: Diagnosis not present

## 2016-07-18 DIAGNOSIS — N19 Unspecified kidney failure: Secondary | ICD-10-CM | POA: Diagnosis not present

## 2016-07-18 DIAGNOSIS — J449 Chronic obstructive pulmonary disease, unspecified: Secondary | ICD-10-CM | POA: Diagnosis not present

## 2016-07-18 DIAGNOSIS — D638 Anemia in other chronic diseases classified elsewhere: Secondary | ICD-10-CM | POA: Diagnosis present

## 2016-07-18 DIAGNOSIS — D649 Anemia, unspecified: Secondary | ICD-10-CM | POA: Diagnosis not present

## 2016-07-18 DIAGNOSIS — R509 Fever, unspecified: Secondary | ICD-10-CM | POA: Diagnosis not present

## 2016-07-18 DIAGNOSIS — R1310 Dysphagia, unspecified: Secondary | ICD-10-CM | POA: Diagnosis not present

## 2016-07-18 DIAGNOSIS — R1312 Dysphagia, oropharyngeal phase: Secondary | ICD-10-CM | POA: Diagnosis not present

## 2016-07-18 DIAGNOSIS — E119 Type 2 diabetes mellitus without complications: Secondary | ICD-10-CM | POA: Diagnosis present

## 2016-07-18 DIAGNOSIS — R918 Other nonspecific abnormal finding of lung field: Secondary | ICD-10-CM | POA: Diagnosis not present

## 2016-07-18 DIAGNOSIS — R112 Nausea with vomiting, unspecified: Secondary | ICD-10-CM

## 2016-07-18 DIAGNOSIS — Z8719 Personal history of other diseases of the digestive system: Secondary | ICD-10-CM | POA: Diagnosis not present

## 2016-07-18 DIAGNOSIS — J9600 Acute respiratory failure, unspecified whether with hypoxia or hypercapnia: Secondary | ICD-10-CM | POA: Diagnosis not present

## 2016-07-18 DIAGNOSIS — R109 Unspecified abdominal pain: Secondary | ICD-10-CM | POA: Diagnosis not present

## 2016-07-18 DIAGNOSIS — I5030 Unspecified diastolic (congestive) heart failure: Secondary | ICD-10-CM | POA: Diagnosis not present

## 2016-07-18 DIAGNOSIS — Z794 Long term (current) use of insulin: Secondary | ICD-10-CM | POA: Diagnosis not present

## 2016-07-18 DIAGNOSIS — J9620 Acute and chronic respiratory failure, unspecified whether with hypoxia or hypercapnia: Secondary | ICD-10-CM | POA: Diagnosis not present

## 2016-07-18 DIAGNOSIS — K567 Ileus, unspecified: Secondary | ICD-10-CM

## 2016-07-18 DIAGNOSIS — B961 Klebsiella pneumoniae [K. pneumoniae] as the cause of diseases classified elsewhere: Secondary | ICD-10-CM | POA: Diagnosis not present

## 2016-07-18 DIAGNOSIS — N183 Chronic kidney disease, stage 3 (moderate): Secondary | ICD-10-CM | POA: Diagnosis not present

## 2016-07-18 DIAGNOSIS — R0602 Shortness of breath: Secondary | ICD-10-CM | POA: Diagnosis not present

## 2016-07-18 DIAGNOSIS — I5040 Unspecified combined systolic (congestive) and diastolic (congestive) heart failure: Secondary | ICD-10-CM | POA: Diagnosis not present

## 2016-07-18 DIAGNOSIS — E46 Unspecified protein-calorie malnutrition: Secondary | ICD-10-CM | POA: Diagnosis not present

## 2016-07-18 DIAGNOSIS — K56699 Other intestinal obstruction unspecified as to partial versus complete obstruction: Secondary | ICD-10-CM | POA: Diagnosis not present

## 2016-07-18 DIAGNOSIS — E872 Acidosis: Secondary | ICD-10-CM | POA: Diagnosis not present

## 2016-07-18 DIAGNOSIS — R488 Other symbolic dysfunctions: Secondary | ICD-10-CM | POA: Diagnosis not present

## 2016-07-18 DIAGNOSIS — J159 Unspecified bacterial pneumonia: Secondary | ICD-10-CM | POA: Diagnosis not present

## 2016-07-18 DIAGNOSIS — E1129 Type 2 diabetes mellitus with other diabetic kidney complication: Secondary | ICD-10-CM | POA: Diagnosis not present

## 2016-07-18 DIAGNOSIS — B9620 Unspecified Escherichia coli [E. coli] as the cause of diseases classified elsewhere: Secondary | ICD-10-CM | POA: Diagnosis present

## 2016-07-18 DIAGNOSIS — E876 Hypokalemia: Secondary | ICD-10-CM | POA: Diagnosis not present

## 2016-07-18 DIAGNOSIS — E662 Morbid (severe) obesity with alveolar hypoventilation: Secondary | ICD-10-CM | POA: Diagnosis not present

## 2016-07-18 DIAGNOSIS — N390 Urinary tract infection, site not specified: Secondary | ICD-10-CM | POA: Diagnosis not present

## 2016-07-18 DIAGNOSIS — Z4901 Encounter for fitting and adjustment of extracorporeal dialysis catheter: Secondary | ICD-10-CM | POA: Diagnosis not present

## 2016-07-18 LAB — COMPREHENSIVE METABOLIC PANEL
ALBUMIN: 1.5 g/dL — AB (ref 3.5–5.0)
ALK PHOS: 151 U/L — AB (ref 38–126)
ALT: 24 U/L (ref 14–54)
AST: 24 U/L (ref 15–41)
Anion gap: 10 (ref 5–15)
BILIRUBIN TOTAL: 0.4 mg/dL (ref 0.3–1.2)
BUN: 38 mg/dL — AB (ref 6–20)
CALCIUM: 8.8 mg/dL — AB (ref 8.9–10.3)
CO2: 27 mmol/L (ref 22–32)
Chloride: 98 mmol/L — ABNORMAL LOW (ref 101–111)
Creatinine, Ser: 4.13 mg/dL — ABNORMAL HIGH (ref 0.44–1.00)
GFR calc Af Amer: 12 mL/min — ABNORMAL LOW (ref >60)
GFR calc non Af Amer: 11 mL/min — ABNORMAL LOW (ref >60)
GLUCOSE: 130 mg/dL — AB (ref 65–99)
Potassium: 4.2 mmol/L (ref 3.5–5.1)
Sodium: 135 mmol/L (ref 135–145)
TOTAL PROTEIN: 5.9 g/dL — AB (ref 6.5–8.1)

## 2016-07-18 LAB — CBC WITH DIFFERENTIAL/PLATELET
BASOS ABS: 0.1 10*3/uL (ref 0.0–0.1)
Basophils Relative: 0 %
Eosinophils Absolute: 0.2 10*3/uL (ref 0.0–0.7)
Eosinophils Relative: 1 %
HEMATOCRIT: 23.4 % — AB (ref 36.0–46.0)
HEMOGLOBIN: 7.4 g/dL — AB (ref 12.0–15.0)
LYMPHS PCT: 7 %
Lymphs Abs: 1.3 10*3/uL (ref 0.7–4.0)
MCH: 28.8 pg (ref 26.0–34.0)
MCHC: 31.6 g/dL (ref 30.0–36.0)
MCV: 91.1 fL (ref 78.0–100.0)
Monocytes Absolute: 1.1 10*3/uL — ABNORMAL HIGH (ref 0.1–1.0)
Monocytes Relative: 6 %
NEUTROS ABS: 17.1 10*3/uL — AB (ref 1.7–7.7)
NEUTROS PCT: 86 %
Platelets: 316 10*3/uL (ref 150–400)
RBC: 2.57 MIL/uL — AB (ref 3.87–5.11)
RDW: 16.1 % — ABNORMAL HIGH (ref 11.5–15.5)
WBC: 19.8 10*3/uL — AB (ref 4.0–10.5)

## 2016-07-18 LAB — T4, FREE: Free T4: 1.87 ng/dL — ABNORMAL HIGH (ref 0.61–1.12)

## 2016-07-18 LAB — GLUCOSE, CAPILLARY
GLUCOSE-CAPILLARY: 120 mg/dL — AB (ref 65–99)
GLUCOSE-CAPILLARY: 126 mg/dL — AB (ref 65–99)
GLUCOSE-CAPILLARY: 135 mg/dL — AB (ref 65–99)
Glucose-Capillary: 148 mg/dL — ABNORMAL HIGH (ref 65–99)
Glucose-Capillary: 111 mg/dL — ABNORMAL HIGH (ref 65–99)

## 2016-07-18 LAB — PHOSPHORUS: Phosphorus: 4.8 mg/dL — ABNORMAL HIGH (ref 2.5–4.6)

## 2016-07-18 LAB — TSH: TSH: 0.042 u[IU]/mL — ABNORMAL LOW (ref 0.350–4.500)

## 2016-07-18 LAB — MAGNESIUM: Magnesium: 2 mg/dL (ref 1.7–2.4)

## 2016-07-18 MED ORDER — METOPROLOL TARTRATE 25 MG/10 ML ORAL SUSPENSION: 25.0000 mg | Freq: Two times a day (BID) | ORAL | Status: DC

## 2016-07-18 MED ORDER — FAMOTIDINE 20 MG PO TABS: 20.0000 mg | ORAL_TABLET | Freq: Every day | ORAL | Status: DC

## 2016-07-18 MED ORDER — INSULIN ASPART 100 UNIT/ML ~~LOC~~ SOLN: SUBCUTANEOUS | 11 refills | Status: DC

## 2016-07-18 MED ORDER — ALBUTEROL SULFATE (2.5 MG/3ML) 0.083% IN NEBU: 2.5000 mg | INHALATION_SOLUTION | RESPIRATORY_TRACT | 12 refills | Status: DC | PRN

## 2016-07-18 MED ORDER — ORAL CARE MOUTH RINSE: 15.0000 mL | Freq: Four times a day (QID) | OROMUCOSAL | 0 refills | Status: DC

## 2016-07-18 MED ORDER — DIATRIZOATE MEGLUMINE & SODIUM 66-10 % PO SOLN
ORAL | Status: AC
  Filled 2016-07-18: qty 30

## 2016-07-18 MED ORDER — HEPARIN SODIUM (PORCINE) 5000 UNIT/ML IJ SOLN: 5000.0000 [IU] | Freq: Three times a day (TID) | INTRAMUSCULAR | Status: DC

## 2016-07-18 MED ORDER — NEPRO/CARBSTEADY PO LIQD: 1000.0000 mL | ORAL | 0 refills | Status: DC

## 2016-07-18 MED ORDER — CHLORHEXIDINE GLUCONATE 0.12% ORAL RINSE (MEDLINE KIT): 15.0000 mL | Freq: Two times a day (BID) | OROMUCOSAL | 0 refills | Status: DC

## 2016-07-18 MED ORDER — VANCOMYCIN 50 MG/ML ORAL SOLUTION: 500.0000 mg | Freq: Four times a day (QID) | ORAL | Status: DC

## 2016-07-18 MED ORDER — DIATRIZOATE MEGLUMINE & SODIUM 66-10 % PO SOLN: 30.0000 mL | Freq: Once | ORAL | Status: DC

## 2016-07-18 MED ORDER — CIPROFLOXACIN IN D5W 400 MG/200ML IV SOLN: 400.0000 mg | INTRAVENOUS | Status: DC

## 2016-07-18 MED ORDER — ASPIRIN 81 MG PO CHEW: 162.0000 mg | CHEWABLE_TABLET | Freq: Every day | ORAL | Status: DC

## 2016-07-18 MED ORDER — METHIMAZOLE 10 MG PO TABS
10.0000 mg | ORAL_TABLET | Freq: Every day | ORAL | Status: DC
Start: 2016-07-19 — End: 2016-12-26

## 2016-07-18 MED ORDER — DARBEPOETIN ALFA 100 MCG/0.5ML IJ SOSY: 100.0000 ug | PREFILLED_SYRINGE | INTRAMUSCULAR | Status: DC

## 2016-07-18 NOTE — Care Management Important Message (Signed)
Important Message  Patient Details  Name: Angelica Beck MRN: [id number] Date of Birth: 1950/09/28   Medicare Important Message Given:  Yes    Clearance Chenault T, RN 07/18/2016, 11:53 AM

## 2016-07-18 NOTE — Discharge Summary (Signed)
Physician Discharge Summary  Patient ID: Angelica Beck MRN: [id number] DOB/AGE: [birth year] 65 y.o.  Admit date: 06/20/2016 Discharge date: 07/18/2016    Discharge Diagnoses:  Acute Encephalopathy secondary to Urosepsis History of CVA Chronic Debilitation  E-Coli & Klebsiella UTI  Acute on Chronic Renal Failure secondary to sepsis Metabolic Acidosis - secondary to above Hyperkalemia  Lactic Acidosis - in setting of metformin and hypoperfusion with sepsis Hyperphosphatemia  Pseudohypocalcemia  Septic Shock History Hypertension  Hyperlipidemia  Acute on Chronic Hypoxic Respiratory Failure status post Tracheostomy (10/20 per Dr. Redmond Baseman / ENT) Inability to Protect Airway  LLL Pneumonia  Obesity Hypoventilation Syndrome Partial Small Bowel Obstruction - resolved History of Ventral Hernia Repair  Possible Liver Cirrhosis  Transaminitis - likely shock liver versus congestive hepatopathy  Clostridium Difficile Diarrhea Status Post PEG (10/23 per IR) Anemia - HIT ruled out  Thrombocytopenia   Hypoglycemia  Diabetes Mellitus  Hyperthyroidism  Edema - left arm, improved, secondary to IV infiltration                                                                       DISCHARGE PLAN BY DIAGNOSIS     Acute Encephalopathy secondary to Urosepsis History of CVA Chronic Debilitation Sacral Wound   Discharge Plan: Avoid sedating medications  Physical therapy  Wound therapy per [facility] RN   E-Coli & Klebsiella UTI - treated initially with 7 days Merrem Acute on Chronic Renal Failure secondary to sepsis - HD intermittent during admission Metabolic Acidosis - secondary to above Hyperkalemia  Lactic Acidosis - in setting of metformin and hypoperfusion with sepsis Hyperphosphatemia  Pseudohypocalcemia   Discharge Plan: Will need Nephrology assessment at [facility].  Likely will need HD on 10/25 Trend BMP  Replace electrolytes as indicated Perm catheter care per protocol    Septic Shock History Hypertension  Hyperlipidemia   Discharge Plan: Monitor BP trend  Resume home cholesterol therapy  Continue ASA   Acute on Chronic Hypoxic Respiratory Failure status post Tracheostomy (10/20 per Dr. Redmond Baseman / ENT) Inability to Protect Airway  LLL Pneumonia / E-Coli  Obesity Hypoventilation Syndrome  Discharge Plan: Trach care per protocol  Clip trach sutures 10 days post insertion (10/23) ENT follow up post discharge for trach care PRVC 8 cc/kg  Wean PEEP / FiO2 for sats > 92% Will likely need nocturnal vent support if able to come off during the day.  High risk for recurrent atelectasis/PNA in the setting of debility / chronic illness  Complete 7 days cipro, start date 10/22   Partial Small Bowel Obstruction - resolved History of Ventral Hernia Repair  Possible Liver Cirrhosis  Transaminitis - likely shock liver versus congestive hepatopathy  Clostridium Difficile Diarrhea Status Post PEG (10/23 per IR)  Discharge Plan: [place] TF  Recommend Nutrition to follow  Continue PO vancomycin for C-Diff, started 07/04/16 PEG care per protocol  Pepcid for SUP  Anemia - HIT ruled out  Thrombocytopenia   Discharge Plan: Trend CBC  Hypoglycemia  Diabetes Mellitus  Hyperthyroidism   Discharge Plan: Continue Methimazole (liver dysfunction).  Would not restart PTU.  Monitor glucose  CBG with SSI   Edema - left arm, improved, secondary to IV infiltration   Discharge Plan: Monitor site Warm compress / elevation  DISCHARGE SUMMARY   Angelica Beck is a 66 y.o. 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 Prior to admit, she lived at home with her daughter where she requires full assistance she is unable to ambulate and spends most of her time in a hospital  bed/recliner. She relies on family members almost entirely for activities of daily living. She was in her usual state of health until about 9/23 when she developed nausea and vomiting. This persisted for 2 days until symptoms subsided, however, she continued to complain of abdominal pain. 9/26 when the patient's daughter got home from work the patient was found to be unresponsive. EMS was called and she was found hypoglycemic which was corrected, however, altered mental status did not improve. Upon arrival to the emergency department she continued to be lethargic and was found to be hypotensive with hypoxemia. Laboratory evaluation significant for potassium 7.4, bicarbonate less than 7, creatinine 6.51, BUN 95, WBC 12.3, lactic acid 13.88, pH 7.07.  The patient was admitted for septic shock secondary to UTI.  She required CVVHD in the setting of acute on chronic renal failure.  Last HD completed 10/21.  Mental status improved with CVVHD.  Initial urine culture was positive for klebsiella and e-coli.  She was treated with 7 days of merrem.  She was extubated 10/14 but requiring intermittent noninvasive positive pressure ventilation. She failed extubation and was re-intubated 10/16.  Course complicated by SBO (now resolved). She was found to be positive for C diff & treated with PO vancomycin. Due to poor weaning, she had a tracheostomy placed by ENT 10/20 & G-tube placement by IR on 10/23. Sputum culture grew e-coli and she was treated with Cipro.  The patient was medically cleared for discharge to [facility] on 10/24 with plans as above.   STUDIES:  CT Head 9/27 >>  No acute abnormality. Chronic small vessel ischemic changes. Chronic lacunar infarct left basal ganglia & chronic ischemia right side of pons.  CT Abd/Pelvis w/o 9/27 >> Early partial SBO w/ transition point LLQ with adhesions. Left humerus soft tissue swelling & intramuscular gas. Punctate hepatic granulomas noted.  Left Arm X-ray 9/27 >>  Diffuse soft  tissue edema.  TTE 9/27 >>  LV w/ EF 60-65%. Grade 1 Diastolic dysfunction. No regional wall motion abnormalities. LA & RA normal in size. RV normal in size & function. Mild AS w/o AR. Mild MR w/o MS.  ABIs 10/3 >> R normal. L posterior artery noncompressible; suspect calcification.  CT head 10/3 >> No acute infarct, no hemorrhage or mass. Atrophy evident. Similar to prior study.   MICROBIOLOGY: MRSA PCR 9/27 >>  Negative Blood Ctx x 2 9/26 >> Coag Neg Staph 1/2 Urine Ctx 9/26 >> 5000 CFU E coli & 1000 CFU Klebsiella  C diff PCR 10/10 >>  Toxin & Antigen Positive Sputum 10/20 >> e-coli, sens CIPRO BC 10/20 >> E.coli  ANTIBIOTICS: Levaquin 9/26 - 9/28 Aztreonam 9/26 - 9/27 Vancomycin 9/26 - off Merrem 9/27 - 10/3 PO Vancomycin 10/10 >> Flagyl IV 10/15 (while NGT is out) >> 10/16 Cipro 10/22 >>  SIGNIFICANT EVENTS: 09/26 - Admit for presumed septic shock 10/14 - extubated 10/16 - reintubated 10/18 - HD perm cath placed 10/20 - Trach per Dr. Redmond Baseman 10/23 - PEG per IR   LINES/TUBES: OETT 7.5 9/26 - 10/14, OETT 10/16 >>10/20, [facility] 10/20 (ENT Dr  Bates)  OGT 9/26 - 10/14 OGT 10/16 >> L IJ CVL 9/26 >> Foley 9/26 >> 10/8 RIJ HD 9/30 >> 10/18 Rectal pouch 10/17 > 10/18 NG tube 10/17 >> R HD Perm Cath 10/18 >>  Discharge Exam: General:  Obese female. . No family at bedside. S/p trach Neuro: Nods appropriately. Tracks to voice. HEENT:  No scleral icterus. Trach site clean Cardiovascular:  Regular rhythm.  1+ PE to knees bilaterally. Pulmonary: Normal work of breathing. Good aeration bilaterally. Few rhonchi  Abdomen:  Soft. Protuberant. Normoactive bowel sounds. Musculoskeletal:  No joint deformity or effusion. Lifting head off of bed. Skin:  Warm & dry. No rash on exposed skin.   Vitals:   07/18/16 0810 07/18/16 1152 07/18/16 1213 07/18/16 1601  BP:   (!) 151/82   Pulse: 77  76 97  Resp:   16   Temp:   99 F (37.2 C)   TempSrc:   Oral   SpO2:  100% 100% 100%   Weight:      Height:         Discharge Labs  BMET  Recent Labs Lab 07/13/16 0652 07/14/16 0430 07/15/16 0440 07/16/16 0300 07/17/16 0345 07/18/16 0417  NA 135 136 134* 138 139 135  K 4.0 3.3* 3.7 3.5 3.9 4.2  CL 102 99* 98* 100* 100* 98*  CO2 '24 27 26 28 28 27  ' GLUCOSE 119* 169* 198* 153* 158* 130*  BUN 66* 23* 30* 14 28* 38*  CREATININE 3.98* 2.31* 3.17* 2.38* 3.40* 4.13*  CALCIUM 8.3* 8.3* 8.1* 8.5* 8.9 8.8*  MG  --  1.8 2.1 2.0 2.1 2.0  PHOS 5.3*  --  4.7* 3.0 4.0 4.8*    CBC  Recent Labs Lab 07/16/16 0300 07/17/16 0345 07/18/16 0417  HGB 7.3* 7.5* 7.4*  HCT 22.6* 23.4* 23.4*  WBC 31.9* 25.3* 19.8*  PLT 361 363 316    Anti-Coagulation  Recent Labs Lab 07/17/16 0345  INR 1.33    Discharge Instructions    Call MD for:  difficulty breathing, headache or visual disturbances    Complete by:  As directed    Call MD for:  hives    Complete by:  As directed    Call MD for:  persistant dizziness or light-headedness    Complete by:  As directed    Call MD for:  persistant nausea and vomiting    Complete by:  As directed    Call MD for:  redness, tenderness, or signs of infection (pain, swelling, redness, odor or green/yellow discharge around incision site)    Complete by:  As directed    Call MD for:  severe uncontrolled pain    Complete by:  As directed    Call MD for:  temperature >100.4    Complete by:  As directed    Increase activity slowly    Complete by:  As directed        Follow-up Information    FULLER,SUSAN, NP .   Specialty:  Nurse Practitioner Why:  Post discharge from [facility] Contact information: [address] [place] [address] [phone]              Medication List    STOP taking these medications   clopidogrel 75 MG tablet Commonly known as:  PLAVIX   gabapentin 100 MG capsule Commonly known as:  NEURONTIN   insulin glargine 100 UNIT/ML injection Commonly known as:  LANTUS   metFORMIN 1000 MG  tablet Commonly known as:  GLUCOPHAGE   metoprolol succinate 50 MG 24 hr tablet Commonly known as:  TOPROL-XL   multivitamin with minerals Tabs tablet   niacin 1000 MG CR tablet Commonly known as:  NIASPAN   nitroGLYCERIN 0.4 MG SL tablet Commonly known as:  NITROSTAT   OXYGEN   propylthiouracil 50 MG tablet Commonly known as:  PTU   ranitidine 150 MG tablet Commonly known as:  ZANTAC   traMADol 50 MG tablet Commonly known as:  ULTRAM   trimethoprim 100 MG tablet Commonly known as:  TRIMPEX   Vitamin D 2000 units tablet     TAKE these medications   albuterol (2.5 MG/3ML) 0.083% nebulizer solution Commonly known as:  PROVENTIL Take 3 mLs (2.5 mg total) by nebulization every 4 (four) hours as needed for wheezing or shortness of breath.   aspirin 81 MG chewable tablet Place 2 tablets (162 mg total) into feeding tube daily. Start taking on:  07/19/2016   chlorhexidine gluconate (MEDLINE KIT) 0.12 % solution Commonly known as:  PERIDEX 15 mLs by Mouth Rinse route 2 (two) times daily.   ciprofloxacin 400 MG/200ML Soln Commonly known as:  CIPRO Inject 200 mLs (400 mg total) into the vein daily. Start taking on:  07/19/2016   Darbepoetin Alfa 100 MCG/0.5ML Sosy injection Commonly known as:  ARANESP Inject 0.5 mLs (100 mcg total) into the vein every Tuesday with hemodialysis.   famotidine 20 MG tablet Commonly known as:  PEPCID Place 1 tablet (20 mg total) into feeding tube daily. Start taking on:  07/19/2016   feeding supplement (NEPRO CARB STEADY) Liqd Take 1,000 mLs by mouth daily.   heparin 5000 UNIT/ML injection Inject 1 mL (5,000 Units total) into the skin every 8 (eight) hours.   insulin aspart 100 UNIT/ML injection Commonly known as:  novoLOG CBG < 70: implement hypoglycemia protocol CBG 70 - 120: 0 units CBG 121 - 150: 1 unit CBG 151 - 200: 2 units CBG 201 - 250: 3 units CBG 251 - 300: 5 units CBG 301 - 350: 7 units CBG 351 - 400: 9 units CBG > 400:  call MD and obtain STAT lab verification   methimazole 10 MG tablet Commonly known as:  TAPAZOLE Place 1 tablet (10 mg total) into feeding tube daily. Start taking on:  07/19/2016   metoprolol tartrate 25 mg/10 mL Susp Commonly known as:  LOPRESSOR Place 10 mLs (25 mg total) into feeding tube 2 (two) times daily.   mouth rinse Liqd solution 15 mLs by Mouth Rinse route QID.   rosuvastatin 40 MG tablet Commonly known as:  CRESTOR Take 1 tablet (40 mg total) by mouth at bedtime.   vancomycin 50 mg/mL oral solution Commonly known as:  VANCOCIN Take 10 mLs (500 mg total) by mouth every 6 (six) hours.   vitamin C 500 MG tablet Commonly known as:  ASCORBIC ACID Take 500 mg by mouth daily.         Disposition:  [facility]  Discharged Condition: Angelica Beck has met maximum benefit of inpatient care and is medically stable and cleared for discharge.  Patient is pending follow up as above.      Time spent on disposition:  Greater than 35 minutes.   Signed: Noe Gens, NP-C  Pulmonary & Critical Care Pgr: [phone] Office: [phone]  Patient seen and examined, agree with above note.  I dictated the care and orders written for this patient under my direction.  Rush Farmer, MD [phone]

## 2016-07-19 ENCOUNTER — Other Ambulatory Visit (HOSPITAL_COMMUNITY): Payer: Self-pay

## 2016-07-19 DIAGNOSIS — J9811 Atelectasis: Secondary | ICD-10-CM

## 2016-07-19 DIAGNOSIS — Z931 Gastrostomy status: Secondary | ICD-10-CM

## 2016-07-19 DIAGNOSIS — J9600 Acute respiratory failure, unspecified whether with hypoxia or hypercapnia: Secondary | ICD-10-CM

## 2016-07-19 LAB — CULTURE, BLOOD (ROUTINE X 2): CULTURE: NO GROWTH

## 2016-07-19 LAB — CBC
HCT: 23.3 % — ABNORMAL LOW (ref 36.0–46.0)
Hemoglobin: 7.5 g/dL — ABNORMAL LOW (ref 12.0–15.0)
MCH: 28.8 pg (ref 26.0–34.0)
MCHC: 32.2 g/dL (ref 30.0–36.0)
MCV: 89.6 fL (ref 78.0–100.0)
PLATELETS: 297 10*3/uL (ref 150–400)
RBC: 2.6 MIL/uL — AB (ref 3.87–5.11)
RDW: 15.5 % (ref 11.5–15.5)
WBC: 17.7 10*3/uL — AB (ref 4.0–10.5)

## 2016-07-19 LAB — COMPREHENSIVE METABOLIC PANEL
ALK PHOS: 135 U/L — AB (ref 38–126)
ALT: 23 U/L (ref 14–54)
AST: 23 U/L (ref 15–41)
Albumin: 1.5 g/dL — ABNORMAL LOW (ref 3.5–5.0)
Anion gap: 13 (ref 5–15)
BILIRUBIN TOTAL: 0.9 mg/dL (ref 0.3–1.2)
BUN: 46 mg/dL — AB (ref 6–20)
CALCIUM: 8.7 mg/dL — AB (ref 8.9–10.3)
CHLORIDE: 98 mmol/L — AB (ref 101–111)
CO2: 25 mmol/L (ref 22–32)
CREATININE: 4.81 mg/dL — AB (ref 0.44–1.00)
GFR, EST AFRICAN AMERICAN: 10 mL/min — AB (ref >60)
GFR, EST NON AFRICAN AMERICAN: 9 mL/min — AB (ref >60)
Glucose, Bld: 178 mg/dL — ABNORMAL HIGH (ref 65–99)
Potassium: 4.6 mmol/L (ref 3.5–5.1)
Sodium: 136 mmol/L (ref 135–145)
Total Protein: 6 g/dL — ABNORMAL LOW (ref 6.5–8.1)

## 2016-07-19 NOTE — Consult Note (Signed)
Date: 07/19/2016                  Patient Name:  Angelica Beck  MRN: [id number]  DOB: 05/04/1951  Age / Sex: 65 y.o., female         PCP: Delia Chimes, NP                 Service Requesting Consult: Internal medicine                 Reason for Consult: ARF            History of Present Illness: Patient is a 65 y.o. female with medical problems of stroke, left-sided weakness, diabetes, chronic kidney disease, chronic urinary tract infections due to incontinence, who was admitted on 07/18/2016 for ongoing management.  Please note patient is unable to provide any information. She is requiring ventilator support via tracheostomy. All information is obtained from the chart.  Patient was initially admitted on September 23 with septic shock secondary to urinary tract infection. She required continuous venovenous hemodialysis and IHD for acute renal failure. Her last hemodialysis was on October 21. By report, mental status improved with dialysis. Urine culture showed Klebsiella and Escherichia coli which was treated with meropenem. She was extubated on October 14 but required intermittent noninvasive positive pressure ventilation. She was reintubated on October 16. Her hospital course was further complicated by small bowel obstruction which resolved. She was also positive for C. Difficile and treated for treated with oral vancomycin. She had a tracheostomy placed by ENT on October 20 and a G-tube placed by interventional radiology on October 23. Sputum culture positive for Escherichia coli. She was admitted to [facility] on October 24. Nephrology consult has been requested for further evaluation and management of acute renal failure and arrange for dialysis.  Other pertinent information included CT of the head which showed chronic small vessel ischemic changes, chronic lacunar infarct, left basal ganglia and chronic ischemic right side of pons. CT of the abdomen showed small bowel obstruction.  Echocardiogram showed normal LV function with EF 60-65% with grade 1 diastolic dysfunction.   Medications: Outpatient medications: Prescriptions Prior to Admission  Medication Sig Dispense Refill Last Dose  . albuterol (PROVENTIL) (2.5 MG/3ML) 0.083% nebulizer solution Take 3 mLs (2.5 mg total) by nebulization every 4 (four) hours as needed for wheezing or shortness of breath. 75 mL 12   . aspirin 81 MG chewable tablet Place 2 tablets (162 mg total) into feeding tube daily.     . chlorhexidine gluconate, MEDLINE KIT, (PERIDEX) 0.12 % solution 15 mLs by Mouth Rinse route 2 (two) times daily. 120 mL 0   . ciprofloxacin (CIPRO) 400 MG/200ML SOLN Inject 200 mLs (400 mg total) into the vein daily. 2000 mL    . Darbepoetin Alfa (ARANESP) 100 MCG/0.5ML SOSY injection Inject 0.5 mLs (100 mcg total) into the vein every Tuesday with hemodialysis. 4.2 mL    . famotidine (PEPCID) 20 MG tablet Place 1 tablet (20 mg total) into feeding tube daily.     . heparin 5000 UNIT/ML injection Inject 1 mL (5,000 Units total) into the skin every 8 (eight) hours. 1 mL    . insulin aspart (NOVOLOG) 100 UNIT/ML injection CBG < 70: implement hypoglycemia protocol CBG 70 - 120: 0 units CBG 121 - 150: 1 unit CBG 151 - 200: 2 units CBG 201 - 250: 3 units CBG 251 - 300: 5 units CBG 301 - 350: 7 units CBG 351 - 400: 9  units CBG > 400: call MD and obtain STAT lab verification 10 mL 11   . methimazole (TAPAZOLE) 10 MG tablet Place 1 tablet (10 mg total) into feeding tube daily.     . metoprolol tartrate (LOPRESSOR) 25 mg/10 mL SUSP Place 10 mLs (25 mg total) into feeding tube 2 (two) times daily.     . Mouthwashes (MOUTH RINSE) LIQD solution 15 mLs by Mouth Rinse route QID.  0   . Nutritional Supplements (FEEDING SUPPLEMENT, NEPRO CARB STEADY,) LIQD Take 1,000 mLs by mouth daily.  0   . rosuvastatin (CRESTOR) 40 MG tablet Take 1 tablet (40 mg total) by mouth at bedtime. 30 tablet 1 Past Week at Unknown time  . vancomycin  (VANCOCIN) 50 mg/mL oral solution Take 10 mLs (500 mg total) by mouth every 6 (six) hours.     . vitamin C (ASCORBIC ACID) 500 MG tablet Take 500 mg by mouth daily.   Past Week at Unknown time    Current medications: Current Facility-Administered Medications  Medication Dose Route Frequency Provider Last Rate Last Dose  . diatrizoate meglumine-sodium (GASTROGRAFIN) 66-10 % solution 30 mL  30 mL Oral Once Merton Border, MD        Ciprofloxacin 400 mg IV daily Aspirin 81 mg per tube Atorvastatin 40 mg   bedtime Famotidine 20 mg daily per tube Heparin 5000 units subcutaneous every 8 hours Methimazole 10 mg daily Metoprolol 25 mg twice a day Nephro-Vite 1 tablet daily Potassium chloride 20 mEq daily Oral vancomycin 500 mg every 6 hours for 2 Vitamin C 500 mg daily for 2  Allergies: Allergies  Allergen Reactions  . Penicillins Swelling    FACIAL SWELLING  . Lactose Intolerance (Gi) Diarrhea and Nausea And Vomiting      Past Medical History: Past Medical History:  Diagnosis Date  . Arthritis   . Diabetes mellitus, type 2 ([place])   . Hyperlipidemia   . Hypertension   . Hyperthyroidism   . Obesity hypoventilation syndrome ([place]) 06/20/2013  . Stroke [facility])      Past Surgical History: Past Surgical History:  Procedure Laterality Date  . IR GENERIC HISTORICAL  07/12/2016   IR US GUIDE VASC ACCESS RIGHT 07/12/2016 [name], MD [facility]  . IR GENERIC HISTORICAL  07/12/2016   IR FLUORO GUIDE CV LINE RIGHT 07/12/2016 [name], MD [facility]  . IR GENERIC HISTORICAL  07/17/2016   IR GASTROSTOMY TUBE MOD SED 07/17/2016 [name], MD [facility]  . TRACHEOSTOMY TUBE PLACEMENT N/A 07/14/2016   Procedure: TRACHEOSTOMY, THYROID ISTHMUSECTOMY;  Surgeon: [name], MD;  Location[facility] OR;  Service: ENT;  Laterality: N/A;  . VENTRAL HERNIA REPAIR       Family History: Family History  Problem Relation Age of Onset  . Diabetes Mother      Social  History: Social History   Social History  . Marital status: Single    Spouse name: N/A  . Number of children: N/A  . Years of education: N/A   Occupational History  . Not on file.   Social History Main Topics  . Smoking status: Former Smoker    Packs/day: 0.25    Years: 30.00    Types: Cigarettes    Quit date: 09/25/2002  . Smokeless tobacco: Never Used  . Alcohol use No  . Drug use: No  . Sexual activity: Not on file   Other Topics Concern  . Not on file   Social History Narrative  . No narrative on  file     Review of Systems: not available. see above. Gen:  HEENT:  CV:  Resp:  GI: GU :  MS:  Derm:   Psych: Heme:  Neuro:  Endocrine  Vital Signs: There were no vitals taken for this visit.  No intake or output data in the 24 hours ending 07/19/16 1740  Weight trends: There were no vitals filed for this visit. Temperature 98.3, pulse 76, respirations 16, blood pressure 159/87  Physical Exam: General:  chronically appearing, laying in the bed  HEENT Anicteric, mucous membranes  Neck:  tracheostomy  Lungs: Coarse breath sounds bilaterally  Heart::  no or gallop  Abdomen: Soft, PEG tube in place  Extremities:  no peripheral edema or  Neurologic: Alert, able to follow simple commands  Skin: Warm, dry  Access: Right IJ PermCath      Isolation due to C. difficile    Lab results: Basic Metabolic Panel:  Recent Labs Lab 07/16/16 0300 07/17/16 0345 07/18/16 0417 07/19/16 0633  NA 138 139 135 136  K 3.5 3.9 4.2 4.6  CL 100* 100* 98* 98*  CO2 '28 28 27 25  ' GLUCOSE 153* 158* 130* 178*  BUN 14 28* 38* 46*  CREATININE 2.38* 3.40* 4.13* 4.81*  CALCIUM 8.5* 8.9 8.8* 8.7*  MG 2.0 2.1 2.0  --   PHOS 3.0 4.0 4.8*  --     Liver Function Tests:  Recent Labs Lab 07/19/16 0633  AST 23  ALT 23  ALKPHOS 135*  BILITOT 0.9  PROT 6.0*  ALBUMIN 1.5*   No results for input(s): LIPASE, AMYLASE in the last 168 hours. No results for input(s): AMMONIA  in the last 168 hours.  CBC:  Recent Labs Lab 07/17/16 0345 07/18/16 0417 07/19/16 0633  WBC 25.3* 19.8* 17.7*  NEUTROABS 22.0* 17.1*  --   HGB 7.5* 7.4* 7.5*  HCT 23.4* 23.4* 23.3*  MCV 90.7 91.1 89.6  PLT 363 316 297    Cardiac Enzymes: No results for input(s): CKTOTAL, TROPONINI in the last 168 hours.  BNP: Invalid input(s): POCBNP  CBG:  Recent Labs Lab 07/18/16 0023 07/18/16 0501 07/18/16 0734 07/18/16 1207 07/18/16 1638  GLUCAP 135* 120* 126* 148* 111*    Microbiology: Recent Results (from the past 720 hour(s))  Blood Culture (routine x 2)     Status: None   Collection Time: 06/20/16 10:25 PM  Result Value Ref Range Status   Specimen Description BLOOD RIGHT HAND  Final   Special Requests IN PEDIATRIC BOTTLE 1ML  Final   Culture NO GROWTH 5 DAYS  Final   Report Status 06/25/2016 FINAL  Final  Blood Culture (routine x 2)     Status: Abnormal   Collection Time: 06/20/16 10:29 PM  Result Value Ref Range Status   Specimen Description BLOOD LEFT ARM  Final   Special Requests BOTTLES DRAWN AEROBIC AND ANAEROBIC 5ML  Final   Culture  Setup Time   Final    GRAM POSITIVE COCCI IN CLUSTERS IN BOTH AEROBIC AND ANAEROBIC BOTTLES CRITICAL RESULT CALLED TO, READ BACK BY AND VERIFIED WITH: T. DANG, [place] ON 06/21/16 BY C. JESSUP, MLT.    Culture (A)  Final    STAPHYLOCOCCUS SPECIES (COAGULASE NEGATIVE) THE SIGNIFICANCE OF ISOLATING THIS ORGANISM FROM A SINGLE SET OF BLOOD CULTURES WHEN MULTIPLE SETS ARE DRAWN IS UNCERTAIN. PLEASE NOTIFY THE MICROBIOLOGY DEPARTMENT WITHIN ONE WEEK IF SPECIATION AND SENSITIVITIES ARE REQUIRED.    Report Status 06/23/2016 FINAL  Final  Urine culture  Status: Abnormal   Collection Time: 06/20/16 10:29 PM  Result Value Ref Range Status   Specimen Description IN/OUT CATH URINE  Final   Special Requests NONE  Final   Culture (A)  Final    5,000 COLONIES/mL ESCHERICHIA COLI 1,000 COLONIES/mL KLEBSIELLA PNEUMONIAE    Report  Status 06/23/2016 FINAL  Final   Organism ID, Bacteria ESCHERICHIA COLI (A)  Final   Organism ID, Bacteria KLEBSIELLA PNEUMONIAE (A)  Final      Susceptibility   Escherichia coli - MIC*    AMPICILLIN >=32 RESISTANT Resistant     CEFAZOLIN <=4 SENSITIVE Sensitive     CEFTRIAXONE <=1 SENSITIVE Sensitive     CIPROFLOXACIN 1 SENSITIVE Sensitive     GENTAMICIN <=1 SENSITIVE Sensitive     IMIPENEM <=0.25 SENSITIVE Sensitive     NITROFURANTOIN <=16 SENSITIVE Sensitive     TRIMETH/SULFA >=320 RESISTANT Resistant     AMPICILLIN/SULBACTAM 16 INTERMEDIATE Intermediate     PIP/TAZO <=4 SENSITIVE Sensitive     Extended ESBL NEGATIVE Sensitive     * 5,000 COLONIES/mL ESCHERICHIA COLI   Klebsiella pneumoniae - MIC*    AMPICILLIN >=32 RESISTANT Resistant     CEFAZOLIN <=4 SENSITIVE Sensitive     CEFTRIAXONE <=1 SENSITIVE Sensitive     CIPROFLOXACIN <=0.25 SENSITIVE Sensitive     GENTAMICIN <=1 SENSITIVE Sensitive     IMIPENEM <=0.25 SENSITIVE Sensitive     NITROFURANTOIN <=16 SENSITIVE Sensitive     TRIMETH/SULFA <=20 SENSITIVE Sensitive     AMPICILLIN/SULBACTAM 4 SENSITIVE Sensitive     PIP/TAZO <=4 SENSITIVE Sensitive     Extended ESBL NEGATIVE Sensitive     * 1,000 COLONIES/mL KLEBSIELLA PNEUMONIAE  Gram stain     Status: None   Collection Time: 06/20/16 10:29 PM  Result Value Ref Range Status   Specimen Description IN/OUT CATH URINE  Final   Special Requests NONE  Final   Gram Stain   Final    CYTOSPIN SMEAR SQUAMOUS EPITHELIAL CELLS PRESENT WBC PRESENT, PREDOMINANTLY MONONUCLEAR GRAM VARIABLE ROD    Report Status 06/20/2016 FINAL  Final  Blood Culture ID Panel (Reflexed)     Status: Abnormal   Collection Time: 06/20/16 10:29 PM  Result Value Ref Range Status   Enterococcus species NOT DETECTED NOT DETECTED Final   Listeria monocytogenes NOT DETECTED NOT DETECTED Final   Staphylococcus species DETECTED (A) NOT DETECTED Final    Comment: CRITICAL RESULT CALLED TO, READ BACK BY AND  VERIFIED WITH: T. DANG, [place] ON 06/21/16 BY C. JESSUP, MLT.    Staphylococcus aureus NOT DETECTED NOT DETECTED Final   Methicillin resistance NOT DETECTED NOT DETECTED Final   Streptococcus species NOT DETECTED NOT DETECTED Final   Streptococcus agalactiae NOT DETECTED NOT DETECTED Final   Streptococcus pneumoniae NOT DETECTED NOT DETECTED Final   Streptococcus pyogenes NOT DETECTED NOT DETECTED Final   Acinetobacter baumannii NOT DETECTED NOT DETECTED Final   Enterobacteriaceae species NOT DETECTED NOT DETECTED Final   Enterobacter cloacae complex NOT DETECTED NOT DETECTED Final   Escherichia coli NOT DETECTED NOT DETECTED Final   Klebsiella oxytoca NOT DETECTED NOT DETECTED Final   Klebsiella pneumoniae NOT DETECTED NOT DETECTED Final   Proteus species NOT DETECTED NOT DETECTED Final   Serratia marcescens NOT DETECTED NOT DETECTED Final   Haemophilus influenzae NOT DETECTED NOT DETECTED Final   Neisseria meningitidis NOT DETECTED NOT DETECTED Final   Pseudomonas aeruginosa NOT DETECTED NOT DETECTED Final   Candida albicans NOT DETECTED NOT DETECTED Final  Candida glabrata NOT DETECTED NOT DETECTED Final   Candida krusei NOT DETECTED NOT DETECTED Final   Candida parapsilosis NOT DETECTED NOT DETECTED Final   Candida tropicalis NOT DETECTED NOT DETECTED Final  MRSA PCR Screening     Status: None   Collection Time: 06/21/16 12:53 AM  Result Value Ref Range Status   MRSA by PCR NEGATIVE NEGATIVE Final    Comment:        The GeneXpert MRSA Assay (FDA approved for NASAL specimens only), is one component of a comprehensive MRSA colonization surveillance program. It is not intended to diagnose MRSA infection nor to guide or monitor treatment for MRSA infections.   C difficile quick scan w PCR reflex     Status: Abnormal   Collection Time: 07/04/16 10:10 AM  Result Value Ref Range Status   C Diff antigen POSITIVE (A) NEGATIVE Final   C Diff toxin POSITIVE (A) NEGATIVE  Final   C Diff interpretation Toxin producing C. difficile detected.  Final    Comment: CRITICAL RESULT CALLED TO, READ BACK BY AND VERIFIED WITH: L. Flood RN 11:45 07/04/16 ([email or web address])   Culture, blood (routine x 2)     Status: Abnormal   Collection Time: 07/14/16 12:48 PM  Result Value Ref Range Status   Specimen Description BLOOD LEFT HAND  Final   Special Requests IN PEDIATRIC BOTTLE 3CC  Final   Culture  Setup Time   Final    GRAM POSITIVE COCCI IN CLUSTERS IN PEDIATRIC BOTTLE CRITICAL RESULT CALLED TO, READ BACK BY AND VERIFIED WITH: J. ARMINGER, PHARM [facility] [place] [id number] BY Rhea Bleacher    Culture (A)  Final    STAPHYLOCOCCUS SPECIES (COAGULASE NEGATIVE) THE SIGNIFICANCE OF ISOLATING THIS ORGANISM FROM A SINGLE SET OF BLOOD CULTURES WHEN MULTIPLE SETS ARE DRAWN IS UNCERTAIN. PLEASE NOTIFY THE MICROBIOLOGY DEPARTMENT WITHIN ONE WEEK IF SPECIATION AND SENSITIVITIES ARE REQUIRED.    Report Status 07/17/2016 FINAL  Final  Culture, blood (routine x 2)     Status: None   Collection Time: 07/14/16 12:59 PM  Result Value Ref Range Status   Specimen Description BLOOD RIGHT HAND  Final   Special Requests IN PEDIATRIC BOTTLE 3CC  Final   Culture NO GROWTH 5 DAYS  Final   Report Status 07/19/2016 FINAL  Final  Culture, respiratory (NON-Expectorated)     Status: None   Collection Time: 07/14/16  2:55 PM  Result Value Ref Range Status   Specimen Description TRACHEAL ASPIRATE  Final   Special Requests NONE  Final   Gram Stain   Final    ABUNDANT WBC PRESENT, PREDOMINANTLY PMN FEW GRAM NEGATIVE RODS RARE GRAM POSITIVE COCCI IN PAIRS    Culture FEW ESCHERICHIA COLI  Final   Report Status 07/16/2016 FINAL  Final   Organism ID, Bacteria ESCHERICHIA COLI  Final      Susceptibility   Escherichia coli - MIC*    AMPICILLIN >=32 RESISTANT Resistant     CEFAZOLIN <=4 SENSITIVE Sensitive     CEFEPIME <=1 SENSITIVE Sensitive     CEFTAZIDIME <=1 SENSITIVE Sensitive     CEFTRIAXONE <=1 SENSITIVE  Sensitive     CIPROFLOXACIN 1 SENSITIVE Sensitive     GENTAMICIN <=1 SENSITIVE Sensitive     IMIPENEM <=0.25 SENSITIVE Sensitive     TRIMETH/SULFA >=320 RESISTANT Resistant     AMPICILLIN/SULBACTAM 16 INTERMEDIATE Intermediate     PIP/TAZO <=4 SENSITIVE Sensitive     Extended ESBL NEGATIVE Sensitive     * FEW ESCHERICHIA  COLI  Culture, respiratory (NON-Expectorated)     Status: None (Preliminary result)   Collection Time: 07/18/16  8:04 PM  Result Value Ref Range Status   Specimen Description TRACHEAL ASPIRATE  Final   Special Requests NONE  Final   Gram Stain   Final    MODERATE WBC PRESENT,BOTH PMN AND MONONUCLEAR NO ORGANISMS SEEN    Culture CULTURE REINCUBATED FOR BETTER GROWTH  Final   Report Status PENDING  Incomplete     Coagulation Studies:  Recent Labs  07/17/16 0345  LABPROT 16.5*  INR 1.33    Urinalysis: No results for input(s): COLORURINE, LABSPEC, PHURINE, GLUCOSEU, HGBUR, BILIRUBINUR, KETONESUR, PROTEINUR, UROBILINOGEN, NITRITE, LEUKOCYTESUR in the last 72 hours.  Invalid input(s): APPERANCEUR      Imaging: Dg Chest Port 1 View  Result Date: 07/19/2016 CLINICAL DATA:  Hypertension and stroke.  LEFT lung collapse. EXAM: PORTABLE CHEST 1 VIEW COMPARISON:  07/18/2016. FINDINGS: Partial re-aeration of the LEFT lung. Significant retrocardiac density remains. Mild edema pattern throughout the RIGHT lung and re-aerated LEFT upper lobe. Support tubes and lines unchanged. IMPRESSION: Residual LEFT lower lobe atelectasis. Partial re- aeration of the LEFT lung. Mild edema. Electronically Signed   By: Staci Righter M.D.   On: 07/19/2016 08:39   Dg Chest Port 1 View  Result Date: 07/18/2016 CLINICAL DATA:  Respiratory failure. Chronic kidney disease. Diabetes. EXAM: PORTABLE CHEST 1 VIEW COMPARISON:  07/15/2016 FINDINGS: Right-sided dialysis catheter terminates at the mid SVC. Left-sided subclavian line terminates at the low SVC. Patient rotated left. Tracheostomy  appropriately positioned. Heart size not well evaluated. Whiteout of the left hemi thorax. Clear right lung. No pneumothorax. IMPRESSION: Significantly worsened left-sided aeration, with with development of whiteout. This is likely due to a combination of pleural fluid and airspace disease. Mucous plugging could cause a portion this, given relative volume loss. Electronically Signed   By: Abigail Miyamoto M.D.   On: 07/18/2016 09:01   Dg Abd Portable 1v  Result Date: 07/18/2016 CLINICAL DATA:  Check PEG tube placement EXAM: PORTABLE ABDOMEN - 1 VIEW COMPARISON:  07/17/2016 FINDINGS: The tip of a left-sided percutaneous gastrostomy tube is seen outlined by contrast within the lumen of the stomach. No extravasation of contrast. There is pre-existing enteric contrast within the descending through sigmoid colon. A coarsely calcified uterine fibroid is seen in the right hemipelvis. IMPRESSION: PEG tube is seen within the gastric body. Electronically Signed   By: Ashley Royalty M.D.   On: 07/18/2016 21:17      Assessment & Plan: Pt is a 65 y.o. 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 Acute renal failure  Likely secondary to ATN from septic shock CRRT initially started on September 30. Subsequently, Transitioned to intermittent hemodialysis. Patient underwent successful hemodialysis earlier today 3000 cc of fluid was removed with IV albumin support Next hemodialysis planned on Friday  2. Acute respiratory failure Regarding when to do to support and tracheostomy FiO2 28%. PEEP 5.  3. Pneumonia Tracheal aspirate from October 20 is growing Escherichia coli Antibiotics as per internal medicine team Pharmacy to adjust  dosing for HD  4. Malnutrition Albumin 1.5  5. Anemia Current hemoglobin 7.5 We'll obtain iron studies with dialysis

## 2016-07-19 NOTE — Consult Note (Signed)
Name: Lessie Funderburke MRN: [id number] DOB: [birth year]    ADMISSION DATE:  07/18/2016 CONSULTATION DATE:  10/25  REFERRING MD :  Hijazi   CHIEF COMPLAINT:  Vent management   BRIEF PATIENT DESCRIPTION: 65 y.o. 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 Initially admitted 9/23 with septic shock secondary to UTI.  She required CVVHD in the setting of acute on chronic renal failure.  Last HD completed 10/21.  Mental status improved with CVVHD.  Initial urine culture was positive for klebsiella and e-coli.  She was treated with 7 days of merrem.  She was extubated 10/14 but requiring intermittent noninvasive positive pressure ventilation. She failed extubation and was re-intubated 10/16.  Course complicated by SBO (now resolved). She was found to be positive for C diff & treated with PO vancomycin. Due to poor weaning, she had a tracheostomy placed by ENT 10/20 & G-tube placement by IR on 10/23. Sputum culture grew e-coli and she was treated with Cipro.  The patient was medically cleared for discharge to [facility] on 10/24  STUDIES: CT Head 9/27 >> No acute abnormality. Chronic small vessel ischemic changes. Chronic lacunar infarct left basal ganglia &chronic ischemia right side of pons.  CT Abd/Pelvis w/o 9/27 >>Early partial SBO w/ transition point LLQ with adhesions. Left humerus soft tissue swelling &intramuscular gas. Punctate hepatic granulomas noted.  Left Arm X-ray 9/27 >>Diffuse soft tissue edema.  TTE 9/27 >>LV w/ EF 60-65%. Grade 1 Diastolic dysfunction. No regional wall motion abnormalities. LA &RA normal in size. RV normal in size &function. Mild AS w/o AR. Mild MR w/o MS.  ABIs 10/3 >>R normal. L posterior artery noncompressible; suspect calcification.  CT head 10/3 >>No acute infarct, no hemorrhage or mass.  Atrophy evident. Similar to prior study.   MICROBIOLOGY: MRSA PCR 9/27 >> Negative Blood Ctx x 2 9/26 >> Coag Neg Staph 1/2 Urine Ctx 9/26 >>5000 CFU E coli &1000 CFU Klebsiella  C diff PCR 10/10 >> Toxin &Antigen Positive Sputum 10/20 >> e-coli, sens CIPRO BC 10/20 >> E.coli  ANTIBIOTICS: Levaquin 9/26 - 9/28 Aztreonam 9/26 - 9/27 Vancomycin 9/26 - off Merrem 9/27 - 10/3 PO Vancomycin 10/10 >> Flagyl IV 10/15 (while NGT is out) >> 10/16 Cipro 10/22 >>  SIGNIFICANT EVENTS: 09/26 - Admit for presumed septic shock 10/14 - extubated 10/16 - reintubated 10/18 - HD perm cath placed 10/20 - Trach per Dr. Redmond Baseman 10/23 - PEG per IR   LINES/TUBES: OETT 7.5 9/26 - 10/14, OETT 10/16 >>10/20, TRACH 10/20 (ENT Dr Redmond Baseman)  OGT 9/26 - 10/14 OGT 10/16 >> L IJ CVL 9/26 >> Foley 9/26 >> 10/8 RIJ HD 9/30 >> 10/18 Rectal pouch 10/17 > 10/18 NG tube 10/17 >> R HD Perm Cath 10/18 >>  HISTORY OF PRESENT ILLNESS:  65 y.o. 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 Initially admitted 9/23 with septic shock secondary to UTI.  She required CVVHD in the setting of acute on chronic renal failure.  Last HD completed 10/21.  Mental status improved with CVVHD.  Initial urine culture was positive for klebsiella and e-coli.  She was treated with 7 days of merrem.  She was extubated 10/14 but requiring intermittent noninvasive positive pressure ventilation. She failed extubation and was re-intubated  10/16.  Course complicated by SBO (now resolved). She was found to be positive for C diff & treated with PO vancomycin. Due to poor weaning, she had a tracheostomy placed by ENT 10/20 & G-tube placement by IR on 10/23. Sputum culture grew e-coli and she was treated with Cipro.  The patient was medically cleared for discharge to [place] on  10/24  PAST MEDICAL HISTORY :   has a past medical history of Arthritis; Diabetes mellitus, type 2 ([place]); Hyperlipidemia; Hypertension; Hyperthyroidism; Obesity hypoventilation syndrome ([place]) (06/20/2013); and Stroke [facility]).  has a past surgical history that includes Ventral hernia repair; ir generic historical (07/12/2016); ir generic historical (07/12/2016); Tracheostomy tube placement (N/A, 07/14/2016); and ir generic historical (07/17/2016). Prior to Admission medications   Medication Sig Start Date End Date Taking? Authorizing Provider  albuterol (PROVENTIL) (2.5 MG/3ML) 0.083% nebulizer solution Take 3 mLs (2.5 mg total) by nebulization every 4 (four) hours as needed for wheezing or shortness of breath. 07/18/16   [name], NP  aspirin 81 MG chewable tablet Place 2 tablets (162 mg total) into feeding tube daily. 07/19/16   [name], NP  chlorhexidine gluconate, MEDLINE KIT, (PERIDEX) 0.12 % solution 15 mLs by Mouth Rinse route 2 (two) times daily. 07/18/16   [name], NP  ciprofloxacin (CIPRO) 400 MG/200ML SOLN Inject 200 mLs (400 mg total) into the vein daily. 07/19/16   [name], NP  Darbepoetin Alfa (ARANESP) 100 MCG/0.5ML SOSY injection Inject 0.5 mLs (100 mcg total) into the vein every Tuesday with hemodialysis. 07/18/16   [name], NP  famotidine (PEPCID) 20 MG tablet Place 1 tablet (20 mg total) into feeding tube daily. 07/19/16   [name], NP  heparin 5000 UNIT/ML injection Inject 1 mL (5,000 Units total) into the skin every 8 (eight) hours. 07/18/16   [name], NP  insulin aspart (NOVOLOG) 100 UNIT/ML injection CBG < 70: implement hypoglycemia protocol CBG 70 - 120: 0 units CBG 121 - 150: 1 unit CBG 151 - 200: 2 units CBG 201 - 250: 3 units CBG 251 - 300: 5 units CBG 301 - 350: 7 units CBG 351 - 400: 9 units CBG > 400: call MD and obtain STAT lab verification 07/18/16   [name], NP  methimazole (TAPAZOLE) 10 MG tablet Place 1 tablet  (10 mg total) into feeding tube daily. 07/19/16   [name], NP  metoprolol tartrate (LOPRESSOR) 25 mg/10 mL SUSP Place 10 mLs (25 mg total) into feeding tube 2 (two) times daily. 07/18/16   [name], NP  Mouthwashes (MOUTH RINSE) LIQD solution 15 mLs by Mouth Rinse route QID. 07/18/16   [name], NP  Nutritional Supplements (FEEDING SUPPLEMENT, NEPRO CARB STEADY,) LIQD Take 1,000 mLs by mouth daily. 07/18/16   [name], NP  rosuvastatin (CRESTOR) 40 MG tablet Take 1 tablet (40 mg total) by mouth at bedtime. 08/24/15   Lavon Paganini Angiulli, PA-C  vancomycin (VANCOCIN) 50 mg/mL oral solution Take 10 mLs (500 mg total) by mouth every 6 (six) hours. 07/18/16   [name], NP  vitamin C (ASCORBIC ACID) 500 MG tablet Take 500 mg by mouth daily.    Historical Provider, MD   Allergies  Allergen Reactions  . Penicillins Swelling    FACIAL SWELLING  . Lactose Intolerance (Gi) Diarrhea and Nausea And Vomiting    FAMILY HISTORY:  family history includes Diabetes in her mother. SOCIAL HISTORY:  reports that she  quit smoking about 13 years ago. Her smoking use included Cigarettes. She has a 7.50 pack-year smoking history. She has never used smokeless tobacco. She reports that she does not drink alcohol or use drugs.  REVIEW OF SYSTEMS:   As per HPI - All other systems reviewed and were neg.    SUBJECTIVE:   VITAL SIGNS: Reviewed. Abnormal results dicussed in Imp/plan    PHYSICAL EXAMINATION: General:  Chronically ill appearing female, NAD  Neuro:  Awake, tracks, slow to respond at times  HEENT:  Mm moist, no JVD, tach  Cardiovascular:  s1s2 rrr Lungs:  resps even non labored on vent, few scattered rhonchi  Abdomen:  Round, soft, +bs  Musculoskeletal:  Warm and dry, no edema    Recent Labs Lab 07/17/16 0345 07/18/16 0417 07/19/16 0633  NA 139 135 136  K 3.9 4.2 4.6  CL 100* 98* 98*  CO2 '28 27 25  ' BUN 28* 38* 46*  CREATININE 3.40* 4.13* 4.81*  GLUCOSE 158*  130* 178*    Recent Labs Lab 07/17/16 0345 07/18/16 0417 07/19/16 0633  HGB 7.5* 7.4* 7.5*  HCT 23.4* 23.4* 23.3*  WBC 25.3* 19.8* 17.7*  PLT 363 316 297   Ir Gastrostomy Tube Mod Sed  Result Date: 07/17/2016 INDICATION: Ventilator dependent respiratory failure. EXAM: PERCUTANEOUS GASTROSTOMY TUBE WITH FLUOROSCOPIC GUIDANCE Physician: Stephan Minister. Anselm Pancoast, MD MEDICATIONS: Patient was already on antibiotics.  Glucagon 1 mg IV ANESTHESIA/SEDATION: Versed 3.0 mg IV; Fentanyl 150 mcg IV Moderate Sedation Time:  16 minutes The patient was continuously monitored during the procedure by the interventional radiology nurse under my direct supervision. FLUOROSCOPY TIME:  Fluoroscopy Time: 3 minutes 54 seconds (13 mGy). COMPLICATIONS: None immediate. PROCEDURE: Informed consent was obtained for a percutaneous gastrostomy tube. The patient was placed on the interventional table. An orogastric tube was placed with fluoroscopic guidance. The anterior abdomen was prepped and draped in sterile fashion. Maximal barrier sterile technique was utilized including caps, mask, sterile gowns, sterile gloves, sterile drape, hand hygiene and skin antiseptic. Stomach was inflated with air through the orogastric tube. The skin and subcutaneous tissues were anesthetized with 1% lidocaine. A 17 gauge needle was directed into the distended stomach with fluoroscopic guidance. A wire was advanced into the stomach and a T fastener was deployed. A 9-French vascular sheath was placed and the orogastric tube was snared using a Goos[place]ck snare device. The orogastric tube and snare were pulled out of the patient's mouth. The snare device was connected to a 20-French gastrostomy tube. The snare device and gastrostomy tube were pulled through the patient's mouth and out the anterior abdominal wall. The gastrostomy tube was cut to an appropriate length. Contrast injection through gastrostomy tube confirmed placement within the stomach. Fluoroscopic  images were obtained for documentation. The gastrostomy tube was flushed with normal saline. IMPRESSION: Successful fluoroscopic guided percutaneous gastrostomy tube placement. Electronically Signed   By: [name] M.D.   On: 07/17/2016 15:46   Dg Chest Port 1 View  Result Date: 07/19/2016 CLINICAL DATA:  Hypertension and stroke.  LEFT lung collapse. EXAM: PORTABLE CHEST 1 VIEW COMPARISON:  07/18/2016. FINDINGS: Partial re-aeration of the LEFT lung. Significant retrocardiac density remains. Mild edema pattern throughout the RIGHT lung and re-aerated LEFT upper lobe. Support tubes and lines unchanged. IMPRESSION: Residual LEFT lower lobe atelectasis. Partial re- aeration of the LEFT lung. Mild edema. Electronically Signed   By: Staci Righter M.D.   On: 07/19/2016 08:39   Dg Chest [facility] 1 View  Result  Date: 07/18/2016 CLINICAL DATA:  Respiratory failure. Chronic kidney disease. Diabetes. EXAM: PORTABLE CHEST 1 VIEW COMPARISON:  07/15/2016 FINDINGS: Right-sided dialysis catheter terminates at the mid SVC. Left-sided subclavian line terminates at the low SVC. Patient rotated left. Tracheostomy appropriately positioned. Heart size not well evaluated. Whiteout of the left hemi thorax. Clear right lung. No pneumothorax. IMPRESSION: Significantly worsened left-sided aeration, with with development of whiteout. This is likely due to a combination of pleural fluid and airspace disease. Mucous plugging could cause a portion this, given relative volume loss. Electronically Signed   By: Abigail Miyamoto M.D.   On: 07/18/2016 09:01   Dg Abd Portable 1v  Result Date: 07/18/2016 CLINICAL DATA:  Check PEG tube placement EXAM: PORTABLE ABDOMEN - 1 VIEW COMPARISON:  07/17/2016 FINDINGS: The tip of a left-sided percutaneous gastrostomy tube is seen outlined by contrast within the lumen of the stomach. No extravasation of contrast. There is pre-existing enteric contrast within the descending through sigmoid colon. A coarsely  calcified uterine fibroid is seen in the right hemipelvis. IMPRESSION: PEG tube is seen within the gastric body. Electronically Signed   By: Ashley Royalty M.D.   On: 07/18/2016 21:17    ASSESSMENT / PLAN:  Acute on Chronic Hypoxic Respiratory Failure status post Tracheostomy (10/20 per Dr. Redmond Baseman / ENT) Inability to Protect Airway  LLL Pneumonia / E-Coli  Obesity Hypoventilation Syndrome  PLAN -  Trach care per protocol  Clip trach sutures 10 days post insertion (10/23) ENT follow up post discharge for trach care Vent wean per select protocol  Wean PEEP / FiO2 for sats > 92% Will likely need nocturnal vent support if able to come off during the day.  High risk for recurrent atelectasis/PNA in the setting of debility / chronic illness  Intermittent f/u CXR Complete 7 days cipro, start date 10/22  Volume removal with HD per renal     Acute Encephalopathy secondary to Urosepsis Chronic Debilitation E-Coli & Klebsiella UTI - treated initially with 7 days Merrem Acute on Chronic Renal Failure secondary to sepsis - HD intermittent during admission Metabolic Acidosis - secondary to above Hyperkalemia  Lactic Acidosis - in setting of metformin and hypoperfusion with sepsis Hyperphosphatemia  Pseudo-hypocalcemia  Partial Small Bowel Obstruction - resolved History of Ventral Hernia Repair  Possible Liver Cirrhosis  Transaminitis - likely shock liver versus congestive hepatopathy  Clostridium Difficile Diarrhea Hypoglycemia  Diabetes Mellitus  Hyperthyroidism   REC -  Per select MD   Nickolas Madrid, NP 07/19/2016  11:42 AM Pager: [phone] or [phone]   STAFF NOTE: I, Merrie Roof, MD FACP have personally reviewed patient's available data, including medical history, events of note, physical examination and test results as part of my evaluation. I have discussed with resident/NP and other care providers such as pharmacist, RN and RRT. In addition, I personally  evaluated patient and elicited key findings of:  Awake, on HD, pcxr reviewed, hospital course reviewed, she has been through so much, MODS, now with trach and recent collapse also on pcxr now improved overnight, would NOT wean as of now, would increase peep 8 x 48 hr, continued mucomysts x 48 hr with chest pt, repeat pcxr Friday and asses ability to reduce peep then and sbt, progbosis is poor to recover with this, rounded with RT, orders placed, updated pt in room  Lavon Paganini. Titus Mould, MD, [place] Pgr: [facility] 07/19/2016 1:35 PM

## 2016-07-19 NOTE — Progress Notes (Signed)
Consult received for dialysis Dialysis orders discussed with dialysis nurse

## 2016-07-21 ENCOUNTER — Other Ambulatory Visit (HOSPITAL_COMMUNITY): Payer: Self-pay

## 2016-07-21 DIAGNOSIS — J9690 Respiratory failure, unspecified, unspecified whether with hypoxia or hypercapnia: Secondary | ICD-10-CM | POA: Diagnosis not present

## 2016-07-21 LAB — RENAL FUNCTION PANEL
ANION GAP: 9 (ref 5–15)
Albumin: 1.6 g/dL — ABNORMAL LOW (ref 3.5–5.0)
BUN: 29 mg/dL — ABNORMAL HIGH (ref 6–20)
CALCIUM: 8.6 mg/dL — AB (ref 8.9–10.3)
CHLORIDE: 95 mmol/L — AB (ref 101–111)
CO2: 26 mmol/L (ref 22–32)
Creatinine, Ser: 3.4 mg/dL — ABNORMAL HIGH (ref 0.44–1.00)
GFR calc non Af Amer: 13 mL/min — ABNORMAL LOW (ref >60)
GFR, EST AFRICAN AMERICAN: 15 mL/min — AB (ref >60)
Glucose, Bld: 168 mg/dL — ABNORMAL HIGH (ref 65–99)
POTASSIUM: 3.8 mmol/L (ref 3.5–5.1)
Phosphorus: 3.7 mg/dL (ref 2.5–4.6)
SODIUM: 130 mmol/L — AB (ref 135–145)

## 2016-07-21 LAB — CBC
HEMATOCRIT: 19.4 % — AB (ref 36.0–46.0)
HEMOGLOBIN: 6.3 g/dL — AB (ref 12.0–15.0)
MCH: 28.9 pg (ref 26.0–34.0)
MCHC: 32.5 g/dL (ref 30.0–36.0)
MCV: 89 fL (ref 78.0–100.0)
Platelets: 323 10*3/uL (ref 150–400)
RBC: 2.18 MIL/uL — AB (ref 3.87–5.11)
RDW: 15.5 % (ref 11.5–15.5)
WBC: 14.2 10*3/uL — ABNORMAL HIGH (ref 4.0–10.5)

## 2016-07-21 LAB — PREPARE RBC (CROSSMATCH)

## 2016-07-21 LAB — CULTURE, RESPIRATORY

## 2016-07-21 LAB — IRON AND TIBC
Iron: 39 ug/dL (ref 28–170)
SATURATION RATIOS: 27 % (ref 10.4–31.8)
TIBC: 144 ug/dL — AB (ref 250–450)
UIBC: 105 ug/dL

## 2016-07-21 LAB — TRANSFERRIN: TRANSFERRIN: 103 mg/dL — AB (ref 192–382)

## 2016-07-21 LAB — CULTURE, RESPIRATORY W GRAM STAIN

## 2016-07-21 LAB — FERRITIN: Ferritin: 704 ng/mL — ABNORMAL HIGH (ref 11–307)

## 2016-07-21 NOTE — Progress Notes (Signed)
Subjective:   Patient seen during HD Tolerating well Able to nod yes/no  Objective:  Vital signs in last 24 hours:   T 98.5  HR 97  BP 120/78  Weight change:  There were no vitals filed for this visit.  Intake/Output:   No intake or output data in the 24 hours ending 07/21/16 0944   Physical Exam: General: Chronically ill appearing, NAD  HEENT Anicteric, moist oral mucus membranes  Neck supple  Pulm/lungs Vent dependent, trach in place  CVS/Heart Regular, no rub  Abdomen:  PEG tube  Extremities: No edema, feet in soft supports  Neurologic: Awake, Able to nod yes/no  Skin: No acute rashes  Access: Rt IJ PC       Basic Metabolic Panel:   Recent Labs Lab 07/15/16 0440 07/16/16 0300 07/17/16 0345 07/18/16 0417 07/19/16 0633 07/21/16 0731  NA 134* 138 139 135 136 130*  K 3.7 3.5 3.9 4.2 4.6 3.8  CL 98* 100* 100* 98* 98* 95*  CO2 '26 28 28 27 25 26  '$ GLUCOSE 198* 153* 158* 130* 178* 168*  BUN 30* 14 28* 38* 46* 29*  CREATININE 3.17* 2.38* 3.40* 4.13* 4.81* 3.40*  CALCIUM 8.1* 8.5* 8.9 8.8* 8.7* 8.6*  MG 2.1 2.0 2.1 2.0  --   --   PHOS 4.7* 3.0 4.0 4.8*  --  3.7     CBC:  Recent Labs Lab 07/15/16 0440 07/16/16 0300 07/17/16 0345 07/18/16 0417 07/19/16 0633 07/21/16 0730  WBC 27.2* 31.9* 25.3* 19.8* 17.7* 14.2*  NEUTROABS 25.0* 28.7* 22.0* 17.1*  --   --   HGB 6.7* 7.3* 7.5* 7.4* 7.5* 6.3*  HCT 21.0* 22.6* 23.4* 23.4* 23.3* 19.4*  MCV 89.4 88.6 90.7 91.1 89.6 89.0  PLT 374 361 363 316 297 323      Microbiology:  Recent Results (from the past 720 hour(s))  C difficile quick scan w PCR reflex     Status: Abnormal   Collection Time: 07/04/16 10:10 AM  Result Value Ref Range Status   C Diff antigen POSITIVE (A) NEGATIVE Final   C Diff toxin POSITIVE (A) NEGATIVE Final   C Diff interpretation Toxin producing C. difficile detected.  Final    Comment: CRITICAL RESULT CALLED TO, READ BACK BY AND VERIFIED WITH: L. Flood RN 11:45 07/04/16  ([email or web address])   Culture, blood (routine x 2)     Status: Abnormal   Collection Time: 07/14/16 12:48 PM  Result Value Ref Range Status   Specimen Description BLOOD LEFT HAND  Final   Special Requests IN PEDIATRIC BOTTLE 3CC  Final   Culture  Setup Time   Final    GRAM POSITIVE COCCI IN CLUSTERS IN PEDIATRIC BOTTLE CRITICAL RESULT CALLED TO, READ BACK BY AND VERIFIED WITH: J. ARMINGER, PHARM AT [place] [id number] BY Rhea Bleacher    Culture (A)  Final    STAPHYLOCOCCUS SPECIES (COAGULASE NEGATIVE) THE SIGNIFICANCE OF ISOLATING THIS ORGANISM FROM A SINGLE SET OF BLOOD CULTURES WHEN MULTIPLE SETS ARE DRAWN IS UNCERTAIN. PLEASE NOTIFY THE MICROBIOLOGY DEPARTMENT WITHIN ONE WEEK IF SPECIATION AND SENSITIVITIES ARE REQUIRED.    Report Status 07/17/2016 FINAL  Final  Culture, blood (routine x 2)     Status: None   Collection Time: 07/14/16 12:59 PM  Result Value Ref Range Status   Specimen Description BLOOD RIGHT HAND  Final   Special Requests IN PEDIATRIC BOTTLE 3CC  Final   Culture NO GROWTH 5 DAYS  Final   Report Status 07/19/2016 FINAL  Final  Culture, respiratory (NON-Expectorated)     Status: None   Collection Time: 07/14/16  2:55 PM  Result Value Ref Range Status   Specimen Description TRACHEAL ASPIRATE  Final   Special Requests NONE  Final   Gram Stain   Final    ABUNDANT WBC PRESENT, PREDOMINANTLY PMN FEW GRAM NEGATIVE RODS RARE GRAM POSITIVE COCCI IN PAIRS    Culture FEW ESCHERICHIA COLI  Final   Report Status 07/16/2016 FINAL  Final   Organism ID, Bacteria ESCHERICHIA COLI  Final      Susceptibility   Escherichia coli - MIC*    AMPICILLIN >=32 RESISTANT Resistant     CEFAZOLIN <=4 SENSITIVE Sensitive     CEFEPIME <=1 SENSITIVE Sensitive     CEFTAZIDIME <=1 SENSITIVE Sensitive     CEFTRIAXONE <=1 SENSITIVE Sensitive     CIPROFLOXACIN 1 SENSITIVE Sensitive     GENTAMICIN <=1 SENSITIVE Sensitive     IMIPENEM <=0.25 SENSITIVE Sensitive     TRIMETH/SULFA >=320 RESISTANT Resistant      AMPICILLIN/SULBACTAM 16 INTERMEDIATE Intermediate     PIP/TAZO <=4 SENSITIVE Sensitive     Extended ESBL NEGATIVE Sensitive     * FEW ESCHERICHIA COLI  Culture, respiratory (NON-Expectorated)     Status: None (Preliminary result)   Collection Time: 07/18/16  8:04 PM  Result Value Ref Range Status   Specimen Description TRACHEAL ASPIRATE  Final   Special Requests NONE  Final   Gram Stain   Final    MODERATE WBC PRESENT,BOTH PMN AND MONONUCLEAR NO ORGANISMS SEEN    Culture   Final    RARE ESCHERICHIA COLI SUSCEPTIBILITIES TO FOLLOW    Report Status PENDING  Incomplete    Coagulation Studies: No results for input(s): LABPROT, INR in the last 72 hours.  Urinalysis: No results for input(s): COLORURINE, LABSPEC, PHURINE, GLUCOSEU, HGBUR, BILIRUBINUR, KETONESUR, PROTEINUR, UROBILINOGEN, NITRITE, LEUKOCYTESUR in the last 72 hours.  Invalid input(s): APPERANCEUR    Imaging: Dg Chest Port 1 View  Result Date: 07/21/2016 CLINICAL DATA:  Respiratory failure. EXAM: PORTABLE CHEST 1 VIEW COMPARISON:  07/19/2016. FINDINGS: Tracheostomy tube, right IJ dual-lumen catheter in stable position. Heart size stable. Interim resolution of left lower lobe atelectasis. Diffuse bilateral pulmonary interstitial prominence again noted, no change. No pleural effusion or pneumothorax the IMPRESSION: 1.  Lines and tubes in stable position. 2. Interim resolution of left lower lobe atelectasis. Electronically Signed   By: Marcello Moores  Register   On: 07/21/2016 08:40     Medications:     . 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 Acute  renal failure  Likely secondary to ATN from septic shock CRRT initially started on September 30. Subsequently, Transitioned to intermittent hemodialysis. Patient seen during HD Tolerating well Tue-Thu_sat schedule.  Orders to be prepared monday  2. Acute respiratory failure Currently on vent support via  tracheostomy FiO2 28%. PEEP 5.  3. Pneumonia Tracheal aspirate from October 20 is growing Escherichia coli Antibiotics as per internal medicine team Pharmacy to adjust dosing for HD  4. Malnutrition Albumin 1.6  5. Anemia Current hemoglobin 6.3 T-sats 27% Will start arasesp   LOS: 0 Angelica Beck 10/27/20179:44 AM

## 2016-07-21 NOTE — Consult Note (Signed)
Name: Angelica Beck MRN: [id number] DOB: November 09, 1950    ADMISSION DATE:  07/18/2016 CONSULTATION DATE:  10/25  REFERRING MD :  Hijazi   CHIEF COMPLAINT:  Vent management   BRIEF PATIENT DESCRIPTION: 65 y.o. 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 Initially admitted 9/23 with septic shock secondary to UTI.  She required CVVHD in the setting of acute on chronic renal failure.  Last HD completed 10/21.  Mental status improved with CVVHD.  Initial urine culture was positive for klebsiella and e-coli.  She was treated with 7 days of merrem.  She was extubated 10/14 but requiring intermittent noninvasive positive pressure ventilation. She failed extubation and was re-intubated 10/16.  Course complicated by SBO (now resolved). She was found to be positive for C diff & treated with PO vancomycin. Due to poor weaning, she had a tracheostomy placed by ENT 10/20 & G-tube placement by IR on 10/23. Sputum culture grew e-coli and she was treated with Cipro.  The patient was medically cleared for discharge to [facility] on 10/24  STUDIES: CT Head 9/27 >> No acute abnormality. Chronic small vessel ischemic changes. Chronic lacunar infarct left basal ganglia &chronic ischemia right side of pons.  CT Abd/Pelvis w/o 9/27 >>Early partial SBO w/ transition point LLQ with adhesions. Left humerus soft tissue swelling &intramuscular gas. Punctate hepatic granulomas noted.  Left Arm X-ray 9/27 >>Diffuse soft tissue edema.  TTE 9/27 >>LV w/ EF 60-65%. Grade 1 Diastolic dysfunction. No regional wall motion abnormalities. LA &RA normal in size. RV normal in size &function. Mild AS w/o AR. Mild MR w/o MS.  ABIs 10/3 >>R normal. L posterior artery noncompressible; suspect calcification.  CT head 10/3 >>No acute infarct, no hemorrhage or mass.  Atrophy evident. Similar to prior study.   MICROBIOLOGY: MRSA PCR 9/27 >> Negative Blood Ctx x 2 9/26 >> Coag Neg Staph 1/2 Urine Ctx 9/26 >>5000 CFU E coli &1000 CFU Klebsiella  C diff PCR 10/10 >> Toxin &Antigen Positive Sputum 10/20 >> e-coli, sens CIPRO BC 10/20 >> E.coli  ANTIBIOTICS: Levaquin 9/26 - 9/28 Aztreonam 9/26 - 9/27 Vancomycin 9/26 - off Merrem 9/27 - 10/3 PO Vancomycin 10/10 >> Flagyl IV 10/15 (while NGT is out) >> 10/16 Cipro 10/22 >>  SIGNIFICANT EVENTS: 09/26 - Admit for presumed septic shock 10/14 - extubated 10/16 - reintubated 10/18 - HD perm cath placed 10/20 - Trach per Dr. Redmond Baseman 10/23 - PEG per IR   LINES/TUBES: OETT 7.5 9/26 - 10/14, OETT 10/16 >>10/20, TRACH 10/20 (ENT Dr Redmond Baseman)  OGT 9/26 - 10/14 OGT 10/16 >> L IJ CVL 9/26 >> Foley 9/26 >> 10/8 RIJ HD 9/30 >> 10/18 Rectal pouch 10/17 > 10/18 NG tube 10/17 >> R HD Perm Cath 10/18 >>   SUBJECTIVE: lung open  VITAL SIGNS: Reviewed. Abnormal results dicussed in Imp/plan    PHYSICAL EXAMINATION: General:  Chronically ill appearing female, NAD  Neuro:  Awake, tracks, seems 5to understand, moves ext HEENT:  Trach clean , no secretions Cardiovascular:  s1s2 rrr Lungs: lungs clear apical Abdomen:  Round, soft, +bs  Musculoskeletal:  Warm and dry, no edema    Recent Labs Lab 07/18/16 0417 07/19/16 0633 07/21/16 0731  NA 135 136 130*  K 4.2 4.6 3.8  CL 98* 98* 95*  CO2 '27 25 26  '$ BUN 38* 46* 29*  CREATININE 4.13* 4.81* 3.40*  GLUCOSE  130* 178* 168*    Recent Labs Lab 07/18/16 0417 07/19/16 0633 07/21/16 0730  HGB 7.4* 7.5* 6.3*  HCT 23.4* 23.3* 19.4*  WBC 19.8* 17.7* 14.2*  PLT 316 297 323   Dg Chest Port 1 View  Result Date: 07/21/2016 CLINICAL DATA:  Respiratory failure. EXAM: PORTABLE CHEST 1 VIEW COMPARISON:  07/19/2016. FINDINGS: Tracheostomy tube, right IJ dual-lumen catheter in stable position. Heart size stable. Interim resolution of left lower lobe  atelectasis. Diffuse bilateral pulmonary interstitial prominence again noted, no change. No pleural effusion or pneumothorax the IMPRESSION: 1.  Lines and tubes in stable position. 2. Interim resolution of left lower lobe atelectasis. Electronically Signed   By: Marcello Moores  Register   On: 07/21/2016 08:40    ASSESSMENT / PLAN:  Acute on Chronic Hypoxic Respiratory Failure status post Tracheostomy (10/20 per Dr. Redmond Baseman / ENT) Inability to Protect Airway  LLL Pneumonia / E-Coli  Obesity Hypoventilation Syndrome  PLAN -  Trach care per protocol  Clip trach sutures 10 days post insertion (10/23) ENT follow up post discharge for trach care Vent wean per select protocol  Wean PEEP / FiO2 for sats > 92% Will likely need nocturnal vent support if able to come off during the day.  High risk for recurrent atelectasis/PNA in the setting of debility / chronic illness  Intermittent f/u CXR Complete 7 days cipro, start date 10/22  Volume removal with HD per renal     Acute Encephalopathy secondary to Urosepsis Chronic Debilitation E-Coli & Klebsiella UTI - treated initially with 7 days Merrem Acute on Chronic Renal Failure secondary to sepsis - HD intermittent during admission Metabolic Acidosis - secondary to above Hyperkalemia  Lactic Acidosis - in setting of metformin and hypoperfusion with sepsis Hyperphosphatemia  Pseudo-hypocalcemia  Partial Small Bowel Obstruction - resolved History of Ventral Hernia Repair  Possible Liver Cirrhosis  Transaminitis - likely shock liver versus congestive hepatopathy  Clostridium Difficile Diarrhea Hypoglycemia  Diabetes Mellitus  Hyperthyroidism   REC -  collapase is resolved on pcxr x 48 hrs,  Reduce peep to 5 Should repeat pcxr Monday 30th  Dc mucomyst's, maintain chest pt is she tolerates x 24 hours more Wean today per protocol, likely PS 12 start Updated pt in room  [employer]. Titus Mould, MD, [place] Pgr: [facility] 07/21/2016 4:05 PM

## 2016-07-22 LAB — TYPE AND SCREEN
ABO/RH(D): O POS
ANTIBODY SCREEN: NEGATIVE
UNIT DIVISION: 0

## 2016-07-22 LAB — HEPATITIS B SURFACE ANTIGEN: HEP B S AG: NEGATIVE

## 2016-07-23 ENCOUNTER — Other Ambulatory Visit (HOSPITAL_COMMUNITY): Payer: Self-pay

## 2016-07-23 DIAGNOSIS — R112 Nausea with vomiting, unspecified: Secondary | ICD-10-CM | POA: Diagnosis not present

## 2016-07-23 LAB — BASIC METABOLIC PANEL
Anion gap: 9 (ref 5–15)
BUN: 32 mg/dL — AB (ref 6–20)
CHLORIDE: 102 mmol/L (ref 101–111)
CO2: 22 mmol/L (ref 22–32)
CREATININE: 3.13 mg/dL — AB (ref 0.44–1.00)
Calcium: 8.1 mg/dL — ABNORMAL LOW (ref 8.9–10.3)
GFR calc Af Amer: 17 mL/min — ABNORMAL LOW (ref >60)
GFR calc non Af Amer: 15 mL/min — ABNORMAL LOW (ref >60)
Glucose, Bld: 137 mg/dL — ABNORMAL HIGH (ref 65–99)
POTASSIUM: 3.8 mmol/L (ref 3.5–5.1)
SODIUM: 133 mmol/L — AB (ref 135–145)

## 2016-07-23 LAB — CBC WITH DIFFERENTIAL/PLATELET
BASOS PCT: 1 %
Basophils Absolute: 0.1 10*3/uL (ref 0.0–0.1)
EOS ABS: 0.1 10*3/uL (ref 0.0–0.7)
EOS PCT: 1 %
HEMATOCRIT: 25 % — AB (ref 36.0–46.0)
HEMOGLOBIN: 8.2 g/dL — AB (ref 12.0–15.0)
LYMPHS PCT: 13 %
Lymphs Abs: 1.5 10*3/uL (ref 0.7–4.0)
MCH: 29.6 pg (ref 26.0–34.0)
MCHC: 32.8 g/dL (ref 30.0–36.0)
MCV: 90.3 fL (ref 78.0–100.0)
MONOS PCT: 10 %
Monocytes Absolute: 1.2 10*3/uL — ABNORMAL HIGH (ref 0.1–1.0)
NEUTROS ABS: 8.9 10*3/uL — AB (ref 1.7–7.7)
NEUTROS PCT: 75 %
Platelets: 268 10*3/uL (ref 150–400)
RBC: 2.77 MIL/uL — ABNORMAL LOW (ref 3.87–5.11)
RDW: 15.4 % (ref 11.5–15.5)
WBC: 11.8 10*3/uL — ABNORMAL HIGH (ref 4.0–10.5)

## 2016-07-24 ENCOUNTER — Other Ambulatory Visit (HOSPITAL_COMMUNITY): Payer: Self-pay

## 2016-07-24 DIAGNOSIS — J155 Pneumonia due to Escherichia coli: Secondary | ICD-10-CM

## 2016-07-24 DIAGNOSIS — J9811 Atelectasis: Secondary | ICD-10-CM | POA: Diagnosis not present

## 2016-07-24 DIAGNOSIS — J9621 Acute and chronic respiratory failure with hypoxia: Secondary | ICD-10-CM

## 2016-07-24 DIAGNOSIS — K567 Ileus, unspecified: Secondary | ICD-10-CM | POA: Diagnosis not present

## 2016-07-24 NOTE — Progress Notes (Signed)
Name: Angelica Beck MRN: [id number] DOB: [birth year]    ADMISSION DATE:  07/18/2016 CONSULTATION DATE:  10/25  REFERRING MD :  Hijazi   CHIEF COMPLAINT:  Vent management   BRIEF PATIENT DESCRIPTION: 65 y.o. 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 Initially admitted 9/23 with septic shock secondary to UTI.  She required CVVHD in the setting of acute on chronic renal failure.  Last HD completed 10/21.  Mental status improved with CVVHD.  Initial urine culture was positive for klebsiella and e-coli.  She was treated with 7 days of merrem.  She was extubated 10/14 but requiring intermittent noninvasive positive pressure ventilation. She failed extubation and was re-intubated 10/16.  Course complicated by SBO (now resolved). She was found to be positive for C diff & treated with PO vancomycin. Due to poor weaning, she had a tracheostomy placed by ENT 10/20 & G-tube placement by IR on 10/23. Sputum culture grew e-coli and she was treated with Cipro.  The patient was medically cleared for discharge to [facility] on 10/24  STUDIES: CT Head 9/27 >> No acute abnormality. Chronic small vessel ischemic changes. Chronic lacunar infarct left basal ganglia &chronic ischemia right side of pons.  CT Abd/Pelvis w/o 9/27 >>Early partial SBO w/ transition point LLQ with adhesions. Left humerus soft tissue swelling &intramuscular gas. Punctate hepatic granulomas noted.  Left Arm X-ray 9/27 >>Diffuse soft tissue edema.  TTE 9/27 >>LV w/ EF 60-65%. Grade 1 Diastolic dysfunction. No regional wall motion abnormalities. LA &RA normal in size. RV normal in size &function. Mild AS w/o AR. Mild MR w/o MS.  ABIs 10/3 >>R normal. L posterior artery noncompressible; suspect calcification.  CT head 10/3 >>No acute infarct, no hemorrhage or mass.  Atrophy evident. Similar to prior study.   MICROBIOLOGY: MRSA PCR 9/27 >> Negative Blood Ctx x 2 9/26 >> Coag Neg Staph 1/2 Urine Ctx 9/26 >>5000 CFU E coli &1000 CFU Klebsiella  C diff PCR 10/10 >> Toxin &Antigen Positive Sputum 10/20 >> e-coli, sens CIPRO BC 10/20 >> E.coli  ANTIBIOTICS: Levaquin 9/26 - 9/28 Aztreonam 9/26 - 9/27 Vancomycin 9/26 - off Merrem 9/27 - 10/3 PO Vancomycin 10/10 >> Flagyl IV 10/15 (while NGT is out) >> 10/16 Cipro 10/22 >>  SIGNIFICANT EVENTS: 09/26 - Admit for presumed septic shock 10/14 - extubated 10/16 - reintubated 10/18 - HD perm cath placed 10/20 - Trach per Dr. Redmond Baseman 10/23 - PEG per IR   LINES/TUBES: OETT 7.5 9/26 - 10/14, OETT 10/16 >>10/20, TRACH 10/20 (ENT Dr Redmond Baseman)  OGT 9/26 - 10/14 OGT 10/16 >> L IJ CVL 9/26 >> Foley 9/26 >> 10/8 RIJ HD 9/30 >> 10/18 Rectal pouch 10/17 > 10/18 NG tube 10/17 >> R HD Perm Cath 10/18 >>   SUBJECTIVE:   Some vomiting over weekend.  Tol PS x 4 hours yesterday.  Remains on full support this am.    VITAL SIGNS: Reviewed. Abnormal results dicussed in Imp/plan    PHYSICAL EXAMINATION: General:  Chronically ill appearing female, NAD  Neuro:  Awake, tracks, follows some commands, moves ext HEENT:  Trach clean , no secretions Cardiovascular:  s1s2 rrr Lungs: resps even non labored on full support, diminished bases otherwise clear  Abdomen:  Round, soft, +bs  Musculoskeletal:  Warm and dry, no edema    Recent Labs Lab 07/19/16 0633 07/21/16 0731 07/23/16 0600  NA 136  130* 133*  K 4.6 3.8 3.8  CL 98* 95* 102  CO2 '25 26 22  '$ BUN 46* 29* 32*  CREATININE 4.81* 3.40* 3.13*  GLUCOSE 178* 168* 137*    Recent Labs Lab 07/19/16 0633 07/21/16 0730 07/23/16 0600  HGB 7.5* 6.3* 8.2*  HCT 23.3* 19.4* 25.0*  WBC 17.7* 14.2* 11.8*  PLT 297 323 268   Dg Abd Portable 1v  Result Date: 07/24/2016 CLINICAL DATA:  Ileus. EXAM: PORTABLE ABDOMEN - 1 VIEW COMPARISON:  07/23/2016.  FINDINGS: Gastrostomy tube noted in the left upper quadrant. Persistent distended loops of small bowel are noted. Intraluminal air appears to be within the of colon. These findings suggest adynamic ileus. Partial small bowel obstruction cannot be excluded. Findings have progressed slightly from prior exam. C prominent pelvic density consistent with prominent fibroid noted . IMPRESSION: Persistent distended loops of small bowel. Intraluminal air is noted within the colon. Findings are consistent with adynamic ileus. Partial small bowel obstruction cannot be excluded. Findings have progressed slightly from prior exam. Electronically Signed   By: [place]   On: 07/24/2016 07:13   Dg Abd Portable 1v  Result Date: 07/23/2016 CLINICAL DATA:  Nausea, vomiting. EXAM: PORTABLE ABDOMEN - 1 VIEW COMPARISON:  Radiograph of July 18, 2016. FINDINGS: Percutaneous gastrostomy tube is in grossly good position. Calcified uterine fibroid is noted in the pelvis. Mildly dilated small bowel loops are noted concerning for small bowel obstruction or ileus. Phleboliths are noted in the pelvis. IMPRESSION: Mildly dilated small bowel loops are noted concerning for distal small bowel obstruction or ileus. Follow-up radiographs are recommended. Electronically Signed   By: Marijo Conception, M.D.   On: 07/23/2016 15:13    ASSESSMENT / PLAN:  Acute on Chronic Hypoxic Respiratory Failure status post Tracheostomy (10/20 per Dr. Redmond Baseman / ENT) Inability to Protect Airway  LLL Pneumonia / E-Coli  Obesity Hypoventilation Syndrome Atx/collapse LLL- resolved on CXR 10/27  PLAN -  Trach care per protocol  Repeat CXR now  ENT follow up post discharge for trach care Vent wean per select protocol - tolerating short periods PS  Wean PEEP / FiO2 for sats > 92% Will likely need nocturnal vent support if able to come off during the day.  High risk for recurrent atelectasis/PNA in the setting of debility / chronic illness    Intermittent f/u CXR Complete 7 days cipro, start date 10/22  Volume removal with HD per renal    Acute Encephalopathy secondary to Urosepsis Chronic Debilitation E-Coli & Klebsiella UTI - treated initially with 7 days Merrem Acute on Chronic Renal Failure secondary to sepsis - HD intermittent during admission Metabolic Acidosis - secondary to above Hyperkalemia  Lactic Acidosis - in setting of metformin and hypoperfusion with sepsis Hyperphosphatemia  Pseudo-hypocalcemia  Partial Small Bowel Obstruction - resolved History of Ventral Hernia Repair  Possible Liver Cirrhosis  Transaminitis - likely shock liver versus congestive hepatopathy  Clostridium Difficile Diarrhea Hypoglycemia  Diabetes Mellitus  Hyperthyroidism   REC -  Per primary    Nickolas Madrid, NP 07/24/2016  8:57 AM Pager: [phone] or [phone]

## 2016-07-25 ENCOUNTER — Other Ambulatory Visit (HOSPITAL_COMMUNITY): Payer: Self-pay

## 2016-07-25 DIAGNOSIS — N19 Unspecified kidney failure: Secondary | ICD-10-CM | POA: Diagnosis not present

## 2016-07-25 LAB — RENAL FUNCTION PANEL
Albumin: 1.7 g/dL — ABNORMAL LOW (ref 3.5–5.0)
Anion gap: 11 (ref 5–15)
BUN: 44 mg/dL — ABNORMAL HIGH (ref 6–20)
CHLORIDE: 94 mmol/L — AB (ref 101–111)
CO2: 24 mmol/L (ref 22–32)
CREATININE: 4.65 mg/dL — AB (ref 0.44–1.00)
Calcium: 8.8 mg/dL — ABNORMAL LOW (ref 8.9–10.3)
GFR, EST AFRICAN AMERICAN: 11 mL/min — AB (ref >60)
GFR, EST NON AFRICAN AMERICAN: 9 mL/min — AB (ref >60)
Glucose, Bld: 174 mg/dL — ABNORMAL HIGH (ref 65–99)
POTASSIUM: 4.8 mmol/L (ref 3.5–5.1)
Phosphorus: 4.1 mg/dL (ref 2.5–4.6)
Sodium: 129 mmol/L — ABNORMAL LOW (ref 135–145)

## 2016-07-25 LAB — CBC
HCT: 26.3 % — ABNORMAL LOW (ref 36.0–46.0)
Hemoglobin: 8.5 g/dL — ABNORMAL LOW (ref 12.0–15.0)
MCH: 28.5 pg (ref 26.0–34.0)
MCHC: 32.3 g/dL (ref 30.0–36.0)
MCV: 88.3 fL (ref 78.0–100.0)
PLATELETS: 302 10*3/uL (ref 150–400)
RBC: 2.98 MIL/uL — AB (ref 3.87–5.11)
RDW: 15.2 % (ref 11.5–15.5)
WBC: 15.1 10*3/uL — AB (ref 4.0–10.5)

## 2016-07-25 NOTE — Progress Notes (Signed)
Subjective:  Patient underwent hemodialysis today. She tolerated well. She still remains critically ill however.  Objective:  Vital signs in last 24 hours:  Temperature 90.8 pulse 101 respirations 30 blood pressure 175/70  Physical Exam: General: Chronically ill appearing, NAD  HEENT Anicteric, moist oral mucus membranes  Neck Trach in place  Pulm/lungs Vent dependent, bilateral rhonchi, normal effort  CVS/Heart Regular, no rub  Abdomen:  PEG tube, soft NTND, BS present  Extremities: No edema, feet in soft supports  Neurologic: Arousable, not consistently following commands  Skin: No acute rashes  Access: Rt IJ PC       Basic Metabolic Panel:   Recent Labs Lab 07/19/16 0633 07/21/16 0731 07/23/16 0600 07/25/16 0900  NA 136 130* 133* 129*  K 4.6 3.8 3.8 4.8  CL 98* 95* 102 94*  CO2 '25 26 22 24  '$ GLUCOSE 178* 168* 137* 174*  BUN 46* 29* 32* 44*  CREATININE 4.81* 3.40* 3.13* 4.65*  CALCIUM 8.7* 8.6* 8.1* 8.8*  PHOS  --  3.7  --  4.1     CBC:  Recent Labs Lab 07/19/16 0633 07/21/16 0730 07/23/16 0600 07/25/16 0900  WBC 17.7* 14.2* 11.8* 15.1*  NEUTROABS  --   --  8.9*  --   HGB 7.5* 6.3* 8.2* 8.5*  HCT 23.3* 19.4* 25.0* 26.3*  MCV 89.6 89.0 90.3 88.3  PLT 297 323 268 302      Microbiology:  Recent Results (from the past 720 hour(s))  C difficile quick scan w PCR reflex     Status: Abnormal   Collection Time: 07/04/16 10:10 AM  Result Value Ref Range Status   C Diff antigen POSITIVE (A) NEGATIVE Final   C Diff toxin POSITIVE (A) NEGATIVE Final   C Diff interpretation Toxin producing C. difficile detected.  Final    Comment: CRITICAL RESULT CALLED TO, READ BACK BY AND VERIFIED WITH: L. Flood RN 11:45 07/04/16 ([email or web address])   Culture, blood (routine x 2)     Status: Abnormal   Collection Time: 07/14/16 12:48 PM  Result Value Ref Range Status   Specimen Description BLOOD LEFT HAND  Final   Special Requests IN PEDIATRIC BOTTLE 3CC  Final   Culture   Setup Time   Final    GRAM POSITIVE COCCI IN CLUSTERS IN PEDIATRIC BOTTLE CRITICAL RESULT CALLED TO, READ BACK BY AND VERIFIED WITH: J. ARMINGER, PHARM AT [place] ON [id number] BY Rhea Bleacher    Culture (A)  Final    STAPHYLOCOCCUS SPECIES (COAGULASE NEGATIVE) THE SIGNIFICANCE OF ISOLATING THIS ORGANISM FROM A SINGLE SET OF BLOOD CULTURES WHEN MULTIPLE SETS ARE DRAWN IS UNCERTAIN. PLEASE NOTIFY THE MICROBIOLOGY DEPARTMENT WITHIN ONE WEEK IF SPECIATION AND SENSITIVITIES ARE REQUIRED.    Report Status 07/17/2016 FINAL  Final  Culture, blood (routine x 2)     Status: None   Collection Time: 07/14/16 12:59 PM  Result Value Ref Range Status   Specimen Description BLOOD RIGHT HAND  Final   Special Requests IN PEDIATRIC BOTTLE 3CC  Final   Culture NO GROWTH 5 DAYS  Final   Report Status 07/19/2016 FINAL  Final  Culture, respiratory (NON-Expectorated)     Status: None   Collection Time: 07/14/16  2:55 PM  Result Value Ref Range Status   Specimen Description TRACHEAL ASPIRATE  Final   Special Requests NONE  Final   Gram Stain   Final    ABUNDANT WBC PRESENT, PREDOMINANTLY PMN FEW GRAM NEGATIVE RODS RARE GRAM POSITIVE COCCI IN PAIRS  Culture FEW ESCHERICHIA COLI  Final   Report Status 07/16/2016 FINAL  Final   Organism ID, Bacteria ESCHERICHIA COLI  Final      Susceptibility   Escherichia coli - MIC*    AMPICILLIN >=32 RESISTANT Resistant     CEFAZOLIN <=4 SENSITIVE Sensitive     CEFEPIME <=1 SENSITIVE Sensitive     CEFTAZIDIME <=1 SENSITIVE Sensitive     CEFTRIAXONE <=1 SENSITIVE Sensitive     CIPROFLOXACIN 1 SENSITIVE Sensitive     GENTAMICIN <=1 SENSITIVE Sensitive     IMIPENEM <=0.25 SENSITIVE Sensitive     TRIMETH/SULFA >=320 RESISTANT Resistant     AMPICILLIN/SULBACTAM 16 INTERMEDIATE Intermediate     PIP/TAZO <=4 SENSITIVE Sensitive     Extended ESBL NEGATIVE Sensitive     * FEW ESCHERICHIA COLI  Culture, respiratory (NON-Expectorated)     Status: None   Collection Time:  07/18/16  8:04 PM  Result Value Ref Range Status   Specimen Description TRACHEAL ASPIRATE  Final   Special Requests NONE  Final   Gram Stain   Final    MODERATE WBC PRESENT,BOTH PMN AND MONONUCLEAR NO ORGANISMS SEEN    Culture RARE ESCHERICHIA COLI  Final   Report Status 07/21/2016 FINAL  Final   Organism ID, Bacteria ESCHERICHIA COLI  Final      Susceptibility   Escherichia coli - MIC*    AMPICILLIN >=32 RESISTANT Resistant     CEFAZOLIN <=4 SENSITIVE Sensitive     CEFEPIME <=1 SENSITIVE Sensitive     CEFTAZIDIME <=1 SENSITIVE Sensitive     CEFTRIAXONE <=1 SENSITIVE Sensitive     CIPROFLOXACIN 1 SENSITIVE Sensitive     GENTAMICIN <=1 SENSITIVE Sensitive     IMIPENEM <=0.25 SENSITIVE Sensitive     TRIMETH/SULFA >=320 RESISTANT Resistant     AMPICILLIN/SULBACTAM 16 INTERMEDIATE Intermediate     PIP/TAZO <=4 SENSITIVE Sensitive     Extended ESBL NEGATIVE Sensitive     * RARE ESCHERICHIA COLI    Coagulation Studies: No results for input(s): LABPROT, INR in the last 72 hours.  Urinalysis: No results for input(s): COLORURINE, LABSPEC, PHURINE, GLUCOSEU, HGBUR, BILIRUBINUR, KETONESUR, PROTEINUR, UROBILINOGEN, NITRITE, LEUKOCYTESUR in the last 72 hours.  Invalid input(s): APPERANCEUR    Imaging: Dg Chest Port 1 View  Result Date: 07/24/2016 CLINICAL DATA:  Left lung atelectasis EXAM: PORTABLE CHEST 1 VIEW COMPARISON:  07/21/2016 FINDINGS: Cardiomediastinal silhouette is stable. Tracheostomy tube is unchanged in position. No infiltrate or pulmonary edema. Stable left subclavian central line position. Stable right IJ dual-lumen catheter with tip in right atrium. Mild left basilar atelectasis. IMPRESSION: Stable support apparatus. Mild left basilar atelectasis. No infiltrate or pulmonary edema. Electronically Signed   By: Lahoma Crocker M.D.   On: 07/24/2016 09:28   Dg Abd Portable 1v  Result Date: 07/25/2016 CLINICAL DATA:  Sepsis, renal failure, diabetes. EXAM: PORTABLE ABDOMEN - 1  VIEW COMPARISON:  KUB of July 24, 2016 FINDINGS: There remain loops of moderately distended gas-filled small bowel. There is gas within normal calibered colonic loops. No free extraluminal gas collections are observed. A PEG tube is in place. There is a densely calcified uterine fibroid. The lung bases are grossly clear. There is severe degenerative change of the right hip. IMPRESSION: Fairly stable appearance of the mildly distended gas-filled small bowel compatible with an ileus. Electronically Signed   By: David  Martinique M.D.   On: 07/25/2016 08:42   Dg Abd Portable 1v  Result Date: 07/24/2016 CLINICAL DATA:  Ileus. EXAM: PORTABLE ABDOMEN -  1 VIEW COMPARISON:  07/23/2016. FINDINGS: Gastrostomy tube noted in the left upper quadrant. Persistent distended loops of small bowel are noted. Intraluminal air appears to be within the of colon. These findings suggest adynamic ileus. Partial small bowel obstruction cannot be excluded. Findings have progressed slightly from prior exam. C prominent pelvic density consistent with prominent fibroid noted . IMPRESSION: Persistent distended loops of small bowel. Intraluminal air is noted within the colon. Findings are consistent with adynamic ileus. Partial small bowel obstruction cannot be excluded. Findings have progressed slightly from prior exam. Electronically Signed   By: [place]   On: 07/24/2016 07:13     Medications:     . 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 Acute renal failure  Likely secondary to ATN from septic shock CRRT initially started on September 30.  Subsequently, Transitioned to intermittent hemodialysis. -  Patient completed hemodialysis today. We will plan for dialysis again on Thursday.  2. Acute respiratory failure - overall remains critically ill and remains vent dependent. Continue ventilatory support as prescribed.  3. Pneumonia Tracheal aspirate from October 20 is growing Escherichia coli Antibiotics as per internal medicine team Pharmacy to adjust dosing for HD  4. Malnutrition Albumin 1.6  5. Anemia Hemoglobin improved to 8.5. Special for blood transfusion is 7.   LOS: 0 Angelica Beck 10/31/20174:01 PM

## 2016-07-26 ENCOUNTER — Other Ambulatory Visit (HOSPITAL_COMMUNITY): Payer: Self-pay

## 2016-07-26 DIAGNOSIS — K567 Ileus, unspecified: Secondary | ICD-10-CM | POA: Diagnosis not present

## 2016-07-26 LAB — TSH: TSH: 0.998 u[IU]/mL (ref 0.350–4.500)

## 2016-07-26 NOTE — Progress Notes (Signed)
Subjective:  Patient resting comfortably in bed. She remains on the ventilator. She is due for hemodialysis again tomorrow.  Objective:  Vital signs in last 24 hours:  Pulse 94 respirations 16 blood pressure 128/75 pulse ox 100%  Physical Exam: General: Chronically ill appearing, NAD  HEENT Anicteric, moist oral mucus membranes  Neck Trach in place  Pulm/lungs Vent dependent, bilateral rhonchi, normal effort  CVS/Heart Regular, no rub  Abdomen:  PEG tube, soft NTND, BS present  Extremities: No edema, feet in soft supports  Neurologic: Arousable, not consistently following commands  Skin: No acute rashes  Access: Rt IJ PC       Basic Metabolic Panel:   Recent Labs Lab 07/21/16 0731 07/23/16 0600 07/25/16 0900  NA 130* 133* 129*  K 3.8 3.8 4.8  CL 95* 102 94*  CO2 '26 22 24  '$ GLUCOSE 168* 137* 174*  BUN 29* 32* 44*  CREATININE 3.40* 3.13* 4.65*  CALCIUM 8.6* 8.1* 8.8*  PHOS 3.7  --  4.1     CBC:  Recent Labs Lab 07/21/16 0730 07/23/16 0600 07/25/16 0900  WBC 14.2* 11.8* 15.1*  NEUTROABS  --  8.9*  --   HGB 6.3* 8.2* 8.5*  HCT 19.4* 25.0* 26.3*  MCV 89.0 90.3 88.3  PLT 323 268 302      Microbiology:  Recent Results (from the past 720 hour(s))  C difficile quick scan w PCR reflex     Status: Abnormal   Collection Time: 07/04/16 10:10 AM  Result Value Ref Range Status   C Diff antigen POSITIVE (A) NEGATIVE Final   C Diff toxin POSITIVE (A) NEGATIVE Final   C Diff interpretation Toxin producing C. difficile detected.  Final    Comment: CRITICAL RESULT CALLED TO, READ BACK BY AND VERIFIED WITH: L. Flood RN 11:45 07/04/16 (wilsonm)   Culture, blood (routine x 2)     Status: Abnormal   Collection Time: 07/14/16 12:48 PM  Result Value Ref Range Status   Specimen Description BLOOD LEFT HAND  Final   Special Requests IN PEDIATRIC BOTTLE 3CC  Final   Culture  Setup Time   Final    GRAM POSITIVE COCCI IN CLUSTERS IN PEDIATRIC BOTTLE CRITICAL RESULT  CALLED TO, READ BACK BY AND VERIFIED WITH: J. ARMINGER, PHARM AT [place] [id number] BY Rhea Bleacher    Culture (A)  Final    STAPHYLOCOCCUS SPECIES (COAGULASE NEGATIVE) THE SIGNIFICANCE OF ISOLATING THIS ORGANISM FROM A SINGLE SET OF BLOOD CULTURES WHEN MULTIPLE SETS ARE DRAWN IS UNCERTAIN. PLEASE NOTIFY THE MICROBIOLOGY DEPARTMENT WITHIN ONE WEEK IF SPECIATION AND SENSITIVITIES ARE REQUIRED.    Report Status 07/17/2016 FINAL  Final  Culture, blood (routine x 2)     Status: None   Collection Time: 07/14/16 12:59 PM  Result Value Ref Range Status   Specimen Description BLOOD RIGHT HAND  Final   Special Requests IN PEDIATRIC BOTTLE 3CC  Final   Culture NO GROWTH 5 DAYS  Final   Report Status 07/19/2016 FINAL  Final  Culture, respiratory (NON-Expectorated)     Status: None   Collection Time: 07/14/16  2:55 PM  Result Value Ref Range Status   Specimen Description TRACHEAL ASPIRATE  Final   Special Requests NONE  Final   Gram Stain   Final    ABUNDANT WBC PRESENT, PREDOMINANTLY PMN FEW GRAM NEGATIVE RODS RARE GRAM POSITIVE COCCI IN PAIRS    Culture FEW ESCHERICHIA COLI  Final   Report Status 07/16/2016 FINAL  Final  Organism ID, Bacteria ESCHERICHIA COLI  Final      Susceptibility   Escherichia coli - MIC*    AMPICILLIN >=32 RESISTANT Resistant     CEFAZOLIN <=4 SENSITIVE Sensitive     CEFEPIME <=1 SENSITIVE Sensitive     CEFTAZIDIME <=1 SENSITIVE Sensitive     CEFTRIAXONE <=1 SENSITIVE Sensitive     CIPROFLOXACIN 1 SENSITIVE Sensitive     GENTAMICIN <=1 SENSITIVE Sensitive     IMIPENEM <=0.25 SENSITIVE Sensitive     TRIMETH/SULFA >=320 RESISTANT Resistant     AMPICILLIN/SULBACTAM 16 INTERMEDIATE Intermediate     PIP/TAZO <=4 SENSITIVE Sensitive     Extended ESBL NEGATIVE Sensitive     * FEW ESCHERICHIA COLI  Culture, respiratory (NON-Expectorated)     Status: None   Collection Time: 07/18/16  8:04 PM  Result Value Ref Range Status   Specimen Description TRACHEAL ASPIRATE  Final    Special Requests NONE  Final   Gram Stain   Final    MODERATE WBC PRESENT,BOTH PMN AND MONONUCLEAR NO ORGANISMS SEEN    Culture RARE ESCHERICHIA COLI  Final   Report Status 07/21/2016 FINAL  Final   Organism ID, Bacteria ESCHERICHIA COLI  Final      Susceptibility   Escherichia coli - MIC*    AMPICILLIN >=32 RESISTANT Resistant     CEFAZOLIN <=4 SENSITIVE Sensitive     CEFEPIME <=1 SENSITIVE Sensitive     CEFTAZIDIME <=1 SENSITIVE Sensitive     CEFTRIAXONE <=1 SENSITIVE Sensitive     CIPROFLOXACIN 1 SENSITIVE Sensitive     GENTAMICIN <=1 SENSITIVE Sensitive     IMIPENEM <=0.25 SENSITIVE Sensitive     TRIMETH/SULFA >=320 RESISTANT Resistant     AMPICILLIN/SULBACTAM 16 INTERMEDIATE Intermediate     PIP/TAZO <=4 SENSITIVE Sensitive     Extended ESBL NEGATIVE Sensitive     * RARE ESCHERICHIA COLI    Coagulation Studies: No results for input(s): LABPROT, INR in the last 72 hours.  Urinalysis: No results for input(s): COLORURINE, LABSPEC, PHURINE, GLUCOSEU, HGBUR, BILIRUBINUR, KETONESUR, PROTEINUR, UROBILINOGEN, NITRITE, LEUKOCYTESUR in the last 72 hours.  Invalid input(s): APPERANCEUR    Imaging: Dg Abd Portable 1v  Result Date: 07/25/2016 CLINICAL DATA:  Sepsis, renal failure, diabetes. EXAM: PORTABLE ABDOMEN - 1 VIEW COMPARISON:  KUB of July 24, 2016 FINDINGS: There remain loops of moderately distended gas-filled small bowel. There is gas within normal calibered colonic loops. No free extraluminal gas collections are observed. A PEG tube is in place. There is a densely calcified uterine fibroid. The lung bases are grossly clear. There is severe degenerative change of the right hip. IMPRESSION: Fairly stable appearance of the mildly distended gas-filled small bowel compatible with an ileus. Electronically Signed   By: David  Martinique M.D.   On: 07/25/2016 08:42     Medications:     . diatrizoate meglumine-sodium  30 mL Oral Once     Assessment/ Plan:  65 y.o.African  American  female with history of stroke, left-sided weakness, CT with chronic small vessel ischemic changes, chronic lacunar infarct left basal ganglia, chronic ischemia the right side of pons, diastolic dysfunction with normal EF of 60-65%, diabetes, chronic kidney disease, chronic urinary tract infections due to incontinence  1. Acute renal failure  Likely secondary to ATN from septic shock CRRT initially started on September 30. Subsequently, Transitioned to intermittent hemodialysis. -  Hemodialysis was performed yesterday. No acute indication for dialysis today. We will plan for dialysis again tomorrow.  2. Acute respiratory failure -  continue ventilator support at the moment. Continue ultrafiltration to help with weaning from the ventilator.  3. Pneumonia Tracheal aspirate from October 20 is growing Escherichia coli Antibiotics as per internal medicine team Pharmacy to adjust dosing for HD  4. Malnutrition Albumin 1.7, nutrition team following.  5. Anemia - continue to monitor hemoglobin. Most recent hemoglobin was 8.5. Hemoglobin has been as low as 6.3 during this admission.   LOS: 0 Angelica Beck 11/1/201710:40 AM

## 2016-07-27 DIAGNOSIS — E662 Morbid (severe) obesity with alveolar hypoventilation: Secondary | ICD-10-CM

## 2016-07-27 LAB — CBC
HCT: 25.8 % — ABNORMAL LOW (ref 36.0–46.0)
Hemoglobin: 8.3 g/dL — ABNORMAL LOW (ref 12.0–15.0)
MCH: 28.8 pg (ref 26.0–34.0)
MCHC: 32.2 g/dL (ref 30.0–36.0)
MCV: 89.6 fL (ref 78.0–100.0)
Platelets: 303 10*3/uL (ref 150–400)
RBC: 2.88 MIL/uL — ABNORMAL LOW (ref 3.87–5.11)
RDW: 14.9 % (ref 11.5–15.5)
WBC: 12.9 10*3/uL — ABNORMAL HIGH (ref 4.0–10.5)

## 2016-07-27 NOTE — Progress Notes (Signed)
Name: Angelica Beck MRN: [id number] DOB: 1950/12/20    ADMISSION DATE:  07/18/2016 CONSULTATION DATE:  10/25  REFERRING MD :  Hijazi   CHIEF COMPLAINT:  Vent management   BRIEF PATIENT DESCRIPTION: 65 y.o. 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 Initially admitted 9/23 with septic shock secondary to UTI.  She required CVVHD in the setting of acute on chronic renal failure.  Last HD completed 10/21.  Mental status improved with CVVHD.  Initial urine culture was positive for klebsiella and e-coli.  She was treated with 7 days of merrem.  She was extubated 10/14 but requiring intermittent noninvasive positive pressure ventilation. She failed extubation and was re-intubated 10/16.  Course complicated by SBO (now resolved). She was found to be positive for C diff & treated with PO vancomycin. Due to poor weaning, she had a tracheostomy placed by ENT 10/20 & G-tube placement by IR on 10/23. Sputum culture grew e-coli and she was treated with Cipro.  The patient was medically cleared for discharge to [facility] on 10/24  STUDIES: CT Head 9/27 >> No acute abnormality. Chronic small vessel ischemic changes. Chronic lacunar infarct left basal ganglia &chronic ischemia right side of pons.  CT Abd/Pelvis w/o 9/27 >>Early partial SBO w/ transition point LLQ with adhesions. Left humerus soft tissue swelling &intramuscular gas. Punctate hepatic granulomas noted.  Left Arm X-ray 9/27 >>Diffuse soft tissue edema.  TTE 9/27 >>LV w/ EF 60-65%. Grade 1 Diastolic dysfunction. No regional wall motion abnormalities. LA &RA normal in size. RV normal in size &function. Mild AS w/o AR. Mild MR w/o MS.  ABIs 10/3 >>R normal. L posterior artery noncompressible; suspect calcification.  CT head 10/3 >>No acute infarct, no hemorrhage or mass.  Atrophy evident. Similar to prior study.   MICROBIOLOGY: MRSA PCR 9/27 >> Negative Blood Ctx x 2 9/26 >> Coag Neg Staph 1/2 Urine Ctx 9/26 >>5000 CFU E coli &1000 CFU Klebsiella  C diff PCR 10/10 >> Toxin &Antigen Positive Sputum 10/20 >> e-coli, sens CIPRO BC 10/20 >> E.coli  ANTIBIOTICS: Levaquin 9/26 - 9/28 Aztreonam 9/26 - 9/27 Vancomycin 9/26 - off Merrem 9/27 - 10/3 PO Vancomycin 10/10 >> Flagyl IV 10/15 (while NGT is out) >> 10/16 Cipro 10/22 >>  SIGNIFICANT EVENTS: 09/26 - Admit for presumed septic shock 10/14 - extubated 10/16 - reintubated 10/18 - HD perm cath placed 10/20 - Trach per Dr. Redmond Baseman 10/23 - PEG per IR   LINES/TUBES: OETT 7.5 9/26 - 10/14, OETT 10/16 >>10/20, TRACH 10/20 (ENT Dr Redmond Baseman)  OGT 9/26 - 10/14 OGT 10/16 >> L IJ CVL 9/26 >> Foley 9/26 >> 10/8 RIJ HD 9/30 >> 10/18 Rectal pouch 10/17 > 10/18 NG tube 10/17 >> R HD Perm Cath 10/18 >>  SUBJECTIVE:   Patient remains on ventilator support. Just underwent hemodialysis today. Patient nods no to any pain or difficulty breathing. Nods no to any nausea.   REVIEW OF SYSTEMS:  Unable to obtain with patient on ventilator.   VITAL SIGNS:  Pulse 121bpm  Respiratory Rate 22  Sat 100%  BP 130/71  VENTILATOR:  Pressure Support  FiO2 0.28  PEEP 5  PHYSICAL EXAMINATION: General:  Elderly female. No family at bedside. Obese. Neuro:  Awake. No distress. Nods to questions.  HEENT:  No scleral icterus. Moist mucus membranes. Tracheostomy in place. Cardiovascular:  Regular rate. No appreciable JVD.  Lungs: Clear with auscultation bilaterally. Slightly decreased breath sounds in bases. Symmetric chest rise on ventilator.   Abdomen:  Soft. Normal bowel sounds. Protuberant.  Musculoskeletal:  Warm and dry. No rash on exposed skin.    Recent Labs Lab 07/21/16 0731 07/23/16 0600 07/25/16 0900  NA 130* 133* 129*  K 3.8 3.8 4.8  CL 95* 102 94*  CO2 '26 22 24  '$ BUN 29* 32* 44*  CREATININE 3.40* 3.13*  4.65*  GLUCOSE 168* 137* 174*    Recent Labs Lab 07/23/16 0600 07/25/16 0900 07/27/16 0630  HGB 8.2* 8.5* 8.3*  HCT 25.0* 26.3* 25.8*  WBC 11.8* 15.1* 12.9*  PLT 268 302 303   Dg Abd Portable 1v  Result Date: 07/26/2016 CLINICAL DATA:  Ileus. EXAM: PORTABLE ABDOMEN - 1 VIEW COMPARISON:  07/25/2016 . FINDINGS: Soft tissue structures are unremarkable. Persistent distention of small bowel. Colon is nondistended. These findings may be secondary to adynamic ileus however partial small bowel obstruction cannot be excluded. Gastrostomy tube is in the stomach. No gastric distention. No free air. Large calcified density in the pelvis consistent calcified fibroid. Pelvic calcifications consistent phleboliths . Degenerative changes lumbar spine and both hips. IMPRESSION: 1. Persistent prominent distention of small bowel. Colonic gas pattern is unremarkable. Although findings may be secondary adynamic ileus, partial small bowel obstruction cannot be excluded. Follow-up exam suggested to demonstrate resolution. 2. Gastrostomy tube noted good anatomic position. No gastric distention. Electronically Signed   By: Marcello Moores  Register   On: 07/26/2016 12:56    ASSESSMENT / PLAN:  65 y.o. female with acute on chronic hypoxic respiratory failure. Patient underwent tracheostomy on 10/20 by ENT. She has completed a seven-day course of treatment for Escherichia coli pneumonia with ciprofloxacin. Patient continuing on pressure support wean and per hemodialysis nurse today did tolerate dialysis.  1. Acute on chronic hypoxic respiratory failure: Recommend continued pressure support wean while transitioning over to tracheostomy collar eventually. Continue weaning FiO2 for saturation greater than 92%. If patient able to transition to tracheostomy collar I would recommend full ventilator support as well as nocturnal ventilator support, not pressure support ventilation. 2. Post tracheostomy care: Patient will need follow-up by  ENT for further and ongoing care. 3. Obesity hypoventilation syndrome: Patient will likely need chronic nocturnal ventilation or noninvasive positive pressure ventilation if she is decannulated.  Remainder of care as per primary service.   Sonia Baller Ashok Cordia, M.D. [facility] Pager:  [phone] After 3pm or if no response, call [phone] 07/27/2016  1:13 PM

## 2016-07-28 NOTE — Progress Notes (Signed)
Subjective:  Patient continues to be arousable but is not following commands. Hemodialysis to be performed again tomorrow.   Objective:  Vital signs in last 24 hours:  Temperature 97.9 pulse 1. 1. Respirations and blood pressure 165/91  Physical Exam: General: Chronically ill appearing, NAD  HEENT Anicteric, moist oral mucus membranes  Neck Trach in place  Pulm/lungs Vent dependent, bilateral rhonchi, normal effort  CVS/Heart Regular, no rub  Abdomen:  PEG tube, soft NTND, BS present  Extremities: No edema, feet in soft supports  Neurologic: Arousable, not consistently following commands  Skin: No acute rashes  Access: Rt IJ PC       Basic Metabolic Panel:   Recent Labs Lab 07/23/16 0600 07/25/16 0900  NA 133* 129*  K 3.8 4.8  CL 102 94*  CO2 22 24  GLUCOSE 137* 174*  BUN 32* 44*  CREATININE 3.13* 4.65*  CALCIUM 8.1* 8.8*  PHOS  --  4.1     CBC:  Recent Labs Lab 07/23/16 0600 07/25/16 0900 07/27/16 0630  WBC 11.8* 15.1* 12.9*  NEUTROABS 8.9*  --   --   HGB 8.2* 8.5* 8.3*  HCT 25.0* 26.3* 25.8*  MCV 90.3 88.3 89.6  PLT 268 302 303      Microbiology:  Recent Results (from the past 720 hour(s))  C difficile quick scan w PCR reflex     Status: Abnormal   Collection Time: 07/04/16 10:10 AM  Result Value Ref Range Status   C Diff antigen POSITIVE (A) NEGATIVE Final   C Diff toxin POSITIVE (A) NEGATIVE Final   C Diff interpretation Toxin producing C. difficile detected.  Final    Comment: CRITICAL RESULT CALLED TO, READ BACK BY AND VERIFIED WITH: L. Flood RN 11:45 07/04/16 (wilsonm)   Culture, blood (routine x 2)     Status: Abnormal   Collection Time: 07/14/16 12:48 PM  Result Value Ref Range Status   Specimen Description BLOOD LEFT HAND  Final   Special Requests IN PEDIATRIC BOTTLE 3CC  Final   Culture  Setup Time   Final    GRAM POSITIVE COCCI IN CLUSTERS IN PEDIATRIC BOTTLE CRITICAL RESULT CALLED TO, READ BACK BY AND VERIFIED WITH: J.  ARMINGER, PHARM AT [place] ON [id number] BY Rhea Bleacher    Culture (A)  Final    STAPHYLOCOCCUS SPECIES (COAGULASE NEGATIVE) THE SIGNIFICANCE OF ISOLATING THIS ORGANISM FROM A SINGLE SET OF BLOOD CULTURES WHEN MULTIPLE SETS ARE DRAWN IS UNCERTAIN. PLEASE NOTIFY THE MICROBIOLOGY DEPARTMENT WITHIN ONE WEEK IF SPECIATION AND SENSITIVITIES ARE REQUIRED.    Report Status 07/17/2016 FINAL  Final  Culture, blood (routine x 2)     Status: None   Collection Time: 07/14/16 12:59 PM  Result Value Ref Range Status   Specimen Description BLOOD RIGHT HAND  Final   Special Requests IN PEDIATRIC BOTTLE 3CC  Final   Culture NO GROWTH 5 DAYS  Final   Report Status 07/19/2016 FINAL  Final  Culture, respiratory (NON-Expectorated)     Status: None   Collection Time: 07/14/16  2:55 PM  Result Value Ref Range Status   Specimen Description TRACHEAL ASPIRATE  Final   Special Requests NONE  Final   Gram Stain   Final    ABUNDANT WBC PRESENT, PREDOMINANTLY PMN FEW GRAM NEGATIVE RODS RARE GRAM POSITIVE COCCI IN PAIRS    Culture FEW ESCHERICHIA COLI  Final   Report Status 07/16/2016 FINAL  Final   Organism ID, Bacteria ESCHERICHIA COLI  Final  Susceptibility   Escherichia coli - MIC*    AMPICILLIN >=32 RESISTANT Resistant     CEFAZOLIN <=4 SENSITIVE Sensitive     CEFEPIME <=1 SENSITIVE Sensitive     CEFTAZIDIME <=1 SENSITIVE Sensitive     CEFTRIAXONE <=1 SENSITIVE Sensitive     CIPROFLOXACIN 1 SENSITIVE Sensitive     GENTAMICIN <=1 SENSITIVE Sensitive     IMIPENEM <=0.25 SENSITIVE Sensitive     TRIMETH/SULFA >=320 RESISTANT Resistant     AMPICILLIN/SULBACTAM 16 INTERMEDIATE Intermediate     PIP/TAZO <=4 SENSITIVE Sensitive     Extended ESBL NEGATIVE Sensitive     * FEW ESCHERICHIA COLI  Culture, respiratory (NON-Expectorated)     Status: None   Collection Time: 07/18/16  8:04 PM  Result Value Ref Range Status   Specimen Description TRACHEAL ASPIRATE  Final   Special Requests NONE  Final   Gram Stain    Final    MODERATE WBC PRESENT,BOTH PMN AND MONONUCLEAR NO ORGANISMS SEEN    Culture RARE ESCHERICHIA COLI  Final   Report Status 07/21/2016 FINAL  Final   Organism ID, Bacteria ESCHERICHIA COLI  Final      Susceptibility   Escherichia coli - MIC*    AMPICILLIN >=32 RESISTANT Resistant     CEFAZOLIN <=4 SENSITIVE Sensitive     CEFEPIME <=1 SENSITIVE Sensitive     CEFTAZIDIME <=1 SENSITIVE Sensitive     CEFTRIAXONE <=1 SENSITIVE Sensitive     CIPROFLOXACIN 1 SENSITIVE Sensitive     GENTAMICIN <=1 SENSITIVE Sensitive     IMIPENEM <=0.25 SENSITIVE Sensitive     TRIMETH/SULFA >=320 RESISTANT Resistant     AMPICILLIN/SULBACTAM 16 INTERMEDIATE Intermediate     PIP/TAZO <=4 SENSITIVE Sensitive     Extended ESBL NEGATIVE Sensitive     * RARE ESCHERICHIA COLI    Coagulation Studies: No results for input(s): LABPROT, INR in the last 72 hours.  Urinalysis: No results for input(s): COLORURINE, LABSPEC, PHURINE, GLUCOSEU, HGBUR, BILIRUBINUR, KETONESUR, PROTEINUR, UROBILINOGEN, NITRITE, LEUKOCYTESUR in the last 72 hours.  Invalid input(s): APPERANCEUR    Imaging: No results found.   Medications:     . 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 Acute renal failure  Likely secondary to ATN from septic shock CRRT initially started on September 30. Subsequently, Transitioned to intermittent hemodialysis. -  Patient due for hemodialysis again tomorrow.  We will prepare orders.  2. Acute respiratory failure - reviewed pulmonary critical care note.  They recommend continued pressure support while transitioning over  tracheostomy collar eventually.  3. Pneumonia Tracheal aspirate from October 20 is growing Escherichia coli Antibiotics as per internal medicine team Pharmacy to adjust dosing for HD  4. Malnutrition Albumin remains very low at 1.7.  Patient has severe malnutrition.  Continue nutritional support per nutrition team recommendations.  5. Anemia - hemoglobin slightly lower today at 8.3.  Patient has had recent transfusion.  Continue to monitor CBC.   LOS: 0 Clarity Ciszek 11/3/20171:49 PM

## 2016-07-29 LAB — RENAL FUNCTION PANEL
Albumin: 1.7 g/dL — ABNORMAL LOW (ref 3.5–5.0)
Anion gap: 11 (ref 5–15)
BUN: 14 mg/dL (ref 6–20)
CHLORIDE: 95 mmol/L — AB (ref 101–111)
CO2: 24 mmol/L (ref 22–32)
CREATININE: 2.96 mg/dL — AB (ref 0.44–1.00)
Calcium: 8.5 mg/dL — ABNORMAL LOW (ref 8.9–10.3)
GFR calc Af Amer: 18 mL/min — ABNORMAL LOW (ref >60)
GFR, EST NON AFRICAN AMERICAN: 16 mL/min — AB (ref >60)
GLUCOSE: 135 mg/dL — AB (ref 65–99)
Phosphorus: 4 mg/dL (ref 2.5–4.6)
Potassium: 3.5 mmol/L (ref 3.5–5.1)
Sodium: 130 mmol/L — ABNORMAL LOW (ref 135–145)

## 2016-07-29 LAB — CBC
HCT: 24.5 % — ABNORMAL LOW (ref 36.0–46.0)
Hemoglobin: 7.8 g/dL — ABNORMAL LOW (ref 12.0–15.0)
MCH: 28.5 pg (ref 26.0–34.0)
MCHC: 31.8 g/dL (ref 30.0–36.0)
MCV: 89.4 fL (ref 78.0–100.0)
PLATELETS: 311 10*3/uL (ref 150–400)
RBC: 2.74 MIL/uL — ABNORMAL LOW (ref 3.87–5.11)
RDW: 14.7 % (ref 11.5–15.5)
WBC: 10.1 10*3/uL (ref 4.0–10.5)

## 2016-07-31 NOTE — Progress Notes (Signed)
Name: Angelica Angelica MRN: [id number] DOB: [birth year]    ADMISSION DATE:  07/18/2016 CONSULTATION DATE:  10/25  REFERRING MD :  Hijazi   CHIEF COMPLAINT:  Vent management   BRIEF PATIENT DESCRIPTION: 65 y.o. 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 Initially admitted 9/23 with septic shock secondary to UTI.  She required CVVHD in the setting of acute on chronic renal failure.  Last HD completed 10/21.  Mental status improved with CVVHD.  Initial urine culture was positive for klebsiella and e-coli.  She was treated with 7 days of merrem.  She was extubated 10/14 but requiring intermittent noninvasive positive pressure ventilation. She failed extubation and was re-intubated 10/16.  Course complicated by SBO (now resolved). She was found to be positive for C diff & treated with PO vancomycin. Due to poor weaning, she had a tracheostomy placed by ENT 10/20 & G-tube placement by IR on 10/23. Sputum culture grew e-coli and she was treated with Cipro.  The patient was medically cleared for discharge to [facility] on 10/24  STUDIES: CT Head 9/27 >> No acute abnormality. Chronic small vessel ischemic changes. Chronic lacunar infarct left basal ganglia &chronic ischemia right side of pons.  CT Abd/Pelvis w/o 9/27 >>Early partial SBO w/ transition point LLQ with adhesions. Left humerus soft tissue swelling &intramuscular gas. Punctate hepatic granulomas noted.  Left Arm X-ray 9/27 >>Diffuse soft tissue edema.  TTE 9/27 >>LV w/ EF 60-65%. Grade 1 Diastolic dysfunction. No regional wall motion abnormalities. LA &RA normal in size. RV normal in size &function. Mild AS w/o AR. Mild MR w/o MS.  ABIs 10/3 >>R normal. L posterior artery noncompressible; suspect calcification.  CT head 10/3 >>No acute infarct, no hemorrhage or mass.  Atrophy evident. Similar to prior study.   MICROBIOLOGY: MRSA PCR 9/27 >> Negative Blood Ctx x 2 9/26 >> Coag Neg Staph 1/2 Urine Ctx 9/26 >>5000 CFU E coli &1000 CFU Klebsiella  C diff PCR 10/10 >> Toxin &Antigen Positive Sputum 10/20 >> e-coli, sens CIPRO BC 10/20 >> E.coli  ANTIBIOTICS: Levaquin 9/26 - 9/28 Aztreonam 9/26 - 9/27 Vancomycin 9/26 - off Merrem 9/27 - 10/3 PO Vancomycin 10/10 >> Flagyl IV 10/15 (while NGT is out) >> 10/16 Cipro 10/22 >>  SIGNIFICANT EVENTS: 09/26 - Admit for presumed septic shock 10/14 - extubated 10/16 - reintubated 10/18 - HD perm cath placed 10/20 - Trach per Dr. Redmond Baseman 10/23 - PEG per IR  11/5 > ATC x 24 hours, tolerated well.  LINES/TUBES: OETT 7.5 9/26 - 10/14, OETT 10/16 >>10/20, TRACH 10/20 (ENT Dr Redmond Baseman)  OGT 9/26 - 10/14 OGT 10/16 >> L IJ CVL 9/26 >> Foley 9/26 >> 10/8 RIJ HD 9/30 >> 10/18 Rectal pouch 10/17 > 10/18 NG tube 10/17 >> R HD Perm Cath 10/18 >>  SUBJECTIVE:   Tolerated ATC x 24 hours as of this morning, no issues.  VITAL SIGNS:  P80, RR 20, SpO2 95%, BP 154/80.   PHYSICAL EXAMINATION: General:  Elderly female. No family at bedside. Obese. Neuro:  Awake. No distress. Nods to questions.  HEENT:  No scleral icterus. Moist mucus membranes. Tracheostomy in place. Cardiovascular:  Regular rate. No appreciable JVD.  Lungs: Respirations unlabored.  Clear bilaterally. Abdomen:  Soft. Normal bowel sounds. Protuberant.  Musculoskeletal:  Warm and dry. No rash on exposed skin.    Recent Labs Lab 07/25/16 0900 07/29/16  0500  NA 129* 130*  K 4.8 3.5  CL 94* 95*  CO2 24 24  BUN 44* 14  CREATININE 4.65* 2.96*  GLUCOSE 174* 135*    Recent Labs Lab 07/25/16 0900 07/27/16 0630 07/29/16 0430  HGB 8.5* 8.3* 7.8*  HCT 26.3* 25.8* 24.5*  WBC 15.1* 12.9* 10.1  PLT 302 303 311   No results found.  ASSESSMENT / PLAN:  65 y.o. female with acute on chronic hypoxic respiratory failure. Patient underwent  tracheostomy on 10/20 by ENT. She has completed a seven-day course of treatment for Escherichia coli pneumonia with ciprofloxacin. Patient tolerating ATC well x 24 hours now and continues HD treatments.  1. Acute on chronic hypoxic respiratory failure: Recommend continued ATC as she is tolerating 24 hours.  Drop cuff today and start PMV trials per SLP protocol.\ 2. Post tracheostomy care: Patient will need follow-up by ENT for further and ongoing care. 3. Obesity hypoventilation syndrome: Patient will likely need chronic nocturnal ventilation or noninvasive positive pressure ventilation if she is decannulated.  Nothing further to add.  PCCM will sign off.  Please do not hesitate to call us back if we can be of any further assistance.  Remainder of care as per primary service.    Montey Hora, [facility] Pager: [phone]  or [phone] 07/31/2016, 11:17 AM   STAFF NOTE: I, Merrie Roof, MD FACP have personally reviewed patient's available data, including medical history, events of note, physical examination and test results as part of my evaluation. I have discussed with resident/NP and other care providers such as pharmacist, RN and RRT. In addition, I personally evaluated patient and elicited key findings of: awake responsive, on TC, no PMV, secretions are lower, I would keep current trach and drop cuff, assess PMV with speech, Na correction per primary, maintain TC, d/w speech  Lavon Paganini. Titus Mould, MD, [place] Pgr: [facility] 07/31/2016 3:26 PM

## 2016-07-31 NOTE — Progress Notes (Signed)
Subjective:  Patient continues to be arousable   Able to follow simple commands at present Hemodialysis to be performed again tomorrow.   Objective:  Vital signs in last 24 hours:  Temperature 97.7 pulse 93. Respirations 17 and blood pressure 136/79  Physical Exam: General: Chronically ill appearing, NAD  HEENT Anicteric, moist oral mucus membranes  Neck Trach in place  Pulm/lungs Vent dependent, bilateral rhonchi, normal effort  CVS/Heart Regular, no rub  Abdomen:  PEG tube, soft NTND, BS present  Extremities: No edema, feet in soft supports  Neurologic: Arousable, following simple commands  Skin: No acute rashes  Access: Rt IJ PC       Basic Metabolic Panel:   Recent Labs Lab 07/25/16 0900 07/29/16 0500  NA 129* 130*  K 4.8 3.5  CL 94* 95*  CO2 24 24  GLUCOSE 174* 135*  BUN 44* 14  CREATININE 4.65* 2.96*  CALCIUM 8.8* 8.5*  PHOS 4.1 4.0     CBC:  Recent Labs Lab 07/25/16 0900 07/27/16 0630 07/29/16 0430  WBC 15.1* 12.9* 10.1  HGB 8.5* 8.3* 7.8*  HCT 26.3* 25.8* 24.5*  MCV 88.3 89.6 89.4  PLT 302 303 311      Microbiology:  Recent Results (from the past 720 hour(s))  C difficile quick scan w PCR reflex     Status: Abnormal   Collection Time: 07/04/16 10:10 AM  Result Value Ref Range Status   C Diff antigen POSITIVE (A) NEGATIVE Final   C Diff toxin POSITIVE (A) NEGATIVE Final   C Diff interpretation Toxin producing C. difficile detected.  Final    Comment: CRITICAL RESULT CALLED TO, READ BACK BY AND VERIFIED WITH: L. Flood RN 11:45 07/04/16 (wilsonm)   Culture, blood (routine x 2)     Status: Abnormal   Collection Time: 07/14/16 12:48 PM  Result Value Ref Range Status   Specimen Description BLOOD LEFT HAND  Final   Special Requests IN PEDIATRIC BOTTLE 3CC  Final   Culture  Setup Time   Final    GRAM POSITIVE COCCI IN CLUSTERS IN PEDIATRIC BOTTLE CRITICAL RESULT CALLED TO, READ BACK BY AND VERIFIED WITH: J. ARMINGER, PHARM AT [place] ON  [id number] BY Rhea Bleacher    Culture (A)  Final    STAPHYLOCOCCUS SPECIES (COAGULASE NEGATIVE) THE SIGNIFICANCE OF ISOLATING THIS ORGANISM FROM A SINGLE SET OF BLOOD CULTURES WHEN MULTIPLE SETS ARE DRAWN IS UNCERTAIN. PLEASE NOTIFY THE MICROBIOLOGY DEPARTMENT WITHIN ONE WEEK IF SPECIATION AND SENSITIVITIES ARE REQUIRED.    Report Status 07/17/2016 FINAL  Final  Culture, blood (routine x 2)     Status: None   Collection Time: 07/14/16 12:59 PM  Result Value Ref Range Status   Specimen Description BLOOD RIGHT HAND  Final   Special Requests IN PEDIATRIC BOTTLE 3CC  Final   Culture NO GROWTH 5 DAYS  Final   Report Status 07/19/2016 FINAL  Final  Culture, respiratory (NON-Expectorated)     Status: None   Collection Time: 07/14/16  2:55 PM  Result Value Ref Range Status   Specimen Description TRACHEAL ASPIRATE  Final   Special Requests NONE  Final   Gram Stain   Final    ABUNDANT WBC PRESENT, PREDOMINANTLY PMN FEW GRAM NEGATIVE RODS RARE GRAM POSITIVE COCCI IN PAIRS    Culture FEW ESCHERICHIA COLI  Final   Report Status 07/16/2016 FINAL  Final   Organism ID, Bacteria ESCHERICHIA COLI  Final      Susceptibility   Escherichia coli - MIC*  AMPICILLIN >=32 RESISTANT Resistant     CEFAZOLIN <=4 SENSITIVE Sensitive     CEFEPIME <=1 SENSITIVE Sensitive     CEFTAZIDIME <=1 SENSITIVE Sensitive     CEFTRIAXONE <=1 SENSITIVE Sensitive     CIPROFLOXACIN 1 SENSITIVE Sensitive     GENTAMICIN <=1 SENSITIVE Sensitive     IMIPENEM <=0.25 SENSITIVE Sensitive     TRIMETH/SULFA >=320 RESISTANT Resistant     AMPICILLIN/SULBACTAM 16 INTERMEDIATE Intermediate     PIP/TAZO <=4 SENSITIVE Sensitive     Extended ESBL NEGATIVE Sensitive     * FEW ESCHERICHIA COLI  Culture, respiratory (NON-Expectorated)     Status: None   Collection Time: 07/18/16  8:04 PM  Result Value Ref Range Status   Specimen Description TRACHEAL ASPIRATE  Final   Special Requests NONE  Final   Gram Stain   Final    MODERATE WBC  PRESENT,BOTH PMN AND MONONUCLEAR NO ORGANISMS SEEN    Culture RARE ESCHERICHIA COLI  Final   Report Status 07/21/2016 FINAL  Final   Organism ID, Bacteria ESCHERICHIA COLI  Final      Susceptibility   Escherichia coli - MIC*    AMPICILLIN >=32 RESISTANT Resistant     CEFAZOLIN <=4 SENSITIVE Sensitive     CEFEPIME <=1 SENSITIVE Sensitive     CEFTAZIDIME <=1 SENSITIVE Sensitive     CEFTRIAXONE <=1 SENSITIVE Sensitive     CIPROFLOXACIN 1 SENSITIVE Sensitive     GENTAMICIN <=1 SENSITIVE Sensitive     IMIPENEM <=0.25 SENSITIVE Sensitive     TRIMETH/SULFA >=320 RESISTANT Resistant     AMPICILLIN/SULBACTAM 16 INTERMEDIATE Intermediate     PIP/TAZO <=4 SENSITIVE Sensitive     Extended ESBL NEGATIVE Sensitive     * RARE ESCHERICHIA COLI    Coagulation Studies: No results for input(s): LABPROT, INR in the last 72 hours.  Urinalysis: No results for input(s): COLORURINE, LABSPEC, PHURINE, GLUCOSEU, HGBUR, BILIRUBINUR, KETONESUR, PROTEINUR, UROBILINOGEN, NITRITE, LEUKOCYTESUR in the last 72 hours.  Invalid input(s): APPERANCEUR    Imaging: No results found.   Medications:    . 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 Acute renal failure  Likely secondary to ATN from septic shock CRRT initially started on September 30. Subsequently, Transitioned to intermittent hemodialysis. -  Patient due for hemodialysis again tomorrow.  We will prepare orders. - Currently TTS schedule  2. Acute respiratory failure - reviewed pulmonary critical care note.  They recommend continued pressure support while transitioning over  tracheostomy collar eventually.  3. Pneumonia Tracheal aspirate from October 20 is growing Escherichia coli Antibiotics as per internal medicine team Pharmacy to adjust dosing for HD  4. Malnutrition Albumin remains very low at 1.7.  Patient has severe malnutrition.  Continue nutritional support per nutrition team recommendations.  5. Anemia - hemoglobin slightly lower today at 8.3.  Patient has had recent transfusion.  Continue to monitor CBC.   LOS: 0 Briceida Rasberry 11/6/20175:08 PM

## 2016-08-01 LAB — RENAL FUNCTION PANEL
ALBUMIN: 1.9 g/dL — AB (ref 3.5–5.0)
ANION GAP: 9 (ref 5–15)
BUN: 19 mg/dL (ref 6–20)
CALCIUM: 8.7 mg/dL — AB (ref 8.9–10.3)
CO2: 26 mmol/L (ref 22–32)
Chloride: 95 mmol/L — ABNORMAL LOW (ref 101–111)
Creatinine, Ser: 2.86 mg/dL — ABNORMAL HIGH (ref 0.44–1.00)
GFR calc non Af Amer: 16 mL/min — ABNORMAL LOW (ref >60)
GFR, EST AFRICAN AMERICAN: 19 mL/min — AB (ref >60)
Glucose, Bld: 147 mg/dL — ABNORMAL HIGH (ref 65–99)
PHOSPHORUS: 4.6 mg/dL (ref 2.5–4.6)
Potassium: 3.3 mmol/L — ABNORMAL LOW (ref 3.5–5.1)
SODIUM: 130 mmol/L — AB (ref 135–145)

## 2016-08-01 LAB — CBC
HEMATOCRIT: 25.5 % — AB (ref 36.0–46.0)
HEMOGLOBIN: 8.4 g/dL — AB (ref 12.0–15.0)
MCH: 29.2 pg (ref 26.0–34.0)
MCHC: 32.9 g/dL (ref 30.0–36.0)
MCV: 88.5 fL (ref 78.0–100.0)
Platelets: 338 10*3/uL (ref 150–400)
RBC: 2.88 MIL/uL — AB (ref 3.87–5.11)
RDW: 14.3 % (ref 11.5–15.5)
WBC: 9 10*3/uL (ref 4.0–10.5)

## 2016-08-01 LAB — BLOOD GAS, ARTERIAL
Acid-Base Excess: 1.5 mmol/L (ref 0.0–2.0)
Bicarbonate: 26 mmol/L (ref 20.0–28.0)
FIO2: 28
O2 SAT: 98.6 %
PATIENT TEMPERATURE: 98.6
PCO2 ART: 43.8 mmHg (ref 32.0–48.0)
pH, Arterial: 7.391 (ref 7.350–7.450)
pO2, Arterial: 122 mmHg — ABNORMAL HIGH (ref 83.0–108.0)

## 2016-08-02 NOTE — Progress Notes (Signed)
Subjective:  Patient is able to follow simple commands at present Hemodialysis to be performed again tomorrow.   Objective:  Vital signs in last 24 hours:  Temperature 96.7 pulse 89. Respirations 15 and blood pressure 128/65  Physical Exam: General: Chronically ill appearing, NAD  HEENT Anicteric, moist oral mucus membranes  Neck Trach in place  Pulm/lungs   normal effort  CVS/Heart Regular, no rub  Abdomen:  PEG tube, soft NT ND, BS present  Extremities: No edema, feet in soft supports  Neurologic: Arousable, following simple commands  Skin: No acute rashes  Access: Rt IJ PC       Basic Metabolic Panel:   Recent Labs Lab 07/29/16 0500 08/01/16 0649  NA 130* 130*  K 3.5 3.3*  CL 95* 95*  CO2 24 26  GLUCOSE 135* 147*  BUN 14 19  CREATININE 2.96* 2.86*  CALCIUM 8.5* 8.7*  PHOS 4.0 4.6     CBC:  Recent Labs Lab 07/27/16 0630 07/29/16 0430 08/01/16 0649  WBC 12.9* 10.1 9.0  HGB 8.3* 7.8* 8.4*  HCT 25.8* 24.5* 25.5*  MCV 89.6 89.4 88.5  PLT 303 311 338      Microbiology:  Recent Results (from the past 720 hour(s))  C difficile quick scan w PCR reflex     Status: Abnormal   Collection Time: 07/04/16 10:10 AM  Result Value Ref Range Status   C Diff antigen POSITIVE (A) NEGATIVE Final   C Diff toxin POSITIVE (A) NEGATIVE Final   C Diff interpretation Toxin producing C. difficile detected.  Final    Comment: CRITICAL RESULT CALLED TO, READ BACK BY AND VERIFIED WITH: L. Flood RN 11:45 07/04/16 (wilsonm)   Culture, blood (routine x 2)     Status: Abnormal   Collection Time: 07/14/16 12:48 PM  Result Value Ref Range Status   Specimen Description BLOOD LEFT HAND  Final   Special Requests IN PEDIATRIC BOTTLE 3CC  Final   Culture  Setup Time   Final    GRAM POSITIVE COCCI IN CLUSTERS IN PEDIATRIC BOTTLE CRITICAL RESULT CALLED TO, READ BACK BY AND VERIFIED WITH: J. ARMINGER, PHARM AT [place] ON [id number] BY Rhea Bleacher    Culture (A)  Final   STAPHYLOCOCCUS SPECIES (COAGULASE NEGATIVE) THE SIGNIFICANCE OF ISOLATING THIS ORGANISM FROM A SINGLE SET OF BLOOD CULTURES WHEN MULTIPLE SETS ARE DRAWN IS UNCERTAIN. PLEASE NOTIFY THE MICROBIOLOGY DEPARTMENT WITHIN ONE WEEK IF SPECIATION AND SENSITIVITIES ARE REQUIRED.    Report Status 07/17/2016 FINAL  Final  Culture, blood (routine x 2)     Status: None   Collection Time: 07/14/16 12:59 PM  Result Value Ref Range Status   Specimen Description BLOOD RIGHT HAND  Final   Special Requests IN PEDIATRIC BOTTLE 3CC  Final   Culture NO GROWTH 5 DAYS  Final   Report Status 07/19/2016 FINAL  Final  Culture, respiratory (NON-Expectorated)     Status: None   Collection Time: 07/14/16  2:55 PM  Result Value Ref Range Status   Specimen Description TRACHEAL ASPIRATE  Final   Special Requests NONE  Final   Gram Stain   Final    ABUNDANT WBC PRESENT, PREDOMINANTLY PMN FEW GRAM NEGATIVE RODS RARE GRAM POSITIVE COCCI IN PAIRS    Culture FEW ESCHERICHIA COLI  Final   Report Status 07/16/2016 FINAL  Final   Organism ID, Bacteria ESCHERICHIA COLI  Final      Susceptibility   Escherichia coli - MIC*    AMPICILLIN >=32 RESISTANT Resistant  CEFAZOLIN <=4 SENSITIVE Sensitive     CEFEPIME <=1 SENSITIVE Sensitive     CEFTAZIDIME <=1 SENSITIVE Sensitive     CEFTRIAXONE <=1 SENSITIVE Sensitive     CIPROFLOXACIN 1 SENSITIVE Sensitive     GENTAMICIN <=1 SENSITIVE Sensitive     IMIPENEM <=0.25 SENSITIVE Sensitive     TRIMETH/SULFA >=320 RESISTANT Resistant     AMPICILLIN/SULBACTAM 16 INTERMEDIATE Intermediate     PIP/TAZO <=4 SENSITIVE Sensitive     Extended ESBL NEGATIVE Sensitive     * FEW ESCHERICHIA COLI  Culture, respiratory (NON-Expectorated)     Status: None   Collection Time: 07/18/16  8:04 PM  Result Value Ref Range Status   Specimen Description TRACHEAL ASPIRATE  Final   Special Requests NONE  Final   Gram Stain   Final    MODERATE WBC PRESENT,BOTH PMN AND MONONUCLEAR NO ORGANISMS SEEN     Culture RARE ESCHERICHIA COLI  Final   Report Status 07/21/2016 FINAL  Final   Organism ID, Bacteria ESCHERICHIA COLI  Final      Susceptibility   Escherichia coli - MIC*    AMPICILLIN >=32 RESISTANT Resistant     CEFAZOLIN <=4 SENSITIVE Sensitive     CEFEPIME <=1 SENSITIVE Sensitive     CEFTAZIDIME <=1 SENSITIVE Sensitive     CEFTRIAXONE <=1 SENSITIVE Sensitive     CIPROFLOXACIN 1 SENSITIVE Sensitive     GENTAMICIN <=1 SENSITIVE Sensitive     IMIPENEM <=0.25 SENSITIVE Sensitive     TRIMETH/SULFA >=320 RESISTANT Resistant     AMPICILLIN/SULBACTAM 16 INTERMEDIATE Intermediate     PIP/TAZO <=4 SENSITIVE Sensitive     Extended ESBL NEGATIVE Sensitive     * RARE ESCHERICHIA COLI    Coagulation Studies: No results for input(s): LABPROT, INR in the last 72 hours.  Urinalysis: No results for input(s): COLORURINE, LABSPEC, PHURINE, GLUCOSEU, HGBUR, BILIRUBINUR, KETONESUR, PROTEINUR, UROBILINOGEN, NITRITE, LEUKOCYTESUR in the last 72 hours.  Invalid input(s): APPERANCEUR    Imaging: No results found.   Medications:    . 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 Acute renal failure  Likely secondary to ATN from septic shock CRRT initially started on September 30. Subsequently, Transitioned to intermittent hemodialysis. -  Patient due for hemodialysis again tomorrow.  We will prepare orders. - Currently TTS schedule but change to MWF starting Friday  2. Acute respiratory failure -  Slow progress  3. Pneumonia Tracheal aspirate from October 20 is growing Escherichia coli Antibiotics as per internal  medicine team Pharmacy to adjust dosing for HD  4. Malnutrition Albumin remains very low at 1.9.  Patient has severe malnutrition.  Continue nutritional support per nutrition team recommendations.  5. Anemia of CKD - hemoglobin slightly lower today at 8.4.  Patient has had recent transfusion.  Continue to monitor CBC. aranesp 60 weekly  6 .Hypokalemia - replace PRN   LOS: 0 Jairon Ripberger 11/8/20173:50 PM

## 2016-08-03 ENCOUNTER — Institutional Professional Consult (permissible substitution) (HOSPITAL_COMMUNITY): Payer: Self-pay

## 2016-08-03 DIAGNOSIS — K5669 Other partial intestinal obstruction: Secondary | ICD-10-CM | POA: Diagnosis not present

## 2016-08-03 LAB — CBC
HEMATOCRIT: 26.1 % — AB (ref 36.0–46.0)
HEMOGLOBIN: 8.3 g/dL — AB (ref 12.0–15.0)
MCH: 28.4 pg (ref 26.0–34.0)
MCHC: 31.8 g/dL (ref 30.0–36.0)
MCV: 89.4 fL (ref 78.0–100.0)
Platelets: 307 10*3/uL (ref 150–400)
RBC: 2.92 MIL/uL — ABNORMAL LOW (ref 3.87–5.11)
RDW: 14.5 % (ref 11.5–15.5)
WBC: 7.4 10*3/uL (ref 4.0–10.5)

## 2016-08-03 LAB — RENAL FUNCTION PANEL
ANION GAP: 9 (ref 5–15)
Albumin: 1.7 g/dL — ABNORMAL LOW (ref 3.5–5.0)
BUN: 15 mg/dL (ref 6–20)
CHLORIDE: 98 mmol/L — AB (ref 101–111)
CO2: 25 mmol/L (ref 22–32)
Calcium: 8.9 mg/dL (ref 8.9–10.3)
Creatinine, Ser: 2.09 mg/dL — ABNORMAL HIGH (ref 0.44–1.00)
GFR calc Af Amer: 28 mL/min — ABNORMAL LOW (ref >60)
GFR, EST NON AFRICAN AMERICAN: 24 mL/min — AB (ref >60)
Glucose, Bld: 141 mg/dL — ABNORMAL HIGH (ref 65–99)
POTASSIUM: 3 mmol/L — AB (ref 3.5–5.1)
Phosphorus: 3.6 mg/dL (ref 2.5–4.6)
Sodium: 132 mmol/L — ABNORMAL LOW (ref 135–145)

## 2016-08-03 LAB — POTASSIUM: POTASSIUM: 3 mmol/L — AB (ref 3.5–5.1)

## 2016-08-04 LAB — HEMOGLOBIN AND HEMATOCRIT, BLOOD
HCT: 25.7 % — ABNORMAL LOW (ref 36.0–46.0)
Hemoglobin: 8.2 g/dL — ABNORMAL LOW (ref 12.0–15.0)

## 2016-08-04 LAB — BASIC METABOLIC PANEL
Anion gap: 6 (ref 5–15)
BUN: 6 mg/dL (ref 6–20)
CHLORIDE: 103 mmol/L (ref 101–111)
CO2: 27 mmol/L (ref 22–32)
Calcium: 8.5 mg/dL — ABNORMAL LOW (ref 8.9–10.3)
Creatinine, Ser: 1.33 mg/dL — ABNORMAL HIGH (ref 0.44–1.00)
GFR calc non Af Amer: 42 mL/min — ABNORMAL LOW (ref >60)
GFR, EST AFRICAN AMERICAN: 48 mL/min — AB (ref >60)
Glucose, Bld: 179 mg/dL — ABNORMAL HIGH (ref 65–99)
POTASSIUM: 3.5 mmol/L (ref 3.5–5.1)
SODIUM: 136 mmol/L (ref 135–145)

## 2016-08-04 NOTE — Progress Notes (Signed)
Subjective:  Patient is able to follow simple commands at present She now has PM valve. She is able to speak a little She underwent dialysis earlier today. BFR 350. DFR 800 Net UF 350 cc   Objective:  Vital signs in last 24 hours:  Temperature 97.8, respirations 18, blood pressure 111/44, pulse 94  Physical Exam: General: Chronically ill appearing, NAD  HEENT Anicteric, moist oral mucus membranes  Neck Trach in place, PM valve  Pulm/lungs   normal effort  CVS/Heart Regular, no rub  Abdomen:  PEG tube, soft NT ND, BS present  Extremities: No edema, feet in soft supports  Neurologic: Arousable, following simple commands  Skin: No acute rashes  Access: Rt IJ PC       Basic Metabolic Panel:   Recent Labs Lab 07/29/16 0500 08/01/16 0649 08/03/16 0509 08/04/16 0613  NA 130* 130* 132* 136  K 3.5 3.3* 3.0*  3.0* 3.5  CL 95* 95* 98* 103  CO2 '24 26 25 27  '$ GLUCOSE 135* 147* 141* 179*  BUN '14 19 15 6  '$ CREATININE 2.96* 2.86* 2.09* 1.33*  CALCIUM 8.5* 8.7* 8.9 8.5*  PHOS 4.0 4.6 3.6  --      CBC:  Recent Labs Lab 07/29/16 0430 08/01/16 0649 08/03/16 0509 08/04/16 0613  WBC 10.1 9.0 7.4  --   HGB 7.8* 8.4* 8.3* 8.2*  HCT 24.5* 25.5* 26.1* 25.7*  MCV 89.4 88.5 89.4  --   PLT 311 338 307  --       Microbiology:  Recent Results (from the past 720 hour(s))  Culture, blood (routine x 2)     Status: Abnormal   Collection Time: 07/14/16 12:48 PM  Result Value Ref Range Status   Specimen Description BLOOD LEFT HAND  Final   Special Requests IN PEDIATRIC BOTTLE 3CC  Final   Culture  Setup Time   Final    GRAM POSITIVE COCCI IN CLUSTERS IN PEDIATRIC BOTTLE CRITICAL RESULT CALLED TO, READ BACK BY AND VERIFIED WITH: J. ARMINGER, PHARM AT [place] ON [id number] BY Rhea Bleacher    Culture (A)  Final    STAPHYLOCOCCUS SPECIES (COAGULASE NEGATIVE) THE SIGNIFICANCE OF ISOLATING THIS ORGANISM FROM A SINGLE SET OF BLOOD CULTURES WHEN MULTIPLE SETS ARE DRAWN IS UNCERTAIN. PLEASE  NOTIFY THE MICROBIOLOGY DEPARTMENT WITHIN ONE WEEK IF SPECIATION AND SENSITIVITIES ARE REQUIRED.    Report Status 07/17/2016 FINAL  Final  Culture, blood (routine x 2)     Status: None   Collection Time: 07/14/16 12:59 PM  Result Value Ref Range Status   Specimen Description BLOOD RIGHT HAND  Final   Special Requests IN PEDIATRIC BOTTLE 3CC  Final   Culture NO GROWTH 5 DAYS  Final   Report Status 07/19/2016 FINAL  Final  Culture, respiratory (NON-Expectorated)     Status: None   Collection Time: 07/14/16  2:55 PM  Result Value Ref Range Status   Specimen Description TRACHEAL ASPIRATE  Final   Special Requests NONE  Final   Gram Stain   Final    ABUNDANT WBC PRESENT, PREDOMINANTLY PMN FEW GRAM NEGATIVE RODS RARE GRAM POSITIVE COCCI IN PAIRS    Culture FEW ESCHERICHIA COLI  Final   Report Status 07/16/2016 FINAL  Final   Organism ID, Bacteria ESCHERICHIA COLI  Final      Susceptibility   Escherichia coli - MIC*    AMPICILLIN >=32 RESISTANT Resistant     CEFAZOLIN <=4 SENSITIVE Sensitive     CEFEPIME <=1 SENSITIVE Sensitive  CEFTAZIDIME <=1 SENSITIVE Sensitive     CEFTRIAXONE <=1 SENSITIVE Sensitive     CIPROFLOXACIN 1 SENSITIVE Sensitive     GENTAMICIN <=1 SENSITIVE Sensitive     IMIPENEM <=0.25 SENSITIVE Sensitive     TRIMETH/SULFA >=320 RESISTANT Resistant     AMPICILLIN/SULBACTAM 16 INTERMEDIATE Intermediate     PIP/TAZO <=4 SENSITIVE Sensitive     Extended ESBL NEGATIVE Sensitive     * FEW ESCHERICHIA COLI  Culture, respiratory (NON-Expectorated)     Status: None   Collection Time: 07/18/16  8:04 PM  Result Value Ref Range Status   Specimen Description TRACHEAL ASPIRATE  Final   Special Requests NONE  Final   Gram Stain   Final    MODERATE WBC PRESENT,BOTH PMN AND MONONUCLEAR NO ORGANISMS SEEN    Culture RARE ESCHERICHIA COLI  Final   Report Status 07/21/2016 FINAL  Final   Organism ID, Bacteria ESCHERICHIA COLI  Final      Susceptibility   Escherichia coli -  MIC*    AMPICILLIN >=32 RESISTANT Resistant     CEFAZOLIN <=4 SENSITIVE Sensitive     CEFEPIME <=1 SENSITIVE Sensitive     CEFTAZIDIME <=1 SENSITIVE Sensitive     CEFTRIAXONE <=1 SENSITIVE Sensitive     CIPROFLOXACIN 1 SENSITIVE Sensitive     GENTAMICIN <=1 SENSITIVE Sensitive     IMIPENEM <=0.25 SENSITIVE Sensitive     TRIMETH/SULFA >=320 RESISTANT Resistant     AMPICILLIN/SULBACTAM 16 INTERMEDIATE Intermediate     PIP/TAZO <=4 SENSITIVE Sensitive     Extended ESBL NEGATIVE Sensitive     * RARE ESCHERICHIA COLI    Coagulation Studies: No results for input(s): LABPROT, INR in the last 72 hours.  Urinalysis: No results for input(s): COLORURINE, LABSPEC, PHURINE, GLUCOSEU, HGBUR, BILIRUBINUR, KETONESUR, PROTEINUR, UROBILINOGEN, NITRITE, LEUKOCYTESUR in the last 72 hours.  Invalid input(s): APPERANCEUR    Imaging: Dg Abd Portable 1v  Result Date: 08/03/2016 CLINICAL DATA:  65 year old female with history of bowel obstruction. EXAM: PORTABLE ABDOMEN - 1 VIEW COMPARISON:  Abdominal radiograph 07/26/2016. FINDINGS: Percutaneous gastrostomy tube projecting over the epigastric region again noted. There multiple gas-filled loops of small bowel which are borderline to mildly dilated, measuring up to 4.4 cm in the central abdomen. The overall severity of small bowel dilatation appears slightly improved compared to the prior examination. Paucity of colonic gas. Large calcification in the low right anatomic pelvis, presumably a calcified fibroid. No gross evidence of pneumoperitoneum on this single supine view of the abdomen. IMPRESSION: 1. Findings remain concerning for probable partial small bowel obstruction. Electronically Signed   By: Vinnie Langton M.D.   On: 08/03/2016 08:33     Medications:    . diatrizoate meglumine-sodium  30 mL Oral Once     Assessment/ Plan:  65 y.o.African American  female with history of stroke, left-sided weakness, CT with chronic small vessel ischemic  changes, chronic lacunar infarct left basal ganglia, chronic ischemia the right side of pons, diastolic dysfunction with normal EF of 60-65%, diabetes, chronic kidney disease, chronic urinary tract infections due to incontinence  1. Acute renal failure  Likely secondary to ATN from septic shock CRRT initially started on September 30. Subsequently, Transitioned to intermittent hemodialysis.  -  MWF schedule - Nearing ESRD  2. Acute respiratory failure -  Slow progress - PM valve  3. Pneumonia Tracheal aspirate from October 20 is growing Escherichia coli Antibiotics as per internal medicine team Pharmacy to adjust dosing for HD  4. Malnutrition Albumin remains very  low at 1.7.  Patient has severe malnutrition.  Continue nutritional support per nutrition team recommendations.  5. Anemia of CKD - hemoglobin slightly lower today at 8.2.  Patient has had recent transfusion.  Continue to monitor CBC. aranesp 60 weekly  6 .Hypokalemia - replace PRN   LOS: 0 Angelica Beck 11/10/20176:13 PM

## 2016-08-05 LAB — TSH: TSH: 0.641 u[IU]/mL (ref 0.350–4.500)

## 2016-08-05 LAB — T4, FREE: Free T4: 1.74 ng/dL — ABNORMAL HIGH (ref 0.61–1.12)

## 2016-08-06 ENCOUNTER — Other Ambulatory Visit (HOSPITAL_COMMUNITY): Payer: Self-pay

## 2016-08-06 DIAGNOSIS — R0602 Shortness of breath: Secondary | ICD-10-CM | POA: Diagnosis not present

## 2016-08-06 DIAGNOSIS — R109 Unspecified abdominal pain: Secondary | ICD-10-CM | POA: Diagnosis not present

## 2016-08-06 LAB — BASIC METABOLIC PANEL
ANION GAP: 6 (ref 5–15)
BUN: 11 mg/dL (ref 6–20)
CHLORIDE: 95 mmol/L — AB (ref 101–111)
CO2: 33 mmol/L — AB (ref 22–32)
Calcium: 9.4 mg/dL (ref 8.9–10.3)
Creatinine, Ser: 1.65 mg/dL — ABNORMAL HIGH (ref 0.44–1.00)
GFR calc non Af Amer: 32 mL/min — ABNORMAL LOW (ref >60)
GFR, EST AFRICAN AMERICAN: 37 mL/min — AB (ref >60)
Glucose, Bld: 131 mg/dL — ABNORMAL HIGH (ref 65–99)
Potassium: 4.2 mmol/L (ref 3.5–5.1)
Sodium: 134 mmol/L — ABNORMAL LOW (ref 135–145)

## 2016-08-06 LAB — CBC WITH DIFFERENTIAL/PLATELET
BASOS ABS: 0 10*3/uL (ref 0.0–0.1)
BASOS PCT: 0 %
Eosinophils Absolute: 0.2 10*3/uL (ref 0.0–0.7)
Eosinophils Relative: 3 %
HEMATOCRIT: 31 % — AB (ref 36.0–46.0)
HEMOGLOBIN: 9.6 g/dL — AB (ref 12.0–15.0)
Lymphocytes Relative: 26 %
Lymphs Abs: 2.2 10*3/uL (ref 0.7–4.0)
MCH: 28.3 pg (ref 26.0–34.0)
MCHC: 31 g/dL (ref 30.0–36.0)
MCV: 91.4 fL (ref 78.0–100.0)
MONOS PCT: 13 %
Monocytes Absolute: 1.1 10*3/uL — ABNORMAL HIGH (ref 0.1–1.0)
NEUTROS ABS: 5 10*3/uL (ref 1.7–7.7)
NEUTROS PCT: 58 %
Platelets: 253 10*3/uL (ref 150–400)
RBC: 3.39 MIL/uL — ABNORMAL LOW (ref 3.87–5.11)
RDW: 14.8 % (ref 11.5–15.5)
WBC: 8.6 10*3/uL (ref 4.0–10.5)

## 2016-08-06 LAB — MAGNESIUM: Magnesium: 1.6 mg/dL — ABNORMAL LOW (ref 1.7–2.4)

## 2016-08-07 LAB — RENAL FUNCTION PANEL
Albumin: 1.4 g/dL — ABNORMAL LOW (ref 3.5–5.0)
Anion gap: 9 (ref 5–15)
BUN: 14 mg/dL (ref 6–20)
CHLORIDE: 101 mmol/L (ref 101–111)
CO2: 29 mmol/L (ref 22–32)
Calcium: 8 mg/dL — ABNORMAL LOW (ref 8.9–10.3)
Creatinine, Ser: 1.62 mg/dL — ABNORMAL HIGH (ref 0.44–1.00)
GFR calc Af Amer: 38 mL/min — ABNORMAL LOW (ref >60)
GFR, EST NON AFRICAN AMERICAN: 33 mL/min — AB (ref >60)
Glucose, Bld: 140 mg/dL — ABNORMAL HIGH (ref 65–99)
POTASSIUM: 3.5 mmol/L (ref 3.5–5.1)
Phosphorus: 2.7 mg/dL (ref 2.5–4.6)
Sodium: 139 mmol/L (ref 135–145)

## 2016-08-07 LAB — CBC
HEMATOCRIT: 22.9 % — AB (ref 36.0–46.0)
Hemoglobin: 7.3 g/dL — ABNORMAL LOW (ref 12.0–15.0)
MCH: 28.6 pg (ref 26.0–34.0)
MCHC: 31.9 g/dL (ref 30.0–36.0)
MCV: 89.8 fL (ref 78.0–100.0)
PLATELETS: 230 10*3/uL (ref 150–400)
RBC: 2.55 MIL/uL — ABNORMAL LOW (ref 3.87–5.11)
RDW: 14.4 % (ref 11.5–15.5)
WBC: 10.5 10*3/uL (ref 4.0–10.5)

## 2016-08-07 NOTE — Progress Notes (Signed)
Subjective:  Patient completed hemodialysis today. She is resting in bed at the moment comfortably.   Objective:  Vital signs in last 24 hours:  Temperature 90.9 pulse 97 respirations 18 blood pressure 146/70  Physical Exam: General: Chronically ill appearing, NAD  HEENT Anicteric, moist oral mucus membranes  Neck Trach in place  Pulm/lungs  normal effort, scattered rhonchi  CVS/Heart Regular, no rub  Abdomen:  PEG tube, soft NT ND, BS present  Extremities: No edema, feet in soft supports  Neurologic: Arousable, following simple commands  Skin: No acute rashes  Access: Rt IJ PC       Basic Metabolic Panel:   Recent Labs Lab 08/01/16 0649 08/03/16 0509 08/04/16 0613 08/06/16 0612 08/07/16 0756  NA 130* 132* 136 134* 139  K 3.3* 3.0*  3.0* 3.5 4.2 3.5  CL 95* 98* 103 95* 101  CO2 '26 25 27 '$ 33* 29  GLUCOSE 147* 141* 179* 131* 140*  BUN '19 15 6 11 14  '$ CREATININE 2.86* 2.09* 1.33* 1.65* 1.62*  CALCIUM 8.7* 8.9 8.5* 9.4 8.0*  MG  --   --   --  1.6*  --   PHOS 4.6 3.6  --   --  2.7     CBC:  Recent Labs Lab 08/01/16 0649 08/03/16 0509 08/04/16 0613 08/06/16 0612 08/07/16 0755  WBC 9.0 7.4  --  8.6 10.5  NEUTROABS  --   --   --  5.0  --   HGB 8.4* 8.3* 8.2* 9.6* 7.3*  HCT 25.5* 26.1* 25.7* 31.0* 22.9*  MCV 88.5 89.4  --  91.4 89.8  PLT 338 307  --  253 230      Microbiology:  Recent Results (from the past 720 hour(s))  Culture, blood (routine x 2)     Status: Abnormal   Collection Time: 07/14/16 12:48 PM  Result Value Ref Range Status   Specimen Description BLOOD LEFT HAND  Final   Special Requests IN PEDIATRIC BOTTLE 3CC  Final   Culture  Setup Time   Final    GRAM POSITIVE COCCI IN CLUSTERS IN PEDIATRIC BOTTLE CRITICAL RESULT CALLED TO, READ BACK BY AND VERIFIED WITH: J. ARMINGER, PHARM AT [place] ON [id number] BY Rhea Bleacher    Culture (A)  Final    STAPHYLOCOCCUS SPECIES (COAGULASE NEGATIVE) THE SIGNIFICANCE OF ISOLATING THIS ORGANISM FROM A  SINGLE SET OF BLOOD CULTURES WHEN MULTIPLE SETS ARE DRAWN IS UNCERTAIN. PLEASE NOTIFY THE MICROBIOLOGY DEPARTMENT WITHIN ONE WEEK IF SPECIATION AND SENSITIVITIES ARE REQUIRED.    Report Status 07/17/2016 FINAL  Final  Culture, blood (routine x 2)     Status: None   Collection Time: 07/14/16 12:59 PM  Result Value Ref Range Status   Specimen Description BLOOD RIGHT HAND  Final   Special Requests IN PEDIATRIC BOTTLE 3CC  Final   Culture NO GROWTH 5 DAYS  Final   Report Status 07/19/2016 FINAL  Final  Culture, respiratory (NON-Expectorated)     Status: None   Collection Time: 07/14/16  2:55 PM  Result Value Ref Range Status   Specimen Description TRACHEAL ASPIRATE  Final   Special Requests NONE  Final   Gram Stain   Final    ABUNDANT WBC PRESENT, PREDOMINANTLY PMN FEW GRAM NEGATIVE RODS RARE GRAM POSITIVE COCCI IN PAIRS    Culture FEW ESCHERICHIA COLI  Final   Report Status 07/16/2016 FINAL  Final   Organism ID, Bacteria ESCHERICHIA COLI  Final      Susceptibility   Escherichia  coli - MIC*    AMPICILLIN >=32 RESISTANT Resistant     CEFAZOLIN <=4 SENSITIVE Sensitive     CEFEPIME <=1 SENSITIVE Sensitive     CEFTAZIDIME <=1 SENSITIVE Sensitive     CEFTRIAXONE <=1 SENSITIVE Sensitive     CIPROFLOXACIN 1 SENSITIVE Sensitive     GENTAMICIN <=1 SENSITIVE Sensitive     IMIPENEM <=0.25 SENSITIVE Sensitive     TRIMETH/SULFA >=320 RESISTANT Resistant     AMPICILLIN/SULBACTAM 16 INTERMEDIATE Intermediate     PIP/TAZO <=4 SENSITIVE Sensitive     Extended ESBL NEGATIVE Sensitive     * FEW ESCHERICHIA COLI  Culture, respiratory (NON-Expectorated)     Status: None   Collection Time: 07/18/16  8:04 PM  Result Value Ref Range Status   Specimen Description TRACHEAL ASPIRATE  Final   Special Requests NONE  Final   Gram Stain   Final    MODERATE WBC PRESENT,BOTH PMN AND MONONUCLEAR NO ORGANISMS SEEN    Culture RARE ESCHERICHIA COLI  Final   Report Status 07/21/2016 FINAL  Final   Organism  ID, Bacteria ESCHERICHIA COLI  Final      Susceptibility   Escherichia coli - MIC*    AMPICILLIN >=32 RESISTANT Resistant     CEFAZOLIN <=4 SENSITIVE Sensitive     CEFEPIME <=1 SENSITIVE Sensitive     CEFTAZIDIME <=1 SENSITIVE Sensitive     CEFTRIAXONE <=1 SENSITIVE Sensitive     CIPROFLOXACIN 1 SENSITIVE Sensitive     GENTAMICIN <=1 SENSITIVE Sensitive     IMIPENEM <=0.25 SENSITIVE Sensitive     TRIMETH/SULFA >=320 RESISTANT Resistant     AMPICILLIN/SULBACTAM 16 INTERMEDIATE Intermediate     PIP/TAZO <=4 SENSITIVE Sensitive     Extended ESBL NEGATIVE Sensitive     * RARE ESCHERICHIA COLI    Coagulation Studies: No results for input(s): LABPROT, INR in the last 72 hours.  Urinalysis: No results for input(s): COLORURINE, LABSPEC, PHURINE, GLUCOSEU, HGBUR, BILIRUBINUR, KETONESUR, PROTEINUR, UROBILINOGEN, NITRITE, LEUKOCYTESUR in the last 72 hours.  Invalid input(s): APPERANCEUR    Imaging: Dg Chest Port 1 View  Result Date: 08/06/2016 CLINICAL DATA:  Respiratory failure and shortness of breath. EXAM: PORTABLE CHEST 1 VIEW COMPARISON:  07/24/2016 FINDINGS: Dialysis catheter and tracheostomy shows stable positioning. Left subclavian central line is been removed. Lungs show decreased volume on the right with more prominent right basilar atelectasis. Relatively stable left basilar atelectasis. No overt edema or pneumothorax. IMPRESSION: Increase in right basilar atelectasis since the prior study. Stable left basilar atelectasis. Electronically Signed   By: Aletta Edouard M.D.   On: 08/06/2016 09:04   Dg Abd Portable 1v  Result Date: 08/06/2016 CLINICAL DATA:  Patient with shortness of breath and abdominal pain. EXAM: PORTABLE ABDOMEN - 1 VIEW COMPARISON:  Abdominal radiograph 08/03/2016. FINDINGS: PEG tube projects over the upper abdomen. Monitoring leads overlie the patient. Interval worsening of gaseous distended loops of small bowel throughout the central abdomen. Supine evaluation  limited for the detection of free intraperitoneal air. Probable calcified fibroid within the pelvis. Paucity of distal colonic gas. Lumbar and thoracic spine degenerative changes. IMPRESSION: Interval worsening gaseous distended loops of small bowel throughout the central abdomen concerning for small bowel obstruction. Electronically Signed   By: Lovey Newcomer M.D.   On: 08/06/2016 08:59     Medications:    . 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 Acute renal failure  Likely secondary to ATN from septic shock CRRT initially started on September 30. Subsequently, Transitioned to intermittent hemodialysis.  -  Creatinine appears to be trending down. However does unclear as to how much urine output the patient has. Hold dialysis on Wednesday.  2. Acute respiratory failure -  Trach in place and appears to be functioning well. It is currently not.  3. Pneumonia Tracheal aspirate from October 20 is growing Escherichia coli Antibiotics as per internal medicine team Pharmacy to adjust dosing for HD  4. Malnutrition Nutritional status remains quite poor. Albumin down to 1.4 which in part could be delusional after the weekend.  5. Anemia of CKD - most recent hemoglobin was 7.3. Consider blood transfusion for hemoglobin of 7 or less.  6 .Hypokalemia - potassium now normalized at 3.5. Was lower last week. Continue to monitor serum potassium periodically.   LOS: 0 Angelica Beck 11/13/20173:59 PM

## 2016-08-08 DIAGNOSIS — G9340 Encephalopathy, unspecified: Secondary | ICD-10-CM

## 2016-08-08 DIAGNOSIS — Z930 Tracheostomy status: Secondary | ICD-10-CM

## 2016-08-08 LAB — BRAIN NATRIURETIC PEPTIDE: B Natriuretic Peptide: 67.5 pg/mL (ref 0.0–100.0)

## 2016-08-08 NOTE — Progress Notes (Signed)
Name: Angelica Beck MRN: [id number] DOB: [birth year]    ADMISSION DATE:  07/18/2016 CONSULTATION DATE:  10/25  REFERRING MD :  Hijazi   CHIEF COMPLAINT:  Vent management   BRIEF PATIENT DESCRIPTION: 65 y.o. 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 Initially admitted 9/23 with septic shock secondary to UTI.  She required CVVHD in the setting of acute on chronic renal failure.  Last HD completed 10/21.  Mental status improved with CVVHD.  Initial urine culture was positive for klebsiella and e-coli.  She was treated with 7 days of merrem.  She was extubated 10/14 but requiring intermittent noninvasive positive pressure ventilation. She failed extubation and was re-intubated 10/16.  Course complicated by SBO (now resolved). She was found to be positive for C diff & treated with PO vancomycin. Due to poor weaning, she had a tracheostomy placed by ENT 10/20 & G-tube placement by IR on 10/23. Sputum culture grew e-coli and she was treated with Cipro.  The patient was medically cleared for discharge to [facility] on 10/24  STUDIES: CT Head 9/27 >> No acute abnormality. Chronic small vessel ischemic changes. Chronic lacunar infarct left basal ganglia &chronic ischemia right side of pons.  CT Abd/Pelvis w/o 9/27 >>Early partial SBO w/ transition point LLQ with adhesions. Left humerus soft tissue swelling &intramuscular gas. Punctate hepatic granulomas noted.  Left Arm X-ray 9/27 >>Diffuse soft tissue edema.  TTE 9/27 >>LV w/ EF 60-65%. Grade 1 Diastolic dysfunction. No regional wall motion abnormalities. LA &RA normal in size. RV normal in size &function. Mild AS w/o AR. Mild MR w/o MS.  ABIs 10/3 >>R normal. L posterior artery noncompressible; suspect calcification.  CT head 10/3 >>No acute infarct, no hemorrhage or mass.  Atrophy evident. Similar to prior study.   MICROBIOLOGY: MRSA PCR 9/27 >> Negative Blood Ctx x 2 9/26 >> Coag Neg Staph 1/2 Urine Ctx 9/26 >>5000 CFU E coli &1000 CFU Klebsiella  C diff PCR 10/10 >> Toxin &Antigen Positive Sputum 10/20 >> e-coli, sens CIPRO BC 10/20 >> E.coli  ANTIBIOTICS: Levaquin 9/26 - 9/28 Aztreonam 9/26 - 9/27 Vancomycin 9/26 - off Merrem 9/27 - 10/3 PO Vancomycin 10/10 >> Flagyl IV 10/15 (while NGT is out) >> 10/16 Cipro 10/22 >> off  SIGNIFICANT EVENTS: 09/26 - Admit for presumed septic shock 10/14 - extubated 10/16 - reintubated 10/18 - HD perm cath placed 10/20 - Trach per Dr. Redmond Baseman 10/23 - PEG per IR  11/5 > ATC x 24 hours, tolerated well.  LINES/TUBES: OETT 7.5 9/26 - 10/14, OETT 10/16 >>10/20, TRACH 10/20 (ENT Dr Redmond Baseman)  OGT 9/26 - 10/14 OGT 10/16 >> L IJ CVL 9/26 >> Foley 9/26 >> 10/8 RIJ HD 9/30 >> 10/18 Rectal pouch 10/17 > 10/18 NG tube 10/17 >> R HD Perm Cath 10/18 >>  SUBJECTIVE:   ATC 28% 24 hours now  VITAL SIGNS:  P76, R20, BP 162/55, 100% on 28% FIO2   PHYSICAL EXAMINATION: General:  Elderly female. No family at bedside. Obese. Neuro: Not responding today HEENT:  No scleral icterus. Moist mucus membranes. Tracheostomy in place. Cardiovascular:  Regular rate. No appreciable JVD.  Lungs: Respirations unlabored.  Coarse R > L Abdomen:  Soft. Normal bowel sounds. Protuberant.  Musculoskeletal:  Warm and dry. No rash on exposed skin.    Recent Labs Lab 08/04/16 0613 08/06/16 0612 08/07/16 0756  NA 136 134*  139  K 3.5 4.2 3.5  CL 103 95* 101  CO2 27 33* 29  BUN '6 11 14  '$ CREATININE 1.33* 1.65* 1.62*  GLUCOSE 179* 131* 140*    Recent Labs Lab 08/03/16 0509 08/04/16 0613 08/06/16 0612 08/07/16 0755  HGB 8.3* 8.2* 9.6* 7.3*  HCT 26.1* 25.7* 31.0* 22.9*  WBC 7.4  --  8.6 10.5  PLT 307  --  253 230   No results found.  ASSESSMENT / PLAN:  65 y.o. female with acute on chronic hypoxic respiratory  failure. Patient underwent tracheostomy on 10/20 by ENT. She has completed a seven-day course of treatment for Escherichia coli pneumonia with ciprofloxacin. Patient tolerating ATC well.   1. Acute on chronic hypoxic respiratory failure: Recommend continued ATC as she is tolerating 24 hours.  Cuff down now continue PMV tirals. Continue budesonide nebs.  2. Post tracheostomy care: Patient will need follow-up by ENT for further and ongoing care.  3.         Obesity hypoventilation syndrome: Now with worsening ATX.  R base. Consider nocturnal PS ventilation. Will discuss with attending.  Georgann Housekeeper, AGACNP-BC [facility] Pager [phone] or [phone]  08/08/2016 12:00 PM  Attending Note:  65 year old female with respiratory failure due to HCAP with e. Coli who required a tracheostomy.  Patient is weaning well and is down to TC at this point.  Unresponsive however and coarse BS on exam.  I reviewed CXR myself, trach in good position.  Discussed with RT and PCCM-NP.  Acute on chronic respiratory failure: unable to protect airway and altered.  - Titrate O2 for sat of 88-92%.  - Insure humidified air.  COPD:  - Budesonide nebs  - PRN albuterol.  Tracheostomy status:  - No decannulation given mental status.  Hypoxemia:  - Titrate O2 for sat of 88-92%.  Patient seen and examined, agree with above note.  I dictated the care and orders written for this patient under my direction.  Rush Farmer, MD [phone]

## 2016-08-09 ENCOUNTER — Other Ambulatory Visit (HOSPITAL_COMMUNITY): Payer: Self-pay

## 2016-08-09 ENCOUNTER — Encounter: Payer: Self-pay | Admitting: *Deleted

## 2016-08-09 DIAGNOSIS — K56699 Other intestinal obstruction unspecified as to partial versus complete obstruction: Secondary | ICD-10-CM | POA: Diagnosis not present

## 2016-08-09 DIAGNOSIS — K5669 Other partial intestinal obstruction: Secondary | ICD-10-CM | POA: Diagnosis not present

## 2016-08-09 DIAGNOSIS — Z452 Encounter for adjustment and management of vascular access device: Secondary | ICD-10-CM | POA: Diagnosis not present

## 2016-08-09 LAB — PHOSPHORUS: Phosphorus: 3.3 mg/dL (ref 2.5–4.6)

## 2016-08-09 LAB — COMPREHENSIVE METABOLIC PANEL
ALBUMIN: 1.7 g/dL — AB (ref 3.5–5.0)
ALT: 11 U/L — ABNORMAL LOW (ref 14–54)
ANION GAP: 9 (ref 5–15)
AST: 22 U/L (ref 15–41)
Alkaline Phosphatase: 93 U/L (ref 38–126)
BUN: 22 mg/dL — AB (ref 6–20)
CHLORIDE: 90 mmol/L — AB (ref 101–111)
CO2: 30 mmol/L (ref 22–32)
Calcium: 9.3 mg/dL (ref 8.9–10.3)
Creatinine, Ser: 1.89 mg/dL — ABNORMAL HIGH (ref 0.44–1.00)
GFR calc Af Amer: 32 mL/min — ABNORMAL LOW (ref >60)
GFR, EST NON AFRICAN AMERICAN: 27 mL/min — AB (ref >60)
GLUCOSE: 140 mg/dL — AB (ref 65–99)
POTASSIUM: 3.3 mmol/L — AB (ref 3.5–5.1)
Sodium: 129 mmol/L — ABNORMAL LOW (ref 135–145)
TOTAL PROTEIN: 5.8 g/dL — AB (ref 6.5–8.1)
Total Bilirubin: 0.7 mg/dL (ref 0.3–1.2)

## 2016-08-09 LAB — MAGNESIUM: MAGNESIUM: 1.6 mg/dL — AB (ref 1.7–2.4)

## 2016-08-09 LAB — TRIGLYCERIDES: TRIGLYCERIDES: 69 mg/dL (ref <150)

## 2016-08-09 MED ORDER — IOPAMIDOL (ISOVUE-300) INJECTION 61%
100.0000 mL | Freq: Once | INTRAVENOUS | Status: AC | PRN
  Administered 2016-08-09: 100 mL via INTRAVENOUS

## 2016-08-09 NOTE — Progress Notes (Signed)
Subjective:  No new renal function testing today. Therefore we will plan to reorder renal function testing for the a.m. dialysis has been placed on hold.   Objective:  Vital signs in last 24 hours:  Temperature 96.9 pulse 102 respirations 20 blood pressure 168/75  Physical Exam: General: Chronically ill appearing, NAD  HEENT Anicteric, moist oral mucus membranes  Neck Trach in place  Pulm/lungs normal effort, scattered rhonchi  CVS/Heart Regular, no rub  Abdomen:  PEG tube, soft NT ND, BS present  Extremities: No edema, feet in soft supports  Neurologic: Awake, following simple commands  Skin: No acute rashes  Access: Rt IJ PC       Basic Metabolic Panel:   Recent Labs Lab 08/03/16 0509 08/04/16 0613 08/06/16 0612 08/07/16 0756  NA 132* 136 134* 139  K 3.0*  3.0* 3.5 4.2 3.5  CL 98* 103 95* 101  CO2 25 27 33* 29  GLUCOSE 141* 179* 131* 140*  BUN '15 6 11 14  '$ CREATININE 2.09* 1.33* 1.65* 1.62*  CALCIUM 8.9 8.5* 9.4 8.0*  MG  --   --  1.6*  --   PHOS 3.6  --   --  2.7     CBC:  Recent Labs Lab 08/03/16 0509 08/04/16 0613 08/06/16 0612 08/07/16 0755  WBC 7.4  --  8.6 10.5  NEUTROABS  --   --  5.0  --   HGB 8.3* 8.2* 9.6* 7.3*  HCT 26.1* 25.7* 31.0* 22.9*  MCV 89.4  --  91.4 89.8  PLT 307  --  253 230      Microbiology:  Recent Results (from the past 720 hour(s))  Culture, blood (routine x 2)     Status: Abnormal   Collection Time: 07/14/16 12:48 PM  Result Value Ref Range Status   Specimen Description BLOOD LEFT HAND  Final   Special Requests IN PEDIATRIC BOTTLE 3CC  Final   Culture  Setup Time   Final    GRAM POSITIVE COCCI IN CLUSTERS IN PEDIATRIC BOTTLE CRITICAL RESULT CALLED TO, READ BACK BY AND VERIFIED WITH: J. ARMINGER, PHARM AT [place] ON [id number] BY Rhea Bleacher    Culture (A)  Final    STAPHYLOCOCCUS SPECIES (COAGULASE NEGATIVE) THE SIGNIFICANCE OF ISOLATING THIS ORGANISM FROM A SINGLE SET OF BLOOD CULTURES WHEN MULTIPLE SETS ARE  DRAWN IS UNCERTAIN. PLEASE NOTIFY THE MICROBIOLOGY DEPARTMENT WITHIN ONE WEEK IF SPECIATION AND SENSITIVITIES ARE REQUIRED.    Report Status 07/17/2016 FINAL  Final  Culture, blood (routine x 2)     Status: None   Collection Time: 07/14/16 12:59 PM  Result Value Ref Range Status   Specimen Description BLOOD RIGHT HAND  Final   Special Requests IN PEDIATRIC BOTTLE 3CC  Final   Culture NO GROWTH 5 DAYS  Final   Report Status 07/19/2016 FINAL  Final  Culture, respiratory (NON-Expectorated)     Status: None   Collection Time: 07/14/16  2:55 PM  Result Value Ref Range Status   Specimen Description TRACHEAL ASPIRATE  Final   Special Requests NONE  Final   Gram Stain   Final    ABUNDANT WBC PRESENT, PREDOMINANTLY PMN FEW GRAM NEGATIVE RODS RARE GRAM POSITIVE COCCI IN PAIRS    Culture FEW ESCHERICHIA COLI  Final   Report Status 07/16/2016 FINAL  Final   Organism ID, Bacteria ESCHERICHIA COLI  Final      Susceptibility   Escherichia coli - MIC*    AMPICILLIN >=32 RESISTANT Resistant  CEFAZOLIN <=4 SENSITIVE Sensitive     CEFEPIME <=1 SENSITIVE Sensitive     CEFTAZIDIME <=1 SENSITIVE Sensitive     CEFTRIAXONE <=1 SENSITIVE Sensitive     CIPROFLOXACIN 1 SENSITIVE Sensitive     GENTAMICIN <=1 SENSITIVE Sensitive     IMIPENEM <=0.25 SENSITIVE Sensitive     TRIMETH/SULFA >=320 RESISTANT Resistant     AMPICILLIN/SULBACTAM 16 INTERMEDIATE Intermediate     PIP/TAZO <=4 SENSITIVE Sensitive     Extended ESBL NEGATIVE Sensitive     * FEW ESCHERICHIA COLI  Culture, respiratory (NON-Expectorated)     Status: None   Collection Time: 07/18/16  8:04 PM  Result Value Ref Range Status   Specimen Description TRACHEAL ASPIRATE  Final   Special Requests NONE  Final   Gram Stain   Final    MODERATE WBC PRESENT,BOTH PMN AND MONONUCLEAR NO ORGANISMS SEEN    Culture RARE ESCHERICHIA COLI  Final   Report Status 07/21/2016 FINAL  Final   Organism ID, Bacteria ESCHERICHIA COLI  Final       Susceptibility   Escherichia coli - MIC*    AMPICILLIN >=32 RESISTANT Resistant     CEFAZOLIN <=4 SENSITIVE Sensitive     CEFEPIME <=1 SENSITIVE Sensitive     CEFTAZIDIME <=1 SENSITIVE Sensitive     CEFTRIAXONE <=1 SENSITIVE Sensitive     CIPROFLOXACIN 1 SENSITIVE Sensitive     GENTAMICIN <=1 SENSITIVE Sensitive     IMIPENEM <=0.25 SENSITIVE Sensitive     TRIMETH/SULFA >=320 RESISTANT Resistant     AMPICILLIN/SULBACTAM 16 INTERMEDIATE Intermediate     PIP/TAZO <=4 SENSITIVE Sensitive     Extended ESBL NEGATIVE Sensitive     * RARE ESCHERICHIA COLI    Coagulation Studies: No results for input(s): LABPROT, INR in the last 72 hours.  Urinalysis: No results for input(s): COLORURINE, LABSPEC, PHURINE, GLUCOSEU, HGBUR, BILIRUBINUR, KETONESUR, PROTEINUR, UROBILINOGEN, NITRITE, LEUKOCYTESUR in the last 72 hours.  Invalid input(s): APPERANCEUR    Imaging: Ct Abdomen Pelvis W Contrast  Result Date: 08/09/2016 CLINICAL DATA:  Continuous vomiting. Otherwise unable to give history. Chronic respiratory failure. EXAM: CT ABDOMEN AND PELVIS WITH CONTRAST TECHNIQUE: Multidetector CT imaging of the abdomen and pelvis was performed using the standard protocol following bolus administration of intravenous contrast. CONTRAST:  175m ISOVUE-300 IOPAMIDOL (ISOVUE-300) INJECTION 61% COMPARISON:  Plain radiographs 08/06/2016.  CT 06/21/2016. FINDINGS: Lower chest: Consolidation and air bronchograms LEFT lower lobe consistent with pneumonia. BILATERAL basilar atelectasis. Small pleural effusions. Hepatobiliary: Aside from small granulomas, no focal liver abnormality is seen. No gallstones, gallbladder wall thickening, or biliary dilatation. Pancreas: Unremarkable. Spleen: Normal in size without focal abnormality. Adrenals/Urinary Tract: Adrenal glands are unremarkable. Kidneys are normal, without renal calculi, focal lesion, or hydronephrosis. Bladder is unremarkable. Stomach/Bowel: Stomach is within normal  limits. Appendix is normal. Worsening small bowel obstruction, in comparison with plain films and prior CT, with transition zone suspected in the mid to distal small bowel LEFT lower quadrant. Sigmoid diverticulosis. Hiatal hernia. Gastrostomy. Vascular/Lymphatic: Unremarkable Reproductive: Calcified fibroids. Other: Anasarca of the body wall without visible abscess.Small ventral hernia. Musculoskeletal: L5-S1 anterolisthesis of grade 1. IMPRESSION: Worsening mid to distal small bowel obstruction. Suspected LEFT lower quadrant transition zone. LEFT lower lobe pneumonia. Electronically Signed   By: JStaci RighterM.D.   On: 08/09/2016 07:07     Medications:    . diatrizoate meglumine-sodium  30 mL Oral Once     Assessment/ Plan:  65y.o.African American  female with history of stroke, left-sided weakness, CT  with chronic small vessel ischemic changes, chronic lacunar infarct left basal ganglia, chronic ischemia the right side of pons, diastolic dysfunction with normal EF of 60-65%, diabetes, chronic kidney disease, chronic urinary tract infections due to incontinence  1. Acute renal failure  Likely secondary to ATN from septic shock CRRT initially started on September 30. Subsequently, Transitioned to intermittent hemodialysis.  -  Plan to repeat renal function testing tomorrow. Dialysis has been placed on all his creatinine has been trending down significantly.  2. Acute respiratory failure -  Patient appears to be doing well on trach collar. Continue to monitor respiratory status closely.Unable to be decannulated per pulmonary to go care.  3. Pneumonia Tracheal aspirate from October 20 is growing Escherichia coli Antibiotics as per internal medicine team  4. Malnutrition Albumin remains quite low at 1.4. Continue tube feeds.  5. Anemia of CKD - epeat CBC tomorrow as hemoglobin was 7.3 at last check.  6 .Hypokalemia - potassium now normalized at 3.5. Was lower last week. Continue  to monitor serum potassium periodically.   LOS: 0 Angelica Beck 11/15/20175:21 PM

## 2016-08-10 ENCOUNTER — Inpatient Hospital Stay (HOSPITAL_COMMUNITY)
Admission: EM | Admit: 2016-08-10 | Discharge: 2016-08-18 | DRG: 871 | Disposition: A | Discharge status: Other Acute Inpatient Hospital | Payer: Medicare Other | Attending: Family Medicine | Admitting: Family Medicine

## 2016-08-10 ENCOUNTER — Inpatient Hospital Stay (HOSPITAL_COMMUNITY): Payer: Medicare Other

## 2016-08-10 ENCOUNTER — Encounter (HOSPITAL_COMMUNITY): Payer: Self-pay | Admitting: Emergency Medicine

## 2016-08-10 ENCOUNTER — Other Ambulatory Visit (HOSPITAL_COMMUNITY): Payer: Self-pay

## 2016-08-10 DIAGNOSIS — Z7401 Bed confinement status: Secondary | ICD-10-CM

## 2016-08-10 DIAGNOSIS — I132 Hypertensive heart and chronic kidney disease with heart failure and with stage 5 chronic kidney disease, or end stage renal disease: Secondary | ICD-10-CM | POA: Diagnosis present

## 2016-08-10 DIAGNOSIS — N170 Acute kidney failure with tubular necrosis: Secondary | ICD-10-CM | POA: Diagnosis present

## 2016-08-10 DIAGNOSIS — Y95 Nosocomial condition: Secondary | ICD-10-CM | POA: Diagnosis present

## 2016-08-10 DIAGNOSIS — K56609 Unspecified intestinal obstruction, unspecified as to partial versus complete obstruction: Secondary | ICD-10-CM | POA: Diagnosis not present

## 2016-08-10 DIAGNOSIS — E662 Morbid (severe) obesity with alveolar hypoventilation: Secondary | ICD-10-CM | POA: Diagnosis present

## 2016-08-10 DIAGNOSIS — I5042 Chronic combined systolic (congestive) and diastolic (congestive) heart failure: Secondary | ICD-10-CM | POA: Diagnosis present

## 2016-08-10 DIAGNOSIS — J155 Pneumonia due to Escherichia coli: Secondary | ICD-10-CM | POA: Diagnosis present

## 2016-08-10 DIAGNOSIS — K567 Ileus, unspecified: Secondary | ICD-10-CM | POA: Diagnosis present

## 2016-08-10 DIAGNOSIS — N179 Acute kidney failure, unspecified: Secondary | ICD-10-CM | POA: Diagnosis present

## 2016-08-10 DIAGNOSIS — E871 Hypo-osmolality and hyponatremia: Secondary | ICD-10-CM | POA: Diagnosis present

## 2016-08-10 DIAGNOSIS — K56600 Partial intestinal obstruction, unspecified as to cause: Secondary | ICD-10-CM | POA: Diagnosis present

## 2016-08-10 DIAGNOSIS — J9811 Atelectasis: Secondary | ICD-10-CM | POA: Diagnosis present

## 2016-08-10 DIAGNOSIS — Z931 Gastrostomy status: Secondary | ICD-10-CM | POA: Diagnosis not present

## 2016-08-10 DIAGNOSIS — Z7982 Long term (current) use of aspirin: Secondary | ICD-10-CM

## 2016-08-10 DIAGNOSIS — Z6835 Body mass index (BMI) 35.0-35.9, adult: Secondary | ICD-10-CM

## 2016-08-10 DIAGNOSIS — I69354 Hemiplegia and hemiparesis following cerebral infarction affecting left non-dominant side: Secondary | ICD-10-CM | POA: Diagnosis not present

## 2016-08-10 DIAGNOSIS — N390 Urinary tract infection, site not specified: Secondary | ICD-10-CM | POA: Diagnosis present

## 2016-08-10 DIAGNOSIS — N186 End stage renal disease: Secondary | ICD-10-CM | POA: Diagnosis present

## 2016-08-10 DIAGNOSIS — E1169 Type 2 diabetes mellitus with other specified complication: Secondary | ICD-10-CM

## 2016-08-10 DIAGNOSIS — Z930 Tracheostomy status: Secondary | ICD-10-CM | POA: Diagnosis not present

## 2016-08-10 DIAGNOSIS — Z87891 Personal history of nicotine dependence: Secondary | ICD-10-CM

## 2016-08-10 DIAGNOSIS — Z794 Long term (current) use of insulin: Secondary | ICD-10-CM

## 2016-08-10 DIAGNOSIS — M1990 Unspecified osteoarthritis, unspecified site: Secondary | ICD-10-CM | POA: Diagnosis present

## 2016-08-10 DIAGNOSIS — A4151 Sepsis due to Escherichia coli [E. coli]: Principal | ICD-10-CM | POA: Diagnosis present

## 2016-08-10 DIAGNOSIS — E785 Hyperlipidemia, unspecified: Secondary | ICD-10-CM | POA: Diagnosis present

## 2016-08-10 DIAGNOSIS — E1122 Type 2 diabetes mellitus with diabetic chronic kidney disease: Secondary | ICD-10-CM | POA: Diagnosis present

## 2016-08-10 DIAGNOSIS — J9621 Acute and chronic respiratory failure with hypoxia: Secondary | ICD-10-CM | POA: Diagnosis present

## 2016-08-10 DIAGNOSIS — E669 Obesity, unspecified: Secondary | ICD-10-CM

## 2016-08-10 DIAGNOSIS — E0590 Thyrotoxicosis, unspecified without thyrotoxic crisis or storm: Secondary | ICD-10-CM | POA: Diagnosis present

## 2016-08-10 DIAGNOSIS — I5030 Unspecified diastolic (congestive) heart failure: Secondary | ICD-10-CM

## 2016-08-10 DIAGNOSIS — I2510 Atherosclerotic heart disease of native coronary artery without angina pectoris: Secondary | ICD-10-CM | POA: Diagnosis present

## 2016-08-10 DIAGNOSIS — G9340 Encephalopathy, unspecified: Secondary | ICD-10-CM | POA: Diagnosis present

## 2016-08-10 DIAGNOSIS — Z992 Dependence on renal dialysis: Secondary | ICD-10-CM

## 2016-08-10 DIAGNOSIS — R000 Tachycardia, unspecified: Secondary | ICD-10-CM | POA: Diagnosis present

## 2016-08-10 DIAGNOSIS — Z955 Presence of coronary angioplasty implant and graft: Secondary | ICD-10-CM | POA: Diagnosis not present

## 2016-08-10 DIAGNOSIS — A0472 Enterocolitis due to Clostridium difficile, not specified as recurrent: Secondary | ICD-10-CM | POA: Diagnosis present

## 2016-08-10 DIAGNOSIS — Z880 Allergy status to penicillin: Secondary | ICD-10-CM

## 2016-08-10 DIAGNOSIS — R32 Unspecified urinary incontinence: Secondary | ICD-10-CM | POA: Diagnosis present

## 2016-08-10 DIAGNOSIS — Z452 Encounter for adjustment and management of vascular access device: Secondary | ICD-10-CM | POA: Diagnosis not present

## 2016-08-10 DIAGNOSIS — E876 Hypokalemia: Secondary | ICD-10-CM | POA: Diagnosis present

## 2016-08-10 DIAGNOSIS — Z79899 Other long term (current) drug therapy: Secondary | ICD-10-CM

## 2016-08-10 DIAGNOSIS — D638 Anemia in other chronic diseases classified elsewhere: Secondary | ICD-10-CM | POA: Diagnosis present

## 2016-08-10 DIAGNOSIS — K56699 Other intestinal obstruction unspecified as to partial versus complete obstruction: Secondary | ICD-10-CM | POA: Diagnosis not present

## 2016-08-10 LAB — CBC WITH DIFFERENTIAL/PLATELET
BASOS ABS: 0 10*3/uL (ref 0.0–0.1)
Basophils Relative: 0 %
Eosinophils Absolute: 0 10*3/uL (ref 0.0–0.7)
Eosinophils Relative: 0 %
HEMATOCRIT: 23.1 % — AB (ref 36.0–46.0)
Hemoglobin: 7.7 g/dL — ABNORMAL LOW (ref 12.0–15.0)
LYMPHS ABS: 2.5 10*3/uL (ref 0.7–4.0)
Lymphocytes Relative: 10 %
MCH: 29.1 pg (ref 26.0–34.0)
MCHC: 33.3 g/dL (ref 30.0–36.0)
MCV: 87.2 fL (ref 78.0–100.0)
MONOS PCT: 8 %
Monocytes Absolute: 2 10*3/uL — ABNORMAL HIGH (ref 0.1–1.0)
NEUTROS ABS: 20.4 10*3/uL — AB (ref 1.7–7.7)
Neutrophils Relative %: 82 %
Platelets: 238 10*3/uL (ref 150–400)
RBC: 2.65 MIL/uL — ABNORMAL LOW (ref 3.87–5.11)
RDW: 14.6 % (ref 11.5–15.5)
WBC: 24.9 10*3/uL — ABNORMAL HIGH (ref 4.0–10.5)

## 2016-08-10 LAB — RENAL FUNCTION PANEL
ALBUMIN: 1.4 g/dL — AB (ref 3.5–5.0)
Anion gap: 9 (ref 5–15)
BUN: 26 mg/dL — AB (ref 6–20)
CO2: 29 mmol/L (ref 22–32)
CREATININE: 1.92 mg/dL — AB (ref 0.44–1.00)
Calcium: 8.8 mg/dL — ABNORMAL LOW (ref 8.9–10.3)
Chloride: 91 mmol/L — ABNORMAL LOW (ref 101–111)
GFR calc Af Amer: 31 mL/min — ABNORMAL LOW (ref >60)
GFR calc non Af Amer: 27 mL/min — ABNORMAL LOW (ref >60)
GLUCOSE: 119 mg/dL — AB (ref 65–99)
PHOSPHORUS: 3.6 mg/dL (ref 2.5–4.6)
POTASSIUM: 3.2 mmol/L — AB (ref 3.5–5.1)
SODIUM: 129 mmol/L — AB (ref 135–145)

## 2016-08-10 LAB — MAGNESIUM: MAGNESIUM: 1.4 mg/dL — AB (ref 1.7–2.4)

## 2016-08-10 MED ORDER — INSULIN ASPART 100 UNIT/ML ~~LOC~~ SOLN
0.0000 [IU] | SUBCUTANEOUS | Status: DC
  Administered 2016-08-11: 2 [IU] via SUBCUTANEOUS
  Administered 2016-08-11 – 2016-08-12 (×3): 1 [IU] via SUBCUTANEOUS
  Administered 2016-08-12 – 2016-08-14 (×3): 2 [IU] via SUBCUTANEOUS
  Administered 2016-08-14 – 2016-08-17 (×10): 1 [IU] via SUBCUTANEOUS
  Administered 2016-08-17: 2 [IU] via SUBCUTANEOUS
  Administered 2016-08-18: 1 [IU] via SUBCUTANEOUS
  Administered 2016-08-18: 2 [IU] via SUBCUTANEOUS
  Administered 2016-08-18 (×2): 1 [IU] via SUBCUTANEOUS

## 2016-08-10 MED ORDER — CIPROFLOXACIN IN D5W 400 MG/200ML IV SOLN: 400.0000 mg | INTRAVENOUS | Status: DC

## 2016-08-10 MED ORDER — HEPARIN SODIUM (PORCINE) 5000 UNIT/ML IJ SOLN
5000.0000 [IU] | Freq: Three times a day (TID) | INTRAMUSCULAR | Status: DC
  Administered 2016-08-10 – 2016-08-18 (×24): 5000 [IU] via SUBCUTANEOUS
  Filled 2016-08-10 (×25): qty 1

## 2016-08-10 MED ORDER — ALBUTEROL SULFATE (2.5 MG/3ML) 0.083% IN NEBU: 2.5000 mg | INHALATION_SOLUTION | RESPIRATORY_TRACT | Status: DC | PRN

## 2016-08-10 MED ORDER — DIATRIZOATE MEGLUMINE & SODIUM 66-10 % PO SOLN
90.0000 mL | Freq: Once | ORAL | Status: AC
  Administered 2016-08-11: 90 mL
  Filled 2016-08-10 (×2): qty 90

## 2016-08-10 NOTE — ED Triage Notes (Signed)
Pt arrives from specialty hospital I need on surgical consult for hernia. MD cardama had accepted pt to ED. Pt arrives with trach collar in no acute distress.

## 2016-08-10 NOTE — ED Notes (Signed)
Respiratory at bedside.

## 2016-08-10 NOTE — ED Provider Notes (Signed)
[facility] DEPT Provider Note   CSN: [id number] Arrival date & time: 08/10/16  1509     History   Chief Complaint Chief Complaint  Patient presents with  . Hernia    HPI Julie Nay is a 65 y.o. female.  65 year old female with history of multiple chronic medical diseases, status post tracheostomy, status post septic shock presents from long-term care facility with a CT scan done yesterday which showed small bowel obstruction which is been progressing. No reported vomiting. History is limited due to her current state. History is obtained via the old medical records. Currently being treated for UTI. Her physician spoke to the trauma surgeon on call who recommended the patient transferred to the ED.      Past Medical History:  Diagnosis Date  . Arthritis   . Diabetes mellitus, type 2 ([place])   . Hyperlipidemia   . Hypertension   . Hyperthyroidism   . Obesity hypoventilation syndrome ([place]) 06/20/2013  . Stroke [facility])     Patient Active Problem List   Diagnosis Date Noted  . Collapse of left lung   . S/P percutaneous endoscopic gastrostomy (PEG) tube placement ([place])   . Tracheostomy status ([place])   . HCAP (healthcare-associated pneumonia) 07/16/2016  . Encephalopathy   . Palliative care encounter   . Abdominal pain   . Pressure injury of skin 06/24/2016  . Encounter for central line placement   . Acute respiratory failure ([place]) 06/21/2016  . Septic shock ([place]) 06/20/2016  . Chronic respiratory failure with hypoxia ([place]) 11/29/2015  . Essential hypertension   . Bacterial UTI 08/06/2015  . Sepsis ([place]) 08/06/2015  . Altered mental status   . Lactic acidosis 08/02/2015  . Acute encephalopathy 08/02/2015  . History of CVA (cerebrovascular accident) 08/02/2015  . Chronic systolic HF (heart failure) ([place]) 08/02/2015  . Cognitive impairment 08/02/2015  . AKI (acute kidney injury) ([place]) 02/08/2015  . Abnormality of gait 02/08/2015  . Hyperthyroidism 02/08/2015  .  Type 2 diabetes mellitus ([place]) 02/08/2015  . Chest pain 01/19/2014  . Obesity hypoventilation syndrome ([place]) 06/20/2013  . HYPERTHYROIDISM 06/28/2009  . DIAB W/UNS COMP TYPE II/UNS NOT STATED UNCNTRL 06/28/2009  . HYPERLIPIDEMIA-MIXED 06/28/2009  . OVERWEIGHT/OBESITY 06/28/2009  . HYPERTENSION, BENIGN 06/28/2009  . CAD, NATIVE VESSEL 06/28/2009  . DYSPNEA 06/28/2009    Past Surgical History:  Procedure Laterality Date  . IR GENERIC HISTORICAL  07/12/2016   IR US GUIDE VASC ACCESS RIGHT 07/12/2016 [name], MD [facility]  . IR GENERIC HISTORICAL  07/12/2016   IR FLUORO GUIDE CV LINE RIGHT 07/12/2016 [name], MD [facility]  . IR GENERIC HISTORICAL  07/17/2016   IR GASTROSTOMY TUBE MOD SED 07/17/2016 [name], MD [facility]  . TRACHEOSTOMY TUBE PLACEMENT N/A 07/14/2016   Procedure: TRACHEOSTOMY, THYROID ISTHMUSECTOMY;  Surgeon: [name], MD;  Location: [facility];  Service: ENT;  Laterality: N/A;  . VENTRAL HERNIA REPAIR      OB History    No data available       Home Medications    Prior to Admission medications   Medication Sig Start Date End Date Taking? Authorizing Provider  albuterol (PROVENTIL) (2.5 MG/3ML) 0.083% nebulizer solution Take 3 mLs (2.5 mg total) by nebulization every 4 (four) hours as needed for wheezing or shortness of breath. 07/18/16   [name], NP  aspirin 81 MG chewable tablet Place 2 tablets (162 mg total) into feeding tube daily. 07/19/16   [name], NP  chlorhexidine gluconate, MEDLINE KIT, (  PERIDEX) 0.12 % solution 15 mLs by Mouth Rinse route 2 (two) times daily. 07/18/16   [name], NP  ciprofloxacin (CIPRO) 400 MG/200ML SOLN Inject 200 mLs (400 mg total) into the vein daily. 07/19/16   [name], NP  Darbepoetin Alfa (ARANESP) 100 MCG/0.5ML SOSY injection Inject 0.5 mLs (100 mcg total) into the vein every Tuesday with hemodialysis. 07/18/16   [name], NP  famotidine (PEPCID) 20 MG tablet Place 1  tablet (20 mg total) into feeding tube daily. 07/19/16   [name], NP  heparin 5000 UNIT/ML injection Inject 1 mL (5,000 Units total) into the skin every 8 (eight) hours. 07/18/16   [name], NP  insulin aspart (NOVOLOG) 100 UNIT/ML injection CBG < 70: implement hypoglycemia protocol CBG 70 - 120: 0 units CBG 121 - 150: 1 unit CBG 151 - 200: 2 units CBG 201 - 250: 3 units CBG 251 - 300: 5 units CBG 301 - 350: 7 units CBG 351 - 400: 9 units CBG > 400: call MD and obtain STAT lab verification 07/18/16   [name], NP  methimazole (TAPAZOLE) 10 MG tablet Place 1 tablet (10 mg total) into feeding tube daily. 07/19/16   [name], NP  metoprolol tartrate (LOPRESSOR) 25 mg/10 mL SUSP Place 10 mLs (25 mg total) into feeding tube 2 (two) times daily. 07/18/16   [name], NP  Mouthwashes (MOUTH RINSE) LIQD solution 15 mLs by Mouth Rinse route QID. 07/18/16   [name], NP  Nutritional Supplements (FEEDING SUPPLEMENT, NEPRO CARB STEADY,) LIQD Take 1,000 mLs by mouth daily. 07/18/16   [name], NP  rosuvastatin (CRESTOR) 40 MG tablet Take 1 tablet (40 mg total) by mouth at bedtime. 08/24/15   Lavon Paganini Angiulli, PA-C  vancomycin (VANCOCIN) 50 mg/mL oral solution Take 10 mLs (500 mg total) by mouth every 6 (six) hours. 07/18/16   [name], NP  vitamin C (ASCORBIC ACID) 500 MG tablet Take 500 mg by mouth daily.    Historical Provider, MD    Family History Family History  Problem Relation Age of Onset  . Diabetes Mother     Social History Social History  Substance Use Topics  . Smoking status: Former Smoker    Packs/day: 0.25    Years: 30.00    Types: Cigarettes    Quit date: 09/25/2002  . Smokeless tobacco: Never Used  . Alcohol use No     Allergies   Penicillins and Lactose intolerance (gi)   Review of Systems Review of Systems  Unable to perform ROS: Acuity of condition     Physical Exam Updated Vital Signs There were no vitals taken  for this visit.  Physical Exam  Constitutional: She is oriented to person, place, and time. She appears well-developed and well-nourished. She appears lethargic.  Non-toxic appearance. No distress.  HENT:  Head: Normocephalic and atraumatic.  Eyes: Conjunctivae, EOM and lids are normal. Pupils are equal, round, and reactive to light.  Neck: Normal range of motion. Neck supple. No tracheal deviation present. No thyroid mass present.  Cardiovascular: Normal rate, regular rhythm and normal heart sounds.  Exam reveals no gallop.   No murmur heard. Pulmonary/Chest: Effort normal and breath sounds normal. No stridor. No respiratory distress. She has no decreased breath sounds. She has no wheezes. She has no rhonchi. She has no rales.  Abdominal: Soft. Normal appearance and bowel sounds are normal. She exhibits no distension. There is no  tenderness. There is no rigidity and no guarding.  Musculoskeletal: Normal range of motion. She exhibits no edema or tenderness.  Neurological: She is oriented to person, place, and time. She appears lethargic. She displays atrophy. No cranial nerve deficit or sensory deficit. GCS eye subscore is 4. GCS verbal subscore is 5. GCS motor subscore is 6.  Skin: Skin is warm and dry. No abrasion and no rash noted.  Psychiatric: Her affect is blunt. Her speech is delayed. She is slowed.  Nursing note and vitals reviewed.    ED Treatments / Results  Labs (all labs ordered are listed, but only abnormal results are displayed) Labs Reviewed - No data to display  EKG  EKG Interpretation None       Radiology Ct Abdomen Pelvis W Contrast  Result Date: 08/09/2016 CLINICAL DATA:  Continuous vomiting. Otherwise unable to give history. Chronic respiratory failure. EXAM: CT ABDOMEN AND PELVIS WITH CONTRAST TECHNIQUE: Multidetector CT imaging of the abdomen and pelvis was performed using the standard protocol following bolus administration of intravenous contrast. CONTRAST:   128m ISOVUE-300 IOPAMIDOL (ISOVUE-300) INJECTION 61% COMPARISON:  Plain radiographs 08/06/2016.  CT 06/21/2016. FINDINGS: Lower chest: Consolidation and air bronchograms LEFT lower lobe consistent with pneumonia. BILATERAL basilar atelectasis. Small pleural effusions. Hepatobiliary: Aside from small granulomas, no focal liver abnormality is seen. No gallstones, gallbladder wall thickening, or biliary dilatation. Pancreas: Unremarkable. Spleen: Normal in size without focal abnormality. Adrenals/Urinary Tract: Adrenal glands are unremarkable. Kidneys are normal, without renal calculi, focal lesion, or hydronephrosis. Bladder is unremarkable. Stomach/Bowel: Stomach is within normal limits. Appendix is normal. Worsening small bowel obstruction, in comparison with plain films and prior CT, with transition zone suspected in the mid to distal small bowel LEFT lower quadrant. Sigmoid diverticulosis. Hiatal hernia. Gastrostomy. Vascular/Lymphatic: Unremarkable Reproductive: Calcified fibroids. Other: Anasarca of the body wall without visible abscess.Small ventral hernia. Musculoskeletal: L5-S1 anterolisthesis of grade 1. IMPRESSION: Worsening mid to distal small bowel obstruction. Suspected LEFT lower quadrant transition zone. LEFT lower lobe pneumonia. Electronically Signed   By: JStaci RighterM.D.   On: 08/09/2016 07:07   Dg Chest Port 1 View  Result Date: 08/10/2016 CLINICAL DATA:  PICC placement EXAM: PORTABLE CHEST 1 VIEW COMPARISON:  Chest radiograph from one day prior. FINDINGS: Left PICC terminates at the cavoatrial junction. Right internal jugular central venous catheter terminates at the cavoatrial junction. Tracheostomy tube tip overlies the tracheal air column at the thoracic inlet. Stable cardiomediastinal silhouette with mild cardiomegaly. No pneumothorax. No pleural effusion. Low lung volumes. Vascular crowding without overt pulmonary edema. Mild left basilar opacity appears stable. IMPRESSION: 1. Left  PICC terminates at the cavoatrial junction. 2. Low lung volumes. Vascular crowding without overt pulmonary edema. 3. Stable mild left basilar lung opacity, favor atelectasis. 4. Stable mild cardiomegaly. Electronically Signed   By: JIlona SorrelM.D.   On: 08/10/2016 11:19   Dg Chest Port 1 View  Result Date: 08/09/2016 CLINICAL DATA:  PICC placement. EXAM: PORTABLE CHEST 1 VIEW COMPARISON:  Chest radiographs 08/06/2016 FINDINGS: Left upper extremity PICC follows the course of the axillary and brachiocephalic vein, however the tip is directed cranially at the level of the clavicle in the region of the right internal jugular vein. Right-sided dialysis catheter remains in place. Tracheostomy tube remains at the thoracic inlet. Mildly improved lung aeration with decreasing bibasilar atelectasis. No new focal abnormality. Mediastinal contours are unchanged. IMPRESSION: 1. Abnormal positioning of left upper extremity PICC, with tip crossing the midline and directed cranially in the region  of the right internal jugular vein. Recommend repositioning/removal. 2. Improved lung aeration with decreasing bibasilar atelectasis. These results will be called to the ordering clinician or representative by the Radiologist Assistant, and communication documented in the PACS or zVision Dashboard. Electronically Signed   By: Jeb Levering M.D.   On: 08/09/2016 19:35   Dg Abd Portable 1v  Result Date: 08/10/2016 CLINICAL DATA:  Small bowel obstruction. EXAM: PORTABLE ABDOMEN - 1 VIEW COMPARISON:  08/09/2016.  08/06/2016. FINDINGS: 0615 hours. Supine view the abdomen shows interval progression of gaseous small bowel distention, most noticeable when comparing to 08/06/2016. Small bowel diameters up to 5.3 cm. Gastrostomy tube overlies the left upper quadrant of the abdomen. Coarse calcification over the central anatomic pelvis compatible with fibroid. IMPRESSION: Continued progression of gaseous small bowel dilatation  consistent with small bowel obstruction. Electronically Signed   By: Misty Stanley M.D.   On: 08/10/2016 08:57   Dg Abd Portable 1v  Result Date: 08/09/2016 CLINICAL DATA:  Small bowel obstruction. EXAM: PORTABLE ABDOMEN - 1 VIEW COMPARISON:  CT abdomen/ pelvis earlier this day. FINDINGS: Dilated small bowel is again seen measuring up to 4.8 cm, similar in appearance to CT earlier this day. No evidence of free air on supine view. Gastrostomy tube is seen in the left abdomen. Calcified uterine fibroids are noted in the pelvis. IMPRESSION: Dilated small bowel throughout the central abdomen consistent with small-bowel obstruction, similar in appearance to CT earlier this day. Electronically Signed   By: Jeb Levering M.D.   On: 08/09/2016 19:37    Procedures Procedures (including critical care time)  Medications Ordered in ED Medications - No data to display   Initial Impression / Assessment and Plan / ED Course  I have reviewed the triage vital signs and the nursing notes.  Pertinent labs & imaging results that were available during my care of the patient were reviewed by me and considered in my medical decision making (see chart for details).  Clinical Course   Spoke with general surgery who will manage the patient's bowel obstruction and have requested an admission to the medicine service.  Final Clinical Impressions(s) / ED Diagnoses   Final diagnoses:  None    New Prescriptions New Prescriptions   No medications on file     Lacretia Leigh, MD 08/10/16 1710

## 2016-08-10 NOTE — Consult Note (Signed)
[facility] Surgery Consult Note  Angelica Beck 1951/02/01  [id number].    Requesting MD: Hetty Blend Chief Complaint/Reason for Consult: SBO  HPI:  Angelica Beck is a 65yo female  PMH of stroke (left sided weakness) with subsequent debilitation, nonambulatory, diabetes, ESRD on HD, chronic urinary tract infections due to incontinence, for which she takes chronic antibiotics. Patient was recently admitted to [facility] from 06/20/16 though 07/18/16 for septic shock secondary to UTI; her course complicated by SBO as well as C diff. Due to poor weaning, she had a tracheostomy placed by ENT 10/20 & G-tube placement by IR on 10/23. She was discharged to [facility].  Patient returned to [facility] today due to WBC of 24.9 and CT scan concerning for partial SBO. History was difficult to obtain as patient is not a great historian. Patient is not complaining of any abdominal pain. She denies any nausea or vomiting. Her PEG tube is functioning. She has no new complaints. She is currently being treated for PNA.  ROS: All systems reviewed and otherwise negative except for as above  Family History  Problem Relation Age of Onset  . Diabetes Mother     Past Medical History:  Diagnosis Date  . Arthritis   . Diabetes mellitus, type 2 ([place])   . Hyperlipidemia   . Hypertension   . Hyperthyroidism   . Obesity hypoventilation syndrome ([place]) 06/20/2013  . Stroke [facility])     Past Surgical History:  Procedure Laterality Date  . IR GENERIC HISTORICAL  07/12/2016   IR US GUIDE VASC ACCESS RIGHT 07/12/2016 [name], MD [facility]  . IR GENERIC HISTORICAL  07/12/2016   IR FLUORO GUIDE CV LINE RIGHT 07/12/2016 [name], MD [facility]  . IR GENERIC HISTORICAL  07/17/2016   IR GASTROSTOMY TUBE MOD SED 07/17/2016 [name], MD [facility]  . TRACHEOSTOMY TUBE PLACEMENT N/A 07/14/2016   Procedure: TRACHEOSTOMY, THYROID ISTHMUSECTOMY;  Surgeon: [name], MD;  Location: [place];  Service:  ENT;  Laterality: N/A;  . VENTRAL HERNIA REPAIR      Social History:  reports that she quit smoking about 13 years ago. Her smoking use included Cigarettes. She has a 7.50 pack-year smoking history. She has never used smokeless tobacco. She reports that she does not drink alcohol or use drugs.  Allergies:  Allergies  Allergen Reactions  . Penicillins Swelling    FACIAL SWELLING  . Lactose Intolerance (Gi) Diarrhea and Nausea And Vomiting     (Not in a hospital admission)  Blood pressure 94/68, pulse 99, resp. rate 12, SpO2 99 %. Physical Exam: General: pleasant, WD/WN AA female who is laying in bed in NAD HEENT: head is normocephalic, atraumatic.  Sclera are noninjected.  PERRL.  Ears and nose without any masses or lesions.  Mouth is dry Heart: regular, rate, and rhythm.  No obvious murmurs, gallops, or rubs noted.  Palpable pedal pulses bilaterally Lungs: trach. CTAB, no wheezes, rhonchi, or rales noted.  Respiratory effort nonlabored Abd: soft, NT/ND, +BS, no masses, hernias, or organomegaly. PEG tube functioning MS: all 4 extremities are symmetrical with no cyanosis, clubbing, or edema. She does have significant atrophy Skin: warm and dry with no masses, lesions, or rashes Psych: A&Ox3 with an appropriate affect. Neuro: CM 2-12 intact, nonverbal  Results for orders placed or performed during the hospital encounter of 07/18/16 (from the past 48 hour(s))  Comprehensive metabolic panel     Status: Abnormal   Collection Time: 08/09/16  8:26 PM  Result Value  Ref Range   Sodium 129 (L) 135 - 145 mmol/L   Potassium 3.3 (L) 3.5 - 5.1 mmol/L   Chloride 90 (L) 101 - 111 mmol/L   CO2 30 22 - 32 mmol/L   Glucose, Bld 140 (H) 65 - 99 mg/dL   BUN 22 (H) 6 - 20 mg/dL   Creatinine, Ser 1.89 (H) 0.44 - 1.00 mg/dL   Calcium 9.3 8.9 - 10.3 mg/dL   Total Protein 5.8 (L) 6.5 - 8.1 g/dL   Albumin 1.7 (L) 3.5 - 5.0 g/dL   AST 22 15 - 41 U/L   ALT 11 (L) 14 - 54 U/L   Alkaline Phosphatase 93  38 - 126 U/L   Total Bilirubin 0.7 0.3 - 1.2 mg/dL   GFR calc non Af Amer 27 (L) >60 mL/min   GFR calc Af Amer 32 (L) >60 mL/min    Comment: (NOTE) The eGFR has been calculated using the CKD EPI equation. This calculation has not been validated in all clinical situations. eGFR's persistently <60 mL/min signify possible Chronic Kidney Disease.    Anion gap 9 5 - 15  Magnesium     Status: Abnormal   Collection Time: 08/09/16  8:26 PM  Result Value Ref Range   Magnesium 1.6 (L) 1.7 - 2.4 mg/dL  Phosphorus     Status: None   Collection Time: 08/09/16  8:26 PM  Result Value Ref Range   Phosphorus 3.3 2.5 - 4.6 mg/dL  Triglycerides     Status: None   Collection Time: 08/09/16  8:26 PM  Result Value Ref Range   Triglycerides 69 <150 mg/dL  Magnesium     Status: Abnormal   Collection Time: 08/10/16  8:28 AM  Result Value Ref Range   Magnesium 1.4 (L) 1.7 - 2.4 mg/dL  Renal function panel     Status: Abnormal   Collection Time: 08/10/16  8:28 AM  Result Value Ref Range   Sodium 129 (L) 135 - 145 mmol/L   Potassium 3.2 (L) 3.5 - 5.1 mmol/L   Chloride 91 (L) 101 - 111 mmol/L   CO2 29 22 - 32 mmol/L   Glucose, Bld 119 (H) 65 - 99 mg/dL   BUN 26 (H) 6 - 20 mg/dL   Creatinine, Ser 1.92 (H) 0.44 - 1.00 mg/dL   Calcium 8.8 (L) 8.9 - 10.3 mg/dL   Phosphorus 3.6 2.5 - 4.6 mg/dL   Albumin 1.4 (L) 3.5 - 5.0 g/dL   GFR calc non Af Amer 27 (L) >60 mL/min   GFR calc Af Amer 31 (L) >60 mL/min    Comment: (NOTE) The eGFR has been calculated using the CKD EPI equation. This calculation has not been validated in all clinical situations. eGFR's persistently <60 mL/min signify possible Chronic Kidney Disease.    Anion gap 9 5 - 15  CBC with Differential/Platelet     Status: Abnormal   Collection Time: 08/10/16  8:28 AM  Result Value Ref Range   WBC 24.9 (H) 4.0 - 10.5 K/uL   RBC 2.65 (L) 3.87 - 5.11 MIL/uL   Hemoglobin 7.7 (L) 12.0 - 15.0 g/dL   HCT 23.1 (L) 36.0 - 46.0 %   MCV 87.2 78.0 -  100.0 fL   MCH 29.1 26.0 - 34.0 pg   MCHC 33.3 30.0 - 36.0 g/dL   RDW 14.6 11.5 - 15.5 %   Platelets 238 150 - 400 K/uL   Neutrophils Relative % 82 %   Lymphocytes Relative 10 %  Monocytes Relative 8 %   Eosinophils Relative 0 %   Basophils Relative 0 %   Neutro Abs 20.4 (H) 1.7 - 7.7 K/uL   Lymphs Abs 2.5 0.7 - 4.0 K/uL   Monocytes Absolute 2.0 (H) 0.1 - 1.0 K/uL   Eosinophils Absolute 0.0 0.0 - 0.7 K/uL   Basophils Absolute 0.0 0.0 - 0.1 K/uL   WBC Morphology VACUOLATED NEUTROPHILS    Ct Abdomen Pelvis W Contrast  Result Date: 08/09/2016 CLINICAL DATA:  Continuous vomiting. Otherwise unable to give history. Chronic respiratory failure. EXAM: CT ABDOMEN AND PELVIS WITH CONTRAST TECHNIQUE: Multidetector CT imaging of the abdomen and pelvis was performed using the standard protocol following bolus administration of intravenous contrast. CONTRAST:  154m ISOVUE-300 IOPAMIDOL (ISOVUE-300) INJECTION 61% COMPARISON:  Plain radiographs 08/06/2016.  CT 06/21/2016. FINDINGS: Lower chest: Consolidation and air bronchograms LEFT lower lobe consistent with pneumonia. BILATERAL basilar atelectasis. Small pleural effusions. Hepatobiliary: Aside from small granulomas, no focal liver abnormality is seen. No gallstones, gallbladder wall thickening, or biliary dilatation. Pancreas: Unremarkable. Spleen: Normal in size without focal abnormality. Adrenals/Urinary Tract: Adrenal glands are unremarkable. Kidneys are normal, without renal calculi, focal lesion, or hydronephrosis. Bladder is unremarkable. Stomach/Bowel: Stomach is within normal limits. Appendix is normal. Worsening small bowel obstruction, in comparison with plain films and prior CT, with transition zone suspected in the mid to distal small bowel LEFT lower quadrant. Sigmoid diverticulosis. Hiatal hernia. Gastrostomy. Vascular/Lymphatic: Unremarkable Reproductive: Calcified fibroids. Other: Anasarca of the body wall without visible abscess.Small  ventral hernia. Musculoskeletal: L5-S1 anterolisthesis of grade 1. IMPRESSION: Worsening mid to distal small bowel obstruction. Suspected LEFT lower quadrant transition zone. LEFT lower lobe pneumonia. Electronically Signed   By: JStaci RighterM.D.   On: 08/09/2016 07:07   Dg Chest Port 1 View  Result Date: 08/10/2016 CLINICAL DATA:  PICC placement EXAM: PORTABLE CHEST 1 VIEW COMPARISON:  Chest radiograph from one day prior. FINDINGS: Left PICC terminates at the cavoatrial junction. Right internal jugular central venous catheter terminates at the cavoatrial junction. Tracheostomy tube tip overlies the tracheal air column at the thoracic inlet. Stable cardiomediastinal silhouette with mild cardiomegaly. No pneumothorax. No pleural effusion. Low lung volumes. Vascular crowding without overt pulmonary edema. Mild left basilar opacity appears stable. IMPRESSION: 1. Left PICC terminates at the cavoatrial junction. 2. Low lung volumes. Vascular crowding without overt pulmonary edema. 3. Stable mild left basilar lung opacity, favor atelectasis. 4. Stable mild cardiomegaly. Electronically Signed   By: JIlona SorrelM.D.   On: 08/10/2016 11:19   Dg Chest Port 1 View  Result Date: 08/09/2016 CLINICAL DATA:  PICC placement. EXAM: PORTABLE CHEST 1 VIEW COMPARISON:  Chest radiographs 08/06/2016 FINDINGS: Left upper extremity PICC follows the course of the axillary and brachiocephalic vein, however the tip is directed cranially at the level of the clavicle in the region of the right internal jugular vein. Right-sided dialysis catheter remains in place. Tracheostomy tube remains at the thoracic inlet. Mildly improved lung aeration with decreasing bibasilar atelectasis. No new focal abnormality. Mediastinal contours are unchanged. IMPRESSION: 1. Abnormal positioning of left upper extremity PICC, with tip crossing the midline and directed cranially in the region of the right internal jugular vein. Recommend  repositioning/removal. 2. Improved lung aeration with decreasing bibasilar atelectasis. These results will be called to the ordering clinician or representative by the Radiologist Assistant, and communication documented in the PACS or zVision Dashboard. Electronically Signed   By: MJeb LeveringM.D.   On: 08/09/2016 19:35   Dg Abd  Portable 1v  Result Date: 08/10/2016 CLINICAL DATA:  Small bowel obstruction. EXAM: PORTABLE ABDOMEN - 1 VIEW COMPARISON:  08/09/2016.  08/06/2016. FINDINGS: 0615 hours. Supine view the abdomen shows interval progression of gaseous small bowel distention, most noticeable when comparing to 08/06/2016. Small bowel diameters up to 5.3 cm. Gastrostomy tube overlies the left upper quadrant of the abdomen. Coarse calcification over the central anatomic pelvis compatible with fibroid. IMPRESSION: Continued progression of gaseous small bowel dilatation consistent with small bowel obstruction. Electronically Signed   By: Misty Stanley M.D.   On: 08/10/2016 08:57   Dg Abd Portable 1v  Result Date: 08/09/2016 CLINICAL DATA:  Small bowel obstruction. EXAM: PORTABLE ABDOMEN - 1 VIEW COMPARISON:  CT abdomen/ pelvis earlier this day. FINDINGS: Dilated small bowel is again seen measuring up to 4.8 cm, similar in appearance to CT earlier this day. No evidence of free air on supine view. Gastrostomy tube is seen in the left abdomen. Calcified uterine fibroids are noted in the pelvis. IMPRESSION: Dilated small bowel throughout the central abdomen consistent with small-bowel obstruction, similar in appearance to CT earlier this day. Electronically Signed   By: Jeb Levering M.D.   On: 08/09/2016 19:37      Assessment/Plan SBO - CT 11/15: Worsening mid to distal small bowel obstruction. Suspected LEFT lower quadrant transition zone. LEFT lower lobe pneumonia. - Abdominal XR today: Continued progression of gaseous small bowel dilatation consistent with small bowel obstruction. - Benign  abdominal exam  Leukocytosis Pneumonia H/o stroke (left sided weakness) with subsequent debilitation Nonambulatory Diabetes ESRD on HD Chronic urinary tract infections due to incontinence, for which she takes chronic antibiotics  Plan - recommend admit to medicine for assistance with her multiple medical problems. It does not appear that her leukocytosis is explained by any GI pathology. We will start her on the GI protocol and reassess tomorrow.   Jerrye Beavers, [facility] 08/10/2016, 4:01 PM Pager: [phone] Consults: [phone] Mon-Fri 7:00 am-4:30 pm Sat-Sun 7:00 am-11:30 am

## 2016-08-10 NOTE — H&P (Signed)
[facility] Admission History and Physical Service Pager: [phone]  Patient name: Angelica Beck Medical record number: [id number] Date of birth: 22-Sep-1951 Age: 65 y.o. Gender: female  Primary Care Provider: Delia Chimes, NP Consultants: General Surgery Code Status: Full  Chief Complaint: None  Assessment and Plan: Angelica Beck is a 65 y.o. female presenting with SBO. PMH is significant for stroke (residual mild left sided weakness, non-ambulatory, bedbound), T2DM, HTN, hyperthyroidism, HLD, CAD, HFpEF, ARF 2/2 ATN on intermittent HD, s/p trach and G-tube 06/2016.  SBO: Pt was noted to have a partial small bowel obstruction at her last hospitalization, which resolved on its own. CT scan at [facility] on 11/15 showing worsening mid to distal small bowel obstruction. AXR in the ED showed continued progression of gaseous small bowel dilatation, consistent with small bowel obstruction. Pt seems to not be having any symptoms of SBO. She denies any abdominal pain, nausea, or vomiting. She had a moderate-sized stool when she got to the floor. Her abdominal exam is notable for hypoactive bowel sounds, but is otherwise benign without tenderness or distension. She has a history of ventral hernia repair, but no other abdominal surgeries.  - Admit to med-surg, attending Dr. McDiarmid - Surgery following, appreciate assistance. - G-tube drain to gravity - NPO and nothing per tube for now - Pepcid 72m IV qd for SUP - I/O - Vitals per unit routine - If Pt develops pain, will likely start Morphine IV - Start gentle fluids- D5NS + KCl at 735mhr x 8 hours  Leukocytosis: WBC 24.9 with neutrophils of 20.4. At last hospitalization, but was found to have E. Coli and Klebsiella UTI, treated with 7 days of Meropenem. She was also found to have a LLL pneumonia and sputum culture grew E. Coli. She was treated with Ciprofloxacin for 7 days. She also developed C diff and was discharged  on Vancomycin PO. CT did not show any bowel wall thickening or pneumatosis so surgery does not think leukocytosis is necessarily from her small bowel. She is denying any symptoms of dysuria, cough, or congestion. She is mildly tachycardic in the high 90s/low 100s, but she normally takes Metoprolol tartrate at home and has not had any here. She is normotensive. She is afebrile. CT abd/pelvis shows a left lower lobe pneumonia. She is well-appearing on exam. No clear sign of infection. - Will treat for HCAP. Start Vanc and Aztreonam. - Will consult ID. - C diff positive. Start IV Flagyl. - Will not check a UA, as she denies dysuria and she may be colonized  - Will get blood cultures (one from the blood and one from the PICC), as Pt has a PICC and has been on HD and is at risk of a blood stream infection - Recheck AM CBC  Chronic Respiratory Failure with Trach: Trach placed 10/20 at last hospitalization. She had acute on chronic hypoxic respiratory failure secondary to inability to protect her airway (2/2 acute encephalopathy), LLL pneumonia, and obesity hypoventilation syndrome. - Continue trach collar and routine trach care - Albuterol neb every 4 hours prn  ARF 2/2 ATN, overall improved: Pt was on CRRT during last hospitalization. She was then transitioned to intermittent HD. HD has recently been placed on hold, as her creatinine has been downtrending. Cr 1.92 in the ED, up from 1.6-1.8 recently. - Consult Nephrology in the AM - Repeat AM BMET  Electrolyte Abnormalities: Na 129, K 3.2, Mag 1.4. - Start gentle fluids- D5NS + KCl (1068m at 10m60m x 8  hours to try to replace some Na and K. - Will give 1g Mag - Repeat AM BMET, mag, phos  Malodorous stool with hx of C diff: Pt noted to have a moderate-sized malodorous stool. - Will check C diff - Enteric precautions  Anemia of Chronic Disease: Hgb 7.7 in the ED. MCV 87. Iron panel from 06/2016 with normal iron, low TIBC, and high ferritin. -  Repeat AM CBC  T2DM: Last A1c 5.8%. Pt on Humalog sliding scale at home. - Will start sensitive SSI - CBGs every 4 hours  HFpEF: Last ECHO 06/21/16: EF 60-65%, G1DD. Takes Metoprolol tartrate 46m bid at home. - Switch to Metoprolol 589mIV bid while on bowel rest  Hyperthyroidism: Pt taking Methimazole 1030maily. Last TSH 11/11 was 0.641, free T4 1.74. - Spoke with pharm, okay to hold for now since patient is NPO and nothing per tube.  Hx CVA: Pt has residual left sided weakness and is non-ambulatory. Prior to her last hospitalization, she was living with her daughter. She was completely reliant on her family for ADLs and she was mostly bed bound. - Hold Aspirin and Lipitor while Pt is on bowel rest   FEN/GI: NPO, G-tube drain to gravity Prophylaxis: Heparin sq  Disposition: Back to LTAC when improved.  History of Present Illness:  Angelica Beck a 65 53o. female presenting with SBO. Pt was hospitalized from 06/20/16-07/18/16 for septic shock 2/2 UTI. During her hospital admission, she had SBO and C.diff. She required CRRT in the setting of acute on chronic renal failure. She had a difficult time weaning from the vent, so she had a tracheostomy placed on 10/20 and a G-tube placed on 10/23. [facility], which showed worsening mid to distal small bowel obstruction. [facility] who recommended that she come to the ED for further evaluation.  Pt is unable to speak with trach in place, but she follows commands and nods her head "yes" or "no". She denies abdominal pain, nausea, vomiting, constipation, or diarrhea. She denies chest pain and shortness of breath. She cannot remember when her last BM was; however, she was noted to have a loose stool when she got to the floor.  In the ED, her vitals were normal except for mild tachycardia in the low 100s. BMET significant for low K to 3.2,  Cr 1.92, WBC 24.9 with elevated neutrophils to 20.4. Surgery was consulted and started small bowel protocol. AXR performed in the ED showed continued progression of gaseous small bowel dilatation consistent with small bowel obstruction.  Review Of Systems: Per HPI with the following additions: see below  Review of Systems  Constitutional: Negative for chills and fever.  HENT: Negative for ear pain and sore throat.   Eyes: Negative for blurred vision and double vision.  Respiratory: Negative for shortness of breath and wheezing.   Cardiovascular: Negative for chest pain and leg swelling.  Gastrointestinal: Negative for abdominal pain, constipation, diarrhea, nausea and vomiting.  Genitourinary: Negative for dysuria.  Musculoskeletal: Negative for joint pain and myalgias.  Neurological: Negative for dizziness and headaches.    Patient Active Problem List   Diagnosis Date Noted  . Collapse of left lung   . S/P percutaneous endoscopic gastrostomy (PEG) tube placement ([facility] . [place] ([place] . HCAP (healthcare-associated pneumonia) 07/16/2016  . Encephalopathy   . Palliative care encounter   . Abdominal pain   .  Pressure injury of skin 06/24/2016  . Encounter for central line placement   . Acute respiratory failure ([place]) 06/21/2016  . Septic shock ([place]) 06/20/2016  . Chronic respiratory failure with hypoxia ([place]) 11/29/2015  . Essential hypertension   . Bacterial UTI 08/06/2015  . Sepsis ([place]) 08/06/2015  . Altered mental status   . Lactic acidosis 08/02/2015  . Acute encephalopathy 08/02/2015  . History of CVA (cerebrovascular accident) 08/02/2015  . Chronic systolic HF (heart failure) ([place]) 08/02/2015  . Cognitive impairment 08/02/2015  . AKI (acute kidney injury) ([place]) 02/08/2015  . Abnormality of gait 02/08/2015  . Hyperthyroidism 02/08/2015  . Type 2 diabetes mellitus ([place]) 02/08/2015  . Chest pain 01/19/2014  . Obesity hypoventilation syndrome ([place])  06/20/2013  . HYPERTHYROIDISM 06/28/2009  . DIAB W/UNS COMP TYPE II/UNS NOT STATED UNCNTRL 06/28/2009  . HYPERLIPIDEMIA-MIXED 06/28/2009  . OVERWEIGHT/OBESITY 06/28/2009  . HYPERTENSION, BENIGN 06/28/2009  . CAD, NATIVE VESSEL 06/28/2009  . DYSPNEA 06/28/2009    Past Medical History: Past Medical History:  Diagnosis Date  . Arthritis   . Diabetes mellitus, type 2 ([place])   . Hyperlipidemia   . Hypertension   . Hyperthyroidism   . Obesity hypoventilation syndrome ([place]) 06/20/2013  . Stroke [facility])     Past Surgical History: Past Surgical History:  Procedure Laterality Date  . IR GENERIC HISTORICAL  07/12/2016   IR US GUIDE VASC ACCESS RIGHT 07/12/2016 [name], MD [facility]INTER[facility]  . IR GENERIC HISTORICAL  07/12/2016   IR FLUORO GUIDE CV LINE RIGHT 07/12/2016 [name], MD [facility]  . IR GENERIC HISTORICAL  07/17/2016   IR GASTROSTOMY TUBE MOD SED 07/17/2016 [name], MD [facility]  . TRACHEOSTOMY TUBE PLACEMENT N/A 07/14/2016   Procedure: TRACHEOSTOMY, THYROID ISTHMUSECTOMY;  Surgeon: [name], MD;  Location: [facility];  Service: ENT;  Laterality: N/A;  . VENTRAL HERNIA REPAIR      Social History: Social History  Substance Use Topics  . Smoking status: Former Smoker    Packs/day: 0.25    Years: 30.00    Types: Cigarettes    Quit date: 09/25/2002  . Smokeless tobacco: Never Used  . Alcohol use No   Additional social history: none Please also refer to relevant sections of EMR.  Family History: Family History  Problem Relation Age of Onset  . Diabetes Mother     Allergies and Medications: Allergies  Allergen Reactions  . Penicillins Swelling    FACIAL SWELLING  . Lactose Intolerance (Gi) Diarrhea and Nausea And Vomiting   No current facility-administered medications on file prior to encounter.    Current Outpatient Prescriptions on File Prior to Encounter  Medication Sig Dispense Refill  . albuterol (PROVENTIL) (2.5 MG/3ML) 0.083%  nebulizer solution Take 3 mLs (2.5 mg total) by nebulization every 4 (four) hours as needed for wheezing or shortness of breath. 75 mL 12  . aspirin 81 MG chewable tablet Place 2 tablets (162 mg total) into feeding tube daily.    . chlorhexidine gluconate, MEDLINE KIT, (PERIDEX) 0.12 % solution 15 mLs by Mouth Rinse route 2 (two) times daily. 120 mL 0  . ciprofloxacin (CIPRO) 400 MG/200ML SOLN Inject 200 mLs (400 mg total) into the vein daily. 2000 mL   . Darbepoetin Alfa (ARANESP) 100 MCG/0.5ML SOSY injection Inject 0.5 mLs (100 mcg total) into the vein every Tuesday with hemodialysis. 4.2 mL   . famotidine (PEPCID) 20 MG tablet Place 1 tablet (20 mg total) into feeding tube daily.    . heparin  5000 UNIT/ML injection Inject 1 mL (5,000 Units total) into the skin every 8 (eight) hours. 1 mL   . insulin aspart (NOVOLOG) 100 UNIT/ML injection CBG < 70: implement hypoglycemia protocol CBG 70 - 120: 0 units CBG 121 - 150: 1 unit CBG 151 - 200: 2 units CBG 201 - 250: 3 units CBG 251 - 300: 5 units CBG 301 - 350: 7 units CBG 351 - 400: 9 units CBG > 400: call MD and obtain STAT lab verification 10 mL 11  . methimazole (TAPAZOLE) 10 MG tablet Place 1 tablet (10 mg total) into feeding tube daily.    . metoprolol tartrate (LOPRESSOR) 25 mg/10 mL SUSP Place 10 mLs (25 mg total) into feeding tube 2 (two) times daily.    . Mouthwashes (MOUTH RINSE) LIQD solution 15 mLs by Mouth Rinse route QID.  0  . Nutritional Supplements (FEEDING SUPPLEMENT, NEPRO CARB STEADY,) LIQD Take 1,000 mLs by mouth daily.  0  . rosuvastatin (CRESTOR) 40 MG tablet Take 1 tablet (40 mg total) by mouth at bedtime. 30 tablet 1  . vancomycin (VANCOCIN) 50 mg/mL oral solution Take 10 mLs (500 mg total) by mouth every 6 (six) hours.    . vitamin C (ASCORBIC ACID) 500 MG tablet Take 500 mg by mouth daily.      Objective: BP 114/79   Pulse 101   Resp 15   SpO2 98%  Exam: General: Chronically ill-appearing, alert Eyes: EOMI,  PERRLA ENTM: Nose normal, MMM Neck: Trach in place, no surrounding erythema Cardiovascular: Mildly tachycardic, regular rate, no murmurs Respiratory: Transmitted upper airway noises, decreased breath sounds in the lung bases bilaterally Gastrointestinal: G-tube in place without surrounding erythema, hypoactive bowel sounds, non-tender, non-distended, no rebound or guarding MSK: Mild non-pitting edema in the lower extremities bilaterally, warm and well-perfused Derm: No rashes or lesions on exposed skin Neuro: Awake, alert, follows commands, nods her head "yes" or "no", 4/5 strength in upper and lower extremities. Psych: Normal behavior  Labs and Imaging: CBC BMET   Recent Labs Lab 08/10/16 0828  WBC 24.9*  HGB 7.7*  HCT 23.1*  PLT 238    Recent Labs Lab 08/10/16 0828  NA 129*  K 3.2*  CL 91*  CO2 29  BUN 26*  CREATININE 1.92*  GLUCOSE 119*  CALCIUM 8.8*     AXR: continued progression of gaseous small bowel dilatation consistent with small bowel obstruction.  Sela Hua, MD 08/10/2016, 4:52 PM PGY-2, [place] Intern pager: [phone], text pages welcome

## 2016-08-10 NOTE — ED Notes (Signed)
6E RN called for report.  Patient was suctioned before transported to floor.  Patient stable for transport at this time.

## 2016-08-11 ENCOUNTER — Inpatient Hospital Stay (HOSPITAL_COMMUNITY): Payer: Medicare Other

## 2016-08-11 DIAGNOSIS — Z833 Family history of diabetes mellitus: Secondary | ICD-10-CM

## 2016-08-11 DIAGNOSIS — N183 Chronic kidney disease, stage 3 (moderate): Secondary | ICD-10-CM

## 2016-08-11 DIAGNOSIS — J189 Pneumonia, unspecified organism: Secondary | ICD-10-CM

## 2016-08-11 DIAGNOSIS — I69354 Hemiplegia and hemiparesis following cerebral infarction affecting left non-dominant side: Secondary | ICD-10-CM

## 2016-08-11 DIAGNOSIS — J9601 Acute respiratory failure with hypoxia: Secondary | ICD-10-CM

## 2016-08-11 DIAGNOSIS — Z978 Presence of other specified devices: Secondary | ICD-10-CM

## 2016-08-11 DIAGNOSIS — Z87891 Personal history of nicotine dependence: Secondary | ICD-10-CM

## 2016-08-11 DIAGNOSIS — Y95 Nosocomial condition: Secondary | ICD-10-CM

## 2016-08-11 LAB — BASIC METABOLIC PANEL
Anion gap: 10 (ref 5–15)
BUN: 29 mg/dL — AB (ref 6–20)
CHLORIDE: 90 mmol/L — AB (ref 101–111)
CO2: 30 mmol/L (ref 22–32)
CREATININE: 2.15 mg/dL — AB (ref 0.44–1.00)
Calcium: 9 mg/dL (ref 8.9–10.3)
GFR calc Af Amer: 27 mL/min — ABNORMAL LOW (ref >60)
GFR calc non Af Amer: 23 mL/min — ABNORMAL LOW (ref >60)
Glucose, Bld: 91 mg/dL (ref 65–99)
POTASSIUM: 3.5 mmol/L (ref 3.5–5.1)
Sodium: 130 mmol/L — ABNORMAL LOW (ref 135–145)

## 2016-08-11 LAB — CBC
HEMATOCRIT: 26.2 % — AB (ref 36.0–46.0)
Hemoglobin: 8.5 g/dL — ABNORMAL LOW (ref 12.0–15.0)
MCH: 28.1 pg (ref 26.0–34.0)
MCHC: 32.4 g/dL (ref 30.0–36.0)
MCV: 86.5 fL (ref 78.0–100.0)
PLATELETS: 258 10*3/uL (ref 150–400)
RBC: 3.03 MIL/uL — AB (ref 3.87–5.11)
RDW: 14.2 % (ref 11.5–15.5)
WBC: 22.4 10*3/uL — AB (ref 4.0–10.5)

## 2016-08-11 LAB — GLUCOSE, CAPILLARY
GLUCOSE-CAPILLARY: 102 mg/dL — AB (ref 65–99)
GLUCOSE-CAPILLARY: 147 mg/dL — AB (ref 65–99)
GLUCOSE-CAPILLARY: 152 mg/dL — AB (ref 65–99)
Glucose-Capillary: 139 mg/dL — ABNORMAL HIGH (ref 65–99)
Glucose-Capillary: 170 mg/dL — ABNORMAL HIGH (ref 65–99)

## 2016-08-11 LAB — SURGICAL PCR SCREEN
MRSA, PCR: NEGATIVE
Staphylococcus aureus: NEGATIVE

## 2016-08-11 LAB — C DIFFICILE QUICK SCREEN W PCR REFLEX
C DIFFICILE (CDIFF) INTERP: DETECTED
C Diff antigen: POSITIVE — AB
C Diff toxin: POSITIVE — AB

## 2016-08-11 LAB — MAGNESIUM: MAGNESIUM: 1.5 mg/dL — AB (ref 1.7–2.4)

## 2016-08-11 LAB — PHOSPHORUS: PHOSPHORUS: 3.8 mg/dL (ref 2.5–4.6)

## 2016-08-11 MED ORDER — VITAL HIGH PROTEIN PO LIQD
1000.0000 mL | ORAL | Status: DC
  Filled 2016-08-11 (×2): qty 1000

## 2016-08-11 MED ORDER — MAGNESIUM SULFATE IN D5W 1-5 GM/100ML-% IV SOLN
1.0000 g | Freq: Once | INTRAVENOUS | Status: AC
  Administered 2016-08-11: 1 g via INTRAVENOUS
  Filled 2016-08-11: qty 100

## 2016-08-11 MED ORDER — POTASSIUM CHLORIDE 2 MEQ/ML IV SOLN
INTRAVENOUS | Status: DC
  Administered 2016-08-11: 05:00:00 via INTRAVENOUS
  Filled 2016-08-11: qty 1000

## 2016-08-11 MED ORDER — POLYVINYL ALCOHOL 1.4 % OP SOLN
2.0000 [drp] | Freq: Two times a day (BID) | OPHTHALMIC | Status: DC
  Administered 2016-08-12 – 2016-08-18 (×14): 2 [drp] via OPHTHALMIC
  Filled 2016-08-11 (×2): qty 15

## 2016-08-11 MED ORDER — BUDESONIDE 0.5 MG/2ML IN SUSP
0.5000 mg | Freq: Two times a day (BID) | RESPIRATORY_TRACT | Status: DC
  Administered 2016-08-11 – 2016-08-18 (×15): 0.5 mg via RESPIRATORY_TRACT
  Filled 2016-08-11 (×15): qty 2

## 2016-08-11 MED ORDER — SODIUM CHLORIDE 0.9% FLUSH
10.0000 mL | INTRAVENOUS | Status: DC | PRN
Start: 2016-08-11 — End: 2016-08-18
  Administered 2016-08-13: 40 mL
  Administered 2016-08-17: 10 mL
  Filled 2016-08-11 (×2): qty 40

## 2016-08-11 MED ORDER — DEXTROSE 5 % IV SOLN
1.0000 g | Freq: Three times a day (TID) | INTRAVENOUS | Status: DC
  Administered 2016-08-11 – 2016-08-14 (×10): 1 g via INTRAVENOUS
  Filled 2016-08-11 (×14): qty 1

## 2016-08-11 MED ORDER — ORAL CARE MOUTH RINSE
15.0000 mL | Freq: Two times a day (BID) | OROMUCOSAL | Status: DC
  Administered 2016-08-11 – 2016-08-18 (×9): 15 mL via OROMUCOSAL

## 2016-08-11 MED ORDER — METRONIDAZOLE IN NACL 5-0.79 MG/ML-% IV SOLN
500.0000 mg | Freq: Three times a day (TID) | INTRAVENOUS | Status: DC
  Administered 2016-08-11: 500 mg via INTRAVENOUS
  Filled 2016-08-11: qty 100

## 2016-08-11 MED ORDER — VITAL AF 1.2 CAL PO LIQD
1000.0000 mL | ORAL | Status: DC
  Administered 2016-08-14 (×2): 1000 mL
  Filled 2016-08-11 (×6): qty 1000

## 2016-08-11 MED ORDER — METRONIDAZOLE IN NACL 5-0.79 MG/ML-% IV SOLN
500.0000 mg | Freq: Three times a day (TID) | INTRAVENOUS | Status: DC
  Administered 2016-08-11 – 2016-08-14 (×9): 500 mg via INTRAVENOUS
  Filled 2016-08-11 (×11): qty 100

## 2016-08-11 MED ORDER — VANCOMYCIN HCL IN DEXTROSE 1-5 GM/200ML-% IV SOLN
1000.0000 mg | INTRAVENOUS | Status: DC
  Administered 2016-08-12: 1000 mg via INTRAVENOUS
  Filled 2016-08-11: qty 200

## 2016-08-11 MED ORDER — SCOPOLAMINE 1 MG/3DAYS TD PT72
1.0000 | MEDICATED_PATCH | TRANSDERMAL | Status: DC
  Filled 2016-08-11: qty 1

## 2016-08-11 MED ORDER — FAMOTIDINE IN NACL 20-0.9 MG/50ML-% IV SOLN
20.0000 mg | INTRAVENOUS | Status: DC
  Administered 2016-08-11 – 2016-08-18 (×8): 20 mg via INTRAVENOUS
  Filled 2016-08-11 (×9): qty 50

## 2016-08-11 MED ORDER — IPRATROPIUM-ALBUTEROL 0.5-2.5 (3) MG/3ML IN SOLN
3.0000 mL | RESPIRATORY_TRACT | Status: DC | PRN
Start: 2016-08-11 — End: 2016-08-18

## 2016-08-11 MED ORDER — NYSTATIN 100000 UNIT/GM EX POWD
Freq: Two times a day (BID) | CUTANEOUS | Status: DC
  Administered 2016-08-12 – 2016-08-18 (×14): via TOPICAL
  Filled 2016-08-11 (×2): qty 15

## 2016-08-11 MED ORDER — CHLORHEXIDINE GLUCONATE 0.12 % MT SOLN
15.0000 mL | Freq: Two times a day (BID) | OROMUCOSAL | Status: DC
  Administered 2016-08-11 – 2016-08-18 (×14): 15 mL via OROMUCOSAL
  Filled 2016-08-11 (×12): qty 15

## 2016-08-11 MED ORDER — VANCOMYCIN HCL 10 G IV SOLR
1500.0000 mg | Freq: Once | INTRAVENOUS | Status: AC
  Administered 2016-08-11: 1500 mg via INTRAVENOUS
  Filled 2016-08-11: qty 1500

## 2016-08-11 MED ORDER — METOPROLOL TARTRATE 5 MG/5ML IV SOLN
5.0000 mg | Freq: Two times a day (BID) | INTRAVENOUS | Status: DC
  Administered 2016-08-11 – 2016-08-13 (×6): 5 mg via INTRAVENOUS
  Filled 2016-08-11 (×8): qty 5

## 2016-08-11 NOTE — Progress Notes (Signed)
Pharmacy Antibiotic Note  Angelica Beck is a 65 y.o. female admitted on 08/10/2016 with pneumonia.  Pharmacy has been consulted for vancomycin and aztreonam dosing.  Note patient was on iHD, but this was recently stopped as her SCr was improving. Unsure of last date of HD. SCr currently 2.15, appears baseline is ~1.6-1.8 based on prior lab results.  Patient also being treated for CDiff with IV Flagyl (was on PO Vancomycin PTA, but currently being evaluated for obstruction so she is NPO).  Plan: -Vancomyin '1500mg'$  IV x1 as loading dose, then '1000mg'$  IV q24h starting 11/18 -Aztreonam 1g IV q8h -Follow c/s, de-escalation (MRSA PCR neg), renal function and levels as needed -Continue IV Flagyl for CDiff- follow ability to transition to PO Vancomycin   Weight: 213 lb 6.5 oz (96.8 kg)  Temp (24hrs), Avg:98.6 F (37 C), Min:98.4 F (36.9 C), Max:98.8 F (37.1 C)   Recent Labs Lab 08/06/16 0612 08/07/16 0755 08/07/16 0756 08/09/16 2026 08/10/16 0828 08/11/16 0356  WBC 8.6 10.5  --   --  24.9* 22.4*  CREATININE 1.65*  --  1.62* 1.89* 1.92* 2.15*    Estimated Creatinine Clearance: 31.4 mL/min (by C-G formula based on SCr of 2.15 mg/dL (H)).    Allergies  Allergen Reactions  . Penicillins Swelling    FACIAL SWELLING Has patient had a PCN reaction causing immediate rash, facial/tongue/throat swelling, SOB or lightheadedness with hypotension: Yes Has patient had a PCN reaction causing severe rash involving mucus membranes or skin necrosis: No Has patient had a PCN reaction that required hospitalization: No Has patient had a PCN reaction occurring within the last 10 years: No If all of the above answers are "NO", then may proceed with Cephalosporin use.   . Lactose Intolerance (Gi) Diarrhea and Nausea And Vomiting   Antimicrobials this admission:  Flagyl 11/16 >>  Vanc 11/17 >>  Aztreonam 11/17>>  Dose adjustments this admission:  N/a  Microbiology results:  11/17 BCx:  sent 11/17 Sputum: sent  11/17 MRSA PCR: neg 11/17 CDiff: antigen and toxin pos  Thank you for allowing pharmacy to be a part of this patient's care.  Shadawn Hanaway D. Tal Neer, PharmD, BCPS Clinical Pharmacist Pager: [phone] 08/11/2016 10:30 AM

## 2016-08-11 NOTE — Progress Notes (Signed)
CRITICAL VALUE ALERT  Critical value received:  C Diff = Positive  Date of notification:  08-11-2016   Time of notification:  07:01  Critical value read back:Yes.    Nurse who received alert:  Genelle Bal, RN  MD notified (1st page):  Family Medicine Teaching Service  Time of first page:  07:03

## 2016-08-11 NOTE — Progress Notes (Signed)
LATE ENTRY:  Called by RN for second set of eyes.As per RN Patient with thick copious secretions, vomiting and aspirated.  Rn concerned about aquity of patient, Recommend calling MD for higher level of care.  VSS

## 2016-08-11 NOTE — Progress Notes (Signed)
Family Medicine Teaching Service Daily Progress Note Intern Pager: [phone]  Patient name: Angelica Beck Medical record number: [id number] Date of birth: 01-Aug-1951 Age: 65 y.o. Gender: female  Primary Care Provider: Delia Chimes, NP Consultants: General Surgery, ID, Nephrology Code Status: Full Chief Complaint: None  Assessment and Plan: Angelica Beck is a 65 y.o. female presenting with SBO. PMH is significant for stroke (residual mild left sided weakness, non-ambulatory, bedbound), T2DM, HTN, hyperthyroidism, HLD, CAD, HFpEF, ARF 2/2 ATN on intermittent HD, s/p trach and G-tube 06/2016.  Small Bowel Obstruction: S/p partial SBO at last  was noted and treated conservatively, it resolved on its own.  SBO seen on CT scan at [facility] on 11/15 so patient brought into ED.  No symptoms of SBO (no abd pain, nausea, or vomiting).  Pt had moderate-sized stool upon arrival to the floor. +hypoactive BS. Hx of ventral hernia repair, no other abdominal surgeries. - surgery following, appreciate assistance - G- tube drain to gravity - NPO and nothing per tube - pepcid '20mg'$  IV qd for SUP - I/O - If Pt develops pain, will likely start Morphine IV - continue gentle fluid D5NS + KCl at 19m/hr x 8 hours  Possible HCAP Patient recently treated for HCAP at last admission  CT this admission shows LLL PNA thought to be new, not just resolving from previous infection.  [facility] with 7d at last admission meropenem. - patient afebrile and normotensive this AM with tachycardia to 107 - WBC elevated at 22.4, elevated neutrophils at 82% on admission - sputum cultures ordered, blood cultures ordered peripherally also PICC cultures - started vanc and aztreonam this AM dosed per pharm (patient penicillin allergic) - ID consulted, appreciate recs  C diff positive this visit:  - Patient recently treated after positive test on 10/10 with vanc PO at discharge. Unclear when  abx ended.    - IV flagyl start today - follow up ID recs  Chronic Respiratory Failure with Trach: Trach placed 10/20 at last hospitalization when patient had chronic hypoxic respiratory failure due to encephalopathy that prevented her from protecting her airway as well as obesity hypoventilation syndrome.  - Continue trach collar and routine trach care - Albuterol neb every 4 hours prn  ARF 2/2 ATN: Cr trending up at 2.15 from 1.92 on admission (up from 1.6-1.8 recently).  Pt was on CRRT during last hospitalization. She was then transitioned to intermittent HD. HD has recently been placed on hold, as her creatinine has been downtrending.  - will consult Nephrology this AM - last HD on 11/10, receving intermittently per neph recs - Repeat AM BMET  Electrolyte Abnormalities: Na 130, K 3.5, Mag 1.4 > 1.5. -  Electrolyte repletion with D5NS + KCl (115m) at 7555mr x 8 hours to try to replace some Na and K. - S/p 1g mag, will give another 1g mag this AM - Repeat AM BMET, mag, phos, replete as needed  Anemia of Chronic Disease: Hgb improved to 8.5 from 7.7  MCV 86.5. Iron panel from 06/2016 with normal iron, low TIBC, and high ferritin. - monitor CBC  T2DM: Last A1c 5.8%. Pt on Humalog sliding scale at home. CBG 102-139 overnight. - continue sensitive SSI - CBGs every 4 hours  HFpEF: Last ECHO 06/21/16: EF 60-65%, G1DD. Takes Metoprolol tartrate '25mg'$  bid at home. - continue as Metoprolol '5mg'$  IV bid while on bowel rest  Hyperthyroidism: Pt taking Methimazole '10mg'$  daily. Last TSH 11/11 was 0.641, free T4 1.74. -  will hold methimazole for now since patient is NPO and nothing per tube.  Hx CVA: Pt has residual left sided weakness and is non-ambulatory. Prior to her last hospitalization, she was living with her daughter. She was completely reliant on her family for ADLs and she was mostly bed bound. - Hold Aspirin and Lipitor while Pt is on bowel rest   FEN/GI: NPO, G-tube drain to  gravity Prophylaxis: Heparin sq  Disposition: [facility]  Subjective:  Patient lies in bed, trach and peg in place.  Able to shake her head when asked about pain.  Otherwise non-verbal. States no BM since admission, although reported stool yesterday which was charted.   Objective: Temp:  [98.4 F (36.9 C)-98.6 F (37 C)] 98.6 F (37 C) (11/17 5790) Pulse Rate:  [98-122] 122 (11/17 0917) Resp:  [12-24] 20 (11/17 0917) BP: (94-135)/(62-79) 134/66 (11/17 0632) SpO2:  [96 %-99 %] 98 % (11/17 0917) FiO2 (%):  [21 %-28 %] 28 % (11/17 0917) Weight:  [96.8 kg (213 lb 6.5 oz)] 96.8 kg (213 lb 6.5 oz) (11/16 2140) Physical Exam: General: Chronically-ill appearing female lies in bed, nonverbal, trach in place Cardiovascular: RRR, no m/r/g Respiratory: +gurgling and transmitted upper airway sounds, no W/R/R appreciated though difficult to auscultate Abdomen: soft and nontender, nondistended, peg in place clean and dry, draining brown fluid with gravity Extremities: warm and well-perfused, moves all extremities equally  Laboratory: C diff positive   Recent Labs Lab 08/07/16 0755 08/10/16 0828 08/11/16 0356  WBC 10.5 24.9* 22.4*  HGB 7.3* 7.7* 8.5*  HCT 22.9* 23.1* 26.2*  PLT 230 238 258    Recent Labs Lab 08/09/16 2026 08/10/16 0828 08/11/16 0356  NA 129* 129* 130*  K 3.3* 3.2* 3.5  CL 90* 91* 90*  CO2 '30 29 30  '$ BUN 22* 26* 29*  CREATININE 1.89* 1.92* 2.15*  CALCIUM 9.3 8.8* 9.0  PROT 5.8*  --   --   BILITOT 0.7  --   --   ALKPHOS 93  --   --   ALT 11*  --   --   AST 22  --   --   GLUCOSE 140* 119* 91      Imaging/Diagnostic Tests: Dg Chest Port 1 View FINDINGS: Left PICC terminates at the cavoatrial junction. Right internal jugular central venous catheter terminates at the cavoatrial junction. Tracheostomy tube tip overlies the tracheal air column at the thoracic inlet. Stable cardiomediastinal silhouette with mild cardiomegaly. No pneumothorax. No pleural effusion.  Low lung volumes. Vascular crowding without overt pulmonary edema. Mild left basilar opacity appears stable. IMPRESSION:  1. Left PICC terminates at the cavoatrial junction.  2. Low lung volumes. Vascular crowding without overt pulmonary edema.  3. Stable mild left basilar lung opacity, favor atelectasis.  4. Stable mild cardiomegaly.    Everrett Coombe, MD 08/11/2016, 9:26 AM PGY-1, [place] Intern pager: [phone], text pages welcome

## 2016-08-11 NOTE — Consult Note (Signed)
[facility] Consult Note     Date: 08/11/2016                  Patient Name:  Angelica Beck  MRN: [id number]  DOB: 1950-10-11  Age / Sex: 65 y.o., female         PCP: Delia Chimes, NP                 Service Requesting Consult: FMTS/ Dr. McDiarmid                 Reason for Consult: AKI which required dialysis            Chief Complaint: SBO     HPI: Pt is a  59F with a PMH significant for HTN, DM II CVA and a recent prolonged hospitalization here at [facility] from 9/26-10/24 for septic shock.  Hospitalization was complicated by SBO, C diff, acute respiratory failure requiring trach, and E. Coli PNA.  She was discharged to [facility] ([facility]) where she was receiving dialysis.    Recently, her Cr was noted to have trended down significantly so HD was placed on hold.  Last HD was 11/13.    Cr uptrending from 1.89 (11/15) --> 1.92 (11/16) --> 2.15 (11/17).    She was noted to have vomiting and CT was performed which showed likely SBO again.  She remains C diff positive.  She has a leukocytosis to 24.  Past Medical History:  Diagnosis Date  . Arthritis   . Diabetes mellitus, type 2 ([place])   . Hyperlipidemia   . Hypertension   . Hyperthyroidism   . Obesity hypoventilation syndrome ([place]) 06/20/2013  . Stroke [facility])     Past Surgical History:  Procedure Laterality Date  . IR GENERIC HISTORICAL  07/12/2016   IR US GUIDE VASC ACCESS RIGHT 07/12/2016 [name], MD [facility]INTER[facility]  . IR GENERIC HISTORICAL  07/12/2016   IR FLUORO GUIDE CV LINE RIGHT 07/12/2016 [name], MD [facility]  . IR GENERIC HISTORICAL  07/17/2016   IR GASTROSTOMY TUBE MOD SED 07/17/2016 [name], MD [facility]  . TRACHEOSTOMY TUBE PLACEMENT N/A 07/14/2016   Procedure: TRACHEOSTOMY, THYROID ISTHMUSECTOMY;  Surgeon: [name], MD;  Location: [facility] OR;  Service: ENT;  Laterality: N/A;  . VENTRAL HERNIA REPAIR      Family History  Problem Relation Age of Onset  . Diabetes Mother     Social History:  reports that she quit smoking about 13 years ago. Her smoking use included Cigarettes. She has a 7.50 pack-year smoking history. She has never used smokeless tobacco. She reports that she does not drink alcohol or use drugs.  Allergies:  Allergies  Allergen Reactions  . Penicillins Swelling    FACIAL SWELLING Has patient had a PCN reaction causing immediate rash, facial/tongue/throat swelling, SOB or lightheadedness with hypotension: Yes Has patient had a PCN reaction causing severe rash involving mucus membranes or skin necrosis: No Has patient had a PCN reaction that required hospitalization: No Has patient had a PCN reaction occurring within the last 10 years: No If all of the above answers are "NO", then may proceed with Cephalosporin use.   . Lactose Intolerance (Gi) Diarrhea and Nausea And Vomiting    Medications Prior to Admission  Medication Sig Dispense Refill  . Amino Acid Infusion in D25W (CLINIMIX/DEXTROSE, 5/25,) 5 % SOLN Inject into the vein See admin instructions. Daily at 1800 (Drip rate: 30 ml/hr)    . Amino Acids-Protein Hydrolys (FEEDING SUPPLEMENT,  PRO-STAT 64,) LIQD Take 30 mLs by mouth 3 (three) times daily.    Marland Kitchen aspirin 81 MG chewable tablet Place 2 tablets (162 mg total) into feeding tube daily. (Patient taking differently: Place 81 mg into feeding tube daily. )    . atorvastatin (LIPITOR) 40 MG tablet Place 40 mg into feeding tube at bedtime as needed and may repeat dose one time if needed.    . B Complex-C-Folic Acid (NEPHRO-VITE PO) Place 1 tablet into feeding tube daily.    . budesonide (PULMICORT) 0.5 MG/2ML nebulizer solution Take 0.5 mg by nebulization 2 (two) times daily.    . cholecalciferol (VITAMIN D) 1000 units tablet 2,000 Units See admin instructions. Per tube once a day    . Darbepoetin Alfa (ARANESP) 100 MCG/0.5ML SOSY injection Inject 0.5 mLs (100 mcg total) into the vein every Tuesday with hemodialysis. (Patient taking  differently: Inject 60 mcg into the skin every Wednesday. ) 4.2 mL   . dextrose 50 % solution Inject into the vein See admin instructions. Per sliding scale as needed for low blood glucose D50W: 25 ml < 22; 23 ml  22-28; 25m  29-35; 112m 36-41; 179m42-48; 49m16m9-55; 13ml37m-61; 11ml 7m70; CALL MD >70    . Dextrose-Sodium Chloride (DEXTROSE 5 % AND 0.45 % NACL) 5-0.45 % Inject into the vein See admin instructions. D5 1/2 NORMAL SALINE: Hypoglycemia protocol if patient is alert and NPO, run at 50 ml/hour AS NEEDED (Drip rate = 41.666 ml/hr)    . famotidine (PEPCID) 20 MG tablet Place 1 tablet (20 mg total) into feeding tube daily.    . fat emulsion (INTRALIPID) 20 % infusion Inject into the vein See admin instructions. Every Monday, Wednesday, and Friday @@ 1800 (Drip rate: 25 ml/hr)    . glucagon 1 MG injection Inject 1 mg into the muscle once as needed. Call physician to notify of protocol initiation    . Guar Gum (NUTRISOURCE FIBER) PACK See admin instructions. One pack by mouth three times a day    . heparin 5000 UNIT/ML injection Inject 1 mL (5,000 Units total) into the skin every 8 (eight) hours. 1 mL   . insulin lispro (HUMALOG) 100 UNIT/ML injection Inject 0-12 Units into the skin See admin instructions. Implement hypoglycemic protocol: BGL 70-150 = 0 units; 151-200 = 2 units; 201-250 = 4 units; 251-300 = 6 units; 301-350 = 8 units; 351-400 = 10 units; >400 = 12 units PLUS CALL DOCTOR & OBTAIN STAT LAB VERIFICATION    . ipratropium-albuterol (DUONEB) 0.5-2.5 (3) MG/3ML SOLN Take 3 mLs by nebulization 3 (three) times daily as needed.    . Melatonin 3 MG TABS Place 3 mg into feeding tube at bedtime.    . methimazole (TAPAZOLE) 10 MG tablet Place 1 tablet (10 mg total) into feeding tube daily.    . metoCLOPramide (REGLAN) 5 MG tablet Place 5 mg into feeding tube every 8 (eight) hours.    . metoprolol tartrate (LOPRESSOR) 25 mg/10 mL SUSP Place 10 mLs (25 mg total) into feeding tube 2 (two)  times daily.    . modafinil (PROVIGIL) 100 MG tablet Place 100 mg into feeding tube daily.    . morpMarland Kitchenine 1 mg/mL in sodium chloride 0.9 % 250 mL Inject 0.041 mg/hr into the vein every 6 (six) hours as needed (for pain).    . Omega-3 Fatty Acids (FISH OIL PO) Place 8,000 mg into feeding tube daily.    . ondansetron (ZOFRAN)  4 MG/2ML SOLN injection Inject 4 mg into the vein every 6 (six) hours as needed for nausea or vomiting.    Marland Kitchen oxyCODONE (OXY IR/ROXICODONE) 5 MG immediate release tablet Place 5 mg into feeding tube every 8 (eight) hours.    . polyvinyl alcohol (LIQUITEARS) 1.4 % ophthalmic solution Place 2 drops into both eyes See admin instructions. EVERY SHIFT    . scopolamine (TRANSDERM-SCOP) 1 MG/3DAYS Place 1 patch onto the skin every 3 (three) days. TO EAR AREA    . senna-docusate (SENOKOT-S) 8.6-50 MG tablet Place 1 tablet into feeding tube 2 (two) times daily as needed (for constipation).    . sodium polystyrene (KAYEXALATE) 15 GM/60ML suspension Place 30 g into feeding tube as needed.    Marland Kitchen albuterol (PROVENTIL) (2.5 MG/3ML) 0.083% nebulizer solution Take 3 mLs (2.5 mg total) by nebulization every 4 (four) hours as needed for wheezing or shortness of breath. (Patient not taking: Reported on 08/10/2016) 75 mL 12  . chlorhexidine gluconate, MEDLINE KIT, (PERIDEX) 0.12 % solution 15 mLs by Mouth Rinse route 2 (two) times daily. (Patient not taking: Reported on 08/10/2016) 120 mL 0  . ciprofloxacin (CIPRO) 400 MG/200ML SOLN Inject 200 mLs (400 mg total) into the vein daily. (Patient not taking: Reported on 08/10/2016) 2000 mL   . insulin aspart (NOVOLOG) 100 UNIT/ML injection CBG < 70: implement hypoglycemia protocol CBG 70 - 120: 0 units CBG 121 - 150: 1 unit CBG 151 - 200: 2 units CBG 201 - 250: 3 units CBG 251 - 300: 5 units CBG 301 - 350: 7 units CBG 351 - 400: 9 units CBG > 400: call MD and obtain STAT lab verification (Patient not taking: Reported on 08/10/2016) 10 mL 11  .  Mouthwashes (MOUTH RINSE) LIQD solution 15 mLs by Mouth Rinse route QID. (Patient not taking: Reported on 08/10/2016)  0  . Nutritional Supplements (FEEDING SUPPLEMENT, NEPRO CARB STEADY,) LIQD Take 1,000 mLs by mouth daily. (Patient not taking: Reported on 08/10/2016)  0  . rosuvastatin (CRESTOR) 40 MG tablet Take 1 tablet (40 mg total) by mouth at bedtime. (Patient not taking: Reported on 08/10/2016) 30 tablet 1  . vancomycin (VANCOCIN) 50 mg/mL oral solution Take 10 mLs (500 mg total) by mouth every 6 (six) hours. (Patient not taking: Reported on 08/10/2016)    . vitamin C (ASCORBIC ACID) 500 MG tablet Take 500 mg by mouth daily.      Results for orders placed or performed during the hospital encounter of 08/10/16 (from the past 48 hour(s))  Glucose, capillary     Status: Abnormal   Collection Time: 08/11/16 12:22 AM  Result Value Ref Range   Glucose-Capillary 139 (H) 65 - 99 mg/dL  C difficile quick scan w PCR reflex     Status: Abnormal   Collection Time: 08/11/16  1:27 AM  Result Value Ref Range   C Diff antigen POSITIVE (A) NEGATIVE   C Diff toxin POSITIVE (A) NEGATIVE   C Diff interpretation Toxin producing C. difficile detected.     Comment: CRITICAL RESULT CALLED TO, READ BACK BY AND VERIFIED WITH: ANDY BRAKE,RN '@[id number]'  08/11/16 MKELLY,MLT   Surgical pcr screen     Status: None   Collection Time: 08/11/16  1:28 AM  Result Value Ref Range   MRSA, PCR NEGATIVE NEGATIVE   Staphylococcus aureus NEGATIVE NEGATIVE    Comment:        The Xpert SA Assay (FDA approved for NASAL specimens in patients over 56 years of age),  is one component of a comprehensive surveillance program.  Test performance has been validated by [facility] for patients greater than or equal to 59 year old. It is not intended to diagnose infection nor to guide or monitor treatment.   Basic metabolic panel     Status: Abnormal   Collection Time: 08/11/16  3:56 AM  Result Value Ref Range   Sodium 130 (L)  135 - 145 mmol/L   Potassium 3.5 3.5 - 5.1 mmol/L   Chloride 90 (L) 101 - 111 mmol/L   CO2 30 22 - 32 mmol/L   Glucose, Bld 91 65 - 99 mg/dL   BUN 29 (H) 6 - 20 mg/dL   Creatinine, Ser 2.15 (H) 0.44 - 1.00 mg/dL   Calcium 9.0 8.9 - 10.3 mg/dL   GFR calc non Af Amer 23 (L) >60 mL/min   GFR calc Af Amer 27 (L) >60 mL/min    Comment: (NOTE) The eGFR has been calculated using the CKD EPI equation. This calculation has not been validated in all clinical situations. eGFR's persistently <60 mL/min signify possible Chronic Kidney Disease.    Anion gap 10 5 - 15  CBC     Status: Abnormal   Collection Time: 08/11/16  3:56 AM  Result Value Ref Range   WBC 22.4 (H) 4.0 - 10.5 K/uL   RBC 3.03 (L) 3.87 - 5.11 MIL/uL   Hemoglobin 8.5 (L) 12.0 - 15.0 g/dL   HCT 26.2 (L) 36.0 - 46.0 %   MCV 86.5 78.0 - 100.0 fL   MCH 28.1 26.0 - 34.0 pg   MCHC 32.4 30.0 - 36.0 g/dL   RDW 14.2 11.5 - 15.5 %   Platelets 258 150 - 400 K/uL  Magnesium     Status: Abnormal   Collection Time: 08/11/16  3:56 AM  Result Value Ref Range   Magnesium 1.5 (L) 1.7 - 2.4 mg/dL  Phosphorus     Status: None   Collection Time: 08/11/16  3:56 AM  Result Value Ref Range   Phosphorus 3.8 2.5 - 4.6 mg/dL  Glucose, capillary     Status: Abnormal   Collection Time: 08/11/16  4:31 AM  Result Value Ref Range   Glucose-Capillary 102 (H) 65 - 99 mg/dL  Glucose, capillary     Status: Abnormal   Collection Time: 08/11/16  7:51 AM  Result Value Ref Range   Glucose-Capillary 147 (H) 65 - 99 mg/dL  Culture, respiratory (NON-Expectorated)     Status: None (Preliminary result)   Collection Time: 08/11/16  9:10 AM  Result Value Ref Range   Specimen Description TRACHEAL ASPIRATE    Special Requests NONE    Gram Stain      RARE WBC PRESENT,BOTH PMN AND MONONUCLEAR FEW SQUAMOUS EPITHELIAL CELLS PRESENT MODERATE GRAM POSITIVE RODS FEW GRAM POSITIVE COCCI IN PAIRS FEW YEAST    Culture PENDING    Report Status PENDING   Glucose,  capillary     Status: Abnormal   Collection Time: 08/11/16 11:59 AM  Result Value Ref Range   Glucose-Capillary 152 (H) 65 - 99 mg/dL   Dg Chest Port 1 View  Result Date: 08/10/2016 CLINICAL DATA:  PICC placement EXAM: PORTABLE CHEST 1 VIEW COMPARISON:  Chest radiograph from one day prior. FINDINGS: Left PICC terminates at the cavoatrial junction. Right internal jugular central venous catheter terminates at the cavoatrial junction. Tracheostomy tube tip overlies the tracheal air column at the thoracic inlet. Stable cardiomediastinal silhouette with mild cardiomegaly. No pneumothorax. No pleural  effusion. Low lung volumes. Vascular crowding without overt pulmonary edema. Mild left basilar opacity appears stable. IMPRESSION: 1. Left PICC terminates at the cavoatrial junction. 2. Low lung volumes. Vascular crowding without overt pulmonary edema. 3. Stable mild left basilar lung opacity, favor atelectasis. 4. Stable mild cardiomegaly. Electronically Signed   By: Ilona Sorrel M.D.   On: 08/10/2016 11:19   Dg Chest Port 1 View  Result Date: 08/09/2016 CLINICAL DATA:  PICC placement. EXAM: PORTABLE CHEST 1 VIEW COMPARISON:  Chest radiographs 08/06/2016 FINDINGS: Left upper extremity PICC follows the course of the axillary and brachiocephalic vein, however the tip is directed cranially at the level of the clavicle in the region of the right internal jugular vein. Right-sided dialysis catheter remains in place. Tracheostomy tube remains at the thoracic inlet. Mildly improved lung aeration with decreasing bibasilar atelectasis. No new focal abnormality. Mediastinal contours are unchanged. IMPRESSION: 1. Abnormal positioning of left upper extremity PICC, with tip crossing the midline and directed cranially in the region of the right internal jugular vein. Recommend repositioning/removal. 2. Improved lung aeration with decreasing bibasilar atelectasis. These results will be called to the ordering clinician or  representative by the Radiologist Assistant, and communication documented in the PACS or zVision Dashboard. Electronically Signed   By: Jeb Levering M.D.   On: 08/09/2016 19:35   Dg Abd Portable 1v-small Bowel Obstruction Protocol-initial, 8 Hr Delay  Result Date: 08/11/2016 CLINICAL DATA:  Partial small bowel obstruction versus ileus EXAM: PORTABLE ABDOMEN - 1 VIEW COMPARISON:  08/10/2016 FINDINGS: Gastrostomy tube stable in position. Enteral contrast material in the fundus of the stomach, and there is dilute contrast in multiple dilated small bowel loops. Contrast laterally in the right lower abdomen may represent partial opacification of the proximal ascending colon. Stable pelvic calcifications including heavily calcified uterine fibroid. Spondylitic changes in the lower lumbar spine. IMPRESSION: 1. Distal passage of dilute contrast material into dilated small bowel. Indeterminate opacification of proximal ascending colon. Follow-up films may be useful. Electronically Signed   By: Lucrezia Europe M.D.   On: 08/11/2016 10:38   Dg Abd Portable 1v  Result Date: 08/10/2016 CLINICAL DATA:  Small bowel obstruction. EXAM: PORTABLE ABDOMEN - 1 VIEW COMPARISON:  08/09/2016.  08/06/2016. FINDINGS: 0615 hours. Supine view the abdomen shows interval progression of gaseous small bowel distention, most noticeable when comparing to 08/06/2016. Small bowel diameters up to 5.3 cm. Gastrostomy tube overlies the left upper quadrant of the abdomen. Coarse calcification over the central anatomic pelvis compatible with fibroid. IMPRESSION: Continued progression of gaseous small bowel dilatation consistent with small bowel obstruction. Electronically Signed   By: Misty Stanley M.D.   On: 08/10/2016 08:57   Dg Abd Portable 1v  Result Date: 08/09/2016 CLINICAL DATA:  Small bowel obstruction. EXAM: PORTABLE ABDOMEN - 1 VIEW COMPARISON:  CT abdomen/ pelvis earlier this day. FINDINGS: Dilated small bowel is again seen  measuring up to 4.8 cm, similar in appearance to CT earlier this day. No evidence of free air on supine view. Gastrostomy tube is seen in the left abdomen. Calcified uterine fibroids are noted in the pelvis. IMPRESSION: Dilated small bowel throughout the central abdomen consistent with small-bowel obstruction, similar in appearance to CT earlier this day. Electronically Signed   By: Jeb Levering M.D.   On: 08/09/2016 19:37    ROS: all other systems reviewed and are negative except as per HPI  Blood pressure (!) 145/89, pulse (!) 122, temperature (!) 102.6 F (39.2 C), temperature source Oral, resp. rate Marland Kitchen)  32, weight 96.8 kg (213 lb 6.5 oz), SpO2 96 %. Physical Exam  GEN: chronically ill appearing HEENT: trach in place with copious secretions NECK: unable to appreciate JVD CHEST: permcath in place without drainage from exit site PULM: diffuse coarse rhonchi bilaterally CV tachycardic ABD soft, hypoactive bowel sounds, G tube to drain EXT anasarca NEURO nods head to simple, direct questioning  Assessment/Plan  1.  Acute kidney injury recently on dialysis.  Last HD 11/13.  Not sure how much urine she's making.  Cr slowly uptrending but she is essentially immobile so unclear if that will be an accurate assessment of renal function.   - strict I and O --> may have to replace Foley  - will make assessments for need for HD - overall cr trending slowly down so may in fact have some renal recovery - attempting to avoid nephrotoxins as able  2.  Recurrent SBO: surgery following.  Gtube to drain.    3.  Sepsis: WBC ct, tachycardia, many potential sources --> PNA, UTI, SBO.  On vanc/ aztreonam.  Cultures pending.  4.  Acute, now chronic Rf: s/p trach  5.  Bone/ mineral: PTH 520 9/30, phos 3.8 11/17,  Ccac 11.1 --> will investigate Vit D analogs, may need sensipar  6.  Anemia: Hgb 8.5, will get % sat and do ESA if needed.   Madelon Lips MD [facility] Cell  [phone] Pgr: [phone] 08/11/2016, 5:39 PM

## 2016-08-11 NOTE — Progress Notes (Signed)
Initial Nutrition Assessment  DOCUMENTATION CODES:   Obesity unspecified  INTERVENTION:  At 2000 tonight, start trickle feeds of Vital AF 1.2 formula via PEG at rate of 20 ml/hr which will provide 576 kcal, 36 grams of protein, and 389 ml of free water.   Per PA orders, do not increase tube feeding rate until films are seen in the AM.  Once ready to advance tube feeds, increase Vital AF 1.2 formula via PEG by 10 ml every 4 hours to goal rate of 65 ml/hr which provides 1872 kcal (100% of needs), 117 grams of protein, and 1264 ml of H2O.   RD to continue to monitor.   NUTRITION DIAGNOSIS:   Inadequate oral intake related to inability to eat as evidenced by NPO status.  GOAL:   Patient will meet greater than or equal to 90% of their needs  MONITOR:   TF tolerance, Labs, Weight trends, Skin, I & O's  REASON FOR ASSESSMENT:   Consult Enteral/tube feeding initiation and management  ASSESSMENT:   65 y.o. female presenting with SBO. PMH is significant for stroke (residual mild left sided weakness, non-ambulatory, bedbound), T2DM, HTN, hyperthyroidism, HLD, CAD, HFpEF, ARF 2/2 ATN on intermittent HD, s/p trach and G-tube 06/2016.  RD consulted for enteral/tube feeding initiation and management. Per PA order, start trickle feeds at 20 ml/hr and do not advance rate until films are seen in the AM. Pt vomiting during time of visit. RN made aware. No family at bedside. RD unable to obtain nutrition history. Weight has been stable per weight records. Plans for pt to transfer to 4E as pt needs a higher level of care. Spoke with RN, discussed holding off on initiation of trickle feeds currently as pt high risk aspiration and O2 desat earlier. RN recommends starting trickle feeds later tonight once pt is more stable. RD to order tube feeding orders.   Unable to complete Nutrition-Focused physical exam at this time.   Labs and medications reviewed.   Diet Order:  Diet NPO time  specified  Skin:  Wound (see comment) (Stg 2 on lip and coccyx)  Last BM:  11/16  Height:   Ht Readings from Last 1 Encounters:  06/20/16 '5\' 6"'$  (1.676 m)    Weight:   Wt Readings from Last 1 Encounters:  08/10/16 213 lb 6.5 oz (96.8 kg)    Ideal Body Weight:  59 kg  BMI:  Body mass index is 34.44 kg/m.  Estimated Nutritional Needs:   Kcal:  1800-2000  Protein:  100-120 grams  Fluid:  Per MD  EDUCATION NEEDS:   No education needs identified at this time  Corrin Parker, MS, RD, LDN Pager # [phone] After hours/ weekend pager # [phone]

## 2016-08-11 NOTE — Consult Note (Signed)
[facility]  Total days of antibiotics 1        Day 1 aztreo        Day 1 metronidazole        Day 1 vanco       Reason for Consult: pneumonia and    Referring Physician: [email or web address]  Active Problems:   SBO (small bowel obstruction)    HPI: Angelica Beck is a 65 y.o. female with complicated past medical history including having a stroke with residual left sided weakness and non-ambulatory, DM2, HTN, HLD, OHS, who had prolonged hospitalization from 9/26-10/24 for severe sepsis due urinary source. Her course complicated by SBO, respiratory failure s/p intubation s/ptrach as well as having PEG, and IHD for acute on CKD. Her hospitalization course complicated by acquiring cdiff during her hospitalization. She was discharged to [facility] for rehab and receiving oral vancomycin. Her daughter stated that she no longer required the rectal tube, no longer had foul smelling BM until recently in the last 3 days prior to this admission when her mother needed to have rectal tube placed again. In addition, she started to have vomiting. She was readmitted on 11/16 due to concern for recurrent SBO since CT on 11/15 showing worsening mid to distal SBO. On admit her WBC was elevated at 25K, still having diarrhea due to having rectal tube. She was found to have LLL infiltrate on CXR. She was started on aztreonam and vancomycin for pneumonia. She was retested for cdifficile which was still positive and started on iv metronidazole since unable to take meds by PEG due to SBO protocol  Past Medical History:  Diagnosis Date  . Arthritis   . Diabetes mellitus, type 2 ([place])   . Hyperlipidemia   . Hypertension   . Hyperthyroidism   . Obesity hypoventilation syndrome ([place]) 06/20/2013  . Stroke [facility])     Allergies:  Allergies  Allergen Reactions  . Penicillins Swelling    FACIAL SWELLING Has patient had a PCN reaction causing immediate rash, facial/tongue/throat swelling, SOB or  lightheadedness with hypotension: Yes Has patient had a PCN reaction causing severe rash involving mucus membranes or skin necrosis: No Has patient had a PCN reaction that required hospitalization: No Has patient had a PCN reaction occurring within the last 10 years: No If all of the above answers are "NO", then may proceed with Cephalosporin use.   . Lactose Intolerance (Gi) Diarrhea and Nausea And Vomiting    MEDICATIONS: . aztreonam  1 g Intravenous Q8H  . budesonide  0.5 mg Nebulization BID  . chlorhexidine  15 mL Mouth Rinse BID  . famotidine (PEPCID) IV  20 mg Intravenous Q24H  . feeding supplement (VITAL AF 1.2 CAL)  1,000 mL Per Tube Q24H  . heparin  5,000 Units Subcutaneous Q8H  . insulin aspart  0-9 Units Subcutaneous Q4H  . mouth rinse  15 mL Mouth Rinse q12n4p  . metoprolol  5 mg Intravenous Q12H  . metronidazole  500 mg Intravenous Q8H  . nystatin   Topical BID  . polyvinyl alcohol  2 drop Both Eyes BID  . [START ON 08/12/2016] vancomycin  1,000 mg Intravenous Q24H    Social History  Substance Use Topics  . Smoking status: Former Smoker    Packs/day: 0.25    Years: 30.00    Types: Cigarettes    Quit date: 09/25/2002  . Smokeless tobacco: Never Used  . Alcohol use No    Family History  Problem Relation  Age of Onset  . Diabetes Mother     Review of Systems -  Unable to obtain since patient unable to speak with trach collar  OBJECTIVE: Temp:  [98.4 F (36.9 C)-102.6 F (39.2 C)] 102.6 F (39.2 C) (11/17 1650) Pulse Rate:  [98-122] 122 (11/17 1650) Resp:  [16-32] 32 (11/17 1650) BP: (124-145)/(62-89) 145/89 (11/17 1650) SpO2:  [96 %-100 %] 96 % (11/17 1650) FiO2 (%):  [28 %] 28 % (11/17 1558) Weight:  [213 lb 6.5 oz (96.8 kg)] 213 lb 6.5 oz (96.8 kg) (11/16 2140) Physical Exam  Constitutional:  Appears chronically ill. Alert to name only does not respond to questions. mild distress.  HENT: [place]/AT, PERRLA, no scleral icterus Mouth/Throat: Oropharynx is  clear and moist. No oropharyngeal exudate. Trach collar excess secretions presently Cardiovascular: tachycardic, regular rhythm and normal heart sounds. Exam reveals no gallop and no friction rub.  No murmur heard.  Pulmonary/Chest: Effort normal and breath sounds normal. No respiratory distress.  has no wheezes.  Neck = supple, no nuchal rigidity Abdominal: Soft. Bowel sounds are normal.  exhibits no distension. There is no tenderness. Peg is placed to gravity drainage. Also rectal tube in place    LABS: Results for orders placed or performed during the hospital encounter of 08/10/16 (from the past 48 hour(s))  Glucose, capillary     Status: Abnormal   Collection Time: 08/11/16 12:22 AM  Result Value Ref Range   Glucose-Capillary 139 (H) 65 - 99 mg/dL  C difficile quick scan w PCR reflex     Status: Abnormal   Collection Time: 08/11/16  1:27 AM  Result Value Ref Range   C Diff antigen POSITIVE (A) NEGATIVE   C Diff toxin POSITIVE (A) NEGATIVE   C Diff interpretation Toxin producing C. difficile detected.     Comment: CRITICAL RESULT CALLED TO, READ BACK BY AND VERIFIED WITH: ANDY BRAKE,RN _0  08/11/16 MKELLY,MLT   Surgical pcr screen     Status: None   Collection Time: 08/11/16  1:28 AM  Result Value Ref Range   MRSA, PCR NEGATIVE NEGATIVE   Staphylococcus aureus NEGATIVE NEGATIVE    Comment:        The Xpert SA Assay (FDA approved for NASAL specimens in patients over 51 years of age), is one component of a comprehensive surveillance program.  Test performance has been validated by [facility] for patients greater than or equal to 63 year old. It is not intended to diagnose infection nor to guide or monitor treatment.   Basic metabolic panel     Status: Abnormal   Collection Time: 08/11/16  3:56 AM  Result Value Ref Range   Sodium 130 (L) 135 - 145 mmol/L   Potassium 3.5 3.5 - 5.1 mmol/L   Chloride 90 (L) 101 - 111 mmol/L   CO2 30 22 - 32 mmol/L   Glucose, Bld 91  65 - 99 mg/dL   BUN 29 (H) 6 - 20 mg/dL   Creatinine, Ser 2.15 (H) 0.44 - 1.00 mg/dL   Calcium 9.0 8.9 - 10.3 mg/dL   GFR calc non Af Amer 23 (L) >60 mL/min   GFR calc Af Amer 27 (L) >60 mL/min    Comment: (NOTE) The eGFR has been calculated using the CKD EPI equation. This calculation has not been validated in all clinical situations. eGFR's persistently <60 mL/min signify possible Chronic Kidney Disease.    Anion gap 10 5 - 15  CBC     Status: Abnormal  Collection Time: 08/11/16  3:56 AM  Result Value Ref Range   WBC 22.4 (H) 4.0 - 10.5 K/uL   RBC 3.03 (L) 3.87 - 5.11 MIL/uL   Hemoglobin 8.5 (L) 12.0 - 15.0 g/dL   HCT 26.2 (L) 36.0 - 46.0 %   MCV 86.5 78.0 - 100.0 fL   MCH 28.1 26.0 - 34.0 pg   MCHC 32.4 30.0 - 36.0 g/dL   RDW 14.2 11.5 - 15.5 %   Platelets 258 150 - 400 K/uL  Magnesium     Status: Abnormal   Collection Time: 08/11/16  3:56 AM  Result Value Ref Range   Magnesium 1.5 (L) 1.7 - 2.4 mg/dL  Phosphorus     Status: None   Collection Time: 08/11/16  3:56 AM  Result Value Ref Range   Phosphorus 3.8 2.5 - 4.6 mg/dL  Glucose, capillary     Status: Abnormal   Collection Time: 08/11/16  4:31 AM  Result Value Ref Range   Glucose-Capillary 102 (H) 65 - 99 mg/dL  Glucose, capillary     Status: Abnormal   Collection Time: 08/11/16  7:51 AM  Result Value Ref Range   Glucose-Capillary 147 (H) 65 - 99 mg/dL  Culture, respiratory (NON-Expectorated)     Status: None (Preliminary result)   Collection Time: 08/11/16  9:10 AM  Result Value Ref Range   Specimen Description TRACHEAL ASPIRATE    Special Requests NONE    Gram Stain      RARE WBC PRESENT,BOTH PMN AND MONONUCLEAR FEW SQUAMOUS EPITHELIAL CELLS PRESENT MODERATE GRAM POSITIVE RODS FEW GRAM POSITIVE COCCI IN PAIRS FEW YEAST    Culture PENDING    Report Status PENDING   Glucose, capillary     Status: Abnormal   Collection Time: 08/11/16 11:59 AM  Result Value Ref Range   Glucose-Capillary 152 (H) 65 - 99  mg/dL    MICRO: 11/17 blood cx pending 11/17 rep cx pending 11/17 cdiff positive IMAGING: Dg Chest Port 1 View  Result Date: 08/10/2016 CLINICAL DATA:  PICC placement EXAM: PORTABLE CHEST 1 VIEW COMPARISON:  Chest radiograph from one day prior. FINDINGS: Left PICC terminates at the cavoatrial junction. Right internal jugular central venous catheter terminates at the cavoatrial junction. Tracheostomy tube tip overlies the tracheal air column at the thoracic inlet. Stable cardiomediastinal silhouette with mild cardiomegaly. No pneumothorax. No pleural effusion. Low lung volumes. Vascular crowding without overt pulmonary edema. Mild left basilar opacity appears stable. IMPRESSION: 1. Left PICC terminates at the cavoatrial junction. 2. Low lung volumes. Vascular crowding without overt pulmonary edema. 3. Stable mild left basilar lung opacity, favor atelectasis. 4. Stable mild cardiomegaly. Electronically Signed   By: Ilona Sorrel M.D.   On: 08/10/2016 11:19   Dg Chest Port 1 View  Result Date: 08/09/2016 CLINICAL DATA:  PICC placement. EXAM: PORTABLE CHEST 1 VIEW COMPARISON:  Chest radiographs 08/06/2016 FINDINGS: Left upper extremity PICC follows the course of the axillary and brachiocephalic vein, however the tip is directed cranially at the level of the clavicle in the region of the right internal jugular vein. Right-sided dialysis catheter remains in place. Tracheostomy tube remains at the thoracic inlet. Mildly improved lung aeration with decreasing bibasilar atelectasis. No new focal abnormality. Mediastinal contours are unchanged. IMPRESSION: 1. Abnormal positioning of left upper extremity PICC, with tip crossing the midline and directed cranially in the region of the right internal jugular vein. Recommend repositioning/removal. 2. Improved lung aeration with decreasing bibasilar atelectasis. These results will be called to the  ordering clinician or representative by the Radiologist Assistant, and  communication documented in the PACS or zVision Dashboard. Electronically Signed   By: Jeb Levering M.D.   On: 08/09/2016 19:35   Dg Abd Portable 1v-small Bowel Obstruction Protocol-initial, 8 Hr Delay  Result Date: 08/11/2016 CLINICAL DATA:  Partial small bowel obstruction versus ileus EXAM: PORTABLE ABDOMEN - 1 VIEW COMPARISON:  08/10/2016 FINDINGS: Gastrostomy tube stable in position. Enteral contrast material in the fundus of the stomach, and there is dilute contrast in multiple dilated small bowel loops. Contrast laterally in the right lower abdomen may represent partial opacification of the proximal ascending colon. Stable pelvic calcifications including heavily calcified uterine fibroid. Spondylitic changes in the lower lumbar spine. IMPRESSION: 1. Distal passage of dilute contrast material into dilated small bowel. Indeterminate opacification of proximal ascending colon. Follow-up films may be useful. Electronically Signed   By: Lucrezia Europe M.D.   On: 08/11/2016 10:38   Dg Abd Portable 1v  Result Date: 08/10/2016 CLINICAL DATA:  Small bowel obstruction. EXAM: PORTABLE ABDOMEN - 1 VIEW COMPARISON:  08/09/2016.  08/06/2016. FINDINGS: 0615 hours. Supine view the abdomen shows interval progression of gaseous small bowel distention, most noticeable when comparing to 08/06/2016. Small bowel diameters up to 5.3 cm. Gastrostomy tube overlies the left upper quadrant of the abdomen. Coarse calcification over the central anatomic pelvis compatible with fibroid. IMPRESSION: Continued progression of gaseous small bowel dilatation consistent with small bowel obstruction. Electronically Signed   By: Misty Stanley M.D.   On: 08/10/2016 08:57   Dg Abd Portable 1v  Result Date: 08/09/2016 CLINICAL DATA:  Small bowel obstruction. EXAM: PORTABLE ABDOMEN - 1 VIEW COMPARISON:  CT abdomen/ pelvis earlier this day. FINDINGS: Dilated small bowel is again seen measuring up to 4.8 cm, similar in appearance to CT  earlier this day. No evidence of free air on supine view. Gastrostomy tube is seen in the left abdomen. Calcified uterine fibroids are noted in the pelvis. IMPRESSION: Dilated small bowel throughout the central abdomen consistent with small-bowel obstruction, similar in appearance to CT earlier this day. Electronically Signed   By: Jeb Levering M.D.   On: 08/09/2016 19:37   Assessment/Plan:  5TD F with complicated past med hx including stroke with residual left side weakness, deconditioned, s/p trach s/p peg, recent hx of sbo, cdifficile readmitted from [facility] for worsening SBO on imaging which coincides with vomiting, and worsening diarrhea with leukocytosis concerning for recurrent cdifficile. CT abdomen does not mention colitis but does show concern for SBO as well as possible infiltrates on Left lung base.  Clinically appears to have SBO = appreciate general surgery involvement. Currently on bowel rest  Recurrence/relapse of cdifficile = difficult to tell if she was on a taper for oral vancomycin prior to admit. Treatment of choice is oral vancomycin. For now can give IV metronidazole until she recovers from sbo. Leukocytosis can be explained by cdiff infection .  Possible hcap pneumonia of LLL= continue on aztreonam. Can discontinue vancomycin since she does not appear to be colonized for MRSA.   CKD 3 = will d/c vancomycin to minimize risk of aki  DR comer to follow this weekend. Will see her back on monday

## 2016-08-11 NOTE — Progress Notes (Signed)
Called report to [facility] for transfer.

## 2016-08-11 NOTE — Progress Notes (Signed)
Rapid response Denise paged patient vomiting and with thick secretions, frequent suctioning, aspirated into trach. Paged Family Medicine Dr. Burr Medico.  Pt needs to go to higher level of care, will put in order for stepdown bed.

## 2016-08-11 NOTE — Progress Notes (Signed)
Pts daughter would like to speak to surgeon about surgery.  [phone].  Lives in [place] and cannot be here until this afternoon.

## 2016-08-11 NOTE — Progress Notes (Signed)
Subjective: Gastrografin given at 1:13 AM,  8 hour Film not done yet.  Film due any time and nursing is going to call radiology.   Drainage from gastrostomy looks normal.  It is open so we don't know how long it was clamped after gastrografin was given.  Objective: Vital signs in last 24 hours: Temp:  [98.4 F (36.9 C)-98.6 F (37 C)] 98.6 F (37 C) (11/17 8242) Pulse Rate:  [98-112] 107 (11/17 0632) Resp:  [12-24] 20 (11/17 0632) BP: (94-135)/(62-79) 134/66 (11/17 0632) SpO2:  [96 %-99 %] 97 % (11/17 0632) FiO2 (%):  [21 %-28 %] 28 % (11/17 3536) Weight:  [96.8 kg (213 lb 6.5 oz)] 96.8 kg (213 lb 6.5 oz) (11/16 2140) Last BM Date: 08/10/16 NPO IV 58 Urine, none recorded Stool 25 recorded Afebrile, VSS, tachycardic,RR around 20 Na 130 Creatinine is up, and WBC - H/H is up also C diff is postive Intake/Output from previous day: 11/16 0701 - 11/17 0700 In: 58.8 [I.V.:58.8] Out: 25 [Stool:25] Intake/Output this shift: No intake/output data recorded.  General appearance: alert, cooperative and no distress Neck: trach midline, breathing comfortably GI: soft, on C diff precautions, non tenders, no  distension.    Lab Results:   Recent Labs  08/10/16 0828 08/11/16 0356  WBC 24.9* 22.4*  HGB 7.7* 8.5*  HCT 23.1* 26.2*  PLT 238 258    BMET  Recent Labs  08/10/16 0828 08/11/16 0356  NA 129* 130*  K 3.2* 3.5  CL 91* 90*  CO2 29 30  GLUCOSE 119* 91  BUN 26* 29*  CREATININE 1.92* 2.15*  CALCIUM 8.8* 9.0   PT/INR No results for input(s): LABPROT, INR in the last 72 hours.   Recent Labs Lab 08/07/16 0756 08/09/16 2026 08/10/16 0828  AST  --  22  --   ALT  --  11*  --   ALKPHOS  --  93  --   BILITOT  --  0.7  --   PROT  --  5.8*  --   ALBUMIN 1.4* 1.7* 1.4*     Lipase     Component Value Date/Time   LIPASE 16 06/24/2010 1311     Studies/Results: Dg Chest Port 1 View  Result Date: 08/10/2016 CLINICAL DATA:  PICC placement EXAM: PORTABLE  CHEST 1 VIEW COMPARISON:  Chest radiograph from one day prior. FINDINGS: Left PICC terminates at the cavoatrial junction. Right internal jugular central venous catheter terminates at the cavoatrial junction. Tracheostomy tube tip overlies the tracheal air column at the thoracic inlet. Stable cardiomediastinal silhouette with mild cardiomegaly. No pneumothorax. No pleural effusion. Low lung volumes. Vascular crowding without overt pulmonary edema. Mild left basilar opacity appears stable. IMPRESSION: 1. Left PICC terminates at the cavoatrial junction. 2. Low lung volumes. Vascular crowding without overt pulmonary edema. 3. Stable mild left basilar lung opacity, favor atelectasis. 4. Stable mild cardiomegaly. Electronically Signed   By: Ilona Sorrel M.D.   On: 08/10/2016 11:19   Dg Chest Port 1 View  Result Date: 08/09/2016 CLINICAL DATA:  PICC placement. EXAM: PORTABLE CHEST 1 VIEW COMPARISON:  Chest radiographs 08/06/2016 FINDINGS: Left upper extremity PICC follows the course of the axillary and brachiocephalic vein, however the tip is directed cranially at the level of the clavicle in the region of the right internal jugular vein. Right-sided dialysis catheter remains in place. Tracheostomy tube remains at the thoracic inlet. Mildly improved lung aeration with decreasing bibasilar atelectasis. No new focal abnormality. Mediastinal contours are unchanged. IMPRESSION: 1.  Abnormal positioning of left upper extremity PICC, with tip crossing the midline and directed cranially in the region of the right internal jugular vein. Recommend repositioning/removal. 2. Improved lung aeration with decreasing bibasilar atelectasis. These results will be called to the ordering clinician or representative by the Radiologist Assistant, and communication documented in the PACS or zVision Dashboard. Electronically Signed   By: Jeb Levering M.D.   On: 08/09/2016 19:35   Dg Abd Portable 1v  Result Date: 08/10/2016 CLINICAL  DATA:  Small bowel obstruction. EXAM: PORTABLE ABDOMEN - 1 VIEW COMPARISON:  08/09/2016.  08/06/2016. FINDINGS: 0615 hours. Supine view the abdomen shows interval progression of gaseous small bowel distention, most noticeable when comparing to 08/06/2016. Small bowel diameters up to 5.3 cm. Gastrostomy tube overlies the left upper quadrant of the abdomen. Coarse calcification over the central anatomic pelvis compatible with fibroid. IMPRESSION: Continued progression of gaseous small bowel dilatation consistent with small bowel obstruction. Electronically Signed   By: Misty Stanley M.D.   On: 08/10/2016 08:57   Dg Abd Portable 1v  Result Date: 08/09/2016 CLINICAL DATA:  Small bowel obstruction. EXAM: PORTABLE ABDOMEN - 1 VIEW COMPARISON:  CT abdomen/ pelvis earlier this day. FINDINGS: Dilated small bowel is again seen measuring up to 4.8 cm, similar in appearance to CT earlier this day. No evidence of free air on supine view. Gastrostomy tube is seen in the left abdomen. Calcified uterine fibroids are noted in the pelvis. IMPRESSION: Dilated small bowel throughout the central abdomen consistent with small-bowel obstruction, similar in appearance to CT earlier this day. Electronically Signed   By: Jeb Levering M.D.   On: 08/09/2016 19:37    Medications: . budesonide  0.5 mg Nebulization BID  . chlorhexidine  15 mL Mouth Rinse BID  . famotidine (PEPCID) IV  20 mg Intravenous Q24H  . heparin  5,000 Units Subcutaneous Q8H  . insulin aspart  0-9 Units Subcutaneous Q4H  . mouth rinse  15 mL Mouth Rinse q12n4p  . metoprolol  5 mg Intravenous Q12H  . polyvinyl alcohol  2 drop Both Eyes BID  . scopolamine  1 patch Transdermal Q72H   . 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 I have talked to her and discussed SB Protocol and what we are looking for.  I assured her we were not yet planning any surgery and her exam is pretty benign.   Awaiting film for SB protocol.    LOS: 1 day    Daymien Goth 08/11/2016 [phone]

## 2016-08-11 NOTE — Progress Notes (Signed)
Patient transferred to 4E21 via bed. Patient is trach collar, no evidence of pain or discomfort at this time. CHG bath completed.

## 2016-08-11 NOTE — Progress Notes (Signed)
Paged RT order for RT aspirate with gram stain, states will be up shortly to get it.

## 2016-08-11 NOTE — Progress Notes (Signed)
Transferred patient to 4E.  Jillyn Ledger, MBA, BSN, RN

## 2016-08-12 ENCOUNTER — Inpatient Hospital Stay (HOSPITAL_COMMUNITY): Payer: Medicare Other

## 2016-08-12 DIAGNOSIS — K56609 Unspecified intestinal obstruction, unspecified as to partial versus complete obstruction: Secondary | ICD-10-CM

## 2016-08-12 DIAGNOSIS — Z880 Allergy status to penicillin: Secondary | ICD-10-CM

## 2016-08-12 DIAGNOSIS — Z930 Tracheostomy status: Secondary | ICD-10-CM

## 2016-08-12 DIAGNOSIS — R509 Fever, unspecified: Secondary | ICD-10-CM

## 2016-08-12 DIAGNOSIS — A0472 Enterocolitis due to Clostridium difficile, not specified as recurrent: Secondary | ICD-10-CM

## 2016-08-12 LAB — PHOSPHORUS: PHOSPHORUS: 3.9 mg/dL (ref 2.5–4.6)

## 2016-08-12 LAB — BASIC METABOLIC PANEL
Anion gap: 10 (ref 5–15)
BUN: 32 mg/dL — AB (ref 6–20)
CHLORIDE: 97 mmol/L — AB (ref 101–111)
CO2: 28 mmol/L (ref 22–32)
Calcium: 7.9 mg/dL — ABNORMAL LOW (ref 8.9–10.3)
Creatinine, Ser: 2.28 mg/dL — ABNORMAL HIGH (ref 0.44–1.00)
GFR calc Af Amer: 25 mL/min — ABNORMAL LOW (ref >60)
GFR calc non Af Amer: 22 mL/min — ABNORMAL LOW (ref >60)
Glucose, Bld: 202 mg/dL — ABNORMAL HIGH (ref 65–99)
POTASSIUM: 3.2 mmol/L — AB (ref 3.5–5.1)
SODIUM: 135 mmol/L (ref 135–145)

## 2016-08-12 LAB — URINE MICROSCOPIC-ADD ON

## 2016-08-12 LAB — URINALYSIS, ROUTINE W REFLEX MICROSCOPIC
Bilirubin Urine: NEGATIVE
Glucose, UA: NEGATIVE mg/dL
KETONES UR: NEGATIVE mg/dL
NITRITE: NEGATIVE
PROTEIN: 30 mg/dL — AB
Specific Gravity, Urine: 1.021 (ref 1.005–1.030)
pH: 5.5 (ref 5.0–8.0)

## 2016-08-12 LAB — GLUCOSE, CAPILLARY
GLUCOSE-CAPILLARY: 116 mg/dL — AB (ref 65–99)
Glucose-Capillary: 100 mg/dL — ABNORMAL HIGH (ref 65–99)
Glucose-Capillary: 114 mg/dL — ABNORMAL HIGH (ref 65–99)
Glucose-Capillary: 127 mg/dL — ABNORMAL HIGH (ref 65–99)
Glucose-Capillary: 138 mg/dL — ABNORMAL HIGH (ref 65–99)
Glucose-Capillary: 153 mg/dL — ABNORMAL HIGH (ref 65–99)

## 2016-08-12 LAB — SODIUM, URINE, RANDOM: Sodium, Ur: 22 mmol/L

## 2016-08-12 LAB — IRON AND TIBC: Iron: 17 ug/dL — ABNORMAL LOW (ref 28–170)

## 2016-08-12 LAB — MAGNESIUM: MAGNESIUM: 1.9 mg/dL (ref 1.7–2.4)

## 2016-08-12 LAB — CREATININE, URINE, RANDOM: CREATININE, URINE: 65.45 mg/dL

## 2016-08-12 MED ORDER — GLYCOPYRROLATE 0.2 MG/ML IJ SOLN
0.2000 mg | INTRAMUSCULAR | Status: DC
  Administered 2016-08-12 – 2016-08-13 (×6): 0.2 mg via SUBCUTANEOUS
  Filled 2016-08-12 (×7): qty 1

## 2016-08-12 MED ORDER — POTASSIUM CHLORIDE 2 MEQ/ML IV SOLN
INTRAVENOUS | Status: AC
  Administered 2016-08-12: 01:00:00 via INTRAVENOUS
  Filled 2016-08-12: qty 1000

## 2016-08-12 MED ORDER — SODIUM CHLORIDE 0.9 % IV SOLN
INTRAVENOUS | Status: DC
  Administered 2016-08-12 – 2016-08-13 (×2): via INTRAVENOUS

## 2016-08-12 MED ORDER — POTASSIUM CHLORIDE 2 MEQ/ML IV SOLN
INTRAVENOUS | Status: DC
  Filled 2016-08-12: qty 1000

## 2016-08-12 NOTE — Progress Notes (Signed)
Subjective: Nods to questions, states no abd pain, no n/v  Objective: Vital signs in last 24 hours: Temp:  [98.8 F (37.1 C)-102.6 F (39.2 C)] 99.5 F (37.5 C) (11/18 0341) Pulse Rate:  [93-122] 101 (11/18 0833) Resp:  [0-32] 22 (11/18 0833) BP: (110-145)/(57-89) 116/66 (11/18 0600) SpO2:  [96 %-100 %] 100 % (11/18 0833) FiO2 (%):  [28 %] 28 % (11/18 0833) Weight:  [92.9 kg (204 lb 12.9 oz)] 92.9 kg (204 lb 12.9 oz) (11/18 0333) Last BM Date: 08/11/16  Intake/Output from previous day: 11/17 0701 - 11/18 0700 In: 667.5 [I.V.:217.5; IV Piggyback:450] Out: 1350 [Drains:950; Stool:400] Intake/Output this shift: No intake/output data recorded.  GI: soft nt/nd bs present  Lab Results:   Recent Labs  08/10/16 0828 08/11/16 0356  WBC 24.9* 22.4*  HGB 7.7* 8.5*  HCT 23.1* 26.2*  PLT 238 258   BMET  Recent Labs  08/11/16 0356 08/12/16 0430  NA 130* 135  K 3.5 3.2*  CL 90* 97*  CO2 30 28  GLUCOSE 91 202*  BUN 29* 32*  CREATININE 2.15* 2.28*  CALCIUM 9.0 7.9*   PT/INR No results for input(s): LABPROT, INR in the last 72 hours. ABG No results for input(s): PHART, HCO3 in the last 72 hours.  Invalid input(s): PCO2, PO2  Studies/Results: Dg Chest Port 1 View  Result Date: 08/10/2016 CLINICAL DATA:  PICC placement EXAM: PORTABLE CHEST 1 VIEW COMPARISON:  Chest radiograph from one day prior. FINDINGS: Left PICC terminates at the cavoatrial junction. Right internal jugular central venous catheter terminates at the cavoatrial junction. Tracheostomy tube tip overlies the tracheal air column at the thoracic inlet. Stable cardiomediastinal silhouette with mild cardiomegaly. No pneumothorax. No pleural effusion. Low lung volumes. Vascular crowding without overt pulmonary edema. Mild left basilar opacity appears stable. IMPRESSION: 1. Left PICC terminates at the cavoatrial junction. 2. Low lung volumes. Vascular crowding without overt pulmonary edema. 3. Stable mild left  basilar lung opacity, favor atelectasis. 4. Stable mild cardiomegaly. Electronically Signed   By: Ilona Sorrel M.D.   On: 08/10/2016 11:19   Dg Abd Portable 1v-small Bowel Obstruction Protocol-initial, 8 Hr Delay  Result Date: 08/11/2016 CLINICAL DATA:  Partial small bowel obstruction versus ileus EXAM: PORTABLE ABDOMEN - 1 VIEW COMPARISON:  08/10/2016 FINDINGS: Gastrostomy tube stable in position. Enteral contrast material in the fundus of the stomach, and there is dilute contrast in multiple dilated small bowel loops. Contrast laterally in the right lower abdomen may represent partial opacification of the proximal ascending colon. Stable pelvic calcifications including heavily calcified uterine fibroid. Spondylitic changes in the lower lumbar spine. IMPRESSION: 1. Distal passage of dilute contrast material into dilated small bowel. Indeterminate opacification of proximal ascending colon. Follow-up films may be useful. Electronically Signed   By: Lucrezia Europe M.D.   On: 08/11/2016 10:38    Anti-infectives: Anti-infectives    Start     Dose/Rate Route Frequency Ordered Stop   08/12/16 1100  vancomycin (VANCOCIN) IVPB 1000 mg/200 mL premix     1,000 mg 200 mL/hr over 60 Minutes Intravenous Every 24 hours 08/11/16 1031     08/11/16 2100  metroNIDAZOLE (FLAGYL) IVPB 500 mg     500 mg 100 mL/hr over 60 Minutes Intravenous Every 8 hours 08/11/16 1334     08/11/16 1100  vancomycin (VANCOCIN) 1,500 mg in sodium chloride 0.9 % 500 mL IVPB     1,500 mg 250 mL/hr over 120 Minutes Intravenous  Once 08/11/16 1031 08/11/16 1448   08/11/16  1100  aztreonam (AZACTAM) 1 g in dextrose 5 % 50 mL IVPB     1 g 100 mL/hr over 30 Minutes Intravenous Every 8 hours 08/11/16 1031     08/11/16 0930  metroNIDAZOLE (FLAGYL) IVPB 500 mg  Status:  Discontinued     500 mg 100 mL/hr over 60 Minutes Intravenous Every 8 hours 08/11/16 0912 08/11/16 1334   08/10/16 2200  ciprofloxacin (CIPRO) IVPB 400 mg  Status:  Discontinued      400 mg 200 mL/hr over 60 Minutes Intravenous Every 24 hours 08/10/16 2032 08/10/16 2301      Assessment/Plan: Partial SBO versus ileus.  Await follow-up films per small bowel obstruction protocol, she has stool in bag today, her exam is benign and not suggestive of sbo   [facility] 08/12/2016

## 2016-08-12 NOTE — Progress Notes (Signed)
[facility] Progress Note   Assessment/ Plan:   1.  Acute kidney injury recently on dialysis.  Last HD 11/13.  Not sure how much urine she's making.  Cr slowly uptrending but she is essentially immobile so unclear if that will be an accurate assessment of renal function.   - strict I and O  - will make assessments for need for HD - overall cr trending slowly down so may in fact have some renal recovery (although sarcopenia could have something to do with it) --> will do a 24 hour urine creatinine clearance - attempting to avoid nephrotoxins as able  2.  Recurrent SBO: surgery following.  Gtube to drain.  small bowel obst protocol  3.  Sepsis: WBC ct, tachycardia, many potential sources --> PNA, UTI, SBO, c diff.  On vanc/ aztreonam.  Cultures pending.  4.  Acute, now chronic Rf: s/p trach  5.  Bone/ mineral: PTH 520 9/30, phos 3.8 11/17,  Ccac 11.1 --> will investigate Vit D analogs, may need sensipar.  Rechecking PTH  6.  Anemia: Hgb 8.5, will get % sat and do ESA if needed.  Subjective:    dtr at bedside today   Objective:   BP 116/66   Pulse 91   Temp 98.1 F (36.7 C) (Oral)   Resp 13   Ht '5\' 6"'$  (1.676 m)   Wt 92.9 kg (204 lb 12.9 oz)   SpO2 100%   BMI 33.06 kg/m   Physical Exam: GEN: chronically ill appearing HEENT: trach in place with copious secretions NECK: unable to appreciate JVD CHEST: permcath in place without drainage from exit site PULM: diffuse coarse rhonchi bilaterally CV tachycardic ABD soft, hypoactive bowel sounds, G tube to drain EXT anasarca NEURO nods head to simple, direct questioning Labs: BMET  Recent Labs Lab 08/06/16 0612 08/07/16 0756 08/09/16 2026 08/10/16 0828 08/11/16 0356 08/12/16 0430 08/12/16 1200  NA 134* 139 129* 129* 130* 135  --   K 4.2 3.5 3.3* 3.2* 3.5 3.2*  --   CL 95* 101 90* 91* 90* 97*  --   CO2 33* '29 30 29 30 28  '$ --   GLUCOSE 131* 140* 140* 119* 91 202*  --   BUN 11 14 22* 26* 29* 32*   --   CREATININE 1.65* 1.62* 1.89* 1.92* 2.15* 2.28*  --   CALCIUM 9.4 8.0* 9.3 8.8* 9.0 7.9*  --   PHOS  --  2.7 3.3 3.6 3.8  --  3.9   CBC  Recent Labs Lab 08/06/16 0612 08/07/16 0755 08/10/16 0828 08/11/16 0356  WBC 8.6 10.5 24.9* 22.4*  NEUTROABS 5.0  --  20.4*  --   HGB 9.6* 7.3* 7.7* 8.5*  HCT 31.0* 22.9* 23.1* 26.2*  MCV 91.4 89.8 87.2 86.5  PLT 253 230 238 258    '@IMGRELPRIORS'$ @ Medications:    . aztreonam  1 g Intravenous Q8H  . budesonide  0.5 mg Nebulization BID  . chlorhexidine  15 mL Mouth Rinse BID  . famotidine (PEPCID) IV  20 mg Intravenous Q24H  . feeding supplement (VITAL AF 1.2 CAL)  1,000 mL Per Tube Q24H  . glycopyrrolate  0.2 mg Subcutaneous Q4H  . heparin  5,000 Units Subcutaneous Q8H  . insulin aspart  0-9 Units Subcutaneous Q4H  . mouth rinse  15 mL Mouth Rinse q12n4p  . metoprolol  5 mg Intravenous Q12H  . metronidazole  500 mg Intravenous Q8H  . nystatin   Topical BID  .  polyvinyl alcohol  2 drop Both Eyes BID  . vancomycin  1,000 mg Intravenous Q24H    [facility] Cell [phone] 08/12/2016, 1:14 PM

## 2016-08-12 NOTE — Progress Notes (Signed)
[place] for Infectious Disease   Reason for visit: Follow up on C diff infection  Interval History: abd xray pending, no abdominal pain indicated by nodding, + stool in bag; Tmax 102.6;  Continues on IV flagyl, IV vancomycin, IV aztreonam; has a severe penicillin allergy  Physical Exam: Constitutional:  Vitals:   08/12/16 0600 08/12/16 0833  BP: 116/66   Pulse: (!) 102 (!) 101  Resp: 20 (!) 22  Temp:     patient appears in NAD Eyes: anicteric HENT: + trach mask Respiratory: Normal respiratory effort; CTA B Cardiovascular: RRR GI: soft, nt, nd, + bs  Review of Systems: Constitutional: negative for chills Respiratory: negative for cough ROS elicited non verbally  Lab Results  Component Value Date   WBC 22.4 (H) 08/11/2016   HGB 8.5 (L) 08/11/2016   HCT 26.2 (L) 08/11/2016   MCV 86.5 08/11/2016   PLT 258 08/11/2016    Lab Results  Component Value Date   CREATININE 2.28 (H) 08/12/2016   BUN 32 (H) 08/12/2016   NA 135 08/12/2016   K 3.2 (L) 08/12/2016   CL 97 (L) 08/12/2016   CO2 28 08/12/2016    Lab Results  Component Value Date   ALT 11 (L) 08/09/2016   AST 22 08/09/2016   ALKPHOS 93 08/09/2016     Microbiology: Recent Results (from the past 240 hour(s))  C difficile quick scan w PCR reflex     Status: Abnormal   Collection Time: 08/11/16  1:27 AM  Result Value Ref Range Status   C Diff antigen POSITIVE (A) NEGATIVE Final   C Diff toxin POSITIVE (A) NEGATIVE Final   C Diff interpretation Toxin producing C. difficile detected.  Final    Comment: CRITICAL RESULT CALLED TO, READ BACK BY AND VERIFIED WITH: ANDY BRAKE,RN '@[id number]'$  08/11/16 MKELLY,MLT   Surgical pcr screen     Status: None   Collection Time: 08/11/16  1:28 AM  Result Value Ref Range Status   MRSA, PCR NEGATIVE NEGATIVE Final   Staphylococcus aureus NEGATIVE NEGATIVE Final    Comment:        The Xpert SA Assay (FDA approved for NASAL specimens in patients over 49 years of age), is  one component of a comprehensive surveillance program.  Test performance has been validated by [facility] for patients greater than or equal to 79 year old. It is not intended to diagnose infection nor to guide or monitor treatment.   Culture, blood (routine x 2)     Status: None (Preliminary result)   Collection Time: 08/11/16  4:07 AM  Result Value Ref Range Status   Specimen Description BLOOD RIGHT ARM  Final   Special Requests BOTTLES DRAWN AEROBIC AND ANAEROBIC 4ML  Final   Culture NO GROWTH 1 DAY  Final   Report Status PENDING  Incomplete  Culture, blood (routine x 2)     Status: None (Preliminary result)   Collection Time: 08/11/16  4:14 AM  Result Value Ref Range Status   Specimen Description BLOOD RIGHT ARM  Final   Special Requests IN PEDIATRIC BOTTLE 3ML  Final   Culture NO GROWTH 1 DAY  Final   Report Status PENDING  Incomplete  Culture, blood (single)     Status: None (Preliminary result)   Collection Time: 08/11/16  8:40 AM  Result Value Ref Range Status   Specimen Description BLOOD PICC LINE  Final   Special Requests BOTTLES DRAWN AEROBIC AND ANAEROBIC 5CC  Final  Culture NO GROWTH < 24 HOURS  Final   Report Status PENDING  Incomplete  Culture, respiratory (NON-Expectorated)     Status: None (Preliminary result)   Collection Time: 08/11/16  9:10 AM  Result Value Ref Range Status   Specimen Description TRACHEAL ASPIRATE  Final   Special Requests NONE  Final   Gram Stain   Final    RARE WBC PRESENT,BOTH PMN AND MONONUCLEAR FEW SQUAMOUS EPITHELIAL CELLS PRESENT MODERATE GRAM POSITIVE RODS FEW GRAM POSITIVE COCCI IN PAIRS FEW YEAST    Culture PENDING  Incomplete   Report Status PENDING  Incomplete    Impression/Plan:  1. C diff infection - + stool output, + toxin.  On IV flagyl.  Will be ideal to start oral/tube or rectal vancomycin when able if not felt to be SBO.   2. Fever - I suspect related to #1.  I doubt pneumonia without worsening hypoxia on trach  collar, blood culture ng so far 3.  SBO vs ileus - repeat xray ordered.

## 2016-08-12 NOTE — Progress Notes (Signed)
Family Medicine Teaching Service Daily Progress Note Intern Pager: [phone]  Patient name: Angelica Beck Medical record number: [id number] Date of birth: Feb 27, 1951 Age: 65 y.o. Gender: female  Primary Care Provider: Delia Chimes, NP Consultants: General Surgery, ID, Nephrology Code Status: Full Chief Complaint: None  Assessment and Plan: Angelica Beck is a 65 y.o. female presenting with SBO. PMH is significant for stroke (residual mild left sided weakness, non-ambulatory, bedbound), T2DM, HTN, hyperthyroidism, HLD, CAD, HFpEF, ARF 2/2 ATN on intermittent HD, s/p trach and G-tube 06/2016.  #Small Bowel Obstruction, Acute, Imporving:  S/p partial SBO at last  was noted and treated conservatively, it resolved on its own.  SBO seen on CT scan at [facility] on 11/15 so patient brought into ED.  No symptoms of SBO (no abd pain, nausea, or vomiting).  Pt had moderate-sized stool upon arrival to the floor with hypoactive BS. Hx of ventral hernia repair, no other abdominal surgeries. Surgery to get serial abdominal xrays to monitor for improvement. --Surgery following, appreciate recs --G-tube drain to gravity --NPO and nothing per tube --Pepcid 20 mg IV qd for SUP --Monitor I/Os --If develops pain, will likely start Morphine IV  #Healthcare Acquired Pneumonia, Acute, Improving: Patient recently treated for HAP at last admission. CT this admission shows LLL PNA thought to be new, not just resolving from previous infection. Also recently treated for [facility] and Klebsiella UTI with 7d at last admission meropenem. WBC elevated at 22.4, elevated neutrophils at 82% on admission. Concerns for aspiration PNA given copious secretions from trach. BCx NGTD. Patient afebrile since 2030 on 11/17. Remains normotensive this AM with mild tachycardia to 102. --Sputum cultures pending --Vanc and Aztreonam dosed per pharm, patient penicillin allergic (day 2) --ID consulted, appreciate recs  #H/o C-diff Infection,  Acute on Chronic, Stable: Patient recently treated after positive test on 10/10 with vanc PO at discharge. Unclear when abx ended. Positive on admission. Last bowel movement at 0600 11/18 with watery consistency. --Flagyl IV 500 mg q8h (day 2) given tx for SBO --ID consulted, appreciate recs  #Chronic Respiratory Failure with Trach, Stable:  Trach placed 10/20 at last hospitalization when patient had chronic hypoxic respiratory failure due to encephalopathy that prevented her from protecting her airway as well as obesity hypoventilation syndrome. Was on scopolamine patch but d/c'd 2/2 ARF. Has increased copious secretions from trach. Discussed with pharmacy using Robinul. --Robinul SQ 0.2 mg q4h PRN for secretions --Continue trach collar and routine trach care --Albuterol neb every 4 hours prn   #Acute Kidney Injury, Likely 2/2 ATN, Worsening:  Cr 1.92 on admission (up from 1.6-1.8 recently). Continues to rise 2.15>2.28. Pt was on CRRT during last hospitalization. She was then transitioned to intermittent HD, last 11/10. HD has recently been placed on hold, as her creatinine had been downtrending.  --Nephrology consulted, appreciate recs, plan to assess for HD if needed --Strict I/Os --Trend BMET  #Electrolyte Abnormalities, Stable:  As of 11/18, hyponatremia resolved, mild hypokalemia at 3.2 and hypomagnesemia at 1.5 (11/17). --Nephrology consulted, appreciate recs, plan to assess for possible HD 11/18 --Strict I/Os --Mg and phos levels pending, will replete as needed  #Anemia of Chronic Disease, Imporved:  Hgb improved to 8.5 from 7.7. MCV 86.5. Iron panel from 06/2016 with normal iron, low TIBC, and high ferritin.  #Diabetes Mellitus Type 2:  Last A1c 5.8%. Pt on Humalog sliding scale at home. CBG 102-139 overnight. --Sensitive SSI --CBGs every 4 hours  #HFpEF:  Last ECHO 06/21/16: EF 60-65%, G1DD. Takes Metoprolol tartrate '25mg'$   bid at home. --Continue as Metoprolol '5mg'$  IV BID  while on bowel rest  #Hyperthyroidism:  Pt taking Methimazole '10mg'$  daily. Last TSH 11/11 was 0.641, free T4 1.74. --Will hold methimazole for now since patient is NPO and nothing per tube  #H/o CVA, Stable:  Pt has residual left sided weakness and is non-ambulatory. Prior to her last hospitalization, she was living with her daughter. She was completely reliant on her family for ADLs and she was mostly bed bound. --Hold Aspirin and Lipitor while Pt is on bowel rest  FEN/GI: NPO, G-tube drain to gravity Prophylaxis: Heparin SQ  Disposition: [facility]  Subjective:  Patient lies in bed, trach and peg in place. Remains non-verbal. Last bowel movement was at 0600 11/18 and watery in consistency.   Objective: Temp:  [98.8 F (37.1 C)-102.6 F (39.2 C)] 99.5 F (37.5 C) (11/18 0341) Pulse Rate:  [93-122] 101 (11/18 0833) Resp:  [0-32] 22 (11/18 0833) BP: (110-145)/(57-89) 116/66 (11/18 0600) SpO2:  [96 %-100 %] 100 % (11/18 0833) FiO2 (%):  [28 %] 28 % (11/18 0833) Weight:  [204 lb 12.9 oz (92.9 kg)] 204 lb 12.9 oz (92.9 kg) (11/18 4270) Physical Exam: General: Chronically-ill appearing female lies in bed, nonverbal, trach in place with copious secretions Cardiovascular: RRR, no m/r/g Respiratory: +gurgling and transmitted upper airway sounds, no W/R/R appreciated though difficult to auscultate Abdomen: soft and nontender, nondistended, peg in place clean and dry, draining brown fluid with gravity Extremities: warm and well-perfused, moves all extremities equally  Laboratory:  Recent Labs Lab 08/07/16 0755 08/10/16 0828 08/11/16 0356  WBC 10.5 24.9* 22.4*  HGB 7.3* 7.7* 8.5*  HCT 22.9* 23.1* 26.2*  PLT 230 238 258    Recent Labs Lab 08/09/16 2026 08/10/16 0828 08/11/16 0356 08/12/16 0430  NA 129* 129* 130* 135  K 3.3* 3.2* 3.5 3.2*  CL 90* 91* 90* 97*  CO2 '30 29 30 28  '$ BUN 22* 26* 29* 32*  CREATININE 1.89* 1.92* 2.15* 2.28*  CALCIUM 9.3 8.8* 9.0 7.9*  PROT 5.8*   --   --   --   BILITOT 0.7  --   --   --   ALKPHOS 93  --   --   --   ALT 11*  --   --   --   AST 22  --   --   --   GLUCOSE 140* 119* 91 202*   C-diff: positive  Imaging/Diagnostic Tests: Dg Chest Port 1 View FINDINGS: Left PICC terminates at the cavoatrial junction. Right internal jugular central venous catheter terminates at the cavoatrial junction. Tracheostomy tube tip overlies the tracheal air column at the thoracic inlet. Stable cardiomediastinal silhouette with mild cardiomegaly. No pneumothorax. No pleural effusion. Low lung volumes. Vascular crowding without overt pulmonary edema. Mild left basilar opacity appears stable.  IMPRESSION:  1. Left PICC terminates at the cavoatrial junction.  2. Low lung volumes. Vascular crowding without overt pulmonary edema.  3. Stable mild left basilar lung opacity, favor atelectasis.  4. Stable mild cardiomegaly.    Apex Bing, DO 08/12/2016, 9:58 AM PGY-1, [place] Intern pager: [phone], text pages welcome

## 2016-08-13 LAB — GLUCOSE, CAPILLARY
GLUCOSE-CAPILLARY: 105 mg/dL — AB (ref 65–99)
GLUCOSE-CAPILLARY: 146 mg/dL — AB (ref 65–99)
GLUCOSE-CAPILLARY: 68 mg/dL (ref 65–99)
GLUCOSE-CAPILLARY: 83 mg/dL (ref 65–99)
Glucose-Capillary: 93 mg/dL (ref 65–99)
Glucose-Capillary: 118 mg/dL — ABNORMAL HIGH (ref 65–99)
Glucose-Capillary: 113 mg/dL — ABNORMAL HIGH (ref 65–99)
Glucose-Capillary: 115 mg/dL — ABNORMAL HIGH (ref 65–99)

## 2016-08-13 LAB — RENAL FUNCTION PANEL
Albumin: 1.4 g/dL — ABNORMAL LOW (ref 3.5–5.0)
Anion gap: 10 (ref 5–15)
BUN: 32 mg/dL — ABNORMAL HIGH (ref 6–20)
CALCIUM: 8.2 mg/dL — AB (ref 8.9–10.3)
CHLORIDE: 98 mmol/L — AB (ref 101–111)
CO2: 26 mmol/L (ref 22–32)
Creatinine, Ser: 2.42 mg/dL — ABNORMAL HIGH (ref 0.44–1.00)
GFR calc Af Amer: 23 mL/min — ABNORMAL LOW (ref >60)
GFR, EST NON AFRICAN AMERICAN: 20 mL/min — AB (ref >60)
GLUCOSE: 65 mg/dL (ref 65–99)
PHOSPHORUS: 3.9 mg/dL (ref 2.5–4.6)
Potassium: 3.6 mmol/L (ref 3.5–5.1)
Sodium: 134 mmol/L — ABNORMAL LOW (ref 135–145)

## 2016-08-13 LAB — CREATININE CLEARANCE, URINE, 24 HOUR
CREAT CLEAR: 10 mL/min — AB (ref 75–115)
CREATININE 24H UR: 345 mg/d — AB (ref 600–1800)
Collection Interval-CRCL: 24 hours
Creatinine, Urine: 69.04 mg/dL
Urine Total Volume-CRCL: 500 mL

## 2016-08-13 LAB — CBC
HEMATOCRIT: 23.9 % — AB (ref 36.0–46.0)
Hemoglobin: 7.7 g/dL — ABNORMAL LOW (ref 12.0–15.0)
MCH: 27.8 pg (ref 26.0–34.0)
MCHC: 32.2 g/dL (ref 30.0–36.0)
MCV: 86.3 fL (ref 78.0–100.0)
PLATELETS: 253 10*3/uL (ref 150–400)
RBC: 2.77 MIL/uL — ABNORMAL LOW (ref 3.87–5.11)
RDW: 14.3 % (ref 11.5–15.5)
WBC: 14.3 10*3/uL — AB (ref 4.0–10.5)

## 2016-08-13 MED ORDER — ALTEPLASE 2 MG IJ SOLR
2.0000 mg | Freq: Once | INTRAMUSCULAR | Status: AC
  Administered 2016-08-13: 2 mg

## 2016-08-13 MED ORDER — DARBEPOETIN ALFA 40 MCG/0.4ML IJ SOSY
40.0000 ug | PREFILLED_SYRINGE | INTRAMUSCULAR | Status: DC
  Administered 2016-08-13: 40 ug via SUBCUTANEOUS
  Filled 2016-08-13: qty 0.4

## 2016-08-13 MED ORDER — DEXTROSE 50 % IV SOLN
INTRAVENOUS | Status: AC
  Administered 2016-08-13: 25 mL
  Filled 2016-08-13: qty 50

## 2016-08-13 MED ORDER — GLYCOPYRROLATE 0.2 MG/ML IJ SOLN
0.2000 mg | INTRAMUSCULAR | Status: DC
  Administered 2016-08-13: 0.2 mg via INTRAVENOUS

## 2016-08-13 MED ORDER — FERUMOXYTOL INJECTION 510 MG/17 ML
510.0000 mg | Freq: Once | INTRAVENOUS | Status: AC
  Administered 2016-08-13: 510 mg via INTRAVENOUS
  Filled 2016-08-13 (×2): qty 17

## 2016-08-13 MED ORDER — GLYCOPYRROLATE 0.2 MG/ML IJ SOLN
0.2000 mg | INTRAMUSCULAR | Status: DC | PRN
  Administered 2016-08-16 – 2016-08-17 (×2): 0.2 mg via INTRAVENOUS
  Filled 2016-08-13 (×2): qty 1

## 2016-08-13 MED ORDER — DEXTROSE-NACL 5-0.9 % IV SOLN
INTRAVENOUS | Status: DC
  Administered 2016-08-13 – 2016-08-17 (×7): via INTRAVENOUS
  Administered 2016-08-18: 1000 mL via INTRAVENOUS
  Administered 2016-08-18: 05:00:00 via INTRAVENOUS

## 2016-08-13 NOTE — Progress Notes (Signed)
Hypoglycemic Event  CBG: 68  Treatment: D50 IV 25 mL  Symptoms: None  Follow-up CBG: Time:0529 CBG Result:105  Possible Reasons for Event: Inadequate meal intake  Comments/MD notified:FMTS notified    Levonne Hubert

## 2016-08-13 NOTE — Progress Notes (Signed)
Subjective: Sleepy, but awakened and denied pain.    Objective: Vital signs in last 24 hours: Temp:  [97.4 F (36.3 C)-98.9 F (37.2 C)] 97.6 F (36.4 C) (11/19 0659) Pulse Rate:  [79-94] 92 (11/19 0811) Resp:  [13-18] 18 (11/19 0811) BP: (98-124)/(54-102) 123/68 (11/19 0837) SpO2:  [100 %] 100 % (11/19 0811) FiO2 (%):  [28 %] 28 % (11/19 0811) Weight:  [93.2 kg (205 lb 7.5 oz)] 93.2 kg (205 lb 7.5 oz) (11/19 0424) Last BM Date: 08/12/16  Intake/Output from previous day: 11/18 0701 - 11/19 0700 In: 2201.7 [I.V.:1701.7; IV Piggyback:500] Out: 950 [Urine:300; Drains:550; Stool:100] Intake/Output this shift: Total I/O In: 100 [I.V.:100] Out: -   [place], sclera anicteric Resp:  Breathing comfortably GI: soft nt/nd bs present Around 300 mL liquid stool in flexiseal collection bag.    Lab Results:   Recent Labs  08/11/16 0356 08/13/16 0700  WBC 22.4* 14.3*  HGB 8.5* 7.7*  HCT 26.2* 23.9*  PLT 258 253   BMET  Recent Labs  08/12/16 0430 08/13/16 0434  NA 135 134*  K 3.2* 3.6  CL 97* 98*  CO2 28 26  GLUCOSE 202* 65  BUN 32* 32*  CREATININE 2.28* 2.42*  CALCIUM 7.9* 8.2*   PT/INR No results for input(s): LABPROT, INR in the last 72 hours. ABG No results for input(s): PHART, HCO3 in the last 72 hours.  Invalid input(s): PCO2, PO2  Studies/Results: Dg Abd 2 Views  Result Date: 08/12/2016 CLINICAL DATA:  Followup small bowel obstruction. EXAM: ABDOMEN - 2 VIEW COMPARISON:  08/11/2016 and prior exams FINDINGS: Distended small bowel loops are again identified with some oral contrast in the small bowel. A percutaneous gastrostomy tube is again noted. There is no evidence of pneumoperitoneum. IMPRESSION: Dilated small bowel loops again noted compatible with small bowel obstruction. No evidence of pneumoperitoneum. Electronically Signed   By: Margarette Canada M.D.   On: 08/12/2016 12:30    Anti-infectives: Anti-infectives    Start     Dose/Rate Route Frequency  Ordered Stop   08/12/16 1100  vancomycin (VANCOCIN) IVPB 1000 mg/200 mL premix  Status:  Discontinued     1,000 mg 200 mL/hr over 60 Minutes Intravenous Every 24 hours 08/11/16 1031 08/12/16 1601   08/11/16 2100  metroNIDAZOLE (FLAGYL) IVPB 500 mg     500 mg 100 mL/hr over 60 Minutes Intravenous Every 8 hours 08/11/16 1334     08/11/16 1100  vancomycin (VANCOCIN) 1,500 mg in sodium chloride 0.9 % 500 mL IVPB     1,500 mg 250 mL/hr over 120 Minutes Intravenous  Once 08/11/16 1031 08/11/16 1448   08/11/16 1100  aztreonam (AZACTAM) 1 g in dextrose 5 % 50 mL IVPB     1 g 100 mL/hr over 30 Minutes Intravenous Every 8 hours 08/11/16 1031     08/11/16 0930  metroNIDAZOLE (FLAGYL) IVPB 500 mg  Status:  Discontinued     500 mg 100 mL/hr over 60 Minutes Intravenous Every 8 hours 08/11/16 0912 08/11/16 1334   08/10/16 2200  ciprofloxacin (CIPRO) IVPB 400 mg  Status:  Discontinued     400 mg 200 mL/hr over 60 Minutes Intravenous Every 24 hours 08/10/16 2032 08/10/16 2301      Assessment/Plan: Partial SBO versus ileus.   Suspect resolution of process.  Pt not obstructed with increased stool today and stool yesterday despite contrast in SB on yesterday plain film.  Will get repeat film tomorrow.  Could also consider clamping G tube to see  if she is able to tolerate gastric secretions tomorrow.     [facility] 08/13/2016

## 2016-08-13 NOTE — Discharge Summary (Signed)
[facility] Discharge Summary  Patient name: Angelica Beck Medical record number: [id number] Date of birth: 1951-02-18 Age: 65 y.o. Gender: female Date of Admission: 08/10/2016  Date of Discharge: 08/18/2016 Admitting Physician: Blane Ohara McDiarmid, MD  Primary Care Provider: Delia Chimes, NP Consultants: Surgery, Nephro, ID  Indication for Hospitalization: Ileus/SBO  Discharge Diagnoses/Problem List:  Patient Active Problem List   Diagnosis Date Noted  . Acute on chronic respiratory failure ([place])   . Diabetes mellitus type 2 in obese ([place])   . (HFpEF) heart failure with preserved ejection fraction ([place])   . Enteritis due to Clostridium difficile   . SBO (small bowel obstruction) 08/10/2016  . Collapse of left lung   . S/P percutaneous endoscopic gastrostomy (PEG) tube placement ([place])   . Tracheostomy status ([place])   . HCAP (healthcare-associated pneumonia) 07/16/2016  . Encephalopathy   . Palliative care encounter   . Abdominal pain   . Pressure injury of skin 06/24/2016  . Encounter for central line placement   . Acute respiratory failure ([place]) 06/21/2016  . Septic shock ([place]) 06/20/2016  . Chronic respiratory failure with hypoxia ([place]) 11/29/2015  . Essential hypertension   . Bacterial UTI 08/06/2015  . Sepsis ([place]) 08/06/2015  . Altered mental status   . Lactic acidosis 08/02/2015  . Acute encephalopathy 08/02/2015  . History of CVA (cerebrovascular accident) 08/02/2015  . Chronic systolic HF (heart failure) ([place]) 08/02/2015  . Cognitive impairment 08/02/2015  . AKI (acute kidney injury) ([place]) 02/08/2015  . Abnormality of gait 02/08/2015  . Hyperthyroidism 02/08/2015  . Type 2 diabetes mellitus ([place]) 02/08/2015  . Chest pain 01/19/2014  . Obesity hypoventilation syndrome ([place]) 06/20/2013  . HYPERTHYROIDISM 06/28/2009  . DIAB W/UNS COMP TYPE II/UNS NOT STATED UNCNTRL 06/28/2009  . HYPERLIPIDEMIA-MIXED 06/28/2009  . OVERWEIGHT/OBESITY  06/28/2009  . HYPERTENSION, BENIGN 06/28/2009  . CAD, NATIVE VESSEL 06/28/2009  . DYSPNEA 06/28/2009     Disposition: [facility]  Discharge Condition: Stable  Discharge Exam:  General: Chronically-ill appearing female lies in bed, nonverbal, trach in place, alert, in no acute distress Cardiovascular: RRR, no m/r/g Respiratory: no W/R/R Abdomen: soft, nt, nd, peg in place clean and dry, +draining brown fluid with gravity Extremities: warm and well-perfused, moves all extremities equally  Brief Hospital Course:  Patient was admitted to the hospital due to small bowel obstruction/ileus.  She was managed conservatively with bowel rest and serial abdominal imaging closely followed by surgery.  G-tube was drained to gravity.  Patient gradually improved. Tube feeds were restarted on 11/21, dietetics helped with a plan for advancing feeds.   HCAP - Found to have new LLL PNA. Given recent antibiotic use from previous infections ID was consulted. Patient completed four days of aztreonam and was narrowed to ceftriaxone to complete a 5 day course of antibiotics. Respiratory cultures grew E coli sensitive to ceftriaxone.  C Diff -   Patient had been recently treated after positive test on 07/04/2016 with vancomycin PO at discharge from that admission. It was unclear when that antibiotic course ended. She was C Diff positive upon this admission. Patient underwent 4 days IV Flagyl (while NPO for SBO), and then as tube feeds were restarted she was transitioned to PO vancomycin.  Will plan to continue oral vancomycin 19m QID x 14 days (11/20 - 12/3) then plan for taper 1233mTID x 1 week , then 12580mID x 1 week, then 125m65mily x 1 week.  This plan was made by the ID specialists.  AKI, likely 2/2 ATN  Patient did have an AKI with Cr 2.15 on admission which trended down to her baseline. Nephrology was consulted due to previous intermittent dialysis which had last been done on 11/13. Nephrology followed  daily but did not see an indication for HD on this admission, at discharge felt patient was stable for discharge.   Issues for Follow Up:  1. C diff  Will plan to continue oral vancomycin 148m QID x 14 days (11/20 through 12/3) then plan for taper 123mTID x 1 week (12/4 through12/10), then 12531mID x 1 week (12/11 through 12/17), then 125m52mily x 1 week (12/18 through 12/24) 2. Electrolytes Patient required Mag and Potassium repletion every few days throughout hospitalization.  Please follow up and replete as necessary.  Significant Procedures: None  Significant Labs and Imaging:  Dg Chest Port 1 View FINDINGS: Left PICC terminates at the cavoatrial junction. Right internal jugular central venous catheter terminates at the cavoatrial junction. Tracheostomy tube tip overlies the tracheal air column at the thoracic inlet. Stable cardiomediastinal silhouette with mild cardiomegaly. No pneumothorax. No pleural effusion. Low lung volumes. Vascular crowding without overt pulmonary edema. Mild left basilar opacity appears stable.  IMPRESSION:  1. Left PICC terminates at the cavoatrial junction.  2. Low lung volumes. Vascular crowding without overt pulmonary edema.  3. Stable mild left basilar lung opacity, favor atelectasis.  4. Stable mild cardiomegaly.    Dg Abd 2 Views 08/16/2016 IMPRESSION: Since the prior plain film there has been near complete evacuation of enteric contrast, however, persist mild dilation of the bowel loops with relative absence of colonic gas suggests persisting intermittent/partial small bowel obstruction. Gastric tube is unchanged in position.    Recent Labs Lab 08/13/16 0700 08/15/16 0500 08/17/16 0630  WBC 14.3* 10.5 9.8  HGB 7.7* 8.3* 7.9*  HCT 23.9* 26.2* 25.1*  PLT 253 262 274    Recent Labs Lab 08/12/16 1200 08/13/16 0434 08/14/16 0515 08/14/16 1015 08/15/16 0500 08/15/16 0641 08/16/16 0320 08/16/16 0930 08/17/16 0630 08/18/16 1513  NA   --  134* 138  --  137  --  140  --  140  140 140  K  --  3.6 2.8*  --  3.5  --  3.2*  --  3.6  3.6 3.5  CL  --  98* 106  --  109  --  110  --  113*  113* 113*  CO2  --  26 26  --  23  --  23  --  _0 GLUCOSE  --  65 82  --  142*  --  132*  --  204*  206* 183*  BUN  --  32* 28*  --  23*  --  16  --  _1 CREATININE  --  2.42* 2.09*  --  1.95*  --  1.66*  --  1.53*  1.49* 1.29*  CALCIUM  --  8.2* 8.0*  --  8.1*  --  8.2*  --  8.3*  8.3* 8.0*  MG 1.9  --   --  1.6*  --  1.9  --  1.5* 1.6*  --   PHOS 3.9 3.9 3.5  --  2.6  --  2.8  --  2.7  --   ALBUMIN  --  1.4* 1.4*  --  1.4*  --  1.3*  --  1.3*  --       Results/Tests Pending at Time  of Discharge: None  Discharge Medications:    Medication List    STOP taking these medications   ciprofloxacin 400 MG/200ML Soln Commonly known as:  CIPRO   CLINIMIX/DEXTROSE (5/25) 5 % Soln   Darbepoetin Alfa 100 MCG/0.5ML Sosy injection Commonly known as:  ARANESP   insulin aspart 100 UNIT/ML injection Commonly known as:  novoLOG   INTRALIPID 20 % infusion Generic drug:  fat emulsion   oxyCODONE 5 MG immediate release tablet Commonly known as:  Oxy IR/ROXICODONE   rosuvastatin 40 MG tablet Commonly known as:  CRESTOR     TAKE these medications   albuterol (2.5 MG/3ML) 0.083% nebulizer solution Commonly known as:  PROVENTIL Take 3 mLs (2.5 mg total) by nebulization every 4 (four) hours as needed for wheezing or shortness of breath.   aspirin 81 MG chewable tablet Place 2 tablets (162 mg total) into feeding tube daily. What changed:  how much to take   atorvastatin 40 MG tablet Commonly known as:  LIPITOR Place 40 mg into feeding tube at bedtime as needed and may repeat dose one time if needed.   budesonide 0.5 MG/2ML nebulizer solution Commonly known as:  PULMICORT Take 0.5 mg by nebulization 2 (two) times daily.   chlorhexidine gluconate (MEDLINE KIT) 0.12 % solution Commonly known as:  PERIDEX 15 mLs by  Mouth Rinse route 2 (two) times daily.   cholecalciferol 1000 units tablet Commonly known as:  VITAMIN D 2,000 Units See admin instructions. Per tube once a day   dextrose 5 % and 0.45 % NaCl 5-0.45 % Inject into the vein See admin instructions. D5 1/2 NORMAL SALINE: Hypoglycemia protocol if patient is alert and NPO, run at 50 ml/hour AS NEEDED (Drip rate = 41.666 ml/hr)   dextrose 50 % solution Inject into the vein See admin instructions. Per sliding scale as needed for low blood glucose D50W: 25 ml < 22; 23 ml  22-28; 36m  29-35; 178m 36-41; 1778m42-48; 58m60m9-55; 13ml66m-61; 11ml 44m70; CALL MD >70   famotidine 20 MG tablet Commonly known as:  PEPCID Place 1 tablet (20 mg total) into feeding tube daily.   feeding supplement (PRO-STAT 64) Liqd Take 30 mLs by mouth 3 (three) times daily.   feeding supplement (VITAL AF 1.2 CAL) Liqd Place 1,000 mLs into feeding tube daily. _0 /hr What changed:  how to take this  additional instructions   FISH OIL PO Place 8,000 mg into feeding tube daily.   glucagon 1 MG injection Inject 1 mg into the muscle once as needed. Call physician to notify of protocol initiation   heparin 5000 UNIT/ML injection Inject 1 mL (5,000 Units total) into the skin every 8 (eight) hours.   HUMALOG 100 UNIT/ML injection Generic drug:  insulin lispro Inject 0-12 Units into the skin See admin instructions. Implement hypoglycemic protocol: BGL 70-150 = 0 units; 151-200 = 2 units; 201-250 = 4 units; 251-300 = 6 units; 301-350 = 8 units; 351-400 = 10 units; >400 = 12 units PLUS CALL DOCTOR & OBTAIN STAT LAB VERIFICATION   ipratropium-albuterol 0.5-2.5 (3) MG/3ML Soln Commonly known as:  DUONEB Take 3 mLs by nebulization 3 (three) times daily as needed.   LIQUITEARS 1.4 % ophthalmic solution Generic drug:  polyvinyl alcohol Place 2 drops into both eyes See admin instructions. EVERY SHIFT   Melatonin 3 MG Tabs Place 3 mg into feeding tube at  bedtime.   methimazole 10 MG tablet Commonly known as:  TAPAZOLE  Place 1 tablet (10 mg total) into feeding tube daily.   metoCLOPramide 5 MG tablet Commonly known as:  REGLAN Place 5 mg into feeding tube every 8 (eight) hours.   metoprolol tartrate 25 mg/10 mL Susp Commonly known as:  LOPRESSOR Place 10 mLs (25 mg total) into feeding tube 2 (two) times daily.   modafinil 100 MG tablet Commonly known as:  PROVIGIL Place 100 mg into feeding tube daily.   morphine 1 mg/mL in sodium chloride 0.9 % 250 mL Inject 0.041 mg/hr into the vein every 6 (six) hours as needed (for pain).   mouth rinse Liqd solution 15 mLs by Mouth Rinse route QID.   NEPHRO-VITE PO Place 1 tablet into feeding tube daily.   NUTRISOURCE FIBER Pack See admin instructions. One pack by mouth three times a day   ondansetron 4 MG/2ML Soln injection Commonly known as:  ZOFRAN Inject 4 mg into the vein every 6 (six) hours as needed for nausea or vomiting.   scopolamine 1 MG/3DAYS Commonly known as:  TRANSDERM-SCOP Place 1 patch onto the skin every 3 (three) days. TO EAR AREA   senna-docusate 8.6-50 MG tablet Commonly known as:  Senokot-S Place 1 tablet into feeding tube 2 (two) times daily as needed (for constipation).   sodium polystyrene 15 GM/60ML suspension Commonly known as:  KAYEXALATE Place 30 g into feeding tube as needed.   vancomycin 50 mg/mL oral solution Commonly known as:  VANCOCIN Take 2.5 mLs (125 mg total) by mouth every 6 (six) hours. What changed:  You were already taking a medication with the same name, and this prescription was added. Make sure you understand how and when to take each.   vancomycin 50 mg/mL oral solution Commonly known as:  VANCOCIN Take 2.5 mLs (125 mg total) by mouth every 8 (eight) hours. Start taking on:  08/29/2016 What changed:  how much to take  when to take this   vancomycin 50 mg/mL oral solution Commonly known as:  VANCOCIN Take 2.5 mLs (125 mg  total) by mouth every 12 (twelve) hours. Start taking on:  09/05/2016 What changed:  You were already taking a medication with the same name, and this prescription was added. Make sure you understand how and when to take each.   vancomycin 50 mg/mL oral solution Commonly known as:  VANCOCIN Take 2.5 mLs (125 mg total) by mouth daily. Start taking on:  09/12/2016 What changed:  You were already taking a medication with the same name, and this prescription was added. Make sure you understand how and when to take each.   vitamin C 500 MG tablet Commonly known as:  ASCORBIC ACID Take 500 mg by mouth daily.       Discharge Instructions: Please refer to Patient Instructions section of EMR for full details.  Patient was counseled important signs and symptoms that should prompt return to medical care, changes in medications, dietary instructions, activity restrictions, and follow up appointments.   Follow-Up Appointments:   [place] Bing, DO 08/18/2016, 4:34 PM PGY-1, [place]

## 2016-08-13 NOTE — Progress Notes (Signed)
[facility] Progress Note   Assessment/ Plan:   1.  Acute kidney injury recently on dialysis.  Last HD 11/13.  Not sure how much urine she's making.  Cr slowly uptrending but she is essentially immobile so unclear if that will be an accurate assessment of renal function.   - strict I and O  - will make assessments for need for HD - 24 hour urine creatinine clearance as HD had been held in [facility], last HD 11/13.  Think her low cr is due to low muscle mass and she might actually be ESRD although rate of cr rise is low. - attempting to avoid nephrotoxins as able  2.  Recurrent SBO: surgery following.  Gtube to drain.  small bowel obst protocol  3.  Sepsis: WBC ct, tachycardia, many potential sources --> PNA, UTI, SBO, c diff.  On vanc/ aztreonam/ flagyl.  Cultures pending.  4.  Acute, now chronic Rf: s/p trach  5.  Bone/ mineral: PTH 520 9/30, phos 3.8 11/17,  Ccac 11.1 --> will investigate Vit D analogs, may need sensipar.  Rechecking PTH  6.  Anemia: Hgb 8.5, will get % sat-->incalculable.  feraheme x 1 11/19. Aranesp 40 q week (11/19).  Subjective:    No acute events.  Undergoing 24 hour urine collection   Objective:   BP 129/74   Pulse 77   Temp 97.6 F (36.4 C) (Oral)   Resp 18   Ht '5\' 6"'$  (1.676 m)   Wt 93.2 kg (205 lb 7.5 oz)   SpO2 100%   BMI 33.16 kg/m   Physical Exam: GEN: chronically ill appearing HEENT: trach in place with copious secretions NECK: unable to appreciate JVD CHEST: permcath in place without drainage from exit site PULM: diffuse coarse rhonchi bilaterally CV tachycardic ABD soft, hypoactive bowel sounds, G tube to drain EXT anasarca NEURO nods head to simple, direct questioning Labs: BMET  Recent Labs Lab 08/07/16 0756 08/09/16 2026 08/10/16 0828 08/11/16 0356 08/12/16 0430 08/12/16 1200 08/13/16 0434  NA 139 129* 129* 130* 135  --  134*  K 3.5 3.3* 3.2* 3.5 3.2*  --  3.6  CL 101 90* 91* 90* 97*  --  98*  CO2 '29  30 29 30 28  '$ --  26  GLUCOSE 140* 140* 119* 91 202*  --  65  BUN 14 22* 26* 29* 32*  --  32*  CREATININE 1.62* 1.89* 1.92* 2.15* 2.28*  --  2.42*  CALCIUM 8.0* 9.3 8.8* 9.0 7.9*  --  8.2*  PHOS 2.7 3.3 3.6 3.8  --  3.9 3.9   CBC  Recent Labs Lab 08/07/16 0755 08/10/16 0828 08/11/16 0356 08/13/16 0700  WBC 10.5 24.9* 22.4* 14.3*  NEUTROABS  --  20.4*  --   --   HGB 7.3* 7.7* 8.5* 7.7*  HCT 22.9* 23.1* 26.2* 23.9*  MCV 89.8 87.2 86.5 86.3  PLT 230 238 258 253    '@IMGRELPRIORS'$ @ Medications:    . aztreonam  1 g Intravenous Q8H  . budesonide  0.5 mg Nebulization BID  . chlorhexidine  15 mL Mouth Rinse BID  . famotidine (PEPCID) IV  20 mg Intravenous Q24H  . feeding supplement (VITAL AF 1.2 CAL)  1,000 mL Per Tube Q24H  . glycopyrrolate  0.2 mg Subcutaneous Q4H  . heparin  5,000 Units Subcutaneous Q8H  . insulin aspart  0-9 Units Subcutaneous Q4H  . mouth rinse  15 mL Mouth Rinse q12n4p  . metoprolol  5 mg  Intravenous Q12H  . metronidazole  500 mg Intravenous Q8H  . nystatin   Topical BID  . polyvinyl alcohol  2 drop Both Eyes BID    [facility] Cell [phone] pgr [phone] 08/13/2016, 12:20 PM

## 2016-08-13 NOTE — Progress Notes (Signed)
Family Medicine Teaching Service Daily Progress Note Intern Pager: [phone]  Patient name: Angelica Beck Medical record number: [id number] Date of birth: 1950-10-14 Age: 65 y.o. Gender: female  Primary Care Provider: Delia Chimes, NP Consultants: General Surgery, ID, Nephrology Code Status: Full Chief Complaint: None  Assessment and Plan: Angelica Beck is a 65 y.o. female presenting with SBO. PMH is significant for stroke (residual mild left sided weakness, non-ambulatory, bedbound), T2DM, HTN, hyperthyroidism, HLD, CAD, HFpEF, ARF 2/2 ATN on intermittent HD, s/p trach and G-tube 06/2016.  # Small Bowel Obstruction, improving:  S/p partial SBO at last  was noted and treated conservatively, it resolved on its own.  SBO seen on CT scan at [facility] on 11/15 so patient brought into ED.  Patient without complaints of SBO (no abd pain, nausea, or vomiting).  Pt had moderate-sized stool upon arrival to the floor with hypoactive BS. Hx of ventral hernia repair, no other abdominal surgeries. Surgery to get serial abdominal xrays to monitor for improvement. - Surgery following, appreciate recs - G-tube drain to gravity - NPO and nothing per tube - Pepcid 20 mg IV qd for SUP - Monitor I/Os - If develops pain, will likely start Morphine IV  # Likely Healthcare Acquired Pneumonia, improving: Patient recently treated for HAP at last admission. CT this admission shows LLL PNA thought to be new, not just resolving from previous infection. WBC elevated at 22.4 > 14.3. Concerns for aspiration PNA given copious secretions from trach. BCx NGTD. Afebrile, VSS. - Sputum cultures pending - gram stain with few positive cocci in pairs, few yeast, moderate G+ rods - Continue day #3 Aztreonam (11/17 - ) dosed per pharm  --ID consulted, appreciate recs  # H/o C-diff Infection, Acute on Chronic, stable: Patient recently treated after positive test on 10/10 with vanc PO at discharge. Unclear when abx ended. Positive  on admission.  - Continue day #3 Flagyl IV 500 mg q8h (11/17 - ) given NPO for SBO --ID consulted, appreciate recs  # Chronic Respiratory Failure with Trach, Stable:  Trach placed 10/20 at last hospitalization when patient had chronic hypoxic respiratory failure due to encephalopathy that prevented her from protecting her airway as well as obesity hypoventilation syndrome. - previously w/scopolamine patch but d/c'd 2/2 SBO (anticholinergic effects).   - Continue trach collar and routine trach care - Albuterol neb every 4 hours prn   # Acute Kidney Injury, Likely 2/2 ATN, Worsening:  Cr continues to rise 2.15>>>2.42 (BL 1.6-1.8) Pt was on CRRT during last hospitalization, transitioned to intermittent HD, last 11/13.  - Nephrology consulted, appreciate recs - 24 Cr clearance pending - Strict I/Os - Trend BMET  # Electrolyte Abnormalities, Resolved:  Hyponatremia resolved (134), hypokalemia resolved (3.6, hypomagnesemia resolved as of 11/8, Hypomagnesemia resolved 1.9 (11/17) - Nephrology consulted, appreciate recs - Strict I/Os - Monitor and replete for Mg >2.0, K>3.5  # Anemia of Chronic Disease, Imporved:  Hgb 7.7> 8.5 > 7.7. MCV 86.3. Iron panel from 06/2016 with normal iron, low TIBC, and high ferritin.  # Diabetes Mellitus Type 2:  Last A1c 5.8%. Pt on Humalog sliding scale at home. CBG low at 68>>105 overnight, improved with D5 in fluids. --Sensitive SSI --CBGs every 4 hours (NPO)  # HFpEF:  Last ECHO 06/21/16: EF 60-65%, G1DD. Takes Metoprolol tartrate '25mg'$  bid at home. --Continue as Metoprolol '5mg'$  IV BID while on bowel rest  # Hyperthyroidism:  Pt taking Methimazole '10mg'$  daily. Last TSH 11/11 was 0.641, free T4 1.74. --Will hold methimazole for  now since patient is NPO and nothing per tube  # H/o CVA, Stable:  Pt has residual left sided weakness and is non-ambulatory. Prior to her last hospitalization, she was living with her daughter. She was completely reliant on her  family for ADLs and she was mostly bed bound. --Hold Aspirin and Lipitor while Pt is on bowel rest  FEN/GI: NPO, G-tube drain to gravity Prophylaxis: Heparin SQ  Disposition: [facility]  Subjective:  Patient lies in bed, trach and peg in place. Asleep as I entered the room. Remains non-verbal. Nods and shakes head to questions.  Denies abdominal pain. Denies pain to palpation of abdomen. Secretions more controlled this morning.   Objective: Temp:  [97.4 F (36.3 C)-98.9 F (37.2 C)] 97.6 F (36.4 C) (11/19 0659) Pulse Rate:  [79-94] 92 (11/19 0811) Resp:  [13-18] 18 (11/19 0811) BP: (98-124)/(54-102) 123/68 (11/19 0837) SpO2:  [100 %] 100 % (11/19 0811) FiO2 (%):  [28 %] 28 % (11/19 0811) Weight:  [93.2 kg (205 lb 7.5 oz)] 93.2 kg (205 lb 7.5 oz) (11/19 0424) Physical Exam: General: Chronically-ill appearing female lies in bed, nonverbal, trach in place, sleeping but easily arousable Cardiovascular: RRR, no m/r/g Respiratory: no W/R/R Abdomen: soft and nontender, nondistended, peg in place clean and dry, draining brown fluid with gravity Extremities: warm and well-perfused, moves all extremities equally  Laboratory:  Recent Labs Lab 08/10/16 0828 08/11/16 0356 08/13/16 0700  WBC 24.9* 22.4* 14.3*  HGB 7.7* 8.5* 7.7*  HCT 23.1* 26.2* 23.9*  PLT 238 258 253    Recent Labs Lab 08/09/16 2026  08/11/16 0356 08/12/16 0430 08/13/16 0434  NA 129*  < > 130* 135 134*  K 3.3*  < > 3.5 3.2* 3.6  CL 90*  < > 90* 97* 98*  CO2 30  < > '30 28 26  '$ BUN 22*  < > 29* 32* 32*  CREATININE 1.89*  < > 2.15* 2.28* 2.42*  CALCIUM 9.3  < > 9.0 7.9* 8.2*  PROT 5.8*  --   --   --   --   BILITOT 0.7  --   --   --   --   ALKPHOS 93  --   --   --   --   ALT 11*  --   --   --   --   AST 22  --   --   --   --   GLUCOSE 140*  < > 91 202* 65  < > = values in this interval not displayed. C-diff: positive  Imaging/Diagnostic Tests: Dg Chest Port 1 View FINDINGS: Left PICC terminates at the  cavoatrial junction. Right internal jugular central venous catheter terminates at the cavoatrial junction. Tracheostomy tube tip overlies the tracheal air column at the thoracic inlet. Stable cardiomediastinal silhouette with mild cardiomegaly. No pneumothorax. No pleural effusion. Low lung volumes. Vascular crowding without overt pulmonary edema. Mild left basilar opacity appears stable.  IMPRESSION:  1. Left PICC terminates at the cavoatrial junction.  2. Low lung volumes. Vascular crowding without overt pulmonary edema.  3. Stable mild left basilar lung opacity, favor atelectasis.  4. Stable mild cardiomegaly.    Everrett Coombe, MD 08/13/2016, 9:56 AM PGY-1, [place] Intern pager: [phone], text pages welcome

## 2016-08-13 NOTE — Progress Notes (Signed)
No blood return from both lumens on PICC. IV team consult ordered and phlebotomist collected blood.

## 2016-08-14 DIAGNOSIS — A0472 Enterocolitis due to Clostridium difficile, not specified as recurrent: Secondary | ICD-10-CM

## 2016-08-14 LAB — CULTURE, RESPIRATORY W GRAM STAIN

## 2016-08-14 LAB — CULTURE, RESPIRATORY

## 2016-08-14 LAB — GLUCOSE, CAPILLARY
GLUCOSE-CAPILLARY: 83 mg/dL (ref 65–99)
GLUCOSE-CAPILLARY: 107 mg/dL — AB (ref 65–99)
GLUCOSE-CAPILLARY: 154 mg/dL — AB (ref 65–99)
GLUCOSE-CAPILLARY: 161 mg/dL — AB (ref 65–99)
Glucose-Capillary: 96 mg/dL (ref 65–99)

## 2016-08-14 LAB — RENAL FUNCTION PANEL
Albumin: 1.4 g/dL — ABNORMAL LOW (ref 3.5–5.0)
Anion gap: 6 (ref 5–15)
BUN: 28 mg/dL — AB (ref 6–20)
CALCIUM: 8 mg/dL — AB (ref 8.9–10.3)
CHLORIDE: 106 mmol/L (ref 101–111)
CO2: 26 mmol/L (ref 22–32)
CREATININE: 2.09 mg/dL — AB (ref 0.44–1.00)
GFR, EST AFRICAN AMERICAN: 28 mL/min — AB (ref >60)
GFR, EST NON AFRICAN AMERICAN: 24 mL/min — AB (ref >60)
Glucose, Bld: 82 mg/dL (ref 65–99)
Phosphorus: 3.5 mg/dL (ref 2.5–4.6)
Potassium: 2.8 mmol/L — ABNORMAL LOW (ref 3.5–5.1)
SODIUM: 138 mmol/L (ref 135–145)

## 2016-08-14 LAB — MAGNESIUM: Magnesium: 1.6 mg/dL — ABNORMAL LOW (ref 1.7–2.4)

## 2016-08-14 LAB — PARATHYROID HORMONE, INTACT (NO CA): PTH: 12 pg/mL — ABNORMAL LOW (ref 15–65)

## 2016-08-14 LAB — HEMOGLOBIN A1C
HEMOGLOBIN A1C: 5.4 % (ref 4.8–5.6)
Mean Plasma Glucose: 108 mg/dL

## 2016-08-14 MED ORDER — POTASSIUM CHLORIDE CRYS ER 20 MEQ PO TBCR: 40.0000 meq | EXTENDED_RELEASE_TABLET | Freq: Two times a day (BID) | ORAL | Status: DC

## 2016-08-14 MED ORDER — DEXTROSE 5 % IV SOLN
2.0000 g | INTRAVENOUS | Status: DC
  Administered 2016-08-14: 2 g via INTRAVENOUS
  Filled 2016-08-14: qty 2

## 2016-08-14 MED ORDER — MAGNESIUM SULFATE IN D5W 1-5 GM/100ML-% IV SOLN
1.0000 g | Freq: Once | INTRAVENOUS | Status: AC
  Administered 2016-08-14: 1 g via INTRAVENOUS
  Filled 2016-08-14: qty 100

## 2016-08-14 MED ORDER — POTASSIUM CHLORIDE 20 MEQ/15ML (10%) PO SOLN
40.0000 meq | Freq: Two times a day (BID) | ORAL | Status: AC
  Administered 2016-08-14 (×2): 40 meq
  Filled 2016-08-14 (×2): qty 30

## 2016-08-14 MED ORDER — MAGNESIUM SULFATE 50 % IJ SOLN
1.0000 g | Freq: Once | INTRAMUSCULAR | Status: DC
  Filled 2016-08-14: qty 2

## 2016-08-14 MED ORDER — VANCOMYCIN 50 MG/ML ORAL SOLUTION
125.0000 mg | Freq: Four times a day (QID) | ORAL | Status: DC
  Administered 2016-08-14 – 2016-08-15 (×3): 125 mg via ORAL
  Filled 2016-08-14 (×4): qty 2.5

## 2016-08-14 NOTE — Progress Notes (Signed)
  Subjective: PT with NAE No n/v +BM  Objective: Vital signs in last 24 hours: Temp:  [97 F (36.1 C)-98.1 F (36.7 C)] 97.5 F (36.4 C) (11/20 0411) Pulse Rate:  [74-105] 105 (11/20 0900) Resp:  [14-100] 16 (11/20 0900) BP: (94-136)/(58-85) 94/58 (11/20 0900) SpO2:  [98 %-100 %] 100 % (11/20 0900) FiO2 (%):  [28 %] 28 % (11/20 0900) Weight:  [97.4 kg (214 lb 11.7 oz)] 97.4 kg (214 lb 11.7 oz) (11/20 0500) Last BM Date:  (flexiseal)  Intake/Output from previous day: 11/19 0701 - 11/20 0700 In: 2900 [I.V.:2300; IV Piggyback:600] Out: 900 [Urine:550; Drains:350] Intake/Output this shift: Total I/O In: 200 [I.V.:200] Out: -   General appearance: alert and cooperative GI: soft, non-tender; bowel sounds normal; no masses,  no organomegaly  Lab Results:   Recent Labs  08/13/16 0700  WBC 14.3*  HGB 7.7*  HCT 23.9*  PLT 253   BMET  Recent Labs  08/13/16 0434 08/14/16 0515  NA 134* 138  K 3.6 2.8*  CL 98* 106  CO2 26 26  GLUCOSE 65 82  BUN 32* 28*  CREATININE 2.42* 2.09*  CALCIUM 8.2* 8.0*   PT/INR No results for input(s): LABPROT, INR in the last 72 hours. ABG No results for input(s): PHART, HCO3 in the last 72 hours.  Invalid input(s): PCO2, PO2  Studies/Results: Dg Abd 2 Views  Result Date: 08/12/2016 CLINICAL DATA:  Followup small bowel obstruction. EXAM: ABDOMEN - 2 VIEW COMPARISON:  08/11/2016 and prior exams FINDINGS: Distended small bowel loops are again identified with some oral contrast in the small bowel. A percutaneous gastrostomy tube is again noted. There is no evidence of pneumoperitoneum. IMPRESSION: Dilated small bowel loops again noted compatible with small bowel obstruction. No evidence of pneumoperitoneum. Electronically Signed   By: Margarette Canada M.D.   On: 08/12/2016 12:30    Anti-infectives: Anti-infectives    Start     Dose/Rate Route Frequency Ordered Stop   08/12/16 1100  vancomycin (VANCOCIN) IVPB 1000 mg/200 mL premix   Status:  Discontinued     1,000 mg 200 mL/hr over 60 Minutes Intravenous Every 24 hours 08/11/16 1031 08/12/16 1601   08/11/16 2100  metroNIDAZOLE (FLAGYL) IVPB 500 mg     500 mg 100 mL/hr over 60 Minutes Intravenous Every 8 hours 08/11/16 1334     08/11/16 1100  vancomycin (VANCOCIN) 1,500 mg in sodium chloride 0.9 % 500 mL IVPB     1,500 mg 250 mL/hr over 120 Minutes Intravenous  Once 08/11/16 1031 08/11/16 1448   08/11/16 1100  aztreonam (AZACTAM) 1 g in dextrose 5 % 50 mL IVPB     1 g 100 mL/hr over 30 Minutes Intravenous Every 8 hours 08/11/16 1031     08/11/16 0930  metroNIDAZOLE (FLAGYL) IVPB 500 mg  Status:  Discontinued     500 mg 100 mL/hr over 60 Minutes Intravenous Every 8 hours 08/11/16 0912 08/11/16 1334   08/10/16 2200  ciprofloxacin (CIPRO) IVPB 400 mg  Status:  Discontinued     400 mg 200 mL/hr over 60 Minutes Intravenous Every 24 hours 08/10/16 2032 08/10/16 2301      Assessment/Plan: Resolved pSBO versus ileus. Suspect resolution of process.  Pt having bowel function Would start on diet and adv as tol Call back with questions   LOS: 4 days    Rosario Jacks., [facility] 08/14/2016

## 2016-08-14 NOTE — Progress Notes (Signed)
Pharmacy Antibiotic Note  Angelica Beck is a 65 y.o. female admitted on 08/10/2016 with pneumonia and recurrent cdif.   Patient with recent AKI on HD - last session [facility] on 11/13. Nephrology following  Patient with concern for SBO, but this has resolved per surgery note  Plan: Continue Aztreonam 1g IV q8h Currently on metronidazole 500 mg IV q8h - consider d/c and change to vancomycin as preferred Cdif therapy with SBO resolved  Height: '5\' 6"'$  (167.6 cm) Weight: 214 lb 11.7 oz (97.4 kg) IBW/kg (Calculated) : 59.3  Temp (24hrs), Avg:97.7 F (36.5 C), Min:97 F (36.1 C), Max:98.1 F (36.7 C)   Recent Labs Lab 08/10/16 0828 08/11/16 0356 08/12/16 0430 08/13/16 0434 08/13/16 0700 08/14/16 0515  WBC 24.9* 22.4*  --   --  14.3*  --   CREATININE 1.92* 2.15* 2.28* 2.42*  --  2.09*    Estimated Creatinine Clearance: 32.4 mL/min (by C-G formula based on SCr of 2.09 mg/dL (H)).    Allergies  Allergen Reactions  . Penicillins Swelling    FACIAL SWELLING Has patient had a PCN reaction causing immediate rash, facial/tongue/throat swelling, SOB or lightheadedness with hypotension: Yes Has patient had a PCN reaction causing severe rash involving mucus membranes or skin necrosis: No Has patient had a PCN reaction that required hospitalization: No Has patient had a PCN reaction occurring within the last 10 years: No If all of the above answers are "NO", then may proceed with Cephalosporin use.   . Lactose Intolerance (Gi) Diarrhea and Nausea And Vomiting   Levester Fresh, PharmD, BCPS, [facility] Clinical Pharmacist Phone [phone] 08/14/2016 8:59 AM

## 2016-08-14 NOTE — Progress Notes (Signed)
Family Medicine Teaching Service Daily Progress Note Intern Pager: [phone]  Patient name: Angelica Beck Medical record number: [id number] Date of birth: 1951/04/12 Age: 65 y.o. Gender: female  Primary Care Provider: Delia Chimes, NP Consultants: General Surgery, ID, Nephrology Code Status: Full Chief Complaint: None  Assessment and Plan: Angelica Beck is a 65 y.o. female presenting with SBO. PMH is significant for stroke (residual mild left sided weakness, non-ambulatory, bedbound), T2DM, HTN, hyperthyroidism, HLD, CAD, HFpEF, ARF 2/2 ATN on intermittent HD, s/p trach and G-tube 06/2016.  # Small Bowel Obstruction v ileus, resolved:   Treated conservatively, seems to have resolved on its own.  Surgery getting serial abdominal xrays to monitor for improvement. - Surgery following, appreciate recs - considering clamping G tube to see if able to tolerate gastric secretions - G-tube drain to gravity - restart tube feeds and advance as tolerated - Pepcid 20 mg IV qd for SUP - Monitor I/Os  # Likely Healthcare Acquired Pneumonia, improving: Patient recently treated for HAP at last admission. CT this admission shows LLL PNA thought to be new, not just resolving from previous infection. WBC elevated at 22.4 >>> 14.3. Concerns for aspiration PNA given copious secretions from trach. BCx NGTD. Afebrile, VSS. - Sputum cultures pending - gram stain with few positive cocci in pairs, few yeast, moderate G+ rods - Continue day #4 Aztreonam (11/17 - ) dosed per pharm  - blood cultures NGx2D - ID consulted, appreciate recs  # H/o C-diff Infection, Acute on Chronic, stable: Patient recently treated after positive test on 10/10 with vanc PO at discharge. Unclear when abx ended. Positive on admission.  - Continue day #4 Flagyl IV 500 mg q8h (11/17 - ) given NPO for SBO - ID consulted, appreciate recs  # Chronic Respiratory Failure with Trach, Stable:  Trach placed 10/20 at last hospitalization when  patient had chronic hypoxic respiratory failure due to encephalopathy that prevented her from protecting her airway as well as obesity hypoventilation syndrome. - previously w/scopolamine patch but d/c'd 2/2 SBO (anticholinergic effects).   - Continue trach collar and routine trach care - Albuterol neb every 4 hours prn   # Acute Kidney Injury, Likely 2/2 ATN, Worsening:  Cr now trends down  2.15>2.42>2.09 (BL 1.6-1.8) Pt was on CRRT during last hospitalization, transitioned to intermittent HD, last 11/13.  FeNa 0.6% suggestive of prerenal etiology for AKI. - 24 Cr clearance low at 10 [ref 75-115] - Nephrology consulted, appreciate recs - monitoring for need for HD - Strict I/Os - Trend BMET  # Electrolyte Abnormalities, Resolved:  Hyponatremia resolved (138), hypokalemia on admission, now 2.8 from 3.6 yesterday. hypomagnesemia resolved as of 11/8, Hypomagnesemia resolved 1.9 (11/17), will recheck today - Nephrology consulted, appreciate recs - will replete with 40 mg kdur x2 and follow up - mag low 1.6 will replete 1g IV mag and f/u - Strict I/Os   # Anemia of Chronic Disease, Improved:  Hgb 7.7> 8.5 > 7.7. MCV 86.3. Iron panel from 06/2016 with normal iron, low TIBC, and high ferritin.  # Diabetes Mellitus Type 2:  Last A1c 5.8%. Pt on Humalog sliding scale at home. CBG 83 overnight. - Sensitive SSI - CBGs every 4 hours (NPO)  # HFpEF:  Last ECHO 06/21/16: EF 60-65%, G1DD. Takes Metoprolol tartrate '25mg'$  bid at home. - Continue as Metoprolol '5mg'$  IV BID while on bowel rest  # Hyperthyroidism:  Pt taking Methimazole '10mg'$  daily. Last TSH 11/11 was 0.641, free T4 1.74. - Will hold methimazole for now since  patient is NPO and nothing per tube  # H/o CVA, Stable:  Pt has residual left sided weakness and is non-ambulatory. Prior to her last hospitalization, she was living with her daughter. She was completely reliant on her family for ADLs and she was mostly bed bound. - Hold Aspirin  and Lipitor while Pt is on bowel rest  FEN/GI: NPO, G-tube drain to gravity, D5NS at 100 ml/hr Prophylaxis: Heparin SQ  Disposition: [facility]  Subjective:  Patient lies in bed, trach and peg in place. Alert, responds yes or no but nonverbal.  Denies abdominal pain, denies N/V/D although continues to have soft stool draining from Gtube. Denies pain to palpation of abdomen. Secretions much improved.  Objective: Temp:  [97 F (36.1 C)-98.1 F (36.7 C)] 97.5 F (36.4 C) (11/20 0411) Pulse Rate:  [74-105] 105 (11/20 0900) Resp:  [14-100] 16 (11/20 0900) BP: (94-136)/(58-85) 94/58 (11/20 0900) SpO2:  [98 %-100 %] 100 % (11/20 0900) FiO2 (%):  [28 %] 28 % (11/20 0900) Weight:  [97.4 kg (214 lb 11.7 oz)] 97.4 kg (214 lb 11.7 oz) (11/20 0500) Physical Exam: General: Chronically-ill appearing female lies in bed, nonverbal, trach in place, sleeping but easily arousable Cardiovascular: RRR, no m/r/g Respiratory: no W/R/R Abdomen: soft and nontender, nondistended, peg in place clean and dry, draining brown fluid with gravity Extremities: warm and well-perfused, moves all extremities equally  Laboratory:  Recent Labs Lab 08/10/16 0828 08/11/16 0356 08/13/16 0700  WBC 24.9* 22.4* 14.3*  HGB 7.7* 8.5* 7.7*  HCT 23.1* 26.2* 23.9*  PLT 238 258 253    Recent Labs Lab 08/09/16 2026  08/12/16 0430 08/13/16 0434 08/14/16 0515  NA 129*  < > 135 134* 138  K 3.3*  < > 3.2* 3.6 2.8*  CL 90*  < > 97* 98* 106  CO2 30  < > '28 26 26  '$ BUN 22*  < > 32* 32* 28*  CREATININE 1.89*  < > 2.28* 2.42* 2.09*  CALCIUM 9.3  < > 7.9* 8.2* 8.0*  PROT 5.8*  --   --   --   --   BILITOT 0.7  --   --   --   --   ALKPHOS 93  --   --   --   --   ALT 11*  --   --   --   --   AST 22  --   --   --   --   GLUCOSE 140*  < > 202* 65 82  < > = values in this interval not displayed. C-diff: positive  Imaging/Diagnostic Tests: Dg Chest Port 1 View FINDINGS: Left PICC terminates at the cavoatrial junction. Right  internal jugular central venous catheter terminates at the cavoatrial junction. Tracheostomy tube tip overlies the tracheal air column at the thoracic inlet. Stable cardiomediastinal silhouette with mild cardiomegaly. No pneumothorax. No pleural effusion. Low lung volumes. Vascular crowding without overt pulmonary edema. Mild left basilar opacity appears stable.  IMPRESSION:  1. Left PICC terminates at the cavoatrial junction.  2. Low lung volumes. Vascular crowding without overt pulmonary edema.  3. Stable mild left basilar lung opacity, favor atelectasis.  4. Stable mild cardiomegaly.    Everrett Coombe, MD 08/14/2016, 9:33 AM PGY-1, [place] Intern pager: [phone], text pages welcome

## 2016-08-14 NOTE — Progress Notes (Signed)
   Since SBO is resolving, we will discontinue IV metronidazole and change to oral vanco '125mg'$  QID x 14 days then plan for taper '125mg'$  TID x 1 week , then '125mg'$  BID x 1 week, then '125mg'$  daily x 1 week.  Finish a 5 day course of aztreonam  Breyden Jeudy [facility] [place] [phone]

## 2016-08-14 NOTE — Care Management Note (Signed)
Case Management Note  Patient Details  Name: Angelica Beck MRN: [id number] Date of Birth: 1951-05-09  Subjective/Objective:     Small bowel obstruction, HCAP,  Cdiff, chronic resp failure with trach              Action/Plan: Discharge Planning: Chart reviewed. Pt came from [facility]. Scheduled dc back to LTAC. Will continue to follow for dc needs.  NCM spoke to [facility] Liaison with possible dc back to LTAC in 1-2 days.    Expected Discharge Date:               Expected Discharge Plan:  Long Term Acute Care (LTAC)  In-House Referral:  NA  Discharge planning Services  CM Consult  Post Acute Care Choice:  NA Choice offered to:  NA  DME Arranged:  N/A DME Agency:  NA  HH Arranged:  NA HH Agency:  NA  Status of Service:  In process, will continue to follow  If discussed at Long Length of Stay Meetings, dates discussed:    Additional Comments:  Erenest Rasher, RN 08/14/2016, 2:05 PM

## 2016-08-14 NOTE — Progress Notes (Signed)
Assessment/ Plan:   1. Acute kidney injury recently on dialysis. Last HD 11/13. Making some urine, will monitor Uop.  - strict I and O  - will make daily assessments for need for HD 2. Recurrent SBO: per surgery  3. Sepsis: WBC ct, tachycardia, many potential sources -->PNA, UTI, SBO, c diff.  On vanc/ aztreonam. Cultures pending. 4. Acute, now chronic Rf: s/p trach 5. Bone/ mineral: PTH 520 & repeat 12   Subjective: Interval History: Making urine  Objective: Vital signs in last 24 hours: Temp:  [97 F (36.1 C)-98.3 F (36.8 C)] 98.3 F (36.8 C) (11/20 1300) Pulse Rate:  [74-110] 110 (11/20 1004) Resp:  [14-100] 15 (11/20 1004) BP: (94-145)/(58-85) 145/66 (11/20 1300) SpO2:  [98 %-100 %] 100 % (11/20 1004) FiO2 (%):  [28 %] 28 % (11/20 0900) Weight:  [97.4 kg (214 lb 11.7 oz)] 97.4 kg (214 lb 11.7 oz) (11/20 0500) Weight change: 4.2 kg (9 lb 4.2 oz)  Intake/Output from previous day: 11/19 0701 - 11/20 0700 In: 2900 [I.V.:2300; IV Piggyback:600] Out: 900 [Urine:550; Drains:350] Intake/Output this shift: Total I/O In: 833.3 [I.V.:783.3; IV Piggyback:50] Out: -   Head: Normocephalic, without obvious abnormality, atraumatic GI: soft, non-tender; bowel sounds normal; no masses,  no organomegaly Extremities: edema 1+  trach  Lab Results:  Recent Labs  08/13/16 0700  WBC 14.3*  HGB 7.7*  HCT 23.9*  PLT 253   BMET:  Recent Labs  08/13/16 0434 08/14/16 0515  NA 134* 138  K 3.6 2.8*  CL 98* 106  CO2 26 26  GLUCOSE 65 82  BUN 32* 28*  CREATININE 2.42* 2.09*  CALCIUM 8.2* 8.0*   No results for input(s): PTH in the last 72 hours. Iron Studies:  Recent Labs  08/11/16 2300  IRON 17*  TIBC NOT CALCULATED   Studies/Results: No results found.  Scheduled: . aztreonam  1 g Intravenous Q8H  . budesonide  0.5 mg Nebulization BID  . chlorhexidine  15 mL Mouth Rinse BID  . darbepoetin (ARANESP) injection - NON-DIALYSIS  40 mcg Subcutaneous Q Sun-1800   . famotidine (PEPCID) IV  20 mg Intravenous Q24H  . feeding supplement (VITAL AF 1.2 CAL)  1,000 mL Per Tube Q24H  . heparin  5,000 Units Subcutaneous Q8H  . insulin aspart  0-9 Units Subcutaneous Q4H  . mouth rinse  15 mL Mouth Rinse q12n4p  . metoprolol  5 mg Intravenous Q12H  . metronidazole  500 mg Intravenous Q8H  . nystatin   Topical BID  . polyvinyl alcohol  2 drop Both Eyes BID  . potassium chloride  40 mEq Per Tube BID     LOS: 4 days   Catharine Kettlewell C 08/14/2016,2:09 PM

## 2016-08-15 LAB — GLUCOSE, CAPILLARY
GLUCOSE-CAPILLARY: 142 mg/dL — AB (ref 65–99)
GLUCOSE-CAPILLARY: 131 mg/dL — AB (ref 65–99)
GLUCOSE-CAPILLARY: 133 mg/dL — AB (ref 65–99)
GLUCOSE-CAPILLARY: 108 mg/dL — AB (ref 65–99)
Glucose-Capillary: 135 mg/dL — ABNORMAL HIGH (ref 65–99)
Glucose-Capillary: 138 mg/dL — ABNORMAL HIGH (ref 65–99)
Glucose-Capillary: 144 mg/dL — ABNORMAL HIGH (ref 65–99)

## 2016-08-15 LAB — CBC
HCT: 26.2 % — ABNORMAL LOW (ref 36.0–46.0)
Hemoglobin: 8.3 g/dL — ABNORMAL LOW (ref 12.0–15.0)
MCH: 27.3 pg (ref 26.0–34.0)
MCHC: 31.7 g/dL (ref 30.0–36.0)
MCV: 86.2 fL (ref 78.0–100.0)
PLATELETS: 262 10*3/uL (ref 150–400)
RBC: 3.04 MIL/uL — AB (ref 3.87–5.11)
RDW: 14.7 % (ref 11.5–15.5)
WBC: 10.5 10*3/uL (ref 4.0–10.5)

## 2016-08-15 LAB — RENAL FUNCTION PANEL
Albumin: 1.4 g/dL — ABNORMAL LOW (ref 3.5–5.0)
Anion gap: 5 (ref 5–15)
BUN: 23 mg/dL — AB (ref 6–20)
CALCIUM: 8.1 mg/dL — AB (ref 8.9–10.3)
CHLORIDE: 109 mmol/L (ref 101–111)
CO2: 23 mmol/L (ref 22–32)
CREATININE: 1.95 mg/dL — AB (ref 0.44–1.00)
GFR, EST AFRICAN AMERICAN: 30 mL/min — AB (ref >60)
GFR, EST NON AFRICAN AMERICAN: 26 mL/min — AB (ref >60)
Glucose, Bld: 142 mg/dL — ABNORMAL HIGH (ref 65–99)
Phosphorus: 2.6 mg/dL (ref 2.5–4.6)
Potassium: 3.5 mmol/L (ref 3.5–5.1)
SODIUM: 137 mmol/L (ref 135–145)

## 2016-08-15 LAB — MAGNESIUM: Magnesium: 1.9 mg/dL (ref 1.7–2.4)

## 2016-08-15 LAB — PARATHYROID HORMONE, INTACT (NO CA): PTH: 33 pg/mL (ref 15–65)

## 2016-08-15 MED ORDER — VANCOMYCIN 50 MG/ML ORAL SOLUTION: 125.0000 mg | Freq: Every day | ORAL | Status: DC

## 2016-08-15 MED ORDER — METOPROLOL TARTRATE 25 MG/10 ML ORAL SUSPENSION
12.5000 mg | Freq: Two times a day (BID) | ORAL | Status: DC
  Administered 2016-08-15 – 2016-08-18 (×7): 12.5 mg
  Filled 2016-08-15 (×8): qty 10

## 2016-08-15 MED ORDER — ATORVASTATIN CALCIUM 40 MG PO TABS
40.0000 mg | ORAL_TABLET | Freq: Every day | ORAL | Status: DC
  Administered 2016-08-15 – 2016-08-17 (×3): 40 mg
  Filled 2016-08-15 (×3): qty 1

## 2016-08-15 MED ORDER — VANCOMYCIN 50 MG/ML ORAL SOLUTION
125.0000 mg | Freq: Four times a day (QID) | ORAL | Status: DC
  Administered 2016-08-15 – 2016-08-18 (×14): 125 mg via ORAL
  Filled 2016-08-15 (×16): qty 2.5

## 2016-08-15 MED ORDER — DEXTROSE 5 % IV SOLN
2.0000 g | INTRAVENOUS | Status: AC
  Administered 2016-08-15: 2 g via INTRAVENOUS
  Filled 2016-08-15: qty 2

## 2016-08-15 MED ORDER — VANCOMYCIN 50 MG/ML ORAL SOLUTION: 125.0000 mg | Freq: Two times a day (BID) | ORAL | Status: DC

## 2016-08-15 MED ORDER — METHIMAZOLE 10 MG PO TABS
10.0000 mg | ORAL_TABLET | Freq: Every day | ORAL | Status: DC
  Administered 2016-08-15 – 2016-08-18 (×4): 10 mg
  Filled 2016-08-15 (×4): qty 1

## 2016-08-15 MED ORDER — TRAMADOL HCL 50 MG PO TABS
50.0000 mg | ORAL_TABLET | Freq: Two times a day (BID) | ORAL | Status: DC
  Administered 2016-08-15 – 2016-08-18 (×7): 50 mg
  Filled 2016-08-15 (×7): qty 1

## 2016-08-15 MED ORDER — ASPIRIN 81 MG PO CHEW
81.0000 mg | CHEWABLE_TABLET | Freq: Every day | ORAL | Status: DC
  Administered 2016-08-15 – 2016-08-18 (×4): 81 mg
  Filled 2016-08-15 (×4): qty 1

## 2016-08-15 MED ORDER — VANCOMYCIN 50 MG/ML ORAL SOLUTION: 125.0000 mg | Freq: Three times a day (TID) | ORAL | Status: DC

## 2016-08-15 MED ORDER — OXYCODONE HCL 5 MG PO TABS: 5.0000 mg | ORAL_TABLET | Freq: Three times a day (TID) | ORAL | Status: DC

## 2016-08-15 NOTE — Progress Notes (Signed)
Family Medicine Teaching Service Daily Progress Note Intern Pager: [phone]  Patient name: Angelica Beck Medical record number: [id number] Date of birth: 1951/01/28 Age: 65 y.o. Gender: female  Primary Care Provider: Delia Chimes, NP Consultants: General Surgery, ID, Nephrology Code Status: Full Chief Complaint: None  Assessment and Plan: Angelica Beck is a 65 y.o. female presenting with SBO. PMH is significant for stroke (residual mild left sided weakness, non-ambulatory, bedbound), T2DM, HTN, hyperthyroidism, HLD, CAD, HFpEF, ARF 2/2 ATN on intermittent HD, s/p trach and G-tube 06/2016.  # Agitation, new - Pt appears uncomfortable on exam today, becomes tachycardic to 110 when resident listening to her heart, tries to push resident away.  Per nursing, refusing being turned.  Shakes head "no" to having pain, but unclear whether this is reliable (pt nonverbal). Differential below. - acute delirium 2/2 hospitalization seems most likely cause - if patient becomes acutely agitated plan to evaluate and consider ativan - pain considered, patient on oxycodone 63m Q8h at home, possibly withdrawing/in pain - held due to ileus/SBO. Will start tramadol 50 mg q12h (cr clearance 34, q12h dosing recommended for crcl<30) - SBO/ileus recurrence considered, however patient on very low volume feeds at this point, no emesis, continued diarrhea, however will f/u surgery recs -  Infection also considered but not likely as cause for agitation - antibiotic mangement as below  # Small Bowel Obstruction v ileus, resolved:   Treated conservatively, seems to have resolved on its own.  Surgery following. - G-tube drain to gravity - restarted tube feeds (11/21), adv as tol, dietetics consulted - Pepcid 20 mg IV qd for SUP - Monitor I/Os  # Healthcare Acquired Pneumonia, improving: Patient recently treated for HAP at last admission. CT this admission shows LLL PNA thought to be new, not just resolving from previous  infection. Leukocytosis resolved. Concerns for aspiration PNA given copious secretions from trach. BCx NGTD. Afebrile, tachy and hypertensive o/n, adding back home meds as below now tol tube feeds. - Sputum cultures pending - gram stain with few positive cocci in pairs, few yeast, moderate G+ rods - Completed 4/5 days Aztreonam (11/17 - 11/20) and with cultures sensitivities, transitioned to ceftriaxone yesterday.  - Day #5/5 antibiotics today   - blood cultures NGx3D - appreciate ID recs  # H/o C-diff Infection, Acute on Chronic, stable: Patient recently treated after positive test on 10/10 with vanc PO at discharge. Unclear when abx ended. Positive on admission.  - S/p 4 days Flagyl IV 500 mg q8h (11/17 - 11/20) given NPO for SBO - Now restarted tube feeds: oral vanco 126mQID x 14 days (11/20 - ) then plan for taper 12575mID x 1 week , then 125m40mD x 1 week, then 125mg18mly x 1 week. - appreciate ID recs  # Chronic Respiratory Failure with Trach, Stable:  Trach placed 10/20 at last hospitalization when patient had chronic hypoxic respiratory failure due to encephalopathy that prevented her from protecting her airway as well as obesity hypoventilation syndrome. - Rubinol helping with secretions   - Continue trach collar and routine trach care - Albuterol neb every 4 hours prn   # Acute Kidney Injury, Likely 2/2 ATN, Worsening:  Cr now trends down  2.15>>>1.95 (BL 1.6-1.8) Pt was on CRRT during last hospitalization, transitioned to intermittent HD, last 11/13.  FeNa 0.6% suggestive of prerenal etiology for AKI. - 24 Cr clearance low at 10 [ref 75-115] - Nephrology consulted, appreciate recs - monitoring for need for HD daily - Strict I/Os -  Trend BMET  # Electrolyte Abnormalities, Resolved:  Hyponatremia resolved (138), hypokalemia on admission, 2.8 >> 3.5 s/p repletion yesterday. hypomagnesemia resolved as of 11/8, Hypomagnesemia 1.6 (11/20), repleted yesterday - Nephrology consulted,  appreciate recs - Mag improved to 1.9 - Strict I/Os   # Anemia of Chronic Disease, Improved: Hgb stable. Iron panel from 06/2016 with normal iron, low TIBC, and high ferritin.  # Diabetes Mellitus Type 2:  Last A1c 5.8%. Pt on Humalog sliding scale at home. CBG 135-161 overnight. - Sensitive SSI - will space q4 CBGs now tol tube feeds - can consider transitioning back to home med  # HFpEF: Last ECHO 06/21/16: EF 60-65%, G1DD. Takes Metoprolol tartrate 70m bid at home. Tachy to 110 o/n, hypertensive 167/93 this AM - Continue as Metoprolol 54mIV BID while on bowel rest - restarted metoprolol at reduced dose 12.5 mg BID for now  # Hyperthyroidism: Pt taking Methimazole 1023maily. Last TSH 11/11 was 0.641, free T4 1.74. - Methimazole held while NPO, will now restart since tube feeds are resumed (11/21)  # H/o CVA, Stable: Pt has residual left sided weakness and is non-ambulatory. Prior to her last hospitalization, she was living with her daughter. She was completely reliant on her family for ADLs and she was mostly bed bound. - Restarting Aspirin and Lipitor now that patient no longer NPO (11/21)  FEN/GI: NPO, G-tube drain to gravity, D5NS at 100 ml/hr Prophylaxis: Heparin SQ  Disposition: [facility]  Subjective:  Pt appears uncomfortable on exam today, becomes tachycardic to 110 when resident listening to her heart, tries to push met away when I examine her.  Per nursing, pt refusing being turned.  Shakes head "no" to having pain, but unclear whether this is reliable (pt nonverbal), patient continuously shaking head back and forth this morning. No vomiting. continues to have soft stool draining from Gtube.  Secretions still much improved.  Objective: Temp:  [97.5 F (36.4 C)-98.8 F (37.1 C)] 97.5 F (36.4 C) (11/21 0500) Pulse Rate:  [98-110] 106 (11/21 0700) Resp:  [15-20] 20 (11/21 0700) BP: (90-167)/(63-93) 161/85 (11/21 0700) SpO2:  [100 %] 100 % (11/21 0807) FiO2 (%):  [28  %] 28 % (11/21 0807) Weight:  [97.2 kg (214 lb 4.6 oz)] 97.2 kg (214 lb 4.6 oz) (11/21 0500) Physical Exam: General: Chronically-ill appearing female lies in bed, nonverbal, trach in place, sleeping but easily arousable, seems agitated Cardiovascular: RRR, no m/r/g Respiratory: no W/R/R Abdomen: soft and nontender, nondistended, peg in place clean and dry, +draining brown fluid with gravity Extremities: warm and well-perfused, moves all extremities equally  Laboratory:  Recent Labs Lab 08/11/16 0356 08/13/16 0700 08/15/16 0500  WBC 22.4* 14.3* 10.5  HGB 8.5* 7.7* 8.3*  HCT 26.2* 23.9* 26.2*  PLT 258 253 262    Recent Labs Lab 08/09/16 2026  08/13/16 0434 08/14/16 0515 08/15/16 0500  NA 129*  < > 134* 138 137  K 3.3*  < > 3.6 2.8* 3.5  CL 90*  < > 98* 106 109  CO2 30  < > _0 BUN 22*  < > 32* 28* 23*  CREATININE 1.89*  < > 2.42* 2.09* 1.95*  CALCIUM 9.3  < > 8.2* 8.0* 8.1*  PROT 5.8*  --   --   --   --   BILITOT 0.7  --   --   --   --   ALKPHOS 93  --   --   --   --  ALT 11*  --   --   --   --   AST 22  --   --   --   --   GLUCOSE 140*  < > 65 82 142*  < > = values in this interval not displayed. C-diff: positive  Imaging/Diagnostic Tests: Dg Chest Port 1 View FINDINGS: Left PICC terminates at the cavoatrial junction. Right internal jugular central venous catheter terminates at the cavoatrial junction. Tracheostomy tube tip overlies the tracheal air column at the thoracic inlet. Stable cardiomediastinal silhouette with mild cardiomegaly. No pneumothorax. No pleural effusion. Low lung volumes. Vascular crowding without overt pulmonary edema. Mild left basilar opacity appears stable.  IMPRESSION:  1. Left PICC terminates at the cavoatrial junction.  2. Low lung volumes. Vascular crowding without overt pulmonary edema.  3. Stable mild left basilar lung opacity, favor atelectasis.  4. Stable mild cardiomegaly.    Everrett Coombe, MD 08/15/2016, 9:49 AM PGY-1,  [place] Intern pager: [phone], text pages welcome

## 2016-08-15 NOTE — Progress Notes (Signed)
[]  Hide copied text Assessment/ Plan:   1. Acute kidney injury recently on dialysis. Last HD 11/13. Making some urine, will monitor Uop.  - will make daily assessments for need for HD 2. Recurrent SBO: per surgery  3. Sepsis: WBC ct, tachycardia, many potential sources -->PNA, UTI, SBO, c diff. On vanc/ aztreonam. Cultures pending. 4. Acute, now chronic Rf: s/p trach   Subjective: Interval History; Some UOP  Objective: Vital signs in last 24 hours: Temp:  [97.5 F (36.4 C)-98.8 F (37.1 C)] 98.4 F (36.9 C) (11/21 1203) Pulse Rate:  [98-110] 106 (11/21 0700) Resp:  [16-20] 20 (11/21 0700) BP: (90-167)/(63-97) 161/97 (11/21 1203) SpO2:  [100 %] 100 % (11/21 0807) FiO2 (%):  [28 %] 28 % (11/21 0807) Weight:  [97.2 kg (214 lb 4.6 oz)] 97.2 kg (214 lb 4.6 oz) (11/21 0500) Weight change: -0.2 kg (-7.1 oz)  Intake/Output from previous day: 11/20 0701 - 11/21 0700 In: 3119.3 [I.V.:2500; NG/GT:419.3; IV Piggyback:200] Out: 550 [Urine:550] Intake/Output this shift: Total I/O In: 770 [I.V.:600; NG/GT:120; IV Piggyback:50] Out: -   General appearance: alert Back: negative Resp: rhonchi bilaterally GI: soft, non-tender; bowel sounds normal; no masses,  no organomegaly Extremities: edema 1  Lab Results:  Recent Labs  08/13/16 0700 08/15/16 0500  WBC 14.3* 10.5  HGB 7.7* 8.3*  HCT 23.9* 26.2*  PLT 253 262   BMET:  Recent Labs  08/14/16 0515 08/15/16 0500  NA 138 137  K 2.8* 3.5  CL 106 109  CO2 26 23  GLUCOSE 82 142*  BUN 28* 23*  CREATININE 2.09* 1.95*  CALCIUM 8.0* 8.1*    Recent Labs  08/13/16 0434 08/14/16 0515  PTH 12* 33   Iron Studies: No results for input(s): IRON, TIBC, TRANSFERRIN, FERRITIN in the last 72 hours. Studies/Results: No results found.  Scheduled: . aspirin  81 mg Per Tube Daily  . atorvastatin  40 mg Per Tube QHS  . budesonide  0.5 mg Nebulization BID  . cefTRIAXone (ROCEPHIN)  IV  2 g Intravenous Q24H  . chlorhexidine   15 mL Mouth Rinse BID  . darbepoetin (ARANESP) injection - NON-DIALYSIS  40 mcg Subcutaneous Q Sun-1800  . famotidine (PEPCID) IV  20 mg Intravenous Q24H  . feeding supplement (VITAL AF 1.2 CAL)  1,000 mL Per Tube Q24H  . heparin  5,000 Units Subcutaneous Q8H  . insulin aspart  0-9 Units Subcutaneous Q4H  . mouth rinse  15 mL Mouth Rinse q12n4p  . methimazole  10 mg Per Tube Daily  . metoprolol tartrate  12.5 mg Per Tube BID  . nystatin   Topical BID  . polyvinyl alcohol  2 drop Both Eyes BID  . traMADol  50 mg Per Tube Q12H  . vancomycin  125 mg Oral Q6H   Followed by  . [START ON 08/29/2016] vancomycin  125 mg Oral Q8H   Followed by  . [START ON 09/05/2016] vancomycin  125 mg Oral Q12H   Followed by  . [START ON 09/12/2016] vancomycin  125 mg Oral Daily    LOS: 5 days   Alizia Greif C 08/15/2016,1:44 PM

## 2016-08-16 ENCOUNTER — Inpatient Hospital Stay (HOSPITAL_COMMUNITY): Payer: Medicare Other

## 2016-08-16 DIAGNOSIS — J9621 Acute and chronic respiratory failure with hypoxia: Secondary | ICD-10-CM

## 2016-08-16 DIAGNOSIS — E1169 Type 2 diabetes mellitus with other specified complication: Secondary | ICD-10-CM

## 2016-08-16 DIAGNOSIS — E669 Obesity, unspecified: Secondary | ICD-10-CM

## 2016-08-16 DIAGNOSIS — J9620 Acute and chronic respiratory failure, unspecified whether with hypoxia or hypercapnia: Secondary | ICD-10-CM

## 2016-08-16 DIAGNOSIS — I5030 Unspecified diastolic (congestive) heart failure: Secondary | ICD-10-CM

## 2016-08-16 LAB — RENAL FUNCTION PANEL
ALBUMIN: 1.3 g/dL — AB (ref 3.5–5.0)
Anion gap: 7 (ref 5–15)
BUN: 16 mg/dL (ref 6–20)
CALCIUM: 8.2 mg/dL — AB (ref 8.9–10.3)
CO2: 23 mmol/L (ref 22–32)
CREATININE: 1.66 mg/dL — AB (ref 0.44–1.00)
Chloride: 110 mmol/L (ref 101–111)
GFR, EST AFRICAN AMERICAN: 37 mL/min — AB (ref >60)
GFR, EST NON AFRICAN AMERICAN: 32 mL/min — AB (ref >60)
Glucose, Bld: 132 mg/dL — ABNORMAL HIGH (ref 65–99)
PHOSPHORUS: 2.8 mg/dL (ref 2.5–4.6)
Potassium: 3.2 mmol/L — ABNORMAL LOW (ref 3.5–5.1)
Sodium: 140 mmol/L (ref 135–145)

## 2016-08-16 LAB — CULTURE, BLOOD (ROUTINE X 2)
CULTURE: NO GROWTH
Culture: NO GROWTH

## 2016-08-16 LAB — GLUCOSE, CAPILLARY
GLUCOSE-CAPILLARY: 110 mg/dL — AB (ref 65–99)
GLUCOSE-CAPILLARY: 116 mg/dL — AB (ref 65–99)
GLUCOSE-CAPILLARY: 90 mg/dL (ref 65–99)
Glucose-Capillary: 119 mg/dL — ABNORMAL HIGH (ref 65–99)
Glucose-Capillary: 128 mg/dL — ABNORMAL HIGH (ref 65–99)
Glucose-Capillary: 93 mg/dL (ref 65–99)

## 2016-08-16 LAB — CULTURE, BLOOD (SINGLE): CULTURE: NO GROWTH

## 2016-08-16 LAB — MAGNESIUM: MAGNESIUM: 1.5 mg/dL — AB (ref 1.7–2.4)

## 2016-08-16 MED ORDER — MAGNESIUM SULFATE IN D5W 1-5 GM/100ML-% IV SOLN
1.0000 g | Freq: Once | INTRAVENOUS | Status: AC
  Administered 2016-08-16: 1 g via INTRAVENOUS
  Filled 2016-08-16: qty 100

## 2016-08-16 MED ORDER — VITAL AF 1.2 CAL PO LIQD
1000.0000 mL | ORAL | Status: DC
  Administered 2016-08-16 – 2016-08-18 (×3): 1000 mL
  Filled 2016-08-16 (×4): qty 1000

## 2016-08-16 MED ORDER — POTASSIUM CHLORIDE CRYS ER 20 MEQ PO TBCR
40.0000 meq | EXTENDED_RELEASE_TABLET | Freq: Once | ORAL | Status: AC
  Administered 2016-08-16: 40 meq via ORAL
  Filled 2016-08-16: qty 2

## 2016-08-16 NOTE — Progress Notes (Signed)
Spoke with daughter [name]) who visited patient. Updated her with patient's care plan. Answered all questions. She was caretaker for patient prior to hospitalizations. Our team will continue to update her.

## 2016-08-16 NOTE — Progress Notes (Signed)
Patient ID: Angelica Beck, female   DOB: 1951-02-04, 65 y.o.   MRN: [id number]  [facility] Surgery Progress Note     Subjective: Patient sitting comfortably in bed. Tolerating TF. Stool output has not been recorded, but RN confirms that patient has had BM today. When questioned about abdominal pain, nausea, or vomiting patient shakes her head "no."  Objective: Vital signs in last 24 hours: Temp:  [97.1 F (36.2 C)-98.5 F (36.9 C)] 98.5 F (36.9 C) (11/22 0700) Pulse Rate:  [78-92] 91 (11/22 0900) Resp:  [14-20] 16 (11/22 0800) BP: (133-150)/(74-110) 149/86 (11/22 0900) SpO2:  [99 %-100 %] 100 % (11/22 1200) FiO2 (%):  [28 %] 28 % (11/22 1200) Weight:  [219 lb 12.8 oz (99.7 kg)] 219 lb 12.8 oz (99.7 kg) (11/22 0330) Last BM Date: 08/15/16  Intake/Output from previous day: 11/21 0701 - 11/22 0700 In: 1300 [I.V.:1000; NG/GT:200; IV Piggyback:100] Out: 400 [Urine:400] Intake/Output this shift: No intake/output data recorded.  PE: General appearance: alert, cooperative and no distress Neck: trach midline, breathing comfortably GI: obese, soft, no distension, nontender to light and deep palpation, +BS, liquid brown stool in bag from rectal tube  Lab Results:   Recent Labs  08/15/16 0500  WBC 10.5  HGB 8.3*  HCT 26.2*  PLT 262   BMET  Recent Labs  08/15/16 0500 08/16/16 0320  NA 137 140  K 3.5 3.2*  CL 109 110  CO2 23 23  GLUCOSE 142* 132*  BUN 23* 16  CREATININE 1.95* 1.66*  CALCIUM 8.1* 8.2*   PT/INR No results for input(s): LABPROT, INR in the last 72 hours. CMP     Component Value Date/Time   NA 140 08/16/2016 0320   K 3.2 (L) 08/16/2016 0320   CL 110 08/16/2016 0320   CO2 23 08/16/2016 0320   GLUCOSE 132 (H) 08/16/2016 0320   BUN 16 08/16/2016 0320   CREATININE 1.66 (H) 08/16/2016 0320   CALCIUM 8.2 (L) 08/16/2016 0320   PROT 5.8 (L) 08/09/2016 2026   ALBUMIN 1.3 (L) 08/16/2016 0320   AST 22 08/09/2016 2026   ALT 11 (L) 08/09/2016 2026    ALKPHOS 93 08/09/2016 2026   BILITOT 0.7 08/09/2016 2026   GFRNONAA 32 (L) 08/16/2016 0320   GFRAA 37 (L) 08/16/2016 0320   Lipase     Component Value Date/Time   LIPASE 16 06/24/2010 1311       Studies/Results: No results found.  Anti-infectives: Anti-infectives    Start     Dose/Rate Route Frequency Ordered Stop   09/12/16 1000  vancomycin (VANCOCIN) 50 mg/mL oral solution 125 mg     125 mg Oral Daily 08/15/16 0941 09/19/16 0959   09/05/16 1400  vancomycin (VANCOCIN) 50 mg/mL oral solution 125 mg     125 mg Oral Every 12 hours 08/15/16 0941 09/12/16 0959   08/29/16 1400  vancomycin (VANCOCIN) 50 mg/mL oral solution 125 mg     125 mg Oral Every 8 hours 08/15/16 0941 09/05/16 1359   08/15/16 1600  cefTRIAXone (ROCEPHIN) 2 g in dextrose 5 % 50 mL IVPB     2 g 100 mL/hr over 30 Minutes Intravenous Every 24 hours 08/15/16 0711 08/15/16 1730   08/15/16 1200  vancomycin (VANCOCIN) 50 mg/mL oral solution 125 mg     125 mg Oral Every 6 hours 08/15/16 0941 08/29/16 1159   08/14/16 1800  vancomycin (VANCOCIN) 50 mg/mL oral solution 125 mg  Status:  Discontinued     125 mg Oral  Every 6 hours 08/14/16 1520 08/15/16 0941   08/14/16 1600  cefTRIAXone (ROCEPHIN) 2 g in dextrose 5 % 50 mL IVPB  Status:  Discontinued     2 g 100 mL/hr over 30 Minutes Intravenous Every 24 hours 08/14/16 1434 08/15/16 0711   08/12/16 1100  vancomycin (VANCOCIN) IVPB 1000 mg/200 mL premix  Status:  Discontinued     1,000 mg 200 mL/hr over 60 Minutes Intravenous Every 24 hours 08/11/16 1031 08/12/16 1601   08/11/16 2100  metroNIDAZOLE (FLAGYL) IVPB 500 mg  Status:  Discontinued     500 mg 100 mL/hr over 60 Minutes Intravenous Every 8 hours 08/11/16 1334 08/14/16 1520   08/11/16 1100  vancomycin (VANCOCIN) 1,500 mg in sodium chloride 0.9 % 500 mL IVPB     1,500 mg 250 mL/hr over 120 Minutes Intravenous  Once 08/11/16 1031 08/11/16 1448   08/11/16 1100  aztreonam (AZACTAM) 1 g in dextrose 5 % 50 mL IVPB   Status:  Discontinued     1 g 100 mL/hr over 30 Minutes Intravenous Every 8 hours 08/11/16 1031 08/14/16 1431   08/11/16 0930  metroNIDAZOLE (FLAGYL) IVPB 500 mg  Status:  Discontinued     500 mg 100 mL/hr over 60 Minutes Intravenous Every 8 hours 08/11/16 0912 08/11/16 1334   08/10/16 2200  ciprofloxacin (CIPRO) IVPB 400 mg  Status:  Discontinued     400 mg 200 mL/hr over 60 Minutes Intravenous Every 24 hours 08/10/16 2032 08/10/16 2301       Assessment/Plan pSBO versus ileus - awaiting XR to be done today - patient with no subjective abdominal pain, nausea, or vomiting. nontender on exam to light and deep palpation, +BS. Tolerating tube feeds via PEG tube  Plan - patient doing well subjectively and on exam. will await abdominal XR.    LOS: 6 days    Jerrye Beavers , [facility] Surgery 08/16/2016, 12:26 PM Pager: [phone] Consults: [phone] Mon-Fri 7:00 am-4:30 pm Sat-Sun 7:00 am-11:30 am

## 2016-08-16 NOTE — Plan of Care (Signed)
Problem: Skin Integrity: Goal: Risk for impaired skin integrity will decrease Outcome: Progressing Patient had foley removed and is now incontinent of urine. Care is being taken to ensure patient is turned and that she is cleaned from urine. Patient also has flexiseal present to help with skin breakdown from stool.   Problem: Nutrition: Goal: Adequate nutrition will be maintained Outcome: Progressing Patient tube feeds are to be increased to goal of 65 per parameters.   Problem: Bowel/Gastric: Goal: Will not experience complications related to bowel motility Outcome: Progressing Patient output from flexiseal to be monitored more closely today and going forward. Patient continuing to receive oral vancomycin to treat C DIff infection.

## 2016-08-16 NOTE — Progress Notes (Signed)
Family Medicine Teaching Service Daily Progress Note Intern Pager: [phone]  Patient name: Angelica Beck Medical record number: [id number] Date of birth: 09/29/1950 Age: 65 y.o. Gender: female  Primary Care Provider: Delia Chimes, NP Consultants: General Surgery, ID, Nephrology Code Status: Full Chief Complaint: None  Assessment and Plan: Angelica Beck is a 65 y.o. female presenting with SBO. PMH is significant for stroke (residual mild left sided weakness, non-ambulatory, bedbound), T2DM, HTN, hyperthyroidism, HLD, CAD, HFpEF, ARF 2/2 ATN on intermittent HD, s/p trach and G-tube 06/2016.   # Small Bowel Obstruction v ileus, resolved:   Treated conservatively, seemed to have resolved on its own.  Surgery following. Patient with increased abdominal pain to palpation on exam - G-tube drain to gravity - restarted tube feeds (11/21), adv as tol, spoke with dietetics - they will see her today for plan when we adv bowel regimen - Surgery to see pt today - ordered KUB - Pepcid 20 mg IV qd for SUP - Monitor I/Os - 200 ml stool out/24h  # Agitation - Patient alert today.  Still agitated. Considered delirium, but I think its due to abdominal pain. Grimaces with palpation, shakes head no to pain but this is unreliable in nonverbal patient. Slaps my hand away on exam.  See above for SBO plan.  - if patient becomes acutely agitated plan to evaluate and consider ativan if its delirum  # Healthcare Acquired Pneumonia, improving: Patient recently treated for HAP at last admission. CT this admission shows LLL PNA thought to be new, not just resolving from previous infection. Leukocytosis resolved. Concerns for aspiration PNA given copious secretions from trach. BCx NGTD.  - Completed 5/5 days Aztreonam (11/17 - 11/20) and ceftriaxone (11/21) - blood cultures NGx3D - appreciate ID recs  # H/o C-diff Infection, Acute on Chronic, stable: Patient recently treated after positive test on 10/10 with vanc  PO at discharge. Unclear when abx ended. Positive on admission.  - S/p 4 days Flagyl IV 500 mg q8h (11/17 - 11/20) given NPO for SBO - Now restarted tube feeds: continue day #3 oral vanco '125mg'$  QID x 14 days (11/20 - 12/3) then plan for taper '125mg'$  TID x 1 week , then '125mg'$  BID x 1 week, then '125mg'$  daily x 1 week. - appreciate ID recs  # Chronic Respiratory Failure with Trach, Stable:  Trach placed 10/20 at last hospitalization when patient had chronic hypoxic respiratory failure due to encephalopathy that prevented her from protecting her airway as well as obesity hypoventilation syndrome. - Rubinol helping with secretions   - Continue trach collar and routine trach care - Albuterol neb every 4 hours prn   # Acute Kidney Injury, Likely 2/2 ATN, improved:  Cr now trends down  2.15>>>1.66 (BL 1.6-1.8) Pt was on CRRT during last hospitalization, transitioned to intermittent HD, last 11/13.  FeNa 0.6% suggestive of prerenal etiology for AKI. - 24 Cr clearance low at 10 [ref 75-115] - Nephrology consulted, appreciate recs - monitoring for need for HD daily - Strict I/Os - Trend BMET  # Electrolyte Abnormalities, Resolved:  Hyponatremia resolved (138), hypokalemia on admission, 2.8 >> 3.2 today, will replete. - Mag improved to 1.9 yesterday, will repeat mag level today - Strict I/Os   # Anemia of Chronic Disease, Improved: Hgb stable. Iron panel from 06/2016 with normal iron, low TIBC, and high ferritin.  # Diabetes Mellitus Type 2:  Last A1c 5.8%. Pt on Humalog sliding scale at home. CBG 135-161 overnight. - Sensitive SSI - will space q4  CBGs now tol tube feeds - can consider transitioning back to home med as tube feeds increased  # HFpEF: Last ECHO 06/21/16: EF 60-65%, G1DD. Takes Metoprolol tartrate '25mg'$  bid at home. Restarted metoprolol at reduced dose 12.5 mg BID for now. VSS overnight BP 76, BP 133/74 - continue to monitor, can inc metop to home dose as appropriate  #  Hyperthyroidism: Pt taking Methimazole '10mg'$  daily. Last TSH 11/11 was 0.641, free T4 1.74. - Methimazole restarted now that tol tube feeds  # H/o CVA, Stable: Pt has residual left sided weakness and is non-ambulatory. Prior to her last hospitalization, she was living with her daughter. She was completely reliant on her family for ADLs and she was mostly bed bound. - Restarted Aspirin and Lipitor now tol tube feeds  FEN/GI:  G-tube drain to gravity, D5NS at 100 ml/hr Prophylaxis: Heparin SQ  Disposition: [facility]  Subjective:  Pt appears uncomfortable on exam today, slaps my hand away, abdomen tender to palpation based on pt grimacing, shakes head "no" to pain but this seems unreliable in nonverbal patient. Nurse at bedside will page [place] if family around today since they better know baseline.  Objective: Temp:  [97.1 F (36.2 C)-98.5 F (36.9 C)] 98.5 F (36.9 C) (11/22 0700) Pulse Rate:  [78-92] 83 (11/22 0700) Resp:  [14-20] 16 (11/22 0700) BP: (133-161)/(74-110) 133/74 (11/22 0700) SpO2:  [99 %-100 %] 100 % (11/22 0754) FiO2 (%):  [28 %] 28 % (11/22 0754) Weight:  [99.7 kg (219 lb 12.8 oz)] 99.7 kg (219 lb 12.8 oz) (11/22 0330) Physical Exam: General: Chronically-ill appearing female lies in bed, nonverbal, trach in place, alert, seems agitated Cardiovascular: RRR, no m/r/g Respiratory: no W/R/R Abdomen: soft and +tender, nondistended, peg in place clean and dry, +draining brown fluid with gravity Extremities: warm and well-perfused, moves all extremities equally  Laboratory:  Recent Labs Lab 08/11/16 0356 08/13/16 0700 08/15/16 0500  WBC 22.4* 14.3* 10.5  HGB 8.5* 7.7* 8.3*  HCT 26.2* 23.9* 26.2*  PLT 258 253 262    Recent Labs Lab 08/09/16 2026  08/14/16 0515 08/15/16 0500 08/16/16 0320  NA 129*  < > 138 137 140  K 3.3*  < > 2.8* 3.5 3.2*  CL 90*  < > 106 109 110  CO2 30  < > '26 23 23  '$ BUN 22*  < > 28* 23* 16  CREATININE 1.89*  < > 2.09* 1.95* 1.66*   CALCIUM 9.3  < > 8.0* 8.1* 8.2*  PROT 5.8*  --   --   --   --   BILITOT 0.7  --   --   --   --   ALKPHOS 93  --   --   --   --   ALT 11*  --   --   --   --   AST 22  --   --   --   --   GLUCOSE 140*  < > 82 142* 132*  < > = values in this interval not displayed. C-diff: positive  Imaging/Diagnostic Tests: Dg Chest Port 1 View FINDINGS: Left PICC terminates at the cavoatrial junction. Right internal jugular central venous catheter terminates at the cavoatrial junction. Tracheostomy tube tip overlies the tracheal air column at the thoracic inlet. Stable cardiomediastinal silhouette with mild cardiomegaly. No pneumothorax. No pleural effusion. Low lung volumes. Vascular crowding without overt pulmonary edema. Mild left basilar opacity appears stable.  IMPRESSION:  1. Left PICC terminates at the cavoatrial junction.  2. Low lung volumes. Vascular crowding without overt pulmonary edema.  3. Stable mild left basilar lung opacity, favor atelectasis.  4. Stable mild cardiomegaly.    Everrett Coombe, MD 08/16/2016, 8:19 AM PGY-1, [place] Intern pager: [phone], text pages welcome

## 2016-08-16 NOTE — Plan of Care (Signed)
Problem: Skin Integrity: Goal: Risk for impaired skin integrity will decrease Outcome: Progressing Patient being monitored closely for breakdown in presence of incontinence.   Problem: Nutrition: Goal: Adequate nutrition will be maintained Outcome: Progressing Patient TFeeds to be increased to new goal of 65.   Problem: Bowel/Gastric: Goal: Will not experience complications related to bowel motility Outcome: Progressing Patient still with flexiseal. Ensuring measured with each shift for proper monitoring. 150 out on AM shift.

## 2016-08-16 NOTE — Progress Notes (Signed)
Assessment/ Plan:   1. Acute kidney injury recently on dialysis. Last HD 11/13. Making some urine, will cont to monitor off HD.  OK to transfer to [facility] from renal perspective with renal f/u there for any additional dialysis need 2. Recurrent SBO: per surgery  3. Sepsis, improved 4. Acute, now chronic Resp f: s/p trach  Plan: Cont to follow, K replace   Subjective: Interval History: Making some urine  Objective: Vital signs in last 24 hours: Temp:  [97.1 F (36.2 C)-98.5 F (36.9 C)] 98.5 F (36.9 C) (11/22 0700) Pulse Rate:  [78-92] 91 (11/22 0900) Resp:  [14-20] 16 (11/22 0800) BP: (133-161)/(74-110) 149/86 (11/22 0900) SpO2:  [99 %-100 %] 100 % (11/22 0754) FiO2 (%):  [28 %] 28 % (11/22 0754) Weight:  [99.7 kg (219 lb 12.8 oz)] 99.7 kg (219 lb 12.8 oz) (11/22 0330) Weight change: 2.5 kg (5 lb 8.2 oz)  Intake/Output from previous day: 11/21 0701 - 11/22 0700 In: 1300 [I.V.:1000; NG/GT:200; IV Piggyback:100] Out: 400 [Urine:400] Intake/Output this shift: No intake/output data recorded.  General appearance: some agitation Throat: lips, mucosa, and tongue normal; teeth and gums normal and trach with congestion  Some pedal edema but no leg edema abd soft  Lab Results:  Recent Labs  08/15/16 0500  WBC 10.5  HGB 8.3*  HCT 26.2*  PLT 262   BMET:  Recent Labs  08/15/16 0500 08/16/16 0320  NA 137 140  K 3.5 3.2*  CL 109 110  CO2 23 23  GLUCOSE 142* 132*  BUN 23* 16  CREATININE 1.95* 1.66*  CALCIUM 8.1* 8.2*    Recent Labs  08/14/16 0515  PTH 33   Iron Studies: No results for input(s): IRON, TIBC, TRANSFERRIN, FERRITIN in the last 72 hours. Studies/Results: No results found.  Scheduled: . aspirin  81 mg Per Tube Daily  . atorvastatin  40 mg Per Tube QHS  . budesonide  0.5 mg Nebulization BID  . chlorhexidine  15 mL Mouth Rinse BID  . darbepoetin (ARANESP) injection - NON-DIALYSIS  40 mcg Subcutaneous Q Sun-1800  . famotidine (PEPCID) IV  20  mg Intravenous Q24H  . feeding supplement (VITAL AF 1.2 CAL)  1,000 mL Per Tube Q24H  . heparin  5,000 Units Subcutaneous Q8H  . insulin aspart  0-9 Units Subcutaneous Q4H  . mouth rinse  15 mL Mouth Rinse q12n4p  . methimazole  10 mg Per Tube Daily  . metoprolol tartrate  12.5 mg Per Tube BID  . nystatin   Topical BID  . polyvinyl alcohol  2 drop Both Eyes BID  . traMADol  50 mg Per Tube Q12H  . vancomycin  125 mg Oral Q6H   Followed by  . [START ON 08/29/2016] vancomycin  125 mg Oral Q8H   Followed by  . [START ON 09/05/2016] vancomycin  125 mg Oral Q12H   Followed by  . [START ON 09/12/2016] vancomycin  125 mg Oral Daily    LOS: 6 days   Mylen Mangan C 08/16/2016,10:56 AM

## 2016-08-16 NOTE — Progress Notes (Signed)
Nutrition Follow-up / Consult  DOCUMENTATION CODES:   Obesity unspecified  INTERVENTION:    Increase Vital AF 1.2 via PEG by 10 ml every 4 hours to goal rate of 65 ml/hr which provides 1872 kcal (100% of needs), 117 grams of protein, and 1264 ml of H2O.   NUTRITION DIAGNOSIS:   Inadequate oral intake related to inability to eat as evidenced by NPO status.  Ongoing  GOAL:   Patient will meet greater than or equal to 90% of their needs  Progressing  MONITOR:   TF tolerance, Labs, Weight trends, Skin, I & O's  REASON FOR ASSESSMENT:   Consult Enteral/tube feeding initiation and management  ASSESSMENT:   65 y.o. female presenting with SBO. PMH is significant for stroke (residual mild left sided weakness, non-ambulatory, bedbound), T2DM, HTN, hyperthyroidism, HLD, CAD, HFpEF, ARF 2/2 ATN on intermittent HD, s/p trach and G-tube 06/2016.  Spoke with bedside RN and Case Management RN who suspects that patient will eventually  be discharging to [facility], maybe today if medically ready. Patient has been tolerating TF well at 20 ml/h. Received MD consult to advance TF. Nutrition-Focused physical exam completed. Findings are no fat depletion, no muscle depletion, and no edema.   Diet Order:   NPO  Skin:  Wound (see comment) (Stg 2 on lip and coccyx)  Last BM:  11/16  Height:   Ht Readings from Last 1 Encounters:  08/11/16 '5\' 6"'$  (1.676 m)    Weight:   Wt Readings from Last 1 Encounters:  08/16/16 219 lb 12.8 oz (99.7 kg)    Ideal Body Weight:  59 kg  BMI:  Body mass index is 35.48 kg/m.  Estimated Nutritional Needs:   Kcal:  1800-2000  Protein:  100-120 grams  Fluid:  Per MD  EDUCATION NEEDS:   No education needs identified at this time  Molli Barrows, [place], [place], [place] Pager [phone] After Hours Pager [phone]

## 2016-08-17 LAB — CBC
HEMATOCRIT: 25.1 % — AB (ref 36.0–46.0)
HEMOGLOBIN: 7.9 g/dL — AB (ref 12.0–15.0)
MCH: 27.5 pg (ref 26.0–34.0)
MCHC: 31.5 g/dL (ref 30.0–36.0)
MCV: 87.5 fL (ref 78.0–100.0)
Platelets: 274 10*3/uL (ref 150–400)
RBC: 2.87 MIL/uL — AB (ref 3.87–5.11)
RDW: 15.2 % (ref 11.5–15.5)
WBC: 9.8 10*3/uL (ref 4.0–10.5)

## 2016-08-17 LAB — RENAL FUNCTION PANEL
Albumin: 1.3 g/dL — ABNORMAL LOW (ref 3.5–5.0)
Anion gap: 4 — ABNORMAL LOW (ref 5–15)
BUN: 15 mg/dL (ref 6–20)
CHLORIDE: 113 mmol/L — AB (ref 101–111)
CO2: 23 mmol/L (ref 22–32)
Calcium: 8.3 mg/dL — ABNORMAL LOW (ref 8.9–10.3)
Creatinine, Ser: 1.53 mg/dL — ABNORMAL HIGH (ref 0.44–1.00)
GFR calc Af Amer: 41 mL/min — ABNORMAL LOW (ref >60)
GFR calc non Af Amer: 35 mL/min — ABNORMAL LOW (ref >60)
GLUCOSE: 204 mg/dL — AB (ref 65–99)
POTASSIUM: 3.6 mmol/L (ref 3.5–5.1)
Phosphorus: 2.7 mg/dL (ref 2.5–4.6)
Sodium: 140 mmol/L (ref 135–145)

## 2016-08-17 LAB — BASIC METABOLIC PANEL
ANION GAP: 3 — AB (ref 5–15)
BUN: 15 mg/dL (ref 6–20)
CO2: 24 mmol/L (ref 22–32)
Calcium: 8.3 mg/dL — ABNORMAL LOW (ref 8.9–10.3)
Chloride: 113 mmol/L — ABNORMAL HIGH (ref 101–111)
Creatinine, Ser: 1.49 mg/dL — ABNORMAL HIGH (ref 0.44–1.00)
GFR calc Af Amer: 42 mL/min — ABNORMAL LOW (ref >60)
GFR calc non Af Amer: 36 mL/min — ABNORMAL LOW (ref >60)
GLUCOSE: 206 mg/dL — AB (ref 65–99)
POTASSIUM: 3.6 mmol/L (ref 3.5–5.1)
Sodium: 140 mmol/L (ref 135–145)

## 2016-08-17 LAB — GLUCOSE, CAPILLARY
GLUCOSE-CAPILLARY: 170 mg/dL — AB (ref 65–99)
GLUCOSE-CAPILLARY: 117 mg/dL — AB (ref 65–99)
GLUCOSE-CAPILLARY: 122 mg/dL — AB (ref 65–99)
Glucose-Capillary: 60 mg/dL — ABNORMAL LOW (ref 65–99)
Glucose-Capillary: 175 mg/dL — ABNORMAL HIGH (ref 65–99)
Glucose-Capillary: 139 mg/dL — ABNORMAL HIGH (ref 65–99)
Glucose-Capillary: 124 mg/dL — ABNORMAL HIGH (ref 65–99)

## 2016-08-17 LAB — MAGNESIUM: Magnesium: 1.6 mg/dL — ABNORMAL LOW (ref 1.7–2.4)

## 2016-08-17 MED ORDER — FUROSEMIDE 10 MG/ML IJ SOLN
80.0000 mg | Freq: Once | INTRAMUSCULAR | Status: AC
  Administered 2016-08-17: 80 mg via INTRAVENOUS
  Filled 2016-08-17: qty 8

## 2016-08-17 MED ORDER — DEXTROSE 50 % IV SOLN
INTRAVENOUS | Status: AC
  Administered 2016-08-17: 50 mL
  Filled 2016-08-17: qty 50

## 2016-08-17 NOTE — Progress Notes (Signed)
Patient's 4am blood glucose was 60. No changes in behavior. Was given an amp of D50 and rechecked. Her latest blood sugar was 175. Paged oncall resident to notify. Will continue to monitor for patient safety.

## 2016-08-17 NOTE — Progress Notes (Signed)
Family Medicine Teaching Service Daily Progress Note Intern Pager: [phone]  Patient name: Angelica Beck Medical record number: [id number] Date of birth: 30-Nov-1950 Age: 65 y.o. Gender: female  Primary Care Provider: Delia Chimes, NP Consultants: General Surgery, ID, Nephrology Code Status: Full Chief Complaint: None  Assessment and Plan: Angelica Beck is a 65 y.o. female presenting with SBO. PMH is significant for stroke (residual mild left sided weakness, non-ambulatory, bedbound), T2DM, HTN, hyperthyroidism, HLD, CAD, HFpEF, ARF 2/2 ATN on intermittent HD, s/p trach and G-tube 06/2016.   # Small Bowel Obstruction v ileus, resolved:   Treated conservatively, seemed to have resolved on its own.  Surgery following. - G-tube drain to gravity - restarted tube feeds (11/21), adv as tolerated - dietetic following, appreciate recommendations - Surgery following, appreciate recommendations, do not think patient is surgical candidate at this time - KUB 11/22 demonstrates possible intermittent/partial SBO - Pepcid 20 mg IV qd for SUP - Monitor I/Os - 150 ml stool out/24h  # Agitation - Patient alert today.  Still agitated. Considered delirium, but I think its due to abdominal pain. Grimaces with palpation, shakes head no to pain but this is unreliable in nonverbal patient. Slaps my hand away on exam.  See above for SBO plan.  - if patient becomes acutely agitated plan to evaluate and consider ativan if its delirum  # Healthcare Acquired Pneumonia, improving: Patient recently treated for HAP at last admission. CT this admission shows LLL PNA thought to be new, not just resolving from previous infection. Leukocytosis resolved. Concerns for aspiration PNA given copious secretions from trach. BCx NGTD.  - Completed 5/5 days Aztreonam (11/17 - 11/20) and ceftriaxone (11/21) - blood cultures NGx3D - appreciate ID recs  # H/o C-diff Infection, Acute on Chronic, stable: Patient recently treated  after positive test on 10/10 with vanc PO at discharge. Unclear when abx ended. Positive on admission.  - S/p 4 days Flagyl IV 500 mg q8h (11/17 - 11/20) given NPO for SBO - Now restarted tube feeds: continue day #3 oral vanco '125mg'$  QID x 14 days (11/20 - 12/3) then plan for taper '125mg'$  TID x 1 week , then '125mg'$  BID x 1 week, then '125mg'$  daily x 1 week. - appreciate ID recs  # Chronic Respiratory Failure with Trach, Stable:  Trach placed 10/20 at last hospitalization when patient had chronic hypoxic respiratory failure due to encephalopathy that prevented her from protecting her airway as well as obesity hypoventilation syndrome. - Rubinol helping with secretions   - Continue trach collar and routine trach care - Albuterol neb every 4 hours prn   # Acute Kidney Injury, Likely 2/2 ATN, improved:  Cr now trends down  2.15>>>1.53 (BL 1.6-1.8) Pt was on CRRT during last hospitalization, transitioned to intermittent HD, last 11/13.  FeNa 0.6% suggestive of prerenal etiology for AKI. - 24 Cr clearance low at 10 [ref 75-115] - Nephrology consulted- ok to d/c from their standpoint - Strict I/Os - Trend BMET  # Electrolyte Abnormalities, Resolved:  Hyponatremia resolved (138), hypokalemia on admission, 2.8 >> 3.2 today, will replete. - Mag improved to 1.9 yesterday, will repeat mag level today - Strict I/Os   # Anemia of Chronic Disease, Improved: Hgb stable. Iron panel from 06/2016 with normal iron, low TIBC, and high ferritin.  # Diabetes Mellitus Type 2:  Last A1c 5.8%. Pt on Humalog sliding scale at home. CBG 60-175 overnight. - Sensitive SSI - will space q4 CBGs now tol tube feeds - can consider transitioning back  to home med as tube feeds increased  # HFpEF: Last ECHO 06/21/16: EF 60-65%, G1DD. Takes Metoprolol tartrate '25mg'$  bid at home. Restarted metoprolol at reduced dose 12.5 mg BID for now. VSS overnight BP 76, BP 139/93 - continue to monitor, can increase metop to home dose as  appropriate  # Hyperthyroidism: Pt taking Methimazole '10mg'$  daily. Last TSH 11/11 was 0.641, free T4 1.74. - Continue Methimazole  # H/o CVA, Stable: Pt has residual left sided weakness and is non-ambulatory. Prior to her last hospitalization, she was living with her daughter. She was completely reliant on her family for ADLs and she was mostly bed bound. - Continue Aspirin and Lipitor now tolerating tube feeds  FEN/GI:  G-tube drain to gravity, D5NS at 100 ml/hr Prophylaxis: Heparin SQ  Disposition: [facility]  Subjective:  Ms. Dumm is shaking her head "no" when asked about stomach pain today.   Objective: Temp:  [97.6 F (36.4 C)-98.4 F (36.9 C)] 98.4 F (36.9 C) (11/23 0429) Pulse Rate:  [78-99] 93 (11/23 0429) Resp:  [12-21] 15 (11/23 0429) BP: (131-157)/(70-112) 136/73 (11/23 0429) SpO2:  [100 %] 100 % (11/23 0429) FiO2 (%):  [28 %] 28 % (11/23 0429) Weight:  [223 lb 12.3 oz (101.5 kg)] 223 lb 12.3 oz (101.5 kg) (11/23 0429) Physical Exam: General: Chronically-ill appearing female lies in bed, nonverbal, trach in place, alert, in no acute distress Cardiovascular: RRR, no m/r/g Respiratory: no W/R/R Abdomen: soft, non-tender, nondistended, peg in place clean and dry, +draining brown fluid with gravity Extremities: warm and well-perfused, moves all extremities equally  Laboratory:  Recent Labs Lab 08/13/16 0700 08/15/16 0500 08/17/16 0630  WBC 14.3* 10.5 9.8  HGB 7.7* 8.3* 7.9*  HCT 23.9* 26.2* 25.1*  PLT 253 262 274    Recent Labs Lab 08/15/16 0500 08/16/16 0320 08/17/16 0630  NA 137 140 140  140  K 3.5 3.2* 3.6  3.6  CL 109 110 113*  113*  CO2 '23 23 23  24  '$ BUN 23* '16 15  15  '$ CREATININE 1.95* 1.66* 1.53*  1.49*  CALCIUM 8.1* 8.2* 8.3*  8.3*  GLUCOSE 142* 132* 204*  206*   C-diff: positive  Imaging/Diagnostic Tests: Dg Chest Port 1 View FINDINGS: Left PICC terminates at the cavoatrial junction. Right internal jugular central venous  catheter terminates at the cavoatrial junction. Tracheostomy tube tip overlies the tracheal air column at the thoracic inlet. Stable cardiomediastinal silhouette with mild cardiomegaly. No pneumothorax. No pleural effusion. Low lung volumes. Vascular crowding without overt pulmonary edema. Mild left basilar opacity appears stable.  IMPRESSION:  1. Left PICC terminates at the cavoatrial junction.  2. Low lung volumes. Vascular crowding without overt pulmonary edema.  3. Stable mild left basilar lung opacity, favor atelectasis.  4. Stable mild cardiomegaly.    Steve Rattler, DO 08/17/2016, 7:33 AM PGY-1, [place] Intern pager: [phone], text pages welcome

## 2016-08-17 NOTE — Progress Notes (Signed)
Subjective: Patient resting comfortably in bed.  Tolerating TF at goal rate. Bowel movement yesterday Patient reports no abdominal pain  Objective: Vital signs in last 24 hours: Temp:  [97.6 F (36.4 C)-98.4 F (36.9 C)] 98.4 F (36.9 C) (11/23 0429) Pulse Rate:  [78-99] 93 (11/23 0429) Resp:  [12-21] 15 (11/23 0429) BP: (131-157)/(70-112) 136/73 (11/23 0429) SpO2:  [100 %] 100 % (11/23 0429) FiO2 (%):  [28 %] 28 % (11/23 0429) Weight:  [101.5 kg (223 lb 12.3 oz)] 101.5 kg (223 lb 12.3 oz) (11/23 0429) Last BM Date: 08/16/16  Intake/Output from previous day: 11/22 0701 - 11/23 0700 In: 2956.2 [I.V.:2100; NG/GT:706.2; IV Piggyback:150] Out: 150 [Stool:150] Intake/Output this shift: No intake/output data recorded.  GEN:  Alert, cooperative; comfortable appearance Neck - trach;  Resp - normal respiratory effort; bilateral rales Abd - obese; soft, non-distended: + BS; stool in rectal tube Lab Results:   Recent Labs  08/15/16 0500 08/17/16 0630  WBC 10.5 9.8  HGB 8.3* 7.9*  HCT 26.2* 25.1*  PLT 262 274   BMET  Recent Labs  08/16/16 0320 08/17/16 0630  NA 140 140  140  K 3.2* 3.6  3.6  CL 110 113*  113*  CO2 '23 23  24  '$ GLUCOSE 132* 204*  206*  BUN '16 15  15  '$ CREATININE 1.66* 1.53*  1.49*  CALCIUM 8.2* 8.3*  8.3*   PT/INR No results for input(s): LABPROT, INR in the last 72 hours. ABG No results for input(s): PHART, HCO3 in the last 72 hours.  Invalid input(s): PCO2, PO2  Studies/Results: Dg Abd 2 Views  Result Date: 08/16/2016 CLINICAL DATA:  65 year old female with a history of small bowel obstruction EXAM: ABDOMEN - 2 VIEW COMPARISON:  Multiple prior plain film most recent 08/12/2016. CT 08/09/2016 FINDINGS: Since the prior plain film there has has been near complete evacuation of enteric contrast. Persisting borderline dilated small bowel loops particularly in the left abdomen. Paucity of colonic gas and rectal gas. Gastric tube is unchanged  in position. Decubitus view demonstrates air-fluid levels within small bowel. Fibroid uterus. Pelvic phleboliths. Degenerative changes of the spine and of the right greater than left hips. IMPRESSION: Since the prior plain film there has been near complete evacuation of enteric contrast, however, persist mild dilation of the bowel loops with relative absence of colonic gas suggests persisting intermittent/partial small bowel obstruction. Gastric tube is unchanged in position. Signed, Dulcy Fanny. Earleen Newport, DO [facility] Radiology Electronically Signed   By: Corrie Mckusick D.O.   On: 08/16/2016 14:10    Anti-infectives: Anti-infectives    Start     Dose/Rate Route Frequency Ordered Stop   09/12/16 1000  vancomycin (VANCOCIN) 50 mg/mL oral solution 125 mg     125 mg Oral Daily 08/15/16 0941 09/19/16 0959   09/05/16 1400  vancomycin (VANCOCIN) 50 mg/mL oral solution 125 mg     125 mg Oral Every 12 hours 08/15/16 0941 09/12/16 0959   08/29/16 1400  vancomycin (VANCOCIN) 50 mg/mL oral solution 125 mg     125 mg Oral Every 8 hours 08/15/16 0941 09/05/16 1359   08/15/16 1600  cefTRIAXone (ROCEPHIN) 2 g in dextrose 5 % 50 mL IVPB     2 g 100 mL/hr over 30 Minutes Intravenous Every 24 hours 08/15/16 0711 08/15/16 1730   08/15/16 1200  vancomycin (VANCOCIN) 50 mg/mL oral solution 125 mg     125 mg Oral Every 6 hours 08/15/16 0941 08/29/16 1159  08/14/16 1800  vancomycin (VANCOCIN) 50 mg/mL oral solution 125 mg  Status:  Discontinued     125 mg Oral Every 6 hours 08/14/16 1520 08/15/16 0941   08/14/16 1600  cefTRIAXone (ROCEPHIN) 2 g in dextrose 5 % 50 mL IVPB  Status:  Discontinued     2 g 100 mL/hr over 30 Minutes Intravenous Every 24 hours 08/14/16 1434 08/15/16 0711   08/12/16 1100  vancomycin (VANCOCIN) IVPB 1000 mg/200 mL premix  Status:  Discontinued     1,000 mg 200 mL/hr over 60 Minutes Intravenous Every 24 hours 08/11/16 1031 08/12/16 1601    08/11/16 2100  metroNIDAZOLE (FLAGYL) IVPB 500 mg  Status:  Discontinued     500 mg 100 mL/hr over 60 Minutes Intravenous Every 8 hours 08/11/16 1334 08/14/16 1520   08/11/16 1100  vancomycin (VANCOCIN) 1,500 mg in sodium chloride 0.9 % 500 mL IVPB     1,500 mg 250 mL/hr over 120 Minutes Intravenous  Once 08/11/16 1031 08/11/16 1448   08/11/16 1100  aztreonam (AZACTAM) 1 g in dextrose 5 % 50 mL IVPB  Status:  Discontinued     1 g 100 mL/hr over 30 Minutes Intravenous Every 8 hours 08/11/16 1031 08/14/16 1431   08/11/16 0930  metroNIDAZOLE (FLAGYL) IVPB 500 mg  Status:  Discontinued     500 mg 100 mL/hr over 60 Minutes Intravenous Every 8 hours 08/11/16 0912 08/11/16 1334   08/10/16 2200  ciprofloxacin (CIPRO) IVPB 400 mg  Status:  Discontinued     400 mg 200 mL/hr over 60 Minutes Intravenous Every 24 hours 08/10/16 2032 08/10/16 2301      Assessment/Plan: pSBO versus ileus - plain films much improved - patient with no subjective abdominal pain, nausea, or vomiting. Nontender on exam to light and deep palpation, +BS. Tolerating tube feeds via PEG tube  Plan - patient doing well subjectively and on exam. Keep electrolytes normal.  No surgical indications.  Call us back if needed.  LOS: 7 days    Moataz Tavis K. 08/17/2016

## 2016-08-17 NOTE — Progress Notes (Signed)
Assessment/ Plan:   1. Acute kidney injury recently on dialysis. Last HD 11/13. Making some urine, will cont to monitor off HD.  OK to transfer to [facility] from [facility] perspective with renal f/u there for any additional dialysis need 2. Recurrent SBO: per surgery  3. Sepsis, improved 4. Acute, now chronic Resp f: s/p trach  Plan: Cont to follow, weight up.  Give lasix  Subjective: Incontinent of urine  Objective: Vital signs in last 24 hours: Temp:  [97.6 F (36.4 C)-98.4 F (36.9 C)] 98.3 F (36.8 C) (11/23 1200) Pulse Rate:  [85-100] 90 (11/23 1200) Resp:  [12-21] 14 (11/23 1200) BP: (131-151)/(70-93) 131/86 (11/23 1200) SpO2:  [100 %] 100 % (11/23 1200) FiO2 (%):  [28 %] 28 % (11/23 1159) Weight:  [101.5 kg (223 lb 12.3 oz)] 101.5 kg (223 lb 12.3 oz) (11/23 0429) Weight change: 1.8 kg (3 lb 15.5 oz)  Intake/Output from previous day: 11/22 0701 - 11/23 0700 In: 2956.2 [I.V.:2100; NG/GT:706.2; IV Piggyback:150] Out: 150 [Stool:150] Intake/Output this shift: Total I/O In: 810 [I.V.:510; NG/GT:300] Out: -   General appearance: alert Neck: no adenopathy, no carotid bruit, no JVD, supple, symmetrical, trachea midline, thyroid not enlarged, symmetric, no tenderness/mass/nodules and trach  Obese [facility] RUC  Lab Results:  Recent Labs  08/15/16 0500 08/17/16 0630  WBC 10.5 9.8  HGB 8.3* 7.9*  HCT 26.2* 25.1*  PLT 262 274   BMET:  Recent Labs  08/16/16 0320 08/17/16 0630  NA 140 140  140  K 3.2* 3.6  3.6  CL 110 113*  113*  CO2 '23 23  24  '$ GLUCOSE 132* 204*  206*  BUN '16 15  15  '$ CREATININE 1.66* 1.53*  1.49*  CALCIUM 8.2* 8.3*  8.3*   No results for input(s): PTH in the last 72 hours. Iron Studies: No results for input(s): IRON, TIBC, TRANSFERRIN, FERRITIN in the last 72 hours. Studies/Results: Dg Abd 2 Views  Result Date: 08/16/2016 CLINICAL DATA:  65 year old female with a history of small bowel obstruction EXAM: ABDOMEN - 2 VIEW COMPARISON:   Multiple prior plain film most recent 08/12/2016. CT 08/09/2016 FINDINGS: Since the prior plain film there has has been near complete evacuation of enteric contrast. Persisting borderline dilated small bowel loops particularly in the left abdomen. Paucity of colonic gas and rectal gas. Gastric tube is unchanged in position. Decubitus view demonstrates air-fluid levels within small bowel. Fibroid uterus. Pelvic phleboliths. Degenerative changes of the spine and of the right greater than left hips. IMPRESSION: Since the prior plain film there has been near complete evacuation of enteric contrast, however, persist mild dilation of the bowel loops with relative absence of colonic gas suggests persisting intermittent/partial small bowel obstruction. Gastric tube is unchanged in position. Signed, Dulcy Fanny. Earleen Newport, DO [facility] Radiology Electronically Signed   By: Corrie Mckusick D.O.   On: 08/16/2016 14:10    Scheduled: . aspirin  81 mg Per Tube Daily  . atorvastatin  40 mg Per Tube QHS  . budesonide  0.5 mg Nebulization BID  . chlorhexidine  15 mL Mouth Rinse BID  . darbepoetin (ARANESP) injection - NON-DIALYSIS  40 mcg Subcutaneous Q Sun-1800  . famotidine (PEPCID) IV  20 mg Intravenous Q24H  . feeding supplement (VITAL AF 1.2 CAL)  1,000 mL Per Tube Q24H  . heparin  5,000 Units Subcutaneous Q8H  . insulin aspart  0-9 Units Subcutaneous Q4H  . mouth rinse  15 mL Mouth Rinse q12n4p  .  methimazole  10 mg Per Tube Daily  . metoprolol tartrate  12.5 mg Per Tube BID  . nystatin   Topical BID  . polyvinyl alcohol  2 drop Both Eyes BID  . traMADol  50 mg Per Tube Q12H  . vancomycin  125 mg Oral Q6H   Followed by  . [START ON 08/29/2016] vancomycin  125 mg Oral Q8H   Followed by  . [START ON 09/05/2016] vancomycin  125 mg Oral Q12H   Followed by  . [START ON 09/12/2016] vancomycin  125 mg Oral Daily     LOS: 7 days   Nader Boys C 08/17/2016,1:35  PM

## 2016-08-17 NOTE — Plan of Care (Signed)
Problem: Nutrition: Goal: Adequate nutrition will be maintained Outcome: Progressing Patient Tube Feedings now at goal of 44m/hr. Tolerating well.  Problem: Bowel/Gastric: Goal: Will not experience complications related to bowel motility Outcome: Progressing Patient still having C diff stools. Flexiseal collected 200 ml on day shift.

## 2016-08-18 ENCOUNTER — Inpatient Hospital Stay
Admission: RE | Admit: 2016-08-18 | Discharge: 2016-09-06 | Disposition: A | Discharge status: Skilled Nursing Facility | Payer: Medicare Other | Source: Ambulatory Visit | Attending: Internal Medicine | Admitting: Internal Medicine

## 2016-08-18 ENCOUNTER — Other Ambulatory Visit (HOSPITAL_COMMUNITY): Payer: Medicare Other

## 2016-08-18 DIAGNOSIS — Z4682 Encounter for fitting and adjustment of non-vascular catheter: Secondary | ICD-10-CM | POA: Diagnosis not present

## 2016-08-18 DIAGNOSIS — K567 Ileus, unspecified: Secondary | ICD-10-CM

## 2016-08-18 DIAGNOSIS — Z931 Gastrostomy status: Secondary | ICD-10-CM

## 2016-08-18 DIAGNOSIS — Z95828 Presence of other vascular implants and grafts: Secondary | ICD-10-CM

## 2016-08-18 DIAGNOSIS — J9690 Respiratory failure, unspecified, unspecified whether with hypoxia or hypercapnia: Secondary | ICD-10-CM

## 2016-08-18 LAB — BASIC METABOLIC PANEL
Anion gap: 4 — ABNORMAL LOW (ref 5–15)
BUN: 13 mg/dL (ref 6–20)
CALCIUM: 8 mg/dL — AB (ref 8.9–10.3)
CHLORIDE: 113 mmol/L — AB (ref 101–111)
CO2: 23 mmol/L (ref 22–32)
CREATININE: 1.29 mg/dL — AB (ref 0.44–1.00)
GFR calc non Af Amer: 43 mL/min — ABNORMAL LOW (ref >60)
GFR, EST AFRICAN AMERICAN: 50 mL/min — AB (ref >60)
Glucose, Bld: 183 mg/dL — ABNORMAL HIGH (ref 65–99)
Potassium: 3.5 mmol/L (ref 3.5–5.1)
SODIUM: 140 mmol/L (ref 135–145)

## 2016-08-18 LAB — GLUCOSE, CAPILLARY
GLUCOSE-CAPILLARY: 122 mg/dL — AB (ref 65–99)
GLUCOSE-CAPILLARY: 175 mg/dL — AB (ref 65–99)
Glucose-Capillary: 137 mg/dL — ABNORMAL HIGH (ref 65–99)
Glucose-Capillary: 129 mg/dL — ABNORMAL HIGH (ref 65–99)

## 2016-08-18 MED ORDER — VANCOMYCIN 50 MG/ML ORAL SOLUTION: 125.0000 mg | Freq: Two times a day (BID) | ORAL | 0 refills | Status: AC

## 2016-08-18 MED ORDER — VITAL AF 1.2 CAL PO LIQD: 1000.0000 mL | ORAL | 5 refills | Status: DC

## 2016-08-18 MED ORDER — VANCOMYCIN 50 MG/ML ORAL SOLUTION: 125.0000 mg | Freq: Three times a day (TID) | ORAL | 0 refills | Status: AC

## 2016-08-18 MED ORDER — VANCOMYCIN 50 MG/ML ORAL SOLUTION
125.0000 mg | Freq: Every day | ORAL | 0 refills | Status: AC
Start: 2016-09-12 — End: 2016-09-19

## 2016-08-18 MED ORDER — DIATRIZOATE MEGLUMINE & SODIUM 66-10 % PO SOLN
ORAL | Status: AC
  Administered 2016-08-18: 50 mL via GASTROSTOMY
  Filled 2016-08-18: qty 30

## 2016-08-18 MED ORDER — VANCOMYCIN 50 MG/ML ORAL SOLUTION: 125.0000 mg | Freq: Four times a day (QID) | ORAL | 0 refills | Status: DC

## 2016-08-18 NOTE — Progress Notes (Signed)
Assessment/ Plan:   1. Acute kidney injury recently on dialysis.  Improved. Last HD 11/13. Making urine,will cont to monitor off HD. OK to transfer to [facility] from [facility] perspective with renal f/u there for any additional dialysis need.  WIll need to remove dialysis catheter if no further dialysis or catheter need.  Weigh is up and IsOs unreliable as exam. 2. Recurrent SBO: per surgery  3. Sepsis, improved 4. Acute, now chronic Resp f: s/p trach  Plan: Cont to follow, weight up.  Give lasix prn.  I will sign off.  Please call prn.  Will defer to [facility] regarding HD catheter removal.  Subjective: Interval History: Incontinent of urine x 5 yesterday  Objective: Vital signs in last 24 hours: Temp:  [98.1 F (36.7 C)-99.2 F (37.3 C)] 99.2 F (37.3 C) (11/24 0800) Pulse Rate:  [87-105] 101 (11/24 0746) Resp:  [14-26] 16 (11/24 1000) BP: (115-151)/(73-87) 139/74 (11/24 1000) SpO2:  [100 %] 100 % (11/24 0746) FiO2 (%):  [28 %] 28 % (11/24 0746) Weight:  [103.3 kg (227 lb 11.8 oz)] 103.3 kg (227 lb 11.8 oz) (11/24 0431) Weight change: 1.8 kg (3 lb 15.5 oz)  Intake/Output from previous day: 11/23 0701 - 11/24 0700 In: 3895 [I.V.:2110; NG/GT:1470; IV Piggyback:50] Out: 385 [Stool:385] Intake/Output this shift: No intake/output data recorded.  General appearance: alert  Obese Trach TDC right chest  Lab Results:  Recent Labs  08/17/16 0630  WBC 9.8  HGB 7.9*  HCT 25.1*  PLT 274   BMET:  Recent Labs  08/16/16 0320 08/17/16 0630  NA 140 140  140  K 3.2* 3.6  3.6  CL 110 113*  113*  CO2 '23 23  24  '$ GLUCOSE 132* 204*  206*  BUN '16 15  15  '$ CREATININE 1.66* 1.53*  1.49*  CALCIUM 8.2* 8.3*  8.3*   No results for input(s): PTH in the last 72 hours. Iron Studies: No results for input(s): IRON, TIBC, TRANSFERRIN, FERRITIN in the last 72 hours. Studies/Results: Dg Abd 2 Views  Result Date: 08/16/2016 CLINICAL DATA:  65 year old female with a  history of small bowel obstruction EXAM: ABDOMEN - 2 VIEW COMPARISON:  Multiple prior plain film most recent 08/12/2016. CT 08/09/2016 FINDINGS: Since the prior plain film there has has been near complete evacuation of enteric contrast. Persisting borderline dilated small bowel loops particularly in the left abdomen. Paucity of colonic gas and rectal gas. Gastric tube is unchanged in position. Decubitus view demonstrates air-fluid levels within small bowel. Fibroid uterus. Pelvic phleboliths. Degenerative changes of the spine and of the right greater than left hips. IMPRESSION: Since the prior plain film there has been near complete evacuation of enteric contrast, however, persist mild dilation of the bowel loops with relative absence of colonic gas suggests persisting intermittent/partial small bowel obstruction. Gastric tube is unchanged in position. Signed, Dulcy Fanny. Earleen Newport, DO [facility] Radiology Electronically Signed   By: Corrie Mckusick D.O.   On: 08/16/2016 14:10    Scheduled: . aspirin  81 mg Per Tube Daily  . atorvastatin  40 mg Per Tube QHS  . budesonide  0.5 mg Nebulization BID  . chlorhexidine  15 mL Mouth Rinse BID  . darbepoetin (ARANESP) injection - NON-DIALYSIS  40 mcg Subcutaneous Q Sun-1800  . famotidine (PEPCID) IV  20 mg Intravenous Q24H  . feeding supplement (VITAL AF 1.2 CAL)  1,000 mL Per Tube Q24H  . heparin  5,000 Units Subcutaneous Q8H  .  insulin aspart  0-9 Units Subcutaneous Q4H  . mouth rinse  15 mL Mouth Rinse q12n4p  . methimazole  10 mg Per Tube Daily  . metoprolol tartrate  12.5 mg Per Tube BID  . nystatin   Topical BID  . polyvinyl alcohol  2 drop Both Eyes BID  . traMADol  50 mg Per Tube Q12H  . vancomycin  125 mg Oral Q6H   Followed by  . [START ON 08/29/2016] vancomycin  125 mg Oral Q8H   Followed by  . [START ON 09/05/2016] vancomycin  125 mg Oral Q12H   Followed by  . [START ON 09/12/2016] vancomycin  125  mg Oral Daily     LOS: 8 days   Mustafa Potts C 08/18/2016,11:58 AM

## 2016-08-18 NOTE — Progress Notes (Signed)
[facility] personal care items and trach supplies. Call made to daughter to let her know that pt arrived safely. Marland Kitchen

## 2016-08-18 NOTE — Progress Notes (Signed)
Family Medicine Teaching Service Daily Progress Note Intern Pager: [phone]  Patient name: Angelica Beck Medical record number: [id number] Date of birth: Oct 29, 1950 Age: 65 y.o. Gender: female  Primary Care Provider: Delia Chimes, NP Consultants: General Surgery, ID, Nephrology Code Status: Full Chief Complaint: None  Assessment and Plan: Angelica Beck is a 65 y.o. female presenting with SBO. PMH is significant for stroke (residual mild left sided weakness, non-ambulatory, bedbound), T2DM, HTN, hyperthyroidism, HLD, CAD, HFpEF, ARF 2/2 ATN on intermittent HD, s/p trach and G-tube 06/2016.   # Small Bowel Obstruction v ileus, resolved:   Treated conservatively, seemed to have resolved on its own.  Surgery indicates no surgical intervention necessary. - restarted tube feeds (11/21), tolerating at goal rate 65 ml/hr - dietetic following, appreciate recommendations - Pepcid 20 mg IV qd for SUP - Monitor I/Os - 385 ml stool out/24h  # Agitation, resolved  Patient alert today, comfortable, responds appropriately to questions with nod and shaking head. -  Will closely monitor mental status  # Healthcare Acquired Pneumonia, improving: Patient recently treated for HAP at last admission. CT this admission shows LLL PNA thought to be new, not just resolving from previous infection. Leukocytosis resolved. Concerns for aspiration PNA given copious secretions from trach. BCx NGTD.  - Completed 5/5 days Aztreonam (11/17 - 11/20) and ceftriaxone (11/21) - blood cultures NGx3D - appreciate ID recs  # H/o C-diff Infection, Acute on Chronic, stable: Patient recently treated after positive test on 10/10 with vanc PO at discharge. Unclear when abx ended. Positive on admission.  - S/p 4 days Flagyl IV 500 mg q8h (11/17 - 11/20) given NPO for SBO - Now restarted tube feeds: continue day #5 oral vanco '125mg'$  QID x 14 days (11/20 - 12/3) then plan for taper '125mg'$  TID x 1 week , then '125mg'$  BID x 1 week, then  '125mg'$  daily x 1 week. - appreciate ID recs  # Chronic Respiratory Failure with Trach, Stable:  Trach placed 10/20 at last hospitalization when patient had chronic hypoxic respiratory failure due to encephalopathy that prevented her from protecting her airway as well as obesity hypoventilation syndrome. - Rubinol helping with secretions   - Continue trach collar and routine trach care - Albuterol neb every 4 hours prn   # Acute Kidney Injury, Likely 2/2 ATN, improved:  Cr trended down  2.15>>>1.5(BL 1.6-1.8) Pt was on CRRT during last hospitalization, transitioned to intermittent HD, last 11/13.  FeNa 0.6% suggestive of prerenal etiology for AKI. - 24 Cr clearance low at 10 [ref 75-115] - Nephrology consulted- ok to d/c from their standpoint - Strict I/Os - Trend BMET  # Electrolyte Abnormalities, Resolved:  Hyponatremia resolved (138), hypokalemia on admission repleted and 3.6 yesterday.  - Strict I/Os   # Anemia of Chronic Disease, Improved: Hgb stable. Iron panel from 06/2016 with normal iron, low TIBC, and high ferritin.  # Diabetes Mellitus Type 2:  Last A1c 5.8%. Pt on Humalog sliding scale at home. CBG 60-175 overnight. - Sensitive SSI - will space q4 CBGs now tol tube feeds - can consider transitioning back to home med as tube feeds increased  # HFpEF: Last ECHO 06/21/16: EF 60-65%, G1DD. Takes Metoprolol tartrate '25mg'$  bid at home. Restarted metoprolol at reduced dose 12.5 mg BID for now. VSS overnight BP 76, BP controlled (144/79) this AM - continue to monitor, can increase metop to home dose as appropriate  # Hyperthyroidism: Pt taking Methimazole '10mg'$  daily. Last TSH 11/11 was 0.641, free T4 1.74. - Continue  Methimazole  # H/o CVA, Stable: Pt has residual left sided weakness and is non-ambulatory. Prior to her last hospitalization, she was living with her daughter. She was completely reliant on her family for ADLs and she was mostly bed bound. - Continue Aspirin and  Lipitor now tolerating tube feeds  FEN/GI:  G-tube drain to gravity, D5NS at 100 ml/hr Prophylaxis: Heparin SQ  Disposition: [facility]  Subjective:  [name] appears much more comfortable on exam this morning. Resting, not grimacing, seems to respond more appropriately to my questions. Denies stomach pain.  Objective: Temp:  [98.1 F (36.7 C)-98.6 F (37 C)] 98.6 F (37 C) (11/24 0400) Pulse Rate:  [87-105] 99 (11/24 0400) Resp:  [12-26] 14 (11/24 0400) BP: (115-151)/(73-93) 144/79 (11/24 0400) SpO2:  [100 %] 100 % (11/24 0400) FiO2 (%):  [28 %] 28 % (11/24 0400) Weight:  [103.3 kg (227 lb 11.8 oz)] 103.3 kg (227 lb 11.8 oz) (11/24 0431) Physical Exam: General: Chronically-ill appearing female lies in bed, nonverbal, trach in place, alert, in no acute distress Cardiovascular: RRR, no m/r/g Respiratory: no W/R/R Abdomen: soft, nt, nd, peg in place clean and dry, +draining brown fluid with gravity Extremities: warm and well-perfused, moves all extremities equally  Laboratory:  Recent Labs Lab 08/13/16 0700 08/15/16 0500 08/17/16 0630  WBC 14.3* 10.5 9.8  HGB 7.7* 8.3* 7.9*  HCT 23.9* 26.2* 25.1*  PLT 253 262 274    Recent Labs Lab 08/15/16 0500 08/16/16 0320 08/17/16 0630  NA 137 140 140  140  K 3.5 3.2* 3.6  3.6  CL 109 110 113*  113*  CO2 '23 23 23  24  '$ BUN 23* '16 15  15  '$ CREATININE 1.95* 1.66* 1.53*  1.49*  CALCIUM 8.1* 8.2* 8.3*  8.3*  GLUCOSE 142* 132* 204*  206*   C-diff: positive  Imaging/Diagnostic Tests: Dg Chest Port 1 View FINDINGS: Left PICC terminates at the cavoatrial junction. Right internal jugular central venous catheter terminates at the cavoatrial junction. Tracheostomy tube tip overlies the tracheal air column at the thoracic inlet. Stable cardiomediastinal silhouette with mild cardiomegaly. No pneumothorax. No pleural effusion. Low lung volumes. Vascular crowding without overt pulmonary edema. Mild left basilar opacity appears  stable.  IMPRESSION:  1. Left PICC terminates at the cavoatrial junction.  2. Low lung volumes. Vascular crowding without overt pulmonary edema.  3. Stable mild left basilar lung opacity, favor atelectasis.  4. Stable mild cardiomegaly.    Everrett Coombe, MD 08/18/2016, 7:16 AM PGY-1, [place] Intern pager: [phone], text pages welcome

## 2016-08-18 NOTE — Discharge Instructions (Signed)
You were admitted to the hospital with pneumonia which was treated with antibiotics. You also had a gastrointestinal infection. Please complete your antibiotic regimen for this as prescribed.  You were seen by nephrology, and surgery while hospitalized as well as the infectious disease doctors.  At the time of your discharge, all of your doctors agreed it was safe for you to go back to [facility].

## 2016-08-19 ENCOUNTER — Other Ambulatory Visit (HOSPITAL_COMMUNITY): Payer: Medicare Other

## 2016-08-19 DIAGNOSIS — R918 Other nonspecific abnormal finding of lung field: Secondary | ICD-10-CM | POA: Diagnosis not present

## 2016-08-19 LAB — COMPREHENSIVE METABOLIC PANEL
ALT: 9 U/L — AB (ref 14–54)
AST: 17 U/L (ref 15–41)
Albumin: 1.3 g/dL — ABNORMAL LOW (ref 3.5–5.0)
Alkaline Phosphatase: 63 U/L (ref 38–126)
Anion gap: 4 — ABNORMAL LOW (ref 5–15)
BILIRUBIN TOTAL: 0.3 mg/dL (ref 0.3–1.2)
BUN: 13 mg/dL (ref 6–20)
CALCIUM: 8.2 mg/dL — AB (ref 8.9–10.3)
CHLORIDE: 114 mmol/L — AB (ref 101–111)
CO2: 23 mmol/L (ref 22–32)
CREATININE: 1.22 mg/dL — AB (ref 0.44–1.00)
GFR, EST AFRICAN AMERICAN: 53 mL/min — AB (ref >60)
GFR, EST NON AFRICAN AMERICAN: 46 mL/min — AB (ref >60)
Glucose, Bld: 142 mg/dL — ABNORMAL HIGH (ref 65–99)
Potassium: 3.4 mmol/L — ABNORMAL LOW (ref 3.5–5.1)
Sodium: 141 mmol/L (ref 135–145)
TOTAL PROTEIN: 4.7 g/dL — AB (ref 6.5–8.1)

## 2016-08-19 LAB — CBC
HCT: 23.7 % — ABNORMAL LOW (ref 36.0–46.0)
Hemoglobin: 7.4 g/dL — ABNORMAL LOW (ref 12.0–15.0)
MCH: 27.5 pg (ref 26.0–34.0)
MCHC: 31.2 g/dL (ref 30.0–36.0)
MCV: 88.1 fL (ref 78.0–100.0)
PLATELETS: 277 10*3/uL (ref 150–400)
RBC: 2.69 MIL/uL — AB (ref 3.87–5.11)
RDW: 15.8 % — AB (ref 11.5–15.5)
WBC: 11.6 10*3/uL — AB (ref 4.0–10.5)

## 2016-08-20 LAB — BASIC METABOLIC PANEL
ANION GAP: 3 — AB (ref 5–15)
BUN: 11 mg/dL (ref 6–20)
CALCIUM: 8.5 mg/dL — AB (ref 8.9–10.3)
CO2: 25 mmol/L (ref 22–32)
Chloride: 114 mmol/L — ABNORMAL HIGH (ref 101–111)
Creatinine, Ser: 1.05 mg/dL — ABNORMAL HIGH (ref 0.44–1.00)
GFR, EST NON AFRICAN AMERICAN: 55 mL/min — AB (ref >60)
GLUCOSE: 126 mg/dL — AB (ref 65–99)
POTASSIUM: 4 mmol/L (ref 3.5–5.1)
Sodium: 142 mmol/L (ref 135–145)

## 2016-08-21 NOTE — Progress Notes (Signed)
Subjective:  Patient returned from [facility] Serum creatinine is down to 1.5 without dialysis No acute c/o   Objective:  Vital signs in last 24 hours:  Temperature  96.8, pulse 100, respirations 20, blood pressure 142/48  Physical Exam: General: Chronically ill appearing, NAD  HEENT Anicteric, moist oral mucus membranes  Neck Trach in place  Pulm/lungs normal effort, scattered rhonchi  CVS/Heart Regular, no rub  Abdomen:  PEG tube, soft NT ND,    Extremities: ++ edema, feet in soft supports  Neurologic: Awake, following simple commands  Skin: No acute rashes  Access: Rt IJ PC       Basic Metabolic Panel:   Recent Labs Lab 08/15/16 0500 08/15/16 0641 08/16/16 0320 08/16/16 0930 08/17/16 0630 08/18/16 1513 08/19/16 0628 08/20/16 0645  NA 137  --  140  --  140  140 140 141 142  K 3.5  --  3.2*  --  3.6  3.6 3.5 3.4* 4.0  CL 109  --  110  --  113*  113* 113* 114* 114*  CO2 23  --  23  --  '23  24 23 23 25  '$ GLUCOSE 142*  --  132*  --  204*  206* 183* 142* 126*  BUN 23*  --  16  --  '15  15 13 13 11  '$ CREATININE 1.95*  --  1.66*  --  1.53*  1.49* 1.29* 1.22* 1.05*  CALCIUM 8.1*  --  8.2*  --  8.3*  8.3* 8.0* 8.2* 8.5*  MG  --  1.9  --  1.5* 1.6*  --   --   --   PHOS 2.6  --  2.8  --  2.7  --   --   --      CBC:  Recent Labs Lab 08/15/16 0500 08/17/16 0630 08/19/16 0628  WBC 10.5 9.8 11.6*  HGB 8.3* 7.9* 7.4*  HCT 26.2* 25.1* 23.7*  MCV 86.2 87.5 88.1  PLT 262 274 277      Microbiology:  Recent Results (from the past 720 hour(s))  C difficile quick scan w PCR reflex     Status: Abnormal   Collection Time: 08/11/16  1:27 AM  Result Value Ref Range Status   C Diff antigen POSITIVE (A) NEGATIVE Final   C Diff toxin POSITIVE (A) NEGATIVE Final   C Diff interpretation Toxin producing C. difficile detected.  Final    Comment: CRITICAL RESULT CALLED TO, READ BACK BY AND VERIFIED WITH: ANDY BRAKE,RN '@0703'$  08/11/16 MKELLY,MLT   Surgical pcr screen      Status: None   Collection Time: 08/11/16  1:28 AM  Result Value Ref Range Status   MRSA, PCR NEGATIVE NEGATIVE Final   Staphylococcus aureus NEGATIVE NEGATIVE Final    Comment:        The Xpert SA Assay (FDA approved for NASAL specimens in patients over 79 years of age), is one component of a comprehensive surveillance program.  Test performance has been validated by [facility] for patients greater than or equal to 64 year old. It is not intended to diagnose infection nor to guide or monitor treatment.   Culture, blood (routine x 2)     Status: None   Collection Time: 08/11/16  4:07 AM  Result Value Ref Range Status   Specimen Description BLOOD RIGHT ARM  Final   Special Requests BOTTLES DRAWN AEROBIC AND ANAEROBIC 4ML  Final   Culture NO GROWTH 5 DAYS  Final   Report  Status 08/16/2016 FINAL  Final  Culture, blood (routine x 2)     Status: None   Collection Time: 08/11/16  4:14 AM  Result Value Ref Range Status   Specimen Description BLOOD RIGHT ARM  Final   Special Requests IN PEDIATRIC BOTTLE 3ML  Final   Culture NO GROWTH 5 DAYS  Final   Report Status 08/16/2016 FINAL  Final  Culture, blood (single)     Status: None   Collection Time: 08/11/16  8:40 AM  Result Value Ref Range Status   Specimen Description BLOOD PICC LINE  Final   Special Requests BOTTLES DRAWN AEROBIC AND ANAEROBIC 5CC  Final   Culture NO GROWTH 5 DAYS  Final   Report Status 08/16/2016 FINAL  Final  Culture, respiratory (NON-Expectorated)     Status: None   Collection Time: 08/11/16  9:10 AM  Result Value Ref Range Status   Specimen Description TRACHEAL ASPIRATE  Final   Special Requests NONE  Final   Gram Stain   Final    RARE WBC PRESENT,BOTH PMN AND MONONUCLEAR FEW SQUAMOUS EPITHELIAL CELLS PRESENT MODERATE GRAM POSITIVE RODS FEW GRAM POSITIVE COCCI IN PAIRS FEW YEAST    Culture FEW ESCHERICHIA COLI  Final   Report Status 08/14/2016 FINAL  Final   Organism ID, Bacteria ESCHERICHIA COLI   Final      Susceptibility   Escherichia coli - MIC*    AMPICILLIN >=32 RESISTANT Resistant     CEFAZOLIN <=4 SENSITIVE Sensitive     CEFEPIME <=1 SENSITIVE Sensitive     CEFTAZIDIME <=1 SENSITIVE Sensitive     CEFTRIAXONE <=1 SENSITIVE Sensitive     CIPROFLOXACIN 2 INTERMEDIATE Intermediate     GENTAMICIN <=1 SENSITIVE Sensitive     IMIPENEM <=0.25 SENSITIVE Sensitive     TRIMETH/SULFA >=320 RESISTANT Resistant     AMPICILLIN/SULBACTAM 16 INTERMEDIATE Intermediate     Extended ESBL NEGATIVE Sensitive     * FEW ESCHERICHIA COLI    Coagulation Studies: No results for input(s): LABPROT, INR in the last 72 hours.  Urinalysis: No results for input(s): COLORURINE, LABSPEC, PHURINE, GLUCOSEU, HGBUR, BILIRUBINUR, KETONESUR, PROTEINUR, UROBILINOGEN, NITRITE, LEUKOCYTESUR in the last 72 hours.  Invalid input(s): APPERANCEUR    Imaging: No results found.   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 Acute renal failure  Likely secondary to ATN from septic shock CRRT initially started on September 30. Subsequently, Transitioned to intermittent hemodialysis. - according to transfer notes, last hemodialysis was 11/13 - if serum creatinine remains stable, we will order PermCath removal this week  2. Acute respiratory failure -  Patient appears to be doing well on trach collar. Continue to monitor respiratory status closely.    3. Leg  Edema - likely  3rd spacing due to low albumin - start scheduled lasix - May discontinue amlodipine if SBP < 120     LOS: 0 Saben Donigan 11/27/20174:08 PM

## 2016-08-22 LAB — CBC WITH DIFFERENTIAL/PLATELET
BASOS ABS: 0 10*3/uL (ref 0.0–0.1)
BASOS PCT: 0 %
EOS PCT: 1 %
Eosinophils Absolute: 0.1 10*3/uL (ref 0.0–0.7)
HCT: 23.2 % — ABNORMAL LOW (ref 36.0–46.0)
Hemoglobin: 7.4 g/dL — ABNORMAL LOW (ref 12.0–15.0)
LYMPHS PCT: 17 %
Lymphs Abs: 1.8 10*3/uL (ref 0.7–4.0)
MCH: 28.5 pg (ref 26.0–34.0)
MCHC: 31.9 g/dL (ref 30.0–36.0)
MCV: 89.2 fL (ref 78.0–100.0)
MONO ABS: 0.7 10*3/uL (ref 0.1–1.0)
Monocytes Relative: 7 %
NEUTROS ABS: 8.2 10*3/uL — AB (ref 1.7–7.7)
Neutrophils Relative %: 75 %
PLATELETS: 265 10*3/uL (ref 150–400)
RBC: 2.6 MIL/uL — AB (ref 3.87–5.11)
RDW: 17 % — AB (ref 11.5–15.5)
WBC: 10.9 10*3/uL — AB (ref 4.0–10.5)

## 2016-08-22 LAB — RENAL FUNCTION PANEL
ALBUMIN: 1.3 g/dL — AB (ref 3.5–5.0)
ANION GAP: 5 (ref 5–15)
BUN: 12 mg/dL (ref 6–20)
CALCIUM: 8.8 mg/dL — AB (ref 8.9–10.3)
CO2: 26 mmol/L (ref 22–32)
CREATININE: 0.99 mg/dL (ref 0.44–1.00)
Chloride: 108 mmol/L (ref 101–111)
GFR calc Af Amer: >60 mL/min (ref >60)
GFR calc non Af Amer: 59 mL/min — ABNORMAL LOW (ref >60)
GLUCOSE: 148 mg/dL — AB (ref 65–99)
PHOSPHORUS: 2.8 mg/dL (ref 2.5–4.6)
Potassium: 4.2 mmol/L (ref 3.5–5.1)
SODIUM: 139 mmol/L (ref 135–145)

## 2016-08-22 LAB — TSH: TSH: 2.277 u[IU]/mL (ref 0.350–4.500)

## 2016-08-22 LAB — MAGNESIUM: Magnesium: 1.2 mg/dL — ABNORMAL LOW (ref 1.7–2.4)

## 2016-08-23 ENCOUNTER — Other Ambulatory Visit (HOSPITAL_COMMUNITY): Payer: Medicare Other

## 2016-08-23 ENCOUNTER — Encounter (HOSPITAL_COMMUNITY): Payer: Self-pay | Admitting: Interventional Radiology

## 2016-08-23 DIAGNOSIS — Z4901 Encounter for fitting and adjustment of extracorporeal dialysis catheter: Secondary | ICD-10-CM | POA: Diagnosis not present

## 2016-08-23 HISTORY — PX: IR GENERIC HISTORICAL: IMG1180011

## 2016-08-23 LAB — BASIC METABOLIC PANEL
Anion gap: 7 (ref 5–15)
BUN: 11 mg/dL (ref 6–20)
CALCIUM: 8.8 mg/dL — AB (ref 8.9–10.3)
CO2: 27 mmol/L (ref 22–32)
Chloride: 104 mmol/L (ref 101–111)
Creatinine, Ser: 0.95 mg/dL (ref 0.44–1.00)
GFR calc Af Amer: >60 mL/min (ref >60)
GLUCOSE: 150 mg/dL — AB (ref 65–99)
Potassium: 4.3 mmol/L (ref 3.5–5.1)
Sodium: 138 mmol/L (ref 135–145)

## 2016-08-23 LAB — MAGNESIUM: MAGNESIUM: 1.9 mg/dL (ref 1.7–2.4)

## 2016-08-23 MED ORDER — CHLORHEXIDINE GLUCONATE 4 % EX LIQD
CUTANEOUS | Status: AC
  Filled 2016-08-23: qty 15

## 2016-08-23 MED ORDER — LIDOCAINE HCL 1 % IJ SOLN
INTRAMUSCULAR | Status: AC
  Filled 2016-08-23: qty 20

## 2016-08-23 NOTE — Progress Notes (Signed)
Subjective:  Patient returned from [facility] Serum creatinine is down to 0.95 without dialysis No acute c/o   Objective:  Vital signs in last 24 hours:  Temperature  Temperature 96.7, pulse 100, respirations 18, blood pressure 177/33  Physical Exam: General: Chronically ill appearing, NAD  HEENT Anicteric, moist oral mucus membranes  Neck Trach in place  Pulm/lungs normal effort, scattered rhonchi  CVS/Heart Regular, no rub  Abdomen:  PEG tube, soft NT ND,    Extremities: ++ edema, feet in soft supports  Neurologic: Awake, following simple commands  Skin: No acute rashes  Access: Rt IJ PC       Basic Metabolic Panel:   Recent Labs Lab 08/17/16 0630 08/18/16 1513 08/19/16 0628 08/20/16 0645 08/22/16 0653 08/23/16 0410  NA 140  140 140 141 142 139 138  K 3.6  3.6 3.5 3.4* 4.0 4.2 4.3  CL 113*  113* 113* 114* 114* 108 104  CO2 '23  24 23 23 25 26 27  '$ GLUCOSE 204*  206* 183* 142* 126* 148* 150*  BUN '15  15 13 13 11 12 11  '$ CREATININE 1.53*  1.49* 1.29* 1.22* 1.05* 0.99 0.95  CALCIUM 8.3*  8.3* 8.0* 8.2* 8.5* 8.8* 8.8*  MG 1.6*  --   --   --  1.2* 1.9  PHOS 2.7  --   --   --  2.8  --      CBC:  Recent Labs Lab 08/17/16 0630 08/19/16 0628 08/22/16 0653  WBC 9.8 11.6* 10.9*  NEUTROABS  --   --  8.2*  HGB 7.9* 7.4* 7.4*  HCT 25.1* 23.7* 23.2*  MCV 87.5 88.1 89.2  PLT 274 277 265      Microbiology:  Recent Results (from the past 720 hour(s))  C difficile quick scan w PCR reflex     Status: Abnormal   Collection Time: 08/11/16  1:27 AM  Result Value Ref Range Status   C Diff antigen POSITIVE (A) NEGATIVE Final   C Diff toxin POSITIVE (A) NEGATIVE Final   C Diff interpretation Toxin producing C. difficile detected.  Final    Comment: CRITICAL RESULT CALLED TO, READ BACK BY AND VERIFIED WITH: ANDY BRAKE,RN '@0703'$  08/11/16 MKELLY,MLT   Surgical pcr screen     Status: None   Collection Time: 08/11/16  1:28 AM  Result Value Ref Range Status   MRSA, PCR NEGATIVE NEGATIVE Final   Staphylococcus aureus NEGATIVE NEGATIVE Final    Comment:        The Xpert SA Assay (FDA approved for NASAL specimens in patients over 89 years of age), is one component of a comprehensive surveillance program.  Test performance has been validated by [facility] for patients greater than or equal to 44 year old. It is not intended to diagnose infection nor to guide or monitor treatment.   Culture, blood (routine x 2)     Status: None   Collection Time: 08/11/16  4:07 AM  Result Value Ref Range Status   Specimen Description BLOOD RIGHT ARM  Final   Special Requests BOTTLES DRAWN AEROBIC AND ANAEROBIC 4ML  Final   Culture NO GROWTH 5 DAYS  Final   Report Status 08/16/2016 FINAL  Final  Culture, blood (routine x 2)     Status: None   Collection Time: 08/11/16  4:14 AM  Result Value Ref Range Status   Specimen Description BLOOD RIGHT ARM  Final   Special Requests IN PEDIATRIC BOTTLE 3ML  Final   Culture  NO GROWTH 5 DAYS  Final   Report Status 08/16/2016 FINAL  Final  Culture, blood (single)     Status: None   Collection Time: 08/11/16  8:40 AM  Result Value Ref Range Status   Specimen Description BLOOD PICC LINE  Final   Special Requests BOTTLES DRAWN AEROBIC AND ANAEROBIC 5CC  Final   Culture NO GROWTH 5 DAYS  Final   Report Status 08/16/2016 FINAL  Final  Culture, respiratory (NON-Expectorated)     Status: None   Collection Time: 08/11/16  9:10 AM  Result Value Ref Range Status   Specimen Description TRACHEAL ASPIRATE  Final   Special Requests NONE  Final   Gram Stain   Final    RARE WBC PRESENT,BOTH PMN AND MONONUCLEAR FEW SQUAMOUS EPITHELIAL CELLS PRESENT MODERATE GRAM POSITIVE RODS FEW GRAM POSITIVE COCCI IN PAIRS FEW YEAST    Culture FEW ESCHERICHIA COLI  Final   Report Status 08/14/2016 FINAL  Final   Organism ID, Bacteria ESCHERICHIA COLI  Final      Susceptibility   Escherichia coli - MIC*    AMPICILLIN >=32 RESISTANT  Resistant     CEFAZOLIN <=4 SENSITIVE Sensitive     CEFEPIME <=1 SENSITIVE Sensitive     CEFTAZIDIME <=1 SENSITIVE Sensitive     CEFTRIAXONE <=1 SENSITIVE Sensitive     CIPROFLOXACIN 2 INTERMEDIATE Intermediate     GENTAMICIN <=1 SENSITIVE Sensitive     IMIPENEM <=0.25 SENSITIVE Sensitive     TRIMETH/SULFA >=320 RESISTANT Resistant     AMPICILLIN/SULBACTAM 16 INTERMEDIATE Intermediate     Extended ESBL NEGATIVE Sensitive     * FEW ESCHERICHIA COLI    Coagulation Studies: No results for input(s): LABPROT, INR in the last 72 hours.  Urinalysis: No results for input(s): COLORURINE, LABSPEC, PHURINE, GLUCOSEU, HGBUR, BILIRUBINUR, KETONESUR, PROTEINUR, UROBILINOGEN, NITRITE, LEUKOCYTESUR in the last 72 hours.  Invalid input(s): APPERANCEUR    Imaging: No results found.   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 Acute renal failure  Likely secondary to ATN from septic shock CRRT initially started on September 30. Subsequently, Transitioned to intermittent hemodialysis. - according to transfer notes, last hemodialysis was 11/13 - PermCath removal this week  2. Acute respiratory failure -  Patient appears to be doing well on trach collar. Continue to monitor respiratory status closely.    3. Leg  Edema - likely  3rd spacing due to low albumin -  scheduled lasix - May discontinue amlodipine if SBP < 120     LOS: 0 [name] 11/29/20179:54 AM

## 2016-08-25 LAB — BASIC METABOLIC PANEL
Anion gap: 9 (ref 5–15)
BUN: 11 mg/dL (ref 6–20)
CALCIUM: 8.8 mg/dL — AB (ref 8.9–10.3)
CHLORIDE: 96 mmol/L — AB (ref 101–111)
CO2: 29 mmol/L (ref 22–32)
CREATININE: 0.89 mg/dL (ref 0.44–1.00)
GFR calc Af Amer: >60 mL/min (ref >60)
GFR calc non Af Amer: >60 mL/min (ref >60)
Glucose, Bld: 176 mg/dL — ABNORMAL HIGH (ref 65–99)
Potassium: 4.2 mmol/L (ref 3.5–5.1)
SODIUM: 134 mmol/L — AB (ref 135–145)

## 2016-08-25 NOTE — Progress Notes (Signed)
Subjective:  Patient has no c/o this morning Cr 0.95, K 4.3 Still has some LE edema   Objective:  Vital signs in last 24 hours:  Temperature  Temperature 97.3, pulse 88,  respirations 17, blood pressure 150/79  Physical Exam: General: Chronically ill appearing, NAD  HEENT Anicteric, moist oral mucus membranes  Neck Trach in place  Pulm/lungs normal effort, scattered rhonchi  CVS/Heart Regular, no rub  Abdomen:  PEG tube, soft NT ND,    Extremities: ++ edema, feet in soft supports  Neurologic: Awake, following simple commands  Skin: No acute rashes          Basic Metabolic Panel:   Recent Labs Lab 08/18/16 1513 08/19/16 0628 08/20/16 0645 08/22/16 0653 08/23/16 0410  NA 140 141 142 139 138  K 3.5 3.4* 4.0 4.2 4.3  CL 113* 114* 114* 108 104  CO2 '23 23 25 26 27  '$ GLUCOSE 183* 142* 126* 148* 150*  BUN '13 13 11 12 11  '$ CREATININE 1.29* 1.22* 1.05* 0.99 0.95  CALCIUM 8.0* 8.2* 8.5* 8.8* 8.8*  MG  --   --   --  1.2* 1.9  PHOS  --   --   --  2.8  --      CBC:  Recent Labs Lab 08/19/16 0628 08/22/16 0653  WBC 11.6* 10.9*  NEUTROABS  --  8.2*  HGB 7.4* 7.4*  HCT 23.7* 23.2*  MCV 88.1 89.2  PLT 277 265      Microbiology:  Recent Results (from the past 720 hour(s))  C difficile quick scan w PCR reflex     Status: Abnormal   Collection Time: 08/11/16  1:27 AM  Result Value Ref Range Status   C Diff antigen POSITIVE (A) NEGATIVE Final   C Diff toxin POSITIVE (A) NEGATIVE Final   C Diff interpretation Toxin producing C. difficile detected.  Final    Comment: CRITICAL RESULT CALLED TO, READ BACK BY AND VERIFIED WITH: ANDY BRAKE,RN '@0703'$  08/11/16 MKELLY,MLT   Surgical pcr screen     Status: None   Collection Time: 08/11/16  1:28 AM  Result Value Ref Range Status   MRSA, PCR NEGATIVE NEGATIVE Final   Staphylococcus aureus NEGATIVE NEGATIVE Final    Comment:        The Xpert SA Assay (FDA approved for NASAL specimens in patients over 21 years of  age), is one component of a comprehensive surveillance program.  Test performance has been validated by [facility] for patients greater than or equal to 81 year old. It is not intended to diagnose infection nor to guide or monitor treatment.   Culture, blood (routine x 2)     Status: None   Collection Time: 08/11/16  4:07 AM  Result Value Ref Range Status   Specimen Description BLOOD RIGHT ARM  Final   Special Requests BOTTLES DRAWN AEROBIC AND ANAEROBIC 4ML  Final   Culture NO GROWTH 5 DAYS  Final   Report Status 08/16/2016 FINAL  Final  Culture, blood (routine x 2)     Status: None   Collection Time: 08/11/16  4:14 AM  Result Value Ref Range Status   Specimen Description BLOOD RIGHT ARM  Final   Special Requests IN PEDIATRIC BOTTLE 3ML  Final   Culture NO GROWTH 5 DAYS  Final   Report Status 08/16/2016 FINAL  Final  Culture, blood (single)     Status: None   Collection Time: 08/11/16  8:40 AM  Result Value Ref Range Status  Specimen Description BLOOD PICC LINE  Final   Special Requests BOTTLES DRAWN AEROBIC AND ANAEROBIC 5CC  Final   Culture NO GROWTH 5 DAYS  Final   Report Status 08/16/2016 FINAL  Final  Culture, respiratory (NON-Expectorated)     Status: None   Collection Time: 08/11/16  9:10 AM  Result Value Ref Range Status   Specimen Description TRACHEAL ASPIRATE  Final   Special Requests NONE  Final   Gram Stain   Final    RARE WBC PRESENT,BOTH PMN AND MONONUCLEAR FEW SQUAMOUS EPITHELIAL CELLS PRESENT MODERATE GRAM POSITIVE RODS FEW GRAM POSITIVE COCCI IN PAIRS FEW YEAST    Culture FEW ESCHERICHIA COLI  Final   Report Status 08/14/2016 FINAL  Final   Organism ID, Bacteria ESCHERICHIA COLI  Final      Susceptibility   Escherichia coli - MIC*    AMPICILLIN >=32 RESISTANT Resistant     CEFAZOLIN <=4 SENSITIVE Sensitive     CEFEPIME <=1 SENSITIVE Sensitive     CEFTAZIDIME <=1 SENSITIVE Sensitive     CEFTRIAXONE <=1 SENSITIVE Sensitive     CIPROFLOXACIN 2  INTERMEDIATE Intermediate     GENTAMICIN <=1 SENSITIVE Sensitive     IMIPENEM <=0.25 SENSITIVE Sensitive     TRIMETH/SULFA >=320 RESISTANT Resistant     AMPICILLIN/SULBACTAM 16 INTERMEDIATE Intermediate     Extended ESBL NEGATIVE Sensitive     * FEW ESCHERICHIA COLI    Coagulation Studies: No results for input(s): LABPROT, INR in the last 72 hours.  Urinalysis: No results for input(s): COLORURINE, LABSPEC, PHURINE, GLUCOSEU, HGBUR, BILIRUBINUR, KETONESUR, PROTEINUR, UROBILINOGEN, NITRITE, LEUKOCYTESUR in the last 72 hours.  Invalid input(s): APPERANCEUR    Imaging: Ir Removal Tun Cv Cath W/o Fl  Result Date: 08/23/2016 INDICATION: History of renal failure. The patient requires removal of a tunneled dialysis catheter. No concern for infection. EXAM: REMOVAL OF TUNNELED CENTRAL VENOUS CATHETER MEDICATIONS: None. ANESTHESIA/SEDATION: None. FLUOROSCOPY TIME:  Fluoroscopy Time: 0 minutes 0 seconds (0 mGy). COMPLICATIONS: None immediate. PROCEDURE: The right chest dialysis catheter site was prepped with chlorhexidine. A sterile gown and gloves were worn during the procedure. Local anesthesia was provided with 1% lidocaine. Utilizing sharp and blunt dissection, the subcutaneous cuff of the dialysis catheter was freed. The catheter was then successfully removed in its entirety. A sterile dressing was applied over the catheter exit site. IMPRESSION: Removal of tunneled dialysis catheter utilizing sharp and blunt dissection. Electronically Signed   By: [name] M.D.   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 Acute  renal failure  Likely secondary to ATN from septic shock CRRT initially started on September 30. Subsequently, Transitioned to intermittent hemodialysis. - according to transfer notes, last hemodialysis was 11/13 - PermCath removal this week  2. Acute respiratory failure -  Patient continued on trach collar. Continue to monitor respiratory status closely.    3. Leg  Edema - likely  3rd spacing due to low albumin -  scheduled lasix - May discontinue amlodipine if SBP < 120     LOS: 0 Harneet Noblett 12/1/20179:42 AM

## 2016-08-28 ENCOUNTER — Other Ambulatory Visit (HOSPITAL_COMMUNITY): Payer: Medicare Other

## 2016-08-28 DIAGNOSIS — K567 Ileus, unspecified: Secondary | ICD-10-CM | POA: Diagnosis not present

## 2016-08-28 LAB — CBC WITH DIFFERENTIAL/PLATELET
Basophils Absolute: 0.1 10*3/uL (ref 0.0–0.1)
Basophils Relative: 1 %
Eosinophils Absolute: 0.1 10*3/uL (ref 0.0–0.7)
Eosinophils Relative: 2 %
HCT: 23.5 % — ABNORMAL LOW (ref 36.0–46.0)
HEMOGLOBIN: 7.6 g/dL — AB (ref 12.0–15.0)
LYMPHS ABS: 1.8 10*3/uL (ref 0.7–4.0)
LYMPHS PCT: 25 %
MCH: 28.3 pg (ref 26.0–34.0)
MCHC: 32.3 g/dL (ref 30.0–36.0)
MCV: 87.4 fL (ref 78.0–100.0)
Monocytes Absolute: 0.9 10*3/uL (ref 0.1–1.0)
Monocytes Relative: 13 %
NEUTROS ABS: 4.4 10*3/uL (ref 1.7–7.7)
NEUTROS PCT: 59 %
Platelets: 298 10*3/uL (ref 150–400)
RBC: 2.69 MIL/uL — AB (ref 3.87–5.11)
RDW: 16.7 % — ABNORMAL HIGH (ref 11.5–15.5)
WBC: 7.4 10*3/uL (ref 4.0–10.5)

## 2016-08-28 LAB — RENAL FUNCTION PANEL
ALBUMIN: 1.8 g/dL — AB (ref 3.5–5.0)
Anion gap: 8 (ref 5–15)
BUN: 11 mg/dL (ref 6–20)
CHLORIDE: 95 mmol/L — AB (ref 101–111)
CO2: 30 mmol/L (ref 22–32)
CREATININE: 1.02 mg/dL — AB (ref 0.44–1.00)
Calcium: 9 mg/dL (ref 8.9–10.3)
GFR, EST NON AFRICAN AMERICAN: 57 mL/min — AB (ref >60)
Glucose, Bld: 174 mg/dL — ABNORMAL HIGH (ref 65–99)
PHOSPHORUS: 4.2 mg/dL (ref 2.5–4.6)
POTASSIUM: 5.2 mmol/L — AB (ref 3.5–5.1)
Sodium: 133 mmol/L — ABNORMAL LOW (ref 135–145)

## 2016-08-28 LAB — MAGNESIUM: Magnesium: 1.3 mg/dL — ABNORMAL LOW (ref 1.7–2.4)

## 2016-08-28 NOTE — Progress Notes (Signed)
Subjective:  Patient is somewhat confused Shaking her head no - did not answer any questions Per staff mental status changes were noted since Saturday    Objective:  Vital signs in last 24 hours:  Temperature 96.7, pulse 68,  respirations 16, blood pressure 132/72  Physical Exam: General: Chronically ill appearing, NAD  HEENT Anicteric, moist oral mucus membranes  Neck Trach in place  Pulm/lungs normal effort, scattered rhonchi  CVS/Heart Regular, no rub  Abdomen:  PEG tube, soft NT ND,    Extremities: ++ edema, feet in soft supports  Neurologic: Not following commands  Skin: No acute rashes          Basic Metabolic Panel:   Recent Labs Lab 08/22/16 0653 08/23/16 0410 08/25/16 0642 08/28/16 0626  NA 139 138 134* 133*  K 4.2 4.3 4.2 5.2*  CL 108 104 96* 95*  CO2 '26 27 29 30  '$ GLUCOSE 148* 150* 176* 174*  BUN '12 11 11 11  '$ CREATININE 0.99 0.95 0.89 1.02*  CALCIUM 8.8* 8.8* 8.8* 9.0  MG 1.2* 1.9  --  1.3*  PHOS 2.8  --   --  4.2     CBC:  Recent Labs Lab 08/22/16 0653 08/28/16 0626  WBC 10.9* 7.4  NEUTROABS 8.2* 4.4  HGB 7.4* 7.6*  HCT 23.2* 23.5*  MCV 89.2 87.4  PLT 265 298      Microbiology:  Recent Results (from the past 720 hour(s))  C difficile quick scan w PCR reflex     Status: Abnormal   Collection Time: 08/11/16  1:27 AM  Result Value Ref Range Status   C Diff antigen POSITIVE (A) NEGATIVE Final   C Diff toxin POSITIVE (A) NEGATIVE Final   C Diff interpretation Toxin producing C. difficile detected.  Final    Comment: CRITICAL RESULT CALLED TO, READ BACK BY AND VERIFIED WITH: ANDY BRAKE,RN '@0703'$  08/11/16 MKELLY,MLT   Surgical pcr screen     Status: None   Collection Time: 08/11/16  1:28 AM  Result Value Ref Range Status   MRSA, PCR NEGATIVE NEGATIVE Final   Staphylococcus aureus NEGATIVE NEGATIVE Final    Comment:        The Xpert SA Assay (FDA approved for NASAL specimens in patients over 36 years of age), is one component  of a comprehensive surveillance program.  Test performance has been validated by [facility] for patients greater than or equal to 44 year old. It is not intended to diagnose infection nor to guide or monitor treatment.   Culture, blood (routine x 2)     Status: None   Collection Time: 08/11/16  4:07 AM  Result Value Ref Range Status   Specimen Description BLOOD RIGHT ARM  Final   Special Requests BOTTLES DRAWN AEROBIC AND ANAEROBIC 4ML  Final   Culture NO GROWTH 5 DAYS  Final   Report Status 08/16/2016 FINAL  Final  Culture, blood (routine x 2)     Status: None   Collection Time: 08/11/16  4:14 AM  Result Value Ref Range Status   Specimen Description BLOOD RIGHT ARM  Final   Special Requests IN PEDIATRIC BOTTLE 3ML  Final   Culture NO GROWTH 5 DAYS  Final   Report Status 08/16/2016 FINAL  Final  Culture, blood (single)     Status: None   Collection Time: 08/11/16  8:40 AM  Result Value Ref Range Status   Specimen Description BLOOD PICC LINE  Final   Special Requests BOTTLES DRAWN AEROBIC AND  ANAEROBIC 5CC  Final   Culture NO GROWTH 5 DAYS  Final   Report Status 08/16/2016 FINAL  Final  Culture, respiratory (NON-Expectorated)     Status: None   Collection Time: 08/11/16  9:10 AM  Result Value Ref Range Status   Specimen Description TRACHEAL ASPIRATE  Final   Special Requests NONE  Final   Gram Stain   Final    RARE WBC PRESENT,BOTH PMN AND MONONUCLEAR FEW SQUAMOUS EPITHELIAL CELLS PRESENT MODERATE GRAM POSITIVE RODS FEW GRAM POSITIVE COCCI IN PAIRS FEW YEAST    Culture FEW ESCHERICHIA COLI  Final   Report Status 08/14/2016 FINAL  Final   Organism ID, Bacteria ESCHERICHIA COLI  Final      Susceptibility   Escherichia coli - MIC*    AMPICILLIN >=32 RESISTANT Resistant     CEFAZOLIN <=4 SENSITIVE Sensitive     CEFEPIME <=1 SENSITIVE Sensitive     CEFTAZIDIME <=1 SENSITIVE Sensitive     CEFTRIAXONE <=1 SENSITIVE Sensitive     CIPROFLOXACIN 2 INTERMEDIATE Intermediate      GENTAMICIN <=1 SENSITIVE Sensitive     IMIPENEM <=0.25 SENSITIVE Sensitive     TRIMETH/SULFA >=320 RESISTANT Resistant     AMPICILLIN/SULBACTAM 16 INTERMEDIATE Intermediate     Extended ESBL NEGATIVE Sensitive     * FEW ESCHERICHIA COLI    Coagulation Studies: No results for input(s): LABPROT, INR in the last 72 hours.  Urinalysis: No results for input(s): COLORURINE, LABSPEC, PHURINE, GLUCOSEU, HGBUR, BILIRUBINUR, KETONESUR, PROTEINUR, UROBILINOGEN, NITRITE, LEUKOCYTESUR in the last 72 hours.  Invalid input(s): APPERANCEUR    Imaging: Dg Abd Portable 1v  Result Date: 08/28/2016 CLINICAL DATA:  Ileus, shortness of breath. EXAM: PORTABLE ABDOMEN - 1 VIEW COMPARISON:  KUB of August 18, 2016 FINDINGS: There is a small amount of gas within the stomach and a gastrostomy tube is present. There is a moderate amount of gas within minimally distended small bowel loops throughout the abdomen. There is gas in the ascending and descending portions of the colon. No rectal gas is observed. There is a large coarse calcification in the pelvis compatible with fibroid. Other smaller calcifications are present that likely reflect phleboliths. There are degenerative changes of the lower lumbar disc levels. IMPRESSION: The bowel gas pattern consistent with an ileus. No obstructive pattern is observed. Electronically Signed   By: David  Martinique M.D.   On: 08/28/2016 07:28     Medications:       Assessment/ Plan:  65 y.o.African American  female with history of stroke, left-sided weakness, CT with chronic small vessel ischemic changes, chronic lacunar infarct left basal ganglia, chronic ischemia the right side of pons, diastolic dysfunction with normal EF of 60-65%, diabetes, chronic kidney disease, chronic urinary tract infections due to incontinence  1. Acute renal failure  Likely secondary to ATN from septic shock. CRRT initially started on September 30. Subsequently, Transitioned to intermittent  hemodialysis. Last hemodialysis was 11/13 - No further need for HD is anticipated   2. Acute respiratory failure -  Lurline Idol is capped   3. Leg  Edema - likely  3rd spacing due to low albumin -  scheduled lasix 40 BID - May discontinue amlodipine if SBP < 120  4. Altered mental status\ - work up is in progress    LOS: 0 Angelica Beck 12/4/20179:53 AM

## 2016-08-29 ENCOUNTER — Other Ambulatory Visit (HOSPITAL_COMMUNITY): Payer: Medicare Other

## 2016-08-30 LAB — RENAL FUNCTION PANEL
Albumin: 2.1 g/dL — ABNORMAL LOW (ref 3.5–5.0)
Anion gap: 6 (ref 5–15)
BUN: 18 mg/dL (ref 6–20)
CALCIUM: 9.3 mg/dL (ref 8.9–10.3)
CO2: 30 mmol/L (ref 22–32)
CREATININE: 1.34 mg/dL — AB (ref 0.44–1.00)
Chloride: 97 mmol/L — ABNORMAL LOW (ref 101–111)
GFR, EST AFRICAN AMERICAN: 48 mL/min — AB (ref >60)
GFR, EST NON AFRICAN AMERICAN: 41 mL/min — AB (ref >60)
Glucose, Bld: 154 mg/dL — ABNORMAL HIGH (ref 65–99)
Phosphorus: 5.1 mg/dL — ABNORMAL HIGH (ref 2.5–4.6)
Potassium: 5.3 mmol/L — ABNORMAL HIGH (ref 3.5–5.1)
SODIUM: 133 mmol/L — AB (ref 135–145)

## 2016-08-30 LAB — CBC
HCT: 24.5 % — ABNORMAL LOW (ref 36.0–46.0)
Hemoglobin: 7.8 g/dL — ABNORMAL LOW (ref 12.0–15.0)
MCH: 28.2 pg (ref 26.0–34.0)
MCHC: 31.8 g/dL (ref 30.0–36.0)
MCV: 88.4 fL (ref 78.0–100.0)
PLATELETS: 303 10*3/uL (ref 150–400)
RBC: 2.77 MIL/uL — AB (ref 3.87–5.11)
RDW: 16.8 % — AB (ref 11.5–15.5)
WBC: 7.3 10*3/uL (ref 4.0–10.5)

## 2016-08-30 LAB — MAGNESIUM: Magnesium: 2.1 mg/dL (ref 1.7–2.4)

## 2016-08-30 NOTE — Progress Notes (Signed)
Subjective:  Patient is calm today. Lurline Idol is capped. Able to follow very basic commands S Creatinie has increased today   Objective:  Vital signs in last 24 hours:  Temperature 98.2, pulse 99,  respirations 12, blood pressure 128/73  Physical Exam: General: Chronically ill appearing, NAD  HEENT Anicteric, moist oral mucus membranes  Neck Trach in place  Pulm/lungs normal effort, scattered rhonchi  CVS/Heart Regular, no rub  Abdomen:  PEG tube, soft NT ND,    Extremities: trace edema, feet in soft supports  Neurologic: She is following commands  Skin: No acute rashes          Basic Metabolic Panel:   Recent Labs Lab 08/25/16 0642 08/28/16 0626 08/30/16 0700  NA 134* 133* 133*  K 4.2 5.2* 5.3*  CL 96* 95* 97*  CO2 '29 30 30  '$ GLUCOSE 176* 174* 154*  BUN '11 11 18  '$ CREATININE 0.89 1.02* 1.34*  CALCIUM 8.8* 9.0 9.3  MG  --  1.3* 2.1  PHOS  --  4.2 5.1*     CBC:  Recent Labs Lab 08/28/16 0626 08/30/16 0700  WBC 7.4 7.3  NEUTROABS 4.4  --   HGB 7.6* 7.8*  HCT 23.5* 24.5*  MCV 87.4 88.4  PLT 298 303      Microbiology:  Recent Results (from the past 720 hour(s))  C difficile quick scan w PCR reflex     Status: Abnormal   Collection Time: 08/11/16  1:27 AM  Result Value Ref Range Status   C Diff antigen POSITIVE (A) NEGATIVE Final   C Diff toxin POSITIVE (A) NEGATIVE Final   C Diff interpretation Toxin producing C. difficile detected.  Final    Comment: CRITICAL RESULT CALLED TO, READ BACK BY AND VERIFIED WITH: ANDY BRAKE,RN '@0703'$  08/11/16 MKELLY,MLT   Surgical pcr screen     Status: None   Collection Time: 08/11/16  1:28 AM  Result Value Ref Range Status   MRSA, PCR NEGATIVE NEGATIVE Final   Staphylococcus aureus NEGATIVE NEGATIVE Final    Comment:        The Xpert SA Assay (FDA approved for NASAL specimens in patients over 75 years of age), is one component of a comprehensive surveillance program.  Test performance has been validated by  [facility] for patients greater than or equal to 14 year old. It is not intended to diagnose infection nor to guide or monitor treatment.   Culture, blood (routine x 2)     Status: None   Collection Time: 08/11/16  4:07 AM  Result Value Ref Range Status   Specimen Description BLOOD RIGHT ARM  Final   Special Requests BOTTLES DRAWN AEROBIC AND ANAEROBIC 4ML  Final   Culture NO GROWTH 5 DAYS  Final   Report Status 08/16/2016 FINAL  Final  Culture, blood (routine x 2)     Status: None   Collection Time: 08/11/16  4:14 AM  Result Value Ref Range Status   Specimen Description BLOOD RIGHT ARM  Final   Special Requests IN PEDIATRIC BOTTLE 3ML  Final   Culture NO GROWTH 5 DAYS  Final   Report Status 08/16/2016 FINAL  Final  Culture, blood (single)     Status: None   Collection Time: 08/11/16  8:40 AM  Result Value Ref Range Status   Specimen Description BLOOD PICC LINE  Final   Special Requests BOTTLES DRAWN AEROBIC AND ANAEROBIC 5CC  Final   Culture NO GROWTH 5 DAYS  Final   Report Status  08/16/2016 FINAL  Final  Culture, respiratory (NON-Expectorated)     Status: None   Collection Time: 08/11/16  9:10 AM  Result Value Ref Range Status   Specimen Description TRACHEAL ASPIRATE  Final   Special Requests NONE  Final   Gram Stain   Final    RARE WBC PRESENT,BOTH PMN AND MONONUCLEAR FEW SQUAMOUS EPITHELIAL CELLS PRESENT MODERATE GRAM POSITIVE RODS FEW GRAM POSITIVE COCCI IN PAIRS FEW YEAST    Culture FEW ESCHERICHIA COLI  Final   Report Status 08/14/2016 FINAL  Final   Organism ID, Bacteria ESCHERICHIA COLI  Final      Susceptibility   Escherichia coli - MIC*    AMPICILLIN >=32 RESISTANT Resistant     CEFAZOLIN <=4 SENSITIVE Sensitive     CEFEPIME <=1 SENSITIVE Sensitive     CEFTAZIDIME <=1 SENSITIVE Sensitive     CEFTRIAXONE <=1 SENSITIVE Sensitive     CIPROFLOXACIN 2 INTERMEDIATE Intermediate     GENTAMICIN <=1 SENSITIVE Sensitive     IMIPENEM <=0.25 SENSITIVE Sensitive      TRIMETH/SULFA >=320 RESISTANT Resistant     AMPICILLIN/SULBACTAM 16 INTERMEDIATE Intermediate     Extended ESBL NEGATIVE Sensitive     * FEW ESCHERICHIA COLI    Coagulation Studies: No results for input(s): LABPROT, INR in the last 72 hours.  Urinalysis: No results for input(s): COLORURINE, LABSPEC, PHURINE, GLUCOSEU, HGBUR, BILIRUBINUR, KETONESUR, PROTEINUR, UROBILINOGEN, NITRITE, LEUKOCYTESUR in the last 72 hours.  Invalid input(s): APPERANCEUR    Imaging: No results found.   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 Acute renal failure  Likely secondary to ATN from septic shock. CRRT initially started on September 30. Subsequently, Transitioned to intermittent hemodialysis. Last hemodialysis was 11/13 - No further need for HD is anticipated - dialysis cathter has been removed - recent increase in creatinine is likely secondary to over diuresis  2. Acute respiratory failure -  Lurline Idol is capped   3. Leg  Edema - likely 3rd spacing due to low albumin -  Hold lasix for next few days  4.  HTN May discontinue amlodipine if SBP < 120     LOS: 0 Collier Bohnet 12/6/20174:40 PM

## 2016-08-31 LAB — RENAL FUNCTION PANEL
Albumin: 2.1 g/dL — ABNORMAL LOW (ref 3.5–5.0)
Anion gap: 9 (ref 5–15)
BUN: 19 mg/dL (ref 6–20)
CHLORIDE: 98 mmol/L — AB (ref 101–111)
CO2: 28 mmol/L (ref 22–32)
Calcium: 9.2 mg/dL (ref 8.9–10.3)
Creatinine, Ser: 1.41 mg/dL — ABNORMAL HIGH (ref 0.44–1.00)
GFR, EST AFRICAN AMERICAN: 45 mL/min — AB (ref >60)
GFR, EST NON AFRICAN AMERICAN: 39 mL/min — AB (ref >60)
Glucose, Bld: 202 mg/dL — ABNORMAL HIGH (ref 65–99)
POTASSIUM: 4.5 mmol/L (ref 3.5–5.1)
Phosphorus: 5.3 mg/dL — ABNORMAL HIGH (ref 2.5–4.6)
Sodium: 135 mmol/L (ref 135–145)

## 2016-09-01 NOTE — Progress Notes (Signed)
Subjective:  Patient is calm today. Angelica Beck is capped. Able to follow very basic commands S Creatinie has slightly increased further  Objective:  Vital signs in last 24 hours:  Temperature 97.2, pulse 83,  respirations 16, blood pressure 132/66  Physical Exam: General: Chronically ill appearing, NAD  HEENT Anicteric, moist oral mucus membranes  Neck Trach in place  Pulm/lungs normal effort, scattered rhonchi  CVS/Heart Regular, no rub  Abdomen:  PEG tube, soft NT ND,    Extremities: trace edema, feet in soft supports  Neurologic: She is following simple commands  Skin: No acute rashes          Basic Metabolic Panel:   Recent Labs Lab 08/28/16 0626 08/30/16 0700 08/31/16 0628  NA 133* 133* 135  K 5.2* 5.3* 4.5  CL 95* 97* 98*  CO2 '30 30 28  '$ GLUCOSE 174* 154* 202*  BUN '11 18 19  '$ CREATININE 1.02* 1.34* 1.41*  CALCIUM 9.0 9.3 9.2  MG 1.3* 2.1  --   PHOS 4.2 5.1* 5.3*     CBC:  Recent Labs Lab 08/28/16 0626 08/30/16 0700  WBC 7.4 7.3  NEUTROABS 4.4  --   HGB 7.6* 7.8*  HCT 23.5* 24.5*  MCV 87.4 88.4  PLT 298 303      Microbiology:  Recent Results (from the past 720 hour(s))  C difficile quick scan w PCR reflex     Status: Abnormal   Collection Time: 08/11/16  1:27 AM  Result Value Ref Range Status   C Diff antigen POSITIVE (A) NEGATIVE Final   C Diff toxin POSITIVE (A) NEGATIVE Final   C Diff interpretation Toxin producing C. difficile detected.  Final    Comment: CRITICAL RESULT CALLED TO, READ BACK BY AND VERIFIED WITH: ANDY BRAKE,RN '@0703'$  08/11/16 MKELLY,MLT   Surgical pcr screen     Status: None   Collection Time: 08/11/16  1:28 AM  Result Value Ref Range Status   MRSA, PCR NEGATIVE NEGATIVE Final   Staphylococcus aureus NEGATIVE NEGATIVE Final    Comment:        The Xpert SA Assay (FDA approved for NASAL specimens in patients over 74 years of age), is one component of a comprehensive surveillance program.  Test performance has been  validated by [facility] for patients greater than or equal to 2 year old. It is not intended to diagnose infection nor to guide or monitor treatment.   Culture, blood (routine x 2)     Status: None   Collection Time: 08/11/16  4:07 AM  Result Value Ref Range Status   Specimen Description BLOOD RIGHT ARM  Final   Special Requests BOTTLES DRAWN AEROBIC AND ANAEROBIC 4ML  Final   Culture NO GROWTH 5 DAYS  Final   Report Status 08/16/2016 FINAL  Final  Culture, blood (routine x 2)     Status: None   Collection Time: 08/11/16  4:14 AM  Result Value Ref Range Status   Specimen Description BLOOD RIGHT ARM  Final   Special Requests IN PEDIATRIC BOTTLE 3ML  Final   Culture NO GROWTH 5 DAYS  Final   Report Status 08/16/2016 FINAL  Final  Culture, blood (single)     Status: None   Collection Time: 08/11/16  8:40 AM  Result Value Ref Range Status   Specimen Description BLOOD PICC LINE  Final   Special Requests BOTTLES DRAWN AEROBIC AND ANAEROBIC 5CC  Final   Culture NO GROWTH 5 DAYS  Final   Report Status 08/16/2016  FINAL  Final  Culture, respiratory (NON-Expectorated)     Status: None   Collection Time: 08/11/16  9:10 AM  Result Value Ref Range Status   Specimen Description TRACHEAL ASPIRATE  Final   Special Requests NONE  Final   Gram Stain   Final    RARE WBC PRESENT,BOTH PMN AND MONONUCLEAR FEW SQUAMOUS EPITHELIAL CELLS PRESENT MODERATE GRAM POSITIVE RODS FEW GRAM POSITIVE COCCI IN PAIRS FEW YEAST    Culture FEW ESCHERICHIA COLI  Final   Report Status 08/14/2016 FINAL  Final   Organism ID, Bacteria ESCHERICHIA COLI  Final      Susceptibility   Escherichia coli - MIC*    AMPICILLIN >=32 RESISTANT Resistant     CEFAZOLIN <=4 SENSITIVE Sensitive     CEFEPIME <=1 SENSITIVE Sensitive     CEFTAZIDIME <=1 SENSITIVE Sensitive     CEFTRIAXONE <=1 SENSITIVE Sensitive     CIPROFLOXACIN 2 INTERMEDIATE Intermediate     GENTAMICIN <=1 SENSITIVE Sensitive     IMIPENEM <=0.25 SENSITIVE  Sensitive     TRIMETH/SULFA >=320 RESISTANT Resistant     AMPICILLIN/SULBACTAM 16 INTERMEDIATE Intermediate     Extended ESBL NEGATIVE Sensitive     * FEW ESCHERICHIA COLI    Coagulation Studies: No results for input(s): LABPROT, INR in the last 72 hours.  Urinalysis: No results for input(s): COLORURINE, LABSPEC, PHURINE, GLUCOSEU, HGBUR, BILIRUBINUR, KETONESUR, PROTEINUR, UROBILINOGEN, NITRITE, LEUKOCYTESUR in the last 72 hours.  Invalid input(s): APPERANCEUR    Imaging: No results found.   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 Acute renal failure  Likely secondary to ATN from septic shock. CRRT initially started on September 30. Subsequently, Transitioned to intermittent hemodialysis. Last hemodialysis was 11/13 - No further need for HD is anticipated - dialysis cathter has been removed - recent increase in creatinine is likely secondary to over diuresis - Lasix has been discontinued  2. Acute respiratory failure -  Angelica Beck is capped   3. Leg  Edema - likely 3rd spacing due to low albumin -  Hold lasix for next few days  4.  HTN May discontinue amlodipine if SBP < 120     LOS: 0 Angelica Beck 12/8/20179:21 AM

## 2016-09-04 LAB — BASIC METABOLIC PANEL
Anion gap: 10 (ref 5–15)
BUN: 28 mg/dL — AB (ref 6–20)
CHLORIDE: 98 mmol/L — AB (ref 101–111)
CO2: 25 mmol/L (ref 22–32)
CREATININE: 1.51 mg/dL — AB (ref 0.44–1.00)
Calcium: 9.4 mg/dL (ref 8.9–10.3)
GFR calc Af Amer: 41 mL/min — ABNORMAL LOW (ref >60)
GFR calc non Af Amer: 36 mL/min — ABNORMAL LOW (ref >60)
Glucose, Bld: 153 mg/dL — ABNORMAL HIGH (ref 65–99)
POTASSIUM: 4.5 mmol/L (ref 3.5–5.1)
Sodium: 133 mmol/L — ABNORMAL LOW (ref 135–145)

## 2016-09-04 LAB — CBC
HCT: 25.2 % — ABNORMAL LOW (ref 36.0–46.0)
Hemoglobin: 8 g/dL — ABNORMAL LOW (ref 12.0–15.0)
MCH: 28.2 pg (ref 26.0–34.0)
MCHC: 31.7 g/dL (ref 30.0–36.0)
MCV: 88.7 fL (ref 78.0–100.0)
PLATELETS: 317 10*3/uL (ref 150–400)
RBC: 2.84 MIL/uL — AB (ref 3.87–5.11)
RDW: 16.8 % — AB (ref 11.5–15.5)
WBC: 7.4 10*3/uL (ref 4.0–10.5)

## 2016-09-04 NOTE — Progress Notes (Signed)
Subjective:  Patient resting comfortably in bed. Not consistently following commands. Most recent creatinine 1.5.  Objective:  Vital signs in last 24 hours:  Temperature 97.9 pulse 92 respirations 16 blood pressure 143/74  Physical Exam: General: Chronically ill appearing, NAD  HEENT Anicteric, moist oral mucus membranes  Neck Trach in place  Pulm/lungs normal effort, scattered rhonchi  CVS/Heart Regular, no rub  Abdomen:  PEG tube, soft NTND    Extremities: trace edema, feet in soft supports  Neurologic: She is following simple commands  Skin: No acute rashes          Basic Metabolic Panel:   Recent Labs Lab 08/30/16 0700 08/31/16 0628 09/04/16 0719  NA 133* 135 133*  K 5.3* 4.5 4.5  CL 97* 98* 98*  CO2 '30 28 25  '$ GLUCOSE 154* 202* 153*  BUN 18 19 28*  CREATININE 1.34* 1.41* 1.51*  CALCIUM 9.3 9.2 9.4  MG 2.1  --   --   PHOS 5.1* 5.3*  --      CBC:  Recent Labs Lab 08/30/16 0700 09/04/16 0719  WBC 7.3 7.4  HGB 7.8* 8.0*  HCT 24.5* 25.2*  MCV 88.4 88.7  PLT 303 317      Microbiology:  Recent Results (from the past 720 hour(s))  C difficile quick scan w PCR reflex     Status: Abnormal   Collection Time: 08/11/16  1:27 AM  Result Value Ref Range Status   C Diff antigen POSITIVE (A) NEGATIVE Final   C Diff toxin POSITIVE (A) NEGATIVE Final   C Diff interpretation Toxin producing C. difficile detected.  Final    Comment: CRITICAL RESULT CALLED TO, READ BACK BY AND VERIFIED WITH: ANDY BRAKE,RN '@0703'$  08/11/16 MKELLY,MLT   Surgical pcr screen     Status: None   Collection Time: 08/11/16  1:28 AM  Result Value Ref Range Status   MRSA, PCR NEGATIVE NEGATIVE Final   Staphylococcus aureus NEGATIVE NEGATIVE Final    Comment:        The Xpert SA Assay (FDA approved for NASAL specimens in patients over 72 years of age), is one component of a comprehensive surveillance program.  Test performance has been validated by [facility] for patients  greater than or equal to 67 year old. It is not intended to diagnose infection nor to guide or monitor treatment.   Culture, blood (routine x 2)     Status: None   Collection Time: 08/11/16  4:07 AM  Result Value Ref Range Status   Specimen Description BLOOD RIGHT ARM  Final   Special Requests BOTTLES DRAWN AEROBIC AND ANAEROBIC 4ML  Final   Culture NO GROWTH 5 DAYS  Final   Report Status 08/16/2016 FINAL  Final  Culture, blood (routine x 2)     Status: None   Collection Time: 08/11/16  4:14 AM  Result Value Ref Range Status   Specimen Description BLOOD RIGHT ARM  Final   Special Requests IN PEDIATRIC BOTTLE 3ML  Final   Culture NO GROWTH 5 DAYS  Final   Report Status 08/16/2016 FINAL  Final  Culture, blood (single)     Status: None   Collection Time: 08/11/16  8:40 AM  Result Value Ref Range Status   Specimen Description BLOOD PICC LINE  Final   Special Requests BOTTLES DRAWN AEROBIC AND ANAEROBIC 5CC  Final   Culture NO GROWTH 5 DAYS  Final   Report Status 08/16/2016 FINAL  Final  Culture, respiratory (NON-Expectorated)  Status: None   Collection Time: 08/11/16  9:10 AM  Result Value Ref Range Status   Specimen Description TRACHEAL ASPIRATE  Final   Special Requests NONE  Final   Gram Stain   Final    RARE WBC PRESENT,BOTH PMN AND MONONUCLEAR FEW SQUAMOUS EPITHELIAL CELLS PRESENT MODERATE GRAM POSITIVE RODS FEW GRAM POSITIVE COCCI IN PAIRS FEW YEAST    Culture FEW ESCHERICHIA COLI  Final   Report Status 08/14/2016 FINAL  Final   Organism ID, Bacteria ESCHERICHIA COLI  Final      Susceptibility   Escherichia coli - MIC*    AMPICILLIN >=32 RESISTANT Resistant     CEFAZOLIN <=4 SENSITIVE Sensitive     CEFEPIME <=1 SENSITIVE Sensitive     CEFTAZIDIME <=1 SENSITIVE Sensitive     CEFTRIAXONE <=1 SENSITIVE Sensitive     CIPROFLOXACIN 2 INTERMEDIATE Intermediate     GENTAMICIN <=1 SENSITIVE Sensitive     IMIPENEM <=0.25 SENSITIVE Sensitive     TRIMETH/SULFA >=320  RESISTANT Resistant     AMPICILLIN/SULBACTAM 16 INTERMEDIATE Intermediate     Extended ESBL NEGATIVE Sensitive     * FEW ESCHERICHIA COLI    Coagulation Studies: No results for input(s): LABPROT, INR in the last 72 hours.  Urinalysis: No results for input(s): COLORURINE, LABSPEC, PHURINE, GLUCOSEU, HGBUR, BILIRUBINUR, KETONESUR, PROTEINUR, UROBILINOGEN, NITRITE, LEUKOCYTESUR in the last 72 hours.  Invalid input(s): APPERANCEUR    Imaging: No results found.   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 Acute renal failure  Likely secondary to ATN from septic shock. CRRT initially started on September 30. Subsequently, Transitioned to intermittent hemodialysis. Last hemodialysis was 11/13 - overall renal function appears to be stable. No further need for dialysis and dissipated at this time. Attempt to keep the patient euvolemic as possible. Continue to monitor renal function periodically.  2. Acute respiratory failure -  Patient appears to be breathing comfortably. Tracheostomy is currently.  3. Leg  Edema - the patient will likely have some form of lower extremity edema given low albumin and dependent status. Continue to periodically monitor.  4.  HTN Blood pressure currently acceptable at 143/74.     LOS: 0 Angelica Beck 12/11/20174:05 PM

## 2016-09-06 DIAGNOSIS — J9500 Unspecified tracheostomy complication: Secondary | ICD-10-CM | POA: Diagnosis not present

## 2016-09-06 DIAGNOSIS — Z930 Tracheostomy status: Secondary | ICD-10-CM | POA: Diagnosis not present

## 2016-09-06 DIAGNOSIS — T82599A Other mechanical complication of unspecified cardiac and vascular devices and implants, initial encounter: Secondary | ICD-10-CM | POA: Diagnosis not present

## 2016-09-06 DIAGNOSIS — Z430 Encounter for attention to tracheostomy: Secondary | ICD-10-CM | POA: Diagnosis not present

## 2016-09-06 DIAGNOSIS — I5022 Chronic systolic (congestive) heart failure: Secondary | ICD-10-CM | POA: Diagnosis not present

## 2016-09-06 DIAGNOSIS — N179 Acute kidney failure, unspecified: Secondary | ICD-10-CM | POA: Diagnosis not present

## 2016-09-06 DIAGNOSIS — I519 Heart disease, unspecified: Secondary | ICD-10-CM | POA: Diagnosis not present

## 2016-09-06 DIAGNOSIS — Z431 Encounter for attention to gastrostomy: Secondary | ICD-10-CM | POA: Diagnosis not present

## 2016-09-06 DIAGNOSIS — R488 Other symbolic dysfunctions: Secondary | ICD-10-CM | POA: Diagnosis not present

## 2016-09-06 DIAGNOSIS — R1312 Dysphagia, oropharyngeal phase: Secondary | ICD-10-CM | POA: Diagnosis not present

## 2016-09-06 DIAGNOSIS — J9600 Acute respiratory failure, unspecified whether with hypoxia or hypercapnia: Secondary | ICD-10-CM | POA: Diagnosis not present

## 2016-09-06 DIAGNOSIS — I2510 Atherosclerotic heart disease of native coronary artery without angina pectoris: Secondary | ICD-10-CM | POA: Diagnosis not present

## 2016-09-06 DIAGNOSIS — Z8673 Personal history of transient ischemic attack (TIA), and cerebral infarction without residual deficits: Secondary | ICD-10-CM | POA: Diagnosis not present

## 2016-09-06 DIAGNOSIS — J9610 Chronic respiratory failure, unspecified whether with hypoxia or hypercapnia: Secondary | ICD-10-CM | POA: Diagnosis not present

## 2016-09-06 DIAGNOSIS — R4189 Other symptoms and signs involving cognitive functions and awareness: Secondary | ICD-10-CM | POA: Diagnosis not present

## 2016-09-06 DIAGNOSIS — Z79899 Other long term (current) drug therapy: Secondary | ICD-10-CM | POA: Diagnosis not present

## 2016-09-06 DIAGNOSIS — K56609 Unspecified intestinal obstruction, unspecified as to partial versus complete obstruction: Secondary | ICD-10-CM | POA: Diagnosis not present

## 2016-09-06 DIAGNOSIS — I110 Hypertensive heart disease with heart failure: Secondary | ICD-10-CM | POA: Diagnosis not present

## 2016-09-06 DIAGNOSIS — E662 Morbid (severe) obesity with alveolar hypoventilation: Secondary | ICD-10-CM | POA: Diagnosis not present

## 2016-09-06 DIAGNOSIS — E1149 Type 2 diabetes mellitus with other diabetic neurological complication: Secondary | ICD-10-CM | POA: Diagnosis not present

## 2016-09-06 DIAGNOSIS — I10 Essential (primary) hypertension: Secondary | ICD-10-CM | POA: Diagnosis not present

## 2016-09-06 DIAGNOSIS — R069 Unspecified abnormalities of breathing: Secondary | ICD-10-CM | POA: Diagnosis not present

## 2016-09-06 DIAGNOSIS — K5900 Constipation, unspecified: Secondary | ICD-10-CM | POA: Diagnosis not present

## 2016-09-06 DIAGNOSIS — I5040 Unspecified combined systolic (congestive) and diastolic (congestive) heart failure: Secondary | ICD-10-CM | POA: Diagnosis not present

## 2016-09-06 DIAGNOSIS — R1111 Vomiting without nausea: Secondary | ICD-10-CM | POA: Diagnosis not present

## 2016-09-06 DIAGNOSIS — Z7982 Long term (current) use of aspirin: Secondary | ICD-10-CM | POA: Diagnosis not present

## 2016-09-06 DIAGNOSIS — R1310 Dysphagia, unspecified: Secondary | ICD-10-CM | POA: Diagnosis not present

## 2016-09-06 DIAGNOSIS — R4182 Altered mental status, unspecified: Secondary | ICD-10-CM | POA: Diagnosis not present

## 2016-09-06 DIAGNOSIS — R0602 Shortness of breath: Secondary | ICD-10-CM | POA: Diagnosis not present

## 2016-09-06 DIAGNOSIS — R402411 Glasgow coma scale score 13-15, in the field [EMT or ambulance]: Secondary | ICD-10-CM | POA: Diagnosis not present

## 2016-09-06 DIAGNOSIS — D649 Anemia, unspecified: Secondary | ICD-10-CM | POA: Diagnosis not present

## 2016-09-06 DIAGNOSIS — R918 Other nonspecific abnormal finding of lung field: Secondary | ICD-10-CM | POA: Diagnosis not present

## 2016-09-06 DIAGNOSIS — K56699 Other intestinal obstruction unspecified as to partial versus complete obstruction: Secondary | ICD-10-CM | POA: Diagnosis not present

## 2016-09-06 DIAGNOSIS — I639 Cerebral infarction, unspecified: Secondary | ICD-10-CM | POA: Diagnosis not present

## 2016-09-06 DIAGNOSIS — J45909 Unspecified asthma, uncomplicated: Secondary | ICD-10-CM | POA: Diagnosis not present

## 2016-09-06 DIAGNOSIS — J9620 Acute and chronic respiratory failure, unspecified whether with hypoxia or hypercapnia: Secondary | ICD-10-CM | POA: Diagnosis not present

## 2016-09-06 DIAGNOSIS — T8131XA Disruption of external operation (surgical) wound, not elsewhere classified, initial encounter: Secondary | ICD-10-CM | POA: Diagnosis not present

## 2016-09-06 DIAGNOSIS — E119 Type 2 diabetes mellitus without complications: Secondary | ICD-10-CM | POA: Diagnosis not present

## 2016-09-06 DIAGNOSIS — R1110 Vomiting, unspecified: Secondary | ICD-10-CM | POA: Diagnosis not present

## 2016-09-06 DIAGNOSIS — T8189XA Other complications of procedures, not elsewhere classified, initial encounter: Secondary | ICD-10-CM | POA: Diagnosis not present

## 2016-09-06 DIAGNOSIS — N3020 Other chronic cystitis without hematuria: Secondary | ICD-10-CM | POA: Diagnosis not present

## 2016-09-06 DIAGNOSIS — J189 Pneumonia, unspecified organism: Secondary | ICD-10-CM | POA: Diagnosis not present

## 2016-09-06 DIAGNOSIS — R000 Tachycardia, unspecified: Secondary | ICD-10-CM | POA: Diagnosis not present

## 2016-09-06 DIAGNOSIS — R042 Hemoptysis: Secondary | ICD-10-CM | POA: Diagnosis not present

## 2016-09-06 DIAGNOSIS — J95850 Mechanical complication of respirator: Secondary | ICD-10-CM | POA: Diagnosis not present

## 2016-09-06 DIAGNOSIS — E0590 Thyrotoxicosis, unspecified without thyrotoxic crisis or storm: Secondary | ICD-10-CM | POA: Diagnosis not present

## 2016-09-06 DIAGNOSIS — R531 Weakness: Secondary | ICD-10-CM | POA: Diagnosis not present

## 2016-09-06 DIAGNOSIS — Z87891 Personal history of nicotine dependence: Secondary | ICD-10-CM | POA: Diagnosis not present

## 2016-09-06 DIAGNOSIS — E039 Hypothyroidism, unspecified: Secondary | ICD-10-CM | POA: Diagnosis not present

## 2016-09-06 DIAGNOSIS — R739 Hyperglycemia, unspecified: Secondary | ICD-10-CM | POA: Diagnosis not present

## 2016-09-06 DIAGNOSIS — A0472 Enterocolitis due to Clostridium difficile, not specified as recurrent: Secondary | ICD-10-CM | POA: Diagnosis not present

## 2016-09-06 DIAGNOSIS — M6281 Muscle weakness (generalized): Secondary | ICD-10-CM | POA: Diagnosis not present

## 2016-09-06 DIAGNOSIS — M170 Bilateral primary osteoarthritis of knee: Secondary | ICD-10-CM | POA: Diagnosis not present

## 2016-09-06 DIAGNOSIS — R498 Other voice and resonance disorders: Secondary | ICD-10-CM | POA: Diagnosis not present

## 2016-09-06 DIAGNOSIS — Z794 Long term (current) use of insulin: Secondary | ICD-10-CM | POA: Diagnosis not present

## 2016-09-06 DIAGNOSIS — R600 Localized edema: Secondary | ICD-10-CM | POA: Diagnosis not present

## 2016-09-06 DIAGNOSIS — J9509 Other tracheostomy complication: Secondary | ICD-10-CM | POA: Diagnosis present

## 2016-09-06 DIAGNOSIS — J9503 Malfunction of tracheostomy stoma: Secondary | ICD-10-CM | POA: Diagnosis not present

## 2016-09-06 NOTE — Progress Notes (Signed)
Subjective:  No new renal function testing today. Renal function was last checked on 09/04/2016 at which point her creatinine was 1.5. Patient is resting comfortably at the moment.  Objective:  Vital signs in last 24 hours:  Temperature 97.5 pulse 89 respirations 19 blood pressure 157/109  Physical Exam: General: Chronically ill appearing, NAD  HEENT Anicteric, moist oral mucus membranes  Neck Trach in place  Pulm/lungs normal effort, scattered rhonchi  CVS/Heart Regular, no rub  Abdomen:  PEG tube, soft NTND    Extremities: trace edema, feet in soft supports  Neurologic: She is following simple commands, awake  Skin: No acute rashes          Basic Metabolic Panel:   Recent Labs Lab 08/31/16 0628 09/04/16 0719  NA 135 133*  K 4.5 4.5  CL 98* 98*  CO2 28 25  GLUCOSE 202* 153*  BUN 19 28*  CREATININE 1.41* 1.51*  CALCIUM 9.2 9.4  PHOS 5.3*  --      CBC:  Recent Labs Lab 09/04/16 0719  WBC 7.4  HGB 8.0*  HCT 25.2*  MCV 88.7  PLT 317      Microbiology:  Recent Results (from the past 720 hour(s))  C difficile quick scan w PCR reflex     Status: Abnormal   Collection Time: 08/11/16  1:27 AM  Result Value Ref Range Status   C Diff antigen POSITIVE (A) NEGATIVE Final   C Diff toxin POSITIVE (A) NEGATIVE Final   C Diff interpretation Toxin producing C. difficile detected.  Final    Comment: CRITICAL RESULT CALLED TO, READ BACK BY AND VERIFIED WITH: ANDY BRAKE,RN '@0703'$  08/11/16 MKELLY,MLT   Surgical pcr screen     Status: None   Collection Time: 08/11/16  1:28 AM  Result Value Ref Range Status   MRSA, PCR NEGATIVE NEGATIVE Final   Staphylococcus aureus NEGATIVE NEGATIVE Final    Comment:        The Xpert SA Assay (FDA approved for NASAL specimens in patients over 31 years of age), is one component of a comprehensive surveillance program.  Test performance has been validated by [facility] for patients greater than or equal to 49 year old. It  is not intended to diagnose infection nor to guide or monitor treatment.   Culture, blood (routine x 2)     Status: None   Collection Time: 08/11/16  4:07 AM  Result Value Ref Range Status   Specimen Description BLOOD RIGHT ARM  Final   Special Requests BOTTLES DRAWN AEROBIC AND ANAEROBIC 4ML  Final   Culture NO GROWTH 5 DAYS  Final   Report Status 08/16/2016 FINAL  Final  Culture, blood (routine x 2)     Status: None   Collection Time: 08/11/16  4:14 AM  Result Value Ref Range Status   Specimen Description BLOOD RIGHT ARM  Final   Special Requests IN PEDIATRIC BOTTLE 3ML  Final   Culture NO GROWTH 5 DAYS  Final   Report Status 08/16/2016 FINAL  Final  Culture, blood (single)     Status: None   Collection Time: 08/11/16  8:40 AM  Result Value Ref Range Status   Specimen Description BLOOD PICC LINE  Final   Special Requests BOTTLES DRAWN AEROBIC AND ANAEROBIC 5CC  Final   Culture NO GROWTH 5 DAYS  Final   Report Status 08/16/2016 FINAL  Final  Culture, respiratory (NON-Expectorated)     Status: None   Collection Time: 08/11/16  9:10 AM  Result Value Ref Range Status   Specimen Description TRACHEAL ASPIRATE  Final   Special Requests NONE  Final   Gram Stain   Final    RARE WBC PRESENT,BOTH PMN AND MONONUCLEAR FEW SQUAMOUS EPITHELIAL CELLS PRESENT MODERATE GRAM POSITIVE RODS FEW GRAM POSITIVE COCCI IN PAIRS FEW YEAST    Culture FEW ESCHERICHIA COLI  Final   Report Status 08/14/2016 FINAL  Final   Organism ID, Bacteria ESCHERICHIA COLI  Final      Susceptibility   Escherichia coli - MIC*    AMPICILLIN >=32 RESISTANT Resistant     CEFAZOLIN <=4 SENSITIVE Sensitive     CEFEPIME <=1 SENSITIVE Sensitive     CEFTAZIDIME <=1 SENSITIVE Sensitive     CEFTRIAXONE <=1 SENSITIVE Sensitive     CIPROFLOXACIN 2 INTERMEDIATE Intermediate     GENTAMICIN <=1 SENSITIVE Sensitive     IMIPENEM <=0.25 SENSITIVE Sensitive     TRIMETH/SULFA >=320 RESISTANT Resistant     AMPICILLIN/SULBACTAM  16 INTERMEDIATE Intermediate     Extended ESBL NEGATIVE Sensitive     * FEW ESCHERICHIA COLI    Coagulation Studies: No results for input(s): LABPROT, INR in the last 72 hours.  Urinalysis: No results for input(s): COLORURINE, LABSPEC, PHURINE, GLUCOSEU, HGBUR, BILIRUBINUR, KETONESUR, PROTEINUR, UROBILINOGEN, NITRITE, LEUKOCYTESUR in the last 72 hours.  Invalid input(s): APPERANCEUR    Imaging: No results found.   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 Acute renal failure  Likely secondary to ATN from septic shock. CRRT initially started on September 30. Subsequently, Transitioned to intermittent hemodialysis. Last hemodialysis was 11/13 - we will repeat renal function testing tomorrow. As before avoid nephrotoxins and attempt to keep the patient euvolemic.  2. Acute respiratory failure -  Tracheostomy is intact and the patient is breathing comfortably. Continue to monitor respiratory status.  3. Leg  Edema - continues to be stable at this point in time.  Avoid high-dose diuretics as possible.  4.  HTN Blood pressure continues to fluctuate periodically. Currently 157/109. Continue amlodipine and metoprolol.     LOS: 0 Montgomery Rothlisberger 12/13/20173:52 PM

## 2016-09-07 ENCOUNTER — Non-Acute Institutional Stay (SKILLED_NURSING_FACILITY): Payer: Medicare Other | Admitting: Internal Medicine

## 2016-09-07 ENCOUNTER — Encounter: Payer: Self-pay | Admitting: Internal Medicine

## 2016-09-07 DIAGNOSIS — J9620 Acute and chronic respiratory failure, unspecified whether with hypoxia or hypercapnia: Secondary | ICD-10-CM | POA: Diagnosis not present

## 2016-09-07 DIAGNOSIS — R739 Hyperglycemia, unspecified: Secondary | ICD-10-CM | POA: Diagnosis not present

## 2016-09-07 DIAGNOSIS — D649 Anemia, unspecified: Secondary | ICD-10-CM | POA: Diagnosis not present

## 2016-09-07 DIAGNOSIS — K56609 Unspecified intestinal obstruction, unspecified as to partial versus complete obstruction: Secondary | ICD-10-CM | POA: Diagnosis not present

## 2016-09-07 DIAGNOSIS — A0472 Enterocolitis due to Clostridium difficile, not specified as recurrent: Secondary | ICD-10-CM | POA: Diagnosis not present

## 2016-09-07 DIAGNOSIS — N179 Acute kidney failure, unspecified: Secondary | ICD-10-CM

## 2016-09-07 NOTE — Assessment & Plan Note (Addendum)
09/07/16 clinically no evidence of ileus at this time. Speech therapy will continue to monitor for dysphagia. Nocturnal tube feedings will be continued. Erythromycin is being used off label for ileus, GI be consulted as to continuing this

## 2016-09-07 NOTE — Assessment & Plan Note (Signed)
09/04/16 hemoglobin 8/hematocrit 25.2 Continue iron and monitor hematocrit

## 2016-09-07 NOTE — Assessment & Plan Note (Signed)
Glucoses range 153-202, hyperglycemia most likely related to the tube feedings Basal insulin at bedtime will be initiated

## 2016-09-07 NOTE — Assessment & Plan Note (Addendum)
09/07/16 resolved after vancomycin (questionable duration of therapy)  prior to admission 11/16 and treatment with Flagyl then vancomycin at [facility] and vanc alone at [place] weaning protocol

## 2016-09-07 NOTE — Assessment & Plan Note (Signed)
09/07/16 bronchorrhea is a major issue. Nebulized treatments will be ordered along with suctioning as needed.

## 2016-09-07 NOTE — Patient Instructions (Signed)
See Current Assessment & Plan in Problem List under specific Diagnosis 

## 2016-09-07 NOTE — Progress Notes (Signed)
Facility Location: [facility]  Room Number:  Code Status:   PCP: Delia Chimes, [address] [address]  This is a comprehensive admission note to [facility] performed on this date less than 30 days from date of admission. Included are preadmission medical/surgical history;reconciled medication list; family history; social history and comprehensive review of systems.  Corrections and additions to the records were documented . Comprehensive physical exam was also performed. Additionally a clinical summary was entered for each active diagnosis pertinent to this admission in the Problem List to enhance continuity of care.  HPI: The patient was admitted to [facility] 11/16 and discharged 08/18/16 to the [facility] where she stayed until 09/05/16. She was originally admitted with small bowel obstruction/ileus. She was treated conservatively with G-tube to gravity. There was gradual improvement with reinitiation of tube feedings on 11/21. Course was complicated by healthcare associated pneumonia in the L lower lobe with culture + for Escherichia coli. Patient was C. difficile positive for she received IV Flagyl then oral vancomycin. She did actually test positive for C. difficile originally 10/10 & was discharged on oral vancomycin at that time. It was unclear the extent of the course of that antibiotic. She is presently on a vancomycin weaning protocol at the SNF She had acute kidney injury with a creatinine of 2.15. The patient had received intermittent dialysis prior to the 11/16 admission. She did not receive hemodialysis while an inpatient. As noted she was transferred 10/24 to [place] bowel obstruction resolved but the patient had persistent ileus; erythromycin and Reglan were added. Speech therapy performed a swallowing study, the patient was placed on a dysphagia 1 diet with nectar  thick liquids and she was received to feeding nocturnally with Nepro 45 mL per hour with 30 mL free water flush every 3 hours to be continued through the night. The patient has had acute on chronic respiratory failure for which she had a tracheostomy 07/14/16 with thyroid isthmusectomy. The patient has not tolerated capping the trach. Vancomycin for C dif  was tapered. Her acute kidney injury was felt to be stable. Last hemodialysis was 08/07/16.  Her diabetes was treated with sliding scale Humalog every 6 hours. She was continued on methimazole for hyperthyroidism monitor of TSH and free T4.  She has generalized weakness and physical therapy and occupational therapy were to be continued in the SNF.  Past medical and surgical history: Includes obesity hypoventilation syndrome, history of hyperthyroidism, hypertension, dyslipidemia, diabetes, and arthritis. She states that she has had asthma She has had a ventral hernia repair.  Social history: Nondrinker, former smoker.  Family history: Mother has diabetes. No family history of heart attack, stroke, cancer according to the patient.  Review of systems: Completion was limited by the patient's nonverbal state as she has a tracheostomy. Responses were in the form of nonverbal clues such as nodding her head. She actually denies any active cardiopulmonary, GI, GU, or neurologic symptoms. Cardiovascular: No chest pain, palpitations,paroxysmal nocturnal dyspnea, claudication, edema  Respiratory: No cough, sputum production,hemoptysis  Gastrointestinal: No heartburn,dysphagia,abdominal pain, nausea / vomiting,rectal bleeding, melena,change in bowels Genitourinary: No dysuria,hematuria, pyuria,  incontinence, nocturia Musculoskeletal: No joint stiffness, joint swelling, weakness,pain Dermatologic: No rash, pruritus, change in appearance of skin Neurologic: No numbness , tingling  Physical exam:  Pertinent or positive findings: She has intermittent  ptosis of the right eye. She is completely edentulous. Tracheostomy is present with a trach collar. Initially  she had coarse, loud rhonchi in all lung fields. Heart sounds were obscured. After suctioning the rhonchi improved dramatically. Suctioned secretions were clear. PEG present. Pedal pulses, especially posterior tibial pulses are decreased or nonpalpable. She has gross wasting of the gastrocnemius muscles. Flexion of the toes is present. Fusiform changes in the knees are noted. She has exfoliative ranges of the skin over the feet. She has minimal movement of the lower extremities especially on the left. She has weakness in the upper extremities. She has colorful, artificial nails. O2 sats were 100% on room air despite the rhonchi.   General appearance:Adequately nourished; no acute distress , increased work of breathing is present despite rhonchi.   Lymphatic: No lymphadenopathy about the head, neck, axilla . Eyes: No conjunctival inflammation or lid edema is present. There is no scleral icterus. Ears:  External ear exam shows no significant lesions or deformities.   Nose:  External nasal examination shows no deformity or inflammation. Nasal mucosa are pink and moist without lesions ,exudates Oral exam: lips and gums are healthy appearing..Oropharynx poorly visualized Neck:  No  masses, tenderness noted.    Abdomen:Bowel sounds are normal. Abdomen is soft and nontender with no organomegaly, hernias,masses. GU: deferred  Extremities:  No cyanosis, clubbing,edema  Neurologic exam : Strength equal  in upper  extremities Balance,Rhomberg,finger to nose testing could not be completed due to clinical state Skin: Warm & dry w/o tenting. No significant lesions or rash.  See clinical summary under each active problem in the Problem List with associated updated therapeutic plan

## 2016-09-07 NOTE — Assessment & Plan Note (Signed)
09/04/16 creatinine 1.51

## 2016-09-10 ENCOUNTER — Encounter (HOSPITAL_COMMUNITY): Payer: Self-pay | Admitting: Emergency Medicine

## 2016-09-10 ENCOUNTER — Emergency Department (HOSPITAL_COMMUNITY): Payer: Medicare Other

## 2016-09-10 ENCOUNTER — Emergency Department (HOSPITAL_COMMUNITY)
Admission: EM | Admit: 2016-09-10 | Discharge: 2016-09-10 | Disposition: A | Discharge status: Home / Self Care | Payer: Medicare Other | Attending: Emergency Medicine | Admitting: Emergency Medicine

## 2016-09-10 DIAGNOSIS — I110 Hypertensive heart disease with heart failure: Secondary | ICD-10-CM | POA: Insufficient documentation

## 2016-09-10 DIAGNOSIS — Z430 Encounter for attention to tracheostomy: Secondary | ICD-10-CM | POA: Diagnosis not present

## 2016-09-10 DIAGNOSIS — E119 Type 2 diabetes mellitus without complications: Secondary | ICD-10-CM | POA: Insufficient documentation

## 2016-09-10 DIAGNOSIS — R918 Other nonspecific abnormal finding of lung field: Secondary | ICD-10-CM | POA: Diagnosis not present

## 2016-09-10 DIAGNOSIS — E662 Morbid (severe) obesity with alveolar hypoventilation: Secondary | ICD-10-CM | POA: Diagnosis not present

## 2016-09-10 DIAGNOSIS — Z79899 Other long term (current) drug therapy: Secondary | ICD-10-CM | POA: Insufficient documentation

## 2016-09-10 DIAGNOSIS — I2510 Atherosclerotic heart disease of native coronary artery without angina pectoris: Secondary | ICD-10-CM | POA: Insufficient documentation

## 2016-09-10 DIAGNOSIS — Z794 Long term (current) use of insulin: Secondary | ICD-10-CM | POA: Insufficient documentation

## 2016-09-10 DIAGNOSIS — J9610 Chronic respiratory failure, unspecified whether with hypoxia or hypercapnia: Secondary | ICD-10-CM | POA: Diagnosis not present

## 2016-09-10 DIAGNOSIS — J9503 Malfunction of tracheostomy stoma: Secondary | ICD-10-CM | POA: Diagnosis not present

## 2016-09-10 DIAGNOSIS — J45909 Unspecified asthma, uncomplicated: Secondary | ICD-10-CM | POA: Insufficient documentation

## 2016-09-10 DIAGNOSIS — I5022 Chronic systolic (congestive) heart failure: Secondary | ICD-10-CM | POA: Insufficient documentation

## 2016-09-10 DIAGNOSIS — Z7982 Long term (current) use of aspirin: Secondary | ICD-10-CM | POA: Insufficient documentation

## 2016-09-10 DIAGNOSIS — Z930 Tracheostomy status: Secondary | ICD-10-CM | POA: Diagnosis not present

## 2016-09-10 DIAGNOSIS — Z8673 Personal history of transient ischemic attack (TIA), and cerebral infarction without residual deficits: Secondary | ICD-10-CM | POA: Insufficient documentation

## 2016-09-10 LAB — CBC WITH DIFFERENTIAL/PLATELET
BASOS PCT: 0 %
Basophils Absolute: 0.1 10*3/uL (ref 0.0–0.1)
EOS ABS: 0.2 10*3/uL (ref 0.0–0.7)
EOS PCT: 2 %
HCT: 29.9 % — ABNORMAL LOW (ref 36.0–46.0)
Hemoglobin: 9.7 g/dL — ABNORMAL LOW (ref 12.0–15.0)
LYMPHS ABS: 3 10*3/uL (ref 0.7–4.0)
Lymphocytes Relative: 25 %
MCH: 28.5 pg (ref 26.0–34.0)
MCHC: 32.4 g/dL (ref 30.0–36.0)
MCV: 87.9 fL (ref 78.0–100.0)
MONO ABS: 0.8 10*3/uL (ref 0.1–1.0)
MONOS PCT: 7 %
Neutro Abs: 8 10*3/uL — ABNORMAL HIGH (ref 1.7–7.7)
Neutrophils Relative %: 66 %
PLATELETS: 327 10*3/uL (ref 150–400)
RBC: 3.4 MIL/uL — ABNORMAL LOW (ref 3.87–5.11)
RDW: 16.1 % — AB (ref 11.5–15.5)
WBC: 12 10*3/uL — ABNORMAL HIGH (ref 4.0–10.5)

## 2016-09-10 LAB — BASIC METABOLIC PANEL
Anion gap: 11 (ref 5–15)
BUN: 21 mg/dL — AB (ref 6–20)
CALCIUM: 10.4 mg/dL — AB (ref 8.9–10.3)
CHLORIDE: 102 mmol/L (ref 101–111)
CO2: 21 mmol/L — ABNORMAL LOW (ref 22–32)
CREATININE: 1.41 mg/dL — AB (ref 0.44–1.00)
GFR, EST AFRICAN AMERICAN: 45 mL/min — AB (ref >60)
GFR, EST NON AFRICAN AMERICAN: 39 mL/min — AB (ref >60)
Glucose, Bld: 166 mg/dL — ABNORMAL HIGH (ref 65–99)
Potassium: 4.6 mmol/L (ref 3.5–5.1)
SODIUM: 134 mmol/L — AB (ref 135–145)

## 2016-09-10 MED ORDER — SODIUM CHLORIDE 0.9 % IV BOLUS (SEPSIS)
1000.0000 mL | Freq: Once | INTRAVENOUS | Status: AC
  Administered 2016-09-10: 1000 mL via INTRAVENOUS

## 2016-09-10 MED ORDER — ONDANSETRON HCL 4 MG/2ML IJ SOLN
4.0000 mg | Freq: Once | INTRAMUSCULAR | Status: AC
  Administered 2016-09-10: 4 mg via INTRAVENOUS

## 2016-09-10 MED ORDER — SODIUM CHLORIDE 0.9 % IV BOLUS (SEPSIS)
500.0000 mL | Freq: Once | INTRAVENOUS | Status: AC
  Administered 2016-09-10: 500 mL via INTRAVENOUS

## 2016-09-10 NOTE — ED Notes (Signed)
PTAR present to transport patient back to facility. Pt is at baseline per daughter as far as mental status. NAD. Report given to PTAR.

## 2016-09-10 NOTE — ED Notes (Signed)
Nurse will get blood.

## 2016-09-10 NOTE — Discharge Instructions (Signed)
Continue your medications as prescribed. Follow up with your doctor. Return to the ED if you develop new or worsening symptoms.

## 2016-09-10 NOTE — Consult Note (Signed)
Reason for Consult:trach tube displacement Referring Physician: ER  Homer Pfeifer is an 65 y.o. female.  HPI: 65 year old female in whom I placed a tracheostomy in October for chronic respiratory failure.  She has been at a nursing facility recently.  Her indwelling #4 cuffless Shiley trach came dislodged at some point this morning and could not be replaced at the facility.  She was brought to the ER where attempts to replace the tube were not successful.  Consultation was requested.  Past Medical History:  Diagnosis Date  . Arthritis   . Asthma    09/07/16  . Diabetes mellitus, type 2 ([place])   . Hyperlipidemia   . Hypertension   . Hyperthyroidism   . Obesity hypoventilation syndrome ([place]) 06/20/2013  . Stroke [facility])     Past Surgical History:  Procedure Laterality Date  . IR GENERIC HISTORICAL  07/12/2016   IR US GUIDE VASC ACCESS RIGHT 07/12/2016 [name], MD [facility]  . IR GENERIC HISTORICAL  07/12/2016   IR FLUORO GUIDE CV LINE RIGHT 07/12/2016 [name], MD [facility]  . IR GENERIC HISTORICAL  07/17/2016   IR GASTROSTOMY TUBE MOD SED 07/17/2016 [name], MD [facility]  . IR GENERIC HISTORICAL  08/23/2016   IR REMOVAL TUN CV CATH W/O FL 08/23/2016 [name], MD [facility]  . TRACHEOSTOMY TUBE PLACEMENT N/A 07/14/2016   Procedure: TRACHEOSTOMY, THYROID ISTHMUSECTOMY;  Surgeon: [name], MD;  Location: [facility] OR;  Service: ENT;  Laterality: N/A;  . VENTRAL HERNIA REPAIR      Family History  Problem Relation Age of Onset  . Diabetes Mother   . Cancer Neg Hx   . Heart disease Neg Hx   . Stroke Neg Hx     Social History:  reports that she quit smoking about 13 years ago. Her smoking use included Cigarettes. She has a 7.50 pack-year smoking history. She has never used smokeless tobacco. She reports that she does not drink alcohol or use drugs.  Allergies:  Allergies  Allergen Reactions  . Penicillins Swelling    FACIAL SWELLING Has  patient had a PCN reaction causing immediate rash, facial/tongue/throat swelling, SOB or lightheadedness with hypotension: Yes Has patient had a PCN reaction causing severe rash involving mucus membranes or skin necrosis: No Has patient had a PCN reaction that required hospitalization: No Has patient had a PCN reaction occurring within the last 10 years: No If all of the above answers are "NO", then may proceed with Cephalosporin use.   . Lactose Intolerance (Gi) Diarrhea and Nausea And Vomiting    Medications: I have reviewed the patient's current medications.  No results found for this or any previous visit (from the past 48 hour(s)).  No results found.  Review of Systems  Unable to perform ROS: Mental acuity   Blood pressure 129/94, pulse 107, temperature 98.3 F (36.8 C), temperature source Oral, resp. rate 18, SpO2 99 %. Physical Exam  Constitutional: She appears well-developed and well-nourished.  HENT:  Head: Normocephalic and atraumatic.  Right Ear: External ear normal.  Left Ear: External ear normal.  Nose: Nose normal.  Mouth/Throat: Oropharynx is clear and moist.  Eyes: Conjunctivae and EOM are normal. Pupils are equal, round, and reactive to light.  Neck: Normal range of motion. Neck supple.  Small tracheostoma, unable to pass #4 Shiley tube.  Cardiovascular: Normal rate.   Respiratory:  Tachypneic.  Musculoskeletal: Normal range of motion.  Neurological: No cranial nerve deficit.  Does not phonate during  exam.  Skin: Skin is warm and dry.  Psychiatric:  Does not phonate during exam.    Assessment/Plan: Tracheostomy status, chronic respiratory failure At the bedside, urethral dilators were used to dilate the stoma enough to allow replacement of a #4 cuffless Shiley trach tube with only minor bleeding.  The patient had excellent air movement through the tube and the airway was suctioned through the tube.  She was returned to ER care in stable condition.   Discharge back to her facility will be according to ER care.  Nathalia Wismer 09/10/2016, 3:53 AM

## 2016-09-10 NOTE — ED Triage Notes (Signed)
Per EMS, pt from [place] facility. Pt woke up and trach was out. A&O x2 which is her baseline. EMS VS 160/94 BP, 120 Hr, 161 CBG, 100% RA and 18 Rr. Pt reports no respiratory distress.

## 2016-09-10 NOTE — Progress Notes (Signed)
Patient arrived via EMS from [place], attempted to reinsert patient's #4 cuffless distal trach, twice without success. Second RT attempted to insert new #4 cuffless trach without success-was able to insert catheter into trachea to push trach in, but trach would not pass. MD notified and at bedside during last attempt. He is notifying ENT. Awaiting orders at this time.

## 2016-09-10 NOTE — ED Provider Notes (Signed)
[facility] DEPT Provider Note   CSN: [id number] Arrival date & time: 09/10/16  0243   By signing my name below, I, Angelica Beck, attest that this documentation has been prepared under the direction and in the presence of Angelica Essex, MD. Electronically signed, Angelica Beck, ED Scribe. 09/10/16. 3:43 AM.   History   LEVEL 5 CAVEAT DUE TO PT NON-VERBAL  The history is provided by the patient and the EMS personnel. The history is limited by the condition of the patient. No language interpreter was used.    HPI Comments: Angelica Beck is a 65 y.o. female brought in by EMS from [facility] facility who presents to the Emergency Department after a removed tracheostomy. Per EMS, "Pt woke up and trach was out. A&O x2 which is her baseline. EMS VS 160/94 BP, 120 Hr, 161 CBG, 100% RA and 18 Rr. Pt reports no respiratory distress."    Past Medical History:  Diagnosis Date  . Arthritis   . Asthma    09/07/16  . Diabetes mellitus, type 2 ([place])   . Hyperlipidemia   . Hypertension   . Hyperthyroidism   . Obesity hypoventilation syndrome ([place]) 06/20/2013  . Stroke [name])     Patient Active Problem List   Diagnosis Date Noted  . Normocytic normochromic anemia 09/07/2016  . Acute on chronic respiratory failure ([place])   . (HFpEF) heart failure with preserved ejection fraction ([place])   . Enteritis due to Clostridium difficile   . SBO (small bowel obstruction) 08/10/2016  . Collapse of left lung   . S/P percutaneous endoscopic gastrostomy (PEG) tube placement ([place])   . Tracheostomy status ([place])   . HCAP (healthcare-associated pneumonia) 07/16/2016  . Encephalopathy   . Palliative care encounter   . Abdominal pain   . Pressure injury of skin 06/24/2016  . Encounter for central line placement   . Acute respiratory failure ([place]) 06/21/2016  . Septic shock ([place]) 06/20/2016  . Chronic respiratory failure with hypoxia ([place]) 11/29/2015  . Essential hypertension   . Bacterial UTI  08/06/2015  . Sepsis ([place]) 08/06/2015  . Altered mental status   . Lactic acidosis 08/02/2015  . Acute encephalopathy 08/02/2015  . History of CVA (cerebrovascular accident) 08/02/2015  . Chronic systolic HF (heart failure) ([place]) 08/02/2015  . Cognitive impairment 08/02/2015  . AKI (acute kidney injury) ([place]) 02/08/2015  . Abnormality of gait 02/08/2015  . Hyperthyroidism 02/08/2015  . Chest pain 01/19/2014  . Obesity hypoventilation syndrome ([place]) 06/20/2013  . Hyperglycemia 06/28/2009  . HYPERLIPIDEMIA-MIXED 06/28/2009  . OVERWEIGHT/OBESITY 06/28/2009  . HYPERTENSION, BENIGN 06/28/2009  . CAD, NATIVE VESSEL 06/28/2009  . DYSPNEA 06/28/2009    Past Surgical History:  Procedure Laterality Date  . IR GENERIC HISTORICAL  07/12/2016   IR US GUIDE VASC ACCESS RIGHT 07/12/2016 [name], MD [facility]  . IR GENERIC HISTORICAL  07/12/2016   IR FLUORO GUIDE CV LINE RIGHT 07/12/2016 [name], MD [facility]  . IR GENERIC HISTORICAL  07/17/2016   IR GASTROSTOMY TUBE MOD SED 07/17/2016 [name], MD [facility]  . IR GENERIC HISTORICAL  08/23/2016   IR REMOVAL TUN CV CATH W/O FL 08/23/2016 [name], MD [facility]  . TRACHEOSTOMY TUBE PLACEMENT N/A 07/14/2016   Procedure: TRACHEOSTOMY, THYROID ISTHMUSECTOMY;  Surgeon: [name], MD;  Location: [facility];  Service: ENT;  Laterality: N/A;  . VENTRAL HERNIA REPAIR      OB History    No data available       Home Medications  Prior to Admission medications   Medication Sig Start Date End Date Taking? Authorizing Provider  amLODipine (NORVASC) 10 MG tablet Take 10 mg by mouth daily.    Historical Provider, MD  aspirin 81 MG chewable tablet Chew 81 mg by mouth daily.    Historical Provider, MD  atorvastatin (LIPITOR) 40 MG tablet Place 40 mg into feeding tube daily.     Historical Provider, MD  B Complex-C-Folic Acid (NEPHRO-VITE PO) Place 1 tablet into feeding tube daily.    Historical Provider, MD    budesonide (PULMICORT) 0.5 MG/2ML nebulizer solution Take 0.5 mg by nebulization 2 (two) times daily.    Historical Provider, MD  cholecalciferol (VITAMIN D) 1000 units tablet 2,000 Units See admin instructions. Per tube once a day    Historical Provider, MD  erythromycin (ERY-TAB) 500 MG EC tablet Take 500 mg by mouth 4 (four) times daily.    Historical Provider, MD  famotidine (PEPCID) 20 MG tablet Place 1 tablet (20 mg total) into feeding tube daily. 07/19/16   [name], NP  ferrous sulfate 300 (60 Fe) MG/5ML syrup Take 300 mg by mouth 2 (two) times daily with a meal.    Historical Provider, MD  heparin 5000 UNIT/ML injection Inject 1 mL (5,000 Units total) into the skin every 8 (eight) hours. 07/18/16   [name], NP  insulin glargine (LANTUS) 100 UNIT/ML injection Inject 10 Units into the skin at bedtime.    Historical Provider, MD  insulin lispro (HUMALOG) 100 UNIT/ML injection Inject 0-12 Units into the skin See admin instructions. Implement hypoglycemic protocol: BGL 70-150 = 0 units; 151-200 = 2 units; 201-250 = 4 units; 251-300 = 6 units; 301-350 = 8 units; 351-400 = 10 units; >400 = 12 units PLUS CALL DOCTOR & OBTAIN STAT LAB VERIFICATION, 5 units AC for cbg > 150    Historical Provider, MD  Melatonin 3 MG TABS Place 3 mg into feeding tube at bedtime.    Historical Provider, MD  methimazole (TAPAZOLE) 10 MG tablet Place 1 tablet (10 mg total) into feeding tube daily. 07/19/16   [name], NP  metoCLOPramide (REGLAN) 5 MG tablet Place 5 mg into feeding tube 3 (three) times daily.     Historical Provider, MD  metoprolol tartrate (LOPRESSOR) 25 mg/10 mL SUSP Place 10 mLs (25 mg total) into feeding tube 2 (two) times daily. 07/18/16   [name], NP  vancomycin (VANCOCIN) 50 mg/mL oral solution Take 2.5 mLs (125 mg total) by mouth every 12 (twelve) hours. 09/05/16 09/12/16  Virginia Crews, MD  vancomycin (VANCOCIN) 50 mg/mL oral solution Take 2.5 mLs (125 mg total) by  mouth daily. Patient not taking: Reported on 09/07/2016 09/12/16 09/19/16  Virginia Crews, MD  vancomycin (VANCOCIN) 50 mg/mL oral solution Take 2.5 mLs (125 mg total) by mouth every 6 (six) hours. Patient not taking: Reported on 09/07/2016 08/18/16   Virginia Crews, MD    Family History Family History  Problem Relation Age of Onset  . Diabetes Mother   . Cancer Neg Hx   . Heart disease Neg Hx   . Stroke Neg Hx     Social History Social History  Substance Use Topics  . Smoking status: Former Smoker    Packs/day: 0.25    Years: 30.00    Types: Cigarettes    Quit date: 09/25/2002  . Smokeless tobacco: Never Used  . Alcohol use No     Allergies   Penicillins and Lactose  intolerance (gi)   Review of Systems Review of Systems  Unable to perform ROS: Patient nonverbal  All other systems reviewed and are negative.    Physical Exam Updated Vital Signs BP 129/94 (BP Location: Left Arm)   Pulse 107   Temp 98.3 F (36.8 C) (Oral)   Resp 18   SpO2 99%   Physical Exam  Constitutional: She is oriented to person, place, and time. She appears well-developed and well-nourished. No distress.  HENT:  Head: Normocephalic and atraumatic.  Mouth/Throat: Oropharynx is clear and moist. No oropharyngeal exudate.  Eyes: Conjunctivae and EOM are normal. Pupils are equal, round, and reactive to light.  Neck: Normal range of motion. Neck supple.  No meningismus.  Cardiovascular: Normal rate, regular rhythm, normal heart sounds and intact distal pulses.   No murmur heard. Pulmonary/Chest: Breath sounds normal. No respiratory distress.  Lungs clear. No distress. No wheezes. No stridor. Tachycardic to the 120's. Small tracheostomy stoma.  Abdominal: Soft. There is no tenderness. There is no rebound and no guarding.  Musculoskeletal: Normal range of motion. She exhibits no edema or tenderness.  Neurological: She is alert and oriented to person, place, and time. No cranial nerve  deficit. She exhibits normal muscle tone. Coordination normal.   5/5 strength throughout. CN 2-12 intact.Equal grip strength.   Skin: Skin is warm.  Psychiatric: She has a normal mood and affect. Her behavior is normal.  Nursing note and vitals reviewed.    ED Treatments / Results  DIAGNOSTIC STUDIES: Oxygen Saturation is 99% on trach, normal by my interpretation.    COORDINATION OF CARE: 3:44 AM Discussed treatment plan with pt at bedside and pt agreed to plan.  3:45 AM New tracheostomy placed.  Labs (all labs ordered are listed, but only abnormal results are displayed) Labs Reviewed - No data to display  EKG  EKG Interpretation  Date/Time:  Sunday September 10 2016 05:51:30 EST Ventricular Rate:  116 PR Interval:    QRS Duration: 87 QT Interval:  327 QTC Calculation: 455 R Axis:   70 Text Interpretation:  Sinus tachycardia Ventricular premature complex Anteroseptal infarct, age indeterminate Lateral leads are also involved Rate faster Confirmed by Wyvonnia Dusky  MD, Cendy Oconnor [phone]) on 09/10/2016 8:01:27 AM       Radiology Dg Chest Portable 1 View  Result Date: 09/10/2016 CLINICAL DATA:  Initial evaluation for tracheostomy tube placement. EXAM: PORTABLE CHEST 1 VIEW COMPARISON:  Prior radiograph from 10/20/2015. FINDINGS: Tracheostomy tube in place, grossly in normal position overlying the upper airway. Cardiomegaly is stable. Mediastinal silhouette within normal limits. Lungs are hypoinflated. Bibasilar atelectasis/ bronchovascular crowding. No definite focal infiltrates. No pulmonary edema or pleural effusion. No pneumothorax. No acute osseous abnormality. IMPRESSION: 1. Tracheostomy tube in place overlying the upper airway. 2. Shallow lung inflation with mild bibasilar atelectasis/bronchovascular crowding. No other active cardiopulmonary disease identified. Electronically Signed   By: Jeannine Boga M.D.   On: 09/10/2016 04:26    Procedures Procedures (including critical  care time)  Medications Ordered in ED Medications - No data to display   Initial Impression / Assessment and Plan / ED Course  I have reviewed the triage vital signs and the nursing notes.  Pertinent labs & imaging results that were available during my care of the patient were reviewed by me and considered in my medical decision making (see chart for details).  Clinical Course   From ECF with tracheostomy displaced. No distress. No hypoxia. Lungs clear.  Tachycardic. Denies CP or SOB.  Attempts  to replace trach by RTs and myself were unsuccessful as stoma was very small and #4 cuffless shiley would not pass.  Dr Redmond Baseman was consulted and was able to place trach after progressive dilation of the stoma.  CXR confirms position.  Labs are stable.  HR Improved to 98 after IV fluids given. Patient denies SOB or CP and states she feels at her baseline.  Appears stable for transfer back to facility.    Final Clinical Impressions(s) / ED Diagnoses   Final diagnoses:  Tracheostomy malfunction [facility])    New Prescriptions New Prescriptions   No medications on file  I personally performed the services described in this documentation, which was scribed in my presence. The recorded information has been reviewed and is accurate.   Angelica Essex, MD 09/10/16 1016

## 2016-09-10 NOTE — ED Notes (Signed)
Secretary to notify [facility] of need to transport back to patient facility.

## 2016-09-11 ENCOUNTER — Emergency Department (HOSPITAL_COMMUNITY)
Admission: EM | Admit: 2016-09-11 | Discharge: 2016-09-11 | Disposition: A | Discharge status: Skilled Nursing Facility | Payer: Medicare Other | Attending: Emergency Medicine | Admitting: Emergency Medicine

## 2016-09-11 ENCOUNTER — Emergency Department (HOSPITAL_COMMUNITY): Payer: Medicare Other

## 2016-09-11 ENCOUNTER — Encounter (HOSPITAL_COMMUNITY): Payer: Self-pay

## 2016-09-11 DIAGNOSIS — Z7982 Long term (current) use of aspirin: Secondary | ICD-10-CM | POA: Insufficient documentation

## 2016-09-11 DIAGNOSIS — R112 Nausea with vomiting, unspecified: Secondary | ICD-10-CM

## 2016-09-11 DIAGNOSIS — E039 Hypothyroidism, unspecified: Secondary | ICD-10-CM | POA: Diagnosis not present

## 2016-09-11 DIAGNOSIS — R1111 Vomiting without nausea: Secondary | ICD-10-CM | POA: Diagnosis not present

## 2016-09-11 DIAGNOSIS — R042 Hemoptysis: Secondary | ICD-10-CM | POA: Insufficient documentation

## 2016-09-11 DIAGNOSIS — I10 Essential (primary) hypertension: Secondary | ICD-10-CM | POA: Insufficient documentation

## 2016-09-11 DIAGNOSIS — Z87891 Personal history of nicotine dependence: Secondary | ICD-10-CM | POA: Insufficient documentation

## 2016-09-11 DIAGNOSIS — E119 Type 2 diabetes mellitus without complications: Secondary | ICD-10-CM | POA: Insufficient documentation

## 2016-09-11 DIAGNOSIS — R1110 Vomiting, unspecified: Secondary | ICD-10-CM | POA: Diagnosis not present

## 2016-09-11 DIAGNOSIS — J45909 Unspecified asthma, uncomplicated: Secondary | ICD-10-CM | POA: Insufficient documentation

## 2016-09-11 DIAGNOSIS — K5900 Constipation, unspecified: Secondary | ICD-10-CM | POA: Diagnosis not present

## 2016-09-11 DIAGNOSIS — Z794 Long term (current) use of insulin: Secondary | ICD-10-CM | POA: Insufficient documentation

## 2016-09-11 DIAGNOSIS — Z79899 Other long term (current) drug therapy: Secondary | ICD-10-CM | POA: Insufficient documentation

## 2016-09-11 MED ORDER — ONDANSETRON HCL 4 MG/5ML PO SOLN: 4.0000 mg | Freq: Three times a day (TID) | ORAL | 0 refills | Status: DC | PRN

## 2016-09-11 MED ORDER — ONDANSETRON HCL 4 MG/2ML IJ SOLN
4.0000 mg | Freq: Once | INTRAMUSCULAR | Status: AC
Start: 2016-09-11 — End: 2016-09-11
  Administered 2016-09-11: 4 mg via INTRAVENOUS
  Filled 2016-09-11: qty 2

## 2016-09-11 MED ORDER — DOCUSATE SODIUM 50 MG/5ML PO LIQD: 100.0000 mg | Freq: Two times a day (BID) | ORAL | 2 refills | Status: DC

## 2016-09-11 NOTE — ED Provider Notes (Signed)
By signing my name below, I, Dolores Hoose, attest that this documentation has been prepared under the direction and in the presence of Becker, DO . Electronically Signed: Dolores Hoose, Scribe. 09/11/2016. 12:48 AM.  TIME SEEN: 1:14AM   CHIEF COMPLAINT: Hemoptysis  HPI: LEVEL 5 CAVEAT PATIENT NONVERBAL AT BASELINE  Angelica Beck is a 65 y.o. female brought in by ambulance, who presents to the Emergency Department having vomited blood from her trach. EMS state there was no blood on scene. Pt denies any current pain or SOB.   ROS: LEVEL 5 CAVEAT DUE TO PATIENT NONVERBAL AT BASELINE  PAST MEDICAL HISTORY/PAST SURGICAL HISTORY:  Past Medical History:  Diagnosis Date  . Arthritis   . Asthma    09/07/16  . Diabetes mellitus, type 2 ([place])   . Hyperlipidemia   . Hypertension   . Hyperthyroidism   . Obesity hypoventilation syndrome ([place]) 06/20/2013  . Stroke [facility])     MEDICATIONS:  Prior to Admission medications   Medication Sig Start Date End Date Taking? Authorizing Provider  amLODipine (NORVASC) 10 MG tablet Take 10 mg by mouth daily.    Historical Provider, MD  aspirin 81 MG chewable tablet Chew 81 mg by mouth daily.    Historical Provider, MD  atorvastatin (LIPITOR) 40 MG tablet Place 40 mg into feeding tube every evening.     Historical Provider, MD  B Complex-C-Folic Acid (NEPHRO-VITE PO) Place 1 tablet into feeding tube daily.    Historical Provider, MD  bisacodyl (DULCOLAX) 10 MG suppository Place 10 mg rectally as needed for moderate constipation.    Historical Provider, MD  budesonide (PULMICORT) 0.5 MG/2ML nebulizer solution Take 0.5 mg by nebulization 2 (two) times daily.    Historical Provider, MD  cholecalciferol (VITAMIN D) 1000 units tablet 1,000 Units See admin instructions. Per tube once a day     Historical Provider, MD  famotidine (PEPCID) 20 MG tablet Place 1 tablet (20 mg total) into feeding tube daily. 07/19/16   [name], NP  ferrous sulfate 300  (60 Fe) MG/5ML syrup Take 300 mg by mouth daily with breakfast.     Historical Provider, MD  heparin 5000 UNIT/ML injection Inject 1 mL (5,000 Units total) into the skin every 8 (eight) hours. 07/18/16   [name], NP  insulin glargine (LANTUS) 100 UNIT/ML injection Inject 15 Units into the skin at bedtime.     Historical Provider, MD  ipratropium-albuterol (DUONEB) 0.5-2.5 (3) MG/3ML SOLN Take 3 mLs by nebulization every 4 (four) hours as needed (for dyspnea).    Historical Provider, MD  Magnesium Hydroxide (MILK OF MAGNESIA PO) Take 30 mLs by mouth as needed (for constipation).    Historical Provider, MD  Melatonin 3 MG TABS Place 3 mg into feeding tube at bedtime.    Historical Provider, MD  methimazole (TAPAZOLE) 10 MG tablet Place 1 tablet (10 mg total) into feeding tube daily. 07/19/16   [name], NP  metoCLOPramide (REGLAN) 10 MG tablet Place 10 mg into feeding tube 3 (three) times daily.     Historical Provider, MD  metoprolol succinate (TOPROL-XL) 50 MG 24 hr tablet Take 50 mg by mouth daily. Take with or immediately following a meal.    Historical Provider, MD  metoprolol tartrate (LOPRESSOR) 25 mg/10 mL SUSP Place 10 mLs (25 mg total) into feeding tube 2 (two) times daily. Patient not taking: Reported on 09/10/2016 07/18/16   [name], NP  Sodium Phosphates (RA SALINE ENEMA RE)  Place 1 each rectally as needed (for constipation).    Historical Provider, MD  vancomycin (VANCOCIN) 50 mg/mL oral solution Take 2.5 mLs (125 mg total) by mouth every 12 (twelve) hours. 09/05/16 09/12/16  Virginia Crews, MD  vancomycin (VANCOCIN) 50 mg/mL oral solution Take 2.5 mLs (125 mg total) by mouth daily. Patient not taking: Reported on 09/10/2016 09/12/16 09/19/16  Virginia Crews, MD  vancomycin (VANCOCIN) 50 mg/mL oral solution Take 2.5 mLs (125 mg total) by mouth every 6 (six) hours. Patient not taking: Reported on 09/10/2016 08/18/16   Virginia Crews, MD    ALLERGIES:   Allergies  Allergen Reactions  . Penicillins Swelling    FACIAL SWELLING Has patient had a PCN reaction causing immediate rash, facial/tongue/throat swelling, SOB or lightheadedness with hypotension: Yes Has patient had a PCN reaction causing severe rash involving mucus membranes or skin necrosis: No Has patient had a PCN reaction that required hospitalization: No Has patient had a PCN reaction occurring within the last 10 years: No If all of the above answers are "NO", then may proceed with Cephalosporin use.   . Lactose Intolerance (Gi) Diarrhea and Nausea And Vomiting    SOCIAL HISTORY:  Social History  Substance Use Topics  . Smoking status: Former Smoker    Packs/day: 0.25    Years: 30.00    Types: Cigarettes    Quit date: 09/25/2002  . Smokeless tobacco: Never Used  . Alcohol use No    FAMILY HISTORY: Family History  Problem Relation Age of Onset  . Diabetes Mother   . Cancer Neg Hx   . Heart disease Neg Hx   . Stroke Neg Hx     EXAM: BP 139/60 (BP Location: Right Arm)   Pulse 94   Temp 98.3 F (36.8 C) (Oral)   Resp 18   SpO2 99%  CONSTITUTIONAL: Elderly. Chronically ill-appearing. Nonverbal at baseline. HEAD: Normocephalic EYES: Conjunctivae clear, PERRL, EOMI ENT: normal nose; no rhinorrhea; moist mucous membranes NECK: trach In place without bleeding, drainage, swelling or tenderness to trach site.  Trachea midline. CARD: RRR; S1 and S2 appreciated; no murmurs, no clicks, no rubs, no gallops RESP: Normal chest excursion without splinting or tachypnea; breath sounds clear and equal bilaterally; no wheezes, no rhonchi, no rales, no hypoxia or respiratory distress, speaking full sentences ABD/GI: Normal bowel sounds; non-distended; soft, non-tender, no rebound, no guarding, no peritoneal signs, no hepatosplenomegaly, G-tube in the left upper abdomen with no surrounding erythema, warmth, drainage BACK:  The back appears normal and is non-tender to palpation,  there is no CVA tenderness EXT: Normal ROM in all joints; non-tender to palpation; no edema; normal capillary refill; no cyanosis, no calf tenderness or swelling    SKIN: Normal color for age and race; warm; no rash NEURO: Moves all extremities equally, sensation to light touch intact diffusely, cranial nerves II through XII intact, normal speech PSYCH: The patient's mood and manner are appropriate. Grooming and personal hygiene are appropriate.  MEDICAL DECISION MAKING: Patient here with bleeding from her trach per EMS reports. Patient just seen here 24 hours ago after her trach came out and had to have her stoma dilated by Dr. Redmond Baseman. Likely the cause of the bleeding noted. No bleeding currently. She has no shortness of breath and denies any pain. Lurline Idol is in place without surrounding drainage, swelling or tenderness. I did call the nursing staff at [facility] facility and they state that she had 2 episodes of vomiting that had "bloody particulate  in it". Patient denies abdominal pain. No diarrhea. Abdomen is soft and nontender. Distally was concerned because she has had a history of bowel obstruction and ileus before. No further vomiting here. We'll give Zofran and monitor patient. Will repeat acute abdominal series and monitor.  Staff at [facility] report her temperature was 99.6. She is afebrile here.   Recently had blood work in the past 24 hours that was unremarkable. I do not feel this needs to be repeated.  ED PROGRESS: Patient's x-ray show that she has a nonspecific bowel gas pattern with moderate amount of retained bowel stool.  Patient's abdominal exam is still very benign. No further vomiting here. We have suctioned her trach without difficulty and seen no blood. She again denies chest pain, shortness of breath, abdominal pain. We'll discharge with liquid Zofran and liquid Colace. I have updated her daughter [name] and discussed return precautions. Daughter is comfortable with the plan for  discharge back to her nursing facility. Patient wants to be discharged back to her nursing facility. We will call [facility] for discharge.   At this time, I do not feel there is any life-threatening condition present. I have reviewed and discussed all results (EKG, imaging, lab, urine as appropriate) and exam findings with patient/family. I have reviewed nursing notes and appropriate previous records.  I feel the patient is safe to be discharged home without further emergent workup and can continue workup as an outpatient as needed. Discussed usual and customary return precautions. Patient/family verbalize understanding and are comfortable with this plan.  Outpatient follow-up has been provided. All questions have been answered.      I personally performed the services described in this documentation, which was scribed in my presence. The recorded information has been reviewed and is accurate.    [place], DO 09/11/16 [phone]

## 2016-09-11 NOTE — ED Notes (Signed)
Per EDP, RT to suction pt trach. RT called and confirmed.

## 2016-09-11 NOTE — ED Notes (Signed)
Daughter called to discuss status of the Pt. She stated the PT has a trach and unable to use phone in room. Would like the nurse or Doc to call and give update.   [name] (daughter) [phone]

## 2016-09-11 NOTE — ED Notes (Signed)
[name] (daughter) [phone]

## 2016-09-11 NOTE — ED Triage Notes (Addendum)
Pt arrives via EMS from [place]. Per [facility] staff, pt was "vomiting blood from her trach" and has had a fever. Per EMS, no blood noted on scene or en route. Per EMS, [facility] staff reports they attempted to suction the blood from trach but were unable to. Per EMS, no blood noted in the suction container. Pt denies pain. EMS VS 120/72, [id number]. Per EMS, pt in no distress, lung sounds clear bilaterally. Pt A&O x2 at baseline. Non verbal during triage but responds appropriately to questions with head nods. No blood noted at trach site. Afebrile at 98.3 degrees.

## 2016-09-11 NOTE — ED Notes (Signed)
Pt transported to XR.  

## 2016-09-11 NOTE — ED Notes (Addendum)
Report called [facility], per [place], nurse caring for patient she was unable to pass suction catheter into trach before pt left facility. Per RT pt was suctioned with 25f catheter.  [facility] called for transport back to [facility]

## 2016-09-11 NOTE — ED Notes (Signed)
RT at bedside.

## 2016-09-11 NOTE — Progress Notes (Signed)
Called to patient's room to suction. Tracheally suctioned patient using sterile technique with return of small clearish white secretions. RT will place humidified air on trach via trach collar. RT will continue to monitor.

## 2016-09-11 NOTE — ED Notes (Signed)
Pt. returned from XR. 

## 2016-09-21 ENCOUNTER — Non-Acute Institutional Stay (SKILLED_NURSING_FACILITY): Payer: Medicare Other | Admitting: Internal Medicine

## 2016-09-21 ENCOUNTER — Encounter: Payer: Self-pay | Admitting: Internal Medicine

## 2016-09-21 ENCOUNTER — Emergency Department (HOSPITAL_COMMUNITY)
Admission: EM | Admit: 2016-09-21 | Discharge: 2016-09-22 | Disposition: A | Discharge status: Home / Self Care | Payer: Medicare Other | Attending: Emergency Medicine | Admitting: Emergency Medicine

## 2016-09-21 ENCOUNTER — Encounter (HOSPITAL_COMMUNITY): Payer: Self-pay | Admitting: Emergency Medicine

## 2016-09-21 DIAGNOSIS — Z8673 Personal history of transient ischemic attack (TIA), and cerebral infarction without residual deficits: Secondary | ICD-10-CM | POA: Insufficient documentation

## 2016-09-21 DIAGNOSIS — J9503 Malfunction of tracheostomy stoma: Secondary | ICD-10-CM | POA: Diagnosis not present

## 2016-09-21 DIAGNOSIS — A0472 Enterocolitis due to Clostridium difficile, not specified as recurrent: Secondary | ICD-10-CM

## 2016-09-21 DIAGNOSIS — I110 Hypertensive heart disease with heart failure: Secondary | ICD-10-CM | POA: Insufficient documentation

## 2016-09-21 DIAGNOSIS — D649 Anemia, unspecified: Secondary | ICD-10-CM

## 2016-09-21 DIAGNOSIS — R739 Hyperglycemia, unspecified: Secondary | ICD-10-CM | POA: Diagnosis not present

## 2016-09-21 DIAGNOSIS — Z87891 Personal history of nicotine dependence: Secondary | ICD-10-CM | POA: Insufficient documentation

## 2016-09-21 DIAGNOSIS — E662 Morbid (severe) obesity with alveolar hypoventilation: Secondary | ICD-10-CM | POA: Diagnosis not present

## 2016-09-21 DIAGNOSIS — Z930 Tracheostomy status: Secondary | ICD-10-CM | POA: Diagnosis not present

## 2016-09-21 DIAGNOSIS — I5022 Chronic systolic (congestive) heart failure: Secondary | ICD-10-CM | POA: Diagnosis not present

## 2016-09-21 DIAGNOSIS — K56609 Unspecified intestinal obstruction, unspecified as to partial versus complete obstruction: Secondary | ICD-10-CM | POA: Diagnosis not present

## 2016-09-21 DIAGNOSIS — J45909 Unspecified asthma, uncomplicated: Secondary | ICD-10-CM | POA: Diagnosis not present

## 2016-09-21 DIAGNOSIS — Z430 Encounter for attention to tracheostomy: Secondary | ICD-10-CM

## 2016-09-21 DIAGNOSIS — I2510 Atherosclerotic heart disease of native coronary artery without angina pectoris: Secondary | ICD-10-CM | POA: Insufficient documentation

## 2016-09-21 DIAGNOSIS — E119 Type 2 diabetes mellitus without complications: Secondary | ICD-10-CM | POA: Insufficient documentation

## 2016-09-21 DIAGNOSIS — Z7982 Long term (current) use of aspirin: Secondary | ICD-10-CM | POA: Insufficient documentation

## 2016-09-21 NOTE — Progress Notes (Signed)
[facility] Room: [place] [place] [address]  This is a nursing facility follow up for specific acute issue of abdominal changes as per family.  Interim medical record and care since last Why visit was updated with review of diagnostic studies and change in clinical status since last visit were documented.  HPI: Her daughter noted her abdomen to be "hard". Nurse assessment found bowel sounds to be slow and sluggish. Clinical evaluation was requested as she has had a history of small bowel obstruction.She has had ventral hernia repair ; tracheostomy 07/14/16 and gastrostomy tube placement 10/23. She has Dulcolax ordered as needed for moderate constipation. She takes Colace daily & is also on ferrous sulfate twice a day. Most recent labs 12/17 revealed renal insufficiency with a creatinine 1.41 & GFR  45. Normochromic, normocytic anemia was present with hematocrit 29.9, significantly improved from 12/11. She also had minimal hypercalcemia with a value of 10.4.  Staff reports sugars have been ranging from 200 to greater than 300. She is on Lantus 15 units without sliding scale. A1c was nondiabetic with a value of 5.4% on 11/18. In September it had been 5.8. She receives tube feedings from 7 PM to 7 AM. According to staff patient is supposed to be on a dysphagia diet but the family brings in whatever she wants to eat. Swallowing study has been delayed pending evaluation for any residual  C. difficile. Vancomycin has been discontinued. Stools are now reported as soft but not diarrheal. ENT had to replace her trach 09/10/16 when it became displaced. Dilation was required prior to replacement.Staff reports each she does pull at her trach.  Past history includes stroke, obesity hypoventilation syndrome, hyper thyroidism, hypertension, diabetes, asthma, and dyslipidemia.  Review of systems: The patient responds by yes or no  head movement. She denies any active GI, cardiac, GU, pulmonary symptoms.She specifically denies diarrhea,dark urine, or clay-colored stools.  Because all answers were negative nods of the head,I asked her if the sun came up in [place]. She nodded positively.  Constitutional: No fever,significant weight change, fatigue  Eyes: No redness, discharge, pain, vision change ENT/mouth: No nasal congestion,  purulent discharge, earache,change in hearing ,sore throat  Cardiovascular: No chest pain, palpitations,paroxysmal nocturnal dyspnea, claudication, edema  Respiratory: No cough, sputum production,hemoptysis, DOE , significant snoring,apnea  Gastrointestinal: No heartburn,dysphagia,abdominal pain, nausea / vomiting,rectal bleeding, melena,change in bowels Genitourinary: No dysuria,hematuria, pyuria,  incontinence, nocturia Musculoskeletal: No joint stiffness, joint swelling, weakness,pain Dermatologic: No rash, pruritus, change in appearance of skin Endocrine: No change in hair/skin/ nails, excessive thirst, excessive hunger, excessive urination  Hematologic/lymphatic: No significant bruising, lymphadenopathy,abnormal bleeding Allergy/immunology: No itchy/ watery eyes, significant sneezing, urticaria, angioedema  Physical exam:  Pertinent or positive findings: she's edentulous. Tracheostomy present. Breath sounds are decreased but chest is essentially clear. Sounds are distant.  PEG is present. Slight dullness to percussion the right upper quadrant but no other means. Specifically there is no ileus.  Pedal pulses decreased. She was able to sit up with assist.   General appearance:Adequately nourished; no acute distress , increased work of breathing is present.   Lymphatic: No lymphadenopathy about the head, neck, axilla . Eyes: No conjunctival inflammation or lid edema is present. There is no scleral icterus. Ears:  External ear exam shows no significant lesions or deformities.   Nose:  External  nasal examination shows no deformity or inflammation. Nasal mucosa are pink and moist without lesions ,exudates Oral  exam: lips and gums are healthy appearing.There is no oropharyngeal erythema or exudate . Neck:  No masses, tenderness noted.    Heart:  No gallop, murmur, click, rub .  Lungs:Chest  without wheezes, rhonchi,rales , rubs. Abdomen:Bowel sounds are normal. Abdomen is soft and nontender with no organomegaly, hernias,masses. GU: deferred  Extremities:  No cyanosis, clubbing,edema  Skin: Warm & dry w/o tenting. No significant lesions or rash.  See summary under each active problem in the Problem List with associated updated therapeutic plan

## 2016-09-21 NOTE — Assessment & Plan Note (Addendum)
No clinical evidence of ileus at this time. Exam on 12/28 was normal except for the PEG tube. No diarrheal stools reported. Barium swallow will be performed to assess present aspiration risk as family is bringing in items not on the dysphagia diet despite at least two staff conferences concerning risks

## 2016-09-21 NOTE — Patient Instructions (Signed)
See Current Assessment & Plan in Problem List under specific Diagnosis 

## 2016-09-21 NOTE — Assessment & Plan Note (Signed)
Hematocrit improved significantly

## 2016-09-21 NOTE — ED Provider Notes (Signed)
[facility] DEPT Provider Note   CSN: [id number] Arrival date & time: 09/21/16  2016     History   Chief Complaint Chief Complaint  Patient presents with  . Tracheostomy Tube Change    HPI Angelica Beck is a 65 y.o. female.  The history is provided by the patient and medical records. No language interpreter was used.     Patient is a 65 year old female with a past medical history diabetes, trach dependence due to obesity hypoventilation syndrome who presents today after point her trach out. She states that while she was asleep it came out. Last seen with trach was around 3 PM today. Patient resides at [facility] home. She denies any pain with this. She denies any difficulty breathing. She endorses no further issues today.  Past Medical History:  Diagnosis Date  . Arthritis   . Asthma    09/07/16  . Diabetes mellitus, type 2 ([place])   . Hyperlipidemia   . Hypertension   . Hyperthyroidism   . Obesity hypoventilation syndrome ([place]) 06/20/2013  . Stroke [facility])     Patient Active Problem List   Diagnosis Date Noted  . Normocytic normochromic anemia 09/07/2016  . Acute on chronic respiratory failure ([place])   . (HFpEF) heart failure with preserved ejection fraction ([place])   . Enteritis due to Clostridium difficile   . SBO (small bowel obstruction) 08/10/2016  . Collapse of left lung   . S/P percutaneous endoscopic gastrostomy (PEG) tube placement ([place])   . Tracheostomy status ([place])   . HCAP (healthcare-associated pneumonia) 07/16/2016  . Encephalopathy   . Palliative care encounter   . Abdominal pain   . Pressure injury of skin 06/24/2016  . Encounter for central line placement   . Acute respiratory failure ([place]) 06/21/2016  . Septic shock ([place]) 06/20/2016  . Chronic respiratory failure with hypoxia ([place]) 11/29/2015  . Essential hypertension   . Bacterial UTI 08/06/2015  . Sepsis ([place]) 08/06/2015  . Altered mental status   . Lactic acidosis 08/02/2015  .  Acute encephalopathy 08/02/2015  . History of CVA (cerebrovascular accident) 08/02/2015  . Chronic systolic HF (heart failure) ([place]) 08/02/2015  . Cognitive impairment 08/02/2015  . AKI (acute kidney injury) ([place]) 02/08/2015  . Abnormality of gait 02/08/2015  . Hyperthyroidism 02/08/2015  . Chest pain 01/19/2014  . Obesity hypoventilation syndrome ([place]) 06/20/2013  . Hyperglycemia 06/28/2009  . HYPERLIPIDEMIA-MIXED 06/28/2009  . OVERWEIGHT/OBESITY 06/28/2009  . HYPERTENSION, BENIGN 06/28/2009  . CAD, NATIVE VESSEL 06/28/2009  . DYSPNEA 06/28/2009    Past Surgical History:  Procedure Laterality Date  . IR GENERIC HISTORICAL  07/12/2016   IR US GUIDE VASC ACCESS RIGHT 07/12/2016 [name], MD [facility]  . IR GENERIC HISTORICAL  07/12/2016   IR FLUORO GUIDE CV LINE RIGHT 07/12/2016 [name], MD [facility]  . IR GENERIC HISTORICAL  07/17/2016   IR GASTROSTOMY TUBE MOD SED 07/17/2016 [name], MD [facility]  . IR GENERIC HISTORICAL  08/23/2016   IR REMOVAL TUN CV CATH W/O FL 08/23/2016 [name], MD [facility]  . TRACHEOSTOMY TUBE PLACEMENT N/A 07/14/2016   Procedure: TRACHEOSTOMY, THYROID ISTHMUSECTOMY;  Surgeon: [name], MD;  Location: [facility];  Service: ENT;  Laterality: N/A;  . VENTRAL HERNIA REPAIR      OB History    No data available       Home Medications    Prior to Admission medications   Medication Sig Start Date End Date Taking? Authorizing Provider  AMBULATORY NON FORMULARY  MEDICATION Give 45 mL/hr by tube See admin instructions. Tube feedings from 7p-7a only Novosource @@ 35m/hr via peg and free water flushes 331mevery 3hrs.   Yes Historical Provider, MD  amLODipine (NORVASC) 10 MG tablet Take 10 mg by mouth daily.   Yes Historical Provider, MD  aspirin 81 MG chewable tablet Chew 81 mg by mouth daily.   Yes Historical Provider, MD  atorvastatin (LIPITOR) 40 MG tablet Place 40 mg into feeding tube every evening.    Yes  Historical Provider, MD  b complex-vitamin c-folic acid (NEPHRO-VITE) 0.8 MG TABS tablet Take 1 tablet by mouth every morning.   Yes Historical Provider, MD  bisacodyl (DULCOLAX) 10 MG suppository Place 10 mg rectally as needed for moderate constipation.   Yes Historical Provider, MD  budesonide (PULMICORT) 0.5 MG/2ML nebulizer solution Take 0.5 mg by nebulization 2 (two) times daily.   Yes Historical Provider, MD  cholecalciferol (VITAMIN D) 1000 units tablet Take 1,000 Units by mouth daily.   Yes Historical Provider, MD  docusate sodium (COLACE) 100 MG capsule Take 100 mg by mouth daily.   Yes Historical Provider, MD  famotidine (PEPCID) 20 MG tablet Place 1 tablet (20 mg total) into feeding tube daily. 07/19/16  Yes BrDonita BrooksNP  ferrous sulfate 325 (65 FE) MG tablet Take 325 mg by mouth 2 (two) times daily with a meal.   Yes Historical Provider, MD  heparin 5000 UNIT/ML injection Inject 1 mL (5,000 Units total) into the skin every 8 (eight) hours. 07/18/16  Yes BrDonita BrooksNP  insulin glargine (LANTUS) 100 UNIT/ML injection Inject 15 Units into the skin at bedtime.    Yes Historical Provider, MD  ipratropium-albuterol (DUONEB) 0.5-2.5 (3) MG/3ML SOLN Take 3 mLs by nebulization every 4 (four) hours as needed (for dyspnea).   Yes Historical Provider, MD  Melatonin 3 MG TABS Place 3 mg into feeding tube at bedtime.   Yes Historical Provider, MD  methimazole (TAPAZOLE) 10 MG tablet Place 1 tablet (10 mg total) into feeding tube daily. 07/19/16  Yes BrDonita BrooksNP  metoCLOPramide (REGLAN) 10 MG tablet Place 10 mg into feeding tube 3 (three) times daily.    Yes Historical Provider, MD  metoprolol (LOPRESSOR) 50 MG tablet Take 50 mg by mouth 2 (two) times daily.   Yes Historical Provider, MD  ondansetron (ZOFRAN) 4 MG tablet Take 4 mg by mouth every 8 (eight) hours as needed for nausea or vomiting.   Yes Historical Provider, MD  Sodium Phosphates (RA SALINE ENEMA RE) Place 1 each rectally  as needed (for constipation).   Yes Historical Provider, MD    Family History Family History  Problem Relation Age of Onset  . Diabetes Mother   . Cancer Neg Hx   . Heart disease Neg Hx   . Stroke Neg Hx     Social History Social History  Substance Use Topics  . Smoking status: Former Smoker    Packs/day: 0.25    Years: 30.00    Types: Cigarettes    Quit date: 09/25/2002  . Smokeless tobacco: Never Used  . Alcohol use No     Allergies   Penicillins and Lactose intolerance (gi)   Review of Systems Review of Systems  Constitutional: Negative for chills and fever.  HENT: Negative for congestion.   Respiratory: Negative for shortness of breath.        4 cuffless shiley out  All other systems reviewed and are negative.    Physical  Exam Updated Vital Signs BP (!) 120/102 (BP Location: Right Arm)   Pulse 97   Temp 98.6 F (37 C) (Oral)   Resp 15   SpO2 100%   Physical Exam  Constitutional: No distress.  HENT:  Head: Normocephalic and atraumatic.  Eyes: Conjunctivae and EOM are normal.  Neck: Normal range of motion.  Stoma site appears clean, without erythema or drainage.   Cardiovascular: Normal rate and regular rhythm.   Pulmonary/Chest: Effort normal. No respiratory distress.  Abdominal: Soft. She exhibits no distension. There is no tenderness.  Musculoskeletal: Normal range of motion. She exhibits no edema.  Neurological: She is alert.  Skin: Skin is warm and dry. She is not diaphoretic.  Nursing note and vitals reviewed.    ED Treatments / Results  Labs (all labs ordered are listed, but only abnormal results are displayed) Labs Reviewed - No data to display  EKG  EKG Interpretation None       Radiology No results found.  Procedures Procedures (including critical care time)  Medications Ordered in ED Medications - No data to display   Initial Impression / Assessment and Plan / ED Course  I have reviewed the triage vital signs and the  nursing notes.  Pertinent labs & imaging results that were available during my care of the patient were reviewed by me and considered in my medical decision making (see chart for details).  Clinical Course     Patient is a 65 year old female who presents today with tracheostomy tube coming out. I attempted to replace it with her known for Shiley. I was unable to place this due to a lot of resistance with passing it. At this point, I consulted with your nose and throat specialist who will come in and attempt to place the tube. Nursing has been advised to place urethral dilators and viscous lidocaine at the bedside.  Patient remained stable while awaiting ENT consult for management.  Care transferred at midnight.   Final Clinical Impressions(s) / ED Diagnoses   Final diagnoses:  None    New Prescriptions New Prescriptions   No medications on file     Theodosia Quay, MD 09/21/16 [place], MD 09/22/16 [id number]

## 2016-09-21 NOTE — Assessment & Plan Note (Signed)
Off vancomycin with no documented diarrheal stools Fluorstar will be initiated

## 2016-09-21 NOTE — ED Triage Notes (Signed)
Pt arrives via [facility] from [facility] where patient pulled out trach. Last seen and assessed with trach at 3pm today. Pt awake, alert, nonverbal, sats 100% on 2L, follows commands. No resp distress noted. VSS.

## 2016-09-21 NOTE — Assessment & Plan Note (Signed)
Long acting insulin will be titrated to cover the glucose swings. Sliding scale will be avoided based on existing standards of care.

## 2016-09-21 NOTE — ED Notes (Signed)
Daughter Gen[place] Freyre [phone]

## 2016-09-22 MED ORDER — LIDOCAINE VISCOUS 2 % MT SOLN
15.0000 mL | Freq: Once | OROMUCOSAL | Status: AC
  Administered 2016-09-22: 15 mL via OROMUCOSAL
  Filled 2016-09-22: qty 15

## 2016-09-22 NOTE — Consult Note (Signed)
Reason for Consult: Tracheostomy dislodged Referring Physician: Provider Default, MD  Angelica Beck is an 65 y.o. female.  HPI: 3:00 yesterday afternoon, tracheostomy came out. She came to emergency department late in the evening. She has tracheostomy for chronic hypoventilation syndrome. She is in no distress currently. Not able to give any history. She lives in a nursing home.  Past Medical History:  Diagnosis Date  . Arthritis   . Asthma    09/07/16  . Diabetes mellitus, type 2 ([place])   . Hyperlipidemia   . Hypertension   . Hyperthyroidism   . Obesity hypoventilation syndrome ([place]) 06/20/2013  . Stroke [facility])     Past Surgical History:  Procedure Laterality Date  . IR GENERIC HISTORICAL  07/12/2016   IR US GUIDE VASC ACCESS RIGHT 07/12/2016 [name], MD [facility]  . IR GENERIC HISTORICAL  07/12/2016   IR FLUORO GUIDE CV LINE RIGHT 07/12/2016 [name], MD [facility]  . IR GENERIC HISTORICAL  07/17/2016   IR GASTROSTOMY TUBE MOD SED 07/17/2016 [name], MD [facility]  . IR GENERIC HISTORICAL  08/23/2016   IR REMOVAL TUN CV CATH W/O FL 08/23/2016 [name], MD [facility]  . TRACHEOSTOMY TUBE PLACEMENT N/A 07/14/2016   Procedure: TRACHEOSTOMY, THYROID ISTHMUSECTOMY;  Surgeon: [name], MD;  Location: [facility] OR;  Service: ENT;  Laterality: N/A;  . VENTRAL HERNIA REPAIR      Family History  Problem Relation Age of Onset  . Diabetes Mother   . Cancer Neg Hx   . Heart disease Neg Hx   . Stroke Neg Hx     Social History:  reports that she quit smoking about 14 years ago. Her smoking use included Cigarettes. She has a 7.50 pack-year smoking history. She has never used smokeless tobacco. She reports that she does not drink alcohol or use drugs.  Allergies:  Allergies  Allergen Reactions  . Penicillins Swelling    FACIAL SWELLING Has patient had a PCN reaction causing immediate rash, facial/tongue/throat swelling, SOB or lightheadedness  with hypotension: Yes Has patient had a PCN reaction causing severe rash involving mucus membranes or skin necrosis: No Has patient had a PCN reaction that required hospitalization: No Has patient had a PCN reaction occurring within the last 10 years: No If all of the above answers are "NO", then may proceed with Cephalosporin use.   . Lactose Intolerance (Gi) Diarrhea and Nausea And Vomiting    Medications: Reviewed  No results found for this or any previous visit (from the past 48 hour(s)).  No results found.  ACZ:[id number] except as listed in admit H&P  Blood pressure 144/72, pulse 102, temperature 98.6 F (37 C), temperature source Oral, resp. rate 20, SpO2 100 %.  PHYSICAL EXAM: Overall appearance:  Elderly obese lady, in no distress Head:  Normocephalic, atraumatic. Ears: External ears look healthy. Nose: External nose is healthy in appearance. Internal nasal exam free of any lesions or obstruction. Oral Cavity/Pharynx:  There are no mucosal lesions or masses identified. Larynx/Hypopharynx: Deferred Neuro:  No identifiable neurologic deficits. Neck: No palpable neck masses. Tracheostomy site identified.  Studies Reviewed: none  Procedures: Replacement of tracheostomy. Viscous lidocaine was used for lubrication topical anesthetic. Urethral sounds were used to dilate the stoma up to #30. A #4 Shiley tracheostomy was then placed without difficulty. This was secured in place with a Velcro straps. Airway was stable.   Assessment/Plan: Tracheostomy replaced. Stable airway. Transfer back to nursing home. Follow-up when necessary.  [name] 09/22/2016,  7:04 AM

## 2016-09-22 NOTE — ED Provider Notes (Signed)
Tracheostomy has been replaced by Dr. Constance Holster this morning without difficulty. Patient ready for discharge.   Orpah Greek, MD 09/22/16 [phone]

## 2016-09-22 NOTE — ED Notes (Signed)
Called [facility] 

## 2016-09-22 NOTE — ED Notes (Signed)
Updated pt's daughter on disposition and plan of care.  She will follow up with Dr Constance Holster.

## 2016-09-22 NOTE — ED Notes (Signed)
Spoke with Dr. Betsey Holiday for secretary to follow up with ENT to know the plans for this pt

## 2016-09-22 NOTE — ED Notes (Addendum)
RT suctioning pt prior to pt leaving.

## 2016-09-23 ENCOUNTER — Encounter (HOSPITAL_COMMUNITY): Payer: Self-pay | Admitting: *Deleted

## 2016-09-23 ENCOUNTER — Emergency Department (HOSPITAL_COMMUNITY)
Admission: EM | Admit: 2016-09-23 | Discharge: 2016-09-23 | Disposition: A | Discharge status: Home / Self Care | Payer: Medicare Other | Attending: Emergency Medicine | Admitting: Emergency Medicine

## 2016-09-23 DIAGNOSIS — Z794 Long term (current) use of insulin: Secondary | ICD-10-CM | POA: Insufficient documentation

## 2016-09-23 DIAGNOSIS — J45909 Unspecified asthma, uncomplicated: Secondary | ICD-10-CM | POA: Diagnosis not present

## 2016-09-23 DIAGNOSIS — Z87891 Personal history of nicotine dependence: Secondary | ICD-10-CM | POA: Insufficient documentation

## 2016-09-23 DIAGNOSIS — Z8673 Personal history of transient ischemic attack (TIA), and cerebral infarction without residual deficits: Secondary | ICD-10-CM | POA: Insufficient documentation

## 2016-09-23 DIAGNOSIS — J9503 Malfunction of tracheostomy stoma: Secondary | ICD-10-CM | POA: Diagnosis not present

## 2016-09-23 DIAGNOSIS — I110 Hypertensive heart disease with heart failure: Secondary | ICD-10-CM | POA: Insufficient documentation

## 2016-09-23 DIAGNOSIS — I5022 Chronic systolic (congestive) heart failure: Secondary | ICD-10-CM | POA: Insufficient documentation

## 2016-09-23 DIAGNOSIS — E119 Type 2 diabetes mellitus without complications: Secondary | ICD-10-CM | POA: Insufficient documentation

## 2016-09-23 DIAGNOSIS — Z7982 Long term (current) use of aspirin: Secondary | ICD-10-CM | POA: Insufficient documentation

## 2016-09-23 NOTE — Procedures (Signed)
Tracheostomy Change Note  Patient Details:   Name: Angelica Beck DOB: 1951-03-16 MRN: [id number]    Airway Documentation:     Evaluation  O2 sats: stable throughout Complications: No apparent complications Patient did tolerate procedure well.  Pt. arrived ED with 4.0 Shiley Cuffless Trach that had been dislodged, placed in Trauma-C, upon my arrival pt. had 100% sats, HR-111, RR-18  in no noted distress, staff had obtained another 4.0 Cuffless Shiley Trach prior to my arrival, RT obtained DALE Trach ties, EtC02 Cap, #10 suction catheter, Sterile gloves and Sterile dressing, MD/RT were able to replaced Trach with mild resistance after dilating and using Seldinger Technique, (+) ETC02 reading with b/l b.s. s/p procedure, pt. tolerated well, 100% sats t/o, RT to monitor-d/c.    Winferd Humphrey 09/23/2016, 4:57 AM

## 2016-09-23 NOTE — ED Provider Notes (Signed)
[facility] DEPT Provider Note   CSN: [id number] Arrival date & time: 09/23/16  0345     History   Chief Complaint Chief Complaint  Patient presents with  . Tracheostomy Tube Change    HPI Angelica Beck is a 65 y.o. female.  65 yo F with a cc of trach dislodgement.  Occurred this evening, trochar placed in its stead by nursing home. No current complaints.  Level V caveat non verbal.    The history is provided by the EMS personnel.  Illness  This is a new problem. The current episode started 3 to 5 hours ago. The problem occurs constantly. The problem has not changed since onset.Pertinent negatives include no chest pain, no headaches and no shortness of breath. Nothing aggravates the symptoms. Nothing relieves the symptoms. She has tried nothing for the symptoms. The treatment provided no relief.    Past Medical History:  Diagnosis Date  . Arthritis   . Asthma    09/07/16  . Diabetes mellitus, type 2 ([place])   . Hyperlipidemia   . Hypertension   . Hyperthyroidism   . Obesity hypoventilation syndrome ([place]) 06/20/2013  . Stroke [facility])     Patient Active Problem List   Diagnosis Date Noted  . Normocytic normochromic anemia 09/07/2016  . Acute on chronic respiratory failure ([place])   . (HFpEF) heart failure with preserved ejection fraction ([place])   . Enteritis due to Clostridium difficile   . SBO (small bowel obstruction) 08/10/2016  . Collapse of left lung   . S/P percutaneous endoscopic gastrostomy (PEG) tube placement ([place])   . Tracheostomy status ([place])   . HCAP (healthcare-associated pneumonia) 07/16/2016  . Encephalopathy   . Palliative care encounter   . Abdominal pain   . Pressure injury of skin 06/24/2016  . Encounter for central line placement   . Acute respiratory failure ([place]) 06/21/2016  . Septic shock ([place]) 06/20/2016  . Chronic respiratory failure with hypoxia ([place]) 11/29/2015  . Essential hypertension   . Bacterial UTI 08/06/2015  . Sepsis ([place])  08/06/2015  . Altered mental status   . Lactic acidosis 08/02/2015  . Acute encephalopathy 08/02/2015  . History of CVA (cerebrovascular accident) 08/02/2015  . Chronic systolic HF (heart failure) ([place]) 08/02/2015  . Cognitive impairment 08/02/2015  . AKI (acute kidney injury) ([place]) 02/08/2015  . Abnormality of gait 02/08/2015  . Hyperthyroidism 02/08/2015  . Chest pain 01/19/2014  . Obesity hypoventilation syndrome ([place]) 06/20/2013  . Hyperglycemia 06/28/2009  . HYPERLIPIDEMIA-MIXED 06/28/2009  . OVERWEIGHT/OBESITY 06/28/2009  . HYPERTENSION, BENIGN 06/28/2009  . CAD, NATIVE VESSEL 06/28/2009  . DYSPNEA 06/28/2009    Past Surgical History:  Procedure Laterality Date  . IR GENERIC HISTORICAL  07/12/2016   IR US GUIDE VASC ACCESS RIGHT 07/12/2016 [name], MD [facility]  . IR GENERIC HISTORICAL  07/12/2016   IR FLUORO GUIDE CV LINE RIGHT 07/12/2016 [name], MD [facility]  . IR GENERIC HISTORICAL  07/17/2016   IR GASTROSTOMY TUBE MOD SED 07/17/2016 [name], MD [facility]  . IR GENERIC HISTORICAL  08/23/2016   IR REMOVAL TUN CV CATH W/O FL 08/23/2016 [name], MD [facility]  . TRACHEOSTOMY TUBE PLACEMENT N/A 07/14/2016   Procedure: TRACHEOSTOMY, THYROID ISTHMUSECTOMY;  Surgeon: [name], MD;  Location: [facility];  Service: ENT;  Laterality: N/A;  . VENTRAL HERNIA REPAIR      OB History    No data available       Home Medications    Prior to Admission  medications   Medication Sig Start Date End Date Taking? Authorizing Provider  AMBULATORY NON FORMULARY MEDICATION Give 45 mL/hr by tube See admin instructions. Tube feedings from 7p-7a only Novosource @@ 1m/hr via peg and free water flushes 333mevery 3hrs.    Historical Provider, MD  amLODipine (NORVASC) 10 MG tablet Take 10 mg by mouth daily.    Historical Provider, MD  aspirin 81 MG chewable tablet Chew 81 mg by mouth daily.    Historical Provider, MD  atorvastatin (LIPITOR) 40 MG  tablet Place 40 mg into feeding tube every evening.     Historical Provider, MD  b complex-vitamin c-folic acid (NEPHRO-VITE) 0.8 MG TABS tablet Take 1 tablet by mouth every morning.    Historical Provider, MD  bisacodyl (DULCOLAX) 10 MG suppository Place 10 mg rectally as needed for moderate constipation.    Historical Provider, MD  budesonide (PULMICORT) 0.5 MG/2ML nebulizer solution Take 0.5 mg by nebulization 2 (two) times daily.    Historical Provider, MD  cholecalciferol (VITAMIN D) 1000 units tablet Take 1,000 Units by mouth daily.    Historical Provider, MD  docusate sodium (COLACE) 100 MG capsule Take 100 mg by mouth daily.    Historical Provider, MD  famotidine (PEPCID) 20 MG tablet Place 1 tablet (20 mg total) into feeding tube daily. 07/19/16   BrDonita BrooksNP  ferrous sulfate 325 (65 FE) MG tablet Take 325 mg by mouth 2 (two) times daily with a meal.    Historical Provider, MD  heparin 5000 UNIT/ML injection Inject 1 mL (5,000 Units total) into the skin every 8 (eight) hours. 07/18/16   BrDonita BrooksNP  insulin glargine (LANTUS) 100 UNIT/ML injection Inject 15 Units into the skin at bedtime.     Historical Provider, MD  ipratropium-albuterol (DUONEB) 0.5-2.5 (3) MG/3ML SOLN Take 3 mLs by nebulization every 4 (four) hours as needed (for dyspnea).    Historical Provider, MD  Melatonin 3 MG TABS Place 3 mg into feeding tube at bedtime.    Historical Provider, MD  methimazole (TAPAZOLE) 10 MG tablet Place 1 tablet (10 mg total) into feeding tube daily. 07/19/16   BrDonita BrooksNP  metoCLOPramide (REGLAN) 10 MG tablet Place 10 mg into feeding tube 3 (three) times daily.     Historical Provider, MD  metoprolol (LOPRESSOR) 50 MG tablet Take 50 mg by mouth 2 (two) times daily.    Historical Provider, MD  ondansetron (ZOFRAN) 4 MG tablet Take 4 mg by mouth every 8 (eight) hours as needed for nausea or vomiting.    Historical Provider, MD  Sodium Phosphates (RA SALINE ENEMA RE) Place 1  each rectally as needed (for constipation).    Historical Provider, MD    Family History Family History  Problem Relation Age of Onset  . Diabetes Mother   . Cancer Neg Hx   . Heart disease Neg Hx   . Stroke Neg Hx     Social History Social History  Substance Use Topics  . Smoking status: Former Smoker    Packs/day: 0.25    Years: 30.00    Types: Cigarettes    Quit date: 09/25/2002  . Smokeless tobacco: Never Used  . Alcohol use No     Allergies   Penicillins and Lactose intolerance (gi)   Review of Systems Review of Systems  Constitutional: Negative for chills and fever.  HENT: Negative for congestion and rhinorrhea.        Trach dislodged  Eyes: Negative for  redness and visual disturbance.  Respiratory: Negative for shortness of breath and wheezing.   Cardiovascular: Negative for chest pain and palpitations.  Gastrointestinal: Negative for nausea and vomiting.  Genitourinary: Negative for dysuria and urgency.  Musculoskeletal: Negative for arthralgias and myalgias.  Skin: Negative for pallor and wound.  Neurological: Negative for dizziness and headaches.     Physical Exam Updated Vital Signs BP 123/71 (BP Location: Right Arm)   Pulse 120   Temp 98.4 F (36.9 C) (Oral)   Resp 16   Ht '5\' 6"'$  (1.676 m)   Wt 227 lb (103 kg)   SpO2 100%   BMI 36.64 kg/m   Physical Exam  Constitutional: She is oriented to person, place, and time. She appears well-developed and well-nourished. No distress.  HENT:  Head: Normocephalic and atraumatic.  Trochar in place of trach   Eyes: EOM are normal. Pupils are equal, round, and reactive to light.  Neck: Normal range of motion. Neck supple.  Cardiovascular: Normal rate and regular rhythm.  Exam reveals no gallop and no friction rub.   No murmur heard. Pulmonary/Chest: Effort normal. She has no wheezes. She has no rales.  Abdominal: Soft. She exhibits no distension. There is no tenderness.  Musculoskeletal: She exhibits no  edema or tenderness.  Neurological: She is alert and oriented to person, place, and time.  Skin: Skin is warm and dry. She is not diaphoretic.  Psychiatric: She has a normal mood and affect. Her behavior is normal.  Nursing note and vitals reviewed.    ED Treatments / Results  Labs (all labs ordered are listed, but only abnormal results are displayed) Labs Reviewed - No data to display  EKG  EKG Interpretation None       Radiology No results found.  Procedures TRACHEOSTOMY REPLACEMENT Date/Time: 09/23/2016 4:49 AM Performed by: Tyrone Nine Karolina Zamor Authorized by: Deno Etienne  Consent: Verbal consent obtained. Risks and benefits: risks, benefits and alternatives were discussed Consent given by: patient Patient understanding: patient states understanding of the procedure being performed Patient consent: the patient's understanding of the procedure matches consent given Patient identity confirmed: verbally with patient Time out: Immediately prior to procedure a "time out" was called to verify the correct patient, procedure, equipment, support staff and site/side marked as required. Indications: became dislodged Local anesthesia used: no  Anesthesia: Local anesthesia used: no  Sedation: Patient sedated: no Tube type: single cannula Tube cuff: cuffless Tube size: 4.5 mm Cuff type: air Seldinger technique: Seldinger technique used (over bougie) Patient tolerance: Patient tolerated the procedure well with no immediate complications    (including critical care time)  Medications Ordered in ED Medications - No data to display   Initial Impression / Assessment and Plan / ED Course  I have reviewed the triage vital signs and the nursing notes.  Pertinent labs & imaging results that were available during my care of the patient were reviewed by me and considered in my medical decision making (see chart for details).  Clinical Course     65 yo F with a cc of trach dislogement,  replaced at bedside. PCP/ ENT follow up.     Medications given during this visit Medications - No data to display   The patient appears reasonably screen and/or stabilized for discharge and I doubt any other medical condition or other [facility] requiring further screening, evaluation, or treatment in the ED at this time prior to discharge.    Final Clinical Impressions(s) / ED Diagnoses   Final diagnoses:  Tracheostomy malfunction [facility])    New Prescriptions New Prescriptions   No medications on file     Deno Etienne, DO 09/23/16 [id number]

## 2016-09-23 NOTE — ED Triage Notes (Signed)
The pt arrived by [facility] from [facility]  She has pulled out her trach.  Supposedly according to the paramedics she does not really need the trach and is has been out recently

## 2016-09-23 NOTE — ED Notes (Signed)
ptar here to transport

## 2016-09-23 NOTE — ED Notes (Signed)
Report called to [facility]  ptar called to take the pt back

## 2016-09-25 ENCOUNTER — Emergency Department (HOSPITAL_COMMUNITY)
Admission: EM | Admit: 2016-09-25 | Discharge: 2016-09-25 | Disposition: A | Discharge status: Home / Self Care | Payer: Medicare Other | Source: Home / Self Care | Attending: Emergency Medicine | Admitting: Emergency Medicine

## 2016-09-25 ENCOUNTER — Encounter (HOSPITAL_COMMUNITY): Payer: Self-pay

## 2016-09-25 ENCOUNTER — Emergency Department (HOSPITAL_COMMUNITY)
Admission: EM | Admit: 2016-09-25 | Discharge: 2016-09-25 | Disposition: A | Discharge status: Home / Self Care | Payer: Medicare Other | Attending: Emergency Medicine | Admitting: Emergency Medicine

## 2016-09-25 DIAGNOSIS — J45909 Unspecified asthma, uncomplicated: Secondary | ICD-10-CM

## 2016-09-25 DIAGNOSIS — Z7982 Long term (current) use of aspirin: Secondary | ICD-10-CM | POA: Insufficient documentation

## 2016-09-25 DIAGNOSIS — E039 Hypothyroidism, unspecified: Secondary | ICD-10-CM

## 2016-09-25 DIAGNOSIS — Z794 Long term (current) use of insulin: Secondary | ICD-10-CM | POA: Insufficient documentation

## 2016-09-25 DIAGNOSIS — J9509 Other tracheostomy complication: Secondary | ICD-10-CM | POA: Insufficient documentation

## 2016-09-25 DIAGNOSIS — I2510 Atherosclerotic heart disease of native coronary artery without angina pectoris: Secondary | ICD-10-CM

## 2016-09-25 DIAGNOSIS — J9503 Malfunction of tracheostomy stoma: Secondary | ICD-10-CM

## 2016-09-25 DIAGNOSIS — I5022 Chronic systolic (congestive) heart failure: Secondary | ICD-10-CM | POA: Insufficient documentation

## 2016-09-25 DIAGNOSIS — I110 Hypertensive heart disease with heart failure: Secondary | ICD-10-CM | POA: Insufficient documentation

## 2016-09-25 DIAGNOSIS — E119 Type 2 diabetes mellitus without complications: Secondary | ICD-10-CM

## 2016-09-25 DIAGNOSIS — Z79899 Other long term (current) drug therapy: Secondary | ICD-10-CM | POA: Insufficient documentation

## 2016-09-25 DIAGNOSIS — Z8673 Personal history of transient ischemic attack (TIA), and cerebral infarction without residual deficits: Secondary | ICD-10-CM | POA: Insufficient documentation

## 2016-09-25 DIAGNOSIS — Z87891 Personal history of nicotine dependence: Secondary | ICD-10-CM | POA: Insufficient documentation

## 2016-09-25 NOTE — ED Triage Notes (Signed)
Per EMS: Pt from [facility]. Pt with displaced trach. Pt with NAD. States feels like breathing ok, pulse ox 100% RA. VSS.

## 2016-09-25 NOTE — ED Notes (Signed)
RT at bedside to replace trach.

## 2016-09-25 NOTE — Discharge Instructions (Signed)
Be careful not to pull your trach out. Follow-up with your primary care doctor. Return here for new concerns.

## 2016-09-25 NOTE — ED Notes (Signed)
PTAR called  

## 2016-09-25 NOTE — ED Provider Notes (Signed)
[place] DEPT Provider Note   CSN: [id number] Arrival date & time: 09/25/16  1828     History   Chief Complaint No chief complaint on file.   HPI Angelica Beck is a 66 y.o. female.  The history is provided by the patient and medical records.    66 y.o. F with hx of DM, HLP, HTN, hyperthyroidism, obesity, Chronic hypoventilation syndrome with trach, presenting to the ED for tracheostomy displacement.  This is patient's third visit and is many days for same.  Lurline Idol was initially placed on 07/14/16, size 6 by Dr. Redmond Baseman after being on mechanical ventilation for 2 weeks.  She has been seen multiple times in the ED for trach displacement since then.  When replaced yesterday, size 4 tube was placed.  O2 sats 100% on RA on arrival.  Patient without any noted distress.  Past Medical History:  Diagnosis Date  . Arthritis   . Asthma    09/07/16  . Diabetes mellitus, type 2 ([place])   . Hyperlipidemia   . Hypertension   . Hyperthyroidism   . Obesity hypoventilation syndrome ([place]) 06/20/2013  . Stroke [facility])     Patient Active Problem List   Diagnosis Date Noted  . Normocytic normochromic anemia 09/07/2016  . Acute on chronic respiratory failure ([place])   . (HFpEF) heart failure with preserved ejection fraction ([place])   . Enteritis due to Clostridium difficile   . SBO (small bowel obstruction) 08/10/2016  . Collapse of left lung   . S/P percutaneous endoscopic gastrostomy (PEG) tube placement ([place])   . Tracheostomy status ([place])   . HCAP (healthcare-associated pneumonia) 07/16/2016  . Encephalopathy   . Palliative care encounter   . Abdominal pain   . Pressure injury of skin 06/24/2016  . Encounter for central line placement   . Acute respiratory failure ([place]) 06/21/2016  . Septic shock ([place]) 06/20/2016  . Chronic respiratory failure with hypoxia ([place]) 11/29/2015  . Essential hypertension   . Bacterial UTI 08/06/2015  . Sepsis ([place]) 08/06/2015  . Altered mental status   .  Lactic acidosis 08/02/2015  . Acute encephalopathy 08/02/2015  . History of CVA (cerebrovascular accident) 08/02/2015  . Chronic systolic HF (heart failure) ([place]) 08/02/2015  . Cognitive impairment 08/02/2015  . AKI (acute kidney injury) ([place]) 02/08/2015  . Abnormality of gait 02/08/2015  . Hyperthyroidism 02/08/2015  . Chest pain 01/19/2014  . Obesity hypoventilation syndrome ([place]) 06/20/2013  . Hyperglycemia 06/28/2009  . HYPERLIPIDEMIA-MIXED 06/28/2009  . OVERWEIGHT/OBESITY 06/28/2009  . HYPERTENSION, BENIGN 06/28/2009  . CAD, NATIVE VESSEL 06/28/2009  . DYSPNEA 06/28/2009    Past Surgical History:  Procedure Laterality Date  . IR GENERIC HISTORICAL  07/12/2016   IR US GUIDE VASC ACCESS RIGHT 07/12/2016 [name], MD [facility]  . IR GENERIC HISTORICAL  07/12/2016   IR FLUORO GUIDE CV LINE RIGHT 07/12/2016 [name], MD [facility]  . IR GENERIC HISTORICAL  07/17/2016   IR GASTROSTOMY TUBE MOD SED 07/17/2016 [name], MD [facility]  . IR GENERIC HISTORICAL  08/23/2016   IR REMOVAL TUN CV CATH W/O FL 08/23/2016 [name], MD [facility]  . TRACHEOSTOMY TUBE PLACEMENT N/A 07/14/2016   Procedure: TRACHEOSTOMY, THYROID ISTHMUSECTOMY;  Surgeon: [name], MD;  Location: [facility];  Service: ENT;  Laterality: N/A;  . VENTRAL HERNIA REPAIR      OB History    No data available       Home Medications    Prior to Admission medications   Medication  Sig Start Date End Date Taking? Authorizing Provider  AMBULATORY NON FORMULARY MEDICATION Give 45 mL/hr by tube See admin instructions. Tube feedings from 7p-7a only Novosource @@ 33m/hr via peg and free water flushes 350mevery 3hrs.    Historical Provider, MD  amLODipine (NORVASC) 10 MG tablet Take 10 mg by mouth daily.    Historical Provider, MD  aspirin 81 MG chewable tablet Chew 81 mg by mouth daily.    Historical Provider, MD  atorvastatin (LIPITOR) 40 MG tablet Place 40 mg into feeding tube  every evening.     Historical Provider, MD  b complex-vitamin c-folic acid (NEPHRO-VITE) 0.8 MG TABS tablet Take 1 tablet by mouth every morning.    Historical Provider, MD  bisacodyl (DULCOLAX) 10 MG suppository Place 10 mg rectally as needed for moderate constipation.    Historical Provider, MD  budesonide (PULMICORT) 0.5 MG/2ML nebulizer solution Take 0.5 mg by nebulization 2 (two) times daily.    Historical Provider, MD  cholecalciferol (VITAMIN D) 1000 units tablet Take 1,000 Units by mouth daily.    Historical Provider, MD  docusate sodium (COLACE) 100 MG capsule Take 100 mg by mouth daily.    Historical Provider, MD  famotidine (PEPCID) 20 MG tablet Place 1 tablet (20 mg total) into feeding tube daily. 07/19/16   BrDonita BrooksNP  ferrous sulfate 325 (65 FE) MG tablet Take 325 mg by mouth 2 (two) times daily with a meal.    Historical Provider, MD  heparin 5000 UNIT/ML injection Inject 1 mL (5,000 Units total) into the skin every 8 (eight) hours. 07/18/16   BrDonita BrooksNP  insulin glargine (LANTUS) 100 UNIT/ML injection Inject 15 Units into the skin at bedtime.     Historical Provider, MD  ipratropium-albuterol (DUONEB) 0.5-2.5 (3) MG/3ML SOLN Take 3 mLs by nebulization every 4 (four) hours as needed (for dyspnea).    Historical Provider, MD  Melatonin 3 MG TABS Place 3 mg into feeding tube at bedtime.    Historical Provider, MD  methimazole (TAPAZOLE) 10 MG tablet Place 1 tablet (10 mg total) into feeding tube daily. 07/19/16   BrDonita BrooksNP  metoCLOPramide (REGLAN) 10 MG tablet Place 10 mg into feeding tube 3 (three) times daily.     Historical Provider, MD  metoprolol (LOPRESSOR) 50 MG tablet Take 50 mg by mouth 2 (two) times daily.    Historical Provider, MD  ondansetron (ZOFRAN) 4 MG tablet Take 4 mg by mouth every 8 (eight) hours as needed for nausea or vomiting.    Historical Provider, MD  Sodium Phosphates (RA SALINE ENEMA RE) Place 1 each rectally as needed (for  constipation).    Historical Provider, MD    Family History Family History  Problem Relation Age of Onset  . Diabetes Mother   . Cancer Neg Hx   . Heart disease Neg Hx   . Stroke Neg Hx     Social History Social History  Substance Use Topics  . Smoking status: Former Smoker    Packs/day: 0.25    Years: 30.00    Types: Cigarettes    Quit date: 09/25/2002  . Smokeless tobacco: Never Used  . Alcohol use No     Allergies   Penicillins and Lactose intolerance (gi)   Review of Systems Review of Systems  Constitutional:       Trach displaced  All other systems reviewed and are negative.    Physical Exam Updated Vital Signs BP 142/68 (BP Location: Right  Arm)   Pulse 86   Temp 98.1 F (36.7 C) (Oral)   Resp 18   Ht '5\' 6"'$  (1.676 m)   Wt 103 kg   SpO2 100%   BMI 36.64 kg/m   Physical Exam  Constitutional: She is oriented to person, place, and time. She appears well-developed and well-nourished.  HENT:  Head: Normocephalic and atraumatic.  Mouth/Throat: Oropharynx is clear and moist.  Trach tube absent, stoma with some clear secretions noted  Eyes: Conjunctivae and EOM are normal. Pupils are equal, round, and reactive to light.  Neck: Normal range of motion.  Cardiovascular: Normal rate, regular rhythm and normal heart sounds.   Pulmonary/Chest: Effort normal and breath sounds normal. No respiratory distress. She has no wheezes.  O2 sats 100% during exam, no distress  Abdominal: Soft. Bowel sounds are normal. There is no tenderness. There is no rebound.  Musculoskeletal: Normal range of motion.  Neurological: She is alert and oriented to person, place, and time.  Skin: Skin is warm and dry.  Psychiatric: She has a normal mood and affect.  Nursing note and vitals reviewed.    ED Treatments / Results  Labs (all labs ordered are listed, but only abnormal results are displayed) Labs Reviewed - No data to display  EKG  EKG Interpretation None        Radiology No results found.  Procedures Procedures (including critical care time)  Medications Ordered in ED Medications - No data to display   Initial Impression / Assessment and Plan / ED Course  I have reviewed the triage vital signs and the nursing notes.  Pertinent labs & imaging results that were available during my care of the patient were reviewed by me and considered in my medical decision making (see chart for details).  Clinical Course    66 y.o. F here with trach dislodgement.  Hx of same, seen here for the past 3 consecutive days for same.  On arrival, patient without noted complaints aside from trach.  Stoma open with clear secretions noted.  Was suctioned by EMS on arrival.  O2 sats 100% on RA, no distress noted.  Trach replaced here by Dr. Darl Householder without complications.  See his separate procedure note.  RT to suction.  Patient appears stable for discharge back to facility.  Final Clinical Impressions(s) / ED Diagnoses   Final diagnoses:  Tracheostomy malfunction [facility])    New Prescriptions New Prescriptions   No medications on file     Larene Pickett, Hershal Coria 09/25/16 [place], MD 09/25/16 2351

## 2016-09-25 NOTE — ED Triage Notes (Signed)
Transported via [facility]. Patient from [facility]. Per EMS patient's trach became dislodged today.  This is the 3rd time this has occurred in the past 2 weeks. Patient in no distress. BP 168/88, HR 90, SpO2 99%, CBG 173.

## 2016-09-25 NOTE — ED Provider Notes (Signed)
[facility] DEPT Provider Note   CSN: [id number] Arrival date & time: 09/25/16  0159   By signing my name below, I, Neta Mends, attest that this documentation has been prepared under the direction and in the presence of Deno Etienne, DO . Electronically Signed: Neta Mends, ED Scribe. 09/25/2016. 2:10 AM.   History   Chief Complaint Chief Complaint  Patient presents with  . Tracheostomy Tube Change   The history is provided by the patient. No language interpreter was used.   HPI Comments:  Angelica Beck is a 66 y.o. female who presents to the Emergency Department, here due to a displaced tracheostomy tube. Pt was here for the same 2 days ago. No alleviating factors noted. Pt denies other associated symptoms.   Past Medical History:  Diagnosis Date  . Arthritis   . Asthma    09/07/16  . Diabetes mellitus, type 2 ([place])   . Hyperlipidemia   . Hypertension   . Hyperthyroidism   . Obesity hypoventilation syndrome ([place]) 06/20/2013  . Stroke [facility])     Patient Active Problem List   Diagnosis Date Noted  . Normocytic normochromic anemia 09/07/2016  . Acute on chronic respiratory failure ([place])   . (HFpEF) heart failure with preserved ejection fraction ([place])   . Enteritis due to Clostridium difficile   . SBO (small bowel obstruction) 08/10/2016  . Collapse of left lung   . S/P percutaneous endoscopic gastrostomy (PEG) tube placement ([place])   . Tracheostomy status ([place])   . HCAP (healthcare-associated pneumonia) 07/16/2016  . Encephalopathy   . Palliative care encounter   . Abdominal pain   . Pressure injury of skin 06/24/2016  . Encounter for central line placement   . Acute respiratory failure ([place]) 06/21/2016  . Septic shock ([place]) 06/20/2016  . Chronic respiratory failure with hypoxia ([place]) 11/29/2015  . Essential hypertension   . Bacterial UTI 08/06/2015  . Sepsis ([place]) 08/06/2015  . Altered mental status   . Lactic acidosis 08/02/2015  . Acute  encephalopathy 08/02/2015  . History of CVA (cerebrovascular accident) 08/02/2015  . Chronic systolic HF (heart failure) ([place]) 08/02/2015  . Cognitive impairment 08/02/2015  . AKI (acute kidney injury) ([place]) 02/08/2015  . Abnormality of gait 02/08/2015  . Hyperthyroidism 02/08/2015  . Chest pain 01/19/2014  . Obesity hypoventilation syndrome ([place]) 06/20/2013  . Hyperglycemia 06/28/2009  . HYPERLIPIDEMIA-MIXED 06/28/2009  . OVERWEIGHT/OBESITY 06/28/2009  . HYPERTENSION, BENIGN 06/28/2009  . CAD, NATIVE VESSEL 06/28/2009  . DYSPNEA 06/28/2009    Past Surgical History:  Procedure Laterality Date  . IR GENERIC HISTORICAL  07/12/2016   IR US GUIDE VASC ACCESS RIGHT 07/12/2016 [name], MD [facility]  . IR GENERIC HISTORICAL  07/12/2016   IR FLUORO GUIDE CV LINE RIGHT 07/12/2016 [name], MD [facility]  . IR GENERIC HISTORICAL  07/17/2016   IR GASTROSTOMY TUBE MOD SED 07/17/2016 [name], MD [facility]  . IR GENERIC HISTORICAL  08/23/2016   IR REMOVAL TUN CV CATH W/O FL 08/23/2016 [name], MD [facility]  . TRACHEOSTOMY TUBE PLACEMENT N/A 07/14/2016   Procedure: TRACHEOSTOMY, THYROID ISTHMUSECTOMY;  Surgeon: [name], MD;  Location: [place];  Service: ENT;  Laterality: N/A;  . VENTRAL HERNIA REPAIR      OB History    No data available       Home Medications    Prior to Admission medications   Medication Sig Start Date End Date Taking? Authorizing Provider  AMBULATORY NON FORMULARY MEDICATION Give 15  mL/hr by tube See admin instructions. Tube feedings from 7p-7a only Novosource @@ 45m/hr via peg and free water flushes [id number] 3hrs.    Historical Provider, MD  amLODipine (NORVASC) 10 MG tablet Take 10 mg by mouth daily.    Historical Provider, MD  aspirin 81 MG chewable tablet Chew 81 mg by mouth daily.    Historical Provider, MD  atorvastatin (LIPITOR) 40 MG tablet Place 40 mg into feeding tube every evening.     Historical Provider,  MD  b complex-vitamin c-folic acid (NEPHRO-VITE) 0.8 MG TABS tablet Take 1 tablet by mouth every morning.    Historical Provider, MD  bisacodyl (DULCOLAX) 10 MG suppository Place 10 mg rectally as needed for moderate constipation.    Historical Provider, MD  budesonide (PULMICORT) 0.5 MG/2ML nebulizer solution Take 0.5 mg by nebulization 2 (two) times daily.    Historical Provider, MD  cholecalciferol (VITAMIN D) 1000 units tablet Take 1,000 Units by mouth daily.    Historical Provider, MD  docusate sodium (COLACE) 100 MG capsule Take 100 mg by mouth daily.    Historical Provider, MD  famotidine (PEPCID) 20 MG tablet Place 1 tablet (20 mg total) into feeding tube daily. 07/19/16   BrDonita BrooksNP  ferrous sulfate 325 (65 FE) MG tablet Take 325 mg by mouth 2 (two) times daily with a meal.    Historical Provider, MD  heparin 5000 UNIT/ML injection Inject 1 mL (5,000 Units total) into the skin every 8 (eight) hours. 07/18/16   BrDonita BrooksNP  insulin glargine (LANTUS) 100 UNIT/ML injection Inject 15 Units into the skin at bedtime.     Historical Provider, MD  ipratropium-albuterol (DUONEB) 0.5-2.5 (3) MG/3ML SOLN Take 3 mLs by nebulization every 4 (four) hours as needed (for dyspnea).    Historical Provider, MD  Melatonin 3 MG TABS Place 3 mg into feeding tube at bedtime.    Historical Provider, MD  methimazole (TAPAZOLE) 10 MG tablet Place 1 tablet (10 mg total) into feeding tube daily. 07/19/16   BrDonita BrooksNP  metoCLOPramide (REGLAN) 10 MG tablet Place 10 mg into feeding tube 3 (three) times daily.     Historical Provider, MD  metoprolol (LOPRESSOR) 50 MG tablet Take 50 mg by mouth 2 (two) times daily.    Historical Provider, MD  ondansetron (ZOFRAN) 4 MG tablet Take 4 mg by mouth every 8 (eight) hours as needed for nausea or vomiting.    Historical Provider, MD  Sodium Phosphates (RA SALINE ENEMA RE) Place 1 each rectally as needed (for constipation).    Historical Provider, MD     Family History Family History  Problem Relation Age of Onset  . Diabetes Mother   . Cancer Neg Hx   . Heart disease Neg Hx   . Stroke Neg Hx     Social History Social History  Substance Use Topics  . Smoking status: Former Smoker    Packs/day: 0.25    Years: 30.00    Types: Cigarettes    Quit date: 09/25/2002  . Smokeless tobacco: Never Used  . Alcohol use No     Allergies   Penicillins and Lactose intolerance (gi)   Review of Systems Review of Systems  Constitutional: Negative for chills and fever.  HENT: Negative for congestion and rhinorrhea.   Eyes: Negative for redness and visual disturbance.  Respiratory: Negative for shortness of breath and wheezing.   Cardiovascular: Negative for chest pain and palpitations.  Gastrointestinal: Negative for nausea  and vomiting.  Genitourinary: Negative for dysuria and urgency.  Musculoskeletal: Negative for arthralgias and myalgias.  Skin: Negative for pallor and wound.  Neurological: Negative for dizziness and headaches.  All other systems reviewed and are negative.    Physical Exam Updated Vital Signs There were no vitals taken for this visit.  Physical Exam  Constitutional: She is oriented to person, place, and time. She appears well-developed and well-nourished. No distress.  HENT:  Head: Normocephalic and atraumatic.  Eyes: EOM are normal. Pupils are equal, round, and reactive to light.  Neck: Normal range of motion. Neck supple.  Cardiovascular: Normal rate and regular rhythm.  Exam reveals no gallop and no friction rub.   No murmur heard. Pulmonary/Chest: Effort normal and breath sounds normal. She has no wheezes. She has no rales.  Lurline Idol is absent  Abdominal: Soft. She exhibits no distension. There is no tenderness.  Musculoskeletal: She exhibits no edema or tenderness.  Neurological: She is alert and oriented to person, place, and time.  Skin: Skin is warm and dry. She is not diaphoretic.  Psychiatric:  She has a normal mood and affect. Her behavior is normal.  Nursing note and vitals reviewed.    ED Treatments / Results  DIAGNOSTIC STUDIES:  Oxygen Saturation is 100% on RA, normal by my interpretation.    COORDINATION OF CARE:  2:10 AM Discussed treatment plan with pt at bedside and pt agreed to plan.   Labs (all labs ordered are listed, but only abnormal results are displayed) Labs Reviewed - No data to display  EKG  EKG Interpretation None       Radiology No results found.  Procedures TRACHEOSTOMY REPLACEMENT Date/Time: 09/25/2016 2:35 AM Performed by: Tyrone Nine Sydnei Ohaver Authorized by: Deno Etienne  Consent: Verbal consent obtained. Risks and benefits: risks, benefits and alternatives were discussed Consent given by: patient Patient identity confirmed: verbally with patient Indications: became dislodged and fell out Local anesthesia used: no  Anesthesia: Local anesthesia used: no  Sedation: Patient sedated: no Preparation: Patient was prepped and draped in the usual sterile fashion. Tube type: single cannula Tube cuff: cuffless Tube size: 4.0 mm Seldinger technique: Seldinger technique used (over bougie) Patient tolerance: Patient tolerated the procedure well with no immediate complications    (including critical care time)  Medications Ordered in ED Medications - No data to display   Initial Impression / Assessment and Plan / ED Course  I have reviewed the triage vital signs and the nursing notes.  Pertinent labs & imaging results that were available during my care of the patient were reviewed by me and considered in my medical decision making (see chart for details).  Clinical Course     65 yo F With a chief complaint of her trach falling out. This is replaced the bedside with the bougie. Discharge home.  2:36 AM:  I have discussed the diagnosis/risks/treatment options with the patient and believe the pt to be eligible for discharge home to follow-up  with PCP/ENT. We also discussed returning to the ED immediately if new or worsening sx occur. We discussed the sx which are most concerning (e.g., sudden worsening sob) that necessitate immediate return. Medications administered to the patient during their visit and any new prescriptions provided to the patient are listed below.  Medications given during this visit Medications - No data to display   The patient appears reasonably screen and/or stabilized for discharge and I doubt any other medical condition or other [facility] requiring further screening, evaluation, or treatment  in the ED at this time prior to discharge.    Final Clinical Impressions(s) / ED Diagnoses   Final diagnoses:  Tracheostomy malfunction [facility])    New Prescriptions New Prescriptions   No medications on file   I personally performed the services described in this documentation, which was scribed in my presence. The recorded information has been reviewed and is accurate.     Deno Etienne, DO 09/25/16 0236

## 2016-09-25 NOTE — ED Notes (Signed)
Report called to [facility]. Staff states ready for pt.

## 2016-09-28 ENCOUNTER — Ambulatory Visit (HOSPITAL_COMMUNITY): Payer: Medicare Other

## 2016-09-29 ENCOUNTER — Other Ambulatory Visit (HOSPITAL_COMMUNITY): Payer: Self-pay | Admitting: Internal Medicine

## 2016-09-29 DIAGNOSIS — R1310 Dysphagia, unspecified: Secondary | ICD-10-CM

## 2016-10-01 ENCOUNTER — Encounter (HOSPITAL_COMMUNITY): Payer: Self-pay | Admitting: Certified Nurse Midwife

## 2016-10-01 ENCOUNTER — Emergency Department (HOSPITAL_COMMUNITY)
Admission: EM | Admit: 2016-10-01 | Discharge: 2016-10-01 | Disposition: A | Discharge status: Home / Self Care | Payer: Medicare Other | Attending: Emergency Medicine | Admitting: Emergency Medicine

## 2016-10-01 ENCOUNTER — Emergency Department (HOSPITAL_COMMUNITY): Payer: Medicare Other

## 2016-10-01 DIAGNOSIS — Z794 Long term (current) use of insulin: Secondary | ICD-10-CM | POA: Insufficient documentation

## 2016-10-01 DIAGNOSIS — J9500 Unspecified tracheostomy complication: Secondary | ICD-10-CM | POA: Diagnosis not present

## 2016-10-01 DIAGNOSIS — I5022 Chronic systolic (congestive) heart failure: Secondary | ICD-10-CM | POA: Insufficient documentation

## 2016-10-01 DIAGNOSIS — J45909 Unspecified asthma, uncomplicated: Secondary | ICD-10-CM | POA: Diagnosis not present

## 2016-10-01 DIAGNOSIS — E119 Type 2 diabetes mellitus without complications: Secondary | ICD-10-CM | POA: Diagnosis not present

## 2016-10-01 DIAGNOSIS — J9620 Acute and chronic respiratory failure, unspecified whether with hypoxia or hypercapnia: Secondary | ICD-10-CM | POA: Diagnosis not present

## 2016-10-01 DIAGNOSIS — Z7982 Long term (current) use of aspirin: Secondary | ICD-10-CM | POA: Insufficient documentation

## 2016-10-01 DIAGNOSIS — J9503 Malfunction of tracheostomy stoma: Secondary | ICD-10-CM

## 2016-10-01 DIAGNOSIS — I2510 Atherosclerotic heart disease of native coronary artery without angina pectoris: Secondary | ICD-10-CM | POA: Insufficient documentation

## 2016-10-01 DIAGNOSIS — Z87891 Personal history of nicotine dependence: Secondary | ICD-10-CM | POA: Diagnosis not present

## 2016-10-01 DIAGNOSIS — Z8673 Personal history of transient ischemic attack (TIA), and cerebral infarction without residual deficits: Secondary | ICD-10-CM | POA: Insufficient documentation

## 2016-10-01 DIAGNOSIS — I110 Hypertensive heart disease with heart failure: Secondary | ICD-10-CM | POA: Insufficient documentation

## 2016-10-01 NOTE — Discharge Instructions (Signed)
It was our pleasure to provide your ER care today - we hope that you feel better.  Please be careful not to dislodge your trach.  Follow up with your doctor in the coming week.  Return to ER if worse, new symptoms, fevers, trouble breathing, other concern.

## 2016-10-01 NOTE — ED Notes (Signed)
Dr Ashok Cordia placed a Peds 4.5 uncuffed Trach in. A11 sent to materials to receive a 4.0 uncuffed trach for patient. Respiratory made aware.

## 2016-10-01 NOTE — ED Notes (Signed)
Spoke to pt's daughter and updated her on pt's condition per pt permission.  Attempted to call [facility] facility, no answer.  Will try again

## 2016-10-01 NOTE — ED Provider Notes (Signed)
[facility] DEPT Provider Note   CSN: [id number] Arrival date & time: 10/01/16  1827     History   Chief Complaint Chief Complaint  Patient presents with  . Tracheostomy Tube Change    HPI Angelica Beck is a 66 y.o. female.  Patient s/p trach placement 06/2016, multiple ED visits since with trach displacement, presents indicating trach out today.  Pt unsure how long trach out.  Occasional non prod cough. No neck/throat pain. No fever or chills. No chest pain.    The history is provided by the patient.    Past Medical History:  Diagnosis Date  . Arthritis   . Asthma    09/07/16  . Diabetes mellitus, type 2 ([place])   . Hyperlipidemia   . Hypertension   . Hyperthyroidism   . Obesity hypoventilation syndrome ([place]) 06/20/2013  . Stroke [facility])     Patient Active Problem List   Diagnosis Date Noted  . Normocytic normochromic anemia 09/07/2016  . Acute on chronic respiratory failure ([place])   . (HFpEF) heart failure with preserved ejection fraction ([place])   . Enteritis due to Clostridium difficile   . SBO (small bowel obstruction) 08/10/2016  . Collapse of left lung   . S/P percutaneous endoscopic gastrostomy (PEG) tube placement ([place])   . Tracheostomy status ([place])   . HCAP (healthcare-associated pneumonia) 07/16/2016  . Encephalopathy   . Palliative care encounter   . Abdominal pain   . Pressure injury of skin 06/24/2016  . Encounter for central line placement   . Acute respiratory failure ([place]) 06/21/2016  . Septic shock ([place]) 06/20/2016  . Chronic respiratory failure with hypoxia ([place]) 11/29/2015  . Essential hypertension   . Bacterial UTI 08/06/2015  . Sepsis ([place]) 08/06/2015  . Altered mental status   . Lactic acidosis 08/02/2015  . Acute encephalopathy 08/02/2015  . History of CVA (cerebrovascular accident) 08/02/2015  . Chronic systolic HF (heart failure) ([place]) 08/02/2015  . Cognitive impairment 08/02/2015  . AKI (acute kidney injury) ([place]) 02/08/2015  .  Abnormality of gait 02/08/2015  . Hyperthyroidism 02/08/2015  . Chest pain 01/19/2014  . Obesity hypoventilation syndrome ([place]) 06/20/2013  . Hyperglycemia 06/28/2009  . HYPERLIPIDEMIA-MIXED 06/28/2009  . OVERWEIGHT/OBESITY 06/28/2009  . HYPERTENSION, BENIGN 06/28/2009  . CAD, NATIVE VESSEL 06/28/2009  . DYSPNEA 06/28/2009    Past Surgical History:  Procedure Laterality Date  . IR GENERIC HISTORICAL  07/12/2016   IR US GUIDE VASC ACCESS RIGHT 07/12/2016 [name], MD [facility]  . IR GENERIC HISTORICAL  07/12/2016   IR FLUORO GUIDE CV LINE RIGHT 07/12/2016 [name], MD [facility]  . IR GENERIC HISTORICAL  07/17/2016   IR GASTROSTOMY TUBE MOD SED 07/17/2016 [name], MD [facility]  . IR GENERIC HISTORICAL  08/23/2016   IR REMOVAL TUN CV CATH W/O FL 08/23/2016 [name], MD [facility]  . TRACHEOSTOMY TUBE PLACEMENT N/A 07/14/2016   Procedure: TRACHEOSTOMY, THYROID ISTHMUSECTOMY;  Surgeon: [name], MD;  Location: [place];  Service: ENT;  Laterality: N/A;  . VENTRAL HERNIA REPAIR      OB History    No data available       Home Medications    Prior to Admission medications   Medication Sig Start Date End Date Taking? Authorizing Provider  AMBULATORY NON FORMULARY MEDICATION Give 45 mL/hr by tube See admin instructions. Tube feedings from 7p-7a only Novosource @@ 64m/hr via peg and free water flushes 363mevery 3hrs.    Historical Provider, MD  amLODipine (NORVASC) 10  MG tablet Take 10 mg by mouth daily.    Historical Provider, MD  aspirin 81 MG chewable tablet Chew 81 mg by mouth daily.    Historical Provider, MD  atorvastatin (LIPITOR) 40 MG tablet Place 40 mg into feeding tube every evening.     Historical Provider, MD  b complex-vitamin c-folic acid (NEPHRO-VITE) 0.8 MG TABS tablet Take 1 tablet by mouth every morning.    Historical Provider, MD  bisacodyl (DULCOLAX) 10 MG suppository Place 10 mg rectally as needed for moderate  constipation.    Historical Provider, MD  budesonide (PULMICORT) 0.5 MG/2ML nebulizer solution Take 0.5 mg by nebulization 2 (two) times daily.    Historical Provider, MD  cholecalciferol (VITAMIN D) 1000 units tablet Take 1,000 Units by mouth daily.    Historical Provider, MD  docusate sodium (COLACE) 100 MG capsule Take 100 mg by mouth daily.    Historical Provider, MD  famotidine (PEPCID) 20 MG tablet Place 1 tablet (20 mg total) into feeding tube daily. 07/19/16   [name], NP  ferrous sulfate 325 (65 FE) MG tablet Take 325 mg by mouth 2 (two) times daily with a meal.    Historical Provider, MD  heparin 5000 UNIT/ML injection Inject 1 mL (5,000 Units total) into the skin every 8 (eight) hours. 07/18/16   [name], NP  insulin glargine (LANTUS) 100 UNIT/ML injection Inject 15 Units into the skin at bedtime.     Historical Provider, MD  ipratropium-albuterol (DUONEB) 0.5-2.5 (3) MG/3ML SOLN Take 3 mLs by nebulization every 4 (four) hours as needed (for dyspnea).    Historical Provider, MD  Melatonin 3 MG TABS Place 3 mg into feeding tube at bedtime.    Historical Provider, MD  methimazole (TAPAZOLE) 10 MG tablet Place 1 tablet (10 mg total) into feeding tube daily. 07/19/16   [name], NP  metoCLOPramide (REGLAN) 10 MG tablet Place 10 mg into feeding tube 3 (three) times daily.     Historical Provider, MD  metoprolol (LOPRESSOR) 50 MG tablet Take 50 mg by mouth 2 (two) times daily.    Historical Provider, MD  ondansetron (ZOFRAN) 4 MG tablet Take 4 mg by mouth every 8 (eight) hours as needed for nausea or vomiting.    Historical Provider, MD  Sodium Phosphates (RA SALINE ENEMA RE) Place 1 each rectally as needed (for constipation).    Historical Provider, MD    Family History Family History  Problem Relation Age of Onset  . Diabetes Mother   . Cancer Neg Hx   . Heart disease Neg Hx   . Stroke Neg Hx     Social History Social History  Substance Use Topics  . Smoking  status: Former Smoker    Packs/day: 0.25    Years: 30.00    Types: Cigarettes    Quit date: 09/25/2002  . Smokeless tobacco: Never Used  . Alcohol use No     Allergies   Penicillins and Lactose intolerance (gi)   Review of Systems Review of Systems  Constitutional: Negative for chills and fever.  HENT: Negative for sore throat.   Respiratory: Positive for cough.   Cardiovascular: Negative for chest pain.  Musculoskeletal: Negative for neck pain.     Physical Exam Updated Vital Signs BP 121/74 (BP Location: Right Arm)   Pulse 82   Resp 20   Ht '5\' 6"'$  (1.676 m)   Wt 103 kg   SpO2 100%   BMI 36.64 kg/m   Physical  Exam  Constitutional: She appears well-developed and well-nourished. No distress.  HENT:  Mouth/Throat: Oropharynx is clear and moist.  Eyes: Conjunctivae are normal. No scleral icterus.  Neck: Neck supple. No tracheal deviation present.  Trach absent. Stoma appears mostly closed. No infection at site.   Cardiovascular: Normal rate, regular rhythm, normal heart sounds and intact distal pulses.   Pulmonary/Chest: Effort normal and breath sounds normal. No respiratory distress.  Abdominal: Normal appearance. She exhibits no distension. There is no tenderness.  Musculoskeletal: She exhibits no edema.  Neurological: She is alert.  Skin: Skin is warm and dry. No rash noted. She is not diaphoretic.  Psychiatric: She has a normal mood and affect.  Nursing note and vitals reviewed.    ED Treatments / Results  Labs (all labs ordered are listed, but only abnormal results are displayed) Labs Reviewed - No data to display  EKG  EKG Interpretation None       Radiology Dg Chest [facility] 1 View  Result Date: 10/01/2016 CLINICAL DATA:  Pt arrives via [facility] for a tracheostomy tube replacement. Pt states she woke up and it was out. Facility does not have the type that she needs. EXAM: PORTABLE CHEST 1 VIEW COMPARISON:  09/11/2016 FINDINGS: Cardiac silhouette is  top-normal in size. No mediastinal or hilar masses. No convincing adenopathy. Lungs are clear.  No pleural effusion.  No pneumothorax. Tracheostomy tube projects with its tip over the upper thoracic trachea. Skeletal structures are grossly intact. IMPRESSION: No acute cardiopulmonary disease. Electronically Signed   By: Lajean Manes M.D.   On: 10/01/2016 19:30    Procedures Procedures (including critical care time)  Medications Ordered in ED Medications - No data to display   Initial Impression / Assessment and Plan / ED Course  I have reviewed the triage vital signs and the nursing notes.  Pertinent labs & imaging results that were available during my care of the patient were reviewed by me and considered in my medical decision making (see chart for details).  Clinical Course     Prior notes indicate 4 cuffless trach.   Reviewed nursing notes and prior charts for additional history.   4 trach placed by resp therapy, co2 color change, bil bs, and cxr confirm placement. Recheck, breathing comfortably. No distress.  Patient currently appears stable for d/c.    Final Clinical Impressions(s) / ED Diagnoses   Final diagnoses:  None    New Prescriptions New Prescriptions   No medications on file     Lajean Saver, MD 10/01/16 1935

## 2016-10-01 NOTE — ED Notes (Signed)
Respiratory, MD and RN bedside to insert trach., X-ray called to come back to complete X-ray.

## 2016-10-01 NOTE — Procedures (Signed)
Tracheostomy Change Note  Patient Details:   Name: Angelica Beck DOB: November 08, 1950 MRN: [id number]    Airway Documentation:  Placed a new 4 shiley cuffless trach in patient with little issues. Some slight resistance upon insertion, but eased and trach placed no problems. Assisted by Cecilio Asper RRT,RCP . Positive EtCO2 and BBS heard. Xray pending. No bleeding at this time and suctioned patient for small amount of thick yellow.   Evaluation  O2 sats: stable throughout Complications: No apparent complications Patient did tolerate procedure well. Bilateral Breath Sounds: Coarse crackles    Evonnie Dawes 10/01/2016, 7:21 PM

## 2016-10-01 NOTE — ED Triage Notes (Signed)
Pt arrives via [facility] for a tracheostomy tube replacement. Pt states she woke up and it was out. Facility does not have the type that she needs.

## 2016-10-03 ENCOUNTER — Ambulatory Visit (HOSPITAL_COMMUNITY)
Admission: RE | Admit: 2016-10-03 | Discharge: 2016-10-03 | Disposition: A | Discharge status: Home / Self Care | Payer: PRIVATE HEALTH INSURANCE | Source: Ambulatory Visit | Attending: Internal Medicine | Admitting: Internal Medicine

## 2016-10-03 ENCOUNTER — Encounter: Payer: Self-pay | Admitting: Internal Medicine

## 2016-10-03 DIAGNOSIS — R1310 Dysphagia, unspecified: Secondary | ICD-10-CM | POA: Insufficient documentation

## 2016-10-03 DIAGNOSIS — I639 Cerebral infarction, unspecified: Secondary | ICD-10-CM | POA: Diagnosis not present

## 2016-10-03 DIAGNOSIS — Z9189 Other specified personal risk factors, not elsewhere classified: Secondary | ICD-10-CM | POA: Insufficient documentation

## 2016-10-03 NOTE — Progress Notes (Signed)
Modified Barium Swallow Progress Note  Patient Details  Name: Angelica Beck MRN: [id number] Date of Birth: 05-24-1951  Today's Date: 10/03/2016  Modified Barium Swallow completed.  Full report located under Chart Review in the Imaging Section.  Brief recommendations include the following:  Clinical Impression  Pt has a mild oropharyngeal dysphagia with no aspiration across trials. She has prolonged bolus transit and decreased bolus cohesion. Solids spilled into the pharynx while she was still masticating until ultimately most of the bolus was in her valleculae, and she needed a bolus of puree to clear the tablet from her tongue. Although her pharyngeal swallow is delayed and thin liquids reach the pyriform sinuses, she maintains great airway protection. Solids and liquids were tested with PMV off as well, with no change in airway protection during the swallow. Recommend Dys 2 diet and thin liquids. Although pt could take some POs without her PMV in place, would recommend wearing it during waking hours and PO intake as tolerated to facilitate cough strength, communication, and use of upper airway.   Swallow Evaluation Recommendations       SLP Diet Recommendations: Dysphagia 2 (Fine chop) solids;Thin liquid   Liquid Administration via: Cup;Straw   Medication Administration: Whole meds with puree   Supervision: Full supervision/cueing for compensatory strategies;Staff to assist with self feeding   Compensations: Slow rate;Small sips/bites   Postural Changes: Remain semi-upright after after feeds/meals (Comment);Seated upright at 90 degrees   Oral Care Recommendations: Oral care BID        Germain Osgood 10/03/2016,2:09 PM   Germain Osgood, M.A. CCC-SLP [phone]

## 2016-10-04 ENCOUNTER — Ambulatory Visit (HOSPITAL_COMMUNITY)
Admission: RE | Admit: 2016-10-04 | Discharge: 2016-10-04 | Disposition: A | Discharge status: Home / Self Care | Payer: Medicare Other | Source: Ambulatory Visit | Attending: Internal Medicine | Admitting: Internal Medicine

## 2016-10-04 DIAGNOSIS — Z930 Tracheostomy status: Secondary | ICD-10-CM | POA: Insufficient documentation

## 2016-10-04 NOTE — Progress Notes (Signed)
   Subjective:  Possible decannulation   Patient ID: Angelica Beck, female    DOB: 12/25/1950, 66 y.o.   MRN: [id number]  HPI This is a 66 year old female who was on our service back in September 17. At that point she had a prolonged hospital stay for septic shock, c/b prolonged encephalopathy, AKI and prolonged vent dependence. This was superimposed on prior stroke. She was eventually d/c'd to [facility], subsequently discharged to SNF and then readmitted in Nov 17 for SBO and C-diff. Once again discharged to [facility] and then SNF. She has been in rehab since mid Dec 17. Has reported to ER 5 times for trach dislodgement. Actually pt taking it out. She has now been sent back to the trach clinic for trach care follow up but also with the question of if the trach is actually necessary.    Review of Systems  Constitutional: Negative.   HENT: Negative.   Eyes: Negative.   Respiratory: Negative.   Cardiovascular: Negative.   Gastrointestinal: Negative.   Endocrine: Negative.   Genitourinary: Negative.   Musculoskeletal: Negative.   Allergic/Immunologic: Negative.   Neurological: Negative.   Hematological: Negative.    Vital signs:, pulse 78, respirations 18 and pulse oximetry 100 %  Objective:   Physical Exam  Constitutional: She appears well-developed and well-nourished.  HENT:  Head: Normocephalic and atraumatic.  Mouth/Throat: Oropharynx is clear and moist.  Eyes: Conjunctivae and EOM are normal. Pupils are equal, round, and reactive to light. No scleral icterus.  Neck: Normal range of motion. Tracheal deviation present.  Trach unremarkable Excellent phonation w/ trach occluded.    Cardiovascular: Normal rate, regular rhythm and normal heart sounds.   Pulmonary/Chest: Effort normal and breath sounds normal. No respiratory distress. She has no wheezes. She has no rales.  Abdominal: Soft. Bowel sounds are normal. She exhibits no distension.  Musculoskeletal: Normal range of motion. She  exhibits no edema or deformity.  Neurological: She is alert.  Skin: Skin is warm and dry. No erythema.  Psychiatric: She has a normal mood and affect. Her behavior is normal. Thought content normal.    Trach Brand: Shiley Size: 4.0 Style: Uncuffed Secured by: Velcro     Assessment & Plan:  Resolved tracheostomy status.  Now s/p decannulation.   Plan Resume current diet Occlusive dressing x 24 hrs then change q24 hrs  F/u PRN   Erick Colace ACNP-BC [place] Pager # [phone] OR # [phone] if no answer

## 2016-10-04 NOTE — Progress Notes (Signed)
Tracheostomy Procedure Note  Angelica Beck [id number] [birth year]  Pre Procedure Tracheostomy Information  Trach Brand: Shiley Size: 4.0 Style: Uncuffed Secured by: Velcro   Procedure: decannulation    Post Procedure Tracheostomy Information  None  Post Procedure Evaluation:   Vital signs:, pulse 78, respirations 18 and pulse oximetry 100 % Patients current condition: stable Complications: No apparent complications Trach site exam: clean, dry Wound care done: dry Patient did tolerate procedure well.   Education: none  Prescription needs: none    Additional needs: none

## 2016-10-06 ENCOUNTER — Non-Acute Institutional Stay (SKILLED_NURSING_FACILITY): Payer: Medicare Other | Admitting: Nurse Practitioner

## 2016-10-06 DIAGNOSIS — E1149 Type 2 diabetes mellitus with other diabetic neurological complication: Secondary | ICD-10-CM | POA: Diagnosis not present

## 2016-10-06 DIAGNOSIS — R4189 Other symptoms and signs involving cognitive functions and awareness: Secondary | ICD-10-CM

## 2016-10-06 DIAGNOSIS — Z794 Long term (current) use of insulin: Secondary | ICD-10-CM

## 2016-10-06 DIAGNOSIS — D649 Anemia, unspecified: Secondary | ICD-10-CM

## 2016-10-06 DIAGNOSIS — E0590 Thyrotoxicosis, unspecified without thyrotoxic crisis or storm: Secondary | ICD-10-CM | POA: Diagnosis not present

## 2016-10-06 DIAGNOSIS — Z8673 Personal history of transient ischemic attack (TIA), and cerebral infarction without residual deficits: Secondary | ICD-10-CM | POA: Diagnosis not present

## 2016-10-06 NOTE — Progress Notes (Signed)
Nursing Home Location: [facility] Room: 220  Place of Service: SNF (31)  PCP: Delia Chimes, NP  Allergies  Allergen Reactions  . Penicillins Swelling    FACIAL SWELLING Has patient had a PCN reaction causing immediate rash, facial/tongue/throat swelling, SOB or lightheadedness with hypotension: Yes Has patient had a PCN reaction causing severe rash involving mucus membranes or skin necrosis: No Has patient had a PCN reaction that required hospitalization: No Has patient had a PCN reaction occurring within the last 10 years: No If all of the above answers are "NO", then may proceed with Cephalosporin use.   . Lactose Intolerance (Gi) Diarrhea and Nausea And Vomiting    Chief Complaint  Patient presents with  . Medical Management of Chronic Issues    Routine Visit  . Other    Nursing concerns over anxiety    HPI:  Patient is a 66 y.o. female seen today at [facility] for routine follow up on chronic conditions. Pt with hx of Includes obesity hypoventilation syndrome, history of hyperthyroidism, hypertension, dyslipidemia, diabetes, and arthritis. Pt at [facility] after hospitalization due to SBO complicated by pneumonia and c diff. [facility] and transferred to SNF. Pt has had acute on chronic respiratory failure for which she had a tracheostomy 07/14/16 with thyroid isthmusectomy. The patient had not tolerated capping the trach however after multiple displacements of the trach she was finally decanulated on 10/04/16. Staff reports increase anxiety on the evening of trach removal. Ativan 1 time order was given and pt tolerated well. Pt without anxiety today.   Pt scored 3/25 on screening BIMS which indicates severe cognitive deficit per Education officer, museum. Minimal communication. States her name but does not know the year or place. Nursing reports she can be combative with care. pt with hx of stroke, unsure what  baseline.   Review of Systems:  Review of Systems  Unable to perform ROS: Dementia    Past Medical History:  Diagnosis Date  . Arthritis   . Asthma    09/07/16  . Diabetes mellitus, type 2 ([place])   . Hyperlipidemia   . Hypertension   . Hyperthyroidism   . Obesity hypoventilation syndrome ([place]) 06/20/2013  . Stroke [facility])    Past Surgical History:  Procedure Laterality Date  . IR GENERIC HISTORICAL  07/12/2016   IR US GUIDE VASC ACCESS RIGHT 07/12/2016 [name], MD [facility]-INTER[facility]  . IR GENERIC HISTORICAL  07/12/2016   IR FLUORO GUIDE CV LINE RIGHT 07/12/2016 [name], MD [facility]  . IR GENERIC HISTORICAL  07/17/2016   IR GASTROSTOMY TUBE MOD SED 07/17/2016 [name], MD [facility]  . IR GENERIC HISTORICAL  08/23/2016   IR REMOVAL TUN CV CATH W/O FL 08/23/2016 [name], MD [facility]  . TRACHEOSTOMY TUBE PLACEMENT N/A 07/14/2016   Procedure: TRACHEOSTOMY, THYROID ISTHMUSECTOMY;  Surgeon: [name], MD;  Location: [place];  Service: ENT;  Laterality: N/A;  . VENTRAL HERNIA REPAIR     Social History:   reports that she quit smoking about 14 years ago. Her smoking use included Cigarettes. She has a 7.50 pack-year smoking history. She has never used smokeless tobacco. She reports that she does not drink alcohol or use drugs.  Family History  Problem Relation Age of Onset  . Diabetes Mother   . Cancer Neg Hx   . Heart disease Neg Hx   . Stroke Neg Hx     Medications: Patient's Medications  New Prescriptions   No medications on file  Previous Medications   AMBULATORY NON FORMULARY MEDICATION    Give 45 mL/hr by tube See admin instructions. Tube feedings from 7p-7a only Novosource @@ 62m/hr via peg and free water flushes 319mevery 3hrs.   AMLODIPINE (NORVASC) 10 MG TABLET    Take 10 mg by mouth daily.   ASPIRIN 81 MG CHEWABLE TABLET    Chew 81 mg by mouth daily.   ATORVASTATIN (LIPITOR) 40 MG TABLET    Place 40 mg into feeding tube every  evening.    B COMPLEX-VITAMIN C-FOLIC ACID (NEPHRO-VITE) 0.8 MG TABS TABLET    Take 1 tablet by mouth every morning.   BISACODYL (DULCOLAX) 10 MG SUPPOSITORY    Place 10 mg rectally as needed for moderate constipation.   BUDESONIDE (PULMICORT) 0.5 MG/2ML NEBULIZER SOLUTION    Take 0.5 mg by nebulization 2 (two) times daily.   CHOLECALCIFEROL (VITAMIN D) 1000 UNITS TABLET    Take 1,000 Units by mouth daily.   DOCUSATE SODIUM (COLACE) 100 MG CAPSULE    Take 100 mg by mouth daily.   FAMOTIDINE (PEPCID) 20 MG TABLET    Place 1 tablet (20 mg total) into feeding tube daily.   FERROUS SULFATE 325 (65 FE) MG TABLET    Take 325 mg by mouth 2 (two) times daily with a meal.   HEPARIN 5000 UNIT/ML INJECTION    Inject 1 mL (5,000 Units total) into the skin every 8 (eight) hours.   INSULIN GLARGINE (LANTUS) 100 UNIT/ML INJECTION    Inject 20 Units into the skin at bedtime.    IPRATROPIUM-ALBUTEROL (DUONEB) 0.5-2.5 (3) MG/3ML SOLN    Take 3 mLs by nebulization every 4 (four) hours as needed (for dyspnea).   MELATONIN 3 MG TABS    Place 3 mg into feeding tube at bedtime.   METHIMAZOLE (TAPAZOLE) 10 MG TABLET    Place 1 tablet (10 mg total) into feeding tube daily.   METOCLOPRAMIDE (REGLAN) 10 MG TABLET    Place 10 mg into feeding tube 3 (three) times daily.    METOPROLOL (LOPRESSOR) 50 MG TABLET    Take 50 mg by mouth 2 (two) times daily.   ONDANSETRON (ZOFRAN) 4 MG TABLET    Take 4 mg by mouth every 8 (eight) hours as needed for nausea or vomiting.   SACCHAROMYCES BOULARDII (FLORASTOR) 250 MG CAPSULE    Take 250 mg by mouth daily as needed. For loose stool   SODIUM PHOSPHATES (RA SALINE ENEMA RE)    Place 1 each rectally as needed (for constipation).  Modified Medications   No medications on file  Discontinued Medications   No medications on file     Physical Exam: Vitals:   10/06/16 1319  BP: 126/70  Pulse: 70  Resp: 20  Temp: 98.2 F (36.8 C)  SpO2: 98%  Weight: 227 lb (103 kg)  Height: '5\' 6"'$   (1.676 m)    Physical Exam  Constitutional: She appears well-developed and well-nourished. No distress.  HENT:  Head: Normocephalic and atraumatic.  Mouth/Throat: Oropharynx is clear and moist. No oropharyngeal exudate.  Dressing from trach removal.   Eyes: Conjunctivae are normal. Pupils are equal, round, and reactive to light.  Neck: Normal range of motion. Neck supple.  Cardiovascular: Normal rate, regular rhythm and normal heart sounds.   Pulmonary/Chest: Effort normal and breath sounds normal.  Abdominal: Soft. Bowel sounds are normal.  Musculoskeletal: She exhibits no edema or tenderness.  Neurological: She is  alert.  Skin: Skin is warm and dry. She is not diaphoretic.    Labs reviewed: Basic Metabolic Panel:  Recent Labs  08/23/16 0410  08/28/16 0626 08/30/16 0700 08/31/16 0628 09/04/16 0719 09/10/16 0419  NA 138  < > 133* 133* 135 133* 134*  K 4.3  < > 5.2* 5.3* 4.5 4.5 4.6  CL 104  < > 95* 97* 98* 98* 102  CO2 27  < > '30 30 28 25 '$ 21*  GLUCOSE 150*  < > 174* 154* 202* 153* 166*  BUN 11  < > '11 18 19 '$ 28* 21*  CREATININE 0.95  < > 1.02* 1.34* 1.41* 1.51* 1.41*  CALCIUM 8.8*  < > 9.0 9.3 9.2 9.4 10.4*  MG 1.9  --  1.3* 2.1  --   --   --   PHOS  --   --  4.2 5.1* 5.3*  --   --   < > = values in this interval not displayed.  CrCl cannot be calculated (Patient's most recent lab result is older than the maximum 21 days allowed.).  Liver Function Tests:  Recent Labs  07/19/16 0633  08/09/16 2026  08/19/16 0628  08/28/16 0626 08/30/16 0700 08/31/16 0628  AST 23  --  22  --  17  --   --   --   --   ALT 23  --  11*  --  9*  --   --   --   --   ALKPHOS 135*  --  93  --  63  --   --   --   --   BILITOT 0.9  --  0.7  --  0.3  --   --   --   --   PROT 6.0*  --  5.8*  --  4.7*  --   --   --   --   ALBUMIN 1.5*  < > 1.7*  < > 1.3*  < > 1.8* 2.1* 2.1*  < > = values in this interval not displayed. No results for input(s): LIPASE, AMYLASE in the last 8760  hours.  Recent Labs  06/22/16 0334 06/23/16 0332 06/29/16 1130  AMMONIA 72* 60* 21   CBC:  Recent Labs  08/22/16 0653 08/28/16 0626 08/30/16 0700 09/04/16 0719 09/10/16 0419  WBC 10.9* 7.4 7.3 7.4 12.0*  NEUTROABS 8.2* 4.4  --   --  8.0*  HGB 7.4* 7.6* 7.8* 8.0* 9.7*  HCT 23.2* 23.5* 24.5* 25.2* 29.9*  MCV 89.2 87.4 88.4 88.7 87.9  PLT 265 298 303 317 327   TSH:  Recent Labs  07/26/16 0620 08/05/16 0628 08/22/16 0653  TSH 0.998 0.641 2.277   A1C: Lab Results  Component Value Date   HGBA1C 5.4 08/12/2016   Lipid Panel:  Recent Labs  08/09/16 2026  TRIG 69     Assessment/Plan 1. Hyperthyroidism TSH stable in Nov, will cont current regimen.   2. Cognitive impairment Severe cognitive impairment noted on BIMS. Pt unsure of place or time but oriented to self. With some agitation and behaviors during care but does not appear anxious at this time.  Will start namenda XR titration.   3. History of CVA (cerebrovascular accident) Memory loss noted, conts on ASA 81 mg daily with heparin every 8 hours.   4. Normocytic normochromic anemia hgb stable on recent labs, will cont iron supplement   5. Type 2 diabetes mellitus with other neurologic complication, with long-term current use of insulin (HCC) Blood  sugars anywhere from 80-317, conts on lantus 20 units sq daily    Marc Sivertsen K. Harle Battiest  [facility] [phone] 8 am - 5 pm) [phone] (after hours)

## 2016-10-25 DIAGNOSIS — N3020 Other chronic cystitis without hematuria: Secondary | ICD-10-CM | POA: Diagnosis not present

## 2016-10-26 ENCOUNTER — Encounter: Payer: Self-pay | Admitting: Internal Medicine

## 2016-10-26 ENCOUNTER — Non-Acute Institutional Stay (SKILLED_NURSING_FACILITY): Payer: Medicare Other | Admitting: Internal Medicine

## 2016-10-26 DIAGNOSIS — K56609 Unspecified intestinal obstruction, unspecified as to partial versus complete obstruction: Secondary | ICD-10-CM

## 2016-10-26 DIAGNOSIS — M170 Bilateral primary osteoarthritis of knee: Secondary | ICD-10-CM | POA: Diagnosis not present

## 2016-10-26 NOTE — Assessment & Plan Note (Signed)
Swallowing study and barium swallow did not reveal significant aspiration. Dysphagia 2 diet recommended. If weight is maintained OFF nocturnal feedings, PEG tube will be discontinued by Interventional Radiology.

## 2016-10-26 NOTE — Progress Notes (Signed)
This is a nursing facility follow up for specific acute issue of knee pain and G-tube assessment.  Interim medical record and care since last [place] visit was updated with review of diagnostic studies and change in clinical status since last visit were documented.  HPI:PT/OT states the patient appears to have pain in the knee when in physical therapy. Patient herself voices no complaint. Her daughter has requested that the G-tube be removed. Dietitian apparently wants the bedtime feedings stopped as apparently weight is stable and PEG removal if weight remains stable off the nocturnal feedings. No aspiration has been reported.  She had a swallowing study 10/03/16. She had mild oropharyngeal dysphagia and mild pharyngeal dysphagia. There was no aspiration across trials. Barium swallow showed only mild premature spillage with thin liquids with intermittent flash laryngeal penetration. There was no problem with nectar thick liquid, pure, cracker, or barium tablet. Dysphagia 2 (fine chopped solids) was recommended. Also to administer liquids through cup or straw. The gastrostomy tube was inserted 07/17/16 by Interventional Radiology because of ventilator dependent respiratory failure. She has history of small bowel obstruction and C. difficile enteritis. Stools are reported as being loose.  Her most recent labs were 09/10/16. She had mild hyponatremia with a value of 134, creatinine 1.41, calcium 10.4 ,GFR 45, hemoglobin 9.7, hematocrit 29.9. She has had significant hyperglycemia (153-202), but the last A1c on record was 5.4% on 08/12/16. The family has been non-compliant with recommendations as to hyperglycemic foods. For instance at bedside is 24 ounce Coke which has 77 g of HFCS sugar. This would be 19.25 teaspoons of sugar.   Review of systems: Dementia invalidated responses.Oriented to person only. All responses were "no" . Specifically she had no gastrointestinal or musculo skeletal  complaints.  Constitutional: No fever,significant weight change, fatigue  Cardiovascular: No chest pain, palpitations,paroxysmal nocturnal dyspnea, claudication, edema  Respiratory: No cough, sputum production,hemoptysis, DOE , significant snoring,apnea  Gastrointestinal: No heartburn,dysphagia,abdominal pain, nausea / vomiting,rectal bleeding, melena,change in bowels Genitourinary: No dysuria,hematuria, pyuria,  incontinence, nocturia Musculoskeletal: No joint stiffness, joint swelling, weakness,pain Dermatologic: No rash, pruritus, change in appearance of skin Neurologic: No dizziness,headache,syncope, seizures, numbness , tingling Endocrine: No change in hair/skin/ nails, excessive thirst, excessive hunger, excessive urination  Hematologic/lymphatic: No significant bruising, lymphadenopathy,abnormal bleeding Allergy/immunology: No itchy/ watery eyes, significant sneezing, urticaria, angioedema  Physical exam:  Pertinent or positive findings: she exhibits intermittent mastication type movements of the jaws. Chin hirsutism is present. She is completely edentulous. 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 There is increased tension in the sternocleidomastoid muscles, particularly on the left. She exhibits some intermittent rhonchorous, nonproductive cough. This appears to be related to secretions in the upper airway. Breath sounds are overall decreased. She does have expiratory wheezing which is low-grade anteriorly. She has 1-2 plus edema at the sock line. She has weakness which is profound in both lower extremities. She expressed pain with range of motion/extension of the knees. Crepitus present greater on the left. Effusion bilaterally, greater on the right.   General appearance:Adequately nourished; no acute distress , increased work of breathing is present.   Lymphatic: No lymphadenopathy about the head, neck, axilla . Eyes: No conjunctival inflammation or lid edema is  present. There is no scleral icterus. Ears:  External ear exam shows no significant lesions or deformities.   Nose:  External nasal examination shows no deformity or inflammation. Nasal mucosa are pink and moist without lesions ,exudates Oral exam: lips and gums are  healthy appearing.There is no oropharyngeal erythema or exudate . Neck:  No tenderness noted.    Heart:  Normal rate and regular rhythm. S1 and S2 normal without gallop, murmur, click, rub .  Abdomen:Bowel sounds are normal. Abdomen is soft and nontender with no organomegaly, hernias,masses. GU: deferred  Extremities:  No cyanosis, clubbing Skin: Warm & dry w/o tenting. 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 #2 good oral intake as to volume, unfortunately diet choices of family will only exacerbate her hyperglycemia. If weight is maintained without the nocturnal feedings, PEG tube will be discontinued by Interventional Radiology.  #3 severe dementia

## 2016-10-26 NOTE — Assessment & Plan Note (Signed)
Aspercreme topically twice a day and arthritis strength Tylenol every 6-8 hours as needed

## 2016-10-26 NOTE — Patient Instructions (Signed)
See assessment and plan under each diagnosis in the problem list and acutely for this visit 

## 2016-11-02 ENCOUNTER — Encounter: Payer: Self-pay | Admitting: Internal Medicine

## 2016-11-02 ENCOUNTER — Non-Acute Institutional Stay (SKILLED_NURSING_FACILITY): Payer: Medicare Other | Admitting: Internal Medicine

## 2016-11-02 DIAGNOSIS — R600 Localized edema: Secondary | ICD-10-CM

## 2016-11-02 DIAGNOSIS — I5189 Other ill-defined heart diseases: Secondary | ICD-10-CM | POA: Insufficient documentation

## 2016-11-02 DIAGNOSIS — R609 Edema, unspecified: Secondary | ICD-10-CM | POA: Insufficient documentation

## 2016-11-02 DIAGNOSIS — I519 Heart disease, unspecified: Secondary | ICD-10-CM | POA: Diagnosis not present

## 2016-11-02 NOTE — Assessment & Plan Note (Signed)
Instruct family in sodium restriction. They have been noncompliant with nutritional recommendations. BMET Furosemide daily.

## 2016-11-02 NOTE — Progress Notes (Signed)
   [facility] Room: 220  PCP [place], [place] [address]   This is a nursing facility follow up for specific acute issue of Edema.  Interim medical record and care since last [place] visit was updated with review of diagnostic studies and change in clinical status since last visit were documented.  HPI: Staff reports persistent edema. She is on no diuretic. She has a history of hypertension, dyslipidemia, diabetes, and stroke. There is history of chronic systolic heart failure. Echocardiogram 06/21/16 revealed normal left ventricular size with ejection fraction 60-65 percent w/o regional wall abnormalities. Grade1 diastolic dysfunction was suggested. She does have obesity/hypoventilation syndrome. The echo revealed normal right ventricle and atrial size and function. Most recent labs were 09/10/16. Creatinine was 1.41. GFR was 45. She is on Tapazole for hyperthyroidism. TSH was 2.28 on 08/22/16. She has had hyperglycemia, but A1c was 5.4% on 08/12/16. RDW has been mildly elevated. Most recent value was 16.1. Otherwise anemia has been normochromic, normocytic. On 12/17  hemoglobin was 9.7 ,hematocrit 29.9.  Review of systems: Dementia invalidated responses. She was unable to give the date. She named the president as Maudie Flakes.  Physical exam:  Pertinent or positive findings: She was somewhat lethargic today. She exhibits repetitive chewing activity of her mouth. She's edentulous. There is hirsutism of the chin. Heart is regular, heart sounds are slightly distant. Chest is clear to inspiration but exhibits low-grade expiratory rhonchi. There is no neck vein distention or hepatojugular reflux. She has 1+ pitting edema. Posterior tibial pulses are slightly decreased.  General appearance:Adequately nourished; no acute distress , increased work of breathing is present.   Lymphatic: No lymphadenopathy about the head, neck,  axilla . Eyes: No conjunctival inflammation or lid edema is present. There is no scleral icterus. Ears:  External ear exam shows no significant lesions or deformities.   Nose:  External nasal examination shows no deformity or inflammation. Nasal mucosa are pink and moist without lesions ,exudates Oral exam: lips and gums are healthy appearing. Neck:  No thyromegaly, masses, tenderness noted.    Heart:  No gallop, murmur, click, rub .  Lungs:Chest  without wheezes, rales , rubs. Abdomen:Bowel sounds are normal. Abdomen is soft and nontender with no organomegaly, hernias,masses.PEG present GU: deferred  Extremities:  No cyanosis, clubbing  Neurologic exam : Skin: Warm & dry w/o tenting. No significant lesions or rash.    See summary under each active problem in the Problem List with associated updated therapeutic plan

## 2016-11-02 NOTE — Assessment & Plan Note (Signed)
Furosemide 40 mg daily Encourage family not to bring in high sodium snacks

## 2016-11-02 NOTE — Patient Instructions (Signed)
See assessment and plan under each diagnosis in the problem list and acutely for this visit 

## 2016-11-03 LAB — BASIC METABOLIC PANEL
BUN: 19 mg/dL (ref 4–21)
CREATININE: 1.3 mg/dL — AB (ref 0.5–1.1)
GLUCOSE: 79 mg/dL
POTASSIUM: 4.5 mmol/L (ref 3.4–5.3)
Sodium: 136 mmol/L — AB (ref 137–147)

## 2016-11-03 LAB — CBC AND DIFFERENTIAL
HCT: 27 % — AB (ref 36–46)
Hemoglobin: 8.5 g/dL — AB (ref 12.0–16.0)
PLATELETS: 242 10*3/uL (ref 150–399)
WBC: 6.8 10*3/mL

## 2016-11-03 LAB — TSH: TSH: 2.46 u[IU]/mL (ref 0.41–5.90)

## 2016-11-07 ENCOUNTER — Other Ambulatory Visit (HOSPITAL_COMMUNITY): Payer: Self-pay | Admitting: Internal Medicine

## 2016-11-07 ENCOUNTER — Non-Acute Institutional Stay (SKILLED_NURSING_FACILITY): Payer: Medicare Other | Admitting: Nurse Practitioner

## 2016-11-07 ENCOUNTER — Encounter: Payer: Self-pay | Admitting: Nurse Practitioner

## 2016-11-07 DIAGNOSIS — Z930 Tracheostomy status: Secondary | ICD-10-CM

## 2016-11-07 DIAGNOSIS — D649 Anemia, unspecified: Secondary | ICD-10-CM | POA: Diagnosis not present

## 2016-11-07 NOTE — Progress Notes (Signed)
Nursing Home Location: [facility] Room: 220  Place of Service: SNF (31)  PCP: Delia Chimes, NP   Code Status: Full Code  Allergies  Allergen Reactions  . Penicillins Swelling    FACIAL SWELLING Has patient had a PCN reaction causing immediate rash, facial/tongue/throat swelling, SOB or lightheadedness with hypotension: Yes Has patient had a PCN reaction causing severe rash involving mucus membranes or skin necrosis: No Has patient had a PCN reaction that required hospitalization: No Has patient had a PCN reaction occurring within the last 10 years: No If all of the above answers are "NO", then may proceed with Cephalosporin use.   . Lactose Intolerance (Gi) Diarrhea and Nausea And Vomiting    Chief Complaint  Patient presents with  . Acute Visit    Abnormal labs    HPI:  Patient is a 66 y.o. female seen today at [facility] for low hgb. Pt with hx of Includes obesity hypoventilation syndrome, history of hyperthyroidism, hypertension, dyslipidemia, diabetes, and arthritis. Pt with recent drop in hgb from 9.7 on 09/10/16 to 8.5 on 11/03/16. Staff reports no signs of bleeding. No dark stools,no blood in urine, no bloody noses or abnormal bleeding. Vital signs have been stable   Review of Systems:  Review of Systems  Unable to perform ROS: Dementia    Past Medical History:  Diagnosis Date  . Arthritis   . Asthma    09/07/16  . Diabetes mellitus, type 2 ([place])   . Hyperlipidemia   . Hypertension   . Hyperthyroidism   . Obesity hypoventilation syndrome ([place]) 06/20/2013  . Stroke [facility])    Past Surgical History:  Procedure Laterality Date  . IR GENERIC HISTORICAL  07/12/2016   IR US GUIDE VASC ACCESS RIGHT 07/12/2016 [name], MD [facility]  . IR GENERIC HISTORICAL  07/12/2016   IR FLUORO GUIDE CV LINE RIGHT 07/12/2016 [name], MD [facility]  . IR GENERIC HISTORICAL  07/17/2016   IR GASTROSTOMY TUBE MOD SED 07/17/2016 [name], MD [facility]  . IR GENERIC HISTORICAL  08/23/2016   IR REMOVAL TUN CV CATH W/O FL 08/23/2016 [name], MD [facility]  . TRACHEOSTOMY TUBE PLACEMENT N/A 07/14/2016   Procedure: TRACHEOSTOMY, THYROID ISTHMUSECTOMY;  Surgeon: [name], MD;  Location: [place];  Service: ENT;  Laterality: N/A;  . VENTRAL HERNIA REPAIR     Social History:   reports that she quit smoking about 14 years ago. Her smoking use included Cigarettes. She has a 7.50 pack-year smoking history. She has never used smokeless tobacco. She reports that she does not drink alcohol or use drugs.  Family History  Problem Relation Age of Onset  . Diabetes Mother   . Cancer Neg Hx   . Heart disease Neg Hx   . Stroke Neg Hx     Medications: Patient's Medications  New Prescriptions   No medications on file  Previous Medications   ACETAMINOPHEN (TYLENOL) 500 MG TABLET    Take 500 mg by mouth every 6 (six) hours as needed.   AMLODIPINE (NORVASC) 10 MG TABLET    Take 10 mg by mouth daily.   ASPIRIN 81 MG CHEWABLE TABLET    Chew 81 mg by mouth daily.   ATORVASTATIN (LIPITOR) 40 MG TABLET    Place 40 mg into feeding tube every evening.    B COMPLEX-VITAMIN C-FOLIC ACID (NEPHRO-VITE) 0.8 MG TABS TABLET    Take 1 tablet by mouth every morning.   BISACODYL (DULCOLAX) 10  MG SUPPOSITORY    Place 10 mg rectally as needed for moderate constipation.   BUDESONIDE (PULMICORT) 0.5 MG/2ML NEBULIZER SOLUTION    Take 0.5 mg by nebulization 2 (two) times daily.   CHOLECALCIFEROL (VITAMIN D) 1000 UNITS TABLET    Take 1,000 Units by mouth daily.   CRANBERRY 200 MG CAPS    Take 1 capsule by mouth 2 (two) times daily.   DOCUSATE SODIUM (COLACE) 100 MG CAPSULE    Take 100 mg by mouth daily.   FAMOTIDINE (PEPCID) 20 MG TABLET    Place 1 tablet (20 mg total) into feeding tube daily.   FERROUS SULFATE 325 (65 FE) MG TABLET    Take 325 mg by mouth 2 (two) times daily with a meal.   HEPARIN 5000 UNIT/ML INJECTION    Inject 1 mL  (5,000 Units total) into the skin every 8 (eight) hours.   INSULIN GLARGINE (LANTUS) 100 UNIT/ML INJECTION    Inject 20 Units into the skin at bedtime.    IPRATROPIUM-ALBUTEROL (DUONEB) 0.5-2.5 (3) MG/3ML SOLN    Take 3 mLs by nebulization every 4 (four) hours as needed (for dyspnea).   MELATONIN 3 MG TABS    Place 3 mg into feeding tube at bedtime.   MEMANTINE (NAMENDA) 10 MG TABLET    Take one tablet by mouth once daily at bedtime. Stop Date 11/16/16, Then take one tablet by mouth twice daily-Start date 11/17/2016   METHIMAZOLE (TAPAZOLE) 10 MG TABLET    Place 1 tablet (10 mg total) into feeding tube daily.   METOCLOPRAMIDE (REGLAN) 10 MG TABLET    Place 10 mg into feeding tube 3 (three) times daily.    METOPROLOL (LOPRESSOR) 50 MG TABLET    Take 50 mg by mouth 2 (two) times daily.   ONDANSETRON (ZOFRAN) 4 MG TABLET    Take 4 mg by mouth every 8 (eight) hours as needed for nausea or vomiting.   SACCHAROMYCES BOULARDII (FLORASTOR) 250 MG CAPSULE    Take 250 mg by mouth daily as needed. For loose stool   SODIUM PHOSPHATES (RA SALINE ENEMA RE)    Place 1 each rectally as needed (for constipation).   TROLAMINE SALICYLATE (ASPERCREME) 10 % CREAM    Apply to knees twice daily for pain  Modified Medications   No medications on file  Discontinued Medications   AMBULATORY NON FORMULARY MEDICATION    Give 45 mL/hr by tube See admin instructions. Tube feedings from 7p-7a only Novos[employer] @@ 53m/hr via peg and free water flushes 357mevery 3hrs.   CRANBERRY 125 MG TABS    Take one tablet by mouth twice daily     Physical Exam: Vitals:   11/07/16 0959  BP: 135/73  Pulse: 88  Resp: 18  Temp: 98.6 F (37 C)  SpO2: 99%  Weight: 218 lb (98.9 kg)  Height: '5\' 6"'$  (1.676 m)    Physical Exam  Constitutional: She appears well-developed and well-nourished. No distress.  HENT:  Head: Normocephalic and atraumatic.  Mouth/Throat: Oropharynx is clear and moist. No oropharyngeal exudate.  Eyes:  Conjunctivae are normal. Pupils are equal, round, and reactive to light.  Neck: Normal range of motion. Neck supple.  Cardiovascular: Normal rate, regular rhythm and normal heart sounds.   Pulmonary/Chest: Effort normal and breath sounds normal.  Abdominal: Soft. Bowel sounds are normal.  Musculoskeletal: She exhibits no edema or tenderness.  Neurological: She is alert.  Skin: Skin is warm and dry. She is not diaphoretic.    Labs  reviewed: Basic Metabolic Panel:  Recent Labs  08/23/16 0410  08/28/16 0626 08/30/16 0700 08/31/16 0628 09/04/16 0719 09/10/16 0419 11/03/16  NA 138  < > 133* 133* 135 133* 134* 136*  K 4.3  < > 5.2* 5.3* 4.5 4.5 4.6 4.5  CL 104  < > 95* 97* 98* 98* 102  --   CO2 27  < > '30 30 28 25 '$ 21*  --   GLUCOSE 150*  < > 174* 154* 202* 153* 166*  --   BUN 11  < > '11 18 19 '$ 28* 21* 19  CREATININE 0.95  < > 1.02* 1.34* 1.41* 1.51* 1.41* 1.3*  CALCIUM 8.8*  < > 9.0 9.3 9.2 9.4 10.4*  --   MG 1.9  --  1.3* 2.1  --   --   --   --   PHOS  --   --  4.2 5.1* 5.3*  --   --   --   < > = values in this interval not displayed.  Estimated Creatinine Clearance: 52.5 mL/min (by C-G formula based on SCr of 1.3 mg/dL (A)).  Liver Function Tests:  Recent Labs  07/19/16 0633  08/09/16 2026  08/19/16 0628  08/28/16 0626 08/30/16 0700 08/31/16 0628  AST 23  --  22  --  17  --   --   --   --   ALT 23  --  11*  --  9*  --   --   --   --   ALKPHOS 135*  --  93  --  63  --   --   --   --   BILITOT 0.9  --  0.7  --  0.3  --   --   --   --   PROT 6.0*  --  5.8*  --  4.7*  --   --   --   --   ALBUMIN 1.5*  < > 1.7*  < > 1.3*  < > 1.8* 2.1* 2.1*  < > = values in this interval not displayed. No results for input(s): LIPASE, AMYLASE in the last 8760 hours.  Recent Labs  06/22/16 0334 06/23/16 0332 06/29/16 1130  AMMONIA 72* 60* 21   CBC:  Recent Labs  08/22/16 0653 08/28/16 0626 08/30/16 0700 09/04/16 0719 09/10/16 0419 11/03/16  WBC 10.9* 7.4 7.3 7.4 12.0* 6.8    NEUTROABS 8.2* 4.4  --   --  8.0*  --   HGB 7.4* 7.6* 7.8* 8.0* 9.7* 8.5*  HCT 23.2* 23.5* 24.5* 25.2* 29.9* 27*  MCV 89.2 87.4 88.4 88.7 87.9  --   PLT 265 298 303 317 327 242   TSH:  Recent Labs  08/05/16 0628 08/22/16 0653 11/03/16  TSH 0.641 2.277 2.46   A1C: Lab Results  Component Value Date   HGBA1C 5.4 08/12/2016   Lipid Panel:  Recent Labs  08/09/16 2026  TRIG 69     Assessment/Plan 1. Anemia, unspecified type Will have staff hemoccult stools x 3, to follow up CBC 11/10/16 We will dc heparin (being used since hospitalization for DVT prophylactics -cont to monitor VS and monitor for bleeding.    Carlos American. Harle Battiest  [facility] [phone] 8 am - 5 pm) [phone] (after hours)

## 2016-11-08 LAB — CBC AND DIFFERENTIAL
HCT: 29 % — AB (ref 36–46)
Hemoglobin: 9.1 g/dL — AB (ref 12.0–16.0)
PLATELETS: 247 10*3/uL (ref 150–399)
WBC: 9.4 10*3/mL

## 2016-11-09 ENCOUNTER — Encounter (HOSPITAL_COMMUNITY): Payer: Self-pay | Admitting: Radiology

## 2016-11-09 ENCOUNTER — Ambulatory Visit (HOSPITAL_COMMUNITY)
Admission: RE | Admit: 2016-11-09 | Discharge: 2016-11-09 | Disposition: A | Discharge status: Home / Self Care | Payer: PRIVATE HEALTH INSURANCE | Source: Ambulatory Visit | Attending: Internal Medicine | Admitting: Internal Medicine

## 2016-11-09 DIAGNOSIS — Z8673 Personal history of transient ischemic attack (TIA), and cerebral infarction without residual deficits: Secondary | ICD-10-CM | POA: Diagnosis not present

## 2016-11-09 DIAGNOSIS — Z930 Tracheostomy status: Secondary | ICD-10-CM

## 2016-11-09 DIAGNOSIS — Z431 Encounter for attention to gastrostomy: Secondary | ICD-10-CM | POA: Insufficient documentation

## 2016-11-09 HISTORY — PX: IR GENERIC HISTORICAL: IMG1180011

## 2016-11-09 MED ORDER — LIDOCAINE VISCOUS 2 % MT SOLN
OROMUCOSAL | Status: AC
  Filled 2016-11-09: qty 15

## 2016-11-22 ENCOUNTER — Non-Acute Institutional Stay (SKILLED_NURSING_FACILITY): Payer: Medicare Other | Admitting: Nurse Practitioner

## 2016-11-22 DIAGNOSIS — I519 Heart disease, unspecified: Secondary | ICD-10-CM | POA: Diagnosis not present

## 2016-11-22 DIAGNOSIS — D649 Anemia, unspecified: Secondary | ICD-10-CM | POA: Diagnosis not present

## 2016-11-22 DIAGNOSIS — E1149 Type 2 diabetes mellitus with other diabetic neurological complication: Secondary | ICD-10-CM | POA: Diagnosis not present

## 2016-11-22 DIAGNOSIS — Z794 Long term (current) use of insulin: Secondary | ICD-10-CM

## 2016-11-22 DIAGNOSIS — R4189 Other symptoms and signs involving cognitive functions and awareness: Secondary | ICD-10-CM

## 2016-11-22 DIAGNOSIS — E0590 Thyrotoxicosis, unspecified without thyrotoxic crisis or storm: Secondary | ICD-10-CM | POA: Diagnosis not present

## 2016-11-22 DIAGNOSIS — I5189 Other ill-defined heart diseases: Secondary | ICD-10-CM

## 2016-11-22 NOTE — Progress Notes (Signed)
Nursing Home Location: [facility] Room: [place] of Service: SNF (31)  PCP: Delia Chimes, NP   Code Status: Full Code  Allergies  Allergen Reactions  . Penicillins Swelling    FACIAL SWELLING Has patient had a PCN reaction causing immediate rash, facial/tongue/throat swelling, SOB or lightheadedness with hypotension: Yes Has patient had a PCN reaction causing severe rash involving mucus membranes or skin necrosis: No Has patient had a PCN reaction that required hospitalization: No Has patient had a PCN reaction occurring within the last 10 years: No If all of the above answers are "NO", then may proceed with Cephalosporin use.   . Lactose Intolerance (Gi) Diarrhea and Nausea And Vomiting    Chief Complaint  Patient presents with  . Medical Management of Chronic Issues    Resident is being seen for routine visit.     HPI:  Patient is a 66 y.o. female seen today at [facility] for routine follow up on chronic conditions. Pt with hx of Includes obesity hypoventilation syndrome, history of hyperthyroidism, hypertension, dyslipidemia, diabetes, and arthritis.  Staff without concerns today. No signs of bleeding noted.  Pt in the bed slow to respond and appears sleepy however this is baseline per nursing as she stays up late and normal sleeps until late morning.  No changes in bowel or bladder per nursing.  Review of Systems:  Review of Systems  Unable to perform ROS: Dementia    Past Medical History:  Diagnosis Date  . Arthritis   . Asthma    09/07/16  . Diabetes mellitus, type 2 ([place])   . Hyperlipidemia   . Hypertension   . Hyperthyroidism   . Obesity hypoventilation syndrome ([place]) 06/20/2013  . Stroke [facility])    Past Surgical History:  Procedure Laterality Date  . IR GENERIC HISTORICAL  07/12/2016   IR US GUIDE VASC ACCESS RIGHT 07/12/2016 [name], MD [facility]  . IR GENERIC HISTORICAL  07/12/2016   IR FLUORO GUIDE CV LINE  RIGHT 07/12/2016 [name], MD [facility]  . IR GENERIC HISTORICAL  07/17/2016   IR GASTROSTOMY TUBE MOD SED 07/17/2016 [name], MD [facility]  . IR GENERIC HISTORICAL  08/23/2016   IR REMOVAL TUN CV CATH W/O FL 08/23/2016 [name], MD [facility]  . IR GENERIC HISTORICAL  11/09/2016   IR GASTROSTOMY TUBE REMOVAL 11/09/2016 [name], PA-C [facility]  . TRACHEOSTOMY TUBE PLACEMENT N/A 07/14/2016   Procedure: TRACHEOSTOMY, THYROID ISTHMUSECTOMY;  Surgeon: [name], MD;  Location: [place];  Service: ENT;  Laterality: N/A;  . VENTRAL HERNIA REPAIR     Social History:   reports that she quit smoking about 14 years ago. Her smoking use included Cigarettes. She has a 7.50 pack-year smoking history. She has never used smokeless tobacco. She reports that she does not drink alcohol or use drugs.  Family History  Problem Relation Age of Onset  . Diabetes Mother   . Cancer Neg Hx   . Heart disease Neg Hx   . Stroke Neg Hx     Medications: Patient's Medications  New Prescriptions   No medications on file  Previous Medications   ACETAMINOPHEN (TYLENOL) 500 MG TABLET    Take 500 mg by mouth every 6 (six) hours as needed.   AMLODIPINE (NORVASC) 10 MG TABLET    Take 10 mg by mouth daily.   ASPIRIN 81 MG CHEWABLE TABLET    Chew 81 mg by mouth daily.   ATORVASTATIN (  LIPITOR) 40 MG TABLET    Place 40 mg into feeding tube every evening.    B COMPLEX-VITAMIN C-FOLIC ACID (NEPHRO-VITE) 0.8 MG TABS TABLET    Take 1 tablet by mouth every morning.   BISACODYL (DULCOLAX) 10 MG SUPPOSITORY    Place 10 mg rectally as needed for moderate constipation.   BUDESONIDE (PULMICORT) 0.5 MG/2ML NEBULIZER SOLUTION    Take 0.5 mg by nebulization 2 (two) times daily.   CHOLECALCIFEROL (VITAMIN D) 1000 UNITS TABLET    Take 1,000 Units by mouth daily.   CRANBERRY 200 MG CAPS    Take 1 capsule by mouth 2 (two) times daily.   DOCUSATE SODIUM (COLACE) 100 MG CAPSULE    Take 100 mg by mouth  daily.   FAMOTIDINE (PEPCID) 20 MG TABLET    Place 1 tablet (20 mg total) into feeding tube daily.   FERROUS SULFATE 325 (65 FE) MG TABLET    Take 325 mg by mouth 2 (two) times daily with a meal.   FUROSEMIDE (LASIX) 20 MG TABLET    Take 20 mg by mouth.   INSULIN GLARGINE (LANTUS) 100 UNIT/ML INJECTION    Inject 20 Units into the skin at bedtime.    IPRATROPIUM-ALBUTEROL (DUONEB) 0.5-2.5 (3) MG/3ML SOLN    Take 3 mLs by nebulization every 4 (four) hours as needed (for dyspnea).   MELATONIN 3 MG TABS    Place 3 mg into feeding tube at bedtime.   MEMANTINE (NAMENDA) 10 MG TABLET    Take 10 mg by mouth 2 (two) times daily.    METHIMAZOLE (TAPAZOLE) 10 MG TABLET    Place 1 tablet (10 mg total) into feeding tube daily.   METOCLOPRAMIDE (REGLAN) 10 MG TABLET    Place 10 mg into feeding tube 3 (three) times daily.    METOPROLOL (LOPRESSOR) 50 MG TABLET    Take 50 mg by mouth 2 (two) times daily.   ONDANSETRON (ZOFRAN) 4 MG TABLET    Take 4 mg by mouth every 8 (eight) hours as needed for nausea or vomiting.   SACCHAROMYCES BOULARDII (FLORASTOR) 250 MG CAPSULE    Take 250 mg by mouth daily as needed. For loose stool   SODIUM PHOSPHATES (RA SALINE ENEMA RE)    Place 1 each rectally as needed (for constipation).   TROLAMINE SALICYLATE (ASPERCREME) 10 % CREAM    Apply to knees twice daily for pain  Modified Medications   No medications on file  Discontinued Medications   HEPARIN 5000 UNIT/ML INJECTION    Inject 1 mL (5,000 Units total) into the skin every 8 (eight) hours.     Physical Exam: Vitals:   11/22/16 1318  BP: 126/78  Pulse: 81  Resp: 20  Temp: 98.5 F (36.9 C)  SpO2: 97%  Weight: 205 lb 9.6 oz (93.3 kg)  Height: '5\' 6"'$  (1.676 m)    Physical Exam  Constitutional: She appears well-developed and well-nourished. No distress.  HENT:  Head: Normocephalic and atraumatic.  Mouth/Throat: Oropharynx is clear and moist. No oropharyngeal exudate.  Eyes: Conjunctivae are normal. Pupils are  equal, round, and reactive to light.  Neck: Normal range of motion. Neck supple.  Cardiovascular: Normal rate, regular rhythm and normal heart sounds.   Pulmonary/Chest: Effort normal and breath sounds normal.  Abdominal: Soft. Bowel sounds are normal.  Musculoskeletal: She exhibits no edema or tenderness.  Neurological: She is alert.  Skin: Skin is warm and dry. She is not diaphoretic.    Labs reviewed: Basic Metabolic  Panel:  Recent Labs  08/23/16 0410  08/28/16 0626 08/30/16 0700 08/31/16 0628 09/04/16 0719 09/10/16 0419 11/03/16  NA 138  < > 133* 133* 135 133* 134* 136*  K 4.3  < > 5.2* 5.3* 4.5 4.5 4.6 4.5  CL 104  < > 95* 97* 98* 98* 102  --   CO2 27  < > '30 30 28 25 '$ 21*  --   GLUCOSE 150*  < > 174* 154* 202* 153* 166*  --   BUN 11  < > '11 18 19 '$ 28* 21* 19  CREATININE 0.95  < > 1.02* 1.34* 1.41* 1.51* 1.41* 1.3*  CALCIUM 8.8*  < > 9.0 9.3 9.2 9.4 10.4*  --   MG 1.9  --  1.3* 2.1  --   --   --   --   PHOS  --   --  4.2 5.1* 5.3*  --   --   --   < > = values in this interval not displayed.  Estimated Creatinine Clearance: 51 mL/min (by C-G formula based on SCr of 1.3 mg/dL (A)).  Liver Function Tests:  Recent Labs  07/19/16 0633  08/09/16 2026  08/19/16 0628  08/28/16 0626 08/30/16 0700 08/31/16 0628  AST 23  --  22  --  17  --   --   --   --   ALT 23  --  11*  --  9*  --   --   --   --   ALKPHOS 135*  --  93  --  63  --   --   --   --   BILITOT 0.9  --  0.7  --  0.3  --   --   --   --   PROT 6.0*  --  5.8*  --  4.7*  --   --   --   --   ALBUMIN 1.5*  < > 1.7*  < > 1.3*  < > 1.8* 2.1* 2.1*  < > = values in this interval not displayed. No results for input(s): LIPASE, AMYLASE in the last 8760 hours.  Recent Labs  06/22/16 0334 06/23/16 0332 06/29/16 1130  AMMONIA 72* 60* 21   CBC:  Recent Labs  08/22/16 0653 08/28/16 0626 08/30/16 0700 09/04/16 0719 09/10/16 0419 11/03/16  WBC 10.9* 7.4 7.3 7.4 12.0* 6.8  NEUTROABS 8.2* 4.4  --   --  8.0*  --     HGB 7.4* 7.6* 7.8* 8.0* 9.7* 8.5*  HCT 23.2* 23.5* 24.5* 25.2* 29.9* 27*  MCV 89.2 87.4 88.4 88.7 87.9  --   PLT 265 298 303 317 327 242   TSH:  Recent Labs  08/05/16 0628 08/22/16 0653 11/03/16  TSH 0.641 2.277 2.46   A1C: Lab Results  Component Value Date   HGBA1C 5.4 08/12/2016   Lipid Panel:  Recent Labs  08/09/16 2026  TRIG 69     Assessment/Plan 1. Anemia, unspecified type Per daughter pt with chronic anemia requiring transfusions in the past, no signs of bleeding at this time. hemoccults negative. May need hematology referral if hgb conts to trend down. Cont iron supplement   2. Cognitive impairment conts on namenda 10 mg PO BID  3. Type 2 diabetes mellitus with other neurologic complication, with long-term current use of insulin (HCC) a1C  At goal on last labs, will cont current regimen of lantus   4. Hyperthyroidism TSH stable on tapazole  5. Diastolic dysfunction Stable, conts on lopressor  and lasix.   Angelica Beck. Harle Battiest  [facility] [phone] 8 am - 5 pm) [phone] (after hours)

## 2016-11-28 ENCOUNTER — Encounter: Payer: Self-pay | Admitting: Internal Medicine

## 2016-11-28 ENCOUNTER — Non-Acute Institutional Stay (SKILLED_NURSING_FACILITY): Payer: Medicare Other | Admitting: Internal Medicine

## 2016-11-28 DIAGNOSIS — R4189 Other symptoms and signs involving cognitive functions and awareness: Secondary | ICD-10-CM | POA: Diagnosis not present

## 2016-11-28 DIAGNOSIS — M170 Bilateral primary osteoarthritis of knee: Secondary | ICD-10-CM

## 2016-11-28 DIAGNOSIS — R739 Hyperglycemia, unspecified: Secondary | ICD-10-CM | POA: Diagnosis not present

## 2016-11-28 NOTE — Progress Notes (Signed)
Facility Location: [facility]  Room Number: 218-B  Code Status: Full Code   This is a nursing facility follow up for specific acute issue of right leg pain.  Interim medical record and care since last [place] visit was updated with review of diagnostic studies and change in clinical status since last visit were documented.  HPI: There was no injury or predisposition to the leg pain. Venous Doppler was negative. Acetaminophen 1 tablet 500 mg every 6 hours as needed has been ineffective. Also Trolamine 10% cream to the knees twice a day is being employed. The pain has been affecting her ability to participate in PT/OT. Medication review reveals that she is on Reglan. This would be contraindicated in view of her mental status changes.  Review of systems: Dementia invalidated responses ;all of which were negative. She was unable to give the date. She cannot name the president. She was able give me her correct date of birth. When asked for her daughter's phone number she pointed to a sheet taped to the wall with the number listed . She specifically denies any joint pain, or joint redness swelling. She is on Lantus, she denies diabetes. Cookies and sugar candy (Bit of Honey) were being ingested at the time of the evaluation.  Constitutional: No fever,significant weight change, fatigue  Eyes: No redness, discharge, pain, vision change ENT/mouth: No nasal congestion,  purulent discharge, earache,change in hearing ,sore throat  Cardiovascular: No chest pain, palpitations,paroxysmal nocturnal dyspnea, claudication, edema  Respiratory: No cough, sputum production,hemoptysis, DOE , significant snoring,apnea  Gastrointestinal: No heartburn,dysphagia,abdominal pain, nausea / vomiting,rectal bleeding, melena,change in bowels Genitourinary: No dysuria,hematuria, pyuria,  incontinence, nocturia Musculoskeletal: No joint stiffness, joint swelling,  weakness,pain Dermatologic: No rash, pruritus, change in appearance of skin Neurologic: No dizziness,headache,syncope, seizures, numbness , tingling Psychiatric: No significant anxiety , depression, insomnia, anorexia Endocrine: No change in hair/skin/ nails, excessive thirst, excessive hunger, excessive urination  Hematologic/lymphatic: No significant bruising, lymphadenopathy,abnormal bleeding Allergy/immunology: No itchy/ watery eyes, significant sneezing, urticaria, angioedema  Physical exam:  Pertinent or positive findings: Affect is flat. She exhibits recurrent pursing of the lips almost mimicing mastication maneuver.She is edentulous. She has scattered rhonchi without increased work of breathing. Heart sounds are somewhat obscured. There is trace pedal edema at the sock line. Posterior tibial pulses are decreased. She has fusiform changes in the knees with slight crepitus. She describes pain with range of motion of the knees.  General appearance:Adequately nourished; no acute distress , increased work of breathing is present.   Lymphatic: No lymphadenopathy about the head, neck, axilla . Eyes: No conjunctival inflammation or lid edema is present. There is no scleral icterus. Ears:  External ear exam shows no significant lesions or deformities.   Nose:  External nasal examination shows no deformity or inflammation. Nasal mucosa are pink and moist without lesions ,exudates Oral exam: lips and gums are healthy appearing.There is no oropharyngeal erythema or exudate . Neck:  No thyromegaly, masses, tenderness noted.    Heart:  Normal rate and regular rhythm. S1 and S2 normal without gallop, murmur, click, rub .  Abdomen:Bowel sounds are normal. Abdomen is soft and nontender with no organomegaly, hernias,masses. GU: deferred  Extremities:  No cyanosis, clubbing Neurologic exam : Strength equal  in upper & lower extremities but decreased Balance,Rhomberg,finger to nose testing could not be  completed due to clinical state Deep tendon reflexes are equal Skin: Warm & dry w/o tenting. No significant lesions or rash.  See  summary under each active problem in the Problem List with associated updated therapeutic plan

## 2016-11-28 NOTE — Assessment & Plan Note (Addendum)
11/28/16 patient noncompliant with dietary restrictions Recheck A1c

## 2016-11-28 NOTE — Assessment & Plan Note (Signed)
Trial of meloxicam 7.5 mg twice a day prn

## 2016-11-28 NOTE — Assessment & Plan Note (Addendum)
11/28/16 she is unable to give the date or the name of the president On Namenda; adding  Aricept unlikely to be of benefit Discontinue Reglan because of its potential adverse CNS and cardiac effects

## 2016-11-28 NOTE — Patient Instructions (Signed)
See assessment and plan under each diagnosis in the problem list and acutely for this visit 

## 2016-11-29 LAB — HEMOGLOBIN A1C: Hemoglobin A1C: 6.5

## 2016-12-20 ENCOUNTER — Encounter: Payer: Self-pay | Admitting: Nurse Practitioner

## 2016-12-20 ENCOUNTER — Non-Acute Institutional Stay (SKILLED_NURSING_FACILITY): Payer: Medicare Other | Admitting: Nurse Practitioner

## 2016-12-20 DIAGNOSIS — E0590 Thyrotoxicosis, unspecified without thyrotoxic crisis or storm: Secondary | ICD-10-CM

## 2016-12-20 DIAGNOSIS — D649 Anemia, unspecified: Secondary | ICD-10-CM

## 2016-12-20 DIAGNOSIS — Z794 Long term (current) use of insulin: Secondary | ICD-10-CM | POA: Diagnosis not present

## 2016-12-20 DIAGNOSIS — I5030 Unspecified diastolic (congestive) heart failure: Secondary | ICD-10-CM | POA: Diagnosis not present

## 2016-12-20 DIAGNOSIS — E1149 Type 2 diabetes mellitus with other diabetic neurological complication: Secondary | ICD-10-CM

## 2016-12-20 DIAGNOSIS — Z8673 Personal history of transient ischemic attack (TIA), and cerebral infarction without residual deficits: Secondary | ICD-10-CM

## 2016-12-20 DIAGNOSIS — J441 Chronic obstructive pulmonary disease with (acute) exacerbation: Secondary | ICD-10-CM

## 2016-12-20 DIAGNOSIS — R0602 Shortness of breath: Secondary | ICD-10-CM | POA: Diagnosis not present

## 2016-12-20 NOTE — Progress Notes (Signed)
Nursing Home Location: [facility] Room: [place] of Service: SNF [phone])  PCP: Delia Chimes, NP (Inactive)   Code Status: Full Code  Allergies  Allergen Reactions  . Penicillins Swelling    FACIAL SWELLING Has patient had a PCN reaction causing immediate rash, facial/tongue/throat swelling, SOB or lightheadedness with hypotension: Yes Has patient had a PCN reaction causing severe rash involving mucus membranes or skin necrosis: No Has patient had a PCN reaction that required hospitalization: No Has patient had a PCN reaction occurring within the last 10 years: No If all of the above answers are "NO", then may proceed with Cephalosporin use.   . Lactose Intolerance (Gi) Diarrhea and Nausea And Vomiting    Chief Complaint  Patient presents with  . Acute Visit    Resident is being seen due increased shortness of breath.  . Medical Management of Chronic Issues    HPI:  Patient is a 66 y.o. female seen today at [facility] due to increase work of breathing noted by nursing and for routine follow up on chronic conditions. Pt with hx of Includes obesity hypoventilation syndrome, history of hyperthyroidism, hypertension, dyslipidemia, diabetes, and arthritis. Staff notes on morning rounds Ms Chamblin appeared more short of breath with increase RR. Pt denies shortness of breath, cough or congestion but visible appeared short of breath with increase work of breathing. duoneb was given with some improvement noted therefore albuterol was given which she had significant improvement. With with memory loss therefore is a poor historian.   Review of Systems:  Review of Systems  Unable to perform ROS: Dementia    Past Medical History:  Diagnosis Date  . Arthritis   . Asthma    09/07/16  . Diabetes mellitus, type 2 ([place])   . Hyperlipidemia   . Hypertension   . Hyperthyroidism   . Obesity hypoventilation syndrome ([place]) 06/20/2013  . Stroke [facility])    Past  Surgical History:  Procedure Laterality Date  . IR GENERIC HISTORICAL  07/12/2016   IR US GUIDE VASC ACCESS RIGHT 07/12/2016 [name], MD [facility]  . IR GENERIC HISTORICAL  07/12/2016   IR FLUORO GUIDE CV LINE RIGHT 07/12/2016 [name], MD [facility]  . IR GENERIC HISTORICAL  07/17/2016   IR GASTROSTOMY TUBE MOD SED 07/17/2016 [name], MD [facility]  . IR GENERIC HISTORICAL  08/23/2016   IR REMOVAL TUN CV CATH W/O FL 08/23/2016 [name], MD [facility]  . IR GENERIC HISTORICAL  11/09/2016   IR GASTROSTOMY TUBE REMOVAL 11/09/2016 [name], PA-C [facility]  . TRACHEOSTOMY TUBE PLACEMENT N/A 07/14/2016   Procedure: TRACHEOSTOMY, THYROID ISTHMUSECTOMY;  Surgeon: [name], MD;  Location: [place];  Service: ENT;  Laterality: N/A;  . VENTRAL HERNIA REPAIR     Social History:   reports that she quit smoking about 14 years ago. Her smoking use included Cigarettes. She has a 7.50 pack-year smoking history. She has never used smokeless tobacco. She reports that she does not drink alcohol or use drugs.  Family History  Problem Relation Age of Onset  . Diabetes Mother   . Cancer Neg Hx   . Heart disease Neg Hx   . Stroke Neg Hx     Medications: Patient's Medications  New Prescriptions   No medications on file  Previous Medications   ACETAMINOPHEN (TYLENOL) 500 MG TABLET    Take 500 mg by mouth every 6 (six) hours as needed.   AMLODIPINE (NORVASC) 10  MG TABLET    Take 10 mg by mouth daily.   ASPIRIN 81 MG CHEWABLE TABLET    Chew 81 mg by mouth daily.   ATORVASTATIN (LIPITOR) 40 MG TABLET    Place 40 mg into feeding tube every evening.    B COMPLEX-VITAMIN C-FOLIC ACID (NEPHRO-VITE) 0.8 MG TABS TABLET    Take 1 tablet by mouth every morning.   BISACODYL (DULCOLAX) 10 MG SUPPOSITORY    Place 10 mg rectally as needed for moderate constipation.   BUDESONIDE (PULMICORT) 0.5 MG/2ML NEBULIZER SOLUTION    Take 0.5 mg by nebulization 2 (two) times daily.     CHOLECALCIFEROL (VITAMIN D) 1000 UNITS TABLET    Take 1,000 Units by mouth daily.   CRANBERRY 200 MG CAPS    Take 1 capsule by mouth 2 (two) times daily.   DOCUSATE SODIUM (COLACE) 100 MG CAPSULE    Take 100 mg by mouth daily.   FAMOTIDINE (PEPCID) 20 MG TABLET    Place 1 tablet (20 mg total) into feeding tube daily.   FERROUS SULFATE 325 (65 FE) MG TABLET    Take 325 mg by mouth 2 (two) times daily with a meal.   FUROSEMIDE (LASIX) 20 MG TABLET    Take 20 mg by mouth.   INSULIN GLARGINE (LANTUS) 100 UNIT/ML INJECTION    Inject 20 Units into the skin at bedtime.    IPRATROPIUM-ALBUTEROL (DUONEB) 0.5-2.5 (3) MG/3ML SOLN    Take 3 mLs by nebulization every 4 (four) hours as needed (for dyspnea).   MELATONIN 3 MG TABS    Place 3 mg into feeding tube at bedtime.   MELOXICAM (MOBIC) 7.5 MG TABLET    Take 1 tablet (7.5 mg total) by mouth 2 (two) times daily as needed for pain.   MEMANTINE (NAMENDA) 10 MG TABLET    Take 10 mg by mouth 2 (two) times daily.    METHIMAZOLE (TAPAZOLE) 10 MG TABLET    Place 1 tablet (10 mg total) into feeding tube daily.   METOPROLOL (LOPRESSOR) 50 MG TABLET    Take 50 mg by mouth 2 (two) times daily.   ONDANSETRON (ZOFRAN) 4 MG TABLET    Take 4 mg by mouth every 8 (eight) hours as needed for nausea or vomiting.   SACCHAROMYCES BOULARDII (FLORASTOR) 250 MG CAPSULE    Take 250 mg by mouth daily as needed. For loose stool   SODIUM PHOSPHATES (RA SALINE ENEMA RE)    Place 1 each rectally as needed (for constipation).   TROLAMINE SALICYLATE (ASPERCREME) 10 % CREAM    Apply to knees twice daily for pain  Modified Medications   No medications on file  Discontinued Medications   No medications on file     Physical Exam: Vitals:   12/20/16 1345  BP: 117/64  Pulse: 84  Resp: (!) 26  Temp: 98.9 F (37.2 C)  SpO2: 99%  Weight: 211 lb 12.8 oz (96.1 kg)  Height: '5\' 6"'$  (1.676 m)    Physical Exam  Constitutional: She appears well-developed and well-nourished. No  distress.  HENT:  Head: Normocephalic and atraumatic.  Mouth/Throat: Oropharynx is clear and moist. No oropharyngeal exudate.  Eyes: Conjunctivae are normal. Pupils are equal, round, and reactive to light.  Neck: Normal range of motion. Neck supple.  Cardiovascular: Normal rate, regular rhythm and normal heart sounds.   Pulmonary/Chest: Effort normal. She has wheezes (throughout).  Abdominal: Soft. Bowel sounds are normal.  Musculoskeletal: She exhibits no edema or tenderness.  Neurological:  She is alert.  Skin: Skin is warm and dry. She is not diaphoretic.    Labs reviewed: Basic Metabolic Panel:  Recent Labs  08/23/16 0410  08/28/16 0626 08/30/16 0700 08/31/16 0628 09/04/16 0719 09/10/16 0419 11/03/16  NA 138  < > 133* 133* 135 133* 134* 136*  K 4.3  < > 5.2* 5.3* 4.5 4.5 4.6 4.5  CL 104  < > 95* 97* 98* 98* 102  --   CO2 27  < > '30 30 28 25 '$ 21*  --   GLUCOSE 150*  < > 174* 154* 202* 153* 166*  --   BUN 11  < > '11 18 19 '$ 28* 21* 19  CREATININE 0.95  < > 1.02* 1.34* 1.41* 1.51* 1.41* 1.3*  CALCIUM 8.8*  < > 9.0 9.3 9.2 9.4 10.4*  --   MG 1.9  --  1.3* 2.1  --   --   --   --   PHOS  --   --  4.2 5.1* 5.3*  --   --   --   < > = values in this interval not displayed.  CrCl cannot be calculated (Patient's most recent lab result is older than the maximum 21 days allowed.).  Liver Function Tests:  Recent Labs  07/19/16 0633  08/09/16 2026  08/19/16 0628  08/28/16 0626 08/30/16 0700 08/31/16 0628  AST 23  --  22  --  17  --   --   --   --   ALT 23  --  11*  --  9*  --   --   --   --   ALKPHOS 135*  --  93  --  63  --   --   --   --   BILITOT 0.9  --  0.7  --  0.3  --   --   --   --   PROT 6.0*  --  5.8*  --  4.7*  --   --   --   --   ALBUMIN 1.5*  < > 1.7*  < > 1.3*  < > 1.8* 2.1* 2.1*  < > = values in this interval not displayed. No results for input(s): LIPASE, AMYLASE in the last 8760 hours.  Recent Labs  06/22/16 0334 06/23/16 0332 06/29/16 1130  AMMONIA 72*  60* 21   CBC:  Recent Labs  08/22/16 0653 08/28/16 0626 08/30/16 0700 09/04/16 0719 09/10/16 0419 11/03/16 11/08/16  WBC 10.9* 7.4 7.3 7.4 12.0* 6.8 9.4  NEUTROABS 8.2* 4.4  --   --  8.0*  --   --   HGB 7.4* 7.6* 7.8* 8.0* 9.7* 8.5* 9.1*  HCT 23.2* 23.5* 24.5* 25.2* 29.9* 27* 29*  MCV 89.2 87.4 88.4 88.7 87.9  --   --   PLT 265 298 303 317 327 242 247   TSH:  Recent Labs  08/05/16 0628 08/22/16 0653 11/03/16  TSH 0.641 2.277 2.46   A1C: Lab Results  Component Value Date   HGBA1C 6.5 11/29/2016   Lipid Panel:  Recent Labs  08/09/16 2026  TRIG 69   Chest X-Ray AP/LAT: 12/20/16 PA and lateral chest reveal heart and mediastinum to be normal. Left basilar infiltrate present and costophrenic angles are clear.  IMPRESSION: Left basilar infiltrate  Assessment/Plan 1. COPD exacerbation ([place]) originally without much air movement to lungs however Responded to 2 neb treatments with decrease work of breathing, and improved breath sounds throughout all lung fields. will start prednisone taper  and avelox based on infiltrate on chest xray To cont duonebs q 6 hour schedule and VS q shift.   2. Anemia, unspecified type hgb stable on recent labs, no signs of bleeding. will cont iron BID with meals.   3. History of CVA (cerebrovascular accident) Stable, remains on ASA 81 mg daily   4. Type 2 diabetes mellitus with other neurologic complication, with long-term current use of insulin (HCC) A1c at goal despite pt not being compliant with diet. conts on lantus 20 units daily   5. Hyperthyroidism TSH in appropriate range in February, conts on tapazole.   6. CHF No signs of fluid overload, conts on lasix 20 mg daily and lopressor 50 mg BID    Syona Wroblewski K. Harle Battiest  [facility] [phone] 8 am - 5 pm) [phone] (after hours)

## 2016-12-21 ENCOUNTER — Encounter (HOSPITAL_COMMUNITY): Payer: Self-pay

## 2016-12-21 ENCOUNTER — Inpatient Hospital Stay (HOSPITAL_COMMUNITY)
Admission: EM | Admit: 2016-12-21 | Discharge: 2016-12-24 | DRG: 193 | Disposition: A | Discharge status: Skilled Nursing Facility | Payer: Medicare Other | Attending: Family Medicine | Admitting: Family Medicine

## 2016-12-21 ENCOUNTER — Non-Acute Institutional Stay (SKILLED_NURSING_FACILITY): Payer: Medicare Other | Admitting: Internal Medicine

## 2016-12-21 ENCOUNTER — Emergency Department (HOSPITAL_COMMUNITY): Payer: Medicare Other

## 2016-12-21 ENCOUNTER — Encounter: Payer: Self-pay | Admitting: Internal Medicine

## 2016-12-21 DIAGNOSIS — Z7951 Long term (current) use of inhaled steroids: Secondary | ICD-10-CM

## 2016-12-21 DIAGNOSIS — N179 Acute kidney failure, unspecified: Secondary | ICD-10-CM | POA: Diagnosis present

## 2016-12-21 DIAGNOSIS — J189 Pneumonia, unspecified organism: Principal | ICD-10-CM | POA: Diagnosis present

## 2016-12-21 DIAGNOSIS — J45909 Unspecified asthma, uncomplicated: Secondary | ICD-10-CM | POA: Diagnosis present

## 2016-12-21 DIAGNOSIS — Z8709 Personal history of other diseases of the respiratory system: Secondary | ICD-10-CM

## 2016-12-21 DIAGNOSIS — Z79899 Other long term (current) drug therapy: Secondary | ICD-10-CM

## 2016-12-21 DIAGNOSIS — I509 Heart failure, unspecified: Secondary | ICD-10-CM

## 2016-12-21 DIAGNOSIS — Y95 Nosocomial condition: Secondary | ICD-10-CM | POA: Diagnosis present

## 2016-12-21 DIAGNOSIS — E739 Lactose intolerance, unspecified: Secondary | ICD-10-CM | POA: Diagnosis present

## 2016-12-21 DIAGNOSIS — J9601 Acute respiratory failure with hypoxia: Secondary | ICD-10-CM | POA: Diagnosis present

## 2016-12-21 DIAGNOSIS — R0602 Shortness of breath: Secondary | ICD-10-CM

## 2016-12-21 DIAGNOSIS — E662 Morbid (severe) obesity with alveolar hypoventilation: Secondary | ICD-10-CM | POA: Diagnosis present

## 2016-12-21 DIAGNOSIS — N183 Chronic kidney disease, stage 3 (moderate): Secondary | ICD-10-CM | POA: Diagnosis present

## 2016-12-21 DIAGNOSIS — E785 Hyperlipidemia, unspecified: Secondary | ICD-10-CM | POA: Diagnosis present

## 2016-12-21 DIAGNOSIS — Z880 Allergy status to penicillin: Secondary | ICD-10-CM | POA: Diagnosis not present

## 2016-12-21 DIAGNOSIS — Z7982 Long term (current) use of aspirin: Secondary | ICD-10-CM | POA: Diagnosis not present

## 2016-12-21 DIAGNOSIS — Z833 Family history of diabetes mellitus: Secondary | ICD-10-CM | POA: Diagnosis not present

## 2016-12-21 DIAGNOSIS — E162 Hypoglycemia, unspecified: Secondary | ICD-10-CM | POA: Diagnosis not present

## 2016-12-21 DIAGNOSIS — Z794 Long term (current) use of insulin: Secondary | ICD-10-CM

## 2016-12-21 DIAGNOSIS — Z87891 Personal history of nicotine dependence: Secondary | ICD-10-CM | POA: Diagnosis not present

## 2016-12-21 DIAGNOSIS — I2510 Atherosclerotic heart disease of native coronary artery without angina pectoris: Secondary | ICD-10-CM | POA: Diagnosis present

## 2016-12-21 DIAGNOSIS — I10 Essential (primary) hypertension: Secondary | ICD-10-CM | POA: Diagnosis present

## 2016-12-21 DIAGNOSIS — Z6834 Body mass index (BMI) 34.0-34.9, adult: Secondary | ICD-10-CM | POA: Diagnosis not present

## 2016-12-21 DIAGNOSIS — I129 Hypertensive chronic kidney disease with stage 1 through stage 4 chronic kidney disease, or unspecified chronic kidney disease: Secondary | ICD-10-CM

## 2016-12-21 DIAGNOSIS — R4182 Altered mental status, unspecified: Secondary | ICD-10-CM | POA: Diagnosis not present

## 2016-12-21 DIAGNOSIS — E1122 Type 2 diabetes mellitus with diabetic chronic kidney disease: Secondary | ICD-10-CM | POA: Diagnosis present

## 2016-12-21 DIAGNOSIS — F0390 Unspecified dementia without behavioral disturbance: Secondary | ICD-10-CM

## 2016-12-21 DIAGNOSIS — I5022 Chronic systolic (congestive) heart failure: Secondary | ICD-10-CM | POA: Diagnosis present

## 2016-12-21 DIAGNOSIS — N19 Unspecified kidney failure: Secondary | ICD-10-CM

## 2016-12-21 DIAGNOSIS — J9811 Atelectasis: Secondary | ICD-10-CM | POA: Diagnosis present

## 2016-12-21 DIAGNOSIS — R062 Wheezing: Secondary | ICD-10-CM | POA: Diagnosis not present

## 2016-12-21 DIAGNOSIS — IMO0001 Reserved for inherently not codable concepts without codable children: Secondary | ICD-10-CM

## 2016-12-21 DIAGNOSIS — E0590 Thyrotoxicosis, unspecified without thyrotoxic crisis or storm: Secondary | ICD-10-CM | POA: Diagnosis not present

## 2016-12-21 DIAGNOSIS — I130 Hypertensive heart and chronic kidney disease with heart failure and stage 1 through stage 4 chronic kidney disease, or unspecified chronic kidney disease: Secondary | ICD-10-CM | POA: Diagnosis present

## 2016-12-21 DIAGNOSIS — Z8673 Personal history of transient ischemic attack (TIA), and cerebral infarction without residual deficits: Secondary | ICD-10-CM | POA: Diagnosis not present

## 2016-12-21 DIAGNOSIS — I5043 Acute on chronic combined systolic (congestive) and diastolic (congestive) heart failure: Secondary | ICD-10-CM | POA: Diagnosis present

## 2016-12-21 LAB — BASIC METABOLIC PANEL
ANION GAP: 11 (ref 5–15)
BUN: 50 mg/dL — AB (ref 6–20)
CHLORIDE: 106 mmol/L (ref 101–111)
CO2: 19 mmol/L — AB (ref 22–32)
Calcium: 9.6 mg/dL (ref 8.9–10.3)
Creatinine, Ser: 1.71 mg/dL — ABNORMAL HIGH (ref 0.44–1.00)
GFR calc Af Amer: 35 mL/min — ABNORMAL LOW (ref >60)
GFR calc non Af Amer: 30 mL/min — ABNORMAL LOW (ref >60)
GLUCOSE: 101 mg/dL — AB (ref 65–99)
Potassium: 4.6 mmol/L (ref 3.5–5.1)
Sodium: 136 mmol/L (ref 135–145)

## 2016-12-21 LAB — CBC WITH DIFFERENTIAL/PLATELET
BASOS PCT: 0 %
Basophils Absolute: 0 10*3/uL (ref 0.0–0.1)
EOS ABS: 0 10*3/uL (ref 0.0–0.7)
Eosinophils Relative: 0 %
HCT: 28.6 % — ABNORMAL LOW (ref 36.0–46.0)
HEMOGLOBIN: 9.2 g/dL — AB (ref 12.0–15.0)
Lymphocytes Relative: 11 %
Lymphs Abs: 1.3 10*3/uL (ref 0.7–4.0)
MCH: 26.7 pg (ref 26.0–34.0)
MCHC: 32.2 g/dL (ref 30.0–36.0)
MCV: 83.1 fL (ref 78.0–100.0)
Monocytes Absolute: 0.4 10*3/uL (ref 0.1–1.0)
Monocytes Relative: 3 %
NEUTROS PCT: 86 %
Neutro Abs: 9.4 10*3/uL — ABNORMAL HIGH (ref 1.7–7.7)
Platelets: 211 10*3/uL (ref 150–400)
RBC: 3.44 MIL/uL — AB (ref 3.87–5.11)
RDW: 14.5 % (ref 11.5–15.5)
WBC: 11.1 10*3/uL — AB (ref 4.0–10.5)

## 2016-12-21 LAB — BRAIN NATRIURETIC PEPTIDE: B Natriuretic Peptide: 244.3 pg/mL — ABNORMAL HIGH (ref 0.0–100.0)

## 2016-12-21 LAB — I-STAT TROPONIN, ED: Troponin i, poc: 0.08 ng/mL (ref 0.00–0.08)

## 2016-12-21 LAB — I-STAT CG4 LACTIC ACID, ED: Lactic Acid, Venous: 0.83 mmol/L (ref 0.5–1.9)

## 2016-12-21 LAB — GLUCOSE, CAPILLARY: GLUCOSE-CAPILLARY: 221 mg/dL — AB (ref 65–99)

## 2016-12-21 MED ORDER — SODIUM CHLORIDE 0.9% FLUSH
3.0000 mL | Freq: Two times a day (BID) | INTRAVENOUS | Status: DC
  Administered 2016-12-21 – 2016-12-24 (×5): 3 mL via INTRAVENOUS

## 2016-12-21 MED ORDER — ONDANSETRON HCL 4 MG PO TABS: 4.0000 mg | ORAL_TABLET | Freq: Four times a day (QID) | ORAL | Status: DC | PRN

## 2016-12-21 MED ORDER — FAMOTIDINE 20 MG PO TABS
20.0000 mg | ORAL_TABLET | Freq: Every day | ORAL | Status: DC
  Administered 2016-12-21: 20 mg
  Filled 2016-12-21 (×2): qty 1

## 2016-12-21 MED ORDER — DEXTROSE 5 % IV SOLN
2.0000 g | INTRAVENOUS | Status: AC
  Administered 2016-12-22 – 2016-12-23 (×2): 2 g via INTRAVENOUS
  Filled 2016-12-21 (×2): qty 2

## 2016-12-21 MED ORDER — MELATONIN 3 MG PO TABS
3.0000 mg | ORAL_TABLET | Freq: Every day | ORAL | Status: DC
  Administered 2016-12-21: 3 mg
  Filled 2016-12-21: qty 1

## 2016-12-21 MED ORDER — ACETAMINOPHEN 325 MG PO TABS
650.0000 mg | ORAL_TABLET | Freq: Four times a day (QID) | ORAL | Status: DC | PRN
  Administered 2016-12-23: 650 mg via ORAL
  Filled 2016-12-21: qty 2

## 2016-12-21 MED ORDER — VANCOMYCIN HCL 10 G IV SOLR
1250.0000 mg | INTRAVENOUS | Status: DC
  Filled 2016-12-21: qty 1250

## 2016-12-21 MED ORDER — SODIUM BICARBONATE 650 MG PO TABS
650.0000 mg | ORAL_TABLET | Freq: Two times a day (BID) | ORAL | Status: DC
  Administered 2016-12-21 – 2016-12-24 (×6): 650 mg via ORAL
  Filled 2016-12-21 (×6): qty 1

## 2016-12-21 MED ORDER — ATORVASTATIN CALCIUM 40 MG PO TABS: 40.0000 mg | ORAL_TABLET | Freq: Every evening | ORAL | Status: DC

## 2016-12-21 MED ORDER — ASPIRIN 81 MG PO CHEW
81.0000 mg | CHEWABLE_TABLET | Freq: Every day | ORAL | Status: DC
  Administered 2016-12-21 – 2016-12-24 (×4): 81 mg via ORAL
  Filled 2016-12-21 (×4): qty 1

## 2016-12-21 MED ORDER — INSULIN GLARGINE 100 UNIT/ML ~~LOC~~ SOLN
15.0000 [IU] | Freq: Every day | SUBCUTANEOUS | Status: DC
  Administered 2016-12-21 – 2016-12-22 (×2): 15 [IU] via SUBCUTANEOUS
  Filled 2016-12-21 (×3): qty 0.15

## 2016-12-21 MED ORDER — VITAMIN D 1000 UNITS PO TABS
1000.0000 [IU] | ORAL_TABLET | Freq: Every day | ORAL | Status: DC
  Administered 2016-12-22 – 2016-12-24 (×3): 1000 [IU] via ORAL
  Filled 2016-12-21 (×3): qty 1

## 2016-12-21 MED ORDER — ENOXAPARIN SODIUM 60 MG/0.6ML ~~LOC~~ SOLN
0.5000 mg/kg | SUBCUTANEOUS | Status: DC
  Administered 2016-12-21 – 2016-12-22 (×2): 50 mg via SUBCUTANEOUS
  Filled 2016-12-21 (×2): qty 0.6

## 2016-12-21 MED ORDER — ACETAMINOPHEN 500 MG PO TABS: 500.0000 mg | ORAL_TABLET | Freq: Four times a day (QID) | ORAL | Status: DC | PRN

## 2016-12-21 MED ORDER — DEXTROSE 5 % IV SOLN
2.0000 g | Freq: Once | INTRAVENOUS | Status: AC
  Administered 2016-12-21: 2 g via INTRAVENOUS
  Filled 2016-12-21: qty 2

## 2016-12-21 MED ORDER — ALBUTEROL SULFATE (2.5 MG/3ML) 0.083% IN NEBU
2.5000 mg | INHALATION_SOLUTION | Freq: Four times a day (QID) | RESPIRATORY_TRACT | Status: DC
  Administered 2016-12-21 – 2016-12-22 (×3): 2.5 mg via RESPIRATORY_TRACT
  Filled 2016-12-21 (×3): qty 3

## 2016-12-21 MED ORDER — IPRATROPIUM BROMIDE 0.02 % IN SOLN
0.5000 mg | Freq: Four times a day (QID) | RESPIRATORY_TRACT | Status: DC
  Administered 2016-12-21 – 2016-12-22 (×3): 0.5 mg via RESPIRATORY_TRACT
  Filled 2016-12-21 (×3): qty 2.5

## 2016-12-21 MED ORDER — FERROUS SULFATE 325 (65 FE) MG PO TABS
325.0000 mg | ORAL_TABLET | Freq: Two times a day (BID) | ORAL | Status: DC
  Administered 2016-12-22 – 2016-12-24 (×5): 325 mg via ORAL
  Filled 2016-12-21 (×5): qty 1

## 2016-12-21 MED ORDER — SODIUM CHLORIDE 0.9% FLUSH
3.0000 mL | INTRAVENOUS | Status: DC | PRN
Start: 2016-12-21 — End: 2016-12-24

## 2016-12-21 MED ORDER — ACETAMINOPHEN 650 MG RE SUPP: 650.0000 mg | Freq: Four times a day (QID) | RECTAL | Status: DC | PRN

## 2016-12-21 MED ORDER — BUDESONIDE 0.5 MG/2ML IN SUSP
0.5000 mg | Freq: Two times a day (BID) | RESPIRATORY_TRACT | Status: DC
  Administered 2016-12-21 – 2016-12-24 (×6): 0.5 mg via RESPIRATORY_TRACT
  Filled 2016-12-21 (×6): qty 2

## 2016-12-21 MED ORDER — IPRATROPIUM BROMIDE 0.02 % IN SOLN
0.5000 mg | Freq: Once | RESPIRATORY_TRACT | Status: AC
  Administered 2016-12-21: 0.5 mg via RESPIRATORY_TRACT
  Filled 2016-12-21: qty 2.5

## 2016-12-21 MED ORDER — DOCUSATE SODIUM 100 MG PO CAPS
100.0000 mg | ORAL_CAPSULE | Freq: Every day | ORAL | Status: DC
  Administered 2016-12-22 – 2016-12-24 (×3): 100 mg via ORAL
  Filled 2016-12-21 (×3): qty 1

## 2016-12-21 MED ORDER — ONDANSETRON HCL 4 MG/2ML IJ SOLN: 4.0000 mg | Freq: Four times a day (QID) | INTRAMUSCULAR | Status: DC | PRN

## 2016-12-21 MED ORDER — SODIUM CHLORIDE 0.9 % IV SOLN: 250.0000 mL | INTRAVENOUS | Status: DC | PRN

## 2016-12-21 MED ORDER — CRANBERRY 200 MG PO CAPS: 1.0000 | ORAL_CAPSULE | Freq: Two times a day (BID) | ORAL | Status: DC

## 2016-12-21 MED ORDER — RENA-VITE PO TABS
1.0000 | ORAL_TABLET | Freq: Every day | ORAL | Status: DC
  Administered 2016-12-21 – 2016-12-23 (×3): 1 via ORAL
  Filled 2016-12-21 (×3): qty 1

## 2016-12-21 MED ORDER — SACCHAROMYCES BOULARDII 250 MG PO CAPS
250.0000 mg | ORAL_CAPSULE | Freq: Two times a day (BID) | ORAL | Status: DC
  Administered 2016-12-21 – 2016-12-24 (×6): 250 mg via ORAL
  Filled 2016-12-21 (×6): qty 1

## 2016-12-21 MED ORDER — MEMANTINE HCL 10 MG PO TABS
10.0000 mg | ORAL_TABLET | Freq: Two times a day (BID) | ORAL | Status: DC
  Administered 2016-12-21 – 2016-12-24 (×6): 10 mg via ORAL
  Filled 2016-12-21 (×6): qty 1

## 2016-12-21 MED ORDER — INSULIN ASPART 100 UNIT/ML ~~LOC~~ SOLN
0.0000 [IU] | Freq: Three times a day (TID) | SUBCUTANEOUS | Status: DC
  Administered 2016-12-22 (×2): 1 [IU] via SUBCUTANEOUS
  Administered 2016-12-23 – 2016-12-24 (×2): 2 [IU] via SUBCUTANEOUS

## 2016-12-21 MED ORDER — METOPROLOL TARTRATE 50 MG PO TABS
50.0000 mg | ORAL_TABLET | Freq: Two times a day (BID) | ORAL | Status: DC
  Administered 2016-12-21 – 2016-12-24 (×6): 50 mg via ORAL
  Filled 2016-12-21 (×6): qty 1

## 2016-12-21 MED ORDER — METHIMAZOLE 10 MG PO TABS
10.0000 mg | ORAL_TABLET | Freq: Every day | ORAL | Status: DC
  Administered 2016-12-22 – 2016-12-24 (×2): 10 mg
  Filled 2016-12-21 (×3): qty 1

## 2016-12-21 MED ORDER — BISACODYL 10 MG RE SUPP: 10.0000 mg | RECTAL | Status: DC | PRN

## 2016-12-21 MED ORDER — VANCOMYCIN HCL IN DEXTROSE 1-5 GM/200ML-% IV SOLN: 1000.0000 mg | Freq: Once | INTRAVENOUS | Status: DC

## 2016-12-21 MED ORDER — MUSCLE RUB 10-15 % EX CREA
TOPICAL_CREAM | Freq: Two times a day (BID) | CUTANEOUS | Status: DC | PRN
  Filled 2016-12-21: qty 85

## 2016-12-21 MED ORDER — VANCOMYCIN HCL 10 G IV SOLR
2000.0000 mg | Freq: Once | INTRAVENOUS | Status: AC
  Administered 2016-12-21: 2000 mg via INTRAVENOUS
  Filled 2016-12-21: qty 2000

## 2016-12-21 MED ORDER — ALBUTEROL SULFATE (2.5 MG/3ML) 0.083% IN NEBU
5.0000 mg | INHALATION_SOLUTION | Freq: Once | RESPIRATORY_TRACT | Status: AC
  Administered 2016-12-21: 5 mg via RESPIRATORY_TRACT
  Filled 2016-12-21: qty 6

## 2016-12-21 NOTE — ED Notes (Signed)
Report given.

## 2016-12-21 NOTE — ED Triage Notes (Signed)
Pt. Coming from [place] via [facility] for wheezing per facility. EMS reports clear lung sounds bilaterally. Pt. Chronic respiratory issues + asthma and took a duoneb immediately prior to transport. Pt. Hx of anxiety and dementia as well. Pt. On 2L oxygen via [place] all the time. Pt. Vitals 010 systolic palpated, 88 HR, and 98% on 2L oxygen.

## 2016-12-21 NOTE — ED Provider Notes (Signed)
[name] DEPT Provider Note   CSN: [id number] Arrival date & time: 12/21/16  1216     History   Chief Complaint Chief Complaint  Patient presents with  . Shortness of Breath    HPI Angelica Beck is a 66 y.o. female. Patient presents from nursing facility. History is limited by dementia. Patient presents with SOB. Denies chest pain, abdominal pain, leg pain, palpitations. Per progress note from earlier this morning, patient reports choking on an M&M 2 nights ago. Since then she has been experiencing cough and dyspnea. Portable CXR done yesterday showed patchy infiltrate in LLL. She began taking Avelox, nebs q6hrs, prednisone and 2L of O2 via [place]. Despite this, she is still c/o dyspnea. Oxygen sats have remained in high 90s. She has a history of tracheostomy insertion on 05/2016 but this was removed in 09/2016.     Past Medical History:  Diagnosis Date  . Arthritis   . Asthma    09/07/16  . Diabetes mellitus, type 2 ([place])   . Hyperlipidemia   . Hypertension   . Hyperthyroidism   . Obesity hypoventilation syndrome ([place]) 06/20/2013  . Stroke [facility])     Patient Active Problem List   Diagnosis Date Noted  . PNA (pneumonia) 12/21/2016  . Edema 11/02/2016  . Diastolic dysfunction 37/06/6268  . Osteoarthritis of both knees 01-27-2017  . At risk for aspiration 10/03/2016  . Normocytic normochromic anemia 09/07/2016  . Acute on chronic respiratory failure ([place])   . (HFpEF) heart failure with preserved ejection fraction ([place])   . Enteritis due to Clostridium difficile   . SBO (small bowel obstruction) 08/10/2016  . Collapse of left lung   . S/P percutaneous endoscopic gastrostomy (PEG) tube placement ([place])   . Tracheostomy status ([place])   . HCAP (healthcare-associated pneumonia) 07/16/2016  . Encephalopathy   . Palliative care encounter   . Pressure injury of skin 06/24/2016  . Acute respiratory failure ([place]) 06/21/2016  . Septic shock ([place]) 06/20/2016  . Chronic  respiratory failure with hypoxia ([place]) 11/29/2015  . Essential hypertension   . Lactic acidosis 08/02/2015  . History of CVA (cerebrovascular accident) 08/02/2015  . Chronic systolic HF (heart failure) ([place]) 08/02/2015  . Cognitive impairment 08/02/2015  . Abnormality of gait 02/08/2015  . Hyperthyroidism 02/08/2015  . Obesity hypoventilation syndrome ([place]) 06/20/2013  . Hyperglycemia 06/28/2009  . HYPERLIPIDEMIA-MIXED 06/28/2009  . HYPERTENSION, BENIGN 06/28/2009  . CAD, NATIVE VESSEL 06/28/2009  . DYSPNEA 06/28/2009    Past Surgical History:  Procedure Laterality Date  . IR GENERIC HISTORICAL  07/12/2016   IR US GUIDE VASC ACCESS RIGHT 07/12/2016 [name], MD [facility]  . IR GENERIC HISTORICAL  07/12/2016   IR FLUORO GUIDE CV LINE RIGHT 07/12/2016 [name], MD [facility]  . IR GENERIC HISTORICAL  07/17/2016   IR GASTROSTOMY TUBE MOD SED 07/17/2016 [name], MD [facility]  . IR GENERIC HISTORICAL  08/23/2016   IR REMOVAL TUN CV CATH W/O FL 08/23/2016 [name], MD [facility]  . IR GENERIC HISTORICAL  11/09/2016   IR GASTROSTOMY TUBE REMOVAL 11/09/2016 [name], PA-C [facility]  . TRACHEOSTOMY TUBE PLACEMENT N/A 07/14/2016   Procedure: TRACHEOSTOMY, THYROID ISTHMUSECTOMY;  Surgeon: [name], MD;  [facility];  Service: ENT;  Laterality: N/A;  . VENTRAL HERNIA REPAIR      OB History    No data available       Home Medications    Prior to Admission medications   Medication Sig Start Date  End Date Taking? Authorizing Provider  acetaminophen (TYLENOL) 500 MG tablet Take 500 mg by mouth every 6 (six) hours as needed.    Historical Provider, MD  amLODipine (NORVASC) 10 MG tablet Take 10 mg by mouth daily.    Historical Provider, MD  aspirin 81 MG chewable tablet Chew 81 mg by mouth daily.    Historical Provider, MD  atorvastatin (LIPITOR) 40 MG tablet Place 40 mg into feeding tube every evening.     Historical Provider, MD  b  complex-vitamin c-folic acid (NEPHRO-VITE) 0.8 MG TABS tablet Take 1 tablet by mouth every morning.    Historical Provider, MD  bisacodyl (DULCOLAX) 10 MG suppository Place 10 mg rectally as needed for moderate constipation.    Historical Provider, MD  budesonide (PULMICORT) 0.5 MG/2ML nebulizer solution Take 0.5 mg by nebulization 2 (two) times daily.    Historical Provider, MD  cholecalciferol (VITAMIN D) 1000 units tablet Take 1,000 Units by mouth daily.    Historical Provider, MD  Cranberry 200 MG CAPS Take 1 capsule by mouth 2 (two) times daily.    Historical Provider, MD  docusate sodium (COLACE) 100 MG capsule Take 100 mg by mouth daily.    Historical Provider, MD  famotidine (PEPCID) 20 MG tablet Place 1 tablet (20 mg total) into feeding tube daily. 07/19/16   [name], NP  ferrous sulfate 325 (65 FE) MG tablet Take 325 mg by mouth 2 (two) times daily with a meal.    Historical Provider, MD  furosemide (LASIX) 20 MG tablet Take 20 mg by mouth.    Historical Provider, MD  insulin glargine (LANTUS) 100 UNIT/ML injection Inject 20 Units into the skin at bedtime.     Historical Provider, MD  ipratropium-albuterol (DUONEB) 0.5-2.5 (3) MG/3ML SOLN Take 3 mLs by nebulization every 4 (four) hours as needed (for dyspnea).    Historical Provider, MD  Melatonin 3 MG TABS Place 3 mg into feeding tube at bedtime.    Historical Provider, MD  meloxicam (MOBIC) 7.5 MG tablet Take 1 tablet (7.5 mg total) by mouth 2 (two) times daily as needed for pain. 11/28/16   [name], MD  memantine (NAMENDA) 10 MG tablet Take 10 mg by mouth 2 (two) times daily.     Historical Provider, MD  methimazole (TAPAZOLE) 10 MG tablet Place 1 tablet (10 mg total) into feeding tube daily. 07/19/16   [name], NP  metoprolol (LOPRESSOR) 50 MG tablet Take 50 mg by mouth 2 (two) times daily.    Historical Provider, MD  ondansetron (ZOFRAN) 4 MG tablet Take 4 mg by mouth every 8 (eight) hours as needed for nausea or  vomiting.    Historical Provider, MD  saccharomyces boulardii (FLORASTOR) 250 MG capsule Take 250 mg by mouth daily as needed. For loose stool    Historical Provider, MD  Sodium Phosphates (RA SALINE ENEMA RE) Place 1 each rectally as needed (for constipation).    Historical Provider, MD  trolamine salicylate (ASPERCREME) 10 % cream Apply to knees twice daily for pain    Historical Provider, MD    Family History Family History  Problem Relation Age of Onset  . Diabetes Mother   . Cancer Neg Hx   . Heart disease Neg Hx   . Stroke Neg Hx     Social History Social History  Substance Use Topics  . Smoking status: Former Smoker    Packs/day: 0.25    Years: 30.00    Types: Cigarettes  Quit date: 09/25/2002  . Smokeless tobacco: Never Used  . Alcohol use No     Allergies   Penicillins and Lactose intolerance (gi)   Review of Systems Review of Systems  Constitutional: Negative for appetite change, chills and fever.  HENT: Negative for rhinorrhea, sneezing and sore throat.   Eyes: Negative for photophobia and visual disturbance.  Respiratory: Positive for shortness of breath and wheezing. Negative for cough and chest tightness.   Cardiovascular: Negative for chest pain and palpitations.  Gastrointestinal: Negative for abdominal pain, constipation, diarrhea, nausea and vomiting.  Genitourinary: Negative for dysuria.  Musculoskeletal: Negative for myalgias.  Skin: Negative for rash.  Neurological: Negative for dizziness, weakness and light-headedness.     Physical Exam Updated Vital Signs BP 138/65   Pulse 66   Temp 98.5 F (36.9 C) (Oral)   Resp 12   Ht '5\' 6"'$  (1.676 m)   Wt 95.7 kg   SpO2 98%   BMI 34.06 kg/m   Physical Exam  Constitutional: She appears well-developed and well-nourished. No distress.  HENT:  Head: Normocephalic and atraumatic.  Nose: Nose normal.  Eyes: Conjunctivae and EOM are normal. Left eye exhibits no discharge. No scleral icterus.  Neck:  Normal range of motion. Neck supple.  Cardiovascular: Normal rate, regular rhythm, normal heart sounds and intact distal pulses.  Exam reveals no gallop and no friction rub.   No murmur heard. Pulmonary/Chest: Effort normal. No respiratory distress. She has decreased breath sounds in the left lower field. She has no wheezes. She has no rhonchi. She has no rales.  Abdominal: Soft. Bowel sounds are normal. She exhibits no distension. There is no tenderness. There is no guarding.  Musculoskeletal: Normal range of motion. She exhibits no edema.  Neurological: She is alert. She exhibits normal muscle tone. Coordination normal.  Skin: Skin is warm and dry. No rash noted.  Psychiatric: She has a normal mood and affect.  Nursing note and vitals reviewed.    ED Treatments / Results  Labs (all labs ordered are listed, but only abnormal results are displayed) Labs Reviewed  CBC WITH DIFFERENTIAL/PLATELET - Abnormal; Notable for the following:       Result Value   WBC 11.1 (*)    RBC 3.44 (*)    Hemoglobin 9.2 (*)    HCT 28.6 (*)    Neutro Abs 9.4 (*)    All other components within normal limits  BASIC METABOLIC PANEL - Abnormal; Notable for the following:    CO2 19 (*)    Glucose, Bld 101 (*)    BUN 50 (*)    Creatinine, Ser 1.71 (*)    GFR calc non Af Amer 30 (*)    GFR calc Af Amer 35 (*)    All other components within normal limits  BRAIN NATRIURETIC PEPTIDE - Abnormal; Notable for the following:    B Natriuretic Peptide 244.3 (*)    All other components within normal limits  CULTURE, BLOOD (ROUTINE X 2)  CULTURE, BLOOD (ROUTINE X 2)  I-STAT TROPOININ, ED  I-STAT CG4 LACTIC ACID, ED    EKG  EKG Interpretation  Date/Time:  Thursday December 21 2016 12:29:28 EDT Ventricular Rate:  66 PR Interval:    QRS Duration: 106 QT Interval:  466 QTC Calculation: 489 R Axis:   13 Text Interpretation:  Sinus rhythm Low voltage, precordial leads Borderline prolonged QT interval Confirmed by  Ashok Cordia  MD, Lennette Bihari ([id number]) on 12/21/2016 2:04:30 PM       Radiology  Dg Chest 2 View  Result Date: 12/21/2016 CLINICAL DATA:  Shortness breath.  Dyspnea. EXAM: CHEST  2 VIEW COMPARISON:  One-view chest x-ray 10/01/2016. FINDINGS: The heart size is exaggerated by low lung volumes. And mild diffuse interstitial pattern is present. No significant airspace consolidation is evident. Mild left basilar airspace disease likely reflects atelectasis. The tracheostomy tube has been removed. The visualized soft tissues and bony thorax are unremarkable. IMPRESSION: 1. Low lung volumes and mild diffuse interstitial pattern suggesting edema. 2. Asymmetric left basilar airspace disease likely reflects atelectasis. Infection is not excluded. Electronically Signed   By: San Morelle M.D.   On: 12/21/2016 13:40    Procedures Procedures (including critical care time)  Medications Ordered in ED Medications  albuterol (PROVENTIL) (2.5 MG/3ML) 0.083% nebulizer solution 5 mg (not administered)  ipratropium (ATROVENT) nebulizer solution 0.5 mg (not administered)  ceFEPIme (MAXIPIME) 2 g in dextrose 5 % 50 mL IVPB (not administered)  vancomycin (VANCOCIN) 2,000 mg in sodium chloride 0.9 % 500 mL IVPB (not administered)     Initial Impression / Assessment and Plan / ED Course  I have reviewed the triage vital signs and the nursing notes.  Pertinent labs & imaging results that were available during my care of the patient were reviewed by me and considered in my medical decision making (see chart for details).     Patient's history and symptoms concerning for PNA vs. CHF exacerbation vs. asthma exacerbation vs. AKI. No improvement in symptoms with Avelox, nebs q6hrs, prednisone or O2 since yesterday. CBC showed mild leukocytosis at 11.1. BMP showed possible AKI with increase in BUN and Cr from one month ago. BNP was elevated at 244. Unable to get good history of symptoms due to dementia. Patient did not  c/o fever, and provider note from earlier does not mention fever either. Patient will likely need to be admitted for further evaluation and symptom management.    Final Clinical Impressions(s) / ED Diagnoses   Final diagnoses:  Wheezing  History of asthma  Shortness of breath  HCAP (healthcare-associated pneumonia)  AKI (acute kidney injury) [facility])    New Prescriptions New Prescriptions   No medications on file     [place], [place] 12/21/16 [place], MD 12/21/16 [phone]

## 2016-12-21 NOTE — Progress Notes (Signed)
Attempted to call ED back to get report. They said they were unable to get in touch with that nurse.

## 2016-12-21 NOTE — Patient Instructions (Signed)
Patient will be transferred to the emergency room for evaluation of the acute clinical picture. 

## 2016-12-21 NOTE — Progress Notes (Signed)
Pharmacy Antibiotic Note  Angelica Beck is a 66 y.o. female admitted on 12/21/2016 with pneumonia.  Pharmacy has been consulted for vancomycin and cefepime dosing. Pt is afebrile and WBC is slightly elevated at 11.1. SCr is elevated at 1.71. Lactic acid is <1.   Plan: Vancomycin 2gm IV x 1 then '1250mg'$  IV Q24H Cefepime 2gm IV Q24H F/u renal fxn, C&S, clinical status and trough at SS  Height: '5\' 6"'$  (167.6 cm) Weight: 211 lb (95.7 kg) IBW/kg (Calculated) : 59.3  Temp (24hrs), Avg:98.5 F (36.9 C), Min:98.5 F (36.9 C), Max:98.5 F (36.9 C)   Recent Labs Lab 12/21/16 1347 12/21/16 1626  WBC 11.1*  --   CREATININE 1.71*  --   LATICACIDVEN  --  0.83    Estimated Creatinine Clearance: 38.8 mL/min (A) (by C-G formula based on SCr of 1.71 mg/dL (H)).    Allergies  Allergen Reactions  . Penicillins Swelling    FACIAL SWELLING Has patient had a PCN reaction causing immediate rash, facial/tongue/throat swelling, SOB or lightheadedness with hypotension: Yes Has patient had a PCN reaction causing severe rash involving mucus membranes or skin necrosis: No Has patient had a PCN reaction that required hospitalization: No Has patient had a PCN reaction occurring within the last 10 years: No If all of the above answers are "NO", then may proceed with Cephalosporin use.   . Lactose Intolerance (Gi) Diarrhea and Nausea And Vomiting    Antimicrobials this admission: Vanc 3/29>> Cefepime 3/29>>  Dose adjustments this admission: N/A  Microbiology results: Pending  Thank you for allowing pharmacy to be a part of this patient's care.  Kanika Bungert, Rande Lawman 12/21/2016 5:01 PM

## 2016-12-21 NOTE — ED Notes (Signed)
Attempted report 

## 2016-12-21 NOTE — Progress Notes (Signed)
    This is a nursing facility follow up for specific acute issue of dyspnea with abnormal Xray.  Interim medical record and care since last [place] visit was updated with review of diagnostic studies and change in clinical status since last visit were documented.  HPI: Nursing requested assessment of dyspnea. The patient was eating M&M chocolates and choked on the candy 2 nights ago, 12/19/16. As of 3/28 she was coughing with dyspnea. X-ray reveals left mid to lower lung patchy infiltrate with obscuration of the left hemidiaphragm. Portable films 3/28  by [place]   were personally reviewed and compared to prior [facility] films. She was started on Avelox , neb treatments every 6 hours,O2 and prednisone 3/28 but continues to have dyspnea despite excellent O2 sats. Significantly she has a history of tracheostomy which had been inserted in September 2017 during hospitalization for  episode of septic shock, AKI, and prolonged ventilator dependence. This was superimposed on a prior stroke. She was readmitted November 17 for small bowel obstruction and C. difficile. She had been to the emergency room 5 for trach dislodgment, actually the patient was removing it. Based on evaluation at the trach clinic 10/04/16 it was removed.   Review of systems: Dementia invalidated responses.  Patient exhibited difficulty comprehending questions.  Admits to difficulty breathing when questioned by the nurse, but denies S1 asked later.   Physical exam: Alert to self only.   Pertinent or positive findings:  Patient appears anxious with increased work of breathing and tachypnea. Mastication chewing activity present Nasal cannula in place since yesterday.  Symmetric thyromegaly present without nodular densities.  Heart sounds distant but regular. S4 gallop &  tachycardia present. Lung sounds diminished in bases, Rhonchi bilaterally. Rales left anterior chest. Bilateral lower  extremity edema 1+  Decreased pedal pulses.  Skin is hot but are thermometer still does not measure any elevation of temperature.  General appearance:Adequately nourished Lymphatic: No lymphadenopathy about the head, neck, axilla . Eyes: No conjunctival inflammation or lid edema is present. There is no scleral icterus. Ears:  External ear exam shows no significant lesions or deformities.   Nose:  External nasal examination shows no deformity or inflammation. Nasal mucosa are pink and moist without lesions ,exudates Oral exam: lips and gums are healthy appearing.There is no oropharyngeal erythema or exudate . Neck:  No masses, tenderness noted.    Abdomen:Bowel sounds are normal. Abdomen is soft and nontender with no organomegaly, hernias,masses. GU: deferred  Extremities:  No cyanosis, clubbing  Neurologic exam : Strength equal  in upper & lower extremities Balance,Rhomberg,finger to nose testing could not be completed due to clinical state No significant lesions or rash.  #1 left mid and lower lung filtrate with possible aspiration etiology ; suboptimal response clinically to aggressive therapy at the SNF. #2 multiple comorbidities, most significant is history of tracheostomy and ventilator dependence previously.due to frank respiratory failure. #3 dementia which hinders her ability to participate in her care and comply with recommendations, specifically pulmonary toilet and aspiration prevention maneuvers  Patient will be transferred to the emergency room for evaluation of the acute clinical picture.

## 2016-12-21 NOTE — H&P (Signed)
History and Physical    Angelica Beck EZM:[id number] DOB: [birth year] DOA: 12/21/2016  PCP: Delia Chimes, NP (Inactive)  Patient coming from: SNF  I have personally briefly reviewed patient's old medical records in [place]  Chief Complaint: SOB  HPI: Angelica Beck is a 66 y.o. female with medical history significant of DM, Asthma, HTN, Obesity hypoventilation syndrome, prior stroke who present from SNF with complaint of SOB. Patient is poor historian. She relates SOB that started today. She denies chest pain or cough, abdominal pain. She doesn't know when was her last BM/  Reviewing records patient was seen by SNF physician, 3-28 for cough and SOB. She was diagnosed with PNA. She was started on avelox. She continue to have dyspnea. She was refer to ED for further evaluation.   ED Course: WBC at 11, Hb at 9, Cr 1.7 increase from baseline at 1.3. Lactic acid at 0.83. Chest x ray: Low lung volumes and mild diffuse interstitial pattern suggesting edema. Asymmetric left basilar airspace disease likely reflects atelectasis. Infection is not excluded. BNP 244, troponin 0.08   Review of Systems: As per HPI otherwise 10 point review of systems negative.    Past Medical History:  Diagnosis Date  . Arthritis   . Asthma    09/07/16  . Diabetes mellitus, type 2 ([place])   . Hyperlipidemia   . Hypertension   . Hyperthyroidism   . Obesity hypoventilation syndrome ([place]) 06/20/2013  . Stroke [facility])     Past Surgical History:  Procedure Laterality Date  . IR GENERIC HISTORICAL  07/12/2016   IR US GUIDE VASC ACCESS RIGHT 07/12/2016 [name], MD [facility]  . IR GENERIC HISTORICAL  07/12/2016   IR FLUORO GUIDE CV LINE RIGHT 07/12/2016 [name], MD [facility]  . IR GENERIC HISTORICAL  07/17/2016   IR GASTROSTOMY TUBE MOD SED 07/17/2016 [name], MD [facility]  . IR GENERIC HISTORICAL  08/23/2016   IR REMOVAL TUN CV CATH W/O FL 08/23/2016 [name], MD  [facility]  . IR GENERIC HISTORICAL  11/09/2016   IR GASTROSTOMY TUBE REMOVAL 11/09/2016 [name], PA-C [facility]  . TRACHEOSTOMY TUBE PLACEMENT N/A 07/14/2016   Procedure: TRACHEOSTOMY, THYROID ISTHMUSECTOMY;  Surgeon: [name], MD;  Location: [place];  Service: ENT;  Laterality: N/A;  . [place]       reports that she quit smoking about 14 years ago. Her smoking use included Cigarettes. She has a 7.50 pack-year smoking history. She has never used smokeless tobacco. She reports that she does not drink alcohol or use drugs.  Allergies  Allergen Reactions  . Penicillins Swelling    FACIAL SWELLING Has patient had a PCN reaction causing immediate rash, facial/tongue/throat swelling, SOB or lightheadedness with hypotension: Yes Has patient had a PCN reaction causing severe rash involving mucus membranes or skin necrosis: No Has patient had a PCN reaction that required hospitalization: No Has patient had a PCN reaction occurring within the last 10 years: No If all of the above answers are "NO", then may proceed with Cephalosporin use.   . Lactose Intolerance (Gi) Diarrhea and Nausea And Vomiting    Family History  Problem Relation Age of Onset  . Diabetes Mother   . Cancer Neg Hx   . Heart disease Neg Hx   . Stroke Neg Hx      Prior to Admission medications   Medication Sig Start Date End Date Taking? Authorizing Provider  acetaminophen (TYLENOL) 500 MG tablet Take 500  mg by mouth every 6 (six) hours as needed.    Historical Provider, MD  amLODipine (NORVASC) 10 MG tablet Take 10 mg by mouth daily.    Historical Provider, MD  aspirin 81 MG chewable tablet Chew 81 mg by mouth daily.    Historical Provider, MD  atorvastatin (LIPITOR) 40 MG tablet Place 40 mg into feeding tube every evening.     Historical Provider, MD  b complex-vitamin c-folic acid (NEPHRO-VITE) 0.8 MG TABS tablet Take 1 tablet by mouth every morning.    Historical Provider, MD  bisacodyl  (DULCOLAX) 10 MG suppository Place 10 mg rectally as needed for moderate constipation.    Historical Provider, MD  budesonide (PULMICORT) 0.5 MG/2ML nebulizer solution Take 0.5 mg by nebulization 2 (two) times daily.    Historical Provider, MD  cholecalciferol (VITAMIN D) 1000 units tablet Take 1,000 Units by mouth daily.    Historical Provider, MD  Cranberry 200 MG CAPS Take 1 capsule by mouth 2 (two) times daily.    Historical Provider, MD  docusate sodium (COLACE) 100 MG capsule Take 100 mg by mouth daily.    Historical Provider, MD  famotidine (PEPCID) 20 MG tablet Place 1 tablet (20 mg total) into feeding tube daily. 07/19/16   [name], NP  ferrous sulfate 325 (65 FE) MG tablet Take 325 mg by mouth 2 (two) times daily with a meal.    Historical Provider, MD  furosemide (LASIX) 20 MG tablet Take 20 mg by mouth.    Historical Provider, MD  insulin glargine (LANTUS) 100 UNIT/ML injection Inject 20 Units into the skin at bedtime.     Historical Provider, MD  ipratropium-albuterol (DUONEB) 0.5-2.5 (3) MG/3ML SOLN Take 3 mLs by nebulization every 4 (four) hours as needed (for dyspnea).    Historical Provider, MD  Melatonin 3 MG TABS Place 3 mg into feeding tube at bedtime.    Historical Provider, MD  meloxicam (MOBIC) 7.5 MG tablet Take 1 tablet (7.5 mg total) by mouth 2 (two) times daily as needed for pain. 11/28/16   [name], MD  memantine (NAMENDA) 10 MG tablet Take 10 mg by mouth 2 (two) times daily.     Historical Provider, MD  methimazole (TAPAZOLE) 10 MG tablet Place 1 tablet (10 mg total) into feeding tube daily. 07/19/16   [name], NP  metoprolol (LOPRESSOR) 50 MG tablet Take 50 mg by mouth 2 (two) times daily.    Historical Provider, MD  ondansetron (ZOFRAN) 4 MG tablet Take 4 mg by mouth every 8 (eight) hours as needed for nausea or vomiting.    Historical Provider, MD  saccharomyces boulardii (FLORASTOR) 250 MG capsule Take 250 mg by mouth daily as needed. For loose  stool    Historical Provider, MD  Sodium Phosphates (RA SALINE ENEMA RE) Place 1 each rectally as needed (for constipation).    Historical Provider, MD  trolamine salicylate (ASPERCREME) 10 % cream Apply to knees twice daily for pain    Historical Provider, MD    Physical Exam: Vitals:   12/21/16 1225 12/21/16 1239 12/21/16 1240  BP: 138/65    Pulse: 66    Resp: 12    Temp: 98.5 F (36.9 C)    TempSrc: Oral    SpO2: 100% 98%   Weight:   95.7 kg (211 lb)  Height:   '5\' 6"'$  (1.676 m)    Constitutional: NAD, calm, comfortable Vitals:   12/21/16 1225 12/21/16 1239 12/21/16 1240  BP: 138/65  Pulse: 66    Resp: 12    Temp: 98.5 F (36.9 C)    TempSrc: Oral    SpO2: 100% 98%   Weight:   95.7 kg (211 lb)  Height:   '5\' 6"'$  (1.676 m)   Eyes: PERRL, lids and conjunctivae normal, soft speech  ENMT: Mucous membranes are moist. Posterior pharynx clear of any exudate or lesions.Normal dentition.  Neck: normal, supple, no masses, no thyromegaly Respiratory:  Normal respiratory effort. No accessory muscle use. Bilateral ronchus, no wheezing.  Cardiovascular: Regular rate and rhythm, no murmurs / rubs / gallops.   2+ pedal pulses. No carotid bruits. Edema plus 1 Abdomen: no tenderness, no masses palpated. No hepatosplenomegaly. Bowel sounds positive.  Musculoskeletal: no clubbing / cyanosis. No joint deformity upper and lower extremities. Good ROM, no contractures. Normal muscle tone.  Skin: no rashes, lesions, ulcers. No induration Neurologic: CN 2-12 grossly intact. Sensation intact, DTR normal. Strength 5/5 in all 4.  Psychiatric: Alert and oriented x 3. Normal mood.    Labs on Admission: I have personally reviewed following labs and imaging studies  CBC:  Recent Labs Lab 12/21/16 1347  WBC 11.1*  NEUTROABS 9.4*  HGB 9.2*  HCT 28.6*  MCV 83.1  PLT 518   Basic Metabolic Panel:  Recent Labs Lab 12/21/16 1347  NA 136  K 4.6  CL 106  CO2 19*  GLUCOSE 101*  BUN 50*    CREATININE 1.71*  CALCIUM 9.6   GFR: Estimated Creatinine Clearance: 38.8 mL/min (A) (by C-G formula based on SCr of 1.71 mg/dL (H)). Liver Function Tests: No results for input(s): AST, ALT, ALKPHOS, BILITOT, PROT, ALBUMIN in the last 168 hours. No results for input(s): LIPASE, AMYLASE in the last 168 hours. No results for input(s): AMMONIA in the last 168 hours. Coagulation Profile: No results for input(s): INR, PROTIME in the last 168 hours. Cardiac Enzymes: No results for input(s): CKTOTAL, CKMB, CKMBINDEX, TROPONINI in the last 168 hours. BNP (last 3 results) No results for input(s): PROBNP in the last 8760 hours. HbA1C: No results for input(s): HGBA1C in the last 72 hours. CBG: No results for input(s): GLUCAP in the last 168 hours. Lipid Profile: No results for input(s): CHOL, HDL, LDLCALC, TRIG, CHOLHDL, LDLDIRECT in the last 72 hours. Thyroid Function Tests: No results for input(s): TSH, T4TOTAL, FREET4, T3FREE, THYROIDAB in the last 72 hours. Anemia Panel: No results for input(s): VITAMINB12, FOLATE, FERRITIN, TIBC, IRON, RETICCTPCT in the last 72 hours. Urine analysis:    Component Value Date/Time   COLORURINE YELLOW 08/12/2016 1538   APPEARANCEUR TURBID (A) 08/12/2016 1538   LABSPEC 1.021 08/12/2016 1538   PHURINE 5.5 08/12/2016 1538   GLUCOSEU NEGATIVE 08/12/2016 1538   HGBUR SMALL (A) 08/12/2016 1538   BILIRUBINUR NEGATIVE 08/12/2016 1538   KETONESUR NEGATIVE 08/12/2016 1538   PROTEINUR 30 (A) 08/12/2016 1538   UROBILINOGEN 0.2 08/02/2015 0935   NITRITE NEGATIVE 08/12/2016 1538   LEUKOCYTESUR SMALL (A) 08/12/2016 1538    Radiological Exams on Admission: Dg Chest 2 View  Result Date: 12/21/2016 CLINICAL DATA:  Shortness breath.  Dyspnea. EXAM: CHEST  2 VIEW COMPARISON:  One-view chest x-ray 10/01/2016. FINDINGS: The heart size is exaggerated by low lung volumes. And mild diffuse interstitial pattern is present. No significant airspace consolidation is  evident. Mild left basilar airspace disease likely reflects atelectasis. The tracheostomy tube has been removed. The visualized soft tissues and bony thorax are unremarkable. IMPRESSION: 1. Low lung volumes and mild diffuse interstitial pattern suggesting  edema. 2. Asymmetric left basilar airspace disease likely reflects atelectasis. Infection is not excluded. Electronically Signed   By: San Morelle M.D.   On: 12/21/2016 13:40    EKG: Independently reviewed. Sinus rhythm, low voltage criteria.   Assessment/Plan Active Problems:   HYPERTENSION, BENIGN   CAD, NATIVE VESSEL   Obesity hypoventilation syndrome (H[facility])   Hyperthyroidism   Chronic systolic HF (heart failure) (H[facility])   PNA (pneumonia)  1-PNA;  Presents with dyspnea, chest x ary with left lower lobe infiltrates, mild leukocytosis.  Admit to med-surgery.  Treat for health care associated, fail outpatient tx.  Blood culture. Strep pneumonia antigen ordered.   2-Acute hypoxic Respiratory failure.  In setting of PNA.  Continue with oxygen supplementation.  Nebulizer treatments.  If renal function improved , will consider start Lasix.   2-AKI; suspect related to infection, hypovolemia.  Hold lasix.  Repeat labs in am.  Hold meloxican.   3-DM; continue with lantus, lower home dose.  SSI.  4-hyperthyroidism; continue with methimazole.  5-HTN; hold Norvasc and lasix in setting of infection. If blood pressure increase could resume in am.  6-Dementia; continue with Namenda.    DVT prophylaxis: lovenox Code Status:  Full code.  Family Communication: none at bedside,  Disposition Plan: SNF in 2 to 72 hours.  Consults called: none Admission status: med surgery    Niel Hummer A MD [facility] Pager [phone]  If 7PM-7AM, please contact night-coverage [email or web address] Password [facility]  12/21/2016, 5:36 PM

## 2016-12-22 ENCOUNTER — Inpatient Hospital Stay (HOSPITAL_COMMUNITY): Payer: Medicare Other

## 2016-12-22 DIAGNOSIS — J189 Pneumonia, unspecified organism: Principal | ICD-10-CM

## 2016-12-22 LAB — CBC
HCT: 29.4 % — ABNORMAL LOW (ref 36.0–46.0)
Hemoglobin: 9.3 g/dL — ABNORMAL LOW (ref 12.0–15.0)
MCH: 26.3 pg (ref 26.0–34.0)
MCHC: 31.6 g/dL (ref 30.0–36.0)
MCV: 83.1 fL (ref 78.0–100.0)
PLATELETS: 210 10*3/uL (ref 150–400)
RBC: 3.54 MIL/uL — AB (ref 3.87–5.11)
RDW: 14.5 % (ref 11.5–15.5)
WBC: 11.8 10*3/uL — AB (ref 4.0–10.5)

## 2016-12-22 LAB — URINALYSIS, ROUTINE W REFLEX MICROSCOPIC
Bilirubin Urine: NEGATIVE
GLUCOSE, UA: NEGATIVE mg/dL
KETONES UR: NEGATIVE mg/dL
Nitrite: NEGATIVE
PROTEIN: NEGATIVE mg/dL
Specific Gravity, Urine: 1.011 (ref 1.005–1.030)
pH: 5 (ref 5.0–8.0)

## 2016-12-22 LAB — BASIC METABOLIC PANEL
ANION GAP: 13 (ref 5–15)
BUN: 55 mg/dL — ABNORMAL HIGH (ref 6–20)
CALCIUM: 9.9 mg/dL (ref 8.9–10.3)
CO2: 21 mmol/L — AB (ref 22–32)
CREATININE: 1.86 mg/dL — AB (ref 0.44–1.00)
Chloride: 102 mmol/L (ref 101–111)
GFR, EST AFRICAN AMERICAN: 32 mL/min — AB (ref >60)
GFR, EST NON AFRICAN AMERICAN: 28 mL/min — AB (ref >60)
Glucose, Bld: 125 mg/dL — ABNORMAL HIGH (ref 65–99)
Potassium: 5 mmol/L (ref 3.5–5.1)
SODIUM: 136 mmol/L (ref 135–145)

## 2016-12-22 LAB — GLUCOSE, CAPILLARY
GLUCOSE-CAPILLARY: 80 mg/dL (ref 65–99)
GLUCOSE-CAPILLARY: 110 mg/dL — AB (ref 65–99)
Glucose-Capillary: 128 mg/dL — ABNORMAL HIGH (ref 65–99)
Glucose-Capillary: 137 mg/dL — ABNORMAL HIGH (ref 65–99)

## 2016-12-22 LAB — HIV ANTIBODY (ROUTINE TESTING W REFLEX): HIV SCREEN 4TH GENERATION: NONREACTIVE

## 2016-12-22 LAB — STREP PNEUMONIAE URINARY ANTIGEN: STREP PNEUMO URINARY ANTIGEN: NEGATIVE

## 2016-12-22 LAB — MRSA PCR SCREENING: MRSA BY PCR: NEGATIVE

## 2016-12-22 MED ORDER — FUROSEMIDE 10 MG/ML IJ SOLN
40.0000 mg | Freq: Two times a day (BID) | INTRAMUSCULAR | Status: DC
  Administered 2016-12-22 – 2016-12-23 (×2): 40 mg via INTRAVENOUS
  Filled 2016-12-22 (×2): qty 4

## 2016-12-22 MED ORDER — ATORVASTATIN CALCIUM 40 MG PO TABS
40.0000 mg | ORAL_TABLET | Freq: Every evening | ORAL | Status: DC
  Administered 2016-12-22 – 2016-12-23 (×2): 40 mg via ORAL
  Filled 2016-12-22 (×2): qty 1

## 2016-12-22 MED ORDER — ALBUTEROL SULFATE (2.5 MG/3ML) 0.083% IN NEBU: 2.5000 mg | INHALATION_SOLUTION | RESPIRATORY_TRACT | Status: DC | PRN

## 2016-12-22 MED ORDER — ORAL CARE MOUTH RINSE
15.0000 mL | Freq: Two times a day (BID) | OROMUCOSAL | Status: DC
  Administered 2016-12-22 – 2016-12-24 (×4): 15 mL via OROMUCOSAL

## 2016-12-22 MED ORDER — IPRATROPIUM-ALBUTEROL 0.5-2.5 (3) MG/3ML IN SOLN
3.0000 mL | Freq: Two times a day (BID) | RESPIRATORY_TRACT | Status: DC
  Administered 2016-12-22 – 2016-12-24 (×4): 3 mL via RESPIRATORY_TRACT
  Filled 2016-12-22 (×4): qty 3

## 2016-12-22 MED ORDER — FAMOTIDINE 20 MG PO TABS
20.0000 mg | ORAL_TABLET | Freq: Every day | ORAL | Status: DC
  Administered 2016-12-22 – 2016-12-24 (×3): 20 mg via ORAL
  Filled 2016-12-22 (×2): qty 1

## 2016-12-22 MED ORDER — MELATONIN 3 MG PO TABS
3.0000 mg | ORAL_TABLET | Freq: Every day | ORAL | Status: DC
  Administered 2016-12-22 – 2016-12-23 (×2): 3 mg via ORAL
  Filled 2016-12-22 (×2): qty 1

## 2016-12-22 NOTE — Progress Notes (Signed)
Unable to obtain admission health history patient has dementia and no family at bedside.Will defer to next shift to obtain when family present.

## 2016-12-22 NOTE — Progress Notes (Signed)
Pt returned to unit accompanied by transport tech. Pt stable at this time, no complaints or issues currently.

## 2016-12-22 NOTE — Progress Notes (Signed)
Pt off unit for ultrasound, accompanied by transport tech. Pt stable at time of departure with no complaints.

## 2016-12-22 NOTE — Progress Notes (Signed)
PROGRESS NOTE    Angelica Beck  EUM:[id number] DOB: 08/15/1951 DOA: 12/21/2016 PCP: Delia Chimes, NP (Inactive)    Brief Narrative: Angelica Beck is a 66 y.o. female with medical history significant of DM, Asthma, HTN, Obesity hypoventilation syndrome, prior stroke who present from SNF with complaint of SOB. Patient is poor historian. She relates SOB that started today. She denies chest pain or cough, abdominal pain. She doesn't know when was her last BM/  Reviewing records patient was seen by SNF physician, 3-28 for cough and SOB. She was diagnosed with PNA. She was started on avelox. She continue to have dyspnea. She was refer to ED for further evaluation.   ED Course: WBC at 11, Hb at 9, Cr 1.7 increase from baseline at 1.3. Lactic acid at 0.83. Chest x ray: Low lung volumes and mild diffuse interstitial pattern suggesting edema. Asymmetric left basilar airspace disease likely reflects atelectasis. Infection is not excluded. BNP 244, troponin 0.08    Assessment & Plan:   Active Problems:   HYPERTENSION, BENIGN   CAD, NATIVE VESSEL   Obesity hypoventilation syndrome (HCC)   Hyperthyroidism   Chronic systolic HF (heart failure) (HCC)   PNA (pneumonia)   1-PNA;  Presents with dyspnea, chest x ary with left lower lobe infiltrates, mild leukocytosis.  Treat for health care associated, fail outpatient tx.  Blood culture,  Strep pneumonia antigen ordered and pending.  MRSA PCR negative, discontinue vancomycin.  Continue with cefepime.  Speech swallow evaluation.   2-Acute hypoxic Respiratory failure.  In setting of PNA and pulmonary edema, HF Continue with oxygen supplementation.  Nebulizer treatments.  Will start IV lasix 40 mg BID. Monitor renal function.    2-AKI; suspect related to infection, hypoperfusion from HF.  Hold meloxican.  Monitor on IV lasix.  Strict I and O. Will place foley catheter to be able to document output.  Check renal US  3-DM; continue with  lantus, lower home dose.  SSI.   4-Hyperthyroidism; continue with methimazole.   5-HTN; hold Norvasc  in setting of infection. If blood pressure increase could resume in am.   6-Dementia; continue with Namenda.   7-Acute on chronic diastolic HF;  Per daughter patient was off lasix for a while at rehab. Lasix was resume within the last month.  X ray with pulmonary edema also, LE with edma.  Start IV lasix 40 Mg IV BID. Monitor renal function.      DVT prophylaxis: Lovenox Code Status: full code.  Family Communication: care discussed with daughter. Please call daughter Angelica Mercury daily for updates.  Disposition Plan: remain in patient, start IV lasix.   Consultants:   none   Procedures:   none   Antimicrobials:   Cefepime 3-29  Received one dose of vancomycin.    Subjective: She is feeling better. Still complaining of SOB. Denies chest pain   Objective: Vitals:   12/22/16 0709 12/22/16 0736 12/22/16 1038 12/22/16 1308  BP: (!) 146/66  (!) 145/68 137/72  Pulse: 60  71 61  Resp: 18   16  Temp: 97.6 F (36.4 C)   98.4 F (36.9 C)  TempSrc: Oral     SpO2: 100% 100% 100% 100%  Weight: 97.9 kg (215 lb 14.4 oz)     Height:        Intake/Output Summary (Last 24 hours) at 12/22/16 1540 Last data filed at 12/22/16 0900  Gross per 24 hour  Intake  440 ml  Output                0 ml  Net              440 ml   Filed Weights   12/21/16 1240 12/22/16 0709  Weight: 95.7 kg (211 lb) 97.9 kg (215 lb 14.4 oz)    Examination:  General exam: Appears calm and comfortable  Respiratory system: Respiratory effort normal. Decreased breath sound, bilateral crackles.  Cardiovascular system: S1 & S2 heard, RRR. No JVD, murmurs, rubs, gallops or clicks. Plus 2 edema. Gastrointestinal system: Abdomen is nondistended, soft and nontender. No organomegaly or masses felt. Normal bowel sounds heard. Central nervous system: Alert and oriented to person, place. Slowly answer  questions.  Extremities: moves all 4 extremities.  Psychiatry: unable to evaluate    Data Reviewed: I have personally reviewed following labs and imaging studies  CBC:  Recent Labs Lab 12/21/16 1347 12/22/16 0543  WBC 11.1* 11.8*  NEUTROABS 9.4*  --   HGB 9.2* 9.3*  HCT 28.6* 29.4*  MCV 83.1 83.1  PLT 211 732   Basic Metabolic Panel:  Recent Labs Lab 12/21/16 1347 12/22/16 0543  NA 136 136  K 4.6 5.0  CL 106 102  CO2 19* 21*  GLUCOSE 101* 125*  BUN 50* 55*  CREATININE 1.71* 1.86*  CALCIUM 9.6 9.9   GFR: Estimated Creatinine Clearance: 36 mL/min (A) (by C-G formula based on SCr of 1.86 mg/dL (H)). Liver Function Tests: No results for input(s): AST, ALT, ALKPHOS, BILITOT, PROT, ALBUMIN in the last 168 hours. No results for input(s): LIPASE, AMYLASE in the last 168 hours. No results for input(s): AMMONIA in the last 168 hours. Coagulation Profile: No results for input(s): INR, PROTIME in the last 168 hours. Cardiac Enzymes: No results for input(s): CKTOTAL, CKMB, CKMBINDEX, TROPONINI in the last 168 hours. BNP (last 3 results) No results for input(s): PROBNP in the last 8760 hours. HbA1C: No results for input(s): HGBA1C in the last 72 hours. CBG:  Recent Labs Lab 12/21/16 2118 12/22/16 0802 12/22/16 1306  GLUCAP 221* 80 128*   Lipid Profile: No results for input(s): CHOL, HDL, LDLCALC, TRIG, CHOLHDL, LDLDIRECT in the last 72 hours. Thyroid Function Tests: No results for input(s): TSH, T4TOTAL, FREET4, T3FREE, THYROIDAB in the last 72 hours. Anemia Panel: No results for input(s): VITAMINB12, FOLATE, FERRITIN, TIBC, IRON, RETICCTPCT in the last 72 hours. Sepsis Labs:  Recent Labs Lab 12/21/16 1626  LATICACIDVEN 0.83    Recent Results (from the past 240 hour(s))  MRSA PCR Screening     Status: None   Collection Time: 12/21/16  9:46 PM  Result Value Ref Range Status   MRSA by PCR NEGATIVE NEGATIVE Final    Comment:        The GeneXpert MRSA  Assay (FDA approved for NASAL specimens only), is one component of a comprehensive MRSA colonization surveillance program. It is not intended to diagnose MRSA infection nor to guide or monitor treatment for MRSA infections.          Radiology Studies: Dg Chest 2 View  Result Date: 12/21/2016 CLINICAL DATA:  Shortness breath.  Dyspnea. EXAM: CHEST  2 VIEW COMPARISON:  One-view chest x-ray 10/01/2016. FINDINGS: The heart size is exaggerated by low lung volumes. And mild diffuse interstitial pattern is present. No significant airspace consolidation is evident. Mild left basilar airspace disease likely reflects atelectasis. The tracheostomy tube has been removed. The visualized soft tissues and bony thorax are  unremarkable. IMPRESSION: 1. Low lung volumes and mild diffuse interstitial pattern suggesting edema. 2. Asymmetric left basilar airspace disease likely reflects atelectasis. Infection is not excluded. Electronically Signed   By: San Morelle M.D.   On: 12/21/2016 13:40        Scheduled Meds: . aspirin  81 mg Oral Daily  . atorvastatin  40 mg Oral QPM  . budesonide  0.5 mg Nebulization BID  . ceFEPime (MAXIPIME) IV  2 g Intravenous Q24H  . cholecalciferol  1,000 Units Oral Daily  . docusate sodium  100 mg Oral Daily  . enoxaparin (LOVENOX) injection  0.5 mg/kg Subcutaneous Q24H  . famotidine  20 mg Oral Daily  . ferrous sulfate  325 mg Oral BID WC  . furosemide  40 mg Intravenous Q12H  . insulin aspart  0-9 Units Subcutaneous TID WC  . insulin glargine  15 Units Subcutaneous QHS  . ipratropium-albuterol  3 mL Nebulization BID  . mouth rinse  15 mL Mouth Rinse BID  . Melatonin  3 mg Oral QHS  . memantine  10 mg Oral BID  . methimazole  10 mg Per Tube Daily  . metoprolol  50 mg Oral BID  . multivitamin  1 tablet Oral QHS  . saccharomyces boulardii  250 mg Oral BID  . sodium bicarbonate  650 mg Oral BID  . sodium chloride flush  3 mL Intravenous Q12H    Continuous Infusions:   LOS: 1 day    Time spent: 35 minutes.     Elmarie Shiley, MD [facility] Pager [phone]  If 7PM-7AM, please contact night-coverage [email or web address] Password [email or web address] 12/22/2016, 3:40 PM

## 2016-12-23 ENCOUNTER — Inpatient Hospital Stay (HOSPITAL_COMMUNITY): Payer: Medicare Other

## 2016-12-23 DIAGNOSIS — E162 Hypoglycemia, unspecified: Secondary | ICD-10-CM

## 2016-12-23 DIAGNOSIS — E662 Morbid (severe) obesity with alveolar hypoventilation: Secondary | ICD-10-CM

## 2016-12-23 DIAGNOSIS — E0590 Thyrotoxicosis, unspecified without thyrotoxic crisis or storm: Secondary | ICD-10-CM

## 2016-12-23 DIAGNOSIS — I10 Essential (primary) hypertension: Secondary | ICD-10-CM

## 2016-12-23 LAB — GLUCOSE, CAPILLARY
GLUCOSE-CAPILLARY: 125 mg/dL — AB (ref 65–99)
GLUCOSE-CAPILLARY: 59 mg/dL — AB (ref 65–99)
GLUCOSE-CAPILLARY: 193 mg/dL — AB (ref 65–99)
GLUCOSE-CAPILLARY: 91 mg/dL (ref 65–99)
Glucose-Capillary: 292 mg/dL — ABNORMAL HIGH (ref 65–99)

## 2016-12-23 LAB — CBC
HEMATOCRIT: 27.8 % — AB (ref 36.0–46.0)
Hemoglobin: 8.9 g/dL — ABNORMAL LOW (ref 12.0–15.0)
MCH: 26.6 pg (ref 26.0–34.0)
MCHC: 32 g/dL (ref 30.0–36.0)
MCV: 83 fL (ref 78.0–100.0)
PLATELETS: 219 10*3/uL (ref 150–400)
RBC: 3.35 MIL/uL — ABNORMAL LOW (ref 3.87–5.11)
RDW: 14.4 % (ref 11.5–15.5)
WBC: 8 10*3/uL (ref 4.0–10.5)

## 2016-12-23 LAB — BASIC METABOLIC PANEL
ANION GAP: 11 (ref 5–15)
BUN: 57 mg/dL — ABNORMAL HIGH (ref 6–20)
CALCIUM: 9.6 mg/dL (ref 8.9–10.3)
CO2: 22 mmol/L (ref 22–32)
CREATININE: 1.79 mg/dL — AB (ref 0.44–1.00)
Chloride: 104 mmol/L (ref 101–111)
GFR calc Af Amer: 33 mL/min — ABNORMAL LOW (ref >60)
GFR, EST NON AFRICAN AMERICAN: 29 mL/min — AB (ref >60)
Glucose, Bld: 84 mg/dL (ref 65–99)
Potassium: 4.5 mmol/L (ref 3.5–5.1)
SODIUM: 137 mmol/L (ref 135–145)

## 2016-12-23 MED ORDER — FUROSEMIDE 10 MG/ML IJ SOLN
40.0000 mg | Freq: Every day | INTRAMUSCULAR | Status: DC
  Administered 2016-12-24: 40 mg via INTRAVENOUS
  Filled 2016-12-23: qty 4

## 2016-12-23 MED ORDER — ENOXAPARIN SODIUM 40 MG/0.4ML ~~LOC~~ SOLN
40.0000 mg | SUBCUTANEOUS | Status: DC
  Administered 2016-12-23: 40 mg via SUBCUTANEOUS
  Filled 2016-12-23: qty 0.4

## 2016-12-23 MED ORDER — DOXYCYCLINE HYCLATE 100 MG PO TABS
100.0000 mg | ORAL_TABLET | Freq: Two times a day (BID) | ORAL | Status: DC
Start: 2016-12-24 — End: 2016-12-24
  Administered 2016-12-24: 100 mg via ORAL
  Filled 2016-12-23: qty 1

## 2016-12-23 MED ORDER — INSULIN GLARGINE 100 UNIT/ML ~~LOC~~ SOLN: 9.0000 [IU] | Freq: Every day | SUBCUTANEOUS | Status: DC

## 2016-12-23 MED ORDER — INSULIN GLARGINE 100 UNIT/ML ~~LOC~~ SOLN
5.0000 [IU] | Freq: Every day | SUBCUTANEOUS | Status: DC
  Administered 2016-12-23: 5 [IU] via SUBCUTANEOUS
  Filled 2016-12-23: qty 0.05

## 2016-12-23 NOTE — Progress Notes (Signed)
PROGRESS NOTE   Angelica Beck  DGU:[id number] DOB: July 31, 1951 DOA: 12/21/2016 PCP: Delia Chimes, NP (Inactive)   Brief Narrative: Angelica Beck is a 66 y.o. female with medical history significant of DM, Asthma, HTN, Obesity hypoventilation syndrome, prior stroke who present from SNF with complaint of SOB. Patient is poor historian. She relates SOB that started today. She denies chest pain or cough, abdominal pain. She doesn't know when was her last BM. Reviewing records patient was seen by SNF physician, 3-28 for cough and SOB. She was diagnosed with PNA. She was started on avelox. She continue to have dyspnea. She was refer to ED for further evaluation.   ED Course: WBC at 11, Hb at 9, Cr 1.7.  Lactic acid at 0.83. Chest x ray: Low lung volumes and mild diffuse interstitial pattern suggesting edema. Asymmetric left basilar airspace disease likely reflects atelectasis. Infection is not excluded. BNP 244, troponin 0.08   Assessment & Plan:   Active Problems:   HYPERTENSION, BENIGN   CAD, NATIVE VESSEL   Obesity hypoventilation syndrome (HCC)   Hyperthyroidism   Chronic systolic HF (heart failure) (HCC)   PNA (pneumonia)  1-PNA;  Presents with dyspnea, chest x ary with left lower lobe infiltrates, mild leukocytosis.  Treat for health care associated, fail outpatient tx.  Blood culture,  Strep pneumonia antigen ordered and pending.  MRSA PCR negative, discontinued vancomycin.  Continue with cefepime.  Plan to de-escalate antibiotics 4/1 Speech swallow evaluation pending.   2-Acute hypoxic Respiratory failure.  In setting of PNA and pulmonary edema, HF Continue with oxygen supplementation.  Nebulizer treatments.  Continue IV lasix 40 mg daily.  Monitor renal function.   2-CKD stage 3 ;  DC meloxican. Would not restart this.  Monitor on IV lasix. Currently holding stable. Reducing IV lasix to once daily and likely oral lasix tomorrow. Strict I and O. Will place foley catheter  to be able to document output.  Check renal US  3-DM; with low BS will further reduce lantus dose, hypoglycemia precautions, monitor 5x per day Renal SSI.  CBG (last 3)   Recent Labs  12/22/16 2107 12/23/16 0842 12/23/16 0916  GLUCAP 110* 59* 125*    4-Hyperthyroidism; continue with methimazole.   5-HTN; hold Norvasc  in setting of infection. If blood pressure increase could resume in am.   6-Dementia; continue with Namenda.   7-Acute on chronic diastolic HF;  Per daughter patient was off lasix for a while at rehab. Lasix was resumed within the last month.  X ray with pulmonary edema also, LE with edma.  IV lasix 40 Mg IV daily.  Monitor renal function.   DVT prophylaxis: Lovenox Code Status: full code.  Family Communication: daughter Disposition Plan: remain in patient, start IV lasix.    Procedures:   none   Antimicrobials:   Cefepime 3-29  Received one dose of vancomycin.     Subjective: She is diuresing well.  Says she does not have SOB or CP.   Objective: Vitals:   12/22/16 1308 12/22/16 2108 12/23/16 0435 12/23/16 0900  BP: 137/72 (!) 134/56 (!) 138/55   Pulse: 61 71 (!) 56   Resp: '16 19 17   '$ Temp: 98.4 F (36.9 C) 98 F (36.7 C) 97.5 F (36.4 C)   TempSrc:  Oral Oral   SpO2: 100% 100% 100% 98%  Weight:   95.4 kg (210 lb 5.1 oz)   Height:        Intake/Output Summary (Last 24 hours) at 12/23/16 9563  Last data filed at 12/23/16 0725  Gross per 24 hour  Intake              170 ml  Output             3150 ml  Net            -2980 ml   Filed Weights   12/21/16 1240 12/22/16 0709 12/23/16 0435  Weight: 95.7 kg (211 lb) 97.9 kg (215 lb 14.4 oz) 95.4 kg (210 lb 5.1 oz)    Examination:  General exam: Appears calm and comfortable  Respiratory system: Respiratory effort normal. Decreased breath sound, no crackles.  Cardiovascular system: S1 & S2 heard, RRR. No JVD, murmurs, rubs, gallops or clicks. Plus 2 edema. Gastrointestinal system:  Abdomen is nondistended, soft and nontender. No organomegaly or masses felt. Normal bowel sounds heard. Central nervous system: Alert and oriented to person, place. Slowly answer questions.  Extremities: 2+ pitting edema BLEs.     Data Reviewed: I have personally reviewed following labs and imaging studies  CBC:  Recent Labs Lab 12/21/16 1347 12/22/16 0543 12/23/16 0422  WBC 11.1* 11.8* 8.0  NEUTROABS 9.4*  --   --   HGB 9.2* 9.3* 8.9*  HCT 28.6* 29.4* 27.8*  MCV 83.1 83.1 83.0  PLT 211 210 267   Basic Metabolic Panel:  Recent Labs Lab 12/21/16 1347 12/22/16 0543 12/23/16 0422  NA 136 136 137  K 4.6 5.0 4.5  CL 106 102 104  CO2 19* 21* 22  GLUCOSE 101* 125* 84  BUN 50* 55* 57*  CREATININE 1.71* 1.86* 1.79*  CALCIUM 9.6 9.9 9.6   GFR: Estimated Creatinine Clearance: 36.9 mL/min (A) (by C-G formula based on SCr of 1.79 mg/dL (H)). Liver Function Tests: No results for input(s): AST, ALT, ALKPHOS, BILITOT, PROT, ALBUMIN in the last 168 hours. No results for input(s): LIPASE, AMYLASE in the last 168 hours. No results for input(s): AMMONIA in the last 168 hours. Coagulation Profile: No results for input(s): INR, PROTIME in the last 168 hours. Cardiac Enzymes: No results for input(s): CKTOTAL, CKMB, CKMBINDEX, TROPONINI in the last 168 hours. BNP (last 3 results) No results for input(s): PROBNP in the last 8760 hours. HbA1C: No results for input(s): HGBA1C in the last 72 hours. CBG:  Recent Labs Lab 12/22/16 0802 12/22/16 1306 12/22/16 1751 12/22/16 2107 12/23/16 0842  GLUCAP 80 128* 137* 110* 59*   Lipid Profile: No results for input(s): CHOL, HDL, LDLCALC, TRIG, CHOLHDL, LDLDIRECT in the last 72 hours. Thyroid Function Tests: No results for input(s): TSH, T4TOTAL, FREET4, T3FREE, THYROIDAB in the last 72 hours. Anemia Panel: No results for input(s): VITAMINB12, FOLATE, FERRITIN, TIBC, IRON, RETICCTPCT in the last 72 hours. Sepsis Labs:  Recent  Labs Lab 12/21/16 1626  LATICACIDVEN 0.83    Recent Results (from the past 240 hour(s))  Blood Culture (routine x 2)     Status: None (Preliminary result)   Collection Time: 12/21/16  4:44 PM  Result Value Ref Range Status   Specimen Description BLOOD LEFT ANTECUBITAL  Final   Special Requests BOTTLES DRAWN AEROBIC AND ANAEROBIC 5CC  Final   Culture NO GROWTH < 24 HOURS  Final   Report Status PENDING  Incomplete  Blood Culture (routine x 2)     Status: None (Preliminary result)   Collection Time: 12/21/16  4:45 PM  Result Value Ref Range Status   Specimen Description BLOOD RIGHT ARM  Final   Special Requests BOTTLES DRAWN AEROBIC AND  ANAEROBIC 5CC  Final   Culture NO GROWTH < 24 HOURS  Final   Report Status PENDING  Incomplete  MRSA PCR Screening     Status: None   Collection Time: 12/21/16  9:46 PM  Result Value Ref Range Status   MRSA by PCR NEGATIVE NEGATIVE Final    Comment:        The GeneXpert MRSA Assay (FDA approved for NASAL specimens only), is one component of a comprehensive MRSA colonization surveillance program. It is not intended to diagnose MRSA infection nor to guide or monitor treatment for MRSA infections.     Radiology Studies: Dg Chest 2 View  Result Date: 12/21/2016 CLINICAL DATA:  Shortness breath.  Dyspnea. EXAM: CHEST  2 VIEW COMPARISON:  One-view chest x-ray 10/01/2016. FINDINGS: The heart size is exaggerated by low lung volumes. And mild diffuse interstitial pattern is present. No significant airspace consolidation is evident. Mild left basilar airspace disease likely reflects atelectasis. The tracheostomy tube has been removed. The visualized soft tissues and bony thorax are unremarkable. IMPRESSION: 1. Low lung volumes and mild diffuse interstitial pattern suggesting edema. 2. Asymmetric left basilar airspace disease likely reflects atelectasis. Infection is not excluded. Electronically Signed   By: San Morelle M.D.   On: 12/21/2016 13:40    US Renal  Result Date: 12/22/2016 CLINICAL DATA:  Renal failure.  Acute kidney injury. EXAM: RENAL / URINARY TRACT ULTRASOUND COMPLETE COMPARISON:  CT 08/09/2016.  Renal ultrasound 06/24/2016 FINDINGS: Right Kidney: Length: 11.7 cm. The previous increased renal echogenicity is not currently appreciated. No mass or hydronephrosis visualized. Left Kidney: Length: 10.6 cm. Echogenicity within normal limits. No mass or hydronephrosis visualized. Cyst in the lower kidney on prior exam is not well demonstrated. Bladder: Decompressed by Foley catheter and not evaluated. IMPRESSION: No hydronephrosis or obstructive uropathy. Electronically Signed   By: Jeb Levering M.D.   On: 12/22/2016 22:29   Scheduled Meds: . aspirin  81 mg Oral Daily  . atorvastatin  40 mg Oral QPM  . budesonide  0.5 mg Nebulization BID  . ceFEPime (MAXIPIME) IV  2 g Intravenous Q24H  . cholecalciferol  1,000 Units Oral Daily  . docusate sodium  100 mg Oral Daily  . enoxaparin (LOVENOX) injection  0.5 mg/kg Subcutaneous Q24H  . famotidine  20 mg Oral Daily  . ferrous sulfate  325 mg Oral BID WC  . furosemide  40 mg Intravenous Q12H  . insulin aspart  0-9 Units Subcutaneous TID WC  . insulin glargine  9 Units Subcutaneous QHS  . ipratropium-albuterol  3 mL Nebulization BID  . mouth rinse  15 mL Mouth Rinse BID  . Melatonin  3 mg Oral QHS  . memantine  10 mg Oral BID  . methimazole  10 mg Per Tube Daily  . metoprolol  50 mg Oral BID  . multivitamin  1 tablet Oral QHS  . saccharomyces boulardii  250 mg Oral BID  . sodium bicarbonate  650 mg Oral BID  . sodium chloride flush  3 mL Intravenous Q12H   Continuous Infusions:   LOS: 2 days   Time spent: 25 minutes.   Irwin Brakeman, MD [facility] Pager [phone]  If 7PM-7AM, please contact night-coverage [email or web address] Password [email or web address] 12/23/2016, 9:05 AM

## 2016-12-23 NOTE — Evaluation (Signed)
Clinical/Bedside Swallow Evaluation Patient Details  Name: Angelica Beck MRN: [id number] Date of Birth: Mar 17, 1951  Today's Date: 12/23/2016 Time: SLP Start Time (ACUTE ONLY): 1017 SLP Stop Time (ACUTE ONLY): 1425 SLP Time Calculation (min) (ACUTE ONLY): 10 min  Past Medical History:  Past Medical History:  Diagnosis Date  . Arthritis   . Asthma    09/07/16  . Diabetes mellitus, type 2 ([place])   . Hyperlipidemia   . Hypertension   . Hyperthyroidism   . Obesity hypoventilation syndrome ([place]) 06/20/2013  . Stroke [facility])    Past Surgical History:  Past Surgical History:  Procedure Laterality Date  . IR GENERIC HISTORICAL  07/12/2016   IR US GUIDE VASC ACCESS RIGHT 07/12/2016 [name], MD [facility]  . IR GENERIC HISTORICAL  07/12/2016   IR FLUORO GUIDE CV LINE RIGHT 07/12/2016 [name], MD [facility]  . IR GENERIC HISTORICAL  07/17/2016   IR GASTROSTOMY TUBE MOD SED 07/17/2016 [name], MD [facility]  . IR GENERIC HISTORICAL  08/23/2016   IR REMOVAL TUN CV CATH W/O FL 08/23/2016 [name], MD [facility]  . IR GENERIC HISTORICAL  11/09/2016   IR GASTROSTOMY TUBE REMOVAL 11/09/2016 [name], PA-C [facility]  . TRACHEOSTOMY TUBE PLACEMENT N/A 07/14/2016   Procedure: TRACHEOSTOMY, THYROID ISTHMUSECTOMY;  Surgeon: [name], MD;  Location: [place];  Service: ENT;  Laterality: N/A;  . VENTRAL HERNIA REPAIR     HPI:  Angelica Beck a 66 y.o.femalewith medical history significant of DM, Asthma, HTN, Obesity hypoventilation syndrome, prior stroke who presented from SNF with complaint of SOB. Patient is poor historian. Per MD notes, patient was seen by SNF physician, 3-28 for cough and SOB. She was diagnosed with PNA. She was started on avelox. She continued to have dyspnea. Chest x ray: Low lung volumes and mild diffuse interstitial pattern suggesting edema. Asymmetric left basilar airspace disease likely reflects atelectasis. Infection is not  excluded. Seen by SLP during prior admissions, most recent MBS 10/13/16 with findings: mild oropharyngeal dysphagia with no aspiration across trials. Prolonged bolus transit and decreased bolus cohesion. Although her pharyngeal swallow is delayed and thin liquids reach the pyriform sinuses, she maintains great airway protection. Solids and liquids were tested with PMV off as well, with no change in airway protection during the swallow. Dys 2 diet and thin liquids were recommended.    Assessment / Plan / Recommendation Clinical Impression  Patient presents with mild-moderate aspiration risk given prior history of CVA, dysphagia, pneumonia. She is alert, oriented and following basic commands. Voice is low in intensity. Swallows water in excess of 3oz with no overt signs of aspiration in consecutive cup sips. Cough x1 with multiple consecutive straw sips, suggestive of reduced airway protection. No further signs of aspiration noted in remaining trials without straw. Pt is edentulous, and requires extended time for mastication of solids. Recommend dys 2 diet with thin liquids, no straws, meds whole in puree. SLP will f/u for tolerance. Expect pt will tolerate advancement of solids if dentures are available. SLP to f/u for tolerance, advancement.  SLP Visit Diagnosis: Dysphagia, unspecified (R13.10)    Aspiration Risk  Mild aspiration risk    Diet Recommendation Dysphagia 2 (Fine chop);Thin liquid   Liquid Administration via: Cup;No straw Medication Administration: Whole meds with puree Supervision: Patient able to self feed;Intermittent supervision to cue for compensatory strategies Compensations: Slow rate;Small sips/bites Postural Changes: Seated upright at 90 degrees    Other  Recommendations Oral Care Recommendations: Oral care  BID   Follow up Recommendations Other (comment) (TBD)      Frequency and Duration min 2x/week  2 weeks       Prognosis Prognosis for Safe Diet Advancement: Good  (with dentures available)      Swallow Study   General Date of Onset: 12/21/16 HPI: Angelica Beck a 66 y.o.femalewith medical history significant of DM, Asthma, HTN, Obesity hypoventilation syndrome, prior stroke who presented from SNF with complaint of SOB. Patient is poor historian. Per MD notes, patient was seen by SNF physician, 3-28 for cough and SOB. She was diagnosed with PNA. She was started on avelox. She continued to have dyspnea. Chest x ray: Low lung volumes and mild diffuse interstitial pattern suggesting edema. Asymmetric left basilar airspace disease likely reflects atelectasis. Infection is not excluded. Seen by SLP during prior admissions, most recent MBS 10/13/16 with findings: mild oropharyngeal dysphagia with no aspiration across trials. Prolonged bolus transit and decreased bolus cohesion. Although her pharyngeal swallow is delayed and thin liquids reach the pyriform sinuses, she maintains great airway protection. Solids and liquids were tested with PMV off as well, with no change in airway protection during the swallow. Dys 2 diet and thin liquids were recommended.  Type of Study: Bedside Swallow Evaluation Previous Swallow Assessment: see HPI Diet Prior to this Study: Regular;Thin liquids Temperature Spikes Noted: No Respiratory Status: Nasal cannula History of Recent Intubation: No Behavior/Cognition: Cooperative;Alert Oral Cavity Assessment: Within Functional Limits Oral Care Completed by SLP: No Oral Cavity - Dentition: Edentulous Vision: Functional for self-feeding Self-Feeding Abilities: Able to feed self Patient Positioning: Upright in bed Baseline Vocal Quality: Low vocal intensity Volitional Cough: Strong Volitional Swallow: Able to elicit    Oral/Motor/Sensory Function Overall Oral Motor/Sensory Function: Mild impairment Facial ROM: Other (Comment);Reduced right;Reduced left Facial Symmetry: Within Functional Limits Facial Strength: Reduced  left;Reduced right Facial Sensation: Within Functional Limits Lingual ROM: Within Functional Limits Lingual Symmetry: Within Functional Limits Lingual Strength: Within Functional Limits Lingual Sensation: Within Functional Limits Velum: Within Functional Limits Mandible: Within Functional Limits   Ice Chips Ice chips: Within functional limits   Thin Liquid Thin Liquid: Impaired Presentation: Cup;Straw;Self Fed Oral Phase Impairments: Reduced labial seal Oral Phase Functional Implications: Left anterior spillage Pharyngeal  Phase Impairments: Cough - Immediate Other Comments: cough with straw use, only    Nectar Thick Nectar Thick Liquid: Not tested   Honey Thick Honey Thick Liquid: Not tested   Puree Puree: Within functional limits   Solid   GO   Deneise Lever, [place] CF-SLP Speech-Language Pathologist [phone] Solid: Impaired Presentation: Self Fed Oral Phase Impairments: Impaired mastication Oral Phase Functional Implications: Impaired mastication;Prolonged oral transit        Aliene Altes 12/23/2016,2:34 PM

## 2016-12-24 LAB — GLUCOSE, CAPILLARY
GLUCOSE-CAPILLARY: 72 mg/dL (ref 65–99)
GLUCOSE-CAPILLARY: 151 mg/dL — AB (ref 65–99)
Glucose-Capillary: 210 mg/dL — ABNORMAL HIGH (ref 65–99)

## 2016-12-24 MED ORDER — FUROSEMIDE 40 MG PO TABS: 40.0000 mg | ORAL_TABLET | ORAL | Status: DC

## 2016-12-24 MED ORDER — DOXYCYCLINE HYCLATE 100 MG PO TABS: 100.0000 mg | ORAL_TABLET | Freq: Two times a day (BID) | ORAL | 0 refills | Status: AC

## 2016-12-24 MED ORDER — AMLODIPINE BESYLATE 5 MG PO TABS: 5.0000 mg | ORAL_TABLET | Freq: Every day | ORAL | Status: DC

## 2016-12-24 MED ORDER — INSULIN GLARGINE 100 UNIT/ML ~~LOC~~ SOLN: 5.0000 [IU] | Freq: Every day | SUBCUTANEOUS | 11 refills | Status: DC

## 2016-12-24 MED ORDER — FERROUS SULFATE 325 (65 FE) MG PO TABS
325.0000 mg | ORAL_TABLET | Freq: Every day | ORAL | 3 refills | Status: DC
Start: 2016-12-24 — End: 2017-03-20

## 2016-12-24 NOTE — NC FL2 (Signed)
[place] LEVEL OF CARE SCREENING TOOL     IDENTIFICATION  Patient Name: Angelica Beck Birthdate: 06/28/1951 Sex: female Admission Date (Current Location): 12/21/2016  [facility] and [place] Number:  Herbalist and Address:  [facility], [place] [address], [place], [place] [address]      Provider Number: [id number]  Attending Physician Name and Address:  Murlean Iba, MD  Relative Name and Phone Number:  Dtr    Current Level of Care: Hospital Recommended Level of Care: [place] Prior Approval Number:    Date Approved/Denied:   PASRR Number: [id number]  Discharge Plan: SNF    Current Diagnoses: Patient Active Problem List   Diagnosis Date Noted  . PNA (pneumonia) 12/21/2016  . Edema 11/02/2016  . Diastolic dysfunction 91/63/8466  . Osteoarthritis of both knees 01-05-2017  . At risk for aspiration 10/03/2016  . Normocytic normochromic anemia 09/07/2016  . Acute on chronic respiratory failure ([place])   . (HFpEF) heart failure with preserved ejection fraction ([place])   . Enteritis due to Clostridium difficile   . SBO (small bowel obstruction) 08/10/2016  . Collapse of left lung   . S/P percutaneous endoscopic gastrostomy (PEG) tube placement ([place])   . Tracheostomy status ([place])   . HCAP (healthcare-associated pneumonia) 07/16/2016  . Encephalopathy   . Palliative care encounter   . Pressure injury of skin 06/24/2016  . Acute respiratory failure ([place]) 06/21/2016  . Septic shock ([place]) 06/20/2016  . Chronic respiratory failure with hypoxia ([place]) 11/29/2015  . Essential hypertension   . Lactic acidosis 08/02/2015  . History of CVA (cerebrovascular accident) 08/02/2015  . Chronic systolic HF (heart failure) ([place]) 08/02/2015  . Cognitive impairment 08/02/2015  . Abnormality of gait 02/08/2015  . Hyperthyroidism 02/08/2015  . Obesity hypoventilation syndrome ([place]) 06/20/2013  . Hyperglycemia 06/28/2009  .  HYPERLIPIDEMIA-MIXED 06/28/2009  . HYPERTENSION, BENIGN 06/28/2009  . CAD, NATIVE VESSEL 06/28/2009  . DYSPNEA 06/28/2009    Orientation RESPIRATION BLADDER Height & Weight     Self, Time, Situation, Place  Normal Continent Weight: 208 lb 8.9 oz (94.6 kg) Height:  '5\' 6"'$  (167.6 cm)  BEHAVIORAL SYMPTOMS/MOOD NEUROLOGICAL BOWEL NUTRITION STATUS      Continent Diet (See DC Summary)  AMBULATORY STATUS COMMUNICATION OF NEEDS Skin   Limited Assist Verbally Normal                       Personal Care Assistance Level of Assistance  Bathing, Dressing Bathing Assistance: Limited assistance   Dressing Assistance: Limited assistance     Functional Limitations Info  Sight, Hearing, Speech Sight Info: Adequate Hearing Info: Adequate Speech Info: Adequate    SPECIAL CARE FACTORS FREQUENCY  PT (By licensed PT)     PT Frequency: 5x wk OT Frequency: 5x wk            Contractures Contractures Info: Not present    Additional Factors Info  Code Status, Allergies Code Status Info: Full Allergies Info: Penicillins, Lactose Intolerance (Gi)           Current Medications (12/24/2016):  This is the current hospital active medication list Current Facility-Administered Medications  Medication Dose Route Frequency Provider Last Rate Last Dose  . 0.9 %  sodium chloride infusion  250 mL Intravenous PRN Belkys A Regalado, MD      . acetaminophen (TYLENOL) tablet 650 mg  650 mg Oral Q6H PRN Belkys A Regalado, MD   650 mg at  12/23/16 2019   Or  . acetaminophen (TYLENOL) suppository 650 mg  650 mg Rectal Q6H PRN Belkys A Regalado, MD      . albuterol (PROVENTIL) (2.5 MG/3ML) 0.083% nebulizer solution 2.5 mg  2.5 mg Nebulization Q4H PRN Belkys A Regalado, MD      . aspirin chewable tablet 81 mg  81 mg Oral Daily Belkys A Regalado, MD   81 mg at 12/24/16 1009  . atorvastatin (LIPITOR) tablet 40 mg  40 mg Oral QPM Belkys A Regalado, MD   40 mg at 12/23/16 1711  . bisacodyl (DULCOLAX)  suppository 10 mg  10 mg Rectal PRN Belkys A Regalado, MD      . budesonide (PULMICORT) nebulizer solution 0.5 mg  0.5 mg Nebulization BID Belkys A Regalado, MD   0.5 mg at 12/24/16 0955  . cholecalciferol (VITAMIN D) tablet 1,000 Units  1,000 Units Oral Daily Belkys A Regalado, MD   1,000 Units at 12/24/16 1009  . docusate sodium (COLACE) capsule 100 mg  100 mg Oral Daily Belkys A Regalado, MD   100 mg at 12/24/16 1010  . doxycycline (VIBRA-TABS) tablet 100 mg  100 mg Oral Q12H [place] Marisa Hua, MD   100 mg at 12/24/16 1009  . enoxaparin (LOVENOX) injection 40 mg  40 mg Subcutaneous Q24H Clanford L Johnson, MD   40 mg at 12/23/16 2019  . famotidine (PEPCID) tablet 20 mg  20 mg Oral Daily Belkys A Regalado, MD   20 mg at 12/24/16 1009  . ferrous sulfate tablet 325 mg  325 mg Oral BID WC Belkys A Regalado, MD   325 mg at 12/24/16 1009  . furosemide (LASIX) injection 40 mg  40 mg Intravenous Daily [place] Marisa Hua, MD   40 mg at 12/24/16 1009  . insulin aspart (novoLOG) injection 0-9 Units  0-9 Units Subcutaneous TID WC Belkys A Regalado, MD   2 Units at 12/24/16 1205  . insulin glargine (LANTUS) injection 5 Units  5 Units Subcutaneous QHS [place] Marisa Hua, MD   5 Units at 12/23/16 2234  . ipratropium-albuterol (DUONEB) 0.5-2.5 (3) MG/3ML nebulizer solution 3 mL  3 mL Nebulization BID Belkys A Regalado, MD   3 mL at 12/24/16 0951  . MEDLINE mouth rinse  15 mL Mouth Rinse BID Belkys A Regalado, MD   15 mL at 12/24/16 1000  . Melatonin TABS 3 mg  3 mg Oral QHS Belkys A Regalado, MD   3 mg at 12/23/16 2234  . memantine (NAMENDA) tablet 10 mg  10 mg Oral BID Belkys A Regalado, MD   10 mg at 12/24/16 1009  . methimazole (TAPAZOLE) tablet 10 mg  10 mg Per Tube Daily Belkys A Regalado, MD   10 mg at 12/24/16 1009  . metoprolol (LOPRESSOR) tablet 50 mg  50 mg Oral BID Belkys A Regalado, MD   50 mg at 12/24/16 1009  . multivitamin (RENA-VIT) tablet 1 tablet  1 tablet Oral QHS Belkys A Regalado, MD   1  tablet at 12/23/16 2234  . MUSCLE RUB CREA   Topical BID PRN Belkys A Regalado, MD      . ondansetron (ZOFRAN) tablet 4 mg  4 mg Oral Q6H PRN Belkys A Regalado, MD       Or  . ondansetron (ZOFRAN) injection 4 mg  4 mg Intravenous Q6H PRN Belkys A Regalado, MD      . saccharomyces boulardii (FLORASTOR) capsule 250 mg  250 mg Oral BID Elmarie Shiley, MD  250 mg at 12/24/16 1009  . sodium bicarbonate tablet 650 mg  650 mg Oral BID Belkys A Regalado, MD   650 mg at 12/24/16 1009  . sodium chloride flush (NS) 0.9 % injection 3 mL  3 mL Intravenous Q12H Belkys A Regalado, MD   3 mL at 12/24/16 1000  . sodium chloride flush (NS) 0.9 % injection 3 mL  3 mL Intravenous PRN Belkys A Regalado, MD         Discharge Medications: Please see discharge summary for a list of discharge medications.  Relevant Imaging Results:  Relevant Lab Results:   Additional Information [id number], Romond Pipkins B, LCSWA

## 2016-12-24 NOTE — Clinical Social Work Note (Signed)
Pt's emergency contact Dtr [name] has a full VM, CSW unable to leave a message advising that pt will be transferring over to [facility] this afternoon.  Nyshawn Gowdy B. Joline Maxcy Clinical Social Work Dept Weekend Social Worker [phone] 12:29 PM  .

## 2016-12-24 NOTE — Discharge Summary (Signed)
Physician Discharge Summary  Angelica Beck ZOX:[id number] DOB: 02/25/1951 DOA: 12/21/2016  PCP: Delia Chimes, NP (Inactive)  Admit date: 12/21/2016 Discharge date: 12/24/2016  Admitted From: SNF Disposition:  SNF  Recommendations for Outpatient Follow-up:  1. Follow up with PCP in 2 weeks 2. Please obtain BMP/CBC in one week 3. Monitor blood sugar 4 times per day with hypoglycemia precautions  4. Please monitor weights every 3 days and report to PCP any weight gain of 4 pounds or more 5. Follow up with cardiologist in 2-3 weeks 6. Avoid NSAIDS, use tylenol for pain  Discharge Condition: STABLE  CODE STATUS: FULL  Diet recommendation: Heart Healthy / Carb Modified  Brief/Interim Summary: HPI: Angelica Beck is a 66 y.o. female with medical history significant of DM, Asthma, HTN, Obesity hypoventilation syndrome, prior stroke who present from SNF with complaint of SOB. Patient is poor historian. She relates SOB that started today. She denies chest pain or cough, abdominal pain. She doesn't know when was her last BM/  Reviewing records patient was seen by SNF physician, 3-28 for cough and SOB. She was diagnosed with PNA. She was started on avelox. She continue to have dyspnea. She was refer to ED for further evaluation.   ED Course: WBC at 11, Hb at 9, Cr 1.7 increase from baseline at 1.3. Lactic acid at 0.83. Chest x ray: Low lung volumes and mild diffuse interstitial pattern suggesting edema. Asymmetric left basilar airspace disease likely reflects atelectasis. Infection is not excluded. BNP 244, troponin 0.08   Assessment & Plan:   Active Problems:   HYPERTENSION, BENIGN   CAD, NATIVE VESSEL   Obesity hypoventilation syndrome (HCC)   Hyperthyroidism   Chronic systolic HF (heart failure) (HCC)   PNA (pneumonia)  1-PNA;  Presented with dyspnea, chest x ary with left lower lobe infiltrates, mild leukocytosis.  Treat for healthcare associated, failed outpatient tx.  Blood  culture,  Strep pneumonia antigen ordered and no growth to date  MRSA PCR negative, discontinued vancomycin.  Treated initially with cefepime.  Plan to de-escalate antibiotics 4/1 to doxycycline for 5 more days then STOP Speech swallow evaluation recommended dysphagia 2 diet, thin liquids.     2-Acute hypoxic Respiratory failure.  In setting of PNA and pulmonary edema, HF Continue with oxygen supplementation.  Nebulizer treatments.  Resume 40 mg every other day.  Monitor renal function.   2-CKD stage 3 ;  DC meloxican. Would not restart this.  Monitor on IV lasix. Currently holding stable. Reducing IV lasix to once daily and likely oral lasix tomorrow. Strict I and O. Will place foley catheter to be able to document output.  Check renal US AVOID NSAIDS, use tylenol for pain.   3-DM; with low BS will further reduce lantus dose, hypoglycemia precautions, monitor 5x per day Renal SSI.   CBG (last 3)   Recent Labs  12/24/16 0325 12/24/16 0739 12/24/16 1140  GLUCAP 210* 72 151*   4-Hyperthyroidism; continue with methimazole.   5-HTN;hold Norvasc  in setting of infection. If blood pressure increase could resume in am.   6-Dementia; continue with Namenda.   7-Acute on chronic diastolic HF;  Per daughter patient was off lasix for a while at rehab. Lasix was resumed within the last month.  X ray with pulmonary edema also, LE with edma.  Pt diuresed >5L in  Hospital and felt much better.  Resume 40 mg every other day.  Monitor renal function.   DVT prophylaxis: Lovenox Code Status: full code.  Family Communication:  daughter Disposition Plan: SNF.   Discharge Diagnoses:  Active Problems:   HYPERTENSION, BENIGN   CAD, NATIVE VESSEL   Obesity hypoventilation syndrome (HCC)   Hyperthyroidism   Chronic systolic HF (heart failure) (HCC)   PNA (pneumonia)  Discharge Instructions  Discharge Instructions    Increase activity slowly    Complete by:  As directed       Allergies as of 12/24/2016      Reactions   Penicillins Swelling   FACIAL SWELLING Has patient had a PCN reaction causing immediate rash, facial/tongue/throat swelling, SOB or lightheadedness with hypotension: Yes Has patient had a PCN reaction causing severe rash involving mucus membranes or skin necrosis: No Has patient had a PCN reaction that required hospitalization: No Has patient had a PCN reaction occurring within the last 10 years: No If all of the above answers are "NO", then may proceed with Cephalosporin use.   Lactose Intolerance (gi) Diarrhea, Nausea And Vomiting      Medication List    STOP taking these medications   meloxicam 7.5 MG tablet Commonly known as:  MOBIC   moxifloxacin 400 MG tablet Commonly known as:  AVELOX   predniSONE 10 MG tablet Commonly known as:  DELTASONE     TAKE these medications   acetaminophen 500 MG tablet Commonly known as:  TYLENOL Take 500 mg by mouth every 6 (six) hours as needed.   amLODipine 5 MG tablet Commonly known as:  NORVASC Take 1 tablet (5 mg total) by mouth daily. What changed:  medication strength  how much to take   aspirin 81 MG chewable tablet Chew 81 mg by mouth daily.   atorvastatin 40 MG tablet Commonly known as:  LIPITOR Place 40 mg into feeding tube every evening.   b complex-vitamin c-folic acid 0.8 MG Tabs tablet Take 1 tablet by mouth every morning.   budesonide 0.5 MG/2ML nebulizer solution Commonly known as:  PULMICORT Take 0.5 mg by nebulization 2 (two) times daily.   cholecalciferol 1000 units tablet Commonly known as:  VITAMIN D Take 1,000 Units by mouth daily.   docusate sodium 100 MG capsule Commonly known as:  COLACE Take 100 mg by mouth daily.   doxycycline 100 MG tablet Commonly known as:  VIBRA-TABS Take 1 tablet (100 mg total) by mouth every 12 (twelve) hours. Take for 5 days then STOP 12/30/16 Start taking on:  12/25/2016   famotidine 20 MG tablet Commonly known as:   PEPCID Place 1 tablet (20 mg total) into feeding tube daily.   ferrous sulfate 325 (65 FE) MG tablet Take 1 tablet (325 mg total) by mouth daily with breakfast. What changed:  when to take this   furosemide 40 MG tablet Commonly known as:  LASIX Take 1 tablet (40 mg total) by mouth every other day. What changed:  when to take this   insulin glargine 100 UNIT/ML injection Commonly known as:  LANTUS Inject 0.05 mLs (5 Units total) into the skin at bedtime. What changed:  how much to take   ipratropium-albuterol 0.5-2.5 (3) MG/3ML Soln Commonly known as:  DUONEB Take 3 mLs by nebulization every 4 (four) hours as needed (for dyspnea).   Melatonin 3 MG Tabs Place 3 mg into feeding tube at bedtime.   memantine 10 MG tablet Commonly known as:  NAMENDA Take 10 mg by mouth 2 (two) times daily.   methimazole 10 MG tablet Commonly known as:  TAPAZOLE Place 1 tablet (10 mg total) into feeding tube daily.  metoprolol 50 MG tablet Commonly known as:  LOPRESSOR Take 50 mg by mouth 2 (two) times daily.   ondansetron 4 MG tablet Commonly known as:  ZOFRAN Take 4 mg by mouth every 8 (eight) hours as needed for nausea or vomiting.   saccharomyces boulardii 250 MG capsule Commonly known as:  FLORASTOR Take 250 mg by mouth daily as needed. For loose stool   trolamine salicylate 10 % cream Commonly known as:  ASPERCREME Apply to knees twice daily for pain      Follow-up Information    Delia Chimes, NP. Schedule an appointment as soon as possible for a visit in 2 week(s).   Specialty:  Nurse Practitioner         Allergies  Allergen Reactions  . Penicillins Swelling    FACIAL SWELLING Has patient had a PCN reaction causing immediate rash, facial/tongue/throat swelling, SOB or lightheadedness with hypotension: Yes Has patient had a PCN reaction causing severe rash involving mucus membranes or skin necrosis: No Has patient had a PCN reaction that required hospitalization:  No Has patient had a PCN reaction occurring within the last 10 years: No If all of the above answers are "NO", then may proceed with Cephalosporin use.   . Lactose Intolerance (Gi) Diarrhea and Nausea And Vomiting    Procedures/Studies: Dg Chest 2 View  Result Date: 12/21/2016 CLINICAL DATA:  Shortness breath.  Dyspnea. EXAM: CHEST  2 VIEW COMPARISON:  One-view chest x-ray 10/01/2016. FINDINGS: The heart size is exaggerated by low lung volumes. And mild diffuse interstitial pattern is present. No significant airspace consolidation is evident. Mild left basilar airspace disease likely reflects atelectasis. The tracheostomy tube has been removed. The visualized soft tissues and bony thorax are unremarkable. IMPRESSION: 1. Low lung volumes and mild diffuse interstitial pattern suggesting edema. 2. Asymmetric left basilar airspace disease likely reflects atelectasis. Infection is not excluded. Electronically Signed   By: San Morelle M.D.   On: 12/21/2016 13:40   US Renal  Result Date: 12/22/2016 CLINICAL DATA:  Renal failure.  Acute kidney injury. EXAM: RENAL / URINARY TRACT ULTRASOUND COMPLETE COMPARISON:  CT 08/09/2016.  Renal ultrasound 06/24/2016 FINDINGS: Right Kidney: Length: 11.7 cm. The previous increased renal echogenicity is not currently appreciated. No mass or hydronephrosis visualized. Left Kidney: Length: 10.6 cm. Echogenicity within normal limits. No mass or hydronephrosis visualized. Cyst in the lower kidney on prior exam is not well demonstrated. Bladder: Decompressed by Foley catheter and not evaluated. IMPRESSION: No hydronephrosis or obstructive uropathy. Electronically Signed   By: Jeb Levering M.D.   On: 12/22/2016 22:29   Dg Chest Port 1 View  Result Date: 12/23/2016 CLINICAL DATA:  CHF. Hx stroke, HTN, diabetes, asthma, former smoker. EXAM: PORTABLE CHEST 1 VIEW COMPARISON:  12/21/2016 FINDINGS: Cardiac silhouette is normal in size. There is a left coronary artery  stent. No mediastinal or hilar masses. No evidence of adenopathy. He clear lungs.  No pleural effusion.  No pneumothorax. Skeletal structures are grossly intact. IMPRESSION: No active disease. Electronically Signed   By: Lajean Manes M.D.   On: 12/23/2016 09:28    (Echo, Carotid, EGD, Colonoscopy, ERCP)   Subjective: Pt says she feels much better.  No SOB.  No chest pain.    Discharge Exam: Vitals:   12/23/16 2141 12/24/16 0539  BP: (!) 143/52 (!) 138/50  Pulse: 81 69  Resp: 20 18  Temp: 97.4 F (36.3 C) 98.2 F (36.8 C)   Vitals:   12/23/16 2044 12/23/16 2141 12/24/16 1062  12/24/16 0954  BP:  (!) 143/52 (!) 138/50   Pulse:  81 69   Resp:  20 18   Temp:  97.4 F (36.3 C) 98.2 F (36.8 C)   TempSrc:  Oral Oral   SpO2: 99% 100% 100% 100%  Weight:   94.6 kg (208 lb 8.9 oz)   Height:       General exam: Appears calm and comfortable  Respiratory system: Respiratory effort normal. Decreased breath sound, no crackles.  Cardiovascular system: S1 & S2 heard, RRR. No JVD, murmurs, rubs, gallops or clicks. Plus 2 edema. Gastrointestinal system: Abdomen is nondistended, soft and nontender. No organomegaly or masses felt. Normal bowel sounds heard. Central nervous system: Alert and oriented to person, place. Slowly answer questions.  Extremities: 1+ pitting edema BLEs.    The results of significant diagnostics from this hospitalization (including imaging, microbiology, ancillary and laboratory) are listed below for reference.     Microbiology: Recent Results (from the past 240 hour(s))  Blood Culture (routine x 2)     Status: None (Preliminary result)   Collection Time: 12/21/16  4:44 PM  Result Value Ref Range Status   Specimen Description BLOOD LEFT ANTECUBITAL  Final   Special Requests BOTTLES DRAWN AEROBIC AND ANAEROBIC 5CC  Final   Culture NO GROWTH 2 DAYS  Final   Report Status PENDING  Incomplete  Blood Culture (routine x 2)     Status: None (Preliminary result)    Collection Time: 12/21/16  4:45 PM  Result Value Ref Range Status   Specimen Description BLOOD RIGHT ARM  Final   Special Requests BOTTLES DRAWN AEROBIC AND ANAEROBIC 5CC  Final   Culture NO GROWTH 2 DAYS  Final   Report Status PENDING  Incomplete  MRSA PCR Screening     Status: None   Collection Time: 12/21/16  9:46 PM  Result Value Ref Range Status   MRSA by PCR NEGATIVE NEGATIVE Final    Comment:        The GeneXpert MRSA Assay (FDA approved for NASAL specimens only), is one component of a comprehensive MRSA colonization surveillance program. It is not intended to diagnose MRSA infection nor to guide or monitor treatment for MRSA infections.      Labs: BNP (last 3 results)  Recent Labs  08/08/16 1419 12/21/16 1347  BNP 67.5 295.2*   Basic Metabolic Panel:  Recent Labs Lab 12/21/16 1347 12/22/16 0543 12/23/16 0422  NA 136 136 137  K 4.6 5.0 4.5  CL 106 102 104  CO2 19* 21* 22  GLUCOSE 101* 125* 84  BUN 50* 55* 57*  CREATININE 1.71* 1.86* 1.79*  CALCIUM 9.6 9.9 9.6   Liver Function Tests: No results for input(s): AST, ALT, ALKPHOS, BILITOT, PROT, ALBUMIN in the last 168 hours. No results for input(s): LIPASE, AMYLASE in the last 168 hours. No results for input(s): AMMONIA in the last 168 hours. CBC:  Recent Labs Lab 12/21/16 1347 12/22/16 0543 12/23/16 0422  WBC 11.1* 11.8* 8.0  NEUTROABS 9.4*  --   --   HGB 9.2* 9.3* 8.9*  HCT 28.6* 29.4* 27.8*  MCV 83.1 83.1 83.0  PLT 211 210 219   Cardiac Enzymes: No results for input(s): CKTOTAL, CKMB, CKMBINDEX, TROPONINI in the last 168 hours. BNP: Invalid input(s): POCBNP CBG:  Recent Labs Lab 12/23/16 1151 12/23/16 1722 12/23/16 2144 12/24/16 0325 12/24/16 0739  GLUCAP 193* 91 292* 210* 72   D-Dimer No results for input(s): DDIMER in the last 72 hours. Hgb  A1c No results for input(s): HGBA1C in the last 72 hours. Lipid Profile No results for input(s): CHOL, HDL, LDLCALC, TRIG, CHOLHDL,  LDLDIRECT in the last 72 hours. Thyroid function studies No results for input(s): TSH, T4TOTAL, T3FREE, THYROIDAB in the last 72 hours.  Invalid input(s): FREET3 Anemia work up No results for input(s): VITAMINB12, FOLATE, FERRITIN, TIBC, IRON, RETICCTPCT in the last 72 hours. Urinalysis    Component Value Date/Time   COLORURINE STRAW (A) 12/22/2016 1529   APPEARANCEUR HAZY (A) 12/22/2016 1529   LABSPEC 1.011 12/22/2016 1529   PHURINE 5.0 12/22/2016 1529   GLUCOSEU NEGATIVE 12/22/2016 1529   HGBUR SMALL (A) 12/22/2016 1529   BILIRUBINUR NEGATIVE 12/22/2016 1529   KETONESUR NEGATIVE 12/22/2016 1529   PROTEINUR NEGATIVE 12/22/2016 1529   UROBILINOGEN 0.2 08/02/2015 0935   NITRITE NEGATIVE 12/22/2016 1529   LEUKOCYTESUR SMALL (A) 12/22/2016 1529   Sepsis Labs Invalid input(s): PROCALCITONIN,  WBC,  LACTICIDVEN Microbiology Recent Results (from the past 240 hour(s))  Blood Culture (routine x 2)     Status: None (Preliminary result)   Collection Time: 12/21/16  4:44 PM  Result Value Ref Range Status   Specimen Description BLOOD LEFT ANTECUBITAL  Final   Special Requests BOTTLES DRAWN AEROBIC AND ANAEROBIC 5CC  Final   Culture NO GROWTH 2 DAYS  Final   Report Status PENDING  Incomplete  Blood Culture (routine x 2)     Status: None (Preliminary result)   Collection Time: 12/21/16  4:45 PM  Result Value Ref Range Status   Specimen Description BLOOD RIGHT ARM  Final   Special Requests BOTTLES DRAWN AEROBIC AND ANAEROBIC 5CC  Final   Culture NO GROWTH 2 DAYS  Final   Report Status PENDING  Incomplete  MRSA PCR Screening     Status: None   Collection Time: 12/21/16  9:46 PM  Result Value Ref Range Status   MRSA by PCR NEGATIVE NEGATIVE Final    Comment:        The GeneXpert MRSA Assay (FDA approved for NASAL specimens only), is one component of a comprehensive MRSA colonization surveillance program. It is not intended to diagnose MRSA infection nor to guide or monitor  treatment for MRSA infections.     Time coordinating discharge: 33 minutes  SIGNED:  Irwin Brakeman, MD  [facility] 12/24/2016, 10:54 AM Pager   If 7PM-7AM, please contact night-coverage [email or web address] Password [email or web address]

## 2016-12-24 NOTE — Clinical Social Work Placement (Signed)
   CLINICAL SOCIAL WORK PLACEMENT  NOTE  Date:  12/24/2016  Patient Details  Name: Angelica Beck MRN: [id number] Date of Birth: 1951/02/22  Clinical Social Work is seeking post-discharge placement for this patient at the [place] level of care (*CSW will initial, date and re-position this form in  chart as items are completed):  Yes   Patient/family provided with [place] Work Department's list of facilities offering this level of care within the geographic area requested by the patient (or if unable, by the patient's family).  Yes   Patient/family informed of their freedom to choose among providers that offer the needed level of care, that participate in Medicare, Medicaid or managed care program needed by the patient, have an available bed and are willing to accept the patient.  Yes   Patient/family informed of [facility]'s ownership interest in [facility] and [facility], as well as of the fact that they are under no obligation to receive care at these facilities.  PASRR submitted to EDS on       PASRR number received on       Existing PASRR number confirmed on 12/24/16     FL2 transmitted to all facilities in geographic area requested by pt/family on       FL2 transmitted to all facilities within larger geographic area on       Patient informed that his/her managed care company has contracts with or will negotiate with certain facilities, including the following:        Yes   Patient/family informed of bed offers received.  Patient chooses bed at  (Pt from [facility])     Physician recommends and patient chooses bed at      Patient to be transferred to  (Pt from [facility]) on 12/24/16.  Patient to be transferred to facility by  [facility])     Patient family notified on 12/24/16 of transfer.  Name of family member notified:        PHYSICIAN Please sign FL2     Additional Comment:     _______________________________________________ Serafina Mitchell, [place] 12/24/2016, 12:21 PM

## 2016-12-24 NOTE — Clinical Social Work Note (Signed)
Clinical Social Worker facilitated patient discharge including contacting patient family and facility to confirm patient discharge plans.  Clinical information faxed to facility and family agreeable with plan.  CSW arranged ambulance transport via [facility] to [place]. RN to call report prior to discharge.  Clinical Social Worker will sign off for now as social work intervention is no longer needed. Please consult [place] again if new need arises.  Santhiago Collingsworth B. Joline Maxcy Clinical Social Work Dept Weekend Social Worker [phone] 12:20 PM

## 2016-12-25 LAB — GLUCOSE, CAPILLARY: Glucose-Capillary: 62 mg/dL — ABNORMAL LOW (ref 65–99)

## 2016-12-26 ENCOUNTER — Non-Acute Institutional Stay (SKILLED_NURSING_FACILITY): Payer: Medicare Other | Admitting: Internal Medicine

## 2016-12-26 ENCOUNTER — Observation Stay (HOSPITAL_COMMUNITY)
Admission: EM | Admit: 2016-12-26 | Discharge: 2016-12-28 | Disposition: A | Discharge status: Skilled Nursing Facility | Payer: Medicare Other | Attending: Internal Medicine | Admitting: Internal Medicine

## 2016-12-26 ENCOUNTER — Encounter (HOSPITAL_COMMUNITY): Payer: Self-pay

## 2016-12-26 ENCOUNTER — Encounter: Payer: Self-pay | Admitting: Internal Medicine

## 2016-12-26 DIAGNOSIS — N183 Chronic kidney disease, stage 3 unspecified: Secondary | ICD-10-CM | POA: Diagnosis present

## 2016-12-26 DIAGNOSIS — I69311 Memory deficit following cerebral infarction: Secondary | ICD-10-CM | POA: Insufficient documentation

## 2016-12-26 DIAGNOSIS — J209 Acute bronchitis, unspecified: Principal | ICD-10-CM | POA: Diagnosis present

## 2016-12-26 DIAGNOSIS — J9601 Acute respiratory failure with hypoxia: Secondary | ICD-10-CM | POA: Diagnosis not present

## 2016-12-26 DIAGNOSIS — E739 Lactose intolerance, unspecified: Secondary | ICD-10-CM | POA: Diagnosis not present

## 2016-12-26 DIAGNOSIS — I130 Hypertensive heart and chronic kidney disease with heart failure and stage 1 through stage 4 chronic kidney disease, or unspecified chronic kidney disease: Secondary | ICD-10-CM | POA: Diagnosis not present

## 2016-12-26 DIAGNOSIS — Z930 Tracheostomy status: Secondary | ICD-10-CM | POA: Insufficient documentation

## 2016-12-26 DIAGNOSIS — I5042 Chronic combined systolic (congestive) and diastolic (congestive) heart failure: Secondary | ICD-10-CM | POA: Diagnosis not present

## 2016-12-26 DIAGNOSIS — I10 Essential (primary) hypertension: Secondary | ICD-10-CM | POA: Diagnosis present

## 2016-12-26 DIAGNOSIS — R079 Chest pain, unspecified: Secondary | ICD-10-CM | POA: Diagnosis not present

## 2016-12-26 DIAGNOSIS — IMO0001 Reserved for inherently not codable concepts without codable children: Secondary | ICD-10-CM

## 2016-12-26 DIAGNOSIS — I5189 Other ill-defined heart diseases: Secondary | ICD-10-CM | POA: Diagnosis present

## 2016-12-26 DIAGNOSIS — I2510 Atherosclerotic heart disease of native coronary artery without angina pectoris: Secondary | ICD-10-CM | POA: Insufficient documentation

## 2016-12-26 DIAGNOSIS — Z87891 Personal history of nicotine dependence: Secondary | ICD-10-CM | POA: Insufficient documentation

## 2016-12-26 DIAGNOSIS — N179 Acute kidney failure, unspecified: Secondary | ICD-10-CM | POA: Diagnosis not present

## 2016-12-26 DIAGNOSIS — Z9189 Other specified personal risk factors, not elsewhere classified: Secondary | ICD-10-CM

## 2016-12-26 DIAGNOSIS — Z7951 Long term (current) use of inhaled steroids: Secondary | ICD-10-CM | POA: Insufficient documentation

## 2016-12-26 DIAGNOSIS — E0590 Thyrotoxicosis, unspecified without thyrotoxic crisis or storm: Secondary | ICD-10-CM | POA: Diagnosis not present

## 2016-12-26 DIAGNOSIS — E1122 Type 2 diabetes mellitus with diabetic chronic kidney disease: Secondary | ICD-10-CM | POA: Insufficient documentation

## 2016-12-26 DIAGNOSIS — Z7982 Long term (current) use of aspirin: Secondary | ICD-10-CM | POA: Insufficient documentation

## 2016-12-26 DIAGNOSIS — E782 Mixed hyperlipidemia: Secondary | ICD-10-CM | POA: Insufficient documentation

## 2016-12-26 DIAGNOSIS — Z794 Long term (current) use of insulin: Secondary | ICD-10-CM | POA: Diagnosis not present

## 2016-12-26 DIAGNOSIS — E669 Obesity, unspecified: Secondary | ICD-10-CM | POA: Insufficient documentation

## 2016-12-26 DIAGNOSIS — K5900 Constipation, unspecified: Secondary | ICD-10-CM | POA: Diagnosis not present

## 2016-12-26 DIAGNOSIS — Z955 Presence of coronary angioplasty implant and graft: Secondary | ICD-10-CM | POA: Insufficient documentation

## 2016-12-26 DIAGNOSIS — F0150 Vascular dementia without behavioral disturbance: Secondary | ICD-10-CM | POA: Diagnosis not present

## 2016-12-26 DIAGNOSIS — J189 Pneumonia, unspecified organism: Secondary | ICD-10-CM

## 2016-12-26 DIAGNOSIS — I129 Hypertensive chronic kidney disease with stage 1 through stage 4 chronic kidney disease, or unspecified chronic kidney disease: Secondary | ICD-10-CM

## 2016-12-26 DIAGNOSIS — J441 Chronic obstructive pulmonary disease with (acute) exacerbation: Secondary | ICD-10-CM | POA: Insufficient documentation

## 2016-12-26 DIAGNOSIS — Z6833 Body mass index (BMI) 33.0-33.9, adult: Secondary | ICD-10-CM | POA: Insufficient documentation

## 2016-12-26 DIAGNOSIS — J9621 Acute and chronic respiratory failure with hypoxia: Secondary | ICD-10-CM | POA: Diagnosis not present

## 2016-12-26 DIAGNOSIS — Z880 Allergy status to penicillin: Secondary | ICD-10-CM | POA: Insufficient documentation

## 2016-12-26 DIAGNOSIS — D649 Anemia, unspecified: Secondary | ICD-10-CM | POA: Diagnosis not present

## 2016-12-26 DIAGNOSIS — R4189 Other symptoms and signs involving cognitive functions and awareness: Secondary | ICD-10-CM | POA: Diagnosis present

## 2016-12-26 DIAGNOSIS — M170 Bilateral primary osteoarthritis of knee: Secondary | ICD-10-CM | POA: Diagnosis not present

## 2016-12-26 DIAGNOSIS — J440 Chronic obstructive pulmonary disease with acute lower respiratory infection: Secondary | ICD-10-CM | POA: Diagnosis not present

## 2016-12-26 DIAGNOSIS — J449 Chronic obstructive pulmonary disease, unspecified: Secondary | ICD-10-CM | POA: Diagnosis present

## 2016-12-26 HISTORY — DX: Vascular dementia, unspecified severity, without behavioral disturbance, psychotic disturbance, mood disturbance, and anxiety: F01.50

## 2016-12-26 LAB — CULTURE, BLOOD (ROUTINE X 2)
CULTURE: NO GROWTH
Culture: NO GROWTH

## 2016-12-26 LAB — BASIC METABOLIC PANEL
BUN: 61 mg/dL — AB (ref 4–21)
Creatinine: 2 mg/dL — AB (ref 0.5–1.1)
Glucose: 202 mg/dL
POTASSIUM: 4.2 mmol/L (ref 3.4–5.3)
Sodium: 134 mmol/L — AB (ref 137–147)

## 2016-12-26 LAB — CBC AND DIFFERENTIAL
HEMATOCRIT: 31 % — AB (ref 36–46)
Hemoglobin: 9.8 g/dL — AB (ref 12.0–16.0)
Platelets: 209 10*3/uL (ref 150–399)
WBC: 12.7 10^3/mL

## 2016-12-26 MED ORDER — METHYLPREDNISOLONE SODIUM SUCC 125 MG IJ SOLR
80.0000 mg | Freq: Once | INTRAMUSCULAR | Status: AC
  Administered 2016-12-27: 80 mg via INTRAVENOUS
  Filled 2016-12-26: qty 2

## 2016-12-26 NOTE — Assessment & Plan Note (Signed)
Continue pulmonary toilet Complete  full course of doxycycline

## 2016-12-26 NOTE — Assessment & Plan Note (Signed)
Dysphagia 2 diet

## 2016-12-26 NOTE — ED Provider Notes (Signed)
[facility] DEPT Provider Note   CSN: [id number] Arrival date & time: 12/26/16  2322  By signing my name below, I, Arianna Nassar, attest that this documentation has been prepared under the direction and in the presence of Merryl Hacker, MD.  Electronically Signed: Julien Nordmann, ED Scribe. 12/26/16. 11:55 PM.    History   Chief Complaint Chief Complaint  Patient presents with  . Shortness of Breath  . Chest Pain   The history is provided by the patient and the EMS personnel. The history is limited by the condition of the patient. No language interpreter was used.   LEVEL V CAVEAT: HPI and ROS limited due to the condition of the pt   HPI Comments: Angelica Beck is a 66 y.o. female who has a PMhx of DMII, HLD, HTN, CVA, and hyperthyroidism presents to the Emergency Department by ambulance complaining of acute onset, resolved chest pain with associated shortness of breath that began this evening. Per EMS, pt is a resident at [facility]. They were called out for pt having "crushing CP" and shortness of breath. She received 5 mg of albuterol and 324 mg of ASA by EMS which resolved her symptoms. She denies hx of CHF or COPD. Pt denies fever.  Of note, recent admission and discharged just several days ago for pneumonia. She was discharged with doxycycline. Due to finish on April 7.  Past Medical History:  Diagnosis Date  . Arthritis   . Asthma    09/07/16  . Diabetes mellitus, type 2 ([place])   . Hyperlipidemia   . Hypertension   . Hyperthyroidism   . Obesity hypoventilation syndrome ([place]) 06/20/2013  . Stroke [facility])     Patient Active Problem List   Diagnosis Date Noted  . Acute bronchitis 12/27/2016  . CKD (chronic kidney disease) stage 3, GFR 30-59 ml/min 12/27/2016  . PNA (pneumonia) 12/21/2016  . Edema 11/02/2016  . Diastolic dysfunction 19/62/2297  . Osteoarthritis of both knees Aug 07, 202018  . At risk for aspiration 10/03/2016  . Normocytic normochromic anemia  09/07/2016  . Acute on chronic respiratory failure ([place])   . (HFpEF) heart failure with preserved ejection fraction ([place])   . Enteritis due to Clostridium difficile   . SBO (small bowel obstruction) 08/10/2016  . Collapse of left lung   . S/P percutaneous endoscopic gastrostomy (PEG) tube placement ([place])   . Tracheostomy status ([place])   . HCAP (healthcare-associated pneumonia) 07/16/2016  . Encephalopathy   . Palliative care encounter   . Pressure injury of skin 06/24/2016  . Acute respiratory failure ([place]) 06/21/2016  . Septic shock ([place]) 06/20/2016  . Chronic respiratory failure with hypoxia ([place]) 11/29/2015  . Essential hypertension   . Lactic acidosis 08/02/2015  . History of CVA (cerebrovascular accident) 08/02/2015  . Chronic systolic HF (heart failure) ([place]) 08/02/2015  . Cognitive impairment 08/02/2015  . AKI (acute kidney injury) ([place]) 02/08/2015  . Abnormality of gait 02/08/2015  . Hyperthyroidism 02/08/2015  . DM2 (diabetes mellitus, type 2) ([place]) 02/08/2015  . Obesity hypoventilation syndrome ([place]) 06/20/2013  . Hyperglycemia 06/28/2009  . HYPERLIPIDEMIA-MIXED 06/28/2009  . HYPERTENSION, BENIGN 06/28/2009  . CAD, NATIVE VESSEL 06/28/2009  . DYSPNEA 06/28/2009    Past Surgical History:  Procedure Laterality Date  . IR GENERIC HISTORICAL  07/12/2016   IR US GUIDE VASC ACCESS RIGHT 07/12/2016 [name], MD [facility]  . IR GENERIC HISTORICAL  07/12/2016   IR FLUORO GUIDE CV LINE RIGHT 07/12/2016 [name], MD [facility]  . IR GENERIC  HISTORICAL  07/17/2016   IR GASTROSTOMY TUBE MOD SED 07/17/2016 [name], MD [facility]  . IR GENERIC HISTORICAL  08/23/2016   IR REMOVAL TUN CV CATH W/O FL 08/23/2016 [name], MD [facility]  . IR GENERIC HISTORICAL  11/09/2016   IR GASTROSTOMY TUBE REMOVAL 11/09/2016 [name], PA-C [facility]  . TRACHEOSTOMY TUBE PLACEMENT N/A 07/14/2016   Procedure: TRACHEOSTOMY, THYROID ISTHMUSECTOMY;   Surgeon: [name], MD;  Location: [facility];  Service: ENT;  Laterality: N/A;  . VENTRAL HERNIA REPAIR      OB History    No data available       Home Medications    Prior to Admission medications   Medication Sig Start Date End Date Taking? Authorizing Provider  acetaminophen (TYLENOL) 500 MG tablet Take 500 mg by mouth every 6 (six) hours as needed for mild pain.    Yes Historical Provider, MD  amLODipine (NORVASC) 5 MG tablet Take 1 tablet (5 mg total) by mouth daily. 12/24/16  Yes Clanford Marisa Hua, MD  aspirin 81 MG chewable tablet Chew 81 mg by mouth daily.   Yes Historical Provider, MD  atorvastatin (LIPITOR) 40 MG tablet Take 40 mg by mouth every evening.    Yes Historical Provider, MD  b complex-vitamin c-folic acid (NEPHRO-VITE) 0.8 MG TABS tablet Take 1 tablet by mouth every morning.   Yes Historical Provider, MD  bisacodyl (DULCOLAX) 10 MG suppository Place 10 mg rectally daily as needed for moderate constipation.   Yes Historical Provider, MD  budesonide (PULMICORT) 0.5 MG/2ML nebulizer solution Take 0.5 mg by nebulization 2 (two) times daily.   Yes Historical Provider, MD  cholecalciferol (VITAMIN D) 1000 units tablet Take 1,000 Units by mouth daily.   Yes Historical Provider, MD  docusate sodium (COLACE) 100 MG capsule Take 100 mg by mouth daily.   Yes Historical Provider, MD  doxycycline (VIBRA-TABS) 100 MG tablet Take 1 tablet (100 mg total) by mouth every 12 (twelve) hours. Take for 5 days then STOP 12/30/16 12/25/16 12/30/16 Yes Clanford L Wynetta Emery, MD  famotidine (PEPCID) 20 MG tablet Take 20 mg by mouth daily.   Yes Historical Provider, MD  ferrous sulfate 325 (65 FE) MG tablet Take 1 tablet (325 mg total) by mouth daily with breakfast. 12/24/16  Yes Clanford Marisa Hua, MD  furosemide (LASIX) 40 MG tablet Take 1 tablet (40 mg total) by mouth every other day. Patient taking differently: Take 40 mg by mouth daily.  12/24/16  Yes Clanford Marisa Hua, MD  insulin glargine (LANTUS) 100  UNIT/ML injection Inject 0.05 mLs (5 Units total) into the skin at bedtime. 12/24/16  Yes Clanford Marisa Hua, MD  ipratropium-albuterol (DUONEB) 0.5-2.5 (3) MG/3ML SOLN Take 3 mLs by nebulization every 4 (four) hours as needed (for dyspnea).   Yes Historical Provider, MD  magnesium hydroxide (MILK OF MAGNESIA) 400 MG/5ML suspension Take 30 mLs by mouth daily as needed for mild constipation.   Yes Historical Provider, MD  Melatonin 3 MG TABS Take 3 mg by mouth at bedtime.    Yes Historical Provider, MD  memantine (NAMENDA) 10 MG tablet Take 10 mg by mouth 2 (two) times daily.    Yes Historical Provider, MD  methimazole (TAPAZOLE) 10 MG tablet Take 10 mg by mouth daily.   Yes Historical Provider, MD  metoprolol (LOPRESSOR) 50 MG tablet Take 50 mg by mouth 2 (two) times daily.   Yes Historical Provider, MD  ondansetron (ZOFRAN) 4 MG tablet Take 4 mg by  mouth every 8 (eight) hours as needed for nausea or vomiting.   Yes Historical Provider, MD  saccharomyces boulardii (FLORASTOR) 250 MG capsule Take 250 mg by mouth daily as needed (while taking antibiotics).    Yes Historical Provider, MD  Sodium Phosphates (RA SALINE ENEMA RE) Place 1 Applicatorful rectally daily as needed (constipation).   Yes Historical Provider, MD  trolamine salicylate (ASPERCREME) 10 % cream Apply 1 application topically 2 (two) times daily.    Yes Historical Provider, MD    Family History Family History  Problem Relation Age of Onset  . Diabetes Mother   . Cancer Neg Hx   . Heart disease Neg Hx   . Stroke Neg Hx     Social History Social History  Substance Use Topics  . Smoking status: Former Smoker    Packs/day: 0.25    Years: 30.00    Types: Cigarettes    Quit date: 09/25/2002  . Smokeless tobacco: Never Used  . Alcohol use No     Allergies   Penicillins and Lactose intolerance (gi)   Review of Systems Review of Systems  Unable to perform ROS: Dementia  Constitutional: Negative for fever.  Respiratory:  Positive for shortness of breath.   Cardiovascular: Positive for chest pain.  All other systems reviewed and are negative.    Physical Exam Updated Vital Signs BP (!) 103/59   Pulse 90   Temp 98.7 F (37.1 C)   Resp (!) 21   SpO2 100%   Physical Exam  Constitutional:  Obese, chronically ill-appearing, no acute distress  HENT:  Head: Normocephalic and atraumatic.  Eyes: Pupils are equal, round, and reactive to light.  Cardiovascular: Normal rate, regular rhythm and normal heart sounds.   Pulmonary/Chest: Effort normal. No respiratory distress. She has wheezes.  Diffuse expiratory wheezing  Abdominal: Soft. Bowel sounds are normal. There is no tenderness.  Musculoskeletal:  Trace bilateral lower extremity edema  Neurological: She is alert.  Does not contribute to history taking regularly, left-sided weakness noted  Skin: Skin is warm and dry.  Psychiatric: She has a normal mood and affect.  Nursing note and vitals reviewed.    ED Treatments / Results  DIAGNOSTIC STUDIES: Oxygen Saturation is 97% on RA, normal by my interpretation.  COORDINATION OF CARE:  11:53 PM Discussed treatment plan with pt at bedside and pt agreed to plan.  Labs (all labs ordered are listed, but only abnormal results are displayed) Labs Reviewed  BASIC METABOLIC PANEL - Abnormal; Notable for the following:       Result Value   Glucose, Bld 240 (*)    BUN 54 (*)    Creatinine, Ser 2.36 (*)    GFR calc non Af Amer 21 (*)    GFR calc Af Amer 24 (*)    All other components within normal limits  CBC - Abnormal; Notable for the following:    WBC 12.4 (*)    RBC 3.53 (*)    Hemoglobin 9.6 (*)    HCT 29.5 (*)    All other components within normal limits  BRAIN NATRIURETIC PEPTIDE  I-STAT TROPOININ, ED  I-STAT TROPOININ, ED    EKG  EKG Interpretation  Date/Time:  Tuesday December 26 2016 23:35:08 EDT Ventricular Rate:  98 PR Interval:    QRS Duration: 95 QT Interval:  378 QTC  Calculation: 483 R Axis:   -24 Text Interpretation:  Sinus rhythm Consider left atrial enlargement Borderline left axis deviation Low voltage, precordial leads Artifact in  lead(s) I III aVR aVL V1 V2 Confirmed by Dina Rich  MD, COURTNEY ([id number]) on 12/26/2016 11:54:10 PM       Radiology Dg Chest 2 View  Result Date: 12/27/2016 CLINICAL DATA:  Chest pain and dyspnea tonight EXAM: CHEST  2 VIEW COMPARISON:  12/23/2016 FINDINGS: The lungs are clear. The pulmonary vasculature is normal. Heart size is normal. Hilar and mediastinal contours are unremarkable. There is no pleural effusion. IMPRESSION: No active cardiopulmonary disease. Electronically Signed   By: Andreas Newport M.D.   On: 12/27/2016 00:13    Procedures Procedures (including critical care time)  Medications Ordered in ED Medications  insulin glargine (LANTUS) injection 5 Units (not administered)  methimazole (TAPAZOLE) tablet 10 mg (not administered)  metoprolol tartrate (LOPRESSOR) tablet 50 mg (not administered)  ondansetron (ZOFRAN) tablet 4 mg (not administered)  saccharomyces boulardii (FLORASTOR) capsule 250 mg (not administered)  memantine (NAMENDA) tablet 10 mg (not administered)  magnesium hydroxide (MILK OF MAGNESIA) suspension 30 mL (not administered)  ipratropium-albuterol (DUONEB) 0.5-2.5 (3) MG/3ML nebulizer solution 3 mL (not administered)  docusate sodium (COLACE) capsule 100 mg (not administered)  doxycycline (VIBRA-TABS) tablet 100 mg (not administered)  famotidine (PEPCID) tablet 20 mg (not administered)  bisacodyl (DULCOLAX) suppository 10 mg (not administered)  budesonide (PULMICORT) nebulizer solution 0.5 mg (not administered)  cholecalciferol (VITAMIN D) tablet 1,000 Units (not administered)  amLODipine (NORVASC) tablet 5 mg (not administered)  aspirin chewable tablet 81 mg (not administered)  atorvastatin (LIPITOR) tablet 40 mg (not administered)  multivitamin (RENA-VIT) tablet 1 tablet (not  administered)  acetaminophen (TYLENOL) tablet 500 mg (not administered)  predniSONE (DELTASONE) tablet 40 mg (not administered)  insulin aspart (novoLOG) injection 0-15 Units (not administered)  methylPREDNISolone sodium succinate (SOLU-MEDROL) 125 mg/2 mL injection 80 mg (80 mg Intravenous Given 12/27/16 0226)  ipratropium-albuterol (DUONEB) 0.5-2.5 (3) MG/3ML nebulizer solution 3 mL (3 mLs Nebulization Given 12/27/16 0226)  albuterol (PROVENTIL,VENTOLIN) solution continuous neb (15 mg/hr Nebulization Given 12/27/16 0351)     Initial Impression / Assessment and Plan / ED Course  I have reviewed the triage vital signs and the nursing notes.  Pertinent labs & imaging results that were available during my care of the patient were reviewed by me and considered in my medical decision making (see chart for details).     Patient presents with shortness of breath. Denies chest pain but was reported prior to arrival. She provides very limited history. Recent admission for pneumonia. She is still on antibiotics. It does not appear that she has continued prednisone. She was given Solu-Medrol and a DuoNeb. She reports improvement after initial DuoNeb. However on multiple repeat evaluations she appears more to With wheezing. She was provided a continuous DuoNeb. Chest x-ray is reassuring. Lab work notable for worsening kidney function. She was started back on her Lasix every other day as she appeared to be volume overloaded during prior admission. Given chronic medical comorbidities and worsening shortness of breath, will admit for presumed COPD exacerbation.   Final Clinical Impressions(s) / ED Diagnoses   Final diagnoses:  Acute on chronic respiratory failure with hypoxia ([place])  AKI (acute kidney injury) ([place])   I personally performed the services described in this documentation, which was scribed in my presence. The recorded information has been reviewed and is accurate.  New Prescriptions New  Prescriptions   No medications on file     Merryl Hacker, MD 12/27/16 [phone]

## 2016-12-26 NOTE — ED Notes (Signed)
Attempted IV stick x 2  

## 2016-12-26 NOTE — Progress Notes (Signed)
This is a nursing facility follow up for [facility] readmission within 30 days  Interim medical record and care since last [facility] visit was updated with review of diagnostic studies and change in clinical status since last visit were documented.  HPI: Patient was admitted 3/29-12/24/16 with pneumonia. It was feared that the patient would be unable to protect her airway. The clinical history appeared to suggest possible aspiration as symptoms began after eating [employer] candies. She had prior complicated hx of CAP requiring mechanical ventilation and tracheostomy. She was admitted to the SNF and had repeated ED admissions for removing the tracheostomy tube. She was followed in the trach clinic and the trach tube removed 10/04/16. She received parenteral  Vancomycin & Cefipime  initially but was transitioned to oral doxycycline. She also received aggressive pulmonary toilet and she was discharged to the SNF to complete the doxycycline. At discharge labs revealed creatinine of 1.79 and GFR 33. WBC had dropped from 11,800 to 8,000. She had a normochromic, normocytic anemia with hemoglobin 8.9 and hematocrit 27.8. Chest x-ray was reviewed personally,ATX vs versus infiltrate in the left retrocardiac area. Serially there had been significant improvement in the radiographic pneumonia   Review of systems: Dementia invalidated responses.  Unable to name date, location, or president.    Continues to report shortness of breath.    Constitutional: No fever,significant weight change, fatigue  Eyes: No redness, discharge, pain, vision change ENT/mouth: No nasal congestion,  purulent discharge, earache,change in hearing ,sore throat  Cardiovascular: No chest pain, palpitations,paroxysmal nocturnal dyspnea, claudication Respiratory: No cough, sputum production,hemoptysis, DOE , significant snoring,apnea  Gastrointestinal: No heartburn,dysphagia,abdominal pain, nausea / vomiting,rectal bleeding,  melena,change in bowels Genitourinary: No dysuria,hematuria, pyuria,  incontinence, nocturia Musculoskeletal: No joint stiffness, joint swelling, weakness,pain Dermatologic: No rash, pruritus, change in appearance of skin Neurologic: No dizziness,headache,syncope, seizures, numbness , tingling Psychiatric: No significant anxiety , depression, insomnia, anorexia Endocrine: No change in hair/skin/ nails, excessive thirst, excessive hunger, excessive urination  Hematologic/lymphatic: No significant bruising, lymphadenopathy,abnormal bleeding Allergy/immunology: No itchy/ watery eyes, significant sneezing, urticaria, angioedema  Physical exam:  Pertinent or positive findings: Alert and oriented to self only.  Repetitive mastication. Teeth absent.   Hirsutism of chin Thyromegaly present without nodularity. Heart sounds distant with tachycardia present.  Rhonchi present bilaterally, improves with cough. Lower extremity edema 1+ bilaterally, slightly greater in right.  Decreased right pedal pulse.    General appearance:Adequately nourished; no acute distres , increased work of breathing is present.   Lymphatic: No lymphadenopathy about the head, neck, axilla . Eyes: No conjunctival inflammation or lid edema is present. There is no scleral icterus. Ears:  External ear exam shows no significant lesions or deformities.   Nose:  External nasal examination shows no deformity or inflammation. Nasal mucosa are pink and moist without lesions ,exudates Oral exam: lips and gums are healthy appearing.There is no oropharyngeal erythema or exudate . Neck:  No thyromegaly, masses, tenderness noted.    Heart:   regular rhythm. S1 and S2 normal without gallop, murmur, click, rub .  Lungs:Chest  without wheezes, rales, rubs. Abdomen:Bowel sounds are normal. Abdomen is soft and nontender with no organomegaly, hernias,masses. GU: deferred  Extremities:  No cyanosis, clubbing  Neurologic exam : Cn 2-7  intact Strength equal  in upper & lower extremities Skin: Warm & dry w/o tenting. No significant lesions or rash.  See summary under each active problem in the Problem List with associated updated therapeutic plan  #1) Pneumonia- continue Doxycycline  until 12/30/16.  Assist with incentive spirometry, cough, and deep breathIng if patient can comply.  Continue nebulizer treatments as needed.  Oxygen currently 94% on 2L via [place].    #2) Dementia- patient will require assistance and prompting with pulmonary toilet.  Concern for her ability to protect airway and prevent aspiration.  Continue dysphagia diet.   #3) Diastolic heart failure- with lower extremity edema, continue on Lasix '40mg'$  every other day and monitor for changes in weight.

## 2016-12-26 NOTE — Patient Instructions (Signed)
See assessment and plan under each diagnosis in the problem list and acutely for this visit 

## 2016-12-26 NOTE — ED Triage Notes (Signed)
Pt comes from [facility] via [facility], called out for crushing CP, pt was SOB, recently diagnosed with pneumonia. Wheezing in all lobes. PTA received 324 ASA and '5mg'$  albuterol> CP is now resolved

## 2016-12-27 ENCOUNTER — Encounter (HOSPITAL_COMMUNITY): Payer: Self-pay | Admitting: *Deleted

## 2016-12-27 ENCOUNTER — Emergency Department (HOSPITAL_COMMUNITY): Payer: Medicare Other

## 2016-12-27 DIAGNOSIS — N183 Chronic kidney disease, stage 3 unspecified: Secondary | ICD-10-CM | POA: Diagnosis present

## 2016-12-27 DIAGNOSIS — J209 Acute bronchitis, unspecified: Secondary | ICD-10-CM | POA: Diagnosis not present

## 2016-12-27 DIAGNOSIS — J449 Chronic obstructive pulmonary disease, unspecified: Secondary | ICD-10-CM | POA: Diagnosis present

## 2016-12-27 DIAGNOSIS — N179 Acute kidney failure, unspecified: Secondary | ICD-10-CM | POA: Diagnosis not present

## 2016-12-27 DIAGNOSIS — R079 Chest pain, unspecified: Secondary | ICD-10-CM | POA: Diagnosis not present

## 2016-12-27 DIAGNOSIS — I519 Heart disease, unspecified: Secondary | ICD-10-CM | POA: Diagnosis not present

## 2016-12-27 DIAGNOSIS — I10 Essential (primary) hypertension: Secondary | ICD-10-CM | POA: Diagnosis not present

## 2016-12-27 DIAGNOSIS — J9621 Acute and chronic respiratory failure with hypoxia: Secondary | ICD-10-CM | POA: Diagnosis not present

## 2016-12-27 DIAGNOSIS — E1121 Type 2 diabetes mellitus with diabetic nephropathy: Secondary | ICD-10-CM | POA: Diagnosis not present

## 2016-12-27 DIAGNOSIS — R4189 Other symptoms and signs involving cognitive functions and awareness: Secondary | ICD-10-CM | POA: Diagnosis not present

## 2016-12-27 LAB — CBC
HEMATOCRIT: 29.5 % — AB (ref 36.0–46.0)
HEMOGLOBIN: 9.6 g/dL — AB (ref 12.0–15.0)
MCH: 27.2 pg (ref 26.0–34.0)
MCHC: 32.5 g/dL (ref 30.0–36.0)
MCV: 83.6 fL (ref 78.0–100.0)
Platelets: 241 10*3/uL (ref 150–400)
RBC: 3.53 MIL/uL — ABNORMAL LOW (ref 3.87–5.11)
RDW: 14.9 % (ref 11.5–15.5)
WBC: 12.4 10*3/uL — ABNORMAL HIGH (ref 4.0–10.5)

## 2016-12-27 LAB — I-STAT TROPONIN, ED
Troponin i, poc: 0 ng/mL (ref 0.00–0.08)
Troponin i, poc: 0.01 ng/mL (ref 0.00–0.08)

## 2016-12-27 LAB — BASIC METABOLIC PANEL
ANION GAP: 9 (ref 5–15)
BUN: 54 mg/dL — AB (ref 6–20)
CHLORIDE: 102 mmol/L (ref 101–111)
CO2: 24 mmol/L (ref 22–32)
Calcium: 9.7 mg/dL (ref 8.9–10.3)
Creatinine, Ser: 2.36 mg/dL — ABNORMAL HIGH (ref 0.44–1.00)
GFR calc Af Amer: 24 mL/min — ABNORMAL LOW (ref >60)
GFR calc non Af Amer: 21 mL/min — ABNORMAL LOW (ref >60)
Glucose, Bld: 240 mg/dL — ABNORMAL HIGH (ref 65–99)
POTASSIUM: 4.1 mmol/L (ref 3.5–5.1)
Sodium: 135 mmol/L (ref 135–145)

## 2016-12-27 LAB — CBG MONITORING, ED
GLUCOSE-CAPILLARY: 387 mg/dL — AB (ref 65–99)
Glucose-Capillary: 405 mg/dL — ABNORMAL HIGH (ref 65–99)

## 2016-12-27 LAB — GLUCOSE, CAPILLARY
GLUCOSE-CAPILLARY: 167 mg/dL — AB (ref 65–99)
Glucose-Capillary: 306 mg/dL — ABNORMAL HIGH (ref 65–99)

## 2016-12-27 LAB — BRAIN NATRIURETIC PEPTIDE: B Natriuretic Peptide: 22.6 pg/mL (ref 0.0–100.0)

## 2016-12-27 MED ORDER — INSULIN GLARGINE 100 UNIT/ML ~~LOC~~ SOLN
5.0000 [IU] | Freq: Every day | SUBCUTANEOUS | Status: DC
  Administered 2016-12-27 – 2016-12-28 (×2): 5 [IU] via SUBCUTANEOUS
  Filled 2016-12-27 (×2): qty 0.05

## 2016-12-27 MED ORDER — MAGNESIUM HYDROXIDE 400 MG/5ML PO SUSP: 30.0000 mL | Freq: Every day | ORAL | Status: DC | PRN

## 2016-12-27 MED ORDER — HEPARIN SODIUM (PORCINE) 5000 UNIT/ML IJ SOLN
5000.0000 [IU] | Freq: Three times a day (TID) | INTRAMUSCULAR | Status: DC
  Administered 2016-12-27 – 2016-12-28 (×3): 5000 [IU] via SUBCUTANEOUS
  Filled 2016-12-27 (×3): qty 1

## 2016-12-27 MED ORDER — ALBUTEROL (5 MG/ML) CONTINUOUS INHALATION SOLN
15.0000 mg/h | INHALATION_SOLUTION | Freq: Once | RESPIRATORY_TRACT | Status: AC
  Administered 2016-12-27: 15 mg/h via RESPIRATORY_TRACT
  Filled 2016-12-27: qty 20

## 2016-12-27 MED ORDER — INSULIN GLARGINE 100 UNIT/ML ~~LOC~~ SOLN
5.0000 [IU] | Freq: Once | SUBCUTANEOUS | Status: AC
  Administered 2016-12-27: 5 [IU] via SUBCUTANEOUS
  Filled 2016-12-27: qty 0.05

## 2016-12-27 MED ORDER — VITAMIN D 1000 UNITS PO TABS
1000.0000 [IU] | ORAL_TABLET | Freq: Every day | ORAL | Status: DC
  Administered 2016-12-27 – 2016-12-28 (×2): 1000 [IU] via ORAL
  Filled 2016-12-27 (×2): qty 1

## 2016-12-27 MED ORDER — AMLODIPINE BESYLATE 5 MG PO TABS
5.0000 mg | ORAL_TABLET | Freq: Every day | ORAL | Status: DC
  Administered 2016-12-27 – 2016-12-28 (×2): 5 mg via ORAL
  Filled 2016-12-27 (×2): qty 1

## 2016-12-27 MED ORDER — METOPROLOL TARTRATE 50 MG PO TABS
50.0000 mg | ORAL_TABLET | Freq: Two times a day (BID) | ORAL | Status: DC
  Administered 2016-12-27 – 2016-12-28 (×4): 50 mg via ORAL
  Filled 2016-12-27: qty 2
  Filled 2016-12-27 (×3): qty 1

## 2016-12-27 MED ORDER — ONDANSETRON HCL 4 MG PO TABS: 4.0000 mg | ORAL_TABLET | Freq: Three times a day (TID) | ORAL | Status: DC | PRN

## 2016-12-27 MED ORDER — DOCUSATE SODIUM 100 MG PO CAPS
100.0000 mg | ORAL_CAPSULE | Freq: Every day | ORAL | Status: DC
  Administered 2016-12-27 – 2016-12-28 (×2): 100 mg via ORAL
  Filled 2016-12-27 (×2): qty 1

## 2016-12-27 MED ORDER — ATORVASTATIN CALCIUM 40 MG PO TABS
40.0000 mg | ORAL_TABLET | Freq: Every evening | ORAL | Status: DC
  Administered 2016-12-27 – 2016-12-28 (×2): 40 mg via ORAL
  Filled 2016-12-27 (×3): qty 1

## 2016-12-27 MED ORDER — METHIMAZOLE 5 MG PO TABS
10.0000 mg | ORAL_TABLET | Freq: Every day | ORAL | Status: DC
  Administered 2016-12-28: 10 mg via ORAL
  Filled 2016-12-27: qty 1
  Filled 2016-12-27: qty 2

## 2016-12-27 MED ORDER — RENA-VITE PO TABS
1.0000 | ORAL_TABLET | Freq: Every day | ORAL | Status: DC
  Administered 2016-12-27 – 2016-12-28 (×2): 1 via ORAL
  Filled 2016-12-27 (×2): qty 1

## 2016-12-27 MED ORDER — INSULIN ASPART 100 UNIT/ML ~~LOC~~ SOLN
0.0000 [IU] | Freq: Three times a day (TID) | SUBCUTANEOUS | Status: DC
  Administered 2016-12-27 (×2): 15 [IU] via SUBCUTANEOUS
  Administered 2016-12-27: 11 [IU] via SUBCUTANEOUS
  Administered 2016-12-28: 3 [IU] via SUBCUTANEOUS
  Administered 2016-12-28: 5 [IU] via SUBCUTANEOUS
  Filled 2016-12-27 (×2): qty 1

## 2016-12-27 MED ORDER — IPRATROPIUM-ALBUTEROL 0.5-2.5 (3) MG/3ML IN SOLN
3.0000 mL | Freq: Once | RESPIRATORY_TRACT | Status: AC
  Administered 2016-12-27: 3 mL via RESPIRATORY_TRACT
  Filled 2016-12-27: qty 3

## 2016-12-27 MED ORDER — ASPIRIN 81 MG PO CHEW
81.0000 mg | CHEWABLE_TABLET | Freq: Every day | ORAL | Status: DC
  Administered 2016-12-27 – 2016-12-28 (×2): 81 mg via ORAL
  Filled 2016-12-27 (×2): qty 1

## 2016-12-27 MED ORDER — BUDESONIDE 0.5 MG/2ML IN SUSP
0.5000 mg | Freq: Two times a day (BID) | RESPIRATORY_TRACT | Status: DC
  Administered 2016-12-27 – 2016-12-28 (×4): 0.5 mg via RESPIRATORY_TRACT
  Filled 2016-12-27 (×4): qty 2

## 2016-12-27 MED ORDER — BISACODYL 10 MG RE SUPP: 10.0000 mg | Freq: Every day | RECTAL | Status: DC | PRN

## 2016-12-27 MED ORDER — IPRATROPIUM-ALBUTEROL 0.5-2.5 (3) MG/3ML IN SOLN
3.0000 mL | RESPIRATORY_TRACT | Status: DC | PRN
  Administered 2016-12-28: 3 mL via RESPIRATORY_TRACT
  Filled 2016-12-27: qty 3

## 2016-12-27 MED ORDER — SACCHAROMYCES BOULARDII 250 MG PO CAPS
250.0000 mg | ORAL_CAPSULE | Freq: Every day | ORAL | Status: DC
  Administered 2016-12-28: 250 mg via ORAL
  Filled 2016-12-27 (×2): qty 1

## 2016-12-27 MED ORDER — MEMANTINE HCL 10 MG PO TABS
10.0000 mg | ORAL_TABLET | Freq: Two times a day (BID) | ORAL | Status: DC
  Administered 2016-12-27 – 2016-12-28 (×3): 10 mg via ORAL
  Filled 2016-12-27 (×4): qty 1

## 2016-12-27 MED ORDER — DOXYCYCLINE HYCLATE 100 MG PO TABS
100.0000 mg | ORAL_TABLET | Freq: Two times a day (BID) | ORAL | Status: DC
  Administered 2016-12-27 – 2016-12-28 (×4): 100 mg via ORAL
  Filled 2016-12-27 (×4): qty 1

## 2016-12-27 MED ORDER — FAMOTIDINE 20 MG PO TABS
20.0000 mg | ORAL_TABLET | Freq: Every day | ORAL | Status: DC
  Administered 2016-12-27 – 2016-12-28 (×2): 20 mg via ORAL
  Filled 2016-12-27 (×2): qty 1

## 2016-12-27 MED ORDER — PREDNISONE 20 MG PO TABS
40.0000 mg | ORAL_TABLET | Freq: Every day | ORAL | Status: DC
  Administered 2016-12-27 – 2016-12-28 (×2): 40 mg via ORAL
  Filled 2016-12-27 (×2): qty 2

## 2016-12-27 MED ORDER — ACETAMINOPHEN 500 MG PO TABS
500.0000 mg | ORAL_TABLET | Freq: Four times a day (QID) | ORAL | Status: DC | PRN
  Administered 2016-12-27: 500 mg via ORAL
  Filled 2016-12-27: qty 1

## 2016-12-27 NOTE — Progress Notes (Signed)
CAT STARTED

## 2016-12-27 NOTE — ED Notes (Signed)
Meal delivered is high in carbs.  [facility] notified and will send new tray.

## 2016-12-27 NOTE — Progress Notes (Signed)
12/27/2016 1440 Received pt to room 2W13 from ED.  Pt is Alert and without C/O.  Tele monitor applied and CCMD notified.  Oriented to room, call light and bed.  Call bell in reach. [place]

## 2016-12-27 NOTE — ED Notes (Signed)
Pt CBG 387. Nurse was notified.

## 2016-12-27 NOTE — ED Notes (Signed)
Pt sitting up in bed, breathing heavy, holding chest.  Lung sounds clear, pain is reproducible - increases with breathing and palpation.

## 2016-12-27 NOTE — Progress Notes (Signed)
Patient admitted after midnight, please see H&P.  Just recently hospitalized in early April with PNA.  She went back to [place].  Presented again to the ER with CP/SOB although patient denied symptoms to nursing after she arrived.  Patient has vascular dementia.  Had trach until January of 2018.  Patient not currently wheezing but she is sleeping and producing "upper airway sounds" suspect due to prior trach.  Will continue to monitor closely.  May benefit from palliative care consult depending on recovery--- 3rd hospitalization in last 6 months (7 ER visits).  Eulogio Bear DO

## 2016-12-27 NOTE — ED Notes (Signed)
Hospitalist at bedside 

## 2016-12-27 NOTE — ED Notes (Signed)
Called staff at [facility].  They stated pt is normally with flat affect, "dull".  In NOT on a dysphagia diet currently, though has been in the past.

## 2016-12-27 NOTE — ED Notes (Signed)
Breakfast ordered 

## 2016-12-27 NOTE — H&P (Signed)
History and Physical    Angelica Beck OZD:[id number] DOB: Jul 22, 1951 DOA: 12/26/2016  PCP: Delia Chimes, NP (Inactive)  Patient coming from: [facility]  I have personally briefly reviewed patient's old medical records in [place]  Chief Complaint: SOB  HPI: Angelica Beck is a 66 y.o. female with medical history significant of DM2, HTN, prior stroke and vascular dementia.  Patient presents to the ED via EMS with c/o acute onset, resolved CP with associated ongoing SOB that began this evening.  Per EMS patient lives at [place] and EMS was called out for patient having "crushing CP" and SOB.  '5mg'$  of albuterol and ASA and symptoms have resolved.  Patient isnt the greatest historian secondary to vascular dementia and deficits from prior stroke.  Of note patient had just been admitted and discharged by our service a couple of days ago for PNA.  Discharged on doxycycline.  ED Course: Wheezing is noted on exam, CXR actually shows resolution of the PNA seen on 3/29 film and is clear today.  She is given solumedrol and continuous breathing treatment while in ED.   Review of Systems: Unable to perform due to dementia  Past Medical History:  Diagnosis Date  . Arthritis   . Asthma    09/07/16  . Diabetes mellitus, type 2 ([place])   . Hyperlipidemia   . Hypertension   . Hyperthyroidism   . Obesity hypoventilation syndrome ([place]) 06/20/2013  . Stroke [facility])     Past Surgical History:  Procedure Laterality Date  . IR GENERIC HISTORICAL  07/12/2016   IR US GUIDE VASC ACCESS RIGHT 07/12/2016 [name], MD [facility]  . IR GENERIC HISTORICAL  07/12/2016   IR FLUORO GUIDE CV LINE RIGHT 07/12/2016 [name], MD [facility]  . IR GENERIC HISTORICAL  07/17/2016   IR GASTROSTOMY TUBE MOD SED 07/17/2016 [name], MD [facility]  . IR GENERIC HISTORICAL  08/23/2016   IR REMOVAL TUN CV CATH W/O FL 08/23/2016 [name], MD [facility]  . IR GENERIC HISTORICAL   11/09/2016   IR GASTROSTOMY TUBE REMOVAL 11/09/2016 [name], PA-C [facility]  . TRACHEOSTOMY TUBE PLACEMENT N/A 07/14/2016   Procedure: TRACHEOSTOMY, THYROID ISTHMUSECTOMY;  Surgeon: [name], MD;  Location: [facility];  Service: ENT;  Laterality: N/A;  . [place]       reports that she quit smoking about 14 years ago. Her smoking use included Cigarettes. She has a 7.50 pack-year smoking history. She has never used smokeless tobacco. She reports that she does not drink alcohol or use drugs.  Allergies  Allergen Reactions  . Penicillins Swelling    FACIAL SWELLING Has patient had a PCN reaction causing immediate rash, facial/tongue/throat swelling, SOB or lightheadedness with hypotension: Yes Has patient had a PCN reaction causing severe rash involving mucus membranes or skin necrosis: No Has patient had a PCN reaction that required hospitalization: No Has patient had a PCN reaction occurring within the last 10 years: No If all of the above answers are "NO", then may proceed with Cephalosporin use.   . Lactose Intolerance (Gi) Diarrhea and Nausea And Vomiting    Family History  Problem Relation Age of Onset  . Diabetes Mother   . Cancer Neg Hx   . Heart disease Neg Hx   . Stroke Neg Hx      Prior to Admission medications   Medication Sig Start Date End Date Taking? Authorizing Provider  acetaminophen (TYLENOL) 500 MG tablet Take 500 mg by  mouth every 6 (six) hours as needed for mild pain.    Yes Historical Provider, MD  amLODipine (NORVASC) 5 MG tablet Take 1 tablet (5 mg total) by mouth daily. 12/24/16  Yes Clanford Marisa Hua, MD  aspirin 81 MG chewable tablet Chew 81 mg by mouth daily.   Yes Historical Provider, MD  atorvastatin (LIPITOR) 40 MG tablet Take 40 mg by mouth every evening.    Yes Historical Provider, MD  b complex-vitamin c-folic acid (NEPHRO-VITE) 0.8 MG TABS tablet Take 1 tablet by mouth every morning.   Yes Historical Provider, MD  bisacodyl  (DULCOLAX) 10 MG suppository Place 10 mg rectally daily as needed for moderate constipation.   Yes Historical Provider, MD  budesonide (PULMICORT) 0.5 MG/2ML nebulizer solution Take 0.5 mg by nebulization 2 (two) times daily.   Yes Historical Provider, MD  cholecalciferol (VITAMIN D) 1000 units tablet Take 1,000 Units by mouth daily.   Yes Historical Provider, MD  docusate sodium (COLACE) 100 MG capsule Take 100 mg by mouth daily.   Yes Historical Provider, MD  doxycycline (VIBRA-TABS) 100 MG tablet Take 1 tablet (100 mg total) by mouth every 12 (twelve) hours. Take for 5 days then STOP 12/30/16 12/25/16 12/30/16 Yes Clanford L Wynetta Emery, MD  famotidine (PEPCID) 20 MG tablet Take 20 mg by mouth daily.   Yes Historical Provider, MD  ferrous sulfate 325 (65 FE) MG tablet Take 1 tablet (325 mg total) by mouth daily with breakfast. 12/24/16  Yes Clanford Marisa Hua, MD  furosemide (LASIX) 40 MG tablet Take 1 tablet (40 mg total) by mouth every other day. Patient taking differently: Take 40 mg by mouth daily.  12/24/16  Yes Clanford Marisa Hua, MD  insulin glargine (LANTUS) 100 UNIT/ML injection Inject 0.05 mLs (5 Units total) into the skin at bedtime. 12/24/16  Yes Clanford Marisa Hua, MD  ipratropium-albuterol (DUONEB) 0.5-2.5 (3) MG/3ML SOLN Take 3 mLs by nebulization every 4 (four) hours as needed (for dyspnea).   Yes Historical Provider, MD  magnesium hydroxide (MILK OF MAGNESIA) 400 MG/5ML suspension Take 30 mLs by mouth daily as needed for mild constipation.   Yes Historical Provider, MD  Melatonin 3 MG TABS Take 3 mg by mouth at bedtime.    Yes Historical Provider, MD  memantine (NAMENDA) 10 MG tablet Take 10 mg by mouth 2 (two) times daily.    Yes Historical Provider, MD  methimazole (TAPAZOLE) 10 MG tablet Take 10 mg by mouth daily.   Yes Historical Provider, MD  metoprolol (LOPRESSOR) 50 MG tablet Take 50 mg by mouth 2 (two) times daily.   Yes Historical Provider, MD  ondansetron (ZOFRAN) 4 MG tablet Take 4 mg  by mouth every 8 (eight) hours as needed for nausea or vomiting.   Yes Historical Provider, MD  saccharomyces boulardii (FLORASTOR) 250 MG capsule Take 250 mg by mouth daily as needed (while taking antibiotics).    Yes Historical Provider, MD  Sodium Phosphates (RA SALINE ENEMA RE) Place 1 Applicatorful rectally daily as needed (constipation).   Yes Historical Provider, MD  trolamine salicylate (ASPERCREME) 10 % cream Apply 1 application topically 2 (two) times daily.    Yes Historical Provider, MD    Physical Exam: Vitals:   12/26/16 2334 12/27/16 0100 12/27/16 0130 12/27/16 0345  BP: 135/73 (!) 147/71 137/85 (!) 103/59  Pulse: 98 99 90   Resp: '16 15 13 '$ (!) 21  Temp: 98.7 F (37.1 C)     SpO2: 99% 100% 100%  Constitutional: Obese, chronically ill appearing, no respiratory distress Eyes: PERRL, lids and conjunctivae normal ENMT: Mucous membranes are moist. Posterior pharynx clear of any exudate or lesions.Normal dentition.  Neck: normal, supple, no masses, no thyromegaly Respiratory: Scattered wheezes Cardiovascular: Regular rate and rhythm, no murmurs / rubs / gallops. No extremity edema. 2+ pedal pulses. No carotid bruits.  Abdomen: no tenderness, no masses palpated. No hepatosplenomegaly. Bowel sounds positive.  Musculoskeletal: no clubbing / cyanosis. No joint deformity upper and lower extremities. Good ROM, no contractures. Normal muscle tone.  Skin: no rashes, lesions, ulcers. No induration Neurologic: Minimally verbal, L sided weakness noted Psychiatric: Minimally verbal   Labs on Admission: I have personally reviewed following labs and imaging studies  CBC:  Recent Labs Lab 12/21/16 1347 12/22/16 0543 12/23/16 0422 12/26/16 2350  WBC 11.1* 11.8* 8.0 12.4*  NEUTROABS 9.4*  --   --   --   HGB 9.2* 9.3* 8.9* 9.6*  HCT 28.6* 29.4* 27.8* 29.5*  MCV 83.1 83.1 83.0 83.6  PLT 211 210 219 119   Basic Metabolic Panel:  Recent Labs Lab 12/21/16 1347 12/22/16 0543  12/23/16 0422 12/26/16 2350  NA 136 136 137 135  K 4.6 5.0 4.5 4.1  CL 106 102 104 102  CO2 19* 21* 22 24  GLUCOSE 101* 125* 84 240*  BUN 50* 55* 57* 54*  CREATININE 1.71* 1.86* 1.79* 2.36*  CALCIUM 9.6 9.9 9.6 9.7   GFR: Estimated Creatinine Clearance: 27.9 mL/min (A) (by C-G formula based on SCr of 2.36 mg/dL (H)). Liver Function Tests: No results for input(s): AST, ALT, ALKPHOS, BILITOT, PROT, ALBUMIN in the last 168 hours. No results for input(s): LIPASE, AMYLASE in the last 168 hours. No results for input(s): AMMONIA in the last 168 hours. Coagulation Profile: No results for input(s): INR, PROTIME in the last 168 hours. Cardiac Enzymes: No results for input(s): CKTOTAL, CKMB, CKMBINDEX, TROPONINI in the last 168 hours. BNP (last 3 results) No results for input(s): PROBNP in the last 8760 hours. HbA1C: No results for input(s): HGBA1C in the last 72 hours. CBG:  Recent Labs Lab 12/23/16 1722 12/23/16 2144 12/24/16 0325 12/24/16 0739 12/24/16 1140  GLUCAP 91 292* 210* 72 151*   Lipid Profile: No results for input(s): CHOL, HDL, LDLCALC, TRIG, CHOLHDL, LDLDIRECT in the last 72 hours. Thyroid Function Tests: No results for input(s): TSH, T4TOTAL, FREET4, T3FREE, THYROIDAB in the last 72 hours. Anemia Panel: No results for input(s): VITAMINB12, FOLATE, FERRITIN, TIBC, IRON, RETICCTPCT in the last 72 hours. Urine analysis:    Component Value Date/Time   COLORURINE STRAW (A) 12/22/2016 1529   APPEARANCEUR HAZY (A) 12/22/2016 1529   LABSPEC 1.011 12/22/2016 1529   PHURINE 5.0 12/22/2016 1529   GLUCOSEU NEGATIVE 12/22/2016 1529   HGBUR SMALL (A) 12/22/2016 1529   BILIRUBINUR NEGATIVE 12/22/2016 1529   KETONESUR NEGATIVE 12/22/2016 1529   PROTEINUR NEGATIVE 12/22/2016 1529   UROBILINOGEN 0.2 08/02/2015 0935   NITRITE NEGATIVE 12/22/2016 1529   LEUKOCYTESUR SMALL (A) 12/22/2016 1529    Radiological Exams on Admission: Dg Chest 2 View  Result Date:  12/27/2016 CLINICAL DATA:  Chest pain and dyspnea tonight EXAM: CHEST  2 VIEW COMPARISON:  12/23/2016 FINDINGS: The lungs are clear. The pulmonary vasculature is normal. Heart size is normal. Hilar and mediastinal contours are unremarkable. There is no pleural effusion. IMPRESSION: No active cardiopulmonary disease. Electronically Signed   By: Andreas Newport M.D.   On: 12/27/2016 00:13    EKG: Independently reviewed.  Assessment/Plan Principal  Problem:   Acute bronchitis Active Problems:   HYPERTENSION, BENIGN   AKI (acute kidney injury) ([place])   DM2 (diabetes mellitus, type 2) (H[facility])   Cognitive impairment   Diastolic dysfunction   CKD (chronic kidney disease) stage 3, GFR 30-59 ml/min    1. Acute bronchitis - 1. COPD pathway 2. Steroids 3. Breathing treatments as per pathway and adult wheeze protocol 4. Continue doxycycline 2. HTN - continue home meds 3. DM2 - 1. Continue lantus 5 units QHS 2. Adding mod scale SSI AC 4. AKI on CKD stage 3 - 1. Will "hydrate" patient by holding lasix 2. Repeat BMP tomorrow 5. Chronic diastolic CHF - 1. Holding lasix as noted above 2. BNP not elevated and trop is negative x2 3. No evidence of acute decompensation at this time 6. Cognitive impairment - due to Vascular dementia 1. Continue home meds 7. Hyperthyroidism - continue tapazole  DVT prophylaxis: Lovenox Code Status: Full Family Communication: No family in room Disposition Plan: To [facility] after admission Consults called: None Admission status: Place in obs   [place], [place] [facility] Pager [phone]  If 7AM-7PM, please contact day team taking care of patient [email or web address] Password [email or web address]  12/27/2016, 4:44 AM

## 2016-12-28 DIAGNOSIS — I519 Heart disease, unspecified: Secondary | ICD-10-CM | POA: Diagnosis not present

## 2016-12-28 DIAGNOSIS — R4189 Other symptoms and signs involving cognitive functions and awareness: Secondary | ICD-10-CM | POA: Diagnosis not present

## 2016-12-28 DIAGNOSIS — N183 Chronic kidney disease, stage 3 (moderate): Secondary | ICD-10-CM

## 2016-12-28 DIAGNOSIS — J449 Chronic obstructive pulmonary disease, unspecified: Secondary | ICD-10-CM

## 2016-12-28 DIAGNOSIS — J441 Chronic obstructive pulmonary disease with (acute) exacerbation: Secondary | ICD-10-CM | POA: Diagnosis not present

## 2016-12-28 DIAGNOSIS — N179 Acute kidney failure, unspecified: Secondary | ICD-10-CM | POA: Diagnosis not present

## 2016-12-28 DIAGNOSIS — J209 Acute bronchitis, unspecified: Principal | ICD-10-CM

## 2016-12-28 DIAGNOSIS — I10 Essential (primary) hypertension: Secondary | ICD-10-CM | POA: Diagnosis not present

## 2016-12-28 DIAGNOSIS — J9621 Acute and chronic respiratory failure with hypoxia: Secondary | ICD-10-CM

## 2016-12-28 DIAGNOSIS — E1121 Type 2 diabetes mellitus with diabetic nephropathy: Secondary | ICD-10-CM

## 2016-12-28 LAB — CBC
HCT: 30.1 % — ABNORMAL LOW (ref 36.0–46.0)
HEMOGLOBIN: 10 g/dL — AB (ref 12.0–15.0)
MCH: 27.5 pg (ref 26.0–34.0)
MCHC: 33.2 g/dL (ref 30.0–36.0)
MCV: 82.9 fL (ref 78.0–100.0)
PLATELETS: 205 10*3/uL (ref 150–400)
RBC: 3.63 MIL/uL — ABNORMAL LOW (ref 3.87–5.11)
RDW: 14.8 % (ref 11.5–15.5)
WBC: 15.6 10*3/uL — ABNORMAL HIGH (ref 4.0–10.5)

## 2016-12-28 LAB — GLUCOSE, CAPILLARY
GLUCOSE-CAPILLARY: 79 mg/dL (ref 65–99)
GLUCOSE-CAPILLARY: 163 mg/dL — AB (ref 65–99)
Glucose-Capillary: 206 mg/dL — ABNORMAL HIGH (ref 65–99)
Glucose-Capillary: 202 mg/dL — ABNORMAL HIGH (ref 65–99)

## 2016-12-28 LAB — BASIC METABOLIC PANEL
Anion gap: 9 (ref 5–15)
BUN: 63 mg/dL — AB (ref 6–20)
CALCIUM: 10 mg/dL (ref 8.9–10.3)
CHLORIDE: 107 mmol/L (ref 101–111)
CO2: 20 mmol/L — ABNORMAL LOW (ref 22–32)
Creatinine, Ser: 1.64 mg/dL — ABNORMAL HIGH (ref 0.44–1.00)
GFR calc non Af Amer: 32 mL/min — ABNORMAL LOW (ref >60)
GFR, EST AFRICAN AMERICAN: 37 mL/min — AB (ref >60)
Glucose, Bld: 75 mg/dL (ref 65–99)
Potassium: 4.7 mmol/L (ref 3.5–5.1)
SODIUM: 136 mmol/L (ref 135–145)

## 2016-12-28 MED ORDER — PREDNISONE 10 MG PO TABS: ORAL_TABLET | ORAL | 0 refills | Status: DC

## 2016-12-28 MED ORDER — FUROSEMIDE 40 MG PO TABS: 20.0000 mg | ORAL_TABLET | ORAL | Status: DC

## 2016-12-28 NOTE — Discharge Instructions (Signed)

## 2016-12-28 NOTE — Clinical Social Work Note (Signed)
Clinical Social Work Assessment  Patient Details  Name: Angelica Beck MRN: [id number] Date of Birth: 1951-02-15  Date of referral:  12/28/16               Reason for consult:  Facility Placement                Permission sought to share information with:  Family Supports Permission granted to share information::  Yes, Verbal Permission Granted  Name::     [name]  Agency::     Relationship::  Daughter  Contact Information:  [phone]  Housing/Transportation Living arrangements for the past 2 months:  [place] of Information:  Patient Patient Interpreter Needed:  None Criminal Activity/Legal Involvement Pertinent to Current Situation/Hospitalization:  No - Comment as needed Significant Relationships:  Adult Children Lives with:  Facility Resident Do you feel safe going back to the place where you live?  Yes Need for family participation in patient care:  Yes (Comment)  Care giving concerns:  Patient is agreeable to return to [facility]   Social Worker assessment / plan:  CSW Intern spoke with patient about discharge plans.  Patient was alert, sitting in chair eating and participating in conversation responding with head nods only.  CSW Intern asked patient if she was ready for discharge and patient nodded her head yes.  CSW Intern then asked Ms. Hannen if she was okay with returning to [place] and she responded yes with a head nod.  CSW Intern spoke with [name] with admissions at [facility] and confirmed that an FL2 is not needed for discharge because the patient was observation status.  CSW Intern will contact patient's daughter [name] to inform of discharge today.  Employment status:  Disabled (Comment on whether or not currently receiving Disability) Insurance information:    PT Recommendations:  [place] / Referral to community resources:  [place]  Patient/Family's  Response to care:  Patient expressed no concerns regarding hospital stay.   Patient/Family's Understanding of and Emotional Response to Diagnosis, Current Treatment, and Prognosis:  Not discussed.  Emotional Assessment Appearance:  Appears older than stated age Attitude/Demeanor/Rapport:  Unable to Assess Affect (typically observed):  Appropriate Orientation:  Oriented to Self, Oriented to Place Alcohol / Substance use:  Not Applicable Psych involvement (Current and /or in the community):  No (Comment)  Discharge Needs  Concerns to be addressed:  Discharge Planning Concerns Readmission within the last 30 days:  No Current discharge risk:  None Barriers to Discharge:  No Barriers Identified   Linward Headland, [place] Work 12/28/2016, 1:12 PM

## 2016-12-28 NOTE — Progress Notes (Addendum)
Nutrition Brief Note  RD consulted per COPD Gold Protocol.  Wt Readings from Last 15 Encounters:  12/28/16 208 lb (94.3 kg)  12/26/16 208 lb (94.3 kg)  12/24/16 208 lb 8.9 oz (94.6 kg)  12/20/16 211 lb 12.8 oz (96.1 kg)  11/28/16 205 lb (93 kg)  11/22/16 205 lb 9.6 oz (93.3 kg)  11/07/16 218 lb (98.9 kg)  11/02/16 227 lb (103 kg)  10/26/16 227 lb (103 kg)  10/06/16 227 lb (103 kg)  10/01/16 227 lb (103 kg)  09/25/16 227 lb (103 kg)  09/23/16 227 lb (103 kg)  09/21/16 227 lb (103 kg)  08/18/16 227 lb 11.8 oz (103.3 kg)   Body mass index is 33.57 kg/m. Patient meets criteria for Obesity Class I based on current BMI.   Current diet order is Heart Healthy/Carbohydrate Modified, patient is consuming approximately 100% of meals at this time.   Labs and medications reviewed.   No nutrition interventions warranted at this time. If nutrition issues arise, please consult RD.   Arthur Holms, RD, LDN Pager #: [phone] After-Hours Pager #: [phone]

## 2016-12-28 NOTE — Progress Notes (Signed)
Patient is medically stable for discharge and will discharge today back to [place].  Patient will travel to facility by [facility].  CSW Intern spoke with [name] with admissions at [facility] and confirmed that an FL2 is not needed for discharge because the patient was observation status.  CSW Intern will contact patient's daughter Angelica Beck to inform of discharge today.

## 2016-12-28 NOTE — Discharge Summary (Addendum)
Physician Discharge Summary  Angelica Beck KYH:[id number] DOB: 12-Jul-1951 DOA: 12/26/2016  PCP: Delia Chimes, NP (Inactive)  Admit date: 12/26/2016 Discharge date: 12/28/2016  Time spent: 45 minutes  Recommendations for Outpatient Follow-up:  Patient will be discharged to skilled nursing facility.  Palliative care to consult at the nursing facility. Use regular hoyer lift to move patient- to lessen her anxiety.  Patient will need to follow up with primary care provider within one week of discharge, Repeat CBC and BMP.  Patient should continue medications as prescribed.  Patient should follow a Heart healthy/carb modified diet.   Discharge Diagnoses:  COPD exacerbation/possible acute bronchitis Essential hypertension Diabetes mellitus, type II Acute kidney injury on chronic kidney disease, stage III Chronic diastolic heart failure Cognitive impairment/vascular dementia Hyperthyroidism Leukocytosis  Discharge Condition: Stable  Diet recommendation: Heart healthy/carb modified  Filed Weights   12/28/16 1039  Weight: 94.3 kg (208 lb)    History of present illness:  On 12/27/2016 by Dr. Theodis Blaze Angelica Beck is a 66 y.o. female with medical history significant of DM2, HTN, prior stroke and vascular dementia.  Patient presents to the ED via EMS with c/o acute onset, resolved CP with associated ongoing SOB that began this evening.  Per EMS patient lives at [facility] and EMS was called out for patient having "crushing CP" and SOB.  '5mg'$  of albuterol and ASA and symptoms have resolved.  Patient isnt the greatest historian secondary to vascular dementia and deficits from prior stroke. Of note patient had just been admitted and discharged by our service a couple of days ago for PNA.  Discharged on doxycycline.  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 Suspect her upper airway sounds possibly due to prior trach. -Continue doxycycline, prednisone, Pulmicort, DuoNeb treatments -Oxygen saturations have been in the upper 90s on room air  Essential hypertension -Continue amlodipine, metoprolol  Diabetes mellitus, type II -Continue Lantus  Acute kidney injury on chronic kidney disease, stage III -Creatinine peaked to 2.36, patient was placed on IV fluids, and Lasix was held -Baseline creatinine 1.6, currently creatinine 1.51  Chronic diastolic heart failure -Lasix initially held due to acute kidney injury -BNP was not elevated, troponins unremarkable -Patient does appear to be euvolemic and compensated at this time -Resume Lasix upon discharge, Lasix dose decreased to 20 mg every other day -Creatinine should be monitored periodically  Cognitive impairment/vascular dementia -Appears to be stable -Continue Namenda  Hyperthyroidism -Continue Tapazole  Leukocytosis -Likely secondary to steroids -Repeat CBC in 1 week  Goals of care -Patient may benefit from palliative care consult at the nursing facility she has had 7 ER visits within the past 6 months and this is her fourth admission  Procedures:  None  Consultations:  None  Discharge Exam: Vitals:   12/27/16 1900 12/28/16 0524  BP: (!) 120/52 (!) 128/57  Pulse: 81 68  Resp:  18  Temp: 97.7 F (36.5 C) 97.7 F (36.5 C)     General: Well developed, chronically ill appearing, NAD  HEENT: NCAT, mucous membranes moist.  Neck: Supple  Cardiovascular: S1 S2 auscultated, no rubs, murmurs or gallops. Regular rate and rhythm.  Respiratory: Diminished breath sounds. Upper respiratory sounds   Abdomen: Soft, nontender, nondistended, + bowel sounds  Extremities: warm dry without cyanosis clubbing or edema  Neuro: Currently awake, minimally verbal, left sided weakness, dementai   Discharge  Instructions Discharge  Instructions    Discharge instructions    Complete by:  As directed    Patient will be discharged to skilled nursing facility.  Palliative care to consult at the nursing facility. Use regular hoyer lift to move patient- to lessen her anxiety.  Patient will need to follow up with primary care provider within one week of discharge, Repeat CBC and BMP.  Patient should continue medications as prescribed.  Patient should follow a Heart healthy/carb modified diet.     Current Discharge Medication List    START taking these medications   Details  predniSONE (DELTASONE) 10 MG tablet Take 4 pills x 2 days. Then 3 pills x 2 days, then 2 pills x 2 days, then 1 pill x 2 days. Qty: 20 tablet, Refills: 0      CONTINUE these medications which have CHANGED   Details  furosemide (LASIX) 40 MG tablet Take 0.5 tablets (20 mg total) by mouth every other day. Qty: 30 tablet      CONTINUE these medications which have NOT CHANGED   Details  acetaminophen (TYLENOL) 500 MG tablet Take 500 mg by mouth every 6 (six) hours as needed for mild pain.     amLODipine (NORVASC) 5 MG tablet Take 1 tablet (5 mg total) by mouth daily.    aspirin 81 MG chewable tablet Chew 81 mg by mouth daily.    atorvastatin (LIPITOR) 40 MG tablet Take 40 mg by mouth every evening.     b complex-vitamin c-folic acid (NEPHRO-VITE) 0.8 MG TABS tablet Take 1 tablet by mouth every morning.    bisacodyl (DULCOLAX) 10 MG suppository Place 10 mg rectally daily as needed for moderate constipation.    budesonide (PULMICORT) 0.5 MG/2ML nebulizer solution Take 0.5 mg by nebulization 2 (two) times daily.    cholecalciferol (VITAMIN D) 1000 units tablet Take 1,000 Units by mouth daily.    docusate sodium (COLACE) 100 MG capsule Take 100 mg by mouth daily.    doxycycline (VIBRA-TABS) 100 MG tablet Take 1 tablet (100 mg total) by mouth every 12 (twelve) hours. Take for 5 days then STOP 12/30/16 Qty: 10 tablet, Refills: 0    famotidine  (PEPCID) 20 MG tablet Take 20 mg by mouth daily.    ferrous sulfate 325 (65 FE) MG tablet Take 1 tablet (325 mg total) by mouth daily with breakfast. Refills: 3    insulin glargine (LANTUS) 100 UNIT/ML injection Inject 0.05 mLs (5 Units total) into the skin at bedtime. Qty: 10 mL, Refills: 11    ipratropium-albuterol (DUONEB) 0.5-2.5 (3) MG/3ML SOLN Take 3 mLs by nebulization every 4 (four) hours as needed (for dyspnea).    magnesium hydroxide (MILK OF MAGNESIA) 400 MG/5ML suspension Take 30 mLs by mouth daily as needed for mild constipation.    Melatonin 3 MG TABS Take 3 mg by mouth at bedtime.     memantine (NAMENDA) 10 MG tablet Take 10 mg by mouth 2 (two) times daily.     methimazole (TAPAZOLE) 10 MG tablet Take 10 mg by mouth daily.    metoprolol (LOPRESSOR) 50 MG tablet Take 50 mg by mouth 2 (two) times daily.    ondansetron (ZOFRAN) 4 MG tablet Take 4 mg by mouth every 8 (eight) hours as needed for nausea or vomiting.    saccharomyces boulardii (FLORASTOR) 250 MG capsule Take 250 mg by mouth daily as needed (while taking antibiotics).     Sodium Phosphates (RA SALINE ENEMA RE) Place 1 Applicatorful rectally daily  as needed (constipation).    trolamine salicylate (ASPERCREME) 10 % cream Apply 1 application topically 2 (two) times daily.        Allergies  Allergen Reactions  . Penicillins Swelling    FACIAL SWELLING Has patient had a PCN reaction causing immediate rash, facial/tongue/throat swelling, SOB or lightheadedness with hypotension: Yes Has patient had a PCN reaction causing severe rash involving mucus membranes or skin necrosis: No Has patient had a PCN reaction that required hospitalization: No Has patient had a PCN reaction occurring within the last 10 years: No If all of the above answers are "NO", then may proceed with Cephalosporin use.   . Lactose Intolerance (Gi) Diarrhea and Nausea And Vomiting   Follow-up Information    Delia Chimes, NP. Schedule an  appointment as soon as possible for a visit in 1 week(s).   Specialty:  Nurse Practitioner Why:  Hospital follow up           The results of significant diagnostics from this hospitalization (including imaging, microbiology, ancillary and laboratory) are listed below for reference.    Significant Diagnostic Studies: Dg Chest 2 View  Result Date: 12/27/2016 CLINICAL DATA:  Chest pain and dyspnea tonight EXAM: CHEST  2 VIEW COMPARISON:  12/23/2016 FINDINGS: The lungs are clear. The pulmonary vasculature is normal. Heart size is normal. Hilar and mediastinal contours are unremarkable. There is no pleural effusion. IMPRESSION: No active cardiopulmonary disease. Electronically Signed   By: Andreas Newport M.D.   On: 12/27/2016 00:13   Dg Chest 2 View  Result Date: 12/21/2016 CLINICAL DATA:  Shortness breath.  Dyspnea. EXAM: CHEST  2 VIEW COMPARISON:  One-view chest x-ray 10/01/2016. FINDINGS: The heart size is exaggerated by low lung volumes. And mild diffuse interstitial pattern is present. No significant airspace consolidation is evident. Mild left basilar airspace disease likely reflects atelectasis. The tracheostomy tube has been removed. The visualized soft tissues and bony thorax are unremarkable. IMPRESSION: 1. Low lung volumes and mild diffuse interstitial pattern suggesting edema. 2. Asymmetric left basilar airspace disease likely reflects atelectasis. Infection is not excluded. Electronically Signed   By: San Morelle M.D.   On: 12/21/2016 13:40   US Renal  Result Date: 12/22/2016 CLINICAL DATA:  Renal failure.  Acute kidney injury. EXAM: RENAL / URINARY TRACT ULTRASOUND COMPLETE COMPARISON:  CT 08/09/2016.  Renal ultrasound 06/24/2016 FINDINGS: Right Kidney: Length: 11.7 cm. The previous increased renal echogenicity is not currently appreciated. No mass or hydronephrosis visualized. Left Kidney: Length: 10.6 cm. Echogenicity within normal limits. No mass or hydronephrosis  visualized. Cyst in the lower kidney on prior exam is not well demonstrated. Bladder: Decompressed by Foley catheter and not evaluated. IMPRESSION: No hydronephrosis or obstructive uropathy. Electronically Signed   By: Jeb Levering M.D.   On: 12/22/2016 22:29   Dg Chest Port 1 View  Result Date: 12/23/2016 CLINICAL DATA:  CHF. Hx stroke, HTN, diabetes, asthma, former smoker. EXAM: PORTABLE CHEST 1 VIEW COMPARISON:  12/21/2016 FINDINGS: Cardiac silhouette is normal in size. There is a left coronary artery stent. No mediastinal or hilar masses. No evidence of adenopathy. He clear lungs.  No pleural effusion.  No pneumothorax. Skeletal structures are grossly intact. IMPRESSION: No active disease. Electronically Signed   By: Lajean Manes M.D.   On: 12/23/2016 09:28    Microbiology: Recent Results (from the past 240 hour(s))  Blood Culture (routine x 2)     Status: None   Collection Time: 12/21/16  4:44 PM  Result Value Ref  Range Status   Specimen Description BLOOD LEFT ANTECUBITAL  Final   Special Requests BOTTLES DRAWN AEROBIC AND ANAEROBIC 5CC  Final   Culture NO GROWTH 5 DAYS  Final   Report Status 12/26/2016 FINAL  Final  Blood Culture (routine x 2)     Status: None   Collection Time: 12/21/16  4:45 PM  Result Value Ref Range Status   Specimen Description BLOOD RIGHT ARM  Final   Special Requests BOTTLES DRAWN AEROBIC AND ANAEROBIC 5CC  Final   Culture NO GROWTH 5 DAYS  Final   Report Status 12/26/2016 FINAL  Final  MRSA PCR Screening     Status: None   Collection Time: 12/21/16  9:46 PM  Result Value Ref Range Status   MRSA by PCR NEGATIVE NEGATIVE Final    Comment:        The GeneXpert MRSA Assay (FDA approved for NASAL specimens only), is one component of a comprehensive MRSA colonization surveillance program. It is not intended to diagnose MRSA infection nor to guide or monitor treatment for MRSA infections.      Labs: Basic Metabolic Panel:  Recent Labs Lab  12/21/16 1347 12/22/16 0543 12/23/16 0422 12/26/16 12/26/16 2350 12/28/16 0732  NA 136 136 137 134* 135 136  K 4.6 5.0 4.5 4.2 4.1 4.7  CL 106 102 104  --  102 107  CO2 19* 21* 22  --  24 20*  GLUCOSE 101* 125* 84  --  240* 75  BUN 50* 55* 57* 61* 54* 63*  CREATININE 1.71* 1.86* 1.79* 2.0* 2.36* 1.64*  CALCIUM 9.6 9.9 9.6  --  9.7 10.0   Liver Function Tests: No results for input(s): AST, ALT, ALKPHOS, BILITOT, PROT, ALBUMIN in the last 168 hours. No results for input(s): LIPASE, AMYLASE in the last 168 hours. No results for input(s): AMMONIA in the last 168 hours. CBC:  Recent Labs Lab 12/21/16 1347 12/22/16 0543 12/23/16 0422 12/26/16 12/26/16 2350 12/28/16 0732  WBC 11.1* 11.8* 8.0 12.7 12.4* 15.6*  NEUTROABS 9.4*  --   --   --   --   --   HGB 9.2* 9.3* 8.9* 9.8* 9.6* 10.0*  HCT 28.6* 29.4* 27.8* 31* 29.5* 30.1*  MCV 83.1 83.1 83.0  --  83.6 82.9  PLT 211 210 219 209 241 205   Cardiac Enzymes: No results for input(s): CKTOTAL, CKMB, CKMBINDEX, TROPONINI in the last 168 hours. BNP: BNP (last 3 results)  Recent Labs  08/08/16 1419 12/21/16 1347 12/26/16 2354  BNP 67.5 244.3* 22.6    ProBNP (last 3 results) No results for input(s): PROBNP in the last 8760 hours.  CBG:  Recent Labs Lab 12/27/16 0841 12/27/16 1224 12/27/16 1650 12/27/16 2035 12/28/16 0628  GLUCAP 387* 405* 306* 167* 79       Signed:  Davielle Lingelbach  [facility] 12/28/2016, 11:00 AM

## 2016-12-29 DIAGNOSIS — I5022 Chronic systolic (congestive) heart failure: Secondary | ICD-10-CM | POA: Diagnosis not present

## 2016-12-29 DIAGNOSIS — M6281 Muscle weakness (generalized): Secondary | ICD-10-CM | POA: Diagnosis not present

## 2017-01-01 ENCOUNTER — Encounter: Payer: Self-pay | Admitting: Internal Medicine

## 2017-01-01 ENCOUNTER — Non-Acute Institutional Stay (SKILLED_NURSING_FACILITY): Payer: Medicare Other | Admitting: Internal Medicine

## 2017-01-01 DIAGNOSIS — D72829 Elevated white blood cell count, unspecified: Secondary | ICD-10-CM

## 2017-01-01 DIAGNOSIS — J441 Chronic obstructive pulmonary disease with (acute) exacerbation: Secondary | ICD-10-CM | POA: Diagnosis not present

## 2017-01-01 NOTE — Patient Instructions (Signed)
Steroids will be weaned and discontinued.Pulmonary toilet will be continued. No indication for antibiotics based on the 12/27/16 x-ray Palliative care is appropriate in view of her multiple comorbidities. This will need to be pursued further with the family.

## 2017-01-01 NOTE — Progress Notes (Signed)
    This is a nursing facility follow up for [place] readmission within 30 days  Interim medical record and care since last [place] visit was updated with review of diagnostic studies and change in clinical status since last visit were documented.  HPI: Patient was hospitalized 4/3-12/28/16 for COPD exacerbation and possible acute bronchitis. The patient was sent to the emergency room with chest pain with associated progressive dyspnea which began the evening of admission. The chest pain had been described as "crushing" patient was treated with aspirin and nebulized bronchodilator. Despite healthcare associated pneumonia for which she been hospitalized recently and discharged on doxycycline; chest x-ray revealed no acute cardiopulmonary disease. Symptoms resolved with aggressive pulmonary toilet. At discharge she received a steroid burst. She was discharged back to the SNF. Palliative care consult was recommended due to her advanced comorbidities in the context of dementia. A Hoyer lift was recommended to lessen the patient's anxiety. Patient was discharged on a heart healthy, carb modified diet. On 12/28/16 labs revealed a creatinine of 1.64, BUN 63, and GFR 37. A normochromic, normocytic anemia was present with hematocrit 30.1. White count was 15,600 but she had been on steroids as noted. Glucose was only 75.  Review of systems: The patient denied any active cardiopulmonary, GI, or GU symptoms. Dementia invalidated responses. She was unable to give me the date, even the year.   Physical exam:  Pertinent or positive findings: The patient is in an oversized wheelchair. She is edentulous. She exhibits constant mastication type maneuvers. A slight gallop is suggested. Breath sounds are decreased, particularly at the bases. One half plus edema is present. Pedal pulses are decreased. She has decreased range of motion of the upper extremities, particularly on the left. Strength is  generally decreased. General appearance:Adequately nourished; no acute distress , increased work of breathing is present.   Lymphatic: No lymphadenopathy about the head, neck, axilla . Eyes: No conjunctival inflammation or lid edema is present. There is no scleral icterus. Ears:  External ear exam shows no significant lesions or deformities.   Nose:  External nasal examination shows no deformity or inflammation. Nasal mucosa are pink and moist without lesions ,exudates Oral exam: lips and gums are healthy appearing.There is no oropharyngeal erythema or exudate . Neck:  No thyromegaly, masses, tenderness noted.    Heart:  Normal rate and regular rhythm. S1 and S2 normal without murmur, click, rub .  Lungs:Chest clear to auscultation without wheezes, rhonchi,rales , rubs. Abdomen:Bowel sounds are normal. Abdomen is soft and nontender with no organomegaly, hernias,masses. GU: deferred  Extremities:  No cyanosis, clubbing  Neurologic exam : Balance,Rhomberg,finger to nose testing could not be completed due to clinical state Deep tendon reflexes are equal and brisk Skin: Warm & dry w/o tenting. No significant lesions or rash.  See summary under each active problem in the Problem List with associated updated therapeutic plan

## 2017-01-04 DIAGNOSIS — D649 Anemia, unspecified: Secondary | ICD-10-CM | POA: Diagnosis not present

## 2017-01-18 ENCOUNTER — Encounter: Payer: Self-pay | Admitting: Internal Medicine

## 2017-01-18 ENCOUNTER — Non-Acute Institutional Stay (SKILLED_NURSING_FACILITY): Payer: Medicare Other | Admitting: Internal Medicine

## 2017-01-18 DIAGNOSIS — E1121 Type 2 diabetes mellitus with diabetic nephropathy: Secondary | ICD-10-CM

## 2017-01-18 DIAGNOSIS — G9340 Encephalopathy, unspecified: Secondary | ICD-10-CM | POA: Diagnosis not present

## 2017-01-18 NOTE — Progress Notes (Signed)
   This is a nursing facility follow up for specific acute issue of diabetic foot exam as a prelude to prescribing diabetic shoes.  Interim medical record and care since last [place] visit was updated with review of diagnostic studies and change in clinical status since last visit were documented.  HPI: The patient is bedbound except when up in a wheelchair. She can propel herself with her hands but does not ambulate. She has had 3 A1c's on record. On 9/27 the value was 5.8%, on 08/02/16 it was 5.4% and on 11/29/16 6.5%. The last A1c is in the context of documented non compliance with diet here at the SNF. Family have brought in hyperglycemic carbs and desserts despite being told of the risk. Despite this;the A1c value is borderline or prediabetic  Review of systems: Dementia invalidated responses. The patient is essentially nonverbal.   Physical exam:  Pertinent or positive findings: The patient will look at the examiner but offers no verbal communication. She exhibits constant chewing motion of the mandible. There is profound decrease in range of motion and strength of the lower extremities. One half plus edema is present. Pedal pulses are decreased. The patient could not participate in sensory evaluation of the feet. Pedal pulses are decreased posteriorly. The dorsalis pedis are palpable. She has thickened toenails. There are flexion contracture the toes. Pes planus is present.  General appearance:Adequately nourished; no acute distress , increased work of breathing is present.   Skin: Warm & dry w/o tenting. No significant lesions or rash.  See summary under each active problem in the Problem List with associated updated therapeutic plan

## 2017-01-18 NOTE — Assessment & Plan Note (Addendum)
11/29/16 A1c was prediabetic at 6.5 %;basal  insulin reduced   encouraged dietary compliance although this is unlikely as family has not responded to previous pleas She is not a candidate for diabetic shoes as she is completely bedridden or wheelchair bound. Also the A1c has been excellent.

## 2017-01-18 NOTE — Assessment & Plan Note (Signed)
There's been no improvement in her mental status

## 2017-01-18 NOTE — Patient Instructions (Addendum)
She is not a candidate for diabetic shoes as she is bed or wheelchair bound and non ambulatory.

## 2017-01-22 ENCOUNTER — Encounter: Payer: Self-pay | Admitting: Adult Health

## 2017-01-22 ENCOUNTER — Ambulatory Visit (INDEPENDENT_AMBULATORY_CARE_PROVIDER_SITE_OTHER): Payer: Medicare Other | Admitting: Adult Health

## 2017-01-22 DIAGNOSIS — J42 Unspecified chronic bronchitis: Secondary | ICD-10-CM

## 2017-01-22 DIAGNOSIS — J9611 Chronic respiratory failure with hypoxia: Secondary | ICD-10-CM

## 2017-01-22 NOTE — Progress Notes (Signed)
$'@Patient'k$  ID: Angelica Beck, female    DOB: 1950-10-22, 66 y.o.   MRN: [id number]  No chief complaint on file.   Referring provider: No ref. provider found  HPI: 66 yo female former smoker followed for OHS on O2 at 2l/m   01/22/17 Follow up Post hospital follow up :  Pt presents for a post hospital follow up . Admitted earlier this month with acute bronchitis . Treated with doxycycline , steroids and nebs.  CXR 12/27/16 showed clear lungs.  She is starting to feel better. Currently on Budesonide neb. Twice daily  . Has duoneb. As needed   On Oxygen 2l/m At bedtime .   She has had several admissions over last 6 moths . Had prolonged hospitalization  In Sept 2017 with severe urosepsis complicated by SBO and respiratory failure -trach dependent. She has since then been decannulated.   Allergies  Allergen Reactions  . Penicillins Swelling    FACIAL SWELLING Has patient had a PCN reaction causing immediate rash, facial/tongue/throat swelling, SOB or lightheadedness with hypotension: Yes Has patient had a PCN reaction causing severe rash involving mucus membranes or skin necrosis: No Has patient had a PCN reaction that required hospitalization: No Has patient had a PCN reaction occurring within the last 10 years: No If all of the above answers are "NO", then may proceed with Cephalosporin use.   . Lactose Intolerance (Gi) Diarrhea and Nausea And Vomiting    Immunization History  Administered Date(s) Administered  . Influenza,inj,Quad PF,36+ Mos 08/04/2015  . Influenza-Unspecified 07/04/2016  . PPD Test 09/07/2016  . Pneumococcal-Unspecified 07/04/2016    Past Medical History:  Diagnosis Date  . Arthritis   . Asthma    09/07/16  . Diabetes mellitus, type 2 ([place])   . Hyperlipidemia   . Hypertension   . Hyperthyroidism   . Obesity hypoventilation syndrome ([place]) 06/20/2013  . Stroke ([place])   . Vascular dementia     Tobacco History: History  Smoking Status  . Former  Smoker  . Packs/day: 0.25  . Years: 30.00  . Types: Cigarettes  . Quit date: 09/25/2002  Smokeless Tobacco  . Never Used   Counseling given: Not Answered   Outpatient Encounter Prescriptions as of 01/22/2017  Medication Sig  . acetaminophen (TYLENOL) 500 MG tablet Take 500 mg by mouth every 6 (six) hours as needed for mild pain.   Marland Kitchen amLODipine (NORVASC) 5 MG tablet Take 1 tablet (5 mg total) by mouth daily.  Marland Kitchen aspirin 81 MG chewable tablet Chew 81 mg by mouth daily.  Marland Kitchen atorvastatin (LIPITOR) 40 MG tablet Take 40 mg by mouth every evening.   Marland Kitchen b complex-vitamin c-folic acid (NEPHRO-VITE) 0.8 MG TABS tablet Take 1 tablet by mouth every morning.  . bisacodyl (DULCOLAX) 10 MG suppository Place 10 mg rectally daily as needed for moderate constipation.  . budesonide (PULMICORT) 0.5 MG/2ML nebulizer solution Take 0.5 mg by nebulization 2 (two) times daily.  . cholecalciferol (VITAMIN D) 1000 units tablet Take 1,000 Units by mouth daily.  Marland Kitchen docusate sodium (COLACE) 100 MG capsule Take 100 mg by mouth daily.  . famotidine (PEPCID) 20 MG tablet Take 20 mg by mouth daily.  . ferrous sulfate 325 (65 FE) MG tablet Take 1 tablet (325 mg total) by mouth daily with breakfast.  . furosemide (LASIX) 40 MG tablet Take 0.5 tablets (20 mg total) by mouth every other day.  . insulin glargine (LANTUS) 100 UNIT/ML injection Inject 0.05 mLs (5 Units total) into the skin at  bedtime.  Marland Kitchen ipratropium-albuterol (DUONEB) 0.5-2.5 (3) MG/3ML SOLN Take 3 mLs by nebulization every 4 (four) hours as needed (for dyspnea).  . magnesium hydroxide (MILK OF MAGNESIA) 400 MG/5ML suspension Take 30 mLs by mouth daily as needed for mild constipation.  . Melatonin 3 MG TABS Take 3 mg by mouth at bedtime.   . memantine (NAMENDA) 10 MG tablet Take 10 mg by mouth 2 (two) times daily.   . methimazole (TAPAZOLE) 10 MG tablet Take 10 mg by mouth daily.  . metoprolol (LOPRESSOR) 50 MG tablet Take 50 mg by mouth 2 (two) times daily.  .  ondansetron (ZOFRAN) 4 MG tablet Take 4 mg by mouth every 8 (eight) hours as needed for nausea or vomiting.  . predniSONE (DELTASONE) 10 MG tablet Take 4 pills x 2 days. Then 3 pills x 2 days, then 2 pills x 2 days, then 1 pill x 2 days.  Marland Kitchen saccharomyces boulardii (FLORASTOR) 250 MG capsule Take 250 mg by mouth daily as needed (while taking antibiotics).   . Sodium Phosphates (RA SALINE ENEMA RE) Place 1 Applicatorful rectally daily as needed (constipation).  . trolamine salicylate (ASPERCREME) 10 % cream Apply 1 application topically 2 (two) times daily.    No facility-administered encounter medications on file as of 01/22/2017.      Review of Systems  Constitutional:   No  weight loss, night sweats,  Fevers, chills, fatigue, or  lassitude.  HEENT:   No headaches,  Difficulty swallowing,  Tooth/dental problems, or  Sore throat,                No sneezing, itching, ear ache, nasal congestion, post nasal drip,   CV:  No chest pain,  Orthopnea, PND, swelling in lower extremities, anasarca, dizziness, palpitations, syncope.   GI  No heartburn, indigestion, abdominal pain, nausea, vomiting, diarrhea, change in bowel habits, loss of appetite, bloody stools.   Resp: No shortness of breath with exertion or at rest.  No excess mucus, no productive cough,  No non-productive cough,  No coughing up of blood.  No change in color of mucus.  No wheezing.  No chest wall deformity  Skin: no rash or lesions.  GU: no dysuria, change in color of urine, no urgency or frequency.  No flank pain, no hematuria   MS:  No joint pain or swelling.  No decreased range of motion.  No back pain.    Physical Exam  BP 134/72 (BP Location: Left Arm, Cuff Size: Large)   Pulse (!) 59   SpO2 100%   GEN: A/Ox3; pleasant , NAD, elderly    HEENT:  [place]/AT,  EACs-clear, TMs-wnl, NOSE-clear, THROAT-clear, no lesions, no postnasal drip or exudate noted.   NECK:  Supple w/ fair ROM; no JVD; normal carotid impulses w/o  bruits; no thyromegaly or nodules palpated; no lymphadenopathy.    RESP  Decreased BS in bases  no accessory muscle use, no dullness to percussion  CARD:  RRR, no m/r/g, no peripheral edema, pulses intact, no cyanosis or clubbing.  GI:   Soft & nt; nml bowel sounds; no organomegaly or masses detected.   Musco: Warm bil, no deformities or joint swelling noted.   Neuro: alert, no focal deficits noted.    Skin: Warm, no lesions or rashes  Psych:  No change in mood or affect. No depression or anxiety.  No memory loss.  Lab Results:   BNP    Component Value Date/Time   BNP 22.6 12/26/2016 2354  ProBNP    Component Value Date/Time   PROBNP 56.4 01/19/2014 1325    Imaging: Dg Chest 2 View  Result Date: 12/27/2016 CLINICAL DATA:  Chest pain and dyspnea tonight EXAM: CHEST  2 VIEW COMPARISON:  12/23/2016 FINDINGS: The lungs are clear. The pulmonary vasculature is normal. Heart size is normal. Hilar and mediastinal contours are unremarkable. There is no pleural effusion. IMPRESSION: No active cardiopulmonary disease. Electronically Signed   By: Andreas Newport M.D.   On: 12/27/2016 00:13     Assessment & Plan:   No problem-specific Assessment & Plan notes found for this encounter.     Rexene Edison, NP 01/22/2017

## 2017-01-22 NOTE — Patient Instructions (Addendum)
Continue on Budesonide Neb Twice daily  .  Continue on oxygen 2l/m At bedtime   Follow up in 3 months with Dr. Halford Chessman  And As needed

## 2017-01-25 DIAGNOSIS — J42 Unspecified chronic bronchitis: Secondary | ICD-10-CM | POA: Insufficient documentation

## 2017-01-25 NOTE — Assessment & Plan Note (Signed)
Cont on current regimen    Plan  . Patient Instructions  Continue on Budesonide Neb Twice daily  .  Continue on oxygen 2l/m At bedtime   Follow up in 3 months with Dr. Halford Chessman  And As needed

## 2017-01-25 NOTE — Assessment & Plan Note (Signed)
Cont on O2 at 2l/m At bedtime

## 2017-01-30 ENCOUNTER — Encounter: Payer: Self-pay | Admitting: Podiatry

## 2017-01-30 ENCOUNTER — Ambulatory Visit (INDEPENDENT_AMBULATORY_CARE_PROVIDER_SITE_OTHER): Payer: Medicare Other | Admitting: Podiatry

## 2017-01-30 DIAGNOSIS — B351 Tinea unguium: Secondary | ICD-10-CM | POA: Diagnosis not present

## 2017-01-30 DIAGNOSIS — M79676 Pain in unspecified toe(s): Secondary | ICD-10-CM | POA: Diagnosis not present

## 2017-01-30 NOTE — Progress Notes (Signed)
Patient ID: Angelica Beck, female   DOB: 11/24/1950, 66 y.o.   MRN: [id number] Complaint:  Visit Type: Patient returns to my office for continued preventative foot care services. Complaint: Patient states" my nails have grown long and thick and become painful to walk and wear shoes" Patient has been diagnosed with DM with no foot complications. The patient presents for preventative foot care services. No changes to ROS  Podiatric Exam: Vascular: dorsalis pedis and posterior tibial pulses are palpable bilateral. Capillary return is immediate. Temperature gradient is WNL. Skin turgor WNL  Sensorium: Normal Semmes Weinstein monofilament test. Normal tactile sensation bilaterally. Nail Exam: Pt has thick disfigured discolored nails with subungual debris noted bilateral entire nail hallux through fifth toenails Ulcer Exam: There is no evidence of ulcer or pre-ulcerative changes or infection. Orthopedic Exam: Muscle tone and strength are WNL. No limitations in general ROM. No crepitus or effusions noted. Foot type and digits show no abnormalities. Bony prominences are unremarkable. Skin: No Porokeratosis. No infection or ulcers  Diagnosis:  Onychomycosis, , Pain in right toe, pain in left toes  Treatment & Plan Procedures and Treatment: Consent by patient was obtained for treatment procedures. The patient understood the discussion of treatment and procedures well. All questions were answered thoroughly reviewed. Debridement of mycotic and hypertrophic toenails, 1 through 5 bilateral and clearing of subungual debris. No ulceration, no infection noted.  Return Visit-Office Procedure: Patient instructed to return to the office for a follow up visit 3 months for continued evaluation and treatment.   Angelica Beck DPM 

## 2017-02-06 ENCOUNTER — Emergency Department (HOSPITAL_COMMUNITY): Payer: Medicare Other

## 2017-02-06 ENCOUNTER — Encounter: Payer: Self-pay | Admitting: Internal Medicine

## 2017-02-06 ENCOUNTER — Other Ambulatory Visit: Payer: Self-pay

## 2017-02-06 ENCOUNTER — Encounter (HOSPITAL_COMMUNITY): Payer: Self-pay | Admitting: Emergency Medicine

## 2017-02-06 ENCOUNTER — Emergency Department (HOSPITAL_COMMUNITY)
Admission: EM | Admit: 2017-02-06 | Discharge: 2017-02-07 | Disposition: A | Discharge status: Home / Self Care | Payer: Medicare Other | Attending: Physician Assistant | Admitting: Physician Assistant

## 2017-02-06 ENCOUNTER — Non-Acute Institutional Stay (SKILLED_NURSING_FACILITY): Payer: Medicare Other | Admitting: Internal Medicine

## 2017-02-06 DIAGNOSIS — E1165 Type 2 diabetes mellitus with hyperglycemia: Secondary | ICD-10-CM | POA: Diagnosis not present

## 2017-02-06 DIAGNOSIS — I5022 Chronic systolic (congestive) heart failure: Secondary | ICD-10-CM | POA: Insufficient documentation

## 2017-02-06 DIAGNOSIS — R0989 Other specified symptoms and signs involving the circulatory and respiratory systems: Secondary | ICD-10-CM | POA: Diagnosis not present

## 2017-02-06 DIAGNOSIS — J45909 Unspecified asthma, uncomplicated: Secondary | ICD-10-CM | POA: Insufficient documentation

## 2017-02-06 DIAGNOSIS — R079 Chest pain, unspecified: Secondary | ICD-10-CM | POA: Diagnosis present

## 2017-02-06 DIAGNOSIS — Z87891 Personal history of nicotine dependence: Secondary | ICD-10-CM | POA: Insufficient documentation

## 2017-02-06 DIAGNOSIS — D649 Anemia, unspecified: Secondary | ICD-10-CM | POA: Diagnosis not present

## 2017-02-06 DIAGNOSIS — R05 Cough: Secondary | ICD-10-CM | POA: Insufficient documentation

## 2017-02-06 DIAGNOSIS — Z794 Long term (current) use of insulin: Secondary | ICD-10-CM | POA: Insufficient documentation

## 2017-02-06 DIAGNOSIS — R042 Hemoptysis: Secondary | ICD-10-CM | POA: Diagnosis not present

## 2017-02-06 DIAGNOSIS — Z7982 Long term (current) use of aspirin: Secondary | ICD-10-CM | POA: Diagnosis not present

## 2017-02-06 DIAGNOSIS — I130 Hypertensive heart and chronic kidney disease with heart failure and stage 1 through stage 4 chronic kidney disease, or unspecified chronic kidney disease: Secondary | ICD-10-CM | POA: Insufficient documentation

## 2017-02-06 DIAGNOSIS — Z79899 Other long term (current) drug therapy: Secondary | ICD-10-CM | POA: Insufficient documentation

## 2017-02-06 DIAGNOSIS — I639 Cerebral infarction, unspecified: Secondary | ICD-10-CM | POA: Diagnosis not present

## 2017-02-06 DIAGNOSIS — Z8673 Personal history of transient ischemic attack (TIA), and cerebral infarction without residual deficits: Secondary | ICD-10-CM | POA: Insufficient documentation

## 2017-02-06 DIAGNOSIS — R7309 Other abnormal glucose: Secondary | ICD-10-CM | POA: Diagnosis not present

## 2017-02-06 DIAGNOSIS — E0590 Thyrotoxicosis, unspecified without thyrotoxic crisis or storm: Secondary | ICD-10-CM

## 2017-02-06 DIAGNOSIS — N183 Chronic kidney disease, stage 3 (moderate): Secondary | ICD-10-CM | POA: Diagnosis not present

## 2017-02-06 DIAGNOSIS — F0150 Vascular dementia without behavioral disturbance: Secondary | ICD-10-CM | POA: Diagnosis not present

## 2017-02-06 DIAGNOSIS — R404 Transient alteration of awareness: Secondary | ICD-10-CM | POA: Diagnosis not present

## 2017-02-06 DIAGNOSIS — E119 Type 2 diabetes mellitus without complications: Secondary | ICD-10-CM | POA: Insufficient documentation

## 2017-02-06 DIAGNOSIS — R059 Cough, unspecified: Secondary | ICD-10-CM

## 2017-02-06 DIAGNOSIS — E1129 Type 2 diabetes mellitus with other diabetic kidney complication: Secondary | ICD-10-CM | POA: Diagnosis not present

## 2017-02-06 DIAGNOSIS — I129 Hypertensive chronic kidney disease with stage 1 through stage 4 chronic kidney disease, or unspecified chronic kidney disease: Secondary | ICD-10-CM | POA: Diagnosis not present

## 2017-02-06 LAB — CBC WITH DIFFERENTIAL/PLATELET
BASOS ABS: 0 10*3/uL (ref 0.0–0.1)
BASOS PCT: 0 %
EOS ABS: 0.1 10*3/uL (ref 0.0–0.7)
EOS PCT: 1 %
HCT: 29.2 % — ABNORMAL LOW (ref 36.0–46.0)
Hemoglobin: 9.3 g/dL — ABNORMAL LOW (ref 12.0–15.0)
Lymphocytes Relative: 11 %
Lymphs Abs: 1.4 10*3/uL (ref 0.7–4.0)
MCH: 27.6 pg (ref 26.0–34.0)
MCHC: 31.8 g/dL (ref 30.0–36.0)
MCV: 86.6 fL (ref 78.0–100.0)
MONO ABS: 0.6 10*3/uL (ref 0.1–1.0)
Monocytes Relative: 5 %
Neutro Abs: 10.4 10*3/uL — ABNORMAL HIGH (ref 1.7–7.7)
Neutrophils Relative %: 83 %
PLATELETS: 223 10*3/uL (ref 150–400)
RBC: 3.37 MIL/uL — AB (ref 3.87–5.11)
RDW: 14.1 % (ref 11.5–15.5)
WBC: 12.5 10*3/uL — AB (ref 4.0–10.5)

## 2017-02-06 LAB — BASIC METABOLIC PANEL
ANION GAP: 9 (ref 5–15)
BUN: 32 mg/dL — ABNORMAL HIGH (ref 6–20)
CALCIUM: 9 mg/dL (ref 8.9–10.3)
CO2: 22 mmol/L (ref 22–32)
Chloride: 102 mmol/L (ref 101–111)
Creatinine, Ser: 1.66 mg/dL — ABNORMAL HIGH (ref 0.44–1.00)
GFR, EST AFRICAN AMERICAN: 37 mL/min — AB (ref >60)
GFR, EST NON AFRICAN AMERICAN: 32 mL/min — AB (ref >60)
GLUCOSE: 327 mg/dL — AB (ref 65–99)
Potassium: 3.9 mmol/L (ref 3.5–5.1)
SODIUM: 133 mmol/L — AB (ref 135–145)

## 2017-02-06 LAB — BRAIN NATRIURETIC PEPTIDE: B NATRIURETIC PEPTIDE 5: 100.3 pg/mL — AB (ref 0.0–100.0)

## 2017-02-06 LAB — I-STAT TROPONIN, ED: Troponin i, poc: 0.03 ng/mL (ref 0.00–0.08)

## 2017-02-06 MED ORDER — LEVOFLOXACIN 750 MG PO TABS
750.0000 mg | ORAL_TABLET | Freq: Once | ORAL | Status: AC
  Administered 2017-02-06: 750 mg via ORAL
  Filled 2017-02-06: qty 1

## 2017-02-06 MED ORDER — LEVOFLOXACIN 500 MG PO TABS: 500.0000 mg | ORAL_TABLET | Freq: Every day | ORAL | 0 refills | Status: AC

## 2017-02-06 NOTE — ED Notes (Signed)
Patient transported to X-ray 

## 2017-02-06 NOTE — ED Provider Notes (Signed)
[facility] DEPT Provider Note   CSN: [id number] Arrival date & time: 02/06/17  2018     History   Chief Complaint Chief Complaint  Patient presents with  . Chest Pain    HPI Angelica Beck is a 66 y.o. female.  LEVEL 5 CAVEAT for patient's baseline mental status. Patient unable to provide any history. She is denying any issues or complaints at this time. On chart review, note that the patient has had a few episodes of hemoptysis over the last couple days. She was started on Levaquin today by the physician at her facility. And it chest x-ray was performed as well. Per report by EMS, the patient was sent here for chest pain. Patient currently denying any chest pain. Denies fevers or chills.    The history is provided by the EMS personnel, medical records and the nursing home.  Illness  This is a new problem. The current episode started yesterday. The problem occurs constantly. The problem has not changed since onset.She has tried nothing for the symptoms.    Past Medical History:  Diagnosis Date  . Arthritis   . Asthma    09/07/16  . Diabetes mellitus, type 2 ([place])   . Hyperlipidemia   . Hypertension   . Hyperthyroidism   . Obesity hypoventilation syndrome ([place]) 06/20/2013  . Stroke ([place])   . Vascular dementia     Patient Active Problem List   Diagnosis Date Noted  . Chronic bronchitis ([place]) 01/25/2017  . Acute bronchitis 12/27/2016  . CKD (chronic kidney disease) stage 3, GFR 30-59 ml/min 12/27/2016  . PNA (pneumonia) 12/21/2016  . Edema 11/02/2016  . Diastolic dysfunction 16/09/930  . Osteoarthritis of both knees 04-10-202018  . At risk for aspiration 10/03/2016  . Normocytic normochromic anemia 09/07/2016  . Acute on chronic respiratory failure ([place])   . (HFpEF) heart failure with preserved ejection fraction ([place])   . Enteritis due to Clostridium difficile   . SBO (small bowel obstruction) ([place]) 08/10/2016  . Collapse of left lung   . S/P percutaneous  endoscopic gastrostomy (PEG) tube placement ([place])   . Tracheostomy status ([place])   . HCAP (healthcare-associated pneumonia) 07/16/2016  . Encephalopathy   . Palliative care encounter   . Pressure injury of skin 06/24/2016  . Acute respiratory failure ([place]) 06/21/2016  . Septic shock ([place]) 06/20/2016  . Chronic respiratory failure with hypoxia ([place]) 11/29/2015  . Essential hypertension   . Lactic acidosis 08/02/2015  . History of CVA (cerebrovascular accident) 08/02/2015  . Chronic systolic HF (heart failure) ([place]) 08/02/2015  . Cognitive impairment 08/02/2015  . AKI (acute kidney injury) ([place]) 02/08/2015  . Abnormality of gait 02/08/2015  . Hyperthyroidism 02/08/2015  . DM2 (diabetes mellitus, type 2) ([place]) 02/08/2015  . Obesity hypoventilation syndrome ([place]) 06/20/2013  . Hyperglycemia 06/28/2009  . HYPERLIPIDEMIA-MIXED 06/28/2009  . HYPERTENSION, BENIGN 06/28/2009  . CAD, NATIVE VESSEL 06/28/2009  . DYSPNEA 06/28/2009    Past Surgical History:  Procedure Laterality Date  . IR GENERIC HISTORICAL  07/12/2016   IR US GUIDE VASC ACCESS RIGHT 07/12/2016 [name], MD [facility]  . IR GENERIC HISTORICAL  07/12/2016   IR FLUORO GUIDE CV LINE RIGHT 07/12/2016 [name], MD [facility]  . IR GENERIC HISTORICAL  07/17/2016   IR GASTROSTOMY TUBE MOD SED 07/17/2016 [name], MD [facility]  . IR GENERIC HISTORICAL  08/23/2016   IR REMOVAL TUN CV CATH W/O FL 08/23/2016 [name], MD [facility]  . IR GENERIC HISTORICAL  11/09/2016  IR GASTROSTOMY TUBE REMOVAL 11/09/2016 [name], PA-C [facility]  . TRACHEOSTOMY TUBE PLACEMENT N/A 07/14/2016   Procedure: TRACHEOSTOMY, THYROID ISTHMUSECTOMY;  Surgeon: [name], MD;  Location: [facility];  Service: ENT;  Laterality: N/A;  . VENTRAL HERNIA REPAIR      OB History    No data available       Home Medications    Prior to Admission medications   Medication Sig Start Date End Date Taking? Authorizing  Provider  acetaminophen (TYLENOL) 500 MG tablet Take 500 mg by mouth every 6 (six) hours as needed for mild pain.     [provider]  amLODipine (NORVASC) 5 MG tablet Take 1 tablet (5 mg total) by mouth daily. 12/24/16   Murlean Iba, MD  aspirin 81 MG chewable tablet Chew 81 mg by mouth daily.    [provider]  atorvastatin (LIPITOR) 40 MG tablet Take 40 mg by mouth every evening.     [provider]  b complex-vitamin c-folic acid (NEPHRO-VITE) 0.8 MG TABS tablet Take 1 tablet by mouth every morning.    [provider]  bisacodyl (DULCOLAX) 10 MG suppository Place 10 mg rectally daily as needed for moderate constipation.    [provider]  budesonide (PULMICORT) 0.5 MG/2ML nebulizer solution Take 0.5 mg by nebulization 2 (two) times daily.    [provider]  cholecalciferol (VITAMIN D) 1000 units tablet Take 1,000 Units by mouth daily.    [provider]  docusate sodium (COLACE) 100 MG capsule Take 100 mg by mouth daily.    [provider]  famotidine (PEPCID) 20 MG tablet Take 20 mg by mouth daily.    [provider]  ferrous sulfate 325 (65 FE) MG tablet Take 1 tablet (325 mg total) by mouth daily with breakfast. 12/24/16   Wynetta Emery, Clanford L, MD  furosemide (LASIX) 40 MG tablet Take 0.5 tablets (20 mg total) by mouth every other day. 12/28/16   Mikhail, Velta Addison, DO  insulin glargine (LANTUS) 100 UNIT/ML injection Inject 0.05 mLs (5 Units total) into the skin at bedtime. 12/24/16   Johnson, Clanford L, MD  ipratropium-albuterol (DUONEB) 0.5-2.5 (3) MG/3ML SOLN Take 3 mLs by nebulization every 4 (four) hours as needed (for dyspnea).    [provider]  levofloxacin (LEVAQUIN) 500 MG tablet Take 1 tablet (500 mg total) by mouth daily. 02/06/17 02/11/17  Maryan Puls, MD  magnesium hydroxide (MILK OF MAGNESIA) 400 MG/5ML suspension Take 30 mLs by mouth daily as needed for mild constipation.    [provider]  Melatonin 3 MG TABS Take 3 mg by mouth at bedtime.     [provider]  memantine (NAMENDA) 10 MG tablet Take 10 mg by mouth 2 (two) times daily.     [provider]  methimazole (TAPAZOLE) 10 MG tablet Take 10 mg by mouth daily.    [provider]  metoprolol (LOPRESSOR) 50 MG tablet Take 50 mg by mouth 2 (two) times daily.    [provider]  ondansetron (ZOFRAN) 4 MG tablet Take 4 mg by mouth every 8 (eight) hours as needed for nausea or vomiting.    [provider]  predniSONE (DELTASONE) 10 MG tablet Take 4 pills x 2 days. Then 3 pills x 2 days, then 2 pills x 2 days, then 1 pill x 2 days. 12/28/16   Mikhail, Velta Addison, DO  saccharomyces boulardii (FLORASTOR) 250 MG capsule Take 250 mg by mouth daily as needed (while  taking antibiotics).     [provider]  Sodium Phosphates (RA SALINE ENEMA RE) Place 1 Applicatorful rectally daily as needed (constipation).    [provider]  trolamine salicylate (ASPERCREME) 10 % cream Apply 1 application topically 2 (two) times daily.     [provider]    Family History Family History  Problem Relation Age of Onset  . Diabetes Mother   . Cancer Neg Hx   . Heart disease Neg Hx   . Stroke Neg Hx     Social History Social History  Substance Use Topics  . Smoking status: Former Smoker    Packs/day: 0.25    Years: 30.00    Types: Cigarettes    Quit date: 09/25/2002  . Smokeless tobacco: Never Used  . Alcohol use No     Allergies   Penicillins and Lactose intolerance (gi)   Review of Systems Review of Systems  Unable to perform ROS: Dementia     Physical Exam Updated Vital Signs BP (!) 134/48   Pulse 69   Temp 98.9 F (37.2 C) (Oral)   Resp (!) 29   SpO2 99%   Physical Exam  Constitutional: She appears well-developed and well-nourished. No distress.  HENT:  Head: Normocephalic and atraumatic.  Eyes: Conjunctivae are normal.  Neck: Neck  supple.  Cardiovascular: Normal rate and regular rhythm.   No murmur heard. Pulmonary/Chest: Effort normal. No accessory muscle usage. No tachypnea. No respiratory distress. She has no wheezes. She has rhonchi.  Abdominal: Soft. There is no tenderness.  Musculoskeletal:       Right lower leg: She exhibits swelling and edema.       Left lower leg: She exhibits swelling and edema.  Neurological: She is alert.  Skin: Skin is warm and dry.  Psychiatric: She has a normal mood and affect.  Nursing note and vitals reviewed.    ED Treatments / Results  Labs (all labs ordered are listed, but only abnormal results are displayed) Labs Reviewed  CBC WITH DIFFERENTIAL/PLATELET - Abnormal; Notable for the following:       Result Value   WBC 12.5 (*)    RBC 3.37 (*)    Hemoglobin 9.3 (*)    HCT 29.2 (*)    Neutro Abs 10.4 (*)    All other components within normal limits  BASIC METABOLIC PANEL - Abnormal; Notable for the following:    Sodium 133 (*)    Glucose, Bld 327 (*)    BUN 32 (*)    Creatinine, Ser 1.66 (*)    GFR calc non Af Amer 32 (*)    GFR calc Af Amer 37 (*)    All other components within normal limits  BRAIN NATRIURETIC PEPTIDE - Abnormal; Notable for the following:    B Natriuretic Peptide 100.3 (*)    All other components within normal limits  I-STAT TROPOININ, ED    EKG  EKG Interpretation None       Radiology Dg Chest 2 View  Result Date: 02/06/2017 CLINICAL DATA:  Chest pain. Recently diagnosed with pneumonia. Some hemoptysis. EXAM: CHEST  2 VIEW COMPARISON:  12/27/2016 FINDINGS: The heart size and mediastinal contours are within normal limits. There is aortic atherosclerosis without aneurysm. Chronic mild interstitial prominence is noted of the lungs without pneumonic consolidation, effusion or pneumothorax. Degenerative changes are seen along the dorsal spine. IMPRESSION: No active cardiopulmonary disease. Chronic mild interstitial prominence may reflect  chronic bronchitic change. Electronically Signed   By: Shanon Brow  Randel Pigg M.D.   On: 02/06/2017 22:00    Procedures Procedures (including critical care time)  Medications Ordered in ED Medications  levofloxacin (LEVAQUIN) tablet 750 mg (750 mg Oral Given 02/06/17 2245)     Initial Impression / Assessment and Plan / ED Course  I have reviewed the triage vital signs and the nursing notes.  Pertinent labs & imaging results that were available during my care of the patient were reviewed by me and considered in my medical decision making (see chart for details).     Patient unable to provide accurate history. She denies any complaints at this time. Through review of the medical records as well as report by EMS, at some point over the last couple days the patient has had a couple episodes of hemoptysis and has apparently complained of chest pain.  Marily Lente the patient is well-appearing acute distress. Vital signs are normal and stable. Patient currently satting in the mid 90s on 2 L nasal cannula, her baseline oxygen. She does have bilateral rhonchorous breath sounds. No wheezing.  Chest x-rayWithout obvious pneumonia. We'll still treat her empirically given report of hemoptysis at her facility and the patient's complicated medical history.Hanley Seamen the patient first dose of antibiotics here and given a prescription. Mild leukocytosis. Electrolytes within normal limits. Creatinine at baseline. EKG without evidence of ischemic changes or interval abnormalities.. Troponin negative. BNP mildly elevated, not clinically significant.   Final Clinical Impressions(s) / ED Diagnoses   Final diagnoses:  Cough    New Prescriptions New Prescriptions   LEVOFLOXACIN (LEVAQUIN) 500 MG TABLET    Take 1 tablet (500 mg total) by mouth daily.     Maryan Puls, MD 02/06/17 2251    Macarthur Critchley, MD 02/08/17 1753

## 2017-02-06 NOTE — Assessment & Plan Note (Signed)
TSH 

## 2017-02-06 NOTE — Progress Notes (Signed)
   Facility Location:[facility]  Room Number: 218-B  Code Status: Full Code (verified with daughter 02/06/17)   This is a nursing facility follow up for specific acute issue of hemoptysis.  Interim medical record and care since last [place] visit was updated with review of diagnostic studies and change in clinical status since last visit were documented.  HPI: Staff reports excessive cough with hemoptysis. Patient has profound dementia and denies hemoptysis or any other bleeding dyscrasias. She also just denies any active upper or lower respiratory tract symptoms. She is on 81 mg chewable aspirin but no other anticoagulants. She is on nebulized treatments as well as an inhaled steroid. She recently completed a course of oral steroids in April. She is on methimazole for hyperthyroidism.  Review of systems: Dementia prevented obtaining a valid history.She is not cognizant  of the date or even her daughter's birthdate. She did recognize her daughter however.  Constitutional: No fever,significant weight change, fatigue  Eyes: No redness, discharge, pain, vision change ENT/mouth: No nasal congestion,  purulent discharge, earache,change in hearing ,sore throat  Cardiovascular: No chest pain, palpitations,paroxysmal nocturnal dyspnea, claudication, edema  Respiratory: denies cough, sputum production,hemoptysis, DOE , significant snoring,apnea   Gastrointestinal: No heartburn,dysphagia,abdominal pain, nausea / vomiting,rectal bleeding, melena,change in bowels Dermatologic: No rash, pruritus, change in appearance of skin Hematologic/lymphatic: No significant bruising, lymphadenopathy,abnormal bleeding Allergy/immunology: No itchy/ watery eyes, significant sneezing, urticaria, angioedema  Physical exam:  Pertinent or positive findings: As noted she is afebrile and has excellent O2 sats. The patient is pleasantly demented. She is edentulous. She exhibits  repetitive chewing motion of the mouth. She has a large asymmetric goiter, larger on the right but more firm on the left. Breath sounds are decreased except for some rales in the left anterior chest.She exhibits no respiratory distress. She has trace edema right lower extremity and 1/2+ on the left. Pedal pulses are decreased.  General appearance:Adequately nourished; no acute distress , increased work of breathing is present.   Lymphatic: No lymphadenopathy about the head, neck, axilla . Eyes: No conjunctival inflammation or lid edema is present. There is no scleral icterus. Ears:  External ear exam shows no significant lesions or deformities.   Nose:  External nasal examination shows no deformity or inflammation. Nasal mucosa are pink and moist without lesions ,exudates Oral exam: lips and gums are healthy appearing.There is no oropharyngeal erythema or exudate . Neck:  No masses, tenderness noted.    Heart:  Normal rate and regular rhythm. S1 and S2 normal without gallop, murmur, click, rub .  Abdomen:Bowel sounds are normal. Abdomen is soft and nontender with no organomegaly, hernias,masses. GU: deferred  Extremities:  No cyanosis, clubbing  Skin: Warm & dry w/o tenting. No significant lesions or rash.  #1 cough with hemoptysis Plan: Initiate Levaquin. Portable chest x-ray & CBC & dif

## 2017-02-06 NOTE — ED Triage Notes (Addendum)
Per [facility],  Pt from [place]. Facility reports pt complains of chest pain. Pt has no complaints. Pt denies pain, coughing, nausea. Pt alert and oriented x 3. Pt disoriented to time. Pt VSS. Pt recently diagnosed with pneumonia per facility. Facility reports some hemoptysis.

## 2017-02-06 NOTE — ED Notes (Signed)
Yellow fall risk band and yellow socks applied to pt. Curtain left open. Pt's call light in reach

## 2017-02-06 NOTE — Patient Instructions (Addendum)
See Current Assessment & Plan in Problem List under specific Diagnosis CODE STATUS discussed with daughter, daughter is emphatic that the patient is to be a full code.

## 2017-02-07 ENCOUNTER — Encounter: Payer: Self-pay | Admitting: Nurse Practitioner

## 2017-02-07 ENCOUNTER — Non-Acute Institutional Stay (SKILLED_NURSING_FACILITY): Payer: Medicare Other | Admitting: Nurse Practitioner

## 2017-02-07 DIAGNOSIS — I5022 Chronic systolic (congestive) heart failure: Secondary | ICD-10-CM | POA: Diagnosis not present

## 2017-02-07 DIAGNOSIS — D649 Anemia, unspecified: Secondary | ICD-10-CM | POA: Diagnosis not present

## 2017-02-07 DIAGNOSIS — R042 Hemoptysis: Secondary | ICD-10-CM | POA: Diagnosis not present

## 2017-02-07 DIAGNOSIS — R4189 Other symptoms and signs involving cognitive functions and awareness: Secondary | ICD-10-CM | POA: Diagnosis not present

## 2017-02-07 DIAGNOSIS — I10 Essential (primary) hypertension: Secondary | ICD-10-CM | POA: Diagnosis not present

## 2017-02-07 DIAGNOSIS — E039 Hypothyroidism, unspecified: Secondary | ICD-10-CM | POA: Diagnosis not present

## 2017-02-07 DIAGNOSIS — I25110 Atherosclerotic heart disease of native coronary artery with unstable angina pectoris: Secondary | ICD-10-CM | POA: Diagnosis not present

## 2017-02-07 DIAGNOSIS — J45909 Unspecified asthma, uncomplicated: Secondary | ICD-10-CM | POA: Diagnosis not present

## 2017-02-07 DIAGNOSIS — J9691 Respiratory failure, unspecified with hypoxia: Secondary | ICD-10-CM | POA: Diagnosis not present

## 2017-02-07 DIAGNOSIS — R079 Chest pain, unspecified: Secondary | ICD-10-CM | POA: Diagnosis not present

## 2017-02-07 DIAGNOSIS — E0590 Thyrotoxicosis, unspecified without thyrotoxic crisis or storm: Secondary | ICD-10-CM

## 2017-02-07 LAB — CBC AND DIFFERENTIAL
HCT: 28 % — AB (ref 36–46)
Hemoglobin: 8.9 g/dL — AB (ref 12.0–16.0)
Platelets: 208 10*3/uL (ref 150–399)
WBC: 11.4 10*3/mL

## 2017-02-07 LAB — TSH: TSH: 3.72 u[IU]/mL (ref 0.41–5.90)

## 2017-02-07 NOTE — Progress Notes (Signed)
Nursing Home Location: [facility] Room: [place] of Service: SNF (31)  PCP: [name], MD   Code Status: Full Code  Allergies  Allergen Reactions  . Penicillins Swelling    FACIAL SWELLING Has patient had a PCN reaction causing immediate rash, facial/tongue/throat swelling, SOB or lightheadedness with hypotension: Yes Has patient had a PCN reaction causing severe rash involving mucus membranes or skin necrosis: No Has patient had a PCN reaction that required hospitalization: No Has patient had a PCN reaction occurring within the last 10 years: No If all of the above answers are "NO", then may proceed with Cephalosporin use.   . Lactose Intolerance (Gi) Diarrhea and Nausea And Vomiting    Chief Complaint  Patient presents with  . Follow-up    Resident is being seen to follow up on ED visit on 02/06/17.  . Medical Management of Chronic Issues    Resident is also being seen for a routine visit.     HPI:  Patient is a 66 y.o. female seen today at [facility] for ED follow up and routine follow up on chronic conditions. Pt with hx of Includes obesity hypoventilation syndrome, history of hyperthyroidism, hypertension, dyslipidemia, diabetes, and arthritis. Pt was seen yesterday due to cough and hemoptysis and then went to the ED due to acute distress. Xray without obvious pneumonia however being treated with Levaquin empirically.   Otherwise pt is doing well. No acute concerns per nursing at this time.   Review of Systems:  Review of Systems  Unable to perform ROS: Dementia    Past Medical History:  Diagnosis Date  . Arthritis   . Asthma    09/07/16  . Diabetes mellitus, type 2 ([place])   . Hyperlipidemia   . Hypertension   . Hyperthyroidism   . Obesity hypoventilation syndrome ([place]) 06/20/2013  . Stroke ([place])   . Vascular dementia    Past Surgical History:  Procedure Laterality Date  . IR GENERIC HISTORICAL  07/12/2016   IR US  GUIDE VASC ACCESS RIGHT 07/12/2016 Angelica Cadet, MD [facility]  . IR GENERIC HISTORICAL  07/12/2016   IR FLUORO GUIDE CV LINE RIGHT 07/12/2016 Angelica Cadet, MD [facility]  . IR GENERIC HISTORICAL  07/17/2016   IR GASTROSTOMY TUBE MOD SED 07/17/2016 [name], MD [facility]  . IR GENERIC HISTORICAL  08/23/2016   IR REMOVAL TUN CV CATH W/O FL 08/23/2016 Angelica Cadet, MD [facility]  . IR GENERIC HISTORICAL  11/09/2016   IR GASTROSTOMY TUBE REMOVAL 11/09/2016 Angelica Dike, PA-C [facility]  . TRACHEOSTOMY TUBE PLACEMENT N/A 07/14/2016   Procedure: TRACHEOSTOMY, THYROID ISTHMUSECTOMY;  Surgeon: Angelica Quitter, MD;  Location: [place];  Service: ENT;  Laterality: N/A;  . VENTRAL HERNIA REPAIR     Social History:   reports that she quit smoking about 14 years ago. Her smoking use included Cigarettes. She has a 7.50 pack-year smoking history. She has never used smokeless tobacco. She reports that she does not drink alcohol or use drugs.  Family History  Problem Relation Age of Onset  . Diabetes Mother   . Cancer Neg Hx   . Heart disease Neg Hx   . Stroke Neg Hx     Medications: Patient's Medications  New Prescriptions   No medications on file  Previous Medications   ACETAMINOPHEN (TYLENOL) 500 MG TABLET    Take 500 mg by mouth every 6 (six) hours as needed for mild pain.  AMLODIPINE (NORVASC) 5 MG TABLET    Take 1 tablet (5 mg total) by mouth daily.   ASPIRIN 81 MG CHEWABLE TABLET    Chew 81 mg by mouth daily.   ATORVASTATIN (LIPITOR) 40 MG TABLET    Take 40 mg by mouth every evening.    B COMPLEX-VITAMIN C-FOLIC ACID (NEPHRO-VITE) 0.8 MG TABS TABLET    Take 1 tablet by mouth every morning.   BISACODYL (DULCOLAX) 10 MG SUPPOSITORY    Place 10 mg rectally daily as needed for moderate constipation.   BUDESONIDE (PULMICORT) 0.5 MG/2ML NEBULIZER SOLUTION    Take 0.5 mg by nebulization 2 (two) times daily.   CHOLECALCIFEROL (VITAMIN D) 1000 UNITS TABLET    Take 1,000  Units by mouth daily.   DOCUSATE SODIUM (COLACE) 100 MG CAPSULE    Take 100 mg by mouth daily.   FAMOTIDINE (PEPCID) 20 MG TABLET    Take 20 mg by mouth daily.   FERROUS SULFATE 325 (65 FE) MG TABLET    Take 1 tablet (325 mg total) by mouth daily with breakfast.   FUROSEMIDE (LASIX) 20 MG TABLET    Take 20 mg by mouth every other day.   INSULIN GLARGINE (LANTUS) 100 UNIT/ML INJECTION    Inject 0.05 mLs (5 Units total) into the skin at bedtime.   IPRATROPIUM-ALBUTEROL (DUONEB) 0.5-2.5 (3) MG/3ML SOLN    Take 3 mLs by nebulization every 4 (four) hours as needed (for dyspnea).   LEVOFLOXACIN (LEVAQUIN) 500 MG TABLET    Take 1 tablet (500 mg total) by mouth daily.   MAGNESIUM HYDROXIDE (MILK OF MAGNESIA) 400 MG/5ML SUSPENSION    Take 30 mLs by mouth daily as needed for mild constipation.   MELATONIN 3 MG TABS    Take 3 mg by mouth at bedtime.    MEMANTINE (NAMENDA) 10 MG TABLET    Take 10 mg by mouth 2 (two) times daily.    METHIMAZOLE (TAPAZOLE) 10 MG TABLET    Take 10 mg by mouth daily.   METOPROLOL (LOPRESSOR) 50 MG TABLET    Take 50 mg by mouth 2 (two) times daily.   ONDANSETRON (ZOFRAN) 4 MG TABLET    Take 4 mg by mouth every 8 (eight) hours as needed for nausea or vomiting.   SODIUM PHOSPHATES (RA SALINE ENEMA RE)    Place 1 Applicatorful rectally daily as needed (constipation).   TROLAMINE SALICYLATE (ASPERCREME) 10 % CREAM    Apply 1 application topically 2 (two) times daily.   Modified Medications   No medications on file  Discontinued Medications   FUROSEMIDE (LASIX) 40 MG TABLET    Take 0.5 tablets (20 mg total) by mouth every other day.   PREDNISONE (DELTASONE) 10 MG TABLET    Take 4 pills x 2 days. Then 3 pills x 2 days, then 2 pills x 2 days, then 1 pill x 2 days.   SACCHAROMYCES BOULARDII (FLORASTOR) 250 MG CAPSULE    Take 250 mg by mouth daily as needed (while taking antibiotics).      Physical Exam: Vitals:   02/07/17 1341  BP: 125/64  Pulse: 79  Resp: 20  Temp: 97.8 F  (36.6 C)  SpO2: 100%  Weight: 214 lb 12.8 oz (97.4 kg)  Height: '5\' 6"'$  (1.676 m)    Physical Exam  Constitutional: She appears well-developed and well-nourished. No distress.  HENT:  Head: Normocephalic and atraumatic.  Mouth/Throat: Oropharynx is clear and moist. No oropharyngeal exudate.  Eyes: Conjunctivae are normal. Pupils  are equal, round, and reactive to light.  Neck: Normal range of motion. Neck supple.  Cardiovascular: Normal rate, regular rhythm and normal heart sounds.   Pulmonary/Chest: Effort normal and breath sounds normal. She has no wheezes.  Abdominal: Soft. Bowel sounds are normal.  Musculoskeletal: She exhibits no edema or tenderness.  Neurological: She is alert.  Skin: Skin is warm and dry. She is not diaphoretic.  Psychiatric: Cognition and memory are impaired. She exhibits abnormal recent memory.    Labs reviewed: Basic Metabolic Panel:  Recent Labs  08/23/16 0410  08/28/16 0626 08/30/16 0700 08/31/16 0628  12/26/16 2350 12/28/16 0732 02/06/17 2057  NA 138  < > 133* 133* 135  < > 135 136 133*  K 4.3  < > 5.2* 5.3* 4.5  < > 4.1 4.7 3.9  CL 104  < > 95* 97* 98*  < > 102 107 102  CO2 27  < > '30 30 28  '$ < > 24 20* 22  GLUCOSE 150*  < > 174* 154* 202*  < > 240* 75 327*  BUN 11  < > '11 18 19  '$ < > 54* 63* 32*  CREATININE 0.95  < > 1.02* 1.34* 1.41*  < > 2.36* 1.64* 1.66*  CALCIUM 8.8*  < > 9.0 9.3 9.2  < > 9.7 10.0 9.0  MG 1.9  --  1.3* 2.1  --   --   --   --   --   PHOS  --   --  4.2 5.1* 5.3*  --   --   --   --   < > = values in this interval not displayed.  Estimated Creatinine Clearance: 40.3 mL/min (A) (by C-G formula based on SCr of 1.66 mg/dL (H)).  Liver Function Tests:  Recent Labs  07/19/16 0633  08/09/16 2026  08/19/16 0628  08/28/16 0626 08/30/16 0700 08/31/16 0628  AST 23  --  22  --  17  --   --   --   --   ALT 23  --  11*  --  9*  --   --   --   --   ALKPHOS 135*  --  93  --  63  --   --   --   --   BILITOT 0.9  --  0.7  --  0.3   --   --   --   --   PROT 6.0*  --  5.8*  --  4.7*  --   --   --   --   ALBUMIN 1.5*  < > 1.7*  < > 1.3*  < > 1.8* 2.1* 2.1*  < > = values in this interval not displayed. No results for input(s): LIPASE, AMYLASE in the last 8760 hours.  Recent Labs  06/22/16 0334 06/23/16 0332 06/29/16 1130  AMMONIA 72* 60* 21   CBC:  Recent Labs  09/10/16 0419  12/21/16 1347  12/26/16 2350 12/28/16 0732 02/06/17 2057  WBC 12.0*  < > 11.1*  < > 12.4* 15.6* 12.5*  NEUTROABS 8.0*  --  9.4*  --   --   --  10.4*  HGB 9.7*  < > 9.2*  < > 9.6* 10.0* 9.3*  HCT 29.9*  < > 28.6*  < > 29.5* 30.1* 29.2*  MCV 87.9  --  83.1  < > 83.6 82.9 86.6  PLT 327  < > 211  < > 241 205 223  < > = values  in this interval not displayed. TSH:  Recent Labs  08/05/16 0628 08/22/16 0653 11/03/16  TSH 0.641 2.277 2.46   A1C: Lab Results  Component Value Date   HGBA1C 6.5 11/29/2016   Lipid Panel:  Recent Labs  08/09/16 2026  TRIG 69   Chest X-Ray AP/LAT: 12/20/16 PA and lateral chest reveal heart and mediastinum to be normal. Left basilar infiltrate present and costophrenic angles are clear.  IMPRESSION: Left basilar infiltrate  Dg Chest 2 View  Result Date: 02/06/2017 CLINICAL DATA:  Chest pain. Recently diagnosed with pneumonia. Some hemoptysis. EXAM: CHEST  2 VIEW COMPARISON:  12/27/2016 FINDINGS: The heart size and mediastinal contours are within normal limits. There is aortic atherosclerosis without aneurysm. Chronic mild interstitial prominence is noted of the lungs without pneumonic consolidation, effusion or pneumothorax. Degenerative changes are seen along the dorsal spine. IMPRESSION: No active cardiopulmonary disease. Chronic mild interstitial prominence may reflect chronic bronchitic change. Electronically Signed   By: Ashley Royalty M.D.   On: 02/06/2017 22:00    Assessment/Plan  1. Cough with hemoptysis Doing well today, conts on levaquin without distress. ED workup reviewed  Mild  leukocytosis. Electrolytes within normal limits. Creatinine at baseline. EKG without evidence of ischemic changes or interval abnormalities.. Troponin negative. BNP mildly elevated but not clinically significant.  2. Chronic systolic HF (heart failure) (HCC) Stable, conts on lasix and lopressor  3. Essential hypertension Stable, conts on norvasc, lopressor and lasix   4. Hyperthyroidism conts on tapazole, labs have been ordered for follow up.   5. Cognitive impairment -conts on namenda 10 mg BID; without acute changes to functional or behavioral status.    Angelica Beck. Harle Battiest  [facility] [phone] 8 am - 5 pm) [phone] (after hours)

## 2017-02-07 NOTE — Progress Notes (Deleted)
Nursing Home Location: [facility] Room: [place] of Service: SNF (31)  PCP: [name], MD   Code Status: Full Code  Allergies  Allergen Reactions  . Penicillins Swelling    FACIAL SWELLING Has patient had a PCN reaction causing immediate rash, facial/tongue/throat swelling, SOB or lightheadedness with hypotension: Yes Has patient had a PCN reaction causing severe rash involving mucus membranes or skin necrosis: No Has patient had a PCN reaction that required hospitalization: No Has patient had a PCN reaction occurring within the last 10 years: No If all of the above answers are "NO", then may proceed with Cephalosporin use.   . Lactose Intolerance (Gi) Diarrhea and Nausea And Vomiting    Chief Complaint  Patient presents with  . Follow-up    Resident is being seen to follow up on ED visit on 02/06/17.  . Medical Management of Chronic Issues    Resident is also being seen for a routine visit.     HPI:  Patient is a 66 y.o. female seen today at [place] for routine follow up on chronic conditions. Pt with hx of Includes obesity hypoventilation syndrome, history of hyperthyroidism, hypertension, dyslipidemia, diabetes, and arthritis.   Review of Systems:  Review of Systems  Unable to perform ROS: Dementia    Past Medical History:  Diagnosis Date  . Arthritis   . Asthma    09/07/16  . Diabetes mellitus, type 2 ([place])   . Hyperlipidemia   . Hypertension   . Hyperthyroidism   . Obesity hypoventilation syndrome ([place]) 06/20/2013  . Stroke ([place])   . Vascular dementia    Past Surgical History:  Procedure Laterality Date  . IR GENERIC HISTORICAL  07/12/2016   IR US GUIDE VASC ACCESS RIGHT 07/12/2016 [name], MD [facility]  . IR GENERIC HISTORICAL  07/12/2016   IR FLUORO GUIDE CV LINE RIGHT 07/12/2016 [name], MD [facility]  . IR GENERIC HISTORICAL  07/17/2016   IR GASTROSTOMY TUBE MOD SED 07/17/2016  [name], MD [facility]  . IR GENERIC HISTORICAL  08/23/2016   IR REMOVAL TUN CV CATH W/O FL 08/23/2016 [name], MD [facility]  . IR GENERIC HISTORICAL  11/09/2016   IR GASTROSTOMY TUBE REMOVAL 11/09/2016 [name], PA-C [facility]  . TRACHEOSTOMY TUBE PLACEMENT N/A 07/14/2016   Procedure: TRACHEOSTOMY, THYROID ISTHMUSECTOMY;  Surgeon: [name], MD;  Location: [place];  Service: ENT;  Laterality: N/A;  . VENTRAL HERNIA REPAIR     Social History:   reports that she quit smoking about 14 years ago. Her smoking use included Cigarettes. She has a 7.50 pack-year smoking history. She has never used smokeless tobacco. She reports that she does not drink alcohol or use drugs.  Family History  Problem Relation Age of Onset  . Diabetes Mother   . Cancer Neg Hx   . Heart disease Neg Hx   . Stroke Neg Hx     Medications: Patient's Medications  New Prescriptions   No medications on file  Previous Medications   ACETAMINOPHEN (TYLENOL) 500 MG TABLET    Take 500 mg by mouth every 6 (six) hours as needed for mild pain.    AMLODIPINE (NORVASC) 5 MG TABLET    Take 1 tablet (5 mg total) by mouth daily.   ASPIRIN 81 MG CHEWABLE TABLET    Chew 81 mg by mouth daily.   ATORVASTATIN (LIPITOR) 40 MG TABLET    Take 40 mg by  mouth every evening.    B COMPLEX-VITAMIN C-FOLIC ACID (NEPHRO-VITE) 0.8 MG TABS TABLET    Take 1 tablet by mouth every morning.   BISACODYL (DULCOLAX) 10 MG SUPPOSITORY    Place 10 mg rectally daily as needed for moderate constipation.   BUDESONIDE (PULMICORT) 0.5 MG/2ML NEBULIZER SOLUTION    Take 0.5 mg by nebulization 2 (two) times daily.   CHOLECALCIFEROL (VITAMIN D) 1000 UNITS TABLET    Take 1,000 Units by mouth daily.   DOCUSATE SODIUM (COLACE) 100 MG CAPSULE    Take 100 mg by mouth daily.   FAMOTIDINE (PEPCID) 20 MG TABLET    Take 20 mg by mouth daily.   FERROUS SULFATE 325 (65 FE) MG TABLET    Take 1 tablet (325 mg total) by mouth daily with breakfast.    FUROSEMIDE (LASIX) 20 MG TABLET    Take 20 mg by mouth every other day.   INSULIN GLARGINE (LANTUS) 100 UNIT/ML INJECTION    Inject 0.05 mLs (5 Units total) into the skin at bedtime.   IPRATROPIUM-ALBUTEROL (DUONEB) 0.5-2.5 (3) MG/3ML SOLN    Take 3 mLs by nebulization every 4 (four) hours as needed (for dyspnea).   LEVOFLOXACIN (LEVAQUIN) 500 MG TABLET    Take 1 tablet (500 mg total) by mouth daily.   MAGNESIUM HYDROXIDE (MILK OF MAGNESIA) 400 MG/5ML SUSPENSION    Take 30 mLs by mouth daily as needed for mild constipation.   MELATONIN 3 MG TABS    Take 3 mg by mouth at bedtime.    MEMANTINE (NAMENDA) 10 MG TABLET    Take 10 mg by mouth 2 (two) times daily.    METHIMAZOLE (TAPAZOLE) 10 MG TABLET    Take 10 mg by mouth daily.   METOPROLOL (LOPRESSOR) 50 MG TABLET    Take 50 mg by mouth 2 (two) times daily.   ONDANSETRON (ZOFRAN) 4 MG TABLET    Take 4 mg by mouth every 8 (eight) hours as needed for nausea or vomiting.   SODIUM PHOSPHATES (RA SALINE ENEMA RE)    Place 1 Applicatorful rectally daily as needed (constipation).   TROLAMINE SALICYLATE (ASPERCREME) 10 % CREAM    Apply 1 application topically 2 (two) times daily.   Modified Medications   No medications on file  Discontinued Medications   FUROSEMIDE (LASIX) 40 MG TABLET    Take 0.5 tablets (20 mg total) by mouth every other day.   PREDNISONE (DELTASONE) 10 MG TABLET    Take 4 pills x 2 days. Then 3 pills x 2 days, then 2 pills x 2 days, then 1 pill x 2 days.   SACCHAROMYCES BOULARDII (FLORASTOR) 250 MG CAPSULE    Take 250 mg by mouth daily as needed (while taking antibiotics).      Physical Exam: Vitals:   02/07/17 1341  BP: 125/64  Pulse: 79  Resp: 20  Temp: 97.8 F (36.6 C)  SpO2: 100%  Weight: 214 lb 12.8 oz (97.4 kg)  Height: '5\' 6"'$  (1.676 m)    Physical Exam  Constitutional: She appears well-developed and well-nourished. No distress.  HENT:  Head: Normocephalic and atraumatic.  Mouth/Throat: Oropharynx is clear and  moist. No oropharyngeal exudate.  Eyes: Conjunctivae are normal. Pupils are equal, round, and reactive to light.  Neck: Normal range of motion. Neck supple.  Cardiovascular: Normal rate, regular rhythm and normal heart sounds.   Pulmonary/Chest: Effort normal. She has wheezes (throughout).  Abdominal: Soft. Bowel sounds are normal.  Musculoskeletal: She exhibits no edema  or tenderness.  Neurological: She is alert.  Skin: Skin is warm and dry. She is not diaphoretic.    Labs reviewed: Basic Metabolic Panel:  Recent Labs  08/23/16 0410  08/28/16 0626 08/30/16 0700 08/31/16 0628  12/26/16 2350 12/28/16 0732 02/06/17 2057  NA 138  < > 133* 133* 135  < > 135 136 133*  K 4.3  < > 5.2* 5.3* 4.5  < > 4.1 4.7 3.9  CL 104  < > 95* 97* 98*  < > 102 107 102  CO2 27  < > '30 30 28  '$ < > 24 20* 22  GLUCOSE 150*  < > 174* 154* 202*  < > 240* 75 327*  BUN 11  < > '11 18 19  '$ < > 54* 63* 32*  CREATININE 0.95  < > 1.02* 1.34* 1.41*  < > 2.36* 1.64* 1.66*  CALCIUM 8.8*  < > 9.0 9.3 9.2  < > 9.7 10.0 9.0  MG 1.9  --  1.3* 2.1  --   --   --   --   --   PHOS  --   --  4.2 5.1* 5.3*  --   --   --   --   < > = values in this interval not displayed.  Estimated Creatinine Clearance: 40.3 mL/min (A) (by C-G formula based on SCr of 1.66 mg/dL (H)).  Liver Function Tests:  Recent Labs  07/19/16 0633  08/09/16 2026  08/19/16 0628  08/28/16 0626 08/30/16 0700 08/31/16 0628  AST 23  --  22  --  17  --   --   --   --   ALT 23  --  11*  --  9*  --   --   --   --   ALKPHOS 135*  --  93  --  63  --   --   --   --   BILITOT 0.9  --  0.7  --  0.3  --   --   --   --   PROT 6.0*  --  5.8*  --  4.7*  --   --   --   --   ALBUMIN 1.5*  < > 1.7*  < > 1.3*  < > 1.8* 2.1* 2.1*  < > = values in this interval not displayed. No results for input(s): LIPASE, AMYLASE in the last 8760 hours.  Recent Labs  06/22/16 0334 06/23/16 0332 06/29/16 1130  AMMONIA 72* 60* 21   CBC:  Recent Labs  09/10/16 0419   12/21/16 1347  12/26/16 2350 12/28/16 0732 02/06/17 2057  WBC 12.0*  < > 11.1*  < > 12.4* 15.6* 12.5*  NEUTROABS 8.0*  --  9.4*  --   --   --  10.4*  HGB 9.7*  < > 9.2*  < > 9.6* 10.0* 9.3*  HCT 29.9*  < > 28.6*  < > 29.5* 30.1* 29.2*  MCV 87.9  --  83.1  < > 83.6 82.9 86.6  PLT 327  < > 211  < > 241 205 223  < > = values in this interval not displayed. TSH:  Recent Labs  08/05/16 0628 08/22/16 0653 11/03/16  TSH 0.641 2.277 2.46   A1C: Lab Results  Component Value Date   HGBA1C 6.5 11/29/2016   Lipid Panel:  Recent Labs  08/09/16 2026  TRIG 69   Chest X-Ray AP/LAT: 12/20/16 PA and lateral chest reveal heart and mediastinum to be  normal. Left basilar infiltrate present and costophrenic angles are clear.  IMPRESSION: Left basilar infiltrate  Assessment/Plan Jessica K. Harle Battiest  [facility] [phone] 8 am - 5 pm) [phone] (after hours)

## 2017-02-08 ENCOUNTER — Encounter: Payer: Self-pay | Admitting: *Deleted

## 2017-02-14 DIAGNOSIS — D649 Anemia, unspecified: Secondary | ICD-10-CM | POA: Diagnosis not present

## 2017-02-14 DIAGNOSIS — E119 Type 2 diabetes mellitus without complications: Secondary | ICD-10-CM | POA: Diagnosis not present

## 2017-02-14 LAB — CBC AND DIFFERENTIAL
HEMATOCRIT: 28 — AB (ref 36–46)
HEMOGLOBIN: 8.6 — AB (ref 12.0–16.0)
NEUTROS ABS: 6
PLATELETS: 159 (ref 150–399)
WBC: 8.5

## 2017-02-15 ENCOUNTER — Non-Acute Institutional Stay (SKILLED_NURSING_FACILITY): Payer: Medicare Other | Admitting: Internal Medicine

## 2017-02-15 ENCOUNTER — Encounter: Payer: Self-pay | Admitting: Internal Medicine

## 2017-02-15 DIAGNOSIS — Z515 Encounter for palliative care: Secondary | ICD-10-CM

## 2017-02-15 DIAGNOSIS — Z930 Tracheostomy status: Secondary | ICD-10-CM | POA: Diagnosis not present

## 2017-02-15 DIAGNOSIS — D649 Anemia, unspecified: Secondary | ICD-10-CM

## 2017-02-15 DIAGNOSIS — Z87898 Personal history of other specified conditions: Secondary | ICD-10-CM

## 2017-02-15 DIAGNOSIS — Z8709 Personal history of other diseases of the respiratory system: Secondary | ICD-10-CM | POA: Diagnosis not present

## 2017-02-15 NOTE — Patient Instructions (Signed)
See assessment and plan under each diagnosis in the problem list and acutely for this visit 

## 2017-02-15 NOTE — Progress Notes (Signed)
   [facility] Room: [address]  This is a nursing facility follow up for multiple issues.  Interim medical record and care since last [place] visit was updated with review of diagnostic studies and change in clinical status since last visit were documented.  HPI: Staff has reported hemoptysis intermittently. The patient denies this but she has severe dementia and is essentially nonverbal, responding with a negative shake of the head. She also denies any bleeding dyscrasias except for bruising. When I asked about bruising slightly later, she denied bruising.  Denies any active cardiopulmonary symptoms. She has a normochromic, normocytic anemia, the hemoglobin has dropped from 8.9 down to 8.6 over the last 8 days. Over the last the 6 months hemoglobin has ranged from 7.4 on 08/22/16 to a high of 10 on 4/5. The patient has mild renal insufficiency with a creatinine 1.66 and GFR 37. This level of anemia should not be associated with this degree of renal insufficiency. She is on iron orally. She is also on Tapazole which can cause anemia but would also be expected to impact the white count and platelet count potentially as well.. Chest x-ray has revealed chronic bronchitic changes  Review of systems: As noted review of systems is unreliable due to her dementia  Constitutional: No fever,significant weight change, fatigue  Eyes: No redness, discharge, pain, vision change ENT/mouth: No nasal congestion,  purulent discharge, earache,change in hearing ,sore throat  Cardiovascular: No chest pain, palpitations,paroxysmal nocturnal dyspnea, claudication, edema  Respiratory: No cough, sputum production,hemoptysis, DOE , significant snoring,apnea  Gastrointestinal: No heartburn,dysphagia,abdominal pain, nausea / vomiting,rectal bleeding, melena,change in bowels Genitourinary: No dysuria,hematuria, pyuria,  incontinence, nocturia Dermatologic: No rash, pruritus, change in  appearance of skin Hematologic/lymphatic: No significant  lymphadenopathy,abnormal bleeding  Physical exam:  Pertinent or positive findings: Thyroid asymmetry is suggested with enlargement on the left. She has positional stridor seemingly induced with hyperextension of the neck  Breath sounds are generally decreased except for isolated expiratory rhonchi. Heart sounds are also decreased.abdomen is massive. She has 2+ edema. Homans sign is clinically negative. She has almost no range of motion of the lower extremities. She has decreased strength of the upper extremities.  General appearance:Adequately nourished; no acute distress , increased work of breathing is present.   Lymphatic: No lymphadenopathy about the head, neck, axilla . Eyes: No conjunctival inflammation or lid edema is present. There is no scleral icterus. Ears:  External ear exam shows no significant lesions or deformities.   Nose:  External nasal examination shows no deformity or inflammation. Nasal mucosa are pink and moist without lesions ,exudates Oral exam: lips and gums are healthy appearing.There is no oropharyngeal erythema or exudate . Neck:  No thyromegaly, masses, tenderness noted.    Heart:  No gallop, murmur, click, rub .  Abdomen:Bowel sounds are normal. Abdomen is soft and nontender with no organomegaly, hernias,masses. GU: deferred  Extremities:  No cyanosis, clubbing  Skin: Warm & dry w/o tenting. No significant lesions or rash.  See summary under each active problem in the Problem List with associated updated therapeutic plan

## 2017-02-15 NOTE — Assessment & Plan Note (Signed)
D dimer Continue iron PO

## 2017-02-15 NOTE — Assessment & Plan Note (Addendum)
02/16/16 positional stridor noted over the upper airway; with stridor and intermittent hemoptysis, Pul/CC reevaluation appropriate. Etiology could be stricture or related to thyroidomegaly

## 2017-02-16 DIAGNOSIS — Z8709 Personal history of other diseases of the respiratory system: Secondary | ICD-10-CM | POA: Insufficient documentation

## 2017-02-16 DIAGNOSIS — Z87898 Personal history of other specified conditions: Secondary | ICD-10-CM | POA: Insufficient documentation

## 2017-02-16 NOTE — Assessment & Plan Note (Signed)
02/06/17 patient's daughter demanded full code status when I attempted to discuss poor prognosis

## 2017-02-16 NOTE — Assessment & Plan Note (Signed)
D dimer Pul/CC reassessment

## 2017-02-19 DIAGNOSIS — A0471 Enterocolitis due to Clostridium difficile, recurrent: Secondary | ICD-10-CM | POA: Diagnosis not present

## 2017-02-22 ENCOUNTER — Non-Acute Institutional Stay (SKILLED_NURSING_FACILITY): Payer: Medicare Other | Admitting: Internal Medicine

## 2017-02-22 ENCOUNTER — Encounter: Payer: Self-pay | Admitting: Internal Medicine

## 2017-02-22 DIAGNOSIS — D649 Anemia, unspecified: Secondary | ICD-10-CM | POA: Diagnosis not present

## 2017-02-22 DIAGNOSIS — R042 Hemoptysis: Secondary | ICD-10-CM | POA: Diagnosis not present

## 2017-02-22 DIAGNOSIS — I639 Cerebral infarction, unspecified: Secondary | ICD-10-CM | POA: Insufficient documentation

## 2017-02-22 NOTE — Assessment & Plan Note (Signed)
Prophylactic anticoagulation would be associated with marked risk With history of previous stroke, Eliquis might be most appropriate option if anticoagulation must be pursued. Warfarin would be difficult to manage with her polypharmacy.

## 2017-02-22 NOTE — Assessment & Plan Note (Addendum)
Recheck CBC Venous Doppler as least harmful assessment/intervention  if DVT present anticoagulation would be forced

## 2017-02-22 NOTE — Progress Notes (Signed)
   [facility] Room: [address]  This is a nursing facility follow up for specific acute issue of elevated D dimer in the context of intermittent hemoptysis.  Interim medical record and care since last [place] visit was updated with review of diagnostic studies and change in clinical status since last visit were documented.  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 The patient denies hemoptysis but obviously can give no meaningful history. Most recent chest x-ray was 02/06/17, this revealed chronic mild interstitial changes in the lung fields. No active cardiopulmonary process identified. Chronic bronchitic changes were suggested. She was sent to the ED for chest pain , at that visit the patient denied having chest pain. EKG revealed nonspecific ST-T changes. Rhythm was sinus. CT scan of the lungs has not been performed as the patient has advanced kidney disease with creatinine 1.66 and GFR 37. She is not on anticoagulation except for low-dose aspirin. She has demonstrated normochromic, normocytic anemia with progressive drop in hemoglobin. Staff reports that she has not had recurrent hemoptysis since she was treated for bronchitis  Review of systems: Dementia invalidated responses, responses to all questions was a negative shake of the head.  Constitutional: No fever,significant weight change, fatigue   Cardiovascular: No chest pain, palpitations,paroxysmal nocturnal dyspnea, claudication, edema  Respiratory: No cough, sputum production,hemoptysis, DOE , significant snoring,apnea  Gastrointestinal: No heartburn,dysphagia,abdominal pain, nausea / vomiting,rectal bleeding, melena,change in bowels Genitourinary: No dysuria,hematuria, pyuria,  incontinence, nocturia Musculoskeletal: No joint stiffness, joint swelling, weakness,pain Dermatologic: No rash, pruritus, change in  appearance of skin Psychiatric: No significant anxiety , depression, insomnia, anorexia Hematologic/lymphatic: No significant bruising, lymphadenopathy,abnormal bleeding   Physical exam:  Pertinent or positive findings: The patient sits in a wheelchair on nasal oxygen. She is essentially nonverbal. She is edentulous. She exhibits chewing motion of the mandible. She has an asymmetric goiter, larger on the right than the left. Heart rhythm is slightly irregular. Breath sounds are decreased with minor scattered rhonchi. Previously noted rhonchi over the upper airway  not present today. Abdomen is protuberant. She has 1+ pedal edema. Homans sign is negative. She has essentially no range of motion of the lower extremities. Upper extremity range of motion is limited.  General appearance:Adequately nourished; no acute distress , increased work of breathing is present.   Lymphatic: No lymphadenopathy about the head, neck, axilla . Eyes: No conjunctival inflammation or lid edema is present. There is no scleral icterus. Ears:  External ear exam shows no significant lesions or deformities.   Nose:  External nasal examination shows no deformity or inflammation. Nasal mucosa are pink and moist without lesions ,exudates Oral exam: lips and gums are healthy appearing.There is no oropharyngeal erythema or exudate . Neck:  No masses, tenderness noted.    Heart:  No gallop, murmur, click, rub .  Abdomen:Bowel sounds are normal. Abdomen is soft and nontender with no organomegaly, hernias,masses. GU: deferred  Extremities:  No cyanosis, clubbing  Skin: Warm & dry w/o tenting. No significant lesions or rash.  See summary under each active problem in the Problem List with associated updated therapeutic plan

## 2017-02-22 NOTE — Patient Instructions (Signed)
See assessment and plan under each diagnosis in the problem list and acutely for this visit 

## 2017-02-23 DIAGNOSIS — M79604 Pain in right leg: Secondary | ICD-10-CM | POA: Diagnosis not present

## 2017-02-23 DIAGNOSIS — D649 Anemia, unspecified: Secondary | ICD-10-CM | POA: Diagnosis not present

## 2017-02-23 DIAGNOSIS — A0471 Enterocolitis due to Clostridium difficile, recurrent: Secondary | ICD-10-CM | POA: Diagnosis not present

## 2017-02-23 DIAGNOSIS — M79605 Pain in left leg: Secondary | ICD-10-CM | POA: Diagnosis not present

## 2017-02-23 DIAGNOSIS — J45909 Unspecified asthma, uncomplicated: Secondary | ICD-10-CM | POA: Diagnosis not present

## 2017-02-23 DIAGNOSIS — E119 Type 2 diabetes mellitus without complications: Secondary | ICD-10-CM | POA: Diagnosis not present

## 2017-02-23 DIAGNOSIS — J9691 Respiratory failure, unspecified with hypoxia: Secondary | ICD-10-CM | POA: Diagnosis not present

## 2017-02-23 LAB — CBC AND DIFFERENTIAL
HEMATOCRIT: 27 — AB (ref 36–46)
Hemoglobin: 8.8 — AB (ref 12.0–16.0)
PLATELETS: 201 (ref 150–399)
WBC: 8.9

## 2017-03-09 ENCOUNTER — Non-Acute Institutional Stay (SKILLED_NURSING_FACILITY): Payer: Medicare Other | Admitting: Nurse Practitioner

## 2017-03-09 ENCOUNTER — Encounter: Payer: Self-pay | Admitting: Nurse Practitioner

## 2017-03-09 DIAGNOSIS — E1121 Type 2 diabetes mellitus with diabetic nephropathy: Secondary | ICD-10-CM | POA: Diagnosis not present

## 2017-03-09 DIAGNOSIS — J9611 Chronic respiratory failure with hypoxia: Secondary | ICD-10-CM | POA: Diagnosis not present

## 2017-03-09 DIAGNOSIS — R042 Hemoptysis: Secondary | ICD-10-CM

## 2017-03-09 DIAGNOSIS — J42 Unspecified chronic bronchitis: Secondary | ICD-10-CM | POA: Diagnosis not present

## 2017-03-09 DIAGNOSIS — I5022 Chronic systolic (congestive) heart failure: Secondary | ICD-10-CM | POA: Diagnosis not present

## 2017-03-09 DIAGNOSIS — D500 Iron deficiency anemia secondary to blood loss (chronic): Secondary | ICD-10-CM | POA: Diagnosis not present

## 2017-03-09 NOTE — Progress Notes (Signed)
Nursing Home Location: [facility] Room: [place] of Service: SNF (31)  PCP: [name], MD   Code Status: Full Code  Allergies  Allergen Reactions  . Penicillins Swelling    FACIAL SWELLING Has patient had a PCN reaction causing immediate rash, facial/tongue/throat swelling, SOB or lightheadedness with hypotension: Yes Has patient had a PCN reaction causing severe rash involving mucus membranes or skin necrosis: No Has patient had a PCN reaction that required hospitalization: No Has patient had a PCN reaction occurring within the last 10 years: No If all of the above answers are "NO", then may proceed with Cephalosporin use.   . Lactose Intolerance (Gi) Diarrhea and Nausea And Vomiting    Chief Complaint  Patient presents with  . Medical Management of Chronic Issues    Resident is being seen for a routine visit.     HPI:  Patient is a 66 y.o. female seen today at [facility] for routine follow up on chronic conditions. Pt with hx of Includes obesity hypoventilation syndrome, history of hyperthyroidism, hypertension, dyslipidemia, diabetes, and arthritis. Pt was sent to ED last month due to hemoptysis; chest xray was without active disease but she was treated with Levaquin. Staff had reported intermittent hemoptysis after ED visit but now none has been reported.  Pt was referred to pulmonary due to hemoptysis and due to stridor which has been noted frequently. There is no complaints of this today.  hgb conts to trend down at 8.8  Review of Systems:  Review of Systems  Unable to perform ROS: Dementia    Past Medical History:  Diagnosis Date  . Arthritis   . Asthma    09/07/16  . Diabetes mellitus, type 2 ([place])   . Hyperlipidemia   . Hypertension   . Hyperthyroidism   . Obesity hypoventilation syndrome ([place]) 06/20/2013  . Stroke ([place])   . Vascular dementia    Past Surgical History:  Procedure Laterality Date  . IR GENERIC  HISTORICAL  07/12/2016   IR US GUIDE VASC ACCESS RIGHT 07/12/2016 [name], MD [facility]  . IR GENERIC HISTORICAL  07/12/2016   IR FLUORO GUIDE CV LINE RIGHT 07/12/2016 [name], MD [facility]  . IR GENERIC HISTORICAL  07/17/2016   IR GASTROSTOMY TUBE MOD SED 07/17/2016 [name], MD [facility]  . IR GENERIC HISTORICAL  08/23/2016   IR REMOVAL TUN CV CATH W/O FL 08/23/2016 [name], MD [facility]  . IR GENERIC HISTORICAL  11/09/2016   IR GASTROSTOMY TUBE REMOVAL 11/09/2016 [name], PA-C [facility]  . TRACHEOSTOMY TUBE PLACEMENT N/A 07/14/2016   Procedure: TRACHEOSTOMY, THYROID ISTHMUSECTOMY;  Surgeon: [name], MD;  Location: [facility];  Service: ENT;  Laterality: N/A;  . VENTRAL HERNIA REPAIR     Social History:   reports that she quit smoking about 14 years ago. Her smoking use included Cigarettes. She has a 7.50 pack-year smoking history. She has never used smokeless tobacco. She reports that she does not drink alcohol or use drugs.  Family History  Problem Relation Age of Onset  . Diabetes Mother   . Cancer Neg Hx   . Heart disease Neg Hx   . Stroke Neg Hx     Medications: Patient's Medications  New Prescriptions   No medications on file  Previous Medications   ACETAMINOPHEN (TYLENOL) 500 MG TABLET    Take 500 mg by mouth every 6 (six) hours as needed for mild pain.  AMLODIPINE (NORVASC) 5 MG TABLET    Take 1 tablet (5 mg total) by mouth daily.   ASPIRIN 81 MG CHEWABLE TABLET    Chew 81 mg by mouth daily.   ATORVASTATIN (LIPITOR) 40 MG TABLET    Take 40 mg by mouth every evening.    B COMPLEX-VITAMIN C-FOLIC ACID (NEPHRO-VITE) 0.8 MG TABS TABLET    Take 1 tablet by mouth every morning.   BISACODYL (DULCOLAX) 10 MG SUPPOSITORY    Place 10 mg rectally daily as needed for moderate constipation.   BUDESONIDE (PULMICORT) 0.5 MG/2ML NEBULIZER SOLUTION    Take 0.5 mg by nebulization 2 (two) times daily.   CHOLECALCIFEROL (VITAMIN D) 1000  UNITS TABLET    Take 1,000 Units by mouth daily.   DOCUSATE SODIUM (COLACE) 100 MG CAPSULE    Take 100 mg by mouth daily.   FAMOTIDINE (PEPCID) 20 MG TABLET    Take 20 mg by mouth daily.   FERROUS SULFATE 325 (65 FE) MG TABLET    Take 1 tablet (325 mg total) by mouth daily with breakfast.   FUROSEMIDE (LASIX) 20 MG TABLET    Take 1/2 tablet by mouth once daily for CHF/Edema   INSULIN DETEMIR (LEVEMIR) 100 UNIT/ML INJECTION    Inject 5 Units into the skin at bedtime.   IPRATROPIUM-ALBUTEROL (DUONEB) 0.5-2.5 (3) MG/3ML SOLN    Take 3 mLs by nebulization every 4 (four) hours as needed (for dyspnea).   MAGNESIUM HYDROXIDE (MILK OF MAGNESIA) 400 MG/5ML SUSPENSION    Take 30 mLs by mouth daily as needed for mild constipation.   MELATONIN 3 MG TABS    Take 3 mg by mouth at bedtime.    MEMANTINE (NAMENDA) 10 MG TABLET    Take 10 mg by mouth 2 (two) times daily.    METHIMAZOLE (TAPAZOLE) 10 MG TABLET    Take 10 mg by mouth daily.   METOPROLOL (LOPRESSOR) 50 MG TABLET    Take 50 mg by mouth 2 (two) times daily.   ONDANSETRON (ZOFRAN) 4 MG TABLET    Take 4 mg by mouth every 8 (eight) hours as needed for nausea or vomiting.   SODIUM PHOSPHATES (RA SALINE ENEMA RE)    Place 1 Applicatorful rectally daily as needed (constipation).   TROLAMINE SALICYLATE (ASPERCREME) 10 % CREAM    Apply 1 application topically 2 (two) times daily.   Modified Medications   No medications on file  Discontinued Medications   No medications on file     Physical Exam: Vitals:   03/09/17 0959  BP: 125/67  Pulse: 71  Resp: 18  Temp: 97.4 F (36.3 C)  SpO2: 97%  Weight: 220 lb 6.4 oz (100 kg)  Height: 5\' 6"  (1.676 m)    Physical Exam  Constitutional: She appears well-developed and well-nourished. No distress.  HENT:  Head: Normocephalic and atraumatic.  Mouth/Throat: Oropharynx is clear and moist. No oropharyngeal exudate.  Eyes: Conjunctivae are normal. Pupils are equal, round, and reactive to light.  Neck: Normal  range of motion. Neck supple.  Cardiovascular: Normal rate, regular rhythm and normal heart sounds.   Pulmonary/Chest: Effort normal and breath sounds normal. She has no wheezes.  Abdominal: Soft. Bowel sounds are normal.  Musculoskeletal: She exhibits no edema or tenderness.  Neurological: She is alert.  Skin: Skin is warm and dry. She is not diaphoretic.  Psychiatric: Cognition and memory are impaired. She exhibits abnormal recent memory.    Labs reviewed: Basic Metabolic Panel:  Recent Labs  08/23/16 0410  08/28/16 0626 08/30/16 0700 08/31/16 0628  12/26/16 2350 12/28/16 0732 02/06/17 2057  NA 138  < > 133* 133* 135  < > 135 136 133*  K 4.3  < > 5.2* 5.3* 4.5  < > 4.1 4.7 3.9  CL 104  < > 95* 97* 98*  < > 102 107 102  CO2 27  < > 30 30 28   < > 24 20* 22  GLUCOSE 150*  < > 174* 154* 202*  < > 240* 75 327*  BUN 11  < > 11 18 19   < > 54* 63* 32*  CREATININE 0.95  < > 1.02* 1.34* 1.41*  < > 2.36* 1.64* 1.66*  CALCIUM 8.8*  < > 9.0 9.3 9.2  < > 9.7 10.0 9.0  MG 1.9  --  1.3* 2.1  --   --   --   --   --   PHOS  --   --  4.2 5.1* 5.3*  --   --   --   --   < > = values in this interval not displayed.  CrCl cannot be calculated (Patient's most recent lab result is older than the maximum 21 days allowed.).  Liver Function Tests:  Recent Labs  07/19/16 0633  08/09/16 2026  08/19/16 0628  08/28/16 0626 08/30/16 0700 08/31/16 0628  AST 23  --  22  --  17  --   --   --   --   ALT 23  --  11*  --  9*  --   --   --   --   ALKPHOS 135*  --  93  --  63  --   --   --   --   BILITOT 0.9  --  0.7  --  0.3  --   --   --   --   PROT 6.0*  --  5.8*  --  4.7*  --   --   --   --   ALBUMIN 1.5*  < > 1.7*  < > 1.3*  < > 1.8* 2.1* 2.1*  < > = values in this interval not displayed. No results for input(s): LIPASE, AMYLASE in the last 8760 hours.  Recent Labs  06/22/16 0334 06/23/16 0332 06/29/16 1130  AMMONIA 72* 60* 21   CBC:  Recent Labs  09/10/16 0419  12/21/16 1347   12/26/16 2350 12/28/16 0732 02/06/17 2057 02/07/17 02/23/17  WBC 12.0*  < > 11.1*  < > 12.4* 15.6* 12.5* 11.4 8.9  NEUTROABS 8.0*  --  9.4*  --   --   --  10.4*  --   --   HGB 9.7*  < > 9.2*  < > 9.6* 10.0* 9.3* 8.9* 8.8*  HCT 29.9*  < > 28.6*  < > 29.5* 30.1* 29.2* 28* 27*  MCV 87.9  --  83.1  < > 83.6 82.9 86.6  --   --   PLT 327  < > 211  < > 241 205 223 208 201  < > = values in this interval not displayed. TSH:  Recent Labs  08/22/16 0653 11/03/16 02/07/17  TSH 2.277 2.46 3.72   A1C: Lab Results  Component Value Date   HGBA1C 6.5 11/29/2016   Lipid Panel:  Recent Labs  08/09/16 2026  TRIG 69   Chest X-Ray AP/LAT: 12/20/16 PA and lateral chest reveal heart and mediastinum to be normal. Left basilar infiltrate present and costophrenic angles  are clear.  IMPRESSION: Left basilar infiltrate  Dg Chest 2 View  Result Date: 02/06/2017 CLINICAL DATA:  Chest pain. Recently diagnosed with pneumonia. Some hemoptysis. EXAM: CHEST  2 VIEW COMPARISON:  12/27/2016 FINDINGS: The heart size and mediastinal contours are within normal limits. There is aortic atherosclerosis without aneurysm. Chronic mild interstitial prominence is noted of the lungs without pneumonic consolidation, effusion or pneumothorax. Degenerative changes are seen along the dorsal spine. IMPRESSION: No active cardiopulmonary disease. Chronic mild interstitial prominence may reflect chronic bronchitic change. Electronically Signed   By: Ashley Royalty M.D.   On: 02/06/2017 22:00    Assessment/Plan 1. Hemoptysis Improved; see number 6 Venous doppler was done however do not have the report. Staff is looking to get report at this time.   2. Chronic systolic HF (heart failure) (HCC) -stable. Weight gain noted however pt with only trace edema, no shortness of breath, congestion noted. conts on lasix and metorpolol   3. Chronic bronchitis, unspecified chronic bronchitis type ([place]) Stable, has appt with pulmonary due to  stridor and hemoptysis, conts on O2 and pulmicort.   4. Chronic respiratory failure with hypoxia (HCC) Cont on 2L [place], staff has reported intermitted stridor and pulmonary referral was placed. She is s/p trach removal. No shortness of breath, cough, congestion, wheezing at this time.   5. Type 2 diabetes mellitus with diabetic nephropathy, unspecified whether long term insulin use (HCC) Blood sugars ranging from 97-271, mostly in the high 200s and some in the 300s. Will follow up A1c at [facility] stime. conts on levemir 5 units q day.   6. Iron deficiency anemia due to chronic blood loss Pt was having hemoptysis but none recently reported.  -Will increase iron to BID -to follow up cbc, TIBC, ferritin, iron level -will have staff hemoccult stools x 3   Kineta Fudala K. Harle Battiest  [facility] [phone] 8 am - 5 pm) [phone] (after hours)

## 2017-03-10 DIAGNOSIS — E649 Sequelae of unspecified nutritional deficiency: Secondary | ICD-10-CM | POA: Diagnosis not present

## 2017-03-10 DIAGNOSIS — D649 Anemia, unspecified: Secondary | ICD-10-CM | POA: Diagnosis not present

## 2017-03-10 DIAGNOSIS — E119 Type 2 diabetes mellitus without complications: Secondary | ICD-10-CM | POA: Diagnosis not present

## 2017-03-10 LAB — CBC AND DIFFERENTIAL
HCT: 26 — AB (ref 36–46)
Hemoglobin: 8.7 — AB (ref 12.0–16.0)
Neutrophils Absolute: 6
Platelets: 194 (ref 150–399)
WBC: 8.7

## 2017-03-10 LAB — HEMOGLOBIN A1C: HEMOGLOBIN A1C: 7.4

## 2017-03-20 ENCOUNTER — Encounter (HOSPITAL_COMMUNITY): Payer: Self-pay

## 2017-03-20 ENCOUNTER — Emergency Department (HOSPITAL_COMMUNITY): Payer: Medicare Other

## 2017-03-20 ENCOUNTER — Non-Acute Institutional Stay (SKILLED_NURSING_FACILITY): Payer: Medicare Other | Admitting: Internal Medicine

## 2017-03-20 ENCOUNTER — Encounter: Payer: Self-pay | Admitting: Internal Medicine

## 2017-03-20 ENCOUNTER — Inpatient Hospital Stay (HOSPITAL_COMMUNITY)
Admission: EM | Admit: 2017-03-20 | Discharge: 2017-03-23 | DRG: 291 | Disposition: A | Discharge status: Skilled Nursing Facility | Payer: Medicare Other | Attending: Internal Medicine | Admitting: Internal Medicine

## 2017-03-20 DIAGNOSIS — J385 Laryngeal spasm: Secondary | ICD-10-CM | POA: Diagnosis present

## 2017-03-20 DIAGNOSIS — E649 Sequelae of unspecified nutritional deficiency: Secondary | ICD-10-CM | POA: Diagnosis not present

## 2017-03-20 DIAGNOSIS — E040 Nontoxic diffuse goiter: Secondary | ICD-10-CM | POA: Diagnosis present

## 2017-03-20 DIAGNOSIS — D649 Anemia, unspecified: Secondary | ICD-10-CM | POA: Diagnosis not present

## 2017-03-20 DIAGNOSIS — Z6835 Body mass index (BMI) 35.0-35.9, adult: Secondary | ICD-10-CM | POA: Diagnosis not present

## 2017-03-20 DIAGNOSIS — J9621 Acute and chronic respiratory failure with hypoxia: Secondary | ICD-10-CM | POA: Diagnosis present

## 2017-03-20 DIAGNOSIS — Z87891 Personal history of nicotine dependence: Secondary | ICD-10-CM | POA: Diagnosis not present

## 2017-03-20 DIAGNOSIS — Z833 Family history of diabetes mellitus: Secondary | ICD-10-CM

## 2017-03-20 DIAGNOSIS — K5909 Other constipation: Secondary | ICD-10-CM | POA: Diagnosis present

## 2017-03-20 DIAGNOSIS — N179 Acute kidney failure, unspecified: Secondary | ICD-10-CM | POA: Diagnosis not present

## 2017-03-20 DIAGNOSIS — E042 Nontoxic multinodular goiter: Secondary | ICD-10-CM | POA: Diagnosis not present

## 2017-03-20 DIAGNOSIS — Z79899 Other long term (current) drug therapy: Secondary | ICD-10-CM | POA: Diagnosis not present

## 2017-03-20 DIAGNOSIS — E662 Morbid (severe) obesity with alveolar hypoventilation: Secondary | ICD-10-CM | POA: Diagnosis not present

## 2017-03-20 DIAGNOSIS — Z7982 Long term (current) use of aspirin: Secondary | ICD-10-CM

## 2017-03-20 DIAGNOSIS — R061 Stridor: Secondary | ICD-10-CM | POA: Diagnosis not present

## 2017-03-20 DIAGNOSIS — E785 Hyperlipidemia, unspecified: Secondary | ICD-10-CM | POA: Diagnosis present

## 2017-03-20 DIAGNOSIS — I5033 Acute on chronic diastolic (congestive) heart failure: Secondary | ICD-10-CM | POA: Diagnosis present

## 2017-03-20 DIAGNOSIS — R32 Unspecified urinary incontinence: Secondary | ICD-10-CM | POA: Diagnosis present

## 2017-03-20 DIAGNOSIS — Z7951 Long term (current) use of inhaled steroids: Secondary | ICD-10-CM | POA: Diagnosis not present

## 2017-03-20 DIAGNOSIS — F0390 Unspecified dementia without behavioral disturbance: Secondary | ICD-10-CM | POA: Diagnosis not present

## 2017-03-20 DIAGNOSIS — I130 Hypertensive heart and chronic kidney disease with heart failure and stage 1 through stage 4 chronic kidney disease, or unspecified chronic kidney disease: Principal | ICD-10-CM | POA: Diagnosis present

## 2017-03-20 DIAGNOSIS — Z9981 Dependence on supplemental oxygen: Secondary | ICD-10-CM

## 2017-03-20 DIAGNOSIS — K219 Gastro-esophageal reflux disease without esophagitis: Secondary | ICD-10-CM | POA: Diagnosis present

## 2017-03-20 DIAGNOSIS — M50321 Other cervical disc degeneration at C4-C5 level: Secondary | ICD-10-CM | POA: Diagnosis not present

## 2017-03-20 DIAGNOSIS — Z794 Long term (current) use of insulin: Secondary | ICD-10-CM

## 2017-03-20 DIAGNOSIS — I10 Essential (primary) hypertension: Secondary | ICD-10-CM | POA: Diagnosis not present

## 2017-03-20 DIAGNOSIS — E119 Type 2 diabetes mellitus without complications: Secondary | ICD-10-CM | POA: Diagnosis not present

## 2017-03-20 DIAGNOSIS — J449 Chronic obstructive pulmonary disease, unspecified: Secondary | ICD-10-CM | POA: Diagnosis present

## 2017-03-20 DIAGNOSIS — Z930 Tracheostomy status: Secondary | ICD-10-CM | POA: Diagnosis not present

## 2017-03-20 DIAGNOSIS — E049 Nontoxic goiter, unspecified: Secondary | ICD-10-CM | POA: Diagnosis not present

## 2017-03-20 DIAGNOSIS — M50322 Other cervical disc degeneration at C5-C6 level: Secondary | ICD-10-CM | POA: Diagnosis not present

## 2017-03-20 DIAGNOSIS — N183 Chronic kidney disease, stage 3 unspecified: Secondary | ICD-10-CM | POA: Diagnosis present

## 2017-03-20 DIAGNOSIS — F0150 Vascular dementia without behavioral disturbance: Secondary | ICD-10-CM | POA: Diagnosis present

## 2017-03-20 DIAGNOSIS — E0590 Thyrotoxicosis, unspecified without thyrotoxic crisis or storm: Secondary | ICD-10-CM

## 2017-03-20 DIAGNOSIS — M5031 Other cervical disc degeneration,  high cervical region: Secondary | ICD-10-CM | POA: Diagnosis not present

## 2017-03-20 DIAGNOSIS — R0682 Tachypnea, not elsewhere classified: Secondary | ICD-10-CM | POA: Diagnosis not present

## 2017-03-20 DIAGNOSIS — R0902 Hypoxemia: Secondary | ICD-10-CM

## 2017-03-20 DIAGNOSIS — J45909 Unspecified asthma, uncomplicated: Secondary | ICD-10-CM | POA: Diagnosis not present

## 2017-03-20 DIAGNOSIS — E0500 Thyrotoxicosis with diffuse goiter without thyrotoxic crisis or storm: Secondary | ICD-10-CM | POA: Diagnosis present

## 2017-03-20 DIAGNOSIS — R221 Localized swelling, mass and lump, neck: Secondary | ICD-10-CM | POA: Diagnosis not present

## 2017-03-20 DIAGNOSIS — Q315 Congenital laryngomalacia: Secondary | ICD-10-CM | POA: Diagnosis present

## 2017-03-20 DIAGNOSIS — R0602 Shortness of breath: Secondary | ICD-10-CM | POA: Diagnosis not present

## 2017-03-20 DIAGNOSIS — Z8673 Personal history of transient ischemic attack (TIA), and cerebral infarction without residual deficits: Secondary | ICD-10-CM

## 2017-03-20 DIAGNOSIS — A0471 Enterocolitis due to Clostridium difficile, recurrent: Secondary | ICD-10-CM | POA: Diagnosis not present

## 2017-03-20 DIAGNOSIS — M47892 Other spondylosis, cervical region: Secondary | ICD-10-CM | POA: Diagnosis not present

## 2017-03-20 DIAGNOSIS — R079 Chest pain, unspecified: Secondary | ICD-10-CM | POA: Diagnosis not present

## 2017-03-20 DIAGNOSIS — I25110 Atherosclerotic heart disease of native coronary artery with unstable angina pectoris: Secondary | ICD-10-CM | POA: Diagnosis not present

## 2017-03-20 DIAGNOSIS — J9600 Acute respiratory failure, unspecified whether with hypoxia or hypercapnia: Secondary | ICD-10-CM | POA: Diagnosis not present

## 2017-03-20 DIAGNOSIS — E1122 Type 2 diabetes mellitus with diabetic chronic kidney disease: Secondary | ICD-10-CM | POA: Diagnosis present

## 2017-03-20 LAB — BASIC METABOLIC PANEL
Anion gap: 9 (ref 5–15)
BUN: 36 mg/dL — ABNORMAL HIGH (ref 6–20)
CO2: 24 mmol/L (ref 22–32)
Calcium: 9.3 mg/dL (ref 8.9–10.3)
Chloride: 105 mmol/L (ref 101–111)
Creatinine, Ser: 1.58 mg/dL — ABNORMAL HIGH (ref 0.44–1.00)
GFR calc Af Amer: 39 mL/min — ABNORMAL LOW (ref >60)
GFR calc non Af Amer: 34 mL/min — ABNORMAL LOW (ref >60)
Glucose, Bld: 211 mg/dL — ABNORMAL HIGH (ref 65–99)
Potassium: 4.5 mmol/L (ref 3.5–5.1)
Sodium: 138 mmol/L (ref 135–145)

## 2017-03-20 LAB — PROCALCITONIN: Procalcitonin: <0.1 ng/mL

## 2017-03-20 LAB — CBC WITH DIFFERENTIAL/PLATELET
Basophils Absolute: 0 10*3/uL (ref 0.0–0.1)
Basophils Relative: 0 %
Eosinophils Absolute: 0.1 10*3/uL (ref 0.0–0.7)
Eosinophils Relative: 1 %
HCT: 29.6 % — ABNORMAL LOW (ref 36.0–46.0)
Hemoglobin: 9.3 g/dL — ABNORMAL LOW (ref 12.0–15.0)
Lymphocytes Relative: 10 %
Lymphs Abs: 1.1 10*3/uL (ref 0.7–4.0)
MCH: 27.4 pg (ref 26.0–34.0)
MCHC: 31.4 g/dL (ref 30.0–36.0)
MCV: 87.3 fL (ref 78.0–100.0)
Monocytes Absolute: 0.7 10*3/uL (ref 0.1–1.0)
Monocytes Relative: 5 %
Neutro Abs: 10.1 10*3/uL — ABNORMAL HIGH (ref 1.7–7.7)
Neutrophils Relative %: 84 %
Platelets: 216 10*3/uL (ref 150–400)
RBC: 3.39 MIL/uL — ABNORMAL LOW (ref 3.87–5.11)
RDW: 14.3 % (ref 11.5–15.5)
WBC: 11.9 10*3/uL — ABNORMAL HIGH (ref 4.0–10.5)

## 2017-03-20 LAB — GLUCOSE, CAPILLARY: GLUCOSE-CAPILLARY: 271 mg/dL — AB (ref 65–99)

## 2017-03-20 LAB — CBC AND DIFFERENTIAL
HEMATOCRIT: 30 — AB (ref 36–46)
Hemoglobin: 9.1 — AB (ref 12.0–16.0)
NEUTROS ABS: 6
Platelets: 209 (ref 150–399)
WBC: 8.3

## 2017-03-20 LAB — MRSA PCR SCREENING: MRSA by PCR: NEGATIVE

## 2017-03-20 LAB — I-STAT TROPONIN, ED: Troponin i, poc: 0 ng/mL (ref 0.00–0.08)

## 2017-03-20 LAB — BRAIN NATRIURETIC PEPTIDE: B Natriuretic Peptide: 70.8 pg/mL (ref 0.0–100.0)

## 2017-03-20 MED ORDER — BUDESONIDE 0.5 MG/2ML IN SUSP
0.5000 mg | Freq: Two times a day (BID) | RESPIRATORY_TRACT | Status: DC
  Administered 2017-03-21 – 2017-03-23 (×5): 0.5 mg via RESPIRATORY_TRACT
  Filled 2017-03-20 (×6): qty 2

## 2017-03-20 MED ORDER — DEXAMETHASONE SODIUM PHOSPHATE 10 MG/ML IJ SOLN
10.0000 mg | Freq: Once | INTRAMUSCULAR | Status: AC
  Administered 2017-03-20: 10 mg via INTRAVENOUS
  Filled 2017-03-20: qty 1

## 2017-03-20 MED ORDER — HEPARIN SODIUM (PORCINE) 5000 UNIT/ML IJ SOLN
5000.0000 [IU] | Freq: Three times a day (TID) | INTRAMUSCULAR | Status: DC
  Administered 2017-03-20 – 2017-03-23 (×8): 5000 [IU] via SUBCUTANEOUS
  Filled 2017-03-20 (×8): qty 1

## 2017-03-20 MED ORDER — MEMANTINE HCL 10 MG PO TABS
10.0000 mg | ORAL_TABLET | Freq: Two times a day (BID) | ORAL | Status: DC
  Administered 2017-03-20 – 2017-03-23 (×6): 10 mg via ORAL
  Filled 2017-03-20 (×6): qty 1

## 2017-03-20 MED ORDER — FUROSEMIDE 20 MG PO TABS
10.0000 mg | ORAL_TABLET | Freq: Every day | ORAL | Status: DC
  Administered 2017-03-21: 10 mg via ORAL
  Filled 2017-03-20: qty 1

## 2017-03-20 MED ORDER — ACETAMINOPHEN 650 MG RE SUPP: 650.0000 mg | Freq: Four times a day (QID) | RECTAL | Status: DC | PRN

## 2017-03-20 MED ORDER — DOCUSATE SODIUM 100 MG PO CAPS
100.0000 mg | ORAL_CAPSULE | Freq: Every day | ORAL | Status: DC
  Administered 2017-03-21 – 2017-03-23 (×3): 100 mg via ORAL
  Filled 2017-03-20 (×3): qty 1

## 2017-03-20 MED ORDER — ACETAMINOPHEN 325 MG PO TABS: 650.0000 mg | ORAL_TABLET | Freq: Four times a day (QID) | ORAL | Status: DC | PRN

## 2017-03-20 MED ORDER — FERROUS SULFATE 325 (65 FE) MG PO TABS
325.0000 mg | ORAL_TABLET | Freq: Two times a day (BID) | ORAL | Status: DC
  Administered 2017-03-21 – 2017-03-23 (×5): 325 mg via ORAL
  Filled 2017-03-20 (×5): qty 1

## 2017-03-20 MED ORDER — ATORVASTATIN CALCIUM 40 MG PO TABS
40.0000 mg | ORAL_TABLET | Freq: Every evening | ORAL | Status: DC
  Administered 2017-03-20 – 2017-03-22 (×3): 40 mg via ORAL
  Filled 2017-03-20 (×3): qty 1

## 2017-03-20 MED ORDER — ASPIRIN 81 MG PO CHEW
81.0000 mg | CHEWABLE_TABLET | Freq: Every day | ORAL | Status: DC
  Administered 2017-03-20 – 2017-03-23 (×4): 81 mg via ORAL
  Filled 2017-03-20 (×4): qty 1

## 2017-03-20 MED ORDER — METHIMAZOLE 10 MG PO TABS
10.0000 mg | ORAL_TABLET | Freq: Every day | ORAL | Status: DC
  Administered 2017-03-20 – 2017-03-23 (×4): 10 mg via ORAL
  Filled 2017-03-20 (×4): qty 1

## 2017-03-20 MED ORDER — RENA-VITE PO TABS
1.0000 | ORAL_TABLET | Freq: Every day | ORAL | Status: DC
  Administered 2017-03-20 – 2017-03-22 (×3): 1 via ORAL
  Filled 2017-03-20 (×3): qty 1

## 2017-03-20 MED ORDER — INSULIN DETEMIR 100 UNIT/ML ~~LOC~~ SOLN
5.0000 [IU] | Freq: Every day | SUBCUTANEOUS | Status: DC
  Administered 2017-03-20 – 2017-03-22 (×3): 5 [IU] via SUBCUTANEOUS
  Filled 2017-03-20 (×3): qty 0.05

## 2017-03-20 MED ORDER — INSULIN ASPART 100 UNIT/ML ~~LOC~~ SOLN
0.0000 [IU] | Freq: Three times a day (TID) | SUBCUTANEOUS | Status: DC
  Administered 2017-03-21: 1 [IU] via SUBCUTANEOUS
  Administered 2017-03-21: 2 [IU] via SUBCUTANEOUS
  Administered 2017-03-21 – 2017-03-22 (×2): 3 [IU] via SUBCUTANEOUS
  Administered 2017-03-22: 1 [IU] via SUBCUTANEOUS
  Administered 2017-03-22: 5 [IU] via SUBCUTANEOUS

## 2017-03-20 MED ORDER — MAGNESIUM HYDROXIDE 400 MG/5ML PO SUSP: 30.0000 mL | Freq: Every day | ORAL | Status: DC | PRN

## 2017-03-20 MED ORDER — FAMOTIDINE 20 MG PO TABS
20.0000 mg | ORAL_TABLET | Freq: Every day | ORAL | Status: DC
  Administered 2017-03-21 – 2017-03-23 (×3): 20 mg via ORAL
  Filled 2017-03-20 (×4): qty 1

## 2017-03-20 MED ORDER — FUROSEMIDE 10 MG/ML IJ SOLN
40.0000 mg | Freq: Once | INTRAMUSCULAR | Status: AC
  Administered 2017-03-20: 40 mg via INTRAVENOUS
  Filled 2017-03-20: qty 4

## 2017-03-20 MED ORDER — MELATONIN 3 MG PO TABS
3.0000 mg | ORAL_TABLET | Freq: Every day | ORAL | Status: DC
  Administered 2017-03-20 – 2017-03-22 (×3): 3 mg via ORAL
  Filled 2017-03-20 (×3): qty 1

## 2017-03-20 MED ORDER — AMLODIPINE BESYLATE 5 MG PO TABS
5.0000 mg | ORAL_TABLET | Freq: Every day | ORAL | Status: DC
  Administered 2017-03-21 – 2017-03-23 (×3): 5 mg via ORAL
  Filled 2017-03-20 (×4): qty 1

## 2017-03-20 MED ORDER — IPRATROPIUM-ALBUTEROL 0.5-2.5 (3) MG/3ML IN SOLN
3.0000 mL | Freq: Once | RESPIRATORY_TRACT | Status: AC
  Administered 2017-03-20: 3 mL via RESPIRATORY_TRACT
  Filled 2017-03-20: qty 3

## 2017-03-20 MED ORDER — IPRATROPIUM-ALBUTEROL 0.5-2.5 (3) MG/3ML IN SOLN
3.0000 mL | RESPIRATORY_TRACT | Status: DC | PRN
  Filled 2017-03-20: qty 3

## 2017-03-20 MED ORDER — METOPROLOL TARTRATE 25 MG PO TABS
50.0000 mg | ORAL_TABLET | Freq: Two times a day (BID) | ORAL | Status: DC
  Administered 2017-03-20 – 2017-03-23 (×6): 50 mg via ORAL
  Filled 2017-03-20 (×6): qty 2

## 2017-03-20 MED ORDER — ONDANSETRON HCL 4 MG PO TABS: 4.0000 mg | ORAL_TABLET | Freq: Three times a day (TID) | ORAL | Status: DC | PRN

## 2017-03-20 MED ORDER — INSULIN ASPART 100 UNIT/ML ~~LOC~~ SOLN
0.0000 [IU] | Freq: Every day | SUBCUTANEOUS | Status: DC
  Administered 2017-03-20: 3 [IU] via SUBCUTANEOUS
  Administered 2017-03-22: 2 [IU] via SUBCUTANEOUS

## 2017-03-20 MED ORDER — PROMETHAZINE HCL 25 MG PO TABS: 12.5000 mg | ORAL_TABLET | Freq: Four times a day (QID) | ORAL | Status: DC | PRN

## 2017-03-20 NOTE — ED Notes (Signed)
Pt was incontinent on urine. Peri care performed and linens changed.

## 2017-03-20 NOTE — ED Notes (Signed)
Pt incontinent of urine. Peri care performed and linens changed.

## 2017-03-20 NOTE — Patient Instructions (Signed)
Patient will be transferred to the emergency room for evaluation of the acute clinical picture.

## 2017-03-20 NOTE — Consult Note (Signed)
Name: Angelica Beck MRN: [id number] DOB: 05-01-1951    ADMISSION DATE:  03/20/2017 CONSULTATION DATE:  03/20/2017  REFERRING MD :  Dr. Marily Memos  CHIEF COMPLAINT:  Hypoxia   HISTORY OF PRESENT ILLNESS:  Patient is confused and therefore HPI obtained from chart review.  66 year old female with PMH of dementia, OSH, asthma, grade 1 diastolic HF (EF 09% 3/26), DM, HLD, HTN, hyperthyroidism, and CVA who presented with hypoxia and shortness of breath admitted to [facility] as primary service.  Additionally, patient had a thyroid isthmusectomy and tracheostomy placement 06/2016 due to acute respiratory failure with failed extubation attempts secondary to a large thyroid goiter.  She was decannulated in our trach clinic in 09/2016.  She is followed by Dr. Halford Chessman for OHS and on 2L O2 at night.  Patient is a resident of [facility] and a full code.  She represented with one day history of shortness of breath and found to have room air sats of 58% on room air.  Her PCP treated with neb and O2 and was sent to ER.  She has been afebrile and 100% on 2L nasal cannula.  WBC 11.9, PCT <0.10.  Additioinally had some upper airway stridor and was treated with decadron. She was admitted for acute on chronic respiratory failure and mild pulmonary edema by TRH.  PCCM consulted for recommendations for acute on chronic respiratory failure.   PAST MEDICAL HISTORY :   has a past medical history of Arthritis; Asthma; Diabetes mellitus, type 2 ([place]); Hyperlipidemia; Hypertension; Hyperthyroidism; Obesity hypoventilation syndrome ([place]) (06/20/2013); Stroke [facility]); and Vascular dementia.  has a past surgical history that includes Ventral hernia repair; ir generic historical (07/12/2016); ir generic historical (07/12/2016); Tracheostomy tube placement (N/A, 07/14/2016); ir generic historical (07/17/2016); ir generic historical (08/23/2016); and ir generic historical (11/09/2016). Prior to Admission medications   Medication Sig Start  Date End Date Taking? Authorizing Provider  amLODipine (NORVASC) 5 MG tablet Take 1 tablet (5 mg total) by mouth daily. 12/24/16  Yes Johnson, Clanford L, MD  aspirin 81 MG chewable tablet Chew 81 mg by mouth daily.   Yes [provider]  atorvastatin (LIPITOR) 40 MG tablet Take 40 mg by mouth every evening.    Yes [provider]  b complex-vitamin c-folic acid (NEPHRO-VITE) 0.8 MG TABS tablet Take 1 tablet by mouth every morning.   Yes [provider]  bisacodyl (DULCOLAX) 10 MG suppository Place 10 mg rectally daily as needed for moderate constipation.   Yes [provider]  budesonide (PULMICORT) 0.5 MG/2ML nebulizer solution Take 0.5 mg by nebulization 2 (two) times daily.   Yes [provider]  cholecalciferol (VITAMIN D) 1000 units tablet Take 1,000 Units by mouth daily.   Yes [provider]  docusate sodium (COLACE) 100 MG capsule Take 100 mg by mouth daily.   Yes [provider]  famotidine (PEPCID) 20 MG tablet Take 20 mg by mouth daily.   Yes [provider]  ferrous sulfate 325 (65 FE) MG tablet Take 325 mg by mouth 2 (two) times daily with a meal.   Yes [provider]  furosemide (LASIX) 20 MG tablet Take 10 mg by mouth daily. Take 1/2 tablet to = 10 mg by mouth once daily for CHF/Edema 02/05/17  Yes [provider]  insulin detemir (LEVEMIR) 100 UNIT/ML injection Inject 5 Units into the skin at bedtime.   Yes [provider]  Melatonin 3 MG TABS Take 3 mg by mouth at bedtime.  Yes [provider]  memantine (NAMENDA) 10 MG tablet Take 10 mg by mouth 2 (two) times daily.    Yes [provider]  methimazole (TAPAZOLE) 10 MG tablet Take 10 mg by mouth daily.    Yes [provider]  metoprolol (LOPRESSOR) 50 MG tablet Take 50 mg by mouth 2 (two) times daily.   Yes [provider]  trolamine salicylate (ASPERCREME) 10 % cream Apply 1 application topically 2  (two) times daily.    Yes [provider]  acetaminophen (TYLENOL) 500 MG tablet Take 500 mg by mouth every 6 (six) hours as needed for mild pain.     [provider]  ipratropium-albuterol (DUONEB) 0.5-2.5 (3) MG/3ML SOLN Take 3 mLs by nebulization every 4 (four) hours as needed (for dyspnea).    [provider]  magnesium hydroxide (MILK OF MAGNESIA) 400 MG/5ML suspension Take 30 mLs by mouth daily as needed for mild constipation.    [provider]  ondansetron (ZOFRAN) 4 MG tablet Take 4 mg by mouth every 8 (eight) hours as needed for nausea or vomiting.    [provider]  Sodium Phosphates (RA SALINE ENEMA RE) Place 1 Applicatorful rectally daily as needed (constipation).    [provider]   Allergies  Allergen Reactions  . Penicillins Swelling    FACIAL SWELLING Has patient had a PCN reaction causing immediate rash, facial/tongue/throat swelling, SOB or lightheadedness with hypotension: Yes Has patient had a PCN reaction causing severe rash involving mucus membranes or skin necrosis: No Has patient had a PCN reaction that required hospitalization: No Has patient had a PCN reaction occurring within the last 10 years: No If all of the above answers are "NO", then may proceed with Cephalosporin use.   . Lactose Intolerance (Gi) Diarrhea and Nausea And Vomiting    FAMILY HISTORY:  family history includes Diabetes in her mother. SOCIAL HISTORY:  reports that she quit smoking about 14 years ago. Her smoking use included Cigarettes. She has a 7.50 pack-year smoking history. She has never used smokeless tobacco. She reports that she does not drink alcohol or use drugs.  REVIEW OF SYSTEMS:   Patient is confused however has no complaints.    SUBJECTIVE:   VITAL SIGNS: Temp:  [97.4 F (36.3 C)-98.4 F (36.9 C)] 98.3 F (36.8 C) (06/26 2006) Pulse Rate:  [72-115] 83 (06/26 2006) Resp:  [14-28] 22 (06/26 2006) BP: (111-180)/(51-92)  176/69 (06/26 2006) SpO2:  [68 %-100 %] 100 % (06/26 2006) Weight:  [220 lb (99.8 kg)-223 lb 8 oz (101.4 kg)] 223 lb 8 oz (101.4 kg) (06/26 1832)  PHYSICAL EXAMINATION: General:  Adult female sitting in bed in NAD HEENT: MM pink/moist, AT/[place], previous trach scar, large goiter with positional stridor, unable to appreciate JVD secondary to goiter, soft voice  Neuro:  Sleepy, oriented to person and place.  Follows commands.  CV: s1s2 rrr, no m/r/g PULM: Shallow/non-labored, lungs bilaterally diminished with faint RLL rales, on 2l [place] at 100%, no wheezing GI: obese, soft, non-tender, bsx4 active  Extremities: warm/dry, +2 BLE pitting edema  Skin: no rashes or lesions   Recent Labs Lab 03/20/17 1416  NA 138  K 4.5  CL 105  CO2 24  BUN 36*  CREATININE 1.58*  GLUCOSE 211*    Recent Labs Lab 03/20/17 03/20/17 1416  HGB 9.1* 9.3*  HCT 30* 29.6*  WBC 8.3 11.9*  PLT 209 216   Dg Chest 2 View  Result Date: 03/20/2017 CLINICAL DATA:  Shortness of breath without chest pain. History of asthma, diabetes, and hypertension. Nonsmoker. EXAM: CHEST  2 VIEW COMPARISON:  Chest x-ray dated Feb 06, 2017 FINDINGS: The lungs are borderline hypoinflated. The interstitial markings are mildly increased. The pulmonary vascularity is engorged. The cardiac silhouette is enlarged. There is no pleural effusion or alveolar infiltrate. The bony thorax exhibits no acute abnormality. There is deviation of the trachea toward the right which is stable. IMPRESSION: CHF with mild pulmonary interstitial edema. No alveolar pneumonia or pleural effusion. Chronic deviation of the trachea toward the right is more conspicuous than on the studies of earlier this year. This may be secondary to a goiter or to other superior mediastinal mass. Thyroid ultrasound would be a useful next imaging step. Electronically Signed   By: David  Martinique M.D.   On: 03/20/2017 12:40    ASSESSMENT / PLAN:  Acute on chronic hypoxic respiratory  failure Acute on chronic diastolic heart failure OHS Asthma/ COPD  Chronic enlarged goiter s/p thyroid isthmusectomy in 06/2016, biopsy neg for malignancy  Hyperthyroidisim Dementia  - agree that this is likely multifactorial but mainly related to acute on chronic diastolic heart failure given her pulmonary edema on CXR and BLE edema.  Her weight is up ~10 lbs this admit compared to weight of 211 lbs on 12/20/16.  Her hypoxia has improved and is now stable on 2L [place] at 100%.  Her stridor is positional with chronic right tracheal deviation 2/2 chronic obstructive goiter, questionably slightly more than prior but chronic.  She does not appear infectious, PCT is reassuring. - no formal PFTs noted.    P:  - Would continue diuresis as tolerated and reassess CXR - monitor I/O's which will be somewhat difficult given urinary incontinence  - continue O2 for goal Spo2 > 92% and HS  - Doubt steroids will improve stridor, stridor is positional with head position on my exam - continue budesonide BID, duonebs q 4 hr PRN - Aggressive pulmonary hygiene  - caution/ limit sedating medications  - would defer future thyroidectomy decisions to ENT  Kennieth Rad, ACNP Pulmonary and [place] Pager: [phone]  03/20/2017, 9:38 PM

## 2017-03-20 NOTE — Progress Notes (Signed)
    This is a nursing facility follow up for specific acute issue of hypoxia.  Interim medical record and care since last [place] visit was updated with review of diagnostic studies and change in clinical status since last visit were documented.  HPI: Her nurse noted that the patient was short of breath despite nasal oxygen O2 sats were as low as 58%. She received a nebulizer treatment and O2 was increased to 4 L. Following this the O2 sats did increase to 93% but again O2 sats exhibited trending downward. The patient has dementia and is unable give any meaningful history. She does admit to being shortness of breath but denies chest pain, abdominal pain or any other acute symptoms.  Significant past history includes tracheostomy and thyroid isthmusectomy 07/14/16 by Dr. Redmond Baseman, ENT. The patient removed the trach 5 times before it was finally removed 10/04/16 in trach clinic by Dr. Titus Mould. Since removal of the trach the patient has had intermittent stridor of the upper airway and intermittent hemoptysis. Follow-up with Dr. Titus Mould to evaluate the intermittent stridor and hemoptysis is pending. The patient has hyperthyroidism and goiter, she is currently on methimazole. Most recent TSH was 3.72 on 02/07/17. The patient previously was seen by palliative care;the patient's daughter is insistent that she remain a full code.  Review of systems: Dementia invalidated responses.  Constitutional: No fever,significant weight change, fatigue  Respiratory: No cough, sputum production,hemoptysis,  Gastrointestinal: No heartburn,dysphagia,abdominal pain, nausea / vomiting,rectal bleeding, melena,change in bowels  Physical exam:  Pertinent or positive findings: The patient is leaning forward in the wheelchair on O2 and receiving nebulizer treatment.  Voice is whispered and raspy. She is edentulous, the lower lip is everted. Heart sounds are rapid and irregular and distant. Breath sounds  are also decreased but she does have bilateral rhonchi/rales. She exhibits stridorous quality to her respirations over the neck. There is a large asymmetric goiter, greater on the right than the left. Abdomen is protuberant. She has 1+ pedal edema.Posterior tibial pulses are decreased.  Homans sign is clinically negative.  She is generally weak especially in the right lower extremity  General appearance:Adequately nourished.   Lymphatic: No lymphadenopathy about the head, neck, axilla . Eyes: No conjunctival inflammation or lid edema is present. There is no scleral icterus. Ears:  External ear exam shows no significant lesions or deformities.   Nose:  External nasal examination shows no deformity or inflammation. Nasal mucosa are pink and moist without lesions ,exudates Oral exam: lips and gums are healthy appearing. Abdomen:Bowel sounds are decreased. Abdomen is soft and nontender with no organomegaly, hernias,masses. GU: deferred  Extremities:  No cyanosis, clubbing Skin: Warm & dry w/o tenting. No significant lesions or rash.  #1 acute hypoxia with stridorous upper airway respirations in context of  goiter and previous tracheostomy and thyroid isthmusectomy #2 cognitive impairment and dementia in the setting of previous cerebrovascular accident #3 obesity hypoventilation syndrome #4 goiter with current methimazole therapy #5 Full Code status as per HCPOA, her daughter Plan: ED referral as high risk of acute respiratory failure due to upper airway compromise

## 2017-03-20 NOTE — ED Provider Notes (Signed)
[facility] DEPT Provider Note   CSN: [id number] Arrival date & time: 03/20/17  1110     History   Chief Complaint Chief Complaint  Patient presents with  . Shortness of Breath    HPI Angelica Beck is a 66 y.o. female.  HPI level 5 caveat due to patient's baseline mental status  66 year old female presents today from [place] living in rehabilitation with acute shortness of breath.  I spoke with nursing staff who report that patient has a history of asthma.  She was having some wheezing this morning, they attempted giving her her morning breathing treatment which did not improve her symptoms.  She was in respiratory distress, was evaluated by on-call doctor there who recommended following up with the emergency room.  Patient does have a history of tracheostomy, and also a goiter that is being followed.  They noted her oxygen saturations were extremely low, and felt patient may pass out.  At the time of evaluation patient reports she is feeling back to her baseline.  She notes shortness of breath this morning, she denies any chest pain, fever, cough, lower extremity swelling or edema, or any other significant etiology.    Past Medical History:  Diagnosis Date  . Arthritis   . Asthma    09/07/16  . Diabetes mellitus, type 2 ([place])   . Hyperlipidemia   . Hypertension   . Hyperthyroidism   . Obesity hypoventilation syndrome ([place]) 06/20/2013  . Stroke ([place])   . Vascular dementia     Patient Active Problem List   Diagnosis Date Noted  . Hemoptysis 02/22/2017  . Stroke ([place])   . History of hemoptysis 02/16/2017  . Chronic bronchitis ([place]) 01/25/2017  . Acute bronchitis 12/27/2016  . CKD (chronic kidney disease) stage 3, GFR 30-59 ml/min 12/27/2016  . PNA (pneumonia) 12/21/2016  . Edema 11/02/2016  . Diastolic dysfunction 94/17/4081  . Osteoarthritis of both knees 08-10-202018  . At risk for aspiration 10/03/2016  . Normocytic normochromic anemia 09/07/2016  . Acute  on chronic respiratory failure ([place])   . (HFpEF) heart failure with preserved ejection fraction ([place])   . Enteritis due to Clostridium difficile   . SBO (small bowel obstruction) ([place]) 08/10/2016  . Collapse of left lung   . S/P percutaneous endoscopic gastrostomy (PEG) tube placement ([place])   . Tracheostomy status ([place])   . HCAP (healthcare-associated pneumonia) 07/16/2016  . Encephalopathy   . Palliative care encounter   . Pressure injury of skin 06/24/2016  . Acute respiratory failure ([place]) 06/21/2016  . Septic shock ([place]) 06/20/2016  . Chronic respiratory failure with hypoxia ([place]) 11/29/2015  . Essential hypertension   . Lactic acidosis 08/02/2015  . History of CVA (cerebrovascular accident) 08/02/2015  . Chronic systolic HF (heart failure) ([place]) 08/02/2015  . Cognitive impairment 08/02/2015  . AKI (acute kidney injury) ([place]) 02/08/2015  . Abnormality of gait 02/08/2015  . Hyperthyroidism 02/08/2015  . DM2 (diabetes mellitus, type 2) ([place]) 02/08/2015  . Obesity hypoventilation syndrome ([place]) 06/20/2013  . Hyperglycemia 06/28/2009  . HYPERLIPIDEMIA-MIXED 06/28/2009  . CAD, NATIVE VESSEL 06/28/2009  . DYSPNEA 06/28/2009    Past Surgical History:  Procedure Laterality Date  . IR GENERIC HISTORICAL  07/12/2016   IR US GUIDE VASC ACCESS RIGHT 07/12/2016 [name], MD [facility]  . IR GENERIC HISTORICAL  07/12/2016   IR FLUORO GUIDE CV LINE RIGHT 07/12/2016 [name], MD [facility]  . IR GENERIC HISTORICAL  07/17/2016   IR GASTROSTOMY TUBE MOD SED 07/17/2016 [name],  MD [facility]  . IR GENERIC HISTORICAL  08/23/2016   IR REMOVAL TUN CV CATH W/O FL 08/23/2016 [name], MD [facility]  . IR GENERIC HISTORICAL  11/09/2016   IR GASTROSTOMY TUBE REMOVAL 11/09/2016 [name], PA-C [facility]  . TRACHEOSTOMY TUBE PLACEMENT N/A 07/14/2016   Procedure: TRACHEOSTOMY, THYROID ISTHMUSECTOMY;  Surgeon: [name], MD;  Location: [facility];  Service:  ENT;  Laterality: N/A;  . VENTRAL HERNIA REPAIR      OB History    No data available       Home Medications    Prior to Admission medications   Medication Sig Start Date End Date Taking? Authorizing Provider  amLODipine (NORVASC) 5 MG tablet Take 1 tablet (5 mg total) by mouth daily. 12/24/16  Yes Johnson, Clanford L, MD  aspirin 81 MG chewable tablet Chew 81 mg by mouth daily.   Yes [provider]  atorvastatin (LIPITOR) 40 MG tablet Take 40 mg by mouth every evening.    Yes [provider]  b complex-vitamin c-folic acid (NEPHRO-VITE) 0.8 MG TABS tablet Take 1 tablet by mouth every morning.   Yes [provider]  bisacodyl (DULCOLAX) 10 MG suppository Place 10 mg rectally daily as needed for moderate constipation.   Yes [provider]  budesonide (PULMICORT) 0.5 MG/2ML nebulizer solution Take 0.5 mg by nebulization 2 (two) times daily.   Yes [provider]  cholecalciferol (VITAMIN D) 1000 units tablet Take 1,000 Units by mouth daily.   Yes [provider]  docusate sodium (COLACE) 100 MG capsule Take 100 mg by mouth daily.   Yes [provider]  famotidine (PEPCID) 20 MG tablet Take 20 mg by mouth daily.   Yes [provider]  ferrous sulfate 325 (65 FE) MG tablet Take 325 mg by mouth 2 (two) times daily with a meal.   Yes [provider]  furosemide (LASIX) 20 MG tablet Take 10 mg by mouth daily. Take 1/2 tablet to = 10 mg by mouth once daily for CHF/Edema 02/05/17  Yes [provider]  insulin detemir (LEVEMIR) 100 UNIT/ML injection Inject 5 Units into the skin at bedtime.   Yes [provider]  Melatonin 3 MG TABS Take 3 mg by mouth at bedtime.    Yes [provider]  memantine (NAMENDA) 10 MG tablet Take 10 mg by mouth 2 (two) times daily.    Yes [provider]  methimazole (TAPAZOLE) 10 MG tablet Take 10 mg by mouth daily.    Yes [provider]    metoprolol (LOPRESSOR) 50 MG tablet Take 50 mg by mouth 2 (two) times daily.   Yes [provider]  trolamine salicylate (ASPERCREME) 10 % cream Apply 1 application topically 2 (two) times daily.    Yes [provider]  acetaminophen (TYLENOL) 500 MG tablet Take 500 mg by mouth every 6 (six) hours as needed for mild pain.     [provider]  ipratropium-albuterol (DUONEB) 0.5-2.5 (3) MG/3ML SOLN Take 3 mLs by nebulization every 4 (four) hours as needed (for dyspnea).    [provider]  magnesium hydroxide (MILK OF MAGNESIA) 400 MG/5ML suspension Take 30 mLs by mouth daily as needed for mild constipation.    [provider]  ondansetron (ZOFRAN) 4 MG tablet Take 4 mg by mouth every 8 (eight) hours as needed for nausea or vomiting.    [provider]  Sodium Phosphates (RA SALINE ENEMA RE) Place 1 Applicatorful rectally  daily as needed (constipation).    [provider]    Family History Family History  Problem Relation Age of Onset  . Diabetes Mother   . Cancer Neg Hx   . Heart disease Neg Hx   . Stroke Neg Hx     Social History Social History  Substance Use Topics  . Smoking status: Former Smoker    Packs/day: 0.25    Years: 30.00    Types: Cigarettes    Quit date: 09/25/2002  . Smokeless tobacco: Never Used  . Alcohol use No     Allergies   Penicillins and Lactose intolerance (gi)   Review of Systems Review of Systems  All other systems reviewed and are negative.  Physical Exam Updated Vital Signs BP (!) 168/73   Pulse 77   Temp 98.4 F (36.9 C) (Oral)   Resp 14   Ht 5\' 6"  (1.676 m)   Wt 99.8 kg (220 lb)   SpO2 100%   BMI 35.51 kg/m   Physical Exam  Constitutional: She appears well-developed and well-nourished.  obese  HENT:  Head: Normocephalic and atraumatic.  Eyes: Conjunctivae are normal. Pupils are equal, round, and reactive to light. Right eye exhibits no discharge. Left eye exhibits no  discharge. No scleral icterus.  Neck: Normal range of motion. No JVD present. No tracheal deviation present. Thyromegaly present.  Pulmonary/Chest: Effort normal and breath sounds normal. No stridor. No respiratory distress. She has no wheezes. She has no rales. She exhibits no tenderness.  Musculoskeletal: She exhibits no edema.  Neurological: She is alert. Coordination normal.  Skin: Skin is warm.  Psychiatric: She has a normal mood and affect. Her behavior is normal. Judgment and thought content normal.  Nursing note and vitals reviewed.   ED Treatments / Results  Labs (all labs ordered are listed, but only abnormal results are displayed) Labs Reviewed  CBC WITH DIFFERENTIAL/PLATELET - Abnormal; Notable for the following:       Result Value   WBC 11.9 (*)    RBC 3.39 (*)    Hemoglobin 9.3 (*)    HCT 29.6 (*)    Neutro Abs 10.1 (*)    All other components within normal limits  BASIC METABOLIC PANEL - Abnormal; Notable for the following:    Glucose, Bld 211 (*)    BUN 36 (*)    Creatinine, Ser 1.58 (*)    GFR calc non Af Amer 34 (*)    GFR calc Af Amer 39 (*)    All other components within normal limits  BRAIN NATRIURETIC PEPTIDE  I-STAT TROPOININ, ED    EKG  EKG Interpretation None       Radiology Dg Chest 2 View  Result Date: 03/20/2017 CLINICAL DATA:  Shortness of breath without chest pain. History of asthma, diabetes, and hypertension. Nonsmoker. EXAM: CHEST  2 VIEW COMPARISON:  Chest x-ray dated Feb 06, 2017 FINDINGS: The lungs are borderline hypoinflated. The interstitial markings are mildly increased. The pulmonary vascularity is engorged. The cardiac silhouette is enlarged. There is no pleural effusion or alveolar infiltrate. The bony thorax exhibits no acute abnormality. There is deviation of the trachea toward the right which is stable. IMPRESSION: CHF with mild pulmonary interstitial edema. No alveolar pneumonia or pleural effusion. Chronic deviation of the  trachea toward the right is more conspicuous than on the studies of earlier this year. This may be secondary to a goiter or to other superior mediastinal mass. Thyroid ultrasound would be a useful next imaging  step. Electronically Signed   By: David  Martinique M.D.   On: 03/20/2017 12:40    Procedures Procedures (including critical care time)  Medications Ordered in ED Medications  ipratropium-albuterol (DUONEB) 0.5-2.5 (3) MG/3ML nebulizer solution 3 mL (3 mLs Nebulization Given 03/20/17 1526)     Initial Impression / Assessment and Plan / ED Course  I have reviewed the triage vital signs and the nursing notes.  Pertinent labs & imaging results that were available during my care of the patient were reviewed by me and considered in my medical decision making (see chart for details).    Final Clinical Impressions(s) / ED Diagnoses    Final diagnoses:  SOB (shortness of breath)   Labs:  I stat trop, cbc, bmp, bnp   Imaging: Dg chest 2 view  Consults:  Therapeutics: Duoneb   Discharge Meds:   Assessment/Plan: 66 year old female presents today with shortness of breath.  She was severely hypoxic down to 58% this morning at nursing facility.  Nebulized treatment increased her O2 to 93% on 4 L.  Initial evaluation showed no significant signs of respiratory distress.  Patient did have some adventitious upper respiratory sounds similar to stridor with agitation.  She was monitored here in the ED, developed worsening stridor while here, oxygen maintained within normal limits.  Patient will be given another breathing treatment.  Patient's plain films show CHF with mild pulmonary interstitial edema no acute infection.  She does have chronic deviation of the trachea which is enlarged compared to her previous studies per radiology.  Patient does have a large goiter that may be causing deviation and mechanical obstruction.  Patient does not appear to be significantly fluid overloaded, she does have  signs of CHF on chest x-ray, normal BNP.  [facility] will be consulted for ongoing management   New Prescriptions New Prescriptions   No medications on file     Francee Gentile 03/20/17 1624    Gareth Morgan, MD 03/21/17 1426    Gareth Morgan, MD 03/21/17 1427

## 2017-03-20 NOTE — H&P (Addendum)
History and Physical    Angelica Beck GGE:[id number] DOB: July 01, 1951 DOA: 03/20/2017  PCP: [name], MD Patient coming from: SNF  Chief Complaint: SOB  HPI: Angelica Beck is a 66 y.o. female with medical history significant of DM, HLD, HTN, hyperthyroidism, CVA, Dementia, OHS.  Low 5 caveat applies his patient has baseline dementia making her history giving unreliable. History obtained mainly from EDP and PCP visit note which were included in patient's chart.  One day history of shortness of breath. Patient was noted to be hypoxic at her nursing home with an O2 saturation of approximately 58%. Patient was given a nebulizer treatment and placed on 4 L nasal cannula. Patient was then transported to her primary care physician for further evaluation. Patient is a resident of [facility]. There are no reported fevers, chest pain, palpitations, LOC, focal neurological deficits, abdominal pain, dysuria, frequency complaints.    ED Course: Objective findings outlined below. Tried hospice consult for admission.  Review of Systems: As per HPI otherwise all other systems reviewed and are negative  Ambulatory Status: Minimally ambulatory secondary to poor respiratory status.  Past Medical History:  Diagnosis Date  . Arthritis   . Asthma    09/07/16  . Diabetes mellitus, type 2 ([place])   . Hyperlipidemia   . Hypertension   . Hyperthyroidism   . Obesity hypoventilation syndrome ([place]) 06/20/2013  . Stroke ([place])   . Vascular dementia     Past Surgical History:  Procedure Laterality Date  . IR GENERIC HISTORICAL  07/12/2016   IR US GUIDE VASC ACCESS RIGHT 07/12/2016 [name], MD [facility]  . IR GENERIC HISTORICAL  07/12/2016   IR FLUORO GUIDE CV LINE RIGHT 07/12/2016 [name], MD [facility]  . IR GENERIC HISTORICAL  07/17/2016   IR GASTROSTOMY TUBE MOD SED 07/17/2016 [name], MD [facility]  . IR GENERIC HISTORICAL  08/23/2016   IR REMOVAL TUN CV  CATH W/O FL 08/23/2016 [name], MD [facility]  . IR GENERIC HISTORICAL  11/09/2016   IR GASTROSTOMY TUBE REMOVAL 11/09/2016 [name], PA-C [facility]  . TRACHEOSTOMY TUBE PLACEMENT N/A 07/14/2016   Procedure: TRACHEOSTOMY, THYROID ISTHMUSECTOMY;  Surgeon: [name], MD;  Location: [facility];  Service: ENT;  Laterality: N/A;  . VENTRAL HERNIA REPAIR      Social History   Social History  . Marital status: Single    Spouse name: N/A  . Number of children: N/A  . Years of education: N/A   Occupational History  . Not on file.   Social History Main Topics  . Smoking status: Former Smoker    Packs/day: 0.25    Years: 30.00    Types: Cigarettes    Quit date: 09/25/2002  . Smokeless tobacco: Never Used  . Alcohol use No  . Drug use: No  . Sexual activity: Not Currently   Other Topics Concern  . Not on file   Social History Narrative  . No narrative on file    Allergies  Allergen Reactions  . Penicillins Swelling    FACIAL SWELLING Has patient had a PCN reaction causing immediate rash, facial/tongue/throat swelling, SOB or lightheadedness with hypotension: Yes Has patient had a PCN reaction causing severe rash involving mucus membranes or skin necrosis: No Has patient had a PCN reaction that required hospitalization: No Has patient had a PCN reaction occurring within the last 10 years: No If all of the above answers are "NO", then may proceed with Cephalosporin use.   Marland Kitchen  Lactose Intolerance (Gi) Diarrhea and Nausea And Vomiting    Family History  Problem Relation Age of Onset  . Diabetes Mother   . Cancer Neg Hx   . Heart disease Neg Hx   . Stroke Neg Hx       Prior to Admission medications   Medication Sig Start Date End Date Taking? Authorizing Provider  amLODipine (NORVASC) 5 MG tablet Take 1 tablet (5 mg total) by mouth daily. 12/24/16  Yes Johnson, Clanford L, MD  aspirin 81 MG chewable tablet Chew 81 mg by mouth daily.   Yes [provider]  atorvastatin (LIPITOR) 40 MG tablet Take 40 mg by mouth every evening.    Yes [provider]  b complex-vitamin c-folic acid (NEPHRO-VITE) 0.8 MG TABS tablet Take 1 tablet by mouth every morning.   Yes [provider]  bisacodyl (DULCOLAX) 10 MG suppository Place 10 mg rectally daily as needed for moderate constipation.   Yes [provider]  budesonide (PULMICORT) 0.5 MG/2ML nebulizer solution Take 0.5 mg by nebulization 2 (two) times daily.   Yes [provider]  cholecalciferol (VITAMIN D) 1000 units tablet Take 1,000 Units by mouth daily.   Yes [provider]  docusate sodium (COLACE) 100 MG capsule Take 100 mg by mouth daily.   Yes [provider]  famotidine (PEPCID) 20 MG tablet Take 20 mg by mouth daily.   Yes [provider]  ferrous sulfate 325 (65 FE) MG tablet Take 325 mg by mouth 2 (two) times daily with a meal.   Yes [provider]  furosemide (LASIX) 20 MG tablet Take 10 mg by mouth daily. Take 1/2 tablet to = 10 mg by mouth once daily for CHF/Edema 02/05/17  Yes [provider]  insulin detemir (LEVEMIR) 100 UNIT/ML injection Inject 5 Units into the skin at bedtime.   Yes [provider]  Melatonin 3 MG TABS Take 3 mg by mouth at bedtime.    Yes [provider]  memantine (NAMENDA) 10 MG tablet Take 10 mg by mouth 2 (two) times daily.    Yes [provider]  methimazole (TAPAZOLE) 10 MG tablet Take 10 mg by mouth daily.    Yes [provider]  metoprolol (LOPRESSOR) 50 MG tablet Take 50 mg by mouth 2 (two) times daily.   Yes [provider]  trolamine salicylate (ASPERCREME) 10 % cream Apply 1 application topically 2 (two) times daily.    Yes [provider]  acetaminophen (TYLENOL) 500 MG tablet Take 500 mg by mouth every 6 (six) hours as needed for mild pain.     [provider]  ipratropium-albuterol (DUONEB) 0.5-2.5 (3) MG/3ML SOLN  Take 3 mLs by nebulization every 4 (four) hours as needed (for dyspnea).    [provider]  magnesium hydroxide (MILK OF MAGNESIA) 400 MG/5ML suspension Take 30 mLs by mouth daily as needed for mild constipation.    [provider]  ondansetron (ZOFRAN) 4 MG tablet Take 4 mg by mouth every 8 (eight) hours as needed for nausea or vomiting.    [provider]  Sodium Phosphates (RA SALINE ENEMA RE) Place 1 Applicatorful rectally daily as needed (constipation).    [provider]    Physical Exam: Vitals:   03/20/17 1315 03/20/17 1445 03/20/17 1500 03/20/17 1715  BP: (!) 131/51 (!) 153/75 (!) 168/73 111/85  Pulse: 99 80 77 72  Resp: 19 15 14  (!) 23  Temp:  TempSrc:      SpO2: 100% 100% 100% 100%  Weight:      Height:         General: Appears mildly anxious, sitting up in bed. Eyes:  PERRL, EOMI, normal lids, iris ENT:  grossly normal hearing, lips & tongue, mmm Neck: Markedly enlarged greater with greater prominence on the left than on the right, previous tracheostomy scar noted. Cardiovascular:  RRR, no m/r/g. No LE edema.  Respiratory: Marked stridor appreciated especially with any effort. Diminished breath sounds throughout which may be secondary to body habitus. Increased effort. On 4 L nasal cannula. Abdomen:  soft, ntnd, NABS Skin:  no rash or induration seen on limited exam Musculoskeletal:  grossly normal tone BUE/BLE, good ROM, no bony abnormality Psychiatric: Somewhat flattened affect. Answers questions appropriately though unclear if the information given is actually accurate. Patient not entirely aware of why she is in the hospital. Neurologic:  CN 2-12 grossly intact, moves all extremities in coordinated fashion, sensation intact  Labs on Admission: I have personally reviewed following labs and imaging studies  CBC:  Recent Labs Lab 03/20/17 03/20/17 1416  WBC 8.3 11.9*  NEUTROABS 6 10.1*  HGB 9.1* 9.3*  HCT 30* 29.6*  MCV   --  87.3  PLT 209 948   Basic Metabolic Panel:  Recent Labs Lab 03/20/17 1416  NA 138  K 4.5  CL 105  CO2 24  GLUCOSE 211*  BUN 36*  CREATININE 1.58*  CALCIUM 9.3   GFR: Estimated Creatinine Clearance: 42.9 mL/min (A) (by C-G formula based on SCr of 1.58 mg/dL (H)). Liver Function Tests: No results for input(s): AST, ALT, ALKPHOS, BILITOT, PROT, ALBUMIN in the last 168 hours. No results for input(s): LIPASE, AMYLASE in the last 168 hours. No results for input(s): AMMONIA in the last 168 hours. Coagulation Profile: No results for input(s): INR, PROTIME in the last 168 hours. Cardiac Enzymes: No results for input(s): CKTOTAL, CKMB, CKMBINDEX, TROPONINI in the last 168 hours. BNP (last 3 results) No results for input(s): PROBNP in the last 8760 hours. HbA1C: No results for input(s): HGBA1C in the last 72 hours. CBG: No results for input(s): GLUCAP in the last 168 hours. Lipid Profile: No results for input(s): CHOL, HDL, LDLCALC, TRIG, CHOLHDL, LDLDIRECT in the last 72 hours. Thyroid Function Tests: No results for input(s): TSH, T4TOTAL, FREET4, T3FREE, THYROIDAB in the last 72 hours. Anemia Panel: No results for input(s): VITAMINB12, FOLATE, FERRITIN, TIBC, IRON, RETICCTPCT in the last 72 hours. Urine analysis:    Component Value Date/Time   COLORURINE STRAW (A) 12/22/2016 1529   APPEARANCEUR HAZY (A) 12/22/2016 1529   LABSPEC 1.011 12/22/2016 1529   PHURINE 5.0 12/22/2016 1529   GLUCOSEU NEGATIVE 12/22/2016 1529   HGBUR SMALL (A) 12/22/2016 1529   BILIRUBINUR NEGATIVE 12/22/2016 1529   KETONESUR NEGATIVE 12/22/2016 1529   PROTEINUR NEGATIVE 12/22/2016 1529   UROBILINOGEN 0.2 08/02/2015 0935   NITRITE NEGATIVE 12/22/2016 1529   LEUKOCYTESUR SMALL (A) 12/22/2016 1529    Creatinine Clearance: Estimated Creatinine Clearance: 42.9 mL/min (A) (by C-G formula based on SCr of 1.58 mg/dL (H)).  Sepsis Labs: @LABRCNTIP (procalcitonin:4,lacticidven:4) )No results found  for this or any previous visit (from the past 240 hour(s)).   Radiological Exams on Admission: Dg Chest 2 View  Result Date: 03/20/2017 CLINICAL DATA:  Shortness of breath without chest pain. History of asthma, diabetes, and hypertension. Nonsmoker. EXAM: CHEST  2 VIEW COMPARISON:  Chest x-ray dated Feb 06, 2017 FINDINGS: The lungs are borderline  hypoinflated. The interstitial markings are mildly increased. The pulmonary vascularity is engorged. The cardiac silhouette is enlarged. There is no pleural effusion or alveolar infiltrate. The bony thorax exhibits no acute abnormality. There is deviation of the trachea toward the right which is stable. IMPRESSION: CHF with mild pulmonary interstitial edema. No alveolar pneumonia or pleural effusion. Chronic deviation of the trachea toward the right is more conspicuous than on the studies of earlier this year. This may be secondary to a goiter or to other superior mediastinal mass. Thyroid ultrasound would be a useful next imaging step. Electronically Signed   By: David  Martinique M.D.   On: 03/20/2017 12:40    EKG: Independently reviewed. Sinus, no acs  Assessment/Plan Active Problems:   Obesity hypoventilation syndrome (HCC)   Hyperthyroidism   History of CVA (cerebrovascular accident)   Essential hypertension   Acute on chronic respiratory failure ([facility])   Diastolic dysfunction   CKD (chronic kidney disease) stage 3, GFR 30-59 ml/min   Dementia   Acute on chronic respiratory failure: likely multifactorial. Patient with a complex respiratory history including obesity hypoventilation syndrome, mechanical obstruction from very large goiter causing tracheal deviation, and less likely infectious etiology. Of note pt w/ temporary tracheostomy in 2017 due to failure to extubate due to thyroid goiter. Mild concurrent CHF exacerbation. Doubt infectious etiology.  - Decadron 10mg  IV x1 - Pulmonology consulted for assistance w/ management and decision making -  thyroidectomy, permanent trach, other solutions, deescalate care (DNR status. Family currently adament pt is a full code) - O2 and Neb PRN - Consider Thyroidectomy due to recurrent respiratory failure. Discussion may be had in the outpt setting - Continue Pulmicort  - Lasix 40mg  IV x1 - Procalcitonin  Acute on chronic diastolic CHF: Last echo showing an EF of 68% grade 1 diastolic dysfunction. BNP 70.8 which is in the normal range. Chest x-ray showing mild pulmonary interstitial edema. Doubt that this is a significant flare with regards to patient's overall respiratory status but due to other decompensated physiology's treatment is recommended. - Lasix 40 mg IV 1 - Resume on 03/21/2017 - Strict I's and O's, daily weights  CKD: Cr 1.58. At baseline. - BMP in am  DM: - continue levemir - SSI  HLD: - continue statin  HTN: - continue norvasc, metop  Constipation: chronic and at baseline - continue mild of mag.  Hyperthyroid: Markedly enlarged goiter. Chronic - continue tapazole  Dementia: - continue Namenda  GERD; - continue pepcid  Insomnia: - continue melatonin    DVT prophylaxis: Hep  Code Status: Full  Family Communication: none  Disposition Plan: pending improvement in respiratory status and decisions regarding long term care  Consults called: Pulm  Admission status: inpt    MERRELL, DAVID J MD [facility]  If 7PM-7AM, please contact night-coverage [email or web address] Password [facility]  03/20/2017, 5:51 PM

## 2017-03-20 NOTE — ED Notes (Signed)
Patient transported to X-ray 

## 2017-03-20 NOTE — ED Triage Notes (Signed)
Pt brought in by EMS due to having SOB this morning from [place]. Pt was given 5mg  of albuterol enroute. Pt has hx of dementia.

## 2017-03-21 ENCOUNTER — Inpatient Hospital Stay (HOSPITAL_COMMUNITY): Payer: Medicare Other

## 2017-03-21 DIAGNOSIS — J9621 Acute and chronic respiratory failure with hypoxia: Secondary | ICD-10-CM

## 2017-03-21 DIAGNOSIS — N179 Acute kidney failure, unspecified: Secondary | ICD-10-CM

## 2017-03-21 DIAGNOSIS — N183 Chronic kidney disease, stage 3 (moderate): Secondary | ICD-10-CM

## 2017-03-21 DIAGNOSIS — I5033 Acute on chronic diastolic (congestive) heart failure: Secondary | ICD-10-CM

## 2017-03-21 LAB — BASIC METABOLIC PANEL
ANION GAP: 9 (ref 5–15)
BUN: 41 mg/dL — AB (ref 6–20)
CHLORIDE: 104 mmol/L (ref 101–111)
CO2: 25 mmol/L (ref 22–32)
Calcium: 9.6 mg/dL (ref 8.9–10.3)
Creatinine, Ser: 1.78 mg/dL — ABNORMAL HIGH (ref 0.44–1.00)
GFR calc non Af Amer: 29 mL/min — ABNORMAL LOW (ref >60)
GFR, EST AFRICAN AMERICAN: 34 mL/min — AB (ref >60)
Glucose, Bld: 151 mg/dL — ABNORMAL HIGH (ref 65–99)
POTASSIUM: 5.3 mmol/L — AB (ref 3.5–5.1)
SODIUM: 138 mmol/L (ref 135–145)

## 2017-03-21 LAB — GLUCOSE, CAPILLARY
GLUCOSE-CAPILLARY: 250 mg/dL — AB (ref 65–99)
GLUCOSE-CAPILLARY: 151 mg/dL — AB (ref 65–99)
GLUCOSE-CAPILLARY: 112 mg/dL — AB (ref 65–99)
Glucose-Capillary: 134 mg/dL — ABNORMAL HIGH (ref 65–99)

## 2017-03-21 LAB — CBC
HCT: 29.1 % — ABNORMAL LOW (ref 36.0–46.0)
HEMOGLOBIN: 8.9 g/dL — AB (ref 12.0–15.0)
MCH: 26.6 pg (ref 26.0–34.0)
MCHC: 30.6 g/dL (ref 30.0–36.0)
MCV: 87.1 fL (ref 78.0–100.0)
Platelets: 217 10*3/uL (ref 150–400)
RBC: 3.34 MIL/uL — AB (ref 3.87–5.11)
RDW: 14.1 % (ref 11.5–15.5)
WBC: 7.9 10*3/uL (ref 4.0–10.5)

## 2017-03-21 MED ORDER — FUROSEMIDE 10 MG/ML IJ SOLN
40.0000 mg | Freq: Two times a day (BID) | INTRAMUSCULAR | Status: DC
  Administered 2017-03-21 – 2017-03-23 (×4): 40 mg via INTRAVENOUS
  Filled 2017-03-21 (×4): qty 4

## 2017-03-21 NOTE — Progress Notes (Addendum)
PROGRESS NOTE                                                                                                                                                                                                             Patient Demographics:    Angelica Beck, is a 66 y.o. female, DOB - [birth year], VOH:[id number]  Admit date - 03/20/2017   Admitting Physician Waldemar Dickens, MD  Outpatient Primary MD for the patient is [name], MD  LOS - 1  Outpatient Specialists: Pulmonary ENT  Chief Complaint  Patient presents with  . Shortness of Breath       Brief Narrative   66 year old female with history of ?OHS, vascular dementia, stroke, diabetes mellitus type 2, hypertension, hyperthyroidism, hospitalized in 06/2016 reason respiratory failure requiring tracheostomy, isthmectomy for large goiter status post trach which was decannulated in 09/2016. She is currently on 2 L oxygen at night. Patient presented from SNF with shortness of breath and hypoxia with stridor.  chest x-ray showing pulmonary edema. Patient admitted for further management. PC CM consulted.     Subjective:     Patient appears stable on 2L via nasal cannula. Poorly communicative.   Assessment  & Plan :   Principal problem Acute on chronic hypoxic respiratory failure Appears to be due to acute pulmonary edema with diastolic dysfunction. Continue O2 via nasal cannula. Placed on IV Lasix 40 mg twice a day. Monitor strict I/O and daily weight. Pulmonary consult appreciated. Suggest this is likely due to volume overload.. No need for steroids. Repeat tracheostomy or thyroidectomy not needed. Have ordered CT of the neck to evaluate the goiter and any pressure on the airway or tracheal stenosis from tracheostomy.   Stridor On admission which was not evident on exam today. Appears to be positional.  Acute on chronic diastolic CHF Last echo with  EF of 60% and grade 1 diastolic dysfunction. BNP normal. Received one dose IV Lasix on admission. Oxygenation much improved but still has significant edema. Will place her on 40 mg IV Lasix twice a day. Strict I/O and  daily weight.  Acute on Chronic kidney disease stage 2-3 Mildly worsened renal function today. Monitor with diuresis.  Diabetes mellitus type 2 Continue Levemir and sliding scale coverage.  Hypertension Stable. Continue amlodipine and metoprolol.  Hyperlipidemia Continue Synthroid  Hyperthyroidism with  goiter Continue Tapazole.  Vascular dementia Continue Namenda     Code Status : Full code  Family Communication  : daughter updated on the phone  Disposition Plan  : Return to SNF in 1-2 days if respiratory function improves  Barriers For Discharge : Active symptoms  Consults  :  PC CM  Procedures  :  CT of the neck  DVT Prophylaxis  :  Lovenox -   Lab Results  Component Value Date   PLT 217 03/21/2017    Antibiotics  :    Anti-infectives    None        Objective:   Vitals:   03/21/17 0651 03/21/17 0812 03/21/17 0904 03/21/17 1148  BP:  136/71  (!) 173/57  Pulse:  (!) 59  72  Resp:    20  Temp:  98.5 F (36.9 C)  98.5 F (36.9 C)  TempSrc:  Oral  Oral  SpO2: 100% 100% 100% 100%  Weight:      Height:        Wt Readings from Last 3 Encounters:  03/21/17 99 kg (218 lb 4.8 oz)  03/20/17 100 kg (220 lb 7.4 oz)  03/09/17 100 kg (220 lb 6.4 oz)     Intake/Output Summary (Last 24 hours) at 03/21/17 1411 Last data filed at 03/21/17 0005  Gross per 24 hour  Intake              240 ml  Output              750 ml  Net             -510 ml     Physical Exam Gen.: Elderly female not in distress, poorly communicative HEENT:, No JVD, enlarged goiter, prior tracheostomy scar Chest: Few scattered rhonchi, no crackles, or stridor CVS: Normal S1 and S2, no murmurs GI: Soft, nondistended, nontender tender Muscular scheduled to 1+ pitting  edema bilaterally     Data Review:    CBC  Recent Labs Lab 03/20/17 03/20/17 1416 03/21/17 0437  WBC 8.3 11.9* 7.9  HGB 9.1* 9.3* 8.9*  HCT 30* 29.6* 29.1*  PLT 209 216 217  MCV  --  87.3 87.1  MCH  --  27.4 26.6  MCHC  --  31.4 30.6  RDW  --  14.3 14.1  LYMPHSABS  --  1.1  --   MONOABS  --  0.7  --   EOSABS  --  0.1  --   BASOSABS  --  0.0  --     Chemistries   Recent Labs Lab 03/20/17 1416 03/21/17 0437  NA 138 138  K 4.5 5.3*  CL 105 104  CO2 24 25  GLUCOSE 211* 151*  BUN 36* 41*  CREATININE 1.58* 1.78*  CALCIUM 9.3 9.6   ------------------------------------------------------------------------------------------------------------------ No results for input(s): CHOL, HDL, LDLCALC, TRIG, CHOLHDL, LDLDIRECT in the last 72 hours.  Lab Results  Component Value Date   HGBA1C 7.4 03/10/2017   ------------------------------------------------------------------------------------------------------------------ No results for input(s): TSH, T4TOTAL, T3FREE, THYROIDAB in the last 72 hours.  Invalid input(s): FREET3 ------------------------------------------------------------------------------------------------------------------ No results for input(s): VITAMINB12, FOLATE, FERRITIN, TIBC, IRON, RETICCTPCT in the last 72 hours.  Coagulation profile No results for input(s): INR, PROTIME in the last 168 hours.  No results for input(s): DDIMER in the last 72 hours.  Cardiac Enzymes No results for input(s): CKMB, TROPONINI, MYOGLOBIN in the last 168 hours.  Invalid input(s): CK ------------------------------------------------------------------------------------------------------------------    Component Value Date/Time  BNP 70.8 03/20/2017 1416    Inpatient Medications  Scheduled Meds: . amLODipine  5 mg Oral Daily  . aspirin  81 mg Oral Daily  . atorvastatin  40 mg Oral QPM  . budesonide  0.5 mg Nebulization BID  . docusate sodium  100 mg Oral Daily  .  famotidine  20 mg Oral Daily  . ferrous sulfate  325 mg Oral BID WC  . furosemide  10 mg Oral Daily  . heparin  5,000 Units Subcutaneous Q8H  . insulin aspart  0-5 Units Subcutaneous QHS  . insulin aspart  0-9 Units Subcutaneous TID WC  . insulin detemir  5 Units Subcutaneous QHS  . Melatonin  3 mg Oral QHS  . memantine  10 mg Oral BID  . methimazole  10 mg Oral Daily  . metoprolol tartrate  50 mg Oral BID  . multivitamin  1 tablet Oral QHS   Continuous Infusions: PRN Meds:.acetaminophen **OR** acetaminophen, ipratropium-albuterol, magnesium hydroxide, ondansetron, promethazine  Micro Results Recent Results (from the past 240 hour(s))  MRSA PCR Screening     Status: None   Collection Time: 03/20/17  6:48 PM  Result Value Ref Range Status   MRSA by PCR NEGATIVE NEGATIVE Final    Comment:        The GeneXpert MRSA Assay (FDA approved for NASAL specimens only), is one component of a comprehensive MRSA colonization surveillance program. It is not intended to diagnose MRSA infection nor to guide or monitor treatment for MRSA infections.     Radiology Reports Dg Chest 2 View  Result Date: 03/20/2017 CLINICAL DATA:  Shortness of breath without chest pain. History of asthma, diabetes, and hypertension. Nonsmoker. EXAM: CHEST  2 VIEW COMPARISON:  Chest x-ray dated Feb 06, 2017 FINDINGS: The lungs are borderline hypoinflated. The interstitial markings are mildly increased. The pulmonary vascularity is engorged. The cardiac silhouette is enlarged. There is no pleural effusion or alveolar infiltrate. The bony thorax exhibits no acute abnormality. There is deviation of the trachea toward the right which is stable. IMPRESSION: CHF with mild pulmonary interstitial edema. No alveolar pneumonia or pleural effusion. Chronic deviation of the trachea toward the right is more conspicuous than on the studies of earlier this year. This may be secondary to a goiter or to other superior mediastinal  mass. Thyroid ultrasound would be a useful next imaging step. Electronically Signed   By: David  Martinique M.D.   On: 03/20/2017 12:40    Time Spent in minutes  25   Louellen Molder M.D on 03/21/2017 at 2:11 PM  Between 7am to 7pm - Pager - [phone]  After 7pm go to [email or web address] - password [facility]  [facility] -  Office  [phone]

## 2017-03-21 NOTE — Clinical Social Work Note (Signed)
Patient is a long-term resident at [facility]. CSW left voicemail for patient's daughter. Will discuss return when she returns call.  Dayton Scrape, [place]

## 2017-03-21 NOTE — Progress Notes (Addendum)
Attempted to order a continuous pulse oximetry box from portable equipment department. This RN changed patient's telemetry box to one which supports continuous SPO2 monitoring. Instructed CCMD to notify RN when O2 drops below 92%. Humidifier added to nasal cannula. PRN Duoneb administered last night for dyspnea. Will continue to monitor.  PureWick external catheter applied to patient last evening shortly after shift change. Patient becomes very dyspneic when staff lays bed down to perform pericare in this incontinent patient.

## 2017-03-21 NOTE — Progress Notes (Signed)
Patient resides in [facility] and plans to return to SNF at discharge; Judson Roch SW following for DCPAneta Beck [phone]

## 2017-03-22 DIAGNOSIS — J9621 Acute and chronic respiratory failure with hypoxia: Secondary | ICD-10-CM

## 2017-03-22 DIAGNOSIS — E040 Nontoxic diffuse goiter: Secondary | ICD-10-CM

## 2017-03-22 LAB — GLUCOSE, CAPILLARY
GLUCOSE-CAPILLARY: 230 mg/dL — AB (ref 65–99)
GLUCOSE-CAPILLARY: 206 mg/dL — AB (ref 65–99)
Glucose-Capillary: 251 mg/dL — ABNORMAL HIGH (ref 65–99)
Glucose-Capillary: 139 mg/dL — ABNORMAL HIGH (ref 65–99)

## 2017-03-22 LAB — BASIC METABOLIC PANEL
Anion gap: 8 (ref 5–15)
BUN: 45 mg/dL — AB (ref 6–20)
CHLORIDE: 101 mmol/L (ref 101–111)
CO2: 26 mmol/L (ref 22–32)
CREATININE: 1.71 mg/dL — AB (ref 0.44–1.00)
Calcium: 9.3 mg/dL (ref 8.9–10.3)
GFR calc Af Amer: 35 mL/min — ABNORMAL LOW (ref >60)
GFR calc non Af Amer: 30 mL/min — ABNORMAL LOW (ref >60)
Glucose, Bld: 215 mg/dL — ABNORMAL HIGH (ref 65–99)
Potassium: 4.4 mmol/L (ref 3.5–5.1)
SODIUM: 135 mmol/L (ref 135–145)

## 2017-03-22 NOTE — Progress Notes (Signed)
PROGRESS NOTE                                                                                                                                                                                                             Patient Demographics:    Angelica Beck, is a 66 y.o. female, DOB - 12-12-1950, OJJ:[id number]  Admit date - 03/20/2017   Admitting Physician Waldemar Dickens, MD  Outpatient Primary MD for the patient is [name], MD  LOS - 2  Outpatient Specialists: Pulmonary ENT  Chief Complaint  Patient presents with  . Shortness of Breath       Brief Narrative   66 year old female with history of ?OHS, vascular dementia, stroke, diabetes mellitus type 2, hypertension, hyperthyroidism, hospitalized in 06/2016 reason respiratory failure requiring tracheostomy, isthmectomy for large goiter status post trach which was decannulated in 09/2016. She is currently on 2 L oxygen at night. Patient presented from SNF with shortness of breath and hypoxia with stridor.  chest x-ray showing pulmonary edema. Patient admitted for further management. PC CM consulted.     Subjective:     Reading much better but does have positional stridor.   Assessment  & Plan :   Principal problem Acute on chronic hypoxic respiratory failure Secondary to acute pulmonary edema with diastolic dysfunction. Continue IV Lasix 40 mg twice a day. strict I/O and daily weight. Pulmonary consult appreciated. CT of the neck shows massive goiter with mild mass effect on the subglottic airway with some narrowing. The stridor is likely because of this however patient is not in respiratory distress or gets hypoxic. I will discuss with ENT and patient will benefit from outpatient follow-up (does not need urgent surgical evaluation).   Stridor As above. Appears to be positional. Note of a steroids.  Acute on chronic diastolic CHF Last echo with  EF of 60% and grade 1 diastolic dysfunction. BNP normal. IV Lasix twice a day. Oxygenation has improved.  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 Monitor with diuresis.  Diabetes mellitus type 2 Continue Levemir and sliding scale coverage.  Hypertension Stable. Continue amlodipine and metoprolol.  Hyperlipidemia Continue Synthroid  Hyperthyroidism with goiter Continue Tapazole.  Vascular dementia Continue Namenda     Code Status : Full code  Family Communication  : daughter updated on the  phone on 6/27  Disposition Plan  : Return to SNF tomorrow if bleeding continues to improve and renal function stable.  Barriers For Discharge : Active symptoms  Consults  :  PC CM  Procedures  :  CT of the neck  DVT Prophylaxis  :  Lovenox -   Lab Results  Component Value Date   PLT 217 03/21/2017    Antibiotics  :    Anti-infectives    None        Objective:   Vitals:   03/21/17 2043 03/21/17 2347 03/22/17 0454 03/22/17 0754  BP:  (!) 163/56 (!) 165/59   Pulse:  84 65   Resp:   20   Temp:   97.6 F (36.4 C)   TempSrc:   Oral   SpO2: 100% 100% 100% 98%  Weight:   99.4 kg (219 lb 1.6 oz)   Height:        Wt Readings from Last 3 Encounters:  03/22/17 99.4 kg (219 lb 1.6 oz)  03/20/17 100 kg (220 lb 7.4 oz)  03/09/17 100 kg (220 lb 6.4 oz)     Intake/Output Summary (Last 24 hours) at 03/22/17 1102 Last data filed at 03/22/17 0857  Gross per 24 hour  Intake             1010 ml  Output              885 ml  Net              125 ml     Physical Exam Gen.: Elderly female not in distress, HEENT:, No JVD, enlarged goiter, prior tracheostomy scar Chest: Scattered rhonchi, has positional stridor CVS: Normal S1 and S2, no murmurs GI: Soft, nondistended, nontender tender Musculoskeletal: 1+ pitting edema bilaterally     Data Review:    CBC  Recent Labs Lab 03/20/17 03/20/17 1416 03/21/17 0437  WBC 8.3 11.9* 7.9  HGB  9.1* 9.3* 8.9*  HCT 30* 29.6* 29.1*  PLT 209 216 217  MCV  --  87.3 87.1  MCH  --  27.4 26.6  MCHC  --  31.4 30.6  RDW  --  14.3 14.1  LYMPHSABS  --  1.1  --   MONOABS  --  0.7  --   EOSABS  --  0.1  --   BASOSABS  --  0.0  --     Chemistries   Recent Labs Lab 03/20/17 1416 03/21/17 0437 03/22/17 0737  NA 138 138 135  K 4.5 5.3* 4.4  CL 105 104 101  CO2 24 25 26   GLUCOSE 211* 151* 215*  BUN 36* 41* 45*  CREATININE 1.58* 1.78* 1.71*  CALCIUM 9.3 9.6 9.3   ------------------------------------------------------------------------------------------------------------------ No results for input(s): CHOL, HDL, LDLCALC, TRIG, CHOLHDL, LDLDIRECT in the last 72 hours.  Lab Results  Component Value Date   HGBA1C 7.4 03/10/2017   ------------------------------------------------------------------------------------------------------------------ No results for input(s): TSH, T4TOTAL, T3FREE, THYROIDAB in the last 72 hours.  Invalid input(s): FREET3 ------------------------------------------------------------------------------------------------------------------ No results for input(s): VITAMINB12, FOLATE, FERRITIN, TIBC, IRON, RETICCTPCT in the last 72 hours.  Coagulation profile No results for input(s): INR, PROTIME in the last 168 hours.  No results for input(s): DDIMER in the last 72 hours.  Cardiac Enzymes No results for input(s): CKMB, TROPONINI, MYOGLOBIN in the last 168 hours.  Invalid input(s): CK ------------------------------------------------------------------------------------------------------------------    Component Value Date/Time   BNP 70.8 03/20/2017 1416    Inpatient Medications  Scheduled Meds: . amLODipine  5 mg Oral Daily  . aspirin  81 mg Oral Daily  . atorvastatin  40 mg Oral QPM  . budesonide  0.5 mg Nebulization BID  . docusate sodium  100 mg Oral Daily  . famotidine  20 mg Oral Daily  . ferrous sulfate  325 mg Oral BID WC  . furosemide   40 mg Intravenous Q12H  . heparin  5,000 Units Subcutaneous Q8H  . insulin aspart  0-5 Units Subcutaneous QHS  . insulin aspart  0-9 Units Subcutaneous TID WC  . insulin detemir  5 Units Subcutaneous QHS  . Melatonin  3 mg Oral QHS  . memantine  10 mg Oral BID  . methimazole  10 mg Oral Daily  . metoprolol tartrate  50 mg Oral BID  . multivitamin  1 tablet Oral QHS   Continuous Infusions: PRN Meds:.acetaminophen **OR** acetaminophen, ipratropium-albuterol, magnesium hydroxide, ondansetron, promethazine  Micro Results Recent Results (from the past 240 hour(s))  MRSA PCR Screening     Status: None   Collection Time: 03/20/17  6:48 PM  Result Value Ref Range Status   MRSA by PCR NEGATIVE NEGATIVE Final    Comment:        The GeneXpert MRSA Assay (FDA approved for NASAL specimens only), is one component of a comprehensive MRSA colonization surveillance program. It is not intended to diagnose MRSA infection nor to guide or monitor treatment for MRSA infections.     Radiology Reports Dg Chest 2 View  Result Date: 03/20/2017 CLINICAL DATA:  Shortness of breath without chest pain. History of asthma, diabetes, and hypertension. Nonsmoker. EXAM: CHEST  2 VIEW COMPARISON:  Chest x-ray dated Feb 06, 2017 FINDINGS: The lungs are borderline hypoinflated. The interstitial markings are mildly increased. The pulmonary vascularity is engorged. The cardiac silhouette is enlarged. There is no pleural effusion or alveolar infiltrate. The bony thorax exhibits no acute abnormality. There is deviation of the trachea toward the right which is stable. IMPRESSION: CHF with mild pulmonary interstitial edema. No alveolar pneumonia or pleural effusion. Chronic deviation of the trachea toward the right is more conspicuous than on the studies of earlier this year. This may be secondary to a goiter or to other superior mediastinal mass. Thyroid ultrasound would be a useful next imaging step. Electronically Signed    By: David  Martinique M.D.   On: 03/20/2017 12:40   Ct Soft Tissue Neck Wo Contrast  Result Date: 03/21/2017 CLINICAL DATA:  66 y/o  F; question of goiter. EXAM: CT NECK WITHOUT CONTRAST TECHNIQUE: Multidetector CT imaging of the neck was performed following the standard protocol without intravenous contrast. COMPARISON:  None. FINDINGS: Pharynx and larynx: Normal. No mass or swelling. Salivary glands: No inflammation, mass, or stone. Thyroid: Massive thyroid goiter with the right lobe of thyroid measuring 4.8 x 5.0 x 7.6 cm (AP x ML x CC series 3: Image 65 and 6:60) and the left lobe of thyroid measuring 4.3 x 5.5 x 8.3 cm. There are numerous coarse calcifications throughout the thyroid gland. The thyroid gland exerts mild mass effect on the subglottic airway which is narrowed to 1.7 x 1.0 cm (AP by ML 3:66). Lymph nodes: None enlarged or abnormal density. Vascular: Negative. Limited intracranial: Negative. Visualized orbits: Negative. Mastoids and visualized paranasal sinuses: Clear. Skeleton: Moderate cervical spondylosis with predominantly discogenic degenerative changes greatest at the C3 through C6 levels. No high-grade bony canal stenosis. Upper chest: Negative. Other: Motion artifact at the level of the larynx. IMPRESSION: 1. Massive thyroid goiter. Mild  mass effect on the subglottic airway which is narrowed to 1.7 x 1.0 cm. 2. Moderate cervical spondylosis. Electronically Signed   By: Kristine Garbe M.D.   On: 03/21/2017 22:18    Time Spent in minutes  25   Louellen Molder M.D on 03/22/2017 at 11:02 AM  Between 7am to 7pm - Pager - [phone]  After 7pm go to [email or web address] - password [facility]  [facility] -  Office  [phone]

## 2017-03-22 NOTE — Progress Notes (Signed)
1139 - notified by CCMD that pt had 2.58 second pause.  Pt is resting comfortably in bed, and is asymptomatic.  MD has been notified.

## 2017-03-22 NOTE — Consult Note (Signed)
Reason for Consult: Stridor Referring Physician: Hospitalist  Angelica Beck is an 66 y.o. female.  HPI: 66 year old female nursing home resident admitted yesterday due to hypoxia and shortness of breath.  She has been diuresed and oxygen requirement has improved.  She is noted to have inspiratory stridor in certain positions and a CT scan demonstrates a large thyroid goiter with some narrowing of the upper trachea.  I performed tracheostomy in October and had to perform and isthmusectomy at the time.  She was decannulated in January.  The patient is not a good historian.  I spoke with her daughter, [name], who said that she has been making inspiratory stridor noise since decannulation.  Past Medical History:  Diagnosis Date  . Arthritis   . Asthma    09/07/16  . Diabetes mellitus, type 2 ([place])   . Hyperlipidemia   . Hypertension   . Hyperthyroidism   . Obesity hypoventilation syndrome ([place]) 06/20/2013  . Stroke ([place])   . Vascular dementia     Past Surgical History:  Procedure Laterality Date  . IR GENERIC HISTORICAL  07/12/2016   IR US GUIDE VASC ACCESS RIGHT 07/12/2016 [name], MD [facility]  . IR GENERIC HISTORICAL  07/12/2016   IR FLUORO GUIDE CV LINE RIGHT 07/12/2016 [name], MD [facility]  . IR GENERIC HISTORICAL  07/17/2016   IR GASTROSTOMY TUBE MOD SED 07/17/2016 [name], MD [facility]  . IR GENERIC HISTORICAL  08/23/2016   IR REMOVAL TUN CV CATH W/O FL 08/23/2016 [name], MD [facility]  . IR GENERIC HISTORICAL  11/09/2016   IR GASTROSTOMY TUBE REMOVAL 11/09/2016 [name], PA-C [facility]  . TRACHEOSTOMY TUBE PLACEMENT N/A 07/14/2016   Procedure: TRACHEOSTOMY, THYROID ISTHMUSECTOMY;  Surgeon: [name], MD;  Location: [facility] OR;  Service: ENT;  Laterality: N/A;  . VENTRAL HERNIA REPAIR      Family History  Problem Relation Age of Onset  . Diabetes Mother   . Cancer Neg Hx   . Heart disease Neg Hx   . Stroke Neg Hx      Social History:  reports that she quit smoking about 14 years ago. Her smoking use included Cigarettes. She has a 7.50 pack-year smoking history. She has never used smokeless tobacco. She reports that she does not drink alcohol or use drugs.  Allergies:  Allergies  Allergen Reactions  . Penicillins Swelling    FACIAL SWELLING Has patient had a PCN reaction causing immediate rash, facial/tongue/throat swelling, SOB or lightheadedness with hypotension: Yes Has patient had a PCN reaction causing severe rash involving mucus membranes or skin necrosis: No Has patient had a PCN reaction that required hospitalization: No Has patient had a PCN reaction occurring within the last 10 years: No If all of the above answers are "NO", then may proceed with Cephalosporin use.   . Lactose Intolerance (Gi) Diarrhea and Nausea And Vomiting    Medications: I have reviewed the patient's current medications.  Results for orders placed or performed during the hospital encounter of 03/20/17 (from the past 48 hour(s))  MRSA PCR Screening     Status: None   Collection Time: 03/20/17  6:48 PM  Result Value Ref Range   MRSA by PCR NEGATIVE NEGATIVE    Comment:        The GeneXpert MRSA Assay (FDA approved for NASAL specimens only), is one component of a comprehensive MRSA colonization surveillance program. It is not intended to diagnose MRSA infection nor to guide or  monitor treatment for MRSA infections.   Procalcitonin - Baseline     Status: None   Collection Time: 03/20/17  6:56 PM  Result Value Ref Range   Procalcitonin <0.10 ng/mL    Comment:        Interpretation: PCT (Procalcitonin) <= 0.5 ng/mL: Systemic infection (sepsis) is not likely. Local bacterial infection is possible. (NOTE)         ICU PCT Algorithm               Non ICU PCT Algorithm    ----------------------------     ------------------------------         PCT < 0.25 ng/mL                 PCT < 0.1 ng/mL     Stopping of  antibiotics            Stopping of antibiotics       strongly encouraged.               strongly encouraged.    ----------------------------     ------------------------------       PCT level decrease by               PCT < 0.25 ng/mL       >= 80% from peak PCT       OR PCT 0.25 - 0.5 ng/mL          Stopping of antibiotics                                             encouraged.     Stopping of antibiotics           encouraged.    ----------------------------     ------------------------------       PCT level decrease by              PCT >= 0.25 ng/mL       < 80% from peak PCT        AND PCT >= 0.5 ng/mL            Continuin g antibiotics                                              encouraged.       Continuing antibiotics            encouraged.    ----------------------------     ------------------------------     PCT level increase compared          PCT > 0.5 ng/mL         with peak PCT AND          PCT >= 0.5 ng/mL             Escalation of antibiotics                                          strongly encouraged.      Escalation of antibiotics        strongly encouraged.   Glucose, capillary     Status: Abnormal   Collection Time: 03/20/17  9:56  PM  Result Value Ref Range   Glucose-Capillary 271 (H) 65 - 99 mg/dL   Comment 1 Notify RN    Comment 2 Document in Chart   CBC     Status: Abnormal   Collection Time: 03/21/17  4:37 AM  Result Value Ref Range   WBC 7.9 4.0 - 10.5 K/uL   RBC 3.34 (L) 3.87 - 5.11 MIL/uL   Hemoglobin 8.9 (L) 12.0 - 15.0 g/dL   HCT 29.1 (L) 36.0 - 46.0 %   MCV 87.1 78.0 - 100.0 fL   MCH 26.6 26.0 - 34.0 pg   MCHC 30.6 30.0 - 36.0 g/dL   RDW 14.1 11.5 - 15.5 %   Platelets 217 150 - 400 K/uL  Basic metabolic panel     Status: Abnormal   Collection Time: 03/21/17  4:37 AM  Result Value Ref Range   Sodium 138 135 - 145 mmol/L   Potassium 5.3 (H) 3.5 - 5.1 mmol/L   Chloride 104 101 - 111 mmol/L   CO2 25 22 - 32 mmol/L   Glucose, Bld 151 (H) 65 - 99  mg/dL   BUN 41 (H) 6 - 20 mg/dL   Creatinine, Ser 1.78 (H) 0.44 - 1.00 mg/dL   Calcium 9.6 8.9 - 10.3 mg/dL   GFR calc non Af Amer 29 (L) >60 mL/min   GFR calc Af Amer 34 (L) >60 mL/min    Comment: (NOTE) The eGFR has been calculated using the CKD EPI equation. This calculation has not been validated in all clinical situations. eGFR's persistently <60 mL/min signify possible Chronic Kidney Disease.    Anion gap 9 5 - 15  Glucose, capillary     Status: Abnormal   Collection Time: 03/21/17  8:08 AM  Result Value Ref Range   Glucose-Capillary 134 (H) 65 - 99 mg/dL   Comment 1 Document in Chart   Glucose, capillary     Status: Abnormal   Collection Time: 03/21/17 11:46 AM  Result Value Ref Range   Glucose-Capillary 250 (H) 65 - 99 mg/dL   Comment 1 Document in Chart   Glucose, capillary     Status: Abnormal   Collection Time: 03/21/17  4:20 PM  Result Value Ref Range   Glucose-Capillary 151 (H) 65 - 99 mg/dL   Comment 1 Notify RN   Glucose, capillary     Status: Abnormal   Collection Time: 03/21/17  9:03 PM  Result Value Ref Range   Glucose-Capillary 112 (H) 65 - 99 mg/dL   Comment 1 Notify RN    Comment 2 Document in Chart   Glucose, capillary     Status: Abnormal   Collection Time: 03/22/17  7:23 AM  Result Value Ref Range   Glucose-Capillary 230 (H) 65 - 99 mg/dL   Comment 1 Notify RN    Comment 2 Document in Chart   Basic metabolic panel     Status: Abnormal   Collection Time: 03/22/17  7:37 AM  Result Value Ref Range   Sodium 135 135 - 145 mmol/L   Potassium 4.4 3.5 - 5.1 mmol/L   Chloride 101 101 - 111 mmol/L   CO2 26 22 - 32 mmol/L   Glucose, Bld 215 (H) 65 - 99 mg/dL   BUN 45 (H) 6 - 20 mg/dL   Creatinine, Ser 1.71 (H) 0.44 - 1.00 mg/dL   Calcium 9.3 8.9 - 10.3 mg/dL   GFR calc non Af Amer 30 (L) >60 mL/min   GFR calc Af Wyvonnia Lora  35 (L) >60 mL/min    Comment: (NOTE) The eGFR has been calculated using the CKD EPI equation. This calculation has not been validated  in all clinical situations. eGFR's persistently <60 mL/min signify possible Chronic Kidney Disease.    Anion gap 8 5 - 15  Glucose, capillary     Status: Abnormal   Collection Time: 03/22/17 11:27 AM  Result Value Ref Range   Glucose-Capillary 251 (H) 65 - 99 mg/dL   Comment 1 Notify RN    Comment 2 Document in Chart   Glucose, capillary     Status: Abnormal   Collection Time: 03/22/17  5:00 PM  Result Value Ref Range   Glucose-Capillary 139 (H) 65 - 99 mg/dL   Comment 1 Notify RN    Comment 2 Document in Chart     Ct Soft Tissue Neck Wo Contrast  Result Date: 03/21/2017 CLINICAL DATA:  66 y/o  F; question of goiter. EXAM: CT NECK WITHOUT CONTRAST TECHNIQUE: Multidetector CT imaging of the neck was performed following the standard protocol without intravenous contrast. COMPARISON:  None. FINDINGS: Pharynx and larynx: Normal. No mass or swelling. Salivary glands: No inflammation, mass, or stone. Thyroid: Massive thyroid goiter with the right lobe of thyroid measuring 4.8 x 5.0 x 7.6 cm (AP x ML x CC series 3: Image 65 and 6:60) and the left lobe of thyroid measuring 4.3 x 5.5 x 8.3 cm. There are numerous coarse calcifications throughout the thyroid gland. The thyroid gland exerts mild mass effect on the subglottic airway which is narrowed to 1.7 x 1.0 cm (AP by ML 3:66). Lymph nodes: None enlarged or abnormal density. Vascular: Negative. Limited intracranial: Negative. Visualized orbits: Negative. Mastoids and visualized paranasal sinuses: Clear. Skeleton: Moderate cervical spondylosis with predominantly discogenic degenerative changes greatest at the C3 through C6 levels. No high-grade bony canal stenosis. Upper chest: Negative. Other: Motion artifact at the level of the larynx. IMPRESSION: 1. Massive thyroid goiter. Mild mass effect on the subglottic airway which is narrowed to 1.7 x 1.0 cm. 2. Moderate cervical spondylosis. Electronically Signed   By: Kristine Garbe M.D.   On:  03/21/2017 22:18    Review of Systems  All other systems reviewed and are negative.  Blood pressure (!) 132/55, pulse (!) 57, temperature 97.9 F (36.6 C), temperature source Oral, resp. rate 18, height _0  (1.676 m), weight 219 lb 1.6 oz (99.4 kg), SpO2 100 %. Physical Exam  Constitutional: She appears well-developed and well-nourished. No distress.  HENT:  Head: Normocephalic and atraumatic.  Right Ear: External ear normal.  Left Ear: External ear normal.  Nose: Nose normal.  Mouth/Throat: Oropharynx is clear and moist.  Eyes: Conjunctivae and EOM are normal. Pupils are equal, round, and reactive to light.  Neck: Normal range of motion.  Tracheostomy scar.  Large thyroid goiter.  Cardiovascular: Normal rate.   Respiratory: Effort normal.  Inspiratory stridor with deep inspiration.  Musculoskeletal: Normal range of motion.  Neurological: She is alert. No cranial nerve deficit.  Skin: Skin is warm and dry.  Psychiatric: She has a normal mood and affect. Her behavior is normal. Judgment and thought content normal.    Assessment/Plan: Inspiratory stridor, thyroid goiter  I discussed her situation at length with her nurse, hospitalist, and daughter.  The thyroid goiter is not a recent development and the tracheal narrowing is moderate.  I suspect that her admission was due more to pulmonary edema rather than airway problems.  It seems to be a fairly stable  problem.  In speaking with her daughter, I laid out options of observation, replacement of the tracheostomy, or total thyroidectomy.  Her daughter would favor tracheostomy replacement over thyroidectomy, if it becomes necessary.  We agreed to observe for now and consider further intervention if breathing difficulty continues to recur.  She can follow-up in my office in two weeks.  Nilda Keathley 03/22/2017, 6:00 PM

## 2017-03-22 NOTE — Clinical Social Work Note (Signed)
CSW left a voicemail for patient's daughter to discuss disposition plan.  Dayton Scrape, [place]

## 2017-03-22 NOTE — Clinical Social Work Note (Signed)
Clinical Social Work Assessment  Patient Details  Name: Angelica Beck MRN: [id number] Date of Birth: 01-23-1951  Date of referral:  03/22/17               Reason for consult:  Discharge Planning                Permission sought to share information with:  Facility Sport and exercise psychologist, Family Supports Permission granted to share information::  Yes, Verbal Permission Granted  Name::     [name]  Agency::  [facility]  Relationship::  Daughter  Contact Information:  [phone]  Housing/Transportation Living arrangements for the past 2 months:  [place] of Information:  Patient, Medical Team, Adult Children Patient Interpreter Needed:  None Criminal Activity/Legal Involvement Pertinent to Current Situation/Hospitalization:  No - Comment as needed Significant Relationships:  Adult Children Lives with:  Facility Resident Do you feel safe going back to the place where you live?  Yes Need for family participation in patient care:  Yes (Comment)  Care giving concerns:  Patient is a long-term resident at [facility].   Social Worker assessment / plan:  CSW met with patient yesterday. No supports at bedside. Patient not oriented to time and has a dementia diagnosis. CSW introduced role and explained that discharge planning would be discussed. Patient confirmed that she was admitted from [facility] but could not answer when CSW asked if she was long-term. When asked if the plan was for her to return, patient stated she was going home. Admissions coordinator at [facility] confirmed that patient is a long-term resident. Patient's daughter called CSW back today and confirmed plan to return to [facility]. Per MD in morning progression, patient will likely discharge tomorrow. Patient's daughter notified. No further concerns. CSW encouraged patient's daughter to contact CSW as needed. CSW will continue to follow patient and her daughter for support and facilitate  discharge back to SNF likely tomorrow.  Employment status:  Retired Forensic scientist:  Medicare PT Recommendations:  Not assessed at this time [place] / Referral to community resources:  [place]  Patient/Family's Response to care:  Patient not fully oriented. Patient's daughter agreeable to return to SNF. Patient's daughter supportive and involved in patient's care. Patient's daughter appreciated social work intervention.  Patient/Family's Understanding of and Emotional Response to Diagnosis, Current Treatment, and Prognosis:  Patient not fully oriented. Patient has a good understanding of the reason for admission and her need to return to SNF. Patient's daughter appears happy wit hospital care.  Emotional Assessment Appearance:  Appears stated age Attitude/Demeanor/Rapport:  Other (Pleasant) Affect (typically observed):  Appropriate, Calm, Pleasant Orientation:  Oriented to Self, Oriented to Place, Oriented to Situation Alcohol / Substance use:  Never Used Psych involvement (Current and /or in the community):  No (Comment)  Discharge Needs  Concerns to be addressed:  Care Coordination Readmission within the last 30 days:  No Current discharge risk:  Cognitively Impaired Barriers to Discharge:  Continued Medical Work up   Candie Chroman, LCSW 03/22/2017, 12:24 PM

## 2017-03-22 NOTE — NC FL2 (Signed)
[place] LEVEL OF CARE SCREENING TOOL     IDENTIFICATION  Patient Name: Angelica Beck Birthdate: 06/06/1951 Sex: female Admission Date (Current Location): 03/20/2017  [facility] [place] Number:  Herbalist and Address:  [facility], [place] [address] [place], [place] [address]      Provider Number: [id number]  Attending Physician Name and Address:  Louellen Molder, MD  Relative Name and Phone Number:       Current Level of Care: Hospital Recommended Level of Care: [place] Prior Approval Number:    Date Approved/Denied:   PASRR Number: [id number]  Discharge Plan: SNF    Current Diagnoses: Patient Active Problem List   Diagnosis Date Noted  . Dementia 03/20/2017  . SOB (shortness of breath)   . Hemoptysis 02/22/2017  . Stroke ([place])   . History of hemoptysis 02/16/2017  . Chronic bronchitis ([place]) 01/25/2017  . Acute bronchitis 12/27/2016  . CKD (chronic kidney disease) stage 3, GFR 30-59 ml/min 12/27/2016  . PNA (pneumonia) 12/21/2016  . Edema 11/02/2016  . Diastolic dysfunction 19/62/2297  . Osteoarthritis of both knees 2020-09-916  . At risk for aspiration 10/03/2016  . Normocytic normochromic anemia 09/07/2016  . Acute on chronic respiratory failure ([place])   . (HFpEF) heart failure with preserved ejection fraction ([place])   . Enteritis due to Clostridium difficile   . SBO (small bowel obstruction) ([place]) 08/10/2016  . Collapse of left lung   . S/P percutaneous endoscopic gastrostomy (PEG) tube placement ([place])   . Tracheostomy status ([place])   . HCAP (healthcare-associated pneumonia) 07/16/2016  . Encephalopathy   . Palliative care encounter   . Pressure injury of skin 06/24/2016  . Acute respiratory failure ([place]) 06/21/2016  . Septic shock ([place]) 06/20/2016  . Chronic respiratory failure with hypoxia ([place]) 11/29/2015  . Essential hypertension   . Lactic acidosis 08/02/2015  . History of CVA  (cerebrovascular accident) 08/02/2015  . Chronic systolic HF (heart failure) ([place]) 08/02/2015  . Cognitive impairment 08/02/2015  . AKI (acute kidney injury) ([place]) 02/08/2015  . Abnormality of gait 02/08/2015  . Hyperthyroidism 02/08/2015  . DM2 (diabetes mellitus, type 2) ([place]) 02/08/2015  . Obesity hypoventilation syndrome ([place]) 06/20/2013  . Hyperglycemia 06/28/2009  . HYPERLIPIDEMIA-MIXED 06/28/2009  . CAD, NATIVE VESSEL 06/28/2009  . DYSPNEA 06/28/2009    Orientation RESPIRATION BLADDER Height & Weight     Self, Situation, Place  O2 (Nasal Canula 3 L) Incontinent, External catheter Weight: 219 lb 1.6 oz (99.4 kg) Height:  5\' 6"  (167.6 cm)  BEHAVIORAL SYMPTOMS/MOOD NEUROLOGICAL BOWEL NUTRITION STATUS   (None)  (Dementia, History of CVA.) Continent Diet (Regular)  AMBULATORY STATUS COMMUNICATION OF NEEDS Skin     Verbally Other (Comment) ([place])                       Personal Care Assistance Level of Assistance              Functional Limitations Info  Sight, Hearing, Speech Sight Info: Adequate Hearing Info: Adequate Speech Info: Adequate    SPECIAL CARE FACTORS FREQUENCY  Blood pressure                    Contractures Contractures Info: Not present    Additional Factors Info  Code Status, Allergies Code Status Info: Full Allergies Info: Penicillins, Lactose Intolerance (Gi)           Current Medications (03/22/2017):  This is the current  hospital active medication list Current Facility-Administered Medications  Medication Dose Route Frequency Provider Last Rate Last Dose  . acetaminophen (TYLENOL) tablet 650 mg  650 mg Oral Q6H PRN Waldemar Dickens, MD       Or  . acetaminophen (TYLENOL) suppository 650 mg  650 mg Rectal Q6H PRN Waldemar Dickens, MD      . amLODipine (NORVASC) tablet 5 mg  5 mg Oral Daily Waldemar Dickens, MD   5 mg at 03/22/17 0855  . aspirin chewable tablet 81 mg  81 mg Oral Daily Waldemar Dickens, MD   81 mg at  03/22/17 0855  . atorvastatin (LIPITOR) tablet 40 mg  40 mg Oral QPM Waldemar Dickens, MD   40 mg at 03/21/17 1711  . budesonide (PULMICORT) nebulizer solution 0.5 mg  0.5 mg Nebulization BID Waldemar Dickens, MD   0.5 mg at 03/22/17 0753  . docusate sodium (COLACE) capsule 100 mg  100 mg Oral Daily Waldemar Dickens, MD   100 mg at 03/22/17 0855  . famotidine (PEPCID) tablet 20 mg  20 mg Oral Daily Waldemar Dickens, MD   20 mg at 03/22/17 0855  . ferrous sulfate tablet 325 mg  325 mg Oral BID WC Waldemar Dickens, MD   325 mg at 03/22/17 0855  . furosemide (LASIX) injection 40 mg  40 mg Intravenous Q12H Dhungel, Nishant, MD   40 mg at 03/22/17 0309  . heparin injection 5,000 Units  5,000 Units Subcutaneous Q8H Waldemar Dickens, MD   5,000 Units at 03/22/17 [id number]  . insulin aspart (novoLOG) injection 0-5 Units  0-5 Units Subcutaneous QHS Waldemar Dickens, MD   3 Units at 03/20/17 2254  . insulin aspart (novoLOG) injection 0-9 Units  0-9 Units Subcutaneous TID WC Waldemar Dickens, MD   5 Units at 03/22/17 1159  . insulin detemir (LEVEMIR) injection 5 Units  5 Units Subcutaneous QHS Waldemar Dickens, MD   5 Units at 03/21/17 2340  . ipratropium-albuterol (DUONEB) 0.5-2.5 (3) MG/3ML nebulizer solution 3 mL  3 mL Nebulization Q4H PRN Waldemar Dickens, MD      . magnesium hydroxide (MILK OF MAGNESIA) suspension 30 mL  30 mL Oral Daily PRN Waldemar Dickens, MD      . Melatonin TABS 3 mg  3 mg Oral QHS Waldemar Dickens, MD   3 mg at 03/21/17 2339  . memantine (NAMENDA) tablet 10 mg  10 mg Oral BID Waldemar Dickens, MD   10 mg at 03/22/17 0854  . methimazole (TAPAZOLE) tablet 10 mg  10 mg Oral Daily Waldemar Dickens, MD   10 mg at 03/22/17 0855  . metoprolol tartrate (LOPRESSOR) tablet 50 mg  50 mg Oral BID Waldemar Dickens, MD   50 mg at 03/22/17 0854  . multivitamin (RENA-VIT) tablet 1 tablet  1 tablet Oral QHS Waldemar Dickens, MD   1 tablet at 03/21/17 2340  . ondansetron (ZOFRAN) tablet 4 mg  4 mg Oral  Q8H PRN Waldemar Dickens, MD      . promethazine (PHENERGAN) tablet 12.5 mg  12.5 mg Oral Q6H PRN Waldemar Dickens, MD         Discharge Medications: Please see discharge summary for a list of discharge medications.  Relevant Imaging Results:  Relevant Lab Results:   Additional Information SS#: [id number]  Candie Chroman, LCSW

## 2017-03-22 NOTE — Progress Notes (Signed)
Name: Angelica Beck MRN: [id number] DOB: [birth year]    ADMISSION DATE:  03/20/2017 CONSULTATION DATE:  03/20/2017  REFERRING MD :  Dr. Marily Memos  CHIEF COMPLAINT:  Hypoxia   HISTORY OF PRESENT ILLNESS:  Patient is confused and therefore HPI obtained from chart review.  66 year old female with PMH of dementia, OSH, asthma, grade 1 diastolic HF (EF 59% 9/35), DM, HLD, HTN, hyperthyroidism, and CVA who presented with hypoxia and shortness of breath admitted to [facility] as primary service.  Additionally, patient had a thyroid isthmusectomy and tracheostomy placement 06/2016 due to acute respiratory failure with failed extubation attempts secondary to a large thyroid goiter.  She was decannulated in our trach clinic in 09/2016.  She is followed by Dr. Halford Chessman for OHS and on 2L O2 at night.  Patient is a resident of [facility] and a full code.  She represented with one day history of shortness of breath and found to have room air sats of 58% on room air.  Her PCP treated with neb and O2 and was sent to ER.  She has been afebrile and 100% on 2L nasal cannula.  WBC 11.9, PCT <0.10.  Additioinally had some upper airway stridor and was treated with decadron. She was admitted for acute on chronic respiratory failure and mild pulmonary edema by TRH.  PCCM consulted for recommendations for acute on chronic respiratory failure.   SUBJECTIVE: No acute events.  VITAL SIGNS: Temp:  [97.6 F (36.4 C)-98.5 F (36.9 C)] 97.6 F (36.4 C) (06/28 0454) Pulse Rate:  [65-84] 65 (06/28 0454) Resp:  [20] 20 (06/28 0454) BP: (141-173)/(56-68) 165/59 (06/28 0454) SpO2:  [98 %-100 %] 98 % (06/28 0754) Weight:  [99.4 kg (219 lb 1.6 oz)] 99.4 kg (219 lb 1.6 oz) (06/28 0454)  PHYSICAL EXAMINATION: General:  Adult female sitting in bed in NAD HEENT: MM pink/moist, AT/[place], previous trach scar, large goiter noted, unable to appreciate JVD secondary to goiter, soft raspy voice  Neuro:  A&Ox 3 , no deficits CV: RRR, no  M/R/G PULM: Shallow/non-labored, CTAB GI: obese, soft, non-tender, bsx4 active  Extremities: warm/dry, +1 BLE edema  Skin: no rashes or lesions   Recent Labs Lab 03/20/17 1416 03/21/17 0437 03/22/17 0737  NA 138 138 135  K 4.5 5.3* 4.4  CL 105 104 101  CO2 24 25 26   BUN 36* 41* 45*  CREATININE 1.58* 1.78* 1.71*  GLUCOSE 211* 151* 215*    Recent Labs Lab 03/20/17 03/20/17 1416 03/21/17 0437  HGB 9.1* 9.3* 8.9*  HCT 30* 29.6* 29.1*  WBC 8.3 11.9* 7.9  PLT 209 216 217   Dg Chest 2 View  Result Date: 03/20/2017 CLINICAL DATA:  Shortness of breath without chest pain. History of asthma, diabetes, and hypertension. Nonsmoker. EXAM: CHEST  2 VIEW COMPARISON:  Chest x-ray dated Feb 06, 2017 FINDINGS: The lungs are borderline hypoinflated. The interstitial markings are mildly increased. The pulmonary vascularity is engorged. The cardiac silhouette is enlarged. There is no pleural effusion or alveolar infiltrate. The bony thorax exhibits no acute abnormality. There is deviation of the trachea toward the right which is stable. IMPRESSION: CHF with mild pulmonary interstitial edema. No alveolar pneumonia or pleural effusion. Chronic deviation of the trachea toward the right is more conspicuous than on the studies of earlier this year. This may be secondary to a goiter or to other superior mediastinal mass. Thyroid ultrasound would be a useful next imaging step. Electronically Signed   By: David  Martinique M.D.  On: 03/20/2017 12:40   Ct Soft Tissue Neck Wo Contrast  Result Date: 03/21/2017 CLINICAL DATA:  65 y/o  F; question of goiter. EXAM: CT NECK WITHOUT CONTRAST TECHNIQUE: Multidetector CT imaging of the neck was performed following the standard protocol without intravenous contrast. COMPARISON:  None. FINDINGS: Pharynx and larynx: Normal. No mass or swelling. Salivary glands: No inflammation, mass, or stone. Thyroid: Massive thyroid goiter with the right lobe of thyroid measuring 4.8 x 5.0  x 7.6 cm (AP x ML x CC series 3: Image 65 and 6:60) and the left lobe of thyroid measuring 4.3 x 5.5 x 8.3 cm. There are numerous coarse calcifications throughout the thyroid gland. The thyroid gland exerts mild mass effect on the subglottic airway which is narrowed to 1.7 x 1.0 cm (AP by ML 3:66). Lymph nodes: None enlarged or abnormal density. Vascular: Negative. Limited intracranial: Negative. Visualized orbits: Negative. Mastoids and visualized paranasal sinuses: Clear. Skeleton: Moderate cervical spondylosis with predominantly discogenic degenerative changes greatest at the C3 through C6 levels. No high-grade bony canal stenosis. Upper chest: Negative. Other: Motion artifact at the level of the larynx. IMPRESSION: 1. Massive thyroid goiter. Mild mass effect on the subglottic airway which is narrowed to 1.7 x 1.0 cm. 2. Moderate cervical spondylosis. Electronically Signed   By: Kristine Garbe M.D.   On: 03/21/2017 22:18    ASSESSMENT / PLAN:  Acute on chronic hypoxic respiratory failure. Acute on chronic diastolic heart failure. Asthma/ COPD per report - no PFT's done. Chronic enlarged goiter s/p thyroid isthmusectomy in 06/2016, biopsy neg for malignancy. Agree that this is likely multifactorial but mainly related to acute on chronic diastolic heart failure given her pulmonary edema on CXR and BLE edema.  Her weight is up ~10 lbs this admit compared to weight of 211 lbs on 12/20/16.  Her hypoxia has improved and is now stable on 2L [place] at 100%.  Her stridor is positional with chronic right tracheal deviation 2/2 chronic obstructive goiter, questionably slightly more than prior but chronic.  She does not appear infectious, PCT is reassuring. P:  Continue supplemental O2 as needed to maintain SpO2 > 92%. Continue diuresis as tolerated. Monitor I/O's which will be somewhat difficult given urinary incontinence. Continue BD's. No role steroids. Aggressive pulmonary hygiene. Limit sedating  medications. Would recommend ENT consult given massive goiter with mild mass effect on subglottic airway; though doubt thyroidectomy and / or repeat trach needed.  Rest per primary team.   Nothing further to add.  PCCM will sign off.  Please do not hesitate to call us back if we can be of any further assistance.   Montey Hora, [place] [facility] Pager: [phone]  or [phone] 03/22/2017, 9:01 AM

## 2017-03-23 DIAGNOSIS — E049 Nontoxic goiter, unspecified: Secondary | ICD-10-CM

## 2017-03-23 DIAGNOSIS — Q315 Congenital laryngomalacia: Secondary | ICD-10-CM | POA: Diagnosis present

## 2017-03-23 DIAGNOSIS — I5033 Acute on chronic diastolic (congestive) heart failure: Secondary | ICD-10-CM | POA: Diagnosis present

## 2017-03-23 DIAGNOSIS — J385 Laryngeal spasm: Secondary | ICD-10-CM

## 2017-03-23 DIAGNOSIS — E040 Nontoxic diffuse goiter: Secondary | ICD-10-CM | POA: Diagnosis present

## 2017-03-23 LAB — GLUCOSE, CAPILLARY
GLUCOSE-CAPILLARY: 168 mg/dL — AB (ref 65–99)
Glucose-Capillary: 102 mg/dL — ABNORMAL HIGH (ref 65–99)

## 2017-03-23 MED ORDER — FUROSEMIDE 20 MG PO TABS: 20.0000 mg | ORAL_TABLET | Freq: Every day | ORAL | 0 refills | Status: DC

## 2017-03-23 MED ORDER — ORAL CARE MOUTH RINSE
15.0000 mL | Freq: Two times a day (BID) | OROMUCOSAL | Status: DC
  Administered 2017-03-23: 15 mL via OROMUCOSAL

## 2017-03-23 NOTE — Progress Notes (Signed)
Pt d/c with [facility] for transport. Pt denied complaints personal belongings in pt possession. Pts paperwork was provided to [facility].

## 2017-03-23 NOTE — Discharge Summary (Addendum)
Physician Discharge Summary  Oluwaseun Bruyere PTW:[id number] DOB: 09-10-1951 DOA: 03/20/2017  PCP: [name], MD  Admit date: 03/20/2017 Discharge date: 03/23/2017  Admitted From: Skilled nursing facility Disposition:  Skilled nursing facility [facility])   Recommendations for Outpatient Follow-up:  1. Follow up with M.D. at SNF in 1 week. 2. Patient has inspiratory stridor which is demonstrated on certain position and is otherwise asymptomatic unless she is hypoxic or in respiratory distress.  3. Patient to follow-up with Dr. Redmond Baseman in 2 weeks.  Home Health: None Equipment/Devices: 2 L via nasal cannula. As per therapy in the facility  Discharge Condition: Fair CODE STATUS: Full code Diet recommendation: Heart Healthy / Carb Modified    Discharge Diagnoses:  Principal Problem:   Acute on chronic respiratory failure with hypoxia (HCC)   Active Problems:   Acute on chronic diastolic CHF (congestive heart failure) (HCC)   Hyperthyroidism   History of CVA (cerebrovascular accident)   Essential hypertension   CKD (chronic kidney disease) stage 3, GFR 30-59 ml/min   Dementia   Laryngeal stridor   Goiter diffuse  Brief narrative/history of present illness Please refer to admission H&P for details, in brief, 66 year old female with history of  vascular dementia, stroke, diabetes mellitus type 2, hypertension, hyperthyroidism, hospitalized in 06/2016 reason respiratory failure requiring tracheostomy, isthmectomy for large goiter status post trach which was decannulated in 09/2016. She is currently on 2 L oxygen at night. Patient presented from SNF with shortness of breath and hypoxia with stridor.  chest x-ray showing pulmonary edema. Patient admitted for further management. PC CM consulted.  Hospital course  Principal problem Acute on chronic hypoxic respiratory failure Secondary to acute pulmonary edema with diastolic dysfunction. Continue IV Lasix 40 mg twice a day.  strict I/O and daily weight. Pulmonary consult appreciated.  Prior Polysomnography results show that she does not have OSA/OHS.  Inspiratory Stridor CT of the neck shows massive goiter with mild mass effect on the subglottic airway with some narrowing. The stridor is likely because of this and is mainly positional.  patient is not in respiratory distress or gets hypoxic.  ENT consult appreciated. Dr. Redmond Baseman evaluated the patient and her CT of the neck. She has moderate tracheal narrowing and that she is hospitalized for acute diastolic failure rather than airway compromise. He discussed with patient's daughter and gave options of either replacement of the tracheostomy or total thyroidectomy. The daughter was in favor of tracheostomy replacement if necessary in future. Plan is to observe her for now. She will be followed up with ENT in 2 weeks.    Acute on chronic diastolic CHF Last echo with EF of 60% and grade 1 diastolic dysfunction. BNP normal. Received IV Lasix twice daily with good diuresis. Will increase her home Lasix dose to 20 mg daily.. Her oxygenation is stable.  Acute on Chronic kidney disease stage 2-3 Mildly worsened but stable while on diuresis.  Diabetes mellitus type 2 Continue bedtime long-acting insulin.  Hypertension Stable. Continue amlodipine and metoprolol.  Hyperlipidemia Continue Synthroid  Hyperthyroidism with goiter Continue Tapazole. Last TSH normal.  Vascular dementia Continue Namenda     Family Communication  : daughter updated on the phone   Disposition Plan  : Return to SNF   Consults  :   [facility] CM ENT  Procedures  :  CT of the neck   Discharge Instructions   Allergies as of 03/23/2017      Reactions   Penicillins Swelling   FACIAL SWELLING Has  patient had a PCN reaction causing immediate rash, facial/tongue/throat swelling, SOB or lightheadedness with hypotension: Yes Has patient had a PCN reaction causing severe rash  involving mucus membranes or skin necrosis: No Has patient had a PCN reaction that required hospitalization: No Has patient had a PCN reaction occurring within the last 10 years: No If all of the above answers are "NO", then may proceed with Cephalosporin use.   Lactose Intolerance (gi) Diarrhea, Nausea And Vomiting      Medication List    TAKE these medications   acetaminophen 500 MG tablet Commonly known as:  TYLENOL Take 500 mg by mouth every 6 (six) hours as needed for mild pain.   amLODipine 5 MG tablet Commonly known as:  NORVASC Take 1 tablet (5 mg total) by mouth daily.   aspirin 81 MG chewable tablet Chew 81 mg by mouth daily.   atorvastatin 40 MG tablet Commonly known as:  LIPITOR Take 40 mg by mouth every evening.   b complex-vitamin c-folic acid 0.8 MG Tabs tablet Take 1 tablet by mouth every morning.   bisacodyl 10 MG suppository Commonly known as:  DULCOLAX Place 10 mg rectally daily as needed for moderate constipation.   budesonide 0.5 MG/2ML nebulizer solution Commonly known as:  PULMICORT Take 0.5 mg by nebulization 2 (two) times daily.   cholecalciferol 1000 units tablet Commonly known as:  VITAMIN D Take 1,000 Units by mouth daily.   docusate sodium 100 MG capsule Commonly known as:  COLACE Take 100 mg by mouth daily.   famotidine 20 MG tablet Commonly known as:  PEPCID Take 20 mg by mouth daily.   ferrous sulfate 325 (65 FE) MG tablet Take 325 mg by mouth 2 (two) times daily with a meal.   furosemide 20 MG tablet Commonly known as:  LASIX Take 1 tablet (20 mg total) by mouth daily. What changed:  how much to take  additional instructions   insulin detemir 100 UNIT/ML injection Commonly known as:  LEVEMIR Inject 5 Units into the skin at bedtime.   ipratropium-albuterol 0.5-2.5 (3) MG/3ML Soln Commonly known as:  DUONEB Take 3 mLs by nebulization every 4 (four) hours as needed (for dyspnea).   magnesium hydroxide 400 MG/5ML  suspension Commonly known as:  MILK OF MAGNESIA Take 30 mLs by mouth daily as needed for mild constipation.   Melatonin 3 MG Tabs Take 3 mg by mouth at bedtime.   memantine 10 MG tablet Commonly known as:  NAMENDA Take 10 mg by mouth 2 (two) times daily.   methimazole 10 MG tablet Commonly known as:  TAPAZOLE Take 10 mg by mouth daily.   metoprolol tartrate 50 MG tablet Commonly known as:  LOPRESSOR Take 50 mg by mouth 2 (two) times daily.   ondansetron 4 MG tablet Commonly known as:  ZOFRAN Take 4 mg by mouth every 8 (eight) hours as needed for nausea or vomiting.   RA SALINE ENEMA RE Place 1 Applicatorful rectally daily as needed (constipation).   trolamine salicylate 10 % cream Commonly known as:  ASPERCREME Apply 1 application topically 2 (two) times daily.      Contact information for after-discharge care    Destination    [facility] .   Specialty:  [place] information: [address] [address] [phone]             Allergies  Allergen Reactions  . Penicillins Swelling    FACIAL SWELLING Has  patient had a PCN reaction causing immediate rash, facial/tongue/throat swelling, SOB or lightheadedness with hypotension: Yes Has patient had a PCN reaction causing severe rash involving mucus membranes or skin necrosis: No Has patient had a PCN reaction that required hospitalization: No Has patient had a PCN reaction occurring within the last 10 years: No If all of the above answers are "NO", then may proceed with Cephalosporin use.   . Lactose Intolerance (Gi) Diarrhea and Nausea And Vomiting     Procedures/Studies: Dg Chest 2 View  Result Date: 03/20/2017 CLINICAL DATA:  Shortness of breath without chest pain. History of asthma, diabetes, and hypertension. Nonsmoker. EXAM: CHEST  2 VIEW COMPARISON:  Chest x-ray dated Feb 06, 2017 FINDINGS: The lungs are borderline hypoinflated.  The interstitial markings are mildly increased. The pulmonary vascularity is engorged. The cardiac silhouette is enlarged. There is no pleural effusion or alveolar infiltrate. The bony thorax exhibits no acute abnormality. There is deviation of the trachea toward the right which is stable. IMPRESSION: CHF with mild pulmonary interstitial edema. No alveolar pneumonia or pleural effusion. Chronic deviation of the trachea toward the right is more conspicuous than on the studies of earlier this year. This may be secondary to a goiter or to other superior mediastinal mass. Thyroid ultrasound would be a useful next imaging step. Electronically Signed   By: David  Martinique M.D.   On: 03/20/2017 12:40   Ct Soft Tissue Neck Wo Contrast  Result Date: 03/21/2017 CLINICAL DATA:  66 y/o  F; question of goiter. EXAM: CT NECK WITHOUT CONTRAST TECHNIQUE: Multidetector CT imaging of the neck was performed following the standard protocol without intravenous contrast. COMPARISON:  None. FINDINGS: Pharynx and larynx: Normal. No mass or swelling. Salivary glands: No inflammation, mass, or stone. Thyroid: Massive thyroid goiter with the right lobe of thyroid measuring 4.8 x 5.0 x 7.6 cm (AP x ML x CC series 3: Image 65 and 6:60) and the left lobe of thyroid measuring 4.3 x 5.5 x 8.3 cm. There are numerous coarse calcifications throughout the thyroid gland. The thyroid gland exerts mild mass effect on the subglottic airway which is narrowed to 1.7 x 1.0 cm (AP by ML 3:66). Lymph nodes: None enlarged or abnormal density. Vascular: Negative. Limited intracranial: Negative. Visualized orbits: Negative. Mastoids and visualized paranasal sinuses: Clear. Skeleton: Moderate cervical spondylosis with predominantly discogenic degenerative changes greatest at the C3 through C6 levels. No high-grade bony canal stenosis. Upper chest: Negative. Other: Motion artifact at the level of the larynx. IMPRESSION: 1. Massive thyroid goiter. Mild mass effect  on the subglottic airway which is narrowed to 1.7 x 1.0 cm. 2. Moderate cervical spondylosis. Electronically Signed   By: Kristine Garbe M.D.   On: 03/21/2017 22:18       Subjective: No overnight events. Denies any shortness of breath or chest discomfort. Limited history.  Physical exam Gen.: Elderly female not in distress, HEENT:, No JVD, enlarged goiter, prior tracheostomy scar Chest: Scattered rhonchi (improved), has positional inspiratory stridor CVS: Normal S1 and S2, no murmurs GI: Soft, nondistended, nontender tender Musculoskeletal:  trace pitting edema bilaterally (improved from admission)    The results of significant diagnostics from this hospitalization (including imaging, microbiology, ancillary and laboratory) are listed below for reference.     Microbiology: Recent Results (from the past 240 hour(s))  MRSA PCR Screening     Status: None   Collection Time: 03/20/17  6:48 PM  Result Value Ref Range Status   MRSA by PCR NEGATIVE NEGATIVE Final  Comment:        The GeneXpert MRSA Assay (FDA approved for NASAL specimens only), is one component of a comprehensive MRSA colonization surveillance program. It is not intended to diagnose MRSA infection nor to guide or monitor treatment for MRSA infections.      Labs: BNP (last 3 results)  Recent Labs  12/26/16 2354 02/06/17 2057 03/20/17 1416  BNP 22.6 100.3* 84.5   Basic Metabolic Panel:  Recent Labs Lab 03/20/17 1416 03/21/17 0437 03/22/17 0737  NA 138 138 135  K 4.5 5.3* 4.4  CL 105 104 101  CO2 24 25 26   GLUCOSE 211* 151* 215*  BUN 36* 41* 45*  CREATININE 1.58* 1.78* 1.71*  CALCIUM 9.3 9.6 9.3   Liver Function Tests: No results for input(s): AST, ALT, ALKPHOS, BILITOT, PROT, ALBUMIN in the last 168 hours. No results for input(s): LIPASE, AMYLASE in the last 168 hours. No results for input(s): AMMONIA in the last 168 hours. CBC:  Recent Labs Lab 03/20/17 03/20/17 1416  03/21/17 0437  WBC 8.3 11.9* 7.9  NEUTROABS 6 10.1*  --   HGB 9.1* 9.3* 8.9*  HCT 30* 29.6* 29.1*  MCV  --  87.3 87.1  PLT 209 216 217   Cardiac Enzymes: No results for input(s): CKTOTAL, CKMB, CKMBINDEX, TROPONINI in the last 168 hours. BNP: Invalid input(s): POCBNP CBG:  Recent Labs Lab 03/22/17 0723 03/22/17 1127 03/22/17 1700 03/22/17 2102 03/23/17 0741  GLUCAP 230* 251* 139* 206* 102*   D-Dimer No results for input(s): DDIMER in the last 72 hours. Hgb A1c No results for input(s): HGBA1C in the last 72 hours. Lipid Profile No results for input(s): CHOL, HDL, LDLCALC, TRIG, CHOLHDL, LDLDIRECT in the last 72 hours. Thyroid function studies No results for input(s): TSH, T4TOTAL, T3FREE, THYROIDAB in the last 72 hours.  Invalid input(s): FREET3 Anemia work up No results for input(s): VITAMINB12, FOLATE, FERRITIN, TIBC, IRON, RETICCTPCT in the last 72 hours. Urinalysis    Component Value Date/Time   COLORURINE STRAW (A) 12/22/2016 1529   APPEARANCEUR HAZY (A) 12/22/2016 1529   LABSPEC 1.011 12/22/2016 1529   PHURINE 5.0 12/22/2016 1529   GLUCOSEU NEGATIVE 12/22/2016 1529   HGBUR SMALL (A) 12/22/2016 1529   BILIRUBINUR NEGATIVE 12/22/2016 1529   KETONESUR NEGATIVE 12/22/2016 1529   PROTEINUR NEGATIVE 12/22/2016 1529   UROBILINOGEN 0.2 08/02/2015 0935   NITRITE NEGATIVE 12/22/2016 1529   LEUKOCYTESUR SMALL (A) 12/22/2016 1529   Sepsis Labs Invalid input(s): PROCALCITONIN,  WBC,  LACTICIDVEN Microbiology Recent Results (from the past 240 hour(s))  MRSA PCR Screening     Status: None   Collection Time: 03/20/17  6:48 PM  Result Value Ref Range Status   MRSA by PCR NEGATIVE NEGATIVE Final    Comment:        The GeneXpert MRSA Assay (FDA approved for NASAL specimens only), is one component of a comprehensive MRSA colonization surveillance program. It is not intended to diagnose MRSA infection nor to guide or monitor treatment for MRSA infections.       Time coordinating discharge: Over 30 minutes  SIGNED:   Louellen Molder, MD  [facility] 03/23/2017, 10:57 AM Pager   If 7PM-7AM, please contact night-coverage [email or web address] Password [email or web address]

## 2017-03-23 NOTE — Clinical Social Work Note (Signed)
CSW facilitated patient discharge including contacting patient family and facility to confirm patient discharge plans. Clinical information faxed to facility and family agreeable with plan. CSW arranged ambulance transport via [facility] to [facility] at 1:30 pm. RN to call report prior to discharge [phone]).  CSW will sign off for now as social work intervention is no longer needed. Please consult [place] again if new needs arise.  Dayton Scrape, [place]

## 2017-03-26 ENCOUNTER — Non-Acute Institutional Stay (SKILLED_NURSING_FACILITY): Payer: Medicare Other | Admitting: Internal Medicine

## 2017-03-26 ENCOUNTER — Encounter: Payer: Self-pay | Admitting: Internal Medicine

## 2017-03-26 DIAGNOSIS — E049 Nontoxic goiter, unspecified: Secondary | ICD-10-CM

## 2017-03-26 DIAGNOSIS — E1121 Type 2 diabetes mellitus with diabetic nephropathy: Secondary | ICD-10-CM | POA: Diagnosis not present

## 2017-03-26 DIAGNOSIS — I5033 Acute on chronic diastolic (congestive) heart failure: Secondary | ICD-10-CM | POA: Diagnosis not present

## 2017-03-26 DIAGNOSIS — E040 Nontoxic diffuse goiter: Secondary | ICD-10-CM

## 2017-03-26 NOTE — Assessment & Plan Note (Signed)
Increase basal insulin and monitor glucoses

## 2017-03-26 NOTE — Progress Notes (Signed)
    NURSING HOME LOCATION:  [facility] ROOM NUMBER:  [id number]  CODE STATUS:  Full Code  PCP:  [name], MD  [address] [place] [address]  This is a nursing facility follow up for [facility] readmission within 30 days  Interim medical record and care since last [place] visit was updated with review of diagnostic studies and change in clinical status since last visit were documented.  HPI: The patient was hospitalized 6/26-6/29/18 with acute on chronic respiratory failure with hypoxia. She presented with increased shortness of breath with O2 sats as low as 58. On exam she exhibited intermittent stridor. This is in the context of  previous history of tracheostomy. At the emergency room pulmonary edema was documented on Xray, diagnosed as chronic hypoxic respiratory failure due to edema in the setting of diastolic dysfunction. CT of the neck revealed a massive goiter with mild mass effect on the subglottic airway with some narrowing. This was felt to be the cause of the stridor. This appeared clinically to be mainly positional. Dr. Redmond Baseman , ENT discussed options with her daughter. Those options would be replacement of tracheostomy or total thyroidectomy. Tracheostomy was favored by the daughter if airway compromise was thought to be progressive.  Review of systems: Dementia invalidated responses.She was unable to give the date or name the president.  She categorically denies any active symptoms at this time.  At the SNF fasting glucoses have varied from 156-335. Nonfasting glucose is ranged from 152-413. The patient has been noncompliant with diabetic diet. She is on extremely small doses of basal insulin at 5 mg daily.  Constitutional: No fever,significant weight change, fatigue  Cardiovascular: No chest pain, palpitations,paroxysmal nocturnal dyspnea, claudication, edema  Respiratory: No cough, sputum production,hemoptysis, DOE , significant snoring,apnea   Gastrointestinal: No heartburn,dysphagia,abdominal pain, nausea / vomiting,rectal bleeding, melena,change in bowels Genitourinary: No dysuria,hematuria, pyuria,  incontinence, nocturia Neurologic: No dizziness,headache,syncope, seizures, numbness , tingling Psychiatric: No significant anxiety , depression, insomnia, anorexia Hematologic/lymphatic: No significant bruising, lymphadenopathy,abnormal bleeding Allergy/immunology: No itchy/ watery eyes, significant sneezing, urticaria, angioedema  Physical exam:  Pertinent or positive findings:She speaks in a faint whisper. Facies somewhat blank. She is completely edentulous. She has a large asymmetric goiter. With hyperventilation there is slight rhonchi over the upper airway. She also exhibits diffuse somewhat homogenous low-grade rhonchi over thorax. Heart sounds are somewhat distant. Abdomen is protuberant. Pulses are decreased. She exhibits weakness in the lower extremities.   General appearance:Adequately nourished; no acute distress , increased work of breathing is present.   Lymphatic: No lymphadenopathy about the head, neck, axilla . Eyes: No conjunctival inflammation or lid edema is present. There is no scleral icterus. Ears:  External ear exam shows no significant lesions or deformities.   Nose:  External nasal examination shows no deformity or inflammation. Nasal mucosa are pink and moist without lesions ,exudates Oral exam: lips and gums are healthy appearing.There is no oropharyngeal erythema or exudate . Neck:  No thyromegaly, masses, tenderness noted.    Heart:  Normal rate and regular rhythm. S1 and S2 normal without gallop, murmur, click, rub .  Abdomen:Bowel sounds are normal. Abdomen is soft and nontender with no organomegaly, hernias,masses. GU: deferred  Extremities:  No cyanosis, clubbing,edema  Skin: Warm & dry w/o tenting. No significant lesions or rash.  See summary under each active problem in the Problem List with  associated updated therapeutic plan

## 2017-03-26 NOTE — Assessment & Plan Note (Addendum)
Most likely airway compromise due to acute bronchospasm, massive goiter and some element of tracheal stenosis contributed to the acute decompensation Clinically compensated @ this time but she may need a higher dose of furosemide

## 2017-03-26 NOTE — Assessment & Plan Note (Signed)
The patient has dementia and decannulated herself at least 5 times. Repeat tracheostomy is likely to have the same risk

## 2017-03-26 NOTE — Patient Instructions (Signed)
See assessment and plan under each diagnosis in the problem list and acutely for this visit 

## 2017-03-30 IMAGING — CR DG ABD PORTABLE 1V
1 series · 1 of 1 positions shown · non-contrast
Comparison: 07/17/2016

CLINICAL DATA: Check PEG tube placement

EXAM:
PORTABLE ABDOMEN - 1 VIEW

[AP]
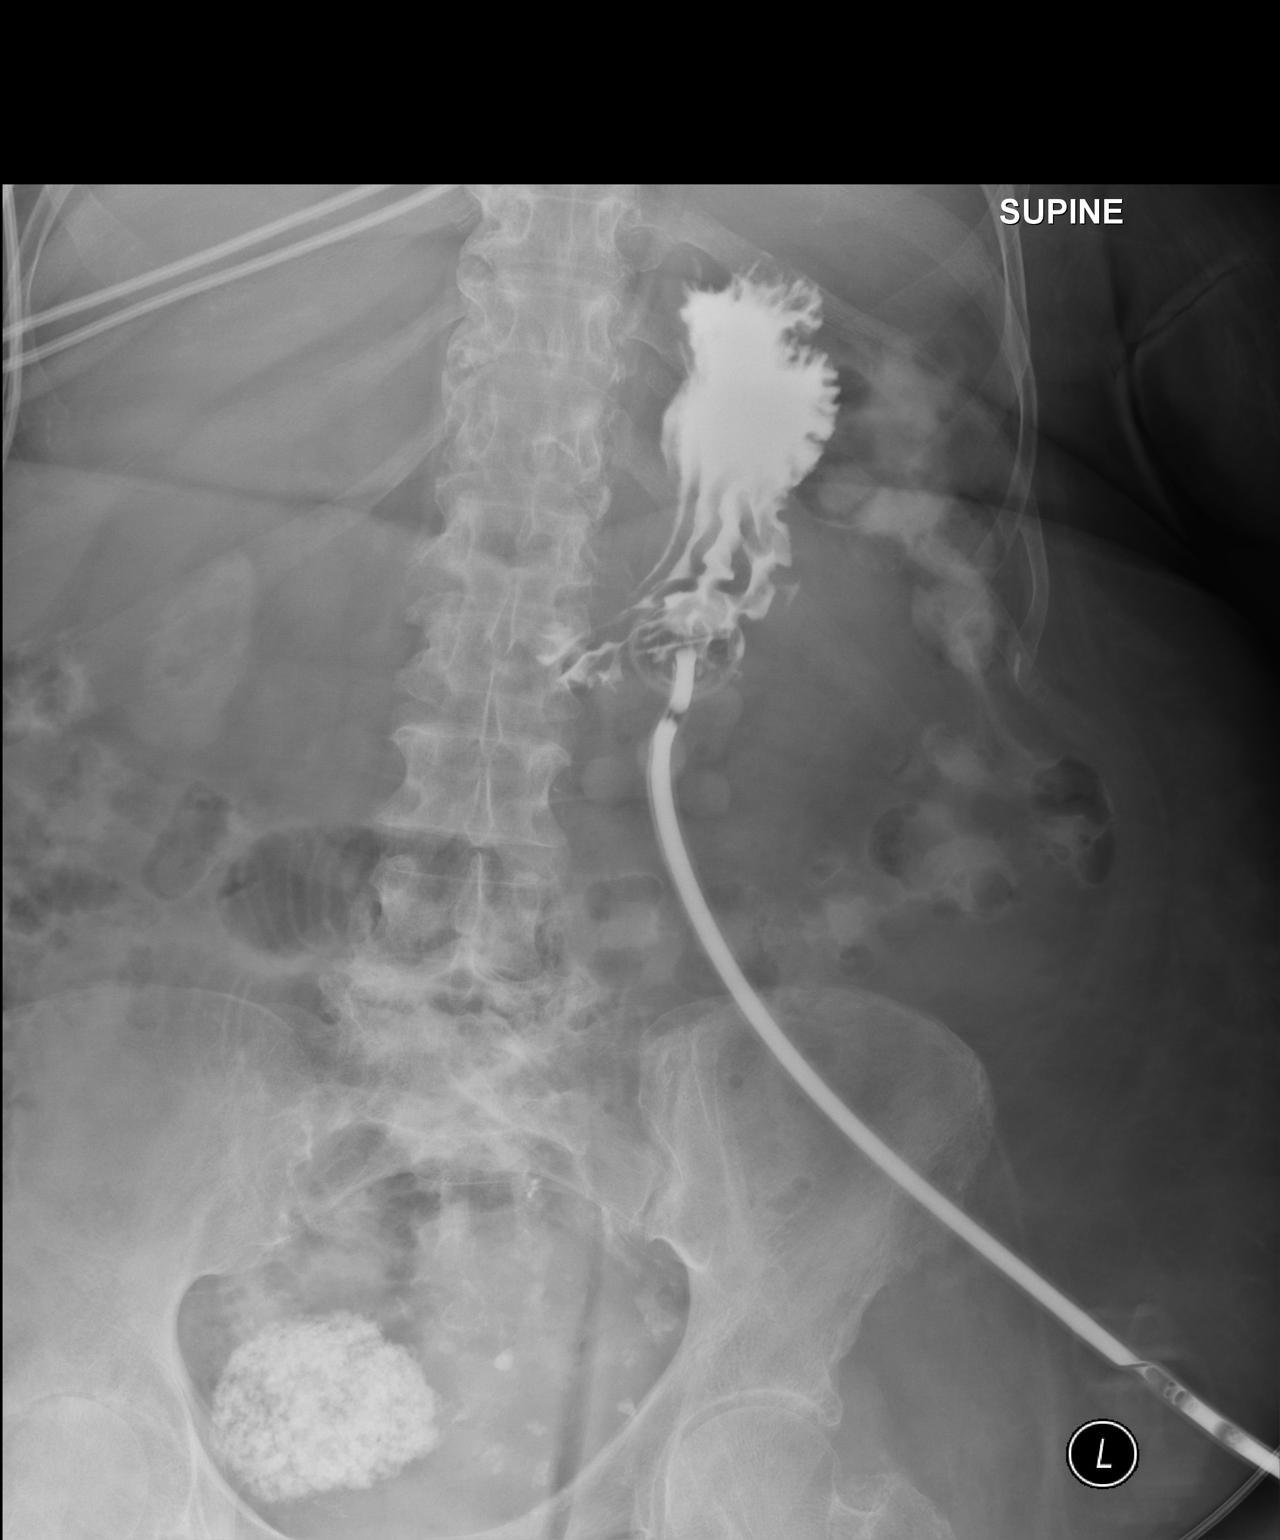

[1 of 1 positions shown; findings below may reference images not displayed]

FINDINGS: The tip of a left-sided percutaneous gastrostomy tube is seen
outlined by contrast within the lumen of the stomach. No
extravasation of contrast. There is pre-existing enteric contrast
within the descending through sigmoid colon. A coarsely calcified
uterine fibroid is seen in the right hemipelvis.
IMPRESSION: PEG tube is seen within the gastric body.

## 2017-03-30 IMAGING — DX DG CHEST 1V PORT
1 series · 1 of 1 positions shown · non-contrast
Comparison: 07/15/2016

CLINICAL DATA: Respiratory failure. Chronic kidney disease.
Diabetes.

EXAM:
PORTABLE CHEST 1 VIEW

[chest ap]
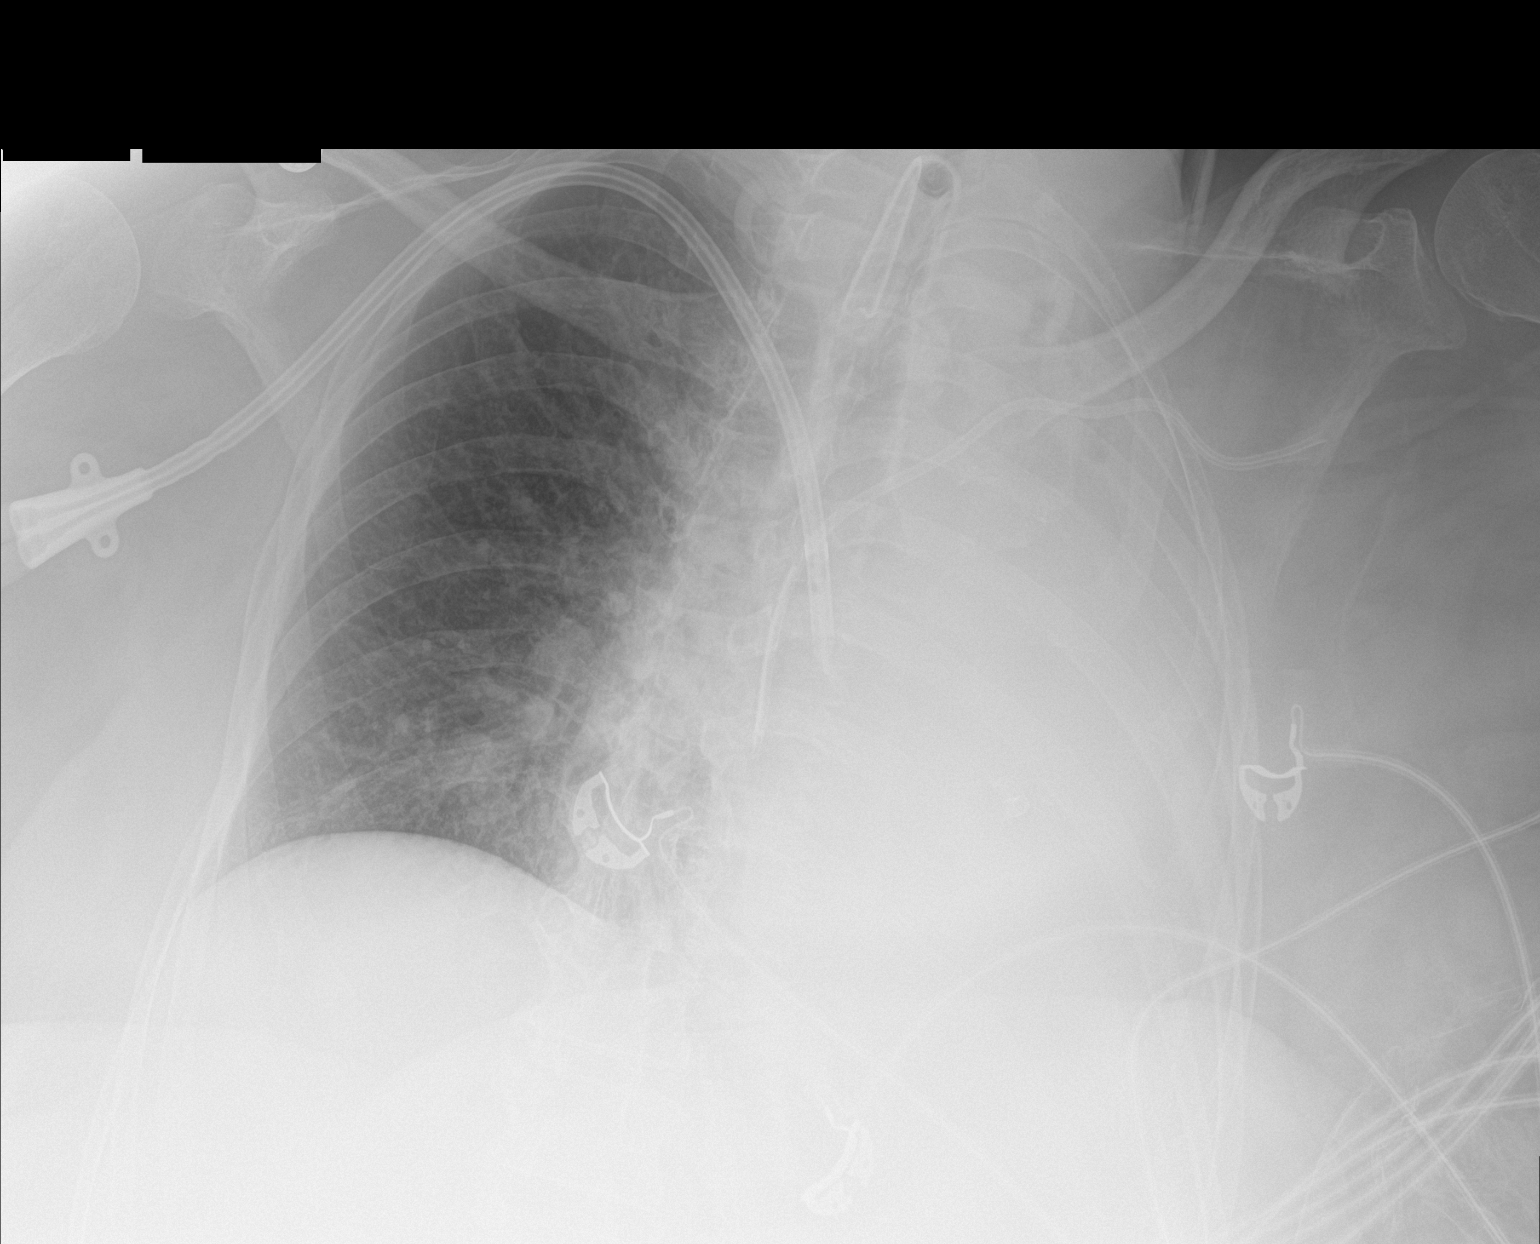

[1 of 1 positions shown; findings below may reference images not displayed]

FINDINGS: Right-sided dialysis catheter terminates at the mid SVC. Left-sided
subclavian line terminates at the low SVC. Patient rotated left.
Tracheostomy appropriately positioned. Heart size not well
evaluated. Whiteout of the left hemi thorax. Clear right lung. No
pneumothorax.
IMPRESSION: Significantly worsened left-sided aeration, with with development of
whiteout. This is likely due to a combination of pleural fluid and
airspace disease. Mucous plugging could cause a portion this, given
relative volume loss.

## 2017-04-05 ENCOUNTER — Encounter: Payer: Self-pay | Admitting: Adult Health

## 2017-04-05 ENCOUNTER — Non-Acute Institutional Stay (SKILLED_NURSING_FACILITY): Payer: Medicare Other | Admitting: Adult Health

## 2017-04-05 DIAGNOSIS — N183 Chronic kidney disease, stage 3 unspecified: Secondary | ICD-10-CM

## 2017-04-05 DIAGNOSIS — R600 Localized edema: Secondary | ICD-10-CM

## 2017-04-05 DIAGNOSIS — I5032 Chronic diastolic (congestive) heart failure: Secondary | ICD-10-CM

## 2017-04-05 NOTE — Progress Notes (Addendum)
DATE:  04/05/2017   MRN:  [id number]  BIRTHDAY: Apr 17, 1951  Facility:  Nursing Home Location:  [facility] Room Number: 218-B  LEVEL OF CARE:  SNF (31)  Contact Information    Name Relation Home Work Mobile   Tisdale,[name] Daughter   [phone]       Code Status History    Date Active Date Inactive Code Status Order ID Comments User Context   03/20/2017  5:57 PM 03/23/2017  4:55 PM Full Code [id number]  Waldemar Dickens, MD ED   12/21/2016  7:04 PM 12/24/2016  4:42 PM Full Code [id number]  Elmarie Shiley, MD Inpatient   08/19/2016  1:30 AM 09/06/2016 11:12 PM Full Code [id number]  Esmond Camper Inpatient   08/10/2016  8:32 PM 08/18/2016  7:29 PM Full Code [id number]  Mayo, Pete Pelt, MD Inpatient   07/18/2016  8:34 PM 08/10/2016  3:09 PM Full Code [id number]  Gordan Payment, CNA Inpatient   07/03/2016  5:34 PM 07/18/2016  5:11 PM Full Code [id number]  Collene Gobble, MD Inpatient   07/03/2016  5:34 PM 07/03/2016  5:34 PM DNR [id number]  Collene Gobble, MD Inpatient   06/20/2016 10:44 PM 07/03/2016  5:34 PM Full Code [id number]  Corey Harold, NP ED   08/06/2015  5:34 PM 08/06/2015  5:34 PM Full Code [id number]  Cathlyn Parsons, PA-C Inpatient   08/06/2015  5:34 PM 08/24/2015  8:52 PM Full Code [id number]  Cathlyn Parsons, PA-C Inpatient   08/02/2015  1:20 PM 08/06/2015  5:34 PM Full Code [id number]  Jones Bales, MD Inpatient   02/08/2015  5:03 AM 02/11/2015  3:03 AM Full Code [id number]  Lavina Hamman, MD Inpatient   01/19/2014  9:31 PM 01/21/2014 10:37 PM Full Code [id number]  Etta Quill, DO ED       Chief Complaint  Patient presents with  . Acute Visit    Weight gain and bilateral lower extremity edema    HISTORY OF PRESENT ILLNESS:  This is a 66-YO female seen for an acute visit secondary to weight gain and bilateral lower extremity edema.  Latest weight 237.8 lbs, gained 4.8 lbs in 4 days. She is currently on Lasix 10 mg daily. She has BLE  edema 1-2+  She has diagnosis of  Chronic diastolic CHF. She has PMH of Vascular dementia, stroke, diabetes mellitus type 2, hypertension and hyperthyroidism.   PAST MEDICAL HISTORY:  Past Medical History:  Diagnosis Date  . Arthritis   . Asthma    09/07/16  . Diabetes mellitus, type 2 ([place])   . Hyperlipidemia   . Hypertension   . Hyperthyroidism   . Obesity hypoventilation syndrome ([place]) 06/20/2013  . Stroke ([place])   . Vascular dementia      CURRENT MEDICATIONS: Reviewed  Patient's Medications  New Prescriptions   No medications on file  Previous Medications   ACETAMINOPHEN (TYLENOL) 500 MG TABLET    Take 500 mg by mouth every 6 (six) hours as needed for mild pain.    AMLODIPINE (NORVASC) 5 MG TABLET    Take 1 tablet (5 mg total) by mouth daily.   ASPIRIN 81 MG CHEWABLE TABLET    Chew 81 mg by mouth daily.   ATORVASTATIN (LIPITOR) 40 MG TABLET    Take 40 mg by mouth every evening.    B COMPLEX-VITAMIN C-FOLIC ACID (NEPHRO-VITE) 0.8 MG TABS TABLET    Take 1 tablet by mouth every morning.  BISACODYL (DULCOLAX) 10 MG SUPPOSITORY    Place 10 mg rectally daily as needed for moderate constipation.   BUDESONIDE (PULMICORT) 0.5 MG/2ML NEBULIZER SOLUTION    Take 0.5 mg by nebulization 2 (two) times daily.   CHOLECALCIFEROL (VITAMIN D) 1000 UNITS TABLET    Take 1,000 Units by mouth daily.   DOCUSATE SODIUM (COLACE) 100 MG CAPSULE    Take 100 mg by mouth daily.    FAMOTIDINE (PEPCID) 20 MG TABLET    Take 20 mg by mouth daily.    FERROUS SULFATE 325 (65 FE) MG TABLET    Take 325 mg by mouth 2 (two) times daily with a meal.   FUROSEMIDE (LASIX) 20 MG TABLET    Take 20 mg by mouth daily. Take 1/2 tablet to = 10 mg   INSULIN DETEMIR (LEVEMIR) 100 UNIT/ML INJECTION    Inject 10 Units into the skin at bedtime.    IPRATROPIUM-ALBUTEROL (DUONEB) 0.5-2.5 (3) MG/3ML SOLN    Take 3 mLs by nebulization every 4 (four) hours as needed (for dyspnea).   MAGNESIUM HYDROXIDE (MILK OF MAGNESIA) 400 MG/5ML  SUSPENSION    Take 30 mLs by mouth daily as needed for mild constipation.   MELATONIN 3 MG TABS    Take 3 mg by mouth at bedtime.    MEMANTINE (NAMENDA) 10 MG TABLET    Take 10 mg by mouth 2 (two) times daily.    METHIMAZOLE (TAPAZOLE) 10 MG TABLET    Take 10 mg by mouth daily.    METOPROLOL (LOPRESSOR) 50 MG TABLET    Take 50 mg by mouth 2 (two) times daily.   ONDANSETRON (ZOFRAN) 4 MG TABLET    Take 4 mg by mouth every 8 (eight) hours as needed for nausea or vomiting.   SODIUM PHOSPHATES (RA SALINE ENEMA RE)    Place 1 Applicatorful rectally daily as needed (constipation).   TROLAMINE SALICYLATE (ASPERCREME) 10 % CREAM    Apply 1 application topically 2 (two) times daily.   Modified Medications   No medications on file  Discontinued Medications   No medications on file     Allergies  Allergen Reactions  . Penicillins Swelling    FACIAL SWELLING Has patient had a PCN reaction causing immediate rash, facial/tongue/throat swelling, SOB or lightheadedness with hypotension: Yes Has patient had a PCN reaction causing severe rash involving mucus membranes or skin necrosis: No Has patient had a PCN reaction that required hospitalization: No Has patient had a PCN reaction occurring within the last 10 years: No If all of the above answers are "NO", then may proceed with Cephalosporin use.   . Lactose Intolerance (Gi) Diarrhea and Nausea And Vomiting     REVIEW OF SYSTEMS:  Unable to obtain due to dementia; non-verbal    PHYSICAL EXAMINATION  GENERAL APPEARANCE: Well nourished. In no acute distress. Obese SKIN:  Skin is warm and dry.  HEAD: Normal in size and contour. No evidence of trauma EYES: Lids open and close normally. No blepharitis, entropion or ectropion. PERRL. Conjunctivae are clear and sclerae are white. Lenses are without opacity EARS: Pinnae are normal. MOUTH and THROAT: Lips are without lesions. Oral mucosa is moist and without lesions. Tongue is normal in shape, size,  and color and without lesions NECK: supple, trachea midline, no neck masses, no thyroid tenderness, no thyromegaly LYMPHATICS: no LAN in the neck, no supraclavicular LAN RESPIRATORY: breathing is even & unlabored, BS CTAB CARDIAC: RRR, no murmur,no extra heart sounds, BLE 1-2+ edema GI:  abdomen soft, normal BS, no masses, no tenderness, no hepatomegaly, no splenomegaly EXTREMITIES:  able to move BUE, did not move BLE when asked to move  PSYCHIATRIC:  Affect and behavior are appropriate   LABS/RADIOLOGY: Labs reviewed: Basic Metabolic Panel:  Recent Labs  08/23/16 0410  08/28/16 0626 08/30/16 0700 08/31/16 0628  03/20/17 1416 03/21/17 0437 03/22/17 0737  NA 138  < > 133* 133* 135  < > 138 138 135  K 4.3  < > 5.2* 5.3* 4.5  < > 4.5 5.3* 4.4  CL 104  < > 95* 97* 98*  < > 105 104 101  CO2 27  < > 30 30 28   < > 24 25 26   GLUCOSE 150*  < > 174* 154* 202*  < > 211* 151* 215*  BUN 11  < > 11 18 19   < > 36* 41* 45*  CREATININE 0.95  < > 1.02* 1.34* 1.41*  < > 1.58* 1.78* 1.71*  CALCIUM 8.8*  < > 9.0 9.3 9.2  < > 9.3 9.6 9.3  MG 1.9  --  1.3* 2.1  --   --   --   --   --   PHOS  --   --  4.2 5.1* 5.3*  --   --   --   --   < > = values in this interval not displayed. Liver Function Tests:  Recent Labs  07/19/16 0633  08/09/16 2026  08/19/16 0628  08/28/16 0626 08/30/16 0700 08/31/16 0628  AST 23  --  22  --  17  --   --   --   --   ALT 23  --  11*  --  9*  --   --   --   --   ALKPHOS 135*  --  93  --  63  --   --   --   --   BILITOT 0.9  --  0.7  --  0.3  --   --   --   --   PROT 6.0*  --  5.8*  --  4.7*  --   --   --   --   ALBUMIN 1.5*  < > 1.7*  < > 1.3*  < > 1.8* 2.1* 2.1*  < > = values in this interval not displayed.  Recent Labs  06/22/16 0334 06/23/16 0332 06/29/16 1130  AMMONIA 72* 60* 21   CBC:  Recent Labs  02/06/17 2057  03/10/17 03/20/17 03/20/17 1416 03/21/17 0437  WBC 12.5*  < > 8.7 8.3 11.9* 7.9  NEUTROABS 10.4*  < > 6 6 10.1*  --   HGB 9.3*  < >  8.7* 9.1* 9.3* 8.9*  HCT 29.2*  < > 26* 30* 29.6* 29.1*  MCV 86.6  --   --   --  87.3 87.1  PLT 223  < > 194 209 216 217  < > = values in this interval not displayed. Cardiac Enzymes:  Recent Labs  06/20/16 2058 06/21/16 0135 06/21/16 0442 06/21/16 0900  CKTOTAL 34*  --   --   --   TROPONINI  --  0.04* 0.06* 0.06*   CBG:  Recent Labs  03/22/17 2102 03/23/17 0741 03/23/17 1121  GLUCAP 206* 102* 168*      Dg Chest 2 View  Result Date: 03/20/2017 CLINICAL DATA:  Shortness of breath without chest pain. History of asthma, diabetes, and hypertension. Nonsmoker. EXAM: CHEST  2 VIEW COMPARISON:  Chest x-ray  dated Feb 06, 2017 FINDINGS: The lungs are borderline hypoinflated. The interstitial markings are mildly increased. The pulmonary vascularity is engorged. The cardiac silhouette is enlarged. There is no pleural effusion or alveolar infiltrate. The bony thorax exhibits no acute abnormality. There is deviation of the trachea toward the right which is stable. IMPRESSION: CHF with mild pulmonary interstitial edema. No alveolar pneumonia or pleural effusion. Chronic deviation of the trachea toward the right is more conspicuous than on the studies of earlier this year. This may be secondary to a goiter or to other superior mediastinal mass. Thyroid ultrasound would be a useful next imaging step. Electronically Signed   By: David  Martinique M.D.   On: 03/20/2017 12:40   Ct Soft Tissue Neck Wo Contrast  Result Date: 03/21/2017 CLINICAL DATA:  66 y/o  F; question of goiter. EXAM: CT NECK WITHOUT CONTRAST TECHNIQUE: Multidetector CT imaging of the neck was performed following the standard protocol without intravenous contrast. COMPARISON:  None. FINDINGS: Pharynx and larynx: Normal. No mass or swelling. Salivary glands: No inflammation, mass, or stone. Thyroid: Massive thyroid goiter with the right lobe of thyroid measuring 4.8 x 5.0 x 7.6 cm (AP x ML x CC series 3: Image 65 and 6:60) and the left  lobe of thyroid measuring 4.3 x 5.5 x 8.3 cm. There are numerous coarse calcifications throughout the thyroid gland. The thyroid gland exerts mild mass effect on the subglottic airway which is narrowed to 1.7 x 1.0 cm (AP by ML 3:66). Lymph nodes: None enlarged or abnormal density. Vascular: Negative. Limited intracranial: Negative. Visualized orbits: Negative. Mastoids and visualized paranasal sinuses: Clear. Skeleton: Moderate cervical spondylosis with predominantly discogenic degenerative changes greatest at the C3 through C6 levels. No high-grade bony canal stenosis. Upper chest: Negative. Other: Motion artifact at the level of the larynx. IMPRESSION: 1. Massive thyroid goiter. Mild mass effect on the subglottic airway which is narrowed to 1.7 x 1.0 cm. 2. Moderate cervical spondylosis. Electronically Signed   By: Kristine Garbe M.D.   On: 03/21/2017 22:18    ASSESSMENT/PLAN:  1. Chronic diastolic CHF (congestive heart failure) (HCC) - no SOB, will increase Lasix from 10 mg to 20 mg 1 tab PO Q D and  KCL ER 20 meq 1 tab PO Q M_W_F; BMP on 04/09/17   2. CKD (chronic kidney disease) stage 3, GFR 30-59 ml/min - will monitor Lab Results  Component Value Date   CREATININE 1.71 (H) 03/22/2017    3. Lower extremity edema - encourage to elevate BLE @ night, will increase Lasix from 10 mg to 20 mg Q D; weight daily  and notify provider for 3 lbs weight gain.      Alexus Galka C. Avalon - NP    [employer] [phone]

## 2017-04-09 ENCOUNTER — Ambulatory Visit (INDEPENDENT_AMBULATORY_CARE_PROVIDER_SITE_OTHER): Payer: Medicare Other | Admitting: Pulmonary Disease

## 2017-04-09 ENCOUNTER — Ambulatory Visit (INDEPENDENT_AMBULATORY_CARE_PROVIDER_SITE_OTHER)
Admission: RE | Admit: 2017-04-09 | Discharge: 2017-04-09 | Disposition: A | Discharge status: Home / Self Care | Payer: Medicare Other | Source: Ambulatory Visit | Attending: Pulmonary Disease | Admitting: Pulmonary Disease

## 2017-04-09 ENCOUNTER — Non-Acute Institutional Stay (SKILLED_NURSING_FACILITY): Payer: Medicare Other | Admitting: Internal Medicine

## 2017-04-09 ENCOUNTER — Encounter: Payer: Self-pay | Admitting: Pulmonary Disease

## 2017-04-09 ENCOUNTER — Encounter: Payer: Self-pay | Admitting: Internal Medicine

## 2017-04-09 VITALS — BP 112/82 | HR 66 | Wt 233.0 lb

## 2017-04-09 DIAGNOSIS — R0609 Other forms of dyspnea: Secondary | ICD-10-CM | POA: Diagnosis not present

## 2017-04-09 DIAGNOSIS — E049 Nontoxic goiter, unspecified: Secondary | ICD-10-CM

## 2017-04-09 DIAGNOSIS — E040 Nontoxic diffuse goiter: Secondary | ICD-10-CM

## 2017-04-09 DIAGNOSIS — R061 Stridor: Secondary | ICD-10-CM

## 2017-04-09 DIAGNOSIS — D649 Anemia, unspecified: Secondary | ICD-10-CM | POA: Diagnosis not present

## 2017-04-09 DIAGNOSIS — J9611 Chronic respiratory failure with hypoxia: Secondary | ICD-10-CM

## 2017-04-09 DIAGNOSIS — R0602 Shortness of breath: Secondary | ICD-10-CM

## 2017-04-09 DIAGNOSIS — R600 Localized edema: Secondary | ICD-10-CM | POA: Diagnosis not present

## 2017-04-09 DIAGNOSIS — I5032 Chronic diastolic (congestive) heart failure: Secondary | ICD-10-CM | POA: Diagnosis not present

## 2017-04-09 DIAGNOSIS — E662 Morbid (severe) obesity with alveolar hypoventilation: Secondary | ICD-10-CM | POA: Diagnosis not present

## 2017-04-09 DIAGNOSIS — R609 Edema, unspecified: Secondary | ICD-10-CM

## 2017-04-09 LAB — BASIC METABOLIC PANEL
BUN: 37 — AB (ref 4–21)
Creatinine: 2.1 — AB (ref 0.5–1.1)
Glucose: 117
Potassium: 4.9 (ref 3.4–5.3)
Sodium: 141 (ref 137–147)

## 2017-04-09 NOTE — Progress Notes (Signed)
This is a nursing facility follow up for specific acute issue of peripheral edema.  Interim medical record and care since last [place] visit was updated with review of diagnostic studies and change in clinical status since last visit were documented.  HPI: The patient was seen today by her Pulmonologist,Dr Sood, for dyspnea and associated stridor. The differential diagnoses include esophageal stricture post tracheostomy vs esophageal compression due to massive goiter. ENT follow-up with Dr Redmond Baseman is scheduled later. He has discussed with her daughter as to possible interventions. The daughter would prefer repeat tracheostomy rather than thyroidectomy if the stridor and respiratory compromise recurs.  CT of soft tissue neck 03/21/17 reveals a massive thyroid goiter with narrowing of the subglottic airway to 1.7 x 1 cm. Unfortunately patient has a history of self decannulation on at least 5  occasions  prior to its final removal in the outpatient [facility]. Dr Halford Chessman also questioned the role of amlodipine in her peripheral edema. He asked her to discuss this with me. Unfortunately the patient has advanced dementia and would not comprehend these recommendations. He recommended continuing the 2 L of oxygen for her obesity hypoventilation syndrome. She's not on CPAP.  Review of systems: Dementia invalidated responses. She was unable to give the date. She gave Tawni Pummel as name of the president. She did realize she had gone to the doctor today but could not give me his name. She was unsure what he recommended although she stated she "might have to have something done to (my) neck". She was unclear as to what the nature of the problem was  Constitutional: No fever,significant weight change, fatigue  Cardiovascular: No chest pain, palpitations,paroxysmal nocturnal dyspnea, claudication, edema  Respiratory: No cough, sputum production,hemoptysis, DOE , significant snoring,apnea  Gastrointestinal:  No heartburn,dysphagia,abdominal pain, nausea / vomiting,rectal bleeding, melena,change in bowels Genitourinary: No dysuria,hematuria, pyuria,  incontinence, nocturia  Physical exam:  Pertinent or positive findings: She is markedly hoarse. She exhibits repeated pursing of the lips. This is almost a chewing motion. She has extremely large asymmetric goiter. Scattered asymmetric rhonchi, greater on the right than the left. Heart sounds are markedly distant. Abdomen is protuberant. 1+ pitting edema. Expresses tenderness with the deep palpation of edema. Pedal pulses are decreased  General appearance:Adequately nourished; no acute distress , increased work of breathing is present.   Lymphatic: No lymphadenopathy about the head, neck, axilla . Eyes: No conjunctival inflammation or lid edema is present. There is no scleral icterus. Ears:  External ear exam shows no significant lesions or deformities.   Nose:  External nasal examination shows no deformity or inflammation. Nasal mucosa are pink and moist without lesions ,exudates Oral exam: lips and gums are healthy appearing.There is no oropharyngeal erythema or exudate . Neck:  No thyromegaly, masses, tenderness noted.    Heart:  Normal rate and regular rhythm. S1 and S2 normal without gallop, murmur, click, rub .  Abdomen:Bowel sounds are normal. Abdomen is soft and nontender with no organomegaly, hernias,masses. GU: deferred  Extremities:  No cyanosis, clubbing  Skin: Warm & dry w/o tenting. No significant lesions or rash.  See summary under each active problem in the Problem List with associated updated therapeutic plan

## 2017-04-09 NOTE — Patient Instructions (Signed)
Chest xray today  Discuss with your primary care doctor about whether amlodipine (blood pressure medicine) could be contributing to your leg swelling  Follow up with Dr. Redmond Baseman with ENT to discuss whether your goiter is contributing to your breathing troubles  Follow up in 6 months

## 2017-04-09 NOTE — Addendum Note (Signed)
Addended by: Virl Cagey on: 04/09/2017 10:53 AM   Modules accepted: Orders

## 2017-04-09 NOTE — Progress Notes (Signed)
Current Outpatient Prescriptions on File Prior to Visit  Medication Sig  . acetaminophen (TYLENOL) 500 MG tablet Take 500 mg by mouth every 6 (six) hours as needed for mild pain.   Marland Kitchen amLODipine (NORVASC) 5 MG tablet Take 1 tablet (5 mg total) by mouth daily.  Marland Kitchen aspirin 81 MG chewable tablet Chew 81 mg by mouth daily.  Marland Kitchen atorvastatin (LIPITOR) 40 MG tablet Take 40 mg by mouth every evening.   Marland Kitchen b complex-vitamin c-folic acid (NEPHRO-VITE) 0.8 MG TABS tablet Take 1 tablet by mouth every morning.  . bisacodyl (DULCOLAX) 10 MG suppository Place 10 mg rectally daily as needed for moderate constipation.  . budesonide (PULMICORT) 0.5 MG/2ML nebulizer solution Take 0.5 mg by nebulization 2 (two) times daily.  . cholecalciferol (VITAMIN D) 1000 units tablet Take 1,000 Units by mouth daily.  Marland Kitchen docusate sodium (COLACE) 100 MG capsule Take 100 mg by mouth daily.   . famotidine (PEPCID) 20 MG tablet Take 20 mg by mouth daily.   . ferrous sulfate 325 (65 FE) MG tablet Take 325 mg by mouth 2 (two) times daily with a meal.  . furosemide (LASIX) 20 MG tablet Take 20 mg by mouth daily. Take 1/2 tablet to = 10 mg  . insulin detemir (LEVEMIR) 100 UNIT/ML injection Inject 10 Units into the skin at bedtime.   Marland Kitchen ipratropium-albuterol (DUONEB) 0.5-2.5 (3) MG/3ML SOLN Take 3 mLs by nebulization every 4 (four) hours as needed (for dyspnea).  . magnesium hydroxide (MILK OF MAGNESIA) 400 MG/5ML suspension Take 30 mLs by mouth daily as needed for mild constipation.  . Melatonin 3 MG TABS Take 3 mg by mouth at bedtime.   . memantine (NAMENDA) 10 MG tablet Take 10 mg by mouth 2 (two) times daily.   . methimazole (TAPAZOLE) 10 MG tablet Take 10 mg by mouth daily.   . metoprolol (LOPRESSOR) 50 MG tablet Take 50 mg by mouth 2 (two) times daily.  . ondansetron (ZOFRAN) 4 MG tablet Take 4 mg by mouth every 8 (eight) hours as needed for nausea or vomiting.  . Sodium Phosphates (RA SALINE ENEMA RE) Place 1 Applicatorful rectally  daily as needed (constipation).  . trolamine salicylate (ASPERCREME) 10 % cream Apply 1 application topically 2 (two) times daily.    No current facility-administered medications on file prior to visit.     Chief Complaint  Patient presents with  . Follow-up    Denies any breathing issues. Recent hospitalization for SOB-- family wants the patient's lungs checked for fluid. Pt has increased bilateral edema.     Sleep tests PSG 07/20/13 >> AHI 0.6, SpO2 low 88%, Spent 6.6 min with SpO2 < 90%, PLMI 58.5, decreased sleep time ONO with RA 07/30/13 >> Test time 9 hrs 47 min. Baseline SpO2 94%, low SpO2 68%. Spent 1 hr 6 min with SpO2 < 89%. ONO with RA 05/06/15 >> Test time 7 hrs 26 min.  Mean SpO2 77%, low SpO2 59%.  Spent 5 hrs 48 min with SpO2 < 88%. ONO with RA 05/27/16 >> test time 11 hrs 39 min.  Baseline SpO2 95.7%, low SpO2 80%.  Spent 6 min 20 sec with SpO2 < 88%.  Pulmonary tests CT soft tissue neck 03/21/17 >> massive thyroid goiter, narrowing of subglottic airway to 1.7 x 1 cm  Cardiac tests Echo 06/21/16 >> EF 60 to 65%, grade 1 DD, mild AS, mild MR  Past medical history HTN, CVA, DM, HLD, Hypothyroidism, Vascular dementia  Past surgical history, Family history,  Social history, Allergies reviewed.  Vital signs BP 112/82 (BP Location: Right Arm, Cuff Size: Normal)   Pulse 66   Wt 233 lb (105.7 kg)   SpO2 100%   BMI 37.61 kg/m   History of Present Illness: Angelica Beck is a 66 y.o. female with obesity hypoventilation syndrome using 2 liters oxygen.  She was in hospital in June for dyspnea and stridor.  Concern was for CHF versus goiter.  She was tx with lasix with some improvement.  She still feels short of breath.  She denies cough, wheeze, chest pain, difficulty swallowing.    She has persistent ankle edema.    She uses 2 liters oxygen at night.  She was seen by Dr. Redmond Baseman in hospital from ENT.  She has follow up scheduled with him.  She was not able to  perform spirometry test maneuver today.   Physical Exam:  General - pleasant, sitting in wheelchair Eyes - pupils reactive ENT - no sinus tenderness, no oral exudate, no LAN, old trach scar, large goiter, edentulous Cardiac - regular, no murmur Chest - no wheeze, rales Abd - soft, non tender Ext - 1+ ankle edema Skin - no rashes Neuro - normal strength Psych - normal mood   Assessment/Plan:  Dyspnea with stridor. - CHF/pulmonary edema seem less likely at this point >> will repeat CXR - concern this is more related to goiter and airway narrowing >> she has follow up with ENT  Peripheral edema. - advised her to d/w PCP about whether this could be related to amlodipine  Obesity hypoventilation syndrome. - continue 2 liters oxygen at night  Time spent 26 minutes  Patient Instructions  Chest xray today  Discuss with your primary care doctor about whether amlodipine (blood pressure medicine) could be contributing to your leg swelling  Follow up with Dr. Redmond Baseman with ENT to discuss whether your goiter is contributing to your breathing troubles  Follow up in 6 months   Chesley Mires, MD [facility] Pager:  [phone] 04/09/2017, 10:51 AM

## 2017-04-09 NOTE — Assessment & Plan Note (Signed)
Dr Halford Chessman questions role of amlodipine in the edema It will be discontinued and blood pressure monitored

## 2017-04-10 ENCOUNTER — Telehealth: Payer: Self-pay | Admitting: Pulmonary Disease

## 2017-04-10 NOTE — Telephone Encounter (Signed)
Dg Chest 2 View  Result Date: 04/09/2017 CLINICAL DATA:  Short of breath.  Chronic respiratory failure EXAM: CHEST  2 VIEW COMPARISON:  03/20/2017 FINDINGS: Interval improvement in vascular congestion. No edema or effusion. Mild bibasilar atelectasis improved. Heart size upper normal IMPRESSION: Interval improvement in congestive heart failure Mild bibasilar atelectasis also improved Electronically Signed   By: Franchot Gallo M.D.   On: 04/09/2017 11:56    Will have my nurse inform pt that CXR looks improved compared to CXR from hospital.  She doesn't have fluid in her lungs anymore, and therefore this isn't the cause of her continued shortness of breath.  She should follow up with appointment with ENT to discuss whether her goiter is narrowing her airway and causing her shortness of breath.

## 2017-04-11 ENCOUNTER — Encounter: Payer: Self-pay | Admitting: Internal Medicine

## 2017-04-11 NOTE — Assessment & Plan Note (Addendum)
TSH 3.72 in 5/18; no current Free T4 & Free T3 If tracheostomy is repeated, my fear is that she will continue to remove the trach as she cannot comprehend its indication or risk of airway compromise in its absence because of her advanced dementia I shall share my concerns with Dr. Redmond Baseman, ENT

## 2017-04-11 NOTE — Patient Instructions (Signed)
See assessment and plan under each diagnosis in the problem list and acutely for this visit 

## 2017-04-12 DIAGNOSIS — I10 Essential (primary) hypertension: Secondary | ICD-10-CM | POA: Diagnosis not present

## 2017-04-12 DIAGNOSIS — E039 Hypothyroidism, unspecified: Secondary | ICD-10-CM | POA: Diagnosis not present

## 2017-04-12 DIAGNOSIS — E0590 Thyrotoxicosis, unspecified without thyrotoxic crisis or storm: Secondary | ICD-10-CM | POA: Diagnosis not present

## 2017-04-12 LAB — TSH: TSH: 1.83 (ref 0.41–5.90)

## 2017-04-13 NOTE — Telephone Encounter (Signed)
Attempted to contact pt. Call would not go through. Will try back. 

## 2017-04-16 ENCOUNTER — Non-Acute Institutional Stay (SKILLED_NURSING_FACILITY): Payer: Medicare Other | Admitting: Adult Health

## 2017-04-16 ENCOUNTER — Encounter: Payer: Self-pay | Admitting: Adult Health

## 2017-04-16 DIAGNOSIS — L0292 Furuncle, unspecified: Secondary | ICD-10-CM

## 2017-04-16 DIAGNOSIS — E0590 Thyrotoxicosis, unspecified without thyrotoxic crisis or storm: Secondary | ICD-10-CM

## 2017-04-16 DIAGNOSIS — E049 Nontoxic goiter, unspecified: Secondary | ICD-10-CM

## 2017-04-16 DIAGNOSIS — E040 Nontoxic diffuse goiter: Secondary | ICD-10-CM

## 2017-04-16 NOTE — Progress Notes (Signed)
DATE:  04/16/2017   MRN:  [id number]  BIRTHDAY: [birth year]  Facility:  Nursing Home Location:  [facility] Room Number: 218-B  LEVEL OF CARE:  SNF (31)  Contact Information    Name Relation Home Work Mobile   Tisdale,[name] Daughter   [phone]       Code Status History    Date Active Date Inactive Code Status Order ID Comments User Context   03/20/2017  5:57 PM 03/23/2017  4:55 PM Full Code [id number]  Waldemar Dickens, MD ED   12/21/2016  7:04 PM 12/24/2016  4:42 PM Full Code [id number]  Elmarie Shiley, MD Inpatient   08/19/2016  1:30 AM 09/06/2016 11:12 PM Full Code [id number]  Esmond Camper Inpatient   08/10/2016  8:32 PM 08/18/2016  7:29 PM Full Code [id number]  Mayo, Pete Pelt, MD Inpatient   07/18/2016  8:34 PM 08/10/2016  3:09 PM Full Code [id number]  Gordan Payment, CNA Inpatient   07/03/2016  5:34 PM 07/18/2016  5:11 PM Full Code [id number]  Collene Gobble, MD Inpatient   07/03/2016  5:34 PM 07/03/2016  5:34 PM DNR [id number]  Collene Gobble, MD Inpatient   06/20/2016 10:44 PM 07/03/2016  5:34 PM Full Code [id number]  Corey Harold, NP ED   08/06/2015  5:34 PM 08/06/2015  5:34 PM Full Code [id number]  Cathlyn Parsons, PA-C Inpatient   08/06/2015  5:34 PM 08/24/2015  8:52 PM Full Code [id number]  Cathlyn Parsons, PA-C Inpatient   08/02/2015  1:20 PM 08/06/2015  5:34 PM Full Code [id number]  Jones Bales, MD Inpatient   02/08/2015  5:03 AM 02/11/2015  3:03 AM Full Code [id number]  Lavina Hamman, MD Inpatient   01/19/2014  9:31 PM 01/21/2014 10:37 PM Full Code [id number]  Etta Quill, DO ED       Chief Complaint  Patient presents with  . Acute Visit    Boil between breasts, skin tear between thighs    HISTORY OF PRESENT ILLNESS:  This is a 66-YO female seen for an acute visit due to a boil on her right medial breast. She was seen today with charge nurse beside her. Noted to have a small erythematous and tender nodule on right medial breast.  No opening noted on the nodule.  She has a massive thyroid goiter with latest tsh 1.83, free T4  1.3  and free T3  3.3  .  She is a short-term care resident at [facility]. She has a PMH of vascular dementia, stroke, diabetes mellitus type 2, hypertension and hyperthyroidism.    PAST MEDICAL HISTORY:  Past Medical History:  Diagnosis Date  . Arthritis   . Asthma    09/07/16  . Diabetes mellitus, type 2 ([place])   . Hyperlipidemia   . Hypertension   . Hyperthyroidism   . Obesity hypoventilation syndrome ([place]) 06/20/2013  . Stroke ([place])   . Vascular dementia      CURRENT MEDICATIONS: Reviewed  Patient's Medications  New Prescriptions   No medications on file  Previous Medications   ACETAMINOPHEN (TYLENOL) 500 MG TABLET    Take 500 mg by mouth every 6 (six) hours as needed for mild pain.    ASPIRIN 81 MG CHEWABLE TABLET    Chew 81 mg by mouth daily.   ATORVASTATIN (LIPITOR) 40 MG TABLET    Take 40 mg by mouth every evening.    B COMPLEX-VITAMIN C-FOLIC ACID (NEPHRO-VITE) 0.8 MG  TABS TABLET    Take 1 tablet by mouth every morning.   BISACODYL (DULCOLAX) 10 MG SUPPOSITORY    Place 10 mg rectally daily as needed for moderate constipation.   BUDESONIDE (PULMICORT) 0.5 MG/2ML NEBULIZER SOLUTION    Take 0.5 mg by nebulization 2 (two) times daily.   CHOLECALCIFEROL (VITAMIN D) 1000 UNITS TABLET    Take 1,000 Units by mouth daily.   DOCUSATE SODIUM (COLACE) 100 MG CAPSULE    Take 100 mg by mouth daily.    FAMOTIDINE (PEPCID) 20 MG TABLET    Take 20 mg by mouth daily.    FERROUS SULFATE 325 (65 FE) MG TABLET    Take 325 mg by mouth 2 (two) times daily with a meal.   FUROSEMIDE (LASIX) 20 MG TABLET    Take 20 mg by mouth daily.   INSULIN DETEMIR (LEVEMIR) 100 UNIT/ML INJECTION    Inject 10 Units into the skin at bedtime.    IPRATROPIUM-ALBUTEROL (DUONEB) 0.5-2.5 (3) MG/3ML SOLN    Take 3 mLs by nebulization every 4 (four) hours as needed (for dyspnea).   MAGNESIUM  HYDROXIDE (MILK OF MAGNESIA) 400 MG/5ML SUSPENSION    Take 30 mLs by mouth daily as needed for mild constipation.   MELATONIN 3 MG TABS    Take 3 mg by mouth at bedtime.    MEMANTINE (NAMENDA) 10 MG TABLET    Take 10 mg by mouth 2 (two) times daily.    METHIMAZOLE (TAPAZOLE) 10 MG TABLET    Take 10 mg by mouth daily.    METOPROLOL (LOPRESSOR) 50 MG TABLET    Take 50 mg by mouth 2 (two) times daily.   ONDANSETRON (ZOFRAN) 4 MG TABLET    Take 4 mg by mouth every 8 (eight) hours as needed for nausea or vomiting.   POTASSIUM CHLORIDE SA (K-DUR,KLOR-CON) 20 MEQ TABLET    Take 20 mEq by mouth every Monday, Wednesday, and Friday.   SODIUM PHOSPHATES (RA SALINE ENEMA RE)    Place 1 Applicatorful rectally daily as needed (constipation).   TROLAMINE SALICYLATE (ASPERCREME) 10 % CREAM    Apply 1 application topically 2 (two) times daily.   Modified Medications   No medications on file  Discontinued Medications   No medications on file     Allergies  Allergen Reactions  . Penicillins Swelling    FACIAL SWELLING Has patient had a PCN reaction causing immediate rash, facial/tongue/throat swelling, SOB or lightheadedness with hypotension: Yes Has patient had a PCN reaction causing severe rash involving mucus membranes or skin necrosis: No Has patient had a PCN reaction that required hospitalization: No Has patient had a PCN reaction occurring within the last 10 years: No If all of the above answers are "NO", then may proceed with Cephalosporin use.   . Lactose Intolerance (Gi) Diarrhea and Nausea And Vomiting     REVIEW OF SYSTEMS:  Unable to obtain due to dementia    PHYSICAL EXAMINATION  GENERAL APPEARANCE: Well nourished. In no acute distress. Obese SKIN:  Erythematous nodule on right medial breast HEAD: Normal in size and contour. No evidence of trauma EYES: Lids open and close normally. No blepharitis, entropion or ectropion. PERRL. Conjunctivae are clear and sclerae are white. Lenses  are without opacity EARS: Pinnae are normal. Patient hears normal voice tunes of the examiner MOUTH and THROAT: Lips are without lesions. Oral mucosa is moist and without lesions. Tongue is normal in shape, size, and color and without lesions NECK: +goiter LYMPHATICS: no  LAN in the neck, no supraclavicular LAN RESPIRATORY: breathing is even & unlabored, BS CTAB CARDIAC: RRR, no murmur,no extra heart sounds, no edema GI: abdomen soft, normal BS, no masses, no tenderness, no hepatomegaly, no splenomegaly EXTREMITIES:  Able to move BUE and LLE, did not move RLE PSYCHIATRIC: Alert to self, disoriented to time and place. Affect and behavior are appropriate   LABS/RADIOLOGY: Labs reviewed: Basic Metabolic Panel:  Recent Labs  08/23/16 0410  08/28/16 0626 08/30/16 0700 08/31/16 0628  03/20/17 1416 03/21/17 0437 03/22/17 0737 04/09/17  NA 138  < > 133* 133* 135  < > 138 138 135 141  K 4.3  < > 5.2* 5.3* 4.5  < > 4.5 5.3* 4.4 4.9  CL 104  < > 95* 97* 98*  < > 105 104 101  --   CO2 27  < > 30 30 28   < > 24 25 26   --   GLUCOSE 150*  < > 174* 154* 202*  < > 211* 151* 215*  --   BUN 11  < > 11 18 19   < > 36* 41* 45* 37*  CREATININE 0.95  < > 1.02* 1.34* 1.41*  < > 1.58* 1.78* 1.71* 2.1*  CALCIUM 8.8*  < > 9.0 9.3 9.2  < > 9.3 9.6 9.3  --   MG 1.9  --  1.3* 2.1  --   --   --   --   --   --   PHOS  --   --  4.2 5.1* 5.3*  --   --   --   --   --   < > = values in this interval not displayed. Liver Function Tests:  Recent Labs  07/19/16 0633  08/09/16 2026  08/19/16 0628  08/28/16 0626 08/30/16 0700 08/31/16 0628  AST 23  --  22  --  17  --   --   --   --   ALT 23  --  11*  --  9*  --   --   --   --   ALKPHOS 135*  --  93  --  63  --   --   --   --   BILITOT 0.9  --  0.7  --  0.3  --   --   --   --   PROT 6.0*  --  5.8*  --  4.7*  --   --   --   --   ALBUMIN 1.5*  < > 1.7*  < > 1.3*  < > 1.8* 2.1* 2.1*  < > = values in this interval not displayed.  Recent Labs  06/22/16 0334  06/23/16 0332 06/29/16 1130  AMMONIA 72* 60* 21   CBC:  Recent Labs  02/06/17 2057  03/10/17 03/20/17 03/20/17 1416 03/21/17 0437  WBC 12.5*  < > 8.7 8.3 11.9* 7.9  NEUTROABS 10.4*  < > 6 6 10.1*  --   HGB 9.3*  < > 8.7* 9.1* 9.3* 8.9*  HCT 29.2*  < > 26* 30* 29.6* 29.1*  MCV 86.6  --   --   --  87.3 87.1  PLT 223  < > 194 209 216 217  < > = values in this interval not displayed. Cardiac Enzymes:  Recent Labs  06/20/16 2058 06/21/16 0135 06/21/16 0442 06/21/16 0900  CKTOTAL 34*  --   --   --   TROPONINI  --  0.04* 0.06* 0.06*   CBG:  Recent Labs  03/22/17 2102 03/23/17 0741 03/23/17 1121  GLUCAP 206* 102* 168*      Dg Chest 2 View  Result Date: 04/09/2017 CLINICAL DATA:  Short of breath.  Chronic respiratory failure EXAM: CHEST  2 VIEW COMPARISON:  03/20/2017 FINDINGS: Interval improvement in vascular congestion. No edema or effusion. Mild bibasilar atelectasis improved. Heart size upper normal IMPRESSION: Interval improvement in congestive heart failure Mild bibasilar atelectasis also improved Electronically Signed   By: Franchot Gallo M.D.   On: 04/09/2017 11:56   Dg Chest 2 View  Result Date: 03/20/2017 CLINICAL DATA:  Shortness of breath without chest pain. History of asthma, diabetes, and hypertension. Nonsmoker. EXAM: CHEST  2 VIEW COMPARISON:  Chest x-ray dated Feb 06, 2017 FINDINGS: The lungs are borderline hypoinflated. The interstitial markings are mildly increased. The pulmonary vascularity is engorged. The cardiac silhouette is enlarged. There is no pleural effusion or alveolar infiltrate. The bony thorax exhibits no acute abnormality. There is deviation of the trachea toward the right which is stable. IMPRESSION: CHF with mild pulmonary interstitial edema. No alveolar pneumonia or pleural effusion. Chronic deviation of the trachea toward the right is more conspicuous than on the studies of earlier this year. This may be secondary to a goiter or to other  superior mediastinal mass. Thyroid ultrasound would be a useful next imaging step. Electronically Signed   By: David  Martinique M.D.   On: 03/20/2017 12:40   Ct Soft Tissue Neck Wo Contrast  Result Date: 03/21/2017 CLINICAL DATA:  66 y/o  F; question of goiter. EXAM: CT NECK WITHOUT CONTRAST TECHNIQUE: Multidetector CT imaging of the neck was performed following the standard protocol without intravenous contrast. COMPARISON:  None. FINDINGS: Pharynx and larynx: Normal. No mass or swelling. Salivary glands: No inflammation, mass, or stone. Thyroid: Massive thyroid goiter with the right lobe of thyroid measuring 4.8 x 5.0 x 7.6 cm (AP x ML x CC series 3: Image 65 and 6:60) and the left lobe of thyroid measuring 4.3 x 5.5 x 8.3 cm. There are numerous coarse calcifications throughout the thyroid gland. The thyroid gland exerts mild mass effect on the subglottic airway which is narrowed to 1.7 x 1.0 cm (AP by ML 3:66). Lymph nodes: None enlarged or abnormal density. Vascular: Negative. Limited intracranial: Negative. Visualized orbits: Negative. Mastoids and visualized paranasal sinuses: Clear. Skeleton: Moderate cervical spondylosis with predominantly discogenic degenerative changes greatest at the C3 through C6 levels. No high-grade bony canal stenosis. Upper chest: Negative. Other: Motion artifact at the level of the larynx. IMPRESSION: 1. Massive thyroid goiter. Mild mass effect on the subglottic airway which is narrowed to 1.7 x 1.0 cm. 2. Moderate cervical spondylosis. Electronically Signed   By: Kristine Garbe M.D.   On: 03/21/2017 22:18    ASSESSMENT/PLAN:  1. Furuncle - start Bactroban 2% ointment to right medial breast furuncle topically BID X 10 days, keep skin clean and dry   2. Goiter diffuse - endocrinology consult for possible thyroidectomy   3. Hyperthyroidism - tsh 1.83, free T4  1.3 and free T3  3.3, continue Methimazole 10 mg 1 tab PO Q D     Monina C. Bratenahl - NP      [employer] [phone]

## 2017-04-16 NOTE — Telephone Encounter (Signed)
Spoke with pt's daughter [name] (dpr on file), aware of results/recs.  Nothing further needed at this time.

## 2017-04-16 NOTE — Telephone Encounter (Signed)
LM x 1 

## 2017-04-17 ENCOUNTER — Encounter: Payer: Self-pay | Admitting: Internal Medicine

## 2017-04-18 IMAGING — CR DG CHEST 1V PORT
1 series · 1 of 1 positions shown · non-contrast
Comparison: 07/24/2016

CLINICAL DATA: Respiratory failure and shortness of breath.

EXAM:
PORTABLE CHEST 1 VIEW

[AP]
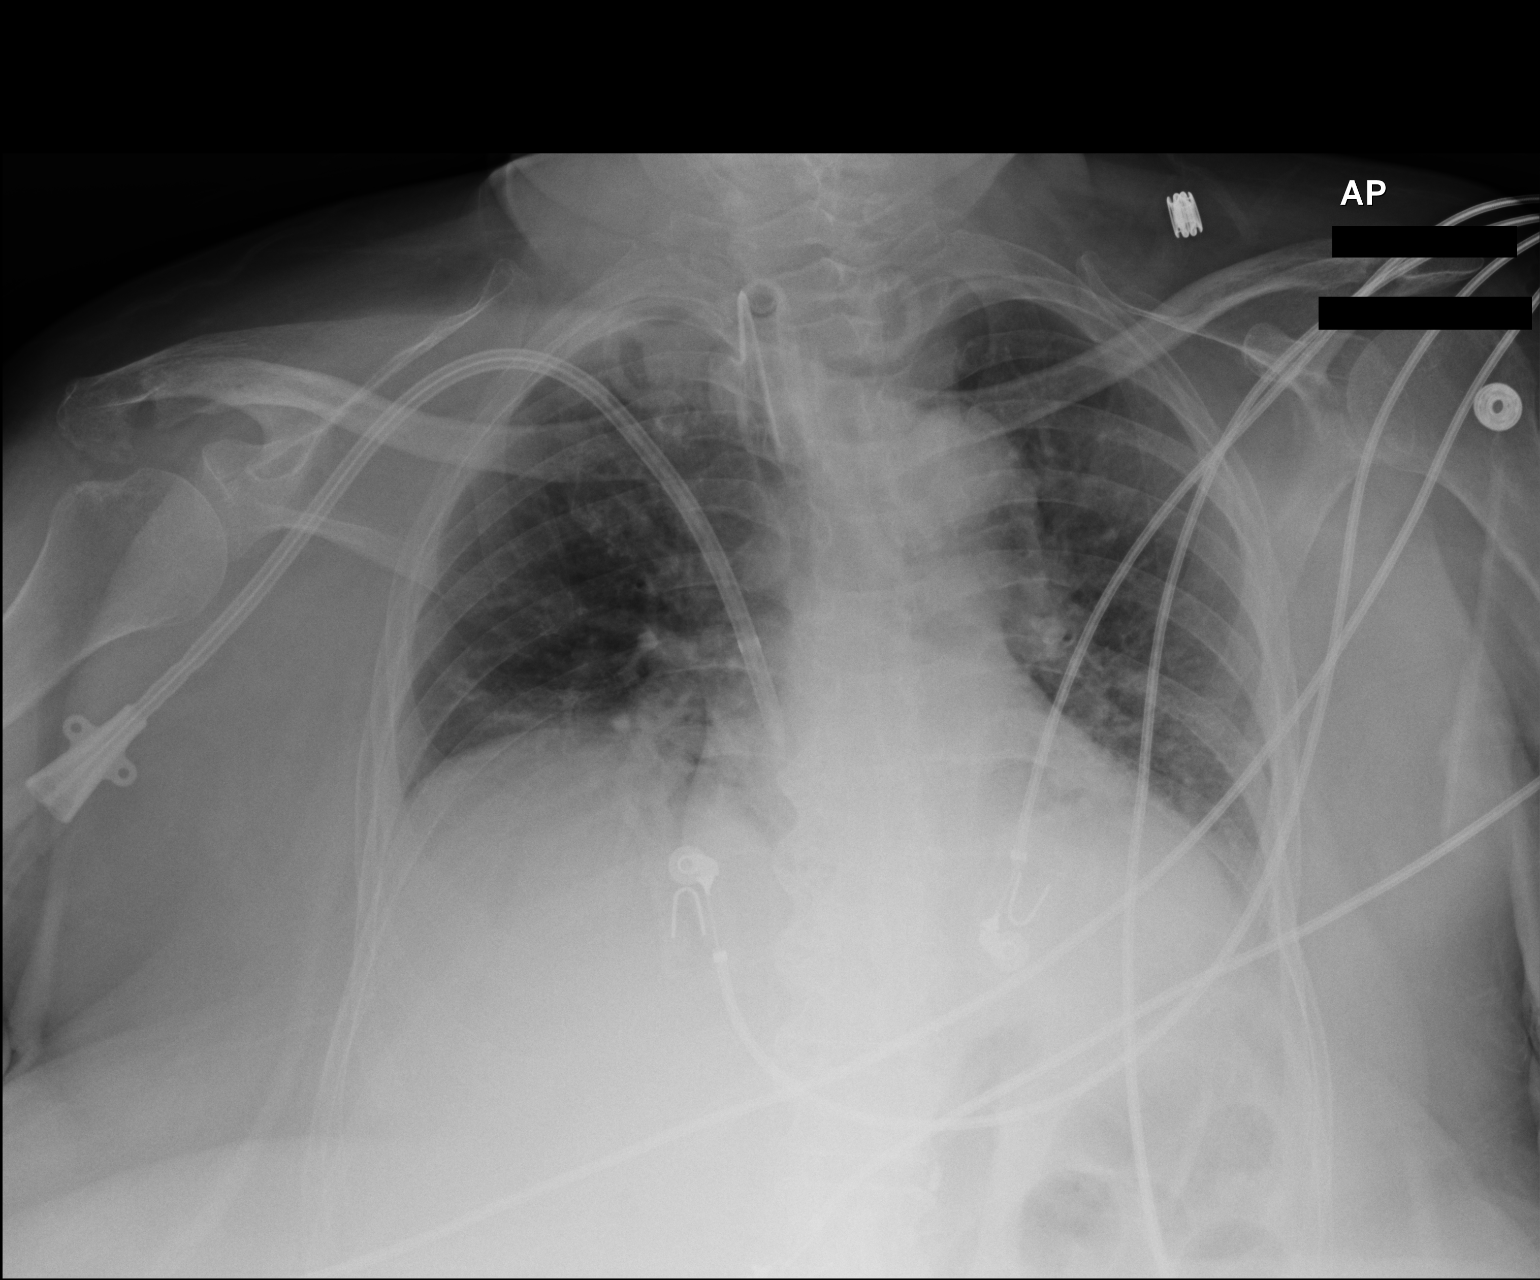

[1 of 1 positions shown; findings below may reference images not displayed]

FINDINGS: Dialysis catheter and tracheostomy shows stable positioning. Left
subclavian central line is been removed. Lungs show decreased volume
on the right with more prominent right basilar atelectasis.
Relatively stable left basilar atelectasis. No overt edema or
pneumothorax.
IMPRESSION: Increase in right basilar atelectasis since the prior study. Stable
left basilar atelectasis.

## 2017-04-21 IMAGING — CR DG CHEST 1V PORT
1 series · 1 of 1 positions shown · non-contrast
Comparison: Chest radiographs 08/06/2016

CLINICAL DATA: PICC placement.

EXAM:
PORTABLE CHEST 1 VIEW

[AP]
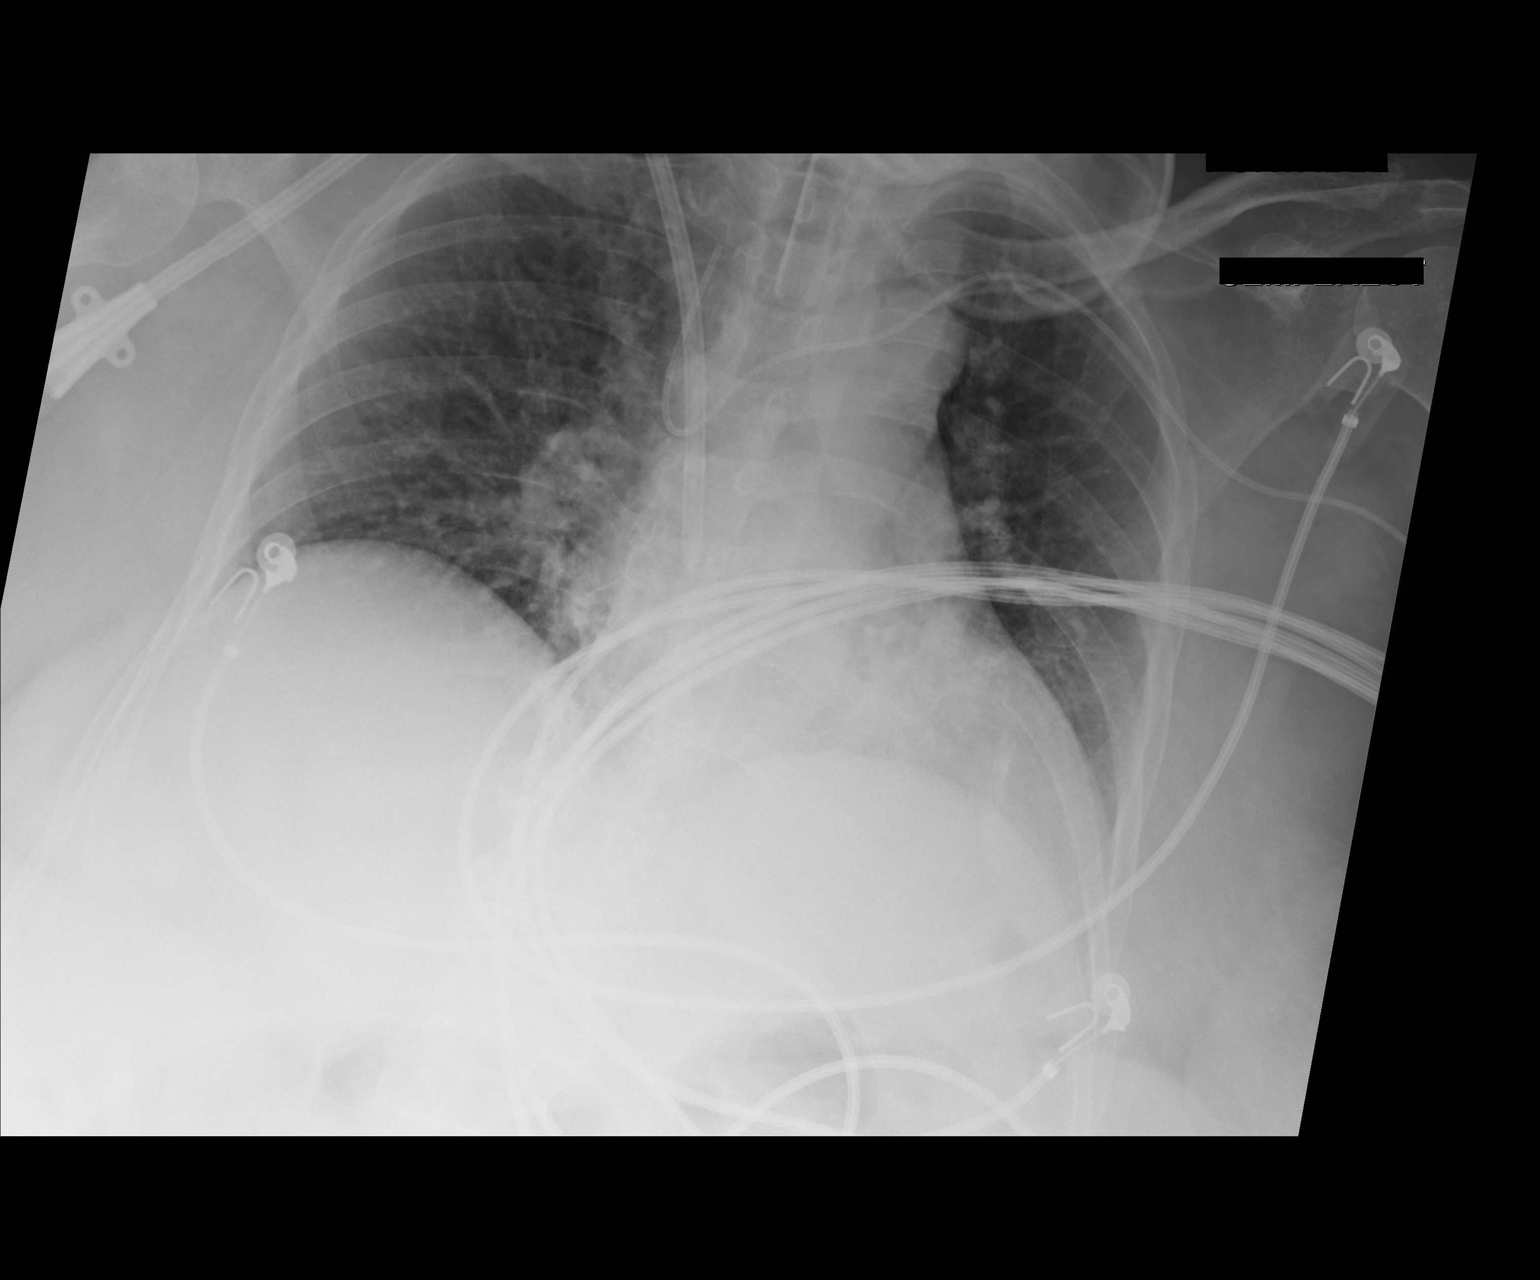

[1 of 1 positions shown; findings below may reference images not displayed]

FINDINGS: Left upper extremity PICC follows the course of the axillary and
brachiocephalic vein, however the tip is directed cranially at the
level of the clavicle in the region of the right internal jugular
vein. Right-sided dialysis catheter remains in place. Tracheostomy
tube remains at the thoracic inlet. Mildly improved lung aeration
with decreasing bibasilar atelectasis. No new focal abnormality.
Mediastinal contours are unchanged.
IMPRESSION: 1. Abnormal positioning of left upper extremity PICC, with tip
crossing the midline and directed cranially in the region of the
right internal jugular vein. Recommend repositioning/removal.
2. Improved lung aeration with decreasing bibasilar atelectasis.
These results will be called to the ordering clinician or
representative by the Radiologist Assistant, and communication
documented in the PACS or zVision Dashboard.

## 2017-04-22 IMAGING — DX DG ABD PORTABLE 1V
1 series · 1 of 1 positions shown · non-contrast
Comparison: 08/09/2016.  08/06/2016.

CLINICAL DATA: Small bowel obstruction.

EXAM:
PORTABLE ABDOMEN - 1 VIEW

[abdomen supine]
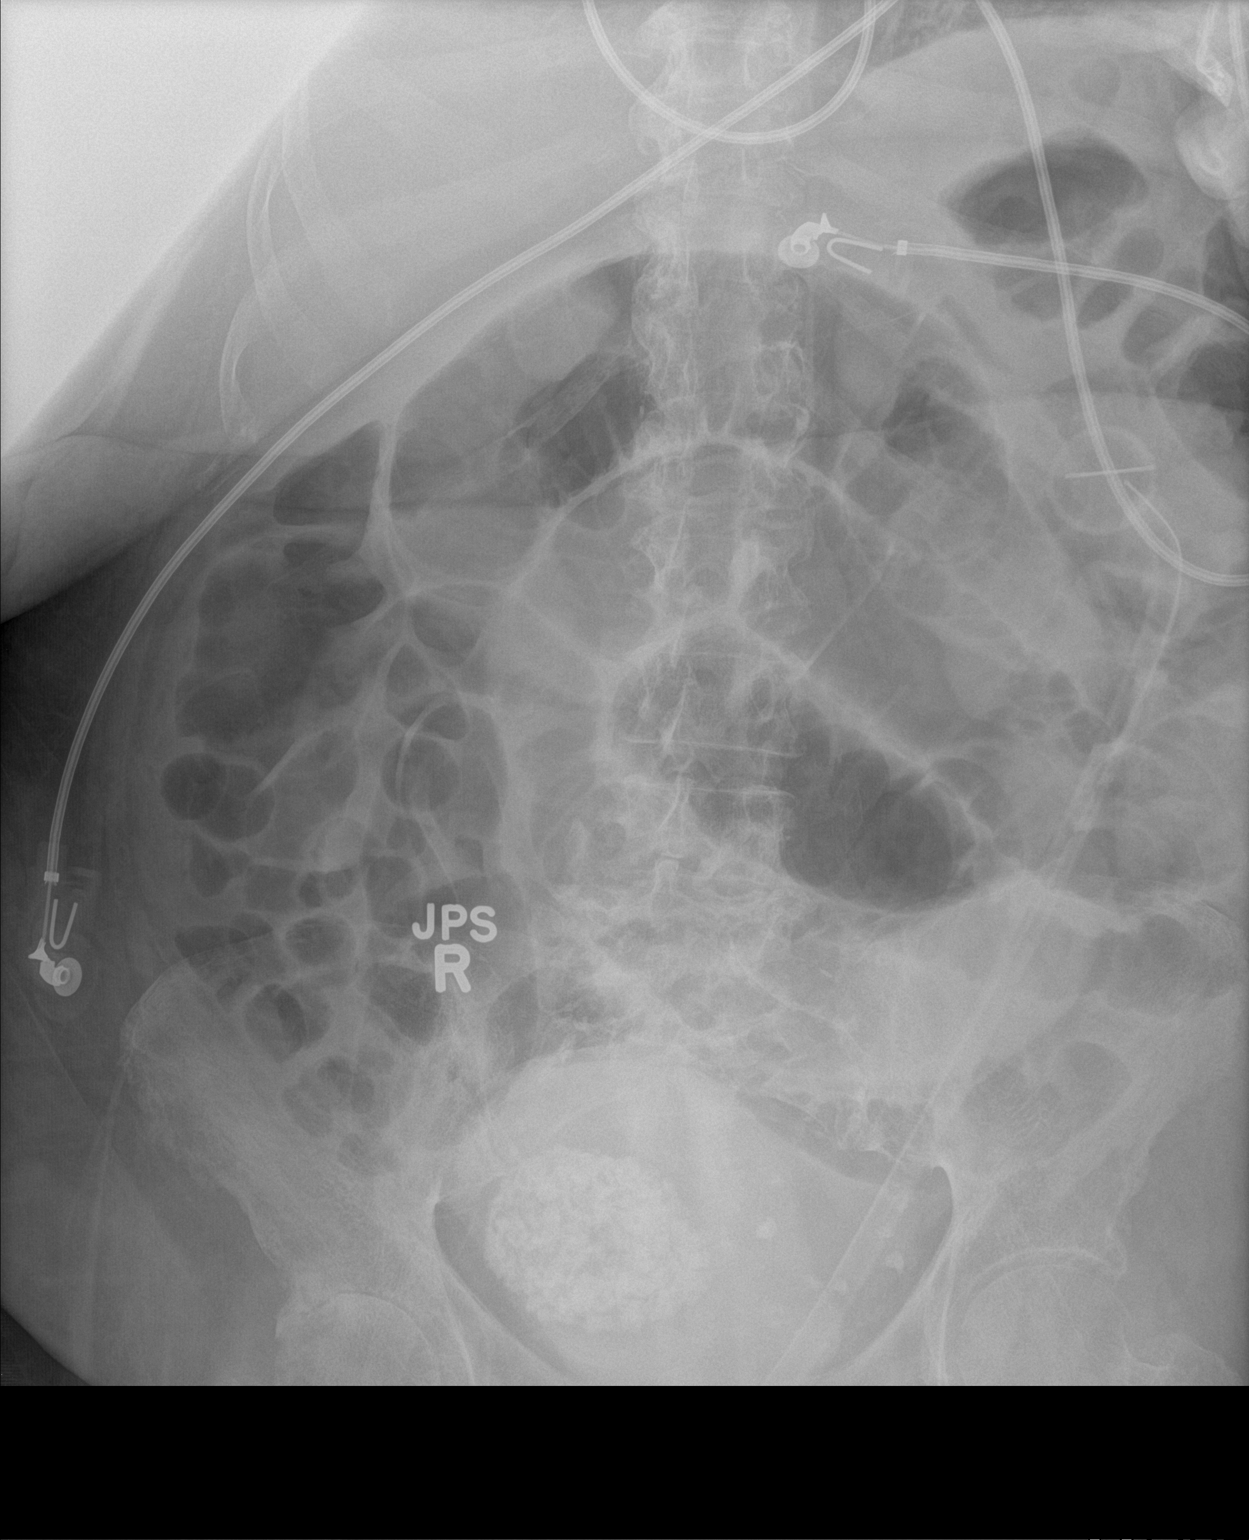

[1 of 1 positions shown; findings below may reference images not displayed]

FINDINGS: 4451 hours. Supine view the abdomen shows interval progression of
gaseous small bowel distention, most noticeable when comparing to
08/06/2016. Small bowel diameters up to 5.3 cm. Gastrostomy tube
overlies the left upper quadrant of the abdomen. Coarse
calcification over the central anatomic pelvis compatible with
fibroid.
IMPRESSION: Continued progression of gaseous small bowel dilatation consistent
with small bowel obstruction.

## 2017-04-24 ENCOUNTER — Telehealth: Payer: Self-pay | Admitting: Internal Medicine

## 2017-04-24 NOTE — Telephone Encounter (Signed)
Nursing Secretary, Neoma Laming calling to check on staus of referral. Please call back.   Ty, -LL

## 2017-04-26 ENCOUNTER — Non-Acute Institutional Stay (SKILLED_NURSING_FACILITY): Payer: Medicare Other | Admitting: Adult Health

## 2017-04-26 ENCOUNTER — Encounter: Payer: Self-pay | Admitting: Adult Health

## 2017-04-26 DIAGNOSIS — N183 Chronic kidney disease, stage 3 unspecified: Secondary | ICD-10-CM

## 2017-04-26 DIAGNOSIS — D500 Iron deficiency anemia secondary to blood loss (chronic): Secondary | ICD-10-CM | POA: Diagnosis not present

## 2017-04-26 DIAGNOSIS — I2510 Atherosclerotic heart disease of native coronary artery without angina pectoris: Secondary | ICD-10-CM

## 2017-04-26 DIAGNOSIS — E040 Nontoxic diffuse goiter: Secondary | ICD-10-CM

## 2017-04-26 DIAGNOSIS — J42 Unspecified chronic bronchitis: Secondary | ICD-10-CM

## 2017-04-26 DIAGNOSIS — E1121 Type 2 diabetes mellitus with diabetic nephropathy: Secondary | ICD-10-CM

## 2017-04-26 DIAGNOSIS — E049 Nontoxic goiter, unspecified: Secondary | ICD-10-CM

## 2017-04-26 DIAGNOSIS — G4700 Insomnia, unspecified: Secondary | ICD-10-CM | POA: Diagnosis not present

## 2017-04-26 DIAGNOSIS — I10 Essential (primary) hypertension: Secondary | ICD-10-CM

## 2017-04-26 DIAGNOSIS — I5032 Chronic diastolic (congestive) heart failure: Secondary | ICD-10-CM

## 2017-04-26 DIAGNOSIS — R531 Weakness: Secondary | ICD-10-CM | POA: Diagnosis not present

## 2017-04-26 DIAGNOSIS — F0390 Unspecified dementia without behavioral disturbance: Secondary | ICD-10-CM | POA: Diagnosis not present

## 2017-04-26 DIAGNOSIS — Z8719 Personal history of other diseases of the digestive system: Secondary | ICD-10-CM

## 2017-04-26 NOTE — Progress Notes (Signed)
DATE:  04/26/2017   MRN:  [id number]  BIRTHDAY: 11-19-1950  Facility:  Nursing Home Location:  [facility] Room Number: 218-B  LEVEL OF CARE:  SNF (31)  Contact Information    Name Relation Home Work Mobile   Tisdale,[name] Daughter   [phone]       Code Status History    Date Active Date Inactive Code Status Order ID Comments User Context   03/20/2017  5:57 PM 03/23/2017  4:55 PM Full Code [id number]  Waldemar Dickens, MD ED   12/21/2016  7:04 PM 12/24/2016  4:42 PM Full Code [id number]  Elmarie Shiley, MD Inpatient   08/19/2016  1:30 AM 09/06/2016 11:12 PM Full Code [id number]  Esmond Camper Inpatient   08/10/2016  8:32 PM 08/18/2016  7:29 PM Full Code [id number]  Mayo, Pete Pelt, MD Inpatient   07/18/2016  8:34 PM 08/10/2016  3:09 PM Full Code [id number]  Gordan Payment, CNA Inpatient   07/03/2016  5:34 PM 07/18/2016  5:11 PM Full Code [id number]  Collene Gobble, MD Inpatient   07/03/2016  5:34 PM 07/03/2016  5:34 PM DNR [id number]  Collene Gobble, MD Inpatient   06/20/2016 10:44 PM 07/03/2016  5:34 PM Full Code [id number]  Corey Harold, NP ED   08/06/2015  5:34 PM 08/06/2015  5:34 PM Full Code [id number]  Cathlyn Parsons, PA-C Inpatient   08/06/2015  5:34 PM 08/24/2015  8:52 PM Full Code [id number]  Cathlyn Parsons, PA-C Inpatient   08/02/2015  1:20 PM 08/06/2015  5:34 PM Full Code [id number]  Jones Bales, MD Inpatient   02/08/2015  5:03 AM 02/11/2015  3:03 AM Full Code [id number]  Lavina Hamman, MD Inpatient   01/19/2014  9:31 PM 01/21/2014 10:37 PM Full Code [id number]  Etta Quill, DO ED       Chief Complaint  Patient presents with  . Discharge Note    Discharge visit    HISTORY OF PRESENT ILLNESS:  This is a 12-YO female seen for discharge.  She will discharge to home on 04/30/2017 with home health PT, OT, Nursing, and CNA services.    She has been admitted to [facility] on 03/23/17 from [facility]  admission dates 03/20/17 thru 03/23/17 with chronic respiratory failure with hypoxia secondary to chronic diastolic CHF.  CT scan of the neck revealed a massive goiter. She was recently referred to endocrinology for her goiter. She has a PMH of vascular dementia, stroke, diabetes mellitus type 2, hypertension and hyperthyroidism.   Patient was admitted to this facility for short-term rehabilitation after the patient's recent hospitalization.  Patient has completed SNF rehabilitation and therapy has cleared the patient for discharge.   PAST MEDICAL HISTORY:  Past Medical History:  Diagnosis Date  . Arthritis   . Asthma    09/07/16  . Diabetes mellitus, type 2 ([place])   . Diffuse goiter   . Hyperlipidemia   . Hypertension   . Hyperthyroidism   . Obesity hypoventilation syndrome ([place]) 06/20/2013  . Stroke ([place])   . Vascular dementia      CURRENT MEDICATIONS: Reviewed  Patient's Medications  New Prescriptions   No medications on file  Previous Medications   ACETAMINOPHEN (TYLENOL) 500 MG TABLET    Take 500 mg by mouth every 6 (six) hours as needed for mild pain.    ASPIRIN 81 MG CHEWABLE TABLET    Chew 81 mg by mouth daily.  ATORVASTATIN (LIPITOR) 40 MG TABLET    Take 40 mg by mouth every evening.    B COMPLEX-VITAMIN C-FOLIC ACID (NEPHRO-VITE) 0.8 MG TABS TABLET    Take 1 tablet by mouth every morning.   BISACODYL (DULCOLAX) 10 MG SUPPOSITORY    Place 10 mg rectally daily as needed for moderate constipation.   BUDESONIDE (PULMICORT) 0.5 MG/2ML NEBULIZER SOLUTION    Take 0.5 mg by nebulization 2 (two) times daily.   CHOLECALCIFEROL (VITAMIN D) 1000 UNITS TABLET    Take 1,000 Units by mouth daily.   DOCUSATE SODIUM (COLACE) 100 MG CAPSULE    Take 100 mg by mouth daily.    FAMOTIDINE (PEPCID) 20 MG TABLET    Take 20 mg by mouth daily.    FERROUS SULFATE 325 (65 FE) MG TABLET    Take 325 mg by mouth 2 (two) times daily with a meal.   FUROSEMIDE (LASIX) 20 MG TABLET    Take 20 mg by mouth  daily.   INSULIN DETEMIR (LEVEMIR) 100 UNIT/ML INJECTION    Inject 10 Units into the skin at bedtime.    IPRATROPIUM-ALBUTEROL (DUONEB) 0.5-2.5 (3) MG/3ML SOLN    Take 3 mLs by nebulization every 4 (four) hours as needed (for dyspnea).   MAGNESIUM HYDROXIDE (MILK OF MAGNESIA) 400 MG/5ML SUSPENSION    Take 30 mLs by mouth daily as needed for mild constipation.   MELATONIN 3 MG TABS    Take 3 mg by mouth at bedtime.    MEMANTINE (NAMENDA) 10 MG TABLET    Take 10 mg by mouth 2 (two) times daily.    METHIMAZOLE (TAPAZOLE) 10 MG TABLET    Take 10 mg by mouth daily.    METOPROLOL (LOPRESSOR) 50 MG TABLET    Take 50 mg by mouth 2 (two) times daily.   MUPIROCIN OINTMENT (BACTROBAN) 2 %    Place 1 application into the nose 2 (two) times daily. Apply to right breast x10 days   ONDANSETRON (ZOFRAN) 4 MG TABLET    Take 4 mg by mouth every 8 (eight) hours as needed for nausea or vomiting.   POTASSIUM CHLORIDE SA (K-DUR,KLOR-CON) 20 MEQ TABLET    Take 20 mEq by mouth every Monday, Wednesday, and Friday.   SODIUM PHOSPHATES (RA SALINE ENEMA RE)    Place 1 Applicatorful rectally daily as needed (constipation).   TROLAMINE SALICYLATE (ASPERCREME) 10 % CREAM    Apply 1 application topically 2 (two) times daily.   Modified Medications   No medications on file  Discontinued Medications   No medications on file     Allergies  Allergen Reactions  . Penicillins Swelling    FACIAL SWELLING Has patient had a PCN reaction causing immediate rash, facial/tongue/throat swelling, SOB or lightheadedness with hypotension: Yes Has patient had a PCN reaction causing severe rash involving mucus membranes or skin necrosis: No Has patient had a PCN reaction that required hospitalization: No Has patient had a PCN reaction occurring within the last 10 years: No If all of the above answers are "NO", then may proceed with Cephalosporin use.   . Lactose Intolerance (Gi) Diarrhea and Nausea And Vomiting     REVIEW OF  SYSTEMS:  Unable to obtain due to dementia     PHYSICAL EXAMINATION  GENERAL APPEARANCE: Well nourished. In no acute distress. Obese SKIN:  Skin is warm and dry.  HEAD: Normal in size and contour. No evidence of trauma EYES: Lids open and close normally. No blepharitis, entropion or ectropion.  EARS: Pinnae are normal. Patient hears normal voice tunes of the examiner MOUTH and THROAT: Lips are without lesions. Oral mucosa is moist and without lesions. Tongue is normal in shape, size, and color and without lesions NECK: +massive goiter RESPIRATORY: breathing is even & unlabored, BS CTAB CARDIAC: RRR, no murmur,no extra heart sounds, RLE 2+edema and LLE 1+ edema GI: abdomen soft, normal BS, no masses, no tenderness, no hepatomegaly, no splenomegaly EXTREMITIES:  Able to move X 4 extremities, BLE generalized weakness PSYCHIATRIC: Alert to self, disoriented to time and place. Affect and behavior are appropriate   LABS/RADIOLOGY: Labs reviewed: Basic Metabolic Panel:  Recent Labs  08/23/16 0410  08/28/16 0626 08/30/16 0700 08/31/16 0628  03/20/17 1416 03/21/17 0437 03/22/17 0737 04/09/17  NA 138  < > 133* 133* 135  < > 138 138 135 141  K 4.3  < > 5.2* 5.3* 4.5  < > 4.5 5.3* 4.4 4.9  CL 104  < > 95* 97* 98*  < > 105 104 101  --   CO2 27  < > 30 30 28   < > 24 25 26   --   GLUCOSE 150*  < > 174* 154* 202*  < > 211* 151* 215*  --   BUN 11  < > 11 18 19   < > 36* 41* 45* 37*  CREATININE 0.95  < > 1.02* 1.34* 1.41*  < > 1.58* 1.78* 1.71* 2.1*  CALCIUM 8.8*  < > 9.0 9.3 9.2  < > 9.3 9.6 9.3  --   MG 1.9  --  1.3* 2.1  --   --   --   --   --   --   PHOS  --   --  4.2 5.1* 5.3*  --   --   --   --   --   < > = values in this interval not displayed. Liver Function Tests:  Recent Labs  07/19/16 0633  08/09/16 2026  08/19/16 0628  08/28/16 0626 08/30/16 0700 08/31/16 0628  AST 23  --  22  --  17  --   --   --   --   ALT 23  --  11*  --  9*  --   --   --   --   ALKPHOS 135*  --   93  --  63  --   --   --   --   BILITOT 0.9  --  0.7  --  0.3  --   --   --   --   PROT 6.0*  --  5.8*  --  4.7*  --   --   --   --   ALBUMIN 1.5*  < > 1.7*  < > 1.3*  < > 1.8* 2.1* 2.1*  < > = values in this interval not displayed.  Recent Labs  06/22/16 0334 06/23/16 0332 06/29/16 1130  AMMONIA 72* 60* 21   CBC:  Recent Labs  02/06/17 2057  03/10/17 03/20/17 03/20/17 1416 03/21/17 0437  WBC 12.5*  < > 8.7 8.3 11.9* 7.9  NEUTROABS 10.4*  < > 6 6 10.1*  --   HGB 9.3*  < > 8.7* 9.1* 9.3* 8.9*  HCT 29.2*  < > 26* 30* 29.6* 29.1*  MCV 86.6  --   --   --  87.3 87.1  PLT 223  < > 194 209 216 217  < > = values in this interval not displayed. Cardiac Enzymes:  Recent Labs  06/20/16 2058 06/21/16 0135 06/21/16 0442 06/21/16 0900  CKTOTAL 34*  --   --   --   TROPONINI  --  0.04* 0.06* 0.06*   CBG:  Recent Labs  03/22/17 2102 03/23/17 0741 03/23/17 1121  GLUCAP 206* 102* 168*      Dg Chest 2 View  Result Date: 04/09/2017 CLINICAL DATA:  Short of breath.  Chronic respiratory failure EXAM: CHEST  2 VIEW COMPARISON:  03/20/2017 FINDINGS: Interval improvement in vascular congestion. No edema or effusion. Mild bibasilar atelectasis improved. Heart size upper normal IMPRESSION: Interval improvement in congestive heart failure Mild bibasilar atelectasis also improved Electronically Signed   By: Franchot Gallo M.D.   On: 04/09/2017 11:56    ASSESSMENT/PLAN:  1. Generalized weakness - For home health PT and OT for therapeutic strengthening exercises; fall precautions   2. Chronic diastolic CHF (congestive heart failure) (HCC) - no SOB; continue metoprolol tartrate 50 mg 1 tab twice a day, Lasix 20 mg 1 tab daily, KCl ER 20 meq 1 tab daily on MWF   3. Goiter diffuse - recently referred to endocrinology; continue methimazole 10 mg 1 tab daily Lab Results  Component Value Date   TSH 1.83 04/12/2017    4. CKD (chronic kidney disease) stage 3, GFR 30-59 ml/min - follow-up  with PCP Lab Results  Component Value Date   CREATININE 2.1 (A) 04/09/2017    5. Type 2 diabetes mellitus with diabetic nephropathy, unspecified whether long term insulin use (HCC) - continue Levemir 100 units/mL inject 10 units subcutaneous daily at bedtime Lab Results  Component Value Date   HGBA1C 7.4 03/10/2017     6. Iron deficiency anemia due to chronic blood loss - continue ferrous sulfate 325 mg 1 tab twice a day Lab Results  Component Value Date   HGB 8.9 (L) 03/21/2017     7. Dementia without behavioral disturbance, unspecified dementia type - continue memantine 10 mg 1 tab twice a day, supportive care and fall precautions   8. History of GI bleed - no signs of bleeding, continue famotidine 20 mg 1 tab daily   9. Chronic bronchitis, unspecified chronic bronchitis type ([place]) - no SOB nor wheezing; continue ipratropium-albuterol 0.5-3 (2.5) MG/3 mL use 1 vial via nebulizer every 4 hours when necessary, budesonide 0.5 mg/2 mL inhale 1 vial via nebulizer twice a day   10. Essential hypertension - well controlled; continue metoprolol tartrate 50 mg 1 tab twice a day   11. Atherosclerosis of native coronary artery of native heart without angina pectoris - stable; continue aspirin 81 mg 1 tab 3 times a day   12. Insomnia, unspecified type - continue melatonin 3 mg 1 tab daily at bedtime       I have filled out patient's discharge paperwork and written prescriptions.  Patient will receive home health PT, OT, Nursing and Nursing Aid  DME provided:  [facility] lift, trapeze bar, nebulizer mask/tubing/filters, overbed table, Geri chair, 240 bariatric briefs, 240 bariatric pull-ups, and 240 Chux.   Total discharge time: Greater than 30 minutes Greater than 50% was spent in counseling and coordination of care.   Discharge time involved coordination of the discharge process with social worker, nursing staff and therapy department. Medical justification for home health  services/DME verified.    Monina C. Tallula - NP    [employer] [phone]

## 2017-05-02 ENCOUNTER — Encounter: Payer: Self-pay | Admitting: Podiatry

## 2017-05-02 ENCOUNTER — Ambulatory Visit (INDEPENDENT_AMBULATORY_CARE_PROVIDER_SITE_OTHER): Payer: Medicare Other | Admitting: Podiatry

## 2017-05-02 DIAGNOSIS — B351 Tinea unguium: Secondary | ICD-10-CM | POA: Diagnosis not present

## 2017-05-02 DIAGNOSIS — M79676 Pain in unspecified toe(s): Secondary | ICD-10-CM

## 2017-05-02 DIAGNOSIS — E1142 Type 2 diabetes mellitus with diabetic polyneuropathy: Secondary | ICD-10-CM

## 2017-05-02 NOTE — Progress Notes (Signed)
Patient ID: Angelica Beck, female   DOB: [birth year], 66 y.o.   MRN: [id number] Complaint:  Visit Type: Patient returns to my office for continued preventative foot care services. Complaint: Patient states" my nails have grown long and thick and become painful to walk and wear shoes" Patient has been diagnosed with DM with no foot complications. The patient presents for preventative foot care services. No changes to ROS  Podiatric Exam: Vascular: dorsalis pedis and posterior tibial pulses are palpable bilateral. Capillary return is immediate. Temperature gradient is WNL. Skin turgor WNL  Sensorium: Normal Semmes Weinstein monofilament test. Normal tactile sensation bilaterally. Nail Exam: Pt has thick disfigured discolored nails with subungual debris noted bilateral entire nail hallux through fifth toenails Ulcer Exam: There is no evidence of ulcer or pre-ulcerative changes or infection. Orthopedic Exam: Muscle tone and strength are WNL. No limitations in general ROM. No crepitus or effusions noted. Foot type and digits show no abnormalities. Bony prominences are unremarkable. Skin: No Porokeratosis. No infection or ulcers  Diagnosis:  Onychomycosis, , Pain in right toe, pain in left toes  Treatment & Plan Procedures and Treatment: Consent by patient was obtained for treatment procedures. The patient understood the discussion of treatment and procedures well. All questions were answered thoroughly reviewed. Debridement of mycotic and hypertrophic toenails, 1 through 5 bilateral and clearing of subungual debris. No ulceration, no infection noted.  Return Visit-Office Procedure: Patient instructed to return to the office for a follow up visit 3 months for continued evaluation and treatment.   Gardiner Barefoot DPM

## 2017-05-06 DIAGNOSIS — Z87891 Personal history of nicotine dependence: Secondary | ICD-10-CM | POA: Diagnosis not present

## 2017-05-06 DIAGNOSIS — Z993 Dependence on wheelchair: Secondary | ICD-10-CM | POA: Diagnosis not present

## 2017-05-06 DIAGNOSIS — I130 Hypertensive heart and chronic kidney disease with heart failure and stage 1 through stage 4 chronic kidney disease, or unspecified chronic kidney disease: Secondary | ICD-10-CM | POA: Diagnosis not present

## 2017-05-06 DIAGNOSIS — Z9181 History of falling: Secondary | ICD-10-CM | POA: Diagnosis not present

## 2017-05-06 DIAGNOSIS — J9611 Chronic respiratory failure with hypoxia: Secondary | ICD-10-CM | POA: Diagnosis not present

## 2017-05-06 DIAGNOSIS — Z8673 Personal history of transient ischemic attack (TIA), and cerebral infarction without residual deficits: Secondary | ICD-10-CM | POA: Diagnosis not present

## 2017-05-06 DIAGNOSIS — Z9981 Dependence on supplemental oxygen: Secondary | ICD-10-CM | POA: Diagnosis not present

## 2017-05-06 DIAGNOSIS — Z794 Long term (current) use of insulin: Secondary | ICD-10-CM | POA: Diagnosis not present

## 2017-05-06 DIAGNOSIS — M1990 Unspecified osteoarthritis, unspecified site: Secondary | ICD-10-CM | POA: Diagnosis not present

## 2017-05-06 DIAGNOSIS — F0150 Vascular dementia without behavioral disturbance: Secondary | ICD-10-CM | POA: Diagnosis not present

## 2017-05-06 DIAGNOSIS — E1122 Type 2 diabetes mellitus with diabetic chronic kidney disease: Secondary | ICD-10-CM | POA: Diagnosis not present

## 2017-05-06 DIAGNOSIS — I2510 Atherosclerotic heart disease of native coronary artery without angina pectoris: Secondary | ICD-10-CM | POA: Diagnosis not present

## 2017-05-06 DIAGNOSIS — N183 Chronic kidney disease, stage 3 (moderate): Secondary | ICD-10-CM | POA: Diagnosis not present

## 2017-05-06 DIAGNOSIS — M6281 Muscle weakness (generalized): Secondary | ICD-10-CM | POA: Diagnosis not present

## 2017-05-06 DIAGNOSIS — I5032 Chronic diastolic (congestive) heart failure: Secondary | ICD-10-CM | POA: Diagnosis not present

## 2017-05-06 DIAGNOSIS — E662 Morbid (severe) obesity with alveolar hypoventilation: Secondary | ICD-10-CM | POA: Diagnosis not present

## 2017-05-06 DIAGNOSIS — Z6830 Body mass index (BMI) 30.0-30.9, adult: Secondary | ICD-10-CM | POA: Diagnosis not present

## 2017-05-06 DIAGNOSIS — E0500 Thyrotoxicosis with diffuse goiter without thyrotoxic crisis or storm: Secondary | ICD-10-CM | POA: Diagnosis not present

## 2017-05-08 DIAGNOSIS — N183 Chronic kidney disease, stage 3 (moderate): Secondary | ICD-10-CM | POA: Diagnosis not present

## 2017-05-08 DIAGNOSIS — I2510 Atherosclerotic heart disease of native coronary artery without angina pectoris: Secondary | ICD-10-CM | POA: Diagnosis not present

## 2017-05-08 DIAGNOSIS — Z1211 Encounter for screening for malignant neoplasm of colon: Secondary | ICD-10-CM | POA: Diagnosis not present

## 2017-05-08 DIAGNOSIS — I10 Essential (primary) hypertension: Secondary | ICD-10-CM | POA: Diagnosis not present

## 2017-05-08 DIAGNOSIS — E1165 Type 2 diabetes mellitus with hyperglycemia: Secondary | ICD-10-CM | POA: Diagnosis not present

## 2017-05-08 DIAGNOSIS — Z13820 Encounter for screening for osteoporosis: Secondary | ICD-10-CM | POA: Diagnosis not present

## 2017-05-08 DIAGNOSIS — Z1231 Encounter for screening mammogram for malignant neoplasm of breast: Secondary | ICD-10-CM | POA: Diagnosis not present

## 2017-05-08 DIAGNOSIS — I5032 Chronic diastolic (congestive) heart failure: Secondary | ICD-10-CM | POA: Diagnosis not present

## 2017-05-08 DIAGNOSIS — M6281 Muscle weakness (generalized): Secondary | ICD-10-CM | POA: Diagnosis not present

## 2017-05-08 DIAGNOSIS — E1122 Type 2 diabetes mellitus with diabetic chronic kidney disease: Secondary | ICD-10-CM | POA: Diagnosis not present

## 2017-05-08 DIAGNOSIS — Z136 Encounter for screening for cardiovascular disorders: Secondary | ICD-10-CM | POA: Diagnosis not present

## 2017-05-08 DIAGNOSIS — I130 Hypertensive heart and chronic kidney disease with heart failure and stage 1 through stage 4 chronic kidney disease, or unspecified chronic kidney disease: Secondary | ICD-10-CM | POA: Diagnosis not present

## 2017-05-08 DIAGNOSIS — J449 Chronic obstructive pulmonary disease, unspecified: Secondary | ICD-10-CM | POA: Diagnosis not present

## 2017-05-09 DIAGNOSIS — I130 Hypertensive heart and chronic kidney disease with heart failure and stage 1 through stage 4 chronic kidney disease, or unspecified chronic kidney disease: Secondary | ICD-10-CM | POA: Diagnosis not present

## 2017-05-09 DIAGNOSIS — M6281 Muscle weakness (generalized): Secondary | ICD-10-CM | POA: Diagnosis not present

## 2017-05-09 DIAGNOSIS — I5032 Chronic diastolic (congestive) heart failure: Secondary | ICD-10-CM | POA: Diagnosis not present

## 2017-05-09 DIAGNOSIS — I2510 Atherosclerotic heart disease of native coronary artery without angina pectoris: Secondary | ICD-10-CM | POA: Diagnosis not present

## 2017-05-09 DIAGNOSIS — N183 Chronic kidney disease, stage 3 (moderate): Secondary | ICD-10-CM | POA: Diagnosis not present

## 2017-05-09 DIAGNOSIS — E1122 Type 2 diabetes mellitus with diabetic chronic kidney disease: Secondary | ICD-10-CM | POA: Diagnosis not present

## 2017-05-11 DIAGNOSIS — M6281 Muscle weakness (generalized): Secondary | ICD-10-CM | POA: Diagnosis not present

## 2017-05-11 DIAGNOSIS — I2510 Atherosclerotic heart disease of native coronary artery without angina pectoris: Secondary | ICD-10-CM | POA: Diagnosis not present

## 2017-05-11 DIAGNOSIS — I5032 Chronic diastolic (congestive) heart failure: Secondary | ICD-10-CM | POA: Diagnosis not present

## 2017-05-11 DIAGNOSIS — I130 Hypertensive heart and chronic kidney disease with heart failure and stage 1 through stage 4 chronic kidney disease, or unspecified chronic kidney disease: Secondary | ICD-10-CM | POA: Diagnosis not present

## 2017-05-11 DIAGNOSIS — N183 Chronic kidney disease, stage 3 (moderate): Secondary | ICD-10-CM | POA: Diagnosis not present

## 2017-05-11 DIAGNOSIS — E1122 Type 2 diabetes mellitus with diabetic chronic kidney disease: Secondary | ICD-10-CM | POA: Diagnosis not present

## 2017-05-14 DIAGNOSIS — I2510 Atherosclerotic heart disease of native coronary artery without angina pectoris: Secondary | ICD-10-CM | POA: Diagnosis not present

## 2017-05-14 DIAGNOSIS — E1122 Type 2 diabetes mellitus with diabetic chronic kidney disease: Secondary | ICD-10-CM | POA: Diagnosis not present

## 2017-05-14 DIAGNOSIS — I5032 Chronic diastolic (congestive) heart failure: Secondary | ICD-10-CM | POA: Diagnosis not present

## 2017-05-14 DIAGNOSIS — N183 Chronic kidney disease, stage 3 (moderate): Secondary | ICD-10-CM | POA: Diagnosis not present

## 2017-05-14 DIAGNOSIS — M6281 Muscle weakness (generalized): Secondary | ICD-10-CM | POA: Diagnosis not present

## 2017-05-14 DIAGNOSIS — I130 Hypertensive heart and chronic kidney disease with heart failure and stage 1 through stage 4 chronic kidney disease, or unspecified chronic kidney disease: Secondary | ICD-10-CM | POA: Diagnosis not present

## 2017-05-15 DIAGNOSIS — I130 Hypertensive heart and chronic kidney disease with heart failure and stage 1 through stage 4 chronic kidney disease, or unspecified chronic kidney disease: Secondary | ICD-10-CM | POA: Diagnosis not present

## 2017-05-15 DIAGNOSIS — I5032 Chronic diastolic (congestive) heart failure: Secondary | ICD-10-CM | POA: Diagnosis not present

## 2017-05-15 DIAGNOSIS — E1122 Type 2 diabetes mellitus with diabetic chronic kidney disease: Secondary | ICD-10-CM | POA: Diagnosis not present

## 2017-05-15 DIAGNOSIS — M6281 Muscle weakness (generalized): Secondary | ICD-10-CM | POA: Diagnosis not present

## 2017-05-15 DIAGNOSIS — N183 Chronic kidney disease, stage 3 (moderate): Secondary | ICD-10-CM | POA: Diagnosis not present

## 2017-05-15 DIAGNOSIS — I2510 Atherosclerotic heart disease of native coronary artery without angina pectoris: Secondary | ICD-10-CM | POA: Diagnosis not present

## 2017-05-16 DIAGNOSIS — I130 Hypertensive heart and chronic kidney disease with heart failure and stage 1 through stage 4 chronic kidney disease, or unspecified chronic kidney disease: Secondary | ICD-10-CM | POA: Diagnosis not present

## 2017-05-16 DIAGNOSIS — M6281 Muscle weakness (generalized): Secondary | ICD-10-CM | POA: Diagnosis not present

## 2017-05-16 DIAGNOSIS — E1122 Type 2 diabetes mellitus with diabetic chronic kidney disease: Secondary | ICD-10-CM | POA: Diagnosis not present

## 2017-05-16 DIAGNOSIS — I2510 Atherosclerotic heart disease of native coronary artery without angina pectoris: Secondary | ICD-10-CM | POA: Diagnosis not present

## 2017-05-16 DIAGNOSIS — N183 Chronic kidney disease, stage 3 (moderate): Secondary | ICD-10-CM | POA: Diagnosis not present

## 2017-05-16 DIAGNOSIS — I5032 Chronic diastolic (congestive) heart failure: Secondary | ICD-10-CM | POA: Diagnosis not present

## 2017-05-17 DIAGNOSIS — E1122 Type 2 diabetes mellitus with diabetic chronic kidney disease: Secondary | ICD-10-CM | POA: Diagnosis not present

## 2017-05-17 DIAGNOSIS — I5032 Chronic diastolic (congestive) heart failure: Secondary | ICD-10-CM | POA: Diagnosis not present

## 2017-05-17 DIAGNOSIS — M6281 Muscle weakness (generalized): Secondary | ICD-10-CM | POA: Diagnosis not present

## 2017-05-17 DIAGNOSIS — I2510 Atherosclerotic heart disease of native coronary artery without angina pectoris: Secondary | ICD-10-CM | POA: Diagnosis not present

## 2017-05-17 DIAGNOSIS — I130 Hypertensive heart and chronic kidney disease with heart failure and stage 1 through stage 4 chronic kidney disease, or unspecified chronic kidney disease: Secondary | ICD-10-CM | POA: Diagnosis not present

## 2017-05-17 DIAGNOSIS — N183 Chronic kidney disease, stage 3 (moderate): Secondary | ICD-10-CM | POA: Diagnosis not present

## 2017-05-18 DIAGNOSIS — M6281 Muscle weakness (generalized): Secondary | ICD-10-CM | POA: Diagnosis not present

## 2017-05-18 DIAGNOSIS — I130 Hypertensive heart and chronic kidney disease with heart failure and stage 1 through stage 4 chronic kidney disease, or unspecified chronic kidney disease: Secondary | ICD-10-CM | POA: Diagnosis not present

## 2017-05-18 DIAGNOSIS — N183 Chronic kidney disease, stage 3 (moderate): Secondary | ICD-10-CM | POA: Diagnosis not present

## 2017-05-18 DIAGNOSIS — I5032 Chronic diastolic (congestive) heart failure: Secondary | ICD-10-CM | POA: Diagnosis not present

## 2017-05-18 DIAGNOSIS — I2510 Atherosclerotic heart disease of native coronary artery without angina pectoris: Secondary | ICD-10-CM | POA: Diagnosis not present

## 2017-05-18 DIAGNOSIS — E1122 Type 2 diabetes mellitus with diabetic chronic kidney disease: Secondary | ICD-10-CM | POA: Diagnosis not present

## 2017-05-19 DIAGNOSIS — M6281 Muscle weakness (generalized): Secondary | ICD-10-CM | POA: Diagnosis not present

## 2017-05-19 DIAGNOSIS — I2510 Atherosclerotic heart disease of native coronary artery without angina pectoris: Secondary | ICD-10-CM | POA: Diagnosis not present

## 2017-05-19 DIAGNOSIS — I130 Hypertensive heart and chronic kidney disease with heart failure and stage 1 through stage 4 chronic kidney disease, or unspecified chronic kidney disease: Secondary | ICD-10-CM | POA: Diagnosis not present

## 2017-05-19 DIAGNOSIS — E1122 Type 2 diabetes mellitus with diabetic chronic kidney disease: Secondary | ICD-10-CM | POA: Diagnosis not present

## 2017-05-19 DIAGNOSIS — I5032 Chronic diastolic (congestive) heart failure: Secondary | ICD-10-CM | POA: Diagnosis not present

## 2017-05-19 DIAGNOSIS — N183 Chronic kidney disease, stage 3 (moderate): Secondary | ICD-10-CM | POA: Diagnosis not present

## 2017-05-21 DIAGNOSIS — M6281 Muscle weakness (generalized): Secondary | ICD-10-CM | POA: Diagnosis not present

## 2017-05-21 DIAGNOSIS — I5032 Chronic diastolic (congestive) heart failure: Secondary | ICD-10-CM | POA: Diagnosis not present

## 2017-05-21 DIAGNOSIS — N183 Chronic kidney disease, stage 3 (moderate): Secondary | ICD-10-CM | POA: Diagnosis not present

## 2017-05-21 DIAGNOSIS — I130 Hypertensive heart and chronic kidney disease with heart failure and stage 1 through stage 4 chronic kidney disease, or unspecified chronic kidney disease: Secondary | ICD-10-CM | POA: Diagnosis not present

## 2017-05-21 DIAGNOSIS — E1122 Type 2 diabetes mellitus with diabetic chronic kidney disease: Secondary | ICD-10-CM | POA: Diagnosis not present

## 2017-05-21 DIAGNOSIS — I2510 Atherosclerotic heart disease of native coronary artery without angina pectoris: Secondary | ICD-10-CM | POA: Diagnosis not present

## 2017-05-22 DIAGNOSIS — M6281 Muscle weakness (generalized): Secondary | ICD-10-CM | POA: Diagnosis not present

## 2017-05-22 DIAGNOSIS — I2510 Atherosclerotic heart disease of native coronary artery without angina pectoris: Secondary | ICD-10-CM | POA: Diagnosis not present

## 2017-05-22 DIAGNOSIS — E1122 Type 2 diabetes mellitus with diabetic chronic kidney disease: Secondary | ICD-10-CM | POA: Diagnosis not present

## 2017-05-22 DIAGNOSIS — I130 Hypertensive heart and chronic kidney disease with heart failure and stage 1 through stage 4 chronic kidney disease, or unspecified chronic kidney disease: Secondary | ICD-10-CM | POA: Diagnosis not present

## 2017-05-22 DIAGNOSIS — N183 Chronic kidney disease, stage 3 (moderate): Secondary | ICD-10-CM | POA: Diagnosis not present

## 2017-05-22 DIAGNOSIS — I5032 Chronic diastolic (congestive) heart failure: Secondary | ICD-10-CM | POA: Diagnosis not present

## 2017-05-23 DIAGNOSIS — E8770 Fluid overload, unspecified: Secondary | ICD-10-CM | POA: Diagnosis not present

## 2017-05-23 DIAGNOSIS — I129 Hypertensive chronic kidney disease with stage 1 through stage 4 chronic kidney disease, or unspecified chronic kidney disease: Secondary | ICD-10-CM | POA: Diagnosis not present

## 2017-05-23 DIAGNOSIS — F0150 Vascular dementia without behavioral disturbance: Secondary | ICD-10-CM | POA: Diagnosis not present

## 2017-05-23 DIAGNOSIS — I639 Cerebral infarction, unspecified: Secondary | ICD-10-CM | POA: Diagnosis not present

## 2017-05-23 DIAGNOSIS — D649 Anemia, unspecified: Secondary | ICD-10-CM | POA: Diagnosis not present

## 2017-05-23 DIAGNOSIS — E1165 Type 2 diabetes mellitus with hyperglycemia: Secondary | ICD-10-CM | POA: Diagnosis not present

## 2017-05-23 DIAGNOSIS — N183 Chronic kidney disease, stage 3 (moderate): Secondary | ICD-10-CM | POA: Diagnosis not present

## 2017-05-23 DIAGNOSIS — E1129 Type 2 diabetes mellitus with other diabetic kidney complication: Secondary | ICD-10-CM | POA: Diagnosis not present

## 2017-05-23 DIAGNOSIS — I10 Essential (primary) hypertension: Secondary | ICD-10-CM | POA: Diagnosis not present

## 2017-05-24 DIAGNOSIS — I2510 Atherosclerotic heart disease of native coronary artery without angina pectoris: Secondary | ICD-10-CM | POA: Diagnosis not present

## 2017-05-24 DIAGNOSIS — I5032 Chronic diastolic (congestive) heart failure: Secondary | ICD-10-CM | POA: Diagnosis not present

## 2017-05-24 DIAGNOSIS — M6281 Muscle weakness (generalized): Secondary | ICD-10-CM | POA: Diagnosis not present

## 2017-05-24 DIAGNOSIS — E1122 Type 2 diabetes mellitus with diabetic chronic kidney disease: Secondary | ICD-10-CM | POA: Diagnosis not present

## 2017-05-24 DIAGNOSIS — I130 Hypertensive heart and chronic kidney disease with heart failure and stage 1 through stage 4 chronic kidney disease, or unspecified chronic kidney disease: Secondary | ICD-10-CM | POA: Diagnosis not present

## 2017-05-24 DIAGNOSIS — N183 Chronic kidney disease, stage 3 (moderate): Secondary | ICD-10-CM | POA: Diagnosis not present

## 2017-05-25 DIAGNOSIS — N183 Chronic kidney disease, stage 3 (moderate): Secondary | ICD-10-CM | POA: Diagnosis not present

## 2017-05-25 DIAGNOSIS — I2510 Atherosclerotic heart disease of native coronary artery without angina pectoris: Secondary | ICD-10-CM | POA: Diagnosis not present

## 2017-05-25 DIAGNOSIS — E1122 Type 2 diabetes mellitus with diabetic chronic kidney disease: Secondary | ICD-10-CM | POA: Diagnosis not present

## 2017-05-25 DIAGNOSIS — I5032 Chronic diastolic (congestive) heart failure: Secondary | ICD-10-CM | POA: Diagnosis not present

## 2017-05-25 DIAGNOSIS — M6281 Muscle weakness (generalized): Secondary | ICD-10-CM | POA: Diagnosis not present

## 2017-05-25 DIAGNOSIS — I130 Hypertensive heart and chronic kidney disease with heart failure and stage 1 through stage 4 chronic kidney disease, or unspecified chronic kidney disease: Secondary | ICD-10-CM | POA: Diagnosis not present

## 2017-05-29 DIAGNOSIS — E1122 Type 2 diabetes mellitus with diabetic chronic kidney disease: Secondary | ICD-10-CM | POA: Diagnosis not present

## 2017-05-29 DIAGNOSIS — M6281 Muscle weakness (generalized): Secondary | ICD-10-CM | POA: Diagnosis not present

## 2017-05-29 DIAGNOSIS — I2510 Atherosclerotic heart disease of native coronary artery without angina pectoris: Secondary | ICD-10-CM | POA: Diagnosis not present

## 2017-05-29 DIAGNOSIS — N183 Chronic kidney disease, stage 3 (moderate): Secondary | ICD-10-CM | POA: Diagnosis not present

## 2017-05-29 DIAGNOSIS — I130 Hypertensive heart and chronic kidney disease with heart failure and stage 1 through stage 4 chronic kidney disease, or unspecified chronic kidney disease: Secondary | ICD-10-CM | POA: Diagnosis not present

## 2017-05-29 DIAGNOSIS — I5032 Chronic diastolic (congestive) heart failure: Secondary | ICD-10-CM | POA: Diagnosis not present

## 2017-05-30 DIAGNOSIS — I5032 Chronic diastolic (congestive) heart failure: Secondary | ICD-10-CM | POA: Diagnosis not present

## 2017-05-30 DIAGNOSIS — I2510 Atherosclerotic heart disease of native coronary artery without angina pectoris: Secondary | ICD-10-CM | POA: Diagnosis not present

## 2017-05-30 DIAGNOSIS — N183 Chronic kidney disease, stage 3 (moderate): Secondary | ICD-10-CM | POA: Diagnosis not present

## 2017-05-30 DIAGNOSIS — M6281 Muscle weakness (generalized): Secondary | ICD-10-CM | POA: Diagnosis not present

## 2017-05-30 DIAGNOSIS — E1122 Type 2 diabetes mellitus with diabetic chronic kidney disease: Secondary | ICD-10-CM | POA: Diagnosis not present

## 2017-05-30 DIAGNOSIS — I130 Hypertensive heart and chronic kidney disease with heart failure and stage 1 through stage 4 chronic kidney disease, or unspecified chronic kidney disease: Secondary | ICD-10-CM | POA: Diagnosis not present

## 2017-06-01 ENCOUNTER — Ambulatory Visit (HOSPITAL_COMMUNITY): Payer: PRIVATE HEALTH INSURANCE

## 2017-06-01 DIAGNOSIS — M6281 Muscle weakness (generalized): Secondary | ICD-10-CM | POA: Diagnosis not present

## 2017-06-01 DIAGNOSIS — I130 Hypertensive heart and chronic kidney disease with heart failure and stage 1 through stage 4 chronic kidney disease, or unspecified chronic kidney disease: Secondary | ICD-10-CM | POA: Diagnosis not present

## 2017-06-01 DIAGNOSIS — N183 Chronic kidney disease, stage 3 (moderate): Secondary | ICD-10-CM | POA: Diagnosis not present

## 2017-06-01 DIAGNOSIS — I2510 Atherosclerotic heart disease of native coronary artery without angina pectoris: Secondary | ICD-10-CM | POA: Diagnosis not present

## 2017-06-01 DIAGNOSIS — E1122 Type 2 diabetes mellitus with diabetic chronic kidney disease: Secondary | ICD-10-CM | POA: Diagnosis not present

## 2017-06-01 DIAGNOSIS — I5032 Chronic diastolic (congestive) heart failure: Secondary | ICD-10-CM | POA: Diagnosis not present

## 2017-06-04 DIAGNOSIS — I5032 Chronic diastolic (congestive) heart failure: Secondary | ICD-10-CM | POA: Diagnosis not present

## 2017-06-04 DIAGNOSIS — I130 Hypertensive heart and chronic kidney disease with heart failure and stage 1 through stage 4 chronic kidney disease, or unspecified chronic kidney disease: Secondary | ICD-10-CM | POA: Diagnosis not present

## 2017-06-04 DIAGNOSIS — M6281 Muscle weakness (generalized): Secondary | ICD-10-CM | POA: Diagnosis not present

## 2017-06-04 DIAGNOSIS — I2510 Atherosclerotic heart disease of native coronary artery without angina pectoris: Secondary | ICD-10-CM | POA: Diagnosis not present

## 2017-06-04 DIAGNOSIS — E1122 Type 2 diabetes mellitus with diabetic chronic kidney disease: Secondary | ICD-10-CM | POA: Diagnosis not present

## 2017-06-04 DIAGNOSIS — N183 Chronic kidney disease, stage 3 (moderate): Secondary | ICD-10-CM | POA: Diagnosis not present

## 2017-06-05 DIAGNOSIS — E1165 Type 2 diabetes mellitus with hyperglycemia: Secondary | ICD-10-CM | POA: Diagnosis not present

## 2017-06-05 DIAGNOSIS — E1122 Type 2 diabetes mellitus with diabetic chronic kidney disease: Secondary | ICD-10-CM | POA: Diagnosis not present

## 2017-06-05 DIAGNOSIS — I10 Essential (primary) hypertension: Secondary | ICD-10-CM | POA: Diagnosis not present

## 2017-06-05 DIAGNOSIS — N183 Chronic kidney disease, stage 3 (moderate): Secondary | ICD-10-CM | POA: Diagnosis not present

## 2017-06-05 DIAGNOSIS — I5032 Chronic diastolic (congestive) heart failure: Secondary | ICD-10-CM | POA: Diagnosis not present

## 2017-06-05 DIAGNOSIS — J449 Chronic obstructive pulmonary disease, unspecified: Secondary | ICD-10-CM | POA: Diagnosis not present

## 2017-06-05 DIAGNOSIS — I130 Hypertensive heart and chronic kidney disease with heart failure and stage 1 through stage 4 chronic kidney disease, or unspecified chronic kidney disease: Secondary | ICD-10-CM | POA: Diagnosis not present

## 2017-06-05 DIAGNOSIS — M6281 Muscle weakness (generalized): Secondary | ICD-10-CM | POA: Diagnosis not present

## 2017-06-05 DIAGNOSIS — I2510 Atherosclerotic heart disease of native coronary artery without angina pectoris: Secondary | ICD-10-CM | POA: Diagnosis not present

## 2017-06-06 DIAGNOSIS — M6281 Muscle weakness (generalized): Secondary | ICD-10-CM | POA: Diagnosis not present

## 2017-06-06 DIAGNOSIS — I130 Hypertensive heart and chronic kidney disease with heart failure and stage 1 through stage 4 chronic kidney disease, or unspecified chronic kidney disease: Secondary | ICD-10-CM | POA: Diagnosis not present

## 2017-06-06 DIAGNOSIS — N183 Chronic kidney disease, stage 3 (moderate): Secondary | ICD-10-CM | POA: Diagnosis not present

## 2017-06-06 DIAGNOSIS — I5032 Chronic diastolic (congestive) heart failure: Secondary | ICD-10-CM | POA: Diagnosis not present

## 2017-06-06 DIAGNOSIS — E1122 Type 2 diabetes mellitus with diabetic chronic kidney disease: Secondary | ICD-10-CM | POA: Diagnosis not present

## 2017-06-06 DIAGNOSIS — I2510 Atherosclerotic heart disease of native coronary artery without angina pectoris: Secondary | ICD-10-CM | POA: Diagnosis not present

## 2017-06-07 DIAGNOSIS — E1122 Type 2 diabetes mellitus with diabetic chronic kidney disease: Secondary | ICD-10-CM | POA: Diagnosis not present

## 2017-06-07 DIAGNOSIS — M6281 Muscle weakness (generalized): Secondary | ICD-10-CM | POA: Diagnosis not present

## 2017-06-07 DIAGNOSIS — N183 Chronic kidney disease, stage 3 (moderate): Secondary | ICD-10-CM | POA: Diagnosis not present

## 2017-06-07 DIAGNOSIS — M25551 Pain in right hip: Secondary | ICD-10-CM | POA: Diagnosis not present

## 2017-06-07 DIAGNOSIS — I130 Hypertensive heart and chronic kidney disease with heart failure and stage 1 through stage 4 chronic kidney disease, or unspecified chronic kidney disease: Secondary | ICD-10-CM | POA: Diagnosis not present

## 2017-06-07 DIAGNOSIS — I2510 Atherosclerotic heart disease of native coronary artery without angina pectoris: Secondary | ICD-10-CM | POA: Diagnosis not present

## 2017-06-07 DIAGNOSIS — I5032 Chronic diastolic (congestive) heart failure: Secondary | ICD-10-CM | POA: Diagnosis not present

## 2017-06-07 DIAGNOSIS — M79651 Pain in right thigh: Secondary | ICD-10-CM | POA: Diagnosis not present

## 2017-06-12 DIAGNOSIS — N183 Chronic kidney disease, stage 3 (moderate): Secondary | ICD-10-CM | POA: Diagnosis not present

## 2017-06-12 DIAGNOSIS — I130 Hypertensive heart and chronic kidney disease with heart failure and stage 1 through stage 4 chronic kidney disease, or unspecified chronic kidney disease: Secondary | ICD-10-CM | POA: Diagnosis not present

## 2017-06-12 DIAGNOSIS — I5032 Chronic diastolic (congestive) heart failure: Secondary | ICD-10-CM | POA: Diagnosis not present

## 2017-06-12 DIAGNOSIS — I2510 Atherosclerotic heart disease of native coronary artery without angina pectoris: Secondary | ICD-10-CM | POA: Diagnosis not present

## 2017-06-12 DIAGNOSIS — E1122 Type 2 diabetes mellitus with diabetic chronic kidney disease: Secondary | ICD-10-CM | POA: Diagnosis not present

## 2017-06-12 DIAGNOSIS — M6281 Muscle weakness (generalized): Secondary | ICD-10-CM | POA: Diagnosis not present

## 2017-06-13 DIAGNOSIS — N183 Chronic kidney disease, stage 3 (moderate): Secondary | ICD-10-CM | POA: Diagnosis not present

## 2017-06-13 DIAGNOSIS — I130 Hypertensive heart and chronic kidney disease with heart failure and stage 1 through stage 4 chronic kidney disease, or unspecified chronic kidney disease: Secondary | ICD-10-CM | POA: Diagnosis not present

## 2017-06-13 DIAGNOSIS — I5032 Chronic diastolic (congestive) heart failure: Secondary | ICD-10-CM | POA: Diagnosis not present

## 2017-06-13 DIAGNOSIS — I2510 Atherosclerotic heart disease of native coronary artery without angina pectoris: Secondary | ICD-10-CM | POA: Diagnosis not present

## 2017-06-13 DIAGNOSIS — M6281 Muscle weakness (generalized): Secondary | ICD-10-CM | POA: Diagnosis not present

## 2017-06-13 DIAGNOSIS — E1122 Type 2 diabetes mellitus with diabetic chronic kidney disease: Secondary | ICD-10-CM | POA: Diagnosis not present

## 2017-06-22 DIAGNOSIS — I130 Hypertensive heart and chronic kidney disease with heart failure and stage 1 through stage 4 chronic kidney disease, or unspecified chronic kidney disease: Secondary | ICD-10-CM | POA: Diagnosis not present

## 2017-06-22 DIAGNOSIS — N183 Chronic kidney disease, stage 3 (moderate): Secondary | ICD-10-CM | POA: Diagnosis not present

## 2017-06-22 DIAGNOSIS — I5032 Chronic diastolic (congestive) heart failure: Secondary | ICD-10-CM | POA: Diagnosis not present

## 2017-06-22 DIAGNOSIS — M6281 Muscle weakness (generalized): Secondary | ICD-10-CM | POA: Diagnosis not present

## 2017-06-22 DIAGNOSIS — I2510 Atherosclerotic heart disease of native coronary artery without angina pectoris: Secondary | ICD-10-CM | POA: Diagnosis not present

## 2017-06-22 DIAGNOSIS — E1122 Type 2 diabetes mellitus with diabetic chronic kidney disease: Secondary | ICD-10-CM | POA: Diagnosis not present

## 2017-06-27 DIAGNOSIS — I5032 Chronic diastolic (congestive) heart failure: Secondary | ICD-10-CM | POA: Diagnosis not present

## 2017-06-27 DIAGNOSIS — M6281 Muscle weakness (generalized): Secondary | ICD-10-CM | POA: Diagnosis not present

## 2017-06-27 DIAGNOSIS — I130 Hypertensive heart and chronic kidney disease with heart failure and stage 1 through stage 4 chronic kidney disease, or unspecified chronic kidney disease: Secondary | ICD-10-CM | POA: Diagnosis not present

## 2017-06-27 DIAGNOSIS — N183 Chronic kidney disease, stage 3 (moderate): Secondary | ICD-10-CM | POA: Diagnosis not present

## 2017-06-27 DIAGNOSIS — E1122 Type 2 diabetes mellitus with diabetic chronic kidney disease: Secondary | ICD-10-CM | POA: Diagnosis not present

## 2017-06-27 DIAGNOSIS — I2510 Atherosclerotic heart disease of native coronary artery without angina pectoris: Secondary | ICD-10-CM | POA: Diagnosis not present

## 2017-07-09 DIAGNOSIS — I5032 Chronic diastolic (congestive) heart failure: Secondary | ICD-10-CM | POA: Diagnosis not present

## 2017-07-09 DIAGNOSIS — N183 Chronic kidney disease, stage 3 (moderate): Secondary | ICD-10-CM | POA: Diagnosis not present

## 2017-07-09 DIAGNOSIS — E1165 Type 2 diabetes mellitus with hyperglycemia: Secondary | ICD-10-CM | POA: Diagnosis not present

## 2017-07-09 DIAGNOSIS — J449 Chronic obstructive pulmonary disease, unspecified: Secondary | ICD-10-CM | POA: Diagnosis not present

## 2017-07-09 DIAGNOSIS — F0390 Unspecified dementia without behavioral disturbance: Secondary | ICD-10-CM | POA: Diagnosis not present

## 2017-07-16 DIAGNOSIS — Z23 Encounter for immunization: Secondary | ICD-10-CM | POA: Diagnosis not present

## 2017-08-07 ENCOUNTER — Ambulatory Visit: Payer: Medicare Other | Admitting: Podiatry

## 2017-08-09 DIAGNOSIS — N390 Urinary tract infection, site not specified: Secondary | ICD-10-CM | POA: Diagnosis not present

## 2017-08-09 DIAGNOSIS — I5032 Chronic diastolic (congestive) heart failure: Secondary | ICD-10-CM | POA: Diagnosis not present

## 2017-08-09 DIAGNOSIS — E1165 Type 2 diabetes mellitus with hyperglycemia: Secondary | ICD-10-CM | POA: Diagnosis not present

## 2017-08-09 DIAGNOSIS — N183 Chronic kidney disease, stage 3 (moderate): Secondary | ICD-10-CM | POA: Diagnosis not present

## 2017-08-09 DIAGNOSIS — J449 Chronic obstructive pulmonary disease, unspecified: Secondary | ICD-10-CM | POA: Diagnosis not present

## 2017-09-14 DIAGNOSIS — N390 Urinary tract infection, site not specified: Secondary | ICD-10-CM | POA: Diagnosis not present

## 2017-09-14 DIAGNOSIS — J449 Chronic obstructive pulmonary disease, unspecified: Secondary | ICD-10-CM | POA: Diagnosis not present

## 2017-09-14 DIAGNOSIS — I10 Essential (primary) hypertension: Secondary | ICD-10-CM | POA: Diagnosis not present

## 2017-09-14 DIAGNOSIS — E1165 Type 2 diabetes mellitus with hyperglycemia: Secondary | ICD-10-CM | POA: Diagnosis not present

## 2017-09-14 DIAGNOSIS — I5032 Chronic diastolic (congestive) heart failure: Secondary | ICD-10-CM | POA: Diagnosis not present

## 2017-10-18 DIAGNOSIS — J449 Chronic obstructive pulmonary disease, unspecified: Secondary | ICD-10-CM | POA: Diagnosis not present

## 2017-10-18 DIAGNOSIS — I10 Essential (primary) hypertension: Secondary | ICD-10-CM | POA: Diagnosis not present

## 2017-10-18 DIAGNOSIS — E1165 Type 2 diabetes mellitus with hyperglycemia: Secondary | ICD-10-CM | POA: Diagnosis not present

## 2017-10-18 DIAGNOSIS — I5032 Chronic diastolic (congestive) heart failure: Secondary | ICD-10-CM | POA: Diagnosis not present

## 2017-10-18 DIAGNOSIS — N390 Urinary tract infection, site not specified: Secondary | ICD-10-CM | POA: Diagnosis not present

## 2017-12-17 DIAGNOSIS — D631 Anemia in chronic kidney disease: Secondary | ICD-10-CM | POA: Diagnosis not present

## 2017-12-17 DIAGNOSIS — N183 Chronic kidney disease, stage 3 (moderate): Secondary | ICD-10-CM | POA: Diagnosis not present

## 2018-01-03 DIAGNOSIS — I10 Essential (primary) hypertension: Secondary | ICD-10-CM | POA: Diagnosis not present

## 2018-01-03 DIAGNOSIS — I5032 Chronic diastolic (congestive) heart failure: Secondary | ICD-10-CM | POA: Diagnosis not present

## 2018-01-03 DIAGNOSIS — N390 Urinary tract infection, site not specified: Secondary | ICD-10-CM | POA: Diagnosis not present

## 2018-01-03 DIAGNOSIS — E1165 Type 2 diabetes mellitus with hyperglycemia: Secondary | ICD-10-CM | POA: Diagnosis not present

## 2018-02-28 DIAGNOSIS — I10 Essential (primary) hypertension: Secondary | ICD-10-CM | POA: Diagnosis not present

## 2018-02-28 DIAGNOSIS — N183 Chronic kidney disease, stage 3 (moderate): Secondary | ICD-10-CM | POA: Diagnosis not present

## 2018-02-28 DIAGNOSIS — J449 Chronic obstructive pulmonary disease, unspecified: Secondary | ICD-10-CM | POA: Diagnosis not present

## 2018-02-28 DIAGNOSIS — E1165 Type 2 diabetes mellitus with hyperglycemia: Secondary | ICD-10-CM | POA: Diagnosis not present

## 2018-04-12 ENCOUNTER — Encounter: Payer: Self-pay | Admitting: Podiatry

## 2018-04-12 ENCOUNTER — Ambulatory Visit (INDEPENDENT_AMBULATORY_CARE_PROVIDER_SITE_OTHER): Payer: Medicare Other | Admitting: Podiatry

## 2018-04-12 DIAGNOSIS — R234 Changes in skin texture: Secondary | ICD-10-CM

## 2018-04-12 DIAGNOSIS — B351 Tinea unguium: Secondary | ICD-10-CM

## 2018-04-12 DIAGNOSIS — E1142 Type 2 diabetes mellitus with diabetic polyneuropathy: Secondary | ICD-10-CM

## 2018-04-12 DIAGNOSIS — M79676 Pain in unspecified toe(s): Secondary | ICD-10-CM

## 2018-04-12 NOTE — Progress Notes (Signed)
Patient ID: Angelica Beck, female   DOB: [birth year], 67 y.o.   MRN: [id number] Complaint:  Visit Type: Patient returns to my office for continued preventative foot care services. Complaint: Patient states" my nails have grown long and thick and become painful to walk and wear shoes" Patient has been diagnosed with DM  The patient presents for preventative foot care services. No changes to ROS.  Patient has not been seen for over 11 months. Patient has had a major medical event.  Patient presents to the office with her daughter.  Podiatric Exam: Vascular: dorsalis pedis and posterior tibial pulses are palpable bilateral. Capillary return is immediate. Temperature gradient is WNL. Skin turgor WNL  Sensorium: Normal Semmes Weinstein monofilament test. Normal tactile sensation bilaterally. Nail Exam: Pt has thick disfigured discolored nails with subungual debris noted bilateral entire nail hallux through fifth toenails Ulcer Exam: There is no evidence of ulcer or pre-ulcerative changes or infection. Orthopedic Exam: Muscle tone and strength are WNL. No limitations in general ROM. No crepitus or effusions noted. Foot type and digits show no abnormalities. Bony prominences are unremarkable. Skin: No Porokeratosis. No infection or ulcers.  Skin desquamation both dorsally and plantarly both feet.  Diagnosis:  Onychomycosis, , Pain in right toe, pain in left toes  Treatment & Plan Procedures and Treatment: Consent by patient was obtained for treatment procedures. The patient understood the discussion of treatment and procedures well. All questions were answered thoroughly reviewed. Debridement of mycotic and hypertrophic toenails, 1 through 5 bilateral and clearing of subungual debris. No ulceration, no infection noted.  Return Visit-Office Procedure: Patient instructed to return to the office for a follow up visit 3 months for continued evaluation and treatment.   Gardiner Barefoot DPM

## 2018-04-16 DIAGNOSIS — N183 Chronic kidney disease, stage 3 (moderate): Secondary | ICD-10-CM | POA: Diagnosis not present

## 2018-04-16 DIAGNOSIS — E1165 Type 2 diabetes mellitus with hyperglycemia: Secondary | ICD-10-CM | POA: Diagnosis not present

## 2018-04-16 DIAGNOSIS — J449 Chronic obstructive pulmonary disease, unspecified: Secondary | ICD-10-CM | POA: Diagnosis not present

## 2018-04-16 DIAGNOSIS — N390 Urinary tract infection, site not specified: Secondary | ICD-10-CM | POA: Diagnosis not present

## 2018-05-02 ENCOUNTER — Other Ambulatory Visit: Payer: Self-pay

## 2018-05-02 ENCOUNTER — Encounter (HOSPITAL_COMMUNITY): Payer: Self-pay | Admitting: Emergency Medicine

## 2018-05-02 ENCOUNTER — Inpatient Hospital Stay (HOSPITAL_COMMUNITY)
Admission: EM | Admit: 2018-05-02 | Discharge: 2018-05-09 | DRG: 291 | Disposition: A | Discharge status: Home Health | Payer: Medicare Other | Attending: Internal Medicine | Admitting: Internal Medicine

## 2018-05-02 DIAGNOSIS — J9601 Acute respiratory failure with hypoxia: Secondary | ICD-10-CM | POA: Diagnosis not present

## 2018-05-02 DIAGNOSIS — N183 Chronic kidney disease, stage 3 unspecified: Secondary | ICD-10-CM | POA: Diagnosis present

## 2018-05-02 DIAGNOSIS — R4189 Other symptoms and signs involving cognitive functions and awareness: Secondary | ICD-10-CM | POA: Diagnosis present

## 2018-05-02 DIAGNOSIS — J45909 Unspecified asthma, uncomplicated: Secondary | ICD-10-CM | POA: Diagnosis present

## 2018-05-02 DIAGNOSIS — Z8673 Personal history of transient ischemic attack (TIA), and cerebral infarction without residual deficits: Secondary | ICD-10-CM

## 2018-05-02 DIAGNOSIS — F0150 Vascular dementia without behavioral disturbance: Secondary | ICD-10-CM | POA: Diagnosis present

## 2018-05-02 DIAGNOSIS — E782 Mixed hyperlipidemia: Secondary | ICD-10-CM | POA: Diagnosis not present

## 2018-05-02 DIAGNOSIS — E1122 Type 2 diabetes mellitus with diabetic chronic kidney disease: Secondary | ICD-10-CM | POA: Diagnosis present

## 2018-05-02 DIAGNOSIS — K921 Melena: Secondary | ICD-10-CM | POA: Diagnosis present

## 2018-05-02 DIAGNOSIS — G9341 Metabolic encephalopathy: Secondary | ICD-10-CM | POA: Diagnosis present

## 2018-05-02 DIAGNOSIS — E0500 Thyrotoxicosis with diffuse goiter without thyrotoxic crisis or storm: Secondary | ICD-10-CM | POA: Diagnosis present

## 2018-05-02 DIAGNOSIS — I130 Hypertensive heart and chronic kidney disease with heart failure and stage 1 through stage 4 chronic kidney disease, or unspecified chronic kidney disease: Principal | ICD-10-CM | POA: Diagnosis present

## 2018-05-02 DIAGNOSIS — I69351 Hemiplegia and hemiparesis following cerebral infarction affecting right dominant side: Secondary | ICD-10-CM | POA: Diagnosis not present

## 2018-05-02 DIAGNOSIS — E0590 Thyrotoxicosis, unspecified without thyrotoxic crisis or storm: Secondary | ICD-10-CM | POA: Diagnosis not present

## 2018-05-02 DIAGNOSIS — Z7982 Long term (current) use of aspirin: Secondary | ICD-10-CM

## 2018-05-02 DIAGNOSIS — E039 Hypothyroidism, unspecified: Secondary | ICD-10-CM | POA: Diagnosis present

## 2018-05-02 DIAGNOSIS — R0603 Acute respiratory distress: Secondary | ICD-10-CM

## 2018-05-02 DIAGNOSIS — R001 Bradycardia, unspecified: Secondary | ICD-10-CM | POA: Clinically undetermined

## 2018-05-02 DIAGNOSIS — F0280 Dementia in other diseases classified elsewhere without behavioral disturbance: Secondary | ICD-10-CM | POA: Diagnosis present

## 2018-05-02 DIAGNOSIS — J90 Pleural effusion, not elsewhere classified: Secondary | ICD-10-CM | POA: Diagnosis not present

## 2018-05-02 DIAGNOSIS — I358 Other nonrheumatic aortic valve disorders: Secondary | ICD-10-CM | POA: Diagnosis present

## 2018-05-02 DIAGNOSIS — K5641 Fecal impaction: Secondary | ICD-10-CM | POA: Diagnosis present

## 2018-05-02 DIAGNOSIS — G9340 Encephalopathy, unspecified: Secondary | ICD-10-CM | POA: Diagnosis not present

## 2018-05-02 DIAGNOSIS — Z9981 Dependence on supplemental oxygen: Secondary | ICD-10-CM | POA: Diagnosis not present

## 2018-05-02 DIAGNOSIS — I10 Essential (primary) hypertension: Secondary | ICD-10-CM | POA: Diagnosis not present

## 2018-05-02 DIAGNOSIS — J9621 Acute and chronic respiratory failure with hypoxia: Secondary | ICD-10-CM | POA: Diagnosis present

## 2018-05-02 DIAGNOSIS — R404 Transient alteration of awareness: Secondary | ICD-10-CM | POA: Diagnosis not present

## 2018-05-02 DIAGNOSIS — N289 Disorder of kidney and ureter, unspecified: Secondary | ICD-10-CM

## 2018-05-02 DIAGNOSIS — N950 Postmenopausal bleeding: Secondary | ICD-10-CM | POA: Diagnosis present

## 2018-05-02 DIAGNOSIS — I129 Hypertensive chronic kidney disease with stage 1 through stage 4 chronic kidney disease, or unspecified chronic kidney disease: Secondary | ICD-10-CM

## 2018-05-02 DIAGNOSIS — I5043 Acute on chronic combined systolic (congestive) and diastolic (congestive) heart failure: Secondary | ICD-10-CM | POA: Diagnosis present

## 2018-05-02 DIAGNOSIS — G20 Parkinson's disease: Secondary | ICD-10-CM | POA: Diagnosis present

## 2018-05-02 DIAGNOSIS — Z87891 Personal history of nicotine dependence: Secondary | ICD-10-CM

## 2018-05-02 DIAGNOSIS — K625 Hemorrhage of anus and rectum: Secondary | ICD-10-CM | POA: Diagnosis not present

## 2018-05-02 DIAGNOSIS — Z794 Long term (current) use of insulin: Secondary | ICD-10-CM

## 2018-05-02 DIAGNOSIS — I509 Heart failure, unspecified: Secondary | ICD-10-CM | POA: Insufficient documentation

## 2018-05-02 DIAGNOSIS — Z880 Allergy status to penicillin: Secondary | ICD-10-CM

## 2018-05-02 DIAGNOSIS — E662 Morbid (severe) obesity with alveolar hypoventilation: Secondary | ICD-10-CM | POA: Diagnosis present

## 2018-05-02 DIAGNOSIS — K922 Gastrointestinal hemorrhage, unspecified: Secondary | ICD-10-CM | POA: Diagnosis present

## 2018-05-02 DIAGNOSIS — N939 Abnormal uterine and vaginal bleeding, unspecified: Secondary | ICD-10-CM | POA: Diagnosis not present

## 2018-05-02 DIAGNOSIS — Z7401 Bed confinement status: Secondary | ICD-10-CM | POA: Diagnosis not present

## 2018-05-02 DIAGNOSIS — E785 Hyperlipidemia, unspecified: Secondary | ICD-10-CM | POA: Diagnosis present

## 2018-05-02 DIAGNOSIS — I2510 Atherosclerotic heart disease of native coronary artery without angina pectoris: Secondary | ICD-10-CM | POA: Diagnosis not present

## 2018-05-02 DIAGNOSIS — I359 Nonrheumatic aortic valve disorder, unspecified: Secondary | ICD-10-CM | POA: Diagnosis not present

## 2018-05-02 DIAGNOSIS — I5033 Acute on chronic diastolic (congestive) heart failure: Secondary | ICD-10-CM | POA: Diagnosis present

## 2018-05-02 DIAGNOSIS — Z6836 Body mass index (BMI) 36.0-36.9, adult: Secondary | ICD-10-CM

## 2018-05-02 DIAGNOSIS — D259 Leiomyoma of uterus, unspecified: Secondary | ICD-10-CM | POA: Diagnosis not present

## 2018-05-02 DIAGNOSIS — I5030 Unspecified diastolic (congestive) heart failure: Secondary | ICD-10-CM | POA: Diagnosis not present

## 2018-05-02 DIAGNOSIS — D649 Anemia, unspecified: Secondary | ICD-10-CM | POA: Diagnosis present

## 2018-05-02 DIAGNOSIS — I340 Nonrheumatic mitral (valve) insufficiency: Secondary | ICD-10-CM | POA: Diagnosis not present

## 2018-05-02 DIAGNOSIS — R0902 Hypoxemia: Secondary | ICD-10-CM | POA: Diagnosis not present

## 2018-05-02 DIAGNOSIS — R7989 Other specified abnormal findings of blood chemistry: Secondary | ICD-10-CM | POA: Diagnosis present

## 2018-05-02 DIAGNOSIS — R931 Abnormal findings on diagnostic imaging of heart and coronary circulation: Secondary | ICD-10-CM

## 2018-05-02 DIAGNOSIS — Z79899 Other long term (current) drug therapy: Secondary | ICD-10-CM

## 2018-05-02 DIAGNOSIS — I5022 Chronic systolic (congestive) heart failure: Secondary | ICD-10-CM | POA: Diagnosis not present

## 2018-05-02 DIAGNOSIS — R0602 Shortness of breath: Secondary | ICD-10-CM | POA: Diagnosis not present

## 2018-05-02 DIAGNOSIS — R9389 Abnormal findings on diagnostic imaging of other specified body structures: Secondary | ICD-10-CM | POA: Diagnosis not present

## 2018-05-02 DIAGNOSIS — IMO0001 Reserved for inherently not codable concepts without codable children: Secondary | ICD-10-CM | POA: Diagnosis present

## 2018-05-02 DIAGNOSIS — R58 Hemorrhage, not elsewhere classified: Secondary | ICD-10-CM | POA: Diagnosis not present

## 2018-05-02 LAB — DIFFERENTIAL
Abs Immature Granulocytes: 0 10*3/uL (ref 0.0–0.1)
Basophils Absolute: 0.1 10*3/uL (ref 0.0–0.1)
Basophils Relative: 1 %
Eosinophils Absolute: 0.3 10*3/uL (ref 0.0–0.7)
Eosinophils Relative: 4 %
Immature Granulocytes: 0 %
LYMPHS PCT: 20 %
Lymphs Abs: 2 10*3/uL (ref 0.7–4.0)
MONOS PCT: 7 %
Monocytes Absolute: 0.7 10*3/uL (ref 0.1–1.0)
NEUTROS ABS: 6.7 10*3/uL (ref 1.7–7.7)
Neutrophils Relative %: 68 %

## 2018-05-02 LAB — URINALYSIS, ROUTINE W REFLEX MICROSCOPIC
BILIRUBIN URINE: NEGATIVE
Glucose, UA: NEGATIVE mg/dL
Ketones, ur: NEGATIVE mg/dL
Leukocytes, UA: NEGATIVE
NITRITE: NEGATIVE
PH: 5 (ref 5.0–8.0)
Protein, ur: 100 mg/dL — AB
RBC / HPF: >50 RBC/hpf — ABNORMAL HIGH (ref 0–5)
SPECIFIC GRAVITY, URINE: 1.012 (ref 1.005–1.030)

## 2018-05-02 LAB — CBC
HEMATOCRIT: 38.5 % (ref 36.0–46.0)
HEMATOCRIT: 37.8 % (ref 36.0–46.0)
HEMOGLOBIN: 11.4 g/dL — AB (ref 12.0–15.0)
Hemoglobin: 11.6 g/dL — ABNORMAL LOW (ref 12.0–15.0)
MCH: 25.9 pg — ABNORMAL LOW (ref 26.0–34.0)
MCH: 26.1 pg (ref 26.0–34.0)
MCHC: 29.6 g/dL — AB (ref 30.0–36.0)
MCHC: 30.7 g/dL (ref 30.0–36.0)
MCV: 87.5 fL (ref 78.0–100.0)
MCV: 84.9 fL (ref 78.0–100.0)
Platelets: 255 10*3/uL (ref 150–400)
Platelets: 246 10*3/uL (ref 150–400)
RBC: 4.4 MIL/uL (ref 3.87–5.11)
RBC: 4.45 MIL/uL (ref 3.87–5.11)
RDW: 14.6 % (ref 11.5–15.5)
RDW: 14.6 % (ref 11.5–15.5)
WBC: 9.8 10*3/uL (ref 4.0–10.5)
WBC: 12 10*3/uL — AB (ref 4.0–10.5)

## 2018-05-02 LAB — COMPREHENSIVE METABOLIC PANEL
ALBUMIN: 3.1 g/dL — AB (ref 3.5–5.0)
ALT: 11 U/L (ref 0–44)
ANION GAP: 12 (ref 5–15)
AST: 20 U/L (ref 15–41)
Alkaline Phosphatase: 123 U/L (ref 38–126)
BILIRUBIN TOTAL: 0.7 mg/dL (ref 0.3–1.2)
BUN: 22 mg/dL (ref 8–23)
CHLORIDE: 100 mmol/L (ref 98–111)
CO2: 28 mmol/L (ref 22–32)
Calcium: 9.9 mg/dL (ref 8.9–10.3)
Creatinine, Ser: 1.64 mg/dL — ABNORMAL HIGH (ref 0.44–1.00)
GFR calc Af Amer: 37 mL/min — ABNORMAL LOW (ref >60)
GFR calc non Af Amer: 32 mL/min — ABNORMAL LOW (ref >60)
GLUCOSE: 129 mg/dL — AB (ref 70–99)
POTASSIUM: 4.1 mmol/L (ref 3.5–5.1)
SODIUM: 140 mmol/L (ref 135–145)
TOTAL PROTEIN: 7.3 g/dL (ref 6.5–8.1)

## 2018-05-02 LAB — GLUCOSE, CAPILLARY
GLUCOSE-CAPILLARY: 102 mg/dL — AB (ref 70–99)
Glucose-Capillary: 53 mg/dL — ABNORMAL LOW (ref 70–99)
Glucose-Capillary: 75 mg/dL (ref 70–99)
Glucose-Capillary: 125 mg/dL — ABNORMAL HIGH (ref 70–99)
Glucose-Capillary: 164 mg/dL — ABNORMAL HIGH (ref 70–99)

## 2018-05-02 LAB — TYPE AND SCREEN
ABO/RH(D): O POS
ANTIBODY SCREEN: NEGATIVE

## 2018-05-02 LAB — POC OCCULT BLOOD, ED: Fecal Occult Bld: POSITIVE — AB

## 2018-05-02 MED ORDER — SORBITOL 70 % SOLN
960.0000 mL | TOPICAL_OIL | Freq: Once | ORAL | Status: DC
  Filled 2018-05-02 (×4): qty 473

## 2018-05-02 MED ORDER — GLUCOSE 40 % PO GEL
ORAL | Status: AC
  Administered 2018-05-02: 37.5 g
  Filled 2018-05-02: qty 1

## 2018-05-02 MED ORDER — BUDESONIDE 0.5 MG/2ML IN SUSP
0.5000 mg | Freq: Two times a day (BID) | RESPIRATORY_TRACT | Status: DC
  Administered 2018-05-02 – 2018-05-09 (×12): 0.5 mg via RESPIRATORY_TRACT
  Filled 2018-05-02 (×14): qty 2

## 2018-05-02 MED ORDER — DEXTROSE 50 % IV SOLN
INTRAVENOUS | Status: AC
  Filled 2018-05-02: qty 50

## 2018-05-02 MED ORDER — INSULIN ASPART 100 UNIT/ML ~~LOC~~ SOLN
0.0000 [IU] | Freq: Three times a day (TID) | SUBCUTANEOUS | Status: DC
  Administered 2018-05-03: 1 [IU] via SUBCUTANEOUS
  Administered 2018-05-04 (×2): 3 [IU] via SUBCUTANEOUS
  Administered 2018-05-05 (×2): 2 [IU] via SUBCUTANEOUS
  Administered 2018-05-06 (×2): 1 [IU] via SUBCUTANEOUS
  Administered 2018-05-07 – 2018-05-08 (×3): 2 [IU] via SUBCUTANEOUS
  Administered 2018-05-08 – 2018-05-09 (×2): 3 [IU] via SUBCUTANEOUS
  Administered 2018-05-09: 1 [IU] via SUBCUTANEOUS

## 2018-05-02 MED ORDER — HYDRALAZINE HCL 20 MG/ML IJ SOLN: 5.0000 mg | Freq: Four times a day (QID) | INTRAMUSCULAR | Status: DC | PRN

## 2018-05-02 MED ORDER — SODIUM CHLORIDE 0.9 % IV SOLN
INTRAVENOUS | Status: AC
  Administered 2018-05-02 (×2): via INTRAVENOUS

## 2018-05-02 MED ORDER — METHIMAZOLE 10 MG PO TABS
10.0000 mg | ORAL_TABLET | Freq: Every day | ORAL | Status: DC
  Administered 2018-05-02 – 2018-05-09 (×8): 10 mg via ORAL
  Filled 2018-05-02 (×9): qty 1

## 2018-05-02 NOTE — Progress Notes (Signed)
Pt arrived to the unit at approximately 8:25 this AM, via stretcher.  She was transferred from the stretcher to the bed.  Pt was able to answer questions with yes/no response.  She denied any pain.  She did ask where members of her family were.

## 2018-05-02 NOTE — ED Notes (Signed)
Report given to 6N

## 2018-05-02 NOTE — Consult Note (Addendum)
Referring Provider: [facility]  Primary Care Physician:  Clovia Cuff, MD Primary Gastroenterologist:  unassigned  Reason for Consultation:   Rectal bleeding  ASSESSMENT AND PLAN:    1. 67 yo bedbound female with multiple medical problems admitted with painless rectal bleeding. She is impacted with stool on exam so could have stercoral ulcer vrs diverticular (seems unlikely with the volume of bleeding) vrs internal hemorrhoid, neoplasm. . -first need to disimpact and then reevaluate the rectal bleeding. Will order enemas.   2. Mild chronic normocytic anemia. Hgb above baseline, maybe volume depletion.  -monitor hgb, sounds like bleeding low volume overall.    Attending physician's note   I have taken an interval history (not much reliable), reviewed the chart and examined the patient. I agree with the Advanced Practitioner's note, impression and recommendations.   67 year old with multiple medical problems with painless hematochezia.  Found to be impacted.  Hemoglobin stable. Plan: Enemas, serial x-ray KUBs, if no improvement, may need manual disimpaction.  Would like to hold off on any endoscopic procedures at this time due to difficulty in preparation.  Patient is bedbound.  Carmell Austria, MD    HPI: Angelica Beck is a 67 y.o. female with multiple medical problems not limited to DM, hx of CVA with hemiparesis, vascular dementia. brought by EMS to ED during the night for evaluation of rectal bleeding. Patient bed bound, blood found in underpants by daughter. She has chronic anemia, hgb is 11.4 which is higher than her baseline so ? Dehydration. Patient has a hx of dementia. She gives one word answers to questions, so history limited but she does endorse hx of constipation. She denies abdominal pain .     Past Medical History:  Diagnosis Date  . Arthritis   . Asthma    09/07/16  . Diabetes mellitus, type 2 ([place])   . Diffuse goiter   . Hyperlipidemia   . Hypertension     . Hyperthyroidism   . Obesity hypoventilation syndrome ([place]) 06/20/2013  . Stroke ([place])   . Vascular dementia     Past Surgical History:  Procedure Laterality Date  . IR GENERIC HISTORICAL  07/12/2016   IR US GUIDE VASC ACCESS RIGHT 07/12/2016 [name], MD [facility]  . IR GENERIC HISTORICAL  07/12/2016   IR FLUORO GUIDE CV LINE RIGHT 07/12/2016 [name], MD [facility]  . IR GENERIC HISTORICAL  07/17/2016   IR GASTROSTOMY TUBE MOD SED 07/17/2016 [name], MD [facility]  . IR GENERIC HISTORICAL  08/23/2016   IR REMOVAL TUN CV CATH W/O FL 08/23/2016 [name], MD [facility]  . IR GENERIC HISTORICAL  11/09/2016   IR GASTROSTOMY TUBE REMOVAL 11/09/2016 [name], PA-C [facility]  . TRACHEOSTOMY TUBE PLACEMENT N/A 07/14/2016   Procedure: TRACHEOSTOMY, THYROID ISTHMUSECTOMY;  Surgeon: [name], MD;  Location: [facility];  Service: ENT;  Laterality: N/A;  . [place]      Prior to Admission medications   Medication Sig Start Date End Date Taking? Authorizing Provider  acetaminophen (TYLENOL) 500 MG tablet Take 500 mg by mouth every 6 (six) hours as needed for mild pain.    Yes [provider]  aspirin 81 MG chewable tablet Chew 81 mg by mouth daily.   Yes [provider]  atorvastatin (LIPITOR) 40 MG tablet Take 40 mg by mouth every evening.    Yes [provider]  b complex-vitamin c-folic acid (NEPHRO-VITE) 0.8 MG TABS tablet Take 1 tablet by mouth  every morning.   Yes [provider]  budesonide (PULMICORT) 0.5 MG/2ML nebulizer solution Take 0.5 mg by nebulization 2 (two) times daily.   Yes [provider]  cholecalciferol (VITAMIN D) 1000 units tablet Take 1,000 Units by mouth daily.   Yes [provider]  docusate sodium (COLACE) 100 MG capsule Take 100 mg by mouth daily.    Yes [provider]  famotidine (PEPCID) 20 MG tablet Take 20 mg by mouth daily.    Yes [provider]  furosemide (LASIX) 20 MG tablet Take 20 mg by mouth daily.   Yes [provider]  insulin detemir (LEVEMIR) 100 UNIT/ML injection Inject 10 Units into the skin at bedtime.    Yes [provider]  ipratropium-albuterol (DUONEB) 0.5-2.5 (3) MG/3ML SOLN Take 3 mLs by nebulization every 4 (four) hours as needed (for dyspnea).   Yes [provider]  Melatonin 3 MG TABS Take 3 mg by mouth at bedtime.    Yes [provider]  memantine (NAMENDA) 10 MG tablet Take 10 mg by mouth 2 (two) times daily.    Yes [provider]  methimazole (TAPAZOLE) 10 MG tablet Take 10 mg by mouth daily.    Yes [provider]  metoprolol (LOPRESSOR) 50 MG tablet Take 50 mg by mouth 2 (two) times daily.   Yes [provider]  ondansetron (ZOFRAN) 4 MG tablet Take 4 mg by mouth every 8 (eight) hours as needed for nausea or vomiting.   Yes [provider]  potassium chloride SA (K-DUR,KLOR-CON) 20 MEQ tablet Take 20 mEq by mouth every Monday, Wednesday, and Friday.   Yes [provider]    Current Facility-Administered Medications  Medication Dose Route Frequency Provider Last Rate Last Dose  . 0.9 %  sodium chloride infusion   Intravenous Continuous Georgette Shell, MD      . budesonide (PULMICORT) nebulizer solution 0.5 mg  0.5 mg Nebulization BID Georgette Shell, MD      . insulin aspart (novoLOG) injection 0-9 Units  0-9 Units Subcutaneous TID WC Georgette Shell, MD      . methimazole (TAPAZOLE) tablet 10 mg  10 mg Oral Daily Georgette Shell, MD        Allergies as of 05/02/2018 - Review Complete 05/02/2018  Allergen Reaction Noted  . Penicillins Swelling 06/28/2009  . Lactose intolerance (gi) Diarrhea and Nausea And Vomiting 08/11/2015    Family History  Problem Relation Age of Onset  . Diabetes Mother   . Cancer Neg Hx   . Heart disease Neg Hx   . Stroke Neg Hx     Social History    Socioeconomic History  . Marital status: Single    Spouse name: Not on file  . Number of children: Not on file  . Years of education: Not on file  . Highest education level: Not on file  Occupational History  . Not on file  Social Needs  . Financial resource strain: Not on file  . Food insecurity:    Worry: Not on file    Inability: Not on file  . Transportation needs:    Medical: Not on file    Non-medical: Not on file  Tobacco Use  . Smoking status: Former Smoker    Packs/day: 0.25    Years: 30.00    Pack years: 7.50    Types: Cigarettes    Last attempt to quit: 09/25/2002    Years since quitting: 15.6  .  Smokeless tobacco: Never Used  Substance and Sexual Activity  . Alcohol use: No  . Drug use: No  . Sexual activity: Not Currently  Lifestyle  . Physical activity:    Days per week: Not on file    Minutes per session: Not on file  . Stress: Not on file  Relationships  . Social connections:    Talks on phone: Not on file    Gets together: Not on file    Attends religious service: Not on file    Active member of club or organization: Not on file    Attends meetings of clubs or organizations: Not on file    Relationship status: Not on file  . Intimate partner violence:    Fear of current or ex partner: Not on file    Emotionally abused: Not on file    Physically abused: Not on file    Forced sexual activity: Not on file  Other Topics Concern  . Not on file  Social History Narrative  . Not on file    Review of Systems: All systems reviewed and negative except where noted in HPI.  Physical Exam: Vital signs in last 24 hours: Temp:  [97.9 F (36.6 C)-98.2 F (36.8 C)] 98.2 F (36.8 C) (08/08 0837) Pulse Rate:  [57-76] 70 (08/08 0837) Resp:  [11-17] 14 (08/08 0837) BP: (93-148)/(47-111) 148/81 (08/08 0837) SpO2:  [95 %-100 %] 100 % (08/08 0837) Weight:  [110 kg] 110 kg (08/08 0252)   General:   Alert, obese female in NAD Psych:   cooperative. Flat  affect. Eyes:  Pupils equal, sclera clear, no icterus.   Conjunctiva pink. Ears:  Normal auditory acuity. Nose:  No deformity, discharge,  or lesions. Neck:  Supple; no masses Lungs:  Clear throughout to auscultation.   No wheezes, crackles, or rhonchi.  Heart:  Regular rate and rhythm; no murmurs, no edema Abdomen:  Soft, non-distended, nontender, BS active, no palp mass    Rectal:  No external lesions. Impacted on DRE. No blood on gloved finger after DRE  Msk:  Symmetrical without gross deformities. . Neurologic:  Alert , oriented  Skin:  Intact without significant lesions or rashes..   Intake/Output from previous day: No intake/output data recorded. Intake/Output this shift: No intake/output data recorded.  Lab Results: Recent Labs    05/02/18 0300  WBC 9.8  HGB 11.4*  HCT 38.5  PLT 255   BMET Recent Labs    05/02/18 0300  NA 140  K 4.1  CL 100  CO2 28  GLUCOSE 129*  BUN 22  CREATININE 1.64*  CALCIUM 9.9   LFT Recent Labs    05/02/18 0300  PROT 7.3  ALBUMIN 3.1*  AST 20  ALT 11  ALKPHOS 123  BILITOT 0.7     Tye Savoy, NP-C @  05/02/2018, 9:33 AM

## 2018-05-02 NOTE — Progress Notes (Signed)
Pt had low blood sugar this AM of 53, she was give oral gel per the Protocol - MD notified.  Repeated CBG and blood sugar increased to 75.  Patient monitored, and given PO clear liquids during the day, and tolerating foods well.

## 2018-05-02 NOTE — ED Triage Notes (Addendum)
Per EMS, pt from home with complaints of bright red blood that was found in patients depends this morning by pts daughter. Pt has hx of stroke and is bed bound. Pt has right sided deficients at baseline.

## 2018-05-02 NOTE — H&P (Addendum)
History and Physical    Angelica Beck NAT:[id number] DOB: 1951/02/10 DOA: 05/02/2018  PCP: Clovia Cuff, MD Patient coming from: Home  Chief Complaint: Rectal bleeding  HPI: Angelica Beck is a 67 y.o. female with medical history significant of diabetes, hypertension, hypothyroidism, history of stroke,chf,ckd stage III vascular dementia admitted with complaints of passing clots via rectum 3-4 times since yesterday.  Patient lives at home with her daughter who takes care of her 24 hours.  No history of nausea vomiting hematemesis or hematuria.  Daughter reports that she is never had anything like this in the past no known history of hemorrhoids diverticulitis .  Patient has had a history of bowel obstruction in the past. she denies any fever, chills, cough, abdominal pain.denies any chest pain shortness of breath. Patient has had a history of long hospital stay complicated by small bowel obstruction trach and PEG in the past.  ED Course: Her blood pressure was 148 111 pulse was 57 respirations 17 sats 95% on room air.  Were significant for hemoglobin of 11.4 which is better than before though she appears somewhat dry normal platelets white count 9.8 sodium 140 potassium 4.1 BUN 22 creatinine 1.64 which is her baseline.  Review of Systems: As per HPI otherwise all other systems reviewed and are negative  Ambulatory Status: Patient is wheelchair-bound or bedbound at baseline  Past Medical History:  Diagnosis Date  . Arthritis   . Asthma    09/07/16  . Diabetes mellitus, type 2 ([place])   . Diffuse goiter   . Hyperlipidemia   . Hypertension   . Hyperthyroidism   . Obesity hypoventilation syndrome ([place]) 06/20/2013  . Stroke ([place])   . Vascular dementia     Past Surgical History:  Procedure Laterality Date  . IR GENERIC HISTORICAL  07/12/2016   IR US GUIDE VASC ACCESS RIGHT 07/12/2016 [name], MD [facility]  . IR GENERIC HISTORICAL  07/12/2016   IR FLUORO GUIDE CV LINE  RIGHT 07/12/2016 [name], MD [facility]  . IR GENERIC HISTORICAL  07/17/2016   IR GASTROSTOMY TUBE MOD SED 07/17/2016 [name], MD [facility]  . IR GENERIC HISTORICAL  08/23/2016   IR REMOVAL TUN CV CATH W/O FL 08/23/2016 [name], MD [facility]  . IR GENERIC HISTORICAL  11/09/2016   IR GASTROSTOMY TUBE REMOVAL 11/09/2016 [name], PA-C [facility]  . TRACHEOSTOMY TUBE PLACEMENT N/A 07/14/2016   Procedure: TRACHEOSTOMY, THYROID ISTHMUSECTOMY;  Surgeon: [name], MD;  Location: [facility];  Service: ENT;  Laterality: N/A;  . VENTRAL HERNIA REPAIR      Social History   Socioeconomic History  . Marital status: Single    Spouse name: Not on file  . Number of children: Not on file  . Years of education: Not on file  . Highest education level: Not on file  Occupational History  . Not on file  Social Needs  . Financial resource strain: Not on file  . Food insecurity:    Worry: Not on file    Inability: Not on file  . Transportation needs:    Medical: Not on file    Non-medical: Not on file  Tobacco Use  . Smoking status: Former Smoker    Packs/day: 0.25    Years: 30.00    Pack years: 7.50    Types: Cigarettes    Last attempt to quit: 09/25/2002    Years since quitting: 15.6  . Smokeless tobacco: Never Used  Substance and Sexual Activity  .  Alcohol use: No  . Drug use: No  . Sexual activity: Not Currently  Lifestyle  . Physical activity:    Days per week: Not on file    Minutes per session: Not on file  . Stress: Not on file  Relationships  . Social connections:    Talks on phone: Not on file    Gets together: Not on file    Attends religious service: Not on file    Active member of club or organization: Not on file    Attends meetings of clubs or organizations: Not on file    Relationship status: Not on file  . Intimate partner violence:    Fear of current or ex partner: Not on file    Emotionally abused: Not on file    Physically  abused: Not on file    Forced sexual activity: Not on file  Other Topics Concern  . Not on file  Social History Narrative  . Not on file    Allergies  Allergen Reactions  . Penicillins Swelling    FACIAL SWELLING Has patient had a PCN reaction causing immediate rash, facial/tongue/throat swelling, SOB or lightheadedness with hypotension: Yes Has patient had a PCN reaction causing severe rash involving mucus membranes or skin necrosis: No Has patient had a PCN reaction that required hospitalization: No Has patient had a PCN reaction occurring within the last 10 years: No If all of the above answers are "NO", then may proceed with Cephalosporin use.   . Lactose Intolerance (Gi) Diarrhea and Nausea And Vomiting    Family History  Problem Relation Age of Onset  . Diabetes Mother   . Cancer Neg Hx   . Heart disease Neg Hx   . Stroke Neg Hx       Prior to Admission medications   Medication Sig Start Date End Date Taking? Authorizing Provider  acetaminophen (TYLENOL) 500 MG tablet Take 500 mg by mouth every 6 (six) hours as needed for mild pain.    Yes [provider]  aspirin 81 MG chewable tablet Chew 81 mg by mouth daily.   Yes [provider]  atorvastatin (LIPITOR) 40 MG tablet Take 40 mg by mouth every evening.    Yes [provider]  b complex-vitamin c-folic acid (NEPHRO-VITE) 0.8 MG TABS tablet Take 1 tablet by mouth every morning.   Yes [provider]  budesonide (PULMICORT) 0.5 MG/2ML nebulizer solution Take 0.5 mg by nebulization 2 (two) times daily.   Yes [provider]  cholecalciferol (VITAMIN D) 1000 units tablet Take 1,000 Units by mouth daily.   Yes [provider]  docusate sodium (COLACE) 100 MG capsule Take 100 mg by mouth daily.    Yes [provider]  famotidine (PEPCID) 20 MG tablet Take 20 mg by mouth daily.    Yes [provider]  furosemide (LASIX) 20 MG tablet Take 20 mg by mouth  daily.   Yes [provider]  insulin detemir (LEVEMIR) 100 UNIT/ML injection Inject 10 Units into the skin at bedtime.    Yes [provider]  ipratropium-albuterol (DUONEB) 0.5-2.5 (3) MG/3ML SOLN Take 3 mLs by nebulization every 4 (four) hours as needed (for dyspnea).   Yes [provider]  Melatonin 3 MG TABS Take 3 mg by mouth at bedtime.    Yes [provider]  memantine (NAMENDA) 10 MG tablet Take 10 mg by mouth 2 (two) times daily.    Yes [provider]  methimazole (TAPAZOLE)  10 MG tablet Take 10 mg by mouth daily.    Yes [provider]  metoprolol (LOPRESSOR) 50 MG tablet Take 50 mg by mouth 2 (two) times daily.   Yes [provider]  ondansetron (ZOFRAN) 4 MG tablet Take 4 mg by mouth every 8 (eight) hours as needed for nausea or vomiting.   Yes [provider]  potassium chloride SA (K-DUR,KLOR-CON) 20 MEQ tablet Take 20 mEq by mouth every Monday, Wednesday, and Friday.   Yes [provider]    Physical Exam: Vitals:   05/02/18 0515 05/02/18 0615 05/02/18 0745 05/02/18 0746  BP: 113/78 (!) 119/92  (!) 148/111  Pulse: 64 71 65 (!) 57  Resp:  15 16 17   Temp:      TempSrc:      SpO2: 98% 100% 97% 95%  Weight:      Height:       Patient looks older than her stated age resting in bed in no acute distress.  General: Appears calm and comfortable Eyes: PERRL, EOMI, normal lids, iris ENT: grossly normal hearing, lips & tongue, mmm Neck:  no LAD, masses or thyromegaly Cardiovascular:  RRR, no m/r/g. No LE edema.  Respiratory:  CTA bilaterally, no w/r/r. Normal respiratory effort. Abdomen: soft, ntnd, NABS Skin:  no rash or induration seen on limited exam Musculoskeletal:  grossly normal tone BUE/BLE, good ROM, no bony abnormality Labs on Admission: I have personally reviewed following labs and imaging studies  CBC: Recent Labs  Lab 05/02/18 0300  WBC 9.8  HGB 11.4*  HCT 38.5  MCV 87.5  PLT  703   Basic Metabolic Panel: Recent Labs  Lab 05/02/18 0300  NA 140  K 4.1  CL 100  CO2 28  GLUCOSE 129*  BUN 22  CREATININE 1.64*  CALCIUM 9.9   GFR: Estimated Creatinine Clearance: 44.4 mL/min (A) (by C-G formula based on SCr of 1.64 mg/dL (H)). Liver Function Tests: Recent Labs  Lab 05/02/18 0300  AST 20  ALT 11  ALKPHOS 123  BILITOT 0.7  PROT 7.3  ALBUMIN 3.1*   No results for input(s): LIPASE, AMYLASE in the last 168 hours. No results for input(s): AMMONIA in the last 168 hours. Coagulation Profile: No results for input(s): INR, PROTIME in the last 168 hours. Cardiac Enzymes: No results for input(s): CKTOTAL, CKMB, CKMBINDEX, TROPONINI in the last 168 hours. BNP (last 3 results) No results for input(s): PROBNP in the last 8760 hours. HbA1C: No results for input(s): HGBA1C in the last 72 hours. CBG: No results for input(s): GLUCAP in the last 168 hours. Lipid Profile: No results for input(s): CHOL, HDL, LDLCALC, TRIG, CHOLHDL, LDLDIRECT in the last 72 hours. Thyroid Function Tests: No results for input(s): TSH, T4TOTAL, FREET4, T3FREE, THYROIDAB in the last 72 hours. Anemia Panel: No results for input(s): VITAMINB12, FOLATE, FERRITIN, TIBC, IRON, RETICCTPCT in the last 72 hours. Urine analysis:    Component Value Date/Time   COLORURINE AMBER (A) 05/02/2018 0626   APPEARANCEUR HAZY (A) 05/02/2018 0626   LABSPEC 1.012 05/02/2018 0626   PHURINE 5.0 05/02/2018 0626   GLUCOSEU NEGATIVE 05/02/2018 0626   HGBUR LARGE (A) 05/02/2018 0626   BILIRUBINUR NEGATIVE 05/02/2018 0626   KETONESUR NEGATIVE 05/02/2018 0626   PROTEINUR 100 (A) 05/02/2018 0626   UROBILINOGEN 0.2 08/02/2015 0935   NITRITE NEGATIVE 05/02/2018 0626   LEUKOCYTESUR NEGATIVE 05/02/2018 0626    Creatinine Clearance: Estimated Creatinine Clearance: 44.4 mL/min (A) (by C-G formula based on SCr of 1.64 mg/dL (  H)).  Sepsis Labs: @LABRCNTIP (procalcitonin:4,lacticidven:4) )No results found for  this or any previous visit (from the past 240 hour(s)).   Radiological Exams on Admission: No results found.  EKG: Independently reviewed.  Assessment/Plan Active Problems:   Rectal bleeding   GI bleed   1]rectal bleeding/ lower GI bleeding - rule out neoplasm hemorrhoids diverticulipatient admitted with positive passing multiple clots 3-4 times since last night.  She is found to have acute hemoglobin of 11.4.  She takes aspirin at home which I will hold.  Daughter thought she was on Plavix but I do not see it in the medication list.  Will hydrate her slowly with a history of CHF.  Patient is hemodynamically stable at this time.  Will follow H&H and transfuse as needed.  I have called in a consult [facility] who will see the patient today.  I will keep her n.p.o. except for medications.  2] type 2 diabetes patient takes Levemir insulin at home which will be on hold and have her on sliding scale insulin ~work-up is finished.  She is on Levemir 10 units at bedtime at home.  3]htn-she is on Lopressor 50 mg 2 times a day at home.  She is also on Lasix 20 mg daily.  These medicines will be on hold until work-up is complete I will place her on PRN hydralazine for now.  4] history of CKD stage III patient has a history of being on dialysis in the past.  Her urine appears tea colored and has RBCs in the urine .  Will send a urine culture.  Patient has history of frequent UTIs and with a history of dementia she is unable to communicate her complaints.  Hold any nephrotoxic drugs.  5] history of hyperthyroidism continue methimazole.  6] history of congestive heart failure patient takes Lasix at home which will be on hold restart as needed.  Her last echocardiogram was September 2017 with normal ejection fraction.  7] history of dementia patient on  Namenda at home please restart when she is able to start taking p.o.   DVT prophylaxis:scd Code Status:full  Family Communication: dw  daughter Disposition Plan: tbd Consults called: [facility] Admission status: in patient   Georgette Shell MD [facility]  If 7PM-7AM, please contact night-coverage [email or web address] Password [email or web address]  05/02/2018, 8:10 AM

## 2018-05-02 NOTE — ED Provider Notes (Signed)
[facility] EMERGENC[facility] DEPARTMENT Provider Note   CSN: [id number] Arrival date & time: 05/02/18  0245     History   Chief Complaint Chief Complaint  Patient presents with  . Rectal Bleeding    HPI Angelica Beck is a 67 y.o. female.  The history is provided by a relative. The history is limited by the condition of the patient (Dementia).  She has history of hypertension, diabetes, hyperlipidemia, stroke with right hemiparesis, diastolic heart failure and comes in with a rectal bleeding.  Caregiver says that she went to change the diaper and noted bright red blood.  When she wiped the patient, dark clots came from the rectum.  Patient is not complaining of any pain and has not been vomiting.  Caregiver thought she was taking clopidogrel, but it is not on her medication list.  She is on no systemic anticoagulants.  Past Medical History:  Diagnosis Date  . Arthritis   . Asthma    09/07/16  . Diabetes mellitus, type 2 ([place])   . Diffuse goiter   . Hyperlipidemia   . Hypertension   . Hyperthyroidism   . Obesity hypoventilation syndrome ([place]) 06/20/2013  . Stroke ([place])   . Vascular dementia     Patient Active Problem List   Diagnosis Date Noted  . Acute on chronic diastolic CHF (congestive heart failure) ([place]) 03/23/2017  . Laryngeal stridor 03/23/2017  . Goiter diffuse 03/23/2017  . Dementia 03/20/2017  . SOB (shortness of breath)   . Hemoptysis 02/22/2017  . Stroke ([place])   . History of hemoptysis 02/16/2017  . Chronic bronchitis ([place]) 01/25/2017  . Acute bronchitis 12/27/2016  . CKD (chronic kidney disease) stage 3, GFR 30-59 ml/min (HCC) 12/27/2016  . PNA (pneumonia) 12/21/2016  . Edema 11/02/2016  . Diastolic dysfunction 67/34/1937  . Osteoarthritis of both knees 05/08/2017  . At risk for aspiration 10/03/2016  . Normocytic normochromic anemia 09/07/2016  . Acute on chronic respiratory failure ([place])   . (HFpEF) heart failure with preserved  ejection fraction ([place])   . Enteritis due to Clostridium difficile   . SBO (small bowel obstruction) ([place]) 08/10/2016  . Collapse of left lung   . S/P percutaneous endoscopic gastrostomy (PEG) tube placement ([place])   . Tracheostomy status ([place])   . HCAP (healthcare-associated pneumonia) 07/16/2016  . Encephalopathy   . Palliative care encounter   . Pressure injury of skin 06/24/2016  . Acute respiratory failure ([place]) 06/21/2016  . Septic shock ([place]) 06/20/2016  . Chronic respiratory failure with hypoxia ([place]) 11/29/2015  . Essential hypertension   . Lactic acidosis 08/02/2015  . History of CVA (cerebrovascular accident) 08/02/2015  . Chronic systolic HF (heart failure) ([place]) 08/02/2015  . Cognitive impairment 08/02/2015  . AKI (acute kidney injury) ([place]) 02/08/2015  . Abnormality of gait 02/08/2015  . Hyperthyroidism 02/08/2015  . DM2 (diabetes mellitus, type 2) ([place]) 02/08/2015  . Hyperglycemia 06/28/2009  . HYPERLIPIDEMIA-MIXED 06/28/2009  . CAD, NATIVE VESSEL 06/28/2009  . DYSPNEA 06/28/2009    Past Surgical History:  Procedure Laterality Date  . IR GENERIC HISTORICAL  07/12/2016   IR US GUIDE VASC ACCESS RIGHT 07/12/2016 [name], MD [facility]  . IR GENERIC HISTORICAL  07/12/2016   IR FLUORO GUIDE CV LINE RIGHT 07/12/2016 [name], MD [facility]  . IR GENERIC HISTORICAL  07/17/2016   IR GASTROSTOMY TUBE MOD SED 07/17/2016 [name], MD [facility]  . IR GENERIC HISTORICAL  08/23/2016   IR REMOVAL TUN CV CATH W/O  [facility] 08/23/2016 [name], MD [facility]  . IR GENERIC HISTORICAL  11/09/2016   IR GASTROSTOMY TUBE REMOVAL 11/09/2016 [name], PA-C [facility]  . TRACHEOSTOMY TUBE PLACEMENT N/A 07/14/2016   Procedure: TRACHEOSTOMY, THYROID ISTHMUSECTOMY;  Surgeon: [name], MD;  Location: [facility];  Service: ENT;  Laterality: N/A;  . VENTRAL HERNIA REPAIR       OB History   None      Home Medications    Prior to Admission  medications   Medication Sig Start Date End Date Taking? Authorizing Provider  acetaminophen (TYLENOL) 500 MG tablet Take 500 mg by mouth every 6 (six) hours as needed for mild pain.     [provider]  aspirin 81 MG chewable tablet Chew 81 mg by mouth daily.    [provider]  atorvastatin (LIPITOR) 40 MG tablet Take 40 mg by mouth every evening.     [provider]  b complex-vitamin c-folic acid (NEPHRO-VITE) 0.8 MG TABS tablet Take 1 tablet by mouth every morning.    [provider]  bisacodyl (DULCOLAX) 10 MG suppository Place 10 mg rectally daily as needed for moderate constipation.    [provider]  budesonide (PULMICORT) 0.5 MG/2ML nebulizer solution Take 0.5 mg by nebulization 2 (two) times daily.    [provider]  cholecalciferol (VITAMIN D) 1000 units tablet Take 1,000 Units by mouth daily.    [provider]  docusate sodium (COLACE) 100 MG capsule Take 100 mg by mouth daily.     [provider]  famotidine (PEPCID) 20 MG tablet Take 20 mg by mouth daily.     [provider]  ferrous sulfate 325 (65 FE) MG tablet Take 325 mg by mouth 2 (two) times daily with a meal.    [provider]  furosemide (LASIX) 20 MG tablet Take 20 mg by mouth daily.    [provider]  insulin detemir (LEVEMIR) 100 UNIT/ML injection Inject 10 Units into the skin at bedtime.     [provider]  ipratropium-albuterol (DUONEB) 0.5-2.5 (3) MG/3ML SOLN Take 3 mLs by nebulization every 4 (four) hours as needed (for dyspnea).    [provider]  magnesium hydroxide (MILK OF MAGNESIA) 400 MG/5ML suspension Take 30 mLs by mouth daily as needed for mild constipation.    [provider]  Melatonin 3 MG TABS Take 3 mg by mouth at bedtime.     [provider]  memantine (NAMENDA) 10 MG tablet Take 10 mg by mouth 2 (two) times daily.     [provider]  methimazole  (TAPAZOLE) 10 MG tablet Take 10 mg by mouth daily.     [provider]  metoprolol (LOPRESSOR) 50 MG tablet Take 50 mg by mouth 2 (two) times daily.    [provider]  ondansetron (ZOFRAN) 4 MG tablet Take 4 mg by mouth every 8 (eight) hours as needed for nausea or vomiting.    [provider]  potassium chloride SA (K-DUR,KLOR-CON) 20 MEQ tablet Take 20 mEq by mouth every Monday, Wednesday, and Friday.    [provider]  Sodium Phosphates (RA SALINE ENEMA RE) Place 1 Applicatorful rectally daily as needed (constipation).    [provider]  trolamine salicylate (ASPERCREME) 10 % cream Apply 1 application topically 2 (two) times daily.     [provider]    Family History Family History  Problem Relation Age of Onset  . Diabetes Mother   .  Cancer Neg Hx   . Heart disease Neg Hx   . Stroke Neg Hx     Social History Social History   Tobacco Use  . Smoking status: Former Smoker    Packs/day: 0.25    Years: 30.00    Pack years: 7.50    Types: Cigarettes    Last attempt to quit: 09/25/2002    Years since quitting: 15.6  . Smokeless tobacco: Never Used  Substance Use Topics  . Alcohol use: No  . Drug use: No     Allergies   Penicillins and Lactose intolerance (gi)   Review of Systems Review of Systems  Unable to perform ROS: Dementia     Physical Exam Updated Vital Signs BP (!) 93/47   Pulse 66   Temp 97.9 F (36.6 C) (Rectal)   Resp 11   Ht 5\' 8"  (1.727 m)   Wt 110 kg   SpO2 99%   BMI 36.87 kg/m   Physical Exam  Nursing note and vitals reviewed.  Obese 67 year old female, resting comfortably and in no acute distress. Vital signs are normal. Oxygen saturation is 99%, which is normal. Head is normocephalic and atraumatic. PERRLA, EOMI. Oropharynx is clear. Neck is nontender and supple without adenopathy or JVD. Back is nontender and there is no CVA tenderness. Lungs are clear without rales, wheezes, or  rhonchi. Chest is nontender. Heart has regular rate and rhythm without murmur. Abdomen is soft, flat, nontender without masses or hepatosplenomegaly and peristalsis is normoactive. Extremities have no cyanosis or edema, full range of motion is present. Skin is warm and dry without rash. Neurologic: Awake and oriented to person, cranial nerves are intact.  Moderate right hemiparesis is present affecting leg more than arm.  ED Treatments / Results  Labs (all labs ordered are listed, but only abnormal results are displayed) Labs Reviewed  COMPREHENSIVE METABOLIC PANEL - Abnormal; Notable for the following components:      Result Value   Glucose, Bld 129 (*)    Creatinine, Ser 1.64 (*)    Albumin 3.1 (*)    GFR calc non Af Amer 32 (*)    GFR calc Af Amer 37 (*)    All other components within normal limits  CBC - Abnormal; Notable for the following components:   Hemoglobin 11.4 (*)    MCH 25.9 (*)    MCHC 29.6 (*)    All other components within normal limits  URINALYSIS, ROUTINE W REFLEX MICROSCOPIC - Abnormal; Notable for the following components:   Color, Urine AMBER (*)    APPearance HAZY (*)    Hgb urine dipstick LARGE (*)    Protein, ur 100 (*)    RBC / HPF >50 (*)    Bacteria, UA MANY (*)    All other components within normal limits  POC OCCULT BLOOD, ED - Abnormal; Notable for the following components:   Fecal Occult Bld POSITIVE (*)    All other components within normal limits  URINE CULTURE  DIFFERENTIAL  TYPE AND SCREEN   Medications Ordered in ED Medications - No data to display   Initial Impression / Assessment and Plan / ED Course  I have reviewed the triage vital signs and the nursing notes.  Pertinent labs & imaging results that were available during my care of the patient were reviewed by me and considered in my medical decision making (see chart for details).  Rectal bleeding.  Hemoglobin is actually higher than previously recorded hemoglobins in 2018  in  2017.  However, I feel she will need to be admitted for serial hemoglobins.  Old records are reviewed, and she has no relevant past visits.  Hemodynamics have been stable.  Urinalysis does show hematuria, possible urinary infection.  Case is discussed with Dr. Hal Hope of [facility], who agrees to admit the patient.  Final Clinical Impressions(s) / ED Diagnoses   Final diagnoses:  Rectal bleeding  Renal insufficiency    ED Discharge Orders    None       Delora Fuel, MD 17/61/60 [phone]

## 2018-05-02 NOTE — ED Notes (Signed)
ED Provider at bedside. 

## 2018-05-03 ENCOUNTER — Inpatient Hospital Stay (HOSPITAL_COMMUNITY): Payer: Medicare Other

## 2018-05-03 DIAGNOSIS — N939 Abnormal uterine and vaginal bleeding, unspecified: Secondary | ICD-10-CM | POA: Clinically undetermined

## 2018-05-03 DIAGNOSIS — I129 Hypertensive chronic kidney disease with stage 1 through stage 4 chronic kidney disease, or unspecified chronic kidney disease: Secondary | ICD-10-CM

## 2018-05-03 DIAGNOSIS — I5022 Chronic systolic (congestive) heart failure: Secondary | ICD-10-CM

## 2018-05-03 DIAGNOSIS — E1122 Type 2 diabetes mellitus with diabetic chronic kidney disease: Secondary | ICD-10-CM

## 2018-05-03 DIAGNOSIS — I10 Essential (primary) hypertension: Secondary | ICD-10-CM

## 2018-05-03 DIAGNOSIS — E0590 Thyrotoxicosis, unspecified without thyrotoxic crisis or storm: Secondary | ICD-10-CM

## 2018-05-03 LAB — URINE CULTURE

## 2018-05-03 LAB — COMPREHENSIVE METABOLIC PANEL
ALBUMIN: 3.1 g/dL — AB (ref 3.5–5.0)
ALK PHOS: 114 U/L (ref 38–126)
ALT: <5 U/L (ref 0–44)
ANION GAP: 17 — AB (ref 5–15)
AST: 22 U/L (ref 15–41)
BILIRUBIN TOTAL: 0.7 mg/dL (ref 0.3–1.2)
BUN: 19 mg/dL (ref 8–23)
CALCIUM: 9.3 mg/dL (ref 8.9–10.3)
CO2: 20 mmol/L — ABNORMAL LOW (ref 22–32)
Chloride: 104 mmol/L (ref 98–111)
Creatinine, Ser: 1.4 mg/dL — ABNORMAL HIGH (ref 0.44–1.00)
GFR, EST AFRICAN AMERICAN: 45 mL/min — AB (ref >60)
GFR, EST NON AFRICAN AMERICAN: 38 mL/min — AB (ref >60)
GLUCOSE: 125 mg/dL — AB (ref 70–99)
POTASSIUM: 4.8 mmol/L (ref 3.5–5.1)
Sodium: 141 mmol/L (ref 135–145)
TOTAL PROTEIN: 6.7 g/dL (ref 6.5–8.1)

## 2018-05-03 LAB — GLUCOSE, CAPILLARY
GLUCOSE-CAPILLARY: 149 mg/dL — AB (ref 70–99)
GLUCOSE-CAPILLARY: 100 mg/dL — AB (ref 70–99)
Glucose-Capillary: 116 mg/dL — ABNORMAL HIGH (ref 70–99)
Glucose-Capillary: 124 mg/dL — ABNORMAL HIGH (ref 70–99)
Glucose-Capillary: 172 mg/dL — ABNORMAL HIGH (ref 70–99)

## 2018-05-03 LAB — PROTIME-INR
INR: 1.09
Prothrombin Time: 14.1 seconds (ref 11.4–15.2)

## 2018-05-03 LAB — HIV ANTIBODY (ROUTINE TESTING W REFLEX): HIV Screen 4th Generation wRfx: NONREACTIVE

## 2018-05-03 LAB — CBC
HEMATOCRIT: 38.1 % (ref 36.0–46.0)
HEMOGLOBIN: 11.7 g/dL — AB (ref 12.0–15.0)
MCH: 26 pg (ref 26.0–34.0)
MCHC: 30.7 g/dL (ref 30.0–36.0)
MCV: 84.7 fL (ref 78.0–100.0)
Platelets: 190 10*3/uL (ref 150–400)
RBC: 4.5 MIL/uL (ref 3.87–5.11)
RDW: 14.6 % (ref 11.5–15.5)
WBC: 8.5 10*3/uL (ref 4.0–10.5)

## 2018-05-03 LAB — APTT: APTT: 27 s (ref 24–36)

## 2018-05-03 MED ORDER — METOPROLOL TARTRATE 50 MG PO TABS
50.0000 mg | ORAL_TABLET | Freq: Two times a day (BID) | ORAL | Status: DC
  Administered 2018-05-03 – 2018-05-04 (×2): 50 mg via ORAL
  Filled 2018-05-03 (×2): qty 1

## 2018-05-03 MED ORDER — PANTOPRAZOLE SODIUM 40 MG PO TBEC
40.0000 mg | DELAYED_RELEASE_TABLET | Freq: Every day | ORAL | Status: DC
  Administered 2018-05-04 – 2018-05-09 (×6): 40 mg via ORAL
  Filled 2018-05-03 (×6): qty 1

## 2018-05-03 MED ORDER — SENNOSIDES-DOCUSATE SODIUM 8.6-50 MG PO TABS
1.0000 | ORAL_TABLET | Freq: Two times a day (BID) | ORAL | Status: DC
  Administered 2018-05-03 – 2018-05-09 (×11): 1 via ORAL
  Filled 2018-05-03 (×11): qty 1

## 2018-05-03 MED ORDER — POLYETHYLENE GLYCOL 3350 17 G PO PACK
17.0000 g | PACK | Freq: Every day | ORAL | Status: DC
  Administered 2018-05-03 – 2018-05-09 (×5): 17 g via ORAL
  Filled 2018-05-03 (×6): qty 1

## 2018-05-03 MED ORDER — BISACODYL 10 MG RE SUPP
10.0000 mg | RECTAL | Status: DC
  Administered 2018-05-04: 10 mg via RECTAL
  Filled 2018-05-03 (×2): qty 1

## 2018-05-03 MED ORDER — MEMANTINE HCL 10 MG PO TABS
10.0000 mg | ORAL_TABLET | Freq: Two times a day (BID) | ORAL | Status: DC
  Administered 2018-05-03 – 2018-05-09 (×12): 10 mg via ORAL
  Filled 2018-05-03 (×13): qty 1

## 2018-05-03 MED ORDER — VITAMIN D 1000 UNITS PO TABS
1000.0000 [IU] | ORAL_TABLET | Freq: Every day | ORAL | Status: DC
  Administered 2018-05-04 – 2018-05-09 (×6): 1000 [IU] via ORAL
  Filled 2018-05-03 (×6): qty 1

## 2018-05-03 NOTE — Progress Notes (Addendum)
SMOG enema administered during which patient urinated through external catheter what appeared to be bloody urine. NT bathed patient after enema administered and stated pulled several large clots from vaginal area.  No breakdown or bleeding noted on rectum, some redness/breakdown noted around meatus. Pt complained of pain during peri care.  Dr Baltazar Najjar notified that bleeding did not appear to be from rectum.

## 2018-05-03 NOTE — Plan of Care (Signed)
  Problem: Nutrition: Goal: Adequate nutrition will be maintained Outcome: Progressing   Problem: Elimination: Goal: Will not experience complications related to bowel motility Outcome: Progressing Goal: Will not experience complications related to urinary retention Outcome: Progressing   Problem: Safety: Goal: Ability to remain free from injury will improve Outcome: Progressing

## 2018-05-03 NOTE — Progress Notes (Addendum)
     [facility] Progress Note   Chief Complaint:   Rectal bleeding    SUBJECTIVE:    offers no complaints. Nods yes that she got enema yesterday and had BM   ASSESSMENT AND PLAN:   1. 67 yo bedbound female with multiple medical problems admitted with painless rectal bleeding. She was impacted with stool on exam precluding DRE. Received SMOG enema yesterday with results per RN.  -Repeat DRE -Impaction resolved. Light brown thin liquid residual in vault. No overt blood -Events from last night noted. Apparently patient having VAGINAL bleeding with clots so not even sure that she ever was having rectal bleeding.  -If patient does have overt GI bleeding then will continue with workup   2. Mild chronic normocytic anemia. Hgb completely stable at 11.7, actually better than her baseline  3. Constipation, impaction resolved.  -needs bowel regimen at discharge. Consider daily Miralax but would also add Dulcolax supp QOD.    Attending physician's note   I have taken an interval history, reviewed the chart and examined the patient. I agree with the Advanced Practitioner's note, impression and recommendations.   67 year old with painless hematochezia with rectal impaction status post disimpaction with enemas.  No further rectal bleeding.  She is having vaginal bleeding with clots which likely was misdiagnosed as having rectal bleeding.  Hemoglobin stable.  No need for GI work-up.  Need MiraLAX every day to avoid constipation.  Will sign off.  Patient with multiple medical problems who is bedbound-not a candidate for elective colonoscopy.  Angelica Austria, MD  OBJECTIVE:     Vital signs in last 24 hours: Temp:  [97.7 F (36.5 C)-98.3 F (36.8 C)] 97.7 F (36.5 C) (08/09 0418) Pulse Rate:  [66-93] 93 (08/09 0418) Resp:  [17-20] 20 (08/09 0418) BP: (140-171)/(70-90) 145/90 (08/09 0418) SpO2:  [94 %-100 %] 94 % (08/09 0841) Last BM Date: 05/02/18 General:   Alert, obese female  in NAD EENT:  Normal hearing, non icteric sclera, conjunctive pink.  Pulm: Normal respiratory effort. Abdomen:  Soft, nondistended, nontender.  Normal bowel sounds Neurologic:  Alert and nods appropriately to questions though takes a while for response  Psych:  Pleasant, cooperative.  Normal mood and affect.   Intake/Output from previous day: 08/08 0701 - 08/09 0700 In: 120 [P.O.:120] Out: 450 [Urine:450] Intake/Output this shift: No intake/output data recorded.  Lab Results: Recent Labs    05/02/18 0300 05/02/18 1004 05/03/18 0420  WBC 9.8 12.0* 8.5  HGB 11.4* 11.6* 11.7*  HCT 38.5 37.8 38.1  PLT 255 246 190   BMET Recent Labs    05/02/18 0300 05/03/18 0420  NA 140 141  K 4.1 4.8  CL 100 104  CO2 28 20*  GLUCOSE 129* 125*  BUN 22 19  CREATININE 1.64* 1.40*  CALCIUM 9.9 9.3   LFT Recent Labs    05/03/18 0420  PROT 6.7  ALBUMIN 3.1*  AST 22  ALT <5  ALKPHOS 114  BILITOT 0.7   PT/INR Recent Labs    05/03/18 0420  LABPROT 14.1  INR 1.09      LOS: 1 day   Tye Savoy ,NP 05/03/2018, 9:35 AM

## 2018-05-03 NOTE — Progress Notes (Signed)
PROGRESS NOTE    Angelica Beck  QMG:[id number] DOB: 1950/11/11 DOA: 05/02/2018 PCP: Clovia Cuff, MD    Brief Narrative: Angelica Beck is a 67 y.o. female with medical history significant of diabetes, hypertension, hyperthyroidism, history of stroke,chf,ckd stage III vascular dementia admitted with complaints of passing clots via rectum 3-4 times since yesterday.  Patient lives at home with her daughter who takes care of her 24 hours.  No history of nausea vomiting hematemesis or hematuria.  Daughter reported that she is never had anything like this in the past no known history of hemorrhoids diverticulitis .  Patient has had a history of bowel obstruction in the past. she denies any fever, chills, cough, abdominal pain.denies any chest pain shortness of breath. Patient has had a history of long hospital stay complicated by small bowel obstruction trach and PEG in the past.  ED Course: Her blood pressure was 148 111 pulse was 57 respirations 17 sats 95% on room air.  Were significant for hemoglobin of 11.4 which is better than before though she appears somewhat dry normal platelets white count 9.8 sodium 140 potassium 4.1 BUN 22 creatinine 1.64 which is her baseline.   Assessment & Plan:   Principal Problem:   Rectal bleeding Active Problems:   Hyperthyroidism   Type 2 DM with CKD and hypertension (HCC)   History of CVA (cerebrovascular accident)   Chronic systolic HF (heart failure) (HCC)   Cognitive impairment   Essential hypertension   CKD (chronic kidney disease), stage III (HCC)   GI bleed   Renal insufficiency   Dementia due to Parkinson's disease without behavioral disturbance (HCC)   Vaginal bleeding  #1 rectal bleeding/fecal impaction Patient had presented with rectal bleeding on presentation and concern for fecal impaction.  Patient was seen by gastroenterology and smog enema ordered.  Patient with bowel movement.  Patient seen by GI PA this morning who noted  resolution of fecal impaction as rectal vault with thin light brown liquid residuals with no overt bleeding.  Advance diet to a soft diet.  GI following and appreciate input and recommendations.  2.??  Vaginal bleeding Events overnight noted that patient had clots coming from the vagina.  Check a pelvic ultrasound.  Follow H&H.  If pelvic ultrasound is abnormal and patient has ongoing vaginal bleeding will need to consult with OB/GYN.  Otherwise may need outpatient follow-up with GYN.  3.  Constipation/impaction Resolved.  Patient received a smog enema.  Placed on MiraLAX daily as well as Senokot as twice daily and Dulcolax suppository every other day per GI recommendations.  4.  Hyperthyroidism Resume home dose Lopressor.  Continue methimazole.  Outpatient follow-up.  5.  Dementia due to Parkinson's disease Stable.  Resume home regimen Namenda.  6.  Diabetes mellitus  Last hemoglobin A1c was 7.4 on 03/10/2017.  CBG this morning was 116.  Continue sliding scale insulin.  Continue to hold Levemir.  7.  Hypertension Stable.  Resume home dose Lopressor.  8.  History of diastolic CHF Diuretics on hold.  Will monitor for now.  Resume hopefully in the next 1 to 2 days.   DVT prophylaxis: SCDs Code Status: Full Family Communication: No family at bedside. Disposition Plan: To be determined.   Consultants:   Gastroenterology: Dr. Lyndel Safe 05/02/2018  Procedures:   None  Antimicrobials:   None   Subjective: Patient denies any further rectal bleeding.  Events overnight noted with concern for vaginal bleeding.  Patient noted to have bowel movement after smog enema.  Patient tolerating clears.  Objective: Vitals:   05/03/18 0006 05/03/18 0418 05/03/18 0841 05/03/18 1340  BP: (!) 153/81 (!) 145/90  (!) 148/111  Pulse: 66 93  (!) 120  Resp:  20  (!) 22  Temp:  97.7 F (36.5 C)  97.7 F (36.5 C)  TempSrc:  Oral  Oral  SpO2: 100%  94% 95%  Weight:      Height:         Intake/Output Summary (Last 24 hours) at 05/03/2018 1750 Last data filed at 05/03/2018 0937 Gross per 24 hour  Intake 0 ml  Output 450 ml  Net -450 ml   Filed Weights   05/02/18 0252  Weight: 110 kg    Examination:  General exam: Appears calm and comfortable  Respiratory system: Clear to auscultation. Respiratory effort normal. Cardiovascular system: S1 & S2 heard, RRR. No JVD, murmurs, rubs, gallops or clicks. No pedal edema. Gastrointestinal system: Abdomen is nondistended, soft and nontender. No organomegaly or masses felt. Normal bowel sounds heard. Central nervous system: Alert. No focal neurological deficits. Extremities: Symmetric 5 x 5 power. Skin: No rashes, lesions or ulcers Psychiatry: Judgement and insight appear normal. Mood & affect appropriate.     Data Reviewed: I have personally reviewed following labs and imaging studies  CBC: Recent Labs  Lab 05/02/18 0300 05/02/18 1004 05/03/18 0420  WBC 9.8 12.0* 8.5  NEUTROABS 6.7  --   --   HGB 11.4* 11.6* 11.7*  HCT 38.5 37.8 38.1  MCV 87.5 84.9 84.7  PLT 255 246 638   Basic Metabolic Panel: Recent Labs  Lab 05/02/18 0300 05/03/18 0420  NA 140 141  K 4.1 4.8  CL 100 104  CO2 28 20*  GLUCOSE 129* 125*  BUN 22 19  CREATININE 1.64* 1.40*  CALCIUM 9.9 9.3   GFR: Estimated Creatinine Clearance: 52 mL/min (A) (by C-G formula based on SCr of 1.4 mg/dL (H)). Liver Function Tests: Recent Labs  Lab 05/02/18 0300 05/03/18 0420  AST 20 22  ALT 11 <5  ALKPHOS 123 114  BILITOT 0.7 0.7  PROT 7.3 6.7  ALBUMIN 3.1* 3.1*   No results for input(s): LIPASE, AMYLASE in the last 168 hours. No results for input(s): AMMONIA in the last 168 hours. Coagulation Profile: Recent Labs  Lab 05/03/18 0420  INR 1.09   Cardiac Enzymes: No results for input(s): CKTOTAL, CKMB, CKMBINDEX, TROPONINI in the last 168 hours. BNP (last 3 results) No results for input(s): PROBNP in the last 8760 hours. HbA1C: No results  for input(s): HGBA1C in the last 72 hours. CBG: Recent Labs  Lab 05/02/18 2127 05/03/18 0619 05/03/18 0749 05/03/18 1339 05/03/18 1659  GLUCAP 164* 116* 149* 100* 124*   Lipid Profile: No results for input(s): CHOL, HDL, LDLCALC, TRIG, CHOLHDL, LDLDIRECT in the last 72 hours. Thyroid Function Tests: No results for input(s): TSH, T4TOTAL, FREET4, T3FREE, THYROIDAB in the last 72 hours. Anemia Panel: No results for input(s): VITAMINB12, FOLATE, FERRITIN, TIBC, IRON, RETICCTPCT in the last 72 hours. Sepsis Labs: No results for input(s): PROCALCITON, LATICACIDVEN in the last 168 hours.  Recent Results (from the past 240 hour(s))  Urine culture     Status: Abnormal   Collection Time: 05/02/18  6:24 AM  Result Value Ref Range Status   Specimen Description URINE, CATHETERIZED  Final   Special Requests   Final    NONE Performed at [facility], [address] [place], [place] [address]    Culture MULTIPLE SPECIES PRESENT,  SUGGEST RECOLLECTION (A)  Final   Report Status 05/03/2018 FINAL  Final         Radiology Studies: US Pelvic Complete With Transvaginal  Result Date: 05/03/2018 CLINICAL DATA:  Study is limited due to patient body habitus and the patient's inability to tolerate the transvaginal probe. EXAM: TRANSABDOMINAL AND TRANSVAGINAL ULTRASOUND OF PELVIS TECHNIQUE: Both transabdominal and transvaginal ultrasound examinations of the pelvis were performed. Transabdominal technique was performed for global imaging of the pelvis including uterus, ovaries, adnexal regions, and pelvic cul-de-sac. It was necessary to proceed with endovaginal exam following the transabdominal exam to visualize the endometrium and ovaries. COMPARISON:  None FINDINGS: Uterus Measurements: 12.3 x 8.0 x 8.4 cm. There appear to be 2 masses in the uterus measuring 3.5 x 2.3 x 3.2 cm and 4.3 x 3.9 x 4.3 cm. Endometrium Thickness: 21.1 mm.  There is fluid in the endometrial canal. Right ovary Not  visualized. Left ovary Not visualized. Other findings No abnormal free fluid. IMPRESSION: 1. The study is significantly limited due to the patient's body habitus and the patient's inability to tolerate the vaginal probe. 2. The endometrium appears to be significantly thickened measuring up to 21.1 mm. There is fluid in the canal. In the setting of post-menopausal bleeding, endometrial sampling is indicated to exclude carcinoma. If results are benign, sonohysterogram should be considered for focal lesion work-up. (Ref: Radiological Reasoning: Algorithmic Workup of Abnormal Vaginal Bleeding with Endovaginal Sonography and Sonohysterography. AJR 2008; 329:J18-84) 3. Two apparent fibroids. Electronically Signed   By: Dorise Bullion III M.D   On: 05/03/2018 12:42        Scheduled Meds: . budesonide  0.5 mg Nebulization BID  . [START ON 05/04/2018] cholecalciferol  1,000 Units Oral Daily  . insulin aspart  0-9 Units Subcutaneous TID WC  . memantine  10 mg Oral BID  . methimazole  10 mg Oral Daily  . metoprolol tartrate  50 mg Oral BID  . sorbitol, milk of mag, mineral oil, glycerin (SMOG) enema  960 mL Rectal Once   Continuous Infusions:   LOS: 1 day    Time spent: 35 minutes    Irine Seal, MD [facility] Pager [phone]  If 7PM-7AM, please contact night-coverage [email or web address] Password [email or web address] 05/03/2018, 5:50 PM

## 2018-05-04 DIAGNOSIS — R9389 Abnormal findings on diagnostic imaging of other specified body structures: Secondary | ICD-10-CM | POA: Diagnosis present

## 2018-05-04 DIAGNOSIS — R001 Bradycardia, unspecified: Secondary | ICD-10-CM | POA: Clinically undetermined

## 2018-05-04 DIAGNOSIS — R4189 Other symptoms and signs involving cognitive functions and awareness: Secondary | ICD-10-CM

## 2018-05-04 LAB — CBC
HEMATOCRIT: 33.6 % — AB (ref 36.0–46.0)
HEMOGLOBIN: 10 g/dL — AB (ref 12.0–15.0)
MCH: 25.8 pg — AB (ref 26.0–34.0)
MCHC: 29.8 g/dL — AB (ref 30.0–36.0)
MCV: 86.6 fL (ref 78.0–100.0)
Platelets: 205 10*3/uL (ref 150–400)
RBC: 3.88 MIL/uL (ref 3.87–5.11)
RDW: 14.6 % (ref 11.5–15.5)
WBC: 8.1 10*3/uL (ref 4.0–10.5)

## 2018-05-04 LAB — TSH: TSH: 3.481 u[IU]/mL (ref 0.350–4.500)

## 2018-05-04 LAB — GLUCOSE, CAPILLARY
GLUCOSE-CAPILLARY: 168 mg/dL — AB (ref 70–99)
GLUCOSE-CAPILLARY: 124 mg/dL — AB (ref 70–99)
Glucose-Capillary: 118 mg/dL — ABNORMAL HIGH (ref 70–99)
Glucose-Capillary: 208 mg/dL — ABNORMAL HIGH (ref 70–99)

## 2018-05-04 LAB — BASIC METABOLIC PANEL
Anion gap: 11 (ref 5–15)
BUN: 15 mg/dL (ref 8–23)
CHLORIDE: 107 mmol/L (ref 98–111)
CO2: 24 mmol/L (ref 22–32)
CREATININE: 1.3 mg/dL — AB (ref 0.44–1.00)
Calcium: 9.1 mg/dL (ref 8.9–10.3)
GFR calc Af Amer: 49 mL/min — ABNORMAL LOW (ref >60)
GFR calc non Af Amer: 42 mL/min — ABNORMAL LOW (ref >60)
GLUCOSE: 122 mg/dL — AB (ref 70–99)
Potassium: 3.6 mmol/L (ref 3.5–5.1)
Sodium: 142 mmol/L (ref 135–145)

## 2018-05-04 LAB — TROPONIN I: Troponin I: 0.14 ng/mL (ref <0.03)

## 2018-05-04 MED ORDER — HYDRALAZINE HCL 25 MG PO TABS
25.0000 mg | ORAL_TABLET | Freq: Three times a day (TID) | ORAL | Status: DC
  Administered 2018-05-04 – 2018-05-09 (×14): 25 mg via ORAL
  Filled 2018-05-04 (×14): qty 1

## 2018-05-04 MED ORDER — INSULIN DETEMIR 100 UNIT/ML ~~LOC~~ SOLN
10.0000 [IU] | Freq: Every day | SUBCUTANEOUS | Status: DC
  Administered 2018-05-04: 10 [IU] via SUBCUTANEOUS
  Filled 2018-05-04 (×2): qty 0.1

## 2018-05-04 MED ORDER — METOPROLOL TARTRATE 12.5 MG HALF TABLET
12.5000 mg | ORAL_TABLET | Freq: Two times a day (BID) | ORAL | Status: DC
  Administered 2018-05-05 – 2018-05-09 (×8): 12.5 mg via ORAL
  Filled 2018-05-04 (×8): qty 1

## 2018-05-04 MED ORDER — MEGESTROL ACETATE 40 MG PO TABS
80.0000 mg | ORAL_TABLET | Freq: Two times a day (BID) | ORAL | Status: DC
  Administered 2018-05-04 (×2): 80 mg via ORAL
  Filled 2018-05-04 (×3): qty 2

## 2018-05-04 MED ORDER — FUROSEMIDE 20 MG PO TABS
20.0000 mg | ORAL_TABLET | Freq: Every day | ORAL | Status: DC
  Administered 2018-05-05 – 2018-05-06 (×2): 20 mg via ORAL
  Filled 2018-05-04 (×3): qty 1

## 2018-05-04 NOTE — Progress Notes (Addendum)
PROGRESS NOTE    Ersilia Brawley  CWC:[id number] DOB: 08/24/1951 DOA: 05/02/2018 PCP: Clovia Cuff, MD    Brief Narrative: Laelah Siravo is a 67 y.o. female with medical history significant of diabetes, hypertension, hyperthyroidism, history of stroke,chf,ckd stage III vascular dementia admitted with complaints of passing clots via rectum 3-4 times since yesterday.  Patient lives at home with her daughter who takes care of her 24 hours.  No history of nausea vomiting hematemesis or hematuria.  Daughter reported that she is never had anything like this in the past no known history of hemorrhoids diverticulitis .  Patient has had a history of bowel obstruction in the past. she denies any fever, chills, cough, abdominal pain.denies any chest pain shortness of breath. Patient has had a history of long hospital stay complicated by small bowel obstruction trach and PEG in the past.  ED Course: Her blood pressure was 148 111 pulse was 57 respirations 17 sats 95% on room air.  Were significant for hemoglobin of 11.4 which is better than before though she appears somewhat dry normal platelets white count 9.8 sodium 140 potassium 4.1 BUN 22 creatinine 1.64 which is her baseline.   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  Patient was seen by gastroenterology and smog enema ordered.  Patient with bowel  movement.  Patient seen by GI PA the morning of 05/03/2018, who noted resolution of fecal impaction as rectal vault with thin light brown liquid residuals with no overt bleeding.  Advanced diet to a soft diet.  GI following and appreciate input and recommendations.  2.??  Vaginal bleeding Events overnight noted that patient had clots coming from the vagina.  Check a pelvic ultrasound.  Follow H&H.  Pelvic ultrasound done was concerning for endometrial thickening as well as masses noted on the uterus.  Place on Megace 80 mg twice daily.  Consult with GYN for further evaluation and management.    3.  Constipation/impaction Resolved.  Patient received a smog enema.  Placed on MiraLAX daily as well as Senokot as twice daily and Dulcolax suppository every other day per GI recommendations.  4.  Hyperthyroidism Decrease Lopressor to 12.5 mg twice daily due to bradycardia and sinus pause of 3.5 noted this morning.  Continue methimazole.  Outpatient follow-up.    5.  Dementia due to Parkinson's disease Stable.  Resumed home regimen Namenda.  6.  Diabetes mellitus  Last hemoglobin A1c was 7.4 on 03/10/2017.  CBG this morning was 118.  Continue sliding scale insulin.  Resume home dose Levemir.    7.  Hypertension Stable.  Patient placed on Lopressor however patient noted to be severely bradycardic this morning with 3.5-second pause and as such Lopressor discontinued.  Will place on low-dose hydralazine for better blood pressure control.    8.  History of diastolic CHF Diuretics on hold.  Patient currently asymptomatic.  Resume diuretics tomorrow.  9.  Bradycardia with  sinus pause Patient noted to have a bradycardia with heart rates in the 30s per RN.  Patient asymptomatic.  EKG done showing a first-degree AV block.  TSH will be ordered.  Cardiac enzymes to be ordered.  Check a 2D echo.  Decrease dose of Lopressor to 12.5 mg twice daily as patient with history of hyperthyroidism and monitor heart rate on  monitor.  Case discussed with cardiology.   DVT prophylaxis: SCDs Code Status: Full Family Communication: No family at bedside.  Updated daughter, [name] via telephone. Disposition Plan: To be determined.   Consultants:   Gastroenterology: Dr. Lyndel Safe 05/02/2018  Procedures:   None  Antimicrobials:   None   Subjective: Patient sitting up in bed watching television.  Nonverbal.  Nods head to say yes and shakes head to say no.  Denies any chest pain, no shortness of breath, no further bleeding, no abdominal pain.  Tolerated soft diet.  Per RN patient noted to be bradycardic with heart rates in the 30s and noted on telemetry to have a 3.5-second pause.  Patient with no symptoms of lightheadedness or dizziness or syncope.   Objective: Vitals:   05/03/18 2001 05/03/18 2108 05/04/18 0555 05/04/18 1100  BP: (!) 152/96 (!) 138/111 (!) 136/99 (!) 176/79  Pulse: 94 94 76 65  Resp:   15 18  Temp:  98.2 F (36.8 C) 97.8 F (36.6 C) 97.9 F (36.6 C)  TempSrc:  Oral Oral Oral  SpO2:  100% 98% 100%  Weight:      Height:        Intake/Output Summary (Last 24 hours) at 05/04/2018 1200 Last data filed at 05/04/2018 1042 Gross per 24 hour  Intake 418 ml  Output 400 ml  Net 18 ml   Filed Weights   05/02/18 0252  Weight: 110 kg    Examination:  General exam: NAD Respiratory system: Lungs clear to auscultation bilaterally.  No wheezes, no crackles, no rhonchi.  Respiratory effort normal. Cardiovascular system: Bradycardia.  No JVD.  No murmurs rubs or gallops.  No lower extremity edema.  Gastrointestinal system: Abdomen is soft, nontender, nondistended, positive bowel sounds.  No rebound.  No guarding.  Central nervous system: Alert. No focal neurological deficits. Extremities: Symmetric 5 x 5 power. Skin: No rashes, lesions or ulcers Psychiatry: Judgement and insight appear normal. Mood & affect appropriate.     Data Reviewed: I have personally reviewed following labs and imaging  studies  CBC: Recent Labs  Lab 05/02/18 0300 05/02/18 1004 05/03/18 0420 05/04/18 0525  WBC 9.8 12.0* 8.5 8.1  NEUTROABS 6.7  --   --   --   HGB 11.4* 11.6* 11.7* 10.0*  HCT 38.5 37.8 38.1 33.6*  MCV 87.5 84.9 84.7 86.6  PLT 255 246 190 440   Basic Metabolic Panel: Recent Labs  Lab 05/02/18 0300 05/03/18 0420 05/04/18 0525  NA 140 141 142  K 4.1 4.8 3.6  CL 100 104 107  CO2 28 20* 24  GLUCOSE 129* 125* 122*  BUN 22 19 15   CREATININE 1.64* 1.40* 1.30*  CALCIUM 9.9 9.3 9.1   GFR: Estimated Creatinine Clearance: 56.1 mL/min (A) (by C-G formula based on SCr of 1.3 mg/dL (H)). Liver Function Tests: Recent Labs  Lab 05/02/18 0300 05/03/18 0420  AST 20 22  ALT 11 <5  ALKPHOS 123 114  BILITOT 0.7 0.7  PROT 7.3 6.7  ALBUMIN 3.1* 3.1*   No results for input(s): LIPASE, AMYLASE in the last 168 hours. No results for  input(s): AMMONIA in the last 168 hours. Coagulation Profile: Recent Labs  Lab 05/03/18 0420  INR 1.09   Cardiac Enzymes: No results for input(s): CKTOTAL, CKMB, CKMBINDEX, TROPONINI in the last 168 hours. BNP (last 3 results) No results for input(s): PROBNP in the last 8760 hours. HbA1C: No results for input(s): HGBA1C in the last 72 hours. CBG: Recent Labs  Lab 05/03/18 0749 05/03/18 1339 05/03/18 1659 05/03/18 2104 05/04/18 0739  GLUCAP 149* 100* 124* 172* 118*   Lipid Profile: No results for input(s): CHOL, HDL, LDLCALC, TRIG, CHOLHDL, LDLDIRECT in the last 72 hours. Thyroid Function Tests: No results for input(s): TSH, T4TOTAL, FREET4, T3FREE, THYROIDAB in the last 72 hours. Anemia Panel: No results for input(s): VITAMINB12, FOLATE, FERRITIN, TIBC, IRON, RETICCTPCT in the last 72 hours. Sepsis Labs: No results for input(s): PROCALCITON, LATICACIDVEN in the last 168 hours.  Recent Results (from the past 240 hour(s))  Urine culture     Status: Abnormal   Collection Time: 05/02/18  6:24 AM  Result Value Ref Range Status   Specimen  Description URINE, CATHETERIZED  Final   Special Requests   Final    NONE Performed at [facility], [address] [place],  [address]    Culture MULTIPLE SPECIES PRESENT, SUGGEST RECOLLECTION (A)  Final   Report Status 05/03/2018 FINAL  Final         Radiology Studies: US Pelvic Complete With Transvaginal  Result Date: 05/03/2018 CLINICAL DATA:  Study is limited due to patient body habitus and the patient's inability to tolerate the transvaginal probe. EXAM: TRANSABDOMINAL AND TRANSVAGINAL ULTRASOUND OF PELVIS TECHNIQUE: Both transabdominal and transvaginal ultrasound examinations of the pelvis were performed. Transabdominal technique was performed for global imaging of the pelvis including uterus, ovaries, adnexal regions, and pelvic cul-de-sac. It was necessary to proceed with endovaginal exam following the transabdominal exam to visualize the endometrium and ovaries. COMPARISON:  None FINDINGS: Uterus Measurements: 12.3 x 8.0 x 8.4 cm. There appear to be 2 masses in the uterus measuring 3.5 x 2.3 x 3.2 cm and 4.3 x 3.9 x 4.3 cm. Endometrium Thickness: 21.1 mm.  There is fluid in the endometrial canal. Right ovary Not visualized. Left ovary Not visualized. Other findings No abnormal free fluid. IMPRESSION: 1. The study is significantly limited due to the patient's body habitus and the patient's inability to tolerate the vaginal probe. 2. The endometrium appears to be significantly thickened measuring up to 21.1 mm. There is fluid in the canal. In the setting of post-menopausal bleeding, endometrial sampling is indicated to exclude carcinoma. If results are benign, sonohysterogram should be considered for focal lesion work-up. (Ref: Radiological Reasoning: Algorithmic Workup of Abnormal Vaginal Bleeding with Endovaginal Sonography and Sonohysterography. AJR 2008; [id number]:F81-82) 3. Two apparent fibroids. Electronically Signed   By: Dorise Bullion III M.D   On: 05/03/2018 12:42         Scheduled Meds: . bisacodyl  10 mg Rectal QODAY  . budesonide  0.5 mg Nebulization BID  . cholecalciferol  1,000 Units Oral Daily  . insulin aspart  0-9 Units Subcutaneous TID WC  . megestrol  80 mg Oral BID  . memantine  10 mg Oral BID  . methimazole  10 mg Oral Daily  . metoprolol tartrate  50 mg Oral BID  . pantoprazole  40 mg Oral Q0600  . polyethylene glycol  17 g Oral Daily  . senna-docusate  1 tablet Oral BID  . sorbitol, milk of mag, mineral oil, glycerin (SMOG)  enema  960 mL Rectal Once   Continuous Infusions:   LOS: 2 days    Time spent: 40 minutes    Irine Seal, MD [facility] Pager [phone]  If 7PM-7AM, please contact night-coverage [email or web address] Password [email or web address] 05/04/2018, 12:00 PM

## 2018-05-04 NOTE — Evaluation (Signed)
Physical Therapy Evaluation Patient Details Name: Angelica Beck MRN: [id number] DOB: 09-Jun-1951 Today's Date: 05/04/2018   History of Present Illness  Pt is a 67 y/o female with PMH significant of diabetes, hypertension, hyperthyroidism, history of stroke,chf,ckd stage III vascular dementia admitted with complaints of passing clots via rectum 3-4 times since yesterday. Pt found to have fecal impaction with rectal bleeding and questionable vaginal bleeding.    Clinical Impression  Pt presented supine in bed with HOB elevated, awake and willing to participate in therapy session. Prior to admission, pt reported that she was able to perform transfers independently and mobilize herself in a manual w/c. Pt with cognitive deficits at baseline and no family/caregivers to confirm information provided. Pt currently requires max A x2 for bed mobility and total A x2 to attempt standing with use of Stedy. Pt with two unsuccessful attempts to stand; therefore, will require mechanical lift at this time. Pt would continue to benefit from skilled physical therapy services at this time while admitted and after d/c to address the below listed limitations in order to improve overall safety and independence with functional mobility.     Follow Up Recommendations SNF    Equipment Recommendations  None recommended by PT    Recommendations for Other Services       Precautions / Restrictions Precautions Precautions: Fall Restrictions Weight Bearing Restrictions: No      Mobility  Bed Mobility Overal bed mobility: Needs Assistance Bed Mobility: Supine to Sit;Sit to Supine     Supine to sit: Max assist;+2 for physical assistance Sit to supine: Max assist;+2 for physical assistance   General bed mobility comments: increased time and effort, verbal and tactile cueing for sequencing, assist with bilateral LE movement onto and off of bed, assistance with trunk elevation as well  Transfers Overall transfer  level: Needs assistance Equipment used: (STEDY) Transfers: Sit to/from Stand Sit to Stand: Total assist;+2 physical assistance;From elevated surface         General transfer comment: attempted sit<>stand x2 from EOB; however, even with total A x2, pt unable to achieve standing position or even clear buttocks off of bed. pt with reports of R knee pain with attempt to stand  Ambulation/Gait                Stairs            Wheelchair Mobility    Modified Rankin (Stroke Patients Only)       Balance Overall balance assessment: Needs assistance Sitting-balance support: Feet supported Sitting balance-Leahy Scale: Fair     Standing balance support: During functional activity;Bilateral upper extremity supported Standing balance-Leahy Scale: Poor                               Pertinent Vitals/Pain Pain Assessment: Faces Faces Pain Scale: Hurts even more Pain Location: R knee Pain Descriptors / Indicators: Sore;Grimacing;Guarding Pain Intervention(s): Monitored during session;Repositioned    Home Living Family/patient expects to be discharged to:: Private residence Living Arrangements: Children(Daughter) Available Help at Discharge: Family;Personal care attendant;Available PRN/intermittently Type of Home: House Home Access: Level entry     Home Layout: Two level Home Equipment: Walker - 2 wheels;Wheelchair - manual Additional Comments: Unsure of reliability of information provided by patient, no family/caregivers present to confirm    Prior Function Level of Independence: Needs assistance   Gait / Transfers Assistance Needed: pt reported that she uses a w/c and can self-propel  ADL's /  Homemaking Assistance Needed: pt stated that her daughter does all of her ADLs for her  Comments: Unsure of reliability of information provided by patient, no family/caregivers present to confirm     Hand Dominance        Extremity/Trunk Assessment   Upper  Extremity Assessment Upper Extremity Assessment: Generalized weakness;Defer to OT evaluation    Lower Extremity Assessment Lower Extremity Assessment: Generalized weakness    Cervical / Trunk Assessment Cervical / Trunk Assessment: Kyphotic  Communication      Cognition Arousal/Alertness: Awake/alert Behavior During Therapy: Flat affect Overall Cognitive Status: No family/caregiver present to determine baseline cognitive functioning Area of Impairment: Memory;Following commands;Safety/judgement;Problem solving                     Memory: Decreased short-term memory Following Commands: Follows one step commands inconsistently;Follows one step commands with increased time Safety/Judgement: Decreased awareness of deficits;Decreased awareness of safety   Problem Solving: Slow processing;Decreased initiation;Difficulty sequencing;Requires verbal cues;Requires tactile cues        General Comments      Exercises     Assessment/Plan    PT Assessment Patient needs continued PT services  PT Problem List Decreased strength;Decreased range of motion;Decreased activity tolerance;Decreased balance;Decreased coordination;Decreased mobility;Decreased cognition;Decreased knowledge of use of DME;Decreased safety awareness;Decreased knowledge of precautions;Pain       PT Treatment Interventions DME instruction;Gait training;Stair training;Functional mobility training;Therapeutic activities;Therapeutic exercise;Neuromuscular re-education;Balance training;Cognitive remediation;Patient/family education;Wheelchair mobility training    PT Goals (Current goals can be found in the Care Plan section)  Acute Rehab PT Goals Patient Stated Goal: unable to state PT Goal Formulation: Patient unable to participate in goal setting Time For Goal Achievement: 05/18/18 Potential to Achieve Goals: Fair    Frequency Min 2X/week   Barriers to discharge        Co-evaluation PT/OT/SLP  Co-Evaluation/Treatment: Yes Reason for Co-Treatment: Necessary to address cognition/behavior during functional activity;For patient/therapist safety;To address functional/ADL transfers PT goals addressed during session: Mobility/safety with mobility;Balance;Proper use of DME;Strengthening/ROM         AM-PAC PT "6 Clicks" Daily Activity  Outcome Measure Difficulty turning over in bed (including adjusting bedclothes, sheets and blankets)?: Unable Difficulty moving from lying on back to sitting on the side of the bed? : Unable Difficulty sitting down on and standing up from a chair with arms (e.g., wheelchair, bedside commode, etc,.)?: Unable Help needed moving to and from a bed to chair (including a wheelchair)?: Total Help needed walking in hospital room?: Total Help needed climbing 3-5 steps with a railing? : Total 6 Click Score: 6    End of Session Equipment Utilized During Treatment: Gait belt Activity Tolerance: Patient limited by pain;Patient limited by fatigue Patient left: in bed;with call bell/phone within reach;with bed alarm set Nurse Communication: Mobility status;Need for lift equipment PT Visit Diagnosis: Other abnormalities of gait and mobility (R26.89);Pain Pain - Right/Left: Right Pain - part of body: Knee    Time: 1413-1444 PT Time Calculation (min) (ACUTE ONLY): 31 min   Charges:   PT Evaluation $PT Eval Moderate Complexity: [place], [place], [place] 05/04/2018, 3:39 PM

## 2018-05-04 NOTE — Evaluation (Signed)
Occupational Therapy Evaluation Patient Details Name: Nikeya Beck MRN: [id number] DOB: 1951/01/09 Today's Date: 05/04/2018    History of Present Illness Pt is a 67 y/o female with PMH significant of diabetes, hypertension, hyperthyroidism, history of stroke,chf,ckd stage III vascular dementia admitted with complaints of passing clots via rectum 3-4 times since yesterday. Pt found to have fecal impaction with rectal bleeding and questionable vaginal bleeding.   Clinical Impression   Per chart review, pt was living with her daughter and required assistance for ADLs. Pt reporting her daughter assists with ADLs and she transfers to a w/c independently; unsure of reliability due to inconsistency of information. Pt currently requiring Max-Total A for dressing, bathing, and toileting. Pt abel to maintain sitting at EOB and washing her face with Min guard. Pt attempting sit<>stand with sara stedy and required Total A +2. Pt would benefit from further acute OT to facilitate safe dc. Recommend dc to SNF for further OT to optimize safety, independence with ADLs, and return to PLOF.      Follow Up Recommendations  SNF;Supervision/Assistance - 24 hour    Equipment Recommendations  None recommended by OT    Recommendations for Other Services PT consult     Precautions / Restrictions Precautions Precautions: Fall Restrictions Weight Bearing Restrictions: No      Mobility Bed Mobility Overal bed mobility: Needs Assistance Bed Mobility: Supine to Sit;Sit to Supine     Supine to sit: Max assist;+2 for physical assistance Sit to supine: Max assist;+2 for physical assistance   General bed mobility comments: increased time and effort, verbal and tactile cueing for sequencing, assist with bilateral LE movement onto and off of bed, assistance with trunk elevation as well  Transfers Overall transfer level: Needs assistance Equipment used: (STEDY) Transfers: Sit to/from Stand Sit to Stand:  Total assist;+2 physical assistance;From elevated surface         General transfer comment: attempted sit<>stand x2 from EOB; however, even with total A x2, pt unable to achieve standing position or even clear buttocks off of bed. pt with reports of R knee pain with attempt to stand    Balance Overall balance assessment: Needs assistance Sitting-balance support: Feet supported Sitting balance-Leahy Scale: Fair     Standing balance support: During functional activity;Bilateral upper extremity supported Standing balance-Leahy Scale: Poor                             ADL either performed or assessed with clinical judgement   ADL Overall ADL's : Needs assistance/impaired Eating/Feeding: Set up;Bed level   Grooming: Wash/dry face;Sitting;Set up;Supervision/safety   Upper Body Bathing: Sitting;Maximal assistance   Lower Body Bathing: Maximal assistance;Total assistance;Bed level   Upper Body Dressing : Maximal assistance;Sitting;Bed level   Lower Body Dressing: Maximal assistance;Total assistance;Bed level                 General ADL Comments: Pt requiring Max-Total A for dressing, bathing, and toileting. Pt able to maintain sitting balance at EOB and wash her face.     Vision         Perception     Praxis      Pertinent Vitals/Pain Pain Assessment: Faces Faces Pain Scale: Hurts even more Pain Location: R knee Pain Descriptors / Indicators: Sore;Grimacing;Guarding Pain Intervention(s): Monitored during session;Limited activity within patient's tolerance;Repositioned     Hand Dominance Right   Extremity/Trunk Assessment Upper Extremity Assessment Upper Extremity Assessment: Generalized weakness   Lower Extremity Assessment  Lower Extremity Assessment: Generalized weakness   Cervical / Trunk Assessment Cervical / Trunk Assessment: Kyphotic;Other exceptions Cervical / Trunk Exceptions: Increased body habitus   Communication  Communication Communication: No difficulties   Cognition Arousal/Alertness: Awake/alert Behavior During Therapy: Flat affect Overall Cognitive Status: No family/caregiver present to determine baseline cognitive functioning Area of Impairment: Memory;Following commands;Safety/judgement;Problem solving                     Memory: Decreased short-term memory Following Commands: Follows one step commands inconsistently;Follows one step commands with increased time Safety/Judgement: Decreased awareness of deficits;Decreased awareness of safety   Problem Solving: Slow processing;Decreased initiation;Difficulty sequencing;Requires verbal cues;Requires tactile cues General Comments: Pt requiring increased cues and time. Pt with inconsistant information during interview about home set up. Pt stating at one time that she does not have an aide and then stating her aide assists her with ADLs. Pt with decreased problem solving for bed mobility and requiring increased cues   General Comments       Exercises     Shoulder Instructions      Home Living Family/patient expects to be discharged to:: Private residence Living Arrangements: Children(Daughter) Available Help at Discharge: Family;Personal care attendant;Available PRN/intermittently Type of Home: House Home Access: Level entry     Home Layout: Two level     Bathroom Shower/Tub: Tub/shower unit         Home Equipment: Environmental consultant - 2 wheels;Wheelchair - manual   Additional Comments: Unsure of reliability of information provided by patient, no family/caregivers present to confirm      Prior Functioning/Environment Level of Independence: Needs assistance  Gait / Transfers Assistance Needed: pt reported that she uses a w/c and can self-propel ADL's / Homemaking Assistance Needed: pt stated that her daughter does all of her ADLs for her Communication / Swallowing Assistance Needed: pt stated that she can feed herself Comments:  Unsure of reliability of information provided by patient due to inconsistancy, no family/caregivers present to confirm        OT Problem List: Decreased strength;Decreased range of motion;Decreased activity tolerance;Impaired balance (sitting and/or standing);Decreased cognition;Decreased safety awareness;Decreased knowledge of use of DME or AE;Decreased knowledge of precautions;Pain      OT Treatment/Interventions: Self-care/ADL training;Therapeutic exercise;DME and/or AE instruction;Energy conservation;Therapeutic activities;Patient/family education    OT Goals(Current goals can be found in the care plan section) Acute Rehab OT Goals Patient Stated Goal: unable to state OT Goal Formulation: Patient unable to participate in goal setting Time For Goal Achievement: 05/18/18 Potential to Achieve Goals: Good ADL Goals Pt Will Perform Upper Body Bathing: with min assist;sitting Pt Will Perform Upper Body Dressing: with min assist;sitting Pt Will Transfer to Toilet: with max assist;with +2 assist;bedside commode;squat pivot transfer(with or without sara stedy) Additional ADL Goal #1: Pt will perform bed mobility with Mod A +2 in preparation for ADLs Additional ADL Goal #2: Pt will perform ADL task with Mod verbal cues for sequencing  OT Frequency: Min 2X/week   Barriers to D/C:            Co-evaluation PT/OT/SLP Co-Evaluation/Treatment: Yes Reason for Co-Treatment: Necessary to address cognition/behavior during functional activity;To address functional/ADL transfers PT goals addressed during session: Mobility/safety with mobility;Balance;Proper use of DME;Strengthening/ROM OT goals addressed during session: ADL's and self-care      AM-PAC PT "6 Clicks" Daily Activity     Outcome Measure Help from another person eating meals?: A Little Help from another person taking care of personal grooming?: A Little Help from  another person toileting, which includes using toliet, bedpan, or  urinal?: Total Help from another person bathing (including washing, rinsing, drying)?: A Lot Help from another person to put on and taking off regular upper body clothing?: A Lot Help from another person to put on and taking off regular lower body clothing?: Total 6 Click Score: 12   End of Session Equipment Utilized During Treatment: Gait belt Nurse Communication: Mobility status  Activity Tolerance: Patient limited by pain;Patient limited by fatigue Patient left: in bed;with call bell/phone within reach;with bed alarm set  OT Visit Diagnosis: Unsteadiness on feet (R26.81);Other abnormalities of gait and mobility (R26.89);Muscle weakness (generalized) (M62.81);Pain;Other symptoms and signs involving cognitive function Pain - Right/Left: Right Pain - part of body: Leg                Time: 9371-6967 OT Time Calculation (min): 23 min Charges:  OT General Charges $OT Visit: 1 Visit OT Evaluation $OT Eval Moderate Complexity: [place], OTR/L Acute Rehab Pager: [phone] Office: [place] 05/04/2018, 4:10 PM

## 2018-05-04 NOTE — Progress Notes (Signed)
OT Cancellation Note  Patient Details Name: Angelica Beck MRN: [id number] DOB: 24-Aug-1951   Cancelled Treatment:    Reason Eval/Treat Not Completed: Patient at procedure or test/ unavailable(Ultrasound. Will return as schedule allows.)  [place], OTR/L Acute Rehab Pager: [phone] Office: [phone] 05/04/2018, 8:57 AM

## 2018-05-04 NOTE — Progress Notes (Signed)
Troponin critical lab value -0.14. Dr. Grandville Silos was made aware

## 2018-05-05 ENCOUNTER — Inpatient Hospital Stay (HOSPITAL_COMMUNITY): Payer: Medicare Other

## 2018-05-05 DIAGNOSIS — N950 Postmenopausal bleeding: Secondary | ICD-10-CM | POA: Diagnosis present

## 2018-05-05 DIAGNOSIS — R931 Abnormal findings on diagnostic imaging of heart and coronary circulation: Secondary | ICD-10-CM

## 2018-05-05 DIAGNOSIS — I5030 Unspecified diastolic (congestive) heart failure: Secondary | ICD-10-CM

## 2018-05-05 LAB — CBC WITH DIFFERENTIAL/PLATELET
Abs Immature Granulocytes: 0.1 10*3/uL (ref 0.0–0.1)
BASOS PCT: 0 %
Basophils Absolute: 0 10*3/uL (ref 0.0–0.1)
EOS ABS: 0.3 10*3/uL (ref 0.0–0.7)
EOS PCT: 3 %
HCT: 32.3 % — ABNORMAL LOW (ref 36.0–46.0)
Hemoglobin: 9.8 g/dL — ABNORMAL LOW (ref 12.0–15.0)
Immature Granulocytes: 1 %
Lymphocytes Relative: 19 %
Lymphs Abs: 2 10*3/uL (ref 0.7–4.0)
MCH: 26.2 pg (ref 26.0–34.0)
MCHC: 30.3 g/dL (ref 30.0–36.0)
MCV: 86.4 fL (ref 78.0–100.0)
MONO ABS: 0.9 10*3/uL (ref 0.1–1.0)
Monocytes Relative: 8 %
NEUTROS PCT: 69 %
Neutro Abs: 7.2 10*3/uL (ref 1.7–7.7)
PLATELETS: 202 10*3/uL (ref 150–400)
RBC: 3.74 MIL/uL — ABNORMAL LOW (ref 3.87–5.11)
RDW: 14.5 % (ref 11.5–15.5)
WBC: 10.5 10*3/uL (ref 4.0–10.5)

## 2018-05-05 LAB — BASIC METABOLIC PANEL
Anion gap: 10 (ref 5–15)
BUN: 18 mg/dL (ref 8–23)
CALCIUM: 8.6 mg/dL — AB (ref 8.9–10.3)
CO2: 21 mmol/L — ABNORMAL LOW (ref 22–32)
Chloride: 103 mmol/L (ref 98–111)
Creatinine, Ser: 1.82 mg/dL — ABNORMAL HIGH (ref 0.44–1.00)
GFR calc Af Amer: 32 mL/min — ABNORMAL LOW (ref >60)
GFR calc non Af Amer: 28 mL/min — ABNORMAL LOW (ref >60)
GLUCOSE: 162 mg/dL — AB (ref 70–99)
Potassium: 4.8 mmol/L (ref 3.5–5.1)
Sodium: 134 mmol/L — ABNORMAL LOW (ref 135–145)

## 2018-05-05 LAB — ECHOCARDIOGRAM COMPLETE
HEIGHTINCHES: 68 in
WEIGHTICAEL: 3880.1 [oz_av]

## 2018-05-05 LAB — GLUCOSE, CAPILLARY
GLUCOSE-CAPILLARY: 176 mg/dL — AB (ref 70–99)
Glucose-Capillary: 153 mg/dL — ABNORMAL HIGH (ref 70–99)
Glucose-Capillary: 132 mg/dL — ABNORMAL HIGH (ref 70–99)
Glucose-Capillary: 177 mg/dL — ABNORMAL HIGH (ref 70–99)

## 2018-05-05 LAB — TROPONIN I

## 2018-05-05 MED ORDER — INSULIN DETEMIR 100 UNIT/ML ~~LOC~~ SOLN
5.0000 [IU] | Freq: Every day | SUBCUTANEOUS | Status: DC
  Administered 2018-05-05 – 2018-05-08 (×4): 5 [IU] via SUBCUTANEOUS
  Filled 2018-05-05 (×4): qty 0.05

## 2018-05-05 MED ORDER — MEGESTROL ACETATE 40 MG PO TABS
40.0000 mg | ORAL_TABLET | Freq: Two times a day (BID) | ORAL | Status: DC
  Administered 2018-05-05 – 2018-05-09 (×9): 40 mg via ORAL
  Filled 2018-05-05 (×9): qty 1

## 2018-05-05 NOTE — Plan of Care (Signed)
  Problem: Activity: Goal: Risk for activity intolerance will decrease Outcome: Progressing   Problem: Activity: Goal: Risk for activity intolerance will decrease Outcome: Progressing   

## 2018-05-05 NOTE — Progress Notes (Signed)
PROGRESS NOTE    Legacy Carrender  PFX:[id number] DOB: 1950/12/25 DOA: 05/02/2018 PCP: Clovia Cuff, MD    Brief Narrative: Angelica Beck is a 67 y.o. female with medical history significant of diabetes, hypertension, hyperthyroidism, history of stroke,chf,ckd stage III vascular dementia admitted with complaints of passing clots via rectum 3-4 times since yesterday.  Patient lives at home with her daughter who takes care of her 24 hours.  No history of nausea vomiting hematemesis or hematuria.  Daughter reported that she is never had anything like this in the past no known history of hemorrhoids diverticulitis .  Patient has had a history of bowel obstruction in the past. she denies any fever, chills, cough, abdominal pain.denies any chest pain shortness of breath. Patient has had a history of long hospital stay complicated by small bowel obstruction trach and PEG in the past.  ED Course: Her blood pressure was 148 111 pulse was 57 respirations 17 sats 95% on room air.  Were significant for hemoglobin of 11.4 which is better than before though she appears somewhat dry normal platelets white count 9.8 sodium 140 potassium 4.1 BUN 22 creatinine 1.64 which is her baseline.   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  Patient was seen by gastroenterology and smog enema ordered.   Patient with bowel movement.  Patient seen by GI PA the morning of 05/03/2018, who noted resolution of fecal impaction as rectal vault with thin light brown liquid residuals with no overt bleeding.  Advanced diet to a soft diet which patient is tolerating.  GI following and appreciate input and recommendations.  2.  Postmenopausal bleeding. Patient noted the evening of 05/03/2018 to have clots coming from her vagina.  Pelvic ultrasound done was concerning for endometrial thickening as well as masses noted on the uterus.  Patient started on Megace 80 mg twice daily which has been decreased to 40 mg twice daily per GYN recommendations.  Outpatient follow-up with GYN for probable endometrial sampling and further evaluation.  GYN consulted.   3.  Constipation/impaction Resolved.  Patient received a smog enema.  Placed on MiraLAX daily as well as Senokot as twice daily and Dulcolax suppository every other day per GI recommendations.  4.  Hyperthyroidism/goiter Decreased Lopressor to 12.5 mg twice daily due to bradycardia and sinus pause of 3.5 noted the morning of 05/04/2018.  Continue methimazole.  Outpatient follow-up.    5.  Dementia due to Parkinson's disease Stable.  Resumed home regimen Namenda.  6.  Diabetes mellitus  Last hemoglobin A1c was 7.4 on 03/10/2017.  CBG this morning was 153.  Continue sliding scale insulin.  Continue Levemir 10 units daily.  7.  Hypertension Stable.  Patient placed on Lopressor however patient noted to be severely bradycardic the morning of 05/04/2018, with 3.5-second pause and as such Lopressor dose decreased.  Continue hydralazine for blood pressure control.    8.  History of diastolic CHF Diuretics on hold.  Patient currently asymptomatic.  Will resume diuretics today.    9.  Bradycardia with sinus pause Patient noted to have a bradycardia with heart rates in the 30s per RN on 05/04/2018.  Patient asymptomatic.  EKG done showing a first-degree AV block.  TSH within  normal limits at 3.481.  2 sets of cardiac enzymes were negative and one at 0.14 likely not accurate.  2D echo was done with a normal EF, diastolic dysfunction, nodule noted on aortic valve that was mobile and per cardiology concerning for possible endocarditis.  Will check blood cultures x2.  Check a TEE.  Bradycardia improved on decreased dose of Lopressor 12.5 mg twice daily.  Monitor closely.  10.  Abnormal 2D echo 2D echo with a EF of 60 to 00%, grade 1 diastolic dysfunction, nodule noted on aortic valve with some mobility.  Concern for endocarditis.  Patient however afebrile.  Will check blood cultures x2.  Will need a TEE.  Will make n.p.o. after midnight in anticipation of possible TEE.   11.  Chronic kidney disease stage III Stable.  Follow.   DVT prophylaxis: SCDs Code Status: Full Family Communication: No family at bedside.  Updated daughter, [name] via telephone. Disposition Plan: To be determined.   Consultants:   Gastroenterology: Dr. Lyndel Safe 05/02/2018  GYN: Dr. Nehemiah Settle 05/05/2018  Procedures:   2D echo 05/05/2018  Antimicrobials:   None   Subjective: Patient in bed.  Denies any chest pain.  No shortness of breath.  Tolerating current diet.  Bradycardia improved.  No dizziness, lightheadedness or syncope.   Objective: Vitals:   05/04/18 1945 05/04/18 2003 05/05/18 0445 05/05/18 0618  BP:  120/76 137/76 132/79  Pulse:  79 81   Resp:  20 20   Temp:  98.2 F (36.8 C) 97.9 F (36.6 C)   TempSrc:  Oral Oral   SpO2: 98% 98% 97%   Weight:      Height:        Intake/Output Summary (Last 24 hours) at 05/05/2018 1241 Last data filed at 05/05/2018 1236 Gross per 24 hour  Intake 582 ml  Output 500 ml  Net 82 ml   Filed Weights   05/02/18 0252  Weight: 110 kg    Examination:  General exam: NAD.  Large goiter Respiratory system: Lungs clear to auscultation bilaterally.  No wheezes, no crackles, no rhonchi.  Respiratory effort normal. Cardiovascular system:  Regular rate rhythm no murmurs rubs or gallops.  No JVD.  No lower extremity edema.   Gastrointestinal system: Abdomen is soft, nontender, nondistended, positive bowel sounds.  No rebound.  No guarding.  Central nervous system: Alert. No focal neurological deficits. Extremities: Symmetric 5 x 5 power. Skin: No rashes, lesions or ulcers Psychiatry: Judgement and insight appear normal. Mood & affect appropriate.     Data Reviewed: I have personally reviewed following labs and imaging studies  CBC: Recent Labs  Lab 05/02/18 0300 05/02/18 1004 05/03/18 0420 05/04/18 0525 05/05/18 0004  WBC 9.8 12.0* 8.5 8.1 10.5  NEUTROABS 6.7  --   --   --  7.2  HGB 11.4* 11.6* 11.7* 10.0* 9.8*  HCT 38.5 37.8 38.1 33.6* 32.3*  MCV 87.5 84.9 84.7 86.6 86.4  PLT 255 246 190 205 938   Basic Metabolic Panel: Recent Labs  Lab 05/02/18 0300 05/03/18 0420 05/04/18 0525 05/05/18 0004  NA 140 141 142 134*  K 4.1 4.8 3.6 4.8  CL 100 104 107 103  CO2 28 20* 24 21*  GLUCOSE 129* 125* 122* 162*  BUN 22 19 15 18   CREATININE 1.64* 1.40* 1.30* 1.82*  CALCIUM 9.9 9.3 9.1 8.6*   GFR: Estimated Creatinine Clearance: 40 mL/min (A) (by C-G formula based on SCr of 1.82 mg/dL (H)). Liver Function Tests: Recent Labs  Lab 05/02/18 0300 05/03/18 0420  AST 20 22  ALT 11 <5  ALKPHOS 123 114  BILITOT 0.7 0.7  PROT 7.3 6.7  ALBUMIN 3.1* 3.1*   No results for input(s): LIPASE, AMYLASE in the last 168 hours. No results for input(s): AMMONIA in the last 168 hours. Coagulation Profile: Recent Labs  Lab 05/03/18 0420  INR 1.09   Cardiac Enzymes: Recent Labs  Lab 05/04/18 1210 05/04/18 1709 05/05/18 0004  TROPONINI <0.03 0.14* <0.03   BNP (last 3 results) No results for input(s): PROBNP in the last 8760 hours. HbA1C: No results for input(s): HGBA1C in the last 72 hours. CBG: Recent Labs  Lab 05/04/18 1214 05/04/18 1718 05/04/18 2102 05/05/18 0742 05/05/18 1213  GLUCAP 208* 168* 124* 153*  132*   Lipid Profile: No results for input(s): CHOL, HDL, LDLCALC, TRIG, CHOLHDL, LDLDIRECT in the last 72 hours. Thyroid Function Tests: Recent Labs    05/04/18 1210  TSH 3.481   Anemia Panel: No results for input(s): VITAMINB12, FOLATE, FERRITIN, TIBC, IRON, RETICCTPCT in the last 72 hours. Sepsis Labs: No results for input(s): PROCALCITON, LATICACIDVEN in the last 168 hours.  Recent Results (from the past 240 hour(s))  Urine culture     Status: Abnormal   Collection Time: 05/02/18  6:24 AM  Result Value Ref Range Status   Specimen Description URINE, CATHETERIZED  Final   Special Requests   Final    NONE Performed at [facility], [address], [place], [place] [address]    Culture MULTIPLE SPECIES PRESENT, SUGGEST RECOLLECTION (A)  Final   Report Status 05/03/2018 FINAL  Final         Radiology Studies: No results found.      Scheduled Meds: . bisacodyl  10 mg Rectal QODAY  . budesonide  0.5 mg Nebulization BID  . cholecalciferol  1,000 Units Oral Daily  . furosemide  20 mg Oral Daily  . hydrALAZINE  25 mg Oral Q8H  . insulin aspart  0-9 Units Subcutaneous TID WC  . insulin detemir  10 Units Subcutaneous QHS  . megestrol  40 mg Oral BID  . memantine  10 mg Oral BID  . methimazole  10 mg Oral Daily  . metoprolol tartrate  12.5 mg Oral BID  . pantoprazole  40 mg Oral Q0600  . polyethylene glycol  17 g Oral Daily  . senna-docusate  1 tablet Oral BID  . sorbitol, milk of mag, mineral oil, glycerin (SMOG) enema  960 mL Rectal Once   Continuous Infusions:   LOS: 3 days    Time spent: 40 minutes    Irine Seal, MD [facility] Pager [phone]  If 7PM-7AM, please contact night-coverage [email or web address] Password [facility] 05/05/2018, 12:41 PM

## 2018-05-05 NOTE — Progress Notes (Signed)
  Echocardiogram 2D Echocardiogram has been performed.  Angelica Beck 05/05/2018, 2:29 PM

## 2018-05-05 NOTE — Consult Note (Signed)
[facility] OB/GYN Attending Consult Note   Consult Date: 05/05/2018  Reason for Consult:Postmenopausal bleeding Referring Physician: Dr Grandville Silos   Angelica Beck is an 67 y.o. with a past medical history that includes diabetes, dementia, renal insufficiency, hypertension, chronic systolic heart failure with a EF of 60-65%, osteoarthritis hips, nonambulatory status, thyroid goiter.  She was admitted for concerns of rectal bleeding, but observations show that her bleeding was possibly from her vagina.  Ultrasound was performed which showed an endometrial thickness of 21 mm.  I was called by Dr. Grandville Silos yesterday and discussed the patient with him.  I recommended starting her on Megace 80 mg twice a day.  Nurses note very little bleeding over night.  Patient states that she has never had bleeding before.  Uncertain as to when she went through menopause.  Assessment/Plan: 1. AUB - Postmenopausal bleeding 1. Decrease megace to 44m BID. 2. At high risk of endometrial cancer. 2. Thickened endometrium 1. UKoreaimages reviewed -very poor quality. 2. Endometrial thickness 21 mm 3. Concerning for endometrial carcinoma 4. Will need outpatient endometrial biopsy 3. Dementia 4. Renal insufficiency 1. ? At baseline 5. Anemia 1. Stable 6. Hypertension 1. Stable 7. Diabetes 1. Management by primary team. 8. Chronic respiratory failure 9. Chronic systolic heart failure EF of 60-65% on echo 2017   Appreciate care of STiffani Kadowby her primary team  Please call [phone]([facility] Attending on call) for any gynecologic concerns at any time.  Thank you for involving u[place]oreain the care of this patient.   Pertinent OB/GYN History: No LMP recorded. Patient is postmenopausal.  Patient Active Problem List   Diagnosis Date Noted  . Endometrial thickening on ultrasound 05/04/2018  . Bradycardia 05/04/2018  . Vaginal bleeding 05/03/2018  . Rectal bleeding  05/02/2018  . GI bleed 05/02/2018  . Dementia due to Parkinson's disease without behavioral disturbance (HBoyertown 05/02/2018  . Acute CHF (congestive heart failure) (HJones 05/02/2018  . Renal insufficiency   . Acute on chronic diastolic CHF (congestive heart failure) (HOpdyke 03/23/2017  . Laryngeal stridor 03/23/2017  . Goiter diffuse 03/23/2017  . Dementia 03/20/2017  . SOB (shortness of breath)   . Hemoptysis 02/22/2017  . Stroke (HAuburn Lake Trails   . History of hemoptysis 02/16/2017  . Chronic bronchitis (HRiverbank 01/25/2017  . Acute bronchitis 12/27/2016  . CKD (chronic kidney disease), stage III (HAndrews 12/27/2016  . PNA (pneumonia) 12/21/2016  . Edema 11/02/2016  . Diastolic dysfunction 012/19/7588 . Osteoarthritis of both knees 2020-03-1117  . At risk for aspiration 10/03/2016  . Normocytic normochromic anemia 09/07/2016  . Acute on chronic respiratory failure (HHumboldt   . (HFpEF) heart failure with preserved ejection fraction (HSouth [place]   . Enteritis due to Clostridium difficile   . SBO (small bowel obstruction) (HTolono 08/10/2016  . Collapse of left lung   . S/P percutaneous endoscopic gastrostomy (PEG) tube placement (HNewton Hamilton   . Tracheostomy status (HKillen   . HCAP (healthcare-associated pneumonia) 07/16/2016  . Encephalopathy   . Palliative care encounter   . Pressure injury of skin 06/24/2016  . Acute respiratory failure (HElkin 06/21/2016  . Septic shock (HSkyline-Ganipa 06/20/2016  . Chronic respiratory failure with hypoxia (HMiddle Amana 11/29/2015  . Essential hypertension   . Lactic acidosis 08/02/2015  . History of CVA (cerebrovascular accident) 08/02/2015  . Chronic systolic HF (heart failure) (HLazy Mountain 08/02/2015  . Cognitive impairment 08/02/2015  . AKI (acute kidney injury) (HSanta Clara 02/08/2015  .  Abnormality of gait 02/08/2015  . Hyperthyroidism 02/08/2015  . Type 2 DM with CKD and hypertension ([place]) 02/08/2015  . Hyperglycemia 06/28/2009  . HYPERLIPIDEMIA-MIXED 06/28/2009  . CAD, NATIVE VESSEL 06/28/2009  . DYSPNEA  06/28/2009    Past Medical History:  Diagnosis Date  . Arthritis   . Asthma    09/07/16  . Diabetes mellitus, type 2 ([place])   . Diffuse goiter   . Hyperlipidemia   . Hypertension   . Hyperthyroidism   . Obesity hypoventilation syndrome ([place]) 06/20/2013  . Stroke ([place])   . Vascular dementia     Past Surgical History:  Procedure Laterality Date  . IR GENERIC HISTORICAL  07/12/2016   IR US GUIDE VASC ACCESS RIGHT 07/12/2016 [name], MD [facility]  . IR GENERIC HISTORICAL  07/12/2016   IR FLUORO GUIDE CV LINE RIGHT 07/12/2016 [name], MD [facility]  . IR GENERIC HISTORICAL  07/17/2016   IR GASTROSTOMY TUBE MOD SED 07/17/2016 [name], MD [facility]  . IR GENERIC HISTORICAL  08/23/2016   IR REMOVAL TUN CV CATH W/O FL 08/23/2016 [name], MD [facility]  . IR GENERIC HISTORICAL  11/09/2016   IR GASTROSTOMY TUBE REMOVAL 11/09/2016 [name], PA-C [facility]  . TRACHEOSTOMY TUBE PLACEMENT N/A 07/14/2016   Procedure: TRACHEOSTOMY, THYROID ISTHMUSECTOMY;  Surgeon: [name], MD;  Location: [facility] OR;  Service: ENT;  Laterality: N/A;  . VENTRAL HERNIA REPAIR      Family History  Problem Relation Age of Onset  . Diabetes Mother   . Cancer Neg Hx   . Heart disease Neg Hx   . Stroke Neg Hx     Social History:  reports that she quit smoking about 15 years ago. Her smoking use included cigarettes. She has a 7.50 pack-year smoking history. She has never used smokeless tobacco. She reports that she does not drink alcohol or use drugs.  Allergies:  Allergies  Allergen Reactions  . Penicillins Swelling    FACIAL SWELLING Has patient had a PCN reaction causing immediate rash, facial/tongue/throat swelling, SOB or lightheadedness with hypotension: Yes Has patient had a PCN reaction causing severe rash involving mucus membranes or skin necrosis: No Has patient had a PCN reaction that required hospitalization: No Has patient had a PCN reaction  occurring within the last 10 years: No If all of the above answers are "NO", then may proceed with Cephalosporin use.   . Lactose Intolerance (Gi) Diarrhea and Nausea And Vomiting    Medications: I have reviewed the patient's current medications.  Review of Systems: Pertinent items are noted in HPI.  Focused Physical Examination BP 132/79   Pulse 81   Temp 97.9 F (36.6 C) (Oral)   Resp 20   Ht '5\' 8"'  (1.727 m)   Wt 110 kg   SpO2 97%   BMI 36.87 kg/m  GENERAL: Well-developed, well-nourished female in no acute distress.  HEENT: Thyroid grossly enlarged BREASTS: Symmetric in size.  ABDOMEN: Soft, nontender, nondistended. No organomegaly. PELVIC: Normal external female genitalia. Patient unable to tolerate speculum exam, so this was deferred. Bimanual exam performed. Uterus grossly enlarged - about 15 week size with abnormal contour. No adnexal mass or tenderness.  EXTREMITIES: No cyanosis, clubbing, or edema, 2+ distal pulses.  Results for orders placed or performed during the hospital encounter of 05/02/18 (from the past 72 hour(s))  HIV antibody (Routine Testing)     Status: None   Collection Time: 05/02/18  9:07 AM  Result Value Ref Range  HIV Screen 4th Generation wRfx Non Reactive Non Reactive    Comment: (NOTE) Performed At: [facility] [place], [place] [id number] Rush Farmer MD YI:[id number]   CBC     Status: Abnormal   Collection Time: 05/02/18 10:04 AM  Result Value Ref Range   WBC 12.0 (H) 4.0 - 10.5 K/uL   RBC 4.45 3.87 - 5.11 MIL/uL   Hemoglobin 11.6 (L) 12.0 - 15.0 g/dL   HCT 37.8 36.0 - 46.0 %   MCV 84.9 78.0 - 100.0 fL   MCH 26.1 26.0 - 34.0 pg   MCHC 30.7 30.0 - 36.0 g/dL   RDW 14.6 11.5 - 15.5 %   Platelets 246 150 - 400 K/uL    Comment: Performed at [facility], [place] [address], [place], [place] [address]  Glucose, capillary     Status: Abnormal   Collection Time: 05/02/18 12:09 PM  Result Value Ref Range    Glucose-Capillary 53 (L) 70 - 99 mg/dL  Glucose, capillary     Status: None   Collection Time: 05/02/18 12:47 PM  Result Value Ref Range   Glucose-Capillary 75 70 - 99 mg/dL  Glucose, capillary     Status: Abnormal   Collection Time: 05/02/18  1:11 PM  Result Value Ref Range   Glucose-Capillary 102 (H) 70 - 99 mg/dL  Glucose, capillary     Status: Abnormal   Collection Time: 05/02/18  5:51 PM  Result Value Ref Range   Glucose-Capillary 125 (H) 70 - 99 mg/dL  Glucose, capillary     Status: Abnormal   Collection Time: 05/02/18  9:27 PM  Result Value Ref Range   Glucose-Capillary 164 (H) 70 - 99 mg/dL  Comprehensive metabolic panel     Status: Abnormal   Collection Time: 05/03/18  4:20 AM  Result Value Ref Range   Sodium 141 135 - 145 mmol/L   Potassium 4.8 3.5 - 5.1 mmol/L   Chloride 104 98 - 111 mmol/L   CO2 20 (L) 22 - 32 mmol/L   Glucose, Bld 125 (H) 70 - 99 mg/dL   BUN 19 8 - 23 mg/dL   Creatinine, Ser 1.40 (H) 0.44 - 1.00 mg/dL   Calcium 9.3 8.9 - 10.3 mg/dL   Total Protein 6.7 6.5 - 8.1 g/dL   Albumin 3.1 (L) 3.5 - 5.0 g/dL   AST 22 15 - 41 U/L   ALT <5 0 - 44 U/L   Alkaline Phosphatase 114 38 - 126 U/L   Total Bilirubin 0.7 0.3 - 1.2 mg/dL   GFR calc non Af Amer 38 (L) >60 mL/min   GFR calc Af Amer 45 (L) >60 mL/min    Comment: (NOTE) The eGFR has been calculated using the CKD EPI equation. This calculation has not been validated in all clinical situations. eGFR's persistently <60 mL/min signify possible Chronic Kidney Disease.    Anion gap 17 (H) 5 - 15    Comment: Performed at [facility], [place] [address], [place], [place] [address]  CBC     Status: Abnormal   Collection Time: 05/03/18  4:20 AM  Result Value Ref Range   WBC 8.5 4.0 - 10.5 K/uL   RBC 4.50 3.87 - 5.11 MIL/uL   Hemoglobin 11.7 (L) 12.0 - 15.0 g/dL   HCT 38.1 36.0 - 46.0 %   MCV 84.7 78.0 - 100.0 fL   MCH 26.0 26.0 - 34.0 pg   MCHC 30.7 30.0 - 36.0 g/dL  RDW 14.6 11.5 - 15.5 %    Platelets 190 150 - 400 K/uL    Comment: Performed at [facility], [place] [address] [place], [place] [address]  Protime-INR     Status: None   Collection Time: 05/03/18  4:20 AM  Result Value Ref Range   Prothrombin Time 14.1 11.4 - 15.2 seconds   INR 1.09     Comment: Performed at [place] [address] [place], [place] [address]  APTT     Status: None   Collection Time: 05/03/18  4:20 AM  Result Value Ref Range   aPTT 27 24 - 36 seconds    Comment: Performed at [place] [address], [place], [place] [address]  Glucose, capillary     Status: Abnormal   Collection Time: 05/03/18  6:19 AM  Result Value Ref Range   Glucose-Capillary 116 (H) 70 - 99 mg/dL  Glucose, capillary     Status: Abnormal   Collection Time: 05/03/18  7:49 AM  Result Value Ref Range   Glucose-Capillary 149 (H) 70 - 99 mg/dL  Glucose, capillary     Status: Abnormal   Collection Time: 05/03/18  1:39 PM  Result Value Ref Range   Glucose-Capillary 100 (H) 70 - 99 mg/dL  Glucose, capillary     Status: Abnormal   Collection Time: 05/03/18  4:59 PM  Result Value Ref Range   Glucose-Capillary 124 (H) 70 - 99 mg/dL  Glucose, capillary     Status: Abnormal   Collection Time: 05/03/18  9:04 PM  Result Value Ref Range   Glucose-Capillary 172 (H) 70 - 99 mg/dL  CBC     Status: Abnormal   Collection Time: 05/04/18  5:25 AM  Result Value Ref Range   WBC 8.1 4.0 - 10.5 K/uL   RBC 3.88 3.87 - 5.11 MIL/uL   Hemoglobin 10.0 (L) 12.0 - 15.0 g/dL   HCT 33.6 (L) 36.0 - 46.0 %   MCV 86.6 78.0 - 100.0 fL   MCH 25.8 (L) 26.0 - 34.0 pg   MCHC 29.8 (L) 30.0 - 36.0 g/dL   RDW 14.6 11.5 - 15.5 %   Platelets 205 150 - 400 K/uL    Comment: Performed at [facility], [place]. [address] [place], [place] [address]  Basic metabolic panel     Status: Abnormal   Collection Time: 05/04/18  5:25 AM  Result Value Ref Range   Sodium 142 135 - 145 mmol/L   Potassium 3.6 3.5 - 5.1 mmol/L    Comment: NO  VISIBLE HEMOLYSIS   Chloride 107 98 - 111 mmol/L   CO2 24 22 - 32 mmol/L   Glucose, Bld 122 (H) 70 - 99 mg/dL   BUN 15 8 - 23 mg/dL   Creatinine, Ser 1.30 (H) 0.44 - 1.00 mg/dL   Calcium 9.1 8.9 - 10.3 mg/dL   GFR calc non Af Amer 42 (L) >60 mL/min   GFR calc Af Amer 49 (L) >60 mL/min    Comment: (NOTE) The eGFR has been calculated using the CKD EPI equation. This calculation has not been validated in all clinical situations. eGFR's persistently <60 mL/min signify possible Chronic Kidney Disease.    Anion gap 11 5 - 15    Comment: Performed at [facility] [address] [place], [place] [address]  Glucose, capillary     Status: Abnormal   Collection Time: 05/04/18  7:39 AM  Result Value Ref  Range   Glucose-Capillary 118 (H) 70 - 99 mg/dL  TSH     Status: None   Collection Time: 05/04/18 12:10 PM  Result Value Ref Range   TSH 3.481 0.350 - 4.500 uIU/mL    Comment: Performed by a 3rd Generation assay with a functional sensitivity of <=0.01 uIU/mL. Performed at [facility], [place] [address], [place], [place] [address]   Troponin I (q 6hr x 3)     Status: None   Collection Time: 05/04/18 12:10 PM  Result Value Ref Range   Troponin I <0.03 <0.03 ng/mL    Comment: Performed at [facility] [address], [place], [place] [address]  Glucose, capillary     Status: Abnormal   Collection Time: 05/04/18 12:14 PM  Result Value Ref Range   Glucose-Capillary 208 (H) 70 - 99 mg/dL  Troponin I (q 6hr x 3)     Status: Abnormal   Collection Time: 05/04/18  5:09 PM  Result Value Ref Range   Troponin I 0.14 (HH) <0.03 ng/mL    Comment: CRITICAL RESULT CALLED TO, READ BACK BY AND VERIFIED WITHEdwin Dada RN @ [id number] ON 05/04/18 BY HTEMOCHE Performed at [facility], [place] [address], [place], [place] [address]   Glucose, capillary     Status: Abnormal   Collection Time: 05/04/18  5:18 PM  Result Value Ref Range   Glucose-Capillary 168 (H) 70 - 99 mg/dL  Glucose,  capillary     Status: Abnormal   Collection Time: 05/04/18  9:02 PM  Result Value Ref Range   Glucose-Capillary 124 (H) 70 - 99 mg/dL  Troponin I (q 6hr x 3)     Status: None   Collection Time: 05/05/18 12:04 AM  Result Value Ref Range   Troponin I <0.03 <0.03 ng/mL    Comment: Performed at [facility], [place] [address], [place], [place] [address]  CBC with Differential/Platelet     Status: Abnormal   Collection Time: 05/05/18 12:04 AM  Result Value Ref Range   WBC 10.5 4.0 - 10.5 K/uL   RBC 3.74 (L) 3.87 - 5.11 MIL/uL   Hemoglobin 9.8 (L) 12.0 - 15.0 g/dL   HCT 32.3 (L) 36.0 - 46.0 %   MCV 86.4 78.0 - 100.0 fL   MCH 26.2 26.0 - 34.0 pg   MCHC 30.3 30.0 - 36.0 g/dL   RDW 14.5 11.5 - 15.5 %   Platelets 202 150 - 400 K/uL   Neutrophils Relative % 69 %   Neutro Abs 7.2 1.7 - 7.7 K/uL   Lymphocytes Relative 19 %   Lymphs Abs 2.0 0.7 - 4.0 K/uL   Monocytes Relative 8 %   Monocytes Absolute 0.9 0.1 - 1.0 K/uL   Eosinophils Relative 3 %   Eosinophils Absolute 0.3 0.0 - 0.7 K/uL   Basophils Relative 0 %   Basophils Absolute 0.0 0.0 - 0.1 K/uL   Immature Granulocytes 1 %   Abs Immature Granulocytes 0.1 0.0 - 0.1 K/uL    Comment: Performed at [facility] [address], [place], [place] [address]  Basic metabolic panel     Status: Abnormal   Collection Time: 05/05/18 12:04 AM  Result Value Ref Range   Sodium 134 (L) 135 - 145 mmol/L   Potassium 4.8 3.5 - 5.1 mmol/L    Comment: DELTA CHECK NOTED SLIGHT HEMOLYSIS    Chloride 103 98 - 111 mmol/L   CO2 21 (L) 22 -  32 mmol/L   Glucose, Bld 162 (H) 70 - 99 mg/dL   BUN 18 8 - 23 mg/dL   Creatinine, Ser 1.82 (H) 0.44 - 1.00 mg/dL   Calcium 8.6 (L) 8.9 - 10.3 mg/dL   GFR calc non Af Amer 28 (L) >60 mL/min   GFR calc Af Amer 32 (L) >60 mL/min    Comment: (NOTE) The eGFR has been calculated using the CKD EPI equation. This calculation has not been validated in all clinical situations. eGFR's persistently <60 mL/min  signify possible Chronic Kidney Disease.    Anion gap 10 5 - 15    Comment: Performed at [facility] [address] [place], [place] [address]  Glucose, capillary     Status: Abnormal   Collection Time: 05/05/18  7:42 AM  Result Value Ref Range   Glucose-Capillary 153 (H) 70 - 99 mg/dL     US Pelvic Complete With Transvaginal  Result Date: 05/03/2018 CLINICAL DATA:  Study is limited due to patient body habitus and the patient's inability to tolerate the transvaginal probe. EXAM: TRANSABDOMINAL AND TRANSVAGINAL ULTRASOUND OF PELVIS TECHNIQUE: Both transabdominal and transvaginal ultrasound examinations of the pelvis were performed. Transabdominal technique was performed for global imaging of the pelvis including uterus, ovaries, adnexal regions, and pelvic cul-de-sac. It was necessary to proceed with endovaginal exam following the transabdominal exam to visualize the endometrium and ovaries. COMPARISON:  None FINDINGS: Uterus Measurements: 12.3 x 8.0 x 8.4 cm. There appear to be 2 masses in the uterus measuring 3.5 x 2.3 x 3.2 cm and 4.3 x 3.9 x 4.3 cm. Endometrium Thickness: 21.1 mm.  There is fluid in the endometrial canal. Right ovary Not visualized. Left ovary Not visualized. Other findings No abnormal free fluid. IMPRESSION: 1. The study is significantly limited due to the patient's body habitus and the patient's inability to tolerate the vaginal probe. 2. The endometrium appears to be significantly thickened measuring up to 21.1 mm. There is fluid in the canal. In the setting of post-menopausal bleeding, endometrial sampling is indicated to exclude carcinoma. If results are benign, sonohysterogram should be considered for focal lesion work-up. (Ref: Radiological Reasoning: Algorithmic Workup of Abnormal Vaginal Bleeding with Endovaginal Sonography and Sonohysterography. AJR 2008; [id number]:G86-28) 3. Two apparent fibroids. Electronically Signed   By: Dorise Bullion III M.D   On: 05/03/2018  12:42     Truett Mainland, DO 05/05/2018, 8:41 AM Attending Physician Faculty Practice, [facility]

## 2018-05-05 NOTE — Progress Notes (Signed)
Cardiology asked to perform TEE tomorrow. I do not have access to schedule on weekend, so I have placed order for pt to be NPO after midnight - have sent message to our coordinator to call in AM to schedule. Pt is noted to be on insulin - wrote care order for nurse to call primary team to have them decide if dose needs adjusting since she will be NPO after midnight. Morning TEE schedule is booked so procedure would not likely be until PM. Melina Copa PA-C

## 2018-05-06 ENCOUNTER — Inpatient Hospital Stay (HOSPITAL_COMMUNITY): Payer: Medicare Other | Admitting: Anesthesiology

## 2018-05-06 ENCOUNTER — Encounter (HOSPITAL_COMMUNITY): Payer: Self-pay

## 2018-05-06 ENCOUNTER — Inpatient Hospital Stay (HOSPITAL_COMMUNITY): Payer: Medicare Other

## 2018-05-06 ENCOUNTER — Encounter (HOSPITAL_COMMUNITY)
Admission: EM | Disposition: A | Discharge status: Home Health | Payer: Self-pay | Source: Home / Self Care | Attending: Internal Medicine

## 2018-05-06 DIAGNOSIS — R9389 Abnormal findings on diagnostic imaging of other specified body structures: Secondary | ICD-10-CM

## 2018-05-06 DIAGNOSIS — N950 Postmenopausal bleeding: Secondary | ICD-10-CM

## 2018-05-06 DIAGNOSIS — I359 Nonrheumatic aortic valve disorder, unspecified: Secondary | ICD-10-CM

## 2018-05-06 HISTORY — PX: TEE WITHOUT CARDIOVERSION: SHX5443

## 2018-05-06 LAB — BASIC METABOLIC PANEL
ANION GAP: 9 (ref 5–15)
BUN: 20 mg/dL (ref 8–23)
CHLORIDE: 105 mmol/L (ref 98–111)
CO2: 24 mmol/L (ref 22–32)
Calcium: 9.1 mg/dL (ref 8.9–10.3)
Creatinine, Ser: 1.64 mg/dL — ABNORMAL HIGH (ref 0.44–1.00)
GFR calc non Af Amer: 32 mL/min — ABNORMAL LOW (ref >60)
GFR, EST AFRICAN AMERICAN: 37 mL/min — AB (ref >60)
Glucose, Bld: 134 mg/dL — ABNORMAL HIGH (ref 70–99)
POTASSIUM: 4 mmol/L (ref 3.5–5.1)
SODIUM: 138 mmol/L (ref 135–145)

## 2018-05-06 LAB — CBC
HEMATOCRIT: 34.9 % — AB (ref 36.0–46.0)
HEMOGLOBIN: 10.8 g/dL — AB (ref 12.0–15.0)
MCH: 26 pg (ref 26.0–34.0)
MCHC: 30.9 g/dL (ref 30.0–36.0)
MCV: 84.1 fL (ref 78.0–100.0)
Platelets: 220 10*3/uL (ref 150–400)
RBC: 4.15 MIL/uL (ref 3.87–5.11)
RDW: 14.4 % (ref 11.5–15.5)
WBC: 10.8 10*3/uL — AB (ref 4.0–10.5)

## 2018-05-06 LAB — GLUCOSE, CAPILLARY
GLUCOSE-CAPILLARY: 129 mg/dL — AB (ref 70–99)
GLUCOSE-CAPILLARY: 99 mg/dL (ref 70–99)
GLUCOSE-CAPILLARY: 127 mg/dL — AB (ref 70–99)
Glucose-Capillary: 158 mg/dL — ABNORMAL HIGH (ref 70–99)

## 2018-05-06 SURGERY — ECHOCARDIOGRAM, TRANSESOPHAGEAL
Anesthesia: Monitor Anesthesia Care

## 2018-05-06 MED ORDER — LACTATED RINGERS IV SOLN
INTRAVENOUS | Status: DC
  Administered 2018-05-06: 14:00:00 via INTRAVENOUS
  Administered 2018-05-06: 1000 mL via INTRAVENOUS

## 2018-05-06 MED ORDER — PROPOFOL 500 MG/50ML IV EMUL
INTRAVENOUS | Status: DC | PRN
  Administered 2018-05-06: 75 ug/kg/min via INTRAVENOUS

## 2018-05-06 MED ORDER — PROPOFOL 10 MG/ML IV BOLUS
INTRAVENOUS | Status: DC | PRN
  Administered 2018-05-06: 30 mg via INTRAVENOUS

## 2018-05-06 MED ORDER — SODIUM CHLORIDE 0.9 % IV SOLN: INTRAVENOUS | Status: DC

## 2018-05-06 MED ORDER — PHENYLEPHRINE 40 MCG/ML (10ML) SYRINGE FOR IV PUSH (FOR BLOOD PRESSURE SUPPORT)
PREFILLED_SYRINGE | INTRAVENOUS | Status: DC | PRN
  Administered 2018-05-06: 120 ug via INTRAVENOUS

## 2018-05-06 NOTE — H&P (View-Only) (Signed)
    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  After careful review of history and examination, the risks and benefits of transesophageal echocardiogram have been explained including risks of esophageal damage, perforation (1:10,000 risk), bleeding, pharyngeal hematoma as well as other potential complications associated with conscious sedation including aspiration, arrhythmia, respiratory failure and death. Alternatives to treatment were discussed, questions were answered.   Due to underlying dementia, patient could not make medical decision by herself. I have spoken to her daughter who makes the medical decision. Benefit and risk of the procedure has been explained to the daughter, she displayed clear understanding and agree to consent for the procedure. Note, patient prefers her mother to make her medical decision, however according to her daughter, her mother has been deceased for a long time.   67 yo AA female who came in for eval of GI bleed. Echo showed normal EF, possible vegetation on aortic valve. BP stable, anemia also stable. Normal platelet level. No obvious contraindication. Discussed the procedure with the patient's daughter. Patient's daughter is currently in [place], will try to get her after the case is over. Will ask the nurse to inform the daughter once she wakes up from sedation.  Almyra Deforest, PA-C 05/06/2018 10:28 AM

## 2018-05-06 NOTE — CV Procedure (Signed)
     Transesophageal Echocardiogram Note  Ambri Miltner [id number] 03-16-1951  Procedure: Transesophageal Echocardiogram Indications: aortic valve mass  Procedure Details Consent: Obtained Time Out: Verified patient identification, verified procedure, site/side was marked, verified correct patient position, special equipment/implants available, Radiology Safety Procedures followed,  medications/allergies/relevent history reviewed, required imaging and test results available.  Performed  Medications: During this procedure the patient is administered iv Propofol by anesthesia staff.  The patient's heart rate, blood pressure, and oxygen saturation are monitored continuously during the procedure. The period of conscious sedation is 30 minutes, of which I was present face-to-face 100% of this time.  - Aortic valve is moderately thickened and calcified. Left coronary   leaflet is severely calcified with a mass like calcification   measuring 14 x 7 mm with a very small mobile portion. This   doesn&'t look like vegetation but rather like a valve   degeneration. Transaortic velocities are minimally elevated. This   is not significantly changed from the prior study on 06/27/2016.  Complications: No apparent complications Patient did tolerate procedure well.  [name], MD, [facility] 05/06/2018, 2:15 PM

## 2018-05-06 NOTE — NC FL2 (Signed)
[place] LEVEL OF CARE SCREENING TOOL     IDENTIFICATION  Patient Name: Angelica Beck Birthdate: 1951/01/28 Sex: female Admission Date (Current Location): 05/02/2018  [place] and [place] Number:  Kathleen Argue   [id number] [place] and Address:  [facility] [place], [place] [address] [place], [place] [address]      Provider Number: [id number]  Attending Physician Name and Address:  Eugenie Filler, MD  Relative Name and Phone Number:  [name], daughter, [phone]    Current Level of Care: Hospital Recommended Level of Care: [place] Prior Approval Number:    Date Approved/Denied:   PASRR Number: [id number]  Discharge Plan: SNF    Current Diagnoses: Patient Active Problem List   Diagnosis Date Noted  . Postmenopausal bleeding 05/05/2018  . Abnormal echocardiogram   . Endometrial thickening on ultrasound 05/04/2018  . Bradycardia 05/04/2018  . Vaginal bleeding 05/03/2018  . Rectal bleeding 05/02/2018  . GI bleed 05/02/2018  . Dementia due to Parkinson's disease without behavioral disturbance ([place]) 05/02/2018  . Acute CHF (congestive heart failure) ([place]) 05/02/2018  . Renal insufficiency   . Acute on chronic diastolic CHF (congestive heart failure) ([place]) 03/23/2017  . Laryngeal stridor 03/23/2017  . Goiter diffuse 03/23/2017  . Dementia 03/20/2017  . SOB (shortness of breath)   . Hemoptysis 02/22/2017  . Stroke ([place])   . History of hemoptysis 02/16/2017  . Chronic bronchitis ([place]) 01/25/2017  . Acute bronchitis 12/27/2016  . CKD (chronic kidney disease), stage III ([place]) 12/27/2016  . PNA (pneumonia) 12/21/2016  . Edema 11/02/2016  . Diastolic dysfunction 38/25/0539  . Osteoarthritis of both knees 2020-09-1916  . At risk for aspiration 10/03/2016  . Normocytic normochromic anemia 09/07/2016  . Acute on chronic respiratory failure ([place])   . (HFpEF) heart failure with preserved ejection fraction ([place])   .  Enteritis due to Clostridium difficile   . SBO (small bowel obstruction) ([place]) 08/10/2016  . Collapse of left lung   . S/P percutaneous endoscopic gastrostomy (PEG) tube placement ([place])   . Tracheostomy status ([place])   . HCAP (healthcare-associated pneumonia) 07/16/2016  . Encephalopathy   . Palliative care encounter   . Pressure injury of skin 06/24/2016  . Acute respiratory failure ([place]) 06/21/2016  . Septic shock ([place]) 06/20/2016  . Chronic respiratory failure with hypoxia ([place]) 11/29/2015  . Essential hypertension   . Lactic acidosis 08/02/2015  . History of CVA (cerebrovascular accident) 08/02/2015  . Chronic systolic HF (heart failure) ([place]) 08/02/2015  . Cognitive impairment 08/02/2015  . AKI (acute kidney injury) ([place]) 02/08/2015  . Abnormality of gait 02/08/2015  . Hyperthyroidism 02/08/2015  . Type 2 DM with CKD and hypertension ([place]) 02/08/2015  . Hyperglycemia 06/28/2009  . HYPERLIPIDEMIA-MIXED 06/28/2009  . CAD, NATIVE VESSEL 06/28/2009  . 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 )                       Personal Care Assistance Level of Assistance  Bathing, Feeding, Dressing Bathing Assistance: Maximum assistance Feeding assistance: Limited assistance Dressing Assistance: Maximum assistance     Functional Limitations Info  Sight,  Hearing, Speech Sight Info: Adequate Hearing Info: Adequate Speech Info: Adequate    SPECIAL CARE FACTORS FREQUENCY  PT (By licensed PT), OT (By licensed OT)     PT Frequency: 5x week OT Frequency: 5x week            Contractures Contractures Info: Not present     Additional Factors Info  Code Status, Allergies Code Status Info: Full Code Allergies Info: PENICILLINS, LACTOSE INTOLERANCE (GI)            Current Medications (05/06/2018):  This is the current hospital active medication list Current Facility-Administered Medications  Medication Dose Route Frequency Provider Last Rate Last Dose  . bisacodyl (DULCOLAX) suppository 10 mg  10 mg Rectal Beverly Gust Eugenie Filler, MD   10 mg at 05/04/18 1026  . budesonide (PULMICORT) nebulizer solution 0.5 mg  0.5 mg Nebulization BID Georgette Shell, MD   0.5 mg at 05/06/18 0716  . cholecalciferol (VITAMIN D) tablet 1,000 Units  1,000 Units Oral Daily Eugenie Filler, MD   1,000 Units at 05/06/18 3154  . furosemide (LASIX) tablet 20 mg  20 mg Oral Daily Eugenie Filler, MD   20 mg at 05/06/18 0086  . hydrALAZINE (APRESOLINE) injection 5 mg  5 mg Intravenous Q6H PRN Georgette Shell, MD      . hydrALAZINE (APRESOLINE) tablet 25 mg  25 mg Oral Q8H Eugenie Filler, MD   25 mg at 05/06/18 0100  . insulin aspart (novoLOG) injection 0-9 Units  0-9 Units Subcutaneous TID WC Georgette Shell, MD   1 Units at 05/06/18 [phone]  . insulin detemir (LEVEMIR) injection 5 Units  5 Units Subcutaneous QHS Eugenie Filler, MD   5 Units at 05/05/18 2316  . megestrol (MEGACE) tablet 40 mg  40 mg Oral BID Truett Mainland, DO   40 mg at 05/06/18 5093  . memantine (NAMENDA) tablet 10 mg  10 mg Oral BID Eugenie Filler, MD   10 mg at 05/05/18 2313  . methimazole (TAPAZOLE) tablet 10 mg  10 mg Oral Daily Georgette Shell, MD   10 mg at 05/06/18 2671  . metoprolol tartrate (LOPRESSOR) tablet 12.5 mg  12.5 mg Oral BID Eugenie Filler, MD   12.5 mg at 05/06/18 2458  . pantoprazole (PROTONIX) EC tablet 40 mg  40 mg Oral Q0600 Eugenie Filler, MD   40 mg at 05/06/18 0514  . polyethylene glycol (MIRALAX / GLYCOLAX) packet 17 g  17 g Oral Daily Eugenie Filler, MD   17 g at 05/06/18 [phone]  .  senna-docusate (Senokot-S) tablet 1 tablet  1 tablet Oral BID Eugenie Filler, MD   1 tablet at 05/06/18 [phone]  . sorbitol, milk of mag, mineral oil, glycerin (SMOG) enema  960 mL Rectal Once Willia Craze, NP         Discharge Medications: Please see discharge summary for a list of discharge medications.  Relevant Imaging Results:  Relevant Lab Results:   Additional Information SS# [id number]   [place]mer [place], [place]

## 2018-05-06 NOTE — Progress Notes (Signed)
    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  After careful review of history and examination, the risks and benefits of transesophageal echocardiogram have been explained including risks of esophageal damage, perforation (1:10,000 risk), bleeding, pharyngeal hematoma as well as other potential complications associated with conscious sedation including aspiration, arrhythmia, respiratory failure and death. Alternatives to treatment were discussed, questions were answered.   Due to underlying dementia, patient could not make medical decision by herself. I have spoken to her daughter who makes the medical decision. Benefit and risk of the procedure has been explained to the daughter, she displayed clear understanding and agree to consent for the procedure. Note, patient prefers her mother to make her medical decision, however according to her daughter, her mother has been deceased for a long time.   67 yo AA female who came in for eval of GI bleed. Echo showed normal EF, possible vegetation on aortic valve. BP stable, anemia also stable. Normal platelet level. No obvious contraindication. Discussed the procedure with the patient's daughter. Patient's daughter is currently in [place], will try to get her after the case is over. Will ask the nurse to inform the daughter once she wakes up from sedation.  Almyra Deforest, PA-C 05/06/2018 10:28 AM

## 2018-05-06 NOTE — Transfer of Care (Signed)
Immediate Anesthesia Transfer of Care Note  Patient: Angelica Beck  Procedure(s) Performed: TRANSESOPHAGEAL ECHOCARDIOGRAM (TEE) (N/A )  Patient Location: Endoscopy Unit  Anesthesia Type:MAC  Level of Consciousness: drowsy  Airway & Oxygen Therapy: Patient Spontanous Breathing and Patient connected to nasal cannula oxygen  Post-op Assessment: Report given to RN and Post -op Vital signs reviewed and stable  Post vital signs: Reviewed and stable  Last Vitals:  Vitals Value Taken Time  BP    Temp    Pulse    Resp    SpO2      Last Pain:  Vitals:   05/06/18 1234  TempSrc: Oral  PainSc: 0-No pain      Patients Stated Pain Goal: 0 (39/76/73 4193)  Complications: No apparent anesthesia complications

## 2018-05-06 NOTE — Progress Notes (Signed)
  Echocardiogram Echocardiogram Transesophageal has been performed.  Angelica Beck 05/06/2018, 2:11 PM

## 2018-05-06 NOTE — Interval H&P Note (Signed)
History and Physical Interval Note:  05/06/2018 11:11 AM  Angelica Beck  has presented today for surgery, with the diagnosis of aortic valve mass  The various methods of treatment have been discussed with the patient and family. After consideration of risks, benefits and other options for treatment, the patient has consented to  Procedure(s): TRANSESOPHAGEAL ECHOCARDIOGRAM (TEE) (N/A) as a surgical intervention .  The patient's history has been reviewed, patient examined, no change in status, stable for surgery.  I have reviewed the patient's chart and labs.  Questions were answered to the patient's satisfaction.     [name]

## 2018-05-06 NOTE — Progress Notes (Signed)
PROGRESS NOTE    Angelica Beck  YDX:[id number] DOB: 25-Mar-1951 DOA: 05/02/2018 PCP: Clovia Cuff, MD    Brief Narrative: Angelica Beck is a 67 y.o. female with medical history significant of diabetes, hypertension, hyperthyroidism, history of stroke,chf,ckd stage III vascular dementia admitted with complaints of passing clots via rectum 3-4 times since yesterday.  Patient lives at home with her daughter who takes care of her 24 hours.  No history of nausea vomiting hematemesis or hematuria.  Daughter reported that she is never had anything like this in the past no known history of hemorrhoids diverticulitis .  Patient has had a history of bowel obstruction in the past. she denies any fever, chills, cough, abdominal pain.denies any chest pain shortness of breath. Patient has had a history of long hospital stay complicated by small bowel obstruction trach and PEG in the past.  ED Course: Her blood pressure was 148 111 pulse was 57 respirations 17 sats 95% on room air.  Were significant for hemoglobin of 11.4 which is better than before though she appears somewhat dry normal platelets white count 9.8 sodium 140 potassium 4.1 BUN 22 creatinine 1.64 which is her baseline.   Assessment & Plan:   Principal Problem:   Postmenopausal bleeding Active Problems:   Bradycardia   Hyperthyroidism   Type 2 DM with CKD and hypertension (HCC)   History of CVA (cerebrovascular accident)   Chronic systolic HF (heart failure) (HCC)   Cognitive impairment   Essential hypertension   CKD (chronic kidney disease), stage III (HCC)   Rectal bleeding   GI bleed   Renal insufficiency   Dementia due to Parkinson's disease without behavioral disturbance (HCC)   Vaginal bleeding   Endometrial thickening on ultrasound   Abnormal echocardiogram  #1 rectal bleeding/fecal impaction Patient had presented with rectal bleeding on presentation and concern for fecal impaction.  Patient was seen by  gastroenterology and smog enema ordered.  Patient with bowel movement.  Patient seen by GI PA the morning of 05/03/2018, who noted resolution of fecal impaction as rectal vault with thin light brown liquid residuals with no overt bleeding.  Advanced diet to a soft diet which patient is tolerating.  GI following and appreciate input and recommendations.  2.  Postmenopausal bleeding. Patient noted the evening of 05/03/2018 to have clots coming from her vagina.  Pelvic ultrasound done was concerning for endometrial thickening as well as masses noted on the uterus.  Patient started on Megace 80 mg twice daily which has been decreased to 40 mg twice daily per GYN recommendations.  Outpatient follow-up with GYN for probable endometrial sampling and further evaluation.   3.  Constipation/impaction Resolved.  Patient received a smog enema.  Placed on MiraLAX daily as well as Senokot as twice daily and Dulcolax suppository every other day per GI recommendations.  4.  Hyperthyroidism/goiter Decreased Lopressor to 12.5 mg twice daily due to bradycardia and sinus pause of 3.5 noted the morning of 05/04/2018.  Continue methimazole.  Outpatient follow-up.    5.  Dementia due to Parkinson's disease Stable.  Continue Namenda.  6.  Diabetes mellitus  Last hemoglobin A1c was 7.4 on 03/10/2017.  CBG this morning was 129.  Continue sliding scale insulin.  Continue Levemir 10 units daily.  7.  Hypertension Stable.  Patient placed on Lopressor however patient noted to be severely bradycardic the morning of 05/04/2018, with 3.5-second pause and as such Lopressor dose decreased.  Patient started on hydralazine for blood pressure control which we will continue  for now.    8.  History of diastolic CHF Diuretics initially held early on in the hospitalization.  Currently euvolemic.  Diuretics were resumed on 05/05/2018.  Follow renal function.    9.  Bradycardia with sinus pause Patient noted to have a bradycardia with heart  rates in the 30s per RN on 05/04/2018.  Patient asymptomatic.  EKG done showing a first-degree AV block.  TSH within normal limits at 3.481.  2 sets of cardiac enzymes were negative and one at 0.14 likely not accurate.  2D echo was done with a normal EF, diastolic dysfunction, nodule noted on aortic valve that was mobile and per cardiology concerning for possible endocarditis.  Blood cultures ordered and are pending.  Status post TEE.  Bradycardia has improved on decreased dose of Lopressor 12.5 mg twice daily.  Follow.    10.  Abnormal 2D echo 2D echo with a EF of 60 to 48%, grade 1 diastolic dysfunction, nodule noted on aortic valve with some mobility.  Concern for endocarditis.  Patient however afebrile.  Blood cultures have been ordered and are pending.  Patient underwent TEE earlier on today by Dr. Meda Coffee which noted a moderately thickened calcified aortic valve.  Left coronary leaflet severely calcified with a masslike calcification measuring 14 x 7 mm with a very small mobile portion.  This does not look like vegetation but rather like a valve degeneration.  Transaortic velocities minimally elevated.  Monitor blood cultures for another 24 hours and if still negative with no growth to date patient should be stable for outpatient follow-up with PCP.    11.  Chronic kidney disease stage III Stable.  Follow.   DVT prophylaxis: SCDs Code Status: Full Family Communication: No family at bedside.  Updated daughter, [name] via telephone. Disposition Plan: Home with home health tomorrow 05/07/2018.   Consultants:   Gastroenterology: Dr. Lyndel Safe 05/02/2018  GYN: Dr. Nehemiah Settle 05/05/2018  Procedures:   2D echo 05/05/2018  Transesophageal echocardiogram 05/06/2018 per Dr. Meda Coffee  Antimicrobials:   None   Subjective: Patient just returned from TEE.  Denies any chest pain or shortness of breath.  No syncopal episodes.  No dizziness, no lightheadedness.  No bleeding noted.    Objective: Vitals:    05/06/18 0438 05/06/18 0717 05/06/18 1234 05/06/18 1415  BP: (!) 143/85  (!) 144/84 124/73  Pulse: 89  85   Resp: 16  15 11   Temp: 98.4 F (36.9 C)  98.8 F (37.1 C) 98.8 F (37.1 C)  TempSrc: Oral  Oral Oral  SpO2: 100% 99% 100% 100%  Weight:   110 kg   Height:   5\' 8"  (1.727 m)     Intake/Output Summary (Last 24 hours) at 05/06/2018 1443 Last data filed at 05/06/2018 1410 Gross per 24 hour  Intake 662 ml  Output 950 ml  Net -288 ml   Filed Weights   05/02/18 0252 05/06/18 1234  Weight: 110 kg 110 kg    Examination:  General exam: NAD.  Large goiter Respiratory system: CTAB.  No wheezes, no crackles, no rhonchi.  Normal respiratory effort.  Cardiovascular system: RRR no murmurs rubs or gallops.  No JVD.  No lower extremity edema.    Gastrointestinal system: Abdomen is nontender, nondistended, soft, positive bowel sounds.  No rebound.  No guarding.   Central nervous system: Alert. No focal neurological deficits. Extremities: Symmetric 5 x 5 power. Skin: No rashes, lesions or ulcers Psychiatry: Judgement and insight appear normal. Mood & affect appropriate.  Data Reviewed: I have personally reviewed following labs and imaging studies  CBC: Recent Labs  Lab 05/02/18 0300 05/02/18 1004 05/03/18 0420 05/04/18 0525 05/05/18 0004 05/06/18 0556  WBC 9.8 12.0* 8.5 8.1 10.5 10.8*  NEUTROABS 6.7  --   --   --  7.2  --   HGB 11.4* 11.6* 11.7* 10.0* 9.8* 10.8*  HCT 38.5 37.8 38.1 33.6* 32.3* 34.9*  MCV 87.5 84.9 84.7 86.6 86.4 84.1  PLT 255 246 190 205 202 503   Basic Metabolic Panel: Recent Labs  Lab 05/02/18 0300 05/03/18 0420 05/04/18 0525 05/05/18 0004 05/06/18 0556  NA 140 141 142 134* 138  K 4.1 4.8 3.6 4.8 4.0  CL 100 104 107 103 105  CO2 28 20* 24 21* 24  GLUCOSE 129* 125* 122* 162* 134*  BUN 22 19 15 18 20   CREATININE 1.64* 1.40* 1.30* 1.82* 1.64*  CALCIUM 9.9 9.3 9.1 8.6* 9.1   GFR: Estimated Creatinine Clearance: 44.4 mL/min (A) (by C-G  formula based on SCr of 1.64 mg/dL (H)). Liver Function Tests: Recent Labs  Lab 05/02/18 0300 05/03/18 0420  AST 20 22  ALT 11 <5  ALKPHOS 123 114  BILITOT 0.7 0.7  PROT 7.3 6.7  ALBUMIN 3.1* 3.1*   No results for input(s): LIPASE, AMYLASE in the last 168 hours. No results for input(s): AMMONIA in the last 168 hours. Coagulation Profile: Recent Labs  Lab 05/03/18 0420  INR 1.09   Cardiac Enzymes: Recent Labs  Lab 05/04/18 1210 05/04/18 1709 05/05/18 0004  TROPONINI <0.03 0.14* <0.03   BNP (last 3 results) No results for input(s): PROBNP in the last 8760 hours. HbA1C: No results for input(s): HGBA1C in the last 72 hours. CBG: Recent Labs  Lab 05/05/18 1213 05/05/18 1705 05/05/18 2123 05/06/18 0734 05/06/18 1159  GLUCAP 132* 177* 176* 129* 99   Lipid Profile: No results for input(s): CHOL, HDL, LDLCALC, TRIG, CHOLHDL, LDLDIRECT in the last 72 hours. Thyroid Function Tests: Recent Labs    05/04/18 1210  TSH 3.481   Anemia Panel: No results for input(s): VITAMINB12, FOLATE, FERRITIN, TIBC, IRON, RETICCTPCT in the last 72 hours. Sepsis Labs: No results for input(s): PROCALCITON, LATICACIDVEN in the last 168 hours.  Recent Results (from the past 240 hour(s))  Urine culture     Status: Abnormal   Collection Time: 05/02/18  6:24 AM  Result Value Ref Range Status   Specimen Description URINE, CATHETERIZED  Final   Special Requests   Final    NONE Performed at [facility], [address] [place],  [place] [address]    Culture MULTIPLE SPECIES PRESENT, SUGGEST RECOLLECTION (A)  Final   Report Status 05/03/2018 FINAL  Final  Culture, blood (Routine X 2) w Reflex to ID Panel     Status: None (Preliminary result)   Collection Time: 05/05/18  4:20 PM  Result Value Ref Range Status   Specimen Description BLOOD LEFT HAND  Final   Special Requests   Final    BOTTLES DRAWN AEROBIC ONLY Blood Culture results may not be optimal due to an inadequate volume of  blood received in culture bottles   Culture   Final    NO GROWTH < 24 HOURS Performed at [facility], [place] [address], [place], [place] [address]    Report Status PENDING  Incomplete  Culture, blood (Routine X 2) w Reflex to ID Panel     Status: None (Preliminary result)   Collection Time: 05/05/18  4:24  PM  Result Value Ref Range Status   Specimen Description BLOOD LEFT HAND  Final   Special Requests   Final    BOTTLES DRAWN AEROBIC ONLY Blood Culture results may not be optimal due to an inadequate volume of blood received in culture bottles   Culture   Final    NO GROWTH < 24 HOURS Performed at [facility] [address], [place], [place] [address]    Report Status PENDING  Incomplete         Radiology Studies: No results found.      Scheduled Meds: . bisacodyl  10 mg Rectal QODAY  . budesonide  0.5 mg Nebulization BID  . cholecalciferol  1,000 Units Oral Daily  . furosemide  20 mg Oral Daily  . hydrALAZINE  25 mg Oral Q8H  . insulin aspart  0-9 Units Subcutaneous TID WC  . insulin detemir  5 Units Subcutaneous QHS  . megestrol  40 mg Oral BID  . memantine  10 mg Oral BID  . methimazole  10 mg Oral Daily  . metoprolol tartrate  12.5 mg Oral BID  . pantoprazole  40 mg Oral Q0600  . polyethylene glycol  17 g Oral Daily  . senna-docusate  1 tablet Oral BID  . sorbitol, milk of mag, mineral oil, glycerin (SMOG) enema  960 mL Rectal Once   Continuous Infusions:   LOS: 4 days    Time spent: 40 minutes    Irine Seal, MD [facility] Pager [phone]  If 7PM-7AM, please contact night-coverage [email or web address] Password [facility] 05/06/2018, 2:43 PM

## 2018-05-06 NOTE — Anesthesia Preprocedure Evaluation (Signed)
Anesthesia Evaluation  Patient identified by MRN, date of birth, ID band Patient awake    Reviewed: Allergy & Precautions, NPO status , Patient's Chart, lab work & pertinent test results  History of Anesthesia Complications Negative for: history of anesthetic complications  Airway Mallampati: Intubated       Dental  (+) Dental Advisory Given   Pulmonary former smoker,    + rhonchi        Cardiovascular hypertension, Pt. on home beta blockers and Pt. on medications + CAD   Rhythm:Regular     Neuro/Psych PSYCHIATRIC DISORDERS CVA    GI/Hepatic negative GI ROS, Neg liver ROS,   Endo/Other  diabetes, Type 2Morbid obesity  Renal/GU Dialysis and ESRFRenal disease     Musculoskeletal   Abdominal   Peds  Hematology  (+) anemia ,   Anesthesia Other Findings   Reproductive/Obstetrics                             Anesthesia Physical  Anesthesia Plan  ASA: III  Anesthesia Plan: MAC   Post-op Pain Management:    Induction: Inhalational  PONV Risk Score and Plan: 2 and Treatment may vary due to age or medical condition  Airway Management Planned: Nasal Cannula, Natural Airway and Mask  Additional Equipment: None  Intra-op Plan:   Post-operative Plan: Post-operative intubation/ventilation  Informed Consent: I have reviewed the patients History and Physical, chart, labs and discussed the procedure including the risks, benefits and alternatives for the proposed anesthesia with the patient or authorized representative who has indicated his/her understanding and acceptance.   Dental advisory given  Plan Discussed with: CRNA, Surgeon and Anesthesiologist  Anesthesia Plan Comments:         Anesthesia Quick Evaluation

## 2018-05-06 NOTE — Plan of Care (Signed)
  Problem: Safety: Goal: Ability to remain free from injury will improve Outcome: Progressing   Problem: Clinical Measurements: Goal: Cardiovascular complication will be avoided Outcome: Progressing   Problem: Clinical Measurements: Goal: Ability to maintain clinical measurements within normal limits will improve Outcome: Progressing

## 2018-05-06 NOTE — Care Management Important Message (Signed)
Important Message  Patient Details  Name: Angelica Beck MRN: [id number] Date of Birth: [birth year]   Medicare Important Message Given:  Yes    Orbie Pyo 05/06/2018, 4:26 PM

## 2018-05-06 NOTE — Clinical Social Work Note (Signed)
Clinical Social Work Assessment  Patient Details  Name: Angelica Beck MRN: [id number] Date of Birth: Jul 12, 1951  Date of referral:  05/06/18               Reason for consult:  Facility Placement, Discharge Planning                Permission sought to share information with:  Case Manager, Family Supports Permission granted to share information::  No(pt only oriented to person and place)  Name::     [name]  Agency::  RN Case Manager Heather  Relationship::  daughter  Contact Information:  [phone]  Housing/Transportation Living arrangements for the past 2 months:  [place] of Information:  Adult Children Patient Interpreter Needed:  None Criminal Activity/Legal Involvement Pertinent to Current Situation/Hospitalization:  No - Comment as needed Significant Relationships:  Adult Children, Warehouse manager, Other(Comment)(CAP worker) Lives with:  Adult Children Do you feel safe going back to the place where you live?  Yes Need for family participation in patient care:  Yes (Comment)  Care giving concerns:  Pt requires almost total assistance with all IADLs and ADLs. Pt lives at home with adult daughter, only oriented to self.    Social Worker assessment / plan:  CSW spoke with pt daughter via telephone as pt is not fully oriented. Pt daughter states that her mother was in a SNF after discharge from [facility]. Pt spent multiple months there and pt daughter feels that her skin suffered breakdown while at SNF because "they did not have enough people to care for her." Pt daughter states that she feels comfortable having pt at home and providing care. Pt has 40 hours of CAP services a week and they come in daily to assist with pt care. Pt daughter would prefer pt discharge home with home health and the current services that she has. Pt daughter states that pt takes [facility] to appointments but she would feel more comfortable with pt being taken home via PTAR at  discharge. CSW given permission to relay this information to RN Case Architectural technologist.   Employment status:  Disabled (Comment on whether or not currently receiving Disability) Insurance information:  Medicaid [place], [place] PT Recommendations:  24 Hour Supervision, [place] / Referral to community resources:  [place]  Patient/Family's Response to care: Pt daughter amenable to speaking with CSW via telephone, states that she would prefer home with home health over SNF placement, she understands the recommendations but feels capable taking care of pt at home.   Patient/Family's Understanding of and Emotional Response to Diagnosis, Current Treatment, and Prognosis:  Pt daughter states understanding of diagnosis, current treatment and prognosis. Pt daughter is knowledgeable about pt care needs and states that she feels more comfortable having pt at home where she can see her needs and know they are being met. Pt did not have positive experience at SNF previously. Pt daughter has positive view of taking care of her mother and expresses realistic goals for her further progress.   Emotional Assessment Appearance:  Appears stated age Attitude/Demeanor/Rapport:  Unable to Assess Affect (typically observed):  Unable to Assess Orientation:  Fluctuating Orientation (Suspected and/or reported Sundowners), Oriented to Self, Oriented to Place Alcohol / Substance use:  Not Applicable Psych involvement (Current and /or in the community):  No (Comment)  Discharge Needs  Concerns to be addressed:  Care Coordination Readmission within the last 30 days:  No Current discharge risk:  Physical Impairment, Cognitively Impaired, Dependent with Mobility Barriers to Discharge:  Continued Medical Work up   [employer], [place] 05/06/2018, 12:04 PM

## 2018-05-06 NOTE — Anesthesia Postprocedure Evaluation (Signed)
Anesthesia Post Note  Patient: Angelica Beck  Procedure(s) Performed: TRANSESOPHAGEAL ECHOCARDIOGRAM (TEE) (N/A )     Patient location during evaluation: PACU Anesthesia Type: MAC Level of consciousness: awake and alert Pain management: pain level controlled Vital Signs Assessment: post-procedure vital signs reviewed and stable Respiratory status: spontaneous breathing, nonlabored ventilation, respiratory function stable and patient connected to nasal cannula oxygen Cardiovascular status: stable and blood pressure returned to baseline Postop Assessment: no apparent nausea or vomiting Anesthetic complications: no    Last Vitals:  Vitals:   05/06/18 1415 05/06/18 1510  BP: 124/73 (!) 150/92  Pulse:  81  Resp: 11 14  Temp: 37.1 C 36.5 C  SpO2: 100% 100%    Last Pain:  Vitals:   05/06/18 1510  TempSrc: Oral  PainSc:                  Merelyn Klump

## 2018-05-06 NOTE — Progress Notes (Signed)
Daughter called for consent for TEE.  Two witness verification utilized by RNs to obtain consent.

## 2018-05-06 NOTE — Progress Notes (Signed)
OT Cancellation    05/06/18 1200  OT Visit Information  Last OT Received On 05/06/18  Reason Eval/Treat Not Completed Patient at procedure or test/ unavailable (ENDO. Will return as schedule allows.)   Taison Celani MSOT, OTR/L Acute Rehab Pager: [phone] Office: [phone]

## 2018-05-06 NOTE — Care Management Note (Signed)
Case Management Note  Patient Details  Name: Angelica Beck MRN: [id number] Date of Birth: 06/09/1951  Subjective/Objective:                    Action/Plan:  Spoke to patient's daughter Angelica Beck [phone] via phone.   Discussed PT recommendation of SNF for short term rehab. [name] declined SNF for her mother. Angelica Beck would like to take her mother home with home health services. Her mother has CAPS 40 hours a week. Angelica Beck understands her mother may need IV Anabiotics at home and is willing to be taught how to administer same.   Usually her mother uses [facility] for transportation needs, however, at discharge Angelica Beck is requesting PTAR transport. Confirmed face sheet information. Expected Discharge Date:                  Expected Discharge Plan:  Angelica Beck  In-House Referral:     Discharge planning Services  CM Consult  Post Acute Care Choice:  Home Health Choice offered to:  Adult Children  DME Arranged:  N/A DME Agency:  NA  HH Arranged:  RN, PT, OT, CAPS Program, Social Work, Nurse's Aide [place] Agency:  [facility]  Status of Service:  In process, will continue to follow  If discussed at Long Length of Stay Meetings, dates discussed:    Additional Comments:  Angelica Favre, RN 05/06/2018, 1:51 PM

## 2018-05-07 ENCOUNTER — Encounter (HOSPITAL_COMMUNITY): Payer: Self-pay | Admitting: Cardiology

## 2018-05-07 ENCOUNTER — Inpatient Hospital Stay (HOSPITAL_COMMUNITY): Payer: Medicare Other

## 2018-05-07 DIAGNOSIS — R0603 Acute respiratory distress: Secondary | ICD-10-CM

## 2018-05-07 DIAGNOSIS — J9601 Acute respiratory failure with hypoxia: Secondary | ICD-10-CM | POA: Diagnosis not present

## 2018-05-07 DIAGNOSIS — Z8673 Personal history of transient ischemic attack (TIA), and cerebral infarction without residual deficits: Secondary | ICD-10-CM

## 2018-05-07 DIAGNOSIS — R0602 Shortness of breath: Secondary | ICD-10-CM

## 2018-05-07 LAB — GLUCOSE, CAPILLARY
GLUCOSE-CAPILLARY: 167 mg/dL — AB (ref 70–99)
GLUCOSE-CAPILLARY: 161 mg/dL — AB (ref 70–99)
Glucose-Capillary: 111 mg/dL — ABNORMAL HIGH (ref 70–99)
Glucose-Capillary: 167 mg/dL — ABNORMAL HIGH (ref 70–99)

## 2018-05-07 LAB — BLOOD GAS, ARTERIAL
ACID-BASE DEFICIT: 1.6 mmol/L (ref 0.0–2.0)
BICARBONATE: 23 mmol/L (ref 20.0–28.0)
Drawn by: 441351
FIO2: 0.4
O2 SAT: 94.5 %
PCO2 ART: 40.3 mmHg (ref 32.0–48.0)
PH ART: 7.371 (ref 7.350–7.450)
Patient temperature: 97.8
pO2, Arterial: 73.8 mmHg — ABNORMAL LOW (ref 83.0–108.0)

## 2018-05-07 LAB — BASIC METABOLIC PANEL
ANION GAP: 8 (ref 5–15)
BUN: 19 mg/dL (ref 8–23)
CO2: 26 mmol/L (ref 22–32)
Calcium: 9.1 mg/dL (ref 8.9–10.3)
Chloride: 104 mmol/L (ref 98–111)
Creatinine, Ser: 1.48 mg/dL — ABNORMAL HIGH (ref 0.44–1.00)
GFR calc Af Amer: 42 mL/min — ABNORMAL LOW (ref >60)
GFR calc non Af Amer: 36 mL/min — ABNORMAL LOW (ref >60)
GLUCOSE: 153 mg/dL — AB (ref 70–99)
POTASSIUM: 4 mmol/L (ref 3.5–5.1)
Sodium: 138 mmol/L (ref 135–145)

## 2018-05-07 LAB — CBC WITH DIFFERENTIAL/PLATELET
ABS IMMATURE GRANULOCYTES: 0.1 10*3/uL (ref 0.0–0.1)
Basophils Absolute: 0.1 10*3/uL (ref 0.0–0.1)
Basophils Relative: 0 %
EOS PCT: 1 %
Eosinophils Absolute: 0.2 10*3/uL (ref 0.0–0.7)
HEMATOCRIT: 33.5 % — AB (ref 36.0–46.0)
HEMOGLOBIN: 10.3 g/dL — AB (ref 12.0–15.0)
Immature Granulocytes: 0 %
LYMPHS ABS: 1.7 10*3/uL (ref 0.7–4.0)
LYMPHS PCT: 13 %
MCH: 26.3 pg (ref 26.0–34.0)
MCHC: 30.7 g/dL (ref 30.0–36.0)
MCV: 85.5 fL (ref 78.0–100.0)
MONO ABS: 1.1 10*3/uL — AB (ref 0.1–1.0)
MONOS PCT: 8 %
NEUTROS ABS: 10.6 10*3/uL — AB (ref 1.7–7.7)
Neutrophils Relative %: 78 %
Platelets: 237 10*3/uL (ref 150–400)
RBC: 3.92 MIL/uL (ref 3.87–5.11)
RDW: 14.7 % (ref 11.5–15.5)
WBC: 13.6 10*3/uL — ABNORMAL HIGH (ref 4.0–10.5)

## 2018-05-07 LAB — TROPONIN I
TROPONIN I: 0.09 ng/mL — AB (ref <0.03)
TROPONIN I: 0.15 ng/mL — AB (ref <0.03)
Troponin I: 0.1 ng/mL (ref <0.03)

## 2018-05-07 LAB — BRAIN NATRIURETIC PEPTIDE: B NATRIURETIC PEPTIDE 5: 62.3 pg/mL (ref 0.0–100.0)

## 2018-05-07 MED ORDER — ALBUTEROL SULFATE (2.5 MG/3ML) 0.083% IN NEBU
2.5000 mg | INHALATION_SOLUTION | RESPIRATORY_TRACT | Status: DC | PRN
  Administered 2018-05-07 – 2018-05-08 (×2): 2.5 mg via RESPIRATORY_TRACT
  Filled 2018-05-07: qty 3

## 2018-05-07 MED ORDER — FUROSEMIDE 10 MG/ML IJ SOLN
60.0000 mg | Freq: Two times a day (BID) | INTRAMUSCULAR | Status: DC
  Administered 2018-05-07 – 2018-05-08 (×3): 60 mg via INTRAVENOUS
  Filled 2018-05-07 (×3): qty 6

## 2018-05-07 MED ORDER — TRAMADOL HCL 50 MG PO TABS
50.0000 mg | ORAL_TABLET | Freq: Four times a day (QID) | ORAL | Status: DC | PRN
  Administered 2018-05-07: 50 mg via ORAL
  Filled 2018-05-07: qty 1

## 2018-05-07 MED ORDER — FUROSEMIDE 10 MG/ML IJ SOLN
40.0000 mg | Freq: Once | INTRAMUSCULAR | Status: AC
  Administered 2018-05-07: 40 mg via INTRAVENOUS
  Filled 2018-05-07: qty 4

## 2018-05-07 MED ORDER — ACETAMINOPHEN 325 MG PO TABS
650.0000 mg | ORAL_TABLET | ORAL | Status: DC | PRN
  Administered 2018-05-07: 650 mg via ORAL
  Filled 2018-05-07: qty 2

## 2018-05-07 MED ORDER — FUROSEMIDE 10 MG/ML IJ SOLN: 40.0000 mg | Freq: Two times a day (BID) | INTRAMUSCULAR | Status: DC

## 2018-05-07 NOTE — Significant Event (Signed)
Rapid Response Event Note  Overview: Respiratory Distress  Initial Focused Assessment:  Called by RN about patient having acute onset of SOB, desatting into the 80s. Patient was placed on NRB and I asked that RNs call RT and TRH NP because I was with another patient with an emergency. RT/RN stated that patient had crackles.   Interventions: - STAT CXR - Albuterol x 1 given - Lasix 40mg  IV ordered, loss PIV access, PIV re-established and lasix was given - TRH NP and I went saw patient at 615, patient was weaned to 8L VM from NRB 15L and saturations were holding at 100%. Patient was lethargic when we arrived, follow commands but slow to respond. Patient was not in acute distress when we arrived, but ABG obtained for lethargy. - ABG - normal  - Weaned down to 2L [place].    Plan of Care:  - monitor respiratory/mental status.   Event Summary:    at    Call Time [place]  Minh [place] R

## 2018-05-07 NOTE — Consult Note (Signed)
            [facility] CM Primary Care Navigator  05/07/2018  Angelica Beck 21-Feb-1951 [id number]   Went to see patient at the bedsideto identify possible discharge needs. Called and spoke to daughter Angelica Beck) since patient is unable to respond appropriately. Daughter reports that patient had "rectal bleeding" that had led to this admission.  Patient's daughter mentioned that patient's primary care provider is Dr. Clovia Cuff with [facility] who sees patient at home.    Patientuses [facility] (mailed)to obtain medications without difficulty.  Daughter has been managing medicationsfor patient using "pill box" system filled once a week.  Patient's daughter accompanies patient and states using [facility] transportation that provides transport to her specialists' appointments.  Daughter mentioned that she is the primary caregiver for patient at home but when she works, patient has a caregiver through Medicare (CAP) who assists her [place] to Fri from 9am to 5pm.    Anticipated discharge plan ishome with home health services per daughter.  Explained to patient's daughter about [facility] care management services availableforhealth management/ resourcesat homebutshe denies any needs for services at this time and states that she has been helping "manage health issues pretty good so far".  Daughter voiced understanding that patient needs to be followed-up by primary care provider whenshereturns back home, for a post discharge visit within1- 2 weeks post hospitalization.    For additional questions please contact:  Edwena Felty A. Rashunda Passon, BSN, RN-BC [facility] PRIMARY CARE Navigator Cell: [phone]

## 2018-05-07 NOTE — Progress Notes (Signed)
PT Cancellation Note  Patient Details Name: Angelica Beck MRN: [id number] DOB: 1951/03/06   Cancelled Treatment:    Reason Eval/Treat Not Completed: Medical issues which prohibited therapy. Spoke with RN, given respiratory distress in nighttime and pt still with increased WOB, will hold therapy today and check back tomorrow.    [place] 05/07/2018, 10:57 AM

## 2018-05-07 NOTE — Progress Notes (Addendum)
@   0430 Called by NT to room. Pt was being changed due to having BM. Pt noted SOB, O2 sats dropped to 84%. Resp-30. Lung sounds crackles on left side. Placed on non rebreather mask, O2 sats went up to 100%. Respiratory Therapist at bedside and gave breathing tx. Rapid Response Nurse called and unavailable at another floor  with a patient for emergency but ordered stat chest xray for pt. Text page K.Schorr, NP x2 and received new order to give IV lasix. Pt IV site out.Charge nurse attempted IV  x2 but unsuccessful.Placed IV team consult to get new IV site. @0553  new IV placed at RFA and 40mg  IV lasix given. @ 6AM Rosey Bath and Rapid response nurse at bedside to check pt. Received new order for blood gas. NP concerned pt very sleepy @0614 -Left message to daughter to call us @0620  Respiratory Therapist at bedside, got blood gas and placed pt on nasal cannula at 2liters with sats 98%.

## 2018-05-07 NOTE — Progress Notes (Addendum)
Shift event: Called by my colleague who is covering 6N about this pt having some respiratory distress and desaturation into the 80s. Lasix was given and CXR was ordered. This NP to bedside. S: Denies pain. States breathing is better. RN states she was interactive earlier tonight, now very sleepy.  O: poor appearing, chronically ill female in NAD. BP 142/88. HR 114, RR 25 with some use of accessory muscles. SaO2 98% on VM at 45%. Very lethargic but will respond. Knows her name and where she is. Follows commands. Neuro without focal deficit grossly. Lungs with diminished breath sounds and some mild crackles. Card: tachy.  A/P: 1. Acute hypoxic respiratory failure-improving with Lasix. NP concerned about mental status and am getting an ABG to check for hypercarbia. O2 weaned to 2L and satting well.  2. Pulmonary vascular congestion with mild edema on CXR-Lasix given.  3. Parkinson's dementia-? Contributing to mental status but was more alert earlier. Has been receiving her meds for Parkinson's.  4. Hx CHF-see #2. On BB and Lasix.  Awaiting ABG. Do not see need for BIPAP at this time unless hypercarbic. KJKG, NP [facility] Update: ABG looks fine. No hypercarbia. Will watch for now. Hopefully will improve since given Lasix.   Total critical care time: 35 minutes Critical care time was exclusive of separately billable procedures and treating other patients. Critical care was necessary to treat or prevent imminent or life-threatening deterioration. Critical care was time spent personally by me on the following activities: development of treatment plan with patient and/or surrogate as well as nursing, discussions with consultants, evaluation of patient's response to treatment, examination of patient, obtaining history from patient or surrogate, ordering and performing treatments and interventions, ordering and review of laboratory studies, ordering and review of radiographic studies, pulse oximetry and  re-evaluation of patient's condition.

## 2018-05-07 NOTE — Progress Notes (Signed)
Pt has arrived back to room from radiology and is now awake, alert and sitting up in bed eating lunch.  MD notified.  Will continue to monitor.  Angelica Beck

## 2018-05-07 NOTE — Progress Notes (Signed)
LE venous duplex prelim: negative for DVT. Keelan Tripodi Eunice, RDMS, RVT  

## 2018-05-07 NOTE — Progress Notes (Signed)
CRITICAL VALUE ALERT  Critical Value:  Troponin - 0.09  Date & Time Notified: 05/07/2018  @ 1117  Provider Notified: Dr. Grandville Silos @ 1118  Orders Received/Actions taken: None at this time    Angelica Beck

## 2018-05-07 NOTE — Progress Notes (Signed)
Pt has been lethargic all morning.  Unable to arouse pt enough to give PO meds.  Pt's O2 sats 100% on 2L.  Saw order for EKG and daily weights when ordered and asked NT to complete.  Dr. Grandville Silos now rounding and asked for EKG to be done STAT and ordered CT angio.  NT at bedside to check vitals and complete EKG and RR RN called to assist with patient.  Will continue to monitor.  Eliezer [place]

## 2018-05-07 NOTE — Progress Notes (Addendum)
PROGRESS NOTE    Angelica Beck  ION:[id number] DOB: Nov 09, 1950 DOA: 05/02/2018 PCP: Clovia Cuff, MD    Brief Narrative: Angelica Beck is a 67 y.o. female with medical history significant of diabetes, hypertension, hyperthyroidism, history of stroke,chf,ckd stage III vascular dementia admitted with complaints of passing clots via rectum 3-4 times since yesterday.  Patient lives at home with her daughter who takes care of her 24 hours.  No history of nausea vomiting hematemesis or hematuria.  Daughter reported that she is never had anything like this in the past no known history of hemorrhoids diverticulitis .  Patient has had a history of bowel obstruction in the past. she denies any fever, chills, cough, abdominal pain.denies any chest pain shortness of breath. Patient has had a history of long hospital stay complicated by small bowel obstruction trach and PEG in the past.  ED Course: Her blood pressure was 148 111 pulse was 57 respirations 17 sats 95% on room air.  Were significant for hemoglobin of 11.4 which is better than before though she appears somewhat dry normal platelets white count 9.8 sodium 140 potassium 4.1 BUN 22 creatinine 1.64 which is her baseline.   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 Acute hypoxic  respiratory failure likely secondary to acute on chronic diastolic chf exac Patient noted to be hypoxic in the early hours of this morning 05/07/2018 with sats in the 80% and patient had to be placed on a Venturi mask.  Work-up so far with chest x-ray consistent with pulmonary edema/volume overload.  Patient with some use of accessory muscles of respiration.  Patient is chronically bedbound.  Patient lethargic this morning.  Lasix 60 mg IV every 12 hours.  Strict I's and O's.  Daily weights.  Check a VQ scan to rule out acute PE as patient noted to be chronically bedbound and with sudden onset acute shortness of breath.  Check lower extremity Dopplers.  Follow.  2 Acute encephalopathy Questionable etiology.  Likely secondary to problem #1.  ABG obtained with a pH of 7.3/PCO2 of 40/O2 of 70/bicarb of 23.  CBG this morning was 167.  Check a VQ scan to rule out PE.  CT head ordered with no acute abnormalities.  Continue diuretics. Later on this afternoon he was noted per RN that patient more alert came and assessed patient patient is alert complaining of right lower extremity pain eating and is not currently no acute respiratory distress.  Cancel transfer for stepdown unit.  Supportive care.  Follow. Spoke with patient's daughter who stated that patient usually stays up all night and sleeps usually most of the mornings.  Per daughter patient called her on the telephone all night.  3 rectal bleeding/fecal impaction Patient had presented with rectal bleeding on presentation and concern for fecal impaction.  Patient was seen by gastroenterology  and smog enema ordered.  Patient with bowel movement.  Patient seen by GI PA the morning of 05/03/2018, who noted resolution of fecal impaction as rectal vault with thin light brown liquid residuals with no overt bleeding.  Advanced diet to a soft diet which patient is tolerating.  GI following and appreciate input and recommendations.  4.  Postmenopausal bleeding. Patient  noted the evening of 05/03/2018 to have clots coming from her vagina.  Pelvic ultrasound done was concerning for endometrial thickening as well as masses noted on the uterus.  Patient started on Megace 80 mg twice daily which has been decreased to 40 mg twice daily per GYN recommendations.  Outpatient follow-up with GYN for probable endometrial sampling and further evaluation.   5.  Constipation/impaction Resolved.  Patient received a smog enema.  Continue current bowel regimen of MiraLAX and Senokot twice daily.  Continue Dulcolax suppository every other day per GI recommendations.  Follow.    6.  Hyperthyroidism/goiter Decreased Lopressor to 12.5 mg twice daily due to bradycardia and sinus pause of 3.5 noted the morning of 05/04/2018.  Continue methimazole.  Outpatient follow-up.    7.  Dementia due to Parkinson's disease Stable.  Continue Namenda.  8.  Diabetes mellitus  Last hemoglobin A1c was 7.4 on 03/10/2017.  CBG this morning was 167.  Continue Levemir and sliding scale insulin.  9.  Hypertension Stable.  Patient placed on Lopressor however patient noted to be severely bradycardic the morning of 05/04/2018, with 3.5-second pause and as such Lopressor dose decreased.  Patient started on hydralazine for blood pressure control which we will continue for now.    10.  Acute on chronic diastolic heart failure  Diuretics initially held early on in the hospitalization.  Oral diuretics were resumed on 05/05/2018.  Patient noted however in the early hours of this morning 05/07/2018 to going to acute hypoxic respiratory failure desatted into the 80s.  Chest x-ray ordered consistent with volume overload.  Patient was placed on a Venturi mask at 45% with sats going up to 98%.  ABG was obtained which was unremarkable.  Patient given a dose of IV Lasix and urine output not correctly reported.  Cycle cardiac enzymes every 6 hours x3.  Placed on Lasix 60 mg IV every 12 hours.  Strict I's and O's.  Daily weights.   Follow.   11.  Bradycardia with sinus pause Patient noted to have a bradycardia with heart rates in the 30s per RN on 05/04/2018.  Patient asymptomatic.  EKG done showing a first-degree AV block.  TSH within normal limits at 3.481.  2 sets of cardiac enzymes were negative and one at 0.14 likely not accurate.  2D echo was done with a normal EF, diastolic dysfunction, nodule noted on aortic valve that was mobile and per cardiology concerning for possible endocarditis.  Blood cultures ordered and are pending.  Status post TEE.  Bradycardia has improved on decreased dose of Lopressor 12.5 mg twice daily.  Outpatient follow-up.  Follow.    12.  Abnormal 2D echo 2D echo with a EF of 60 to 71%, grade 1 diastolic dysfunction, nodule noted on aortic valve with some mobility.  Concern for endocarditis.  Patient however afebrile.  Blood cultures have been ordered and are pending.  Patient underwent TEE earlier on today by Dr. Meda Coffee which noted a moderately thickened calcified aortic valve.  Left coronary leaflet severely calcified with a masslike calcification measuring 14 x 7 mm with a very small mobile portion.  This does not look like vegetation but rather like a valve degeneration.  Transaortic velocities minimally elevated.  Monitor blood cultures for another 24 hours and if still negative with no growth to date patient should be stable for outpatient follow-up with PCP.    13.  Chronic kidney disease stage III Stable.  Follow.   DVT prophylaxis: SCDs Code Status: Full Family Communication: No family at bedside.  Updated daughter, [name] via telephone. Disposition Plan: Home with home health when medically stable with clinical improvement hopefully the next 24 to 48 hours.   Consultants:   Gastroenterology: Dr. Lyndel Safe 05/02/2018  GYN: Dr. Nehemiah Settle 05/05/2018  Procedures:   2D echo 05/05/2018  Transesophageal echocardiogram 05/06/2018 per Dr. Meda Coffee  Chest x-ray 05/07/2018   CT head: Pending  05/07/2018  VQ scan pending 05/07/2018  Lower extremity Dopplers pending 05/07/2018  Antimicrobials:   None   Subjective: Events overnight noted.  Patient lethargic opens eyes to verbal stimuli and then drifts back off to sleep.  Some use of accessory muscles of respiration.  Shakes head no when asked whether she has chest pain.  No bleeding noted.    Objective: Vitals:   05/07/18 0600 05/07/18 0620 05/07/18 0852 05/07/18 1156  BP: 127/86   (!) 141/80  Pulse: (!) 118   89  Resp: 20 20  15   Temp:    98.6 F (37 C)  TempSrc:    Oral  SpO2: 100% 98% 95% 100%  Weight:    97.7 kg  Height:        Intake/Output Summary (Last 24 hours) at 05/07/2018 1302 Last data filed at 05/07/2018 0445 Gross per 24 hour  Intake 260 ml  Output 200 ml  Net 60 ml   Filed Weights   05/02/18 0252 05/06/18 1234 05/07/18 1156  Weight: 110 kg 110 kg 97.7 kg    Examination:  General exam: Drowsy, lethargic.  Large goiter Respiratory system: Diffuse scattered crackles.  No wheezing, no rhonchi, some use of accessory muscles of respiration. Cardiovascular system: Tachycardic no murmurs rubs or gallops.  No JVD.  1+ lower extremity edema.  Gastrointestinal system: Abdomen is soft, nontender, nondistended, positive bowel sounds.  No rebound.  No guarding.  Central nervous system: Lethargic.  Drowsy.  Moving extremities spontaneously.   Extremities: Symmetric 5 x 5 power. Skin: No rashes, lesions or ulcers Psychiatry: Judgement and insight appear poor. Mood & affect appropriate.     Data Reviewed: I have personally reviewed following labs and imaging studies  CBC: Recent Labs  Lab 05/02/18 0300  05/03/18 0420 05/04/18 0525 05/05/18 0004 05/06/18 0556 05/07/18 0952  WBC 9.8   < > 8.5 8.1 10.5 10.8* 13.6*  NEUTROABS 6.7  --   --   --  7.2  --  10.6*  HGB 11.4*   < > 11.7* 10.0* 9.8* 10.8* 10.3*  HCT 38.5   < > 38.1 33.6* 32.3* 34.9* 33.5*  MCV 87.5   < > 84.7 86.6 86.4 84.1 85.5  PLT 255    < > 190 205 202 220 237   < > = values in this interval not displayed.   Basic Metabolic Panel: Recent Labs  Lab 05/03/18 0420 05/04/18 0525 05/05/18 0004 05/06/18 0556 05/07/18 0952  NA 141 142 134* 138 138  K 4.8 3.6 4.8 4.0 4.0  CL 104 107 103 105 104  CO2 20* 24 21* 24 26  GLUCOSE 125* 122* 162* 134* 153*  BUN 19 15 18 20 19   CREATININE 1.40*  1.30* 1.82* 1.64* 1.48*  CALCIUM 9.3 9.1 8.6* 9.1 9.1   GFR: Estimated Creatinine Clearance: 46.3 mL/min (A) (by C-G formula based on SCr of 1.48 mg/dL (H)). Liver Function Tests: Recent Labs  Lab 05/02/18 0300 05/03/18 0420  AST 20 22  ALT 11 <5  ALKPHOS 123 114  BILITOT 0.7 0.7  PROT 7.3 6.7  ALBUMIN 3.1* 3.1*   No results for input(s): LIPASE, AMYLASE in the last 168 hours. No results for input(s): AMMONIA in the last 168 hours. Coagulation Profile: Recent Labs  Lab 05/03/18 0420  INR 1.09   Cardiac Enzymes: Recent Labs  Lab 05/04/18 1210 05/04/18 1709 05/05/18 0004 05/07/18 0952  TROPONINI <0.03 0.14* <0.03 0.09*   BNP (last 3 results) No results for input(s): PROBNP in the last 8760 hours. HbA1C: No results for input(s): HGBA1C in the last 72 hours. CBG: Recent Labs  Lab 05/06/18 1159 05/06/18 1722 05/06/18 2153 05/07/18 0740 05/07/18 1215  GLUCAP 99 127* 158* 167* 111*   Lipid Profile: No results for input(s): CHOL, HDL, LDLCALC, TRIG, CHOLHDL, LDLDIRECT in the last 72 hours. Thyroid Function Tests: No results for input(s): TSH, T4TOTAL, FREET4, T3FREE, THYROIDAB in the last 72 hours. Anemia Panel: No results for input(s): VITAMINB12, FOLATE, FERRITIN, TIBC, IRON, RETICCTPCT in the last 72 hours. Sepsis Labs: No results for input(s): PROCALCITON, LATICACIDVEN in the last 168 hours.  Recent Results (from the past 240 hour(s))  Urine culture     Status: Abnormal   Collection Time: 05/02/18  6:24 AM  Result Value Ref Range Status   Specimen Description URINE, CATHETERIZED  Final   Special  Requests   Final    NONE Performed at [facility], [address] [place], [place] [address]    Culture MULTIPLE SPECIES PRESENT, SUGGEST RECOLLECTION (A)  Final   Report Status 05/03/2018 FINAL  Final  Culture, blood (Routine X 2) w Reflex to ID Panel     Status: None (Preliminary result)   Collection Time: 05/05/18  4:20 PM  Result Value Ref Range Status   Specimen Description BLOOD LEFT HAND  Final   Special Requests   Final    BOTTLES DRAWN AEROBIC ONLY Blood Culture results may not be optimal due to an inadequate volume of blood received in culture bottles   Culture   Final    NO GROWTH 2 DAYS Performed at [facility], [place] [address], [place], [place] [address]    Report Status PENDING  Incomplete  Culture, blood (Routine X 2) w Reflex to ID Panel     Status: None (Preliminary result)   Collection Time: 05/05/18  4:24 PM  Result Value Ref Range Status   Specimen Description BLOOD LEFT HAND  Final   Special Requests   Final    BOTTLES DRAWN AEROBIC ONLY Blood Culture results may not be optimal due to an inadequate volume of blood received in culture bottles   Culture   Final    NO GROWTH 2 DAYS Performed at [facility], [place] [address], [place], [place] [address]    Report Status PENDING  Incomplete         Radiology Studies: Dg Chest Port 1 View  Result Date: 05/07/2018 CLINICAL DATA:  Respiratory distress EXAM: PORTABLE CHEST 1 VIEW COMPARISON:  04/09/2017 FINDINGS: Shallow inspiration. Cardiac enlargement with mild vascular congestion. Perihilar infiltration suggesting mild edema. There is progression since previous study. Probable small pleural effusions bilaterally. No pneumothorax. Calcification of the aorta. Degenerative  changes in the spine. IMPRESSION: Cardiac enlargement with pulmonary vascular congestion and mild perihilar edema, progressing since previous study. Small bilateral pleural effusions. Electronically Signed   By: Lucienne Capers  M.D.   On: 05/07/2018 05:06        Scheduled Meds: . bisacodyl  10 mg Rectal QODAY  . budesonide  0.5 mg Nebulization BID  . cholecalciferol  1,000 Units Oral Daily  . furosemide  60 mg Intravenous Q12H  . hydrALAZINE  25 mg Oral Q8H  . insulin aspart  0-9 Units Subcutaneous TID WC  . insulin detemir  5 Units Subcutaneous QHS  . megestrol  40 mg Oral BID  . memantine  10 mg Oral BID  . methimazole  10 mg Oral Daily  . metoprolol tartrate  12.5 mg Oral BID  . pantoprazole  40 mg Oral Q0600  . polyethylene glycol  17 g Oral Daily  . senna-docusate  1 tablet Oral BID  . sorbitol, milk of mag, mineral oil, glycerin (SMOG) enema  960 mL Rectal Once   Continuous Infusions:   LOS: 5 days    Time spent: 40 minutes    Irine Seal, MD [facility] Pager [phone]  If 7PM-7AM, please contact night-coverage [email or web address] Password [email or web address] 05/07/2018, 1:02 PM

## 2018-05-08 DIAGNOSIS — R931 Abnormal findings on diagnostic imaging of heart and coronary circulation: Secondary | ICD-10-CM

## 2018-05-08 DIAGNOSIS — I5033 Acute on chronic diastolic (congestive) heart failure: Secondary | ICD-10-CM

## 2018-05-08 DIAGNOSIS — J9601 Acute respiratory failure with hypoxia: Secondary | ICD-10-CM

## 2018-05-08 DIAGNOSIS — G9340 Encephalopathy, unspecified: Secondary | ICD-10-CM

## 2018-05-08 LAB — CBC
HEMATOCRIT: 31.1 % — AB (ref 36.0–46.0)
HEMOGLOBIN: 9.8 g/dL — AB (ref 12.0–15.0)
MCH: 26.3 pg (ref 26.0–34.0)
MCHC: 31.5 g/dL (ref 30.0–36.0)
MCV: 83.6 fL (ref 78.0–100.0)
Platelets: 220 10*3/uL (ref 150–400)
RBC: 3.72 MIL/uL — ABNORMAL LOW (ref 3.87–5.11)
RDW: 14.6 % (ref 11.5–15.5)
WBC: 10.6 10*3/uL — ABNORMAL HIGH (ref 4.0–10.5)

## 2018-05-08 LAB — BASIC METABOLIC PANEL
Anion gap: 10 (ref 5–15)
BUN: 22 mg/dL (ref 8–23)
CHLORIDE: 108 mmol/L (ref 98–111)
CO2: 22 mmol/L (ref 22–32)
CREATININE: 1.57 mg/dL — AB (ref 0.44–1.00)
Calcium: 9 mg/dL (ref 8.9–10.3)
GFR calc Af Amer: 39 mL/min — ABNORMAL LOW (ref >60)
GFR calc non Af Amer: 34 mL/min — ABNORMAL LOW (ref >60)
Glucose, Bld: 127 mg/dL — ABNORMAL HIGH (ref 70–99)
Potassium: 3.6 mmol/L (ref 3.5–5.1)
Sodium: 140 mmol/L (ref 135–145)

## 2018-05-08 LAB — GLUCOSE, CAPILLARY
Glucose-Capillary: 117 mg/dL — ABNORMAL HIGH (ref 70–99)
Glucose-Capillary: 237 mg/dL — ABNORMAL HIGH (ref 70–99)
Glucose-Capillary: 190 mg/dL — ABNORMAL HIGH (ref 70–99)
Glucose-Capillary: 186 mg/dL — ABNORMAL HIGH (ref 70–99)

## 2018-05-08 MED ORDER — IPRATROPIUM-ALBUTEROL 0.5-2.5 (3) MG/3ML IN SOLN
3.0000 mL | Freq: Three times a day (TID) | RESPIRATORY_TRACT | Status: DC
  Administered 2018-05-08: 3 mL via RESPIRATORY_TRACT

## 2018-05-08 MED ORDER — IPRATROPIUM-ALBUTEROL 0.5-2.5 (3) MG/3ML IN SOLN
3.0000 mL | Freq: Three times a day (TID) | RESPIRATORY_TRACT | Status: DC
  Administered 2018-05-08: 3 mL via RESPIRATORY_TRACT
  Filled 2018-05-08 (×2): qty 3

## 2018-05-08 MED ORDER — FUROSEMIDE 10 MG/ML IJ SOLN
40.0000 mg | Freq: Two times a day (BID) | INTRAMUSCULAR | Status: DC
  Administered 2018-05-08 – 2018-05-09 (×2): 40 mg via INTRAVENOUS
  Filled 2018-05-08 (×2): qty 4

## 2018-05-08 NOTE — Progress Notes (Addendum)
Occupational Therapy Treatment Patient Details Name: Angelica Beck MRN: [id number] DOB: [birth year] Today's Date: 05/08/2018    History of present illness Pt is a 67 y/o female with PMH significant of diabetes, hypertension, hyperthyroidism, history of stroke,chf,ckd stage III vascular dementia admitted with complaints of passing clots via rectum 3-4 times since yesterday. Pt found to have fecal impaction with rectal bleeding and questionable vaginal bleeding.   OT comments  Patient progressing slowly.  Completing grooming tasks with setup assist given minimal cueing for thoroughness of tasks.  Utilized maxi move +2-3 assist to transfer from long sitting in bed to recliner, patient anxious and guarding throughout movement.  Oxygen saturations maintained on supplemental via [place], but noted patient wheezing.  Will continue to follow, focus on ADLs. DC plan remains appropriate.    Follow Up Recommendations  SNF;Supervision/Assistance - 24 hour    Equipment Recommendations  None recommended by OT    Recommendations for Other Services PT consult    Precautions / Restrictions Precautions Precautions: Fall Restrictions Weight Bearing Restrictions: No       Mobility Bed Mobility Overal bed mobility: Needs Assistance             General bed mobility comments: pt long sitting in bed upon arrival, refused to lay back as reports "unable to"  Transfers Overall transfer level: Needs assistance     Sit to Stand: Total assist;+2 physical assistance         General transfer comment: used maximove to transfer from long sitting bed to recliner, anxious and guarding against movement      Balance Overall balance assessment: Needs assistance Sitting-balance support: Feet supported Sitting balance-Leahy Scale: Fair Sitting balance - Comments: dynamic sitting in recliner reaching in all planes with SPV                                   ADL either performed or assessed  with clinical judgement   ADL Overall ADL's : Needs assistance/impaired     Grooming: Wash/dry hands;Wash/dry face;Oral care;Set up;Supervision/safety;Sitting Grooming Details (indicate cue type and reason): verbal cueing for thoroughness of tasks                              Functional mobility during ADLs: Total assistance;+2 for physical assistance General ADL Comments: pt long sitting in bed, used maxi move +2-3 assist      Vision       Perception     Praxis      Cognition Arousal/Alertness: Awake/alert Behavior During Therapy: Flat affect Overall Cognitive Status: No family/caregiver present to determine baseline cognitive functioning Area of Impairment: Memory;Following commands;Safety/judgement;Problem solving                     Memory: Decreased short-term memory Following Commands: Follows one step commands inconsistently;Follows one step commands with increased time Safety/Judgement: Decreased awareness of deficits;Decreased awareness of safety   Problem Solving: Slow processing;Decreased initiation;Difficulty sequencing;Requires verbal cues;Requires tactile cues General Comments: Pt requires increased time to process and respond to questions.  Pt anxious throughout session.         Exercises     Shoulder Instructions       General Comments      Pertinent Vitals/ Pain       Pain Assessment: Faces Faces Pain Scale: Hurts even more Pain Location: B LEs Pain Descriptors /  Indicators: Sore;Grimacing;Guarding Pain Intervention(s): Limited activity within patient's tolerance;Repositioned  Home Living                                          Prior Functioning/Environment              Frequency  Min 2X/week        Progress Toward Goals  OT Goals(current goals can now be found in the care plan section)  Progress towards OT goals: Progressing toward goals  Acute Rehab OT Goals Patient Stated Goal: unable  to state OT Goal Formulation: Patient unable to participate in goal setting Time For Goal Achievement: 05/18/18 Potential to Achieve Goals: Good  Plan Discharge plan remains appropriate;Frequency remains appropriate    Co-evaluation    PT/OT/SLP Co-Evaluation/Treatment: Yes Reason for Co-Treatment: For patient/therapist safety;To address functional/ADL transfers   OT goals addressed during session: ADL's and self-care      AM-PAC PT "6 Clicks" Daily Activity     Outcome Measure   Help from another person eating meals?: A Little Help from another person taking care of personal grooming?: A Little Help from another person toileting, which includes using toliet, bedpan, or urinal?: Total Help from another person bathing (including washing, rinsing, drying)?: A Lot Help from another person to put on and taking off regular upper body clothing?: A Lot Help from another person to put on and taking off regular lower body clothing?: Total 6 Click Score: 12    End of Session Equipment Utilized During Treatment: Oxygen;Other (comment)(maximove)  OT Visit Diagnosis: Unsteadiness on feet (R26.81);Other abnormalities of gait and mobility (R26.89);Muscle weakness (generalized) (M62.81);Pain;Other symptoms and signs involving cognitive function Pain - Right/Left: (Bilaterally) Pain - part of body: Leg   Activity Tolerance Patient limited by pain;Patient limited by fatigue   Patient Left in chair;with call bell/phone within reach   Nurse Communication Mobility status        Time: 1194-1740 OT Time Calculation (min): 29 min  Charges: OT General Charges $OT Visit: 1 Visit OT Treatments $Self Care/Home Management : 8-22 mins  Delight Stare, OTR/L  Pager [place] 05/08/2018, 4:30 PM

## 2018-05-08 NOTE — Progress Notes (Signed)
PROGRESS NOTE        PATIENT DETAILS Name: Angelica Beck Age: 67 y.o. Sex: female Date of Birth: 1951/03/27 Admit Date: 05/02/2018 Admitting Physician Georgette Shell, MD UJW:[id number], Arnette Norris, MD  Brief Narrative: Patient is a 67 y.o. female (bedbound and nonambulatory) with prior history of DM, hypertension, hypothyroidism, history of CVA, vascular dementia, CKD stage III presented with lower GI bleeding.  As per course complicated by fecal impaction sports, abnormal echocardiogram requiring TEE and lately decompensated diastolic heart failure.  See below for further details  Subjective: Claims that she feels better this morning.  Appears to be slightly but pleasantly confused.  Assessment/Plan: Acute on chronic hypoxic respiratory failure secondary to decompensated diastolic heart failure: Improved, continue with Lasix.  Attempt to titrate off oxygen.  Follow weights, electrolytes and intake output.  Nocturnal O2-2 L at home  Acute metabolic encephalopathy: Thought to be secondary to hypoxia, apparently she was more confused yesterday afternoon-however this morning-she was probably back to her baseline, she was able to answer almost all my questions appropriately.  Minimally elevated troponins: Trend is flat and not consistent with ACS-she has dementia, bedbound-I do not think further work-up will change management.  Echocardiogram did not show any wall motion abnormality, EF was preserved.  Painless rectal bleeding: Resolved, this was related to fecal impaction.  Some question of whether this was actually vaginal bleeding that may have been misdiagnosed as rectal bleeding.  GI consulted no further recommendations, GI does not think patient is a good candidate for elective colonoscopy as she is bedbound.  Vaginal bleeding: Seen by GYN-started on Megace.  Pelvic ultrasound showed endometrial thickening.  Stable for further work-up to be done in the outpatient  setting.  No further vaginal bleeding noted.  Fecal impaction/constipation: Resolved after a smog enema-continue bowel regimen with MiraLAX and Senokot.  Hyperthyroidism: Continue methimazole.  Bradycardia with sinus pause: On 8/10 she was noted to be bradycardic with a pause-metoprolol was decreased to 12.5 mg twice daily.  Follow closely for now.  Hypertension: Controlled with metoprolol and hydralazine-dosage reduced as patient had a 3.5-second pause with bradycardia on 8/10.  CKD stage III: Creatinine close to usual baseline.  Follow.  Insulin-dependent DM-2: CBG stable with 5 units of Levemir and SSI.  Follow and adjust.  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  Abnormal 2D echocardiogram: Concern for vegetation on aortic valve-underwent TEE-felt to have a calcified aortic valve, with a masslike calcification on the left coronary leaflet.  This was not felt to be vegetation.  Blood cultures negative so far.    Bedbound status.  DVT Prophylaxis: SCD's  Code Status: Full code   Family Communication: Daughter over the phone  Disposition Plan: Remain inpatient-clinical improvement continues-home most likely tomorrow.  Antimicrobial agents: Anti-infectives (From admission, onward)   None      Procedures: 8/12>> TEE  CONSULTS:  cardiology, GI and gynecology  Time spent: 25- minutes-Greater than 50% of this time was spent in counseling, explanation of diagnosis, planning of further management, and coordination of care.  MEDICATIONS: Scheduled Meds: . bisacodyl  10 mg Rectal QODAY  . budesonide  0.5 mg Nebulization BID  . cholecalciferol  1,000 Units Oral Daily  . furosemide  60 mg Intravenous Q12H  . hydrALAZINE  25 mg Oral Q8H  . insulin aspart  0-9 Units Subcutaneous TID WC  . insulin  detemir  5 Units Subcutaneous QHS  . ipratropium-albuterol  3 mL Nebulization Q8H  . megestrol  40 mg Oral BID  . memantine  10 mg Oral BID  . methimazole  10  mg Oral Daily  . metoprolol tartrate  12.5 mg Oral BID  . pantoprazole  40 mg Oral Q0600  . polyethylene glycol  17 g Oral Daily  . senna-docusate  1 tablet Oral BID  . sorbitol, milk of mag, mineral oil, glycerin (SMOG) enema  960 mL Rectal Once   Continuous Infusions: PRN Meds:.acetaminophen, albuterol, hydrALAZINE, traMADol   PHYSICAL EXAM: Vital signs: Vitals:   05/08/18 0500 05/08/18 0542 05/08/18 0850 05/08/18 1409  BP:  115/75  109/77  Pulse:  97  (!) 102  Resp:  18  19  Temp:  98.6 F (37 C)  98.2 F (36.8 C)  TempSrc:  Oral  Oral  SpO2:  100% 100% 100%  Weight: 95.1 kg     Height:       Filed Weights   05/06/18 1234 05/07/18 1156 05/08/18 0500  Weight: 110 kg 97.7 kg 95.1 kg   Body mass index is 31.88 kg/m.   General appearance :Awake, mostly alert, not in any distress.  Frail appearing Eyes:Pink conjunctiva HEENT: Atraumatic and Normocephalic Neck: supple, no JVD. No cervical lymphadenopathy. No thyromegaly Resp:Good air entry bilaterally, some transmitted upper airway sounds-very few bibasilar rales. CVS: S1 S2 regular, no murmurs.  GI: Bowel sounds present, Non tender and not distended with no gaurding, rigidity or rebound.No organomegaly Extremities: B/L Lower Ext shows no edema, both legs are warm to touch Musculoskeletal:No digital cyanosis Skin:No Rash, warm and dry Wounds:N/A  I have personally reviewed following labs and imaging studies  LABORATORY DATA: CBC: Recent Labs  Lab 05/02/18 0300  05/04/18 0525 05/05/18 0004 05/06/18 0556 05/07/18 0952 05/08/18 0502  WBC 9.8   < > 8.1 10.5 10.8* 13.6* 10.6*  NEUTROABS 6.7  --   --  7.2  --  10.6*  --   HGB 11.4*   < > 10.0* 9.8* 10.8* 10.3* 9.8*  HCT 38.5   < > 33.6* 32.3* 34.9* 33.5* 31.1*  MCV 87.5   < > 86.6 86.4 84.1 85.5 83.6  PLT 255   < > 205 202 220 237 220   < > = values in this interval not displayed.    Basic Metabolic Panel: Recent Labs  Lab 05/04/18 0525 05/05/18 0004  05/06/18 0556 05/07/18 0952 05/08/18 0502  NA 142 134* 138 138 140  K 3.6 4.8 4.0 4.0 3.6  CL 107 103 105 104 108  CO2 24 21* 24 26 22   GLUCOSE 122* 162* 134* 153* 127*  BUN 15 18 20 19 22   CREATININE 1.30* 1.82* 1.64* 1.48* 1.57*  CALCIUM 9.1 8.6* 9.1 9.1 9.0    GFR: Estimated Creatinine Clearance: 43.1 mL/min (A) (by C-G formula based on SCr of 1.57 mg/dL (H)).  Liver Function Tests: Recent Labs  Lab 05/02/18 0300 05/03/18 0420  AST 20 22  ALT 11 <5  ALKPHOS 123 114  BILITOT 0.7 0.7  PROT 7.3 6.7  ALBUMIN 3.1* 3.1*   No results for input(s): LIPASE, AMYLASE in the last 168 hours. No results for input(s): AMMONIA in the last 168 hours.  Coagulation Profile: Recent Labs  Lab 05/03/18 0420  INR 1.09    Cardiac Enzymes: Recent Labs  Lab 05/04/18 1709 05/05/18 0004 05/07/18 0952 05/07/18 1444 05/07/18 2119  TROPONINI 0.14* <0.03 0.09* 0.15* 0.10*  BNP (last 3 results) No results for input(s): PROBNP in the last 8760 hours.  HbA1C: No results for input(s): HGBA1C in the last 72 hours.  CBG: Recent Labs  Lab 05/07/18 1215 05/07/18 1653 05/07/18 2109 05/08/18 0749 05/08/18 1145  GLUCAP 111* 161* 167* 117* 237*    Lipid Profile: No results for input(s): CHOL, HDL, LDLCALC, TRIG, CHOLHDL, LDLDIRECT in the last 72 hours.  Thyroid Function Tests: No results for input(s): TSH, T4TOTAL, FREET4, T3FREE, THYROIDAB in the last 72 hours.  Anemia Panel: No results for input(s): VITAMINB12, FOLATE, FERRITIN, TIBC, IRON, RETICCTPCT in the last 72 hours.  Urine analysis:    Component Value Date/Time   COLORURINE AMBER (A) 05/02/2018 0626   APPEARANCEUR HAZY (A) 05/02/2018 0626   LABSPEC 1.012 05/02/2018 0626   PHURINE 5.0 05/02/2018 0626   GLUCOSEU NEGATIVE 05/02/2018 0626   HGBUR LARGE (A) 05/02/2018 0626   BILIRUBINUR NEGATIVE 05/02/2018 0626   KETONESUR NEGATIVE 05/02/2018 0626   PROTEINUR 100 (A) 05/02/2018 0626   UROBILINOGEN 0.2 08/02/2015  0935   NITRITE NEGATIVE 05/02/2018 0626   LEUKOCYTESUR NEGATIVE 05/02/2018 0626    Sepsis Labs: Lactic Acid, Venous    Component Value Date/Time   LATICACIDVEN 0.83 12/21/2016 1626    MICROBIOLOGY: Recent Results (from the past 240 hour(s))  Urine culture     Status: Abnormal   Collection Time: 05/02/18  6:24 AM  Result Value Ref Range Status   Specimen Description URINE, CATHETERIZED  Final   Special Requests   Final    NONE Performed at [facility], [place] [address], [place], [place] [address]    Culture MULTIPLE SPECIES PRESENT, SUGGEST RECOLLECTION (A)  Final   Report Status 05/03/2018 FINAL  Final  Culture, blood (Routine X 2) w Reflex to ID Panel     Status: None (Preliminary result)   Collection Time: 05/05/18  4:20 PM  Result Value Ref Range Status   Specimen Description BLOOD LEFT HAND  Final   Special Requests   Final    BOTTLES DRAWN AEROBIC ONLY Blood Culture results may not be optimal due to an inadequate volume of blood received in culture bottles   Culture   Final    NO GROWTH 3 DAYS Performed at [facility], [place] [address] [place], [place] [address]    Report Status PENDING  Incomplete  Culture, blood (Routine X 2) w Reflex to ID Panel     Status: None (Preliminary result)   Collection Time: 05/05/18  4:24 PM  Result Value Ref Range Status   Specimen Description BLOOD LEFT HAND  Final   Special Requests   Final    BOTTLES DRAWN AEROBIC ONLY Blood Culture results may not be optimal due to an inadequate volume of blood received in culture bottles   Culture   Final    NO GROWTH 3 DAYS Performed at [facility], [place] [address] [place], [place] [address]    Report Status PENDING  Incomplete    RADIOLOGY STUDIES/RESULTS: Ct Head Wo Contrast  Result Date: 05/07/2018 CLINICAL DATA:  Encephalopathy. EXAM: CT HEAD WITHOUT CONTRAST TECHNIQUE: Contiguous axial images were obtained from the base of the skull through the vertex without  intravenous contrast. COMPARISON:  CT scan of June 27, 2016. FINDINGS: Brain: Mild chronic ischemic white matter disease is noted. Old bilateral basal ganglia lacunar infarctions are noted. No mass effect or midline shift is noted. Ventricular size is within normal limits. There is no evidence of mass  lesion, hemorrhage or acute infarction. Vascular: No hyperdense vessel or unexpected calcification. Skull: Normal. Negative for fracture or focal lesion. Sinuses/Orbits: No acute finding. Other: None. IMPRESSION: Mild chronic ischemic white matter disease. No acute intracranial abnormality seen. Electronically Signed   By: Marijo Conception, M.D.   On: 05/07/2018 13:51   Dg Chest Port 1 View  Result Date: 05/07/2018 CLINICAL DATA:  Respiratory distress EXAM: PORTABLE CHEST 1 VIEW COMPARISON:  04/09/2017 FINDINGS: Shallow inspiration. Cardiac enlargement with mild vascular congestion. Perihilar infiltration suggesting mild edema. There is progression since previous study. Probable small pleural effusions bilaterally. No pneumothorax. Calcification of the aorta. Degenerative changes in the spine. IMPRESSION: Cardiac enlargement with pulmonary vascular congestion and mild perihilar edema, progressing since previous study. Small bilateral pleural effusions. Electronically Signed   By: Lucienne Capers M.D.   On: 05/07/2018 05:06   US Pelvic Complete With Transvaginal  Result Date: 05/03/2018 CLINICAL DATA:  Study is limited due to patient body habitus and the patient's inability to tolerate the transvaginal probe. EXAM: TRANSABDOMINAL AND TRANSVAGINAL ULTRASOUND OF PELVIS TECHNIQUE: Both transabdominal and transvaginal ultrasound examinations of the pelvis were performed. Transabdominal technique was performed for global imaging of the pelvis including uterus, ovaries, adnexal regions, and pelvic cul-de-sac. It was necessary to proceed with endovaginal exam following the transabdominal exam to visualize the  endometrium and ovaries. COMPARISON:  None FINDINGS: Uterus Measurements: 12.3 x 8.0 x 8.4 cm. There appear to be 2 masses in the uterus measuring 3.5 x 2.3 x 3.2 cm and 4.3 x 3.9 x 4.3 cm. Endometrium Thickness: 21.1 mm.  There is fluid in the endometrial canal. Right ovary Not visualized. Left ovary Not visualized. Other findings No abnormal free fluid. IMPRESSION: 1. The study is significantly limited due to the patient's body habitus and the patient's inability to tolerate the vaginal probe. 2. The endometrium appears to be significantly thickened measuring up to 21.1 mm. There is fluid in the canal. In the setting of post-menopausal bleeding, endometrial sampling is indicated to exclude carcinoma. If results are benign, sonohysterogram should be considered for focal lesion work-up. (Ref: Radiological Reasoning: Algorithmic Workup of Abnormal Vaginal Bleeding with Endovaginal Sonography and Sonohysterography. AJR 2008; 161:W96-04) 3. Two apparent fibroids. Electronically Signed   By: Dorise Bullion III M.D   On: 05/03/2018 12:42     LOS: 6 days   Oren Binet, MD  [facility]  If 7PM-7AM, please contact night-coverage  Please page via [email or web address]-Password [email or web address]-click on MD name and type text message  05/08/2018, 3:17 PM

## 2018-05-08 NOTE — Progress Notes (Signed)
Physical Therapy Treatment Patient Details Name: Angelica Beck MRN: [id number] DOB: [birth year] Today's Date: 05/08/2018    History of Present Illness Pt is a 67 y/o female with PMH significant of diabetes, hypertension, hyperthyroidism, history of stroke,chf,ckd stage III vascular dementia admitted with complaints of passing clots via rectum 3-4 times since yesterday. Pt found to have fecal impaction with rectal bleeding and questionable vaginal bleeding.    PT Comments    Patient continuing to be limited by bilateral lower extremity and back pain. Session focused on Maximove transfer from bed to chair to promote out of bed mobility as well as dynamic sitting balance to progress core/trunk stabilization. Patient's wheelchair now in room so may benefit from wheelchair mobility training in future session but will need a lift transfer into chair; wheelchair mobility goal added. SNF remains appropriate.     Follow Up Recommendations  SNF     Equipment Recommendations  None recommended by PT    Recommendations for Other Services       Precautions / Restrictions Precautions Precautions: Fall Restrictions Weight Bearing Restrictions: No    Mobility  Bed Mobility Overal bed mobility: Needs Assistance             General bed mobility comments: pt long sitting in bed upon arrival, refused to lay back as reports "unable to." maximove sling applied in long sitting  Transfers Overall transfer level: Needs assistance     Sit to Stand: Total assist;+2 physical assistance         General transfer comment: used maximove to transfer from long sitting bed to recliner, anxious and guarding against movement    Ambulation/Gait                 Stairs             Wheelchair Mobility    Modified Rankin (Stroke Patients Only)       Balance Overall balance assessment: Needs assistance Sitting-balance support: Feet supported Sitting balance-Leahy Scale:  Fair Sitting balance - Comments: dynamic sitting in recliner reaching in all planes with supervision                                    Cognition Arousal/Alertness: Awake/alert Behavior During Therapy: Flat affect Overall Cognitive Status: No family/caregiver present to determine baseline cognitive functioning Area of Impairment: Memory;Following commands;Safety/judgement;Problem solving                     Memory: Decreased short-term memory Following Commands: Follows one step commands inconsistently;Follows one step commands with increased time Safety/Judgement: Decreased awareness of deficits;Decreased awareness of safety   Problem Solving: Slow processing;Decreased initiation;Difficulty sequencing;Requires verbal cues;Requires tactile cues General Comments: Pt requires increased time to process and respond to questions.  Pt anxious and emotionally labile throughout session.       Exercises Other Exercises Other Exercises: Dynamic sitting balance with no back support with functional reaching in all planes     General Comments        Pertinent Vitals/Pain Pain Assessment: Faces Faces Pain Scale: Hurts whole lot Pain Location: BLEs with palpation/movement, back Pain Descriptors / Indicators: Sore;Grimacing;Guarding Pain Intervention(s): Limited activity within patient's tolerance;Monitored during session    Home Living                      Prior Function  PT Goals (current goals can now be found in the care plan section) Acute Rehab PT Goals Patient Stated Goal: unable to state PT Goal Formulation: Patient unable to participate in goal setting Time For Goal Achievement: 05/18/18 Potential to Achieve Goals: Fair Additional Goals Additional Goal #1: Pt will propel wheelchair x 50 feet with min assist    Frequency    Min 2X/week      PT Plan Current plan remains appropriate    Co-evaluation PT/OT/SLP  Co-Evaluation/Treatment: Yes Reason for Co-Treatment: Complexity of the patient's impairments (multi-system involvement);Necessary to address cognition/behavior during functional activity;For patient/therapist safety;To address functional/ADL transfers PT goals addressed during session: Mobility/safety with mobility OT goals addressed during session: ADL's and self-care      AM-PAC PT "6 Clicks" Daily Activity  Outcome Measure  Difficulty turning over in bed (including adjusting bedclothes, sheets and blankets)?: Unable Difficulty moving from lying on back to sitting on the side of the bed? : Unable Difficulty sitting down on and standing up from a chair with arms (e.g., wheelchair, bedside commode, etc,.)?: Unable Help needed moving to and from a bed to chair (including a wheelchair)?: Total Help needed walking in hospital room?: Total Help needed climbing 3-5 steps with a railing? : Total 6 Click Score: 6    End of Session   Activity Tolerance: Patient limited by pain Patient left: in chair;with call bell/phone within reach;with chair alarm set Nurse Communication: Need for lift equipment PT Visit Diagnosis: Other abnormalities of gait and mobility (R26.89);Pain     Time: 5284-1324 PT Time Calculation (min) (ACUTE ONLY): 23 min  Charges:  $Therapeutic Activity: 8-22 mins                     Ellamae Sia, PT, DPT Acute Rehabilitation Services  Pager: [phone]    Willy Eddy 05/08/2018, 4:59 PM

## 2018-05-09 DIAGNOSIS — R001 Bradycardia, unspecified: Secondary | ICD-10-CM

## 2018-05-09 LAB — GLUCOSE, CAPILLARY
GLUCOSE-CAPILLARY: 135 mg/dL — AB (ref 70–99)
Glucose-Capillary: 222 mg/dL — ABNORMAL HIGH (ref 70–99)

## 2018-05-09 MED ORDER — METOPROLOL TARTRATE 25 MG PO TABS: 12.5000 mg | ORAL_TABLET | Freq: Two times a day (BID) | ORAL | 0 refills | Status: AC

## 2018-05-09 MED ORDER — FUROSEMIDE 20 MG PO TABS: 40.0000 mg | ORAL_TABLET | Freq: Every day | ORAL | 0 refills | Status: AC

## 2018-05-09 MED ORDER — PANTOPRAZOLE SODIUM 40 MG PO TBEC: 40.0000 mg | DELAYED_RELEASE_TABLET | Freq: Every day | ORAL | 0 refills | Status: DC

## 2018-05-09 MED ORDER — SENNOSIDES-DOCUSATE SODIUM 8.6-50 MG PO TABS: 1.0000 | ORAL_TABLET | Freq: Every day | ORAL | 0 refills | Status: DC

## 2018-05-09 MED ORDER — MEGESTROL ACETATE 40 MG PO TABS: 40.0000 mg | ORAL_TABLET | Freq: Two times a day (BID) | ORAL | 0 refills | Status: DC

## 2018-05-09 MED ORDER — HYDRALAZINE HCL 25 MG PO TABS: 25.0000 mg | ORAL_TABLET | Freq: Three times a day (TID) | ORAL | 0 refills | Status: AC

## 2018-05-09 MED ORDER — POLYETHYLENE GLYCOL 3350 17 G PO PACK: 17.0000 g | PACK | Freq: Every day | ORAL | 0 refills | Status: DC

## 2018-05-09 MED ORDER — IPRATROPIUM-ALBUTEROL 0.5-2.5 (3) MG/3ML IN SOLN
3.0000 mL | Freq: Two times a day (BID) | RESPIRATORY_TRACT | Status: DC
  Administered 2018-05-09: 3 mL via RESPIRATORY_TRACT
  Filled 2018-05-09: qty 3

## 2018-05-09 NOTE — Progress Notes (Signed)
Patient's granddaughter arrived at hospital and this RN reviewed AVS with granddaughter. Patient's daughter called and informed me the patient will be transferred by [facility], she has already arranged transport to arrive at 1:30pm.

## 2018-05-09 NOTE — Social Work (Signed)
Confirmed with RN Case Manager Manuela Schwartz that pt is set up with home health. Pt daughter has arranged discharge home with [facility] transport.   CSW signing off. Please consult if any additional needs arise.  Alexander Mt, [place] Work [phone]

## 2018-05-09 NOTE — Discharge Summary (Signed)
PATIENT DETAILS Name: Angelica Beck Age: 67 y.o. Sex: female Date of Birth: [birth year] MRN: [id number]. Admitting Physician: Georgette Shell, MD CLE:[id number], Arnette Norris, MD  Admit Date: 05/02/2018 Discharge date: 05/09/2018  Recommendations for Outpatient Follow-up:  1. Follow up with PCP in 1-2 weeks 2. Please obtain BMP/CBC in one week 3. Please ensure follow-up with GYN at [facility].  Admitted From:  Home  Disposition: Home with home health [place]:  Yes  Equipment/Devices: None  Discharge Condition: Stable  CODE STATUS: FULL CODE  Diet recommendation:  Heart Healthy / Carb Modified   Brief Summary: Patient is a 67 y.o. female (bedbound and nonambulatory) with prior history of DM, hypertension, hypothyroidism, history of CVA, vascular dementia, CKD stage III presented with lower GI bleeding.  As per course complicated by fecal impaction sports, abnormal echocardiogram requiring TEE and lately decompensated diastolic heart failure.  See below for further details  Brief Hospital Course: Acute on chronic hypoxic respiratory failure secondary to decompensated diastolic heart failure: Improved, continue with IV Lasix.    Lungs are much more clear today compared to yesterday.  On home O2 at night.  Acute metabolic encephalopathy: Thought to be secondary to hypoxia, she is much more awake and alert compared to the past few days.  This morning she wanted to go home-apparently her daughter is getting married tomorrow.  She has improved and her mental status is now back to baseline.   Minimally elevated troponins: Trend is flat and not consistent with ACS-she has dementia, bedbound-I do not think further work-up will change management.  Echocardiogram did not show any wall motion abnormality, EF was preserved.  Painless rectal bleeding: Resolved, this was related to fecal impaction.  Some question of whether this was actually vaginal bleeding that  may have been misdiagnosed as rectal bleeding.  GI consulted no further recommendations, GI does not think patient is a good candidate for elective colonoscopy as she is bedbound.  Vaginal bleeding: Seen by GYN-started on Megace.  Pelvic ultrasound showed endometrial thickening.  Stable for further work-up to be done in the outpatient setting.  No further vaginal bleeding noted.  Fecal impaction/constipation: Resolved after a smog enema-continue bowel regimen with MiraLAX and Senokot.  Hyperthyroidism: Continue methimazole.  Bradycardia with sinus pause: On 8/10 she was noted to be bradycardic with a pause-metoprolol was decreased to 12.5 mg twice daily.  Follow closely for now.  Hypertension: Controlled with metoprolol and hydralazine-optimize further in the outpatient setting  CKD stage III: Creatinine close to usual baseline.  Follow.  Insulin-dependent DM-2: CBG stable with Levemir and SSI, resume home regimen and follow closely in the outpatient setting.  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  Abnormal 2D echocardiogram: Concern for vegetation on aortic valve-underwent TEE-felt to have a calcified aortic valve, with a masslike calcification on the left coronary leaflet.  This was not felt to be vegetation.  Blood cultures negative so far.    Bedbound status.  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)   Acute encephalopathy   History of CVA (cerebrovascular accident)   Chronic systolic HF (heart failure) (HCC)   Cognitive impairment   Essential hypertension   CKD (chronic kidney disease), stage III (HCC)   Acute on chronic diastolic CHF (congestive heart failure) (HCC)   Rectal bleeding   GI bleed  Renal insufficiency   Dementia due to Parkinson's disease without behavioral disturbance  (HCC)   Vaginal bleeding   Endometrial thickening on ultrasound   Bradycardia   Postmenopausal bleeding   Abnormal echocardiogram   Respiratory distress   Discharge Instructions:  Activity:  As tolerated-bed to wheelchair bound at baseline (nonambulatory)   Discharge Instructions    Diet - low sodium heart healthy   Complete by:  As directed    Discharge instructions   Complete by:  As directed    Follow with Primary MD  Clovia Cuff, MD in 1 week  Please get a complete blood count and chemistry panel checked by your Primary MD at your next visit, and again as instructed by your Primary MD.  Get Medicines reviewed and adjusted: Please take all your medications with you for your next visit with your Primary MD  Laboratory/radiological data: Please request your Primary MD to go over all hospital tests and procedure/radiological results at the follow up, please ask your Primary MD to get all Hospital records sent to his/her office.  In some cases, they will be blood work, cultures and biopsy results pending at the time of your discharge. Please request that your primary care M.D. follows up on these results.  Also Note the following: If you experience worsening of your admission symptoms, develop shortness of breath, life threatening emergency, suicidal or homicidal thoughts you must seek medical attention immediately by calling 911 or calling your MD immediately  if symptoms less severe.  You must read complete instructions/literature along with all the possible adverse reactions/side effects for all the Medicines you take and that have been prescribed to you. Take any new Medicines after you have completely understood and accpet all the possible adverse reactions/side effects.   Do not drive when taking Pain medications or sleeping medications (Benzodaizepines)  Do not take more than prescribed Pain, Sleep and Anxiety Medications. It is not advisable to combine anxiety,sleep  and pain medications without talking with your primary care practitioner  Special Instructions: If you have smoked or chewed Tobacco  in the last 2 yrs please stop smoking, stop any regular Alcohol  and or any Recreational drug use.  Wear Seat belts while driving.  Please note: You were cared for by a hospitalist during your hospital stay. Once you are discharged, your primary care physician will handle any further medical issues. Please note that NO REFILLS for any discharge medications will be authorized once you are discharged, as it is imperative that you return to your primary care physician (or establish a relationship with a primary care physician if you do not have one) for your post hospital discharge needs so that they can reassess your need for medications and monitor your lab values.   Increase activity slowly   Complete by:  As directed      Allergies as of 05/09/2018      Reactions   Penicillins Swelling   FACIAL SWELLING Has patient had a PCN reaction causing immediate rash, facial/tongue/throat swelling, SOB or lightheadedness with hypotension: Yes Has patient had a PCN reaction causing severe rash involving mucus membranes or skin necrosis: No Has patient had a PCN reaction that required hospitalization: No Has patient had a PCN reaction occurring within the last 10 years: No If all of the above answers are "NO", then may proceed with Cephalosporin use.   Lactose Intolerance (gi) Diarrhea, Nausea And Vomiting      Medication List    STOP taking these medications  aspirin 81 MG chewable tablet     TAKE these medications   acetaminophen 500 MG tablet Commonly known as:  TYLENOL Take 500 mg by mouth every 6 (six) hours as needed for mild pain.   atorvastatin 40 MG tablet Commonly known as:  LIPITOR Take 40 mg by mouth every evening.   b complex-vitamin c-folic acid 0.8 MG Tabs tablet Take 1 tablet by mouth every morning.   budesonide 0.5 MG/2ML nebulizer  solution Commonly known as:  PULMICORT Take 0.5 mg by nebulization 2 (two) times daily.   cholecalciferol 1000 units tablet Commonly known as:  VITAMIN D Take 1,000 Units by mouth daily.   docusate sodium 100 MG capsule Commonly known as:  COLACE Take 100 mg by mouth daily.   famotidine 20 MG tablet Commonly known as:  PEPCID Take 20 mg by mouth daily.   furosemide 20 MG tablet Commonly known as:  LASIX Take 2 tablets (40 mg total) by mouth daily. Take 40 mg p.o. daily for 1 week, and then drop down to 20 mg daily. What changed:    how much to take  additional instructions   hydrALAZINE 25 MG tablet Commonly known as:  APRESOLINE Take 1 tablet (25 mg total) by mouth every 8 (eight) hours.   insulin detemir 100 UNIT/ML injection Commonly known as:  LEVEMIR Inject 10 Units into the skin at bedtime.   ipratropium-albuterol 0.5-2.5 (3) MG/3ML Soln Commonly known as:  DUONEB Take 3 mLs by nebulization every 4 (four) hours as needed (for dyspnea).   megestrol 40 MG tablet Commonly known as:  MEGACE Take 1 tablet (40 mg total) by mouth 2 (two) times daily.   Melatonin 3 MG Tabs Take 3 mg by mouth at bedtime.   memantine 10 MG tablet Commonly known as:  NAMENDA Take 10 mg by mouth 2 (two) times daily.   methimazole 10 MG tablet Commonly known as:  TAPAZOLE Take 10 mg by mouth daily.   metoprolol tartrate 25 MG tablet Commonly known as:  LOPRESSOR Take 0.5 tablets (12.5 mg total) by mouth 2 (two) times daily. What changed:    medication strength  how much to take   ondansetron 4 MG tablet Commonly known as:  ZOFRAN Take 4 mg by mouth every 8 (eight) hours as needed for nausea or vomiting.   pantoprazole 40 MG tablet Commonly known as:  PROTONIX Take 1 tablet (40 mg total) by mouth daily at 6 (six) AM. Start taking on:  05/10/2018   polyethylene glycol packet Commonly known as:  MIRALAX / GLYCOLAX Take 17 g by mouth daily.   potassium chloride SA 20 MEQ  tablet Commonly known as:  K-DUR,KLOR-CON Take 20 mEq by mouth every Monday, Wednesday, and Friday.   senna-docusate 8.6-50 MG tablet Commonly known as:  Senokot-S Take 1 tablet by mouth at bedtime.      Follow-up [place] for [facility]. Schedule an appointment as soon as possible for a visit in 3 week(s).   Specialty:  Obstetrics and Gynecology Contact information: [place] Follow up.   Specialty:  Home Health Services Contact information: [address] [place] [address] [phone]        Clovia Cuff, MD. Schedule an appointment as soon as possible for a visit in 1 week(s).   Specialty:  Internal Medicine Contact information: [address] [place] [address] [phone]  Allergies  Allergen Reactions  . Penicillins Swelling    FACIAL SWELLING Has patient had a PCN reaction causing immediate rash, facial/tongue/throat swelling, SOB or lightheadedness with hypotension: Yes Has patient had a PCN reaction causing severe rash involving mucus membranes or skin necrosis: No Has patient had a PCN reaction that required hospitalization: No Has patient had a PCN reaction occurring within the last 10 years: No If all of the above answers are "NO", then may proceed with Cephalosporin use.   . Lactose Intolerance (Gi) Diarrhea and Nausea And Vomiting      Consultations:  GI, GYN, Cards   Other Procedures/Studies: Ct Head Wo Contrast  Result Date: 05/07/2018 CLINICAL DATA:  Encephalopathy. EXAM: CT HEAD WITHOUT CONTRAST TECHNIQUE: Contiguous axial images were obtained from the base of the skull through the vertex without intravenous contrast. COMPARISON:  CT scan of June 27, 2016. FINDINGS: Brain: Mild chronic ischemic white matter disease is noted. Old bilateral basal ganglia lacunar infarctions are noted. No  mass effect or midline shift is noted. Ventricular size is within normal limits. There is no evidence of mass lesion, hemorrhage or acute infarction. Vascular: No hyperdense vessel or unexpected calcification. Skull: Normal. Negative for fracture or focal lesion. Sinuses/Orbits: No acute finding. Other: None. IMPRESSION: Mild chronic ischemic white matter disease. No acute intracranial abnormality seen. Electronically Signed   By: Marijo Conception, M.D.   On: 05/07/2018 13:51   Dg Chest Port 1 View  Result Date: 05/07/2018 CLINICAL DATA:  Respiratory distress EXAM: PORTABLE CHEST 1 VIEW COMPARISON:  04/09/2017 FINDINGS: Shallow inspiration. Cardiac enlargement with mild vascular congestion. Perihilar infiltration suggesting mild edema. There is progression since previous study. Probable small pleural effusions bilaterally. No pneumothorax. Calcification of the aorta. Degenerative changes in the spine. IMPRESSION: Cardiac enlargement with pulmonary vascular congestion and mild perihilar edema, progressing since previous study. Small bilateral pleural effusions. Electronically Signed   By: Lucienne Capers M.D.   On: 05/07/2018 05:06   US Pelvic Complete With Transvaginal  Result Date: 05/03/2018 CLINICAL DATA:  Study is limited due to patient body habitus and the patient's inability to tolerate the transvaginal probe. EXAM: TRANSABDOMINAL AND TRANSVAGINAL ULTRASOUND OF PELVIS TECHNIQUE: Both transabdominal and transvaginal ultrasound examinations of the pelvis were performed. Transabdominal technique was performed for global imaging of the pelvis including uterus, ovaries, adnexal regions, and pelvic cul-de-sac. It was necessary to proceed with endovaginal exam following the transabdominal exam to visualize the endometrium and ovaries. COMPARISON:  None FINDINGS: Uterus Measurements: 12.3 x 8.0 x 8.4 cm. There appear to be 2 masses in the uterus measuring 3.5 x 2.3 x 3.2 cm and 4.3 x 3.9 x 4.3 cm. Endometrium  Thickness: 21.1 mm.  There is fluid in the endometrial canal. Right ovary Not visualized. Left ovary Not visualized. Other findings No abnormal free fluid. IMPRESSION: 1. The study is significantly limited due to the patient's body habitus and the patient's inability to tolerate the vaginal probe. 2. The endometrium appears to be significantly thickened measuring up to 21.1 mm. There is fluid in the canal. In the setting of post-menopausal bleeding, endometrial sampling is indicated to exclude carcinoma. If results are benign, sonohysterogram should be considered for focal lesion work-up. (Ref: Radiological Reasoning: Algorithmic Workup of Abnormal Vaginal Bleeding with Endovaginal Sonography and Sonohysterography. AJR 2008; 425:Z56-38) 3. Two apparent fibroids. Electronically Signed   By: Dorise Bullion III M.D   On: 05/03/2018 12:42     TODAY-DAY OF DISCHARGE:  Subjective:   [name] today  has no headache,no chest abdominal pain,no new weakness tingling or numbness, feels much better wants to go home today.   Objective:   Blood pressure 140/85, pulse (!) 115, temperature 98.6 F (37 C), temperature source Oral, resp. rate 20, height 5\' 8"  (1.727 m), weight 98.7 kg, SpO2 100 %.  Intake/Output Summary (Last 24 hours) at 05/09/2018 1016 Last data filed at 05/09/2018 0941 Gross per 24 hour  Intake 475 ml  Output 2700 ml  Net -2225 ml   Filed Weights   05/07/18 1156 05/08/18 0500 05/09/18 0500  Weight: 97.7 kg 95.1 kg 98.7 kg    Exam: Awake Alert, No new F.N deficits, Normal affect [place].AT,PERRAL Supple Neck,No JVD, No cervical lymphadenopathy appriciated.  Symmetrical Chest wall movement, Good air movement bilaterally, CTAB RRR,No Gallops,Rubs or new Murmurs, No Parasternal Heave +ve B.Sounds, Abd Soft, Non tender, No organomegaly appriciated, No rebound -guarding or rigidity. No Cyanosis, Clubbing or edema, No new Rash or bruise   PERTINENT RADIOLOGIC STUDIES: Ct Head Wo  Contrast  Result Date: 05/07/2018 CLINICAL DATA:  Encephalopathy. EXAM: CT HEAD WITHOUT CONTRAST TECHNIQUE: Contiguous axial images were obtained from the base of the skull through the vertex without intravenous contrast. COMPARISON:  CT scan of June 27, 2016. FINDINGS: Brain: Mild chronic ischemic white matter disease is noted. Old bilateral basal ganglia lacunar infarctions are noted. No mass effect or midline shift is noted. Ventricular size is within normal limits. There is no evidence of mass lesion, hemorrhage or acute infarction. Vascular: No hyperdense vessel or unexpected calcification. Skull: Normal. Negative for fracture or focal lesion. Sinuses/Orbits: No acute finding. Other: None. IMPRESSION: Mild chronic ischemic white matter disease. No acute intracranial abnormality seen. Electronically Signed   By: Marijo Conception, M.D.   On: 05/07/2018 13:51   Dg Chest Port 1 View  Result Date: 05/07/2018 CLINICAL DATA:  Respiratory distress EXAM: PORTABLE CHEST 1 VIEW COMPARISON:  04/09/2017 FINDINGS: Shallow inspiration. Cardiac enlargement with mild vascular congestion. Perihilar infiltration suggesting mild edema. There is progression since previous study. Probable small pleural effusions bilaterally. No pneumothorax. Calcification of the aorta. Degenerative changes in the spine. IMPRESSION: Cardiac enlargement with pulmonary vascular congestion and mild perihilar edema, progressing since previous study. Small bilateral pleural effusions. Electronically Signed   By: Lucienne Capers M.D.   On: 05/07/2018 05:06   US Pelvic Complete With Transvaginal  Result Date: 05/03/2018 CLINICAL DATA:  Study is limited due to patient body habitus and the patient's inability to tolerate the transvaginal probe. EXAM: TRANSABDOMINAL AND TRANSVAGINAL ULTRASOUND OF PELVIS TECHNIQUE: Both transabdominal and transvaginal ultrasound examinations of the pelvis were performed. Transabdominal technique was performed for  global imaging of the pelvis including uterus, ovaries, adnexal regions, and pelvic cul-de-sac. It was necessary to proceed with endovaginal exam following the transabdominal exam to visualize the endometrium and ovaries. COMPARISON:  None FINDINGS: Uterus Measurements: 12.3 x 8.0 x 8.4 cm. There appear to be 2 masses in the uterus measuring 3.5 x 2.3 x 3.2 cm and 4.3 x 3.9 x 4.3 cm. Endometrium Thickness: 21.1 mm.  There is fluid in the endometrial canal. Right ovary Not visualized. Left ovary Not visualized. Other findings No abnormal free fluid. IMPRESSION: 1. The study is significantly limited due to the patient's body habitus and the patient's inability to tolerate the vaginal probe. 2. The endometrium appears to be significantly thickened measuring up to 21.1 mm. There is fluid in the canal. In the setting of post-menopausal bleeding, endometrial sampling is indicated to exclude carcinoma.  If results are benign, sonohysterogram should be considered for focal lesion work-up. (Ref: Radiological Reasoning: Algorithmic Workup of Abnormal Vaginal Bleeding with Endovaginal Sonography and Sonohysterography. AJR 2008; 536:U44-03) 3. Two apparent fibroids. Electronically Signed   By: Dorise Bullion III M.D   On: 05/03/2018 12:42     PERTINENT LAB RESULTS: CBC: Recent Labs    05/07/18 0952 05/08/18 0502  WBC 13.6* 10.6*  HGB 10.3* 9.8*  HCT 33.5* 31.1*  PLT 237 220   CMET CMP     Component Value Date/Time   NA 140 05/08/2018 0502   NA 141 04/09/2017   K 3.6 05/08/2018 0502   CL 108 05/08/2018 0502   CO2 22 05/08/2018 0502   GLUCOSE 127 (H) 05/08/2018 0502   BUN 22 05/08/2018 0502   BUN 37 (A) 04/09/2017   CREATININE 1.57 (H) 05/08/2018 0502   CALCIUM 9.0 05/08/2018 0502   PROT 6.7 05/03/2018 0420   ALBUMIN 3.1 (L) 05/03/2018 0420   AST 22 05/03/2018 0420   ALT <5 05/03/2018 0420   ALKPHOS 114 05/03/2018 0420   BILITOT 0.7 05/03/2018 0420   GFRNONAA 34 (L) 05/08/2018 0502   GFRAA 39  (L) 05/08/2018 0502    GFR Estimated Creatinine Clearance: 43.9 mL/min (A) (by C-G formula based on SCr of 1.57 mg/dL (H)). No results for input(s): LIPASE, AMYLASE in the last 72 hours. Recent Labs    05/07/18 0952 05/07/18 1444 05/07/18 2119  TROPONINI 0.09* 0.15* 0.10*   Invalid input(s): POCBNP No results for input(s): DDIMER in the last 72 hours. No results for input(s): HGBA1C in the last 72 hours. No results for input(s): CHOL, HDL, LDLCALC, TRIG, CHOLHDL, LDLDIRECT in the last 72 hours. No results for input(s): TSH, T4TOTAL, T3FREE, THYROIDAB in the last 72 hours.  Invalid input(s): FREET3 No results for input(s): VITAMINB12, FOLATE, FERRITIN, TIBC, IRON, RETICCTPCT in the last 72 hours. Coags: No results for input(s): INR in the last 72 hours.  Invalid input(s): PT Microbiology: Recent Results (from the past 240 hour(s))  Urine culture     Status: Abnormal   Collection Time: 05/02/18  6:24 AM  Result Value Ref Range Status   Specimen Description URINE, CATHETERIZED  Final   Special Requests   Final    NONE Performed at [facility], [address], [place], [place] [address]    Culture MULTIPLE SPECIES PRESENT, SUGGEST RECOLLECTION (A)  Final   Report Status 05/03/2018 FINAL  Final  Culture, blood (Routine X 2) w Reflex to ID Panel     Status: None (Preliminary result)   Collection Time: 05/05/18  4:20 PM  Result Value Ref Range Status   Specimen Description BLOOD LEFT HAND  Final   Special Requests   Final    BOTTLES DRAWN AEROBIC ONLY Blood Culture results may not be optimal due to an inadequate volume of blood received in culture bottles   Culture   Final    NO GROWTH 3 DAYS Performed at [facility], [place] [address] [place], [place] [address]    Report Status PENDING  Incomplete  Culture, blood (Routine X 2) w Reflex to ID Panel     Status: None (Preliminary result)   Collection Time: 05/05/18  4:24 PM  Result Value Ref Range Status    Specimen Description BLOOD LEFT HAND  Final   Special Requests   Final    BOTTLES DRAWN AEROBIC ONLY Blood Culture results may not be optimal due to an inadequate volume of  blood received in culture bottles   Culture   Final    NO GROWTH 3 DAYS Performed at [facility], [place] [address] [place], [place] [address]    Report Status PENDING  Incomplete    FURTHER DISCHARGE INSTRUCTIONS:  Get Medicines reviewed and adjusted: Please take all your medications with you for your next visit with your Primary MD  Laboratory/radiological data: Please request your Primary MD to go over all hospital tests and procedure/radiological results at the follow up, please ask your Primary MD to get all Hospital records sent to his/her office.  In some cases, they will be blood work, cultures and biopsy results pending at the time of your discharge. Please request that your primary care M.D. goes through all the records of your hospital data and follows up on these results.  Also Note the following: If you experience worsening of your admission symptoms, develop shortness of breath, life threatening emergency, suicidal or homicidal thoughts you must seek medical attention immediately by calling 911 or calling your MD immediately  if symptoms less severe.  You must read complete instructions/literature along with all the possible adverse reactions/side effects for all the Medicines you take and that have been prescribed to you. Take any new Medicines after you have completely understood and accpet all the possible adverse reactions/side effects.   Do not drive when taking Pain medications or sleeping medications (Benzodaizepines)  Do not take more than prescribed Pain, Sleep and Anxiety Medications. It is not advisable to combine anxiety,sleep and pain medications without talking with your primary care practitioner  Special Instructions: If you have smoked or chewed Tobacco  in the last 2 yrs please stop  smoking, stop any regular Alcohol  and or any Recreational drug use.  Wear Seat belts while driving.  Please note: You were cared for by a hospitalist during your hospital stay. Once you are discharged, your primary care physician will handle any further medical issues. Please note that NO REFILLS for any discharge medications will be authorized once you are discharged, as it is imperative that you return to your primary care physician (or establish a relationship with a primary care physician if you do not have one) for your post hospital discharge needs so that they can reassess your need for medications and monitor your lab values.  Total Time spent coordinating discharge including counseling, education and face to face time equals 45 minutes.  SignedOren Binet 05/09/2018 10:16 AM

## 2018-05-10 LAB — CULTURE, BLOOD (ROUTINE X 2)
CULTURE: NO GROWTH
Culture: NO GROWTH

## 2018-05-14 ENCOUNTER — Encounter: Payer: Medicare Other | Admitting: Obstetrics and Gynecology

## 2018-05-15 ENCOUNTER — Encounter: Payer: Medicare Other | Admitting: Obstetrics and Gynecology

## 2018-05-15 ENCOUNTER — Emergency Department (HOSPITAL_COMMUNITY): Payer: Medicare Other

## 2018-05-15 ENCOUNTER — Emergency Department (HOSPITAL_COMMUNITY)
Admission: EM | Admit: 2018-05-15 | Discharge: 2018-05-16 | Disposition: A | Discharge status: Home / Self Care | Payer: Medicare Other | Attending: Emergency Medicine | Admitting: Emergency Medicine

## 2018-05-15 DIAGNOSIS — R0602 Shortness of breath: Secondary | ICD-10-CM | POA: Diagnosis not present

## 2018-05-15 DIAGNOSIS — I5032 Chronic diastolic (congestive) heart failure: Secondary | ICD-10-CM | POA: Diagnosis not present

## 2018-05-15 DIAGNOSIS — D649 Anemia, unspecified: Secondary | ICD-10-CM | POA: Insufficient documentation

## 2018-05-15 DIAGNOSIS — Z87891 Personal history of nicotine dependence: Secondary | ICD-10-CM | POA: Insufficient documentation

## 2018-05-15 DIAGNOSIS — I5033 Acute on chronic diastolic (congestive) heart failure: Secondary | ICD-10-CM | POA: Insufficient documentation

## 2018-05-15 DIAGNOSIS — I10 Essential (primary) hypertension: Secondary | ICD-10-CM | POA: Diagnosis not present

## 2018-05-15 DIAGNOSIS — N390 Urinary tract infection, site not specified: Secondary | ICD-10-CM | POA: Diagnosis not present

## 2018-05-15 DIAGNOSIS — N183 Chronic kidney disease, stage 3 (moderate): Secondary | ICD-10-CM | POA: Diagnosis not present

## 2018-05-15 DIAGNOSIS — I110 Hypertensive heart disease with heart failure: Secondary | ICD-10-CM | POA: Diagnosis not present

## 2018-05-15 DIAGNOSIS — E039 Hypothyroidism, unspecified: Secondary | ICD-10-CM | POA: Diagnosis not present

## 2018-05-15 DIAGNOSIS — E119 Type 2 diabetes mellitus without complications: Secondary | ICD-10-CM | POA: Insufficient documentation

## 2018-05-15 DIAGNOSIS — I130 Hypertensive heart and chronic kidney disease with heart failure and stage 1 through stage 4 chronic kidney disease, or unspecified chronic kidney disease: Secondary | ICD-10-CM | POA: Diagnosis not present

## 2018-05-15 DIAGNOSIS — Z79899 Other long term (current) drug therapy: Secondary | ICD-10-CM | POA: Insufficient documentation

## 2018-05-15 DIAGNOSIS — R5383 Other fatigue: Secondary | ICD-10-CM

## 2018-05-15 LAB — BASIC METABOLIC PANEL
ANION GAP: 10 (ref 5–15)
BUN: 35 mg/dL — AB (ref 8–23)
CALCIUM: 9.7 mg/dL (ref 8.9–10.3)
CO2: 23 mmol/L (ref 22–32)
CREATININE: 1.63 mg/dL — AB (ref 0.44–1.00)
Chloride: 107 mmol/L (ref 98–111)
GFR calc Af Amer: 37 mL/min — ABNORMAL LOW (ref >60)
GFR calc non Af Amer: 32 mL/min — ABNORMAL LOW (ref >60)
GLUCOSE: 101 mg/dL — AB (ref 70–99)
Potassium: 4.1 mmol/L (ref 3.5–5.1)
Sodium: 140 mmol/L (ref 135–145)

## 2018-05-15 LAB — CBC WITH DIFFERENTIAL/PLATELET
Abs Immature Granulocytes: 0 10*3/uL (ref 0.0–0.1)
BASOS PCT: 1 %
Basophils Absolute: 0.1 10*3/uL (ref 0.0–0.1)
EOS ABS: 0.2 10*3/uL (ref 0.0–0.7)
Eosinophils Relative: 2 %
HEMATOCRIT: 33 % — AB (ref 36.0–46.0)
Hemoglobin: 9.8 g/dL — ABNORMAL LOW (ref 12.0–15.0)
Immature Granulocytes: 0 %
LYMPHS ABS: 1.5 10*3/uL (ref 0.7–4.0)
Lymphocytes Relative: 13 %
MCH: 26 pg (ref 26.0–34.0)
MCHC: 29.7 g/dL — AB (ref 30.0–36.0)
MCV: 87.5 fL (ref 78.0–100.0)
MONO ABS: 0.9 10*3/uL (ref 0.1–1.0)
MONOS PCT: 8 %
Neutro Abs: 8.3 10*3/uL — ABNORMAL HIGH (ref 1.7–7.7)
Neutrophils Relative %: 76 %
PLATELETS: 334 10*3/uL (ref 150–400)
RBC: 3.77 MIL/uL — ABNORMAL LOW (ref 3.87–5.11)
RDW: 14.6 % (ref 11.5–15.5)
WBC: 11 10*3/uL — ABNORMAL HIGH (ref 4.0–10.5)

## 2018-05-15 LAB — URINALYSIS, ROUTINE W REFLEX MICROSCOPIC
BILIRUBIN URINE: NEGATIVE
Glucose, UA: NEGATIVE mg/dL
Ketones, ur: NEGATIVE mg/dL
NITRITE: NEGATIVE
Protein, ur: 30 mg/dL — AB
SPECIFIC GRAVITY, URINE: 1.012 (ref 1.005–1.030)
pH: 5 (ref 5.0–8.0)

## 2018-05-15 LAB — I-STAT TROPONIN, ED: TROPONIN I, POC: 0 ng/mL (ref 0.00–0.08)

## 2018-05-15 LAB — BRAIN NATRIURETIC PEPTIDE: B Natriuretic Peptide: 218.2 pg/mL — ABNORMAL HIGH (ref 0.0–100.0)

## 2018-05-15 MED ORDER — IPRATROPIUM BROMIDE 0.02 % IN SOLN
0.5000 mg | Freq: Once | RESPIRATORY_TRACT | Status: AC
  Administered 2018-05-15: 0.5 mg via RESPIRATORY_TRACT
  Filled 2018-05-15: qty 2.5

## 2018-05-15 MED ORDER — ALBUTEROL SULFATE (2.5 MG/3ML) 0.083% IN NEBU
5.0000 mg | INHALATION_SOLUTION | Freq: Once | RESPIRATORY_TRACT | Status: AC
  Administered 2018-05-15: 5 mg via RESPIRATORY_TRACT
  Filled 2018-05-15: qty 6

## 2018-05-15 MED ORDER — CIPROFLOXACIN HCL 500 MG PO TABS: 500.0000 mg | ORAL_TABLET | Freq: Two times a day (BID) | ORAL | 0 refills | Status: DC

## 2018-05-15 NOTE — ED Notes (Signed)
Pt resting. Blanket roll placed next to pt's head for support.

## 2018-05-15 NOTE — ED Provider Notes (Signed)
[facility] EMERGENCY DEPARTMENT Provider Note   CSN: [id number] Arrival date & time: 05/15/18  1153     History   Chief Complaint Chief Complaint  Patient presents with  . Shortness of Breath    HPI Angelica Beck is a 67 y.o. female with a PMHx of HLD, HTN, dCHF (EF 60-65% on 05/06/18), DM2, hyperthyroidism, asthma, CKD3, obesity hypoventilation syndrome, chronic respiratory failure on 2L O2 via [place], and other conditions listed below, who presents to the ED with complaints of shortness of breath since yesterday.  LEVEL 5 CAVEAT DUE TO DEMENTIA, much of her history is provided by her granddaughter who takes care of her.  Granddaughter states that over the weekend on Saturday (4 days ago) the patient was at a party and had a choking episode.  After that she was "okay" until the next day when she started sleeping more.  Yesterday she started complaining of shortness of breath however they gave her a DuoNeb and that seemed to help her, but today she again complained of shortness of breath and the breathing treatment did not help.  There have been no known aggravating factors to her knowledge.  Granddaughter states that she noticed that the patient has had a dry cough and been very fatigued lately.  She mentions that she was just in the hospital and that she has been compliant with the medications that they sent her home with, chart review reveals that she was just admitted for CHF exacerbation (among other issues) on 8/8-15/19, sent home with increased dose of Lasix (40mg  QD x1wk then back down to 20mg ).  Patient's granddaughter denies that she is had any fevers, worsening leg swelling, vomiting, diarrhea, constipation, changes in urination, or any other complaints/concerns at this time.  Granddaughter states pt is at her mental baseline.  Patient denies any chest pain, abdominal pain, or pain anywhere.  Her PCP is Dr. Delia Chimes, per the granddaughter.  The remainder of the HPI  and ROS are limited due to the patient's dementia.  The history is provided by the patient, medical records and a caregiver. The history is limited by the condition of the patient. No language interpreter was used.    Past Medical History:  Diagnosis Date  . Arthritis   . Asthma    09/07/16  . Diabetes mellitus, type 2 ([place])   . Diffuse goiter   . Hyperlipidemia   . Hypertension   . Hyperthyroidism   . Obesity hypoventilation syndrome ([place]) 06/20/2013  . Stroke ([place])   . Vascular dementia     Patient Active Problem List   Diagnosis Date Noted  . Acute respiratory failure with hypoxia ([place]) 05/07/2018  . Respiratory distress   . Postmenopausal bleeding 05/05/2018  . Abnormal echocardiogram   . Endometrial thickening on ultrasound 05/04/2018  . Bradycardia 05/04/2018  . Vaginal bleeding 05/03/2018  . Rectal bleeding 05/02/2018  . GI bleed 05/02/2018  . Dementia due to Parkinson's disease without behavioral disturbance ([place]) 05/02/2018  . Acute CHF (congestive heart failure) ([place]) 05/02/2018  . Renal insufficiency   . Acute on chronic diastolic CHF (congestive heart failure) ([place]) 03/23/2017  . Laryngeal stridor 03/23/2017  . Goiter diffuse 03/23/2017  . Dementia 03/20/2017  . SOB (shortness of breath)   . Hemoptysis 02/22/2017  . Stroke ([place])   . History of hemoptysis 02/16/2017  . Chronic bronchitis ([place]) 01/25/2017  . Acute bronchitis 12/27/2016  . CKD (chronic kidney disease), stage III ([place]) 12/27/2016  . PNA (pneumonia) 12/21/2016  .  Edema 11/02/2016  . Diastolic dysfunction 95/62/1308  . Osteoarthritis of both knees 04-19-202018  . At risk for aspiration 10/03/2016  . Normocytic normochromic anemia 09/07/2016  . Acute on chronic respiratory failure ([place])   . (HFpEF) heart failure with preserved ejection fraction ([place])   . Enteritis due to Clostridium difficile   . SBO (small bowel obstruction) ([place]) 08/10/2016  . Collapse of left lung   . S/P percutaneous  endoscopic gastrostomy (PEG) tube placement ([place])   . Tracheostomy status ([place])   . HCAP (healthcare-associated pneumonia) 07/16/2016  . Encephalopathy   . Palliative care encounter   . Pressure injury of skin 06/24/2016  . Acute respiratory failure ([place]) 06/21/2016  . Septic shock ([place]) 06/20/2016  . Chronic respiratory failure with hypoxia ([place]) 11/29/2015  . Essential hypertension   . Lactic acidosis 08/02/2015  . Acute encephalopathy 08/02/2015  . History of CVA (cerebrovascular accident) 08/02/2015  . Chronic systolic HF (heart failure) ([place]) 08/02/2015  . Cognitive impairment 08/02/2015  . AKI (acute kidney injury) ([place]) 02/08/2015  . Abnormality of gait 02/08/2015  . Hyperthyroidism 02/08/2015  . Type 2 DM with CKD and hypertension ([place]) 02/08/2015  . Hyperglycemia 06/28/2009  . HYPERLIPIDEMIA-MIXED 06/28/2009  . CAD, NATIVE VESSEL 06/28/2009  . DYSPNEA 06/28/2009    Past Surgical History:  Procedure Laterality Date  . IR GENERIC HISTORICAL  07/12/2016   IR US GUIDE VASC ACCESS RIGHT 07/12/2016 [name], MD [facility]  . IR GENERIC HISTORICAL  07/12/2016   IR FLUORO GUIDE CV LINE RIGHT 07/12/2016 [name], MD [facility]  . IR GENERIC HISTORICAL  07/17/2016   IR GASTROSTOMY TUBE MOD SED 07/17/2016 [name], MD [facility]  . IR GENERIC HISTORICAL  08/23/2016   IR REMOVAL TUN CV CATH W/O FL 08/23/2016 [name], MD [facility]  . IR GENERIC HISTORICAL  11/09/2016   IR GASTROSTOMY TUBE REMOVAL 11/09/2016 [name], PA-C [facility]  . TEE WITHOUT CARDIOVERSION N/A 05/06/2018   Procedure: TRANSESOPHAGEAL ECHOCARDIOGRAM (TEE);  Surgeon: Dorothy Spark, MD;  Location: [facility] ENDOSCOPY;  Service: Cardiovascular;  Laterality: N/A;  . TRACHEOSTOMY TUBE PLACEMENT N/A 07/14/2016   Procedure: TRACHEOSTOMY, THYROID ISTHMUSECTOMY;  Surgeon: [name], MD;  Location: [facility];  Service: ENT;  Laterality: N/A;  . VENTRAL HERNIA REPAIR       OB  History   None      Home Medications    Prior to Admission medications   Medication Sig Start Date End Date Taking? Authorizing Provider  acetaminophen (TYLENOL) 500 MG tablet Take 500 mg by mouth every 6 (six) hours as needed for mild pain.     [provider]  atorvastatin (LIPITOR) 40 MG tablet Take 40 mg by mouth every evening.     [provider]  b complex-vitamin c-folic acid (NEPHRO-VITE) 0.8 MG TABS tablet Take 1 tablet by mouth every morning.    [provider]  budesonide (PULMICORT) 0.5 MG/2ML nebulizer solution Take 0.5 mg by nebulization 2 (two) times daily.    [provider]  cholecalciferol (VITAMIN D) 1000 units tablet Take 1,000 Units by mouth daily.    [provider]  docusate sodium (COLACE) 100 MG capsule Take 100 mg by mouth daily.     [provider]  famotidine (PEPCID) 20 MG tablet Take 20 mg by mouth daily.     [provider]  furosemide (LASIX) 20 MG tablet Take 2 tablets (40 mg total) by mouth daily. Take 40 mg p.o. daily for  1 week, and then drop down to 20 mg daily. 05/09/18   Ghimire, Henreitta Leber, MD  hydrALAZINE (APRESOLINE) 25 MG tablet Take 1 tablet (25 mg total) by mouth every 8 (eight) hours. 05/09/18   Ghimire, Henreitta Leber, MD  insulin detemir (LEVEMIR) 100 UNIT/ML injection Inject 10 Units into the skin at bedtime.     [provider]  ipratropium-albuterol (DUONEB) 0.5-2.5 (3) MG/3ML SOLN Take 3 mLs by nebulization every 4 (four) hours as needed (for dyspnea).    [provider]  megestrol (MEGACE) 40 MG tablet Take 1 tablet (40 mg total) by mouth 2 (two) times daily. 05/09/18   Ghimire, Henreitta Leber, MD  Melatonin 3 MG TABS Take 3 mg by mouth at bedtime.     [provider]  memantine (NAMENDA) 10 MG tablet Take 10 mg by mouth 2 (two) times daily.     [provider]  methimazole (TAPAZOLE) 10 MG tablet Take 10 mg by mouth daily.     [provider]    metoprolol tartrate (LOPRESSOR) 25 MG tablet Take 0.5 tablets (12.5 mg total) by mouth 2 (two) times daily. 05/09/18   Ghimire, Henreitta Leber, MD  ondansetron (ZOFRAN) 4 MG tablet Take 4 mg by mouth every 8 (eight) hours as needed for nausea or vomiting.    [provider]  pantoprazole (PROTONIX) 40 MG tablet Take 1 tablet (40 mg total) by mouth daily at 6 (six) AM. 05/10/18   Ghimire, Henreitta Leber, MD  polyethylene glycol (MIRALAX / GLYCOLAX) packet Take 17 g by mouth daily. 05/09/18   Ghimire, Henreitta Leber, MD  potassium chloride SA (K-DUR,KLOR-CON) 20 MEQ tablet Take 20 mEq by mouth every Monday, Wednesday, and Friday.    [provider]  senna-docusate (SENOKOT-S) 8.6-50 MG tablet Take 1 tablet by mouth at bedtime. 05/09/18   Ghimire, Henreitta Leber, MD    Family History Family History  Problem Relation Age of Onset  . Diabetes Mother   . Cancer Neg Hx   . Heart disease Neg Hx   . Stroke Neg Hx     Social History Social History   Tobacco Use  . Smoking status: Former Smoker    Packs/day: 0.25    Years: 30.00    Pack years: 7.50    Types: Cigarettes    Last attempt to quit: 09/25/2002    Years since quitting: 15.6  . Smokeless tobacco: Never Used  Substance Use Topics  . Alcohol use: No  . Drug use: No     Allergies   Penicillins and Lactose intolerance (gi)   Review of Systems Review of Systems  Unable to perform ROS: Dementia  Constitutional: Positive for fatigue. Negative for fever.  Respiratory: Positive for cough and shortness of breath.   Cardiovascular: Negative for chest pain and leg swelling (nothing worse/new).  Gastrointestinal: Negative for abdominal pain, constipation, diarrhea and vomiting.  Genitourinary: Negative for decreased urine volume and frequency.       No changes in urination  Allergic/Immunologic: Positive for immunocompromised state (DM2).   LEVEL 5 CAVEAT DUE TO DEMENTIA  Physical Exam Updated Vital Signs BP (!) 158/83 (BP Location:  Left Arm)   Pulse 77   Temp 99.1 F (37.3 C) (Oral)   Resp 18   SpO2 100%   Physical Exam  Constitutional: Vital signs are normal. She appears well-nourished.  Non-toxic appearance. No distress.  Afebrile, nontoxic, NAD, appears much older than stated age, chronically ill appearing  HENT:  Head: Normocephalic and  atraumatic.  Mouth/Throat: Oropharynx is clear and moist and mucous membranes are normal.  Eyes: Conjunctivae and EOM are normal. Right eye exhibits no discharge. Left eye exhibits no discharge.  Neck: Normal range of motion. Neck supple.  Cardiovascular: Normal rate, regular rhythm and intact distal pulses. Exam reveals no gallop and no friction rub.  Murmur heard. Systolic murmur at upper LSB. RRR, nl s1/s2, no rubs or gallops appreciated, distal pulses intact, 2+ RLE and 1+ LLE pedal edema   Pulmonary/Chest: Effort normal. No respiratory distress. She has decreased breath sounds. She has no wheezes. She has no rhonchi. She has no rales.  Somewhat snoring type sound with inspiration therefore difficult to auscultate lungs; diminished lung sounds in b/l bases, but otherwise no wheezing/rhonchi/rales appreciated but again this is difficult to discern due to the sounds she makes from the upper airway during inspiration; no w/r/r appreciated during expiration. No hypoxia or increased WOB, SpO2 100% on 2L via [place]   Abdominal: Soft. Normal appearance and bowel sounds are normal. She exhibits no distension. There is no tenderness. There is no rigidity, no rebound, no guarding, no CVA tenderness, no tenderness at McBurney's point and negative Murphy's sign.  Musculoskeletal: Normal range of motion.  Chronically bedbound, moves both upper extremities without difficulty but chooses to keep her lower extremities crossed (left over right), which granddaughter states is how she always lays.   Neurological: She is alert. No sensory deficit.  Baseline per granddaughter. Pt nods yes/no to  questions, doesn't really answer questions much but granddaughter states this is baseline  Skin: Skin is warm, dry and intact. No rash noted.  Very dry skin at her feet, flaking  Psychiatric: She has a normal mood and affect.  Nursing note and vitals reviewed.    ED Treatments / Results  Labs (all labs ordered are listed, but only abnormal results are displayed) Labs Reviewed  CBC WITH DIFFERENTIAL/PLATELET - Abnormal; Notable for the following components:      Result Value   WBC 11.0 (*)    RBC 3.77 (*)    Hemoglobin 9.8 (*)    HCT 33.0 (*)    MCHC 29.7 (*)    Neutro Abs 8.3 (*)    All other components within normal limits  BASIC METABOLIC PANEL - Abnormal; Notable for the following components:   Glucose, Bld 101 (*)    BUN 35 (*)    Creatinine, Ser 1.63 (*)    GFR calc non Af Amer 32 (*)    GFR calc Af Amer 37 (*)    All other components within normal limits  BRAIN NATRIURETIC PEPTIDE - Abnormal; Notable for the following components:   B Natriuretic Peptide 218.2 (*)    All other components within normal limits  URINALYSIS, ROUTINE W REFLEX MICROSCOPIC - Abnormal; Notable for the following components:   APPearance HAZY (*)    Hgb urine dipstick SMALL (*)    Protein, ur 30 (*)    Leukocytes, UA LARGE (*)    WBC, UA >50 (*)    Bacteria, UA MANY (*)    All other components within normal limits  URINE CULTURE  I-STAT TROPONIN, ED    EKG EKG Interpretation  Date/Time:  Wednesday May 15 2018 12:09:33 EDT Ventricular Rate:  75 PR Interval:    QRS Duration: 92 QT Interval:  392 QTC Calculation: 438 R Axis:   -12 Text Interpretation:  Sinus rhythm Anteroseptal infarct, age indeterminate Interpretation limited secondary to artifact otherwise similar to May 07 2018 Confirmed by Sherwood Gambler [phone]) on 05/15/2018 12:30:26 PM   Radiology Dg Chest 2 View  Result Date: 05/15/2018 CLINICAL DATA:  Shortness of breath for 1 day. EXAM: CHEST - 2 VIEW COMPARISON:   Single-view of the chest 05/07/2018. PA and lateral chest 04/09/2017. FINDINGS: There is cardiomegaly and vascular congestion. No consolidative process, pneumothorax or effusion. Aortic atherosclerosis noted. Lung volumes are low. No acute or focal bony abnormality. IMPRESSION: No acute disease. Cardiomegaly and vascular congestion. Electronically Signed   By: Inge Rise M.D.   On: 05/15/2018 13:46     Echo 05/06/18: Study Conclusions - Left ventricle: Systolic function was normal. The estimated   ejection fraction was in the range of 60% to 65%. Wall motion was   normal; there were no regional wall motion abnormalities. - Left atrium: The atrium was dilated. No evidence of thrombus in   the atrial cavity or appendage. No evidence of thrombus in the   atrial cavity or appendage. - Right atrium: No evidence of thrombus in the atrial cavity or   appendage. - Pericardium, extracardiac: There was no pericardial effusion.  Impressions: - Aortic valve is moderately thickened and calcified. Left coronary   leaflet is severely calcified with a mass like calcification   measuring 14 x 7 mm with a very small mobile portion. This   doesn&'t look like vegetation but rather like a valve   degeneration. Transaortic velocities are minimally elevated. This   is not significantly changed from the prior study on 06/27/2016.   Procedures Procedures (including critical care time)  Medications Ordered in ED Medications  albuterol (PROVENTIL) (2.5 MG/3ML) 0.083% nebulizer solution 5 mg (5 mg Nebulization Given 05/15/18 1242)  ipratropium (ATROVENT) nebulizer solution 0.5 mg (0.5 mg Nebulization Given 05/15/18 1242)     Initial Impression / Assessment and Plan / ED Course  I have reviewed the triage vital signs and the nursing notes.  Pertinent labs & imaging results that were available during my care of the patient were reviewed by me and considered in my medical decision making (see chart for  details).     67 y.o. female here with SOB x1 day, apparently had a possible aspiration event a few days ago and then since then has been sleeping more than usual. Was recently discharged from hospital after CHF exacerbation. On exam, appears much older than stated age, RLE with 2+ edema and LLE with 1+ edema, snoring type inspiration makes it difficult to auscultate her lungs, somewhat diminished in the bases bilaterally. No tachycardia or hypoxia on her home O2 level. Could be aspiration PNA vs CHF exacerbation. EKG without acute ischemic findings. Will get labs and CXR, give duoneb, then reassess shortly. Discussed case with my attending Dr. Regenia Skeeter who agrees with plan.   6:20 PM Several delays in getting her labs going but they have mostly resulted and show: CBC w/diff with chronic stable anemia and a marginally elevated WBC 11.0 similar to recent admission. BMP with fairly stable kidney function (BUN 35 slightly higher than usual but Cr 1.63 fairly close to baseline), and otherwise remainder of BMP WNL. BNP elevated at 218.2. Trop neg. U/A not yet done but apparently sent down so should result soon. CXR with no acute disease, cardiomegaly and vascular congestion noted; no consolidative process identified. Pt feeling better, lung sounds improved after duoneb. No longer having SOB, and no longer diminished in lower fields. I suspect some component of CHF however doesn't appear decompensated, and she is currently on  a higher dose of lasix; doubt need for admission for this right now given reassuring echo recently, and reassuring evaluation today. Will await U/A and reassess.   7:39 PM U/A with 6-10 squamous so some level of contamination, however large leuks, >50 WBCs, and many bacteria indicative of UTI. Will send for UCx, and treat empirically; daughter [name] states she thinks she's had cephalexin before but she's not sure, has high allergy to PCN, knows she's gotten cipro before without issue so will  use cipro for this UTI. This UTI could be why she's been sleeping more than usual lately. As for her SOB, I suspect mild CHF but doubt need for admission at this time. Advised continuation of the increased lasix dosing as previously instructed, use of OTC remedies for cough, and her home breathing tx. Advised close PCP f/up in 3-5 days for recheck of symptoms. I explained the diagnosis and have given explicit precautions to return to the ER including for any other new or worsening symptoms. The pt's family/caregivers understand and accept the medical plan as it's been dictated and I have answered their questions. Discharge instructions concerning home care and prescriptions have been given. The patient is STABLE and is discharged to home in good condition.    Final Clinical Impressions(s) / ED Diagnoses   Final diagnoses:  SOB (shortness of breath)  Chronic diastolic congestive heart failure (HCC)  Chronic anemia  Acute lower UTI  Fatigue, unspecified type    ED Discharge Orders         Ordered    ciprofloxacin (CIPRO) 500 MG tablet  2 times daily     05/15/18 [address] [place], [place] 05/15/18 1939    Sherwood Gambler, MD 05/15/18 2135

## 2018-05-15 NOTE — Discharge Instructions (Addendum)
Continue your dose of lasix as prescribed by the hospital when you were discharged last week. Continue using your home breathing treatments as directed as needed for shortness of breath. You can use over the counter regular mucinex or robitussin as needed for cough. You have a urinary tract infection, take the antibiotic as directed until completed. Follow up with your regular doctor in 3-5 days for recheck of symptoms and ongoing evaluation of your symptoms. Return to the ER for emergent changes or worsening symptoms.

## 2018-05-15 NOTE — ED Notes (Signed)
Got patient cleaned up on the [place]wick patient is resting with family at bedside and call bell in reach

## 2018-05-15 NOTE — ED Notes (Signed)
Unable to collect blood phlebotomy notified for assistance.

## 2018-05-15 NOTE — ED Notes (Signed)
Got patient undress into a gown on the monitor did ekg shown to er doctor patient is resting with call bell in reach

## 2018-05-15 NOTE — ED Triage Notes (Signed)
Pt arrived via [facility] from home c/o SOB. Pt has hx of such and is on 2Lpm [place] chronically. Pt has hx of dementia but is currently at her baseline according to EMS. Family en route to ED is pt is a poor historian. EMS v/s 168/106, hr 76, cbg 101. Pt is alert.

## 2018-05-15 NOTE — ED Notes (Signed)
Respiratory at bedside.

## 2018-05-16 DIAGNOSIS — N183 Chronic kidney disease, stage 3 (moderate): Secondary | ICD-10-CM | POA: Diagnosis not present

## 2018-05-16 DIAGNOSIS — N390 Urinary tract infection, site not specified: Secondary | ICD-10-CM | POA: Diagnosis not present

## 2018-05-16 DIAGNOSIS — J449 Chronic obstructive pulmonary disease, unspecified: Secondary | ICD-10-CM | POA: Diagnosis not present

## 2018-05-16 DIAGNOSIS — Z743 Need for continuous supervision: Secondary | ICD-10-CM | POA: Diagnosis not present

## 2018-05-16 DIAGNOSIS — Z8719 Personal history of other diseases of the digestive system: Secondary | ICD-10-CM | POA: Diagnosis not present

## 2018-05-16 DIAGNOSIS — R279 Unspecified lack of coordination: Secondary | ICD-10-CM | POA: Diagnosis not present

## 2018-05-16 DIAGNOSIS — E1165 Type 2 diabetes mellitus with hyperglycemia: Secondary | ICD-10-CM | POA: Diagnosis not present

## 2018-05-16 DIAGNOSIS — I5032 Chronic diastolic (congestive) heart failure: Secondary | ICD-10-CM | POA: Diagnosis not present

## 2018-05-16 DIAGNOSIS — R29898 Other symptoms and signs involving the musculoskeletal system: Secondary | ICD-10-CM | POA: Diagnosis not present

## 2018-05-16 NOTE — ED Notes (Signed)
[facility] contacted again for estimated time of departure for patient

## 2018-05-16 NOTE — ED Notes (Addendum)
PT states understanding of care given, follow up care, and medication prescribed. Pt going home by [facility] at this time.

## 2018-05-17 LAB — URINE CULTURE

## 2018-05-18 ENCOUNTER — Telehealth: Payer: Self-pay

## 2018-05-18 NOTE — Telephone Encounter (Signed)
Post ED Visit - Positive Culture Follow-up  Culture report reviewed by antimicrobial stewardship pharmacist:  []  Elenor Quinones, Pharm.D. []  Heide Guile, Pharm.D., BCPS AQ-ID []  Parks Neptune, Pharm.D., BCPS []  Alycia Rossetti, Pharm.D., BCPS []  [place], Pharm.D., BCPS, AAHIVP []  Legrand Como, Pharm.D., BCPS, AAHIVP []  Salome Arnt, PharmD, BCPS []  Johnnette Gourd, PharmD, BCPS []  Hughes Better, PharmD, BCPS [x]  Leeroy Cha, PharmD  Positive urine culture Treated with Ciprofloxacin, organism sensitive to the same and no further patient follow-up is required at this time.  Genia Del 05/18/2018, 9:41 AM

## 2018-05-24 DIAGNOSIS — M1990 Unspecified osteoarthritis, unspecified site: Secondary | ICD-10-CM | POA: Diagnosis not present

## 2018-05-24 DIAGNOSIS — Z7951 Long term (current) use of inhaled steroids: Secondary | ICD-10-CM | POA: Diagnosis not present

## 2018-05-24 DIAGNOSIS — F0150 Vascular dementia without behavioral disturbance: Secondary | ICD-10-CM | POA: Diagnosis not present

## 2018-05-24 DIAGNOSIS — J9611 Chronic respiratory failure with hypoxia: Secondary | ICD-10-CM | POA: Diagnosis not present

## 2018-05-24 DIAGNOSIS — I5022 Chronic systolic (congestive) heart failure: Secondary | ICD-10-CM | POA: Diagnosis not present

## 2018-05-24 DIAGNOSIS — F0280 Dementia in other diseases classified elsewhere without behavioral disturbance: Secondary | ICD-10-CM | POA: Diagnosis not present

## 2018-05-24 DIAGNOSIS — Z794 Long term (current) use of insulin: Secondary | ICD-10-CM | POA: Diagnosis not present

## 2018-05-24 DIAGNOSIS — Z79899 Other long term (current) drug therapy: Secondary | ICD-10-CM | POA: Diagnosis not present

## 2018-05-24 DIAGNOSIS — J45909 Unspecified asthma, uncomplicated: Secondary | ICD-10-CM | POA: Diagnosis not present

## 2018-05-24 DIAGNOSIS — I5033 Acute on chronic diastolic (congestive) heart failure: Secondary | ICD-10-CM | POA: Diagnosis not present

## 2018-05-24 DIAGNOSIS — I69398 Other sequelae of cerebral infarction: Secondary | ICD-10-CM | POA: Diagnosis not present

## 2018-05-24 DIAGNOSIS — I130 Hypertensive heart and chronic kidney disease with heart failure and stage 1 through stage 4 chronic kidney disease, or unspecified chronic kidney disease: Secondary | ICD-10-CM | POA: Diagnosis not present

## 2018-05-24 DIAGNOSIS — Z87891 Personal history of nicotine dependence: Secondary | ICD-10-CM | POA: Diagnosis not present

## 2018-05-24 DIAGNOSIS — N183 Chronic kidney disease, stage 3 (moderate): Secondary | ICD-10-CM | POA: Diagnosis not present

## 2018-05-24 DIAGNOSIS — E1122 Type 2 diabetes mellitus with diabetic chronic kidney disease: Secondary | ICD-10-CM | POA: Diagnosis not present

## 2018-05-24 DIAGNOSIS — G20 Parkinson's disease: Secondary | ICD-10-CM | POA: Diagnosis not present

## 2018-05-31 DIAGNOSIS — Z23 Encounter for immunization: Secondary | ICD-10-CM | POA: Diagnosis not present

## 2018-06-05 DIAGNOSIS — I5033 Acute on chronic diastolic (congestive) heart failure: Secondary | ICD-10-CM | POA: Diagnosis not present

## 2018-06-05 DIAGNOSIS — I130 Hypertensive heart and chronic kidney disease with heart failure and stage 1 through stage 4 chronic kidney disease, or unspecified chronic kidney disease: Secondary | ICD-10-CM | POA: Diagnosis not present

## 2018-06-05 DIAGNOSIS — F0280 Dementia in other diseases classified elsewhere without behavioral disturbance: Secondary | ICD-10-CM | POA: Diagnosis not present

## 2018-06-05 DIAGNOSIS — E1122 Type 2 diabetes mellitus with diabetic chronic kidney disease: Secondary | ICD-10-CM | POA: Diagnosis not present

## 2018-06-05 DIAGNOSIS — I5022 Chronic systolic (congestive) heart failure: Secondary | ICD-10-CM | POA: Diagnosis not present

## 2018-06-05 DIAGNOSIS — G20 Parkinson's disease: Secondary | ICD-10-CM | POA: Diagnosis not present

## 2018-06-07 DIAGNOSIS — E1122 Type 2 diabetes mellitus with diabetic chronic kidney disease: Secondary | ICD-10-CM | POA: Diagnosis not present

## 2018-06-07 DIAGNOSIS — I130 Hypertensive heart and chronic kidney disease with heart failure and stage 1 through stage 4 chronic kidney disease, or unspecified chronic kidney disease: Secondary | ICD-10-CM | POA: Diagnosis not present

## 2018-06-07 DIAGNOSIS — I5022 Chronic systolic (congestive) heart failure: Secondary | ICD-10-CM | POA: Diagnosis not present

## 2018-06-07 DIAGNOSIS — I5033 Acute on chronic diastolic (congestive) heart failure: Secondary | ICD-10-CM | POA: Diagnosis not present

## 2018-06-07 DIAGNOSIS — F0280 Dementia in other diseases classified elsewhere without behavioral disturbance: Secondary | ICD-10-CM | POA: Diagnosis not present

## 2018-06-07 DIAGNOSIS — G20 Parkinson's disease: Secondary | ICD-10-CM | POA: Diagnosis not present

## 2018-06-08 DIAGNOSIS — E1122 Type 2 diabetes mellitus with diabetic chronic kidney disease: Secondary | ICD-10-CM | POA: Diagnosis not present

## 2018-06-08 DIAGNOSIS — I5033 Acute on chronic diastolic (congestive) heart failure: Secondary | ICD-10-CM | POA: Diagnosis not present

## 2018-06-08 DIAGNOSIS — G20 Parkinson's disease: Secondary | ICD-10-CM | POA: Diagnosis not present

## 2018-06-08 DIAGNOSIS — F0280 Dementia in other diseases classified elsewhere without behavioral disturbance: Secondary | ICD-10-CM | POA: Diagnosis not present

## 2018-06-08 DIAGNOSIS — I130 Hypertensive heart and chronic kidney disease with heart failure and stage 1 through stage 4 chronic kidney disease, or unspecified chronic kidney disease: Secondary | ICD-10-CM | POA: Diagnosis not present

## 2018-06-08 DIAGNOSIS — I5022 Chronic systolic (congestive) heart failure: Secondary | ICD-10-CM | POA: Diagnosis not present

## 2018-06-10 DIAGNOSIS — F0280 Dementia in other diseases classified elsewhere without behavioral disturbance: Secondary | ICD-10-CM | POA: Diagnosis not present

## 2018-06-10 DIAGNOSIS — I130 Hypertensive heart and chronic kidney disease with heart failure and stage 1 through stage 4 chronic kidney disease, or unspecified chronic kidney disease: Secondary | ICD-10-CM | POA: Diagnosis not present

## 2018-06-10 DIAGNOSIS — I5033 Acute on chronic diastolic (congestive) heart failure: Secondary | ICD-10-CM | POA: Diagnosis not present

## 2018-06-10 DIAGNOSIS — I5022 Chronic systolic (congestive) heart failure: Secondary | ICD-10-CM | POA: Diagnosis not present

## 2018-06-10 DIAGNOSIS — E1122 Type 2 diabetes mellitus with diabetic chronic kidney disease: Secondary | ICD-10-CM | POA: Diagnosis not present

## 2018-06-10 DIAGNOSIS — G20 Parkinson's disease: Secondary | ICD-10-CM | POA: Diagnosis not present

## 2018-06-14 DIAGNOSIS — I5033 Acute on chronic diastolic (congestive) heart failure: Secondary | ICD-10-CM | POA: Diagnosis not present

## 2018-06-14 DIAGNOSIS — F0280 Dementia in other diseases classified elsewhere without behavioral disturbance: Secondary | ICD-10-CM | POA: Diagnosis not present

## 2018-06-14 DIAGNOSIS — I5022 Chronic systolic (congestive) heart failure: Secondary | ICD-10-CM | POA: Diagnosis not present

## 2018-06-14 DIAGNOSIS — G20 Parkinson's disease: Secondary | ICD-10-CM | POA: Diagnosis not present

## 2018-06-14 DIAGNOSIS — E1165 Type 2 diabetes mellitus with hyperglycemia: Secondary | ICD-10-CM | POA: Diagnosis not present

## 2018-06-14 DIAGNOSIS — I130 Hypertensive heart and chronic kidney disease with heart failure and stage 1 through stage 4 chronic kidney disease, or unspecified chronic kidney disease: Secondary | ICD-10-CM | POA: Diagnosis not present

## 2018-06-14 DIAGNOSIS — E1122 Type 2 diabetes mellitus with diabetic chronic kidney disease: Secondary | ICD-10-CM | POA: Diagnosis not present

## 2018-06-17 DIAGNOSIS — E1122 Type 2 diabetes mellitus with diabetic chronic kidney disease: Secondary | ICD-10-CM | POA: Diagnosis not present

## 2018-06-17 DIAGNOSIS — I5033 Acute on chronic diastolic (congestive) heart failure: Secondary | ICD-10-CM | POA: Diagnosis not present

## 2018-06-17 DIAGNOSIS — G20 Parkinson's disease: Secondary | ICD-10-CM | POA: Diagnosis not present

## 2018-06-17 DIAGNOSIS — I130 Hypertensive heart and chronic kidney disease with heart failure and stage 1 through stage 4 chronic kidney disease, or unspecified chronic kidney disease: Secondary | ICD-10-CM | POA: Diagnosis not present

## 2018-06-17 DIAGNOSIS — I5022 Chronic systolic (congestive) heart failure: Secondary | ICD-10-CM | POA: Diagnosis not present

## 2018-06-17 DIAGNOSIS — F0280 Dementia in other diseases classified elsewhere without behavioral disturbance: Secondary | ICD-10-CM | POA: Diagnosis not present

## 2018-06-20 DIAGNOSIS — I130 Hypertensive heart and chronic kidney disease with heart failure and stage 1 through stage 4 chronic kidney disease, or unspecified chronic kidney disease: Secondary | ICD-10-CM | POA: Diagnosis not present

## 2018-06-20 DIAGNOSIS — I5022 Chronic systolic (congestive) heart failure: Secondary | ICD-10-CM | POA: Diagnosis not present

## 2018-06-20 DIAGNOSIS — F0280 Dementia in other diseases classified elsewhere without behavioral disturbance: Secondary | ICD-10-CM | POA: Diagnosis not present

## 2018-06-20 DIAGNOSIS — I5033 Acute on chronic diastolic (congestive) heart failure: Secondary | ICD-10-CM | POA: Diagnosis not present

## 2018-06-20 DIAGNOSIS — G20 Parkinson's disease: Secondary | ICD-10-CM | POA: Diagnosis not present

## 2018-06-20 DIAGNOSIS — E1122 Type 2 diabetes mellitus with diabetic chronic kidney disease: Secondary | ICD-10-CM | POA: Diagnosis not present

## 2018-06-24 DIAGNOSIS — E1122 Type 2 diabetes mellitus with diabetic chronic kidney disease: Secondary | ICD-10-CM | POA: Diagnosis not present

## 2018-06-26 DIAGNOSIS — I5022 Chronic systolic (congestive) heart failure: Secondary | ICD-10-CM | POA: Diagnosis not present

## 2018-06-26 DIAGNOSIS — E1122 Type 2 diabetes mellitus with diabetic chronic kidney disease: Secondary | ICD-10-CM | POA: Diagnosis not present

## 2018-06-26 DIAGNOSIS — I130 Hypertensive heart and chronic kidney disease with heart failure and stage 1 through stage 4 chronic kidney disease, or unspecified chronic kidney disease: Secondary | ICD-10-CM | POA: Diagnosis not present

## 2018-06-26 DIAGNOSIS — F0280 Dementia in other diseases classified elsewhere without behavioral disturbance: Secondary | ICD-10-CM | POA: Diagnosis not present

## 2018-06-26 DIAGNOSIS — G20 Parkinson's disease: Secondary | ICD-10-CM | POA: Diagnosis not present

## 2018-06-26 DIAGNOSIS — I5033 Acute on chronic diastolic (congestive) heart failure: Secondary | ICD-10-CM | POA: Diagnosis not present

## 2018-06-27 DIAGNOSIS — E1165 Type 2 diabetes mellitus with hyperglycemia: Secondary | ICD-10-CM | POA: Diagnosis not present

## 2018-06-27 DIAGNOSIS — I10 Essential (primary) hypertension: Secondary | ICD-10-CM | POA: Diagnosis not present

## 2018-06-27 DIAGNOSIS — J449 Chronic obstructive pulmonary disease, unspecified: Secondary | ICD-10-CM | POA: Diagnosis not present

## 2018-06-27 DIAGNOSIS — N390 Urinary tract infection, site not specified: Secondary | ICD-10-CM | POA: Diagnosis not present

## 2018-06-27 DIAGNOSIS — I5032 Chronic diastolic (congestive) heart failure: Secondary | ICD-10-CM | POA: Diagnosis not present

## 2018-08-24 DIAGNOSIS — E1165 Type 2 diabetes mellitus with hyperglycemia: Secondary | ICD-10-CM | POA: Diagnosis not present

## 2018-08-24 DIAGNOSIS — Z0000 Encounter for general adult medical examination without abnormal findings: Secondary | ICD-10-CM | POA: Diagnosis not present

## 2018-08-24 DIAGNOSIS — I5032 Chronic diastolic (congestive) heart failure: Secondary | ICD-10-CM | POA: Diagnosis not present

## 2018-08-24 DIAGNOSIS — J449 Chronic obstructive pulmonary disease, unspecified: Secondary | ICD-10-CM | POA: Diagnosis not present

## 2018-08-24 DIAGNOSIS — N183 Chronic kidney disease, stage 3 (moderate): Secondary | ICD-10-CM | POA: Diagnosis not present

## 2018-10-04 DIAGNOSIS — E1165 Type 2 diabetes mellitus with hyperglycemia: Secondary | ICD-10-CM | POA: Diagnosis not present

## 2018-10-04 DIAGNOSIS — E782 Mixed hyperlipidemia: Secondary | ICD-10-CM | POA: Diagnosis not present

## 2018-10-04 DIAGNOSIS — N183 Chronic kidney disease, stage 3 (moderate): Secondary | ICD-10-CM | POA: Diagnosis not present

## 2018-10-04 DIAGNOSIS — I5032 Chronic diastolic (congestive) heart failure: Secondary | ICD-10-CM | POA: Diagnosis not present

## 2018-10-04 DIAGNOSIS — J449 Chronic obstructive pulmonary disease, unspecified: Secondary | ICD-10-CM | POA: Diagnosis not present

## 2018-10-19 ENCOUNTER — Inpatient Hospital Stay (HOSPITAL_COMMUNITY): Payer: Medicare Other

## 2018-10-19 ENCOUNTER — Emergency Department (HOSPITAL_COMMUNITY): Payer: Medicare Other

## 2018-10-19 ENCOUNTER — Inpatient Hospital Stay (HOSPITAL_COMMUNITY)
Admission: EM | Admit: 2018-10-19 | Discharge: 2018-11-24 | DRG: 291 | Disposition: E | Payer: Medicare Other | Attending: Emergency Medicine | Admitting: Emergency Medicine

## 2018-10-19 ENCOUNTER — Encounter (HOSPITAL_COMMUNITY): Payer: Self-pay | Admitting: Emergency Medicine

## 2018-10-19 DIAGNOSIS — Z9189 Other specified personal risk factors, not elsewhere classified: Secondary | ICD-10-CM

## 2018-10-19 DIAGNOSIS — Z4659 Encounter for fitting and adjustment of other gastrointestinal appliance and device: Secondary | ICD-10-CM

## 2018-10-19 DIAGNOSIS — I959 Hypotension, unspecified: Secondary | ICD-10-CM | POA: Diagnosis present

## 2018-10-19 DIAGNOSIS — R739 Hyperglycemia, unspecified: Secondary | ICD-10-CM | POA: Diagnosis present

## 2018-10-19 DIAGNOSIS — J9600 Acute respiratory failure, unspecified whether with hypoxia or hypercapnia: Secondary | ICD-10-CM | POA: Diagnosis not present

## 2018-10-19 DIAGNOSIS — E870 Hyperosmolality and hypernatremia: Secondary | ICD-10-CM | POA: Diagnosis not present

## 2018-10-19 DIAGNOSIS — I462 Cardiac arrest due to underlying cardiac condition: Secondary | ICD-10-CM | POA: Diagnosis present

## 2018-10-19 DIAGNOSIS — J9811 Atelectasis: Secondary | ICD-10-CM | POA: Diagnosis present

## 2018-10-19 DIAGNOSIS — I129 Hypertensive chronic kidney disease with stage 1 through stage 4 chronic kidney disease, or unspecified chronic kidney disease: Secondary | ICD-10-CM

## 2018-10-19 DIAGNOSIS — IMO0001 Reserved for inherently not codable concepts without codable children: Secondary | ICD-10-CM | POA: Diagnosis present

## 2018-10-19 DIAGNOSIS — J45909 Unspecified asthma, uncomplicated: Secondary | ICD-10-CM | POA: Diagnosis present

## 2018-10-19 DIAGNOSIS — R401 Stupor: Secondary | ICD-10-CM

## 2018-10-19 DIAGNOSIS — E872 Acidosis, unspecified: Secondary | ICD-10-CM | POA: Diagnosis present

## 2018-10-19 DIAGNOSIS — Z978 Presence of other specified devices: Secondary | ICD-10-CM

## 2018-10-19 DIAGNOSIS — R918 Other nonspecific abnormal finding of lung field: Secondary | ICD-10-CM | POA: Diagnosis not present

## 2018-10-19 DIAGNOSIS — J189 Pneumonia, unspecified organism: Secondary | ICD-10-CM | POA: Diagnosis present

## 2018-10-19 DIAGNOSIS — N390 Urinary tract infection, site not specified: Secondary | ICD-10-CM | POA: Diagnosis present

## 2018-10-19 DIAGNOSIS — K7200 Acute and subacute hepatic failure without coma: Secondary | ICD-10-CM | POA: Diagnosis present

## 2018-10-19 DIAGNOSIS — Z880 Allergy status to penicillin: Secondary | ICD-10-CM

## 2018-10-19 DIAGNOSIS — E1122 Type 2 diabetes mellitus with diabetic chronic kidney disease: Secondary | ICD-10-CM | POA: Diagnosis present

## 2018-10-19 DIAGNOSIS — E739 Lactose intolerance, unspecified: Secondary | ICD-10-CM | POA: Diagnosis present

## 2018-10-19 DIAGNOSIS — E039 Hypothyroidism, unspecified: Secondary | ICD-10-CM | POA: Diagnosis present

## 2018-10-19 DIAGNOSIS — Z8673 Personal history of transient ischemic attack (TIA), and cerebral infarction without residual deficits: Secondary | ICD-10-CM

## 2018-10-19 DIAGNOSIS — I2510 Atherosclerotic heart disease of native coronary artery without angina pectoris: Secondary | ICD-10-CM | POA: Diagnosis present

## 2018-10-19 DIAGNOSIS — E11649 Type 2 diabetes mellitus with hypoglycemia without coma: Secondary | ICD-10-CM | POA: Diagnosis present

## 2018-10-19 DIAGNOSIS — G40901 Epilepsy, unspecified, not intractable, with status epilepticus: Secondary | ICD-10-CM | POA: Diagnosis present

## 2018-10-19 DIAGNOSIS — M1990 Unspecified osteoarthritis, unspecified site: Secondary | ICD-10-CM | POA: Diagnosis present

## 2018-10-19 DIAGNOSIS — J9621 Acute and chronic respiratory failure with hypoxia: Secondary | ICD-10-CM | POA: Diagnosis present

## 2018-10-19 DIAGNOSIS — F0150 Vascular dementia without behavioral disturbance: Secondary | ICD-10-CM | POA: Diagnosis present

## 2018-10-19 DIAGNOSIS — Z66 Do not resuscitate: Secondary | ICD-10-CM | POA: Diagnosis not present

## 2018-10-19 DIAGNOSIS — Z7951 Long term (current) use of inhaled steroids: Secondary | ICD-10-CM

## 2018-10-19 DIAGNOSIS — N183 Chronic kidney disease, stage 3 (moderate): Secondary | ICD-10-CM | POA: Diagnosis present

## 2018-10-19 DIAGNOSIS — G40911 Epilepsy, unspecified, intractable, with status epilepticus: Secondary | ICD-10-CM | POA: Diagnosis not present

## 2018-10-19 DIAGNOSIS — E1165 Type 2 diabetes mellitus with hyperglycemia: Secondary | ICD-10-CM | POA: Diagnosis present

## 2018-10-19 DIAGNOSIS — G9340 Encephalopathy, unspecified: Secondary | ICD-10-CM | POA: Diagnosis present

## 2018-10-19 DIAGNOSIS — E662 Morbid (severe) obesity with alveolar hypoventilation: Secondary | ICD-10-CM | POA: Diagnosis present

## 2018-10-19 DIAGNOSIS — I361 Nonrheumatic tricuspid (valve) insufficiency: Secondary | ICD-10-CM | POA: Diagnosis not present

## 2018-10-19 DIAGNOSIS — I272 Pulmonary hypertension, unspecified: Secondary | ICD-10-CM | POA: Diagnosis present

## 2018-10-19 DIAGNOSIS — E785 Hyperlipidemia, unspecified: Secondary | ICD-10-CM | POA: Diagnosis present

## 2018-10-19 DIAGNOSIS — I469 Cardiac arrest, cause unspecified: Secondary | ICD-10-CM | POA: Diagnosis present

## 2018-10-19 DIAGNOSIS — F0390 Unspecified dementia without behavioral disturbance: Secondary | ICD-10-CM | POA: Diagnosis present

## 2018-10-19 DIAGNOSIS — I5030 Unspecified diastolic (congestive) heart failure: Secondary | ICD-10-CM | POA: Diagnosis present

## 2018-10-19 DIAGNOSIS — R569 Unspecified convulsions: Secondary | ICD-10-CM | POA: Diagnosis not present

## 2018-10-19 DIAGNOSIS — Z87891 Personal history of nicotine dependence: Secondary | ICD-10-CM

## 2018-10-19 DIAGNOSIS — B961 Klebsiella pneumoniae [K. pneumoniae] as the cause of diseases classified elsewhere: Secondary | ICD-10-CM | POA: Diagnosis present

## 2018-10-19 DIAGNOSIS — E0590 Thyrotoxicosis, unspecified without thyrotoxic crisis or storm: Secondary | ICD-10-CM | POA: Diagnosis present

## 2018-10-19 DIAGNOSIS — N179 Acute kidney failure, unspecified: Secondary | ICD-10-CM | POA: Diagnosis present

## 2018-10-19 DIAGNOSIS — G9389 Other specified disorders of brain: Secondary | ICD-10-CM | POA: Diagnosis not present

## 2018-10-19 DIAGNOSIS — R579 Shock, unspecified: Secondary | ICD-10-CM | POA: Diagnosis not present

## 2018-10-19 DIAGNOSIS — R4182 Altered mental status, unspecified: Secondary | ICD-10-CM | POA: Diagnosis not present

## 2018-10-19 DIAGNOSIS — Z794 Long term (current) use of insulin: Secondary | ICD-10-CM

## 2018-10-19 DIAGNOSIS — B9689 Other specified bacterial agents as the cause of diseases classified elsewhere: Secondary | ICD-10-CM | POA: Diagnosis present

## 2018-10-19 DIAGNOSIS — Z9289 Personal history of other medical treatment: Secondary | ICD-10-CM

## 2018-10-19 DIAGNOSIS — Z7189 Other specified counseling: Secondary | ICD-10-CM | POA: Diagnosis not present

## 2018-10-19 DIAGNOSIS — J9601 Acute respiratory failure with hypoxia: Secondary | ICD-10-CM | POA: Diagnosis not present

## 2018-10-19 DIAGNOSIS — Z7401 Bed confinement status: Secondary | ICD-10-CM

## 2018-10-19 DIAGNOSIS — G931 Anoxic brain damage, not elsewhere classified: Secondary | ICD-10-CM | POA: Diagnosis present

## 2018-10-19 DIAGNOSIS — I5022 Chronic systolic (congestive) heart failure: Secondary | ICD-10-CM | POA: Diagnosis present

## 2018-10-19 DIAGNOSIS — I5043 Acute on chronic combined systolic (congestive) and diastolic (congestive) heart failure: Secondary | ICD-10-CM | POA: Diagnosis present

## 2018-10-19 DIAGNOSIS — R4020 Unspecified coma: Secondary | ICD-10-CM | POA: Diagnosis not present

## 2018-10-19 DIAGNOSIS — I10 Essential (primary) hypertension: Secondary | ICD-10-CM | POA: Diagnosis present

## 2018-10-19 DIAGNOSIS — R404 Transient alteration of awareness: Secondary | ICD-10-CM | POA: Diagnosis not present

## 2018-10-19 DIAGNOSIS — J690 Pneumonitis due to inhalation of food and vomit: Secondary | ICD-10-CM | POA: Diagnosis present

## 2018-10-19 DIAGNOSIS — Z4682 Encounter for fitting and adjustment of non-vascular catheter: Secondary | ICD-10-CM | POA: Diagnosis not present

## 2018-10-19 DIAGNOSIS — J90 Pleural effusion, not elsewhere classified: Secondary | ICD-10-CM | POA: Diagnosis not present

## 2018-10-19 DIAGNOSIS — Z515 Encounter for palliative care: Secondary | ICD-10-CM | POA: Diagnosis not present

## 2018-10-19 DIAGNOSIS — Z79899 Other long term (current) drug therapy: Secondary | ICD-10-CM

## 2018-10-19 DIAGNOSIS — R0689 Other abnormalities of breathing: Secondary | ICD-10-CM | POA: Diagnosis not present

## 2018-10-19 DIAGNOSIS — Z833 Family history of diabetes mellitus: Secondary | ICD-10-CM

## 2018-10-19 DIAGNOSIS — I639 Cerebral infarction, unspecified: Secondary | ICD-10-CM | POA: Diagnosis present

## 2018-10-19 DIAGNOSIS — D631 Anemia in chronic kidney disease: Secondary | ICD-10-CM | POA: Diagnosis present

## 2018-10-19 DIAGNOSIS — I130 Hypertensive heart and chronic kidney disease with heart failure and stage 1 through stage 4 chronic kidney disease, or unspecified chronic kidney disease: Principal | ICD-10-CM | POA: Diagnosis present

## 2018-10-19 DIAGNOSIS — Z9981 Dependence on supplemental oxygen: Secondary | ICD-10-CM

## 2018-10-19 DIAGNOSIS — R000 Tachycardia, unspecified: Secondary | ICD-10-CM | POA: Diagnosis not present

## 2018-10-19 DIAGNOSIS — I370 Nonrheumatic pulmonary valve stenosis: Secondary | ICD-10-CM | POA: Diagnosis not present

## 2018-10-19 DIAGNOSIS — J9690 Respiratory failure, unspecified, unspecified whether with hypoxia or hypercapnia: Secondary | ICD-10-CM | POA: Diagnosis not present

## 2018-10-19 DIAGNOSIS — G253 Myoclonus: Secondary | ICD-10-CM | POA: Diagnosis not present

## 2018-10-19 LAB — CBC WITH DIFFERENTIAL/PLATELET
Abs Immature Granulocytes: 0.62 10*3/uL — ABNORMAL HIGH (ref 0.00–0.07)
BASOS PCT: 0 %
Basophils Absolute: 0.1 10*3/uL (ref 0.0–0.1)
EOS PCT: 2 %
Eosinophils Absolute: 0.3 10*3/uL (ref 0.0–0.5)
HCT: 33.3 % — ABNORMAL LOW (ref 36.0–46.0)
HEMOGLOBIN: 9.4 g/dL — AB (ref 12.0–15.0)
Immature Granulocytes: 4 %
LYMPHS PCT: 24 %
Lymphs Abs: 4.3 10*3/uL — ABNORMAL HIGH (ref 0.7–4.0)
MCH: 24.7 pg — AB (ref 26.0–34.0)
MCHC: 28.2 g/dL — AB (ref 30.0–36.0)
MCV: 87.4 fL (ref 80.0–100.0)
MONO ABS: 0.9 10*3/uL (ref 0.1–1.0)
MONOS PCT: 5 %
Neutro Abs: 11.8 10*3/uL — ABNORMAL HIGH (ref 1.7–7.7)
Neutrophils Relative %: 65 %
Platelets: 253 10*3/uL (ref 150–400)
RBC: 3.81 MIL/uL — AB (ref 3.87–5.11)
RDW: 14.6 % (ref 11.5–15.5)
WBC: 17.9 10*3/uL — AB (ref 4.0–10.5)
nRBC: 0 % (ref 0.0–0.2)

## 2018-10-19 LAB — COMPREHENSIVE METABOLIC PANEL
ALK PHOS: 134 U/L — AB (ref 38–126)
ALT: 197 U/L — AB (ref 0–44)
ANION GAP: 11 (ref 5–15)
AST: 314 U/L — ABNORMAL HIGH (ref 15–41)
Albumin: 2.7 g/dL — ABNORMAL LOW (ref 3.5–5.0)
BUN: 19 mg/dL (ref 8–23)
CALCIUM: 9 mg/dL (ref 8.9–10.3)
CO2: 23 mmol/L (ref 22–32)
CREATININE: 1.49 mg/dL — AB (ref 0.44–1.00)
Chloride: 102 mmol/L (ref 98–111)
GFR, EST AFRICAN AMERICAN: 42 mL/min — AB (ref >60)
GFR, EST NON AFRICAN AMERICAN: 36 mL/min — AB (ref >60)
Glucose, Bld: 235 mg/dL — ABNORMAL HIGH (ref 70–99)
Potassium: 4.2 mmol/L (ref 3.5–5.1)
SODIUM: 136 mmol/L (ref 135–145)
Total Bilirubin: 0.7 mg/dL (ref 0.3–1.2)
Total Protein: 7 g/dL (ref 6.5–8.1)

## 2018-10-19 LAB — POCT I-STAT 7, (LYTES, BLD GAS, ICA,H+H)
Acid-base deficit: 2 mmol/L (ref 0.0–2.0)
Bicarbonate: 23.3 mmol/L (ref 20.0–28.0)
Calcium, Ion: 1.16 mmol/L (ref 1.15–1.40)
HCT: 27 % — ABNORMAL LOW (ref 36.0–46.0)
Hemoglobin: 9.2 g/dL — ABNORMAL LOW (ref 12.0–15.0)
O2 Saturation: 99 %
Potassium: 3.4 mmol/L — ABNORMAL LOW (ref 3.5–5.1)
Sodium: 137 mmol/L (ref 135–145)
TCO2: 24 mmol/L (ref 22–32)
pCO2 arterial: 40.6 mmHg (ref 32.0–48.0)
pH, Arterial: 7.366 (ref 7.350–7.450)
pO2, Arterial: 122 mmHg — ABNORMAL HIGH (ref 83.0–108.0)

## 2018-10-19 LAB — I-STAT TROPONIN, ED: Troponin i, poc: 0 ng/mL (ref 0.00–0.08)

## 2018-10-19 LAB — PROTIME-INR
INR: 1.06
Prothrombin Time: 13.7 seconds (ref 11.4–15.2)

## 2018-10-19 LAB — AMMONIA: AMMONIA: 36 umol/L — AB (ref 9–35)

## 2018-10-19 LAB — CBG MONITORING, ED: Glucose-Capillary: 210 mg/dL — ABNORMAL HIGH (ref 70–99)

## 2018-10-19 LAB — LACTIC ACID, PLASMA: LACTIC ACID, VENOUS: 4.8 mmol/L — AB (ref 0.5–1.9)

## 2018-10-19 MED ORDER — PROPOFOL 1000 MG/100ML IV EMUL
INTRAVENOUS | Status: AC
  Administered 2018-10-19: 5 ug/kg/min
  Filled 2018-10-19: qty 100

## 2018-10-19 MED ORDER — PANTOPRAZOLE SODIUM 40 MG IV SOLR
40.0000 mg | Freq: Every day | INTRAVENOUS | Status: DC
  Administered 2018-10-19 – 2018-10-23 (×5): 40 mg via INTRAVENOUS
  Filled 2018-10-19 (×5): qty 40

## 2018-10-19 MED ORDER — VANCOMYCIN HCL IN DEXTROSE 1-5 GM/200ML-% IV SOLN
1000.0000 mg | Freq: Once | INTRAVENOUS | Status: AC
  Administered 2018-10-20: 1000 mg via INTRAVENOUS
  Filled 2018-10-19: qty 200

## 2018-10-19 MED ORDER — POTASSIUM CHLORIDE 10 MEQ/100ML IV SOLN
10.0000 meq | INTRAVENOUS | Status: AC
  Administered 2018-10-20 (×2): 10 meq via INTRAVENOUS
  Filled 2018-10-19: qty 100

## 2018-10-19 MED ORDER — ACETYLCYSTEINE 20 % IN SOLN
2.0000 mL | RESPIRATORY_TRACT | Status: AC
  Administered 2018-10-20 (×3): 2 mL via RESPIRATORY_TRACT
  Filled 2018-10-19 (×3): qty 4

## 2018-10-19 MED ORDER — ETOMIDATE 2 MG/ML IV SOLN
20.0000 mg | Freq: Once | INTRAVENOUS | Status: AC
  Administered 2018-10-19: 20 mg via INTRAVENOUS

## 2018-10-19 MED ORDER — ROCURONIUM BROMIDE 50 MG/5ML IV SOLN
50.0000 mg | Freq: Once | INTRAVENOUS | Status: AC
  Administered 2018-10-19: 50 mg via INTRAVENOUS
  Filled 2018-10-19: qty 5

## 2018-10-19 MED ORDER — PIPERACILLIN-TAZOBACTAM 3.375 G IVPB
3.3750 g | Freq: Once | INTRAVENOUS | Status: AC
  Administered 2018-10-20: 3.375 g via INTRAVENOUS
  Filled 2018-10-19: qty 50

## 2018-10-19 MED ORDER — IPRATROPIUM-ALBUTEROL 0.5-2.5 (3) MG/3ML IN SOLN
3.0000 mL | RESPIRATORY_TRACT | Status: DC | PRN
  Administered 2018-10-20 (×3): 3 mL via RESPIRATORY_TRACT
  Filled 2018-10-19: qty 30
  Filled 2018-10-19 (×2): qty 3

## 2018-10-19 NOTE — ED Provider Notes (Signed)
[facility] EMERGENCY DEPARTMENT Provider Note   CSN: [id number] Arrival date & time: 09/26/2018  2033     History   Chief Complaint Chief Complaint  Patient presents with  . Post CPR    HPI Angelica Beck is a 68 y.o. female.  The history is provided by the EMS personnel, medical records and a relative. No language interpreter was used.  Cardiac Arrest  Witnessed by:  Not witnessed Incident location:  Home Time since incident:  30 minutes Time before BLS initiated:  Immediate Condition upon EMS arrival:  Agonal respirations Pulse:  Absent Initial cardiac rhythm per EMS:  PEA Treatments prior to arrival:  ACLS protocol Medications given prior to ED:  Epinephrine Airway: king airway. Rhythm on admission to ED:  Normal sinus   Past Medical History:  Diagnosis Date  . Arthritis   . Asthma    09/07/16  . Diabetes mellitus, type 2 ([place])   . Diffuse goiter   . Hyperlipidemia   . Hypertension   . Hyperthyroidism   . Obesity hypoventilation syndrome ([place]) 06/20/2013  . Stroke ([place])   . Vascular dementia [facility])     Patient Active Problem List   Diagnosis Date Noted  . Acute respiratory failure with hypoxia ([place]) 05/07/2018  . Respiratory distress   . Postmenopausal bleeding 05/05/2018  . Abnormal echocardiogram   . Endometrial thickening on ultrasound 05/04/2018  . Bradycardia 05/04/2018  . Vaginal bleeding 05/03/2018  . Rectal bleeding 05/02/2018  . GI bleed 05/02/2018  . Dementia due to Parkinson's disease without behavioral disturbance ([place]) 05/02/2018  . Acute CHF (congestive heart failure) ([place]) 05/02/2018  . Renal insufficiency   . Acute on chronic diastolic CHF (congestive heart failure) ([place]) 03/23/2017  . Laryngeal stridor 03/23/2017  . Goiter diffuse 03/23/2017  . Dementia ([place]) 03/20/2017  . SOB (shortness of breath)   . Hemoptysis 02/22/2017  . Stroke ([place])   . History of hemoptysis 02/16/2017  . Chronic bronchitis ([place])  01/25/2017  . Acute bronchitis 12/27/2016  . CKD (chronic kidney disease), stage III ([place]) 12/27/2016  . PNA (pneumonia) 12/21/2016  . Edema 11/02/2016  . Diastolic dysfunction 32/95/1884  . Osteoarthritis of both knees 20-Jan-202018  . At risk for aspiration 10/03/2016  . Normocytic normochromic anemia 09/07/2016  . Acute on chronic respiratory failure ([place])   . (HFpEF) heart failure with preserved ejection fraction ([place])   . Enteritis due to Clostridium difficile   . SBO (small bowel obstruction) ([place]) 08/10/2016  . Collapse of left lung   . S/P percutaneous endoscopic gastrostomy (PEG) tube placement ([place])   . Tracheostomy status ([place])   . HCAP (healthcare-associated pneumonia) 07/16/2016  . Encephalopathy   . Palliative care encounter   . Pressure injury of skin 06/24/2016  . Acute respiratory failure ([place]) 06/21/2016  . Septic shock ([place]) 06/20/2016  . Chronic respiratory failure with hypoxia ([place]) 11/29/2015  . Essential hypertension   . Lactic acidosis 08/02/2015  . Acute encephalopathy 08/02/2015  . History of CVA (cerebrovascular accident) 08/02/2015  . Chronic systolic HF (heart failure) ([place]) 08/02/2015  . Cognitive impairment 08/02/2015  . AKI (acute kidney injury) ([place]) 02/08/2015  . Abnormality of gait 02/08/2015  . Hyperthyroidism 02/08/2015  . Type 2 DM with CKD and hypertension ([place]) 02/08/2015  . Hyperglycemia 06/28/2009  . HYPERLIPIDEMIA-MIXED 06/28/2009  . CAD, NATIVE VESSEL 06/28/2009  . DYSPNEA 06/28/2009    Past Surgical History:  Procedure Laterality Date  . IR GENERIC HISTORICAL  07/12/2016   IR US  GUIDE VASC ACCESS RIGHT 07/12/2016 [name], MD [facility]  . IR GENERIC HISTORICAL  07/12/2016   IR FLUORO GUIDE CV LINE RIGHT 07/12/2016 [name], MD [facility]  . IR GENERIC HISTORICAL  07/17/2016   IR GASTROSTOMY TUBE MOD SED 07/17/2016 [name], MD [facility]  . IR GENERIC HISTORICAL  08/23/2016   IR REMOVAL TUN CV CATH  W/O FL 08/23/2016 [name], MD [facility]  . IR GENERIC HISTORICAL  11/09/2016   IR GASTROSTOMY TUBE REMOVAL 11/09/2016 [name], PA-C [facility]  . TEE WITHOUT CARDIOVERSION N/A 05/06/2018   Procedure: TRANSESOPHAGEAL ECHOCARDIOGRAM (TEE);  Surgeon: Dorothy Spark, MD;  Location: [facility] ENDOSCOPY;  Service: Cardiovascular;  Laterality: N/A;  . TRACHEOSTOMY TUBE PLACEMENT N/A 07/14/2016   Procedure: TRACHEOSTOMY, THYROID ISTHMUSECTOMY;  Surgeon: [name], MD;  Location: [place];  Service: ENT;  Laterality: N/A;  . VENTRAL HERNIA REPAIR       OB History   No obstetric history on file.      Home Medications    Prior to Admission medications   Medication Sig Start Date End Date Taking? Authorizing Provider  acetaminophen (TYLENOL) 500 MG tablet Take 500 mg by mouth every 6 (six) hours as needed for mild pain.     [provider]  atorvastatin (LIPITOR) 40 MG tablet Take 40 mg by mouth every evening.     [provider]  b complex-vitamin c-folic acid (NEPHRO-VITE) 0.8 MG TABS tablet Take 1 tablet by mouth every morning.    [provider]  budesonide (PULMICORT) 0.5 MG/2ML nebulizer solution Take 0.5 mg by nebulization 2 (two) times daily.    [provider]  cholecalciferol (VITAMIN D) 1000 units tablet Take 1,000 Units by mouth daily.    [provider]  ciprofloxacin (CIPRO) 500 MG tablet Take 1 tablet (500 mg total) by mouth 2 (two) times daily. One po bid x 7 days 05/15/18   Street, [place], PA-C  docusate sodium (COLACE) 100 MG capsule Take 100 mg by mouth daily.     [provider]  famotidine (PEPCID) 20 MG tablet Take 20 mg by mouth daily.     [provider]  furosemide (LASIX) 20 MG tablet Take 2 tablets (40 mg total) by mouth daily. Take 40 mg p.o. daily for 1 week, and then drop down to 20 mg daily. 05/09/18   Ghimire, Henreitta Leber, MD  hydrALAZINE (APRESOLINE) 25 MG tablet Take 1 tablet (25 mg total)  by mouth every 8 (eight) hours. 05/09/18   Ghimire, Henreitta Leber, MD  insulin detemir (LEVEMIR) 100 UNIT/ML injection Inject 10 Units into the skin at bedtime.     [provider]  ipratropium-albuterol (DUONEB) 0.5-2.5 (3) MG/3ML SOLN Take 3 mLs by nebulization every 4 (four) hours as needed (for dyspnea).    [provider]  megestrol (MEGACE) 40 MG tablet Take 1 tablet (40 mg total) by mouth 2 (two) times daily. 05/09/18   Ghimire, Henreitta Leber, MD  Melatonin 3 MG TABS Take 3 mg by mouth at bedtime.     [provider]  memantine (NAMENDA) 10 MG tablet Take 10 mg by mouth 2 (two) times daily.     [provider]  methimazole (TAPAZOLE) 10 MG tablet Take 10 mg by mouth daily.     [provider]  metoprolol tartrate (LOPRESSOR) 25 MG tablet Take 0.5 tablets (12.5 mg total) by mouth 2 (two) times daily. 05/09/18   Ghimire, Henreitta Leber, MD  ondansetron (  ZOFRAN) 4 MG tablet Take 4 mg by mouth every 8 (eight) hours as needed for nausea or vomiting.    [provider]  pantoprazole (PROTONIX) 40 MG tablet Take 1 tablet (40 mg total) by mouth daily at 6 (six) AM. 05/10/18   Ghimire, Henreitta Leber, MD  polyethylene glycol (MIRALAX / GLYCOLAX) packet Take 17 g by mouth daily. 05/09/18   Ghimire, Henreitta Leber, MD  potassium chloride SA (K-DUR,KLOR-CON) 20 MEQ tablet Take 20 mEq by mouth every Monday, Wednesday, and Friday.    [provider]  senna-docusate (SENOKOT-S) 8.6-50 MG tablet Take 1 tablet by mouth at bedtime. 05/09/18   Ghimire, Henreitta Leber, MD    Family History Family History  Problem Relation Age of Onset  . Diabetes Mother   . Cancer Neg Hx   . Heart disease Neg Hx   . Stroke Neg Hx     Social History Social History   Tobacco Use  . Smoking status: Former Smoker    Packs/day: 0.25    Years: 30.00    Pack years: 7.50    Types: Cigarettes    Last attempt to quit: 09/25/2002    Years since quitting: 16.0  . Smokeless tobacco: Never Used    Substance Use Topics  . Alcohol use: No  . Drug use: No     Allergies   Penicillins and Lactose intolerance (gi)   Review of Systems Review of Systems  Unable to perform ROS: Patient unresponsive     Physical Exam Updated Vital Signs BP (!) 107/58   Pulse (!) 103   Resp (!) 30   SpO2 97%   Physical Exam Vitals signs and nursing note reviewed.  HENT:     Head: Atraumatic.     Nose: No congestion or rhinorrhea.     Mouth/Throat:     Pharynx: No oropharyngeal exudate.  Eyes:     Conjunctiva/sclera: Conjunctivae normal.     Pupils: Pupils are equal, round, and reactive to light.  Neck:     Musculoskeletal: No muscular tenderness.  Cardiovascular:     Rate and Rhythm: Normal rate.     Pulses: Normal pulses.  Pulmonary:     Effort: Respiratory distress present.     Breath sounds: Rhonchi present. No wheezing.  Chest:     Chest wall: No tenderness.  Abdominal:     Tenderness: There is no abdominal tenderness.  Musculoskeletal:        General: No tenderness.  Skin:    Capillary Refill: Capillary refill takes less than 2 seconds.     Findings: No erythema or rash.  Neurological:     Mental Status: She is unresponsive.     GCS: GCS eye subscore is 1. GCS verbal subscore is 1. GCS motor subscore is 1.     Comments: GCS 3 on arrival and not protecting airway.      ED Treatments / Results  Labs (all labs ordered are listed, but only abnormal results are displayed) Labs Reviewed  CBC WITH DIFFERENTIAL/PLATELET - Abnormal; Notable for the following components:      Result Value   WBC 17.9 (*)    RBC 3.81 (*)    Hemoglobin 9.4 (*)    HCT 33.3 (*)    MCH 24.7 (*)    MCHC 28.2 (*)    Neutro Abs 11.8 (*)    Lymphs Abs 4.3 (*)    Abs Immature Granulocytes 0.62 (*)    All other components within normal limits  COMPREHENSIVE METABOLIC PANEL - Abnormal; Notable for the following components:   Glucose, Bld 235 (*)    Creatinine, Ser 1.49 (*)    Albumin 2.7 (*)     AST 314 (*)    ALT 197 (*)    Alkaline Phosphatase 134 (*)    GFR calc non Af Amer 36 (*)    GFR calc Af Amer 42 (*)    All other components within normal limits  LACTIC ACID, PLASMA - Abnormal; Notable for the following components:   Lactic Acid, Venous 4.8 (*)    All other components within normal limits  AMMONIA - Abnormal; Notable for the following components:   Ammonia 36 (*)    All other components within normal limits  CBG MONITORING, ED - Abnormal; Notable for the following components:   Glucose-Capillary 210 (*)    All other components within normal limits  POCT I-STAT 7, (LYTES, BLD GAS, ICA,H+H) - Abnormal; Notable for the following components:   pO2, Arterial 122.0 (*)    Potassium 3.4 (*)    HCT 27.0 (*)    Hemoglobin 9.2 (*)    All other components within normal limits  CULTURE, BLOOD (ROUTINE X 2)  CULTURE, BLOOD (ROUTINE X 2)  CULTURE, BLOOD (ROUTINE X 2)  CULTURE, BLOOD (ROUTINE X 2)  URINE CULTURE  CULTURE, RESPIRATORY  MRSA PCR SCREENING  PROTIME-INR  LACTIC ACID, PLASMA  LIPASE, BLOOD  RAPID URINE DRUG SCREEN, HOSP PERFORMED  URINALYSIS, ROUTINE W REFLEX MICROSCOPIC  COMPREHENSIVE METABOLIC PANEL  MAGNESIUM  PHOSPHORUS  TROPONIN I  TROPONIN I  TROPONIN I  CBC WITH DIFFERENTIAL/PLATELET  I-STAT TROPONIN, ED  TYPE AND SCREEN    EKG EKG Interpretation  Date/Time:  Saturday October 19 2018 20:40:45 EST Ventricular Rate:  106 PR Interval:    QRS Duration: 107 QT Interval:  380 QTC Calculation: 507 R Axis:   -26 Text Interpretation:  Sinus tachycardia Inferior infarct, old Probable anteroseptal infarct, old Prolonged QT interval Baseline wander in lead(s) I II aVR When compared to prior, new PVC.  No STEMI Confirmed by Antony Blackbird [phone]) on 10/15/2018 9:21:30 PM   Radiology Ct Head Wo Contrast  Result Date: 09/26/2018 CLINICAL DATA:  Found unresponsive EXAM: CT HEAD WITHOUT CONTRAST TECHNIQUE: Contiguous axial images were obtained from  the base of the skull through the vertex without intravenous contrast. COMPARISON:  05/07/2018 FINDINGS: Brain: There is no mass, hemorrhage or extra-axial collection. There is generalized atrophy without lobar predilection. Hypodensity of the white matter is most commonly associated with chronic microvascular disease. Multiple old deep gray lacunar infarcts. Vascular: Atherosclerotic calcification of the vertebral and internal carotid arteries at the skull base. No abnormal hyperdensity of the major intracranial arteries or dural venous sinuses. Skull: The visualized skull base, calvarium and extracranial soft tissues are normal. Sinuses/Orbits: No fluid levels or advanced mucosal thickening of the visualized paranasal sinuses. No mastoid or middle ear effusion. The orbits are normal. IMPRESSION: Atrophy and chronic ischemic microangiopathy without acute intracranial abnormality. Electronically Signed   By: Ulyses Jarred M.D.   On: 10/22/2018 21:51   Dg Chest Portable 1 View  Result Date: 10/01/2018 CLINICAL DATA:  Status post endotracheal tube adjustment EXAM: PORTABLE CHEST 1 VIEW COMPARISON:  Film from earlier in the same day. FINDINGS: The endotracheal tube has been withdrawn and now lies just above the level of the carina. Persistent left basilar opacity is noted. No other focal abnormality is seen. IMPRESSION: Interval withdrawal of the endotracheal tube to just above  the carina. Persistent left basilar opacity is noted. Critical Value/emergent results were called by telephone at the time of interpretation on 09/28/2018 at 9:26 pm to Dr. Marda Stalker , who verbally acknowledged these results. Electronically Signed   By: Inez Catalina M.D.   On: 10/23/2018 21:26   Dg Chest Portable 1 View  Result Date: 10/12/2018 CLINICAL DATA:  Status post CPR EXAM: PORTABLE CHEST 1 VIEW COMPARISON:  05/15/2018 FINDINGS: Endotracheal tube is noted deep within the right mainstem bronchus. This should be withdrawn  at least 4 cm. Nasogastric catheter is noted. Lungs are hypoinflated. Mild left basilar atelectasis is noted. IMPRESSION: Hypoinflation. Tubes and lines as described above. The endotracheal tube is deep in the right mainstem bronchus and should be withdrawn several cm. Left basilar opacity is noted. By history, there has been significant vomiting in this may represent aspiration. Continued follow-up is recommended. Critical Value/emergent results were called by telephone at the time of interpretation on 10/09/2018 at 9:25 pm to Dr. Marda Stalker , who verbally acknowledged these results. Electronically Signed   By: Inez Catalina M.D.   On: 10/13/2018 21:25    Procedures Procedure Name: Intubation Date/Time: 10/21/2018 11:57 PM Performed by: Courtney Paris, MD Pre-anesthesia Checklist: Patient identified, Emergency Drugs available, Timeout performed, Patient being monitored and Suction available Oxygen Delivery Method: Ambu bag Preoxygenation: Pre-oxygenation with 100% oxygen Induction Type: Rapid sequence Laryngoscope Size: Glidescope Grade View: Grade I Tube size: 7.5 mm Number of attempts: 2 Placement Confirmation: ETT inserted through vocal cords under direct vision,  Positive ETCO2,  CO2 detector and Breath sounds checked- equal and bilateral Secured at: 24 cm Tube secured with: ETT holder Dental Injury: Teeth and Oropharynx as per pre-operative assessment  Difficulty Due To: Difficult Airway-  due to edematous airway      (including critical care time)  CRITICAL CARE Performed by: Gwenyth Allegra Allyiah Gartner Total critical care time: 60 minutes Critical care time was exclusive of separately billable procedures and treating other patients. Critical care was necessary to treat or prevent imminent or life-threatening deterioration. Critical care was time spent personally by me on the following activities: development of treatment plan with patient and/or surrogate as well as  nursing, discussions with consultants, evaluation of patient's response to treatment, examination of patient, obtaining history from patient or surrogate, ordering and performing treatments and interventions, ordering and review of laboratory studies, ordering and review of radiographic studies, pulse oximetry and re-evaluation of patient's condition.   Medications Ordered in ED Medications  pantoprazole (PROTONIX) injection 40 mg (40 mg Intravenous Given 10/04/2018 2249)  vancomycin (VANCOCIN) IVPB 1000 mg/200 mL premix (has no administration in time range)  piperacillin-tazobactam (ZOSYN) IVPB 3.375 g (has no administration in time range)  potassium chloride 10 mEq in 100 mL IVPB (has no administration in time range)  ipratropium-albuterol (DUONEB) 0.5-2.5 (3) MG/3ML nebulizer solution 3 mL (has no administration in time range)  acetylcysteine (MUCOMYST) 20 % nebulizer / oral solution 2 mL (has no administration in time range)  etomidate (AMIDATE) injection 20 mg (20 mg Intravenous Given 10/21/2018 2051)  rocuronium (ZEMURON) injection 50 mg (50 mg Intravenous Given 10/02/2018 2051)  propofol (DIPRIVAN) 1000 MG/100ML infusion (5 mcg/kg/min  New Bag/Given 10/01/2018 2102)     Initial Impression / Assessment and Plan / ED Course  I have reviewed the triage vital signs and the nursing notes.  Pertinent labs & imaging results that were available during my care of the patient were reviewed by me and considered in my  medical decision making (see chart for details).     Angelica Beck is a 68 y.o. female with a past medical history significant for prior stroke, CHF, CKD, hypertension, hyperlipidemia, diabetes, hypothyroidism, prior tracheostomy, and vascular dementia who presents with cardiac arrest.  According to EMS, patient was left unattended for 10 minutes at home in bed and then was found unresponsive.  Patient was having agonal breathing and was pulseless on EMS arrival.  Patient was found to be in  PEA arrest and had approximately 7 minutes of CPR.  Patient received 1 dose of epinephrine and a achieved ROSC.  Patient had a King airway placed and she was subsequently brought to the emergency department.  GCS of 3 during transport.  On arrival, Edison Pace airway was still in place.  Patient was having vomitus coming around her airway and out of her mouth.  GCS was still 3 and we were concerned about airway protection at this time.  Decision made to intubate.  Patient was intubated with RSI.  Airway was somewhat challenging with edematous posterior oropharynx and near her vocal cords.  Tube was placed and initial x-ray post intubation showed right mainstem.  It was pulled back several centimeters with improvement in positioning.  We were hesitant to pull back further and radiology confirmed it is in adequate position.  Unclear etiology of cardiac arrest.  Patient will have head CT and screening laboratory testing.  No family has been to the emergency department to provide further information.  Critical care called for further management.  Initial glucose was 210 and she was not hypoglycemic.  Patient will be admitted.   Final Clinical Impressions(s) / ED Diagnoses   Final diagnoses:  Cardiac arrest ([place])  Obtunded  Acute respiratory failure, unspecified whether with hypoxia or hypercapnia [facility])    ED Discharge Orders    None     Clinical Impression: 1. Cardiac arrest ([place])   2. Obtunded   3. Acute respiratory failure, unspecified whether with hypoxia or hypercapnia ([place])   4. Endotracheal tube present     Disposition: Admit  This note was prepared with assistance of Dragon voice recognition software. Occasional wrong-word or sound-a-like substitutions may have occurred due to the inherent limitations of voice recognition software.     Lawyer Washabaugh, Gwenyth Allegra, MD 10/20/18 0000

## 2018-10-19 NOTE — ED Notes (Signed)
Returned from CT scan , ETT/OGT intact , IV sites intact , Propofol drip and NS IV infusing . Ice packs intact at groin and axilla .

## 2018-10-19 NOTE — ED Notes (Signed)
Dr. Sherry Ruffing at bedside attempting intubation with RT .

## 2018-10-19 NOTE — H&P (Signed)
PULMONARY / CRITICAL CARE MEDICINE   Name: Angelica Beck MRN: [id number] DOB: 1950/12/27    ADMISSION DATE:  10/20/2018  PRIMARY SERVICE: Critical Care   CHIEF COMPLAINT:  Cardiac arrest   HPI: Angelica Beck is a 68 y.o. female with PMHx significant for CKD III, vascular dementia, hypertension, hypothyroidism, dCHF, history of GI bleed, on nocturnal O2 for chronic hypoxia, bed bound and non-ambulatory who presented with an out of hospital cardiac arrest. Per patient's family at bedside the patient was eating her evening meal when she became unresponsive and slumped over, after multiple attempts to awake her they finally decided to call 911 and begin chest compressions. EMS arrived 5-10 minutes later and continued CPR with one dose of epinephrine and placement of a king airway. An IO was placed in the lower extremity. Family denied any recent symptoms beyond her baseline and specifically denied any chest pain or palpitations from the patient. Her initial laboratory workup was notable for a negative troponin, normal electrolytes, transaminitis with elevated alk phos, EKG without ischemic changes, ABG WNL. CT head did not reveal an acute abnormalities. UA consistent with UTI and urine culture growing klebsiella. CBC showing leukocytosis.   SIGNIFICANT EVENTS / STUDIES:  -Arrived to ED s/p cardiac arrest with king airway in place and immediately intubated with RSI.  -CT of head did not reveal an acute intracranial abnormality  -CXR showed a right mainstem ET placement, it was retracted and now is just above the carina -initial labs revealed transaminitis with no electrolyte abnormalities, troponin neg X 1, EKG without signs of ischemia  -Urine culture growing klebsiella with UA consistent with UTI  -LA: 4.8  LINES / TUBES: -ET tube (1/25-present) -IO (1/25) -2 PIVs in UEs  CULTURES: -Urine 1/25: Klebsiella -Blood 1/25: pending   ANTIBIOTICS: -Vanc/Zosyn: 1/25-present     PAST  MEDICAL HISTORY :  Past Medical History:  Diagnosis Date  . Arthritis   . Asthma    09/07/16  . Diabetes mellitus, type 2 ([place])   . Diffuse goiter   . Hyperlipidemia   . Hypertension   . Hyperthyroidism   . Obesity hypoventilation syndrome ([place]) 06/20/2013  . Stroke ([name])   . Vascular dementia [facility])    Past Surgical History:  Procedure Laterality Date  . IR GENERIC HISTORICAL  07/12/2016   IR US GUIDE VASC ACCESS RIGHT 07/12/2016 [name], MD [facility]  . IR GENERIC HISTORICAL  07/12/2016   IR FLUORO GUIDE CV LINE RIGHT 07/12/2016 [name], MD [facility]  . IR GENERIC HISTORICAL  07/17/2016   IR GASTROSTOMY TUBE MOD SED 07/17/2016 [name], MD [facility]  . IR GENERIC HISTORICAL  08/23/2016   IR REMOVAL TUN CV CATH W/O FL 08/23/2016 [name], MD [facility]  . IR GENERIC HISTORICAL  11/09/2016   IR GASTROSTOMY TUBE REMOVAL 11/09/2016 [name], PA-C [facility]  . TEE WITHOUT CARDIOVERSION N/A 05/06/2018   Procedure: TRANSESOPHAGEAL ECHOCARDIOGRAM (TEE);  Surgeon: Dorothy Spark, MD;  [facility];  Service: Cardiovascular;  Laterality: N/A;  . TRACHEOSTOMY TUBE PLACEMENT N/A 07/14/2016   Procedure: TRACHEOSTOMY, THYROID ISTHMUSECTOMY;  Surgeon: [name], MD;  Location: [place];  Service: ENT;  Laterality: N/A;  . [place]     Prior to Admission medications   Medication Sig Start Date End Date Taking? Authorizing Provider  acetaminophen (TYLENOL) 500 MG tablet Take 500 mg by mouth every 6 (six) hours as needed for mild pain.     [provider]  atorvastatin (LIPITOR) 40 MG tablet Take 40 mg by mouth every evening.     [provider]  b complex-vitamin c-folic acid (NEPHRO-VITE) 0.8 MG TABS tablet Take 1 tablet by mouth every morning.    [provider]  budesonide (PULMICORT) 0.5 MG/2ML nebulizer solution Take 0.5 mg by nebulization 2 (two) times daily.    [provider]   cholecalciferol (VITAMIN D) 1000 units tablet Take 1,000 Units by mouth daily.    [provider]  ciprofloxacin (CIPRO) 500 MG tablet Take 1 tablet (500 mg total) by mouth 2 (two) times daily. One po bid x 7 days 05/15/18   Street, [employer], PA-C  docusate sodium (COLACE) 100 MG capsule Take 100 mg by mouth daily.     [provider]  famotidine (PEPCID) 20 MG tablet Take 20 mg by mouth daily.     [provider]  furosemide (LASIX) 20 MG tablet Take 2 tablets (40 mg total) by mouth daily. Take 40 mg p.o. daily for 1 week, and then drop down to 20 mg daily. 05/09/18   Ghimire, Henreitta Leber, MD  hydrALAZINE (APRESOLINE) 25 MG tablet Take 1 tablet (25 mg total) by mouth every 8 (eight) hours. 05/09/18   Ghimire, Henreitta Leber, MD  insulin detemir (LEVEMIR) 100 UNIT/ML injection Inject 10 Units into the skin at bedtime.     [provider]  ipratropium-albuterol (DUONEB) 0.5-2.5 (3) MG/3ML SOLN Take 3 mLs by nebulization every 4 (four) hours as needed (for dyspnea).    [provider]  megestrol (MEGACE) 40 MG tablet Take 1 tablet (40 mg total) by mouth 2 (two) times daily. 05/09/18   Ghimire, Henreitta Leber, MD  Melatonin 3 MG TABS Take 3 mg by mouth at bedtime.     [provider]  memantine (NAMENDA) 10 MG tablet Take 10 mg by mouth 2 (two) times daily.     [provider]  methimazole (TAPAZOLE) 10 MG tablet Take 10 mg by mouth daily.     [provider]  metoprolol tartrate (LOPRESSOR) 25 MG tablet Take 0.5 tablets (12.5 mg total) by mouth 2 (two) times daily. 05/09/18   Ghimire, Henreitta Leber, MD  ondansetron (ZOFRAN) 4 MG tablet Take 4 mg by mouth every 8 (eight) hours as needed for nausea or vomiting.    [provider]  pantoprazole (PROTONIX) 40 MG tablet Take 1 tablet (40 mg total) by mouth daily at 6 (six) AM. 05/10/18   Ghimire, Henreitta Leber, MD  polyethylene glycol (MIRALAX / GLYCOLAX) packet Take 17 g by mouth daily. 05/09/18    Ghimire, Henreitta Leber, MD  potassium chloride SA (K-DUR,KLOR-CON) 20 MEQ tablet Take 20 mEq by mouth every Monday, Wednesday, and Friday.    [provider]  senna-docusate (SENOKOT-S) 8.6-50 MG tablet Take 1 tablet by mouth at bedtime. 05/09/18   Ghimire, Henreitta Leber, MD   Allergies  Allergen Reactions  . Penicillins Swelling    FACIAL SWELLING Has patient had a PCN reaction causing immediate rash, facial/tongue/throat swelling, SOB or lightheadedness with hypotension: Yes Has patient had a PCN reaction causing severe rash involving mucus membranes or skin necrosis: No Has patient had a PCN reaction that required hospitalization: No Has patient had a PCN reaction occurring within the last 10 years: No If all of the above answers are "NO", then may proceed with Cephalosporin use.   . Lactose Intolerance (Gi) Diarrhea and Nausea And Vomiting    FAMILY HISTORY:  Family History  Problem Relation  Age of Onset  . Diabetes Mother   . Cancer Neg Hx   . Heart disease Neg Hx   . Stroke Neg Hx    SOCIAL HISTORY:  reports that she quit smoking about 16 years ago. Her smoking use included cigarettes. She has a 7.50 pack-year smoking history. She has never used smokeless tobacco. She reports that she does not drink alcohol or use drugs.  REVIEW OF SYSTEMS:  Unable to be conducted secondary to patient's sedation.   SUBJECTIVE: Unable to be conducted secondary to patient's sedation.   VITAL SIGNS: Pulse Rate:  [85-103] 86 (01/25 2200) Resp:  [18-30] 18 (01/25 2200) BP: (100-156)/(56-88) 113/61 (01/25 2200) SpO2:  [90 %-100 %] 100 % (01/25 2200) FiO2 (%):  [50 %] 50 % (01/25 2105) HEMODYNAMICS:   VENTILATOR SETTINGS: Vent Mode: PRVC FiO2 (%):  [50 %] 50 % Set Rate:  [15 bmp] 15 bmp Vt Set:  [470 mL] 470 mL PEEP:  [5 cmH20] 5 cmH20 Plateau Pressure:  [20 cmH20] 20 cmH20 INTAKE / OUTPUT: Intake/Output      01/25 0701 - 01/26 0700   I.V. 1000   Total Intake 1000   Net +1000          PHYSICAL EXAMINATION: General:  Patient sedated on ventilator.  Neuro:  Not responsive, PERRL, reflexes diminished in LEs bilaterally.  HEENT:  ET tube in place with oral secretions pooled around entry. No appreciable lesions of note.  Cardiovascular:  Distant heart sounds, 3/6 mid systolic murmur present along left sternal border.  Lungs:  Mechanical breath sounds with decreased air movement on the left. Diffuse rhoncherus breath sounds bilaterally.  Abdomen:  Obese with hyperactive bowel sounds Musculoskeletal:  Edema in LEs bilaterally.  Skin:  No identifiable lesions or rashes noted.   LABS:  CBC Recent Labs  Lab 10/13/2018 2049  WBC 17.9*  HGB 9.4*  HCT 33.3*  PLT 253   Coag's Recent Labs  Lab 10/11/2018 2049  INR 1.06   BMET Recent Labs  Lab 10/22/2018 2049  NA 136  K 4.2  CL 102  CO2 23  BUN 19  CREATININE 1.49*  GLUCOSE 235*   Electrolytes Recent Labs  Lab 10/05/2018 2049  CALCIUM 9.0   Sepsis Markers Recent Labs  Lab 10/13/2018 2045  LATICACIDVEN 4.8*   ABG No results for input(s): PHART, PCO2ART, PO2ART in the last 168 hours. Liver Enzymes Recent Labs  Lab 10/14/2018 2049  AST 314*  ALT 197*  ALKPHOS 134*  BILITOT 0.7  ALBUMIN 2.7*   Cardiac Enzymes No results for input(s): TROPONINI, PROBNP in the last 168 hours. Glucose Recent Labs  Lab 10/01/2018 2100  GLUCAP 210*    Imaging Ct Head Wo Contrast  Result Date: 10/02/2018 CLINICAL DATA:  Found unresponsive EXAM: CT HEAD WITHOUT CONTRAST TECHNIQUE: Contiguous axial images were obtained from the base of the skull through the vertex without intravenous contrast. COMPARISON:  05/07/2018 FINDINGS: Brain: There is no mass, hemorrhage or extra-axial collection. There is generalized atrophy without lobar predilection. Hypodensity of the white matter is most commonly associated with chronic microvascular disease. Multiple old deep gray lacunar infarcts. Vascular: Atherosclerotic calcification of  the vertebral and internal carotid arteries at the skull base. No abnormal hyperdensity of the major intracranial arteries or dural venous sinuses. Skull: The visualized skull base, calvarium and extracranial soft tissues are normal. Sinuses/Orbits: No fluid levels or advanced mucosal thickening of the visualized paranasal sinuses. No mastoid or middle ear effusion. The orbits are normal. IMPRESSION:  Atrophy and chronic ischemic microangiopathy without acute intracranial abnormality. Electronically Signed   By: Ulyses Jarred M.D.   On: 10/18/2018 21:51   Dg Chest Portable 1 View  Result Date: 10/04/2018 CLINICAL DATA:  Status post endotracheal tube adjustment EXAM: PORTABLE CHEST 1 VIEW COMPARISON:  Film from earlier in the same day. FINDINGS: The endotracheal tube has been withdrawn and now lies just above the level of the carina. Persistent left basilar opacity is noted. No other focal abnormality is seen. IMPRESSION: Interval withdrawal of the endotracheal tube to just above the carina. Persistent left basilar opacity is noted. Critical Value/emergent results were called by telephone at the time of interpretation on 10/18/2018 at 9:26 pm to Dr. Marda Stalker , who verbally acknowledged these results. Electronically Signed   By: Inez Catalina M.D.   On: 10/05/2018 21:26   Dg Chest Portable 1 View  Result Date: 10/18/2018 CLINICAL DATA:  Status post CPR EXAM: PORTABLE CHEST 1 VIEW COMPARISON:  05/15/2018 FINDINGS: Endotracheal tube is noted deep within the right mainstem bronchus. This should be withdrawn at least 4 cm. Nasogastric catheter is noted. Lungs are hypoinflated. Mild left basilar atelectasis is noted. IMPRESSION: Hypoinflation. Tubes and lines as described above. The endotracheal tube is deep in the right mainstem bronchus and should be withdrawn several cm. Left basilar opacity is noted. By history, there has been significant vomiting in this may represent aspiration. Continued follow-up is  recommended. Critical Value/emergent results were called by telephone at the time of interpretation on 10/22/2018 at 9:25 pm to Dr. Marda Stalker , who verbally acknowledged these results. Electronically Signed   By: Inez Catalina M.D.   On: 10/07/2018 21:25     CXR: Left sided basilar opacity concerning for pneumonia vs effusion. ET tube placed just over the carina. Will be pulled back and repeat CXR obtained.   ASSESSMENT / PLAN:  PULMONARY A: CXR concerning for left sided atelectasis, pneumonia, or effusion. Patient did have a right mainstem placed ET tube so likely atelectasis. Aspiration PNA is in the differential based upon family reports prior to her arrest.  P:   -Maintain mechanical ventilation with PRVC, Vt: 8cc/kg, RR: 16, PEEP: 5, FiO2: 40%.  -Pulmonary toilet with mucomyst, duonebs -Repeat CXR after retraction of deeply placed ET tube for confirmation of appropriate placement.  -ABG PRN    CARDIOVASCULAR A: Unclear etiology for cardiac arrest, however coronary thrombosis remains high.  P:  -EKG PRN -Telemetry  -Trend troponins -TTE ordered and will be followed up -Net negative fluid balance although CXR less concerning for volume overloaded state -Monitoring BP with cuff for now and have consented family for additional placement of arterial and central lines if needed.   RENAL A:  CKD at baseline with acute on chronic AKI on admission. Lytes all WNL.  P:   -Monitor BMP -Replace to keep K>4 and Mg>2 -Avoid nephrotoxic medications   GASTROINTESTINAL A:  No concerns from a GI standpoint at this time.  P:   -PPI IV for stress ulcer prophylaxis  -NPO for now   HEMATOLOGIC A:  No laboratory abnormalities on admission P:  -Continue to monitor blood counts  -SCDs placed for thrombophrophylaxis   INFECTIOUS A:  Leukocytosis on CBC with UA concerning for UTI. Urine culture growing Klebsiella. Possible PNA on CXR and history concerning for aspiration.  P:    -Follow up blood cultures and urine culture  -Continue vancomycin/zosyn for broad spectrum coverage with narrowing upon clinical improvement or microbiological source identification  ENDOCRINE A:  Hypothyroid at baseline P:   -Will continue synthroid  -SSI PRN   NEUROLOGIC A:  Baseline cerebrovascular dementia, given her unknown arrhythmia and time prior to EMS arrival will opt to not induce a hypothermic state at this time but to avoid hyperthermia. Currently sedated for mechanical ventilatory support. CT head without contrast did not reveal any acute neurological abnormality.  P:   -Normothermia over next 24-48 hrs -Continue sedation with RASS 0 until read to trial SBTs -Periodic neurological checks to assess for any potential new focal deficits    TODAY'S SUMMARY:  Arrival Method: Bed accompanied by 2 RNs and RT Mental Orientation: Patient sedated  Telemetry: Pt placed on monitor. Central tele and [facility] aware of pt's transfer Assessment: Completed Skin: wnl  IV: flushes easily, no pain, no blood return Pain: no pain, patient sedated   Environmental changes completed to facilitate rest and relaxation.  Safety Measures: Bed alarm obn, 2/4 bed rails up.  Family: Family at bedside with pt and updated with plan.    I have personally obtained a history, examined the patient, evaluated laboratory and imaging results, formulated the assessment and plan and placed orders. CRITICAL CARE: The patient is critically ill with multiple organ systems failure and requires high complexity decision making for assessment and support, frequent evaluation and titration of therapies, application of advanced monitoring technologies and extensive interpretation of multiple databases. Critical Care Time devoted to patient care services described in this note is 45 minutes.   Wanda Plump, M.D.  Pulmonary and [place] Pager: [phone]  10/01/2018, 10:36  PM

## 2018-10-19 NOTE — ED Notes (Signed)
Ice applied to pt.'s groin/axilla .

## 2018-10-19 NOTE — ED Triage Notes (Signed)
Patient arrived with EMS from home found unresponsive by family this evening , CPR initiated by EMS ( at [place]) received 1 Epinephrine for PEA the ROSC ( at 2003) , [place] airway and I/O inserted prior to arrival . NSR at arrival .

## 2018-10-19 NOTE — Progress Notes (Signed)
RT Note:  Patient was intubated at 26 at the Lip.  After CXR, the ED physician asked me to pull the tube back 2 cm.  Pulled tube back to 24 at the lip.

## 2018-10-19 NOTE — Progress Notes (Signed)
RT notified to pull ET tube back 2cm.   Caffie Damme, MD  Pulmonary and [place] Pager: [phone]

## 2018-10-20 ENCOUNTER — Inpatient Hospital Stay (HOSPITAL_COMMUNITY): Payer: Medicare Other

## 2018-10-20 ENCOUNTER — Other Ambulatory Visit: Payer: Self-pay

## 2018-10-20 DIAGNOSIS — I361 Nonrheumatic tricuspid (valve) insufficiency: Secondary | ICD-10-CM

## 2018-10-20 DIAGNOSIS — I370 Nonrheumatic pulmonary valve stenosis: Secondary | ICD-10-CM

## 2018-10-20 DIAGNOSIS — G40911 Epilepsy, unspecified, intractable, with status epilepticus: Secondary | ICD-10-CM

## 2018-10-20 DIAGNOSIS — J9601 Acute respiratory failure with hypoxia: Secondary | ICD-10-CM

## 2018-10-20 LAB — COMPREHENSIVE METABOLIC PANEL
ALBUMIN: 2.5 g/dL — AB (ref 3.5–5.0)
ALT: 154 U/L — ABNORMAL HIGH (ref 0–44)
AST: 257 U/L — ABNORMAL HIGH (ref 15–41)
Alkaline Phosphatase: 110 U/L (ref 38–126)
Anion gap: 12 (ref 5–15)
BUN: 19 mg/dL (ref 8–23)
CO2: 21 mmol/L — ABNORMAL LOW (ref 22–32)
Calcium: 8.3 mg/dL — ABNORMAL LOW (ref 8.9–10.3)
Chloride: 103 mmol/L (ref 98–111)
Creatinine, Ser: 1.39 mg/dL — ABNORMAL HIGH (ref 0.44–1.00)
GFR calc Af Amer: 46 mL/min — ABNORMAL LOW (ref >60)
GFR calc non Af Amer: 40 mL/min — ABNORMAL LOW (ref >60)
GLUCOSE: 303 mg/dL — AB (ref 70–99)
Potassium: 3.5 mmol/L (ref 3.5–5.1)
Sodium: 136 mmol/L (ref 135–145)
Total Bilirubin: 0.8 mg/dL (ref 0.3–1.2)
Total Protein: 5.9 g/dL — ABNORMAL LOW (ref 6.5–8.1)

## 2018-10-20 LAB — CBC WITH DIFFERENTIAL/PLATELET
Abs Immature Granulocytes: 0.07 10*3/uL (ref 0.00–0.07)
Basophils Absolute: 0 10*3/uL (ref 0.0–0.1)
Basophils Relative: 0 %
Eosinophils Absolute: 0 10*3/uL (ref 0.0–0.5)
Eosinophils Relative: 0 %
HCT: 28.1 % — ABNORMAL LOW (ref 36.0–46.0)
HEMOGLOBIN: 8.6 g/dL — AB (ref 12.0–15.0)
Immature Granulocytes: 0 %
LYMPHS PCT: 3 %
Lymphs Abs: 0.5 10*3/uL — ABNORMAL LOW (ref 0.7–4.0)
MCH: 25.8 pg — ABNORMAL LOW (ref 26.0–34.0)
MCHC: 30.6 g/dL (ref 30.0–36.0)
MCV: 84.4 fL (ref 80.0–100.0)
Monocytes Absolute: 0.8 10*3/uL (ref 0.1–1.0)
Monocytes Relative: 5 %
NRBC: 0 % (ref 0.0–0.2)
Neutro Abs: 15.3 10*3/uL — ABNORMAL HIGH (ref 1.7–7.7)
Neutrophils Relative %: 92 %
Platelets: 223 10*3/uL (ref 150–400)
RBC: 3.33 MIL/uL — ABNORMAL LOW (ref 3.87–5.11)
RDW: 14.5 % (ref 11.5–15.5)
WBC: 16.6 10*3/uL — AB (ref 4.0–10.5)

## 2018-10-20 LAB — LACTIC ACID, PLASMA: Lactic Acid, Venous: 1.8 mmol/L (ref 0.5–1.9)

## 2018-10-20 LAB — ECHOCARDIOGRAM COMPLETE
Height: 65 in
Weight: 3350.99 oz

## 2018-10-20 LAB — LIPASE, BLOOD: LIPASE: 29 U/L (ref 11–51)

## 2018-10-20 LAB — TROPONIN I
TROPONIN I: 0.06 ng/mL — AB (ref <0.03)
Troponin I: 0.09 ng/mL (ref <0.03)
Troponin I: 0.09 ng/mL (ref <0.03)

## 2018-10-20 LAB — GLUCOSE, CAPILLARY
Glucose-Capillary: 319 mg/dL — ABNORMAL HIGH (ref 70–99)
Glucose-Capillary: 230 mg/dL — ABNORMAL HIGH (ref 70–99)
Glucose-Capillary: 103 mg/dL — ABNORMAL HIGH (ref 70–99)
Glucose-Capillary: 130 mg/dL — ABNORMAL HIGH (ref 70–99)
Glucose-Capillary: 67 mg/dL — ABNORMAL LOW (ref 70–99)

## 2018-10-20 LAB — PHOSPHORUS: Phosphorus: 3.7 mg/dL (ref 2.5–4.6)

## 2018-10-20 LAB — MRSA PCR SCREENING: MRSA by PCR: NEGATIVE

## 2018-10-20 LAB — TRIGLYCERIDES: Triglycerides: 54 mg/dL (ref <150)

## 2018-10-20 LAB — MAGNESIUM: Magnesium: 1.5 mg/dL — ABNORMAL LOW (ref 1.7–2.4)

## 2018-10-20 MED ORDER — MAGNESIUM SULFATE 50 % IJ SOLN: 1.0000 g | Freq: Once | INTRAMUSCULAR | Status: DC

## 2018-10-20 MED ORDER — ALBUMIN HUMAN 25 % IV SOLN
25.0000 g | Freq: Once | INTRAVENOUS | Status: AC
  Administered 2018-10-20: 25 g via INTRAVENOUS
  Filled 2018-10-20: qty 50

## 2018-10-20 MED ORDER — SODIUM CHLORIDE 0.9 % IV BOLUS
250.0000 mL | Freq: Once | INTRAVENOUS | Status: AC
  Administered 2018-10-20: 250 mL via INTRAVENOUS

## 2018-10-20 MED ORDER — INSULIN ASPART 100 UNIT/ML ~~LOC~~ SOLN
0.0000 [IU] | SUBCUTANEOUS | Status: DC
  Administered 2018-10-23 – 2018-10-25 (×6): 1 [IU] via SUBCUTANEOUS

## 2018-10-20 MED ORDER — VALPROATE SODIUM 500 MG/5ML IV SOLN
30.0000 mg/kg | Freq: Once | INTRAVENOUS | Status: AC
  Administered 2018-10-20: 2850 mg via INTRAVENOUS
  Filled 2018-10-20: qty 28.5

## 2018-10-20 MED ORDER — METHIMAZOLE 10 MG PO TABS
10.0000 mg | ORAL_TABLET | Freq: Every day | ORAL | Status: DC
  Administered 2018-10-21 – 2018-10-26 (×6): 10 mg
  Filled 2018-10-20 (×6): qty 1

## 2018-10-20 MED ORDER — FENTANYL CITRATE (PF) 100 MCG/2ML IJ SOLN: 50.0000 ug | INTRAMUSCULAR | Status: DC | PRN

## 2018-10-20 MED ORDER — DEXTROSE 50 % IV SOLN
25.0000 mL | Freq: Once | INTRAVENOUS | Status: AC
  Administered 2018-10-20: 25 mL via INTRAVENOUS

## 2018-10-20 MED ORDER — LEVETIRACETAM IN NACL 1500 MG/100ML IV SOLN
1500.0000 mg | Freq: Two times a day (BID) | INTRAVENOUS | Status: DC
  Administered 2018-10-20 – 2018-10-26 (×12): 1500 mg via INTRAVENOUS
  Filled 2018-10-20 (×13): qty 100

## 2018-10-20 MED ORDER — PROPOFOL 1000 MG/100ML IV EMUL
5.0000 ug/kg/min | INTRAVENOUS | Status: DC
  Administered 2018-10-20: 25 ug/kg/min via INTRAVENOUS
  Filled 2018-10-20: qty 100

## 2018-10-20 MED ORDER — SODIUM CHLORIDE 0.9 % IV SOLN
250.0000 mL | INTRAVENOUS | Status: DC
  Administered 2018-10-20 – 2018-10-23 (×2): 250 mL via INTRAVENOUS

## 2018-10-20 MED ORDER — ORAL CARE MOUTH RINSE
15.0000 mL | OROMUCOSAL | Status: DC
  Administered 2018-10-20 – 2018-10-26 (×62): 15 mL via OROMUCOSAL

## 2018-10-20 MED ORDER — PROPOFOL 1000 MG/100ML IV EMUL
5.0000 ug/kg/min | INTRAVENOUS | Status: DC
  Administered 2018-10-20: 20 ug/kg/min via INTRAVENOUS
  Administered 2018-10-20: 40 ug/kg/min via INTRAVENOUS
  Administered 2018-10-20: 25 ug/kg/min via INTRAVENOUS
  Administered 2018-10-20: 20 ug/kg/min via INTRAVENOUS
  Administered 2018-10-21: 25 ug/kg/min via INTRAVENOUS
  Administered 2018-10-21 – 2018-10-22 (×7): 50 ug/kg/min via INTRAVENOUS
  Administered 2018-10-22: 25 ug/kg/min via INTRAVENOUS
  Administered 2018-10-22: 25.088 ug/kg/min via INTRAVENOUS
  Administered 2018-10-22 – 2018-10-23 (×2): 25 ug/kg/min via INTRAVENOUS
  Filled 2018-10-20 (×17): qty 100

## 2018-10-20 MED ORDER — VALPROATE SODIUM 500 MG/5ML IV SOLN
15.0000 mg/kg/d | Freq: Three times a day (TID) | INTRAVENOUS | Status: DC
  Administered 2018-10-20 – 2018-10-26 (×18): 475 mg via INTRAVENOUS
  Filled 2018-10-20 (×20): qty 4.75

## 2018-10-20 MED ORDER — MIDAZOLAM HCL 2 MG/2ML IJ SOLN
1.0000 mg | INTRAMUSCULAR | Status: AC | PRN
  Administered 2018-10-21: 2 mg via INTRAVENOUS
  Administered 2018-10-21: 1 mg via INTRAVENOUS
  Administered 2018-10-21: 2 mg via INTRAVENOUS

## 2018-10-20 MED ORDER — MIDAZOLAM HCL 2 MG/2ML IJ SOLN
1.0000 mg | INTRAMUSCULAR | Status: DC | PRN
  Filled 2018-10-20: qty 2

## 2018-10-20 MED ORDER — INSULIN ASPART 100 UNIT/ML ~~LOC~~ SOLN
0.0000 [IU] | Freq: Three times a day (TID) | SUBCUTANEOUS | Status: DC
  Administered 2018-10-20: 7 [IU] via SUBCUTANEOUS
  Administered 2018-10-20: 15 [IU] via SUBCUTANEOUS

## 2018-10-20 MED ORDER — VANCOMYCIN HCL 10 G IV SOLR
1250.0000 mg | INTRAVENOUS | Status: DC
  Filled 2018-10-20: qty 1250

## 2018-10-20 MED ORDER — LEVETIRACETAM IN NACL 1500 MG/100ML IV SOLN
1500.0000 mg | Freq: Once | INTRAVENOUS | Status: AC
  Administered 2018-10-20: 1500 mg via INTRAVENOUS
  Filled 2018-10-20: qty 100

## 2018-10-20 MED ORDER — PIPERACILLIN-TAZOBACTAM 3.375 G IVPB
3.3750 g | Freq: Three times a day (TID) | INTRAVENOUS | Status: DC
  Administered 2018-10-20 – 2018-10-21 (×3): 3.375 g via INTRAVENOUS
  Filled 2018-10-20 (×3): qty 50

## 2018-10-20 MED ORDER — PHENYLEPHRINE HCL-NACL 10-0.9 MG/250ML-% IV SOLN
0.0000 ug/min | INTRAVENOUS | Status: DC
  Administered 2018-10-22: 40 ug/min via INTRAVENOUS
  Administered 2018-10-23: 20 ug/min via INTRAVENOUS
  Administered 2018-10-23: 25 ug/min via INTRAVENOUS
  Filled 2018-10-20 (×4): qty 250

## 2018-10-20 MED ORDER — FENTANYL CITRATE (PF) 100 MCG/2ML IJ SOLN
50.0000 ug | INTRAMUSCULAR | Status: DC | PRN
  Filled 2018-10-20: qty 2

## 2018-10-20 MED ORDER — LORAZEPAM 2 MG/ML IJ SOLN
4.0000 mg | Freq: Once | INTRAMUSCULAR | Status: AC
  Administered 2018-10-20: 4 mg via INTRAVENOUS
  Filled 2018-10-20: qty 2

## 2018-10-20 MED ORDER — CHLORHEXIDINE GLUCONATE 0.12% ORAL RINSE (MEDLINE KIT)
15.0000 mL | Freq: Two times a day (BID) | OROMUCOSAL | Status: DC
  Administered 2018-10-20 – 2018-10-26 (×14): 15 mL via OROMUCOSAL

## 2018-10-20 MED ORDER — MIDAZOLAM 50MG/50ML (1MG/ML) PREMIX INFUSION
20.0000 mg/h | INTRAVENOUS | Status: DC
  Administered 2018-10-20 (×2): 9 mg/h via INTRAVENOUS
  Administered 2018-10-21 (×2): 20 mg/h via INTRAVENOUS
  Administered 2018-10-21 (×2): 9 mg/h via INTRAVENOUS
  Administered 2018-10-21 (×2): 20 mg/h via INTRAVENOUS
  Administered 2018-10-21: 9 mg/h via INTRAVENOUS
  Administered 2018-10-21 – 2018-10-22 (×4): 20 mg/h via INTRAVENOUS
  Administered 2018-10-22 (×3): 10 mg/h via INTRAVENOUS
  Administered 2018-10-22 (×2): 20 mg/h via INTRAVENOUS
  Administered 2018-10-23: 3 mg/h via INTRAVENOUS
  Administered 2018-10-23: 10 mg/h via INTRAVENOUS
  Filled 2018-10-20 (×20): qty 50

## 2018-10-20 MED ORDER — NOREPINEPHRINE-SODIUM CHLORIDE 4-0.9 MG/250ML-% IV SOLN
0.0000 ug/min | INTRAVENOUS | Status: DC
  Filled 2018-10-20: qty 250

## 2018-10-20 MED ORDER — ATORVASTATIN CALCIUM 40 MG PO TABS
40.0000 mg | ORAL_TABLET | Freq: Every day | ORAL | Status: DC
  Administered 2018-10-20 – 2018-10-24 (×5): 40 mg via ORAL
  Filled 2018-10-20 (×5): qty 1

## 2018-10-20 MED ORDER — METHIMAZOLE 10 MG PO TABS
10.0000 mg | ORAL_TABLET | Freq: Every day | ORAL | Status: DC
  Administered 2018-10-20: 10 mg via ORAL
  Filled 2018-10-20: qty 1

## 2018-10-20 MED ORDER — ALBUMIN HUMAN 25 % IV SOLN
25.0000 g | Freq: Four times a day (QID) | INTRAVENOUS | Status: AC
  Administered 2018-10-20 (×2): 25 g via INTRAVENOUS
  Filled 2018-10-20 (×3): qty 50

## 2018-10-20 MED ORDER — MAGNESIUM SULFATE IN D5W 1-5 GM/100ML-% IV SOLN
1.0000 g | Freq: Once | INTRAVENOUS | Status: AC
  Administered 2018-10-20: 1 g via INTRAVENOUS
  Filled 2018-10-20: qty 100

## 2018-10-20 MED ORDER — DEXTROSE 50 % IV SOLN
INTRAVENOUS | Status: AC
  Administered 2018-10-20: 25 mL via INTRAVENOUS
  Filled 2018-10-20: qty 50

## 2018-10-20 NOTE — Procedures (Signed)
History: 68 yo F s/p cardiac arrest  Sedation: Propofol  Technique: This is a 21 channel routine scalp EEG performed at the bedside with bipolar and monopolar montages arranged in accordance to the international 10/20 system of electrode placement. One channel was dedicated to EKG recording.    Background: The background is discontinuous with 3-4Hz  spikes and sharp waves that are bitemporally predominant that have accompanying rhythmic slow activity and beta activty overlying it. They occur in runs with aftergoing runs of less sharply contoured rhythmic activity. This is intermixed with brief periods of generalized attenuation. On video, there eis some clinical accompanyment to the discharges as time-locked movements of the head are seen, but unclear from the video whether this is myoclonic.   Photic stimulation: Physiologic driving is not performed  EEG Abnormalities: 1) Generalized status epilepticus  Clinical Interpretation: This EEG is consistent with status epilepticus. This was communicated to the neurologist on call while the recording was in progress.   Roland Rack, MD [facility] [phone]  If 7pm- 7am, please page neurology on call as listed in [place].

## 2018-10-20 NOTE — Progress Notes (Signed)
Hypoglycemic Event  CBG: 67  Treatment: D50 25 mL (12.5 gm) at 22:39  Symptoms: None  Follow-up CBG: Time: 22:53 CBG Result: 130  Possible Reasons for Event: Inadequate meal intake and Other: seizures  Comments/MD notified: will notify [facility] to request q4hr CBG checks and possibly some D10 IVF since pt is NPO.    Angelica Beck

## 2018-10-20 NOTE — Plan of Care (Signed)
  Problem: Clinical Measurements: Goal: Ability to maintain clinical measurements within normal limits will improve Outcome: Progressing Goal: Cardiovascular complication will be avoided Outcome: Progressing   Problem: Elimination: Goal: Will not experience complications related to urinary retention Outcome: Progressing

## 2018-10-20 NOTE — Progress Notes (Signed)
IO access present on R Leg. Removed without issues and Vaseline/guaze dressing placed.

## 2018-10-20 NOTE — Progress Notes (Signed)
NAME:  Angelica Beck, MRN:  [id number], DOB:  02/23/1951, LOS: 1 ADMISSION DATE:  10/21/2018, CONSULTATION DATE: October 19, 2018 REFERRING MD:  Dr. Sherry Ruffing, CHIEF COMPLAINT: Cardiac arrest  Brief History   Ms. Angelica Beck is a 68 year old woman with vascular dementia, history of stroke, CKD stage III, hypertension, hypothyroidism, diastolic heart failure, history of GI bleed, chronic hypoxia on nocturnal oxygen and nonambulatory at baseline who presented following cardiac arrest.  She achieve ROSC after 7 to 12 minutes of CPR and epinephrine.  On arrival, bedside EEG was consistent with status epilepticus.  Past Medical History  Vascular dementia History of stroke CKD stage III Hypertension Hypothyroidism Diastolic heart failure History of GI bleed Chronic hypoxia on nocturnal oxygen Nonambulatory  Significant Hospital Events   1/26 admit>> 1/26 EMS intubation >> 1/26 EEG >> generalized status epilepticus  Consults:  Neurology  Procedures:  1/26 admit>>  Significant Diagnostic Tests:  1/25 CT head>> unremarkable 1/26 EEG >> generalized status epilepticus 1/26 LTM EEG >>  Micro Data:  1/25 blood culture>> 1/25 tracheal aspirate>> 1/25 urine culture>>  Antimicrobials:  1/25 vancomycin>> 4/25 Zosyn>>  Interim history/subjective:  Overnight: Labile temperature, labile BP with SBP 80s-100s  Today, Angelica Beck remains sedated and unresponsive.  Objective   Blood pressure (!) 85/43, pulse 100, temperature (!) 96.8 F (36 C), resp. rate 17, height 5\' 5"  (1.651 m), weight 95 kg, SpO2 100 %.    Vent Mode: PRVC FiO2 (%):  [40 %-60 %] 40 % Set Rate:  [15 bmp] 15 bmp Vt Set:  [450 mL-470 mL] 450 mL PEEP:  [5 cmH20] 5 cmH20 Plateau Pressure:  [19 cmH20-20 cmH20] 19 cmH20   Intake/Output Summary (Last 24 hours) at 10/20/2018 1230 Last data filed at 10/20/2018 0600 Gross per 24 hour  Intake 1611.57 ml  Output 600 ml  Net 1011.57 ml   Filed Weights   10/20/18 0300    Weight: 95 kg    Examination: General: Sedated, unresponsive, middle-aged appearing woman HENT: ET tube and OG tube in place Lungs: Diminished breath sounds, no wheezes or crackles Cardiovascular: Tachycardia, no MRG Abdomen: BS+, NTTP, soft  Extremities: Warm to touch, no edema Neuro: Sedated   Resolved Hospital Problem list     Assessment & Plan:  [name] is a 68 year old woman with vascular dementia, history of stroke, CKD stage III, hypertension, hypothyroidism, diastolic heart failure, history of GI bleed, chronic hypoxia on nocturnal oxygen and nonambulatory at baseline who presented following cardiac arrest s/p CPR, epinephrine with ROSC after 7-12 mins. Also found to be in status epilepticus on arrival.   #Myoclonic status epilepticus #Suspecting anoxic brain injury - Continue LTM EEG - Continue Keppra 1500 mg IV BID, Depakote loading then 5 mg/Kg IV TID - Restart propofol when BP allows - Appreciate neurology recommendation - Neurochecks  #Acute hypoxic/hypoxemic respiratory failure #Inability to protect airway -Secondary to cardiac arrest -Continue full mechanical ventilation -Pulmonary hygiene -Not ready for weaning or SBT -Follow-up chest x-ray in the a.m. -ABG as needed  #Hypotension - Multifactorial. secondary to sedation, active warming, decreased adrenergic response from ongoing neurological pathology -Will require central line placement to start pressors -Pressors to keep MAP>65 -Follow-up cortisol  #Status post cardiac arrest -Etiology remains unclear.  Given her history of diastolic heart failure, cardiac arrhythmia is possible.  Also an ischemic event also remains a possibility - Telemetry telemetry monitoring -Trend troponin (0.06<0.09<0.09) - Echocardiogram reviewed preserved ejection fraction with EF 55 to 60%, pulmonary hypertension with peak pressure 73mmHg  #Probable  aspiration pneumonia #Low suspicion for sepsis - Initial CXR with  left basilar consolidation however repeat chest x-ray was unremarkable with good aeration and low suspicion for consolidation -Currently on vancomycin and Zosyn.  Will discontinue as lactic acidosis and leukocytosis were most likely secondary to seizure and cardiac arrest. -Blood culture no growth <24 hours  #Hypothyroidism -Continue Synthroid  #Chronic kidney disease -Continue to monitor BMP   Best practice:  Diet: N.p.o. Pain/Anxiety/Delirium protocol (if indicated): Fentanyl, Versed VAP protocol (if indicated): Yes DVT prophylaxis: SCDs GI prophylaxis: IV Protonix Glucose control: Sliding scale insulin Mobility: Bedrest Code Status: Full code Family Communication: Daughter at bedside Disposition: Remain in ICU  Labs   CBC: Recent Labs  Lab 10/10/2018 2049 10/13/2018 2303 10/20/18 0604  WBC 17.9*  --  16.6*  NEUTROABS 11.8*  --  15.3*  HGB 9.4* 9.2* 8.6*  HCT 33.3* 27.0* 28.1*  MCV 87.4  --  84.4  PLT 253  --  109    Basic Metabolic Panel: Recent Labs  Lab 10/20/2018 2049 10/01/2018 2303 10/20/18 0031 10/20/18 0604  NA 136 137  --  136  K 4.2 3.4*  --  3.5  CL 102  --   --  103  CO2 23  --   --  21*  GLUCOSE 235*  --   --  303*  BUN 19  --   --  19  CREATININE 1.49*  --   --  1.39*  CALCIUM 9.0  --   --  8.3*  MG  --   --  1.5*  --   PHOS  --   --  3.7  --    GFR: Estimated Creatinine Clearance: 46 mL/min (A) (by C-G formula based on SCr of 1.39 mg/dL (H)). Recent Labs  Lab 09/25/2018 2045 09/30/2018 2049 10/20/18 0031 10/20/18 0604  WBC  --  17.9*  --  16.6*  LATICACIDVEN 4.8*  --  1.8  --     Liver Function Tests: Recent Labs  Lab 09/27/2018 2049 10/20/18 0604  AST 314* 257*  ALT 197* 154*  ALKPHOS 134* 110  BILITOT 0.7 0.8  PROT 7.0 5.9*  ALBUMIN 2.7* 2.5*   Recent Labs  Lab 10/20/18 0031  LIPASE 29   Recent Labs  Lab 10/25/2018 2045  AMMONIA 36*    ABG    Component Value Date/Time   PHART 7.366 09/27/2018 2303   PCO2ART 40.6  10/14/2018 2303   PO2ART 122.0 (H) 10/18/2018 2303   HCO3 23.3 10/15/2018 2303   TCO2 24 10/08/2018 2303   ACIDBASEDEF 2.0 10/18/2018 2303   O2SAT 99.0 10/02/2018 2303     Coagulation Profile: Recent Labs  Lab 10/02/2018 2049  INR 1.06    Cardiac Enzymes: Recent Labs  Lab 10/20/18 0031 10/20/18 0604 10/20/18 1135  TROPONINI 0.09* 0.09* 0.06*    HbA1C: Hemoglobin A1C  Date/Time Value Ref Range Status  03/10/2017 7.4  Final  11/29/2016 6.5  Final    CBG: Recent Labs  Lab 10/20/2018 2100 10/20/18 0754 10/20/18 1139  GLUCAP 210* 319* 230*

## 2018-10-20 NOTE — Progress Notes (Signed)
Notified by Dr. Leonel Ramsay regarding spot EEG result. The tracings are most consistent with myoclonic status epilepticus.   BP (!) 112/53   Pulse 100   Temp (!) 95.9 F (35.5 C)   Resp 19   Ht 5\' 5"  (1.651 m)   Wt 95 kg Comment: from Aug 2019 records, please update!  SpO2 100%   BMI 34.85 kg/m   Exam: Ment: Sedated on propofol. No responses to external stimuli. Eyes closed.  CN: Face flaccidly symmetric (on propofol). Intubated.  Motor: Intermittent low-amplitude myoclonic jerking of neck, abdomen and toes. No myoclonus of upper extremities noted during exam.  CT head showed no acute abnormality. Chronic small vessel ischemic findings were noted.   A/R: 68 year old female with probable anoxic brain injury. EEG and clinical exam are most consistent with myoclonic status epilepticus.  1. Keppra 1500 mg IV BID 2. Loading with valproic acid 30 mg/kg, then start 5 mg/kg IV TID.  3. Starting LTM EEG.  4. Monitor and correct electrolytes as indicated by lab values 5. Has leukocytosis and elevated LFTs, a typical pattern seen in patients s/p cardiac arrest with severe anoxic sequelae 6. Cannot titrate propofol upwards currently due to hypotension. When BP improves, RN has been asked to call Neurology for recommendations on starting Versed gtt.   20 additional minutes spent in the emergent neurological evaluation and management of this critically ill patient.   Electronically signed: Dr. Kerney Elbe

## 2018-10-20 NOTE — Progress Notes (Signed)
EEG completed, vLTM EEG to follow based on routine eeg results

## 2018-10-20 NOTE — Consult Note (Signed)
Requesting Physician: Dr. Jimmy Footman    Chief Complaint: Body jerks  History obtained from: Chart    HPI:                                                                                                                                       Angelica Beck is an 68 y.o. female with past medical history of vascular dementia, hypertension, CHF, CKD 3, chronic hypoxia bedbound and nonambulatory at baseline admitted to cardiac ICU for PEA arrest.  Chart review patient family was at bedside when patient was eating and suddenly she became unresponsive and slurred over.  After attempts to wake her up patient, only began chest compressions and EMS arrived 5 to 10 minutes later.  ROSC was obtained about 5 to 10 minutes later after 1 dose of epinephrine.  Etiology of cardiac arrest unclear, patient did not undergo hypothermia.  Around 6 AM, nurse reported seizure-like activity.  E link physician Dr. Jimmy Footman altered neurology for evaluation of possible seizures.  On assessment patient was biting the tube and every few seconds would have up rolling of eyes and whole-body jerks lasting few seconds.      Past Medical History:  Diagnosis Date  . Arthritis   . Asthma    09/07/16  . Diabetes mellitus, type 2 ([place])   . Diffuse goiter   . Hyperlipidemia   . Hypertension   . Hyperthyroidism   . Obesity hypoventilation syndrome ([place]) 06/20/2013  . Stroke ([place])   . Vascular dementia [facility])     Past Surgical History:  Procedure Laterality Date  . IR GENERIC HISTORICAL  07/12/2016   IR US GUIDE VASC ACCESS RIGHT 07/12/2016 [name], MD [facility]  . IR GENERIC HISTORICAL  07/12/2016   IR FLUORO GUIDE CV LINE RIGHT 07/12/2016 [name], MD [facility]  . IR GENERIC HISTORICAL  07/17/2016   IR GASTROSTOMY TUBE MOD SED 07/17/2016 [name], MD [facility]  . IR GENERIC HISTORICAL  08/23/2016   IR REMOVAL TUN CV CATH W/O FL 08/23/2016 [name], MD [facility]  . IR  GENERIC HISTORICAL  11/09/2016   IR GASTROSTOMY TUBE REMOVAL 11/09/2016 [name], PA-C [facility]  . TEE WITHOUT CARDIOVERSION N/A 05/06/2018   Procedure: TRANSESOPHAGEAL ECHOCARDIOGRAM (TEE);  Surgeon: Dorothy Spark, MD;  Location: [facility] ENDOSCOPY;  Service: Cardiovascular;  Laterality: N/A;  . TRACHEOSTOMY TUBE PLACEMENT N/A 07/14/2016   Procedure: TRACHEOSTOMY, THYROID ISTHMUSECTOMY;  Surgeon: [name], MD;  Location: [facility] OR;  Service: ENT;  Laterality: N/A;  . VENTRAL HERNIA REPAIR      Family History  Problem Relation Age of Onset  . Diabetes Mother   . Cancer Neg Hx   . Heart disease Neg Hx   . Stroke Neg Hx    Social History:  reports that she quit smoking about 16 years ago. Her smoking use included cigarettes. She has a 7.50 pack-year smoking  history. She has never used smokeless tobacco. She reports that she does not drink alcohol or use drugs.  Allergies:  Allergies  Allergen Reactions  . Penicillins Swelling    FACIAL SWELLING Has patient had a PCN reaction causing immediate rash, facial/tongue/throat swelling, SOB or lightheadedness with hypotension: Yes Has patient had a PCN reaction causing severe rash involving mucus membranes or skin necrosis: No Has patient had a PCN reaction that required hospitalization: No Has patient had a PCN reaction occurring within the last 10 years: No If all of the above answers are "NO", then may proceed with Cephalosporin use.   . Lactose Intolerance (Gi) Diarrhea and Nausea And Vomiting    Medications:                                                                                                                        I reviewed home medications   ROS:                                                                                                                                   unable to obtain due to mental status   Examination:                                                                                                       General: Appears well-developed and well-nourished.  Psych: Affect appropriate to situation Eyes: No scleral injection HENT: No OP obstrucion Head: Normocephalic.  Cardiovascular: Normal rate and regular rhythm.  Respiratory: Effort normal and breath sounds normal to anterior ascultation GI: Soft.  No distension. There is no tenderness.  Skin: WDI    Neurological Examination Mental Status: Patient does not respond to verbal stimuli.  Does not respond to deep sternal rub.  Does not follow commands.  No verbalizations noted.  Cranial Nerves: II: patient does not respond confrontation bilaterally, pupils are 2 mm in the right and 3 mm in the left, reactive III,IV,VI: doll's response present V,VII: corneal reflex:  Present VIII: patient does not respond to verbal stimuli IX,X: gag reflex present XI: trapezius strength unable to test bilaterally XII: tongue strength unable to test Motor: Patient has decerebrate posturing Sensory: Unable to assess Deep Tendon Reflexes:  Absent throughout. Plantars: mute Cerebellar: Unable to perform   Lab Results: Basic Metabolic Panel: Recent Labs  Lab 10/23/2018 12-24-2047 10/01/2018 2301-12-23 10/20/18 0031  NA 136 137  --   K 4.2 3.4*  --   CL 102  --   --   CO2 23  --   --   GLUCOSE 235*  --   --   BUN 19  --   --   CREATININE 1.49*  --   --   CALCIUM 9.0  --   --   MG  --   --  1.5*  PHOS  --   --  3.7    CBC: Recent Labs  Lab 10/10/2018 2047-12-24 10/21/2018 23-Dec-2301  WBC 17.9*  --   NEUTROABS 11.8*  --   HGB 9.4* 9.2*  HCT 33.3* 27.0*  MCV 87.4  --   PLT 253  --     Coagulation Studies: Recent Labs    10/23/2018 12-24-2047  LABPROT 13.7  INR 1.06    Imaging: Ct Head Wo Contrast  Result Date: 10/23/2018 CLINICAL DATA:  Found unresponsive EXAM: CT HEAD WITHOUT CONTRAST TECHNIQUE: Contiguous axial images were obtained from the base of the skull through the vertex without intravenous contrast. COMPARISON:  05/07/2018 FINDINGS: Brain:  There is no mass, hemorrhage or extra-axial collection. There is generalized atrophy without lobar predilection. Hypodensity of the white matter is most commonly associated with chronic microvascular disease. Multiple old deep gray lacunar infarcts. Vascular: Atherosclerotic calcification of the vertebral and internal carotid arteries at the skull base. No abnormal hyperdensity of the major intracranial arteries or dural venous sinuses. Skull: The visualized skull base, calvarium and extracranial soft tissues are normal. Sinuses/Orbits: No fluid levels or advanced mucosal thickening of the visualized paranasal sinuses. No mastoid or middle ear effusion. The orbits are normal. IMPRESSION: Atrophy and chronic ischemic microangiopathy without acute intracranial abnormality. Electronically Signed   By: Ulyses Jarred M.D.   On: 10/24/2018 21:51   Dg Chest Port 1 View  Result Date: 10/20/2018 CLINICAL DATA:  ET tube present, concern for tube movement, low tidal volumes EXAM: PORTABLE CHEST 1 VIEW COMPARISON:  Radiograph earlier this day. FINDINGS: Endotracheal tube tip is at the thoracic inlet 4.9 cm from the carina. Enteric tube in place with tip below the diaphragm, side-port not well visualized. Mild cardiomegaly. Coronary stent versus calcification. Improving left lung base aeration. No pulmonary edema, large pleural effusion or pneumothorax. IMPRESSION: 1. Endotracheal tube tip at the thoracic inlet 4.9 cm from the carina. 2. Improving left lung base aeration.  Borderline cardiomegaly. Electronically Signed   By: Keith Rake M.D.   On: 10/20/2018 00:12   Dg Chest Portable 1 View  Result Date: 10/05/2018 CLINICAL DATA:  Status post endotracheal tube adjustment EXAM: PORTABLE CHEST 1 VIEW COMPARISON:  Film from earlier in the same day. FINDINGS: The endotracheal tube has been withdrawn and now lies just above the level of the carina. Persistent left basilar opacity is noted. No other focal abnormality is  seen. IMPRESSION: Interval withdrawal of the endotracheal tube to just above the carina. Persistent left basilar opacity is noted. Critical Value/emergent results were called by telephone at the time of interpretation on 10/20/2018 at 9:26 pm to Dr. Marda Stalker , who verbally  acknowledged these results. Electronically Signed   By: Inez Catalina M.D.   On: 10/16/2018 21:26   Dg Chest Portable 1 View  Result Date: 10/22/2018 CLINICAL DATA:  Status post CPR EXAM: PORTABLE CHEST 1 VIEW COMPARISON:  05/15/2018 FINDINGS: Endotracheal tube is noted deep within the right mainstem bronchus. This should be withdrawn at least 4 cm. Nasogastric catheter is noted. Lungs are hypoinflated. Mild left basilar atelectasis is noted. IMPRESSION: Hypoinflation. Tubes and lines as described above. The endotracheal tube is deep in the right mainstem bronchus and should be withdrawn several cm. Left basilar opacity is noted. By history, there has been significant vomiting in this may represent aspiration. Continued follow-up is recommended. Critical Value/emergent results were called by telephone at the time of interpretation on 10/08/2018 at 9:25 pm to Dr. Marda Stalker , who verbally acknowledged these results. Electronically Signed   By: Inez Catalina M.D.   On: 10/11/2018 21:25     I have reviewed the above imaging ; CT head with no acute abnormality   ASSESSMENT AND PLAN  68 year old female with vascular dementia, bedbound at baseline with history of coronary artery disease and just of heart failure admitted after PEA arrest. Neurology consulted for concern of seizure-like activity.  Patient appears to be showing at the tube, and has episodes of up rolling of eyes and whole-body jerking lasting for 1 to 2 seconds every 10 seconds.  This is concerning for a very poor prognosis.  Will get stat EEG.  Also recommend getting head CT as this may help with prognostication and decision against pursuing aggressive  seizure suppression.   Anoxic brain injury Possible Myoclonic Status epilepticus Coma on presentation  Recommendations -Stat EEG ordered, informed EEG technician -Restart propofol -Loaded with Keppra 1.5 g -Stat Head CT to evaluate degree of anoxic injury  Completing shift.  Discussed the case with Dr. Cheral Marker will be following up on EEG and making further decisions.    This patient is neurologically critically ill due to the brain injury and possible status epilepticus.  She is at risk for significant risk of neurological worsening from cerebral edema,  death from brain herniation, heart failure, infection, respiratory failure and seizures. This patient's care requires constant monitoring of vital signs, hemodynamics, respiratory and cardiac monitoring, review of multiple databases, neurological assessment, discussion with family, other specialists and medical decision making of high complexity.  I spent 50  minutes of neurocritical time in the care of this patient.        [facility] Pager Number [phone]

## 2018-10-20 NOTE — Progress Notes (Signed)
[facility] Progress Note Patient Name: Angelica Beck DOB: 1951-05-06 MRN: [id number]   Date of Service  10/20/2018  HPI/Events of Note  Hypoglycemia - Blood glucose = 67. Patient is on [facility] resistant Norvolog SSI.   eICU Interventions  Will order: 1. Change to Q 4 hour sensitive Novolog SSI.      Intervention Category Major Interventions: Other:  Lysle Dingwall 10/20/2018, 11:00 PM

## 2018-10-20 NOTE — Progress Notes (Signed)
vLTM EEG running/ no skin breakdown/ notified neuro

## 2018-10-20 NOTE — Progress Notes (Addendum)
Patient reassessed. BP has improved.   BP (!) 94/48   Pulse (!) 101   Temp 98.4 F (36.9 C)   Resp (!) 22   Ht 5\' 5"  (1.651 m)   Wt 95 kg Comment: from Aug 2019 records, please update!  SpO2 100%   BMI 34.85 kg/m   Exam reveals continued twitching of right toe.   LTM EEG reviewed. The findings are consistent with myoclonic status epilepticus.  A/R: Myoclonic status in the setting of anoxic brain injury 1. Start Versed gtt at 9 mg/hr 2. Continue propofol at rate of 25  3. Continue Keppra and valproic acid.   10 additional minutes of critical care time.   Electronically signed: Dr. Kerney Elbe

## 2018-10-20 NOTE — Progress Notes (Signed)
Pharmacy Antibiotic Note  Kaiyah Eber is a 68 y.o. female admitted on 10/21/2018 with s/p CPR.  Pharmacy has been consulted for vancomycin and zosyn dosing.  Plan: Zosyn 3.375g IV q8h (4 hour infusion).  Vancomycin 1gm in ED then 1250 mg IV q24 hours F/u renal function, cultures and clinical course  Height: 5\' 5"  (165.1 cm) IBW/kg (Calculated) : 57  Temp (24hrs), Avg:90.3 F (32.4 C), Min:89.6 F (32 C), Max:92.1 F (33.4 C)  Recent Labs  Lab 10/13/2018 2045 10/09/2018 2049 10/20/18 0031  WBC  --  17.9*  --   CREATININE  --  1.49*  --   LATICACIDVEN 4.8*  --  1.8    CrCl cannot be calculated (Unknown ideal weight.).    Allergies  Allergen Reactions  . Penicillins Swelling    FACIAL SWELLING Has patient had a PCN reaction causing immediate rash, facial/tongue/throat swelling, SOB or lightheadedness with hypotension: Yes Has patient had a PCN reaction causing severe rash involving mucus membranes or skin necrosis: No Has patient had a PCN reaction that required hospitalization: No Has patient had a PCN reaction occurring within the last 10 years: No If all of the above answers are "NO", then may proceed with Cephalosporin use.   . Lactose Intolerance (Gi) Diarrhea and Nausea And Vomiting    Thank you for allowing pharmacy to be a part of this patient's care.  Excell Seltzer Poteet 10/20/2018 3:50 AM

## 2018-10-20 NOTE — Progress Notes (Signed)
[place] Progress Note Patient Name: Angelica Beck DOB: 04-07-1951 MRN: [id number]   Date of Service  10/20/2018  HPI/Events of Note  Call from bedside nurse reporting onset of seizure type activity.  Camera check shows intubated patient with rhythmic chewing of ETT, eye rolled back and periodic jerking movements  eICU Interventions  Plan (discussed with Neuro) 4 mg ativan IV times one Re-start propofol One time load of keppra 1500 mg Neuro to see patient today     Intervention Category Major Interventions: Seizures - evaluation and management  Jevaeh Shams 10/20/2018, 6:13 AM

## 2018-10-21 ENCOUNTER — Inpatient Hospital Stay (HOSPITAL_COMMUNITY): Payer: Medicare Other

## 2018-10-21 DIAGNOSIS — I469 Cardiac arrest, cause unspecified: Secondary | ICD-10-CM

## 2018-10-21 DIAGNOSIS — G40901 Epilepsy, unspecified, not intractable, with status epilepticus: Secondary | ICD-10-CM

## 2018-10-21 DIAGNOSIS — G931 Anoxic brain damage, not elsewhere classified: Secondary | ICD-10-CM

## 2018-10-21 LAB — BLOOD CULTURE ID PANEL (REFLEXED)
Acinetobacter baumannii: NOT DETECTED
Candida albicans: NOT DETECTED
Candida glabrata: NOT DETECTED
Candida krusei: NOT DETECTED
Candida parapsilosis: NOT DETECTED
Candida tropicalis: NOT DETECTED
Enterobacter cloacae complex: NOT DETECTED
Enterobacteriaceae species: NOT DETECTED
Enterococcus species: NOT DETECTED
Escherichia coli: NOT DETECTED
Haemophilus influenzae: NOT DETECTED
Klebsiella oxytoca: NOT DETECTED
Klebsiella pneumoniae: NOT DETECTED
Listeria monocytogenes: NOT DETECTED
NEISSERIA MENINGITIDIS: NOT DETECTED
Proteus species: NOT DETECTED
Pseudomonas aeruginosa: NOT DETECTED
STREPTOCOCCUS AGALACTIAE: NOT DETECTED
Serratia marcescens: NOT DETECTED
Staphylococcus aureus (BCID): NOT DETECTED
Staphylococcus species: NOT DETECTED
Streptococcus pneumoniae: NOT DETECTED
Streptococcus pyogenes: NOT DETECTED
Streptococcus species: NOT DETECTED

## 2018-10-21 LAB — GLUCOSE, CAPILLARY
GLUCOSE-CAPILLARY: 85 mg/dL (ref 70–99)
Glucose-Capillary: 119 mg/dL — ABNORMAL HIGH (ref 70–99)
Glucose-Capillary: 78 mg/dL (ref 70–99)
Glucose-Capillary: 84 mg/dL (ref 70–99)
Glucose-Capillary: 85 mg/dL (ref 70–99)
Glucose-Capillary: 91 mg/dL (ref 70–99)
Glucose-Capillary: 93 mg/dL (ref 70–99)

## 2018-10-21 LAB — CBC
HEMATOCRIT: 21.9 % — AB (ref 36.0–46.0)
Hemoglobin: 7 g/dL — ABNORMAL LOW (ref 12.0–15.0)
MCH: 26.1 pg (ref 26.0–34.0)
MCHC: 32 g/dL (ref 30.0–36.0)
MCV: 81.7 fL (ref 80.0–100.0)
Platelets: 162 10*3/uL (ref 150–400)
RBC: 2.68 MIL/uL — ABNORMAL LOW (ref 3.87–5.11)
RDW: 14.9 % (ref 11.5–15.5)
WBC: 13 10*3/uL — ABNORMAL HIGH (ref 4.0–10.5)
nRBC: 0 % (ref 0.0–0.2)

## 2018-10-21 LAB — COMPREHENSIVE METABOLIC PANEL
ALT: 88 U/L — ABNORMAL HIGH (ref 0–44)
AST: 97 U/L — ABNORMAL HIGH (ref 15–41)
Albumin: 3 g/dL — ABNORMAL LOW (ref 3.5–5.0)
Alkaline Phosphatase: 76 U/L (ref 38–126)
Anion gap: 13 (ref 5–15)
BILIRUBIN TOTAL: 0.6 mg/dL (ref 0.3–1.2)
BUN: 25 mg/dL — ABNORMAL HIGH (ref 8–23)
CO2: 22 mmol/L (ref 22–32)
Calcium: 8.4 mg/dL — ABNORMAL LOW (ref 8.9–10.3)
Chloride: 100 mmol/L (ref 98–111)
Creatinine, Ser: 1.83 mg/dL — ABNORMAL HIGH (ref 0.44–1.00)
GFR calc Af Amer: 33 mL/min — ABNORMAL LOW (ref >60)
GFR calc non Af Amer: 28 mL/min — ABNORMAL LOW (ref >60)
Glucose, Bld: 82 mg/dL (ref 70–99)
Potassium: 3.9 mmol/L (ref 3.5–5.1)
Sodium: 135 mmol/L (ref 135–145)
TOTAL PROTEIN: 6 g/dL — AB (ref 6.5–8.1)

## 2018-10-21 LAB — PHOSPHORUS
Phosphorus: 4 mg/dL (ref 2.5–4.6)
Phosphorus: 4.2 mg/dL (ref 2.5–4.6)

## 2018-10-21 LAB — MAGNESIUM
Magnesium: 2.1 mg/dL (ref 1.7–2.4)
Magnesium: 2.1 mg/dL (ref 1.7–2.4)

## 2018-10-21 LAB — CORTISOL: Cortisol, Plasma: 6.8 ug/dL

## 2018-10-21 MED ORDER — ADULT MULTIVITAMIN LIQUID CH
15.0000 mL | Freq: Every day | ORAL | Status: DC
  Administered 2018-10-21 – 2018-10-25 (×5): 15 mL
  Filled 2018-10-21 (×5): qty 15

## 2018-10-21 MED ORDER — SODIUM CHLORIDE 0.9 % IV SOLN
200.0000 mg | Freq: Once | INTRAVENOUS | Status: AC
  Administered 2018-10-21: 200 mg via INTRAVENOUS
  Filled 2018-10-21: qty 20

## 2018-10-21 MED ORDER — VITAL HIGH PROTEIN PO LIQD
1000.0000 mL | ORAL | Status: DC
  Administered 2018-10-21 – 2018-10-24 (×4): 1000 mL

## 2018-10-21 MED ORDER — PRO-STAT SUGAR FREE PO LIQD
60.0000 mL | Freq: Three times a day (TID) | ORAL | Status: DC
  Administered 2018-10-21 – 2018-10-25 (×12): 60 mL
  Filled 2018-10-21 (×13): qty 60

## 2018-10-21 MED ORDER — ADULT MULTIVITAMIN W/MINERALS CH: 1.0000 | ORAL_TABLET | Freq: Every day | ORAL | Status: DC

## 2018-10-21 MED ORDER — MAGNESIUM SULFATE IN D5W 1-5 GM/100ML-% IV SOLN
1.0000 g | Freq: Once | INTRAVENOUS | Status: AC
  Administered 2018-10-21: 1 g via INTRAVENOUS
  Filled 2018-10-21: qty 100

## 2018-10-21 MED ORDER — DEXTROSE-NACL 5-0.45 % IV SOLN
INTRAVENOUS | Status: DC
  Administered 2018-10-21 – 2018-10-22 (×2): via INTRAVENOUS

## 2018-10-21 MED ORDER — SODIUM CHLORIDE 0.9 % IV SOLN
100.0000 mg | Freq: Two times a day (BID) | INTRAVENOUS | Status: DC
  Administered 2018-10-21 – 2018-10-26 (×10): 100 mg via INTRAVENOUS
  Filled 2018-10-21 (×14): qty 10

## 2018-10-21 MED ORDER — SODIUM CHLORIDE 0.9 % IV SOLN
2.0000 g | Freq: Once | INTRAVENOUS | Status: DC
  Administered 2018-10-21: 2 g via INTRAVENOUS
  Filled 2018-10-21: qty 20

## 2018-10-21 NOTE — Procedures (Signed)
  Electroencephalogram report- LTM with VIDEO  Ordering Physician : Dr. Leonel Ramsay  Beginning time: 10/20/2018 at 75 Ending time: 10/21/2018 at 07 30 CPT/type : 95720  Day of study: Day 1   Technical Description: The EEG was performed using standard setting per the guidelines of American Clinical Neurophysiology Society (ACNS).    A minimum of 21 electrodes were placed on scalp according to the International 10-20 or/and 10-10 Systems. Supplemental electrodes were placed as needed. Single EKG electrode was also used to detect cardiac arrhythmia. Patient's behavior was continuously recorded on video simultaneously with EEG. A minimum of 16 channels were used for data display. Each epoch of study was reviewed manually daily and as needed using standard referential and bipolar montages. Computerized quantitative EEG analysis (such as compressed spectral array analysis, trending, automated spike & seizure detection) were used as indicated.    Spike detection: ON  Seizure detection: ON   This 19 hours of continuous  EEG monitoring with simultaneous video monitoring was performed for this patient with convulsions to rule out clinical and subclinical geographic seizures.  Medications: Intubated and sedated  As recording begins background activities marked by burst and suppression pattern with a 3-3 ratio.  Bursts of cerebral activities marked by a mixture of frequencies with superimposed independent left and right frontotemporal spikes.  As recording progressed background activities become more continuous background activity slowing in the delta and theta range.  Superimposed near continuous 1 cps sharp waves and spikes independently in the left and the right temporal region present with maximum negativity at F7, T3, T4, T4 -T6, F4- C4 and Fp2.  Around 3 43 am prolonged  electrographic seizure was present marked by continues evolving waxing and waning spike and wave discharges present independently  in a left and right anterior temporal region with negative field extending to the frontal cortex more prominent across right hemisphere.  Pushbutton was activated around that time but is not clear what was the clinical manifestations.  Event button was activated by staff.  Clinical interpretation: This 19  hours of continuous EEG monitoring with simultaneous video monitoring is abnormal and demonstrate burst suppression pattern evolving into more continuous background activities as discussed above.  Superimposed frequent near continuous independent left and right frontotemporal sharp waves and spikes as well electrographic seizure was present as discussed above.  This finding suggestive of encephalopathy and significant cortical irritability likely involving independently both hemispheres with multifocal area of epileptogenicity.  Continuous monitoring is recommended to ensure resolution of electrographic seizures.  Clinical correlation is advised.

## 2018-10-21 NOTE — Progress Notes (Signed)
Initial Nutrition Assessment  DOCUMENTATION CODES:   Obesity unspecified  INTERVENTION:   Tube Feeding:  Vital High Protein @ 20 ml/hr Pro-Stat 60 mL TID Provides 1040 kcals, 132 g of protein and 403 mL of free water  TF regimen and propofol at current rate providing 1710 total kcal/day (129 % of kcal needs)  Add MVI with minerals  NUTRITION DIAGNOSIS:   Inadequate oral intake related to acute illness as evidenced by NPO status.  GOAL:   Provide needs based on ASPEN/SCCM guidelines  MONITOR:   Vent status, TF tolerance, Labs, Weight trends, Skin  REASON FOR ASSESSMENT:   Ventilator    ASSESSMENT:   68 yo female admitted post cardiac arrest, status epilepticus, acute respiratory failure with inability to protect airway requiring intubation.Marland Kitchen PMH includes vascular dementia, CKD III, HTN, CHF. Pt is bed bound, resides with her daughter   Patient is currently intubated on ventilator support, currently on LTM EEG, sedated on propofol and versed MV: 6.7 L/min Temp (24hrs), Avg:97.8 F (36.6 C), Min:96.4 F (35.8 C), Max:99.3 F (37.4 C)  Propofol: 27.9 ml/hr (670 kcals in 24 hours)  OG tube in stomach; abdomen obese, soft.   Pt lives with her Daughter; Daughter is at bedside. Daughter reports UBW around 220 pounds, current wt 210 pounds. Pt weighed 217 pounds in August per weight encounters. Pt is bedbound, 1-2 times per month daughter gets pt into wheelchair using a lift and takes her out.   Daughter reports pt had been eating well PTA. Pt eating 5-6 small meals per day; no supplements. Daughter reports pt with hx of feeding tube (g-tube placed in 2017) which she had for at least 6-7 months before transitioning to oral diet. G-tube has been removed  Labs: Creatinine 1.83, BUN 25, CBGs 67-130 Meds: D5-1/2 NS at 50 ml/hr, ss novolog, mag sulfate  NUTRITION - FOCUSED PHYSICAL EXAM:    Most Recent Value  Orbital Region  No depletion  Upper Arm Region  No depletion   Thoracic and Lumbar Region  No depletion  Buccal Region  No depletion  Temple Region  No depletion  Clavicle Bone Region  No depletion  Clavicle and Acromion Bone Region  No depletion  Scapular Bone Region  No depletion  Dorsal Hand  Unable to assess  Patellar Region  Mild depletion  Anterior Thigh Region  Mild depletion  Posterior Calf Region  Mild depletion  Edema (RD Assessment)  Moderate       Diet Order:   Diet Order            Diet NPO time specified  Diet effective now              EDUCATION NEEDS:   Not appropriate for education at this time  Skin:  Skin Assessment: Reviewed RN Assessment(no pressure ulcers noted)  Last BM:  1/26  Height:   Ht Readings from Last 1 Encounters:  10/21/18 5\' 7"  (1.702 m)    Weight:   Wt Readings from Last 1 Encounters:  10/20/18 95 kg    Ideal Body Weight:  61.4 kg  BMI:  Body mass index is 32.8 kg/m.  Estimated Nutritional Needs:   Kcal:  1045-1330 kcals   Protein:  123-135 g  Fluid:  >/= 1.5 L   Kerman Passey MS, RD, LDN, CNSC [phone] Pager  [phone] Weekend/On-Call Pager

## 2018-10-21 NOTE — Progress Notes (Addendum)
Neurology Progress Note   S:// Seen and examined. LTM overnight - report pending   O:// Current vital signs: BP (!) 103/49   Pulse 78   Temp 97.7 F (36.5 C)   Resp 15   Ht 5' 5" (1.651 m)   Wt 95 kg Comment: from Aug 2019 records, please update!  SpO2 100%   BMI 34.85 kg/m  Vital signs in last 24 hours: Temp:  [95.5 F (35.3 C)-98.8 F (37.1 C)] 97.7 F (36.5 C) (01/27 0813) Pulse Rate:  [78-107] 78 (01/27 0813) Resp:  [15-25] 15 (01/27 0813) BP: (78-132)/(37-65) 103/49 (01/27 0700) SpO2:  [99 %-100 %] 100 % (01/27 0813) FiO2 (%):  [40 %] 40 % (01/27 0813) GENERAL: sedated Versed/Propofol, intubated HEENT: - Normocephalic and atraumatic, dry mm, no LN++, no Thyromegally LUNGS - Clear to auscultation bilaterally with no wheezes CV - S1S2 RRR, no m/r/g, equal pulses bilaterally. ABDOMEN - Soft, nontender, nondistended with normoactive BS Ext: warm, well perfused NEURO:  Mental Status: Sedated on propofol and versed. Intubated. Propofol held shortly for exam Language: does not follow commands and is non verbal. Cranial Nerves: Pupils pinpoint and minimally reactive to light if any. +OCR, facial symmetry difficult to ascertain due to ETT Motor: No spontaneous movement noted. On nox stim, left LE showed some triple flexion. On nox stim to traps, she has rhythmic shaking of both hands that stops after nox stim is removed. Tone: is flaccid all over Sensation- as above Coordination and gait- can not be ascertained due to mental status   Medications  Current Facility-Administered Medications:  .  0.9 %  sodium chloride infusion, 250 mL, Intravenous, Continuous, Olalere, Adewale A, MD, Last Rate: 10 mL/hr at 10/21/18 0700 .  atorvastatin (LIPITOR) tablet 40 mg, 40 mg, Oral, q1800, Caffie Damme, MD, 40 mg at 10/20/18 1745 .  chlorhexidine gluconate (MEDLINE KIT) (PERIDEX) 0.12 % solution 15 mL, 15 mL, Mouth Rinse, BID, Aljishi, Virgina Norfolk, MD, 15 mL at 10/21/18 0800 .   dextrose 5 %-0.45 % sodium chloride infusion, , Intravenous, Continuous, Agyei, Obed K, MD, Last Rate: 50 mL/hr at 10/21/18 0810 .  fentaNYL (SUBLIMAZE) injection 50 mcg, 50 mcg, Intravenous, Q15 min PRN, Deterding, Guadelupe Sabin, MD .  fentaNYL (SUBLIMAZE) injection 50 mcg, 50 mcg, Intravenous, Q2H PRN, Deterding, Guadelupe Sabin, MD .  insulin aspart (novoLOG) injection 0-9 Units, 0-9 Units, Subcutaneous, Q4H, Anders Simmonds, MD .  ipratropium-albuterol (DUONEB) 0.5-2.5 (3) MG/3ML nebulizer solution 3 mL, 3 mL, Nebulization, Q4H PRN, Caffie Damme, MD, 3 mL at 10/20/18 0756 .  levETIRAcetam (KEPPRA) IVPB 1500 mg/ 100 mL premix, 1,500 mg, Intravenous, Q12H, Kerney Elbe, MD, Last Rate: 400 mL/hr at 10/21/18 0700, 1,500 mg at 10/21/18 0700 .  magnesium sulfate IVPB 1 g 100 mL, 1 g, Intravenous, Once, Agyei, Obed K, MD, Last Rate: 100 mL/hr at 10/21/18 0816, 1 g at 10/21/18 0816 .  MEDLINE mouth rinse, 15 mL, Mouth Rinse, 10 times per day, Aldean Jewett, MD, 15 mL at 10/21/18 0213 .  methimazole (TAPAZOLE) tablet 10 mg, 10 mg, Per Tube, Daily, Maslonka, Matthew A, MD .  midazolam (VERSED) 87m in NS 522m(78m55ml) premix infusion, 9 mg/hr, Intravenous, Continuous, LinKerney ElbeD, Last Rate: 9 mL/hr at 10/21/18 0804, 9 mg/hr at 10/21/18 0804 .  midazolam (VERSED) injection 1 mg, 1 mg, Intravenous, Q2H PRN, Deterding, EliGuadelupe SabinD .  pantoprazole (PROTONIX) injection 40 mg, 40 mg, Intravenous, QHS, Maslonka, MatSheral ApleyD, 40 mg at 10/20/18  2117 .  phenylephrine (NEOSYNEPHRINE) 10-0.9 MG/250ML-% infusion, 0-400 mcg/min, Intravenous, Titrated, Olalere, Adewale A, MD .  propofol (DIPRIVAN) 1000 MG/100ML infusion, 5-80 mcg/kg/min, Intravenous, Titrated, Deterding, Guadelupe Sabin, MD, Last Rate: 28.5 mL/hr at 10/21/18 0728, 50 mcg/kg/min at 10/21/18 0728 .  valproate (DEPACON) 475 mg in dextrose 5 % 50 mL IVPB, 15 mg/kg/day, Intravenous, Q8H, Kerney Elbe, MD, Last Rate: 54.8 mL/hr at 10/21/18 0707,  475 mg at 10/21/18 0707 Labs CBC    Component Value Date/Time   WBC 13.0 (H) 10/21/2018 0352   RBC 2.68 (L) 10/21/2018 0352   HGB 7.0 (L) 10/21/2018 0352   HCT 21.9 (L) 10/21/2018 0352   HCT 26.8 (L) 06/27/2016 1536   PLT 162 10/21/2018 0352   MCV 81.7 10/21/2018 0352   MCH 26.1 10/21/2018 0352   MCHC 32.0 10/21/2018 0352   RDW 14.9 10/21/2018 0352   LYMPHSABS 0.5 (L) 10/20/2018 0604   MONOABS 0.8 10/20/2018 0604   EOSABS 0.0 10/20/2018 0604   BASOSABS 0.0 10/20/2018 0604    CMP     Component Value Date/Time   NA 135 10/21/2018 0352   NA 141 04/09/2017   K 3.9 10/21/2018 0352   CL 100 10/21/2018 0352   CO2 22 10/21/2018 0352   GLUCOSE 82 10/21/2018 0352   BUN 25 (H) 10/21/2018 0352   BUN 37 (A) 04/09/2017   CREATININE 1.83 (H) 10/21/2018 0352   CALCIUM 8.4 (L) 10/21/2018 0352   PROT 6.0 (L) 10/21/2018 0352   ALBUMIN 3.0 (L) 10/21/2018 0352   AST 97 (H) 10/21/2018 0352   ALT 88 (H) 10/21/2018 0352   ALKPHOS 76 10/21/2018 0352   BILITOT 0.6 10/21/2018 0352   GFRNONAA 28 (L) 10/21/2018 0352   GFRAA 33 (L) 10/21/2018 0352   Imaging I have reviewed images in epic and the results pertinent to this consultation are: CT-scan of the brain - no acute changes.  Assessment: 67/F with baseline vascular dementia, CAD, CHF admitted after PEA arrest. Neurological consultation obtained for seizure like activity. Stat EEG yesterday AM suggestive of myoclonic status epilepticus. Loaded with Keppra and Depakote and started on standing doses of Keppra and Depakote and also on Versed (28m/hr) drip and Propofol drip (551m/kg/min) Currently on LTM EEG - result pending. On exam, seemed to have some brainstem reflexes present and bilateral UE tremulousness on stimulation. EEG with frequent rhythmic sharps on both hemispheres (right worse than left) on prelim read.  Impression: Anoxic/Hypoxic encephalopathy Coma Status epilepticus  Recommendations: Increase Propofol to  6043mkg/min Increase versed to 20 mg/hr Add Vimpat (200m2m now followed by 100 mg IV BID) Continue with Keppra 1500 BID and Depakote 475mg42m Check LFTs and ammonia level. MRI brain when able (would wait 46-72h from initial arrest) Given poor baseline reserve, and myoclonic status epilepticus following PEA arrest, I would not expect a good neurological outcome but would at least give 48-72 hours from initial event for firmer prognostication.  No family at bedside. Will communicate plan to ICU team.   -- AshisAmie PortlandTri[facility] Neurohospitalist Pager: [phone] to 7am, please call on call as listed on AMION.  CRITICAL CARE ATTESTATION Performed by: AshisAmie PortlandTotal critical care time:35 minutes Critical care time was exclusive of separately billable procedures and treating other patients and/or supervising APPs/Residents/Students Critical care was necessary to treat or prevent imminent or life-threatening deterioration due to hypoxic/anoxic brain injury, PEA arrest, myoclonic status epilepticus, Coma. This patient is critically ill and at significant risk for neurological worsening  and/or death and care requires constant monitoring. Critical care was time spent personally by me on the following activities: development of treatment plan with patient and/or surrogate as well as nursing, discussions with consultants, evaluation of patient's response to treatment, examination of patient, obtaining history from patient or surrogate, ordering and performing treatments and interventions, ordering and review of laboratory studies, ordering and review of radiographic studies, pulse oximetry, re-evaluation of patient's condition, participation in multidisciplinary rounds and medical decision making of high complexity in the care of this patient.  

## 2018-10-21 NOTE — Progress Notes (Addendum)
NAME:  Simaya Lumadue, MRN:  [id number], DOB:  [birth year], LOS: 1 ADMISSION DATE:  10/14/2018, CONSULTATION DATE: October 19, 2018 REFERRING MD:  Dr. Sherry Ruffing, CHIEF COMPLAINT: Cardiac arrest  Brief History   Ms. Dinunzio is a 68 year old woman with vascular dementia, history of stroke, CKD stage III, hypertension, hypothyroidism, diastolic heart failure, history of GI bleed, chronic hypoxia on nocturnal oxygen and nonambulatory at baseline who presented following cardiac arrest.  She achieve ROSC after 7 to 12 minutes of CPR and epinephrine.  On arrival, bedside EEG was consistent with status epilepticus.  Past Medical History  Vascular dementia History of stroke CKD stage III Hypertension Hypothyroidism Diastolic heart failure History of GI bleed Chronic hypoxia on nocturnal oxygen [facility] Events   1/26 admit>> 1/26 EMS intubation >> 1/26 EEG >> generalized status epilepticus  Consults:  Neurology  Procedures:  1/26 admit>>  Significant Diagnostic Tests:  1/25 CT head>> unremarkable 1/26 EEG >> generalized status epilepticus 1/26 LTM EEG >>  Micro Data:  1/25 blood culture>> GP Rods, pending culture and sensitivity  1/25 tracheal aspirate>> 1/25 urine culture>>  Antimicrobials:  1/25 vancomycin>>1/26 1/25 Zosyn>>  Interim history/subjective:  Overnight:  --Hypoglycemic to 60, insulin regimen changed  Today, Ms. Horiuchi remains sedated and not following commands.   Objective   Blood pressure (!) 103/49, pulse 79, temperature (!) 97.3 F (36.3 C), resp. rate 15, height 5\' 5"  (1.651 m), weight 95 kg, SpO2 100 %.    Vent Mode: PRVC FiO2 (%):  [40 %] 40 % Set Rate:  [15 bmp] 15 bmp Vt Set:  [450 mL] 450 mL PEEP:  [5 cmH20] 5 cmH20 Plateau Pressure:  [18 cmH20-19 cmH20] 18 cmH20   Intake/Output Summary (Last 24 hours) at 10/21/2018 7353 Last data filed at 10/21/2018 0700 Gross per 24 hour  Intake 1419.39 ml  Output 1130 ml    Net 289.39 ml   Filed Weights   10/20/18 0300  Weight: 95 kg    Examination: General: Sedated, unresponsive, middle-aged appearing woman HENT: ET tube and OG tube in place Lungs: Diminished breath sounds, no wheezes or crackles Cardiovascular: Tachycardia, no MRG Abdomen: BS+, NTTP, soft  Extremities: Warm to touch, no edema Neuro: Sedated   Resolved Hospital Problem list     Assessment & Plan:  *Ms. Digilio is a 68 year old woman with vascular dementia, history of stroke, CKD stage III, hypertension, hypothyroidism, diastolic heart failure, history of GI bleed, chronic hypoxia on nocturnal oxygen and nonambulatory at baseline who presented following cardiac arrest s/p CPR, epinephrine with ROSC after 7-12 mins. Also found to be in status epilepticus on arrival.   #Myoclonic status epilepticus #Suspecting anoxic brain injury - Continue LTM EEG - Continue Keppra 1500 mg IV BID, Depakote loading then 5 mg/Kg IV TID - Continue propofol for burst suppression, Versed - Appreciate neurology recommendation - Neuro check  #Acute hypoxic/hypoxemic respiratory failure #Inability to protect airway -Secondary to cardiac arrest -Continue full mechanical ventilation -Pulmonary hygiene -Not ready for weaning or SBT -ABG as needed -F/u CXR  #Hypotension - improved  - Multifactorial. secondary to sedation, active warming, decreased adrenergic response from ongoing neurological pathology -Will most likely require central line placement to start pressors -Pressors to keep MAP>65 (On Neo)  #Status post cardiac arrest -Etiology remains unclear.  Given her history of diastolic heart failure, cardiac arrhythmia is possible.  Also an ischemic event also remains a possibility - Telemetry telemetry monitoring -Trend troponin (0.06<0.09<0.09) - Echocardiogram reviewed preserved ejection fraction with EF  55 to 60%, pulmonary hypertension with peak pressure 82mmHg  #Probable aspiration  pneumonia #Low suspicion for sepsis - Initial CXR with left basilar consolidation however repeat chest x-ray was unremarkable with good aeration and low suspicion for consolidation -Ceftriaxone x1 dose.  Lactic acidosis and leukocytosis were most likely secondary to seizure and cardiac arrest. -Blood culture growing GP rods  #Hypothyroidism -Continue Synthroid  #Acute on Chronic kidney disease -Continue to monitor BMP  #Hypomagnesemia --Will replete    Best practice:  Diet: N.p.o. Pain/Anxiety/Delirium protocol (if indicated): Fentanyl, Versed VAP protocol (if indicated): Yes DVT prophylaxis: SCDs GI prophylaxis: IV Protonix Glucose control: Sliding scale insulin Mobility: Bedrest Code Status: Full code Family Communication: Daughter at bedside Disposition: Remain in ICU   Labs   CBC: Recent Labs  Lab 09/29/2018 2049 09/28/2018 2303 10/20/18 0604 10/21/18 0352  WBC 17.9*  --  16.6* 13.0*  NEUTROABS 11.8*  --  15.3*  --   HGB 9.4* 9.2* 8.6* 7.0*  HCT 33.3* 27.0* 28.1* 21.9*  MCV 87.4  --  84.4 81.7  PLT 253  --  223 063    Basic Metabolic Panel: Recent Labs  Lab 10/15/2018 2049 10/23/2018 2303 10/20/18 0031 10/20/18 0604 10/21/18 0352  NA 136 137  --  136 135  K 4.2 3.4*  --  3.5 3.9  CL 102  --   --  103 100  CO2 23  --   --  21* 22  GLUCOSE 235*  --   --  303* 82  BUN 19  --   --  19 25*  CREATININE 1.49*  --   --  1.39* 1.83*  CALCIUM 9.0  --   --  8.3* 8.4*  MG  --   --  1.5*  --   --   PHOS  --   --  3.7  --   --    GFR: Estimated Creatinine Clearance: 34.9 mL/min (A) (by C-G formula based on SCr of 1.83 mg/dL (H)). Recent Labs  Lab 10/25/2018 2045 10/04/2018 2049 10/20/18 0031 10/20/18 0604 10/21/18 0352  WBC  --  17.9*  --  16.6* 13.0*  LATICACIDVEN 4.8*  --  1.8  --   --     Liver Function Tests: Recent Labs  Lab 10/05/2018 2049 10/20/18 0604 10/21/18 0352  AST 314* 257* 97*  ALT 197* 154* 88*  ALKPHOS 134* 110 76  BILITOT 0.7 0.8 0.6   PROT 7.0 5.9* 6.0*  ALBUMIN 2.7* 2.5* 3.0*   Recent Labs  Lab 10/20/18 0031  LIPASE 29   Recent Labs  Lab 10/10/2018 2045  AMMONIA 36*    ABG    Component Value Date/Time   PHART 7.366 10/07/2018 2303   PCO2ART 40.6 10/09/2018 2303   PO2ART 122.0 (H) 10/12/2018 2303   HCO3 23.3 10/17/2018 2303   TCO2 24 10/03/2018 2303   ACIDBASEDEF 2.0 10/06/2018 2303   O2SAT 99.0 10/07/2018 2303     Coagulation Profile: Recent Labs  Lab 10/20/2018 2049  INR 1.06    Cardiac Enzymes: Recent Labs  Lab 10/20/18 0031 10/20/18 0604 10/20/18 1135  TROPONINI 0.09* 0.09* 0.06*    HbA1C: Hemoglobin A1C  Date/Time Value Ref Range Status  03/10/2017 7.4  Final  11/29/2016 6.5  Final    CBG: Recent Labs  Lab 10/20/18 1139 10/20/18 1645 10/20/18 2233 10/20/18 2253 10/21/18 0353  GLUCAP 230* 103* 67* 130* 72   Attending Note:  68 year old female with vascular dementia presenting after PEA cardiac  arrest with 12 minutes downtime.  Patient had an EEG done that showed status and neuro is following.  No events overnight.  On exam, unresponsive with coarse BS.  I reviewed CXR myself, ETT is in a good position.  Discussed with resident.  Will continue propofol and versed, keppra and vimpat.  MRI on Wednesday.  EEG per neuro.  Once neuro is ready to prognosticate then will need further discussion regarding plan of care.  Hg down to 7.  Check BMET and CBC in AM.  Hydrate.  Maintain on full vent support.  PCCM will continue to follow.  The patient is critically ill with multiple organ systems failure and requires high complexity decision making for assessment and support, frequent evaluation and titration of therapies, application of advanced monitoring technologies and extensive interpretation of multiple databases.   Critical Care Time devoted to patient care services described in this note is  32  Minutes. This time reflects time of care of this signee Dr Jennet Maduro. This critical care  time does not reflect procedure time, or teaching time or supervisory time of PA/NP/Med student/Med Resident etc but could involve care discussion time.  Rush Farmer, M.D. [facility] Pulmonary/Critical Care Medicine. Pager: [phone]. After hours pager: [phone].

## 2018-10-21 NOTE — Progress Notes (Signed)
Neuro Dr. Lorraine Lax at bedside to round on pt. Per MD increased propofol rate from 12mcg/kg/min to 50 mcg/kg/min. MD advised of pt's twitching with stimulation during bath resolved with versed boluses.   No new orders at this time. Will continue to monitor.

## 2018-10-22 ENCOUNTER — Inpatient Hospital Stay (HOSPITAL_COMMUNITY): Payer: Medicare Other

## 2018-10-22 DIAGNOSIS — R579 Shock, unspecified: Secondary | ICD-10-CM

## 2018-10-22 LAB — MAGNESIUM
Magnesium: 1.9 mg/dL (ref 1.7–2.4)
Magnesium: 1.9 mg/dL (ref 1.7–2.4)

## 2018-10-22 LAB — BLOOD GAS, ARTERIAL
Acid-base deficit: 3.3 mmol/L — ABNORMAL HIGH (ref 0.0–2.0)
Bicarbonate: 21.7 mmol/L (ref 20.0–28.0)
Drawn by: 317771
FIO2: 40
O2 Saturation: 98.2 %
PCO2 ART: 43 mmHg (ref 32.0–48.0)
PEEP: 5 cmH2O
Patient temperature: 98.6
RATE: 15 resp/min
pH, Arterial: 7.324 — ABNORMAL LOW (ref 7.350–7.450)
pO2, Arterial: 120 mmHg — ABNORMAL HIGH (ref 83.0–108.0)

## 2018-10-22 LAB — AMMONIA: AMMONIA: 51 umol/L — AB (ref 9–35)

## 2018-10-22 LAB — BASIC METABOLIC PANEL
Anion gap: 13 (ref 5–15)
BUN: 34 mg/dL — ABNORMAL HIGH (ref 8–23)
CO2: 20 mmol/L — ABNORMAL LOW (ref 22–32)
Calcium: 7.8 mg/dL — ABNORMAL LOW (ref 8.9–10.3)
Chloride: 100 mmol/L (ref 98–111)
Creatinine, Ser: 1.96 mg/dL — ABNORMAL HIGH (ref 0.44–1.00)
GFR calc Af Amer: 30 mL/min — ABNORMAL LOW (ref >60)
GFR calc non Af Amer: 26 mL/min — ABNORMAL LOW (ref >60)
Glucose, Bld: 115 mg/dL — ABNORMAL HIGH (ref 70–99)
POTASSIUM: 4.1 mmol/L (ref 3.5–5.1)
SODIUM: 133 mmol/L — AB (ref 135–145)

## 2018-10-22 LAB — PHOSPHORUS
Phosphorus: 4.9 mg/dL — ABNORMAL HIGH (ref 2.5–4.6)
Phosphorus: 5.2 mg/dL — ABNORMAL HIGH (ref 2.5–4.6)

## 2018-10-22 LAB — GLUCOSE, CAPILLARY
GLUCOSE-CAPILLARY: 80 mg/dL (ref 70–99)
GLUCOSE-CAPILLARY: 80 mg/dL (ref 70–99)
Glucose-Capillary: 114 mg/dL — ABNORMAL HIGH (ref 70–99)
Glucose-Capillary: 99 mg/dL (ref 70–99)
Glucose-Capillary: 96 mg/dL (ref 70–99)
Glucose-Capillary: 70 mg/dL (ref 70–99)

## 2018-10-22 LAB — CBC
HCT: 22 % — ABNORMAL LOW (ref 36.0–46.0)
Hemoglobin: 6.7 g/dL — CL (ref 12.0–15.0)
MCH: 25.4 pg — ABNORMAL LOW (ref 26.0–34.0)
MCHC: 30.5 g/dL (ref 30.0–36.0)
MCV: 83.3 fL (ref 80.0–100.0)
Platelets: 149 10*3/uL — ABNORMAL LOW (ref 150–400)
RBC: 2.64 MIL/uL — ABNORMAL LOW (ref 3.87–5.11)
RDW: 14.8 % (ref 11.5–15.5)
WBC: 11.5 10*3/uL — ABNORMAL HIGH (ref 4.0–10.5)
nRBC: 0 % (ref 0.0–0.2)

## 2018-10-22 LAB — HEPATIC FUNCTION PANEL
ALT: 55 U/L — ABNORMAL HIGH (ref 0–44)
AST: 78 U/L — ABNORMAL HIGH (ref 15–41)
Albumin: 2.4 g/dL — ABNORMAL LOW (ref 3.5–5.0)
Alkaline Phosphatase: 67 U/L (ref 38–126)
Bilirubin, Direct: 0.1 mg/dL (ref 0.0–0.2)
Indirect Bilirubin: 0.6 mg/dL (ref 0.3–0.9)
Total Bilirubin: 0.7 mg/dL (ref 0.3–1.2)
Total Protein: 5.6 g/dL — ABNORMAL LOW (ref 6.5–8.1)

## 2018-10-22 LAB — PREPARE RBC (CROSSMATCH)

## 2018-10-22 MED ORDER — SODIUM CHLORIDE 0.9% IV SOLUTION: Freq: Once | INTRAVENOUS | Status: DC

## 2018-10-22 MED ORDER — FUROSEMIDE 10 MG/ML IJ SOLN
20.0000 mg | Freq: Four times a day (QID) | INTRAMUSCULAR | Status: AC
  Administered 2018-10-22 (×3): 20 mg via INTRAVENOUS
  Filled 2018-10-22 (×3): qty 2

## 2018-10-22 NOTE — Progress Notes (Addendum)
NAME:Angelica Beck, YNW:[id number], DOB:Jan 30, 1951, LOS:1 ADMISSION DATE:10/06/2018, CONSULTATION DATE:October 19, 2018 REFERRING MD:Dr. Tegeler, CHIEF COMPLAINT:Cardiac arrest  Brief History   Angelica Beck is a 68 year old woman with vascular dementia, history of stroke, CKD stage III, hypertension, hypothyroidism, diastolic heart failure, history of GI bleed, chronic hypoxia on nocturnal oxygen and nonambulatory at baseline who presented following cardiac arrest. She achieve ROSCafter 7 to 12 minutes of CPR and epinephrine.On arrival, bedside EEG was consistent with status epilepticus.  Past Medical History  Vascular dementia History of stroke CKD stage III Hypertension Hypothyroidism Diastolic heart failure History of GI bleed Chronic hypoxia on nocturnal oxygen Nonambulatory  Significant Hospital Events   1/26admit>> 1/26EMS intubation>> 1/26EEG>>generalized status epilepticus  Consults:  Neurology  Procedures:  1/26admit>>  Significant Diagnostic Tests:  1/25 CT head>>unremarkable 1/26EEG>>generalized status epilepticus 1/26LTM EEG>>  Micro Data:  1/25 blood culture>> GP Rods, pending culture and sensitivity  1/25 tracheal aspirate>> 1/25 urine culture>>  Antimicrobials:  1/25 vancomycin>>1/26 1/25 Zosyn>>   Interim history/subjective:  Overnight: -Log Hgb 6.7 -s/p 1U  PRBC  Today, Angelica Beck remains unresponsive.   Objective   Blood pressure (!) 101/51, pulse 83, temperature 99 F (37.2 C), resp. rate 15, height 5\' 7"  (1.702 m), weight 100.7 kg, SpO2 100 %.    Vent Mode: PRVC FiO2 (%):  [40 %] 40 % Set Rate:  [15 bmp] 15 bmp Vt Set:  [450 mL] 450 mL PEEP:  [5 cmH20] 5 cmH20 Plateau Pressure:  [17 cmH20-18 cmH20] 17 cmH20   Intake/Output Summary (Last 24 hours) at 10/22/2018 0701 Last data filed at 10/22/2018 0500 Gross per 24 hour  Intake 2976.78 ml  Output 745 ml  Net 2231.78 ml   Filed Weights   10/20/18  0300 10/22/18 0313  Weight: 95 kg 100.7 kg    Examination: General:Sedated, unresponsive, middle-aged appearing woman HENT:ET tube and OG tube in place, Pupils 1mm minimally reactive to light  Lungs: Coarse breath sounds Cardiovascular:Tachycardia, no MRG Abdomen:BS+, NTTP, soft Extremities:Warm to touch, no edema Neuro:Sedated  Resolved Hospital Problem list     Assessment & Plan:  Angelica Beck is a 68 year old woman with vascular dementia, history of stroke, CKD stage III, hypertension, hypothyroidism, diastolic heart failure, history of GI bleed, chronic hypoxia on nocturnal oxygen and nonambulatory at baseline who presented following cardiac arrests/p CPR, epinephrine with ROSC after 7-12 mins. Alsofound to be in status epilepticus on arrival.   #Myoclonic status epilepticus #Suspecting anoxic brain injury - Continue LTM EEG - Continue Keppra 1500 mg IVBID,Depakote 5 mg/Kg IV TID, Vimpat  - Continue propofol for burst suppression, Versed - Appreciate neurology recommendation - Neuro check - Follow up LFTs, Ammonia   #Acute hypoxic/hypoxemic respiratory failure #Inability to protect airway -Secondary to cardiac arrest -Continue full mechanical ventilation -Pulmonary hygiene -Not ready for weaning or SBT -ABG as needed  -F/u CXR  #Hypotension - improved  -Multifactorial.secondary to sedation,active warming, decreasedadrenergic response from ongoing neurological pathology -Pressors to keep MAP>65 (Neo prn)  #Status post cardiac arrest -Etiology remains unclear. Given her history of diastolic heart failure, cardiac arrhythmia is possible. Also an ischemic event also remains a possibility -Telemetry telemetry monitoring -Trend troponin(0.06<0.09<0.09) - Echocardiogram showed preserved ejection fraction with EF 55 to 60%, pulmonary hypertension with peak pressure26mmHg  #Probable aspiration pneumonia #Low suspicion for sepsis - Initial CXR with  left basilar consolidation however repeat chest x-ray was unremarkable with good aeration and low suspicion for consolidation -Ceftriaxone x1 dose. Lactic acidosis and leukocytosis were most likely secondary to seizure  and cardiac arrest. -Blood culture growing GP rods  #Hypothyroidism -Continue Synthroid  #Acute on Chronic kidney disease -sCr 1.9 <1.8 (Baseline 1.6) -Continue to monitor BMP  #Normocytic normochromic anemia  -Hgb this am 6.7<7<8.6 -Dilutional  -Will transfuse 1U PRBC -Continue to monitor   Labs   CBC: Recent Labs  Lab 10/03/2018 2049 09/30/2018 2303 10/20/18 0604 10/21/18 0352 10/22/18 0530  WBC 17.9*  --  16.6* 13.0* 11.5*  NEUTROABS 11.8*  --  15.3*  --   --   HGB 9.4* 9.2* 8.6* 7.0* 6.7*  HCT 33.3* 27.0* 28.1* 21.9* 22.0*  MCV 87.4  --  84.4 81.7 83.3  PLT 253  --  223 162 149*    Basic Metabolic Panel: Recent Labs  Lab 10/25/2018 2049 10/17/2018 2303 10/20/18 0031 10/20/18 0604 10/21/18 0352 10/21/18 1212 10/21/18 1618 10/22/18 0530  NA 136 137  --  136 135  --   --  133*  K 4.2 3.4*  --  3.5 3.9  --   --  4.1  CL 102  --   --  103 100  --   --  100  CO2 23  --   --  21* 22  --   --  20*  GLUCOSE 235*  --   --  303* 82  --   --  115*  BUN 19  --   --  19 25*  --   --  34*  CREATININE 1.49*  --   --  1.39* 1.83*  --   --  1.96*  CALCIUM 9.0  --   --  8.3* 8.4*  --   --  7.8*  MG  --   --  1.5*  --   --  2.1 2.1 1.9  PHOS  --   --  3.7  --   --  4.0 4.2 4.9*   GFR: Estimated Creatinine Clearance: 34.9 mL/min (A) (by C-G formula based on SCr of 1.96 mg/dL (H)). Recent Labs  Lab 10/06/2018 2045 10/17/2018 2049 10/20/18 0031 10/20/18 0604 10/21/18 0352 10/22/18 0530  WBC  --  17.9*  --  16.6* 13.0* 11.5*  LATICACIDVEN 4.8*  --  1.8  --   --   --     Liver Function Tests: Recent Labs  Lab 10/22/2018 2049 10/20/18 0604 10/21/18 0352  AST 314* 257* 97*  ALT 197* 154* 88*  ALKPHOS 134* 110 76  BILITOT 0.7 0.8 0.6  PROT 7.0 5.9* 6.0*    ALBUMIN 2.7* 2.5* 3.0*   Recent Labs  Lab 10/20/18 0031  LIPASE 29   Recent Labs  Lab 10/05/2018 2045  AMMONIA 36*    ABG    Component Value Date/Time   PHART 7.324 (L) 10/22/2018 0348   PCO2ART 43.0 10/22/2018 0348   PO2ART 120 (H) 10/22/2018 0348   HCO3 21.7 10/22/2018 0348   TCO2 24 09/29/2018 2303   ACIDBASEDEF 3.3 (H) 10/22/2018 0348   O2SAT 98.2 10/22/2018 0348     Coagulation Profile: Recent Labs  Lab 10/16/2018 2049  INR 1.06    Cardiac Enzymes: Recent Labs  Lab 10/20/18 0031 10/20/18 0604 10/20/18 1135  TROPONINI 0.09* 0.09* 0.06*    HbA1C: Hemoglobin A1C  Date/Time Value Ref Range Status  03/10/2017 7.4  Final  11/29/2016 6.5  Final    CBG: Recent Labs  Lab 10/21/18 1618 10/21/18 2009 10/21/18 2203 10/21/18 2346 10/22/18 0412  GLUCAP 84 93 91 119* 114*   Attending Note:  68 year old female  with PMH above who presents after a cardiac arrest for 25-30 minutes that continue to seize on multiple medications for burst suppression.  No further events overnight.  On exam, she remains unresponsive and not following command.  I reviewed CXR myself, ETT is in a good position.  Discussed with PCCM-NP.  Will continue burst suppression.  Appreciate input from neuro.  Will continue support for now.  Once neurology is ready to prognosticate then will discuss plan of care.  PCCM will continue to manage.  The patient is critically ill with multiple organ systems failure and requires high complexity decision making for assessment and support, frequent evaluation and titration of therapies, application of advanced monitoring technologies and extensive interpretation of multiple databases.   Critical Care Time devoted to patient care services described in this note is  34  Minutes. This time reflects time of care of this signee Dr Jennet Maduro. This critical care time does not reflect procedure time, or teaching time or supervisory time of PA/NP/Med student/Med  Resident etc but could involve care discussion time.  Rush Farmer, M.D. [facility] Pulmonary/Critical Care Medicine. Pager: [phone]. After hours pager: [phone].

## 2018-10-22 NOTE — Procedures (Signed)
CPT/Type of Study: 48016; 24hr EEG with video Recording Date: 10/21/2018 07:30 - 10/22/2018 07:30 Interpreting physician: Izora Ribas, DO  History: This is a 68 year old patient, undergoing an EEG to evaluate for seizures.   Technical Description: The EEG was performed using standard setting per the guidelines of American Clinical Neurophysiology Society (ACNS).   A minimum of 21 electrodes were placed on scalp according to the International 10-20 or/and 10-10 Systems. Supplemental electrodes were placed as needed. Single EKG electrode was also used to detect cardiac arrhythmia. Patient's behavior was continuously recorded on video simultaneously with EEG. A minimum of 16 channels were used for data display. Each epoch of study was reviewed manually daily and as needed using standard referential and bipolar montages. Computerized quantitative EEG analysis (such as compressed spectral array analysis, trending, automated spike & seizure detection) were used as indicated.   Clinical State: Coma Background: Initially the background was suppressed with generalized periodic discharges occurring at 1-2 Hz, asynchronous in the left and right hemispheres. These evolved in frequency and amplitude, predominantly in the right temporal region, often with stimulation. In the second half of the recording the periodic discharges were not seen and the background was replaced by continuous slowing Overall Amplitude: Decreased Predominant Frequency: Delta and theta rhythms in the second half  Asymmetry: No Sleep background: Normal sleep architecture was not seen Rhythmic or periodic pattern: Yes as above Epileptiform activity: Yes as above Electrographic Seizure: Yes as above Events: No  Breach rhythm: No Reactivity: No  Stimulation procedures:  Hyperventilation: Not done Photic stimulation: Not done  Impression: This EEG shows evidence of a severe diffuse encephalopathy with cortical irritability.  Waxing/waning evolving epileptiform discharges predominantly in the right temporal region were seen in the first half of the recording with no apparent clinical signs. This was not seen in the later portion of the recording.

## 2018-10-22 NOTE — Progress Notes (Signed)
On call Dr. Aviva Kluver for Neurology was called to him that the patient's daughter is at the bedside. Discussion on patient's plan of care will occur on dayshift.

## 2018-10-22 NOTE — Progress Notes (Signed)
EEG LTM maint complete.

## 2018-10-22 NOTE — Progress Notes (Signed)
[place] Progress Note Patient Name: Angelica Beck DOB: 12-Nov-1950 MRN: [id number]   Date of Service  10/22/2018  HPI/Events of Note  Anemia - Hgb = 6.7.   eICU Interventions  Will transfuse 1 unit PRBC.     Intervention Category Major Interventions: Other:  Sommer,Steven Cornelia Copa 10/22/2018, 6:28 AM

## 2018-10-22 NOTE — Progress Notes (Signed)
Neurology Progress Note   S:// Seen and examined. No acute events overnight. LTM EEG from overnight showed evidence of severe diffuse encephalopathy with cortical irritability.  There was waxing waning evolving epileptiform discharges predominantly in the right temporal region which was seen during the first half of the recording with no apparent clinical signs.  This was not seen in the later portion of the recording.   O:// Current vital signs: BP (!) 108/52   Pulse 78   Temp (!) 96.6 F (35.9 C)   Resp 15   Ht '5\' 7"'  (1.702 m)   Wt 100.7 kg   SpO2 100%   BMI 34.77 kg/m  Vital signs in last 24 hours: Temp:  [96.6 F (35.9 C)-100.9 F (38.3 C)] 96.6 F (35.9 C) (01/28 1000) Pulse Rate:  [78-99] 78 (01/28 1000) Resp:  [12-23] 15 (01/28 1000) BP: (93-127)/(47-105) 108/52 (01/28 1000) SpO2:  [94 %-100 %] 100 % (01/28 1000) FiO2 (%):  [40 %] 40 % (01/28 0952) Weight:  [100.7 kg] 100.7 kg (01/28 0313) General: Sedated intubated HEENT: Normocephalic atraumatic CVS: H4-L9 heard regular rate rhythm Respiratory: Vented, clear to auscultation Extremities: Warm well perfused Neurological exam Mental status: Sedated on propofol and Versed.  Intubated.  Propofol held for a short duration of time for the exam. Language: Does not follow commands and is nonverbal. Cranial nerve exam: Pupils are pinpoint and slightly reactive to light if any.  Positive oculocephalic.  Difficult ascertain facial symmetry due to endotracheal tube.  Absent corneals. Breathing with the ventilator. Motor exam: No spontaneous movement noted.  On noxious stimulation, no movement of the upper extremities and no rhythmic shaking as had been seen yesterday.  On noxious stimulation of the lower extremity, right lower extremity showed possible triple flexion but nothing on the left. Tone is flaccid all over Sensation: As above Coordination and gait cannot be tested due to current mental  status  Medications  Current Facility-Administered Medications:  .  0.9 %  sodium chloride infusion (Manually program via Guardrails IV Fluids), , Intravenous, Once, Boone Master E, MD .  0.9 %  sodium chloride infusion, 250 mL, Intravenous, Continuous, Olalere, Adewale A, MD, Stopped at 10/22/18 0957 .  atorvastatin (LIPITOR) tablet 40 mg, 40 mg, Oral, q1800, Caffie Damme, MD, 40 mg at 10/21/18 1737 .  chlorhexidine gluconate (MEDLINE KIT) (PERIDEX) 0.12 % solution 15 mL, 15 mL, Mouth Rinse, BID, Maslonka, Sheral Apley, MD, 15 mL at 10/22/18 0749 .  feeding supplement (PRO-STAT SUGAR FREE 64) liquid 60 mL, 60 mL, Per Tube, TID, Rush Farmer, MD, 60 mL at 10/22/18 0905 .  feeding supplement (VITAL HIGH PROTEIN) liquid 1,000 mL, 1,000 mL, Per Tube, Q24H, Rush Farmer, MD, 1,000 mL at 10/21/18 1230 .  fentaNYL (SUBLIMAZE) injection 50 mcg, 50 mcg, Intravenous, Q15 min PRN, Deterding, Guadelupe Sabin, MD .  fentaNYL (SUBLIMAZE) injection 50 mcg, 50 mcg, Intravenous, Q2H PRN, Deterding, Guadelupe Sabin, MD .  insulin aspart (novoLOG) injection 0-9 Units, 0-9 Units, Subcutaneous, Q4H, Anders Simmonds, MD .  ipratropium-albuterol (DUONEB) 0.5-2.5 (3) MG/3ML nebulizer solution 3 mL, 3 mL, Nebulization, Q4H PRN, Caffie Damme, MD, 3 mL at 10/20/18 0756 .  lacosamide (VIMPAT) 100 mg in sodium chloride 0.9 % 25 mL IVPB, 100 mg, Intravenous, Q12H, Amie Portland, MD, Last Rate: 70 mL/hr at 10/22/18 1000 .  levETIRAcetam (KEPPRA) IVPB 1500 mg/ 100 mL premix, 1,500 mg, Intravenous, Q12H, Kerney Elbe, MD, Stopped at 10/22/18 0520 .  MEDLINE mouth rinse, 15  mL, Mouth Rinse, 10 times per day, Caffie Damme, MD, 15 mL at 10/22/18 0958 .  methimazole (TAPAZOLE) tablet 10 mg, 10 mg, Per Tube, Daily, Maslonka, Matthew A, MD, 10 mg at 10/22/18 9937 .  midazolam (VERSED) 59m in NS 527m(60m45ml) premix infusion, 20 mg/hr, Intravenous, Continuous, AroAmie PortlandD, Last Rate: 10 mL/hr at 10/22/18  1000, 10 mg/hr at 10/22/18 1000 .  midazolam (VERSED) injection 1 mg, 1 mg, Intravenous, Q2H PRN, Deterding, EliGuadelupe SabinD .  multivitamin liquid 15 mL, 15 mL, Per Tube, Daily, Maslonka, MatSheral ApleyD, 15 mL at 10/22/18 0905 .  pantoprazole (PROTONIX) injection 40 mg, 40 mg, Intravenous, QHS, Maslonka, MatSheral ApleyD, 40 mg at 10/21/18 2133 .  phenylephrine (NEOSYNEPHRINE) 10-0.9 MG/250ML-% infusion, 0-400 mcg/min, Intravenous, Titrated, Olalere, Adewale A, MD .  propofol (DIPRIVAN) 1000 MG/100ML infusion, 5-80 mcg/kg/min, Intravenous, Titrated, Deterding, EliGuadelupe SabinD, Last Rate: 14.25 mL/hr at 10/22/18 1000, 25 mcg/kg/min at 10/22/18 1000 .  valproate (DEPACON) 475 mg in dextrose 5 % 50 mL IVPB, 15 mg/kg/day, Intravenous, Q8H, LinKerney ElbeD, Stopped at 10/22/18 0605 Labs CBC    Component Value Date/Time   WBC 11.5 (H) 10/22/2018 0530   RBC 2.64 (L) 10/22/2018 0530   HGB 6.7 (LL) 10/22/2018 0530   HCT 22.0 (L) 10/22/2018 0530   HCT 26.8 (L) 06/27/2016 1536   PLT 149 (L) 10/22/2018 0530   MCV 83.3 10/22/2018 0530   MCH 25.4 (L) 10/22/2018 0530   MCHC 30.5 10/22/2018 0530   RDW 14.8 10/22/2018 0530   LYMPHSABS 0.5 (L) 10/20/2018 0604   MONOABS 0.8 10/20/2018 0604   EOSABS 0.0 10/20/2018 0604   BASOSABS 0.0 10/20/2018 0604    CMP     Component Value Date/Time   NA 133 (L) 10/22/2018 0530   NA 141 04/09/2017   K 4.1 10/22/2018 0530   CL 100 10/22/2018 0530   CO2 20 (L) 10/22/2018 0530   GLUCOSE 115 (H) 10/22/2018 0530   BUN 34 (H) 10/22/2018 0530   BUN 37 (A) 04/09/2017   CREATININE 1.96 (H) 10/22/2018 0530   CALCIUM 7.8 (L) 10/22/2018 0530   PROT 6.0 (L) 10/21/2018 0352   ALBUMIN 3.0 (L) 10/21/2018 0352   AST 97 (H) 10/21/2018 0352   ALT 88 (H) 10/21/2018 0352   ALKPHOS 76 10/21/2018 0352   BILITOT 0.6 10/21/2018 0352   GFRNONAA 26 (L) 10/22/2018 0530   GFRAA 30 (L) 10/22/2018 0530   Imaging I have reviewed images in epic and the results pertinent to this  consultation are: CT-scan of the brain -on arrival showed no acute changes  Assessment:  [age]ar old woman with vascular dementia, coronary artery disease, CHF admitted after PEA arrest and neurological consultation obtained for seizure-like activity. Stat EEG the day before showed myoclonic status epilepticus.  She was loaded with Keppra and Depakote and started on standing doses of Keppra and Depakote along with Versed drip and propofol drip. Versed and propofol drips were increased yesterday with LTM EEG showing decreased irritability and cessation of the clinical seizures. Her exam today shows some brainstem function and no other evidence of prior cortical function. Given her poor baseline and the current exam along with a history of PEA cardiac arrest, prognosis for a neurologically meaningful recovery appears to be poor. I will continue the EEG for another 24 hours and after removing the leads obtain an MRI to further get more information prior to having discussions with family unless the family is already ready to withdraw  care and pursue comfort measures given her poor baseline.  Impression: Anoxic/hypoxic encephalopathy Coma Status epilepticus-resolved  Recommendations: Continue LTM for now Propofol 25 mcg/kg/min Versed 20 mg/h Vimpat 100 twice daily Keppra 1500 twice daily Depakote 475 mg twice daily Check LFT and ammonia levels MRI after discontinuation of LTM tomorrow Do not see any family at bedside. We will communicate my plan to the ICU team and will be available to speak with the family as needed.  -- Amie Portland, MD [facility] Pager: [phone] If 7pm to 7am, please call on call as listed on AMION.  CRITICAL CARE ATTESTATION Performed by: Amie Portland, MD Total critical care time: 20 minutes Critical care time was exclusive of separately billable procedures and treating other patients and/or supervising APPs/Residents/Students Critical care was  necessary to treat or prevent imminent or life-threatening deterioration due to hypoxic anoxic encephalopathy, coma, status epilepticus. This patient is critically ill and at significant risk for neurological worsening and/or death and care requires constant monitoring. Critical care was time spent personally by me on the following activities: development of treatment plan with patient and/or surrogate as well as nursing, discussions with consultants, evaluation of patient's response to treatment, examination of patient, obtaining history from patient or surrogate, ordering and performing treatments and interventions, ordering and review of laboratory studies, ordering and review of radiographic studies, pulse oximetry, re-evaluation of patient's condition, participation in multidisciplinary rounds and medical decision making of high complexity in the care of this patient.

## 2018-10-22 NOTE — Plan of Care (Signed)
  Problem: Clinical Measurements: Goal: Will remain free from infection Outcome: Progressing Goal: Cardiovascular complication will be avoided Outcome: Progressing   Problem: Coping: Goal: Level of anxiety will decrease Outcome: Progressing   Problem: Elimination: Goal: Will not experience complications related to bowel motility Outcome: Progressing Goal: Will not experience complications related to urinary retention Outcome: Progressing   Problem: Cardiac: Goal: Ability to achieve and maintain adequate cardiopulmonary perfusion will improve Outcome: Progressing   Problem: Education: Goal: Knowledge of General Education information will improve Description Including pain rating scale, medication(s)/side effects and non-pharmacologic comfort measures Outcome: Not Progressing   Problem: Health Behavior/Discharge Planning: Goal: Ability to manage health-related needs will improve Outcome: Not Progressing   Problem: Clinical Measurements: Goal: Ability to maintain clinical measurements within normal limits will improve Outcome: Not Progressing Goal: Diagnostic test results will improve Outcome: Not Progressing Goal: Respiratory complications will improve Outcome: Not Progressing   Problem: Activity: Goal: Risk for activity intolerance will decrease Outcome: Not Progressing   Problem: Nutrition: Goal: Adequate nutrition will be maintained Outcome: Not Progressing   Problem: Pain Managment: Goal: General experience of comfort will improve Outcome: Not Progressing   Problem: Safety: Goal: Ability to remain free from injury will improve Outcome: Not Progressing   Problem: Skin Integrity: Goal: Risk for impaired skin integrity will decrease Outcome: Not Progressing   Problem: Skin Integrity: Goal: Risk for impaired skin integrity will be minimized. Outcome: Not Progressing   Problem: Activity: Goal: Ability to tolerate increased activity will improve Outcome:  Not Progressing   Problem: Respiratory: Goal: Ability to maintain a clear airway and adequate ventilation will improve Outcome: Not Progressing   Problem: Role Relationship: Goal: Method of communication will improve Outcome: Not Progressing

## 2018-10-22 NOTE — Progress Notes (Signed)
CRITICAL VALUE ALERT  Critical Value:  Hgb 6.7  Date & Time Notied:  10/22/2018 0630  Provider Notified: Dr. Emmit Alexanders  Orders Received/Actions taken: See new orders

## 2018-10-23 ENCOUNTER — Inpatient Hospital Stay (HOSPITAL_COMMUNITY): Payer: Medicare Other

## 2018-10-23 LAB — CULTURE, BLOOD (ROUTINE X 2): SPECIAL REQUESTS: ADEQUATE

## 2018-10-23 LAB — BLOOD GAS, ARTERIAL
Acid-base deficit: 3.4 mmol/L — ABNORMAL HIGH (ref 0.0–2.0)
Bicarbonate: 21.4 mmol/L (ref 20.0–28.0)
Drawn by: 44166
FIO2: 40
O2 Saturation: 98.6 %
PEEP: 5 cmH2O
Patient temperature: 98.6
RATE: 15 resp/min
VT: 450 mL
pCO2 arterial: 40.6 mmHg (ref 32.0–48.0)
pH, Arterial: 7.342 — ABNORMAL LOW (ref 7.350–7.450)
pO2, Arterial: 126 mmHg — ABNORMAL HIGH (ref 83.0–108.0)

## 2018-10-23 LAB — BASIC METABOLIC PANEL
Anion gap: 14 (ref 5–15)
BUN: 43 mg/dL — ABNORMAL HIGH (ref 8–23)
CALCIUM: 8.3 mg/dL — AB (ref 8.9–10.3)
CO2: 20 mmol/L — ABNORMAL LOW (ref 22–32)
Chloride: 105 mmol/L (ref 98–111)
Creatinine, Ser: 2.01 mg/dL — ABNORMAL HIGH (ref 0.44–1.00)
GFR calc Af Amer: 29 mL/min — ABNORMAL LOW (ref >60)
GFR calc non Af Amer: 25 mL/min — ABNORMAL LOW (ref >60)
Glucose, Bld: 91 mg/dL (ref 70–99)
Potassium: 3.7 mmol/L (ref 3.5–5.1)
Sodium: 139 mmol/L (ref 135–145)

## 2018-10-23 LAB — TYPE AND SCREEN
ABO/RH(D): O POS
ANTIBODY SCREEN: NEGATIVE
Unit division: 0

## 2018-10-23 LAB — GLUCOSE, CAPILLARY
GLUCOSE-CAPILLARY: 77 mg/dL (ref 70–99)
Glucose-Capillary: 95 mg/dL (ref 70–99)
Glucose-Capillary: 103 mg/dL — ABNORMAL HIGH (ref 70–99)
Glucose-Capillary: 128 mg/dL — ABNORMAL HIGH (ref 70–99)

## 2018-10-23 LAB — CBC
HCT: 27.6 % — ABNORMAL LOW (ref 36.0–46.0)
Hemoglobin: 8.6 g/dL — ABNORMAL LOW (ref 12.0–15.0)
MCH: 25.8 pg — AB (ref 26.0–34.0)
MCHC: 31.2 g/dL (ref 30.0–36.0)
MCV: 82.9 fL (ref 80.0–100.0)
Platelets: 187 10*3/uL (ref 150–400)
RBC: 3.33 MIL/uL — ABNORMAL LOW (ref 3.87–5.11)
RDW: 14.8 % (ref 11.5–15.5)
WBC: 11.3 10*3/uL — ABNORMAL HIGH (ref 4.0–10.5)
nRBC: 0 % (ref 0.0–0.2)

## 2018-10-23 LAB — BPAM RBC
Blood Product Expiration Date: 202002222359
ISSUE DATE / TIME: 202001280703
Unit Type and Rh: 5100

## 2018-10-23 LAB — TRIGLYCERIDES: Triglycerides: 93 mg/dL (ref <150)

## 2018-10-23 MED ORDER — FUROSEMIDE 10 MG/ML IJ SOLN
20.0000 mg | Freq: Four times a day (QID) | INTRAMUSCULAR | Status: AC
  Administered 2018-10-23 (×3): 20 mg via INTRAVENOUS
  Filled 2018-10-23 (×3): qty 2

## 2018-10-23 MED ORDER — MAGNESIUM SULFATE 2 GM/50ML IV SOLN
2.0000 g | Freq: Once | INTRAVENOUS | Status: AC
  Administered 2018-10-23: 2 g via INTRAVENOUS
  Filled 2018-10-23: qty 50

## 2018-10-23 MED ORDER — LACTULOSE 10 GM/15ML PO SOLN
20.0000 g | Freq: Two times a day (BID) | ORAL | Status: AC
  Administered 2018-10-23 (×2): 20 g via ORAL
  Filled 2018-10-23 (×2): qty 30

## 2018-10-23 MED ORDER — POTASSIUM CHLORIDE 20 MEQ/15ML (10%) PO SOLN
40.0000 meq | Freq: Three times a day (TID) | ORAL | Status: AC
  Administered 2018-10-23 (×2): 40 meq
  Filled 2018-10-23 (×2): qty 30

## 2018-10-23 NOTE — Progress Notes (Addendum)
NEUROLOGY PROGRESS NOTE  Subjective: Patient continues to be intubated on significant sedating drugs.  Patient is not following commands due to sedating drugs.  EEG LTM still running. Last 24h read pending.  Exam: Vitals:   10/23/18 0715 10/23/18 0750  BP: (!) 121/49   Pulse: 78 78  Resp:  15  Temp: 99.3 F (37.4 C)   SpO2: 97% 100%    Physical Exam   HEENT-  Normocephalic, no lesions, without obvious abnormality.  Normal external eye and conjunctiva.  Extremities- Warm, dry and intact Musculoskeletal-no joint tenderness, deformity or swelling Skin-warm and dry, no hyperpigmentation, vitiligo, or suspicious lesions Neuro:  Mental Status: Patient is intubated, not following commands, significant sedation.  LTM continues to be running.  Currently does not show any epileptiform activity however awaiting for formal report..  Cranial Nerves: II: patient does not respond confrontation bilaterally,  III,IV,VI: doll's response absent bilaterally. pupils right 1 mm, left 1 mm,and nonreactive bilaterally V,VII: corneal reflex absent bilaterally  VIII: patient does not respond to verbal stimuli IX,X: gag reflex absent, XI: trapezius strength unable to test bilaterally XII: tongue strength unable to test Motor: Extremities flaccid throughout.  Triple reflex left greater than right however right toe does go up with noxious stimuli.  Stimulus induced right toe rhythmic movement however this.  Without stimulus Sensory: Triple reflex bilateral lower extremities Deep Tendon Reflexes:  Absent throughout.  Medications:  Scheduled: . sodium chloride   Intravenous Once  . atorvastatin  40 mg Oral q1800  . chlorhexidine gluconate (MEDLINE KIT)  15 mL Mouth Rinse BID  . feeding supplement (PRO-STAT SUGAR FREE 64)  60 mL Per Tube TID  . feeding supplement (VITAL HIGH PROTEIN)  1,000 mL Per Tube Q24H  . furosemide  20 mg Intravenous Q6H  . insulin aspart  0-9 Units Subcutaneous Q4H  . mouth  rinse  15 mL Mouth Rinse 10 times per day  . methimazole  10 mg Per Tube Daily  . multivitamin  15 mL Per Tube Daily  . pantoprazole (PROTONIX) IV  40 mg Intravenous QHS  . potassium chloride  40 mEq Per Tube TID   Continuous: . sodium chloride Stopped (10/23/18 0844)  . lacosamide (VIMPAT) IV 70 mL/hr at 10/23/18 0858  . levETIRAcetam Stopped (10/23/18 0547)  . magnesium sulfate 1 - 4 g bolus IVPB    . midazolam 4 mg/hr (10/23/18 0858)  . phenylephrine (NEO-SYNEPHRINE) Adult infusion 10 mcg/min (10/23/18 0858)  . propofol (DIPRIVAN) infusion Stopped (10/23/18 7867)  . valproate sodium Stopped (10/23/18 6720)    Pertinent Labs/Diagnostics: Dg Chest Port 1 View  Result Date: 10/23/2018 CLINICAL DATA:  Respiratory failure EXAM: PORTABLE CHEST 1 VIEW COMPARISON:  10/22/2018 FINDINGS: Cardiac shadow is again enlarged but stable. Endotracheal tube and gastric catheter are seen in satisfactory position. Stable right pleural effusion is noted. Central vascular congestion is seen without focal confluent infiltrate. No bony abnormality is noted. IMPRESSION: Stable right pleural effusion. Vascular congestion consistent with CHF. Electronically Signed   By: Inez Catalina M.D.   On: 10/23/2018 08:13   Dg Chest Port 1 View  Result Date: 10/22/2018 CLINICAL DATA:  Endotracheal tube EXAM: PORTABLE CHEST 1 VIEW COMPARISON:  Yesterday FINDINGS: Endotracheal tube tip at the clavicular heads. The orogastric tube reaches the stomach. Haziness of the right more than left chest. Chronic cardiomegaly with coronary stenting. No pneumothorax. IMPRESSION: 1. Unchanged hardware positioning with endotracheal tube 5 cm above the carina. 2. Cardiomegaly with hazy bilateral chest, likely pulmonary edema and  pleural effusions. Electronically Signed   By: Monte Fantasia M.D.   On: 10/22/2018 08:56   Etta Quill PA-C [facility] [phone]  Assessment: 68 year old female with vascular dementia, coronary  artery disease, CHF admitted after PEA arrest and neurological consultation obtained for seizure activity.  Initial EEG showed myoclonic status epilepticus.  Patient was loaded with Keppra and Depakote along with Versed drip and propofol.  Versed and propofol were increased with LTM EEG showing decreased irritability and cessation of clinical seizures.  As noted previously- brainstem function is minimally present however she is on significant sedating medications which we will decrease today.  Given poor baseline and initial exam after PEA arrest prognosis for neurological meaningful recovery appears to be poor.  At this time will continue LTM.  Impression: Anoxic/hypoxic encephalopathy Coma Status epilepticus resolved.  Recommendations: 1. Stop Propofol gtt 2.  Titrate Versed as follows: Decrease to 76m now, then to 341mat 1100, 69m65mt 1300, 1mg67m 1500, and turn Versed gtt off at 1700.  Call with any changes and increased seizure activity once Versed is being weaned to off. 3.  Continue LTM at this point in time. 4.  Will need MRI once LTM is discontinued, likely tomorrow Given poor baseline, cardiac arrest, seizure and myoclonic status at presentation early in course after cardiac arrest, prognosis in terms of neurologically meaningful recovery does not seem to be good but further prognostication after exam/imaging off of sedation.  Communicated plan to PCCM attending and resident physician on the unit.  We will continue to follow.  10/23/2018, 9:00 AM  Attending Neurohospitalist Addendum Patient seen and examined with APP/Resident. Agree with the history and physical as documented above. Agree with the plan as documented, which I helped formulate and I have made edits in the note as needed. I have independently reviewed the chart, obtained history, review of systems and examined the patient.I have personally reviewed pertinent head/neck/spine imaging (CT/MRI). Please feel free to call  with any questions. --- AshiAmie Portland [facility] Pager: [phone] 7pm to 7am, please call on call as listed on AMION.  CRITICAL CARE ATTESTATION Performed by: AshiAmie Portland Total critical care time: 20 minutes Critical care time was exclusive of separately billable procedures and treating other patients and/or supervising APPs/Residents/Students Critical care was necessary to treat or prevent imminent or life-threatening deterioration due to cardiac arrest, coma, myoclonic status epilpeticus This patient is critically ill and at significant risk for neurological worsening and/or death and care requires constant monitoring. Critical care was time spent personally by me on the following activities: development of treatment plan with patient and/or surrogate as well as nursing, discussions with consultants, evaluation of patient's response to treatment, examination of patient, obtaining history from patient or surrogate, ordering and performing treatments and interventions, ordering and review of laboratory studies, ordering and review of radiographic studies, pulse oximetry, re-evaluation of patient's condition, participation in multidisciplinary rounds and medical decision making of high complexity in the care of this patient.

## 2018-10-23 NOTE — Procedures (Signed)
CPT/Type of Study: 08676; 24hr EEG with video Recording Date: 10/22/2018 07:30 - 10/23/2018 04:30 Interpreting physician: Izora Ribas, DO  History: This is a 68 year old patient, undergoing an EEG to evaluate for seizures.   Technical Description: The EEG was performed using standard setting per the guidelines of American Clinical Neurophysiology Society (ACNS).   A minimum of 21 electrodes were placed on scalp according to the International 10-20 or/and 10-10 Systems. Supplemental electrodes were placed as needed. Single EKG electrode was also used to detect cardiac arrhythmia. Patient's behavior was continuously recorded on video simultaneously with EEG. A minimum of 16 channels were used for data display. Each epoch of study was reviewed manually daily and as needed using standard referential and bipolar montages. Computerized quantitative EEG analysis (such as compressed spectral array analysis, trending, automated spike & seizure detection) were used as indicated.   Clinical State: Coma Background: Initially there was continuous delta slowing which transitioned into background suppression Overall Amplitude: Decreased --> suppressed (<10uV) Predominant Frequency: Delta and theta rhythms in the first half  Asymmetry: No Sleep background: Normal sleep architecture was not seen Rhythmic or periodic pattern: No Epileptiform activity: No Electrographic Seizure: No Events: No  Breach rhythm: No Reactivity: No  Stimulation procedures:  Hyperventilation: Not done Photic stimulation: Not done  Impression: This EEG shows evidence of a profound diffuse encephalopathy characterized by background suppression. No epileptiform discharges or EEG seizures were recorded.

## 2018-10-23 NOTE — Progress Notes (Addendum)
NAME:Angelica Beck, FGH:[id number], DOB:1951-07-16, LOS:1 ADMISSION DATE:09/28/2018, CONSULTATION DATE:October 19, 2018 REFERRING MD:Dr. Tegeler, CHIEF COMPLAINT:Cardiac arrest  Brief History   Angelica Beck is a 68 year old woman with vascular dementia, history of stroke, CKD stage III, hypertension, hypothyroidism, diastolic heart failure, history of GI bleed, chronic hypoxia on nocturnal oxygen and nonambulatory at baseline who presented following cardiac arrest. She achieve ROSCafter 7 to 12 minutes of CPR and epinephrine.On arrival, bedside EEG was consistent with status epilepticus.  Past Medical History  Vascular dementia History of stroke CKD stage III Hypertension Hypothyroidism Diastolic heart failure History of GI bleed Chronic hypoxia on nocturnal oxygen Nonambulatory  Significant Hospital Events   1/26admit>> 1/26EMS intubation>> 1/26EEG>>generalized status epilepticus  Consults:  Neurology  Procedures:  1/26admit>>  Significant Diagnostic Tests:  1/25 CT head>>unremarkable 1/26EEG>>generalized status epilepticus 1/26LTM EEG>>  Micro Data:  1/25 blood culture>>GP Rods, pending culture and sensitivity 1/25 tracheal aspirate>> 1/25 urine culture>> 1/28 Blood culture>>Corynbacterium   Antimicrobials:  1/25 vancomycin>>1/26 1/25 Zosyn>>    Interim history/subjective:  Overnight:  -Afebrile   Today, Angelica Beck continues to remain unresponsive.   Objective   Blood pressure (!) 139/54, pulse 76, temperature 99.5 F (37.5 C), resp. rate 15, height 5\' 7"  (1.702 m), weight 100.3 kg, SpO2 98 %.    Vent Mode: PRVC FiO2 (%):  [40 %] 40 % Set Rate:  [15 bmp] 15 bmp Vt Set:  [450 mL] 450 mL PEEP:  [5 cmH20] 5 cmH20 Plateau Pressure:  [17 cmH20-20 cmH20] 17 cmH20   Intake/Output Summary (Last 24 hours) at 10/23/2018 6789 Last data filed at 10/23/2018 0600 Gross per 24 hour  Intake 3347.69 ml  Output 3355 ml  Net -7.31  ml   Filed Weights   10/20/18 0300 10/22/18 0313 10/23/18 0500  Weight: 95 kg 100.7 kg 100.3 kg    Examination: General:Sedated, unresponsive, middle-aged appearing woman HENT:ET tube and OG tube in place, Pupils 2mm minimally reactive to light  Lungs: Coarse breath sounds Cardiovascular:Tachycardia, no MRG Abdomen:BS+, NTTP, soft Extremities:Warm to touch, no edema Neuro:Sedated  Resolved Hospital Problem list     Assessment & Plan:  Angelica Beck is a 67 year old woman with vascular dementia, history of stroke, CKD stage III, hypertension, hypothyroidism, diastolic heart failure, history of GI bleed, chronic hypoxia on nocturnal oxygen and nonambulatory at baseline who presented following cardiac arrests/p CPR, epinephrine with ROSC after 7-12 mins. Alsofound to be in status epilepticus on arrival.   #Myoclonic status epilepticus #Suspecting anoxic brain injury -s/p  LTM EEG which revealed severe diffuse encephalopathy with cortical irritability -Continue Keppra 1500 mg IVBID,Depakote 5 mg/Kg IV TID, Vimpat  -Continue propofol, Versed infusion -Appreciate neurology recommendation  - MRI today  -Neuro check  - Follow up LFTs, Ammonia   #Acute hypoxic/hypoxemic respiratory failure #Inability to protect airway -Secondary to cardiac arrest -Continue full mechanical ventilation -Pulmonary hygiene -Not ready for weaning or SBT -ABG as needed  -CXR looks unchanged from previously, ETT at appropriate position   #Hypotension- resolved -Pressors to keep MAP>65(Neo prn)  #Status post cardiac arrest -Etiology remains unclear. Given her history of diastolic heart failure, cardiac arrhythmia is possible. Also an ischemic event also remains a possibility -Telemetry telemetry monitoring -Trend troponin(0.06<0.09<0.09) -Echocardiogram showed preserved ejection fraction with EF 55 to 60%, pulmonary hypertension with peak pressure40mmHg  #Probable  aspiration pneumonia #Low suspicion for sepsis -Initial CXR with left basilar consolidation however repeat chest x-ray was unremarkable with good aeration and low suspicion for consolidation - Ceftriaxone x1 dose.Lactic acidosis and leukocytosis  were most likely secondary to seizure and cardiac arrest.  #Corynbacterium bacteremia - Pending cultures and sensitivity  - Discuss initiating abx therapy with Levaquin   #Hypothyroidism -Continue Synthroid  #Acute onChronic kidney disease - Most likely Pre-renal   - sCr 2<1.9 <1.8 (Baseline 1.6) -Continue to monitor BMP  #Normocytic normochromic anemia  -Hgb this am 8.6<6.7 -s/p 1U PRBC -Continue to monitor   Best practice:  Diet: N.p.o. Pain/Anxiety/Delirium protocol (if indicated): Fentanyl, Versed VAP protocol (if indicated): Yes DVT prophylaxis: SCDs GI prophylaxis: IV Protonix Glucose control: Sliding scale insulin Mobility: Bedrest Code Status: Full code Family Communication: None at bedside  Disposition: Remain in ICU   Labs   CBC: Recent Labs  Lab 10/14/2018 2049 09/25/2018 2303 10/20/18 0604 10/21/18 0352 10/22/18 0530 10/23/18 0343  WBC 17.9*  --  16.6* 13.0* 11.5* 11.3*  NEUTROABS 11.8*  --  15.3*  --   --   --   HGB 9.4* 9.2* 8.6* 7.0* 6.7* 8.6*  HCT 33.3* 27.0* 28.1* 21.9* 22.0* 27.6*  MCV 87.4  --  84.4 81.7 83.3 82.9  PLT 253  --  223 162 149* 188    Basic Metabolic Panel: Recent Labs  Lab 09/25/2018 2049 10/07/2018 2303 10/20/18 0031 10/20/18 0604 10/21/18 0352 10/21/18 1212 10/21/18 1618 10/22/18 0530 10/22/18 1704 10/23/18 0343  NA 136 137  --  136 135  --   --  133*  --  139  K 4.2 3.4*  --  3.5 3.9  --   --  4.1  --  3.7  CL 102  --   --  103 100  --   --  100  --  105  CO2 23  --   --  21* 22  --   --  20*  --  20*  GLUCOSE 235*  --   --  303* 82  --   --  115*  --  91  BUN 19  --   --  19 25*  --   --  34*  --  43*  CREATININE 1.49*  --   --  1.39* 1.83*  --   --  1.96*  --  2.01*    CALCIUM 9.0  --   --  8.3* 8.4*  --   --  7.8*  --  8.3*  MG  --   --  1.5*  --   --  2.1 2.1 1.9 1.9  --   PHOS  --   --  3.7  --   --  4.0 4.2 4.9* 5.2*  --    GFR: Estimated Creatinine Clearance: 34 mL/min (A) (by C-G formula based on SCr of 2.01 mg/dL (H)). Recent Labs  Lab 10/12/2018 2045  10/20/18 0031 10/20/18 0604 10/21/18 0352 10/22/18 0530 10/23/18 0343  WBC  --    < >  --  16.6* 13.0* 11.5* 11.3*  LATICACIDVEN 4.8*  --  1.8  --   --   --   --    < > = values in this interval not displayed.    Liver Function Tests: Recent Labs  Lab 10/05/2018 2049 10/20/18 0604 10/21/18 0352 10/22/18 1120  AST 314* 257* 97* 78*  ALT 197* 154* 88* 55*  ALKPHOS 134* 110 76 67  BILITOT 0.7 0.8 0.6 0.7  PROT 7.0 5.9* 6.0* 5.6*  ALBUMIN 2.7* 2.5* 3.0* 2.4*   Recent Labs  Lab 10/20/18 0031  LIPASE 29   Recent Labs  Lab 10/23/2018 2045 10/22/18  1120  AMMONIA 36* 51*    ABG    Component Value Date/Time   PHART 7.342 (L) 10/23/2018 0440   PCO2ART 40.6 10/23/2018 0440   PO2ART 126 (H) 10/23/2018 0440   HCO3 21.4 10/23/2018 0440   TCO2 24 10/17/2018 2303   ACIDBASEDEF 3.4 (H) 10/23/2018 0440   O2SAT 98.6 10/23/2018 0440     Coagulation Profile: Recent Labs  Lab 10/16/2018 2049  INR 1.06    Cardiac Enzymes: Recent Labs  Lab 10/20/18 0031 10/20/18 0604 10/20/18 1135  TROPONINI 0.09* 0.09* 0.06*    HbA1C: Hemoglobin A1C  Date/Time Value Ref Range Status  03/10/2017 7.4  Final  11/29/2016 6.5  Final    CBG: Recent Labs  Lab 10/22/18 1127 10/22/18 1603 10/22/18 2027 10/22/18 2341 10/23/18 0422  GLUCAP 96 70 80 80 95   Attending Note:  68 year old female s/p cardiac arrest who presents to PCCM with respiratory failure.  No further events overnight, no acute complaints.  On exam, remains completely unresponsive.  I reviewed CXR myself, ETT is in a good place with no acute disease noted.  Discussed with resident.  Will hold off weaning this AM.  Attempt to  decrease versed and propofol.  Keppra and vembat to continue.  Additional lasix today.  Once off sedation, will order MRI and likely discuss plan of care once resulted.  PCCM will continue to manage.  Appreciate input from neurology.  The patient is critically ill with multiple organ systems failure and requires high complexity decision making for assessment and support, frequent evaluation and titration of therapies, application of advanced monitoring technologies and extensive interpretation of multiple databases.   Critical Care Time devoted to patient care services described in this note is  32  Minutes. This time reflects time of care of this signee Dr Jennet Maduro. This critical care time does not reflect procedure time, or teaching time or supervisory time of PA/NP/Med student/Med Resident etc but could involve care discussion time.  Rush Farmer, M.D. [facility] Pulmonary/Critical Care Medicine. Pager: [phone]. After hours pager: [phone].

## 2018-10-23 NOTE — Progress Notes (Signed)
LTM EEG checked, no skin breakdown under Fp1 or Fp2

## 2018-10-23 NOTE — Progress Notes (Signed)
Patient was incontinent of loose brown stool and urine when turned in bed for repositioning.  Patient began to vomit a small amount of tube feeds.  Tube feeds were on hold.  Orally and nasally suctioned with Yankhauer.  Tube feeds turned off.  Checked amount of normal saline in urinary catheter balloon.  Added 27ml.  Cleansed and peri-care performed.  Will continue to monitor need for flexi seal.

## 2018-10-23 NOTE — Progress Notes (Signed)
Tube feeds restarted at 43ml/hr.

## 2018-10-23 NOTE — Progress Notes (Signed)
Versed drip turned off as ordered per Dr. Rory Percy order.  Spoke to him per telephone page and updated on patient's neuro status.  Patient has been able to cough weakly when ET suctioned.  Right eye has left upward gaze. Responds to pain only with external flexion to toes.  No reaction to painful stimulus to BUE.  Gag remains absent. Twitching present to left hand.  Ventilator is alarming some intermittently.  Notified respiratory.  Sedation to remain off.

## 2018-10-23 NOTE — Progress Notes (Addendum)
Dr. Rory Percy at the bedside and gave verbal orders to :  1. Stop Propofol gtt 2.  Titrate Versed as follows: Decrease to 4mg  now, then to 3mg  at 1100, 2mg  at 1300, 1mg  at 1500, and turn Versed gtt off at 1700.  Call with any changes and increased seizure activity once Versed is being weaned to off.

## 2018-10-24 ENCOUNTER — Inpatient Hospital Stay (HOSPITAL_COMMUNITY): Payer: Medicare Other

## 2018-10-24 DIAGNOSIS — Z515 Encounter for palliative care: Secondary | ICD-10-CM

## 2018-10-24 LAB — CBC
HCT: 27.3 % — ABNORMAL LOW (ref 36.0–46.0)
Hemoglobin: 8.6 g/dL — ABNORMAL LOW (ref 12.0–15.0)
MCH: 26.2 pg (ref 26.0–34.0)
MCHC: 31.5 g/dL (ref 30.0–36.0)
MCV: 83.2 fL (ref 80.0–100.0)
Platelets: 193 10*3/uL (ref 150–400)
RBC: 3.28 MIL/uL — ABNORMAL LOW (ref 3.87–5.11)
RDW: 14.9 % (ref 11.5–15.5)
WBC: 8.1 10*3/uL (ref 4.0–10.5)
nRBC: 0 % (ref 0.0–0.2)

## 2018-10-24 LAB — BASIC METABOLIC PANEL
Anion gap: 11 (ref 5–15)
BUN: 51 mg/dL — AB (ref 8–23)
CO2: 22 mmol/L (ref 22–32)
Calcium: 8.3 mg/dL — ABNORMAL LOW (ref 8.9–10.3)
Chloride: 110 mmol/L (ref 98–111)
Creatinine, Ser: 1.83 mg/dL — ABNORMAL HIGH (ref 0.44–1.00)
GFR calc Af Amer: 33 mL/min — ABNORMAL LOW (ref >60)
GFR calc non Af Amer: 28 mL/min — ABNORMAL LOW (ref >60)
Glucose, Bld: 114 mg/dL — ABNORMAL HIGH (ref 70–99)
POTASSIUM: 4.3 mmol/L (ref 3.5–5.1)
Sodium: 143 mmol/L (ref 135–145)

## 2018-10-24 LAB — PHOSPHORUS: Phosphorus: 3.7 mg/dL (ref 2.5–4.6)

## 2018-10-24 LAB — POCT I-STAT 7, (LYTES, BLD GAS, ICA,H+H)
Acid-base deficit: 1 mmol/L (ref 0.0–2.0)
Bicarbonate: 23.3 mmol/L (ref 20.0–28.0)
Calcium, Ion: 1.21 mmol/L (ref 1.15–1.40)
HCT: 24 % — ABNORMAL LOW (ref 36.0–46.0)
Hemoglobin: 8.2 g/dL — ABNORMAL LOW (ref 12.0–15.0)
O2 Saturation: 99 %
PCO2 ART: 37.9 mmHg (ref 32.0–48.0)
POTASSIUM: 4.3 mmol/L (ref 3.5–5.1)
Sodium: 145 mmol/L (ref 135–145)
TCO2: 24 mmol/L (ref 22–32)
pH, Arterial: 7.399 (ref 7.350–7.450)
pO2, Arterial: 126 mmHg — ABNORMAL HIGH (ref 83.0–108.0)

## 2018-10-24 LAB — GLUCOSE, CAPILLARY
GLUCOSE-CAPILLARY: 138 mg/dL — AB (ref 70–99)
GLUCOSE-CAPILLARY: 133 mg/dL — AB (ref 70–99)
GLUCOSE-CAPILLARY: 121 mg/dL — AB (ref 70–99)
Glucose-Capillary: 110 mg/dL — ABNORMAL HIGH (ref 70–99)
Glucose-Capillary: 111 mg/dL — ABNORMAL HIGH (ref 70–99)
Glucose-Capillary: 119 mg/dL — ABNORMAL HIGH (ref 70–99)
Glucose-Capillary: 129 mg/dL — ABNORMAL HIGH (ref 70–99)

## 2018-10-24 LAB — MAGNESIUM: Magnesium: 2.3 mg/dL (ref 1.7–2.4)

## 2018-10-24 LAB — AMMONIA: Ammonia: 26 umol/L (ref 9–35)

## 2018-10-24 MED ORDER — PANTOPRAZOLE SODIUM 40 MG PO PACK
40.0000 mg | PACK | Freq: Every day | ORAL | Status: DC
  Administered 2018-10-24: 40 mg
  Filled 2018-10-24: qty 20

## 2018-10-24 MED ORDER — HYDRALAZINE HCL 20 MG/ML IJ SOLN
10.0000 mg | INTRAMUSCULAR | Status: DC | PRN
  Administered 2018-10-24 – 2018-10-25 (×2): 10 mg via INTRAVENOUS
  Filled 2018-10-24 (×2): qty 1

## 2018-10-24 NOTE — Progress Notes (Signed)
LTM EEG D/C'd per Dr Rory Percy, no skin breakown noted

## 2018-10-24 NOTE — Progress Notes (Signed)
eLink Physician-Brief Progress Note Patient Name: Angelica Beck DOB: Jan 30, 1951 MRN: [id number]   Date of Service  10/24/2018  HPI/Events of Note  SBP 170s-190s  eICU Interventions  Hydralazine 10 mg IV prn SBP >180 ordered        [name] [name] 10/24/2018, 8:16 PM

## 2018-10-24 NOTE — Procedures (Addendum)
CPT/Type of Study: 04753; 24hr EEG with video Recording Date: 10/23/2018 08:06 - 10/24/2018 07:30  Interpreting physician: Izora Ribas, DO  History: This is a 68 year old patient, undergoing an EEG to evaluate for seizures.   Technical Description: The EEG was performed using standard setting per the guidelines of American Clinical Neurophysiology Society (ACNS).   A minimum of 21 electrodes were placed on scalp according to the International 10-20 or/and 10-10 Systems. Supplemental electrodes were placed as needed. Single EKG electrode was also used to detect cardiac arrhythmia. Patient's behavior was continuously recorded on video simultaneously with EEG. A minimum of 16 channels were used for data display. Each epoch of study was reviewed manually daily and as needed using standard referential and bipolar montages. Computerized quantitative EEG analysis (such as compressed spectral array analysis, trending, automated spike & seizure detection) were used as indicated.   Clinical State: Coma Background: background suppression Overall Amplitude: suppressed (<10uV) Asymmetry: No Sleep background: Normal sleep architecture was not seen Rhythmic or periodic pattern: No Epileptiform activity: No Electrographic Seizure: No Events: Yes the event button was pushed at 08:46 and 11:09 for unclear reasons and at 17:20 for hand twitching with no EEG change  Breach rhythm: No Reactivity: No  Stimulation procedures:  Hyperventilation: Not done Photic stimulation: Not done  Impression: This EEG shows evidence of a profound diffuse encephalopathy characterized by background suppression. No epileptiform discharges or EEG seizures were recorded.

## 2018-10-24 NOTE — Progress Notes (Addendum)
NAME:Katiana Delorse Lek, CBS:[id number], DOB:24-Jun-1951, LOS:1 ADMISSION DATE:10/25/2018, CONSULTATION DATE:October 19, 2018 REFERRING MD:Dr. Tegeler, CHIEF COMPLAINT:Cardiac arrest  Brief History   Ms. Kubisiak is a 68 year old woman with vascular dementia, history of stroke, CKD stage III, hypertension, hypothyroidism, diastolic heart failure, history of GI bleed, chronic hypoxia on nocturnal oxygen and nonambulatory at baseline who presented following cardiac arrest. She achieve ROSCafter 7 to 12 minutes of CPR and epinephrine.On arrival, bedside EEG was consistent with status epilepticus.  Past Medical History  Vascular dementia History of stroke CKD stage III Hypertension Hypothyroidism Diastolic heart failure History of GI bleed Chronic hypoxia on nocturnal oxygen Nonambulatory  Significant Hospital Events   1/26admit>> 1/26EMS intubation>> 1/26EEG>>generalized status epilepticus  Consults:  Neurology  Procedures:  1/26admit>>  Significant Diagnostic Tests:  1/25 CT head>>unremarkable 1/26EEG>>generalized status epilepticus 1/26LTM EEG>>  Micro Data:  1/25 blood culture>>GP Rods, pending culture and sensitivity 1/25 tracheal aspirate>> 1/25 urine culture>> 1/28 Blood culture>>Corynbacterium (most likely contaminant)   Antimicrobials:  1/25 vancomycin>>1/26 1/25 Zosyn>>1/27  Interim history/subjective:  Overnight:  --LTM EEG: profound diffuse encephalopathy characterized by background suppression. No epileptiform discharges or EEG seizures  --Propofol discontinued  --Versed decreased   Today, Ms. Fazzino is comfortably lying in bed.   Objective   Blood pressure (!) 114/59, pulse 95, temperature 99.9 F (37.7 C), resp. rate 16, height 5\' 7"  (1.702 m), weight 99.2 kg, SpO2 100 %.    Vent Mode: PRVC FiO2 (%):  [40 %] 40 % Set Rate:  [15 bmp] 15 bmp Vt Set:  [450 mL] 450 mL PEEP:  [5 cmH20] 5 cmH20 Plateau Pressure:   [18 cmH20-21 cmH20] 18 cmH20   Intake/Output Summary (Last 24 hours) at 10/24/2018 0600 Last data filed at 10/24/2018 0400 Gross per 24 hour  Intake 1438.54 ml  Output 2925 ml  Net -1486.46 ml   Filed Weights   10/22/18 0313 10/23/18 0500 10/24/18 0500  Weight: 100.7 kg 100.3 kg 99.2 kg    Examination: General: unresponsive, middle-aged appearing woman HENT:ET tube and OG tube in place, Pupils 62mmminimally reactive to light, cough reflex intact though decreased Lungs:Coarse breath sounds Cardiovascular: RRR, no MRG Abdomen:BS decreased, NTTP, soft Extremities:Warm to touch, no edema Neuro: Remains unresponsive however withdraws to pain at the lower extremities  Resolved Hospital Problem list     Assessment & Plan:  Ms. Brandner is a 68 year old woman with vascular dementia, history of stroke, CKD stage III, hypertension, hypothyroidism, diastolic heart failure, history of GI bleed, chronic hypoxia on nocturnal oxygen and nonambulatory at baseline who presented following cardiac arrests/p CPR, epinephrine with ROSC after 7-12 mins. Alsofound to be in status epilepticus on arrival.   #Myoclonic status epilepticus #Suspecting anoxic brain injury -LTM EEG which revealed severe diffuse encephalopathy with cortical irritability on admission  - overnight EEG revealed profound diffuse encephalopathy characterized by background suppression. No epileptiform discharges or EEG seizures -Continue Keppra 1500 mg IVBID,Depakote 5 mg/Kg IV TID, Vimpat - Propofol discontinued, Versed decreased  -Appreciate neurology recommendation  - MRI today  -Neuro check  #Acute hypoxic/hypoxemic respiratory failure #Inability to protect airway -Secondary to cardiac arrest -Continue full mechanical ventilation -Pulmonary hygiene -Not ready for weaning or SBT -ABG as needed -Follow up CXR  #Hypotension- resolved -Pressors to keep MAP>65(Neoprn)  #Status post cardiac  arrest -Etiology remains unclear. Given her history of diastolic heart failure, cardiac arrhythmia is possible. Also an ischemic event also remains a possibility -Telemetry telemetry monitoring -Trend troponin(0.06<0.09<0.09) -Echocardiogramshowedpreserved ejection fraction with EF 55 to 60%, pulmonary hypertension with  peak pressure41mmHg  #Probable aspiration pneumonia #Low suspicion for sepsis -Initial CXR with left basilar consolidation however repeat chest x-ray was unremarkable with good aeration and low suspicion for consolidation - Ceftriaxone x1 dose.Lactic acidosis and leukocytosis were most likely secondary to seizure and cardiac arrest.  #Hypothyroidism -Continue Synthroid  #Acute onChronic kidney disease - Most likely Pre-renal   - sCr 1.8<2<1.9 <1.8 (Baseline 1.6) -Continue to monitor BMP  #Normocytic normochromic anemia  -Hgb this am 8.2<8.6<6.7 -s/p 1U PRBC -Continue to monitor  Best practice:  Diet:N.p.o. Pain/Anxiety/Delirium protocol (if indicated):Fentanyl, Versed VAP protocol (if indicated):Yes DVT prophylaxis:SCDs GI prophylaxis:IV Protonix Glucose control:Sliding scale insulin Mobility:Bedrest Code Status:Full code Family Communication:None at bedside  Disposition:Remain in ICU    Labs   CBC: Recent Labs  Lab 09/29/2018 2049  10/20/18 0604 10/21/18 0352 10/22/18 0530 10/23/18 0343 10/24/18 0251 10/24/18 0457  WBC 17.9*  --  16.6* 13.0* 11.5* 11.3* 8.1  --   NEUTROABS 11.8*  --  15.3*  --   --   --   --   --   HGB 9.4*   < > 8.6* 7.0* 6.7* 8.6* 8.6* 8.2*  HCT 33.3*   < > 28.1* 21.9* 22.0* 27.6* 27.3* 24.0*  MCV 87.4  --  84.4 81.7 83.3 82.9 83.2  --   PLT 253  --  223 162 149* 187 193  --    < > = values in this interval not displayed.    Basic Metabolic Panel: Recent Labs  Lab 10/20/18 0604 10/21/18 0352 10/21/18 1212 10/21/18 1618 10/22/18 0530 10/22/18 1704 10/23/18 0343 10/24/18 0251 10/24/18 0457   NA 136 135  --   --  133*  --  139 143 145  K 3.5 3.9  --   --  4.1  --  3.7 4.3 4.3  CL 103 100  --   --  100  --  105 110  --   CO2 21* 22  --   --  20*  --  20* 22  --   GLUCOSE 303* 82  --   --  115*  --  91 114*  --   BUN 19 25*  --   --  34*  --  43* 51*  --   CREATININE 1.39* 1.83*  --   --  1.96*  --  2.01* 1.83*  --   CALCIUM 8.3* 8.4*  --   --  7.8*  --  8.3* 8.3*  --   MG  --   --  2.1 2.1 1.9 1.9  --  2.3  --   PHOS  --   --  4.0 4.2 4.9* 5.2*  --  3.7  --    GFR: Estimated Creatinine Clearance: 37.1 mL/min (A) (by C-G formula based on SCr of 1.83 mg/dL (H)). Recent Labs  Lab 09/29/2018 2045  10/20/18 0031  10/21/18 0352 10/22/18 0530 10/23/18 0343 10/24/18 0251  WBC  --    < >  --    < > 13.0* 11.5* 11.3* 8.1  LATICACIDVEN 4.8*  --  1.8  --   --   --   --   --    < > = values in this interval not displayed.    Liver Function Tests: Recent Labs  Lab 10/20/2018 2049 10/20/18 0604 10/21/18 0352 10/22/18 1120  AST 314* 257* 97* 78*  ALT 197* 154* 88* 55*  ALKPHOS 134* 110 76 67  BILITOT 0.7 0.8 0.6 0.7  PROT 7.0 5.9* 6.0*  5.6*  ALBUMIN 2.7* 2.5* 3.0* 2.4*   Recent Labs  Lab 10/20/18 0031  LIPASE 29   Recent Labs  Lab 09/29/2018 2045 10/22/18 1120 10/24/18 0251  AMMONIA 36* 51* 26    ABG    Component Value Date/Time   PHART 7.399 10/24/2018 0457   PCO2ART 37.9 10/24/2018 0457   PO2ART 126.0 (H) 10/24/2018 0457   HCO3 23.3 10/24/2018 0457   TCO2 24 10/24/2018 0457   ACIDBASEDEF 1.0 10/24/2018 0457   O2SAT 99.0 10/24/2018 0457     Coagulation Profile: Recent Labs  Lab 09/27/2018 2049  INR 1.06    Cardiac Enzymes: Recent Labs  Lab 10/20/18 0031 10/20/18 0604 10/20/18 1135  TROPONINI 0.09* 0.09* 0.06*    HbA1C: Hemoglobin A1C  Date/Time Value Ref Range Status  03/10/2017 7.4  Final  11/29/2016 6.5  Final    CBG: Recent Labs  Lab 10/23/18 1146 10/23/18 1646 10/23/18 2110 10/24/18 0001 10/24/18 0437  GLUCAP 103* 128* 138*  110* 119*   Attending Note:  68 year old female s/p cardiac arrest who presents with anoxic injury and respiratory failure.  No events overnight.  Of sedation, obtunded.  EEG flat.  On exam, lungs are clear.  I reviewed CXR myself, ETT is in a good position.  Spoke with patient's daughter.  Full DNR with no further escalation of care.  Will not add any additional medications at this point.  Will order MRI after EEG leads are off and plan on a family meeting in the morning and will recommend withdrawal of care to comfort.  The patient is critically ill with multiple organ systems failure and requires high complexity decision making for assessment and support, frequent evaluation and titration of therapies, application of advanced monitoring technologies and extensive interpretation of multiple databases.   Critical Care Time devoted to patient care services described in this note is  32  Minutes. This time reflects time of care of this signee Dr Jennet Maduro. This critical care time does not reflect procedure time, or teaching time or supervisory time of PA/NP/Med student/Med Resident etc but could involve care discussion time.  Rush Farmer, M.D. [facility] Pulmonary/Critical Care Medicine. Pager: [phone]. After hours pager: [phone].

## 2018-10-24 NOTE — Progress Notes (Addendum)
NEUROLOGY PROGRESS NOTE  Subjective: Patient remains intubated but with no sedation  Exam: Vitals:   10/24/18 0818 10/24/18 0900  BP: (!) 123/56 (!) 123/59  Pulse:  76  Resp: 15   Temp:    SpO2: 100% 100%    Physical Exam   HEENT-  Normocephalic, no lesions, without obvious abnormality.  Normal external eye and conjunctiva.  Extremities- Warm, dry and intact Musculoskeletal-no joint tenderness, deformity or swelling Skin-warm and dry, no hyperpigmentation, vitiligo, or suspicious lesions    Neuro:  Mental Status: Patient does not respond to verbal stimuli.  Does not respond to deep sternal rub.  Does not follow commands.  No verbalizations noted.  Not breathing over the vent Cranial Nerves: II: patient does not respond confrontation bilaterally,  III,IV,VI: doll's response absent bilaterally-today has disconjugate gaze. pupils right 1 mm, left 1 mm,and nonreactive bilaterally V,VII: corneal reflex present bilaterally today VIII: patient does not respond to verbal stimuli IX,X: gag reflex absent, XI: trapezius strength unable to test bilaterally XII: tongue strength unable to test Motor: Extremities flaccid throughout.  Triple reflex left greater than right however right toe does go up with noxious stimuli.  Stimulus induced right toe rhythmic movement however this.  Without stimulus Sensory:  Triple reflex with bilateral noxious stimuli Plantars: Mute    Medications:  Scheduled: . sodium chloride   Intravenous Once  . atorvastatin  40 mg Oral q1800  . chlorhexidine gluconate (MEDLINE KIT)  15 mL Mouth Rinse BID  . feeding supplement (PRO-STAT SUGAR FREE 64)  60 mL Per Tube TID  . feeding supplement (VITAL HIGH PROTEIN)  1,000 mL Per Tube Q24H  . insulin aspart  0-9 Units Subcutaneous Q4H  . mouth rinse  15 mL Mouth Rinse 10 times per day  . methimazole  10 mg Per Tube Daily  . multivitamin  15 mL Per Tube Daily  . pantoprazole sodium  40 mg Per Tube QHS    Continuous: . sodium chloride 20 mL/hr at 10/24/18 0900  . lacosamide (VIMPAT) IV 100 mg (10/24/18 1002)  . levETIRAcetam Stopped (10/24/18 1638)  . midazolam Stopped (10/23/18 1658)  . phenylephrine (NEO-SYNEPHRINE) Adult infusion Stopped (10/23/18 0924)  . propofol (DIPRIVAN) infusion Stopped (10/23/18 4665)  . valproate sodium Stopped (10/24/18 0609)   LDJ:TTSVXBLT (SUBLIMAZE) injection, fentaNYL (SUBLIMAZE) injection, ipratropium-albuterol, midazolam  Pertinent Labs/Diagnostics:   Dg Chest Port 1 View  Result Date: 10/24/2018 CLINICAL DATA:  Follow-up endotracheal tube EXAM: PORTABLE CHEST 1 VIEW COMPARISON:  10/23/2018 FINDINGS: Cardiac shadow is stable. Aortic calcifications are again seen. Right-sided pleural effusion is again identified as well as vascular congestion. The overall appearance is stable from the prior exam. Endotracheal tube and nasogastric catheter are noted and stable. IMPRESSION: Stable right-sided pleural effusion and vascular congestion. Tubes and lines as described. Electronically Signed   By: Inez Catalina M.D.   On: 10/24/2018 08:47   Dg Chest Port 1 View  Result Date: 10/23/2018 CLINICAL DATA:  Respiratory failure EXAM: PORTABLE CHEST 1 VIEW COMPARISON:  10/22/2018 FINDINGS: Cardiac shadow is again enlarged but stable. Endotracheal tube and gastric catheter are seen in satisfactory position. Stable right pleural effusion is noted. Central vascular congestion is seen without focal confluent infiltrate. No bony abnormality is noted. IMPRESSION: Stable right pleural effusion. Vascular congestion consistent with CHF. Electronically Signed   By: Inez Catalina M.D.   On: 10/23/2018 08:13     Etta Quill PA-C [facility] [phone]  Attending addendum Patient seen and examined.  Agree with examination  above. Long-term EEG shows profound diffuse encephalopathy characterized by gram suppression.  No epileptiform discharges or EEG seizures  noted.  Assessment: 68 year old female with vascular dementia, coronary artery disease, CHF admitted after PEA arrest and neurological consultation obtained for seizure activity.  Initial EEG showed myoclonic status epilepticus.  Patient was loaded with Keppra and Depakote along with Versed drip and propofol.  Versed and propofol were increased with LTM EEG showing decreased irritability and cessation of clinical seizures.  At this point all sedating drugs have been DC'd. Given poor baseline and initial exam after PEA arrest, early development of myoclonus, no significant improvement after cessation of sedation, prognosis for neurological meaningful recovery appears to be poor.   We will discontinue EEG at this time and obtain an MRI to get a picture of the brain and assess for anoxic injury.  Impression:  Anoxic/hypoxic encephalopathy Coma Status epilepticus resolved  Recommendations: -Continue to hold all sedating drugs - MRI brain when possible.  Have discussed with family member poor prognosis and they seem to be in acceptance of the possibility this may not be a recoverable incident  10/24/2018, 10:03 AM

## 2018-10-24 NOTE — Progress Notes (Signed)
LTM D/C'd per Dr Rory Percy

## 2018-10-25 ENCOUNTER — Inpatient Hospital Stay (HOSPITAL_COMMUNITY): Payer: Medicare Other

## 2018-10-25 DIAGNOSIS — Z7189 Other specified counseling: Secondary | ICD-10-CM

## 2018-10-25 LAB — BASIC METABOLIC PANEL
Anion gap: 7 (ref 5–15)
BUN: 63 mg/dL — AB (ref 8–23)
CO2: 23 mmol/L (ref 22–32)
Calcium: 8.8 mg/dL — ABNORMAL LOW (ref 8.9–10.3)
Chloride: 119 mmol/L — ABNORMAL HIGH (ref 98–111)
Creatinine, Ser: 1.58 mg/dL — ABNORMAL HIGH (ref 0.44–1.00)
GFR calc Af Amer: 39 mL/min — ABNORMAL LOW (ref >60)
GFR calc non Af Amer: 34 mL/min — ABNORMAL LOW (ref >60)
Glucose, Bld: 128 mg/dL — ABNORMAL HIGH (ref 70–99)
Potassium: 4 mmol/L (ref 3.5–5.1)
Sodium: 149 mmol/L — ABNORMAL HIGH (ref 135–145)

## 2018-10-25 LAB — CBC
HCT: 28.5 % — ABNORMAL LOW (ref 36.0–46.0)
Hemoglobin: 8.7 g/dL — ABNORMAL LOW (ref 12.0–15.0)
MCH: 25.7 pg — ABNORMAL LOW (ref 26.0–34.0)
MCHC: 30.5 g/dL (ref 30.0–36.0)
MCV: 84.1 fL (ref 80.0–100.0)
Platelets: 201 10*3/uL (ref 150–400)
RBC: 3.39 MIL/uL — ABNORMAL LOW (ref 3.87–5.11)
RDW: 15.3 % (ref 11.5–15.5)
WBC: 8.1 10*3/uL (ref 4.0–10.5)
nRBC: 0 % (ref 0.0–0.2)

## 2018-10-25 LAB — CULTURE, BLOOD (ROUTINE X 2)
Culture: NO GROWTH
Culture: NO GROWTH
Special Requests: ADEQUATE
Special Requests: ADEQUATE

## 2018-10-25 LAB — GLUCOSE, CAPILLARY
Glucose-Capillary: 114 mg/dL — ABNORMAL HIGH (ref 70–99)
Glucose-Capillary: 132 mg/dL — ABNORMAL HIGH (ref 70–99)
Glucose-Capillary: 133 mg/dL — ABNORMAL HIGH (ref 70–99)
Glucose-Capillary: 134 mg/dL — ABNORMAL HIGH (ref 70–99)

## 2018-10-25 LAB — MAGNESIUM: Magnesium: 2.3 mg/dL (ref 1.7–2.4)

## 2018-10-25 LAB — PHOSPHORUS: PHOSPHORUS: 3.2 mg/dL (ref 2.5–4.6)

## 2018-10-25 MED ORDER — GADOBUTROL 1 MMOL/ML IV SOLN
9.0000 mL | Freq: Once | INTRAVENOUS | Status: AC | PRN
  Administered 2018-10-25: 9 mL via INTRAVENOUS

## 2018-10-25 MED ORDER — FREE WATER
200.0000 mL | Freq: Three times a day (TID) | Status: DC
  Administered 2018-10-25 – 2018-10-26 (×4): 200 mL

## 2018-10-25 NOTE — Progress Notes (Signed)
Neurology Progress Note   S:// No acute changes overnight.  Patient had an MRI done that suggestive of global hypoxic/anoxic injury.   O:// Current vital signs: BP (!) 183/84   Pulse 76   Temp (!) 95.9 F (35.5 C) (Axillary)   Resp 15   Ht 5' 7" (1.702 m)   Wt 96.4 kg   SpO2 100%   BMI 33.29 kg/m  Vital signs in last 24 hours: Temp:  [95.9 F (35.5 C)-98 F (36.7 C)] 95.9 F (35.5 C) (01/31 0807) Pulse Rate:  [67-84] 76 (01/31 0804) Resp:  [15-18] 15 (01/31 0804) BP: (108-183)/(53-88) 183/84 (01/31 0804) SpO2:  [98 %-100 %] 100 % (01/31 0804) FiO2 (%):  [40 %] 40 % (01/31 0804) Weight:  [96.4 kg] 96.4 kg (01/31 0454) General: Not on any sedation at least for 48 hours, intubated HEENT: Cephalic atraumatic CVS: J0-R1 heard regular rate rhythm Respiratory: Vented Extremities: Trace edema bilateral legs Neurological exam Patient is intubated, not on any sedation for the last 48 hours. No spontaneous movements noted. She is breathing with the ventilator No cough or gag Her pupils are pinpoint but sluggishly reactive. No oculocephalic Her gaze is towards the right and slightly disconjugate and she has had roving gaze over the past couple of days. Corneal reflexes are present Motor exam: Extremities are flaccid throughout with no response to noxious simulation in the upper extremity and triple flexion response in the lower extremity with right lower extremity with a stronger response than the left. Sensory exam: Triple flexion bilaterally as documented above. Mute plantars bilaterally    Medications  Current Facility-Administered Medications:  .  0.9 %  sodium chloride infusion (Manually program via Guardrails IV Fluids), , Intravenous, Once, Anders Simmonds, MD .  0.9 %  sodium chloride infusion, 250 mL, Intravenous, Continuous, Olalere, Adewale A, MD, Last Rate: 20 mL/hr at 10/24/18 2000 .  atorvastatin (LIPITOR) tablet 40 mg, 40 mg, Oral, q1800, Caffie Damme, MD, 40 mg at 10/24/18 1844 .  chlorhexidine gluconate (MEDLINE KIT) (PERIDEX) 0.12 % solution 15 mL, 15 mL, Mouth Rinse, BID, Maslonka, Sheral Apley, MD, 15 mL at 10/25/18 0819 .  feeding supplement (PRO-STAT SUGAR FREE 64) liquid 60 mL, 60 mL, Per Tube, TID, Rush Farmer, MD, 60 mL at 10/24/18 2124 .  feeding supplement (VITAL HIGH PROTEIN) liquid 1,000 mL, 1,000 mL, Per Tube, Q24H, Rush Farmer, MD, 1,000 mL at 10/24/18 1228 .  fentaNYL (SUBLIMAZE) injection 50 mcg, 50 mcg, Intravenous, Q15 min PRN, Deterding, Guadelupe Sabin, MD .  fentaNYL (SUBLIMAZE) injection 50 mcg, 50 mcg, Intravenous, Q2H PRN, Deterding, Guadelupe Sabin, MD .  free water 200 mL, 200 mL, Per Tube, Q8H, Ledell Noss, MD, 200 mL at 10/25/18 5945 .  hydrALAZINE (APRESOLINE) injection 10 mg, 10 mg, Intravenous, Q4H PRN, Judd Lien, MD, 10 mg at 10/25/18 0808 .  insulin aspart (novoLOG) injection 0-9 Units, 0-9 Units, Subcutaneous, Q4H, Anders Simmonds, MD, 1 Units at 10/25/18 [phone] .  ipratropium-albuterol (DUONEB) 0.5-2.5 (3) MG/3ML nebulizer solution 3 mL, 3 mL, Nebulization, Q4H PRN, Caffie Damme, MD, 3 mL at 10/20/18 0756 .  lacosamide (VIMPAT) 100 mg in sodium chloride 0.9 % 25 mL IVPB, 100 mg, Intravenous, Q12H, Amie Portland, MD, Last Rate: 70 mL/hr at 10/24/18 2232, 100 mg at 10/24/18 2232 .  levETIRAcetam (KEPPRA) IVPB 1500 mg/ 100 mL premix, 1,500 mg, Intravenous, Q12H, Kerney Elbe, MD, Last Rate: 400 mL/hr at 10/25/18 0508, 1,500 mg at 10/25/18 0508 .  MEDLINE mouth rinse, 15 mL, Mouth Rinse, 10 times per day, Caffie Damme, MD, 15 mL at 10/25/18 0542 .  methimazole (TAPAZOLE) tablet 10 mg, 10 mg, Per Tube, Daily, Maslonka, Matthew A, MD, 10 mg at 10/24/18 1000 .  midazolam (VERSED) 86m in NS 510m(70m3ml) premix infusion, 20 mg/hr, Intravenous, Continuous, AroAmie PortlandD, Stopped at 10/23/18 1658 .  midazolam (VERSED) injection 1 mg, 1 mg, Intravenous, Q2H PRN, Deterding, EliGuadelupe SabinD .   multivitamin liquid 15 mL, 15 mL, Per Tube, Daily, Maslonka, MatSheral ApleyD, 15 mL at 10/24/18 1000 .  pantoprazole sodium (PROTONIX) 40 mg/20 mL oral suspension 40 mg, 40 mg, Per Tube, QHS, YacRush FarmerD, 40 mg at 10/24/18 2124 .  phenylephrine (NEOSYNEPHRINE) 10-0.9 MG/250ML-% infusion, 0-400 mcg/min, Intravenous, Titrated, Olalere, Adewale A, MD, Stopped at 10/23/18 0924 .  propofol (DIPRIVAN) 1000 MG/100ML infusion, 5-80 mcg/kg/min, Intravenous, Titrated, Deterding, EliGuadelupe SabinD, Stopped at 10/23/18 [phone] valproate (DEPACON) 475 mg in dextrose 5 % 50 mL IVPB, 15 mg/kg/day, Intravenous, Q8H, LinKerney ElbeD, Last Rate: 54.8 mL/hr at 10/25/18 0541, 475 mg at 10/25/18 0541 Labs CBC    Component Value Date/Time   WBC 8.1 10/25/2018 0349   RBC 3.39 (L) 10/25/2018 0349   HGB 8.7 (L) 10/25/2018 0349   HCT 28.5 (L) 10/25/2018 0349   HCT 26.8 (L) 06/27/2016 1536   PLT 201 10/25/2018 0349   MCV 84.1 10/25/2018 0349   MCH 25.7 (L) 10/25/2018 0349   MCHC 30.5 10/25/2018 0349   RDW 15.3 10/25/2018 0349   LYMPHSABS 0.5 (L) 10/20/2018 0604   MONOABS 0.8 10/20/2018 0604   EOSABS 0.0 10/20/2018 0604   BASOSABS 0.0 10/20/2018 0604    CMP     Component Value Date/Time   NA 149 (H) 10/25/2018 0349   NA 141 04/09/2017   K 4.0 10/25/2018 0349   CL 119 (H) 10/25/2018 0349   CO2 23 10/25/2018 0349   GLUCOSE 128 (H) 10/25/2018 0349   BUN 63 (H) 10/25/2018 0349   BUN 37 (A) 04/09/2017   CREATININE 1.58 (H) 10/25/2018 0349   CALCIUM 8.8 (L) 10/25/2018 0349   PROT 5.6 (L) 10/22/2018 1120   ALBUMIN 2.4 (L) 10/22/2018 1120   AST 78 (H) 10/22/2018 1120   ALT 55 (H) 10/22/2018 1120   ALKPHOS 67 10/22/2018 1120   BILITOT 0.7 10/22/2018 1120   GFRNONAA 34 (L) 10/25/2018 0349   GFRAA 39 (L) 10/25/2018 0349    glycosylated hemoglobin  Lipid Panel     Component Value Date/Time   CHOL  07/04/2009 0115    168        ATP III CLASSIFICATION:  <200     mg/dL   Desirable  200-239  mg/dL    Borderline High  >=240    mg/dL   High          TRIG 93 10/23/2018 0343   HDL 38 (L) 07/04/2009 0115   CHOLHDL 4.4 07/04/2009 0115   VLDL 14 07/04/2009 0115   LDLCALC (H) 07/04/2009 0115    116        Total Cholesterol/HDL:CHD Risk Coronary Heart Disease Risk Table                     Men   Women  1/2 Average Risk   3.4   3.3  Average Risk       5.0   4.4  2 X Average Risk   9.6  7.1  3 X Average Risk  23.4   11.0        Use the calculated Patient Ratio above and the CHD Risk Table to determine the patient's CHD Risk.        ATP III CLASSIFICATION (LDL):  <100     mg/dL   Optimal  100-129  mg/dL   Near or Above                    Optimal  130-159  mg/dL   Borderline  160-189  mg/dL   High  >190     mg/dL   Very High     Imaging I have reviewed images in epic and the results pertinent to this consultation are: MRI of the brain shows evidence of global hypoxic anoxic injury.  Assessment: 68 year old woman with vascular dementia, coronary artery disease, CHF admitted after a PEA arrest and possible seizure activity, noted to be myoclonic status epilepticus that resolved with Keppra, Depakote, Vimpat and required sedation with Versed and propofol which has been off for now about 48 hours, continues to have no meaningful response on exam. She does have intact brainstem reflexes but no evidence of meaningful responses to indicate high vertical function. Given the fact that she had a poor baseline, had a PEA arrest and early myoclonus along with an EEG that showed background suppression even after sedation was taken off, this portends an extremely grim prognosis for any neurological meaningful recovery. At this time, I recommend that the primary team speak with the family regarding goals of care and possible withdrawal of care if deemed appropriate  Impression: Anoxic brain injury  Recommendations: As said above, features present on clinical exam, electrographic studies and  imaging studies indicate towards a poor prognosis for neurologically meaningful recovery and at this point I think family meeting should be had about possible withdrawal of care.  Neurology services will be available as needed.  -- Amie Portland, MD [facility] Pager: [phone] If 7pm to 7am, please call on call as listed on AMION.  CRITICAL CARE ATTESTATION Performed by: Amie Portland, MD Total critical care time:  minutes Critical care time was exclusive of separately billable procedures and treating other patients and/or supervising APPs/Residents/Students Critical care was necessary to treat or prevent imminent or life-threatening deterioration due to anoxic brain injury. This patient is critically ill and at significant risk for neurological worsening and/or death and care requires constant monitoring. Critical care was time spent personally by me on the following activities: development of treatment plan with patient and/or surrogate as well as nursing, discussions with consultants, evaluation of patient's response to treatment, examination of patient, obtaining history from patient or surrogate, ordering and performing treatments and interventions, ordering and review of laboratory studies, ordering and review of radiographic studies, pulse oximetry, re-evaluation of patient's condition, participation in multidisciplinary rounds and medical decision making of high complexity in the care of this patient.

## 2018-10-25 NOTE — Progress Notes (Addendum)
NAME:  Angelica Beck, MRN:  [id number], DOB:  [birth year], LOS: 6 ADMISSION DATE:  10/02/2018, CONSULTATION DATE:January 81, 2020 REFERRING MD:Dr. Sherry Ruffing, CHIEF COMPLAINT:Cardiac arrest  Brief History   68 year old woman with extensive comorbidity who was transferred to the ICU after 7-12 minutes of PEA/ asystolic cardiac arrest. Post arrest EEG was concerning for status epilepticus and myoclonus was found on exam. Sedation on hold since 1/29 without return of neuro function.   Past Medical History  Vascular dementia, non ambulatory at baseline  Stroke  CKD III  Hypertension  Hyperthyroidism  CAD and Diastolic heart failure  GI bleed  Chronic hypoxia on nocturnal oxygen   Significant Hospital Events   1/26 admission, EEG consistent with status epilepticus   Consults:  Neurology   Procedures:    Significant Diagnostic Tests:  1/25 CT head>>unremarkable 1/26EEG>>generalized status epilepticus 1/26LTM EEG>>profound diffuse encephalopathy characterized by background suppression, no seizure activity  Echocardiogrampreserved ejection fraction with EF 55 to 60%, pulmonary hypertension with peak pressure31mmHg  Micro Data:  1/25 blood culture>>Corynebacterium in 1/3 bottles  MRSA neg   Antimicrobials:  1/25 vancomycin>>1/26 1/25 Zosyn>>1/27  Interim history/subjective:  Overnight no acute events    MRI yesterday confirmed diffuse hypoxic ischemic injury.    Objective   Blood pressure (!) 172/78, pulse 73, temperature (!) 97.3 F (36.3 C), temperature source Oral, resp. rate 15, height 5\' 7"  (1.702 m), weight 96.4 kg, SpO2 100 %.    Vent Mode: PRVC FiO2 (%):  [40 %] 40 % Set Rate:  [15 bmp] 15 bmp Vt Set:  [450 mL] 450 mL PEEP:  [5 cmH20] 5 cmH20 Plateau Pressure:  [8 OEV03-50 cmH20] 18 cmH20   Intake/Output Summary (Last 24 hours) at 10/25/2018 0938 Last data filed at 10/25/2018 0600 Gross per 24 hour  Intake 1420.47 ml  Output 2060 ml  Net -639.53  ml   Filed Weights   10/23/18 0500 10/24/18 0500 10/25/18 0454  Weight: 100.3 kg 99.2 kg 96.4 kg    Examination: General: chronically ill appearing, comfortable  HENT: ETT, OGT  Lungs: course breath sounds, reduced breath sounds over the right lower lung field  Cardiovascular: regular rate and rhythm  Abdomen: soft, non tender, non distended  Extremities: no peripheral edema  Neuro: does not respond to voice or painful stimulus, pupils are small and fixed, no spontaneous breaths on the vent  GU: foley in place   Resolved Hospital Problem list   Status Epilepticus   Assessment & Plan:   Anoxic encephalopathy after cardiac arrest leading to status epilepticus  MRI yesterday confirmed diffuse hypoxic ischemic injury. Off sedation since 1/29 without return of neurologic function.  - keppra 1500 mg IV BID, Depakote 5 mg/kg IV tid, vimpat  - appreciate neurology recommendations  - plan to discuss one way extubation today    Hypoxic respiratory failure secondary to anoxic brain injury Aspiration pneumonia  Chest xray this morning stable and consistent with prior. Remains afebrile and without leukocytosis  - full vent support, nearing the time for one way extubation   Post cardiac arrest  - tele monitoring  - statin   Hypernatremia  - started free water  Hypertension  - PRN hydralazine for SBP > 180   Hyperthyroidism  - home methimazole   Acute on chronic kidney disease  Creatinine improved to baseline today   Normocytic normochromic anemia  Hemoglobin stable at 8. Received 1 unit PRBC earlier this admission   Best practice:  Diet: Vital HF  Pain/Anxiety/Delirium protocol (  if indicated): yes VAP protocol (if indicated): yes  DVT prophylaxis: SCDs GI prophylaxis: protonix  Glucose control: CBG SSI  Mobility: bedrest Code Status: DNR  Family Communication: daughter updated at bedside yesterday, will do the same today  Disposition: likely one way extubation today    Labs   CBC: Recent Labs  Lab 10/21/2018 2049  10/20/18 0604 10/21/18 0352 10/22/18 0530 10/23/18 0343 10/24/18 0251 10/24/18 0457 10/25/18 0349  WBC 17.9*  --  16.6* 13.0* 11.5* 11.3* 8.1  --  8.1  NEUTROABS 11.8*  --  15.3*  --   --   --   --   --   --   HGB 9.4*   < > 8.6* 7.0* 6.7* 8.6* 8.6* 8.2* 8.7*  HCT 33.3*   < > 28.1* 21.9* 22.0* 27.6* 27.3* 24.0* 28.5*  MCV 87.4  --  84.4 81.7 83.3 82.9 83.2  --  84.1  PLT 253  --  223 162 149* 187 193  --  201   < > = values in this interval not displayed.    Basic Metabolic Panel: Recent Labs  Lab 10/21/18 0352  10/21/18 1618 10/22/18 0530 10/22/18 1704 10/23/18 0343 10/24/18 0251 10/24/18 0457 10/25/18 0349  NA 135  --   --  133*  --  139 143 145 149*  K 3.9  --   --  4.1  --  3.7 4.3 4.3 4.0  CL 100  --   --  100  --  105 110  --  119*  CO2 22  --   --  20*  --  20* 22  --  23  GLUCOSE 82  --   --  115*  --  91 114*  --  128*  BUN 25*  --   --  34*  --  43* 51*  --  63*  CREATININE 1.83*  --   --  1.96*  --  2.01* 1.83*  --  1.58*  CALCIUM 8.4*  --   --  7.8*  --  8.3* 8.3*  --  8.8*  MG  --    < > 2.1 1.9 1.9  --  2.3  --  2.3  PHOS  --    < > 4.2 4.9* 5.2*  --  3.7  --  3.2   < > = values in this interval not displayed.   GFR: Estimated Creatinine Clearance: 42.3 mL/min (A) (by C-G formula based on SCr of 1.58 mg/dL (H)). Recent Labs  Lab 09/25/2018 2045  10/20/18 0031  10/22/18 0530 10/23/18 0343 10/24/18 0251 10/25/18 0349  WBC  --    < >  --    < > 11.5* 11.3* 8.1 8.1  LATICACIDVEN 4.8*  --  1.8  --   --   --   --   --    < > = values in this interval not displayed.    Liver Function Tests: Recent Labs  Lab 10/15/2018 2049 10/20/18 0604 10/21/18 0352 10/22/18 1120  AST 314* 257* 97* 78*  ALT 197* 154* 88* 55*  ALKPHOS 134* 110 76 67  BILITOT 0.7 0.8 0.6 0.7  PROT 7.0 5.9* 6.0* 5.6*  ALBUMIN 2.7* 2.5* 3.0* 2.4*   Recent Labs  Lab 10/20/18 0031  LIPASE 29   Recent Labs  Lab 10/09/2018 2045  10/22/18 1120 10/24/18 0251  AMMONIA 36* 51* 26    ABG    Component Value Date/Time   PHART 7.399 10/24/2018 0457  PCO2ART 37.9 10/24/2018 0457   PO2ART 126.0 (H) 10/24/2018 0457   HCO3 23.3 10/24/2018 0457   TCO2 24 10/24/2018 0457   ACIDBASEDEF 1.0 10/24/2018 0457   O2SAT 99.0 10/24/2018 0457     Coagulation Profile: Recent Labs  Lab 10/18/2018 2049  INR 1.06    Cardiac Enzymes: Recent Labs  Lab 10/20/18 0031 10/20/18 0604 10/20/18 1135  TROPONINI 0.09* 0.09* 0.06*    HbA1C: Hemoglobin A1C  Date/Time Value Ref Range Status  03/10/2017 7.4  Final  11/29/2016 6.5  Final    CBG: Recent Labs  Lab 10/24/18 1220 10/24/18 1601 10/24/18 2024 10/25/18 0015 10/25/18 0413  GLUCAP 121* 129* 111* 134* 114*    Review of Systems:     Past Medical History  She,  has a past medical history of Arthritis, Asthma, Diabetes mellitus, type 2 ([place]), Diffuse goiter, Hyperlipidemia, Hypertension, Hyperthyroidism, Obesity hypoventilation syndrome ([place]) (06/20/2013), Stroke ([place]), and Vascular dementia ([place]).   Surgical History    Past Surgical History:  Procedure Laterality Date  . IR GENERIC HISTORICAL  07/12/2016   IR US GUIDE VASC ACCESS RIGHT 07/12/2016 [name], MD [facility]  . IR GENERIC HISTORICAL  07/12/2016   IR FLUORO GUIDE CV LINE RIGHT 07/12/2016 [name], MD [facility]  . IR GENERIC HISTORICAL  07/17/2016   IR GASTROSTOMY TUBE MOD SED 07/17/2016 [name], MD [facility]  . IR GENERIC HISTORICAL  08/23/2016   IR REMOVAL TUN CV CATH W/O FL 08/23/2016 [name], MD [facility]  . IR GENERIC HISTORICAL  11/09/2016   IR GASTROSTOMY TUBE REMOVAL 11/09/2016 [name], PA-C [facility]  . TEE WITHOUT CARDIOVERSION N/A 05/06/2018   Procedure: TRANSESOPHAGEAL ECHOCARDIOGRAM (TEE);  Surgeon: Dorothy Spark, MD;  Location: [facility] ENDOSCOPY;  Service: Cardiovascular;  Laterality: N/A;  . TRACHEOSTOMY TUBE PLACEMENT N/A 07/14/2016     Procedure: TRACHEOSTOMY, THYROID ISTHMUSECTOMY;  Surgeon: [name], MD;  Location: [facility];  Service: ENT;  Laterality: N/A;  . [place] History   reports that she quit smoking about 16 years ago. Her smoking use included cigarettes. She has a 7.50 pack-year smoking history. She has never used smokeless tobacco. She reports that she does not drink alcohol or use drugs.   Family History   Her family history includes Diabetes in her mother. There is no history of Cancer, Heart disease, or Stroke.   Allergies Allergies  Allergen Reactions  . Penicillins Swelling    Tolerated rocephin 2017 FACIAL SWELLING Has patient had a PCN reaction causing immediate rash, facial/tongue/throat swelling, SOB or lightheadedness with hypotension: Yes Has patient had a PCN reaction causing severe rash involving mucus membranes or skin necrosis: No Has patient had a PCN reaction that required hospitalization: No Has patient had a PCN reaction occurring within the last 10 years: No If all of the above answers are "NO", then may proceed with Cephalosporin use.   . Lactose Intolerance (Gi) Diarrhea and Nausea And Vomiting     Home Medications  Prior to Admission medications   Medication Sig Start Date End Date Taking? Authorizing Provider  acetaminophen (TYLENOL) 500 MG tablet Take 500 mg by mouth every 6 (six) hours as needed for mild pain.    Yes [provider]  albuterol (PROVENTIL) (2.5 MG/3ML) 0.083% nebulizer solution Take 2.5 mg by nebulization 3 (three) times daily as needed for wheezing or shortness of breath.   Yes [provider]  atorvastatin (LIPITOR) 40 MG  tablet Take 40 mg by mouth every evening.    Yes [provider]  cholecalciferol (VITAMIN D) 1000 units tablet Take 1,000 Units by mouth daily.   Yes [provider]  docusate sodium (COLACE) 100 MG capsule Take 100 mg by mouth daily.    Yes [provider]   famotidine (PEPCID) 20 MG tablet Take 20 mg by mouth daily.    Yes [provider]  furosemide (LASIX) 20 MG tablet Take 2 tablets (40 mg total) by mouth daily. Take 40 mg p.o. daily for 1 week, and then drop down to 20 mg daily. Patient taking differently: Take 20 mg by mouth daily.  05/09/18  Yes Ghimire, Henreitta Leber, MD  hydrALAZINE (APRESOLINE) 25 MG tablet Take 1 tablet (25 mg total) by mouth every 8 (eight) hours. 05/09/18  Yes Ghimire, Henreitta Leber, MD  insulin detemir (LEVEMIR) 100 UNIT/ML injection Inject 10 Units into the skin at bedtime.    Yes [provider]  Melatonin 3 MG TABS Take 3 mg by mouth at bedtime.    Yes [provider]  memantine (NAMENDA) 10 MG tablet Take 10 mg by mouth 2 (two) times daily.    Yes [provider]  methimazole (TAPAZOLE) 10 MG tablet Take 10 mg by mouth daily.    Yes [provider]  metoprolol tartrate (LOPRESSOR) 25 MG tablet Take 0.5 tablets (12.5 mg total) by mouth 2 (two) times daily. 05/09/18  Yes Ghimire, Henreitta Leber, MD  ondansetron (ZOFRAN) 4 MG tablet Take 4 mg by mouth every 8 (eight) hours as needed for nausea or vomiting.   Yes [provider]  potassium chloride SA (K-DUR,KLOR-CON) 20 MEQ tablet Take 20 mEq by mouth every Monday, Wednesday, and Friday.   Yes [provider]  VENTOLIN HFA 108 (90 Base) MCG/ACT inhaler Inhale 2 puffs into the lungs daily as needed for wheezing or shortness of breath.  10/09/18  Yes [provider]     Critical care time:     Attending Note:  68 year old female s/p cardiac arrest with severe anoxic brain injury on EEG and MRI.  No events overnight, remains unresponsive on exam with clear lungs.  I reviewed MRI myself, severe anoxic injury noted.  Patient has a respiratory drive however.  Discussed with neurology.  Awaiting family, will recommend withdrawal and transition to comfort care at this point.  Will not escalate care any further and when  family is ready will start morphine and extubate.  The patient is critically ill with multiple organ systems failure and requires high complexity decision making for assessment and support, frequent evaluation and titration of therapies, application of advanced monitoring technologies and extensive interpretation of multiple databases.   Critical Care Time devoted to patient care services described in this note is  33  Minutes. This time reflects time of care of this signee Dr Jennet Maduro. This critical care time does not reflect procedure time, or teaching time or supervisory time of PA/NP/Med student/Med Resident etc but could involve care discussion time.  Rush Farmer, M.D. [facility] Pulmonary/Critical Care Medicine. Pager: [phone]. After hours pager: [phone].

## 2018-10-25 NOTE — Progress Notes (Signed)
Nutrition Brief Note  Chart reviewed. Pt now transitioning to comfort care. No further nutrition interventions warranted at this time.  Please re-consult as needed.   Kerman Passey MS, RD, [place], [place] [phone] Pager  [phone] Weekend/On-Call Pager

## 2018-10-25 NOTE — Progress Notes (Signed)
RT Note:  Suctioned patient before and after MRI trip.  Transported patient to and from MRI without any difficulties, vital signs stable throughout.

## 2018-10-25 NOTE — Progress Notes (Signed)
Came back and spoke with family, informed daughter of the EEG and MRI results after speaking with neuro.  Recommended comfort care.  She would like to bring additional family in to say their goodbyes and will withdraw in AM.  Will d/c all blood works and all medications not related to comfort/seizure and extubate when daughter is ready.  She relayed Saturday or Sunday.   Rush Farmer, M.D. [facility] Pulmonary/Critical Care Medicine. Pager: [phone]. After hours pager: [phone].

## 2018-10-25 NOTE — Plan of Care (Signed)

## 2018-10-25 NOTE — Consult Note (Signed)
   [facility] CM Inpatient Consult   10/25/2018  Angelica Beck 1951-08-25 [id number]   Patient screened for extreme high risk score (32 %) and hospitalizations to check if potential [facility] services are needed . Patient was hospitalized for PEA Arrest noted. Brief chart review for needs and progress.  Currently on ventilator. Primary Care Provider is  Clovia Cuff, MD with [facility], not an affiliate of [facility] with patient with Medicare and not in network.   For questions contact:   Natividad Brood, RN BSN [facility] Liaison  [phone] business mobile phone Toll free office [phone]

## 2018-10-26 DIAGNOSIS — J9600 Acute respiratory failure, unspecified whether with hypoxia or hypercapnia: Secondary | ICD-10-CM

## 2018-10-26 MED ORDER — DIPHENHYDRAMINE HCL 50 MG/ML IJ SOLN: 25.0000 mg | INTRAMUSCULAR | Status: DC | PRN

## 2018-10-26 MED ORDER — POLYVINYL ALCOHOL 1.4 % OP SOLN
1.0000 [drp] | Freq: Four times a day (QID) | OPHTHALMIC | Status: DC | PRN
  Filled 2018-10-26: qty 15

## 2018-10-26 MED ORDER — GLYCOPYRROLATE 0.2 MG/ML IJ SOLN: 0.2000 mg | INTRAMUSCULAR | Status: DC | PRN

## 2018-10-26 MED ORDER — MORPHINE BOLUS VIA INFUSION
5.0000 mg | INTRAVENOUS | Status: DC | PRN
  Administered 2018-10-26: 5 mg via INTRAVENOUS
  Filled 2018-10-26: qty 5

## 2018-10-26 MED ORDER — ACETAMINOPHEN 650 MG RE SUPP: 650.0000 mg | Freq: Four times a day (QID) | RECTAL | Status: DC | PRN

## 2018-10-26 MED ORDER — GLYCOPYRROLATE 1 MG PO TABS
1.0000 mg | ORAL_TABLET | ORAL | Status: DC | PRN
  Filled 2018-10-26: qty 1

## 2018-10-26 MED ORDER — MORPHINE SULFATE (PF) 2 MG/ML IV SOLN: 2.0000 mg | INTRAVENOUS | Status: DC | PRN

## 2018-10-26 MED ORDER — ACETAMINOPHEN 325 MG PO TABS: 650.0000 mg | ORAL_TABLET | Freq: Four times a day (QID) | ORAL | Status: DC | PRN

## 2018-10-26 MED ORDER — MORPHINE 100MG IN NS 100ML (1MG/ML) PREMIX INFUSION
0.0000 mg/h | INTRAVENOUS | Status: DC
  Administered 2018-10-26: 5 mg/h via INTRAVENOUS
  Filled 2018-10-26: qty 100

## 2018-10-26 DEATH — deceased

## 2018-10-30 ENCOUNTER — Telehealth: Payer: Self-pay | Admitting: *Deleted

## 2018-10-30 NOTE — Telephone Encounter (Signed)
Received D/C from [facility]-D/C forwarded to Dr.Byrum to be signed.

## 2018-11-01 NOTE — Telephone Encounter (Signed)
Received signed D/C-[facility] Notified for pick as requested.

## 2018-11-24 NOTE — Progress Notes (Addendum)
Spoke with Angelica Beck at [facility] at 1600. Notified him the time of death.According to him,  Patient is not a suitable candidate for a donor.  Reference number: [id number]

## 2018-11-24 NOTE — Progress Notes (Signed)
Spoke with Marybelle Killings at [facility] regarding plans to extubate pt once morphine is infusing.

## 2018-11-24 NOTE — Progress Notes (Signed)
RT note- withdrawal of care orders placed and patient was extubated.

## 2018-11-24 NOTE — Progress Notes (Signed)
90cc morphine was wasted on witness of second [employer] farm manager.

## 2018-11-24 NOTE — Progress Notes (Signed)
NAME:  Angelica Beck, MRN:  [id number], DOB:  03-Feb-1951, LOS: 7 ADMISSION DATE:  10/15/2018, CONSULTATION DATE:January 51, 2020 REFERRING MD:Dr. Sherry Ruffing, CHIEF COMPLAINT:Cardiac arrest  Brief History   68 year old woman with extensive comorbidity who was transferred to the ICU after 7-12 minutes of PEA/ asystolic cardiac arrest. Post arrest EEG was concerning for status epilepticus and myoclonus was found on exam. Sedation on hold since 1/29 without return of neuro function.   Past Medical History  Vascular dementia, non ambulatory at baseline  Stroke  CKD III  Hypertension  Hyperthyroidism  CAD and Diastolic heart failure  GI bleed  Chronic hypoxia on nocturnal oxygen   Significant Hospital Events   1/26 admission, EEG consistent with status epilepticus   Consults:  Neurology   Procedures:    Significant Diagnostic Tests:  1/25 CT head>>unremarkable 1/26EEG>>generalized status epilepticus 1/26LTM EEG>>profound diffuse encephalopathy characterized by background suppression, no seizure activity  Echocardiogrampreserved ejection fraction with EF 55 to 60%, pulmonary hypertension with peak pressure47mmHg  Micro Data:  1/25 blood culture>>Corynebacterium in 1/3 bottles  MRSA neg   Antimicrobials:  1/25 vancomycin>>1/26 1/25 Zosyn>>1/27  Interim history/subjective:  No events over night .  Appears comfortable on vent Opens eyes.  Does not follow commands. Family discussion 1/31 with daughter and Dr Nelda Marseille . Pt for comfort care and withdrawal this weekend once family has had time to say their goodbyes.  MRI 1/31  confirmed diffuse hypoxic ischemic injury.    Objective   Blood pressure 106/64, pulse 98, temperature 98.3 F (36.8 C), temperature source Oral, resp. rate 15, height 5\' 7"  (1.702 m), weight 91.7 kg, SpO2 100 %.    Vent Mode: PRVC FiO2 (%):  [40 %] 40 % Set Rate:  [15 bmp] 15 bmp Vt Set:  [450 mL] 450 mL PEEP:  [5 cmH20] 5 cmH20 Plateau  Pressure:  [16 cmH20-19 cmH20] 19 cmH20   Intake/Output Summary (Last 24 hours) at 11/17/2018 0827 Last data filed at November 17, 2018 4081 Gross per 24 hour  Intake 569.69 ml  Output 1330 ml  Net -760.31 ml   Filed Weights   10/24/18 0500 10/25/18 0454 11-17-18 0500  Weight: 99.2 kg 96.4 kg 91.7 kg    Examination: General: Chronically ill-appearing on vent  HENT: ETT in place ,  Neck: Goiter   Lungs: Coarse breath sounds with no wheezing, on vent   Cardiovascular: Regular rate and rhythm no murmur rub or gallop Abdomen: Soft, obese, positive bowel sounds   Extremities: No edema   Neuro: Opens eyes.  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 Off sedation since 1/29 without return of neurologic function.  - keppra 1500 mg IV BID, Depakote 5 mg/kg IV tid, vimpat  - appreciate neurology recommendations  - plan comfort care/one way extubation when family is ready   Hypoxic respiratory failure secondary to anoxic brain injury Aspiration pneumonia  - full vent support for now  - comfort care and withdrawal when family ready .  -BD   Post cardiac arrest  - tele monitoring  -  Hypernatremia  - free water  Hypertension  - PRN hydralazine for SBP > 180   Hyperthyroidism  - home methimazole   Acute on chronic kidney disease  No labs at this time   Normocytic normochromic anemia  No labs at this time   Best practice:  Diet: Vital HF  Pain/Anxiety/Delirium protocol (if indicated): yes VAP protocol (if indicated): yes  DVT prophylaxis: SCDs GI prophylaxis: n/a  Glucose control: hold in iv sticks  Mobility: bedrest Code Status: DNR  Family Communication: daughter updated at bedside 1/31/ , discussion with comfort care decision when family ready this weekend    Disposition:comfort care   Labs   CBC: Recent Labs  Lab 10/01/2018 2049  10/20/18 0604 10/21/18 0352 10/22/18 0530 10/23/18 0343 10/24/18 0251 10/24/18 0457 10/25/18 0349  WBC 17.9*  --  16.6* 13.0* 11.5* 11.3* 8.1  --  8.1  NEUTROABS 11.8*  --  15.3*  --   --   --   --   --   --   HGB 9.4*   < > 8.6* 7.0* 6.7* 8.6* 8.6* 8.2* 8.7*  HCT 33.3*   < > 28.1* 21.9* 22.0* 27.6* 27.3* 24.0* 28.5*  MCV 87.4  --  84.4 81.7 83.3 82.9 83.2  --  84.1  PLT 253  --  223 162 149* 187 193  --  201   < > = values in this interval not displayed.    Basic Metabolic Panel: Recent Labs  Lab 10/21/18 0352  10/21/18 1618 10/22/18 0530 10/22/18 1704 10/23/18 0343 10/24/18 0251 10/24/18 0457 10/25/18 0349  NA 135  --   --  133*  --  139 143 145 149*  K 3.9  --   --  4.1  --  3.7 4.3 4.3 4.0  CL 100  --   --  100  --  105 110  --  119*  CO2 22  --   --  20*  --  20* 22  --  23  GLUCOSE 82  --   --  115*  --  91 114*  --  128*  BUN 25*  --   --  34*  --  43* 51*  --  63*  CREATININE 1.83*  --   --  1.96*  --  2.01* 1.83*  --  1.58*  CALCIUM 8.4*  --   --  7.8*  --  8.3* 8.3*  --  8.8*  MG  --    < > 2.1 1.9 1.9  --  2.3  --  2.3  PHOS  --    < > 4.2 4.9* 5.2*  --  3.7  --  3.2   < > = values in this interval not displayed.   GFR: Estimated Creatinine Clearance: 41.2 mL/min (A) (by C-G formula based on SCr of 1.58 mg/dL (H)). Recent Labs  Lab 10/18/2018 2045  10/20/18 0031  10/22/18 0530 10/23/18 0343 10/24/18 0251 10/25/18 0349  WBC  --    < >  --    < > 11.5* 11.3* 8.1 8.1  LATICACIDVEN 4.8*  --  1.8  --   --   --   --   --    < > = values in this interval not displayed.    Liver Function Tests: Recent Labs  Lab 09/30/2018 2049 10/20/18 0604 10/21/18 0352 10/22/18 1120  AST 314* 257* 97* 78*  ALT 197* 154* 88* 55*  ALKPHOS 134* 110 76 67  BILITOT 0.7 0.8 0.6 0.7  PROT 7.0 5.9* 6.0* 5.6*  ALBUMIN 2.7* 2.5* 3.0* 2.4*   Recent Labs  Lab 10/20/18 0031  LIPASE 29   Recent  Labs  Lab 10/01/2018 2045 10/22/18 1120 10/24/18 0251  AMMONIA  36* 51* 26    ABG    Component Value Date/Time   PHART 7.399 10/24/2018 0457   PCO2ART 37.9 10/24/2018 0457   PO2ART 126.0 (H) 10/24/2018 0457   HCO3 23.3 10/24/2018 0457   TCO2 24 10/24/2018 0457   ACIDBASEDEF 1.0 10/24/2018 0457   O2SAT 99.0 10/24/2018 0457     Coagulation Profile: Recent Labs  Lab 10/04/2018 2049  INR 1.06    Cardiac Enzymes: Recent Labs  Lab 10/20/18 0031 10/20/18 0604 10/20/18 1135  TROPONINI 0.09* 0.09* 0.06*    HbA1C: Hemoglobin A1C  Date/Time Value Ref Range Status  03/10/2017 7.4  Final  11/29/2016 6.5  Final    CBG: Recent Labs  Lab 10/24/18 2024 10/25/18 0015 10/25/18 0413 10/25/18 0809 10/25/18 1152  GLUCAP 111* 134* 114* 133* 132*    Review of Systems:     Past Medical History  She,  has a past medical history of Arthritis, Asthma, Diabetes mellitus, type 2 ([place]), Diffuse goiter, Hyperlipidemia, Hypertension, Hyperthyroidism, Obesity hypoventilation syndrome ([place]) (06/20/2013), Stroke ([place]), and Vascular dementia ([place]).   Surgical History    Past Surgical History:  Procedure Laterality Date  . IR GENERIC HISTORICAL  07/12/2016   IR US GUIDE VASC ACCESS RIGHT 07/12/2016 [name], MD [facility]  . IR GENERIC HISTORICAL  07/12/2016   IR FLUORO GUIDE CV LINE RIGHT 07/12/2016 [name], MD [facility]  . IR GENERIC HISTORICAL  07/17/2016   IR GASTROSTOMY TUBE MOD SED 07/17/2016 [name], MD [facility]  . IR GENERIC HISTORICAL  08/23/2016   IR REMOVAL TUN CV CATH W/O FL 08/23/2016 [name], MD [facility]  . IR GENERIC HISTORICAL  11/09/2016   IR GASTROSTOMY TUBE REMOVAL 11/09/2016 [name], PA-C [facility]  . TEE WITHOUT CARDIOVERSION N/A 05/06/2018   Procedure: TRANSESOPHAGEAL ECHOCARDIOGRAM (TEE);  Surgeon: Dorothy Spark, MD;  Location: [facility] ENDOSCOPY;  Service: Cardiovascular;  Laterality: N/A;  . TRACHEOSTOMY TUBE  PLACEMENT N/A 07/14/2016   Procedure: TRACHEOSTOMY, THYROID ISTHMUSECTOMY;  Surgeon: [name], MD;  Location: [place];  Service: ENT;  Laterality: N/A;  . Yoe History   reports that she quit smoking about 16 years ago. Her smoking use included cigarettes. She has a 7.50 pack-year smoking history. She has never used smokeless tobacco. She reports that she does not drink alcohol or use drugs.   Family History   Her family history includes Diabetes in her mother. There is no history of Cancer, Heart disease, or Stroke.   Allergies Allergies  Allergen Reactions  . Penicillins Swelling    Tolerated rocephin 2017 FACIAL SWELLING Has patient had a PCN reaction causing immediate rash, facial/tongue/throat swelling, SOB or lightheadedness with hypotension: Yes Has patient had a PCN reaction causing severe rash involving mucus membranes or skin necrosis: No Has patient had a PCN reaction that required hospitalization: No Has patient had a PCN reaction occurring within the last 10 years: No If all of the above answers are "NO", then may proceed with Cephalosporin use.   . Lactose Intolerance (Gi) Diarrhea and Nausea And Vomiting     Home Medications  Prior to Admission medications   Medication Sig Start Date End Date Taking? Authorizing Provider  acetaminophen (TYLENOL) 500 MG tablet Take 500 mg by mouth every 6 (six) hours as needed for mild pain.    Yes [provider]  albuterol (PROVENTIL) (2.5 MG/3ML) 0.083% nebulizer solution Take 2.5 mg by nebulization 3 (three) times  daily as needed for wheezing or shortness of breath.   Yes [provider]  atorvastatin (LIPITOR) 40 MG tablet Take 40 mg by mouth every evening.    Yes [provider]  cholecalciferol (VITAMIN D) 1000 units tablet Take 1,000 Units by mouth daily.   Yes [provider]  docusate sodium (COLACE) 100 MG capsule Take 100 mg by mouth daily.    Yes [provider]  famotidine (PEPCID) 20 MG tablet Take 20 mg by mouth daily.    Yes [provider]  furosemide (LASIX) 20 MG tablet Take 2 tablets (40 mg total) by mouth daily. Take 40 mg p.o. daily for 1 week, and then drop down to 20 mg daily. Patient taking differently: Take 20 mg by mouth daily.  05/09/18  Yes Ghimire, Henreitta Leber, MD  hydrALAZINE (APRESOLINE) 25 MG tablet Take 1 tablet (25 mg total) by mouth every 8 (eight) hours. 05/09/18  Yes Ghimire, Henreitta Leber, MD  insulin detemir (LEVEMIR) 100 UNIT/ML injection Inject 10 Units into the skin at bedtime.    Yes [provider]  Melatonin 3 MG TABS Take 3 mg by mouth at bedtime.    Yes [provider]  memantine (NAMENDA) 10 MG tablet Take 10 mg by mouth 2 (two) times daily.    Yes [provider]  methimazole (TAPAZOLE) 10 MG tablet Take 10 mg by mouth daily.    Yes [provider]  metoprolol tartrate (LOPRESSOR) 25 MG tablet Take 0.5 tablets (12.5 mg total) by mouth 2 (two) times daily. 05/09/18  Yes Ghimire, Henreitta Leber, MD  ondansetron (ZOFRAN) 4 MG tablet Take 4 mg by mouth every 8 (eight) hours as needed for nausea or vomiting.   Yes [provider]  potassium chloride SA (K-DUR,KLOR-CON) 20 MEQ tablet Take 20 mEq by mouth every Monday, Wednesday, and Friday.   Yes [provider]  VENTOLIN HFA 108 (90 Base) MCG/ACT inhaler Inhale 2 puffs into the lungs daily as needed for wheezing or shortness of breath.  10/09/18  Yes [provider]     Critical care time:      Tammy Parrett NP-C  [facility]  [phone]  11/14/2018

## 2018-11-24 NOTE — Progress Notes (Signed)
Asystole noted on bedside EKG.  Idelia Salm RN and I, Jenifer Jericho Cieslik RN pronounced death after auscultated heart x1 minute. Family at bedside.

## 2018-11-24 NOTE — Progress Notes (Signed)
Brief note,   Family at bedside, discussed wishes to proceed with comfort care and withdrawal of care .  Support provided   Tammy Parrett NP-C  [facility]  [phone]  2018/11/16

## 2018-11-24 NOTE — Progress Notes (Signed)
Unable to obtain temperature. Floyd notified. See new orders for bair hugger. Will continue to closely monitor.

## 2018-11-24 DEATH — deceased

## 2018-11-28 DIAGNOSIS — B961 Klebsiella pneumoniae [K. pneumoniae] as the cause of diseases classified elsewhere: Secondary | ICD-10-CM

## 2018-11-28 DIAGNOSIS — K7200 Acute and subacute hepatic failure without coma: Secondary | ICD-10-CM | POA: Diagnosis present

## 2018-11-28 DIAGNOSIS — G40901 Epilepsy, unspecified, not intractable, with status epilepticus: Secondary | ICD-10-CM | POA: Diagnosis not present

## 2018-11-28 DIAGNOSIS — G253 Myoclonus: Secondary | ICD-10-CM | POA: Diagnosis not present

## 2018-11-28 DIAGNOSIS — N390 Urinary tract infection, site not specified: Secondary | ICD-10-CM | POA: Diagnosis present

## 2018-11-28 DIAGNOSIS — B9689 Other specified bacterial agents as the cause of diseases classified elsewhere: Secondary | ICD-10-CM | POA: Diagnosis present

## 2018-12-25 NOTE — Death Summary Note (Signed)
  DEATH SUMMARY   Patient Details  Name: Angelica Beck MRN: [id number] DOB: [birth year]  Admission/Discharge Information   Admit Date:  11/02/2018  Date of Death: Date of Death: 2018-11-09  Time of Death: Time of Death: December 17, 1553  Length of Stay: 7  Referring Physician: Clovia Cuff, MD   Reason(s) for Hospitalization  Cardiopulmonary arrest  Diagnoses  Preliminary cause of death:   Acute hypoxemic encephalopathy  Secondary Diagnoses (including complications and co-morbidities):  Active Problems:   Hyperglycemia   CAD, NATIVE VESSEL   AKI (acute kidney injury) ([place])   Type 2 DM with CKD and hypertension (HCC)   Lactic acidosis   Acute encephalopathy   History of CVA (cerebrovascular accident)   Chronic systolic HF (heart failure) (HCC)   Essential hypertension   HCAP (healthcare-associated pneumonia)   Acute on chronic respiratory failure with hypoxia (HCC)   (HFpEF) heart failure with preserved ejection fraction (HCC)   At risk for aspiration   Stroke ([place])   Dementia (HCC)   Cardiac arrest ([place])   UTI due to Klebsiella species   Shock liver   Myoclonus   Status epilepticus [facility])   Brief Hospital Course (including significant findings, care, treatment, and services provided and events leading to death)  Angelica Beck is a 67 y.o. year old female with a history of vascular dementia, hypertension with associated chronic diastolic CHF, diabetes type 2, chronic kidney disease stage III, hypothyroidism, history of prior GI bleed, history of stroke with associated dysphasia, chronic hypoxemic respiratory failure on supplemental oxygen.  She was debilitated, bedbound and nonambulatory at her baseline.  She sustained a cardiac arrest at home while eating dinner, became unresponsive and CPR was initiated.  EMS arrived and about 10 minutes and the patient recovered a perfusing pulse after 1 round of CPR, estimated downtime 15 minutes.  In the emergency department she did not have  any evidence to support CAD or an acute coronary syndrome, her CT scan of the head was unremarkable, her urinalysis was positive and urine culture revealed Klebsiella.  Lab work consistent with acute on chronic renal failure, shock liver.  Chest x-ray after stabilization revealed left lower lobe atelectasis versus infiltrate consistent with a possible community-acquired versus aspiration pneumonia.  She was treated with antibiotics.  Therapeutic hypothermia was deferred given her underlying dementia and neurological deficits.  Course unfortunately complicated by myoclonus, status epilepticus, for which she was loaded on multiple antiepileptic medications as well as continuous sedating drips. EEG confirmed myoclonic status epilepticus.  She stabilized some but even after sedating drips were weaned to off she did not recover any meaningful neurological function.  It was felt that she had experienced a profound hypoxemic brain injury.  Discussions were undertaken with the patient's family and based on the poor prognosis for meaningful neurological recovery decision was made to transition her to comfort care and to withdrawal mechanical ventilator support.  This was done on 11-09-18 and the patient died on the same day with family at bedside.  Baltazar Apo, MD, PhD 11/28/2018, 3:42 PM [facility] [phone] or if no answer [phone]
